# Patient Record
Sex: Male | Born: 1945 | Race: White | Hispanic: No | Smoking: Former smoker
Health system: Southern US, Community
[De-identification: ages and names within clinical notes are randomized; demographics above are authoritative.]

## PROBLEM LIST (undated history)

## (undated) ENCOUNTER — Emergency Department (HOSPITAL_COMMUNITY): Admission: EM | Disposition: A | Discharge status: Home / Self Care | Payer: Medicare Other

## (undated) DIAGNOSIS — E119 Type 2 diabetes mellitus without complications: Secondary | ICD-10-CM

## (undated) DIAGNOSIS — E785 Hyperlipidemia, unspecified: Secondary | ICD-10-CM

## (undated) DIAGNOSIS — R55 Syncope and collapse: Secondary | ICD-10-CM

## (undated) DIAGNOSIS — K219 Gastro-esophageal reflux disease without esophagitis: Secondary | ICD-10-CM

## (undated) DIAGNOSIS — J189 Pneumonia, unspecified organism: Secondary | ICD-10-CM

## (undated) DIAGNOSIS — I495 Sick sinus syndrome: Secondary | ICD-10-CM

## (undated) DIAGNOSIS — M1711 Unilateral primary osteoarthritis, right knee: Secondary | ICD-10-CM

## (undated) DIAGNOSIS — K08109 Complete loss of teeth, unspecified cause, unspecified class: Secondary | ICD-10-CM

## (undated) DIAGNOSIS — Z972 Presence of dental prosthetic device (complete) (partial): Secondary | ICD-10-CM

## (undated) DIAGNOSIS — Z950 Presence of cardiac pacemaker: Secondary | ICD-10-CM

## (undated) DIAGNOSIS — Z8719 Personal history of other diseases of the digestive system: Secondary | ICD-10-CM

## (undated) DIAGNOSIS — I6529 Occlusion and stenosis of unspecified carotid artery: Secondary | ICD-10-CM

## (undated) DIAGNOSIS — I480 Paroxysmal atrial fibrillation: Secondary | ICD-10-CM

## (undated) DIAGNOSIS — Z412 Encounter for routine and ritual male circumcision: Secondary | ICD-10-CM

## (undated) DIAGNOSIS — K625 Hemorrhage of anus and rectum: Secondary | ICD-10-CM

## (undated) DIAGNOSIS — I4510 Unspecified right bundle-branch block: Secondary | ICD-10-CM

## (undated) DIAGNOSIS — I10 Essential (primary) hypertension: Secondary | ICD-10-CM

## (undated) DIAGNOSIS — M1990 Unspecified osteoarthritis, unspecified site: Secondary | ICD-10-CM

## (undated) DIAGNOSIS — I509 Heart failure, unspecified: Secondary | ICD-10-CM

## (undated) DIAGNOSIS — G4733 Obstructive sleep apnea (adult) (pediatric): Secondary | ICD-10-CM

## (undated) DIAGNOSIS — E039 Hypothyroidism, unspecified: Secondary | ICD-10-CM

## (undated) DIAGNOSIS — J449 Chronic obstructive pulmonary disease, unspecified: Secondary | ICD-10-CM

## (undated) DIAGNOSIS — I359 Nonrheumatic aortic valve disorder, unspecified: Secondary | ICD-10-CM

## (undated) DIAGNOSIS — I079 Rheumatic tricuspid valve disease, unspecified: Secondary | ICD-10-CM

## (undated) DIAGNOSIS — C801 Malignant (primary) neoplasm, unspecified: Secondary | ICD-10-CM

## (undated) HISTORY — PX: OTHER SURGICAL HISTORY: SHX169

## (undated) HISTORY — PX: HAND TENDON SURGERY: SHX663

## (undated) HISTORY — PX: CARDIAC CATHETERIZATION: SHX172

## (undated) HISTORY — DX: Sick sinus syndrome: I49.5

## (undated) HISTORY — DX: Encounter for routine and ritual male circumcision: Z41.2

## (undated) HISTORY — DX: Hyperlipidemia, unspecified: E78.5

## (undated) HISTORY — DX: Unspecified right bundle-branch block: I45.10

## (undated) HISTORY — DX: Obstructive sleep apnea (adult) (pediatric): G47.33

## (undated) HISTORY — DX: Nonrheumatic aortic valve disorder, unspecified: I35.9

## (undated) HISTORY — PX: TONSILLECTOMY: SUR1361

## (undated) HISTORY — DX: Hemorrhage of anus and rectum: K62.5

## (undated) HISTORY — PX: CIRCUMCISION: SUR203

## (undated) HISTORY — DX: Occlusion and stenosis of unspecified carotid artery: I65.29

## (undated) HISTORY — DX: Rheumatic tricuspid valve disease, unspecified: I07.9

## (undated) HISTORY — DX: Syncope and collapse: R55

## (undated) HISTORY — PX: EYE SURGERY: SHX253

## (undated) HISTORY — PX: A-V CARDIAC PACEMAKER INSERTION: SHX562

---

## 1992-12-29 HISTORY — PX: CHOLECYSTECTOMY: SHX55

## 1992-12-29 HISTORY — PX: CARPAL TUNNEL RELEASE: SHX101

## 1999-12-30 HISTORY — PX: OTHER SURGICAL HISTORY: SHX169

## 2001-12-29 HISTORY — PX: HEMORROIDECTOMY: SUR656

## 2003-12-30 HISTORY — PX: OTHER SURGICAL HISTORY: SHX169

## 2006-12-01 HISTORY — PX: GALLBLADDER SURGERY: SHX652

## 2008-09-28 HISTORY — PX: CARDIAC ELECTROPHYSIOLOGY STUDY AND ABLATION: SHX1294

## 2010-12-29 HISTORY — PX: EYE SURGERY: SHX253

## 2011-01-24 ENCOUNTER — Ambulatory Visit
Admission: RE | Admit: 2011-01-24 | Discharge: 2011-01-24 | Payer: Self-pay | Source: Home / Self Care | Attending: Internal Medicine | Admitting: Internal Medicine

## 2011-01-24 ENCOUNTER — Encounter: Payer: Self-pay | Admitting: Internal Medicine

## 2011-01-24 DIAGNOSIS — Z950 Presence of cardiac pacemaker: Secondary | ICD-10-CM | POA: Insufficient documentation

## 2011-01-24 DIAGNOSIS — I493 Ventricular premature depolarization: Secondary | ICD-10-CM | POA: Insufficient documentation

## 2011-01-24 DIAGNOSIS — I10 Essential (primary) hypertension: Secondary | ICD-10-CM | POA: Insufficient documentation

## 2011-01-30 NOTE — Letter (Signed)
Summary: records from Dr Ouida Sills  records from Dr Ouida Sills   Imported By: Faythe Ghee 01/24/2011 16:10:23  _____________________________________________________________________  External Attachment:    Type:   Image     Comment:   External Document

## 2011-01-30 NOTE — Assessment & Plan Note (Signed)
Summary: **PT NEW TO ARE[place] FROM [place] / NEEDS PACER CHECKED/TG   Visit Type:  Initial Consult Primary Provider:  Osborne Casco   History of Present Illness: Mr. Gunby is referred today by Dr. Ouida Sills for ongoing followup of symptomatic tachybrady, s/p PPM, RVOT PVC's s/p ablation and HTN. He also has dyslipidemia.  The patient also has copd. He has moved from [place] to [place]. He has done quite well.  Current Medications (verified): 1)  Norvasc 10 Mg Tabs (Amlodipine Besylate) .... Take 1 Tab Daily 2)  Synthroid 175 Mcg Tabs (Levothyroxine Sodium) .... Take 1 Tab Daily 3)  Metoprolol Tartrate 100 Mg Tabs (Metoprolol Tartrate) .... Take 1 Tab Two Times A Day 4)  Vytorin 10-80 Mg Tabs (Ezetimibe-Simvastatin) .... Take 1 Tab Daily 5)  Metformin Hcl 500 Mg Tabs (Metformin Hcl) .... Take2 Tab Two Times A Day 6)  Contour Test Strips 7)  Advair Diskus 250-50 Mcg/dose Aepb (Fluticasone-Salmeterol) .... Use Daily 8)  Demadex 20 Mg Tabs (Torsemide) .... Take 1 Tab Daily 9)  Starlix 120 Mg Tabs (Nateglinide) .... Take 1 Tab Two Times A Day 10)  Aspir-Low 81 Mg Tbec (Aspirin) .... Take 1 Tab Daily 11)  Daily Multi  Tabs (Multiple Vitamins-Minerals) .... Take 1 Tab Daily 12)  Aleve 220 Mg Tabs (Naproxen Sodium) .... Take  2 Am 2 Pm 13)  Tylenol 325 Mg Tabs (Acetaminophen) .... Take As Needed  Allergies (verified): 1)  ! Iodine 2)  ! * Bananas 3)  ! 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    Vital Signs:  Patient profile:   65 year old male Height:      71 inches Weight:      247 pounds BMI:     34.57 Pulse rate:   60 / minute BP sitting:   176 / 82  (left arm)  Vitals Entered By: Dreama Saa, CNA (January 24, 2011 8:17 AM)  Physical Exam  General:  Well developed, well nourished, in no acute distress.  HEENT: normal Neck: supple. No JVD. Carotids 2+ bilaterally no bruits Cor: RRR no rubs, gallops or murmur Lungs: CTA. Well healed PPM incision. Ab: soft, nontender. nondistended. No HSM. Good bowel sounds Ext: warm. no cyanosis, clubbing or edema Neuro: alert and oriented. Grossly nonfocal. affect pleasant    PPM Specifications Following MD:  Lewayne Bunting, MD     PPM Vendor:  [employer]     PPM Model Number:  [id number]     PPM Serial Number:  [id number] PPM DOI:  11/03/2004     PPM Implanting MD:  NOT IMPLANTED BY [place]  Lead 1    Location: [facility]     DOI: 11/03/2004     Model #: [id number]     Serial #: [id number]     Status: active Lead 2  Location: [facility]     DOI: 11/03/2004     Model #: [id number]     Serial #: [id number]     Status: active  Magnet Response Rate:  BOL 98.6 ERI 86.3  Indications:  Sick sinus syndrome   PPM Follow Up Remote Check?  No Battery Voltage:  2.74 V     Battery Est. Longevity:  1.25 years     Pacer Dependent:  No       PPM Device Measurements Atrium  Amplitude: 1.3 mV, Impedance: 364 ohms, Threshold: 0.75 V at 0.5 msec Right Ventricle  Amplitude: 4.8 mV, Impedance: 398 ohms, Threshold: 0.625 V at 0.5 msec  Episodes MS Episodes:  0     Percent Mode Switch:  0     Coumadin:  No Atrial Pacing:  84%      Ventricular Pacing:  30%  Parameters Mode:  DDDR     Lower Rate Limit:  60     Upper Rate Limit:  110 Paced AV Delay:  275     Sensed AV Delay:  250 Rate Response Parameters:  Auto -0.5 Next Cardiology Appt Due:  06/29/2011 Tech Comments:  Ventricular autocapture and rate response reprogrammed today.  We will set up TTM's with [employer].  ROV 6 months RDS clinic. Altha Harm, LPN  January 24, 2011 8:43 AM  MD Comments:  Agree with above.  Impression & Recommendations:  Problem # 1:  CARDIAC PACEMAKER IN SITU (ICD-V45.01) HIs current device is working normally. Will recheck in several months.  Problem # 2:  ESSENTIAL HYPERTENSION, BENIGN (ICD-401.1) His pressure is elevated. He has not yet taken his morning meds. I have asked him to maintain a low sodium diet. His updated medication list for this problem includes:    Norvasc 10 Mg Tabs (Amlodipine besylate) .Marland Kitchen... Take 1 tab daily    Metoprolol Tartrate 100 Mg Tabs (Metoprolol tartrate) .Marland Kitchen... Take 1 tab two times a day    Demadex 20 Mg Tabs (Torsemide) .Marland Kitchen... Take 1 tab daily    Aspir-low 81 Mg Tbec (Aspirin) .Marland Kitchen... Take 1 tab daily  Problem # 3:  PREMATURE VENTRICULAR CONTRACTIONS (ICD-427.69) He is s/p ablation. Interogation of his PPM demonstrated less than one percent pvc's. Will follow. His updated medication list for this problem includes:    Norvasc 10 Mg Tabs (Amlodipine besylate) .Marland Kitchen... Take 1 tab daily    Metoprolol Tartrate 100 Mg Tabs (Metoprolol tartrate) .Marland Kitchen... Take 1 tab two times a day    Aspir-low 81 Mg Tbec (Aspirin) .Marland Kitchen... Take 1 tab daily  Patient Instructions: 1)  Your physician recommends that you schedule a follow-up appointment in: 6 months 2)  Your physician recommends that you continue on your current medications as directed. Please refer to the Current Medication list given to you today.

## 2011-04-16 ENCOUNTER — Ambulatory Visit
Admission: RE | Admit: 2011-04-16 | Discharge: 2011-04-16 | Disposition: A | Discharge status: Home / Self Care | Payer: Medicare Other | Source: Ambulatory Visit | Attending: Orthopedic Surgery | Admitting: Orthopedic Surgery

## 2011-04-16 ENCOUNTER — Other Ambulatory Visit: Payer: Self-pay | Admitting: Orthopedic Surgery

## 2011-04-16 ENCOUNTER — Encounter (HOSPITAL_BASED_OUTPATIENT_CLINIC_OR_DEPARTMENT_OTHER)
Admission: RE | Admit: 2011-04-16 | Discharge: 2011-04-16 | Disposition: A | Discharge status: Home / Self Care | Payer: Medicare Other | Source: Ambulatory Visit | Attending: Orthopedic Surgery | Admitting: Orthopedic Surgery

## 2011-04-16 DIAGNOSIS — Z01811 Encounter for preprocedural respiratory examination: Secondary | ICD-10-CM

## 2011-04-16 LAB — BASIC METABOLIC PANEL
CO2: 30 mEq/L (ref 19–32)
Calcium: 9.2 mg/dL (ref 8.4–10.5)
Creatinine, Ser: 1.12 mg/dL (ref 0.4–1.5)
Glucose, Bld: 150 mg/dL — ABNORMAL HIGH (ref 70–99)

## 2011-04-21 ENCOUNTER — Ambulatory Visit (HOSPITAL_BASED_OUTPATIENT_CLINIC_OR_DEPARTMENT_OTHER)
Admission: RE | Admit: 2011-04-21 | Discharge: 2011-04-21 | Disposition: A | Discharge status: Home / Self Care | Payer: Medicare Other | Source: Ambulatory Visit | Attending: Orthopedic Surgery | Admitting: Orthopedic Surgery

## 2011-04-21 DIAGNOSIS — J45909 Unspecified asthma, uncomplicated: Secondary | ICD-10-CM | POA: Insufficient documentation

## 2011-04-21 DIAGNOSIS — Z01812 Encounter for preprocedural laboratory examination: Secondary | ICD-10-CM | POA: Insufficient documentation

## 2011-04-21 DIAGNOSIS — E119 Type 2 diabetes mellitus without complications: Secondary | ICD-10-CM | POA: Insufficient documentation

## 2011-04-21 DIAGNOSIS — M23329 Other meniscus derangements, posterior horn of medial meniscus, unspecified knee: Secondary | ICD-10-CM | POA: Insufficient documentation

## 2011-04-21 DIAGNOSIS — M659 Unspecified synovitis and tenosynovitis, unspecified site: Secondary | ICD-10-CM | POA: Insufficient documentation

## 2011-04-21 DIAGNOSIS — M23359 Other meniscus derangements, posterior horn of lateral meniscus, unspecified knee: Secondary | ICD-10-CM | POA: Insufficient documentation

## 2011-04-21 DIAGNOSIS — M2240 Chondromalacia patellae, unspecified knee: Secondary | ICD-10-CM | POA: Insufficient documentation

## 2011-04-21 DIAGNOSIS — I4891 Unspecified atrial fibrillation: Secondary | ICD-10-CM | POA: Insufficient documentation

## 2011-04-21 DIAGNOSIS — I10 Essential (primary) hypertension: Secondary | ICD-10-CM | POA: Insufficient documentation

## 2011-04-29 ENCOUNTER — Other Ambulatory Visit (HOSPITAL_COMMUNITY): Payer: Self-pay | Admitting: Internal Medicine

## 2011-04-29 DIAGNOSIS — R0989 Other specified symptoms and signs involving the circulatory and respiratory systems: Secondary | ICD-10-CM

## 2011-04-30 ENCOUNTER — Telehealth: Payer: Self-pay | Admitting: Internal Medicine

## 2011-04-30 NOTE — Telephone Encounter (Signed)
Pt was told at last visit with Ladona Ridgel that someone would be setting him up to do phone checks. Pt calling to see what happened with that.

## 2011-04-30 NOTE — Telephone Encounter (Signed)
Patient enrolled with [employer] for TTM's to be done quarterly.  Spoke with wife and asked her to contact us if he does not get a schedule of dates within the next 2 weeks.

## 2011-05-01 ENCOUNTER — Ambulatory Visit: Payer: Medicare Other | Admitting: Adult Health

## 2011-05-05 ENCOUNTER — Ambulatory Visit (HOSPITAL_COMMUNITY)
Admission: RE | Admit: 2011-05-05 | Discharge: 2011-05-05 | Disposition: A | Discharge status: Home / Self Care | Payer: Medicare Other | Source: Ambulatory Visit | Attending: Internal Medicine | Admitting: Internal Medicine

## 2011-05-05 DIAGNOSIS — R0989 Other specified symptoms and signs involving the circulatory and respiratory systems: Secondary | ICD-10-CM | POA: Insufficient documentation

## 2011-05-20 NOTE — Op Note (Signed)
NAME:  [name], Clarence Dawson NO.:  [id number]  MEDICAL RECORD NO.:  [id number]           PATIENT TYPE:  LOCATION:                                 FACILITY:  PHYSICIAN:  Elana Alm. Thurston Hole, M.D.      DATE OF BIRTH:  DATE OF PROCEDURE:  04/21/2011 DATE OF DISCHARGE:                              OPERATIVE REPORT   PREOPERATIVE DIAGNOSIS:  Left knee medial and lateral meniscal tears with chondromalacia and synovitis.  POSTOPERATIVE DIAGNOSIS:  Left knee medial and lateral meniscal tears with chondromalacia and synovitis.  PROCEDURES: 1. Left knee examination under anesthesia followed by arthroscopic     partial medial and lateral meniscectomies. 2. Left knee chondroplasty with partial synovectomy.  SURGEON:  Elana Alm. Thurston Hole, MD  ASSISTANT:  None.  ANESTHESIA:  General.  OPERATIVE TIME:  30 minutes.  COMPLICATIONS:  None.  INDICATIONS FOR PROCEDURE:  [name] is a 64 year old gentleman who has had significant left knee pain with a twisting injury to his left knee that occurred when he got out of his truck in January 2012.  Since that time, he has had significant pain with exam and x-rays consistent with medial meniscus tear.  He has failed conservative care and is now to undergo arthroscopy.  DESCRIPTION:  [name] was brought to the operating room on April 21, 2011, after knee block was placed in the holding room by Anesthesia.  He was placed on the operating table in supine position.  He received Ancef 1 g IV preoperatively for prophylaxis.  His left knee was examined under anesthesia.  He had full range of motion of knee with stable ligamentous exam with normal patellar tracking.  Left leg was prepped using sterile DuraPrep and draped using sterile technique.  Originally, through an anterolateral portal, the arthroscope with a pump attached was placed into an anteromedial portal and arthroscopic probe was placed.  On initial inspection of medial  compartment, he had a distinct 50-60% grade 3 chondral lesion on the medial femoral condyle which was debrided down to a very thin base and a stable rim.  Medial tibial plateau showed grade 1 and 2 changes.  He had a complex tear of the posterior medial horn of the medial meniscus of which 50% was resected back to a stable rim.  Anterior horn was intact.  Intercondylar notch was inspected. Anteroposterior cruciate ligaments were normal.  Lateral compartment was inspected.  He had grade 1 and 2 chondromalacia, lateral meniscus tear, posterior and lateral horn of which 25% was resected back to a stable rim.  Patellofemoral joint showed grade 1 and 2 chondromalacia.  The patella tracked normally.  Moderate synovitis and medial and lateral gutters were debrided, otherwise this was free of pathology.  After this was done, it was felt that all pathology had been satisfactorily addressed.  The instruments were removed.  Portals were closed with 3-0 nylon suture.  Sterile dressings were applied and the patient awakened and taken to the recovery room in stable condition.  FOLLOWUP CARE:  Mr. Zeiner will be followed as an outpatient on Norco for pain.  He will be  seen back in the office in a week for sutures out and followup.     Dareld Mcauliffe A. Thurston Hole, M.D.     RAW/MEDQ  D:  04/21/2011  T:  04/22/2011  Job:  [id number]  Electronically Signed by Salvatore Marvel M.D. on 05/20/2011 05:19:30 PM

## 2011-06-04 ENCOUNTER — Encounter: Payer: Self-pay | Admitting: Internal Medicine

## 2011-06-04 DIAGNOSIS — I495 Sick sinus syndrome: Secondary | ICD-10-CM

## 2011-07-10 ENCOUNTER — Encounter: Payer: Self-pay | Admitting: Internal Medicine

## 2011-07-17 ENCOUNTER — Encounter: Payer: Self-pay | Admitting: Internal Medicine

## 2011-07-17 ENCOUNTER — Ambulatory Visit (INDEPENDENT_AMBULATORY_CARE_PROVIDER_SITE_OTHER): Payer: Medicare Other | Admitting: Internal Medicine

## 2011-07-17 DIAGNOSIS — I498 Other specified cardiac arrhythmias: Secondary | ICD-10-CM

## 2011-07-17 DIAGNOSIS — Z950 Presence of cardiac pacemaker: Secondary | ICD-10-CM

## 2011-07-17 DIAGNOSIS — I4949 Other premature depolarization: Secondary | ICD-10-CM

## 2011-07-17 DIAGNOSIS — I10 Essential (primary) hypertension: Secondary | ICD-10-CM

## 2011-07-17 DIAGNOSIS — I493 Ventricular premature depolarization: Secondary | ICD-10-CM

## 2011-07-17 LAB — PACEMAKER DEVICE OBSERVATION
AL AMPLITUDE: 1.4 mv
ATRIAL PACING PM: 83
BAMS-0001: 180 {beats}/min
BATTERY VOLTAGE: 2.75 V
RV LEAD AMPLITUDE: 6.5 mv
VENTRICULAR PACING PM: 1

## 2011-07-17 NOTE — Progress Notes (Signed)
HPI [name] returns today for followup. He is a very pleasant 65 year old man from [place] who has a history of symptomatic bradycardia and is status post permanent pacemaker insertion. He has a history of symptomatic PVCs and underwent catheter ablation several years ago. He continues to do well. He denies chest pain, shortness of breath, or peripheral edema. He denies syncope. Allergies  Allergen Reactions  . Iodine      Current Outpatient Prescriptions  Medication Sig Dispense Refill  . acetaminophen (TYLENOL) 325 MG tablet Take 650 mg by mouth every 6 (six) hours as needed.        Marland Kitchen amLODipine (NORVASC) 10 MG tablet Take 10 mg by mouth daily.        Marland Kitchen aspirin 81 MG tablet Take 81 mg by mouth daily.        Marland Kitchen ezetimibe-simvastatin (VYTORIN) 10-80 MG per tablet Take 1 tablet by mouth at bedtime.        . Fluticasone-Salmeterol (ADVAIR DISKUS) 250-50 MCG/DOSE AEPB Inhale 1 puff into the lungs every 12 (twelve) hours.        Marland Kitchen glucose blood test strip 1 each by Other route as needed. Use as instructed       . levothyroxine (SYNTHROID, LEVOTHROID) 175 MCG tablet Take 175 mcg by mouth daily.        . metFORMIN (GLUCOPHAGE) 500 MG tablet Take 1,000 mg by mouth 2 (two) times daily with a meal.        . metoprolol (LOPRESSOR) 100 MG tablet Take 100 mg by mouth 2 (two) times daily.        . Multiple Vitamin (MULTIVITAMIN) tablet Take 1 tablet by mouth daily.        . naproxen sodium (ANAPROX) 220 MG tablet Take 2 tablets in the morning and 2 tablets in the evening       . nateglinide (STARLIX) 120 MG tablet Take 120 mg by mouth 2 (two) times daily.        . ramipril (ALTACE) 2.5 MG capsule Take 2.5 mg by mouth daily.       Marland Kitchen torsemide (DEMADEX) 20 MG tablet Take 20 mg by mouth daily.           Past Medical History  Diagnosis Date  . Hypertension   . Allergic rhinitis   . Atrial fibrillation   . Syncope   . Sinoatrial node dysfunction   . Hyperthyroidism   . 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  Hyperlipidemia   . RBBB (right bundle branch block)     Coronary Artherosclerosis  . Tricuspid valve disorder   . OSA (obstructive sleep apnea)   . Aortic valve disorder   . Carotid artery stenosis   . Diabetes mellitus   . Hemorrhage of rectum     ROS:   All systems reviewed and negative except as noted in the HPI.   Past Surgical History  Procedure Date  . Gallbladder surgery 12/01/2006  . Effusion of joint 01/20/2007    Left Elbow  . Rotator cuff surgery 2001    Right shoulder  . Carpal tunnel release 1994    right wrist  . Arthropathy 2005    Rebuilding of left thumb and joint   . Hemorroidectomy 2003  . Circumcision   . A-v cardiac pacemaker insertion     Sick sinus syndrome DDR pacer  . Cardiac electrophysiology study and ablation 09/2008    for pvcs, Dr. Vesta Mixer     Family History  Problem Relation Age  of Onset  . Other Father 76    Sudden Cardiac death  . Pancreatic cancer Mother      History   Social History  . Marital Status: Married    Spouse Name: N/A    Number of Children: N/A  . Years of Education: N/A   Occupational History  . Not on file.   Social History Main Topics  . Smoking status: Former Smoker -- 1.0 packs/day for 25 years    Quit date: 12/30/1983  . Smokeless tobacco: Not on file  . Alcohol Use: Yes     Social  . Drug Use: No  . Sexually Active: Not on file   Other Topics Concern  . Not on file   Social History Narrative   Regular exercise: No     BP 172/81  Pulse 59  Ht 5\' 11"  (1.803 m)  Wt 241 lb (109.317 kg)  BMI 33.61 kg/m2  Physical Exam:  Well appearing NAD HEENT: Unremarkable Neck:  No JVD, no thyromegally Lymphatics:  No adenopathy Back:  No CVA tenderness Lungs:  Clear. Well-healed pacemaker incision. HEART:  Regular rate rhythm, no murmurs, no rubs, no clicks Abd:  soft, positive bowel sounds, no organomegally, no rebound, no guarding Ext:  2 plus pulses, no edema, no cyanosis, no clubbing Skin:   No rashes no nodules Neuro:  CN II through XII intact, motor grossly intact  DEVICE  Normal device function.  See PaceArt for details.   Assess/Plan:

## 2011-07-17 NOTE — Assessment & Plan Note (Signed)
His device is working normally. He is 1-2 years away from generator replacement

## 2011-07-17 NOTE — Assessment & Plan Note (Signed)
His blood pressure was elevated today but he notes that he did not take all of his morning medications. He does check his blood pressure on a regular basis and states it has been well controlled.

## 2011-07-17 NOTE — Assessment & Plan Note (Signed)
His symptoms appear to be well controlled. Will undergo a period of watchful waiting

## 2011-07-17 NOTE — Patient Instructions (Signed)
Your physician recommends that you schedule a follow-up appointment in:1 year Dr. Ladona Ridgel

## 2011-09-04 ENCOUNTER — Encounter: Payer: Self-pay | Admitting: Internal Medicine

## 2011-09-04 DIAGNOSIS — I495 Sick sinus syndrome: Secondary | ICD-10-CM

## 2011-12-04 ENCOUNTER — Encounter: Payer: Self-pay | Admitting: Internal Medicine

## 2011-12-04 DIAGNOSIS — I495 Sick sinus syndrome: Secondary | ICD-10-CM

## 2012-01-21 DIAGNOSIS — Z9889 Other specified postprocedural states: Secondary | ICD-10-CM | POA: Diagnosis not present

## 2012-01-21 DIAGNOSIS — Z947 Corneal transplant status: Secondary | ICD-10-CM | POA: Diagnosis not present

## 2012-03-08 DIAGNOSIS — E039 Hypothyroidism, unspecified: Secondary | ICD-10-CM | POA: Diagnosis not present

## 2012-03-08 DIAGNOSIS — E785 Hyperlipidemia, unspecified: Secondary | ICD-10-CM | POA: Diagnosis not present

## 2012-03-08 DIAGNOSIS — I10 Essential (primary) hypertension: Secondary | ICD-10-CM | POA: Diagnosis not present

## 2012-03-08 DIAGNOSIS — E119 Type 2 diabetes mellitus without complications: Secondary | ICD-10-CM | POA: Diagnosis not present

## 2012-03-10 DIAGNOSIS — Z9889 Other specified postprocedural states: Secondary | ICD-10-CM | POA: Diagnosis not present

## 2012-03-10 DIAGNOSIS — H17819 Minor opacity of cornea, unspecified eye: Secondary | ICD-10-CM | POA: Diagnosis not present

## 2012-03-10 DIAGNOSIS — Z947 Corneal transplant status: Secondary | ICD-10-CM | POA: Diagnosis not present

## 2012-03-11 ENCOUNTER — Encounter: Payer: Self-pay | Admitting: Internal Medicine

## 2012-03-11 DIAGNOSIS — I495 Sick sinus syndrome: Secondary | ICD-10-CM | POA: Diagnosis not present

## 2012-03-16 DIAGNOSIS — I10 Essential (primary) hypertension: Secondary | ICD-10-CM | POA: Diagnosis not present

## 2012-03-16 DIAGNOSIS — E119 Type 2 diabetes mellitus without complications: Secondary | ICD-10-CM | POA: Diagnosis not present

## 2012-03-16 DIAGNOSIS — R21 Rash and other nonspecific skin eruption: Secondary | ICD-10-CM | POA: Diagnosis not present

## 2012-03-16 DIAGNOSIS — E785 Hyperlipidemia, unspecified: Secondary | ICD-10-CM | POA: Diagnosis not present

## 2012-03-22 DIAGNOSIS — G5600 Carpal tunnel syndrome, unspecified upper limb: Secondary | ICD-10-CM | POA: Diagnosis not present

## 2012-03-22 DIAGNOSIS — M19049 Primary osteoarthritis, unspecified hand: Secondary | ICD-10-CM | POA: Diagnosis not present

## 2012-04-05 DIAGNOSIS — G5600 Carpal tunnel syndrome, unspecified upper limb: Secondary | ICD-10-CM | POA: Diagnosis not present

## 2012-04-12 DIAGNOSIS — M1710 Unilateral primary osteoarthritis, unspecified knee: Secondary | ICD-10-CM | POA: Diagnosis not present

## 2012-04-12 DIAGNOSIS — M25569 Pain in unspecified knee: Secondary | ICD-10-CM | POA: Diagnosis not present

## 2012-04-20 DIAGNOSIS — M1710 Unilateral primary osteoarthritis, unspecified knee: Secondary | ICD-10-CM | POA: Diagnosis not present

## 2012-04-26 DIAGNOSIS — G5600 Carpal tunnel syndrome, unspecified upper limb: Secondary | ICD-10-CM | POA: Diagnosis not present

## 2012-04-27 DIAGNOSIS — M1710 Unilateral primary osteoarthritis, unspecified knee: Secondary | ICD-10-CM | POA: Diagnosis not present

## 2012-04-28 ENCOUNTER — Other Ambulatory Visit: Payer: Self-pay | Admitting: Orthopedic Surgery

## 2012-04-30 ENCOUNTER — Encounter (HOSPITAL_BASED_OUTPATIENT_CLINIC_OR_DEPARTMENT_OTHER): Payer: Self-pay | Admitting: *Deleted

## 2012-04-30 NOTE — Progress Notes (Addendum)
Bring all medications. Bring CPAP machine- Pt has not used in 2-3 months. Requested Sleep Study from Dr. Ilean Skill office- pt had done in [place] and place is no longer in business, but chart from [place] at Dr Alonza Smoker. Faxed implanted cardiac device  Sheet to [place]  [facility] for Dr. Ladona Ridgel to fill out. Coming in Monday for BMET and EKG.

## 2012-04-30 NOTE — Progress Notes (Signed)
Dr. Alonza Smoker office called back could not find Sleep Study from [place] in pt's records.

## 2012-05-03 NOTE — Progress Notes (Signed)
Pt coming 30 mins early day of surgery for labs- Bmet and EKG- could not find EKG done at Dr. Lubertha Basque office.

## 2012-05-04 ENCOUNTER — Ambulatory Visit (HOSPITAL_BASED_OUTPATIENT_CLINIC_OR_DEPARTMENT_OTHER)
Admission: RE | Admit: 2012-05-04 | Discharge: 2012-05-04 | Disposition: A | Discharge status: Home / Self Care | Payer: Medicare Other | Source: Ambulatory Visit | Attending: Orthopedic Surgery | Admitting: Orthopedic Surgery

## 2012-05-04 ENCOUNTER — Encounter (HOSPITAL_BASED_OUTPATIENT_CLINIC_OR_DEPARTMENT_OTHER): Payer: Self-pay | Admitting: Anesthesiology

## 2012-05-04 ENCOUNTER — Other Ambulatory Visit: Payer: Self-pay

## 2012-05-04 ENCOUNTER — Ambulatory Visit (HOSPITAL_BASED_OUTPATIENT_CLINIC_OR_DEPARTMENT_OTHER): Payer: Medicare Other | Admitting: Anesthesiology

## 2012-05-04 ENCOUNTER — Encounter (HOSPITAL_BASED_OUTPATIENT_CLINIC_OR_DEPARTMENT_OTHER)
Admission: RE | Disposition: A | Discharge status: Home / Self Care | Payer: Self-pay | Source: Ambulatory Visit | Attending: Orthopedic Surgery

## 2012-05-04 ENCOUNTER — Encounter (HOSPITAL_BASED_OUTPATIENT_CLINIC_OR_DEPARTMENT_OTHER): Payer: Self-pay | Admitting: Orthopedic Surgery

## 2012-05-04 DIAGNOSIS — G5600 Carpal tunnel syndrome, unspecified upper limb: Secondary | ICD-10-CM | POA: Insufficient documentation

## 2012-05-04 DIAGNOSIS — G4733 Obstructive sleep apnea (adult) (pediatric): Secondary | ICD-10-CM | POA: Insufficient documentation

## 2012-05-04 DIAGNOSIS — E119 Type 2 diabetes mellitus without complications: Secondary | ICD-10-CM | POA: Diagnosis not present

## 2012-05-04 DIAGNOSIS — I10 Essential (primary) hypertension: Secondary | ICD-10-CM | POA: Diagnosis not present

## 2012-05-04 DIAGNOSIS — K219 Gastro-esophageal reflux disease without esophagitis: Secondary | ICD-10-CM | POA: Insufficient documentation

## 2012-05-04 HISTORY — DX: Presence of cardiac pacemaker: Z95.0

## 2012-05-04 HISTORY — PX: CARPAL TUNNEL RELEASE: SHX101

## 2012-05-04 LAB — POCT I-STAT, CHEM 8
Calcium, Ion: 1.08 mmol/L — ABNORMAL LOW (ref 1.12–1.32)
Chloride: 108 mEq/L (ref 96–112)
Glucose, Bld: 140 mg/dL — ABNORMAL HIGH (ref 70–99)
HCT: 48 % (ref 39.0–52.0)
Hemoglobin: 16.3 g/dL (ref 13.0–17.0)
Potassium: 3.8 mEq/L (ref 3.5–5.1)

## 2012-05-04 LAB — GLUCOSE, CAPILLARY: Glucose-Capillary: 137 mg/dL — ABNORMAL HIGH (ref 70–99)

## 2012-05-04 SURGERY — CARPAL TUNNEL RELEASE
Anesthesia: Monitor Anesthesia Care | Site: Hand | Laterality: Left | Wound class: Clean

## 2012-05-04 MED ORDER — CEFAZOLIN SODIUM 1-5 GM-% IV SOLN
INTRAVENOUS | Status: DC | PRN
  Administered 2012-05-04: 2 g via INTRAVENOUS

## 2012-05-04 MED ORDER — MIDAZOLAM HCL 5 MG/5ML IJ SOLN
INTRAMUSCULAR | Status: DC | PRN
  Administered 2012-05-04: 2 mg via INTRAVENOUS

## 2012-05-04 MED ORDER — BUPIVACAINE HCL (PF) 0.25 % IJ SOLN
INTRAMUSCULAR | Status: DC | PRN
  Administered 2012-05-04: 6 mL

## 2012-05-04 MED ORDER — FENTANYL CITRATE 0.05 MG/ML IJ SOLN
INTRAMUSCULAR | Status: DC | PRN
  Administered 2012-05-04: 100 ug via INTRAVENOUS

## 2012-05-04 MED ORDER — CHLORHEXIDINE GLUCONATE 4 % EX LIQD: 60.0000 mL | Freq: Once | CUTANEOUS | Status: DC

## 2012-05-04 MED ORDER — FENTANYL CITRATE 0.05 MG/ML IJ SOLN: 25.0000 ug | INTRAMUSCULAR | Status: DC | PRN

## 2012-05-04 MED ORDER — LIDOCAINE HCL (CARDIAC) 20 MG/ML IV SOLN
INTRAVENOUS | Status: DC | PRN
  Administered 2012-05-04: 30 mg via INTRAVENOUS

## 2012-05-04 MED ORDER — LACTATED RINGERS IV SOLN
INTRAVENOUS | Status: DC
  Administered 2012-05-04: 08:00:00 via INTRAVENOUS

## 2012-05-04 MED ORDER — ONDANSETRON HCL 4 MG/2ML IJ SOLN
INTRAMUSCULAR | Status: DC | PRN
  Administered 2012-05-04: 4 mg via INTRAVENOUS

## 2012-05-04 MED ORDER — HYDROCODONE-ACETAMINOPHEN 5-500 MG PO TABS: 1.0000 | ORAL_TABLET | ORAL | Status: AC | PRN

## 2012-05-04 MED ORDER — PROPOFOL 10 MG/ML IV EMUL
INTRAVENOUS | Status: DC | PRN
  Administered 2012-05-04: 100 ug/kg/min via INTRAVENOUS

## 2012-05-04 SURGICAL SUPPLY — 36 items
BANDAGE GAUZE ELAST BULKY 4 IN (GAUZE/BANDAGES/DRESSINGS) ×2 IMPLANT
BLADE SURG 15 STRL LF DISP TIS (BLADE) ×1 IMPLANT
BLADE SURG 15 STRL SS (BLADE) ×1
BNDG COHESIVE 3X5 TAN STRL LF (GAUZE/BANDAGES/DRESSINGS) ×2 IMPLANT
BNDG ESMARK 4X9 LF (GAUZE/BANDAGES/DRESSINGS) IMPLANT
CHLORAPREP W/TINT 26ML (MISCELLANEOUS) ×2 IMPLANT
CLOTH BEACON ORANGE TIMEOUT ST (SAFETY) ×2 IMPLANT
CORDS BIPOLAR (ELECTRODE) ×2 IMPLANT
COVER MAYO STAND STRL (DRAPES) ×2 IMPLANT
COVER TABLE BACK 60X90 (DRAPES) ×2 IMPLANT
CUFF TOURNIQUET SINGLE 18IN (TOURNIQUET CUFF) ×2 IMPLANT
DRAPE EXTREMITY T 121X128X90 (DRAPE) ×2 IMPLANT
DRAPE SURG 17X23 STRL (DRAPES) ×2 IMPLANT
DRSG KUZMA FLUFF (GAUZE/BANDAGES/DRESSINGS) ×2 IMPLANT
GAUZE XEROFORM 1X8 LF (GAUZE/BANDAGES/DRESSINGS) ×2 IMPLANT
GLOVE BIO SURGEON STRL SZ 6.5 (GLOVE) ×2 IMPLANT
GLOVE BIO SURGEON STRL SZ7 (GLOVE) ×2 IMPLANT
GLOVE BIO SURGEON STRL SZ7.5 (GLOVE) ×2 IMPLANT
GLOVE SURG ORTHO 8.0 STRL STRW (GLOVE) ×2 IMPLANT
GOWN BRE IMP PREV XXLGXLNG (GOWN DISPOSABLE) ×2 IMPLANT
GOWN PREVENTION PLUS XLARGE (GOWN DISPOSABLE) ×2 IMPLANT
NEEDLE 27GAX1X1/2 (NEEDLE) ×2 IMPLANT
NS IRRIG 1000ML POUR BTL (IV SOLUTION) ×2 IMPLANT
PACK BASIN DAY SURGERY FS (CUSTOM PROCEDURE TRAY) ×2 IMPLANT
PAD CAST 3X4 CTTN HI CHSV (CAST SUPPLIES) ×1 IMPLANT
PADDING CAST ABS 4INX4YD NS (CAST SUPPLIES) ×1
PADDING CAST ABS COTTON 4X4 ST (CAST SUPPLIES) ×1 IMPLANT
PADDING CAST COTTON 3X4 STRL (CAST SUPPLIES) ×1
SPONGE GAUZE 4X4 12PLY (GAUZE/BANDAGES/DRESSINGS) ×2 IMPLANT
STOCKINETTE 4X48 STRL (DRAPES) ×2 IMPLANT
SUT VICRYL 4-0 PS2 18IN ABS (SUTURE) IMPLANT
SUT VICRYL RAPIDE 4/0 PS 2 (SUTURE) ×2 IMPLANT
SYR BULB 3OZ (MISCELLANEOUS) ×2 IMPLANT
SYR CONTROL 10ML LL (SYRINGE) ×2 IMPLANT
TOWEL OR 17X24 6PK STRL BLUE (TOWEL DISPOSABLE) ×2 IMPLANT
UNDERPAD 30X30 INCONTINENT (UNDERPADS AND DIAPERS) ×2 IMPLANT

## 2012-05-04 NOTE — Brief Op Note (Signed)
05/04/2012  10:02 AM  PATIENT:  Clarence Dawson  66 y.o. male  PRE-OPERATIVE DIAGNOSIS:  left carpal tunnel syndrome  POST-OPERATIVE DIAGNOSIS:  left carpal tunnel syndrome  PROCEDURE:  Procedure(s) (LRB): CARPAL TUNNEL RELEASE (Left)  SURGEON:  Surgeon(s) and Role:    * Nicki Reaper, MD - Primary    * Tami Ribas, MD - Assisting  PHYSICIAN ASSISTANT:   ASSISTANTS: Karlyn Agee MD   ANESTHESIA:   local and regional  EBL:  Total I/O In: 700 [I.V.:700] Out: -   BLOOD ADMINISTERED:none  DRAINS: none   LOCAL MEDICATIONS USED:  MARCAINE     SPECIMEN:  No Specimen  DISPOSITION OF SPECIMEN:  N/A  COUNTS:  YES  TOURNIQUET:   Total Tourniquet Time Documented: Forearm (laterality) - 20 minutes  DICTATION: .Other Dictation: Dictation Number [id number]  PLAN OF CARE: Discharge to home after PACU  PATIENT DISPOSITION:  PACU - hemodynamically stable.

## 2012-05-04 NOTE — Progress Notes (Signed)
Pt states he has shellfish allergy.

## 2012-05-04 NOTE — Anesthesia Postprocedure Evaluation (Signed)
  Anesthesia Post-op Note  Patient: Clarence Dawson  Procedure(s) Performed: Procedure(s) (LRB): CARPAL TUNNEL RELEASE (Left)  Patient Location: PACU  Anesthesia Type: Bier block  Level of Consciousness: awake, alert  and oriented  Airway and Oxygen Therapy: Patient Spontanous Breathing  Post-op Pain: none  Post-op Assessment: Post-op Vital signs reviewed, Patient's Cardiovascular Status Stable, Respiratory Function Stable, Patent Airway, No signs of Nausea or vomiting and Pain level controlled  Post-op Vital Signs: Reviewed and stable  Complications: No apparent anesthesia complications

## 2012-05-04 NOTE — Anesthesia Preprocedure Evaluation (Signed)
Anesthesia Evaluation  Patient identified by MRN, date of birth, ID band Patient awake    Reviewed: Allergy & Precautions, H&P , NPO status , Patient's Chart, lab work & pertinent test results, reviewed documented beta blocker date and time   History of Anesthesia Complications Negative for: history of anesthetic complications  Airway Mallampati: I TM Distance: >3 FB Neck ROM: Full    Dental  (+) Edentulous Upper and Edentulous Lower   Pulmonary asthma (well controlled) , sleep apnea (does not use his CPAP) ,  breath sounds clear to auscultation  Pulmonary exam normal       Cardiovascular hypertension, + dysrhythmias (S/P ablation for PVCs ) + pacemaker (for tachy-brady) Rhythm:Regular Rate:Normal  Cath '05: minimal non-obstructive ASCADz, EF 60% with normal LVF   Neuro/Psych negative neurological ROS     GI/Hepatic Neg liver ROS, GERD-  Medicated and Controlled,  Endo/Other  Diabetes mellitus- (glu 140), Well Controlled, Type 2, Oral Hypoglycemic AgentsMorbid obesity  Renal/GU negative Renal ROS     Musculoskeletal   Abdominal (+) + obese,   Peds  Hematology   Anesthesia Other Findings   Reproductive/Obstetrics                           Anesthesia Physical Anesthesia Plan  ASA: III  Anesthesia Plan: MAC and Bier Block   Post-op Pain Management:    Induction:   Airway Management Planned: Simple Face Mask  Additional Equipment:   Intra-op Plan:   Post-operative Plan:   Informed Consent: I have reviewed the patients History and Physical, chart, labs and discussed the procedure including the risks, benefits and alternatives for the proposed anesthesia with the patient or authorized representative who has indicated his/her understanding and acceptance.     Plan Discussed with: CRNA and Surgeon  Anesthesia Plan Comments: (Plan routine monitors, IV Regional )        Anesthesia  Quick Evaluation

## 2012-05-04 NOTE — Anesthesia Procedure Notes (Signed)
Procedure Name: MAC Date/Time: 05/04/2012 9:40 AM Performed by: Caren Macadam Pre-anesthesia Checklist: Patient identified, Emergency Drugs available, Suction available and Patient being monitored Patient Re-evaluated:Patient Re-evaluated prior to inductionOxygen Delivery Method: Simple face mask

## 2012-05-04 NOTE — Discharge Instructions (Signed)
[facility] Instructions Hand Surgery  Wound Care: Keep your hand elevated above the level of your heart.  Do not allow it to dangle  by your side.  Keep the dressing dry and do not remove it unless your doctor advises you to do so.  He will usually change it at the time of your post-op visit.  Moving your fingers is advised to stimulate circulation but will depend on the site of your surgery.  If you have a splint applied, your doctor will advise you regarding movement.  Activity: Do not drive or operate machinery today.  Rest today and then you may return to your normal activity and work as indicated by your physician.  Diet:  Drink liquids today or eat a light diet.  You may resume a regular diet tomorrow.    General expectations: Pain for two to three days. Fingers may become slightly swollen.  Call your doctor if any of the following occur: Severe pain not relieved by pain medication. Elevated temperature. Dressing soaked with blood. Inability to move fingers. White or bluish color to fingers.   Call your surgeon if you experience:   1.  Fever over 101.0. 2.  Inability to urinate. 3.  Nausea and/or vomiting. 4.  Extreme swelling or bruising at the surgical site. 5.  Continued bleeding from the incision. 6.  Increased pain, redness or drainage from the incision. 7.  Problems related to your pain medication.   Regional Anesthesia Blocks  1. Numbness or the inability to move the "blocked" extremity may last from 3-48 hours after placement. The length of time depends on the medication injected and your individual response to the medication. If the numbness is not going away after 48 hours, call your surgeon.  2. The extremity that is blocked will need to be protected until the numbness is gone and the  Strength has returned. Because you cannot feel it, you will need to take extra care to avoid injury. Because it may be weak, you may have difficulty moving it or using it. You  may not know what position it is in without looking at it while the block is in effect.  3. For blocks in the legs and feet, returning to weight bearing and walking needs to be done carefully. You will need to wait until the numbness is entirely gone and the strength has returned. You should be able to move your leg and foot normally before you try and bear weight or walk. You will need someone to be with you when you first try to ensure you do not fall and possibly risk injury.  4. Bruising and tenderness at the needle site are common side effects and will resolve in a few days.  5. Persistent numbness or new problems with movement should be communicated to the surgeon or the [facility] [phone] [facility] [phone]).    Post Anesthesia Home Care Instructions  Activity: Get plenty of rest for the remainder of the day. A responsible adult should stay with you for 24 hours following the procedure.  For the next 24 hours, DO NOT: -Drive a car -Advertising copywriter -Drink alcoholic beverages -Take any medication unless instructed by your physician -Make any legal decisions or sign important papers.  Meals: Start with liquid foods such as gelatin or soup. Progress to regular foods as tolerated. Avoid greasy, spicy, heavy foods. If nausea and/or vomiting occur, drink only clear liquids until the nausea and/or vomiting subsides. Call your  physician if vomiting continues.  Special Instructions/Symptoms: Your throat may feel dry or sore from the anesthesia or the breathing tube placed in your throat during surgery. If this causes discomfort, gargle with warm salt water. The discomfort should disappear within 24 hours.

## 2012-05-04 NOTE — H&P (Signed)
Clarence Dawson is a 66 year old right hand dominant male referred by Dr. Carylon Perches for a consultation. He is complaining of bilateral hand pain going to the middle fingers. He had a carpal tunnel release done on his right side 20 years ago. He is complaining primarily of his left side with difficulty doing buttons or picking up small objects and holding on to them. He states shaking his wrist will frequently help. His carpal tunnel release was done by Dr. Ortencia Kick in [place]. He also had a suspensionplasty done on his left thumb. He has had several trigger fingers done but doesn't remember which ones. He has no history of injury to his neck but has a stiff neck. He has a history of diabetes, thyroid problems, arthritis and gout. He complains of an intermittent sharp aching type pain to his fingers. he states this is getting worse. Activity makes it worse along with cold. Heat has helped. He has been taking Meloxicam and Voltaren with some relief.  He has had his nerve conductions done by Dr. Johna Roles revealing carpal tunnel syndrome bilaterally with motor delay of 5.3 on the left and 5.1 on the right.  Sensory delay of 2.7 on the left and 2.6 on the right. He shows no atrophy to the intrinsics at the present time of his thumbs or fingers.  His range of motion is not quite full secondary to his arthritis.  We have discussed with him the possibility of injection to the carpal canal on his left side.    Past Medical History: He is allergic to Voltaren and iodine. He is on the following medications: Aspirin 81 mg, Levothyroxine, Nateglinide, Torsemide, Metformin, Metoprolol, Advair Diskus, Amlodipine, multivitamins, Vytorin, Losartan, Meloxicam, Clonidine, Ketoconazole and Clobetasol Propionate cream. He has had the following surgeries: Lens and cornea transplants, left knee torn meniscus, redo cornea transplant, Map and Zap heart to stop PVC's, cataract removal, pacemaker, thumb joint rebuilt, cholecystectomy,  carpal tunnel release, rotator cuff repair, trigger fingers, and LASIK eye surgery.    Family Medical History: Positive for diabetes, heart disease, high BP and arthritis.  Social History: He does not smoke. He drinks socially. He is married and retired.   Review of Systems: Positive for glasses, contacts, high BP, asthma, depression, sleep disorder, otherwise negative for 14 points. Clarence Dawson is an 66 y.o. male.   Chief Complaint: cts lt HPI: see above  Past Medical History  Diagnosis Date  . Hypertension   . Allergic rhinitis   . Atrial fibrillation   . Syncope   . Sinoatrial node dysfunction   . Hyperthyroidism   . 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 Hyperlipidemia   . RBBB (right bundle branch block)     Coronary Artherosclerosis  . Tricuspid valve disorder   . Aortic valve disorder   . Carotid artery stenosis   . Diabetes mellitus   . Hemorrhage of rectum   . Pacemaker     Oct 2005 in [place].  . Asthma     since childhood- seasonal allergies induced  . OSA (obstructive sleep apnea)     done in [place] 2005- place no longer in business  . Arthritis     ostoearthritis    Past Surgical History  Procedure Date  . Gallbladder surgery 12/01/2006  . Effusion of joint 01/20/2007    Left Elbow  . Rotator cuff surgery 2001    Right shoulder  . Carpal tunnel release 1994    right wrist  . Arthropathy 2005    Rebuilding  of left thumb and joint   . Hemorroidectomy 2003  . Circumcision   . A-v cardiac pacemaker insertion     Sick sinus syndrome DDR pacer  . Cardiac electrophysiology study and ablation 09/2008    for pvcs, Dr. Vesta Mixer  . Eye surgery     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  . Left knee arthroscopy     [facility]    Family History  Problem Relation Age of Onset  . Other Father 25    Sudden Cardiac death  . Pancreatic cancer Mother    Social History:  reports that he quit smoking about 28 years ago. He does not have any smokeless  tobacco history on file. He reports that he drinks about .6 ounces of alcohol per week. He reports that he does not use illicit drugs.  Allergies:  Allergies  Allergen Reactions  . Food Other (See Comments)    Tree nuts- breathing difficulty, seafood- breathing difficulty  . Iodine   . Voltaren (Diclofenac Sodium) Other (See Comments)    Feels like things are crawling on him    No prescriptions prior to admission    No results found for this or any previous visit (from the past 48 hour(s)).  No results found.   Pertinent items are noted in HPI.  Height 5\' 10"  (1.778 m), weight 111.131 kg (245 lb).  General appearance: alert, cooperative and appears stated age Head: Normocephalic, without obvious abnormality Neck: no adenopathy Resp: clear to auscultation bilaterally Cardio: regular rate and rhythm, S1, S2 normal, no murmur, click, rub or gallop GI: soft, non-tender; bowel sounds normal; no masses,  no organomegaly Extremities: extremities normal, atraumatic, no cyanosis or edema Pulses: 2+ and symmetric Skin: Skin color, texture, turgor normal. No rashes or lesions Neurologic: Grossly normal Incision/Wound: na  Assessment/Plan The pre, peri and post op course are discussed along with risks and complications. He is aware there is no guarantee with surgery, possibility of infection, recurrence, injury to arteries, nerves and tendons, incomplete relief of symptoms and dystrophy.  Questions were invited and answered in detail. He would like to proceed. He is scheduled for carpal tunnel release left hand as an outpatient.  Clarence Dawson R 05/04/2012, 4:30 AM

## 2012-05-04 NOTE — Op Note (Signed)
Dictated number: [id number]

## 2012-05-04 NOTE — Transfer of Care (Signed)
Immediate Anesthesia Transfer of Care Note  Patient: Clarence Dawson  Procedure(s) Performed: Procedure(s) (LRB): CARPAL TUNNEL RELEASE (Left)  Patient Location: PACU  Anesthesia Type: MAC and Bier block  Level of Consciousness: awake and alert   Airway & Oxygen Therapy: Patient Spontanous Breathing and Patient connected to face mask oxygen  Post-op Assessment: Report given to PACU RN and Post -op Vital signs reviewed and stable  Post vital signs: Reviewed and stable  Complications: No apparent anesthesia complications

## 2012-05-04 NOTE — Op Note (Signed)
NAME:  ORA, MCNATT NO.:  [id number]  MEDICAL RECORD NO.:  [id number]  LOCATION:                                 FACILITY:  PHYSICIAN:  Cindee Salt, M.D.            DATE OF BIRTH:  DATE OF PROCEDURE:  05/04/2012 DATE OF DISCHARGE:                              OPERATIVE REPORT   PREOPERATIVE DIAGNOSIS:  Carpal tunnel syndrome, left hand.  POSTOPERATIVE DIAGNOSIS:  Carpal tunnel syndrome, left hand.  OPERATION:  Decompression of left median nerve.  SURGEON:  Cindee Salt, M.D.  ASSISTANT:  Betha Loa, MD  ANESTHESIA:  Forearm-based IV regional with local infiltration.  ANESTHESIOLOGIST:  Dr. Jean Rosenthal.  HISTORY:  The patient is a 66 year old male with a history of carpal tunnel syndrome, EMG nerve conductions positive, not responsive to conservative treatment.  He has elected to undergo surgical decompression.  Pre, peri, and postoperative course had been discussed along with risks and complications.  He is aware that there is no guarantee with the surgery, possibility of infection, recurrence, injury to arteries, nerves, tendons, incomplete relief of symptoms, dystrophy. Preoperative area, the patient is seen, the extremity marked by both the patient and surgeon.  Antibiotic given.  PROCEDURE:  The patient was brought to the operating room where a forearm-based IV regional anesthetic was carried out without difficulty. He was prepped using ChloraPrep, supine position, left arm free.  A 3 minutes dry time was allowed.  Time-out taken, confirming the patient, procedure.  Longitudinal incision was made in the skin, carried down through subcutaneous tissue.  This was in the central aspect of the palm, bleeders electrocauterized with bipolar.  The palmar fascia split, superficial palmar arch identified.  The flexor tendon to the ring little finger identified to the ulnar side of the median nerve.  The carpal retinaculum was incised with sharp dissection.   Right angle and Sewall retractor placed between skin and forearm fascia.  The fascia released for approximately a cm and half proximal to the wrist crease under direct vision.  Canal was explored.  Area compression to the nerve was apparent with the nerve hyperemia hourglass deformity.  No further lesions were identified.  The wound was irrigated.  Skin closed with interrupted 4-0 Vicryl Rapide sutures.  Local infiltration with 0.25% Marcaine without epinephrine was given, 6 mL was used.  Sterile compressive dressing was applied with the fingers free.  On deflation of the tourniquet, all fingers immediately pinked.  He was taken to the recovery room for observation in satisfactory condition.  He will be discharged home to return in 1 week.          ______________________________ Cindee Salt, M.D.     GK/MEDQ  D:  05/04/2012  T:  05/04/2012  Job:  [id number]

## 2012-05-05 ENCOUNTER — Encounter (HOSPITAL_BASED_OUTPATIENT_CLINIC_OR_DEPARTMENT_OTHER): Payer: Self-pay | Admitting: Orthopedic Surgery

## 2012-05-12 DIAGNOSIS — Z09 Encounter for follow-up examination after completed treatment for conditions other than malignant neoplasm: Secondary | ICD-10-CM | POA: Diagnosis not present

## 2012-05-12 DIAGNOSIS — Z9889 Other specified postprocedural states: Secondary | ICD-10-CM | POA: Diagnosis not present

## 2012-05-12 DIAGNOSIS — Z9849 Cataract extraction status, unspecified eye: Secondary | ICD-10-CM | POA: Diagnosis not present

## 2012-05-12 DIAGNOSIS — Z48298 Encounter for aftercare following other organ transplant: Secondary | ICD-10-CM | POA: Diagnosis not present

## 2012-05-12 DIAGNOSIS — Z961 Presence of intraocular lens: Secondary | ICD-10-CM | POA: Diagnosis not present

## 2012-05-12 DIAGNOSIS — Z947 Corneal transplant status: Secondary | ICD-10-CM | POA: Diagnosis not present

## 2012-06-10 DIAGNOSIS — I495 Sick sinus syndrome: Secondary | ICD-10-CM | POA: Diagnosis not present

## 2012-07-05 ENCOUNTER — Ambulatory Visit (INDEPENDENT_AMBULATORY_CARE_PROVIDER_SITE_OTHER): Payer: Medicare Other | Admitting: Internal Medicine

## 2012-07-05 ENCOUNTER — Encounter: Payer: Self-pay | Admitting: Internal Medicine

## 2012-07-05 VITALS — BP 130/70 | HR 64 | Ht 70.0 in | Wt 254.8 lb

## 2012-07-05 DIAGNOSIS — Z950 Presence of cardiac pacemaker: Secondary | ICD-10-CM | POA: Diagnosis not present

## 2012-07-05 DIAGNOSIS — I10 Essential (primary) hypertension: Secondary | ICD-10-CM

## 2012-07-05 DIAGNOSIS — I495 Sick sinus syndrome: Secondary | ICD-10-CM | POA: Diagnosis not present

## 2012-07-05 LAB — PACEMAKER DEVICE OBSERVATION
AL THRESHOLD: 0.75 V
BAMS-0001: 180 {beats}/min
BAMS-0003: 60 {beats}/min
DEVICE MODEL PM: 1387418
RV LEAD AMPLITUDE: 8 mv
RV LEAD THRESHOLD: 1 V
VENTRICULAR PACING PM: 1

## 2012-07-05 NOTE — Progress Notes (Signed)
HPI Clarence Dawson returns today for followup. He is a pleasant 66 yo man with a h/o symptomatic bradycardia, HTN, dyslipidemia and is s/p PPM. In the interim he has been stable. He has had problems with peripheral edema and take an occaisional PRN diuretic. He admits to dietary indiscretion. Allergies  Allergen Reactions  . Food Other (See Comments)    Tree nuts- breathing difficulty, seafood- breathing difficulty  . Iodine   . Voltaren (Diclofenac Sodium) Other (See Comments)    Feels like things are crawling on him     Current Outpatient Prescriptions  Medication Sig Dispense Refill  . acetaminophen (TYLENOL) 325 MG tablet Take 650 mg by mouth every 6 (six) hours as needed.        Marland Kitchen amLODipine (NORVASC) 10 MG tablet Take 10 mg by mouth daily.        Marland Kitchen aspirin 81 MG tablet Take 81 mg by mouth daily.        . cloNIDine (CATAPRES) 0.1 MG tablet Take 0.1 mg by mouth 2 (two) times daily.      Marland Kitchen ezetimibe-simvastatin (VYTORIN) 10-20 MG per tablet Take 1 tablet by mouth at bedtime.      . Fluticasone-Salmeterol (ADVAIR DISKUS) 250-50 MCG/DOSE AEPB Inhale 1 puff into the lungs every 12 (twelve) hours.        Marland Kitchen glucose blood test strip 1 each by Other route as needed. Use as instructed       . levothyroxine (SYNTHROID, LEVOTHROID) 175 MCG tablet Take 175 mcg by mouth daily.        Marland Kitchen losartan-hydrochlorothiazide (HYZAAR) 100-25 MG per tablet Take 1 tablet by mouth daily.      . meloxicam (MOBIC) 15 MG tablet Take 15 mg by mouth daily.      . metFORMIN (GLUCOPHAGE) 500 MG tablet Take 1,000 mg by mouth 2 (two) times daily with a meal.       . metoprolol (LOPRESSOR) 100 MG tablet Take 100 mg by mouth 2 (two) times daily.       . Multiple Vitamin (MULTIVITAMIN) tablet Take 1 tablet by mouth daily.        . nateglinide (STARLIX) 120 MG tablet Take 120 mg by mouth 2 (two) times daily.          Past Medical History  Diagnosis Date  . Hypertension   . Allergic rhinitis   . Atrial fibrillation   .  Syncope   . Sinoatrial node dysfunction   . Hyperthyroidism   . 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 Hyperlipidemia   . RBBB (right bundle branch block)     Coronary Artherosclerosis  . Tricuspid valve disorder   . Aortic valve disorder   . Carotid artery stenosis   . Diabetes mellitus   . Hemorrhage of rectum   . Pacemaker     Oct 2005 in [place].  . Asthma     since childhood- seasonal allergies induced  . OSA (obstructive sleep apnea)     done in [place] 2005- place no longer in business  . Arthritis     ostoearthritis  . Sinoatrial node dysfunction     ROS:   All systems reviewed and negative except as noted in the HPI.   Past Surgical History  Procedure Date  . Gallbladder surgery 12/01/2006  . Effusion of joint 01/20/2007    Left Elbow  . Rotator cuff surgery 2001    Right shoulder  . Carpal tunnel release 1994    right wrist  . Arthropathy  2005    Rebuilding of left thumb and joint   . Hemorroidectomy 2003  . Circumcision   . A-v cardiac pacemaker insertion     Sick sinus syndrome DDR pacer  . Cardiac electrophysiology study and ablation 09/2008    for pvcs, Dr. Vesta Mixer  . Eye surgery     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  . Left knee arthroscopy     April 21 2011- [facility] center  . Carpal tunnel release 05/04/2012    Procedure: CARPAL TUNNEL RELEASE;  Surgeon: Nicki Reaper, MD;  Location: [facility];  Service: Orthopedics;  Laterality: Left;     Family History  Problem Relation Age of Onset  . Other Father 12    Sudden Cardiac death  . Pancreatic cancer Mother      History   Social History  . Marital Status: Married    Spouse Name: N/A    Number of Children: N/A  . Years of Education: N/A   Occupational History  . Not on file.   Social History Main Topics  . Smoking status: Former Smoker -- 1.0 packs/day for 25 years    Quit date: 12/30/1983  . Smokeless tobacco: Not on file  . Alcohol Use: 0.6 oz/week    1 Cans of beer per  week     1 can of beer or glasse of wine daily  . Drug Use: No  . Sexually Active: Not on file   Other Topics Concern  . Not on file   Social History Narrative   Regular exercise: No     BP 130/70  Pulse 64  Ht 5\' 10"  (1.778 m)  Wt 254 lb 12.8 oz (115.577 kg)  BMI 36.56 kg/m2  Physical Exam:  Well appearing middle aged man, NAD HEENT: Unremarkable Neck:  No JVD, no thyromegally Lungs:  Clear with no wheezes. HEART:  Regular rate rhythm, no murmurs, no rubs, no clicks Abd:  soft, positive bowel sounds, no organomegally, no rebound, no guarding Ext:  2 plus pulses, no edema, no cyanosis, no clubbing Skin:  No rashes no nodules Neuro:  CN II through XII intact, motor grossly intact  DEVICE  Normal device function.  See PaceArt for details.   Assess/Plan:

## 2012-07-05 NOTE — Assessment & Plan Note (Signed)
He is doing well. He is approaching ERI. Will recheck in several months.

## 2012-07-05 NOTE — Patient Instructions (Addendum)
Your physician recommends that you continue on your current medications as directed. Please refer to the Current Medication list given to you today.  Your physician wants you to follow-up in: 1 year with Dr. Taylor.  You will receive a reminder letter in the mail two months in advance. If you don't receive a letter, please call our office to schedule the follow-up appointment.  

## 2012-07-05 NOTE — Assessment & Plan Note (Signed)
His blood pressure has been fairly well controlled. I have encouraged him to maintain a low sodium diet and take his meds as prescribed.

## 2012-07-07 ENCOUNTER — Encounter: Payer: Self-pay | Admitting: Internal Medicine

## 2012-07-12 DIAGNOSIS — Z79899 Other long term (current) drug therapy: Secondary | ICD-10-CM | POA: Diagnosis not present

## 2012-07-15 DIAGNOSIS — I495 Sick sinus syndrome: Secondary | ICD-10-CM | POA: Diagnosis not present

## 2012-07-20 DIAGNOSIS — I10 Essential (primary) hypertension: Secondary | ICD-10-CM | POA: Diagnosis not present

## 2012-07-20 DIAGNOSIS — E119 Type 2 diabetes mellitus without complications: Secondary | ICD-10-CM | POA: Diagnosis not present

## 2012-07-21 DIAGNOSIS — E119 Type 2 diabetes mellitus without complications: Secondary | ICD-10-CM | POA: Diagnosis not present

## 2012-08-12 DIAGNOSIS — I495 Sick sinus syndrome: Secondary | ICD-10-CM | POA: Diagnosis not present

## 2012-08-13 DIAGNOSIS — M722 Plantar fascial fibromatosis: Secondary | ICD-10-CM | POA: Diagnosis not present

## 2012-09-16 DIAGNOSIS — I495 Sick sinus syndrome: Secondary | ICD-10-CM | POA: Diagnosis not present

## 2012-09-30 DIAGNOSIS — H43819 Vitreous degeneration, unspecified eye: Secondary | ICD-10-CM | POA: Diagnosis not present

## 2012-10-14 DIAGNOSIS — I495 Sick sinus syndrome: Secondary | ICD-10-CM

## 2012-10-21 DIAGNOSIS — M1710 Unilateral primary osteoarthritis, unspecified knee: Secondary | ICD-10-CM | POA: Diagnosis not present

## 2012-11-01 DIAGNOSIS — Z947 Corneal transplant status: Secondary | ICD-10-CM | POA: Diagnosis not present

## 2012-11-01 DIAGNOSIS — H43819 Vitreous degeneration, unspecified eye: Secondary | ICD-10-CM | POA: Diagnosis not present

## 2012-11-09 ENCOUNTER — Encounter (HOSPITAL_COMMUNITY): Payer: Self-pay | Admitting: Pharmacy Technician

## 2012-11-10 ENCOUNTER — Other Ambulatory Visit: Payer: Self-pay | Admitting: Physician Assistant

## 2012-11-10 ENCOUNTER — Encounter: Payer: Self-pay | Admitting: Physician Assistant

## 2012-11-10 DIAGNOSIS — I359 Nonrheumatic aortic valve disorder, unspecified: Secondary | ICD-10-CM | POA: Insufficient documentation

## 2012-11-10 DIAGNOSIS — G4733 Obstructive sleep apnea (adult) (pediatric): Secondary | ICD-10-CM | POA: Insufficient documentation

## 2012-11-10 DIAGNOSIS — M1990 Unspecified osteoarthritis, unspecified site: Secondary | ICD-10-CM | POA: Insufficient documentation

## 2012-11-10 DIAGNOSIS — E0590 Thyrotoxicosis, unspecified without thyrotoxic crisis or storm: Secondary | ICD-10-CM | POA: Insufficient documentation

## 2012-11-10 DIAGNOSIS — M25569 Pain in unspecified knee: Secondary | ICD-10-CM | POA: Diagnosis not present

## 2012-11-10 DIAGNOSIS — I10 Essential (primary) hypertension: Secondary | ICD-10-CM

## 2012-11-10 DIAGNOSIS — M1710 Unilateral primary osteoarthritis, unspecified knee: Secondary | ICD-10-CM | POA: Diagnosis not present

## 2012-11-10 DIAGNOSIS — I079 Rheumatic tricuspid valve disease, unspecified: Secondary | ICD-10-CM | POA: Insufficient documentation

## 2012-11-10 DIAGNOSIS — I4819 Other persistent atrial fibrillation: Secondary | ICD-10-CM | POA: Insufficient documentation

## 2012-11-10 DIAGNOSIS — E119 Type 2 diabetes mellitus without complications: Secondary | ICD-10-CM | POA: Insufficient documentation

## 2012-11-10 DIAGNOSIS — I4891 Unspecified atrial fibrillation: Secondary | ICD-10-CM

## 2012-11-10 DIAGNOSIS — E782 Mixed hyperlipidemia: Secondary | ICD-10-CM | POA: Insufficient documentation

## 2012-11-10 DIAGNOSIS — R55 Syncope and collapse: Secondary | ICD-10-CM

## 2012-11-10 DIAGNOSIS — I4510 Unspecified right bundle-branch block: Secondary | ICD-10-CM

## 2012-11-10 DIAGNOSIS — Z23 Encounter for immunization: Secondary | ICD-10-CM | POA: Diagnosis not present

## 2012-11-10 DIAGNOSIS — Z950 Presence of cardiac pacemaker: Secondary | ICD-10-CM | POA: Insufficient documentation

## 2012-11-10 DIAGNOSIS — I6529 Occlusion and stenosis of unspecified carotid artery: Secondary | ICD-10-CM | POA: Insufficient documentation

## 2012-11-10 DIAGNOSIS — E785 Hyperlipidemia, unspecified: Secondary | ICD-10-CM

## 2012-11-10 NOTE — H&P (Signed)
Clarence Dawson is an 66 y.o. male.   Chief Complaint: left knee pain HPI: Clarence Dawson is a 66 year old seen for follow-up from Clarence Dawson significant persistent left knee pain. We performed a left knee arthroscopy on 04/21/11 and Clarence Dawson was found to have significant medial compartment degenerative changes and meniscal tearing. No degenerative changes laterally or in the patellofemoral joint. Clarence Dawson is having significant persistent pain in the knee. We saw Clarence Dawson on 04/27/12 and Clarence Dawson had a course of Supartz injections without relief. Clarence Dawson has significant medial pain with weightbearing and activity relieved by rest. The excruciating pain is getting progressively worse, limiting activities of daily living, poses a significant fall risk, and has failed multiple conservative treatments including intraarticular cortisone injections, intraarticular Supartz, bracing, medication and home physical therapy.  Past Medical History  Diagnosis Date  . Hypertension   . Allergic rhinitis   . Atrial fibrillation   . Syncope   . Sinoatrial node dysfunction   . Hyperthyroidism   . 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 Hyperlipidemia   . RBBB (right bundle branch block)     Coronary Artherosclerosis  . Tricuspid valve disorder   . Aortic valve disorder   . Carotid artery stenosis   . Diabetes   . Hemorrhage of rectum   . Pacemaker     Oct 2005 in [place].  . Asthma     since childhood- seasonal allergies induced  . OSA (obstructive sleep apnea)     done in [place] 2005- place no longer in business  . Arthritis     ostoearthritis  . Sinoatrial node dysfunction     Past Surgical History  Procedure Date  . Gallbladder surgery 12/01/2006  . Effusion of joint 01/20/2007    Left Elbow  . Rotator cuff surgery 2001    Right shoulder  . Carpal tunnel release 1994    right wrist  . Arthropathy 2005    Rebuilding of left thumb and joint   . Hemorroidectomy 2003  . Circumcision   . A-v cardiac pacemaker insertion     Sick sinus syndrome DDR pacer  .  Cardiac electrophysiology study and ablation 09/2008    for pvcs, Dr. Dandamudi  . 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 Left knee arthroscopy     April 21 2011- [facility] center  . Carpal tunnel release 05/04/2012    Procedure: CARPAL TUNNEL RELEASE;  Surgeon: Gary R Kuzma, MD;  Location: [facility];  Service: Orthopedics;  Laterality: Left;    Family History  Problem Relation Age of Onset  . Other Father 60    Sudden Cardiac death  . Pancreatic cancer Mother    Social History:  reports that Clarence Dawson quit smoking about 28 years ago. Clarence Dawson does not have any smokeless tobacco history on file. Clarence Dawson reports that Clarence Dawson drinks about .6 ounces of alcohol per week. Clarence Dawson reports that Clarence Dawson does not use illicit drugs.  Allergies:  Allergies  Allergen Reactions  . Food Anaphylaxis and Shortness Of Breath    Tree nuts- breathing difficulty, seafood- breathing difficulty  . Iodine Other (See Comments)    Reaction unknown  . Voltaren (Diclofenac Sodium) Other (See Comments)    Feels like things are crawling on Clarence Dawson   Current Outpatient Prescriptions on File Prior to Visit  Medication Sig Dispense Refill  . acetaminophen (TYLENOL) 325 MG tablet Take 650 mg by mouth every 6 (six) hours as needed. For pain/fever      .   amLODipine (NORVASC) 10 MG tablet Take 10 mg by mouth daily.        . aspirin 81 MG tablet Take 81 mg by mouth daily.        . cloNIDine (CATAPRES) 0.1 MG tablet Take 0.1 mg by mouth 2 (two) times daily.      . ezetimibe-simvastatin (VYTORIN) 10-20 MG per tablet Take 1 tablet by mouth at bedtime.      . Fluticasone-Salmeterol (ADVAIR DISKUS) 250-50 MCG/DOSE AEPB Inhale 1 puff into the lungs every 12 (twelve) hours.        . levothyroxine (SYNTHROID, LEVOTHROID) 175 MCG tablet Take 175 mcg by mouth daily.        . losartan-hydrochlorothiazide (HYZAAR) 100-25 MG per tablet Take 1 tablet by mouth daily.      . meloxicam (MOBIC) 15 MG tablet Take 15 mg by mouth  daily.      . metoprolol (LOPRESSOR) 100 MG tablet Take 100 mg by mouth 2 (two) times daily.       . Multiple Vitamin (MULTIVITAMIN) tablet Take 1 tablet by mouth daily.        . nateglinide (STARLIX) 120 MG tablet Take 120 mg by mouth 2 (two) times daily.       . metFORMIN (GLUCOPHAGE) 500 MG tablet Take 1,000 mg by mouth 2 (two) times daily with a meal.         (Not in a hospital admission)  No results found for this or any previous visit (from the past 48 hour(s)). No results found.  Review of Systems  Constitutional: Negative.   HENT: Negative.   Eyes: Negative.   Respiratory: Negative.   Cardiovascular: Negative.   Gastrointestinal: Negative.   Genitourinary: Negative.   Musculoskeletal:       Left pain  Skin: Negative.   Neurological: Negative.   Endo/Heme/Allergies: Negative.   Psychiatric/Behavioral: Negative.     Blood pressure 178/94, pulse 61, temperature 98.4 F (36.9 C), height 5' 10" (1.778 m), weight 112.038 kg (247 lb), SpO2 92.00%. Physical Exam  Constitutional: Clarence Dawson is oriented to person, place, and time. Clarence Dawson appears well-developed and well-nourished.  HENT:  Head: Normocephalic and atraumatic.  Eyes: Conjunctivae normal and EOM are normal. Pupils are equal, round, and reactive to light.  Neck: Neck supple.  Cardiovascular: Normal rate, regular rhythm and intact distal pulses.   Murmur heard. Respiratory: Effort normal and breath sounds normal.  GI: Bowel sounds are normal.  Genitourinary:       Not pertinent to current symptomatology therefore not examined.  Musculoskeletal:       Examination of Clarence Dawson left knee reveals pain medially, 1+ crepitation, 1+ synovitis range of motion 0-125 degrees knee is stable with normal patella tracking. Vascular exam: pulses 2+ and symmetric.  Neurological: Clarence Dawson is alert and oriented to person, place, and time.  Skin: Skin is warm and dry.  Psychiatric: Clarence Dawson has a normal mood and affect. Clarence Dawson behavior is normal. Judgment and  thought content normal.     Assessment Patient Active Problem List  Diagnosis  . ESSENTIAL HYPERTENSION, BENIGN  . PREMATURE VENTRICULAR CONTRACTIONS  . CARDIAC PACEMAKER IN SITU  . PVC's (premature ventricular contractions)  . Sinoatrial node dysfunction  . Hypertension  . Atrial fibrillation  . Syncope  . Hyperthyroidism  . Hyperlipidemia  . RBBB (right bundle branch block)  . Tricuspid valve disorder  . Aortic valve disorder  . Carotid artery stenosis  . Diabetes  . Pacemaker  . OSA (obstructive sleep apnea)  .   Arthritis    Plan I talk to Clarence Dawson and Clarence Dawson about this in detail. I do think Clarence Dawson is a candidate for a unicompartmental arthroplasty. Discussed risks benefits and possible complications of the surgery in detail and Clarence Dawson understands this completely. We told them if during surgery Clarence Dawson has significant degenerative changes in the rest of the knee we will convert to a total knee replacement and they understand and agree. Clarence Dawson'll has been cleared preoperatively by Dr. Greg Taylor and Dr. Roy Fagan.   Oza Oberle J 11/10/2012, 3:36 PM    

## 2012-11-15 ENCOUNTER — Encounter (HOSPITAL_COMMUNITY): Payer: Self-pay

## 2012-11-15 ENCOUNTER — Encounter (HOSPITAL_COMMUNITY)
Admission: RE | Admit: 2012-11-15 | Discharge: 2012-11-15 | Disposition: A | Discharge status: Home / Self Care | Payer: Medicare Other | Source: Ambulatory Visit | Attending: Orthopedic Surgery | Admitting: Orthopedic Surgery

## 2012-11-15 ENCOUNTER — Encounter (HOSPITAL_COMMUNITY)
Admission: RE | Admit: 2012-11-15 | Discharge: 2012-11-15 | Disposition: A | Discharge status: Home / Self Care | Payer: Medicare Other | Source: Ambulatory Visit | Attending: Physician Assistant | Admitting: Physician Assistant

## 2012-11-15 DIAGNOSIS — I10 Essential (primary) hypertension: Secondary | ICD-10-CM | POA: Diagnosis not present

## 2012-11-15 HISTORY — DX: Hypothyroidism, unspecified: E03.9

## 2012-11-15 HISTORY — DX: Gastro-esophageal reflux disease without esophagitis: K21.9

## 2012-11-15 HISTORY — DX: Personal history of other diseases of the digestive system: Z87.19

## 2012-11-15 LAB — CBC WITH DIFFERENTIAL/PLATELET
Basophils Relative: 1 % (ref 0–1)
Eosinophils Absolute: 0.2 10*3/uL (ref 0.0–0.7)
Lymphs Abs: 1.4 10*3/uL (ref 0.7–4.0)
MCH: 31.5 pg (ref 26.0–34.0)
Neutrophils Relative %: 61 % (ref 43–77)
Platelets: 151 10*3/uL (ref 150–400)
RBC: 4.99 MIL/uL (ref 4.22–5.81)

## 2012-11-15 LAB — COMPREHENSIVE METABOLIC PANEL
ALT: 25 U/L (ref 0–53)
Albumin: 3.6 g/dL (ref 3.5–5.2)
Alkaline Phosphatase: 53 U/L (ref 39–117)
Glucose, Bld: 163 mg/dL — ABNORMAL HIGH (ref 70–99)
Potassium: 4.6 mEq/L (ref 3.5–5.1)
Sodium: 142 mEq/L (ref 135–145)
Total Protein: 6.9 g/dL (ref 6.0–8.3)

## 2012-11-15 LAB — URINALYSIS, ROUTINE W REFLEX MICROSCOPIC
Protein, ur: 100 mg/dL — AB
Urobilinogen, UA: 1 mg/dL (ref 0.0–1.0)

## 2012-11-15 LAB — URINE MICROSCOPIC-ADD ON

## 2012-11-15 LAB — APTT: aPTT: 28 seconds (ref 24–37)

## 2012-11-15 LAB — TYPE AND SCREEN: Antibody Screen: NEGATIVE

## 2012-11-15 LAB — ABO/RH: ABO/RH(D): A POS

## 2012-11-15 NOTE — Pre-Procedure Instructions (Signed)
20 Clarence Dawson  11/15/2012   Your procedure is scheduled on:  MONDAY, NOVEMBER 25TH.  Report to [facility] at 5:30AM.  Call this number if you have problems the morning of surgery: [phone]   Remember: Nothing to eat or drink after Midnight.     Take these medicines the morning of surgery with A SIP OF WATER: Amlodipine (Norvasc), Levothyroxine (Synthyroid), Metoprolol (Lopressor).  Stop taking Meloxicam (MObic).  Do not take any Aspirin or Aspirin Products, BC or Goody  Powders, NSAIDs (Ibuprofen, Naproxen) or herbal medications.   Do not wear jewelry, make-up or nail polish.  Do not wear lotions, powders, or perfumes. You may wear deodorant.  Do not shave 48 hours prior to surgery. Men may shave face and neck.  Do not bring valuables to the hospital.  Contacts, dentures or bridgework may not be worn into surgery.  Leave suitcase in the car. After surgery it may be brought to your room.  For patients admitted to the hospital, checkout time is 11:00 AM the day of discharge.   Patients discharged the day of surgery will not be allowed to drive home.  Name and phone number of your driver: NA      Special Instructions: Shower using CHG 2 nights before surgery and the night before surgery.  If you shower the day of surgery use CHG.  Use special wash - you have one bottle of CHG for all showers.  You should use approximately 1/3 of the bottle for each shower.   Please read over the following fact sheets that you were given: Pain Booklet, Coughing and Deep Breathing, Blood Transfusion Information and Surgical Site Infection Prevention

## 2012-11-16 LAB — URINE CULTURE: Culture: NO GROWTH

## 2012-11-17 DIAGNOSIS — Z947 Corneal transplant status: Secondary | ICD-10-CM | POA: Diagnosis not present

## 2012-11-17 DIAGNOSIS — Z961 Presence of intraocular lens: Secondary | ICD-10-CM | POA: Diagnosis not present

## 2012-11-17 DIAGNOSIS — H43819 Vitreous degeneration, unspecified eye: Secondary | ICD-10-CM | POA: Diagnosis not present

## 2012-11-17 DIAGNOSIS — Z9889 Other specified postprocedural states: Secondary | ICD-10-CM | POA: Diagnosis not present

## 2012-11-18 DIAGNOSIS — I495 Sick sinus syndrome: Secondary | ICD-10-CM | POA: Diagnosis not present

## 2012-11-18 NOTE — Progress Notes (Signed)
PPM RX ON CHART .Marland KitchenMarland Kitchen  CALLED DR [facility] OFFICE  [phone].Marland KitchenTWO OFFICE PERSONNEL LOOKED THRU ALL MATERIAL RECEIVED FROM PT'S FORMER HOME ([facility]. )  NO SLEEP STUDY FOUND.

## 2012-11-21 MED ORDER — DEXTROSE 5 % IV SOLN
3.0000 g | INTRAVENOUS | Status: AC
  Administered 2012-11-22: 3 g via INTRAVENOUS
  Filled 2012-11-21: qty 3000

## 2012-11-22 ENCOUNTER — Ambulatory Visit (HOSPITAL_COMMUNITY): Payer: Medicare Other | Admitting: Anesthesiology

## 2012-11-22 ENCOUNTER — Encounter (HOSPITAL_COMMUNITY)
Admission: RE | Disposition: A | Discharge status: Home Health | Payer: Self-pay | Source: Ambulatory Visit | Attending: Orthopedic Surgery

## 2012-11-22 ENCOUNTER — Encounter (HOSPITAL_COMMUNITY): Payer: Self-pay | Admitting: Anesthesiology

## 2012-11-22 ENCOUNTER — Encounter (HOSPITAL_COMMUNITY): Payer: Self-pay | Admitting: *Deleted

## 2012-11-22 ENCOUNTER — Inpatient Hospital Stay (HOSPITAL_COMMUNITY)
Admission: RE | Admit: 2012-11-22 | Discharge: 2012-11-23 | DRG: 470 | Disposition: A | Discharge status: Home Health | Payer: Medicare Other | Source: Ambulatory Visit | Attending: Orthopedic Surgery | Admitting: Orthopedic Surgery

## 2012-11-22 DIAGNOSIS — Z7982 Long term (current) use of aspirin: Secondary | ICD-10-CM | POA: Diagnosis not present

## 2012-11-22 DIAGNOSIS — I493 Ventricular premature depolarization: Secondary | ICD-10-CM | POA: Diagnosis present

## 2012-11-22 DIAGNOSIS — M1990 Unspecified osteoarthritis, unspecified site: Secondary | ICD-10-CM | POA: Diagnosis present

## 2012-11-22 DIAGNOSIS — I4510 Unspecified right bundle-branch block: Secondary | ICD-10-CM | POA: Diagnosis present

## 2012-11-22 DIAGNOSIS — M1712 Unilateral primary osteoarthritis, left knee: Secondary | ICD-10-CM | POA: Diagnosis present

## 2012-11-22 DIAGNOSIS — E119 Type 2 diabetes mellitus without complications: Secondary | ICD-10-CM | POA: Diagnosis not present

## 2012-11-22 DIAGNOSIS — M1710 Unilateral primary osteoarthritis, unspecified knee: Secondary | ICD-10-CM | POA: Diagnosis not present

## 2012-11-22 DIAGNOSIS — E0590 Thyrotoxicosis, unspecified without thyrotoxic crisis or storm: Secondary | ICD-10-CM | POA: Diagnosis present

## 2012-11-22 DIAGNOSIS — G4733 Obstructive sleep apnea (adult) (pediatric): Secondary | ICD-10-CM | POA: Diagnosis not present

## 2012-11-22 DIAGNOSIS — Z950 Presence of cardiac pacemaker: Secondary | ICD-10-CM | POA: Diagnosis present

## 2012-11-22 DIAGNOSIS — I495 Sick sinus syndrome: Secondary | ICD-10-CM | POA: Diagnosis present

## 2012-11-22 DIAGNOSIS — Z87891 Personal history of nicotine dependence: Secondary | ICD-10-CM | POA: Diagnosis not present

## 2012-11-22 DIAGNOSIS — IMO0002 Reserved for concepts with insufficient information to code with codable children: Secondary | ICD-10-CM | POA: Diagnosis not present

## 2012-11-22 DIAGNOSIS — E782 Mixed hyperlipidemia: Secondary | ICD-10-CM | POA: Diagnosis present

## 2012-11-22 DIAGNOSIS — E785 Hyperlipidemia, unspecified: Secondary | ICD-10-CM | POA: Diagnosis present

## 2012-11-22 DIAGNOSIS — G8918 Other acute postprocedural pain: Secondary | ICD-10-CM | POA: Diagnosis not present

## 2012-11-22 DIAGNOSIS — Z79899 Other long term (current) drug therapy: Secondary | ICD-10-CM

## 2012-11-22 DIAGNOSIS — I4891 Unspecified atrial fibrillation: Secondary | ICD-10-CM | POA: Diagnosis not present

## 2012-11-22 DIAGNOSIS — I359 Nonrheumatic aortic valve disorder, unspecified: Secondary | ICD-10-CM | POA: Diagnosis present

## 2012-11-22 DIAGNOSIS — I10 Essential (primary) hypertension: Secondary | ICD-10-CM | POA: Diagnosis present

## 2012-11-22 DIAGNOSIS — I079 Rheumatic tricuspid valve disease, unspecified: Secondary | ICD-10-CM | POA: Diagnosis present

## 2012-11-22 HISTORY — PX: PARTIAL KNEE ARTHROPLASTY: SHX2174

## 2012-11-22 LAB — GLUCOSE, CAPILLARY
Glucose-Capillary: 176 mg/dL — ABNORMAL HIGH (ref 70–99)
Glucose-Capillary: 212 mg/dL — ABNORMAL HIGH (ref 70–99)

## 2012-11-22 SURGERY — ARTHROPLASTY, KNEE, UNICOMPARTMENTAL
Anesthesia: General | Site: Knee | Laterality: Left | Wound class: Clean

## 2012-11-22 MED ORDER — ONDANSETRON HCL 4 MG/2ML IJ SOLN
INTRAMUSCULAR | Status: DC | PRN
  Administered 2012-11-22: 4 mg via INTRAVENOUS

## 2012-11-22 MED ORDER — LEVOTHYROXINE SODIUM 175 MCG PO TABS
175.0000 ug | ORAL_TABLET | Freq: Every day | ORAL | Status: DC
  Administered 2012-11-23: 175 ug via ORAL
  Filled 2012-11-22 (×2): qty 1

## 2012-11-22 MED ORDER — CHLORHEXIDINE GLUCONATE 4 % EX LIQD: 60.0000 mL | Freq: Once | CUTANEOUS | Status: DC

## 2012-11-22 MED ORDER — ONDANSETRON HCL 4 MG PO TABS: 4.0000 mg | ORAL_TABLET | Freq: Four times a day (QID) | ORAL | Status: DC | PRN

## 2012-11-22 MED ORDER — NEOSTIGMINE METHYLSULFATE 1 MG/ML IJ SOLN
INTRAMUSCULAR | Status: DC | PRN
  Administered 2012-11-22: 3 mg via INTRAVENOUS

## 2012-11-22 MED ORDER — AMLODIPINE BESYLATE 10 MG PO TABS
10.0000 mg | ORAL_TABLET | Freq: Every day | ORAL | Status: DC
  Administered 2012-11-23: 10 mg via ORAL
  Filled 2012-11-22: qty 1

## 2012-11-22 MED ORDER — BISACODYL 5 MG PO TBEC
10.0000 mg | DELAYED_RELEASE_TABLET | Freq: Every day | ORAL | Status: DC
  Administered 2012-11-22: 10 mg via ORAL
  Filled 2012-11-22: qty 2

## 2012-11-22 MED ORDER — PROPOFOL 10 MG/ML IV BOLUS
INTRAVENOUS | Status: DC | PRN
  Administered 2012-11-22: 200 mg via INTRAVENOUS

## 2012-11-22 MED ORDER — MIDAZOLAM HCL 5 MG/5ML IJ SOLN
INTRAMUSCULAR | Status: DC | PRN
  Administered 2012-11-22: 0.5 mg via INTRAVENOUS

## 2012-11-22 MED ORDER — CELECOXIB 200 MG PO CAPS
200.0000 mg | ORAL_CAPSULE | Freq: Two times a day (BID) | ORAL | Status: DC
  Administered 2012-11-22 – 2012-11-23 (×2): 200 mg via ORAL
  Filled 2012-11-22 (×3): qty 1

## 2012-11-22 MED ORDER — MOMETASONE FURO-FORMOTEROL FUM 100-5 MCG/ACT IN AERO
2.0000 | INHALATION_SPRAY | Freq: Two times a day (BID) | RESPIRATORY_TRACT | Status: DC
  Administered 2012-11-22 – 2012-11-23 (×2): 2 via RESPIRATORY_TRACT
  Filled 2012-11-22: qty 8.8

## 2012-11-22 MED ORDER — LACTATED RINGERS IV SOLN
INTRAVENOUS | Status: DC | PRN
  Administered 2012-11-22 (×2): via INTRAVENOUS

## 2012-11-22 MED ORDER — ROCURONIUM BROMIDE 100 MG/10ML IV SOLN
INTRAVENOUS | Status: DC | PRN
  Administered 2012-11-22: 5 mg via INTRAVENOUS
  Administered 2012-11-22: 40 mg via INTRAVENOUS
  Administered 2012-11-22: 10 mg via INTRAVENOUS

## 2012-11-22 MED ORDER — DOCUSATE SODIUM 100 MG PO CAPS
100.0000 mg | ORAL_CAPSULE | Freq: Two times a day (BID) | ORAL | Status: DC
  Administered 2012-11-22 – 2012-11-23 (×3): 100 mg via ORAL
  Filled 2012-11-22 (×3): qty 1

## 2012-11-22 MED ORDER — GLYCOPYRROLATE 0.2 MG/ML IJ SOLN
INTRAMUSCULAR | Status: DC | PRN
  Administered 2012-11-22: 0.4 mg via INTRAVENOUS

## 2012-11-22 MED ORDER — CEFUROXIME SODIUM 1.5 G IJ SOLR
INTRAMUSCULAR | Status: DC | PRN
  Administered 2012-11-22: 1.5 g

## 2012-11-22 MED ORDER — MENTHOL 3 MG MT LOZG: 1.0000 | LOZENGE | OROMUCOSAL | Status: DC | PRN

## 2012-11-22 MED ORDER — HYDROCHLOROTHIAZIDE 25 MG PO TABS
25.0000 mg | ORAL_TABLET | Freq: Every day | ORAL | Status: DC
  Administered 2012-11-23: 25 mg via ORAL
  Filled 2012-11-22 (×2): qty 1

## 2012-11-22 MED ORDER — METOCLOPRAMIDE HCL 5 MG/ML IJ SOLN: 5.0000 mg | Freq: Three times a day (TID) | INTRAMUSCULAR | Status: DC | PRN

## 2012-11-22 MED ORDER — LIDOCAINE HCL (CARDIAC) 20 MG/ML IV SOLN
INTRAVENOUS | Status: DC | PRN
  Administered 2012-11-22: 70 mg via INTRAVENOUS

## 2012-11-22 MED ORDER — ARTIFICIAL TEARS OP OINT
TOPICAL_OINTMENT | OPHTHALMIC | Status: DC | PRN
  Administered 2012-11-22: 1 via OPHTHALMIC

## 2012-11-22 MED ORDER — FAMOTIDINE 20 MG PO TABS
20.0000 mg | ORAL_TABLET | Freq: Two times a day (BID) | ORAL | Status: DC
  Administered 2012-11-22 – 2012-11-23 (×2): 20 mg via ORAL
  Filled 2012-11-22 (×3): qty 1

## 2012-11-22 MED ORDER — METOPROLOL TARTRATE 100 MG PO TABS
100.0000 mg | ORAL_TABLET | Freq: Two times a day (BID) | ORAL | Status: DC
  Administered 2012-11-22 – 2012-11-23 (×2): 100 mg via ORAL
  Filled 2012-11-22 (×3): qty 1

## 2012-11-22 MED ORDER — PHENYLEPHRINE HCL 10 MG/ML IJ SOLN
INTRAMUSCULAR | Status: DC | PRN
  Administered 2012-11-22: 40 ug via INTRAVENOUS

## 2012-11-22 MED ORDER — ONE-DAILY MULTI VITAMINS PO TABS: 1.0000 | ORAL_TABLET | Freq: Every day | ORAL | Status: DC

## 2012-11-22 MED ORDER — OXYCODONE HCL 5 MG PO TABS
5.0000 mg | ORAL_TABLET | ORAL | Status: DC | PRN
  Administered 2012-11-22 – 2012-11-23 (×3): 10 mg via ORAL
  Filled 2012-11-22 (×3): qty 2

## 2012-11-22 MED ORDER — ONDANSETRON HCL 4 MG/2ML IJ SOLN: 4.0000 mg | Freq: Four times a day (QID) | INTRAMUSCULAR | Status: DC | PRN

## 2012-11-22 MED ORDER — FENTANYL CITRATE 0.05 MG/ML IJ SOLN
INTRAMUSCULAR | Status: DC | PRN
  Administered 2012-11-22: 25 ug via INTRAVENOUS
  Administered 2012-11-22: 50 ug via INTRAVENOUS
  Administered 2012-11-22: 25 ug via INTRAVENOUS
  Administered 2012-11-22 (×3): 50 ug via INTRAVENOUS

## 2012-11-22 MED ORDER — CEFAZOLIN SODIUM-DEXTROSE 2-3 GM-% IV SOLR
2.0000 g | Freq: Four times a day (QID) | INTRAVENOUS | Status: AC
  Administered 2012-11-22 (×2): 2 g via INTRAVENOUS
  Filled 2012-11-22 (×2): qty 50

## 2012-11-22 MED ORDER — ENOXAPARIN SODIUM 30 MG/0.3ML ~~LOC~~ SOLN
30.0000 mg | Freq: Two times a day (BID) | SUBCUTANEOUS | Status: DC
Start: 2012-11-23 — End: 2012-11-23
  Administered 2012-11-23: 30 mg via SUBCUTANEOUS
  Filled 2012-11-22 (×3): qty 0.3

## 2012-11-22 MED ORDER — ALUM & MAG HYDROXIDE-SIMETH 200-200-20 MG/5ML PO SUSP: 30.0000 mL | ORAL | Status: DC | PRN

## 2012-11-22 MED ORDER — DIPHENHYDRAMINE HCL 12.5 MG/5ML PO ELIX: 12.5000 mg | ORAL_SOLUTION | ORAL | Status: DC | PRN

## 2012-11-22 MED ORDER — METOCLOPRAMIDE HCL 10 MG PO TABS: 5.0000 mg | ORAL_TABLET | Freq: Three times a day (TID) | ORAL | Status: DC | PRN

## 2012-11-22 MED ORDER — ONDANSETRON HCL 4 MG/2ML IJ SOLN: 4.0000 mg | Freq: Once | INTRAMUSCULAR | Status: DC | PRN

## 2012-11-22 MED ORDER — CEFUROXIME SODIUM 1.5 G IJ SOLR
INTRAMUSCULAR | Status: AC
  Filled 2012-11-22: qty 1.5

## 2012-11-22 MED ORDER — DEXAMETHASONE 6 MG PO TABS
10.0000 mg | ORAL_TABLET | Freq: Every day | ORAL | Status: DC
  Administered 2012-11-22 – 2012-11-23 (×2): 10 mg via ORAL
  Filled 2012-11-22 (×2): qty 1

## 2012-11-22 MED ORDER — CLONIDINE HCL 0.1 MG PO TABS
0.1000 mg | ORAL_TABLET | Freq: Two times a day (BID) | ORAL | Status: DC
  Administered 2012-11-22 – 2012-11-23 (×2): 0.1 mg via ORAL
  Filled 2012-11-22 (×4): qty 1

## 2012-11-22 MED ORDER — ACETAMINOPHEN 650 MG RE SUPP: 650.0000 mg | Freq: Four times a day (QID) | RECTAL | Status: DC | PRN

## 2012-11-22 MED ORDER — POTASSIUM CHLORIDE IN NACL 20-0.9 MEQ/L-% IV SOLN
INTRAVENOUS | Status: DC
  Administered 2012-11-22: 100 mL/h via INTRAVENOUS
  Administered 2012-11-22: 13:00:00 via INTRAVENOUS
  Filled 2012-11-22 (×4): qty 1000

## 2012-11-22 MED ORDER — LOSARTAN POTASSIUM-HCTZ 100-25 MG PO TABS: 1.0000 | ORAL_TABLET | Freq: Every day | ORAL | Status: DC

## 2012-11-22 MED ORDER — BUPIVACAINE-EPINEPHRINE PF 0.25-1:200000 % IJ SOLN
INTRAMUSCULAR | Status: DC | PRN
  Administered 2012-11-22: 30 mL

## 2012-11-22 MED ORDER — DEXAMETHASONE SODIUM PHOSPHATE 10 MG/ML IJ SOLN
INTRAMUSCULAR | Status: DC | PRN
  Administered 2012-11-22: 10 mg via INTRAVENOUS

## 2012-11-22 MED ORDER — ACETAMINOPHEN 325 MG PO TABS: 650.0000 mg | ORAL_TABLET | Freq: Four times a day (QID) | ORAL | Status: DC | PRN

## 2012-11-22 MED ORDER — LACTATED RINGERS IV SOLN: INTRAVENOUS | Status: DC

## 2012-11-22 MED ORDER — HYDROMORPHONE HCL PF 1 MG/ML IJ SOLN
0.2500 mg | INTRAMUSCULAR | Status: DC | PRN
  Administered 2012-11-22 (×4): 0.5 mg via INTRAVENOUS

## 2012-11-22 MED ORDER — EZETIMIBE-SIMVASTATIN 10-20 MG PO TABS
1.0000 | ORAL_TABLET | Freq: Every day | ORAL | Status: DC
  Administered 2012-11-22: 1 via ORAL
  Filled 2012-11-22 (×2): qty 1

## 2012-11-22 MED ORDER — DEXTROSE 5 % IV SOLN
INTRAVENOUS | Status: DC | PRN
  Administered 2012-11-22: 07:00:00 via INTRAVENOUS

## 2012-11-22 MED ORDER — BUPIVACAINE-EPINEPHRINE PF 0.5-1:200000 % IJ SOLN
INTRAMUSCULAR | Status: DC | PRN
  Administered 2012-11-22: 25 mL

## 2012-11-22 MED ORDER — INSULIN ASPART 100 UNIT/ML ~~LOC~~ SOLN
0.0000 [IU] | Freq: Every day | SUBCUTANEOUS | Status: DC
  Administered 2012-11-22: 5 [IU] via SUBCUTANEOUS

## 2012-11-22 MED ORDER — LOSARTAN POTASSIUM 50 MG PO TABS
100.0000 mg | ORAL_TABLET | Freq: Every day | ORAL | Status: DC
  Administered 2012-11-23: 100 mg via ORAL
  Filled 2012-11-22 (×2): qty 2

## 2012-11-22 MED ORDER — DEXAMETHASONE SODIUM PHOSPHATE 10 MG/ML IJ SOLN
10.0000 mg | Freq: Every day | INTRAMUSCULAR | Status: DC
  Filled 2012-11-22 (×2): qty 1

## 2012-11-22 MED ORDER — ACETAMINOPHEN 10 MG/ML IV SOLN
1000.0000 mg | Freq: Four times a day (QID) | INTRAVENOUS | Status: AC
  Administered 2012-11-22 – 2012-11-23 (×4): 1000 mg via INTRAVENOUS
  Filled 2012-11-22 (×4): qty 100

## 2012-11-22 MED ORDER — 0.9 % SODIUM CHLORIDE (POUR BTL) OPTIME
TOPICAL | Status: DC | PRN
  Administered 2012-11-22: 1000 mL

## 2012-11-22 MED ORDER — INSULIN ASPART 100 UNIT/ML ~~LOC~~ SOLN
0.0000 [IU] | Freq: Three times a day (TID) | SUBCUTANEOUS | Status: DC
  Administered 2012-11-22 – 2012-11-23 (×3): 3 [IU] via SUBCUTANEOUS

## 2012-11-22 MED ORDER — PHENOL 1.4 % MT LIQD: 1.0000 | OROMUCOSAL | Status: DC | PRN

## 2012-11-22 MED ORDER — BUPIVACAINE-EPINEPHRINE PF 0.25-1:200000 % IJ SOLN
INTRAMUSCULAR | Status: AC
  Filled 2012-11-22: qty 30

## 2012-11-22 MED ORDER — HYDROMORPHONE HCL PF 1 MG/ML IJ SOLN: 0.5000 mg | INTRAMUSCULAR | Status: DC | PRN

## 2012-11-22 MED ORDER — ADULT MULTIVITAMIN W/MINERALS CH
1.0000 | ORAL_TABLET | Freq: Every day | ORAL | Status: DC
  Administered 2012-11-22 – 2012-11-23 (×2): 1 via ORAL
  Filled 2012-11-22 (×2): qty 1

## 2012-11-22 MED ORDER — HYDROMORPHONE HCL PF 1 MG/ML IJ SOLN
INTRAMUSCULAR | Status: AC
  Filled 2012-11-22: qty 2

## 2012-11-22 SURGICAL SUPPLY — 70 items
AUTOTRANSFUSION W/QD PVC DRAIN (AUTOTRANSFUSION) IMPLANT
BANDAGE ELASTIC 4 VELCRO ST LF (GAUZE/BANDAGES/DRESSINGS) ×2 IMPLANT
BANDAGE ELASTIC 6 VELCRO ST LF (GAUZE/BANDAGES/DRESSINGS) ×2 IMPLANT
BANDAGE ESMARK 6X9 LF (GAUZE/BANDAGES/DRESSINGS) ×1 IMPLANT
BNDG ELASTIC 6X10 VLCR STRL LF (GAUZE/BANDAGES/DRESSINGS) ×2 IMPLANT
BNDG ESMARK 6X9 LF (GAUZE/BANDAGES/DRESSINGS) ×2
BOWL SMART MIX CTS (DISPOSABLE) ×2 IMPLANT
CEMENT HV SMART SET (Cement) ×4 IMPLANT
CLOTH BEACON ORANGE TIMEOUT ST (SAFETY) ×2 IMPLANT
COVER BACK TABLE 24X17X13 BIG (DRAPES) IMPLANT
COVER SURGICAL LIGHT HANDLE (MISCELLANEOUS) ×2 IMPLANT
CUFF TOURNIQUET SINGLE 34IN LL (TOURNIQUET CUFF) ×2 IMPLANT
CUFF TOURNIQUET SINGLE 44IN (TOURNIQUET CUFF) IMPLANT
DRAPE EXTREMITY T 121X128X90 (DRAPE) ×2 IMPLANT
DRAPE INCISE IOBAN 66X45 STRL (DRAPES) IMPLANT
DRAPE OEC MINIVIEW 54X84 (DRAPES) ×2 IMPLANT
DRAPE PROXIMA HALF (DRAPES) ×2 IMPLANT
DRAPE U-SHAPE 47X51 STRL (DRAPES) ×2 IMPLANT
DRSG ADAPTIC 3X8 NADH LF (GAUZE/BANDAGES/DRESSINGS) ×2 IMPLANT
DRSG PAD ABDOMINAL 8X10 ST (GAUZE/BANDAGES/DRESSINGS) ×4 IMPLANT
DURAPREP 26ML APPLICATOR (WOUND CARE) ×2 IMPLANT
ELECT CAUTERY BLADE 6.4 (BLADE) ×2 IMPLANT
ELECT REM PT RETURN 9FT ADLT (ELECTROSURGICAL) ×2
ELECTRODE REM PT RTRN 9FT ADLT (ELECTROSURGICAL) ×1 IMPLANT
EVACUATOR 1/8 PVC DRAIN (DRAIN) ×2 IMPLANT
FACESHIELD LNG OPTICON STERILE (SAFETY) ×2 IMPLANT
GLOVE BIO SURGEON STRL SZ7 (GLOVE) ×2 IMPLANT
GLOVE BIOGEL PI IND STRL 6.5 (GLOVE) ×1 IMPLANT
GLOVE BIOGEL PI IND STRL 7.0 (GLOVE) ×1 IMPLANT
GLOVE BIOGEL PI IND STRL 7.5 (GLOVE) ×1 IMPLANT
GLOVE BIOGEL PI IND STRL 8 (GLOVE) ×1 IMPLANT
GLOVE BIOGEL PI INDICATOR 6.5 (GLOVE) ×1
GLOVE BIOGEL PI INDICATOR 7.0 (GLOVE) ×1
GLOVE BIOGEL PI INDICATOR 7.5 (GLOVE) ×1
GLOVE BIOGEL PI INDICATOR 8 (GLOVE) ×1
GLOVE ORTHO TXT STRL SZ7.5 (GLOVE) ×2 IMPLANT
GLOVE SS BIOGEL STRL SZ 7.5 (GLOVE) ×1 IMPLANT
GLOVE SUPERSENSE BIOGEL SZ 7.5 (GLOVE) ×1
GLOVE SURG SS PI 6.5 STRL IVOR (GLOVE) ×2 IMPLANT
GOWN PREVENTION PLUS XLARGE (GOWN DISPOSABLE) ×4 IMPLANT
GOWN STRL NON-REIN LRG LVL3 (GOWN DISPOSABLE) IMPLANT
GOWN STRL REIN XL XLG (GOWN DISPOSABLE) ×4 IMPLANT
HANDPIECE INTERPULSE COAX TIP (DISPOSABLE) ×1
HOOD PEEL AWAY FACE SHEILD DIS (HOOD) ×6 IMPLANT
IMMOBILIZER KNEE 22 UNIV (SOFTGOODS) ×2 IMPLANT
KIT BASIN OR (CUSTOM PROCEDURE TRAY) ×2 IMPLANT
KIT ROOM TURNOVER OR (KITS) ×2 IMPLANT
KIT SAW BLADE (KITS) ×2 IMPLANT
MANIFOLD NEPTUNE II (INSTRUMENTS) ×2 IMPLANT
NS IRRIG 1000ML POUR BTL (IV SOLUTION) ×2 IMPLANT
PACK TOTAL JOINT (CUSTOM PROCEDURE TRAY) ×2 IMPLANT
PAD ARMBOARD 7.5X6 YLW CONV (MISCELLANEOUS) ×4 IMPLANT
PAD CAST 4YDX4 CTTN HI CHSV (CAST SUPPLIES) ×1 IMPLANT
PADDING CAST COTTON 4X4 STRL (CAST SUPPLIES) ×1
PADDING CAST COTTON 6X4 STRL (CAST SUPPLIES) ×2 IMPLANT
RUBBERBAND STERILE (MISCELLANEOUS) ×2 IMPLANT
SET HNDPC FAN SPRY TIP SCT (DISPOSABLE) ×1 IMPLANT
SPONGE GAUZE 4X4 12PLY (GAUZE/BANDAGES/DRESSINGS) ×2 IMPLANT
STRIP CLOSURE SKIN 1/2X4 (GAUZE/BANDAGES/DRESSINGS) ×2 IMPLANT
SUCTION FRAZIER TIP 10 FR DISP (SUCTIONS) ×2 IMPLANT
SUT ETHIBOND NAB CT1 #1 30IN (SUTURE) ×4 IMPLANT
SUT MNCRL AB 3-0 PS2 18 (SUTURE) ×2 IMPLANT
SUT VIC AB 0 CT1 27 (SUTURE) ×1
SUT VIC AB 0 CT1 27XBRD ANBCTR (SUTURE) ×1 IMPLANT
SUT VIC AB 2-0 CT1 27 (SUTURE) ×1
SUT VIC AB 2-0 CT1 TAPERPNT 27 (SUTURE) ×1 IMPLANT
TOWEL OR 17X24 6PK STRL BLUE (TOWEL DISPOSABLE) ×2 IMPLANT
TOWEL OR 17X26 10 PK STRL BLUE (TOWEL DISPOSABLE) ×2 IMPLANT
TRAY FOLEY CATH 14FR (SET/KITS/TRAYS/PACK) ×2 IMPLANT
WATER STERILE IRR 1000ML POUR (IV SOLUTION) ×4 IMPLANT

## 2012-11-22 NOTE — Progress Notes (Signed)
Physical Therapy Evaluation Patient Details Name: Clarence Dawson MRN: [id number] DOB: 1946-07-06 Today's Date: 11/22/2012 Time: 7829-5621 PT Time Calculation (min): 31 min  PT Assessment / Plan / Recommendation Clinical Impression  Pt is 66 yo male s/p left TKA who mobilized remarkably well post-op day 0, ambulated 20' within room with KI. Initiated exercises and positioning education.  Recommend HHPT at d/c. Pt already has equipment. PT will follow acutely for strengthening and ROM of the left knee as well as progression of mobility.    PT Assessment  Patient needs continued PT services    Follow Up Recommendations  Home health PT;Supervision for mobility/OOB    Does the patient have the potential to tolerate intense rehabilitation      Barriers to Discharge None      Equipment Recommendations  None recommended by PT    Recommendations for Other Services     Frequency 7X/week    Precautions / Restrictions Precautions Precautions: Knee Precaution Comments: explained to pt no pillow under knee and locked out knees of bed Required Braces or Orthoses: Knee Immobilizer - Left Knee Immobilizer - Left: Discontinue post op day 2 Restrictions Weight Bearing Restrictions: Yes LLE Weight Bearing: Weight bearing as tolerated   Pertinent Vitals/Pain 4/10 left knee pain, VSS      Mobility  Bed Mobility Bed Mobility: Rolling Left;Left Sidelying to Sit;Sitting - Scoot to Edge of Bed Rolling Left: 5: Supervision;With rail Left Sidelying to Sit: 5: Supervision;HOB flat;With rails Sitting - Scoot to Edge of Bed: 5: Supervision Details for Bed Mobility Assistance: vc's for rolling first, for breathing throughou mvmt Transfers Transfers: Sit to Stand;Stand to Sit Sit to Stand: 4: Min assist;From bed;With upper extremity assist Stand to Sit: 4: Min assist;To chair/3-in-1;With upper extremity assist Details for Transfer Assistance: vc's for hand  placement Ambulation/Gait Ambulation/Gait Assistance: 4: Min assist Ambulation Distance (Feet): 20 Feet Assistive device: Rolling walker Ambulation/Gait Assistance Details: vc's for sequencing and reviewed WBAT status Gait Pattern: Step-to pattern Gait velocity: decreased Stairs: No Wheelchair Mobility Wheelchair Mobility: No    Shoulder Instructions     Exercises Total Joint Exercises Ankle Circles/Pumps: AROM;Both;20 reps;Seated Quad Sets: AROM;Both;10 reps;Seated   PT Diagnosis: Abnormality of gait;Acute pain  PT Problem List: Decreased strength;Decreased range of motion;Decreased mobility;Decreased knowledge of use of DME;Decreased knowledge of precautions;Pain PT Treatment Interventions: DME instruction;Gait training;Stair training;Functional mobility training;Therapeutic activities;Therapeutic exercise;Patient/family education   PT Goals Acute Rehab PT Goals PT Goal Formulation: With patient Time For Goal Achievement: 11/29/12 Potential to Achieve Goals: Good Pt will go Supine/Side to Sit: with modified independence PT Goal: Supine/Side to Sit - Progress: Goal set today Pt will go Sit to Supine/Side: with modified independence PT Goal: Sit to Supine/Side - Progress: Goal set today Pt will go Sit to Stand: with modified independence PT Goal: Sit to Stand - Progress: Goal set today Pt will go Stand to Sit: with modified independence PT Goal: Stand to Sit - Progress: Goal set today Pt will Ambulate: >150 feet;with modified independence;with rolling walker PT Goal: Ambulate - Progress: Goal set today Pt will Go Up / Down Stairs: 1-2 stairs;with supervision;with rolling walker PT Goal: Up/Down Stairs - Progress: Goal set today Pt will Perform Home Exercise Program: with supervision, verbal cues required/provided PT Goal: Perform Home Exercise Program - Progress: Goal set today  Visit Information  Last PT Received On: 11/22/12 Assistance Needed: +1    Subjective Data   Subjective: I've been through all of this with my wife for 2 surgeries  Patient Stated Goal: return home   Prior Functioning  Home Living Lives With: Spouse Available Help at Discharge: Family;Available 24 hours/day Type of Home: House Home Access: Stairs to enter [employer] of Steps: 1 Entrance Stairs-Rails: None Home Layout: One level Home Adaptive Equipment: Walker - rolling;Shower chair with back;Bedside commode/3-in-1 Additional Comments: pt reports CPM has already been delivered to his house Prior Function Level of Independence: Independent Able to Take Stairs?: Yes Driving: Yes Vocation: Retired Comments: pt has 5 horses Communication Communication: No difficulties    Cognition  Overall Cognitive Status: Appears within functional limits for tasks assessed/performed Arousal/Alertness: Awake/alert Orientation Level: Oriented X4 / Intact Behavior During Session: WFL for tasks performed    Extremity/Trunk Assessment Right Upper Extremity Assessment RUE ROM/Strength/Tone: [facility] for tasks assessed Left Upper Extremity Assessment LUE ROM/Strength/Tone: WFL for tasks assessed Right Lower Extremity Assessment RLE ROM/Strength/Tone: Within functional levels RLE Sensation: WFL - Light Touch RLE Coordination: WFL - gross motor Left Lower Extremity Assessment LLE ROM/Strength/Tone: Deficits LLE ROM/Strength/Tone Deficits: hip flex 2/5, knee ext 1/5, knee flex 2/5 LLE Sensation: WFL - Light Touch LLE Coordination: WFL - gross motor Trunk Assessment Trunk Assessment: Normal   Balance Balance Balance Assessed: No  End of Session PT - End of Session Equipment Utilized During Treatment: Gait belt;Left knee immobilizer Activity Tolerance: Patient tolerated treatment well Patient left: in chair;with call bell/phone within reach Nurse Communication: Mobility status CPM Left Knee CPM Left Knee: Off  GP   Lyanne Co, PT  Acute Rehab Services   [phone]   Lyanne Co 11/22/2012, 4:06 PM

## 2012-11-22 NOTE — Progress Notes (Signed)
UR COMPLETED  

## 2012-11-22 NOTE — Anesthesia Postprocedure Evaluation (Signed)
  Anesthesia Post-op Note  Patient: Clarence Dawson  Procedure(s) Performed: Procedure(s) (LRB) with comments: UNICOMPARTMENTAL KNEE (Left) - left unicompartmental knee arthroplasty  Patient Location: PACU  Anesthesia Type:GA combined with regional for post-op pain  Level of Consciousness: awake, alert , oriented and patient cooperative  Airway and Oxygen Therapy: Patient Spontanous Breathing  Post-op Pain: mild  Post-op Assessment: Post-op Vital signs reviewed, Patient's Cardiovascular Status Stable, Respiratory Function Stable, Patent Airway, No signs of Nausea or vomiting and Pain level controlled  Post-op Vital Signs: stable  Complications: No apparent anesthesia complications

## 2012-11-22 NOTE — Brief Op Note (Signed)
11/22/2012  9:09 AM  PATIENT:  Clarence Dawson  66 y.o. male  PRE-OPERATIVE DIAGNOSIS:  DJD LEFT KNEE  POST-OPERATIVE DIAGNOSIS:  DJD LEFT KNEE  PROCEDURE:  Procedure(s) (LRB) with comments: UNICOMPARTMENTAL KNEE (Left) - left unicompartmental knee arthroplasty  SURGEON:  Surgeon(s) and Role:    * Nilda Simmer, MD - Primary    * Eulas Post, MD - Assisting  PHYSICIAN ASSISTANT: Julien Girt PA-C  ASSISTANTS: Teryl Lucy MD,  Kirstin Shepperson PA_C   ANESTHESIA:   general  EBL:  Total I/O In: 1050 [I.V.:1050] Out: 575 [Urine:550; Blood:25]  BLOOD ADMINISTERED:none  DRAINS: (1) Hemovact drain(s) in the left knee with  Suction Clamped   LOCAL MEDICATIONS USED:  NONE  SPECIMEN:  No Specimen  DISPOSITION OF SPECIMEN:  N/A  COUNTS:  YES  TOURNIQUET:  * Missing tourniquet times found for documented tourniquets in log:  69629 *  DICTATION: .Note written in EPIC  PLAN OF CARE: Admit to inpatient   PATIENT DISPOSITION:  PACU - hemodynamically stable.   Delay start of Pharmacological VTE agent (>24hrs) due to surgical blood loss or risk of bleeding: no

## 2012-11-22 NOTE — Interval H&P Note (Signed)
History and Physical Interval Note:  11/22/2012 7:05 AM  Clarence Dawson  has presented today for surgery, with the diagnosis of DJD LEFT KNEE  The various methods of treatment have been discussed with the patient and family. After consideration of risks, benefits and other options for treatment, the patient has consented to  Procedure(s) (LRB) with comments: UNICOMPARTMENTAL KNEE (Left) as a surgical intervention .  The patient's history has been reviewed, patient examined, no change in status, stable for surgery.  I have reviewed the patient's chart and labs.  Questions were answered to the patient's satisfaction.     Salvatore Marvel A

## 2012-11-22 NOTE — Transfer of Care (Signed)
Immediate Anesthesia Transfer of Care Note  Patient: Clarence Dawson  Procedure(s) Performed: Procedure(s) (LRB) with comments: UNICOMPARTMENTAL KNEE (Left) - left unicompartmental knee arthroplasty  Patient Location: PACU  Anesthesia Type:GA combined with regional for post-op pain  Level of Consciousness: awake, alert  and oriented  Airway & Oxygen Therapy: Patient Spontanous Breathing and Patient connected to nasal cannula oxygen  Post-op Assessment: Report given to PACU RN, Post -op Vital signs reviewed and stable and Patient moving all extremities  Post vital signs: Reviewed and stable  Complications: No apparent anesthesia complications

## 2012-11-22 NOTE — Anesthesia Preprocedure Evaluation (Addendum)
Anesthesia Evaluation  Patient identified by MRN, date of birth, ID band Patient awake    Reviewed: Allergy & Precautions, H&P , NPO status , Patient's Chart, lab work & pertinent test results, reviewed documented beta blocker date and time   Airway       Dental   Pulmonary asthma , sleep apnea and Continuous Positive Airway Pressure Ventilation , former smoker,          Cardiovascular hypertension, Pt. on medications and Pt. on home beta blockers + dysrhythmias + pacemaker     Neuro/Psych negative neurological ROS  negative psych ROS   GI/Hepatic Neg liver ROS, hiatal hernia, GERD-  Medicated and Controlled,  Endo/Other  diabetes, Well Controlled, Type 2, Oral Hypoglycemic AgentsHypothyroidism   Renal/GU negative Renal ROS     Musculoskeletal  (+) Arthritis -, Osteoarthritis,    Abdominal   Peds  Hematology negative hematology ROS (+)   Anesthesia Other Findings   Reproductive/Obstetrics                          Anesthesia Physical Anesthesia Plan  ASA: III  Anesthesia Plan: General   Post-op Pain Management: MAC Combined w/ Regional for Post-op pain   Induction: Intravenous  Airway Management Planned: Oral ETT  Additional Equipment:   Intra-op Plan:   Post-operative Plan: Extubation in OR  Informed Consent: I have reviewed the patients History and Physical, chart, labs and discussed the procedure including the risks, benefits and alternatives for the proposed anesthesia with the patient or authorized representative who has indicated his/her understanding and acceptance.   Dental advisory given  Plan Discussed with: Anesthesiologist and Surgeon  Anesthesia Plan Comments:         Anesthesia Quick Evaluation

## 2012-11-22 NOTE — Preoperative (Signed)
Beta Blockers   Reason not to administer Beta Blockers:Not Applicable 

## 2012-11-22 NOTE — H&P (View-Only) (Signed)
Clarence Dawson is an 66 y.o. male.   Chief Complaint: left knee pain HPI: Clarence Dawson is a 67 year old seen for follow-up from his significant persistent left knee pain. We performed a left knee arthroscopy on 04/21/11 and he was found to have significant medial compartment degenerative changes and meniscal tearing. No degenerative changes laterally or in the patellofemoral joint. He is having significant persistent pain in the knee. We saw him on 04/27/12 and he had a course of Supartz injections without relief. He has significant medial pain with weightbearing and activity relieved by rest. The excruciating pain is getting progressively worse, limiting activities of daily living, poses a significant fall risk, and has failed multiple conservative treatments including intraarticular cortisone injections, intraarticular Supartz, bracing, medication and home physical therapy.  Past Medical History  Diagnosis Date  . Hypertension   . Allergic rhinitis   . Atrial fibrillation   . Syncope   . Sinoatrial node dysfunction   . Hyperthyroidism   . 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 Hyperlipidemia   . RBBB (right bundle branch block)     Coronary Artherosclerosis  . Tricuspid valve disorder   . Aortic valve disorder   . Carotid artery stenosis   . Diabetes   . Hemorrhage of rectum   . Pacemaker     Oct 2005 in [place].  . Asthma     since childhood- seasonal allergies induced  . OSA (obstructive sleep apnea)     done in [place] 2005- place no longer in business  . Arthritis     ostoearthritis  . Sinoatrial node dysfunction     Past Surgical History  Procedure Date  . Gallbladder surgery 12/01/2006  . Effusion of joint 01/20/2007    Left Elbow  . Rotator cuff surgery 2001    Right shoulder  . Carpal tunnel release 1994    right wrist  . Arthropathy 2005    Rebuilding of left thumb and joint   . Hemorroidectomy 2003  . Circumcision   . A-v cardiac pacemaker insertion     Sick sinus syndrome DDR pacer  .  Cardiac electrophysiology study and ablation 09/2008    for pvcs, Dr. Vesta Mixer  . Eye surgery     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-[facility]  . Left knee arthroscopy     [facility]  . Carpal tunnel release 05/04/2012    Procedure: CARPAL TUNNEL RELEASE;  Surgeon: Nicki Reaper, MD;  Location: [facility];  Service: Orthopedics;  Laterality: Left;    Family History  Problem Relation Age of Onset  . Other Father 17    Sudden Cardiac death  . Pancreatic cancer Mother    Social History:  reports that he quit smoking about 28 years ago. He does not have any smokeless tobacco history on file. He reports that he drinks about .6 ounces of alcohol per week. He reports that he does not use illicit drugs.  Allergies:  Allergies  Allergen Reactions  . Food Anaphylaxis and Shortness Of Breath    Tree nuts- breathing difficulty, seafood- breathing difficulty  . Iodine Other (See Comments)    Reaction unknown  . Voltaren (Diclofenac Sodium) Other (See Comments)    Feels like things are crawling on him   Current Outpatient Prescriptions on File Prior to Visit  Medication Sig Dispense Refill  . acetaminophen (TYLENOL) 325 MG tablet Take 650 mg by mouth every 6 (six) hours as needed. For pain/fever      .  amLODipine (NORVASC) 10 MG tablet Take 10 mg by mouth daily.        Marland Kitchen aspirin 81 MG tablet Take 81 mg by mouth daily.        . cloNIDine (CATAPRES) 0.1 MG tablet Take 0.1 mg by mouth 2 (two) times daily.      Marland Kitchen ezetimibe-simvastatin (VYTORIN) 10-20 MG per tablet Take 1 tablet by mouth at bedtime.      . Fluticasone-Salmeterol (ADVAIR DISKUS) 250-50 MCG/DOSE AEPB Inhale 1 puff into the lungs every 12 (twelve) hours.        Marland Kitchen levothyroxine (SYNTHROID, LEVOTHROID) 175 MCG tablet Take 175 mcg by mouth daily.        Marland Kitchen losartan-hydrochlorothiazide (HYZAAR) 100-25 MG per tablet Take 1 tablet by mouth daily.      . meloxicam (MOBIC) 15 MG tablet Take 15 mg by mouth  daily.      . metoprolol (LOPRESSOR) 100 MG tablet Take 100 mg by mouth 2 (two) times daily.       . Multiple Vitamin (MULTIVITAMIN) tablet Take 1 tablet by mouth daily.        . nateglinide (STARLIX) 120 MG tablet Take 120 mg by mouth 2 (two) times daily.       . metFORMIN (GLUCOPHAGE) 500 MG tablet Take 1,000 mg by mouth 2 (two) times daily with a meal.         (Not in a hospital admission)  No results found for this or any previous visit (from the past 48 hour(s)). No results found.  Review of Systems  Constitutional: Negative.   HENT: Negative.   Eyes: Negative.   Respiratory: Negative.   Cardiovascular: Negative.   Gastrointestinal: Negative.   Genitourinary: Negative.   Musculoskeletal:       Left pain  Skin: Negative.   Neurological: Negative.   Endo/Heme/Allergies: Negative.   Psychiatric/Behavioral: Negative.     Blood pressure 178/94, pulse 61, temperature 98.4 F (36.9 C), height 5\' 10"  (1.778 m), weight 112.038 kg (247 lb), SpO2 92.00%. Physical Exam  Constitutional: He is oriented to person, place, and time. He appears well-developed and well-nourished.  HENT:  Head: Normocephalic and atraumatic.  Eyes: Conjunctivae normal and EOM are normal. Pupils are equal, round, and reactive to light.  Neck: Neck supple.  Cardiovascular: Normal rate, regular rhythm and intact distal pulses.   Murmur heard. Respiratory: Effort normal and breath sounds normal.  GI: Bowel sounds are normal.  Genitourinary:       Not pertinent to current symptomatology therefore not examined.  Musculoskeletal:       Examination of his left knee reveals pain medially, 1+ crepitation, 1+ synovitis range of motion 0-125 degrees knee is stable with normal patella tracking. Vascular exam: pulses 2+ and symmetric.  Neurological: He is alert and oriented to person, place, and time.  Skin: Skin is warm and dry.  Psychiatric: He has a normal mood and affect. His behavior is normal. Judgment and  thought content normal.     Assessment Patient Active Problem List  Diagnosis  . ESSENTIAL HYPERTENSION, BENIGN  . PREMATURE VENTRICULAR CONTRACTIONS  . CARDIAC PACEMAKER IN SITU  . PVC's (premature ventricular contractions)  . Sinoatrial node dysfunction  . Hypertension  . Atrial fibrillation  . Syncope  . Hyperthyroidism  . Hyperlipidemia  . RBBB (right bundle branch block)  . Tricuspid valve disorder  . Aortic valve disorder  . Carotid artery stenosis  . Diabetes  . Pacemaker  . OSA (obstructive sleep apnea)  .  Arthritis    Plan I talk to him and his wife about this in detail. I do think he is a candidate for a unicompartmental arthroplasty. Discussed risks benefits and possible complications of the surgery in detail and he understands this completely. We told them if during surgery he has significant degenerative changes in the rest of the knee we will convert to a total knee replacement and they understand and agree. He'll has been cleared preoperatively by Dr. Sharrell Ku and Dr. Carylon Perches.   Merranda Bolls J 11/10/2012, 3:36 PM

## 2012-11-22 NOTE — Progress Notes (Signed)
Orthopedic Tech Progress Note Patient Details:  Clarence Dawson 11-Jun-1946 [id number]  CPM Left Knee CPM Left Knee: On Left Knee Flexion (Degrees): 60  Left Knee Extension (Degrees): 0  Additional Comments: trapeze bar   Cammer, Mickie Bail 11/22/2012, 11:17 AM

## 2012-11-22 NOTE — Anesthesia Procedure Notes (Addendum)
Anesthesia Regional Block:  Femoral nerve block  Pre-Anesthetic Checklist: ,, timeout performed, Correct Patient, Correct Site, Correct Laterality, Correct Procedure, Correct Position, site marked, Risks and benefits discussed,  Surgical consent,  Pre-op evaluation,  At surgeon's request and post-op pain management  Laterality: Left  Prep: Maximum Sterile Barrier Precautions used, chloraprep and alcohol swabs       Needles:  Injection technique: Single-shot  Needle Type: Stimulator Needle - 80        Needle insertion depth: 7 cm   Additional Needles:  Procedures: nerve stimulator Femoral nerve block  Nerve Stimulator or Paresthesia:  Response: 0.5 mA, 0.1 ms, 7 cm  Additional Responses:   Narrative:  Start time: 11/22/2012 7:05 AM End time: 11/22/2012 7:14 AM Injection made incrementally with aspirations every 5 mL.  Performed by: Personally  Anesthesiologist: Maren Beach MD  Additional Notes: Pt accepts procedure and risks. 25cc 0.5% Marcaine w/ epi w/o difficulty or discomfort. Pt tolerated well. Feliberto Gottron MD.   Procedure Name: Intubation Date/Time: 11/22/2012 7:29 AM Performed by: Windle Guard Pre-anesthesia Checklist: Emergency Drugs available, Patient identified, Timeout performed, Suction available and Patient being monitored Patient Re-evaluated:Patient Re-evaluated prior to inductionOxygen Delivery Method: Circle system utilized Preoxygenation: Pre-oxygenation with 100% oxygen Intubation Type: IV induction Ventilation: Mask ventilation without difficulty Laryngoscope Size: Mac and 4 Grade View: Grade II Tube type: Oral Tube size: 8.5 mm Number of attempts: 1 Airway Equipment and Method: Stylet and LTA kit utilized Placement Confirmation: ETT inserted through vocal cords under direct vision,  breath sounds checked- equal and bilateral and positive ETCO2 Secured at: 24 cm Tube secured with: Tape Dental Injury: Teeth and Oropharynx as per  pre-operative assessment

## 2012-11-23 LAB — CBC
HCT: 42.4 % (ref 39.0–52.0)
MCH: 30.2 pg (ref 26.0–34.0)
MCV: 89.5 fL (ref 78.0–100.0)
Platelets: 159 10*3/uL (ref 150–400)
RBC: 4.74 MIL/uL (ref 4.22–5.81)
WBC: 9.5 10*3/uL (ref 4.0–10.5)

## 2012-11-23 LAB — BASIC METABOLIC PANEL
BUN: 15 mg/dL (ref 6–23)
CO2: 26 mEq/L (ref 19–32)
Calcium: 8.7 mg/dL (ref 8.4–10.5)
Chloride: 102 mEq/L (ref 96–112)
Creatinine, Ser: 0.87 mg/dL (ref 0.50–1.35)
Glucose, Bld: 210 mg/dL — ABNORMAL HIGH (ref 70–99)

## 2012-11-23 MED ORDER — OXYCODONE HCL 5 MG PO TABS: ORAL_TABLET | ORAL | Status: DC

## 2012-11-23 MED ORDER — CELECOXIB 200 MG PO CAPS: 200.0000 mg | ORAL_CAPSULE | Freq: Every day | ORAL | Status: DC

## 2012-11-23 MED ORDER — DSS 100 MG PO CAPS: ORAL_CAPSULE | ORAL | Status: DC

## 2012-11-23 MED ORDER — BISACODYL 5 MG PO TBEC: DELAYED_RELEASE_TABLET | ORAL | Status: DC

## 2012-11-23 MED ORDER — ENOXAPARIN SODIUM 30 MG/0.3ML ~~LOC~~ SOLN: 30.0000 mg | Freq: Two times a day (BID) | SUBCUTANEOUS | Status: DC

## 2012-11-23 NOTE — Progress Notes (Signed)
Pt instructed on giving lovenox inj verbally repeated back instructions that were given.

## 2012-11-23 NOTE — Progress Notes (Signed)
Physical Therapy Treatment Patient Details Name: Clarence Dawson MRN: [id number] DOB: [birth year] Today's Date: 11/23/2012 Time: 4098-1191 PT Time Calculation (min): 40 min  PT Assessment / Plan / Recommendation Comments on Treatment Session  Pt admitted s/p left TKA and continues to progress with therapy. Pt able to increase ambulation distance/independence as well as perform stair negotiation this am. Pt ready for safe d/c home once medically cleared by MD.    Follow Up Recommendations  Home health PT;Supervision for mobility/OOB     Does the patient have the potential to tolerate intense rehabilitation     Barriers to Discharge        Equipment Recommendations  None recommended by PT    Recommendations for Other Services    Frequency 7X/week   Plan Discharge plan remains appropriate;Frequency remains appropriate    Precautions / Restrictions Precautions Precautions: Knee Precaution Booklet Issued: No Required Braces or Orthoses: Knee Immobilizer - Left Knee Immobilizer - Left: Discontinue post op day 2 Restrictions Weight Bearing Restrictions: Yes LLE Weight Bearing: Weight bearing as tolerated   Pertinent Vitals/Pain None    Mobility  Bed Mobility Bed Mobility: Supine to Sit Rolling Left: 5: Supervision;With rail Details for Bed Mobility Assistance: Verbal cues for safe sequence. Transfers Transfers: Sit to Stand;Stand to Sit (2 trials.) Sit to Stand: 5: Supervision;With upper extremity assist;From bed;From chair/3-in-1 Stand to Sit: 5: Supervision;With upper extremity assist;To chair/3-in-1 Details for Transfer Assistance: Verbal cues for safest hand/left LE placement. Ambulation/Gait Ambulation/Gait Assistance: 4: Min guard Ambulation Distance (Feet): 600 Feet Assistive device: Rolling walker Ambulation/Gait Assistance Details: Guarding for balance with cues to progress from step-to to step-through sequence with initial contact on left heel with heel  strike. Gait Pattern: Step-to pattern;Step-through pattern;Decreased step length - left;Decreased stance time - left;Trunk flexed Stairs: Yes Stairs Assistance: 4: Min assist Stairs Assistance Details (indicate cue type and reason): Assist to steady RW with cues for "up with good, down with bad." Stair Management Technique: Step to pattern;Backwards;With walker Number of Stairs: 2  Wheelchair Mobility Wheelchair Mobility: No    Exercises Total Joint Exercises Ankle Circles/Pumps: AROM;Left;10 reps;Supine Quad Sets: AROM;Left;10 reps;Supine Heel Slides: AAROM;Left;10 reps;Supine Hip ABduction/ADduction: AROM;Left;10 reps;Supine Straight Leg Raises: AROM;Left;10 reps;Supine Goniometric ROM: AA/ROM left knee 0-90 degrees.   PT Diagnosis:    PT Problem List:   PT Treatment Interventions:     PT Goals Acute Rehab PT Goals PT Goal Formulation: With patient Time For Goal Achievement: 11/29/12 Potential to Achieve Goals: Good PT Goal: Supine/Side to Sit - Progress: Progressing toward goal PT Goal: Sit to Stand - Progress: Progressing toward goal PT Goal: Stand to Sit - Progress: Progressing toward goal PT Goal: Ambulate - Progress: Progressing toward goal PT Goal: Up/Down Stairs - Progress: Progressing toward goal PT Goal: Perform Home Exercise Program - Progress: Progressing toward goal  Visit Information  Last PT Received On: 11/23/12 Assistance Needed: +1    Subjective Data  Subjective: "I am feeling great. Just a bit stiff." Patient Stated Goal: return home   Cognition  Overall Cognitive Status: Appears within functional limits for tasks assessed/performed Arousal/Alertness: Awake/alert Orientation Level: Oriented X4 / Intact Behavior During Session: [facility] for tasks performed    Balance  Balance Balance Assessed: No  End of Session PT - End of Session Equipment Utilized During Treatment: Gait belt;Left knee immobilizer Activity Tolerance: Patient tolerated treatment  well Patient left: in chair;with call bell/phone within reach Nurse Communication: Mobility status   GP     Cephus Shelling  11/23/2012, 9:07 AM  11/23/2012 Cephus Shelling, PT, DPT [phone]

## 2012-11-23 NOTE — Discharge Summary (Signed)
Patient ID: Clarence Dawson MRN: [id number] DOB/AGE: 05/18/1946 66 y.o.  Admit date: 11/22/2012 Discharge date: 11/23/2012  Admission Diagnoses:  Principal Problem:  *Left knee DJD Active Problems:  Essential hypertension, benign  PREMATURE VENTRICULAR CONTRACTIONS  Cardiac pacemaker in situ  Sinoatrial node dysfunction  Hyperthyroidism  Hyperlipidemia  Tricuspid valve disorder  Aortic valve disorder  Diabetes  OSA (obstructive sleep apnea)  Arthritis   Discharge Diagnoses:  Same  Past Medical History  Diagnosis Date  . Hypertension   . Allergic rhinitis   . Atrial fibrillation   . Syncope   . Sinoatrial node dysfunction   . Hyperthyroidism   . 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 Hyperlipidemia   . RBBB (right bundle branch block)     Coronary Artherosclerosis  . Tricuspid valve disorder   . Aortic valve disorder   . Carotid artery stenosis   . Diabetes   . Hemorrhage of rectum   . Pacemaker     Oct 2005 in [place].  . Asthma     since childhood- seasonal allergies induced  . OSA (obstructive sleep apnea)     done in [place] 2005- place no longer in business  . Arthritis     ostoearthritis  . Sinoatrial node dysfunction   . Hypothyroidism   . GERD (gastroesophageal reflux disease)   . H/O hiatal hernia     Surgeries: Procedure(s): UNICOMPARTMENTAL KNEE on 11/22/2012   Consultants:    Discharged Condition: Improved  Hospital Course: Clarence Dawson is an 66 y.o. male who was admitted 11/22/2012 for operative treatment ofLeft knee DJD. Patient has severe unremitting pain that affects sleep, daily activities, and work/hobbies. After pre-op clearance the patient was taken to the operating room on 11/22/2012 and underwent  Procedure(s): UNICOMPARTMENTAL KNEE.    Patient was given perioperative antibiotics: Anti-infectives     Start     Dose/Rate Route Frequency Ordered Stop   11/22/12 1330   ceFAZolin (ANCEF) IVPB 2 g/50 mL premix        2 g 100 mL/hr over 30 Minutes  Intravenous Every 6 hours 11/22/12 1133 11/22/12 2223   11/22/12 0835   cefUROXime (ZINACEF) injection  Status:  Discontinued          As needed 11/22/12 0835 11/22/12 0929   11/21/12 1338   ceFAZolin (ANCEF) 3 g in dextrose 5 % 50 mL IVPB        3 g 160 mL/hr over 30 Minutes Intravenous 60 min pre-op 11/21/12 1338 11/22/12 0715           Patient was given sequential compression devices, early ambulation, and chemoprophylaxis to prevent DVT.  Patient benefited maximally from hospital stay and there were no complications.    Recent vital signs: Patient Vitals for the past 24 hrs:  BP Temp Temp src Pulse Resp SpO2  11/23/12 0400 - - - - 18  99 %  11/23/12 0306 158/62 mmHg 98.1 F (36.7 C) Oral 71  18  99 %  11/23/12 0000 - - - - 18  99 %  11/22/12 2327 145/76 mmHg 97 F (36.1 C) - 65  18  99 %  11/22/12 2137 - - - - - 98 %  11/22/12 2000 - - - - 18  98 %  11/22/12 1400 141/70 mmHg 97.8 F (36.6 C) - 60  18  93 %  11/22/12 1115 137/69 mmHg - - 55  16  95 %  11/22/12 1100 131/57 mmHg - - 60  12  99 %  11/22/12 1045 142/65 mmHg - - 60  7  92 %  11/22/12 1030 141/58 mmHg - - 60  13  100 %  11/22/12 1015 129/82 mmHg - - 60  9  99 %  11/22/12 1000 137/64 mmHg - - 60  15  100 %  11/22/12 0945 131/63 mmHg - - 60  10  95 %  11/22/12 0941 - 97.8 F (36.6 C) - 60  26  95 %     Recent laboratory studies:  [facility] 11/23/12 0613  WBC 9.5  HGB 14.3  HCT 42.4  PLT 159  NA 138  K 4.3  CL 102  CO2 26  BUN 15  CREATININE 0.87  GLUCOSE 210*  INR --  CALCIUM 8.7     Discharge Medications:     Medication List     As of 11/23/2012  8:53 AM    STOP taking these medications         aspirin 81 MG tablet      meloxicam 15 MG tablet   Commonly known as: MOBIC      TAKE these medications         acetaminophen 325 MG tablet   Commonly known as: TYLENOL   Take 650 mg by mouth every 6 (six) hours as needed. For pain/fever      ADVAIR DISKUS 250-50 MCG/DOSE Aepb   Generic  drug: Fluticasone-Salmeterol   Inhale 1 puff into the lungs every 12 (twelve) hours.      amLODipine 10 MG tablet   Commonly known as: NORVASC   Take 10 mg by mouth daily.      bisacodyl 5 MG EC tablet   Commonly known as: DULCOLAX   Take 2 tablets with dinner until bowel movement.  LAXITIVE      celecoxib 200 MG capsule   Commonly known as: CELEBREX   Take 1 capsule (200 mg total) by mouth daily.      cloNIDine 0.1 MG tablet   Commonly known as: CATAPRES   Take 0.1 mg by mouth 2 (two) times daily.      DSS 100 MG Caps   1 tablet 2 times a day while on narcotics.  STOOL SOFTENER      enoxaparin 30 MG/0.3ML injection   Commonly known as: LOVENOX   Inject 0.3 mLs (30 mg total) into the skin every 12 (twelve) hours.      ezetimibe-simvastatin 10-20 MG per tablet   Commonly known as: VYTORIN   Take 1 tablet by mouth at bedtime.      levothyroxine 175 MCG tablet   Commonly known as: SYNTHROID, LEVOTHROID   Take 175 mcg by mouth daily.      losartan-hydrochlorothiazide 100-25 MG per tablet   Commonly known as: HYZAAR   Take 1 tablet by mouth daily.      metFORMIN 500 MG tablet   Commonly known as: GLUCOPHAGE   Take 1,000 mg by mouth 2 (two) times daily with a meal.      metoprolol 100 MG tablet   Commonly known as: LOPRESSOR   Take 100 mg by mouth 2 (two) times daily.      multivitamin tablet   Take 1 tablet by mouth daily.      nateglinide 120 MG tablet   Commonly known as: STARLIX   Take 120 mg by mouth 2 (two) times daily.      oxyCODONE 5 MG immediate release tablet   Commonly known as: Oxy IR/ROXICODONE   1-2  tablets every 4-6 hrs as needed for pain      ranitidine 75 MG tablet   Commonly known as: ZANTAC   Take 75 mg by mouth 2 (two) times daily.        Diagnostic Studies: Dg Chest 2 View  11/15/2012  *RADIOLOGY REPORT*  Clinical Data: 66 year old male hypertension.  Diabetes. Preoperative study for left knee surgery.  CHEST - 2 VIEW  Comparison:  04/16/2011.  Findings: Stable left chest dual lead cardiac pacemaker.  Stable lung volumes.  Stable cardiac size and mediastinal contours.  No pneumothorax or pulmonary edema.  No pleural effusion or acute pulmonary opacity.  Lung volumes are stable but at the upper limits of normal or hyperinflated. Stable visualized osseous structures.  IMPRESSION: No acute cardiopulmonary abnormality.   Original Report Authenticated By: Erskine Speed, M.D.     Disposition: 01-Home or Self Care      Discharge Orders    Future Orders Please Complete By Expires   Diet - low sodium heart healthy      Call MD / Call 911      Comments:   If you experience chest pain or shortness of breath, CALL 911 and be transported to the hospital emergency room.  If you develope a fever above 101 F, pus (white drainage) or increased drainage or redness at the wound, or calf pain, call your surgeon's office.   Constipation Prevention      Comments:   Drink plenty of fluids.  Prune juice may be helpful.  You may use a stool softener, such as Colace (over the counter) 100 mg twice a day.  Use MiraLax (over the counter) for constipation as needed.   Increase activity slowly as tolerated      Discharge instructions      Comments:   Partial Knee Replacement Care After Refer to this sheet in the next few weeks. These discharge instructions provide you with general information on caring for yourself after you leave the hospital. Your caregiver may also give you specific instructions. Your treatment has been planned according to the most current medical practices available, but unavoidable complications sometimes occur. If you have any problems or questions after discharge, please call your caregiver. Regaining a near full range of motion of your knee within the first 3 to 6 weeks after surgery is critical. HOME CARE INSTRUCTIONS  You may resume a normal diet and activities as directed.  Perform exercises as directed.  Place yellow  foam block, yellow side up under heel at all times except when in CPM or when walking.  DO NOT modify, tear, cut, or change in any way. You will receive physical therapy daily  Take showers instead of baths until informed otherwise.  Change bandages (dressings)daily Do not take over-the-counter or prescription medicines for pain, discomfort, or fever. Eat a well-balanced diet.  Avoid lifting or driving until you are instructed otherwise.  Make an appointment to see your caregiver for stitches (suture) or staple removal as directed.  If you have been sent home with a continuous passive motion machine (CPM machine), 0-90 degrees 6 hrs a day   2 hrs a shift SEEK MEDICAL CARE IF: You have swelling of your calf or leg.  You develop shortness of breath or chest pain.  You have redness, swelling, or increasing pain in the wound.  There is pus or any unusual drainage coming from the surgical site.  You notice a bad smell coming from the surgical site or dressing.  The surgical site breaks open after sutures or staples have been removed.  There is persistent bleeding from the suture or staple line.  You are getting worse or are not improving.  You have any other questions or concerns.  SEEK IMMEDIATE MEDICAL CARE IF:  You have a fever.  You develop a rash.  You have difficulty breathing.  You develop any reaction or side effects to medicines given.  Your knee motion is decreasing rather than improving.  MAKE SURE YOU:  Understand these instructions.  Will watch your condition.  Will get help right away if you are not doing well or get worse.   CPM      Comments:   Continuous passive motion machine (CPM):      Use the CPM from 0 to 90 for 6 hours per day.       You may break it up into 2 or 3 sessions per day.      Use CPM for 2 weeks or until you are told to stop.   TED hose      Comments:   Use stockings (TED hose) for 2 weeks on both leg(s).  You may remove them at night for sleeping.     Change dressing      Comments:   Change the dressing daily with sterile 4 x 4 inch gauze dressing and apply TED hose.  You may clean the incision with alcohol prior to redressing.   Do not put a pillow under the knee. Place it under the heel.      Comments:   Place yellow foam block, yellow side up under heel at all times except when in CPM or when walking.  DO NOT modify, tear, cut, or change in any way the yellow foam block.      Follow-up Information    Follow up with Nilda Simmer, MD. On 11/29/2012. (appt time 3:30)    Contact information:   [address] [place] [address] [phone]           Signed: Pascal Lux 11/23/2012, 8:53 AM

## 2012-11-23 NOTE — Progress Notes (Signed)
PT Treatment Note:   11/23/12 1034  PT Visit Information  Last PT Received On 11/23/12  Assistance Needed +1  PT Time Calculation  PT Start Time 0955  PT Stop Time 1010  PT Time Calculation (min) 15 min  Subjective Data  Subjective "I think I can do this again."  Patient Stated Goal return home  Precautions  Precautions Knee  Precaution Booklet Issued No  Required Braces or Orthoses Knee Immobilizer - Left  Knee Immobilizer - Left Discontinue post op day 2  Restrictions  Weight Bearing Restrictions Yes  LLE Weight Bearing WBAT  Cognition  Overall Cognitive Status Appears within functional limits for tasks assessed/performed  Arousal/Alertness Awake/alert  Orientation Level Oriented X4 / Intact  Behavior During Session [facility] for tasks performed  Bed Mobility  Bed Mobility Sit to Supine  Sit to Supine 5: Supervision  Details for Bed Mobility Assistance Verbal cues for safe sequence. Pt able to move left LE onto bed without assist.  Transfers  Transfers Sit to Stand;Stand to Sit  Sit to Stand 6: Modified independent (Device/Increase time)  Stand to Sit 6: Modified independent (Device/Increase time)  Ambulation/Gait  Ambulation/Gait Assistance 4: Min guard  Ambulation Distance (Feet) 250 Feet  Assistive device Rolling walker  Ambulation/Gait Assistance Details Guarding for balance due to ambulating without KI per MD allowance this trial. Pt without any buckling of left noted. Cues for continued step-through sequence and tall posture.  Gait Pattern Step-through pattern;Decreased step length - left;Decreased stance time - left;Trunk flexed  Stairs No  Wheelchair Mobility  Wheelchair Mobility No  Balance  Balance Assessed No  PT - End of Session  Equipment Utilized During Treatment Gait belt  Activity Tolerance Patient tolerated treatment well  Patient left in bed;in CPM;with call bell/phone within reach (In CPM 0-70 degrees.)  Nurse Communication Mobility status  PT -  Assessment/Plan  Comments on Treatment Session Pt admitted s/p left TKA and able to progress with ambulation without KI this session. Pt motivated and ready for safe d/c home once medically cleared by MD.  PT Plan Discharge plan remains appropriate;Frequency remains appropriate  PT Frequency 7X/week  Follow Up Recommendations Home health PT;Supervision for mobility/OOB  Equipment Recommended None recommended by PT  Acute Rehab PT Goals  PT Goal Formulation With patient  Time For Goal Achievement 11/29/12  Potential to Achieve Goals Good  PT Goal: Sit to Supine/Side - Progress Progressing toward goal  PT Goal: Sit to Stand - Progress Met  PT Goal: Stand to Sit - Progress Met  PT Goal: Ambulate - Progress Progressing toward goal  PT General Charges  $$ ACUTE PT VISIT 1 Procedure  PT Treatments  $Gait Training 8-22 mins    11/23/2012 Cephus Shelling, PT, DPT [phone]

## 2012-11-23 NOTE — Evaluation (Signed)
Occupational Therapy Evaluation Patient Details Name: Clarence Dawson MRN: [id number] DOB: 07-21-1946 Today's Date: 11/23/2012 Time: 0943-1000 (additional 10 minutes in tub transfer) OT Time Calculation (min): 17 min  OT Assessment / Plan / Recommendation Clinical Impression  66 yo male s/p Lt TKA that is adequate level for d/c home and no skilled OT required. OT to sign off    OT Assessment  Patient does not need any further OT services    Follow Up Recommendations  No OT follow up    Barriers to Discharge      Equipment Recommendations  None recommended by OT    Recommendations for Other Services    Frequency       Precautions / Restrictions Precautions Precautions: Knee Precaution Booklet Issued: No Required Braces or Orthoses: Knee Immobilizer - Left Knee Immobilizer - Left: Discontinue post op day 2 Restrictions Weight Bearing Restrictions: Yes LLE Weight Bearing: Weight bearing as tolerated   Pertinent Vitals/Pain Soreness behind the knee cap minimal    ADL  Eating/Feeding: Independent Where Assessed - Eating/Feeding: Chair Lower Body Dressing: Modified independent Where Assessed - Lower Body Dressing: Unsupported sitting (don boxers over [employer] don) Toilet Transfer: Supervision/safety Statistician Method: Sit to Barista: Regular height toilet Toileting - Architect and Hygiene: Supervision/safety Where Assessed - Engineer, mining and Hygiene: Sit to stand from 3-in-1 or toilet Tub/Shower Transfer: Supervision/safety Tub/Shower Transfer Method: Science writer: Counsellor Used: Gait belt;Rolling walker;Knee Immobilizer Transfers/Ambulation Related to ADLs: pt ambulating supervision level with KI to restroom ADL Comments: pt is at adequate level for d/c planning    OT Diagnosis:    OT Problem List:   OT Treatment Interventions:     OT Goals    Visit Information  Last OT Received On: 11/23/12 Assistance Needed: +1    Subjective Data  Subjective: "my wife has had all her surgeries and this time she gets to help me"- pt plans to have wife (A) at d/c with [facility] Patient Stated Goal: to go home today   Prior Functioning     Home Living Lives With: Spouse Available Help at Discharge: Family;Available 24 hours/day Type of Home: House Home Access: Stairs to enter [employer] of Steps: 1 Entrance Stairs-Rails: None Home Layout: One level Home Adaptive Equipment: Walker - rolling;Shower chair with back;Bedside commode/3-in-1 Additional Comments: pt reports CPM has already been delivered to his house Prior Function Level of Independence: Independent Able to Take Stairs?: Yes Driving: Yes Vocation: Retired Comments: pt has 5 horses Communication Communication: No difficulties Dominant Hand: Right         Vision/Perception     Cognition  Overall Cognitive Status: Appears within functional limits for tasks assessed/performed Arousal/Alertness: Awake/alert Orientation Level: Oriented X4 / Intact Behavior During Session: [facility] for tasks performed    Extremity/Trunk Assessment Right Upper Extremity Assessment RUE ROM/Strength/Tone: [facility] for tasks assessed Left Upper Extremity Assessment LUE ROM/Strength/Tone: [facility] for tasks assessed     Mobility Bed Mobility Bed Mobility: Not assessed (PT to assist s/p trail without KI) Rolling Left: 5: Supervision;With rail Details for Bed Mobility Assistance: Verbal cues for safe sequence. Transfers Sit to Stand: 5: Supervision Stand to Sit: 5: Supervision Details for Transfer Assistance: wfl     Shoulder Instructions     Exercise Total Joint Exercises Ankle Circles/Pumps: AROM;Left;10 reps;Supine Quad Sets: AROM;Left;10 reps;Supine Heel Slides: AAROM;Left;10 reps;Supine Hip ABduction/ADduction: AROM;Left;10 reps;Supine Straight Leg Raises: AROM;Left;10 reps;Supine Goniometric ROM:  AA/ROM left knee 0-90 degrees.  Balance Balance Balance Assessed: No   End of Session OT - End of Session Activity Tolerance: Patient tolerated treatment well Patient left:  (with PT) Nurse Communication: Mobility status  GO     [name] [facility] 11/23/2012, 10:03 AM Pager: [phone]

## 2012-11-23 NOTE — Progress Notes (Signed)
CARE MANAGEMENT NOTE 11/23/2012  Patient:  Clarence Dawson, Clarence Dawson   Account Number:  [id number]  Date Initiated:  11/23/2012  Documentation initiated by:  Vance Peper  Subjective/Objective Assessment:   66 yr old male s/p left total unicompartmental arthroplasty.     Action/Plan:   CM spoke to patient and wife concerning home health needs and DME needs. Choice offered. Patient states he has rolling walkerx3, 3in1,tub bench. CPM to be delivered.   Anticipated DC Date:  11/23/2012   Anticipated DC Plan:  HOME W HOME HEALTH SERVICES      DC Planning Services  CM consult      [facility] Choice  HOME HEALTH   Choice offered to / List presented to:  C-1 Patient        HH arranged  HH-2 PT      [facility] agency  [facility]   Status of service:  Completed, signed off Medicare Important Message given?   (If response is "NO", the following Medicare IM given date fields will be blank) Date Medicare IM given:   Date Additional Medicare IM given:    Discharge Disposition:  HOME W HOME HEALTH SERVICES  Per UR Regulation:    If discussed at Long Length of Stay Meetings, dates discussed:    Comments:

## 2012-11-24 DIAGNOSIS — I10 Essential (primary) hypertension: Secondary | ICD-10-CM | POA: Diagnosis not present

## 2012-11-24 DIAGNOSIS — E119 Type 2 diabetes mellitus without complications: Secondary | ICD-10-CM | POA: Diagnosis not present

## 2012-11-24 DIAGNOSIS — M159 Polyosteoarthritis, unspecified: Secondary | ICD-10-CM | POA: Diagnosis not present

## 2012-11-24 DIAGNOSIS — R262 Difficulty in walking, not elsewhere classified: Secondary | ICD-10-CM | POA: Diagnosis not present

## 2012-11-24 DIAGNOSIS — Z471 Aftercare following joint replacement surgery: Secondary | ICD-10-CM | POA: Diagnosis not present

## 2012-11-24 DIAGNOSIS — IMO0001 Reserved for inherently not codable concepts without codable children: Secondary | ICD-10-CM | POA: Diagnosis not present

## 2012-11-24 LAB — GLUCOSE, CAPILLARY: Glucose-Capillary: 171 mg/dL — ABNORMAL HIGH (ref 70–99)

## 2012-11-24 NOTE — Op Note (Signed)
NAMEAMARA, Clarence Dawson NO.:  [id number]  MEDICAL RECORD NO.:  [id number]  LOCATION:  [id number]                        FACILITY:  [facility]  PHYSICIAN:  Elana Alm. Thurston Hole, M.D. 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  PROCEDURES: 1. Left knee unicompartmental arthroplasty using Oxford system with     size medium femur with size D tibia with #3 polyethylene insert. 2. Zinacef impregnated cement.  SURGEON:  Elana Alm. Thurston Hole, M.D.  ASSISTANT:  Eulas Post, MD and Julien Girt, PA-C  ANESTHESIA:  General.  OPERATIVE TIME:  1 hour and 15 minutes.  COMPLICATIONS:  None.  DESCRIPTION OF PROCEDURE:  Mr. Pensinger was brought to the operating room on November 22, 2012, after a femoral nerve block was placed in the holding room by Anesthesia.  He was placed on the operative table in supine position.  He received Ancef 2 g IV preoperatively for prophylaxis.  After being placed under general anesthesia, he had a Foley catheter placed under sterile conditions.  His left knee was examined.  He had range of motion from -5 to 125 degrees with moderate varus deformity, knee stable, ligamentous exam with normal patellar tracking.  Left leg was prepped using sterile ChloraPrep and draped using sterile technique.  Time-out procedure was called and the correct left knee identified.  Left leg was exsanguinated and the thigh tourniquet elevated to 365 mm.  Initially, through a 5-6 cm longitudinal incision based over the anteromedial aspect of the patella and patellar tendon, initial exposure was made.  The underlying subcutaneous tissues were incised along with skin incision.  A median arthrotomy was performed  revealing an excessive amount of normal-appearing joint fluid. The articular surfaces were inspected.  He had grade 4 changes medially. He had grade 2 and mild grade 3 changes in the patellofemoral joint, and no significant articular cartilage damage laterally.  The ACL and PCL were intact.  The medial meniscus was resected and then a number medium sizing spoon was placed into the medial compartment, which gave excellent fit and this was used as a jig to resect 7 mm off the proximal tibia based on the depth of the medial femoral condyle articular cartilage.  Both vertical cut and then horizontal cuts were made in standard fashion resecting the proximal medial tibial fragment.  It was sized and found to be a size D for the appropriate size.  After this was done, then the drill holes were placed in the medial femoral condyle using the intramedullary guide and the femoral guide in the central portion of the femur.  After this was done, then the posterior femoral cut was made using the femoral cutting guide.  At this point, then the 0 spigot was placed in the femoral hole and the milling was carried out to the 0 size.  At this point with this the D tibial trial in place and the medium femoral trial in place, the knee was tested for  sizing and balancing.  A #3 size was placed in 90 degrees of flexion, which gave excellent position and full extension.  There was only a 1 mm spacer that could be placed in 20 degrees of flexion.  Thus, a #2 spigot was placed back in the distal femur and this milling was carried out.  After this was done, then again the tibial trial was again placed and the femoral trial, and then with the #3 tibial spacer in place in 90 degrees of flexion and then again in 20 degrees of flexion, this gave excellent balancing and excellent stability without overtightening.  After this was done, then the impingement guide was placed on the distal femur and these cuts were made.  At  this point, the tibial keel was prepared using the tibial guide and the toothbrush saw.  After this was done, then with the all the trial components in place and the #3 tibial spacer, the knee was brought through a full range of motion and found to be stable with excellent correction of his varus deformity without overtightening, but excellent stability and tracking.  At this point, the trial components were removed, the knee was jet lavage, irrigated with 3 liters of saline and then the proximal tibial size D component was placed with cement backing with excellent fit and hammered into position with excess cement being removed from around the edges.  The medium femoral component with cement backing was hammered into position also with an excellent fit, with excess cement being removed from around the edges, and then the #3 tibial spacer was placed.  Knee brought through a full range of motion, found to be stable with excellent correction of his flexion and varus deformities and normal tracking of the polyethylene spacer.  At this point, it was felt that all the components were of excellent size, fit, and stability.  After further irrigation was carried out and the cement hardened, then the arthrotomy was closed with running #2 Ethibond suture over medium Hemovac drains.  Subcutaneous tissues were closed with 0 and 2-0 Vicryl, subcuticular layer was closed with 4-0 Monocryl.  Sterile dressings were applied and a long-leg splint.  Tourniquet was released. The patient awakened, extubated, and taken to the recovery room in stable condition.  Needle and sponge counts were correct x2 at the end of the case.     Kenyah Luba A. Thurston Hole, M.D.     RAW/MEDQ  D:  11/23/2012  T:  11/24/2012  Job:  [id number]

## 2012-11-25 DIAGNOSIS — IMO0001 Reserved for inherently not codable concepts without codable children: Secondary | ICD-10-CM | POA: Diagnosis not present

## 2012-11-25 DIAGNOSIS — R262 Difficulty in walking, not elsewhere classified: Secondary | ICD-10-CM | POA: Diagnosis not present

## 2012-11-25 DIAGNOSIS — Z471 Aftercare following joint replacement surgery: Secondary | ICD-10-CM | POA: Diagnosis not present

## 2012-11-25 DIAGNOSIS — I10 Essential (primary) hypertension: Secondary | ICD-10-CM | POA: Diagnosis not present

## 2012-11-25 DIAGNOSIS — E119 Type 2 diabetes mellitus without complications: Secondary | ICD-10-CM | POA: Diagnosis not present

## 2012-11-25 DIAGNOSIS — M159 Polyosteoarthritis, unspecified: Secondary | ICD-10-CM | POA: Diagnosis not present

## 2012-11-29 DIAGNOSIS — E119 Type 2 diabetes mellitus without complications: Secondary | ICD-10-CM | POA: Diagnosis not present

## 2012-11-29 DIAGNOSIS — Z96659 Presence of unspecified artificial knee joint: Secondary | ICD-10-CM | POA: Diagnosis not present

## 2012-11-29 DIAGNOSIS — Z471 Aftercare following joint replacement surgery: Secondary | ICD-10-CM | POA: Diagnosis not present

## 2012-12-02 ENCOUNTER — Ambulatory Visit (HOSPITAL_COMMUNITY)
Admission: RE | Admit: 2012-12-02 | Discharge: 2012-12-02 | Disposition: A | Discharge status: Home / Self Care | Payer: Medicare Other | Source: Ambulatory Visit | Attending: Orthopedic Surgery | Admitting: Orthopedic Surgery

## 2012-12-02 DIAGNOSIS — E119 Type 2 diabetes mellitus without complications: Secondary | ICD-10-CM | POA: Diagnosis not present

## 2012-12-02 DIAGNOSIS — IMO0001 Reserved for inherently not codable concepts without codable children: Secondary | ICD-10-CM | POA: Insufficient documentation

## 2012-12-02 DIAGNOSIS — M25569 Pain in unspecified knee: Secondary | ICD-10-CM | POA: Diagnosis not present

## 2012-12-02 DIAGNOSIS — I10 Essential (primary) hypertension: Secondary | ICD-10-CM | POA: Diagnosis not present

## 2012-12-02 DIAGNOSIS — M6281 Muscle weakness (generalized): Secondary | ICD-10-CM | POA: Diagnosis not present

## 2012-12-02 NOTE — Progress Notes (Signed)
Physical Therapy Evaluation  Patient Details  Name: Clarence Dawson MRN: [id number] Date of Birth: June 26, 1946  Today's Date: 12/02/2012 Time: 1020-1108 PT Time Calculation (min): 48 min Charges: 1 eval, 8' TE Visit#: 1  of 8   Re-eval: 01/01/13 Assessment Diagnosis: R unicompartmental replacement Surgical Date: 11/22/12 Next MD Visit: Dr. Thurston Hole - 12/07/12  Authorization: MEDICARE  Authorization Time Period:    Authorization Visit#: 1  of 8    Past Medical History:  Past Medical History  Diagnosis Date  . Hypertension   . Allergic rhinitis   . Atrial fibrillation   . Syncope   . Sinoatrial node dysfunction   . Hyperthyroidism   . 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 Hyperlipidemia   . RBBB (right bundle branch block)     Coronary Artherosclerosis  . Tricuspid valve disorder   . Aortic valve disorder   . Carotid artery stenosis   . Diabetes   . Hemorrhage of rectum   . Pacemaker     Oct 2005 in [place].  . Asthma     since childhood- seasonal allergies induced  . OSA (obstructive sleep apnea)     done in [place] 2005- place no longer in business  . Arthritis     ostoearthritis  . Sinoatrial node dysfunction   . Hypothyroidism   . GERD (gastroesophageal reflux disease)   . H/O hiatal hernia    Past Surgical History:  Past Surgical History  Procedure Date  . Gallbladder surgery 12/01/2006  . Effusion of joint 01/20/2007    Left Elbow- pt denies  . Rotator cuff surgery 2001    Right shoulder  . Carpal tunnel release 1994    right wrist  . Arthropathy 2005    Rebuilding of left thumb and joint   . Hemorroidectomy 2003  . Circumcision   . A-v cardiac pacemaker insertion     Sick sinus syndrome DDR pacer  . Cardiac electrophysiology study and ablation 09/2008    for pvcs, Dr. Vesta Mixer  . Left knee arthroscopy     [facility]  . Carpal tunnel release 05/04/2012    Procedure: CARPAL TUNNEL RELEASE;  Surgeon: Nicki Reaper, MD;  Location: [facility];  Service: Orthopedics;  Laterality: Left;  . Cardiac catheterization   . Eye surgery     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-[facility]  . Eye surgery 2012    Left eye Corneal transplant- partial- Cataract  . Tonsillectomy   . Cholecystectomy 1994  . Partial knee arthroplasty 11/22/2012    Procedure: UNICOMPARTMENTAL KNEE;  Surgeon: Nilda Simmer, MD;  Location: [facility] OR;  Service: Orthopedics;  Laterality: Left;  left unicompartmental knee arthroplasty    Subjective Symptoms/Limitations Pertinent History: Pt is referred to PT for partial knee replacement on 11/22/12 secondary to knee OA.  He had conservative treatment with a knee scope and injections but did not solve the pain.  Today he comes in ambulating independently.  his c/co is stiffness in the am, currently taking a 10 day antibiotic for infection to knee. Patient Stated Goals: I want to be able to lift stuff around my house again.   Prior Functioning  Home Living Lives With: Spouse Type of Home: House Home Access: Stairs to enter Entrance Stairs-Number of Steps: 1 Home Layout: Two level Alternate Level Stairs-Number of Steps: 13  Cognition/Observation Observation/Other Assessments Observations: errythema to incision sight  Sensation/Coordination/Flexibility/Functional Tests Functional Tests Functional Tests: 5 STS: 11 sec  Assessment LLE AROM (degrees) Left Knee  Extension: 0  Left Knee Flexion: 116  LLE Strength Left Hip Flexion: 5/5 Left Hip Extension: 4/5 Left Hip ABduction: 3+/5 Left Knee Flexion: 5/5 Left Knee Extension: 5/5 Palpation Palpation: L knee: mild pain and tenderness with joint effusion  Mobility/Balance  Ambulation/Gait Ambulation/Gait Assistance: 7: Independent Gait Pattern: Decreased stance time - left;Lateral hip instability Static Standing Balance Single Leg Stance - Right Leg: 5  Single Leg Stance - Left Leg: 5  Tandem Stance - Right Leg: 5  Tandem Stance - Left Leg: 5  Rhomberg  - Eyes Opened: 10  Rhomberg - Eyes Closed: 10    Exercise/Treatments Supine Short Arc Quad Sets: 10 reps (10 sec holds) Straight Leg Raises: 20 reps Sidelying Hip ABduction: Left;10 reps Prone  Hamstring Curl: 10 reps Hip Extension: 10 reps    Physical Therapy Assessment and Plan PT Assessment and Plan Clinical Impression Statement: Pt is a 66 year old male referred to PT s/p left partial knee replacement.   Pt will benefit from skilled therapeutic intervention in order to improve on the following deficits: Decreased strength;Abnormal gait;Decreased activity tolerance Rehab Potential: Good PT Frequency: Min 2X/week PT Duration: 4 weeks PT Treatment/Interventions: Gait training;Stair training;Functional mobility training;Therapeutic activities;Therapeutic exercise;Balance training;Neuromuscular re-education;Patient/family education;Manual techniques;Modalities PT Plan: Follow High Intensity Protocol. TM for activity tolerance, squats, heel and toe raises, knee flexion, stair training, flexibility (HS stretch, hip flexor stretch, gastroc/solues stretch). Progess to forward lunges, SL squat, vector stance, balance on foam, cybex.     Goals Home Exercise Program Pt will Perform Home Exercise Program: Independently PT Goal: Perform Home Exercise Program - Progress: Goal set today PT Short Term Goals Time to Complete Short Term Goals: 2 weeks PT Short Term Goal 1: Pt will improve LE strength by 1 muscle grade.  PT Short Term Goal 2: Pt will improve static stance x30 sec on solid surface. PT Short Term Goal 3: Pt will ambulate with improved gait mechanics.  PT Long Term Goals Time to Complete Long Term Goals: 4 weeks PT Long Term Goal 1: Pt will improve her LEFS to 57/80 for improved QOL. 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  Long Term Goal 3: Pt will improve  LE strength and perform 5 half kneel to stands independently.   Problem List Patient Active Problem List  Diagnosis  . Essential hypertension, benign  . PREMATURE VENTRICULAR CONTRACTIONS  . Cardiac pacemaker in situ  . PVC's (premature ventricular contractions)  . Sinoatrial node dysfunction  . Hypertension  . Atrial fibrillation  . Syncope  . Hyperthyroidism  . Hyperlipidemia  . RBBB (right bundle branch block)  . Tricuspid valve disorder  . Aortic valve disorder  . Carotid artery stenosis  . Diabetes  . Pacemaker  . OSA (obstructive sleep apnea)  . Arthritis  . Left knee DJD    PT Plan of Care PT Home Exercise Plan: see scanned report Consulted and Agree with Plan of Care: Patient  GP Functional Assessment Tool Used: LEFS Functional Limitation: Mobility: Walking and moving around Mobility: Walking and Moving Around Current Status ([id number]): At least 20 percent but less than 40 percent impaired, limited or restricted Mobility: Walking and Moving Around Goal Status [phone]): At least 1 percent but less than 20 percent impaired, limited or restricted  Valerio Pinard, PT 12/02/2012, 1:40 PM  Physician Documentation Your signature is required to indicate approval of  the treatment plan as stated above.  Please sign and either send electronically or make a copy of this report for your files and return this physician signed original.   Please mark one 1.__approve of plan  2. ___approve of plan with the following conditions.   ______________________________                                                          _____________________ Physician Signature                                                                                                             Date

## 2012-12-03 ENCOUNTER — Encounter: Payer: Self-pay | Admitting: *Deleted

## 2012-12-06 DIAGNOSIS — E119 Type 2 diabetes mellitus without complications: Secondary | ICD-10-CM | POA: Diagnosis not present

## 2012-12-06 DIAGNOSIS — I10 Essential (primary) hypertension: Secondary | ICD-10-CM | POA: Diagnosis not present

## 2012-12-07 ENCOUNTER — Ambulatory Visit (HOSPITAL_COMMUNITY)
Admission: RE | Admit: 2012-12-07 | Discharge: 2012-12-07 | Disposition: A | Discharge status: Home / Self Care | Payer: Medicare Other | Source: Ambulatory Visit | Attending: Orthopedic Surgery | Admitting: Orthopedic Surgery

## 2012-12-07 DIAGNOSIS — M6281 Muscle weakness (generalized): Secondary | ICD-10-CM | POA: Diagnosis not present

## 2012-12-07 DIAGNOSIS — I10 Essential (primary) hypertension: Secondary | ICD-10-CM | POA: Diagnosis not present

## 2012-12-07 DIAGNOSIS — M25569 Pain in unspecified knee: Secondary | ICD-10-CM | POA: Diagnosis not present

## 2012-12-07 DIAGNOSIS — E119 Type 2 diabetes mellitus without complications: Secondary | ICD-10-CM | POA: Diagnosis not present

## 2012-12-07 DIAGNOSIS — IMO0001 Reserved for inherently not codable concepts without codable children: Secondary | ICD-10-CM | POA: Diagnosis not present

## 2012-12-07 NOTE — Progress Notes (Signed)
Physical Therapy Treatment Patient Details  Name: Myrick Mcnairy MRN: [id number] Date of Birth: 1946-01-27  Today's Date: 12/07/2012 Time: 7829-5621 PT Time Calculation (min): 43 min Visit#: 2  of 8   Re-eval: 01/01/13 Charges:  therex 40' Authorization: MEDICARE  Authorization Visit#: 2  of 8    Subjective: Symptoms/Limitations Symptoms: Pt. states he went to Dr. Thurston Hole this morning and is a little sore/ 5/10 pain today after pain meds. Pain Assessment Currently in Pain?: Yes Pain Score:   5 Pain Location: Knee Pain Orientation: Left   Exercise/Treatments Stretches Active Hamstring Stretch: 3 reps;30 seconds Gastroc Stretch: 3 reps;30 seconds;Limitations Gastroc Stretch Limitations: slant board Aerobic Stationary Bike: 6' @ 4.0 seat 11 Standing Heel Raises: 15 reps;Limitations Heel Raises Limitations: toeraises 15 reps Knee Flexion: 15 reps Lateral Step Up: 15 reps;Step Height: 4";Hand Hold: 1;Left Forward Step Up: 15 reps;Step Height: 4";Left;Hand Hold: 1 Step Down: 15 reps;Step Height: 4";Left;Hand Hold: 1 Functional Squat: 15 reps Rocker Board: 2 minutes Supine Short Arc Quad Sets: 15 reps;Limitations Short Arc Quad Sets Limitations: 10" holds Straight Leg Raises: 20 reps Sidelying Hip ABduction: 15 reps Prone  Hamstring Curl: 15 reps Hip Extension: 15 reps    Physical Therapy Assessment and Plan PT Assessment and Plan Clinical Impression Statement: Added bike today instead of TM due to increase in pain.  Added new activities per PT POC without difficulty or c/o pain.  Pt. requires multimodal cues with therex to perform slower/controlled. PT Plan: TM for activity tolerance.  Progess to forward lunges, SL squat, vector stance, balance on foam, cybex.      Problem List Patient Active Problem List  Diagnosis  . Essential hypertension, benign  . PREMATURE VENTRICULAR CONTRACTIONS  . PPM-St.Jude  . PVC's (premature ventricular contractions)  . Sinoatrial  node dysfunction  . Hypertension  . Atrial fibrillation  . Syncope  . Hyperthyroidism  . Hyperlipidemia  . RBBB (right bundle branch block)  . Tricuspid valve disorder  . Aortic valve disorder  . Carotid artery stenosis  . Diabetes  . Pacemaker  . OSA (obstructive sleep apnea)  . Arthritis  . Left knee DJD    PT - End of Session Activity Tolerance: Patient tolerated treatment well General Behavior During Session: [facility] for tasks performed Cognition: [facility] for tasks performed PT Plan of Care PT Home Exercise Plan: see scanned report Consulted and Agree with Plan of Care: Patient   Lurena Nida, PTA/CLT 12/07/2012, 5:44 PM

## 2012-12-08 ENCOUNTER — Ambulatory Visit (INDEPENDENT_AMBULATORY_CARE_PROVIDER_SITE_OTHER): Payer: Medicare Other | Admitting: *Deleted

## 2012-12-08 ENCOUNTER — Encounter: Payer: Self-pay | Admitting: Internal Medicine

## 2012-12-08 DIAGNOSIS — I4891 Unspecified atrial fibrillation: Secondary | ICD-10-CM | POA: Diagnosis not present

## 2012-12-08 LAB — PACEMAKER DEVICE OBSERVATION: BAMS-0003: 60 {beats}/min

## 2012-12-08 NOTE — Progress Notes (Signed)
PPM check 

## 2012-12-08 NOTE — Patient Instructions (Addendum)
Return office visit 01/10/13 !@ 11:15am with Dr. Ladona Ridgel in [place].

## 2012-12-09 ENCOUNTER — Ambulatory Visit (HOSPITAL_COMMUNITY)
Admission: RE | Admit: 2012-12-09 | Discharge: 2012-12-09 | Disposition: A | Discharge status: Home / Self Care | Payer: Medicare Other | Source: Ambulatory Visit | Attending: Orthopedic Surgery | Admitting: Orthopedic Surgery

## 2012-12-09 DIAGNOSIS — IMO0001 Reserved for inherently not codable concepts without codable children: Secondary | ICD-10-CM | POA: Diagnosis not present

## 2012-12-09 DIAGNOSIS — M6281 Muscle weakness (generalized): Secondary | ICD-10-CM | POA: Diagnosis not present

## 2012-12-09 DIAGNOSIS — I10 Essential (primary) hypertension: Secondary | ICD-10-CM | POA: Diagnosis not present

## 2012-12-09 DIAGNOSIS — E119 Type 2 diabetes mellitus without complications: Secondary | ICD-10-CM | POA: Diagnosis not present

## 2012-12-09 DIAGNOSIS — M25569 Pain in unspecified knee: Secondary | ICD-10-CM | POA: Diagnosis not present

## 2012-12-09 NOTE — Progress Notes (Signed)
Physical Therapy Treatment Patient Details  Name: Yosiel Thieme MRN: [id number] Date of Birth: [birth year]  Today's Date: 12/09/2012 Time: 1019-1103 PT Time Calculation (min): 44 min 42' therex  Visit#: 3  of 8   Re-eval: 01/01/13    Authorization:    Authorization Time Period:    Authorization Visit#:   of     Subjective: Symptoms/Limitations Symptoms: No c/o of pain this morning only feeling of stiffness/tightness at end of his days. Pt reports he saw Dr. Wyline Mood this morning and everything was great. Patient stated he was attempting the walk to his mailbox today.Marland KitchenMarland Kitchen3/10 each way. This is one of his personal goals. 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 Bike: 6' @ 4.0 seat 11 Machines for Strengthening   Plyometrics   Standing Heel Raises: 15 reps;Limitations Heel Raises Limitations: toeraises 15 reps Knee Flexion: 15 reps Forward Lunges: 10 reps Lateral Step Up: 15 reps;Step Height: 4";Hand Hold: 1;Left Forward Step Up: 10 reps;Step Height: 8";Both Step Down: 15 reps Functional Squat: 15 reps Rocker Board: 2 minutes SLS: R for 8" out of 5 Other Standing Knee Exercises:  R toe touch tandem stance x2 30" Seated   Supine Short Arc Quad Sets: 15 reps;Limitations Short Arc Quad Sets Limitations: 3# Straight Leg Raises: 20 reps Sidelying Hip ABduction: 20 reps Hip ADduction: 20 reps Prone  Hip Extension: 20 reps      Physical Therapy Assessment and Plan PT Assessment and Plan Clinical Impression Statement: Patient performed all exercises with little corrections needed for technique; Added fwd lunges, SLS as well as R SLS and increased some reps. Continue with PT POC progressing to SL squats, vector stance, balance on foam and cybex    Goals    Problem List Patient Active Problem List  Diagnosis  .  Essential hypertension, benign  . PREMATURE VENTRICULAR CONTRACTIONS  . PPM-St.Jude  . PVC's (premature ventricular contractions)  . Sinoatrial node dysfunction  . Hypertension  . Atrial fibrillation  . Syncope  . Hyperthyroidism  . Hyperlipidemia  . RBBB (right bundle branch block)  . Tricuspid valve disorder  . Aortic valve disorder  . Carotid artery stenosis  . Diabetes  . Pacemaker  . OSA (obstructive sleep apnea)  . Arthritis  . Left knee DJD    PT - End of Session Equipment Utilized During Treatment: Gait belt Activity Tolerance: Patient tolerated treatment well General Behavior During Session: [facility] for tasks performed Cognition: [facility] for tasks performed PT Plan of Care PT Home Exercise Plan: PT POC  GP    Bethanny Toelle ATKINSO 12/09/2012, 11:15 AM

## 2012-12-14 ENCOUNTER — Ambulatory Visit (HOSPITAL_COMMUNITY)
Admission: RE | Admit: 2012-12-14 | Discharge: 2012-12-14 | Disposition: A | Discharge status: Home / Self Care | Payer: Medicare Other | Source: Ambulatory Visit | Attending: Orthopedic Surgery | Admitting: Orthopedic Surgery

## 2012-12-14 DIAGNOSIS — M6281 Muscle weakness (generalized): Secondary | ICD-10-CM | POA: Diagnosis not present

## 2012-12-14 DIAGNOSIS — E119 Type 2 diabetes mellitus without complications: Secondary | ICD-10-CM | POA: Diagnosis not present

## 2012-12-14 DIAGNOSIS — I10 Essential (primary) hypertension: Secondary | ICD-10-CM | POA: Diagnosis not present

## 2012-12-14 DIAGNOSIS — IMO0001 Reserved for inherently not codable concepts without codable children: Secondary | ICD-10-CM | POA: Diagnosis not present

## 2012-12-14 DIAGNOSIS — M25569 Pain in unspecified knee: Secondary | ICD-10-CM | POA: Diagnosis not present

## 2012-12-14 NOTE — Progress Notes (Signed)
Physical Therapy Treatment Patient Details  Name: Aaiden Depoy MRN: [id number] Date of Birth: 1946-06-16  Today's Date: 12/14/2012 Time: 1015-1057 PT Time Calculation (min): 42 min  Visit#: 4  of 8   Re-eval: 01/01/13  Charge: NMR 15', therex 27'  Authorization: Medicare  Authorization Time Period:    Authorization Visit#: 3  of 8    Subjective: Symptoms/Limitations Symptoms: Pt stated he is pain free this morning but does c/o stiffness/tightness morning time.   Pain Assessment Currently in Pain?: No/denies  Objective:   Exercise/Treatments Aerobic Stationary Bike: 6' @ 4.0 seat 11 Machines for Strengthening Cybex Knee Extension: 3 PL 15x BLE; L LE only 12x Cybex Knee Flexion: 4.5 L LE only 15x Standing Heel Raises: Limitations Heel Raises Limitations: heel/toe walking 2RT Forward Lunges: Both;10 reps Lateral Step Up: Left;15 reps;Hand Hold: 1;Step Height: 8" Forward Step Up: Left;15 reps;Hand Hold: 0;Step Height: 8" Step Down: Left;10 reps;Hand Hold: 1;Step Height: 6" Functional Squat: 15 reps Rocker Board: 2 minutes;Limitations Rocker Board Limitations: R/L SLS: L 7", R 9" max of 5 SLS with Vectors: 3x 5" Other Standing Knee Exercises: tandem stance 2x 30"; tandem gait 1 RT    Physical Therapy Assessment and Plan PT Assessment and Plan Clinical Impression Statement: Progressed therex for overall LE strengthening and to improve balance.  Pt required cueing for posture and min assistance with LOB episodes with balance activities.  VC for spatial awareness improved balance.  Added vector stance to improve hip stability and began cybex machines for quad and hamstring strengthening. PT Plan: Next session begin TM for activity tolerance and to improve gait mechanics.  Progress to SL squat and balance activities on foam.    Goals    Problem List Patient Active Problem List  Diagnosis  . Essential hypertension, benign  . PREMATURE VENTRICULAR CONTRACTIONS  .  PPM-St.Jude  . PVC's (premature ventricular contractions)  . Sinoatrial node dysfunction  . Hypertension  . Atrial fibrillation  . Syncope  . Hyperthyroidism  . Hyperlipidemia  . RBBB (right bundle branch block)  . Tricuspid valve disorder  . Aortic valve disorder  . Carotid artery stenosis  . Diabetes  . Pacemaker  . OSA (obstructive sleep apnea)  . Arthritis  . Left knee DJD    PT - End of Session Activity Tolerance: Patient tolerated treatment well General Behavior During Session: [facility] for tasks performed Cognition: [facility] for tasks performed  GP    Juel Burrow 12/14/2012, 12:21 PM

## 2012-12-16 ENCOUNTER — Ambulatory Visit (HOSPITAL_COMMUNITY)
Admission: RE | Admit: 2012-12-16 | Discharge: 2012-12-16 | Disposition: A | Discharge status: Home / Self Care | Payer: Medicare Other | Source: Ambulatory Visit | Attending: Orthopedic Surgery | Admitting: Orthopedic Surgery

## 2012-12-16 DIAGNOSIS — IMO0001 Reserved for inherently not codable concepts without codable children: Secondary | ICD-10-CM | POA: Diagnosis not present

## 2012-12-16 DIAGNOSIS — E119 Type 2 diabetes mellitus without complications: Secondary | ICD-10-CM | POA: Diagnosis not present

## 2012-12-16 DIAGNOSIS — M6281 Muscle weakness (generalized): Secondary | ICD-10-CM | POA: Diagnosis not present

## 2012-12-16 DIAGNOSIS — M25569 Pain in unspecified knee: Secondary | ICD-10-CM | POA: Diagnosis not present

## 2012-12-16 DIAGNOSIS — I10 Essential (primary) hypertension: Secondary | ICD-10-CM | POA: Diagnosis not present

## 2012-12-16 NOTE — Progress Notes (Signed)
Physical Therapy Treatment Patient Details  Name: Clarence Dawson MRN: [id number] Date of Birth: December 11, 1946  Today's Date: 12/16/2012 Time: 6295-2841 PT Time Calculation (min): 55 min  Visit#: 5  of 8   Re-eval: 01/01/13 Assessment Diagnosis: R unicompartmental replacement Surgical Date: 11/22/12 Next MD Visit: Dr. Thurston Hole - 01/04/2013 Charge: NMR 23', therex 32'  Authorization: Medicare  Authorization Time Period:    Authorization Visit#: 5  of 8    Subjective: Symptoms/Limitations Symptoms: Knee is feeling fine, pt did c/o tight calf mm and difficulty getting comfortable in bed at night. Pain Assessment Currently in Pain?: No/denies (stiff calf)  Objective:  Exercise/Treatments Stretches Gastroc Stretch: 30 seconds;Limitations;1 rep Gastroc Stretch Limitations: slant board Aerobic Stationary Bike: 8' @ 4.5 seat 11 Tread Mill: 8' @ 2.0-->2.6  Machines for Strengthening Cybex Knee Extension: 3 PL 15x BLE; L LE only 12x Cybex Knee Flexion: 4.5 L LE only 15x Standing Heel Raises: Limitations Heel Raises Limitations: heel/toe walking 2RT Lateral Step Up: Left;15 reps;Hand Hold: 1;Step Height: 8" Forward Step Up: Left;15 reps;Hand Hold: 0;Step Height: 8" Step Down: Left;10 reps;Hand Hold: 1;Step Height: 6" Functional Squat: 15 reps Rocker Board: 2 minutes;Limitations Rocker Board Limitations: R/L no HHA SLS: R10", L 9" max of 3 SLS with Vectors: 5x 5" Other Standing Knee Exercises: tandem gait 2 RT     Physical Therapy Assessment and Plan PT Assessment and Plan Clinical Impression Statement: Progressed tandem gait to dynamic surface with min assistance for LOB episodes and vc-ing for spatial awareness.  Began gait training on TM to improve gait mechanics and activity tolerance, pt able to demonstrate appropriate gait mechanics wtih min cueing for posture.  Continued with therex for LE functional strengthening exercises, no c/o increased pain but noted visible quad  fatigue following stair training and cybex machines. PT Plan: Continue with current POC,  Progress to SL squat and continue balance training on foam.  Progress to elliptical when ready for activity tolerance and increased cardio demand.    Goals    Problem List Patient Active Problem List  Diagnosis  . Essential hypertension, benign  . PREMATURE VENTRICULAR CONTRACTIONS  . PPM-St.Jude  . PVC's (premature ventricular contractions)  . Sinoatrial node dysfunction  . Hypertension  . Atrial fibrillation  . Syncope  . Hyperthyroidism  . Hyperlipidemia  . RBBB (right bundle branch block)  . Tricuspid valve disorder  . Aortic valve disorder  . Carotid artery stenosis  . Diabetes  . Pacemaker  . OSA (obstructive sleep apnea)  . Arthritis  . Left knee DJD    PT - End of Session Equipment Utilized During Treatment: Gait belt Activity Tolerance: Patient tolerated treatment well General Behavior During Session: [facility] for tasks performed Cognition: [facility] for tasks performed  GP    Juel Burrow 12/16/2012, 11:56 AM

## 2012-12-20 ENCOUNTER — Telehealth: Payer: Self-pay

## 2012-12-20 ENCOUNTER — Other Ambulatory Visit: Payer: Self-pay

## 2012-12-20 DIAGNOSIS — Z139 Encounter for screening, unspecified: Secondary | ICD-10-CM

## 2012-12-20 NOTE — Telephone Encounter (Signed)
Agree 

## 2012-12-20 NOTE — Telephone Encounter (Signed)
Called and spoke to pt's wife. She said for [place] to call Dr. Thurston Hole. So I called and spoke to Rutledge who took a messsage for Dr. Thurston Hole. She said she will call back when he responds. They will close early tomorrow and be back in office on 12/23/2012.

## 2012-12-20 NOTE — Telephone Encounter (Signed)
Called and informed pt. He will call after 02/22/2013. ( I also have him on a recall).

## 2012-12-20 NOTE — Telephone Encounter (Signed)
Dr Sherene Sires office called to cancel TCS because patient has had a knee replacement and the doctor there would like for the patient to wait to [facility] procedure until after February 22, 2013

## 2012-12-20 NOTE — Telephone Encounter (Signed)
Gastroenterology Pre-Procedure Form   Pt said he had colonoscopy in 2002 in [place] Partial left knee replacement 11/22/2012   Request Date: 12/20/2012   Requesting physician: Dr. Ouida Sills  PATIENT INFORMATION:  Clarence Dawson is a 66 y.o., male (DOB=05-29-1946).  PROCEDURE: Procedure(s) requested: colonoscopy Procedure Reason: screening for colon cancer  PATIENT REVIEW QUESTIONS: The patient reports the following:   1. Diabetes Melitis: yes  2. Joint replacements in the past 12 months: yes  ( partial left knee replacement 11/22/2012 ) 3. Major health problems in the past 3 months: no 4. Has an artificial valve or MVP:no 5. Has been advised in past to take antibiotics in advance of a procedure like teeth cleaning: no}    MEDICATIONS & ALLERGIES:    Patient reports the following regarding taking any blood thinners:   Plavix? no Aspirin?yes  Coumadin?  no  Patient confirms/reports the following medications:  Current Outpatient Prescriptions  Medication Sig Dispense Refill  . acetaminophen (TYLENOL) 500 MG tablet Take 500 mg by mouth every 6 (six) hours as needed. Pt said he takes 2 500 mg tablets  Every 6-8 hours ( averages 1 gm  Tid) and was told he could do so      . amLODipine (NORVASC) 10 MG tablet Take 10 mg by mouth daily.        Marland Kitchen aspirin 81 MG tablet Take 81 mg by mouth daily.      . cloNIDine (CATAPRES) 0.1 MG tablet Take 0.1 mg by mouth 2 (two) times daily.      Marland Kitchen ezetimibe-simvastatin (VYTORIN) 10-20 MG per tablet Take 1 tablet by mouth at bedtime.      . Fluticasone-Salmeterol (ADVAIR DISKUS) 250-50 MCG/DOSE AEPB Inhale 1 puff into the lungs every 12 (twelve) hours.        Marland Kitchen levothyroxine (SYNTHROID, LEVOTHROID) 175 MCG tablet Take 175 mcg by mouth daily.        Marland Kitchen losartan-hydrochlorothiazide (HYZAAR) 100-25 MG per tablet Take 1 tablet by mouth daily.      . meloxicam (MOBIC) 15 MG tablet Take 15 mg by mouth daily.      . metFORMIN (GLUCOPHAGE) 500 MG tablet  Take 1,000 mg by mouth 2 (two) times daily with a meal.       . metoprolol (LOPRESSOR) 100 MG tablet Take 100 mg by mouth 2 (two) times daily.       . Multiple Vitamin (MULTIVITAMIN) tablet Take 1 tablet by mouth daily.        . nateglinide (STARLIX) 120 MG tablet Take 120 mg by mouth 2 (two) times daily. Pt takes one tablet daily before lunch      . bisacodyl (DULCOLAX) 5 MG EC tablet Take 2 tablets with dinner until bowel movement.  LAXITIVE  30 tablet  0  . celecoxib (CELEBREX) 200 MG capsule Take 1 capsule (200 mg total) by mouth daily.  30 capsule  0  . docusate sodium 100 MG CAPS 1 tablet 2 times a day while on narcotics.  STOOL SOFTENER  60 capsule  0  . HYDROcodone-acetaminophen (NORCO/VICODIN) 5-325 MG per tablet Take 1 tablet by mouth every 6 (six) hours as needed.      Marland Kitchen oxyCODONE (OXY IR/ROXICODONE) 5 MG immediate release tablet 1-2 tablets every 4-6 hrs as needed for pain  100 tablet  0  . ranitidine (ZANTAC) 75 MG tablet Take 75 mg by mouth 2 (two) times daily.        Patient confirms/reports the following allergies:  Allergies  Allergen Reactions  . Food Anaphylaxis and Shortness Of Breath    Tree nuts- breathing difficulty, seafood- breathing difficulty  . Iodine Other (See Comments)    Reaction unknown  . Voltaren (Diclofenac Sodium) Other (See Comments)    Feels like things are crawling on him    Patient is appropriate to schedule for requested procedure(s): yes  AUTHORIZATION INFORMATION Primary Insurance:   ID #:   Group #:  Pre-Cert / Auth required:  Pre-Cert / Auth #:   Secondary Insurance: ID #:  Group #:  Pre-Cert / Auth required:  Pre-Cert / Auth #:   No orders of the defined types were placed in this encounter.    SCHEDULE INFORMATION: Procedure has been scheduled as follows:  Date: 01/12/2013     Time: 8:30 AM  Location: [facility] Short Stay  This Gastroenterology Pre-Precedure Form is being routed to the following provider(s) for review:  R. Roetta Sessions, MD

## 2012-12-20 NOTE — Telephone Encounter (Signed)
Clarence Dawson is aware of cancellation.

## 2012-12-20 NOTE — Telephone Encounter (Signed)
Are pain meds new since knee surgery? If not, pt will need OV 1st. Thanks

## 2012-12-20 NOTE — Telephone Encounter (Signed)
Dr. Sherene Sires phone number is [phone].

## 2012-12-20 NOTE — Telephone Encounter (Signed)
Per Clarence Dawson, pt not taking PRN narcotics. Please have pt get go ahead for screening colonoscopy from ortho. Thanks

## 2012-12-21 ENCOUNTER — Ambulatory Visit (HOSPITAL_COMMUNITY): Payer: Medicare Other | Admitting: Physical Therapy

## 2012-12-23 ENCOUNTER — Ambulatory Visit (HOSPITAL_COMMUNITY)
Admission: RE | Admit: 2012-12-23 | Discharge: 2012-12-23 | Disposition: A | Discharge status: Home / Self Care | Payer: Medicare Other | Source: Ambulatory Visit | Attending: Orthopedic Surgery | Admitting: Orthopedic Surgery

## 2012-12-23 NOTE — Progress Notes (Signed)
Physical Therapy Treatment Patient Details  Name: Clarence Dawson MRN: [id number] Date of Birth: 28-Apr-1946  Today's Date: 12/23/2012 Time: 1022-1105 PT Time Calculation (min): 43 min Visit#: 6  of 8   Re-eval: 01/01/13 Charges:  therex 32', gait 8' Authorization: Medicare  Authorization Visit#: 6  of 8    Subjective: Symptoms/Limitations Symptoms: Pt. states he is doing well; balance continues to be his biggest issue.  No pain today.   Exercise/Treatments Stretches Gastroc Stretch: 3 reps;30 seconds Gastroc Stretch Limitations: slant board Aerobic Stationary Bike: 8' @ 4.5-6.0 seat 11 Tread Mill: 8' @ 2.0-->2.6  Machines for Strengthening Cybex Knee Extension: 3 PL L LE only 2sets 15 reps Cybex Knee Flexion: 4.5 L LE only 2 sets 15x Standing Heel Raises: Limitations Heel Raises Limitations: heel/toe walking 2RT Lateral Step Up: Left;15 reps;Hand Hold: 1;Step Height: 8" Forward Step Up: Left;15 reps;Hand Hold: 0;Step Height: 8" Step Down: Left;10 reps;Hand Hold: 1;Step Height: 6" Lunge Walking - Round Trips: 1 RT SLS with Vectors: 5x 5" Rebounder: 2 Other Standing Knee Exercises: tandem gait 2 RT Other Standing Knee Exercises: balance beam 2RT fwd and side stepping     Physical Therapy Assessment and Plan PT Assessment and Plan Clinical Impression Statement: Added balance beam tandem and side stepping and progressive lunges to challenge balance and stability.  Lunges most difficult for pt. to complete.  Balance continues to be most challenging for pt. as states no difficulty with all other tasks.  Improving gait quality noted. PT Plan: Continue with current POC,  Progress to SL squat and continue balance training on foam.  Progress to elliptical  and stairwell negotiation.     Problem List Patient Active Problem List  Diagnosis  . Essential hypertension, benign  . PREMATURE VENTRICULAR CONTRACTIONS  . PPM-St.Jude  . PVC's (premature ventricular contractions)  .  Sinoatrial node dysfunction  . Hypertension  . Atrial fibrillation  . Syncope  . Hyperthyroidism  . Hyperlipidemia  . RBBB (right bundle branch block)  . Tricuspid valve disorder  . Aortic valve disorder  . Carotid artery stenosis  . Diabetes  . Pacemaker  . OSA (obstructive sleep apnea)  . Arthritis  . Left knee DJD    PT - End of Session Equipment Utilized During Treatment: Gait belt Activity Tolerance: Patient tolerated treatment well General Behavior During Session: [facility] for tasks performed Cognition: [facility] for tasks performed   Lurena Nida, PTA/CLT 12/23/2012, 12:12 PM

## 2012-12-28 ENCOUNTER — Ambulatory Visit (HOSPITAL_COMMUNITY): Payer: Medicare Other

## 2012-12-30 ENCOUNTER — Ambulatory Visit (HOSPITAL_COMMUNITY): Payer: Medicare Other | Admitting: Physical Therapy

## 2013-01-10 ENCOUNTER — Encounter: Payer: Self-pay | Admitting: Internal Medicine

## 2013-01-10 ENCOUNTER — Encounter (HOSPITAL_COMMUNITY): Payer: Self-pay | Admitting: Respiratory Therapy

## 2013-01-10 ENCOUNTER — Encounter: Payer: Medicare Other | Admitting: Internal Medicine

## 2013-01-10 ENCOUNTER — Ambulatory Visit (INDEPENDENT_AMBULATORY_CARE_PROVIDER_SITE_OTHER): Payer: Medicare Other | Admitting: Internal Medicine

## 2013-01-10 ENCOUNTER — Encounter: Payer: Self-pay | Admitting: *Deleted

## 2013-01-10 VITALS — BP 102/58 | HR 58 | Ht 70.0 in | Wt 249.1 lb

## 2013-01-10 DIAGNOSIS — I4891 Unspecified atrial fibrillation: Secondary | ICD-10-CM

## 2013-01-10 DIAGNOSIS — I495 Sick sinus syndrome: Secondary | ICD-10-CM

## 2013-01-10 DIAGNOSIS — Z01812 Encounter for preprocedural laboratory examination: Secondary | ICD-10-CM

## 2013-01-10 DIAGNOSIS — I10 Essential (primary) hypertension: Secondary | ICD-10-CM

## 2013-01-10 DIAGNOSIS — Z950 Presence of cardiac pacemaker: Secondary | ICD-10-CM

## 2013-01-10 DIAGNOSIS — Z7901 Long term (current) use of anticoagulants: Secondary | ICD-10-CM

## 2013-01-10 LAB — CBC
MCH: 30.8 pg (ref 26.0–34.0)
MCV: 89.9 fL (ref 78.0–100.0)
Platelets: 187 10*3/uL (ref 150–400)
RDW: 13.7 % (ref 11.5–15.5)
WBC: 5.8 10*3/uL (ref 4.0–10.5)

## 2013-01-10 LAB — BASIC METABOLIC PANEL
Calcium: 10.2 mg/dL (ref 8.4–10.5)
Creat: 1.13 mg/dL (ref 0.50–1.35)

## 2013-01-10 LAB — PACEMAKER DEVICE OBSERVATION
AL THRESHOLD: 0.75 V
BAMS-0003: 60 {beats}/min
BATTERY VOLTAGE: 2.62 V
RV LEAD THRESHOLD: 1 V

## 2013-01-10 LAB — APTT: aPTT: 28 seconds (ref 24–37)

## 2013-01-10 NOTE — Assessment & Plan Note (Signed)
With his history of diabetes and hypertension, the patient will need to be reconsidered for anticoagulation therapy. We'll consider initiating this after his pacemaker has been placed.

## 2013-01-10 NOTE — Progress Notes (Signed)
HPI Clarence Dawson returns today for followup. He is a very pleasant middle-age man with symptomatic bradycardia, hypertension, status post permanent pacemaker insertion, and diabetes. In the interim he has done well. He underwent knee replacement surgery without difficulty. He has reached elective replacement on his pacemaker. His blood pressure is very well controlled, almost too low. Allergies  Allergen Reactions  . Food Anaphylaxis and Shortness Of Breath    Tree nuts- breathing difficulty, seafood- breathing difficulty  . Iodine Other (See Comments)    Reaction unknown  . Voltaren (Diclofenac Sodium) Other (See Comments)    Feels like things are crawling on him     Current Outpatient Prescriptions  Medication Sig Dispense Refill  . acetaminophen (TYLENOL) 500 MG tablet Take 500 mg by mouth every 6 (six) hours as needed. Pt said he takes 2 500 mg tablets  Every 6-8 hours ( averages 1 gm  Tid) and was told he could do so      . amLODipine (NORVASC) 10 MG tablet Take 10 mg by mouth daily.        Marland Kitchen aspirin 81 MG tablet Take 81 mg by mouth daily.      . bisacodyl (DULCOLAX) 5 MG EC tablet Take 2 tablets with dinner until bowel movement.  LAXITIVE  30 tablet  0  . cloNIDine (CATAPRES) 0.1 MG tablet Take 0.1 mg by mouth 2 (two) times daily.      Marland Kitchen ezetimibe-simvastatin (VYTORIN) 10-20 MG per tablet Take 1 tablet by mouth at bedtime.      . Fluticasone-Salmeterol (ADVAIR DISKUS) 250-50 MCG/DOSE AEPB Inhale 1 puff into the lungs every 12 (twelve) hours.        Marland Kitchen levothyroxine (SYNTHROID, LEVOTHROID) 175 MCG tablet Take 175 mcg by mouth daily.        Marland Kitchen losartan-hydrochlorothiazide (HYZAAR) 100-25 MG per tablet Take 1 tablet by mouth daily.      . meloxicam (MOBIC) 15 MG tablet Take 15 mg by mouth daily.      . metFORMIN (GLUCOPHAGE) 500 MG tablet Take 1,000 mg by mouth 2 (two) times daily with a meal.       . metoprolol (LOPRESSOR) 100 MG tablet Take 100 mg by mouth 2 (two) times daily.       .  Multiple Vitamin (MULTIVITAMIN) tablet Take 1 tablet by mouth daily.        . nateglinide (STARLIX) 120 MG tablet Take 120 mg by mouth 2 (two) times daily. Pt takes one tablet daily before lunch         Past Medical History  Diagnosis Date  . Hypertension   . Allergic rhinitis   . Atrial fibrillation   . Syncope   . Sinoatrial node dysfunction   . Hyperthyroidism   . 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 Hyperlipidemia   . RBBB (right bundle branch block)     Coronary Artherosclerosis  . Tricuspid valve disorder   . Aortic valve disorder   . Carotid artery stenosis   . Diabetes   . Hemorrhage of rectum   . Pacemaker     Oct 2005 in [place].  . Asthma     since childhood- seasonal allergies induced  . OSA (obstructive sleep apnea)     done in [place] 2005- place no longer in business  . Arthritis     ostoearthritis  . Sinoatrial node dysfunction   . Hypothyroidism   . GERD (gastroesophageal reflux disease)   . H/O hiatal hernia     ROS:  All systems reviewed and negative except as noted in the HPI.   Past Surgical History  Procedure Date  . Gallbladder surgery 12/01/2006  . Effusion of joint 01/20/2007    Left Elbow- pt denies  . Rotator cuff surgery 2001    Right shoulder  . Carpal tunnel release 1994    right wrist  . Arthropathy 2005    Rebuilding of left thumb and joint   . Hemorroidectomy 2003  . Circumcision   . A-v cardiac pacemaker insertion     Sick sinus syndrome DDR pacer  . Cardiac electrophysiology study and ablation 09/2008    for pvcs, Dr. Vesta Mixer  . Left knee arthroscopy     [facility]  . Carpal tunnel release 05/04/2012    Procedure: CARPAL TUNNEL RELEASE;  Surgeon: Nicki Reaper, MD;  Location: [facility];  Service: Orthopedics;  Laterality: Left;  . Cardiac catheterization   . Eye surgery     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-[facility]  . Eye surgery 2012    Left eye Corneal transplant- partial- Cataract  . Tonsillectomy    . Cholecystectomy 1994  . Partial knee arthroplasty 11/22/2012    Procedure: UNICOMPARTMENTAL KNEE;  Surgeon: Nilda Simmer, MD;  Location: [facility] OR;  Service: Orthopedics;  Laterality: Left;  left unicompartmental knee arthroplasty     Family History  Problem Relation Age of Onset  . Other Father 5    Sudden Cardiac death  . Pancreatic cancer Mother      History   Social History  . Marital Status: Married    Spouse Name: N/A    Number of Children: N/A  . Years of Education: N/A   Occupational History  . Not on file.   Social History Main Topics  . Smoking status: Former Smoker -- 1.0 packs/day for 25 years    Quit date: 02/12/1984  . Smokeless tobacco: Not on file  . Alcohol Use: 0.6 oz/week    1 Cans of beer per week     Comment: 1 can of beer or glasse of wine daily  . Drug Use: No  . Sexually Active: Not on file   Other Topics Concern  . Not on file   Social History Narrative   Regular exercise: No     BP 102/58  Pulse 58  Ht 5\' 10"  (1.778 m)  Wt 249 lb 1.4 oz (112.986 kg)  BMI 35.74 kg/m2  SpO2 95%  Physical Exam:  Well appearing middle-aged man, NAD HEENT: Unremarkable Neck:  No JVD, no thyromegally Lungs:  Clear with no wheezes, rales, or rhonchi. HEART:  Regular rate rhythm, no murmurs, no rubs, no clicks Abd:  soft, positive bowel sounds, no organomegally, no rebound, no guarding Ext:  2 plus pulses, no edema, no cyanosis, no clubbing Skin:  No rashes no nodules Neuro:  CN II through XII intact, motor grossly intact  DEVICE  Normal device function.  See PaceArt for details. Device at elective replacement  Assess/Plan:

## 2013-01-10 NOTE — Assessment & Plan Note (Signed)
His blood pressure is actually 2 well controlled. I've asked him to reduce his dose of clonidine to 0.1 mg tablet daily. He will continue his other blood pressure medications so we would consider reducing them as well as time goes on.

## 2013-01-10 NOTE — Assessment & Plan Note (Signed)
His pacemaker has reached elective replacement. We will schedule pacemaker generator change in the next several days.

## 2013-01-11 DIAGNOSIS — I10 Essential (primary) hypertension: Secondary | ICD-10-CM | POA: Diagnosis not present

## 2013-01-11 DIAGNOSIS — Z45018 Encounter for adjustment and management of other part of cardiac pacemaker: Secondary | ICD-10-CM | POA: Diagnosis not present

## 2013-01-11 DIAGNOSIS — I498 Other specified cardiac arrhythmias: Secondary | ICD-10-CM | POA: Diagnosis not present

## 2013-01-11 DIAGNOSIS — K219 Gastro-esophageal reflux disease without esophagitis: Secondary | ICD-10-CM | POA: Diagnosis not present

## 2013-01-11 DIAGNOSIS — E119 Type 2 diabetes mellitus without complications: Secondary | ICD-10-CM | POA: Diagnosis not present

## 2013-01-11 DIAGNOSIS — G4733 Obstructive sleep apnea (adult) (pediatric): Secondary | ICD-10-CM | POA: Diagnosis not present

## 2013-01-11 DIAGNOSIS — Z79899 Other long term (current) drug therapy: Secondary | ICD-10-CM | POA: Diagnosis not present

## 2013-01-11 DIAGNOSIS — Z7902 Long term (current) use of antithrombotics/antiplatelets: Secondary | ICD-10-CM | POA: Diagnosis not present

## 2013-01-11 MED ORDER — CEFAZOLIN SODIUM-DEXTROSE 2-3 GM-% IV SOLR
2.0000 g | INTRAVENOUS | Status: DC
  Filled 2013-01-11: qty 50

## 2013-01-11 MED ORDER — SODIUM CHLORIDE 0.9 % IR SOLN
80.0000 mg | Status: DC
  Filled 2013-01-11: qty 2

## 2013-01-12 ENCOUNTER — Ambulatory Visit (HOSPITAL_COMMUNITY): Admission: RE | Admit: 2013-01-12 | Payer: Medicare Other | Source: Ambulatory Visit | Admitting: Internal Medicine

## 2013-01-12 ENCOUNTER — Encounter (HOSPITAL_COMMUNITY)
Admission: RE | Disposition: A | Discharge status: Home / Self Care | Payer: Self-pay | Source: Ambulatory Visit | Attending: Internal Medicine

## 2013-01-12 ENCOUNTER — Ambulatory Visit (HOSPITAL_COMMUNITY)
Admission: RE | Admit: 2013-01-12 | Discharge: 2013-01-12 | Disposition: A | Discharge status: Home / Self Care | Payer: Medicare Other | Source: Ambulatory Visit | Attending: Internal Medicine | Admitting: Internal Medicine

## 2013-01-12 ENCOUNTER — Encounter (HOSPITAL_COMMUNITY): Admission: RE | Payer: Self-pay | Source: Ambulatory Visit

## 2013-01-12 DIAGNOSIS — G4733 Obstructive sleep apnea (adult) (pediatric): Secondary | ICD-10-CM | POA: Insufficient documentation

## 2013-01-12 DIAGNOSIS — Z45018 Encounter for adjustment and management of other part of cardiac pacemaker: Secondary | ICD-10-CM | POA: Insufficient documentation

## 2013-01-12 DIAGNOSIS — E119 Type 2 diabetes mellitus without complications: Secondary | ICD-10-CM | POA: Insufficient documentation

## 2013-01-12 DIAGNOSIS — Z7902 Long term (current) use of antithrombotics/antiplatelets: Secondary | ICD-10-CM | POA: Insufficient documentation

## 2013-01-12 DIAGNOSIS — I498 Other specified cardiac arrhythmias: Secondary | ICD-10-CM | POA: Diagnosis not present

## 2013-01-12 DIAGNOSIS — K219 Gastro-esophageal reflux disease without esophagitis: Secondary | ICD-10-CM | POA: Insufficient documentation

## 2013-01-12 DIAGNOSIS — Z79899 Other long term (current) drug therapy: Secondary | ICD-10-CM | POA: Insufficient documentation

## 2013-01-12 DIAGNOSIS — I10 Essential (primary) hypertension: Secondary | ICD-10-CM | POA: Insufficient documentation

## 2013-01-12 HISTORY — PX: PERMANENT PACEMAKER GENERATOR CHANGE: SHX6022

## 2013-01-12 LAB — GLUCOSE, CAPILLARY: Glucose-Capillary: 147 mg/dL — ABNORMAL HIGH (ref 70–99)

## 2013-01-12 SURGERY — COLONOSCOPY
Anesthesia: Moderate Sedation

## 2013-01-12 SURGERY — PERMANENT PACEMAKER GENERATOR CHANGE
Anesthesia: LOCAL

## 2013-01-12 MED ORDER — FENTANYL CITRATE 0.05 MG/ML IJ SOLN
INTRAMUSCULAR | Status: AC
  Filled 2013-01-12: qty 2

## 2013-01-12 MED ORDER — MUPIROCIN 2 % EX OINT
TOPICAL_OINTMENT | CUTANEOUS | Status: AC
  Administered 2013-01-12: 1 via NASAL
  Filled 2013-01-12: qty 22

## 2013-01-12 MED ORDER — CHLORHEXIDINE GLUCONATE 4 % EX LIQD
60.0000 mL | Freq: Once | CUTANEOUS | Status: DC
  Filled 2013-01-12: qty 60

## 2013-01-12 MED ORDER — SODIUM CHLORIDE 0.45 % IV SOLN
INTRAVENOUS | Status: DC
  Administered 2013-01-12: 09:00:00 via INTRAVENOUS

## 2013-01-12 MED ORDER — LIDOCAINE HCL (PF) 1 % IJ SOLN
INTRAMUSCULAR | Status: AC
  Filled 2013-01-12: qty 60

## 2013-01-12 MED ORDER — MUPIROCIN 2 % EX OINT
TOPICAL_OINTMENT | Freq: Two times a day (BID) | CUTANEOUS | Status: DC
  Filled 2013-01-12: qty 22

## 2013-01-12 MED ORDER — MIDAZOLAM HCL 2 MG/2ML IJ SOLN
INTRAMUSCULAR | Status: AC
  Filled 2013-01-12: qty 2

## 2013-01-12 MED ORDER — CEFAZOLIN SODIUM-DEXTROSE 2-3 GM-% IV SOLR
INTRAVENOUS | Status: AC
  Filled 2013-01-12: qty 50

## 2013-01-12 NOTE — H&P (View-Only) (Signed)
HPI [name] returns today for followup. He is a very pleasant middle-age man with symptomatic bradycardia, hypertension, status post permanent pacemaker insertion, and diabetes. In the interim he has done well. He underwent knee replacement surgery without difficulty. He has reached elective replacement on his pacemaker. His blood pressure is very well controlled, almost too low. Allergies  Allergen Reactions  . Food Anaphylaxis and Shortness Of Breath    Tree nuts- breathing difficulty, seafood- breathing difficulty  . Iodine Other (See Comments)    Reaction unknown  . Voltaren (Diclofenac Sodium) Other (See Comments)    Feels like things are crawling on him     Current Outpatient Prescriptions  Medication Sig Dispense Refill  . acetaminophen (TYLENOL) 500 MG tablet Take 500 mg by mouth every 6 (six) hours as needed. Pt said he takes 2 500 mg tablets  Every 6-8 hours ( averages 1 gm  Tid) and was told he could do so      . amLODipine (NORVASC) 10 MG tablet Take 10 mg by mouth daily.        . aspirin 81 MG tablet Take 81 mg by mouth daily.      . bisacodyl (DULCOLAX) 5 MG EC tablet Take 2 tablets with dinner until bowel movement.  LAXITIVE  30 tablet  0  . cloNIDine (CATAPRES) 0.1 MG tablet Take 0.1 mg by mouth 2 (two) times daily.      . ezetimibe-simvastatin (VYTORIN) 10-20 MG per tablet Take 1 tablet by mouth at bedtime.      . Fluticasone-Salmeterol (ADVAIR DISKUS) 250-50 MCG/DOSE AEPB Inhale 1 puff into the lungs every 12 (twelve) hours.        . levothyroxine (SYNTHROID, LEVOTHROID) 175 MCG tablet Take 175 mcg by mouth daily.        . losartan-hydrochlorothiazide (HYZAAR) 100-25 MG per tablet Take 1 tablet by mouth daily.      . meloxicam (MOBIC) 15 MG tablet Take 15 mg by mouth daily.      . metFORMIN (GLUCOPHAGE) 500 MG tablet Take 1,000 mg by mouth 2 (two) times daily with a meal.       . metoprolol (LOPRESSOR) 100 MG tablet Take 100 mg by mouth 2 (two) times daily.       .  Multiple Vitamin (MULTIVITAMIN) tablet Take 1 tablet by mouth daily.        . nateglinide (STARLIX) 120 MG tablet Take 120 mg by mouth 2 (two) times daily. Pt takes one tablet daily before lunch         Past Medical History  Diagnosis Date  . Hypertension   . Allergic rhinitis   . Atrial fibrillation   . Syncope   . Sinoatrial node dysfunction   . Hyperthyroidism   . 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 Hyperlipidemia   . RBBB (right bundle branch block)     Coronary Artherosclerosis  . Tricuspid valve disorder   . Aortic valve disorder   . Carotid artery stenosis   . Diabetes   . Hemorrhage of rectum   . Pacemaker     Oct 2005 in [place].  . Asthma     since childhood- seasonal allergies induced  . OSA (obstructive sleep apnea)     done in [place] 2005- place no longer in business  . Arthritis     ostoearthritis  . Sinoatrial node dysfunction   . Hypothyroidism   . GERD (gastroesophageal reflux disease)   . H/O hiatal hernia     ROS:     All systems reviewed and negative except as noted in the HPI.   Past Surgical History  Procedure Date  . Gallbladder surgery 12/01/2006  . Effusion of joint 01/20/2007    Left Elbow- pt denies  . Rotator cuff surgery 2001    Right shoulder  . Carpal tunnel release 1994    right wrist  . Arthropathy 2005    Rebuilding of left thumb and joint   . Hemorroidectomy 2003  . Circumcision   . A-v cardiac pacemaker insertion     Sick sinus syndrome DDR pacer  . Cardiac electrophysiology study and ablation 09/2008    for pvcs, Dr. Dandamudi  . Left knee arthroscopy     [facility]- [facility]  . Carpal tunnel release 05/04/2012    Procedure: CARPAL TUNNEL RELEASE;  Surgeon: Gary R Kuzma, MD;  Location: [facility];  Service: Orthopedics;  Laterality: Left;  . Cardiac catheterization   . 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 Eye surgery 2012    Left eye Corneal transplant- partial- Cataract  . Tonsillectomy    . Cholecystectomy 1994  . Partial knee arthroplasty 11/22/2012    Procedure: UNICOMPARTMENTAL KNEE;  Surgeon: Robert A Wainer, MD;  Location: [facility] OR;  Service: Orthopedics;  Laterality: Left;  left unicompartmental knee arthroplasty     Family History  Problem Relation Age of Onset  . Other Father 60    Sudden Cardiac death  . Pancreatic cancer Mother      History   Social History  . Marital Status: Married    Spouse Name: N/A    Number of Children: N/A  . Years of Education: N/A   Occupational History  . Not on file.   Social History Main Topics  . Smoking status: Former Smoker -- 1.0 packs/day for 25 years    Quit date: 02/12/1984  . Smokeless tobacco: Not on file  . Alcohol Use: 0.6 oz/week    1 Cans of beer per week     Comment: 1 can of beer or glasse of wine daily  . Drug Use: No  . Sexually Active: Not on file   Other Topics Concern  . Not on file   Social History Narrative   Regular exercise: No     BP 102/58  Pulse 58  Ht 5' 10" (1.778 m)  Wt 249 lb 1.4 oz (112.986 kg)  BMI 35.74 kg/m2  SpO2 95%  Physical Exam:  Well appearing middle-aged man, NAD HEENT: Unremarkable Neck:  No JVD, no thyromegally Lungs:  Clear with no wheezes, rales, or rhonchi. HEART:  Regular rate rhythm, no murmurs, no rubs, no clicks Abd:  soft, positive bowel sounds, no organomegally, no rebound, no guarding Ext:  2 plus pulses, no edema, no cyanosis, no clubbing Skin:  No rashes no nodules Neuro:  CN II through XII intact, motor grossly intact  DEVICE  Normal device function.  See PaceArt for details. Device at elective replacement  Assess/Plan:  

## 2013-01-12 NOTE — Op Note (Signed)
DDD PPM generator removal, insertio of a new device with PPM pocket revision without immediate complication. D# [id number]

## 2013-01-12 NOTE — Op Note (Signed)
NAMERAMAL, ECKHARDT NO.:  [id number]  MEDICAL RECORD NO.:  [id number]  LOCATION:  [facility]                         FACILITY:  [facility]  PHYSICIAN:  Doylene Canning. Ladona Ridgel, MD    DATE OF BIRTH:  [birth year]  DATE OF PROCEDURE:  01/12/2013 DATE OF DISCHARGE:  01/12/2013                              OPERATIVE REPORT   PROCEDURE PERFORMED:  Removal of previously implanted dual-chamber pacemaker, which had reached elective replacement and insertion of a new dual-chamber pacemaker with pacemaker pocket revision.  INTRODUCTION:  The patient is a very pleasant 67 year old male with a history of symptomatic bradycardia status post pacemaker insertion.  He had reached elective replacement on his current device.  His leads were working satisfactorily.  He is now referred for removal of his old device and insertion of a new one.  PROCEDURE:  After informed consent was obtained, the patient was taken to the diagnostic EP lab in a fasting state.  After usual preparation and draping, intravenous fentanyl and midazolam was given for sedation. Lidocaine 30 mL was infiltrated into the left infraclavicular region over the old pacemaker insertion site.  A 5-cm incision was carried out over this region.  Electrocautery was utilized to dissect down to the pacemaker pocket and with gentle traction, the generator was removed without difficulty.  The leads were evaluated.  They were found to be working satisfactorily.  The new Engineering geologist DR RF dual-chamber pacemaker was connected to the old atrial and ventricular leads and placed back in the subcutaneous pocket.  The pocket was irrigated with antibiotic irrigation and the incision was closed with 2-0 and 3-0 Vicryl.  The thresholds were a volt at 0.4 milliseconds or less.  The P- waves were 3. The R-waves were 5.5.  The impedance 340 in the atrium and 580 in the ventricle.  With these satisfactory parameters, the pocket was again irrigated  and the incision closed with 2-0 and 3-0 Vicryl. Benzoin and Steri-Strips were painted on skin.  A pressure dressing applied and the patient was returned to his room in satisfactory condition.  COMPLICATIONS:  There were no immediate procedure complications.  RESULTS:  Demonstrate successful implantation of a new St. Jude dual- chamber pacemaker with removal of the old device with pacemaker pocket revision without immediate procedure complication.     Doylene Canning. Ladona Ridgel, MD     GWT/MEDQ  D:  01/12/2013  T:  01/12/2013  Job:  [id number]

## 2013-01-12 NOTE — Interval H&P Note (Signed)
History and Physical Interval Note:  01/12/2013 9:07 AM  Clarence Dawson  has presented today for surgery, with the diagnosis of a  The various methods of treatment have been discussed with the patient and family. After consideration of risks, benefits and other options for treatment, the patient has consented to  Procedure(s) (LRB) with comments: PERMANENT PACEMAKER GENERATOR CHANGE (N/A) as a surgical intervention .  The patient's history has been reviewed, patient examined, no change in status, stable for surgery.  I have reviewed the patient's chart and labs.  Questions were answered to the patient's satisfaction.     Leonia Reeves.D.

## 2013-01-19 ENCOUNTER — Ambulatory Visit: Payer: Medicare Other

## 2013-01-19 ENCOUNTER — Ambulatory Visit (INDEPENDENT_AMBULATORY_CARE_PROVIDER_SITE_OTHER): Payer: Medicare Other | Admitting: *Deleted

## 2013-01-19 DIAGNOSIS — I495 Sick sinus syndrome: Secondary | ICD-10-CM

## 2013-01-19 DIAGNOSIS — I4891 Unspecified atrial fibrillation: Secondary | ICD-10-CM

## 2013-01-19 LAB — PACEMAKER DEVICE OBSERVATION
AL AMPLITUDE: 2.3 mv
AL IMPEDENCE PM: 362.5 Ohm
BATTERY VOLTAGE: 3.0381 V
RV LEAD IMPEDENCE PM: 637.5 Ohm
VENTRICULAR PACING PM: 1

## 2013-01-19 NOTE — Progress Notes (Signed)
Wound check-PPM 

## 2013-02-03 ENCOUNTER — Encounter: Payer: Self-pay | Admitting: Internal Medicine

## 2013-02-21 ENCOUNTER — Other Ambulatory Visit: Payer: Self-pay

## 2013-02-21 ENCOUNTER — Telehealth: Payer: Self-pay

## 2013-02-21 DIAGNOSIS — Z1211 Encounter for screening for malignant neoplasm of colon: Secondary | ICD-10-CM

## 2013-02-21 MED ORDER — PEG-KCL-NACL-NASULF-NA ASC-C 100 G PO SOLR: 1.0000 | ORAL | Status: DC

## 2013-02-21 NOTE — Telephone Encounter (Signed)
Rx sent to [facility]. Instructions mailed to pt.

## 2013-02-21 NOTE — Telephone Encounter (Signed)
OK to proceed with colonoscopy Per ASGE guidelines, no need for antibiotics. Take half of your diabetes medications (GLUCOPHAGE & STARLIX)  the day prior to the procedure Early morning appointment-hold diabetes medications day of procedure Bring all your medications and/or any insulin to the hospital the day of the procedure. Follow blood sugars, call us or your PCP if any problems.  Doris, be sure pt has pacemaker only & not defibrillator. Thanks

## 2013-02-21 NOTE — Telephone Encounter (Signed)
Clarence Dawson, I called the patient and confirmed he only has PACEMAKER. Does NOT have a defibrillator.

## 2013-02-21 NOTE — Telephone Encounter (Signed)
noted 

## 2013-02-21 NOTE — Telephone Encounter (Signed)
Gastroenterology Pre-Procedure Form   Pt had knee replacement in Nov, 2013.  Dr. Thurston Hole had asked him to wait til after 02/22/2013 to schedule colonoscopy  Pt had pace maker replaced 01/12/2013 due to worn battery    Request Date: 02/21/2013    Requesting Physician: Dr. Ouida Sills     PATIENT INFORMATION:  Clarence Dawson is a 67 y.o., male (DOB=11-26-1946).  PROCEDURE: Procedure(s) requested: colonoscopy Procedure Reason: screening for colon cancer  PATIENT REVIEW QUESTIONS: The patient reports the following:   1. Diabetes Melitis: Yes 2. Joint replacements in the past 12 months: Partial left knee replacement in 10/2013 3. Major health problems in the past 3 months: no 4. Has an artificial valve or MVP:no 5. Has been advised in past to take antibiotics in advance of a procedure like teeth cleaning: no}    MEDICATIONS & ALLERGIES:    Patient reports the following regarding taking any blood thinners:   Plavix? no Aspirin? Yes Coumadin?  no  Patient confirms/reports the following medications:  Current Outpatient Prescriptions  Medication Sig Dispense Refill  . acetaminophen (TYLENOL) 500 MG tablet Take 500 mg by mouth every 6 (six) hours as needed. Pt said he takes 2 500 mg tablets  Every 6-8 hours ( averages 1 gm  Tid) and was told he could do so      . amLODipine (NORVASC) 10 MG tablet Take 10 mg by mouth daily.        Marland Kitchen aspirin 81 MG tablet Take 81 mg by mouth daily.      . calcium carbonate (TUMS - DOSED IN MG ELEMENTAL CALCIUM) 500 MG chewable tablet Chew 1 tablet by mouth daily. As needed only      . cloNIDine (CATAPRES) 0.1 MG tablet Take 0.1 mg by mouth 2 (two) times daily.      Marland Kitchen ezetimibe-simvastatin (VYTORIN) 10-20 MG per tablet Take 1 tablet by mouth at bedtime.      . Fluticasone-Salmeterol (ADVAIR DISKUS) 250-50 MCG/DOSE AEPB Inhale 1 puff into the lungs every 12 (twelve) hours.        Marland Kitchen levothyroxine (SYNTHROID, LEVOTHROID) 175 MCG tablet Take 175 mcg by mouth daily.         Marland Kitchen losartan-hydrochlorothiazide (HYZAAR) 100-25 MG per tablet Take 1 tablet by mouth daily.      . meloxicam (MOBIC) 15 MG tablet Take 15 mg by mouth daily.      . metFORMIN (GLUCOPHAGE) 500 MG tablet Take 1,000 mg by mouth 2 (two) times daily with a meal.       . metoprolol (LOPRESSOR) 100 MG tablet Take 100 mg by mouth 2 (two) times daily.       . Multiple Vitamin (MULTIVITAMIN) tablet Take 1 tablet by mouth daily.        . nateglinide (STARLIX) 120 MG tablet Take 120 mg by mouth 2 (two) times daily. Pt takes one tablet daily before lunch      . bisacodyl (DULCOLAX) 5 MG EC tablet Take 2 tablets with dinner until bowel movement.  LAXITIVE  30 tablet  0   No current facility-administered medications for this visit.    Patient confirms/reports the following allergies:  Allergies  Allergen Reactions  . Food Anaphylaxis and Shortness Of Breath    Tree nuts- breathing difficulty, seafood- breathing difficulty  . Iodine Other (See Comments)    Reaction unknown  . Voltaren (Diclofenac Sodium) Other (See Comments)    Feels like things are crawling on him    Patient is appropriate to  schedule for requested procedure(s): yes  AUTHORIZATION INFORMATION Primary Insurance:   ID #:  Group #:  Pre-Cert / Auth required: Pre-Cert / Auth #:   Secondary Insurance:   ID #:   Group #:  Pre-Cert / Auth required: Pre-Cert / Auth #:   No orders of the defined types were placed in this encounter.    SCHEDULE INFORMATION: Procedure has been scheduled as follows:  Date: 03/14/2013    Time: 8:15 AM  Location: [facility] Short Stay  This Gastroenterology Pre-Precedure Form is being routed to the following provider(s) for review: R. Roetta Sessions, MD

## 2013-02-22 ENCOUNTER — Telehealth: Payer: Self-pay

## 2013-02-22 NOTE — Telephone Encounter (Signed)
I called [employer] at [phone] and spoke to Gume A , who said no PA is required for a screening colonoscopy. Reference # [id number].

## 2013-02-25 ENCOUNTER — Encounter (HOSPITAL_COMMUNITY): Payer: Self-pay | Admitting: Pharmacy Technician

## 2013-03-14 ENCOUNTER — Encounter (HOSPITAL_COMMUNITY)
Admission: RE | Disposition: A | Discharge status: Home / Self Care | Payer: Self-pay | Source: Ambulatory Visit | Attending: Internal Medicine

## 2013-03-14 ENCOUNTER — Ambulatory Visit (HOSPITAL_COMMUNITY)
Admission: RE | Admit: 2013-03-14 | Discharge: 2013-03-14 | Disposition: A | Discharge status: Home / Self Care | Payer: Medicare Other | Source: Ambulatory Visit | Attending: Internal Medicine | Admitting: Internal Medicine

## 2013-03-14 ENCOUNTER — Encounter (HOSPITAL_COMMUNITY): Payer: Self-pay | Admitting: *Deleted

## 2013-03-14 DIAGNOSIS — E119 Type 2 diabetes mellitus without complications: Secondary | ICD-10-CM | POA: Diagnosis not present

## 2013-03-14 DIAGNOSIS — K5730 Diverticulosis of large intestine without perforation or abscess without bleeding: Secondary | ICD-10-CM | POA: Insufficient documentation

## 2013-03-14 DIAGNOSIS — I10 Essential (primary) hypertension: Secondary | ICD-10-CM | POA: Insufficient documentation

## 2013-03-14 DIAGNOSIS — Z1211 Encounter for screening for malignant neoplasm of colon: Secondary | ICD-10-CM | POA: Diagnosis not present

## 2013-03-14 HISTORY — PX: COLONOSCOPY: SHX5424

## 2013-03-14 SURGERY — COLONOSCOPY
Anesthesia: Moderate Sedation

## 2013-03-14 MED ORDER — MEPERIDINE HCL 100 MG/ML IJ SOLN
INTRAMUSCULAR | Status: AC
  Filled 2013-03-14: qty 2

## 2013-03-14 MED ORDER — STERILE WATER FOR IRRIGATION IR SOLN
Status: DC | PRN
  Administered 2013-03-14: 09:00:00

## 2013-03-14 MED ORDER — MIDAZOLAM HCL 5 MG/5ML IJ SOLN
INTRAMUSCULAR | Status: AC
  Filled 2013-03-14: qty 10

## 2013-03-14 MED ORDER — ONDANSETRON HCL 4 MG/2ML IJ SOLN
INTRAMUSCULAR | Status: DC | PRN
  Administered 2013-03-14: 4 mg via INTRAVENOUS

## 2013-03-14 MED ORDER — MIDAZOLAM HCL 5 MG/5ML IJ SOLN
INTRAMUSCULAR | Status: DC | PRN
  Administered 2013-03-14 (×2): 2 mg via INTRAVENOUS
  Administered 2013-03-14: 1 mg via INTRAVENOUS

## 2013-03-14 MED ORDER — ONDANSETRON HCL 4 MG/2ML IJ SOLN
INTRAMUSCULAR | Status: AC
  Filled 2013-03-14: qty 2

## 2013-03-14 MED ORDER — MEPERIDINE HCL 100 MG/ML IJ SOLN
INTRAMUSCULAR | Status: DC | PRN
  Administered 2013-03-14: 25 mg via INTRAVENOUS
  Administered 2013-03-14: 50 mg via INTRAVENOUS
  Administered 2013-03-14: 25 mg via INTRAVENOUS

## 2013-03-14 MED ORDER — SODIUM CHLORIDE 0.45 % IV SOLN: INTRAVENOUS | Status: DC

## 2013-03-14 MED ORDER — SODIUM CHLORIDE 0.9 % IV SOLN
INTRAVENOUS | Status: DC
  Administered 2013-03-14: 08:00:00 via INTRAVENOUS

## 2013-03-14 NOTE — H&P (Signed)
Primary Care Physician:  Carylon Perches, MD Primary Gastroenterologist:  Dr. Jena Gauss  Pre-Procedure History & Physical: HPI:  Clarence Dawson is a 67 y.o. male is here for a screening colonoscopy. Average risk examination (mother with colonic polyps but at an advanced age in her 44s).  No bowel symptoms currently. Last colonoscopy 11 years ago for hematochezia (bleeding hemorrhoids found-[place] ).  Past Medical History  Diagnosis Date  . Hypertension   . Allergic rhinitis   . Atrial fibrillation   . Syncope   . Sinoatrial node dysfunction   . 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 Hyperlipidemia   . RBBB (right bundle branch block)     Coronary Artherosclerosis  . Tricuspid valve disorder   . Aortic valve disorder   . Carotid artery stenosis   . Diabetes   . Hemorrhage of rectum   . Pacemaker     Oct 2005 in [place].  . Asthma     since childhood- seasonal allergies induced  . OSA (obstructive sleep apnea)     done in [place] 2005- place no longer in business  . Arthritis     ostoearthritis  . Sinoatrial node dysfunction   . Hypothyroidism   . GERD (gastroesophageal reflux disease)   . H/O hiatal hernia     Past Surgical History  Procedure Laterality Date  . Gallbladder surgery  12/01/2006  . Rotator cuff surgery  2001    Right shoulder  . Carpal tunnel release  1994    right wrist  . Arthropathy  2005    Rebuilding of left thumb and joint   . Hemorroidectomy  2003  . Circumcision    . A-v cardiac pacemaker insertion      Sick sinus syndrome DDR pacer  . Cardiac electrophysiology study and ablation  09/2008    for pvcs, Dr. Vesta Mixer  . Left knee arthroscopy      [facility]- [facility]  . Carpal tunnel release  05/04/2012    Procedure: CARPAL TUNNEL RELEASE;  Surgeon: Nicki Reaper, MD;  Location: [facility];  Service: Orthopedics;  Laterality: Left;  . Cardiac catheterization    . Eye surgery      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  . Eye surgery   2012    Left eye Corneal transplant- partial- Cataract  . Tonsillectomy    . Cholecystectomy  1994  . Partial knee arthroplasty  11/22/2012    Procedure: UNICOMPARTMENTAL KNEE;  Surgeon: Nilda Simmer, MD;  Location: [facility] OR;  Service: Orthopedics;  Laterality: Left;  left unicompartmental knee arthroplasty  . Hand tendon surgery Left late 1990's    thumb    Prior to Admission medications   Medication Sig Start Date End Date Taking? Authorizing Provider  acetaminophen (TYLENOL) 500 MG tablet Take 500 mg by mouth every 6 (six) hours as needed. Pt said he takes 2 500 mg tablets  Every 6-8 hours ( averages 1 gm  Tid) and was told he could do so   Yes Historical Provider, MD  amLODipine (NORVASC) 10 MG tablet Take 10 mg by mouth daily.     Yes Historical Provider, MD  aspirin 81 MG tablet Take 81 mg by mouth daily.   Yes Historical Provider, MD  cloNIDine (CATAPRES) 0.1 MG tablet Take 0.1 mg by mouth 2 (two) times daily.   Yes Historical Provider, MD  ezetimibe-simvastatin (VYTORIN) 10-20 MG per tablet Take 1 tablet by mouth at bedtime.   Yes Historical Provider, MD  Fluticasone-Salmeterol (ADVAIR  DISKUS) 250-50 MCG/DOSE AEPB Inhale 1 puff into the lungs every 12 (twelve) hours.     Yes Historical Provider, MD  levothyroxine (SYNTHROID, LEVOTHROID) 175 MCG tablet Take 175 mcg by mouth daily.     Yes Historical Provider, MD  losartan-hydrochlorothiazide (HYZAAR) 100-25 MG per tablet Take 1 tablet by mouth daily.   Yes Historical Provider, MD  meloxicam (MOBIC) 15 MG tablet Take 15 mg by mouth daily.   Yes Historical Provider, MD  metFORMIN (GLUCOPHAGE) 500 MG tablet Take 1,000 mg by mouth 2 (two) times daily with a meal.    Yes Historical Provider, MD  metoprolol (LOPRESSOR) 100 MG tablet Take 100 mg by mouth 2 (two) times daily.    Yes Historical Provider, MD  Multiple Vitamin (MULTIVITAMIN) tablet Take 1 tablet by mouth daily.     Yes Historical Provider, MD  peg 3350 powder (MOVIPREP) 100 G  SOLR Take 1 kit (100 g total) by mouth as directed. 02/21/13  Yes Corbin Ade, MD  bisacodyl (DULCOLAX) 5 MG EC tablet Take 5 mg by mouth daily as needed for constipation. 11/23/12   Kirstin J Shepperson, PA-C  calcium carbonate (TUMS - DOSED IN MG ELEMENTAL CALCIUM) 500 MG chewable tablet Chew 1 tablet by mouth daily as needed for heartburn. As needed only    Historical Provider, MD  nateglinide (STARLIX) 120 MG tablet Take 120 mg by mouth 2 (two) times daily. Pt takes one tablet daily before lunch    Historical Provider, MD    Allergies as of 02/21/2013 - Review Complete 02/21/2013  Allergen Reaction Noted  . Food Anaphylaxis and Shortness Of Breath 04/30/2012  . Iodine Other (See Comments) 01/22/2011  . Voltaren (diclofenac sodium) Other (See Comments) 04/30/2012    Family History  Problem Relation Age of Onset  . Other Father 87    Sudden Cardiac death  . Pancreatic cancer Mother   . Colon cancer Mother   . Colon polyps Neg Hx   . Colon cancer Maternal Aunt     History   Social History  . Marital Status: Married    Spouse Name: N/A    Number of Children: N/A  . Years of Education: N/A   Occupational History  . Not on file.   Social History Main Topics  . Smoking status: Former Smoker -- 1.00 packs/day for 25 years    Quit date: 02/12/1984  . Smokeless tobacco: Not on file  . Alcohol Use: 1.2 oz/week    1 Cans of beer, 1 Glasses of wine per week     Comment: 1 can of beer or glasse of wine daily  . Drug Use: No  . Sexually Active: Not on file   Other Topics Concern  . Not on file   Social History Narrative   Regular exercise: No    Review of Systems: See HPI, otherwise negative ROS  Physical Exam: BP 176/78  Temp(Src) 97.6 F (36.4 C) (Oral)  Resp 16  Ht 5\' 11"  (1.803 m)  Wt 240 lb (108.863 kg)  BMI 33.49 kg/m2  SpO2 94% General:   Alert,  Well-developed, well-nourished, pleasant and cooperative in NAD Head:  Normocephalic and atraumatic. Eyes:   Sclera clear, no icterus.   Conjunctiva pink. Ears:  Normal auditory acuity. Nose:  No deformity, discharge,  or lesions. Mouth:  No deformity or lesions, dentition normal. Neck:  Supple; no masses or thyromegaly. Lungs:  Clear throughout to auscultation.   No wheezes, crackles, or rhonchi. No acute distress. Dawson:  Regular rate and rhythm; no murmurs, clicks, rubs,  or gallops. Abdomen:  Nondistended. Positive bowel sounds soft and nontender without appreciable mass organomegaly Msk:  Symmetrical without gross deformities. Normal posture. Pulses:  Normal pulses noted. Extremities:  Without clubbing or edema. Neurologic:  Alert and  oriented x4;  grossly normal neurologically. Skin:  Intact without significant lesions or rashes. Cervical Nodes:  No significant cervical adenopathy. Psych:  Alert and cooperative. Normal mood and affect.  Impression/Plan: GERON MULFORD is now here to undergo a screening colonoscopy.  Average risk screening examination  Risks, benefits, limitations, imponderables and alternatives regarding colonoscopy have been reviewed with the patient. Questions have been answered. All parties agreeable.

## 2013-03-14 NOTE — Op Note (Signed)
[facility] [address] [place], [address]   COLONOSCOPY PROCEDURE REPORT  PATIENT: Clarence Dawson, Clarence Dawson  MR#:         [id number] BIRTHDATE: 1946-02-27 , 67  yrs. old GENDER: Male ENDOSCOPIST: R.  Roetta Sessions, MD FACP FACG REFERRED BY:  Carylon Perches, M.D. PROCEDURE DATE:  03/14/2013 PROCEDURE:     Screening  ileocolonoscopy INDICATIONS: Average risk screening examination  INFORMED CONSENT:  The risks, benefits, alternatives and imponderables including but not limited to bleeding, perforation as well as the possibility of a missed lesion have been reviewed.  The potential for biopsy, lesion removal, etc. have also been discussed.  Questions have been answered.  All parties agreeable. Please see the history and physical in the medical record for more information.  MEDICATIONS: Versed 5 mg IV and Demerol 100 mg IV in divided doses. Zofran 4 mg  DESCRIPTION OF PROCEDURE:  After a digital rectal exam was performed, the EC-3890LI ([id number])  colonoscope was advanced from the anus through the rectum and colon to the area of the cecum, ileocecal valve and appendiceal orifice.  The cecum was deeply intubated.  These structures were well-seen and photographed for the record.  From the level of the cecum and ileocecal valve, the scope was slowly and cautiously withdrawn.  The mucosal surfaces were carefully surveyed utilizing scope tip deflection to facilitate fold flattening as needed.  The scope was pulled down into the rectum where a thorough examination including retroflexion was performed.    FINDINGS:  Adequate preparation. Normal rectum. Few scattered left-sided diverticula; the remainder of the colonic mucosa appeared normal. The distal 10 cm of terminal ileual mucosa also appeared normal.  THERAPEUTIC / DIAGNOSTIC MANEUVERS PERFORMED:  None  COMPLICATIONS: None  CECAL WITHDRAWAL TIME:  9 minutes  IMPRESSION:  Colonic diverticulosis  RECOMMENDATIONS:  Repeat colonoscopy for screening purposes in 10 years   _______________________________ eSigned:  R. Roetta Sessions, MD FACP [facility] 03/14/2013 9:23 AM   CC:    PATIENT NAME:  Clarence Dawson, Clarence Dawson MR#: [id number]

## 2013-03-16 ENCOUNTER — Encounter (HOSPITAL_COMMUNITY): Payer: Self-pay | Admitting: Internal Medicine

## 2013-03-29 DIAGNOSIS — Z471 Aftercare following joint replacement surgery: Secondary | ICD-10-CM | POA: Diagnosis not present

## 2013-03-29 DIAGNOSIS — Z96659 Presence of unspecified artificial knee joint: Secondary | ICD-10-CM | POA: Diagnosis not present

## 2013-04-05 DIAGNOSIS — E785 Hyperlipidemia, unspecified: Secondary | ICD-10-CM | POA: Diagnosis not present

## 2013-04-05 DIAGNOSIS — Z79899 Other long term (current) drug therapy: Secondary | ICD-10-CM | POA: Diagnosis not present

## 2013-04-05 DIAGNOSIS — I10 Essential (primary) hypertension: Secondary | ICD-10-CM | POA: Diagnosis not present

## 2013-04-05 DIAGNOSIS — E119 Type 2 diabetes mellitus without complications: Secondary | ICD-10-CM | POA: Diagnosis not present

## 2013-04-12 DIAGNOSIS — E039 Hypothyroidism, unspecified: Secondary | ICD-10-CM | POA: Diagnosis not present

## 2013-04-12 DIAGNOSIS — I10 Essential (primary) hypertension: Secondary | ICD-10-CM | POA: Diagnosis not present

## 2013-04-12 DIAGNOSIS — E119 Type 2 diabetes mellitus without complications: Secondary | ICD-10-CM | POA: Diagnosis not present

## 2013-04-25 ENCOUNTER — Ambulatory Visit (INDEPENDENT_AMBULATORY_CARE_PROVIDER_SITE_OTHER): Payer: Medicare Other | Admitting: Internal Medicine

## 2013-04-25 ENCOUNTER — Encounter: Payer: Self-pay | Admitting: Internal Medicine

## 2013-04-25 VITALS — BP 138/73 | HR 60 | Wt 253.0 lb

## 2013-04-25 DIAGNOSIS — I10 Essential (primary) hypertension: Secondary | ICD-10-CM

## 2013-04-25 DIAGNOSIS — Z950 Presence of cardiac pacemaker: Secondary | ICD-10-CM | POA: Diagnosis not present

## 2013-04-25 DIAGNOSIS — I495 Sick sinus syndrome: Secondary | ICD-10-CM

## 2013-04-25 LAB — PACEMAKER DEVICE OBSERVATION
AL AMPLITUDE: 2.8 mv
AL IMPEDENCE PM: 362.5 Ohm
AL THRESHOLD: 0.75 V
BAMS-0001: 170 {beats}/min
BATTERY VOLTAGE: 3.023 V
RV LEAD AMPLITUDE: 6.1 mv

## 2013-04-25 NOTE — Progress Notes (Signed)
HPI [name] returns today for followup. He is a pleasant 67 yo man with a h/o symptomatic bradycardia due to sinus node dysfunction, s/p PPM. He underwent generator change 3 months ago. He has done well in the interim. No chest pain or sob. No syncope. He admits to sodium indiscretion. He has had mild peripheral edema.  Allergies  Allergen Reactions  . Food Anaphylaxis and Shortness Of Breath    Tree nuts- breathing difficulty, seafood- breathing difficulty  . Iodine Itching and Other (See Comments)    Reaction unknown  . Voltaren (Diclofenac Sodium) Other (See Comments)    Feels like things are crawling on him     Current Outpatient Prescriptions  Medication Sig Dispense Refill  . acetaminophen (TYLENOL) 500 MG tablet Take 500 mg by mouth every 6 (six) hours as needed. Pt said he takes 2 500 mg tablets  Every 6-8 hours ( averages 1 gm  Tid) and was told he could do so      . amLODipine (NORVASC) 10 MG tablet Take 10 mg by mouth daily.        Marland Kitchen aspirin 81 MG tablet Take 81 mg by mouth daily.      Marland Kitchen atorvastatin (LIPITOR) 40 MG tablet Take 40 mg by mouth daily.      . calcium carbonate (TUMS - DOSED IN MG ELEMENTAL CALCIUM) 500 MG chewable tablet Chew 1 tablet by mouth daily as needed for heartburn. As needed only      . cloNIDine (CATAPRES) 0.1 MG tablet Take 0.1 mg by mouth 2 (two) times daily.      . Fluticasone-Salmeterol (ADVAIR DISKUS) 250-50 MCG/DOSE AEPB Inhale 1 puff into the lungs every 12 (twelve) hours.        Marland Kitchen levothyroxine (SYNTHROID, LEVOTHROID) 175 MCG tablet Take 175 mcg by mouth daily.        Marland Kitchen losartan-hydrochlorothiazide (HYZAAR) 100-25 MG per tablet Take 1 tablet by mouth daily.      . meloxicam (MOBIC) 15 MG tablet Take 15 mg by mouth daily.      . metFORMIN (GLUCOPHAGE) 500 MG tablet Take 1,000 mg by mouth 2 (two) times daily with a meal.       . metoprolol (LOPRESSOR) 100 MG tablet Take 100 mg by mouth 2 (two) times daily.       . Multiple Vitamin (MULTIVITAMIN)  tablet Take 1 tablet by mouth daily.        . nateglinide (STARLIX) 120 MG tablet Pt takes one tablet daily before lunch       No current facility-administered medications for this visit.     Past Medical History  Diagnosis Date  . Hypertension   . Allergic rhinitis   . Atrial fibrillation   . Syncope   . Sinoatrial node dysfunction   . 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 Hyperlipidemia   . RBBB (right bundle branch block)     Coronary Artherosclerosis  . Tricuspid valve disorder   . Aortic valve disorder   . Carotid artery stenosis   . Diabetes   . Hemorrhage of rectum   . Pacemaker     Oct 2005 in [place].  . Asthma     since childhood- seasonal allergies induced  . OSA (obstructive sleep apnea)     done in [place] 2005- place no longer in business  . Arthritis     ostoearthritis  . Sinoatrial node dysfunction   . Hypothyroidism   . GERD (gastroesophageal reflux disease)   . H/O hiatal  hernia     ROS:   All systems reviewed and negative except as noted in the HPI.   Past Surgical History  Procedure Laterality Date  . Gallbladder surgery  12/01/2006  . Rotator cuff surgery  2001    Right shoulder  . Carpal tunnel release  1994    right wrist  . Arthropathy  2005    Rebuilding of left thumb and joint   . Hemorroidectomy  2003  . Circumcision    . A-v cardiac pacemaker insertion      Sick sinus syndrome DDR pacer  . Cardiac electrophysiology study and ablation  09/2008    for pvcs, Dr. Vesta Mixer  . Left knee arthroscopy      [facility] 21 2011- [facility]  . Carpal tunnel release  05/04/2012    Procedure: CARPAL TUNNEL RELEASE;  Surgeon: Nicki Reaper, MD;  Location:  [facility];  Service: Orthopedics;  Laterality: Left;  . Cardiac catheterization    . Eye surgery      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  . Eye surgery  2012    Left eye Corneal transplant- partial- Cataract  . Tonsillectomy    . Cholecystectomy  1994  . Partial knee arthroplasty   11/22/2012    Procedure: UNICOMPARTMENTAL KNEE;  Surgeon: Nilda Simmer, MD;  Location: [facility] OR;  Service: Orthopedics;  Laterality: Left;  left unicompartmental knee arthroplasty  . Hand tendon surgery Left late 1990's    thumb  . Colonoscopy N/A 03/14/2013    Procedure: COLONOSCOPY;  Surgeon: Corbin Ade, MD;  [facility];  Service: Endoscopy;  Laterality: N/A;  8:15 AM     Family History  Problem Relation Age of Onset  . Other Father 16    Sudden Cardiac death  . Pancreatic cancer Mother   . Colon cancer Mother   . Colon polyps Neg Hx   . Colon cancer Maternal Aunt      History   Social History  . Marital Status: Married    Spouse Name: N/A    Number of Children: N/A  . Years of Education: N/A   Occupational History  . Not on file.   Social History Main Topics  . Smoking status: Former Smoker -- 1.00 packs/day for 25 years    Quit date: 02/12/1984  . Smokeless tobacco: Not on file  . Alcohol Use: 1.2 oz/week    1 Cans of beer, 1 Glasses of wine per week     Comment: 1 can of beer or glasse of wine daily  . Drug Use: No  . Sexually Active: Not on file   Other Topics Concern  . Not on file   Social History Narrative   Regular exercise: No     BP 138/73  Pulse 60  Wt 253 lb (114.76 kg)  BMI 35.3 kg/m2  Physical Exam:  Well appearing 67 yo man, NAD HEENT: Unremarkable Neck:  7 cm JVD, no thyromegally Lungs:  Clear with no wheezes, well healed PPM incision. HEART:  Regular rate rhythm, no murmurs, no rubs, no clicks Abd:  soft, positive bowel sounds, no organomegally, no rebound, no guarding Ext:  2 plus pulses, no edema, no cyanosis, no clubbing Skin:  No rashes no nodules Neuro:  CN II through XII intact, motor grossly intact  DEVICE  Normal device function.  See PaceArt for details.   Assess/Plan:

## 2013-04-25 NOTE — Assessment & Plan Note (Signed)
His [employer] DDD PPM is working normally. Will recheck in several months. 

## 2013-04-25 NOTE — Patient Instructions (Addendum)
Your physician wants you to follow-up in: January 2015 with Dr Ladona Ridgel.  You will receive a reminder letter in the mail two months in advance. If you don't receive a letter, please call our office to schedule the follow-up appointment.  Your physician recommends that you continue on your current medications as directed. Please refer to the Current Medication list given to you today.

## 2013-04-25 NOTE — Assessment & Plan Note (Signed)
His blood pressure is fairly well controlled. I have asked him to stop eating too much sodium.

## 2013-04-28 DIAGNOSIS — D235 Other benign neoplasm of skin of trunk: Secondary | ICD-10-CM | POA: Diagnosis not present

## 2013-04-28 DIAGNOSIS — C44611 Basal cell carcinoma of skin of unspecified upper limb, including shoulder: Secondary | ICD-10-CM | POA: Diagnosis not present

## 2013-04-28 DIAGNOSIS — L570 Actinic keratosis: Secondary | ICD-10-CM | POA: Diagnosis not present

## 2013-05-14 ENCOUNTER — Encounter (HOSPITAL_COMMUNITY): Payer: Self-pay

## 2013-05-14 DIAGNOSIS — Z8709 Personal history of other diseases of the respiratory system: Secondary | ICD-10-CM | POA: Diagnosis not present

## 2013-05-14 DIAGNOSIS — Z7982 Long term (current) use of aspirin: Secondary | ICD-10-CM | POA: Insufficient documentation

## 2013-05-14 DIAGNOSIS — E119 Type 2 diabetes mellitus without complications: Secondary | ICD-10-CM | POA: Insufficient documentation

## 2013-05-14 DIAGNOSIS — I10 Essential (primary) hypertension: Secondary | ICD-10-CM | POA: Diagnosis not present

## 2013-05-14 DIAGNOSIS — J189 Pneumonia, unspecified organism: Secondary | ICD-10-CM | POA: Diagnosis not present

## 2013-05-14 DIAGNOSIS — J159 Unspecified bacterial pneumonia: Secondary | ICD-10-CM | POA: Insufficient documentation

## 2013-05-14 DIAGNOSIS — IMO0002 Reserved for concepts with insufficient information to code with codable children: Secondary | ICD-10-CM | POA: Diagnosis not present

## 2013-05-14 DIAGNOSIS — Z8719 Personal history of other diseases of the digestive system: Secondary | ICD-10-CM | POA: Diagnosis not present

## 2013-05-14 DIAGNOSIS — J45909 Unspecified asthma, uncomplicated: Secondary | ICD-10-CM | POA: Insufficient documentation

## 2013-05-14 DIAGNOSIS — E039 Hypothyroidism, unspecified: Secondary | ICD-10-CM | POA: Insufficient documentation

## 2013-05-14 DIAGNOSIS — K219 Gastro-esophageal reflux disease without esophagitis: Secondary | ICD-10-CM | POA: Diagnosis not present

## 2013-05-14 DIAGNOSIS — Z950 Presence of cardiac pacemaker: Secondary | ICD-10-CM | POA: Insufficient documentation

## 2013-05-14 DIAGNOSIS — Z888 Allergy status to other drugs, medicaments and biological substances status: Secondary | ICD-10-CM | POA: Insufficient documentation

## 2013-05-14 DIAGNOSIS — Z79899 Other long term (current) drug therapy: Secondary | ICD-10-CM | POA: Insufficient documentation

## 2013-05-14 DIAGNOSIS — Z87891 Personal history of nicotine dependence: Secondary | ICD-10-CM | POA: Diagnosis not present

## 2013-05-14 DIAGNOSIS — Z8679 Personal history of other diseases of the circulatory system: Secondary | ICD-10-CM | POA: Insufficient documentation

## 2013-05-14 DIAGNOSIS — IMO0001 Reserved for inherently not codable concepts without codable children: Secondary | ICD-10-CM | POA: Insufficient documentation

## 2013-05-14 DIAGNOSIS — R61 Generalized hyperhidrosis: Secondary | ICD-10-CM | POA: Diagnosis not present

## 2013-05-14 DIAGNOSIS — R509 Fever, unspecified: Secondary | ICD-10-CM | POA: Diagnosis not present

## 2013-05-14 DIAGNOSIS — G4733 Obstructive sleep apnea (adult) (pediatric): Secondary | ICD-10-CM | POA: Insufficient documentation

## 2013-05-14 NOTE — ED Notes (Signed)
For the past 2 days I have been running a fever, can't stop shaking, was feeling nauseated and then I vomited on the way into the ER per pt.

## 2013-05-15 ENCOUNTER — Emergency Department (HOSPITAL_COMMUNITY)
Admission: EM | Admit: 2013-05-15 | Discharge: 2013-05-15 | Disposition: A | Discharge status: Home / Self Care | Payer: Medicare Other | Attending: Emergency Medicine | Admitting: Emergency Medicine

## 2013-05-15 ENCOUNTER — Emergency Department (HOSPITAL_COMMUNITY): Payer: Medicare Other

## 2013-05-15 DIAGNOSIS — J189 Pneumonia, unspecified organism: Secondary | ICD-10-CM

## 2013-05-15 DIAGNOSIS — R509 Fever, unspecified: Secondary | ICD-10-CM | POA: Diagnosis not present

## 2013-05-15 DIAGNOSIS — R61 Generalized hyperhidrosis: Secondary | ICD-10-CM | POA: Diagnosis not present

## 2013-05-15 LAB — CBC WITH DIFFERENTIAL/PLATELET
Basophils Relative: 0 % (ref 0–1)
Eosinophils Absolute: 0 10*3/uL (ref 0.0–0.7)
HCT: 40.9 % (ref 39.0–52.0)
Hemoglobin: 14.2 g/dL (ref 13.0–17.0)
MCH: 30.2 pg (ref 26.0–34.0)
MCHC: 34.7 g/dL (ref 30.0–36.0)
Monocytes Absolute: 1.1 10*3/uL — ABNORMAL HIGH (ref 0.1–1.0)
Monocytes Relative: 9 % (ref 3–12)

## 2013-05-15 LAB — BASIC METABOLIC PANEL
BUN: 18 mg/dL (ref 6–23)
Calcium: 10.8 mg/dL — ABNORMAL HIGH (ref 8.4–10.5)
Creatinine, Ser: 1.17 mg/dL (ref 0.50–1.35)
GFR calc Af Amer: 73 mL/min — ABNORMAL LOW (ref >90)
GFR calc non Af Amer: 63 mL/min — ABNORMAL LOW (ref >90)

## 2013-05-15 MED ORDER — ONDANSETRON HCL 4 MG/2ML IJ SOLN
4.0000 mg | Freq: Once | INTRAMUSCULAR | Status: AC
  Administered 2013-05-15: 4 mg via INTRAVENOUS
  Filled 2013-05-15: qty 2

## 2013-05-15 MED ORDER — LEVOFLOXACIN 500 MG PO TABS: 500.0000 mg | ORAL_TABLET | Freq: Every day | ORAL | Status: DC

## 2013-05-15 MED ORDER — KETOROLAC TROMETHAMINE 30 MG/ML IJ SOLN
30.0000 mg | Freq: Once | INTRAMUSCULAR | Status: AC
  Administered 2013-05-15: 30 mg via INTRAVENOUS
  Filled 2013-05-15: qty 1

## 2013-05-15 MED ORDER — LEVOFLOXACIN 500 MG PO TABS
500.0000 mg | ORAL_TABLET | Freq: Once | ORAL | Status: AC
  Administered 2013-05-15: 500 mg via ORAL
  Filled 2013-05-15: qty 1

## 2013-05-15 MED ORDER — HYDROMORPHONE HCL PF 1 MG/ML IJ SOLN
1.0000 mg | Freq: Once | INTRAMUSCULAR | Status: AC
  Administered 2013-05-15: 1 mg via INTRAVENOUS
  Filled 2013-05-15: qty 1

## 2013-05-15 NOTE — ED Provider Notes (Signed)
History     CSN: [id number]  Arrival date & time 05/14/13  2247   First MD Initiated Contact with Patient 05/15/13 0019      Chief Complaint  Patient presents with  . Emesis  . Fever    (Consider location/radiation/quality/duration/timing/severity/associated sxs/prior treatment) HPI HPI Comments: Clarence Dawson is a 67 y.o. male who presents to the Emergency Department complaining of fever and body aches for 2 days. Fever to 104 at home. Body aches and pains. Has taken tylenol without  relief. Denies shortness of breath, chest pain, cough.   PCP Dr.Fagan Past Medical History  Diagnosis Date  . Hypertension   . Allergic rhinitis   . Atrial fibrillation   . Syncope   . Sinoatrial node dysfunction   . 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 Hyperlipidemia   . RBBB (right bundle branch block)     Coronary Artherosclerosis  . Tricuspid valve disorder   . Aortic valve disorder   . Carotid artery stenosis   . Diabetes   . Hemorrhage of rectum   . Pacemaker     Oct 2005 in [place].  . Asthma     since childhood- seasonal allergies induced  . OSA (obstructive sleep apnea)     done in [place] 2005- place no longer in business  . Arthritis     ostoearthritis  . Sinoatrial node dysfunction   . Hypothyroidism   . GERD (gastroesophageal reflux disease)   . H/O hiatal hernia     Past Surgical History  Procedure Laterality Date  . Gallbladder surgery  12/01/2006  . Rotator cuff surgery  2001    Right shoulder  . Carpal tunnel release  1994    right wrist  . Arthropathy  2005    Rebuilding of left thumb and joint   . Hemorroidectomy  2003  . Circumcision    . A-v cardiac pacemaker insertion      Sick sinus syndrome DDR pacer  . Cardiac electrophysiology study and ablation  09/2008    for pvcs, Dr. Vesta Mixer  . Left knee arthroscopy      [facility]  . Carpal tunnel release  05/04/2012    Procedure: CARPAL TUNNEL RELEASE;  Surgeon: Nicki Reaper, MD;  Location: [facility];  Service: Orthopedics;  Laterality: Left;  . Cardiac catheterization    . 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 Eye surgery  2012    Left eye Corneal transplant- partial- Cataract  . Tonsillectomy    . Cholecystectomy  1994  . Partial knee arthroplasty  11/22/2012    Procedure: UNICOMPARTMENTAL KNEE;  Surgeon: Nilda Simmer, MD;  Location: [facility] OR;  Service: Orthopedics;  Laterality: Left;  left unicompartmental knee arthroplasty  . Hand tendon surgery Left late 1990's    thumb  . Colonoscopy N/A 03/14/2013    Procedure: COLONOSCOPY;  Surgeon: Corbin Ade, MD;  [facility];  Service: Endoscopy;  Laterality: N/A;  8:15 AM    Family History  Problem Relation Age of Onset  . Other Father 66    Sudden Cardiac death  . Pancreatic cancer Mother   . Colon cancer Mother   . Colon polyps Neg Hx   . Colon cancer Maternal Aunt     History  Substance Use Topics  . Smoking status: Former Smoker -- 1.00 packs/day for 25 years    Quit date: 02/12/1984  . Smokeless tobacco: Not on  file  . Alcohol Use: 1.2 oz/week    1 Cans of beer, 1 Glasses of wine per week     Comment: 1 can of beer or glasse of wine daily      Review of Systems  Constitutional: Positive for fever.       10 Systems reviewed and are negative for acute change except as noted in the HPI.  HENT: Negative for congestion.   Eyes: Negative for discharge and redness.  Respiratory: Negative for cough and shortness of breath.   Cardiovascular: Negative for chest pain.  Gastrointestinal: Negative for vomiting and abdominal pain.  Musculoskeletal: Positive for myalgias. Negative for back pain.  Skin: Negative for rash.  Neurological: Negative for syncope, numbness and headaches.  Psychiatric/Behavioral:       No behavior change.    Allergies  Food; Iodine; and Voltaren  Home Medications   Current Outpatient Rx  Name  Route  Sig  Dispense  Refill  .  acetaminophen (TYLENOL) 500 MG tablet   Oral   Take 500 mg by mouth every 6 (six) hours as needed. Pt said he takes 2 500 mg tablets  Every 6-8 hours ( averages 1 gm  Tid) and was told he could do so         . amLODipine (NORVASC) 10 MG tablet   Oral   Take 10 mg by mouth daily.           Marland Kitchen aspirin 81 MG tablet   Oral   Take 81 mg by mouth daily.         Marland Kitchen atorvastatin (LIPITOR) 40 MG tablet   Oral   Take 40 mg by mouth daily.         . calcium carbonate (TUMS - DOSED IN MG ELEMENTAL CALCIUM) 500 MG chewable tablet   Oral   Chew 1 tablet by mouth daily as needed for heartburn. As needed only         . cloNIDine (CATAPRES) 0.1 MG tablet   Oral   Take 0.1 mg by mouth 2 (two) times daily.         . Fluticasone-Salmeterol (ADVAIR DISKUS) 250-50 MCG/DOSE AEPB   Inhalation   Inhale 1 puff into the lungs every 12 (twelve) hours.           Marland Kitchen levothyroxine (SYNTHROID, LEVOTHROID) 175 MCG tablet   Oral   Take 175 mcg by mouth daily.           Marland Kitchen losartan-hydrochlorothiazide (HYZAAR) 100-25 MG per tablet   Oral   Take 1 tablet by mouth daily.         . meloxicam (MOBIC) 15 MG tablet   Oral   Take 15 mg by mouth daily.         . metFORMIN (GLUCOPHAGE) 500 MG tablet   Oral   Take 1,000 mg by mouth 2 (two) times daily with a meal.          . metoprolol (LOPRESSOR) 100 MG tablet   Oral   Take 100 mg by mouth 2 (two) times daily.          . Multiple Vitamin (MULTIVITAMIN) tablet   Oral   Take 1 tablet by mouth daily.           . nateglinide (STARLIX) 120 MG tablet      Pt takes one tablet daily before lunch           BP 160/80  Pulse 90  Temp(Src) 101.3  F (38.5 C) (Oral)  Resp 16  SpO2 90%  Physical Exam  Nursing note and vitals reviewed. Constitutional: He appears well-developed and well-nourished.  Awake, alert, nontoxic appearance.  HENT:  Head: Normocephalic and atraumatic.  Eyes: EOM are normal. Pupils are equal, round, and  reactive to light.  Neck: Normal range of motion. Neck supple.  Cardiovascular: Normal rate and intact distal pulses.   Pulmonary/Chest: Effort normal and breath sounds normal. He exhibits no tenderness.  Abdominal: Soft. Bowel sounds are normal. There is no tenderness. There is no rebound.  Musculoskeletal: He exhibits no tenderness.  Baseline ROM, no obvious new focal weakness.  Neurological:  Mental status and motor strength appears baseline for patient and situation.  Skin: No rash noted.  Psychiatric: He has a normal mood and affect.    ED Course  Procedures (including critical care time) Results for orders placed during the hospital encounter of 05/15/13  CBC WITH DIFFERENTIAL      Result Value Range   WBC 12.6 (*) 4.0 - 10.5 K/uL   RBC 4.70  4.22 - 5.81 MIL/uL   Hemoglobin 14.2  13.0 - 17.0 g/dL   HCT 29.5  62.1 - 30.8 %   MCV 87.0  78.0 - 100.0 fL   MCH 30.2  26.0 - 34.0 pg   MCHC 34.7  30.0 - 36.0 g/dL   RDW 65.7  84.6 - 96.2 %   Platelets 156  150 - 400 K/uL   Neutrophils Relative % 83 (*) 43 - 77 %   Neutro Abs 10.4 (*) 1.7 - 7.7 K/uL   Lymphocytes Relative 8 (*) 12 - 46 %   Lymphs Abs 1.0  0.7 - 4.0 K/uL   Monocytes Relative 9  3 - 12 %   Monocytes Absolute 1.1 (*) 0.1 - 1.0 K/uL   Eosinophils Relative 0  0 - 5 %   Eosinophils Absolute 0.0  0.0 - 0.7 K/uL   Basophils Relative 0  0 - 1 %   Basophils Absolute 0.0  0.0 - 0.1 K/uL  BASIC METABOLIC PANEL      Result Value Range   Sodium 132 (*) 135 - 145 mEq/L   Potassium 3.7  3.5 - 5.1 mEq/L   Chloride 93 (*) 96 - 112 mEq/L   CO2 26  19 - 32 mEq/L   Glucose, Bld 188 (*) 70 - 99 mg/dL   BUN 18  6 - 23 mg/dL   Creatinine, Ser 9.52  0.50 - 1.35 mg/dL   Calcium 84.1 (*) 8.4 - 10.5 mg/dL   GFR calc non Af Amer 63 (*) >90 mL/min   GFR calc Af Amer 73 (*) >90 mL/min    Dg Chest Portable 1 View  05/15/2013   *RADIOLOGY REPORT*  Clinical Data: Fever.  Emesis.  Diaphoresis.  Headache.  PORTABLE CHEST - 1 VIEW   Comparison: 11/15/2012  Findings: Dual lead pacer remains in place.  Abnormal airspace opacity in the left upper lobe is observed.  Mild chronic interstitial accentuation.  Prominent main pulmonary artery.  Thoracic spondylosis noted.  IMPRESSION:  1.  Airspace opacity, left upper lobe, suspicious for pneumonia. 2.  Prominent main pulmonary artery, query pulmonary arterial hypertension.   Original Report Authenticated By: Gaylyn Rong, M.D.   Medications  levofloxacin (LEVAQUIN) tablet 500 mg (not administered)  HYDROmorphone (DILAUDID) injection 1 mg (1 mg Intravenous Given 05/15/13 0049)  ondansetron (ZOFRAN) injection 4 mg (4 mg Intravenous Given 05/15/13 0050)  ketorolac (TORADOL) 30 MG/ML injection 30  mg (30 mg Intravenous Given 05/15/13 0050)       MDM  Patient with fever and myalgias for two days. Chest xray with a pneumonia in the LUL. Given levaquin. Reviewed results with patient and his wife. Pt stable in ED with no significant deterioration in condition.The patient appears reasonably screened and/or stabilized for discharge and I doubt any other medical condition or other [facility] requiring further screening, evaluation, or treatment in the ED at this time prior to discharge.  MDM Reviewed: nursing note and vitals Interpretation: x-ray and labs           [facility]. Colon Branch, MD 05/15/13 [phone]

## 2013-05-20 DIAGNOSIS — Z961 Presence of intraocular lens: Secondary | ICD-10-CM | POA: Diagnosis not present

## 2013-05-20 DIAGNOSIS — H17819 Minor opacity of cornea, unspecified eye: Secondary | ICD-10-CM | POA: Diagnosis not present

## 2013-05-20 DIAGNOSIS — Z9889 Other specified postprocedural states: Secondary | ICD-10-CM | POA: Diagnosis not present

## 2013-05-20 DIAGNOSIS — Z48810 Encounter for surgical aftercare following surgery on the sense organs: Secondary | ICD-10-CM | POA: Diagnosis not present

## 2013-05-20 DIAGNOSIS — Z947 Corneal transplant status: Secondary | ICD-10-CM | POA: Diagnosis not present

## 2013-05-27 DIAGNOSIS — Z23 Encounter for immunization: Secondary | ICD-10-CM | POA: Diagnosis not present

## 2013-05-27 DIAGNOSIS — J189 Pneumonia, unspecified organism: Secondary | ICD-10-CM | POA: Diagnosis not present

## 2013-06-27 ENCOUNTER — Ambulatory Visit (HOSPITAL_COMMUNITY)
Admission: RE | Admit: 2013-06-27 | Discharge: 2013-06-27 | Disposition: A | Discharge status: Home / Self Care | Payer: Medicare Other | Source: Ambulatory Visit | Attending: Internal Medicine | Admitting: Internal Medicine

## 2013-06-27 ENCOUNTER — Other Ambulatory Visit (HOSPITAL_COMMUNITY): Payer: Self-pay | Admitting: Internal Medicine

## 2013-06-27 DIAGNOSIS — J189 Pneumonia, unspecified organism: Secondary | ICD-10-CM

## 2013-06-27 DIAGNOSIS — E119 Type 2 diabetes mellitus without complications: Secondary | ICD-10-CM | POA: Diagnosis not present

## 2013-06-27 DIAGNOSIS — Z09 Encounter for follow-up examination after completed treatment for conditions other than malignant neoplasm: Secondary | ICD-10-CM | POA: Insufficient documentation

## 2013-07-12 ENCOUNTER — Other Ambulatory Visit (HOSPITAL_COMMUNITY): Payer: Self-pay | Admitting: Internal Medicine

## 2013-07-12 ENCOUNTER — Ambulatory Visit (HOSPITAL_COMMUNITY)
Admission: RE | Admit: 2013-07-12 | Discharge: 2013-07-12 | Disposition: A | Discharge status: Home / Self Care | Payer: Medicare Other | Source: Ambulatory Visit | Attending: Internal Medicine | Admitting: Internal Medicine

## 2013-07-12 DIAGNOSIS — R05 Cough: Secondary | ICD-10-CM | POA: Insufficient documentation

## 2013-07-12 DIAGNOSIS — J189 Pneumonia, unspecified organism: Secondary | ICD-10-CM | POA: Diagnosis not present

## 2013-07-12 DIAGNOSIS — J121 Respiratory syncytial virus pneumonia: Secondary | ICD-10-CM | POA: Diagnosis not present

## 2013-07-12 DIAGNOSIS — R059 Cough, unspecified: Secondary | ICD-10-CM | POA: Diagnosis not present

## 2013-07-12 DIAGNOSIS — R509 Fever, unspecified: Secondary | ICD-10-CM | POA: Diagnosis not present

## 2013-08-12 DIAGNOSIS — Z79899 Other long term (current) drug therapy: Secondary | ICD-10-CM | POA: Diagnosis not present

## 2013-08-12 DIAGNOSIS — E119 Type 2 diabetes mellitus without complications: Secondary | ICD-10-CM | POA: Diagnosis not present

## 2013-08-12 DIAGNOSIS — E039 Hypothyroidism, unspecified: Secondary | ICD-10-CM | POA: Diagnosis not present

## 2013-08-18 DIAGNOSIS — E1129 Type 2 diabetes mellitus with other diabetic kidney complication: Secondary | ICD-10-CM | POA: Diagnosis not present

## 2013-08-18 DIAGNOSIS — I10 Essential (primary) hypertension: Secondary | ICD-10-CM | POA: Diagnosis not present

## 2013-09-19 DIAGNOSIS — M79609 Pain in unspecified limb: Secondary | ICD-10-CM | POA: Diagnosis not present

## 2013-09-19 DIAGNOSIS — M159 Polyosteoarthritis, unspecified: Secondary | ICD-10-CM | POA: Diagnosis not present

## 2013-09-19 DIAGNOSIS — M19049 Primary osteoarthritis, unspecified hand: Secondary | ICD-10-CM | POA: Diagnosis not present

## 2013-09-30 DIAGNOSIS — M1990 Unspecified osteoarthritis, unspecified site: Secondary | ICD-10-CM | POA: Diagnosis not present

## 2013-09-30 DIAGNOSIS — Z23 Encounter for immunization: Secondary | ICD-10-CM | POA: Diagnosis not present

## 2013-10-04 DIAGNOSIS — M25569 Pain in unspecified knee: Secondary | ICD-10-CM | POA: Diagnosis not present

## 2013-11-01 DIAGNOSIS — M25569 Pain in unspecified knee: Secondary | ICD-10-CM | POA: Diagnosis not present

## 2013-11-03 DIAGNOSIS — L570 Actinic keratosis: Secondary | ICD-10-CM | POA: Diagnosis not present

## 2013-11-03 DIAGNOSIS — Z85828 Personal history of other malignant neoplasm of skin: Secondary | ICD-10-CM | POA: Diagnosis not present

## 2013-11-03 DIAGNOSIS — D235 Other benign neoplasm of skin of trunk: Secondary | ICD-10-CM | POA: Diagnosis not present

## 2013-11-21 DIAGNOSIS — Z947 Corneal transplant status: Secondary | ICD-10-CM | POA: Diagnosis not present

## 2013-11-21 DIAGNOSIS — Z9849 Cataract extraction status, unspecified eye: Secondary | ICD-10-CM | POA: Diagnosis not present

## 2013-11-21 DIAGNOSIS — Z9889 Other specified postprocedural states: Secondary | ICD-10-CM | POA: Diagnosis not present

## 2013-11-21 DIAGNOSIS — Z961 Presence of intraocular lens: Secondary | ICD-10-CM | POA: Diagnosis not present

## 2013-11-21 DIAGNOSIS — Z48298 Encounter for aftercare following other organ transplant: Secondary | ICD-10-CM | POA: Diagnosis not present

## 2013-11-28 DIAGNOSIS — J019 Acute sinusitis, unspecified: Secondary | ICD-10-CM | POA: Diagnosis not present

## 2013-12-26 DIAGNOSIS — E119 Type 2 diabetes mellitus without complications: Secondary | ICD-10-CM | POA: Diagnosis not present

## 2014-01-05 DIAGNOSIS — E1129 Type 2 diabetes mellitus with other diabetic kidney complication: Secondary | ICD-10-CM | POA: Diagnosis not present

## 2014-01-05 DIAGNOSIS — I10 Essential (primary) hypertension: Secondary | ICD-10-CM | POA: Diagnosis not present

## 2014-01-23 ENCOUNTER — Encounter: Payer: Medicare Other | Admitting: Internal Medicine

## 2014-01-27 ENCOUNTER — Encounter: Payer: Self-pay | Admitting: Internal Medicine

## 2014-01-27 ENCOUNTER — Ambulatory Visit (INDEPENDENT_AMBULATORY_CARE_PROVIDER_SITE_OTHER): Payer: Medicare Other | Admitting: Internal Medicine

## 2014-01-27 ENCOUNTER — Telehealth: Payer: Self-pay | Admitting: *Deleted

## 2014-01-27 VITALS — BP 166/54 | HR 78 | Ht 71.0 in | Wt 249.8 lb

## 2014-01-27 DIAGNOSIS — Z950 Presence of cardiac pacemaker: Secondary | ICD-10-CM

## 2014-01-27 DIAGNOSIS — I495 Sick sinus syndrome: Secondary | ICD-10-CM

## 2014-01-27 DIAGNOSIS — I4891 Unspecified atrial fibrillation: Secondary | ICD-10-CM | POA: Diagnosis not present

## 2014-01-27 LAB — MDC_IDC_ENUM_SESS_TYPE_INCLINIC
Battery Remaining Longevity: 111.6 mo
Brady Statistic RA Percent Paced: 80 %
Brady Statistic RV Percent Paced: 1.1 %
Date Time Interrogation Session: 20150130090746
Lead Channel Impedance Value: 350 Ohm
Lead Channel Pacing Threshold Amplitude: 0.75 V
Lead Channel Pacing Threshold Pulse Width: 0.4 ms
Lead Channel Sensing Intrinsic Amplitude: 2.7 mV
Lead Channel Setting Pacing Amplitude: 1.125
MDC IDC MSMT BATTERY VOLTAGE: 3.01 V
MDC IDC MSMT LEADCHNL RA PACING THRESHOLD AMPLITUDE: 0.75 V
MDC IDC MSMT LEADCHNL RA PACING THRESHOLD PULSEWIDTH: 0.4 ms
MDC IDC MSMT LEADCHNL RV IMPEDANCE VALUE: 625 Ohm
MDC IDC MSMT LEADCHNL RV PACING THRESHOLD AMPLITUDE: 0.875 V
MDC IDC MSMT LEADCHNL RV PACING THRESHOLD PULSEWIDTH: 0.4 ms
MDC IDC MSMT LEADCHNL RV SENSING INTR AMPL: 7.6 mV
MDC IDC PG SERIAL: 7439597
MDC IDC SET LEADCHNL RA PACING AMPLITUDE: 2 V
MDC IDC SET LEADCHNL RV PACING PULSEWIDTH: 0.4 ms
MDC IDC SET LEADCHNL RV SENSING SENSITIVITY: 2 mV

## 2014-01-27 NOTE — Patient Instructions (Signed)
Your physician recommends that you schedule a follow-up appointment in: 1 year with Dr Knox Saliva will receive a reminder letter two months in advance reminding you to call and schedule your appointment. If you don't receive this letter, please contact our office.  Remote monitoring is used to monitor your Pacemaker or ICD from home. This monitoring reduces the number of office visits required to check your device to one time per year. It allows [place] to keep an eye on the functioning of your device to ensure it is working properly. You are scheduled for a device check from home on 05/03/2014. You may send your transmission at any time that day. If you have a wireless device, the transmission will be sent automatically. After your physician reviews your transmission, you will receive a postcard with your next transmission date.

## 2014-01-27 NOTE — Telephone Encounter (Signed)
Called pt's insurance. Awaiting the approval, it will take 48-72 hours. Once approved will send medication to pharmacy for Xarelto 20 mg daily with a 90 day supple.

## 2014-01-27 NOTE — Assessment & Plan Note (Signed)
His [employer] Jude dual-chamber pacemaker is working normally. We'll plan to recheck in several months.

## 2014-01-27 NOTE — Telephone Encounter (Signed)
Pt called and stated he wanted to try Xarelto. This nurse is trying to get a prior Auth. For pt to get 90 day supply.

## 2014-01-27 NOTE — Progress Notes (Signed)
HPI Clarence Dawson returns today for followup. He is a pleasant 68 yo man with a h/o symptomatic bradycardia due to sinus node dysfunction, s/p PPM. He underwent generator change 12 months ago. He has done well in the interim. No chest pain or sob. No syncope. He admits to sodium indiscretion. He has had mild peripheral edema. He does not experience palpitations. He has been quite busy caring for his wife who has undergone shoulder surgery twice. Allergies  Allergen Reactions  . Food Anaphylaxis and Shortness Of Breath    Tree nuts- breathing difficulty, seafood- breathing difficulty  . Iodine Itching and Other (See Comments)    Reaction unknown  . Voltaren [Diclofenac Sodium] Other (See Comments)    Feels like things are crawling on him     Current Outpatient Prescriptions  Medication Sig Dispense Refill  . acetaminophen (TYLENOL) 500 MG tablet Take 500 mg by mouth every 6 (six) hours as needed. Pt said he takes 2 500 mg tablets  Every 6-8 hours ( averages 1 gm  Tid) and was told he could do so      . amLODipine (NORVASC) 10 MG tablet Take 10 mg by mouth daily.        Marland Kitchen aspirin 81 MG tablet Take 81 mg by mouth daily.      Marland Kitchen atorvastatin (LIPITOR) 40 MG tablet Take 40 mg by mouth daily.      . calcium carbonate (TUMS - DOSED IN MG ELEMENTAL CALCIUM) 500 MG chewable tablet Chew 1 tablet by mouth daily as needed for heartburn. As needed only      . cloNIDine (CATAPRES) 0.1 MG tablet Take 0.1 mg by mouth 2 (two) times daily.      . Fluticasone-Salmeterol (ADVAIR DISKUS) 250-50 MCG/DOSE AEPB Inhale 1 puff into the lungs every 12 (twelve) hours.        Marland Kitchen levothyroxine (SYNTHROID, LEVOTHROID) 175 MCG tablet Take 175 mcg by mouth daily.        Marland Kitchen losartan-hydrochlorothiazide (HYZAAR) 100-25 MG per tablet Take 1 tablet by mouth daily.      . meloxicam (MOBIC) 15 MG tablet Take 15 mg by mouth daily.      . metFORMIN (GLUCOPHAGE) 500 MG tablet Take 500 mg by mouth 2 (two) times daily with a meal.       .  metoprolol (LOPRESSOR) 100 MG tablet Take 100 mg by mouth 2 (two) times daily.       . Multiple Vitamin (MULTIVITAMIN) tablet Take 1 tablet by mouth daily.        . nateglinide (STARLIX) 120 MG tablet Pt takes one tablet daily before lunch       No current facility-administered medications for this visit.     Past Medical History  Diagnosis Date  . Hypertension   . Allergic rhinitis   . Atrial fibrillation   . Syncope   . Sinoatrial node dysfunction   . 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 Hyperlipidemia   . RBBB (right bundle branch block)     Coronary Artherosclerosis  . Tricuspid valve disorder   . Aortic valve disorder   . Carotid artery stenosis   . Diabetes   . Hemorrhage of rectum   . Pacemaker     Oct 2005 in [place].  . Asthma     since childhood- seasonal allergies induced  . OSA (obstructive sleep apnea)     done in [place] 2005- place no longer in business  . Arthritis     ostoearthritis  .  Sinoatrial node dysfunction   . Hypothyroidism   . GERD (gastroesophageal reflux disease)   . H/O hiatal hernia     ROS:   All systems reviewed and negative except as noted in the HPI.   Past Surgical History  Procedure Laterality Date  . Gallbladder surgery  12/01/2006  . Rotator cuff surgery  2001    Right shoulder  . Carpal tunnel release  1994    right wrist  . Arthropathy  2005    Rebuilding of left thumb and joint   . Hemorroidectomy  2003  . Circumcision    . A-v cardiac pacemaker insertion      Sick sinus syndrome DDR pacer  . Cardiac electrophysiology study and ablation  09/2008    for pvcs, Dr. Loralie Champagne  . Left knee arthroscopy      April 21 2011- [facility]  . Carpal tunnel release  05/04/2012    Procedure: CARPAL TUNNEL RELEASE;  Surgeon: Wynonia Sours, MD;  Location: [place];  Service: Orthopedics;  Laterality: Left;  . Cardiac catheterization    . 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 Tonsillectomy    . Cholecystectomy  1994  . Partial knee arthroplasty  11/22/2012    Procedure: UNICOMPARTMENTAL KNEE;  Surgeon: Lorn Junes, MD;  Location: [place];  Service: Orthopedics;  Laterality: Left;  left unicompartmental knee arthroplasty  . Hand tendon surgery Left late 1990's    thumb  . Colonoscopy N/A 03/14/2013    Procedure: COLONOSCOPY;  Surgeon: Daneil Dolin, MD;  [facility];  Service: Endoscopy;  Laterality: N/A;  8:15 AM     Family History  Problem Relation Age of Onset  . Other Father 48    Sudden Cardiac death  . Pancreatic cancer Mother   . Colon cancer Mother   . Colon polyps Neg Hx   . Colon cancer Maternal Aunt      History   Social History  . Marital Status: Married    Spouse Name: N/A    Number of Children: N/A  . Years of Education: N/A   Occupational History  . Not on file.   Social History Main Topics  . Smoking status: Former Smoker -- 1.00 packs/day for 25 years    Quit date: 02/12/1984  . Smokeless tobacco: Not on file  . Alcohol Use: 1.2 oz/week    1 Cans of beer, 1 Glasses of wine per week     Comment: 1 can of beer or glasse of wine daily  . Drug Use: No  . Sexual Activity: Not on file   Other Topics Concern  . Not on file   Social History Narrative   Regular exercise: No     BP 166/54  Pulse 78  Ht 5\' 11"  (1.803 m)  Wt 249 lb 12.8 oz (113.309 kg)  BMI 34.86 kg/m2  Physical Exam:  Well appearing 68 yo man, NAD HEENT: Unremarkable Neck:  7 cm JVD, no thyromegally Lungs:  Clear with no wheezes, well healed PPM incision. HEART:  Regular rate rhythm, no murmurs, no rubs, no clicks Abd:  soft, positive bowel sounds, no organomegally, no rebound, no guarding Ext:  2 plus pulses, no edema, no cyanosis, no clubbing Skin:  No rashes no nodules Neuro:  CN II through XII intact, motor grossly intact  DEVICE  Normal  device function.  See PaceArt for details.    Assess/Plan:

## 2014-01-27 NOTE — Patient Instructions (Addendum)
Your physician recommends that you schedule a follow-up appointment in: 1 year with Dr Knox Saliva will receive a reminder letter two months in advance reminding you to call and schedule your appointment. If you don't receive this letter, please contact our office.  Remote monitoring is used to monitor your Pacemaker or ICD from home. This monitoring reduces the number of office visits required to check your device to one time per year. It allows [place] to keep an eye on the functioning of your device to ensure it is working properly. You are scheduled for a device check from home on 05/03/2014. You may send your transmission at any time that day. If you have a wireless device, the transmission will be sent automatically. After your physician reviews your transmission, you will receive a postcard with your next transmission date.  Check with pharmacy on which medications the pharmacy will cover: Pradaxa 150 mg Twice a day Xarelto 20 mg Daily Eliquis 5 mg Twice a day

## 2014-01-27 NOTE — Assessment & Plan Note (Signed)
The patient continues to have atrial fibrillation and his frequency has increased. His longest episode was approximately 10 hours. I've discussed the treatment options with the patient in detail. The risk and benefit of initiation of anticoagulation has been outlined. His CHADSVASC score is 3. The discussion has been as to which medication would be most appropriate. He does not with to start Coumadin. I've given him the names of all 3 of our newer novel agents. Any of these 3 would be reasonable. The patient would like to check with his prescription plan coverage to find out which medication would be least expensive. He states that he will call us and let us know which of the 3 medications he would like to start on and we will call them in. I'll plan to see the patient back in approximately one year for followup. He is instructed to let us know if there is any issue with initiation of systemic anticoagulation.

## 2014-01-30 ENCOUNTER — Telehealth: Payer: Self-pay

## 2014-01-30 NOTE — Telephone Encounter (Signed)
Received fax from [facility] denial of coverage for Xarelto 20 mg.  Document in refill bin for nurse review.

## 2014-01-31 NOTE — Telephone Encounter (Signed)
I called [employer] again at [phone] to speak with the"Coverage determination team" and Spoke with "Judeth Porch" who then transferred me to a voice mail.I again left a message that we are asking for an expedited appeal to get this patient Clarence Dawson.The VM states that a return phone call should come within 24 hrs.As I will not be in office tomorrow 02/01/14,I asked that [facility] or Maudie Mercury be notified

## 2014-01-31 NOTE — Telephone Encounter (Signed)
I attempted to settle this issue yesterday,was transferred to 5 different  people, including customer service(Latrice),pre-auth rep,pharmacy (April) and dept that handle denials/appeals.Since this medication was already denied by his insurance,pt has to file appeal. I attempted to do this yesterday at 530pm and got voicemail. It said if this needed to be an urgent expedited issue for patient health to leave message and we would hear back in 72 hrs.    I will attempt again today

## 2014-01-31 NOTE — Telephone Encounter (Signed)
I left message on patients VM that I have samples of Xarelto for him at front desk

## 2014-02-01 MED ORDER — RIVAROXABAN 20 MG PO TABS: 20.0000 mg | ORAL_TABLET | Freq: Every day | ORAL | Status: DC

## 2014-02-01 NOTE — Addendum Note (Signed)
Addended by: Truett Mainland on: 02/01/2014 05:41 PM   Modules accepted: Orders

## 2014-02-01 NOTE — Telephone Encounter (Signed)
This nurse called [employer] at [phone] and states that it is paying through and is approved. Will send to pharmacy

## 2014-02-02 NOTE — Telephone Encounter (Signed)
Per [employer],pt is covered for next year with xarelto

## 2014-03-23 ENCOUNTER — Telehealth: Payer: Self-pay | Admitting: Internal Medicine

## 2014-03-23 DIAGNOSIS — M25569 Pain in unspecified knee: Secondary | ICD-10-CM | POA: Diagnosis not present

## 2014-03-23 DIAGNOSIS — M25469 Effusion, unspecified knee: Secondary | ICD-10-CM | POA: Diagnosis not present

## 2014-03-23 DIAGNOSIS — S72413A Displaced unspecified condyle fracture of lower end of unspecified femur, initial encounter for closed fracture: Secondary | ICD-10-CM | POA: Diagnosis not present

## 2014-03-23 NOTE — Telephone Encounter (Signed)
Please call patient regarding him being started on Xarelto and having to go to doctor and have blood drawn off his knee / tgs

## 2014-03-24 NOTE — Telephone Encounter (Signed)
Patient had 80 ml bloody fluid tapped from left knee by Dr.Wainer.He was told he had small bone chip fx,placed in knee immobilizer.Was to told to tell you this because he is on Xarelto

## 2014-03-28 NOTE — Telephone Encounter (Signed)
Xarelto can be held for a week. The tap was likely traumatic. I have not seen Xarelto cause a hemarthrosis. If no additional knee procedures are planned, xarelto can be restarted a week after the tap.

## 2014-03-29 NOTE — Telephone Encounter (Signed)
Spoke with Pt, he already had the procedure done. Dr Noemi Chapel told pt that it was ok to stay on Xarelto.

## 2014-03-30 DIAGNOSIS — S83419A Sprain of medial collateral ligament of unspecified knee, initial encounter: Secondary | ICD-10-CM | POA: Diagnosis not present

## 2014-04-10 DIAGNOSIS — E785 Hyperlipidemia, unspecified: Secondary | ICD-10-CM | POA: Diagnosis not present

## 2014-04-10 DIAGNOSIS — I10 Essential (primary) hypertension: Secondary | ICD-10-CM | POA: Diagnosis not present

## 2014-04-10 DIAGNOSIS — Z79899 Other long term (current) drug therapy: Secondary | ICD-10-CM | POA: Diagnosis not present

## 2014-04-10 DIAGNOSIS — J121 Respiratory syncytial virus pneumonia: Secondary | ICD-10-CM | POA: Diagnosis not present

## 2014-04-10 DIAGNOSIS — E039 Hypothyroidism, unspecified: Secondary | ICD-10-CM | POA: Diagnosis not present

## 2014-04-10 DIAGNOSIS — E119 Type 2 diabetes mellitus without complications: Secondary | ICD-10-CM | POA: Diagnosis not present

## 2014-04-13 DIAGNOSIS — M25569 Pain in unspecified knee: Secondary | ICD-10-CM | POA: Diagnosis not present

## 2014-04-17 DIAGNOSIS — I10 Essential (primary) hypertension: Secondary | ICD-10-CM | POA: Diagnosis not present

## 2014-04-17 DIAGNOSIS — E785 Hyperlipidemia, unspecified: Secondary | ICD-10-CM | POA: Diagnosis not present

## 2014-04-17 DIAGNOSIS — E1129 Type 2 diabetes mellitus with other diabetic kidney complication: Secondary | ICD-10-CM | POA: Diagnosis not present

## 2014-04-27 DIAGNOSIS — M25569 Pain in unspecified knee: Secondary | ICD-10-CM | POA: Diagnosis not present

## 2014-05-03 ENCOUNTER — Encounter: Payer: Medicare Other | Admitting: *Deleted

## 2014-05-04 DIAGNOSIS — M19049 Primary osteoarthritis, unspecified hand: Secondary | ICD-10-CM | POA: Diagnosis not present

## 2014-05-04 DIAGNOSIS — L570 Actinic keratosis: Secondary | ICD-10-CM | POA: Diagnosis not present

## 2014-05-04 DIAGNOSIS — D235 Other benign neoplasm of skin of trunk: Secondary | ICD-10-CM | POA: Diagnosis not present

## 2014-05-04 DIAGNOSIS — C44519 Basal cell carcinoma of skin of other part of trunk: Secondary | ICD-10-CM | POA: Diagnosis not present

## 2014-05-04 DIAGNOSIS — C44621 Squamous cell carcinoma of skin of unspecified upper limb, including shoulder: Secondary | ICD-10-CM | POA: Diagnosis not present

## 2014-05-12 ENCOUNTER — Encounter: Payer: Self-pay | Admitting: Cardiology

## 2014-05-18 DIAGNOSIS — M25569 Pain in unspecified knee: Secondary | ICD-10-CM | POA: Diagnosis not present

## 2014-06-01 DIAGNOSIS — G5600 Carpal tunnel syndrome, unspecified upper limb: Secondary | ICD-10-CM | POA: Diagnosis not present

## 2014-06-01 DIAGNOSIS — M19049 Primary osteoarthritis, unspecified hand: Secondary | ICD-10-CM | POA: Diagnosis not present

## 2014-06-08 DIAGNOSIS — Z85828 Personal history of other malignant neoplasm of skin: Secondary | ICD-10-CM | POA: Diagnosis not present

## 2014-07-31 ENCOUNTER — Other Ambulatory Visit: Payer: Self-pay | Admitting: Internal Medicine

## 2014-08-01 DIAGNOSIS — E119 Type 2 diabetes mellitus without complications: Secondary | ICD-10-CM | POA: Diagnosis not present

## 2014-08-01 DIAGNOSIS — E785 Hyperlipidemia, unspecified: Secondary | ICD-10-CM | POA: Diagnosis not present

## 2014-08-10 DIAGNOSIS — I10 Essential (primary) hypertension: Secondary | ICD-10-CM | POA: Diagnosis not present

## 2014-08-10 DIAGNOSIS — E1129 Type 2 diabetes mellitus with other diabetic kidney complication: Secondary | ICD-10-CM | POA: Diagnosis not present

## 2014-08-10 DIAGNOSIS — E785 Hyperlipidemia, unspecified: Secondary | ICD-10-CM | POA: Diagnosis not present

## 2014-09-20 DIAGNOSIS — M19049 Primary osteoarthritis, unspecified hand: Secondary | ICD-10-CM | POA: Diagnosis not present

## 2014-10-10 ENCOUNTER — Encounter: Payer: Self-pay | Admitting: Cardiology

## 2014-10-16 ENCOUNTER — Telehealth: Payer: Self-pay | Admitting: *Deleted

## 2014-10-16 DIAGNOSIS — Z23 Encounter for immunization: Secondary | ICD-10-CM | POA: Diagnosis not present

## 2014-10-16 NOTE — Telephone Encounter (Signed)
Pt wanted clarification about recent letter received. Pt states he was told by someone in our clinic in the past who stated we have been receiving his remotes.  I did not see a phone record of any kind since last visit on 01/27/14. There are also no records of ever receiving a remote; no connection also never confirmed on [employer]. I entered in serial number for Merlin. I gave instructions to send a manual transmission but his unit would not detect his device. Gave pt tech svcs number to further trouble shoot why patient icon gives error notification.

## 2014-10-20 DIAGNOSIS — S46011A Strain of muscle(s) and tendon(s) of the rotator cuff of right shoulder, initial encounter: Secondary | ICD-10-CM | POA: Diagnosis not present

## 2014-10-20 DIAGNOSIS — M25511 Pain in right shoulder: Secondary | ICD-10-CM | POA: Diagnosis not present

## 2014-10-26 ENCOUNTER — Ambulatory Visit (INDEPENDENT_AMBULATORY_CARE_PROVIDER_SITE_OTHER): Payer: Medicare Other | Admitting: *Deleted

## 2014-10-26 DIAGNOSIS — I495 Sick sinus syndrome: Secondary | ICD-10-CM | POA: Diagnosis not present

## 2014-10-26 LAB — MDC_IDC_ENUM_SESS_TYPE_REMOTE
Implantable Pulse Generator Model: 2210
Lead Channel Sensing Intrinsic Amplitude: 10.8 mV
Lead Channel Sensing Intrinsic Amplitude: 3.1 mV
Lead Channel Setting Pacing Amplitude: 2 V
Lead Channel Setting Pacing Pulse Width: 0.4 ms
MDC IDC MSMT LEADCHNL RV PACING THRESHOLD AMPLITUDE: 0.625 V
MDC IDC MSMT LEADCHNL RV PACING THRESHOLD PULSEWIDTH: 0.4 ms
MDC IDC PG SERIAL: 7439597
MDC IDC SET LEADCHNL RV PACING AMPLITUDE: 1.125
MDC IDC SET LEADCHNL RV SENSING SENSITIVITY: 2 mV

## 2014-10-30 NOTE — Progress Notes (Signed)
Remote pacemaker transmission.   

## 2014-11-01 ENCOUNTER — Telehealth: Payer: Self-pay | Admitting: Internal Medicine

## 2014-11-01 DIAGNOSIS — M1811 Unilateral primary osteoarthritis of first carpometacarpal joint, right hand: Secondary | ICD-10-CM | POA: Diagnosis not present

## 2014-11-01 NOTE — Telephone Encounter (Signed)
New message     Having hand surgery on 11-16-14 by Dr Fredna Dow.  Should pt stop xeralto prior to surgery?

## 2014-11-01 NOTE — Telephone Encounter (Signed)
Pt was informed dr/nurse are out of office but will hear back with advice, pt was agreeable to plan.

## 2014-11-02 ENCOUNTER — Other Ambulatory Visit: Payer: Self-pay | Admitting: Orthopedic Surgery

## 2014-11-02 NOTE — Telephone Encounter (Signed)
Spoke with patients wife and let her know that the surgeon's office usually calls for request to stop.  The recommendation is usually to stop 24 hours prior and resume the night after the procedure.  Let her know we would wait to obtain the request from the surgeons office

## 2014-11-13 ENCOUNTER — Encounter (HOSPITAL_BASED_OUTPATIENT_CLINIC_OR_DEPARTMENT_OTHER): Payer: Self-pay | Admitting: *Deleted

## 2014-11-13 NOTE — Progress Notes (Signed)
Pt active-has pacemaker-will need istat Has a cpap-wife states he does not use all the time-he will need to bring it and use post op

## 2014-11-15 ENCOUNTER — Encounter: Payer: Self-pay | Admitting: Cardiology

## 2014-11-15 NOTE — Progress Notes (Signed)
Reviewed with dr crews=ok for surgery

## 2014-11-16 ENCOUNTER — Encounter (HOSPITAL_BASED_OUTPATIENT_CLINIC_OR_DEPARTMENT_OTHER): Payer: Self-pay | Admitting: *Deleted

## 2014-11-16 ENCOUNTER — Ambulatory Visit (HOSPITAL_BASED_OUTPATIENT_CLINIC_OR_DEPARTMENT_OTHER)
Admission: RE | Admit: 2014-11-16 | Discharge: 2014-11-16 | Disposition: A | Discharge status: Home / Self Care | Payer: Medicare Other | Source: Ambulatory Visit | Attending: Orthopedic Surgery | Admitting: Orthopedic Surgery

## 2014-11-16 ENCOUNTER — Ambulatory Visit (HOSPITAL_BASED_OUTPATIENT_CLINIC_OR_DEPARTMENT_OTHER): Payer: Medicare Other | Admitting: Anesthesiology

## 2014-11-16 ENCOUNTER — Encounter (HOSPITAL_BASED_OUTPATIENT_CLINIC_OR_DEPARTMENT_OTHER)
Admission: RE | Disposition: A | Discharge status: Home / Self Care | Payer: Self-pay | Source: Ambulatory Visit | Attending: Orthopedic Surgery

## 2014-11-16 DIAGNOSIS — J45909 Unspecified asthma, uncomplicated: Secondary | ICD-10-CM | POA: Insufficient documentation

## 2014-11-16 DIAGNOSIS — E039 Hypothyroidism, unspecified: Secondary | ICD-10-CM | POA: Insufficient documentation

## 2014-11-16 DIAGNOSIS — Z7982 Long term (current) use of aspirin: Secondary | ICD-10-CM | POA: Insufficient documentation

## 2014-11-16 DIAGNOSIS — K219 Gastro-esophageal reflux disease without esophagitis: Secondary | ICD-10-CM | POA: Diagnosis not present

## 2014-11-16 DIAGNOSIS — Z87891 Personal history of nicotine dependence: Secondary | ICD-10-CM | POA: Diagnosis not present

## 2014-11-16 DIAGNOSIS — E785 Hyperlipidemia, unspecified: Secondary | ICD-10-CM | POA: Insufficient documentation

## 2014-11-16 DIAGNOSIS — Z91018 Allergy to other foods: Secondary | ICD-10-CM | POA: Insufficient documentation

## 2014-11-16 DIAGNOSIS — I4891 Unspecified atrial fibrillation: Secondary | ICD-10-CM | POA: Diagnosis not present

## 2014-11-16 DIAGNOSIS — Z950 Presence of cardiac pacemaker: Secondary | ICD-10-CM | POA: Insufficient documentation

## 2014-11-16 DIAGNOSIS — Z883 Allergy status to other anti-infective agents status: Secondary | ICD-10-CM | POA: Insufficient documentation

## 2014-11-16 DIAGNOSIS — G4733 Obstructive sleep apnea (adult) (pediatric): Secondary | ICD-10-CM | POA: Diagnosis not present

## 2014-11-16 DIAGNOSIS — Z91013 Allergy to seafood: Secondary | ICD-10-CM | POA: Diagnosis not present

## 2014-11-16 DIAGNOSIS — M189 Osteoarthritis of first carpometacarpal joint, unspecified: Secondary | ICD-10-CM | POA: Diagnosis not present

## 2014-11-16 DIAGNOSIS — Z79899 Other long term (current) drug therapy: Secondary | ICD-10-CM | POA: Diagnosis not present

## 2014-11-16 DIAGNOSIS — M79641 Pain in right hand: Secondary | ICD-10-CM | POA: Diagnosis not present

## 2014-11-16 DIAGNOSIS — Z7901 Long term (current) use of anticoagulants: Secondary | ICD-10-CM | POA: Insufficient documentation

## 2014-11-16 DIAGNOSIS — M19049 Primary osteoarthritis, unspecified hand: Secondary | ICD-10-CM | POA: Diagnosis not present

## 2014-11-16 DIAGNOSIS — I10 Essential (primary) hypertension: Secondary | ICD-10-CM | POA: Diagnosis not present

## 2014-11-16 DIAGNOSIS — I6529 Occlusion and stenosis of unspecified carotid artery: Secondary | ICD-10-CM | POA: Insufficient documentation

## 2014-11-16 DIAGNOSIS — E119 Type 2 diabetes mellitus without complications: Secondary | ICD-10-CM | POA: Insufficient documentation

## 2014-11-16 DIAGNOSIS — G8918 Other acute postprocedural pain: Secondary | ICD-10-CM | POA: Diagnosis not present

## 2014-11-16 DIAGNOSIS — Z888 Allergy status to other drugs, medicaments and biological substances status: Secondary | ICD-10-CM | POA: Insufficient documentation

## 2014-11-16 DIAGNOSIS — M1811 Unilateral primary osteoarthritis of first carpometacarpal joint, right hand: Secondary | ICD-10-CM | POA: Diagnosis not present

## 2014-11-16 HISTORY — DX: Complete loss of teeth, unspecified cause, unspecified class: K08.109

## 2014-11-16 HISTORY — DX: Complete loss of teeth, unspecified cause, unspecified class: Z97.2

## 2014-11-16 HISTORY — PX: CARPOMETACARPEL SUSPENSION PLASTY: SHX5005

## 2014-11-16 LAB — POCT I-STAT, CHEM 8
BUN: 22 mg/dL (ref 6–23)
CHLORIDE: 105 meq/L (ref 96–112)
Calcium, Ion: 1.23 mmol/L (ref 1.13–1.30)
Creatinine, Ser: 0.9 mg/dL (ref 0.50–1.35)
GLUCOSE: 142 mg/dL — AB (ref 70–99)
HCT: 52 % (ref 39.0–52.0)
Hemoglobin: 17.7 g/dL — ABNORMAL HIGH (ref 13.0–17.0)
Potassium: 4.4 mEq/L (ref 3.7–5.3)
SODIUM: 143 meq/L (ref 137–147)
TCO2: 25 mmol/L (ref 0–100)

## 2014-11-16 LAB — GLUCOSE, CAPILLARY: GLUCOSE-CAPILLARY: 135 mg/dL — AB (ref 70–99)

## 2014-11-16 SURGERY — CARPOMETACARPEL (CMC) SUSPENSION PLASTY
Anesthesia: Choice | Site: Hand | Laterality: Right

## 2014-11-16 MED ORDER — CEFAZOLIN SODIUM-DEXTROSE 2-3 GM-% IV SOLR: 2.0000 g | INTRAVENOUS | Status: DC

## 2014-11-16 MED ORDER — HYDROMORPHONE HCL 1 MG/ML IJ SOLN: 0.2500 mg | INTRAMUSCULAR | Status: DC | PRN

## 2014-11-16 MED ORDER — CHLORHEXIDINE GLUCONATE 4 % EX LIQD: 60.0000 mL | Freq: Once | CUTANEOUS | Status: DC

## 2014-11-16 MED ORDER — MIDAZOLAM HCL 2 MG/2ML IJ SOLN: 1.0000 mg | INTRAMUSCULAR | Status: DC | PRN

## 2014-11-16 MED ORDER — OXYCODONE HCL 5 MG/5ML PO SOLN: 5.0000 mg | Freq: Once | ORAL | Status: DC | PRN

## 2014-11-16 MED ORDER — CEFAZOLIN SODIUM-DEXTROSE 2-3 GM-% IV SOLR
INTRAVENOUS | Status: AC
  Filled 2014-11-16: qty 50

## 2014-11-16 MED ORDER — FENTANYL CITRATE 0.05 MG/ML IJ SOLN
INTRAMUSCULAR | Status: AC
  Filled 2014-11-16: qty 6

## 2014-11-16 MED ORDER — MIDAZOLAM HCL 2 MG/2ML IJ SOLN
INTRAMUSCULAR | Status: AC
  Filled 2014-11-16: qty 2

## 2014-11-16 MED ORDER — LIDOCAINE HCL (CARDIAC) 20 MG/ML IV SOLN
INTRAVENOUS | Status: DC | PRN
  Administered 2014-11-16: 30 mg via INTRAVENOUS

## 2014-11-16 MED ORDER — PROPOFOL 10 MG/ML IV BOLUS
INTRAVENOUS | Status: DC | PRN
  Administered 2014-11-16: 150 mg via INTRAVENOUS

## 2014-11-16 MED ORDER — ONDANSETRON HCL 4 MG/2ML IJ SOLN: 4.0000 mg | Freq: Once | INTRAMUSCULAR | Status: DC | PRN

## 2014-11-16 MED ORDER — CEFAZOLIN SODIUM-DEXTROSE 2-3 GM-% IV SOLR
2.0000 g | INTRAVENOUS | Status: AC
  Administered 2014-11-16: 2 g via INTRAVENOUS

## 2014-11-16 MED ORDER — FENTANYL CITRATE 0.05 MG/ML IJ SOLN
INTRAMUSCULAR | Status: AC
  Filled 2014-11-16: qty 2

## 2014-11-16 MED ORDER — LACTATED RINGERS IV SOLN
INTRAVENOUS | Status: DC
Start: 2014-11-16 — End: 2014-11-16
  Administered 2014-11-16: 11:00:00 via INTRAVENOUS

## 2014-11-16 MED ORDER — OXYCODONE HCL 5 MG PO TABS: 5.0000 mg | ORAL_TABLET | Freq: Once | ORAL | Status: DC | PRN

## 2014-11-16 MED ORDER — ONDANSETRON HCL 4 MG/2ML IJ SOLN
INTRAMUSCULAR | Status: DC | PRN
  Administered 2014-11-16: 4 mg via INTRAVENOUS

## 2014-11-16 MED ORDER — 0.9 % SODIUM CHLORIDE (POUR BTL) OPTIME
TOPICAL | Status: DC | PRN
  Administered 2014-11-16: 200 mL

## 2014-11-16 MED ORDER — HYDROCODONE-ACETAMINOPHEN 7.5-325 MG/15ML PO SOLN: 15.0000 mL | Freq: Four times a day (QID) | ORAL | Status: DC | PRN

## 2014-11-16 MED ORDER — OXYCODONE-ACETAMINOPHEN 10-325 MG PO TABS: 1.0000 | ORAL_TABLET | ORAL | Status: DC | PRN

## 2014-11-16 MED ORDER — HYDROCODONE-ACETAMINOPHEN 10-325 MG PO TABS: 1.0000 | ORAL_TABLET | Freq: Four times a day (QID) | ORAL | Status: DC | PRN

## 2014-11-16 MED ORDER — DEXAMETHASONE SODIUM PHOSPHATE 10 MG/ML IJ SOLN
INTRAMUSCULAR | Status: DC | PRN
Start: 2014-11-16 — End: 2014-11-16
  Administered 2014-11-16: 4 mg via INTRAVENOUS

## 2014-11-16 MED ORDER — FENTANYL CITRATE 0.05 MG/ML IJ SOLN: 50.0000 ug | INTRAMUSCULAR | Status: DC | PRN

## 2014-11-16 SURGICAL SUPPLY — 64 items
BLADE ARTHRO LOK 4 BEAVER (BLADE) IMPLANT
BLADE ARTHRO LOK 4MM BEAVER (BLADE)
BLADE MINI RND TIP GREEN BEAV (BLADE) ×3 IMPLANT
BLADE SURG 15 STRL LF DISP TIS (BLADE) ×1 IMPLANT
BLADE SURG 15 STRL SS (BLADE) ×2
BNDG COHESIVE 3X5 TAN STRL LF (GAUZE/BANDAGES/DRESSINGS) ×3 IMPLANT
BNDG ESMARK 4X9 LF (GAUZE/BANDAGES/DRESSINGS) ×3 IMPLANT
BNDG GAUZE ELAST 4 BULKY (GAUZE/BANDAGES/DRESSINGS) ×3 IMPLANT
BUR EGG 3PK/BX (BURR) IMPLANT
CHLORAPREP W/TINT 26ML (MISCELLANEOUS) ×3 IMPLANT
CLOSURE WOUND 1/4X4 (GAUZE/BANDAGES/DRESSINGS)
CORDS BIPOLAR (ELECTRODE) ×3 IMPLANT
COVER BACK TABLE 60X90IN (DRAPES) ×3 IMPLANT
COVER MAYO STAND STRL (DRAPES) ×3 IMPLANT
CUFF TOURNIQUET SINGLE 18IN (TOURNIQUET CUFF) ×3 IMPLANT
DECANTER SPIKE VIAL GLASS SM (MISCELLANEOUS) IMPLANT
DRAPE EXTREMITY T 121X128X90 (DRAPE) ×3 IMPLANT
DRAPE OEC MINIVIEW 54X84 (DRAPES) ×3 IMPLANT
DRAPE SURG 17X23 STRL (DRAPES) ×3 IMPLANT
GAUZE SPONGE 4X4 12PLY STRL (GAUZE/BANDAGES/DRESSINGS) ×3 IMPLANT
GAUZE SPONGE 4X4 16PLY XRAY LF (GAUZE/BANDAGES/DRESSINGS) IMPLANT
GAUZE XEROFORM 1X8 LF (GAUZE/BANDAGES/DRESSINGS) ×3 IMPLANT
GLOVE BIOGEL PI IND STRL 7.0 (GLOVE) ×1 IMPLANT
GLOVE BIOGEL PI IND STRL 8.5 (GLOVE) ×2 IMPLANT
GLOVE BIOGEL PI INDICATOR 7.0 (GLOVE) ×2
GLOVE BIOGEL PI INDICATOR 8.5 (GLOVE) ×4
GLOVE ECLIPSE 6.5 STRL STRAW (GLOVE) ×3 IMPLANT
GLOVE SURG ORTHO 8.0 STRL STRW (GLOVE) ×6 IMPLANT
GOWN STRL REUS W/ TWL LRG LVL3 (GOWN DISPOSABLE) ×2 IMPLANT
GOWN STRL REUS W/TWL LRG LVL3 (GOWN DISPOSABLE) ×4
GOWN STRL REUS W/TWL XL LVL3 (GOWN DISPOSABLE) ×3 IMPLANT
NEEDLE 27GAX1X1/2 (NEEDLE) IMPLANT
NS IRRIG 1000ML POUR BTL (IV SOLUTION) ×3 IMPLANT
PACK BASIN DAY SURGERY FS (CUSTOM PROCEDURE TRAY) ×3 IMPLANT
PAD CAST 3X4 CTTN HI CHSV (CAST SUPPLIES) ×1 IMPLANT
PADDING CAST ABS 3INX4YD NS (CAST SUPPLIES) ×2
PADDING CAST ABS 4INX4YD NS (CAST SUPPLIES)
PADDING CAST ABS COTTON 3X4 (CAST SUPPLIES) ×1 IMPLANT
PADDING CAST ABS COTTON 4X4 ST (CAST SUPPLIES) IMPLANT
PADDING CAST COTTON 3X4 STRL (CAST SUPPLIES) ×2
RUBBERBAND STERILE (MISCELLANEOUS) IMPLANT
SLEEVE SCD COMPRESS KNEE MED (MISCELLANEOUS) ×3 IMPLANT
SPLINT PLASTER CAST XFAST 3X15 (CAST SUPPLIES) ×20 IMPLANT
SPLINT PLASTER XTRA FASTSET 3X (CAST SUPPLIES) ×40
STOCKINETTE 4X48 STRL (DRAPES) ×3 IMPLANT
STRIP CLOSURE SKIN 1/4X4 (GAUZE/BANDAGES/DRESSINGS) IMPLANT
SUT ETHIBOND 2 OS 4 DA (SUTURE) IMPLANT
SUT ETHIBOND 3-0 V-5 (SUTURE) IMPLANT
SUT FIBERWIRE 2-0 18 17.9 3/8 (SUTURE)
SUT FIBERWIRE 4-0 18 DIAM BLUE (SUTURE) ×3
SUT MERSILENE 4 0 P 3 (SUTURE) IMPLANT
SUT STEEL 3 0 (SUTURE) IMPLANT
SUT STEEL 4 0 (SUTURE) ×3 IMPLANT
SUT VIC AB 4-0 P-3 18XBRD (SUTURE) ×1 IMPLANT
SUT VIC AB 4-0 P2 18 (SUTURE) IMPLANT
SUT VIC AB 4-0 P3 18 (SUTURE) ×2
SUT VICRYL RAPID 5 0 P 3 (SUTURE) IMPLANT
SUT VICRYL RAPIDE 4/0 PS 2 (SUTURE) ×6 IMPLANT
SUTURE FIBERWR 2-0 18 17.9 3/8 (SUTURE) IMPLANT
SUTURE FIBERWR 4-0 18 DIA BLUE (SUTURE) ×1 IMPLANT
SYR BULB 3OZ (MISCELLANEOUS) ×3 IMPLANT
SYR CONTROL 10ML LL (SYRINGE) IMPLANT
TOWEL OR 17X24 6PK STRL BLUE (TOWEL DISPOSABLE) ×6 IMPLANT
UNDERPAD 30X30 INCONTINENT (UNDERPADS AND DIAPERS) ×3 IMPLANT

## 2014-11-16 NOTE — Op Note (Signed)
Dictation Number [id number]

## 2014-11-16 NOTE — Anesthesia Procedure Notes (Addendum)
Procedure Name: LMA Insertion Date/Time: 11/16/2014 1:27 PM Performed by: BLOCKER, TIMOTHY Pre-anesthesia Checklist: Patient identified, Emergency Drugs available, Suction available and Patient being monitored Patient Re-evaluated:Patient Re-evaluated prior to inductionOxygen Delivery Method: Circle System Utilized Preoxygenation: Pre-oxygenation with 100% oxygen Intubation Type: IV induction Ventilation: Mask ventilation without difficulty LMA: LMA inserted LMA Size: 5.0 Number of attempts: 1 Airway Equipment and Method: bite block Placement Confirmation: positive ETCO2 Tube secured with: Tape Dental Injury: Teeth and Oropharynx as per pre-operative assessment    Anesthesia Regional Block:  Supraclavicular block  Pre-Anesthetic Checklist: ,, timeout performed, Correct Patient, Correct Site, Correct Laterality, Correct Procedure, Correct Position, site marked, Risks and benefits discussed,  Surgical consent,  Pre-op evaluation,  At surgeon's request and post-op pain management  Laterality: Right  Prep: chloraprep       Needles:   Needle Type: Echogenic Stimulator Needle     Needle Length: 9cm 9 cm Needle Gauge: 22 and 22 G    Additional Needles:  Procedures: ultrasound guided (picture in chart) Supraclavicular block Narrative:  Start time: 11/16/2014 12:20 PM End time: 11/16/2014 12:25 PM Injection made incrementally with aspirations every 5 mL.  Performed by: Personally   Additional Notes: 30 cc 0.55 Mepivacaine injected easily

## 2014-11-16 NOTE — Anesthesia Postprocedure Evaluation (Signed)
  Anesthesia Post-op Note  Patient: Clarence Dawson  Procedure(s) Performed: Procedure(s): SUSPENSIONPLASTY RIGHT THUMB TENDON TRANSFER ABDUCTOR POLLICUS LONGUS EXCISION TRAPEZIUM (Right)  Patient Location: PACU  Anesthesia Type:General and GA combined with regional for post-op pain  Level of Consciousness: awake, alert  and oriented  Airway and Oxygen Therapy: Patient Spontanous Breathing  Post-op Pain: none  Post-op Assessment: Post-op Vital signs reviewed, Patient's Cardiovascular Status Stable, Respiratory Function Stable, Patent Airway, No signs of Nausea or vomiting and Pain level controlled  Post-op Vital Signs: stable  Last Vitals:  Filed Vitals:   11/16/14 1545  BP: 154/56  Pulse: 60  Temp: 36.6 C  Resp: 16    Complications: No apparent anesthesia complications

## 2014-11-16 NOTE — H&P (Signed)
Clarence Dawson is 68 years-old, he has been treated for carpal tunnel in the past.    He has had [facility] arthritis on his right side and continues to complain of pain.  This had been injected on two occasions with temporary relief.  He has had a LRTI done on his opposite side 12 years ago in [place]. He is desirous of proceeding to have surgical correction of the [facility] joint of his right thumb performed.  He complains of a constant, moderate to severe, stabbing, aching, burning type pain.  He has been wearing splints, taking nonsteroidal anti-inflammatories.   ALLERGIES:    Voltaren.  MEDICATIONS:   Aspirin, levothyroxine, nateglinide, metformin, metoprolol, Advair, amlodipine, multivitamins, losartan, meloxicam, clonidine, acetaminophen, Xarelto, simvastatin and Invokana.  SURGICAL HISTORY:  Bilateral carpal tunnel release, both eye corneal transplants, cholecystectomy, rotator cuff repair, left knee surgery (partial replacement), LRTI on his left.  FAMILY MEDICAL HISTORY:  Positive for diabetes, heart disease, high blood pressure and arthritis.  SOCIAL HISTORY:  He does not smoke, drinks socially, married and retired.    REVIEW OF SYSTEMS:     Positive for ringing in his ears, high blood pressure, asthma, sleep disorder, otherwise negative 14 points.   Clarence Dawson is an 68 y.o. male.   Chief Complaint: [place] arthritis right thumb HPI: see above  Past Medical History  Diagnosis Date  . Hypertension   . Allergic rhinitis   . Atrial fibrillation   . Syncope   . Sinoatrial node dysfunction   . 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 Hyperlipidemia   . RBBB (right bundle branch block)     Coronary Artherosclerosis  . Tricuspid valve disorder   . Aortic valve disorder   . Carotid artery stenosis   . Diabetes   . Hemorrhage of rectum   . Pacemaker     Oct 2005 in [place].  . Asthma     since childhood- seasonal allergies induced  . Arthritis     ostoearthritis  . Sinoatrial node dysfunction   .  Hypothyroidism   . GERD (gastroesophageal reflux disease)   . H/O hiatal hernia   . OSA (obstructive sleep apnea)     done in [place] 2005- place no longer in business-does not use  . Full dentures     Past Surgical History  Procedure Laterality Date  . Gallbladder surgery  12/01/2006  . Rotator cuff surgery  2001    Right shoulder  . Carpal tunnel release  1994    right wrist  . Arthropathy  2005    Rebuilding of left thumb and joint   . Hemorroidectomy  2003  . Circumcision    . A-v cardiac pacemaker insertion      Sick sinus syndrome DDR pacer  . Cardiac electrophysiology study and ablation  09/2008    for pvcs, Dr. Loralie Champagne  . Left knee arthroscopy      [facility]  . Carpal tunnel release  05/04/2012    Procedure: CARPAL TUNNEL RELEASE;  Surgeon: Wynonia Sours, MD;  Location: [facility];  Service: Orthopedics;  Laterality: Left;  . Cardiac catheterization    . Eye surgery      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-[facility] surgery  2012    Left eye Corneal transplant- partial- Cataract  . Tonsillectomy    . Cholecystectomy  1994  . Partial knee arthroplasty  11/22/2012    Procedure: UNICOMPARTMENTAL KNEE;  Surgeon: Lorn Junes, MD;  Location:  [facility];  Service: Orthopedics;  Laterality: Left;  left unicompartmental knee arthroplasty  . Hand tendon surgery Left late 1990's    thumb  . Colonoscopy N/A 03/14/2013    Procedure: COLONOSCOPY;  Surgeon: Daneil Dolin, MD;  [facility];  Service: Endoscopy;  Laterality: N/A;  8:15 AM    Family History  Problem Relation Age of Onset  . Other Father 25    Sudden Cardiac death  . Pancreatic cancer Mother   . Colon cancer Mother   . Colon polyps Neg Hx   . Colon cancer Maternal Aunt    Social History:  reports that he quit smoking about 30 years ago. He does not have any smokeless tobacco history on file. He reports that he drinks about 1.2 oz of alcohol per week. He reports  that he does not use illicit drugs.  Allergies:  Allergies  Allergen Reactions  . Food Anaphylaxis and Shortness Of Breath    Tree nuts- breathing difficulty, seafood- breathing difficulty  . Iodine Itching and Other (See Comments)    Reaction unknown  . Shellfish Allergy   . Voltaren [Diclofenac Sodium] Other (See Comments)    Feels like things are crawling on him    Medications Prior to Admission  Medication Sig Dispense Refill  . acetaminophen (TYLENOL) 500 MG tablet Take 500 mg by mouth every 6 (six) hours as needed. Pt said he takes 2 500 mg tablets  Every 6-8 hours ( averages 1 gm  Tid) and was told he could do so    . amLODipine (NORVASC) 10 MG tablet Take 10 mg by mouth daily.      Marland Kitchen aspirin 81 MG tablet Take 81 mg by mouth daily.    Marland Kitchen atorvastatin (LIPITOR) 40 MG tablet Take 40 mg by mouth daily.    . cloNIDine (CATAPRES) 0.1 MG tablet Take 0.1 mg by mouth 2 (two) times daily.    . Fluticasone-Salmeterol (ADVAIR DISKUS) 250-50 MCG/DOSE AEPB Inhale 1 puff into the lungs every 12 (twelve) hours.      Marland Kitchen levothyroxine (SYNTHROID, LEVOTHROID) 175 MCG tablet Take 175 mcg by mouth daily.      Marland Kitchen losartan-hydrochlorothiazide (HYZAAR) 100-25 MG per tablet Take 1 tablet by mouth daily.    . meloxicam (MOBIC) 15 MG tablet Take 15 mg by mouth daily.    . metFORMIN (GLUCOPHAGE) 500 MG tablet Take 500 mg by mouth 2 (two) times daily with a meal.     . metoprolol (LOPRESSOR) 100 MG tablet Take 100 mg by mouth 2 (two) times daily.     . Multiple Vitamin (MULTIVITAMIN) tablet Take 1 tablet by mouth daily.      . nateglinide (STARLIX) 120 MG tablet Pt takes one tablet daily before lunch    . ranitidine (ZANTAC) 150 MG tablet Take 150 mg by mouth 2 (two) times daily.    Alveda Reasons 20 MG TABS tablet TAKE 1 TABLET BY MOUTH (20MG  TOTAL) DAILY WITH SUPPER 90 tablet 3    Results for orders placed or performed during the hospital encounter of 11/16/14 (from the past 48 hour(s))  I-STAT, chem 8      Status: Abnormal   Collection Time: 11/16/14 11:23 AM  Result Value Ref Range   Sodium 143 137 - 147 mEq/L   Potassium 4.4 3.7 - 5.3 mEq/L   Chloride 105 96 - 112 mEq/L   BUN 22 6 - 23 mg/dL   Creatinine, Ser 0.90 0.50 - 1.35 mg/dL   Glucose,  Bld 142 (H) 70 - 99 mg/dL   Calcium, Ion 1.23 1.13 - 1.30 mmol/L   TCO2 25 0 - 100 mmol/L   Hemoglobin 17.7 (H) 13.0 - 17.0 g/dL   HCT 52.0 39.0 - 52.0 %    No results found.   Pertinent items are noted in HPI.  Blood pressure 155/80, pulse 61, temperature 98.5 F (36.9 C), temperature source Oral, resp. rate 18, height 5\' 11"  (1.803 m), weight 110.394 kg (243 lb 6 oz), SpO2 98 %.  General appearance: alert, cooperative and appears stated age Head: Normocephalic, without obvious abnormality Neck: no JVD Resp: clear to auscultation bilaterally Cardio: regular rate and rhythm, S1, S2 normal, no murmur, click, rub or gallop GI: soft, non-tender; bowel sounds normal; no masses,  no organomegaly Extremities: pain right thumb Pulses: 2+ and symmetric Skin: Skin color, texture, turgor normal. No rashes or lesions Neurologic: Grossly normal Incision/Wound: na  Assessment/Plan  PLAN:   He would like to proceed to have this surgically intervened.    He is aware there is no guarantee with the surgery, possibility of infection, recurrence, injury to arteries, nerves, tendons, incomplete relief of symptoms and dystrophy.  He is scheduled for suspensionplasty right thumb with excision trapezium and transfer APL.      Dametria Tuzzolino R 11/16/2014, 12:01 PM

## 2014-11-16 NOTE — Transfer of Care (Signed)
Immediate Anesthesia Transfer of Care Note  Patient: Clarence Dawson  Procedure(s) Performed: Procedure(s): SUSPENSIONPLASTY RIGHT THUMB TENDON TRANSFER ABDUCTOR POLLICUS LONGUS EXCISION TRAPEZIUM (Right)  Patient Location: PACU  Anesthesia Type:GA combined with regional for post-op pain  Level of Consciousness: awake, sedated and patient cooperative  Airway & Oxygen Therapy: Patient Spontanous Breathing and Patient connected to face mask oxygen  Post-op Assessment: Report given to PACU RN and Post -op Vital signs reviewed and stable  Post vital signs: Reviewed and stable  Complications: No apparent anesthesia complications

## 2014-11-16 NOTE — Discharge Instructions (Addendum)
[facility] Instructions Hand Surgery  Wound Care: Keep your hand elevated above the level of your heart.  Do not allow it to dangle by your side.  Keep the dressing dry and do not remove it unless your doctor advises you to do so.  He will usually change it at the time of your post-op visit.  Moving your fingers is advised to stimulate circulation but will depend on the site of your surgery.  If you have a splint applied, your doctor will advise you regarding movement.  Activity: Do not drive or operate machinery today.  Rest today and then you may return to your normal activity and work as indicated by your physician.  Diet:  Drink liquids today or eat a light diet.  You may resume a regular diet tomorrow.    General expectations: Pain for two to three days. Fingers may become slightly swollen.  Call your doctor if any of the following occur: Severe pain not relieved by pain medication. Elevated temperature. Dressing soaked with blood. Inability to move fingers. White or bluish color to fingers.    Post Anesthesia Home Care Instructions  Activity: Get plenty of rest for the remainder of the day. A responsible adult should stay with you for 24 hours following the procedure.  For the next 24 hours, DO NOT: -Drive a car -Paediatric nurse -Drink alcoholic beverages -Take any medication unless instructed by your physician -Make any legal decisions or sign important papers.  Meals: Start with liquid foods such as gelatin or soup. Progress to regular foods as tolerated. Avoid greasy, spicy, heavy foods. If nausea and/or vomiting occur, drink only clear liquids until the nausea and/or vomiting subsides. Call your physician if vomiting continues.  Special Instructions/Symptoms: Your throat may feel dry or sore from the anesthesia or the breathing tube placed in your throat during surgery. If this causes discomfort, gargle with warm salt water. The discomfort should disappear within  24 hours.   Regional Anesthesia Blocks  1. Numbness or the inability to move the "blocked" extremity may last from 3-48 hours after placement. The length of time depends on the medication injected and your individual response to the medication. If the numbness is not going away after 48 hours, call your surgeon.  2. The extremity that is blocked will need to be protected until the numbness is gone and the  Strength has returned. Because you cannot feel it, you will need to take extra care to avoid injury. Because it may be weak, you may have difficulty moving it or using it. You may not know what position it is in without looking at it while the block is in effect.  3. For blocks in the legs and feet, returning to weight bearing and walking needs to be done carefully. You will need to wait until the numbness is entirely gone and the strength has returned. You should be able to move your leg and foot normally before you try and bear weight or walk. You will need someone to be with you when you first try to ensure you do not fall and possibly risk injury.  4. Bruising and tenderness at the needle site are common side effects and will resolve in a few days.  5. Persistent numbness or new problems with movement should be communicated to the surgeon or the [facility] [phone] [place] [phone]).

## 2014-11-16 NOTE — Anesthesia Preprocedure Evaluation (Signed)
Anesthesia Evaluation  Patient identified by MRN, date of birth, ID band Patient awake    Reviewed: Allergy & Precautions, H&P , NPO status , Patient's Chart, lab work & pertinent test results  Airway Mallampati: II  TM Distance: >3 FB Neck ROM: Full    Dental  (+) Teeth Intact, Dental Advisory Given   Pulmonary former smoker,  breath sounds clear to auscultation        Cardiovascular hypertension, Rhythm:Irregular Rate:Normal     Neuro/Psych    GI/Hepatic   Endo/Other  diabetes  Renal/GU      Musculoskeletal   Abdominal (+) + obese,   Peds  Hematology   Anesthesia Other Findings   Reproductive/Obstetrics                             Anesthesia Physical Anesthesia Plan  ASA: III  Anesthesia Plan: General   Post-op Pain Management:    Induction: Intravenous  Airway Management Planned: LMA  Additional Equipment:   Intra-op Plan:   Post-operative Plan:   Informed Consent: I have reviewed the patients History and Physical, chart, labs and discussed the procedure including the risks, benefits and alternatives for the proposed anesthesia with the patient or authorized representative who has indicated his/her understanding and acceptance.   Dental advisory given  Plan Discussed with: CRNA and Anesthesiologist  Anesthesia Plan Comments:         Anesthesia Quick Evaluation

## 2014-11-16 NOTE — Brief Op Note (Signed)
11/16/2014  2:51 PM  PATIENT:  [name]  68 y.o. 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  PROCEDURE:  Procedure(s): SUSPENSIONPLASTY RIGHT THUMB TENDON TRANSFER ABDUCTOR POLLICUS LONGUS EXCISION TRAPEZIUM (Right)  SURGEON:  Surgeon(s) and Role:    * Daryll Brod, MD - Primary  PHYSICIAN ASSISTANT:   ASSISTANTS: R Dasnoit, PAC   ANESTHESIA:   regional and IV sedation  EBL:  Total I/O In: 650 [I.V.:650] Out: -   BLOOD ADMINISTERED:none  DRAINS: none   LOCAL MEDICATIONS USED:  NONE  SPECIMEN:  No Specimen  DISPOSITION OF SPECIMEN:  N/A  COUNTS:  YES  TOURNIQUET:   Total Tourniquet Time Documented: Upper Arm (Right) - 65 minutes Total: Upper Arm (Right) - 65 minutes   DICTATION: .Other Dictation: Dictation Number [id number]  PLAN OF CARE: Discharge to home after PACU  PATIENT DISPOSITION:  PACU - hemodynamically stable.

## 2014-11-16 NOTE — Op Note (Signed)
NAME:  Clarence Dawson, Clarence Dawson NO.:  [id number]  MEDICAL RECORD NO.:  [id number]  LOCATION:                                 FACILITY:  PHYSICIAN:  Daryll Brod, M.D.            DATE OF BIRTH:  DATE OF PROCEDURE:  11/16/2014 DATE OF DISCHARGE:                              OPERATIVE REPORT   PREOPERATIVE DIAGNOSIS:  Carpometacarpal arthritis, right thumb.  POSTOPERATIVE DIAGNOSIS:  Carpometacarpal arthritis, right thumb.  OPERATION:  Suspension plasty with excision of trapezium, abductor pollicis longus tendon transfer, right wrist.  SURGEON:  Daryll Brod, MD  ASSISTANT:  Marily Lente Dasnoit, PA-C  ANESTHESIA:  Supraclavicular block general.  ANESTHESIOLOGIST:  Glynda Jaeger, MD  HISTORY:  The patient is a 68 year old male with a history of pain at the basilar area of his right thumb. This has not responded to conservative treatment, and he has elected to undergo surgical correction of this.  Pre, peri and postoperative course have been discussed along with risks and complications.  He is aware that there is no guarantee with the surgery, possibility of infection, recurrence of injury to arteries, nerves, tendons, incomplete relief of symptoms, and dystrophy.  In the preoperative area, the pressure was seen.  The extremity marked by both patient and surgeon.  Antibiotic given.  PROCEDURE IN DETAIL:  The patient was brought to the operating room, where a supraclavicular block was carried out in the preoperative area under the direction of Dr. Linna Caprice, general anesthetic in the operating room.  He was prepped using ChloraPrep, supine position, right arm free. A 3-minute dry time was allowed.  Time-out taken, confirming the patient and procedure.  The limb was exsanguinated with an Esmarch bandage. Tourniquet placed high and the arm was inflated to 250 mmHg.  A curvilinear incision was made over the base of the thumb, right hand, carried along the abductor pollicis  longus tendon, carried down through subcutaneous tissue.  Bleeders were electrocauterized with bipolar. Radial sensory nerve identified and protected.  The radial artery identified during dissection deep between the abductor pollicis longus and extensor pollicis brevis.  The capsule was opened distally identifying the [facility] joint carried proximally identifying the STT joint. With blunt and sharp dissection, the trapezium was isolated subperiosteally.  This was removed in pieces.  There was significant arthritic changes present on the carpometacarpal joint and on the STT joint.  The trapezium was excised.  The most dorsal slip of the abductor pollicis longus was then selected.  This was partially harvested through 2 separate incisions proximally, transversely at the musculotendinous junction.  A Carroll tendon retriever was then passed through the first dorsal compartment.  A 30-gauge monofilament wire was then passed around the portion of the abductor pollicis longus that was going to be used as a graft.  This was then brought proximally cutting the abductor pollicis longus tendon.  This was then transected at the musculotendinous junction.  Drill holes were placed in the base of the first metacarpal from a dorsal to palmar direction for placement of the graft up through the base of the index metacarpal.  A drill hole was placed through the  index metacarpal from volar to dorsal direction.  An incision made on the dorsal aspect of the hand as it exited.  The tendon was then passed through each one of the drill holes in a distal to proximal direction through the base of the first metacarpal and from a volar to dorsal direction through the second metacarpal.  The wounds for the harvesting of the graft were then closed with interrupted 4-0 Vicryl Rapide sutures after irrigation.  A hemostat was then used to create a tunnel along the base of the index metacarpal up proximal to the extensor carpi  radialis longus.  The tendon graft was then brought around the dorsal aspect of the base of the index metacarpal back into the defect of the trapezium. This was then brought around the abductor pollicis longus as it entered into the second metacarpal where it was sutured to itself.  The remainder of the tendon was then brought to the remainder of the abductor pollicis longus insertion and sutured.  This was done with figure-of-eight 4-0 FiberWire sutures.  This firmly fixed the abductor pollicis longus at the base of the thumb metacarpal.  There was no proximal translation of the thumb on the index towards the distal pole of the scaphoid.  The wound was copiously irrigated with saline.  The capsule closed with interrupted 4-0 Vicryl sutures, subcutaneous tissue with interrupted 4-0 Vicryl, and the skin with interrupted 4-0 Vicryl Rapide sutures.  A sterile compressive dressing, dorsal palmar thumb spica splint then applied.  On deflation of the tourniquet, all fingers immediately pinked.  He was taken to the recovery room for observation in satisfactory condition.  He will be discharged home to return to the [facility] in 1 week on Percocet.          ______________________________ Daryll Brod, M.D.     GK/MEDQ  D:  11/16/2014  T:  11/16/2014  Job:  [id number]

## 2014-11-16 NOTE — Progress Notes (Signed)
Assisted Dr. Linna Caprice with right, ultrasound guided, interscalene  block. Side rails up, monitors on throughout procedure. See vital signs in flow sheet. Tolerated Procedure well.

## 2014-11-17 ENCOUNTER — Encounter (HOSPITAL_BASED_OUTPATIENT_CLINIC_OR_DEPARTMENT_OTHER): Payer: Self-pay | Admitting: Orthopedic Surgery

## 2014-11-22 DIAGNOSIS — M1811 Unilateral primary osteoarthritis of first carpometacarpal joint, right hand: Secondary | ICD-10-CM | POA: Diagnosis not present

## 2014-11-27 DIAGNOSIS — Z947 Corneal transplant status: Secondary | ICD-10-CM | POA: Diagnosis not present

## 2014-11-27 DIAGNOSIS — H43812 Vitreous degeneration, left eye: Secondary | ICD-10-CM | POA: Diagnosis not present

## 2014-11-27 DIAGNOSIS — Z9889 Other specified postprocedural states: Secondary | ICD-10-CM | POA: Diagnosis not present

## 2014-11-27 DIAGNOSIS — Z961 Presence of intraocular lens: Secondary | ICD-10-CM | POA: Diagnosis not present

## 2014-11-29 ENCOUNTER — Encounter: Payer: Self-pay | Admitting: Internal Medicine

## 2014-11-29 DIAGNOSIS — M1811 Unilateral primary osteoarthritis of first carpometacarpal joint, right hand: Secondary | ICD-10-CM | POA: Diagnosis not present

## 2014-12-04 DIAGNOSIS — E119 Type 2 diabetes mellitus without complications: Secondary | ICD-10-CM | POA: Diagnosis not present

## 2014-12-04 DIAGNOSIS — Z125 Encounter for screening for malignant neoplasm of prostate: Secondary | ICD-10-CM | POA: Diagnosis not present

## 2014-12-07 ENCOUNTER — Encounter (HOSPITAL_COMMUNITY): Payer: Self-pay | Admitting: Internal Medicine

## 2014-12-07 DIAGNOSIS — L219 Seborrheic dermatitis, unspecified: Secondary | ICD-10-CM | POA: Diagnosis not present

## 2014-12-07 DIAGNOSIS — L570 Actinic keratosis: Secondary | ICD-10-CM | POA: Diagnosis not present

## 2014-12-07 DIAGNOSIS — Z08 Encounter for follow-up examination after completed treatment for malignant neoplasm: Secondary | ICD-10-CM | POA: Diagnosis not present

## 2014-12-07 DIAGNOSIS — Z85828 Personal history of other malignant neoplasm of skin: Secondary | ICD-10-CM | POA: Diagnosis not present

## 2014-12-07 DIAGNOSIS — D225 Melanocytic nevi of trunk: Secondary | ICD-10-CM | POA: Diagnosis not present

## 2014-12-07 DIAGNOSIS — X32XXXD Exposure to sunlight, subsequent encounter: Secondary | ICD-10-CM | POA: Diagnosis not present

## 2014-12-11 DIAGNOSIS — E1129 Type 2 diabetes mellitus with other diabetic kidney complication: Secondary | ICD-10-CM | POA: Diagnosis not present

## 2014-12-11 DIAGNOSIS — I4891 Unspecified atrial fibrillation: Secondary | ICD-10-CM | POA: Diagnosis not present

## 2014-12-11 DIAGNOSIS — I10 Essential (primary) hypertension: Secondary | ICD-10-CM | POA: Diagnosis not present

## 2014-12-25 DIAGNOSIS — M722 Plantar fascial fibromatosis: Secondary | ICD-10-CM | POA: Diagnosis not present

## 2014-12-25 DIAGNOSIS — M2141 Flat foot [pes planus] (acquired), right foot: Secondary | ICD-10-CM | POA: Diagnosis not present

## 2014-12-25 DIAGNOSIS — E1165 Type 2 diabetes mellitus with hyperglycemia: Secondary | ICD-10-CM | POA: Diagnosis not present

## 2014-12-25 DIAGNOSIS — B351 Tinea unguium: Secondary | ICD-10-CM | POA: Diagnosis not present

## 2014-12-25 DIAGNOSIS — M79671 Pain in right foot: Secondary | ICD-10-CM | POA: Diagnosis not present

## 2015-01-18 DIAGNOSIS — J209 Acute bronchitis, unspecified: Secondary | ICD-10-CM | POA: Diagnosis not present

## 2015-01-22 DIAGNOSIS — M79671 Pain in right foot: Secondary | ICD-10-CM | POA: Diagnosis not present

## 2015-01-22 DIAGNOSIS — E1165 Type 2 diabetes mellitus with hyperglycemia: Secondary | ICD-10-CM | POA: Diagnosis not present

## 2015-01-22 DIAGNOSIS — M722 Plantar fascial fibromatosis: Secondary | ICD-10-CM | POA: Diagnosis not present

## 2015-01-22 DIAGNOSIS — M2141 Flat foot [pes planus] (acquired), right foot: Secondary | ICD-10-CM | POA: Diagnosis not present

## 2015-01-23 DIAGNOSIS — M6281 Muscle weakness (generalized): Secondary | ICD-10-CM | POA: Diagnosis not present

## 2015-01-23 DIAGNOSIS — M1811 Unilateral primary osteoarthritis of first carpometacarpal joint, right hand: Secondary | ICD-10-CM | POA: Diagnosis not present

## 2015-01-23 DIAGNOSIS — M79641 Pain in right hand: Secondary | ICD-10-CM | POA: Diagnosis not present

## 2015-02-16 ENCOUNTER — Encounter: Payer: Self-pay | Admitting: Internal Medicine

## 2015-02-16 ENCOUNTER — Ambulatory Visit (INDEPENDENT_AMBULATORY_CARE_PROVIDER_SITE_OTHER): Payer: Medicare Other | Admitting: Internal Medicine

## 2015-02-16 VITALS — BP 122/76 | HR 83 | Ht 70.0 in | Wt 240.0 lb

## 2015-02-16 DIAGNOSIS — M1990 Unspecified osteoarthritis, unspecified site: Secondary | ICD-10-CM | POA: Diagnosis not present

## 2015-02-16 DIAGNOSIS — I495 Sick sinus syndrome: Secondary | ICD-10-CM

## 2015-02-16 DIAGNOSIS — I10 Essential (primary) hypertension: Secondary | ICD-10-CM

## 2015-02-16 DIAGNOSIS — I493 Ventricular premature depolarization: Secondary | ICD-10-CM

## 2015-02-16 DIAGNOSIS — Z950 Presence of cardiac pacemaker: Secondary | ICD-10-CM | POA: Diagnosis not present

## 2015-02-16 DIAGNOSIS — I480 Paroxysmal atrial fibrillation: Secondary | ICD-10-CM | POA: Diagnosis not present

## 2015-02-16 LAB — MDC_IDC_ENUM_SESS_TYPE_INCLINIC
Battery Remaining Longevity: 120 mo
Brady Statistic RV Percent Paced: 2.7 %
Implantable Pulse Generator Model: 2210
Implantable Pulse Generator Serial Number: 7439597
Lead Channel Impedance Value: 387.5 Ohm
Lead Channel Impedance Value: 650 Ohm
Lead Channel Pacing Threshold Amplitude: 0.75 V
Lead Channel Pacing Threshold Amplitude: 0.875 V
Lead Channel Pacing Threshold Pulse Width: 0.4 ms
Lead Channel Pacing Threshold Pulse Width: 0.4 ms
Lead Channel Pacing Threshold Pulse Width: 0.4 ms
Lead Channel Sensing Intrinsic Amplitude: 12 mV
Lead Channel Sensing Intrinsic Amplitude: 2.7 mV
Lead Channel Setting Pacing Amplitude: 2 V
Lead Channel Setting Pacing Pulse Width: 0.4 ms
Lead Channel Setting Sensing Sensitivity: 2 mV
MDC IDC MSMT BATTERY VOLTAGE: 2.99 V
MDC IDC MSMT LEADCHNL RA PACING THRESHOLD AMPLITUDE: 0.75 V
MDC IDC SESS DTM: 20160219095122
MDC IDC SET LEADCHNL RV PACING AMPLITUDE: 1.125
MDC IDC STAT BRADY RA PERCENT PACED: 83 %

## 2015-02-16 LAB — PACEMAKER DEVICE OBSERVATION

## 2015-02-16 MED ORDER — RIVAROXABAN 20 MG PO TABS: ORAL_TABLET | ORAL | Status: DC

## 2015-02-16 NOTE — Patient Instructions (Addendum)
Your physician wants you to follow-up in: 1 year with Dr. Lovena Le. You will receive a reminder letter in the mail two months in advance. If you don't receive a letter, please call our office to schedule the follow-up appointment.  Remote monitoring is used to monitor your Pacemaker of ICD from home. This monitoring reduces the number of office visits required to check your device to one time per year. It allows [place] to keep an eye on the functioning of your device to ensure it is working properly. You are scheduled for a device check from home on 05/21/15. You may send your transmission at any time that day. If you have a wireless device, the transmission will be sent automatically. After your physician reviews your transmission, you will receive a postcard with your next transmission date.  Stop Aspirin  Thank you for choosing [facility]!

## 2015-02-16 NOTE — Assessment & Plan Note (Signed)
St[employer] Jude DDD PM is working normally. Will recheck in several months.

## 2015-02-16 NOTE — Assessment & Plan Note (Signed)
His blood pressure is well controlled today. No change in meds.

## 2015-02-16 NOTE — Progress Notes (Signed)
HPI Mr. Bunte returns today for followup. He is a pleasant 69 yo man with a h/o symptomatic bradycardia due to sinus node dysfunction, s/p PPM. He underwent generator change over 2 years ago. He has done well in the interim. No chest pain or sob. No syncope. He admits to sodium indiscretion. He does not experience palpitations. He has undergone hand surgery and drainage of his left knee. He appears to have bled into it. He has questions about his anti-coagulation. Also about arthritis meds. Allergies  Allergen Reactions  . Food Anaphylaxis and Shortness Of Breath    Tree nuts- breathing difficulty, seafood- breathing difficulty  . Iodine Itching and Other (See Comments)    Reaction unknown  . Shellfish Allergy   . Voltaren [Diclofenac Sodium] Other (See Comments)    Feels like things are crawling on him     Current Outpatient Prescriptions  Medication Sig Dispense Refill  . acetaminophen (TYLENOL) 500 MG tablet Take 500 mg by mouth every 6 (six) hours as needed. Pt said he takes 2 500 mg tablets  Every 6-8 hours ( averages 1 gm  Tid) and was told he could do so    . amLODipine (NORVASC) 10 MG tablet Take 10 mg by mouth daily.      Marland Kitchen aspirin 81 MG tablet Take 81 mg by mouth daily.    . cloNIDine (CATAPRES) 0.1 MG tablet Take 0.1 mg by mouth 2 (two) times daily.    . Fluticasone-Salmeterol (ADVAIR DISKUS) 250-50 MCG/DOSE AEPB Inhale 1 puff into the lungs every 12 (twelve) hours.      Marland Kitchen levothyroxine (SYNTHROID, LEVOTHROID) 175 MCG tablet Take 175 mcg by mouth daily.      Marland Kitchen losartan-hydrochlorothiazide (HYZAAR) 100-25 MG per tablet Take 1 tablet by mouth daily.    . metFORMIN (GLUCOPHAGE) 500 MG tablet Take 500 mg by mouth 2 (two) times daily with a meal.     . metoprolol (LOPRESSOR) 100 MG tablet Take 100 mg by mouth 2 (two) times daily.     . Multiple Vitamin (MULTIVITAMIN) tablet Take 1 tablet by mouth daily.      . nateglinide (STARLIX) 120 MG tablet Pt takes one tablet daily before lunch     . ranitidine (ZANTAC) 150 MG tablet Take 150 mg by mouth 2 (two) times daily.    . simvastatin (ZOCOR) 20 MG tablet Take 20 mg by mouth at bedtime.    Alveda Reasons 20 MG TABS tablet TAKE 1 TABLET BY MOUTH (20MG  TOTAL) DAILY WITH SUPPER 90 tablet 3   No current facility-administered medications for this visit.     Past Medical History  Diagnosis Date  . Hypertension   . Allergic rhinitis   . Atrial fibrillation   . Syncope   . Sinoatrial node dysfunction   . 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 Hyperlipidemia   . RBBB (right bundle branch block)     Coronary Artherosclerosis  . Tricuspid valve disorder   . Aortic valve disorder   . Carotid artery stenosis   . Diabetes   . Hemorrhage of rectum   . Pacemaker     Oct 2005 in [place].  . Asthma     since childhood- seasonal allergies induced  . Arthritis     ostoearthritis  . Sinoatrial node dysfunction   . Hypothyroidism   . GERD (gastroesophageal reflux disease)   . H/O hiatal hernia   . OSA (obstructive sleep apnea)     done in [place] 2005- place no longer in business-does  not use  . Full dentures     ROS:   All systems reviewed and negative except as noted in the HPI.   Past Surgical History  Procedure Laterality Date  . Gallbladder surgery  12/01/2006  . Rotator cuff surgery  2001    Right shoulder  . Carpal tunnel release  1994    right wrist  . Arthropathy  2005    Rebuilding of left thumb and joint   . Hemorroidectomy  2003  . Circumcision    . A-v cardiac pacemaker insertion      Sick sinus syndrome DDR pacer  . Cardiac electrophysiology study and ablation  09/2008    for pvcs, Dr. Loralie Champagne  . Left knee arthroscopy      [facility]  . Carpal tunnel release  05/04/2012    Procedure: CARPAL TUNNEL RELEASE;  Surgeon: Wynonia Sours, MD;  Location: [facility];  Service: Orthopedics;  Laterality: Left;  . Cardiac catheterization    . 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 Tonsillectomy    . Cholecystectomy  1994  . Partial knee arthroplasty  11/22/2012    Procedure: UNICOMPARTMENTAL KNEE;  Surgeon: Lorn Junes, MD;  Location: [facility];  Service: Orthopedics;  Laterality: Left;  left unicompartmental knee arthroplasty  . Hand tendon surgery Left late 1990's    thumb  . Colonoscopy N/A 03/14/2013    Procedure: COLONOSCOPY;  Surgeon: Daneil Dolin, MD;  [facility];  Service: Endoscopy;  Laterality: N/A;  8:15 AM  . Carpometacarpel suspension plasty Right 11/16/2014    Procedure: SUSPENSIONPLASTY RIGHT THUMB TENDON TRANSFER ABDUCTOR POLLICUS LONGUS EXCISION TRAPEZIUM;  Surgeon: Daryll Brod, MD;  Location: [facility];  Service: Orthopedics;  Laterality: Right;  . Permanent pacemaker generator change N/A 01/12/2013    Procedure: PERMANENT PACEMAKER GENERATOR CHANGE;  Surgeon: Evans Lance, MD;  Location: [facility] C[facility];  Service: Cardiovascular;  Laterality: N/A;     Family History  Problem Relation Age of Onset  . Other Father 53    Sudden Cardiac death  . Pancreatic cancer Mother   . Colon cancer Mother   . Colon polyps Neg Hx   . Colon cancer Maternal Aunt      History   Social History  . Marital Status: Married    Spouse Name: N/A  . Number of Children: N/A  . Years of Education: N/A   Occupational History  . Not on file.   Social History Main Topics  . Smoking status: Former Smoker -- 1.00 packs/day for 25 years    Types: Cigarettes    Start date: 02/20/1958    Quit date: 02/12/1984  . Smokeless tobacco: Never Used  . Alcohol Use: 1.2 oz/week    1 Glasses of wine, 1 Cans of beer per week     Comment: 1 can of beer or glasse of wine daily  . Drug Use: No  . Sexual Activity: Not on file   Other Topics Concern  . Not on file   Social History Narrative   Regular exercise: No     BP 122/76 mmHg  Pulse 83  Ht 5\' 10"  (1.778  m)  Wt 240 lb (108.863 kg)  BMI 34.44 kg/m2  SpO2 94%  Physical Exam:  Well appearing 68 yo man, NAD HEENT: Unremarkable Neck:  7 cm JVD, no thyromegally Lungs:  Clear with no wheezes, well healed PPM incision. HEART:  Regular rate rhythm, no murmurs, no rubs, no clicks Abd:  soft, positive bowel sounds, no organomegally, no rebound, no guarding Ext:  2 plus pulses, no edema, no cyanosis, no clubbing Skin:  No rashes no nodules Neuro:  CN II through XII intact, motor grossly intact  DEVICE  Normal device function.  See PaceArt for details.   Assess/Plan:

## 2015-02-16 NOTE — Assessment & Plan Note (Signed)
This is his biggest problem. He had been on meloxicam and this was stopped after he was placed on Xarelto. He states that his quality of life is poor off of Meloxicam. He is taking 3 grams of tylenol daily. He was started on low dose meloxicam and he want to know if this is ok. I am not sure. I have stopped his low dose ASA as he does not have any documented CAD and never had an MI or stent. I will defer continued Meloxicam to his primary MD.

## 2015-02-16 NOTE — Assessment & Plan Note (Signed)
He is in atrial fib approx. 5% of the time. No change in heart meds or Xarelto.

## 2015-02-19 ENCOUNTER — Encounter: Payer: Self-pay | Admitting: Internal Medicine

## 2015-03-15 DIAGNOSIS — M1000 Idiopathic gout, unspecified site: Secondary | ICD-10-CM | POA: Diagnosis not present

## 2015-03-15 DIAGNOSIS — M1812 Unilateral primary osteoarthritis of first carpometacarpal joint, left hand: Secondary | ICD-10-CM | POA: Diagnosis not present

## 2015-03-15 DIAGNOSIS — M65242 Calcific tendinitis, left hand: Secondary | ICD-10-CM | POA: Diagnosis not present

## 2015-03-16 DIAGNOSIS — M1812 Unilateral primary osteoarthritis of first carpometacarpal joint, left hand: Secondary | ICD-10-CM | POA: Diagnosis not present

## 2015-03-16 DIAGNOSIS — M65242 Calcific tendinitis, left hand: Secondary | ICD-10-CM | POA: Diagnosis not present

## 2015-03-19 DIAGNOSIS — M2141 Flat foot [pes planus] (acquired), right foot: Secondary | ICD-10-CM | POA: Diagnosis not present

## 2015-03-19 DIAGNOSIS — B351 Tinea unguium: Secondary | ICD-10-CM | POA: Diagnosis not present

## 2015-03-19 DIAGNOSIS — E1165 Type 2 diabetes mellitus with hyperglycemia: Secondary | ICD-10-CM | POA: Diagnosis not present

## 2015-03-21 DIAGNOSIS — M65242 Calcific tendinitis, left hand: Secondary | ICD-10-CM | POA: Diagnosis not present

## 2015-03-21 DIAGNOSIS — M1812 Unilateral primary osteoarthritis of first carpometacarpal joint, left hand: Secondary | ICD-10-CM | POA: Diagnosis not present

## 2015-04-10 DIAGNOSIS — I10 Essential (primary) hypertension: Secondary | ICD-10-CM | POA: Diagnosis not present

## 2015-04-10 DIAGNOSIS — E119 Type 2 diabetes mellitus without complications: Secondary | ICD-10-CM | POA: Diagnosis not present

## 2015-04-10 DIAGNOSIS — E785 Hyperlipidemia, unspecified: Secondary | ICD-10-CM | POA: Diagnosis not present

## 2015-04-16 DIAGNOSIS — M1812 Unilateral primary osteoarthritis of first carpometacarpal joint, left hand: Secondary | ICD-10-CM | POA: Diagnosis not present

## 2015-04-19 DIAGNOSIS — E1129 Type 2 diabetes mellitus with other diabetic kidney complication: Secondary | ICD-10-CM | POA: Diagnosis not present

## 2015-04-19 DIAGNOSIS — J019 Acute sinusitis, unspecified: Secondary | ICD-10-CM | POA: Diagnosis not present

## 2015-05-11 DIAGNOSIS — R197 Diarrhea, unspecified: Secondary | ICD-10-CM | POA: Diagnosis not present

## 2015-05-11 DIAGNOSIS — R05 Cough: Secondary | ICD-10-CM | POA: Diagnosis not present

## 2015-05-21 ENCOUNTER — Ambulatory Visit (INDEPENDENT_AMBULATORY_CARE_PROVIDER_SITE_OTHER): Payer: Medicare Other | Admitting: *Deleted

## 2015-05-21 ENCOUNTER — Telehealth: Payer: Self-pay | Admitting: Cardiology

## 2015-05-21 DIAGNOSIS — I495 Sick sinus syndrome: Secondary | ICD-10-CM | POA: Diagnosis not present

## 2015-05-21 LAB — CUP PACEART REMOTE DEVICE CHECK
Brady Statistic RA Percent Paced: 90 %
Brady Statistic RV Percent Paced: 2 %
Lead Channel Pacing Threshold Amplitude: 0.75 V
Lead Channel Pacing Threshold Pulse Width: 0.4 ms
Lead Channel Sensing Intrinsic Amplitude: 3.5 mV
Lead Channel Sensing Intrinsic Amplitude: 9.3 mV
Lead Channel Setting Pacing Amplitude: 2 V
MDC IDC MSMT LEADCHNL RA IMPEDANCE VALUE: 390 Ohm
MDC IDC MSMT LEADCHNL RV IMPEDANCE VALUE: 650 Ohm
MDC IDC SESS DTM: 20160523172056
MDC IDC SET LEADCHNL RV PACING AMPLITUDE: 1.125
MDC IDC SET LEADCHNL RV PACING PULSEWIDTH: 0.4 ms
MDC IDC SET LEADCHNL RV SENSING SENSITIVITY: 2 mV
Pulse Gen Model: 2210
Pulse Gen Serial Number: 7439597

## 2015-05-21 NOTE — Telephone Encounter (Signed)
Spoke with pt and reminded pt of remote transmission that is due today. Pt verbalized understanding.   

## 2015-05-21 NOTE — Progress Notes (Signed)
Remote pacemaker transmission.   

## 2015-05-30 ENCOUNTER — Encounter: Payer: Self-pay | Admitting: Cardiology

## 2015-06-07 ENCOUNTER — Encounter: Payer: Self-pay | Admitting: Internal Medicine

## 2015-06-07 DIAGNOSIS — L570 Actinic keratosis: Secondary | ICD-10-CM | POA: Diagnosis not present

## 2015-06-07 DIAGNOSIS — L219 Seborrheic dermatitis, unspecified: Secondary | ICD-10-CM | POA: Diagnosis not present

## 2015-06-07 DIAGNOSIS — D225 Melanocytic nevi of trunk: Secondary | ICD-10-CM | POA: Diagnosis not present

## 2015-06-07 DIAGNOSIS — X32XXXD Exposure to sunlight, subsequent encounter: Secondary | ICD-10-CM | POA: Diagnosis not present

## 2015-08-16 DIAGNOSIS — Z79899 Other long term (current) drug therapy: Secondary | ICD-10-CM | POA: Diagnosis not present

## 2015-08-16 DIAGNOSIS — E119 Type 2 diabetes mellitus without complications: Secondary | ICD-10-CM | POA: Diagnosis not present

## 2015-08-16 DIAGNOSIS — E039 Hypothyroidism, unspecified: Secondary | ICD-10-CM | POA: Diagnosis not present

## 2015-08-20 ENCOUNTER — Ambulatory Visit (INDEPENDENT_AMBULATORY_CARE_PROVIDER_SITE_OTHER): Payer: Medicare Other | Admitting: *Deleted

## 2015-08-20 DIAGNOSIS — I495 Sick sinus syndrome: Secondary | ICD-10-CM | POA: Diagnosis not present

## 2015-08-20 NOTE — Progress Notes (Signed)
Remote pacemaker transmission.   

## 2015-08-23 DIAGNOSIS — E039 Hypothyroidism, unspecified: Secondary | ICD-10-CM | POA: Diagnosis not present

## 2015-08-23 DIAGNOSIS — I10 Essential (primary) hypertension: Secondary | ICD-10-CM | POA: Diagnosis not present

## 2015-08-23 DIAGNOSIS — E1129 Type 2 diabetes mellitus with other diabetic kidney complication: Secondary | ICD-10-CM | POA: Diagnosis not present

## 2015-08-23 DIAGNOSIS — Z23 Encounter for immunization: Secondary | ICD-10-CM | POA: Diagnosis not present

## 2015-08-23 DIAGNOSIS — Z6833 Body mass index (BMI) 33.0-33.9, adult: Secondary | ICD-10-CM | POA: Diagnosis not present

## 2015-08-24 LAB — CUP PACEART REMOTE DEVICE CHECK
Battery Voltage: 2.99 V
Brady Statistic AP VP Percent: 2 %
Brady Statistic AP VS Percent: 90 %
Brady Statistic AS VP Percent: 1 %
Brady Statistic AS VS Percent: 7.1 %
Lead Channel Impedance Value: 660 Ohm
Lead Channel Pacing Threshold Amplitude: 0.75 V
Lead Channel Pacing Threshold Amplitude: 0.75 V
Lead Channel Pacing Threshold Pulse Width: 0.4 ms
Lead Channel Sensing Intrinsic Amplitude: 9.7 mV
Lead Channel Setting Pacing Amplitude: 1 V
Lead Channel Setting Pacing Amplitude: 2 V
Lead Channel Setting Pacing Pulse Width: 0.4 ms
Lead Channel Setting Sensing Sensitivity: 2 mV
MDC IDC MSMT BATTERY REMAINING LONGEVITY: 127 mo
MDC IDC MSMT BATTERY REMAINING PERCENTAGE: 95.5 %
MDC IDC MSMT LEADCHNL RA IMPEDANCE VALUE: 390 Ohm
MDC IDC MSMT LEADCHNL RA PACING THRESHOLD PULSEWIDTH: 0.4 ms
MDC IDC MSMT LEADCHNL RA SENSING INTR AMPL: 3.9 mV
MDC IDC SESS DTM: 20160822063733
MDC IDC STAT BRADY RA PERCENT PACED: 88 %
MDC IDC STAT BRADY RV PERCENT PACED: 2.6 %
Pulse Gen Model: 2210
Pulse Gen Serial Number: 7439597

## 2015-08-30 ENCOUNTER — Encounter: Payer: Self-pay | Admitting: Cardiology

## 2015-09-13 ENCOUNTER — Encounter: Payer: Self-pay | Admitting: Internal Medicine

## 2015-11-05 ENCOUNTER — Other Ambulatory Visit: Payer: Self-pay | Admitting: Internal Medicine

## 2015-11-27 ENCOUNTER — Ambulatory Visit (INDEPENDENT_AMBULATORY_CARE_PROVIDER_SITE_OTHER): Payer: Medicare Other | Admitting: *Deleted

## 2015-11-27 DIAGNOSIS — I495 Sick sinus syndrome: Secondary | ICD-10-CM

## 2015-11-28 NOTE — Progress Notes (Signed)
Remote pacemaker transmission.   

## 2015-12-03 DIAGNOSIS — Z888 Allergy status to other drugs, medicaments and biological substances status: Secondary | ICD-10-CM | POA: Diagnosis not present

## 2015-12-03 DIAGNOSIS — I10 Essential (primary) hypertension: Secondary | ICD-10-CM | POA: Diagnosis not present

## 2015-12-03 DIAGNOSIS — Z947 Corneal transplant status: Secondary | ICD-10-CM | POA: Diagnosis not present

## 2015-12-03 DIAGNOSIS — Z961 Presence of intraocular lens: Secondary | ICD-10-CM | POA: Diagnosis not present

## 2015-12-03 DIAGNOSIS — Z79899 Other long term (current) drug therapy: Secondary | ICD-10-CM | POA: Diagnosis not present

## 2015-12-03 DIAGNOSIS — H5231 Anisometropia: Secondary | ICD-10-CM | POA: Diagnosis not present

## 2015-12-03 DIAGNOSIS — H43812 Vitreous degeneration, left eye: Secondary | ICD-10-CM | POA: Diagnosis not present

## 2015-12-03 DIAGNOSIS — G4733 Obstructive sleep apnea (adult) (pediatric): Secondary | ICD-10-CM | POA: Diagnosis not present

## 2015-12-03 DIAGNOSIS — Z87891 Personal history of nicotine dependence: Secondary | ICD-10-CM | POA: Diagnosis not present

## 2015-12-03 DIAGNOSIS — Z9849 Cataract extraction status, unspecified eye: Secondary | ICD-10-CM | POA: Diagnosis not present

## 2015-12-03 DIAGNOSIS — Z7901 Long term (current) use of anticoagulants: Secondary | ICD-10-CM | POA: Diagnosis not present

## 2015-12-03 DIAGNOSIS — Z9842 Cataract extraction status, left eye: Secondary | ICD-10-CM | POA: Diagnosis not present

## 2015-12-03 DIAGNOSIS — E119 Type 2 diabetes mellitus without complications: Secondary | ICD-10-CM | POA: Diagnosis not present

## 2015-12-03 DIAGNOSIS — Z9841 Cataract extraction status, right eye: Secondary | ICD-10-CM | POA: Diagnosis not present

## 2015-12-03 DIAGNOSIS — E785 Hyperlipidemia, unspecified: Secondary | ICD-10-CM | POA: Diagnosis not present

## 2015-12-03 DIAGNOSIS — H17813 Minor opacity of cornea, bilateral: Secondary | ICD-10-CM | POA: Diagnosis not present

## 2015-12-03 DIAGNOSIS — Z9889 Other specified postprocedural states: Secondary | ICD-10-CM | POA: Diagnosis not present

## 2015-12-03 DIAGNOSIS — Z7982 Long term (current) use of aspirin: Secondary | ICD-10-CM | POA: Diagnosis not present

## 2015-12-03 DIAGNOSIS — Z950 Presence of cardiac pacemaker: Secondary | ICD-10-CM | POA: Diagnosis not present

## 2015-12-03 DIAGNOSIS — Z7984 Long term (current) use of oral hypoglycemic drugs: Secondary | ICD-10-CM | POA: Diagnosis not present

## 2015-12-06 DIAGNOSIS — E039 Hypothyroidism, unspecified: Secondary | ICD-10-CM | POA: Diagnosis not present

## 2015-12-06 DIAGNOSIS — E119 Type 2 diabetes mellitus without complications: Secondary | ICD-10-CM | POA: Diagnosis not present

## 2015-12-06 DIAGNOSIS — Z79899 Other long term (current) drug therapy: Secondary | ICD-10-CM | POA: Diagnosis not present

## 2015-12-13 ENCOUNTER — Ambulatory Visit (HOSPITAL_COMMUNITY)
Admission: RE | Admit: 2015-12-13 | Discharge: 2015-12-13 | Disposition: A | Discharge status: Home / Self Care | Payer: Medicare Other | Source: Ambulatory Visit | Attending: Internal Medicine | Admitting: Internal Medicine

## 2015-12-13 ENCOUNTER — Other Ambulatory Visit (HOSPITAL_COMMUNITY): Payer: Self-pay | Admitting: Internal Medicine

## 2015-12-13 DIAGNOSIS — L570 Actinic keratosis: Secondary | ICD-10-CM | POA: Diagnosis not present

## 2015-12-13 DIAGNOSIS — Z08 Encounter for follow-up examination after completed treatment for malignant neoplasm: Secondary | ICD-10-CM | POA: Diagnosis not present

## 2015-12-13 DIAGNOSIS — R05 Cough: Secondary | ICD-10-CM

## 2015-12-13 DIAGNOSIS — E039 Hypothyroidism, unspecified: Secondary | ICD-10-CM | POA: Diagnosis not present

## 2015-12-13 DIAGNOSIS — M25551 Pain in right hip: Secondary | ICD-10-CM

## 2015-12-13 DIAGNOSIS — M5136 Other intervertebral disc degeneration, lumbar region: Secondary | ICD-10-CM | POA: Insufficient documentation

## 2015-12-13 DIAGNOSIS — R059 Cough, unspecified: Secondary | ICD-10-CM

## 2015-12-13 DIAGNOSIS — X32XXXD Exposure to sunlight, subsequent encounter: Secondary | ICD-10-CM | POA: Diagnosis not present

## 2015-12-13 DIAGNOSIS — D225 Melanocytic nevi of trunk: Secondary | ICD-10-CM | POA: Diagnosis not present

## 2015-12-13 DIAGNOSIS — Z85828 Personal history of other malignant neoplasm of skin: Secondary | ICD-10-CM | POA: Diagnosis not present

## 2015-12-13 DIAGNOSIS — J9811 Atelectasis: Secondary | ICD-10-CM | POA: Diagnosis not present

## 2015-12-13 DIAGNOSIS — M545 Low back pain: Secondary | ICD-10-CM | POA: Diagnosis not present

## 2015-12-13 DIAGNOSIS — Z6834 Body mass index (BMI) 34.0-34.9, adult: Secondary | ICD-10-CM | POA: Diagnosis not present

## 2015-12-13 DIAGNOSIS — Z23 Encounter for immunization: Secondary | ICD-10-CM | POA: Diagnosis not present

## 2015-12-13 DIAGNOSIS — E1129 Type 2 diabetes mellitus with other diabetic kidney complication: Secondary | ICD-10-CM | POA: Diagnosis not present

## 2016-01-10 DIAGNOSIS — M47817 Spondylosis without myelopathy or radiculopathy, lumbosacral region: Secondary | ICD-10-CM | POA: Diagnosis not present

## 2016-01-10 DIAGNOSIS — M546 Pain in thoracic spine: Secondary | ICD-10-CM | POA: Diagnosis not present

## 2016-01-15 ENCOUNTER — Institutional Professional Consult (permissible substitution): Payer: Medicare Other | Admitting: Internal Medicine

## 2016-01-24 ENCOUNTER — Ambulatory Visit (INDEPENDENT_AMBULATORY_CARE_PROVIDER_SITE_OTHER): Payer: Medicare Other | Admitting: Internal Medicine

## 2016-01-24 ENCOUNTER — Encounter: Payer: Self-pay | Admitting: Internal Medicine

## 2016-01-24 VITALS — BP 142/72 | HR 68 | Ht 70.0 in | Wt 237.0 lb

## 2016-01-24 DIAGNOSIS — R058 Other specified cough: Secondary | ICD-10-CM

## 2016-01-24 DIAGNOSIS — R05 Cough: Secondary | ICD-10-CM | POA: Insufficient documentation

## 2016-01-24 DIAGNOSIS — I10 Essential (primary) hypertension: Secondary | ICD-10-CM | POA: Diagnosis not present

## 2016-01-24 MED ORDER — NEBIVOLOL HCL 10 MG PO TABS: ORAL_TABLET | ORAL | Status: DC

## 2016-01-24 MED ORDER — PANTOPRAZOLE SODIUM 40 MG PO TBEC: 40.0000 mg | DELAYED_RELEASE_TABLET | Freq: Every day | ORAL | Status: DC

## 2016-01-24 MED ORDER — FAMOTIDINE 20 MG PO TABS: ORAL_TABLET | ORAL | Status: DC

## 2016-01-24 MED ORDER — PREDNISONE 10 MG PO TABS: ORAL_TABLET | ORAL | Status: DC

## 2016-01-24 MED ORDER — NEBIVOLOL HCL 10 MG PO TABS: 10.0000 mg | ORAL_TABLET | Freq: Every day | ORAL | Status: DC

## 2016-01-24 NOTE — Assessment & Plan Note (Signed)
Changed to bystolic 05/03/3975 due to ? Cough variant asthma  Strongly prefer in this setting: Bystolic, the most beta -1  selective Beta blocker available in sample form, with bisoprolol the most selective generic choice  on the market.   Try samples of bystolic 10 mg bid in place of lopressor

## 2016-01-24 NOTE — Assessment & Plan Note (Addendum)
The most common causes of chronic cough in immunocompetent adults include the following: upper airway cough syndrome (UACS), previously referred to as postnasal drip syndrome (PNDS), which is caused by variety of rhinosinus conditions; (2) asthma; (3) GERD; (4) chronic bronchitis from cigarette smoking or other inhaled environmental irritants; (5) nonasthmatic eosinophilic bronchitis; and (6) bronchiectasis.   These conditions, singly or in combination, have accounted for up to 94% of the causes of chronic cough in prospective studies.   Other conditions have constituted no >6% of the causes in prospective studies These have included bronchogenic carcinoma, chronic interstitial pneumonia, sarcoidosis, left ventricular failure, ACEI-induced cough, and aspiration from a condition associated with pharyngeal dysfunction.    Chronic cough is often simultaneously caused by more than one condition. A single cause has been found from 38 to 82% of the time, multiple causes from 18 to 62%. Multiply caused cough has been the result of three diseases up to 42% of the time.       Based on hx and exam, favor:    Cough variant asthma or more likely   Upper airway cough syndrome, so named because it's frequently impossible to sort out how much is  CR/sinusitis with freq throat clearing (which can be related to primary GERD)   vs  causing  secondary (" extra esophageal")  GERD from wide swings in gastric pressure that occur with throat clearing, often  promoting self use of mint and menthol lozenges that reduce the lower esophageal sphincter tone and exacerbate the problem further in a cyclical fashion.   These are the same pts (now being labeled as having "irritable larynx syndrome" by some cough centers) who not infrequently have a history of having failed to tolerate ace inhibitors,  dry powder inhalers like Advair or biphosphonates or report having atypical reflux symptoms that don't respond to standard doses of PPI  , and are easily confused as having aecopd or asthma flares by even experienced allergists/ pulmonologists.   The first step is to maximize gerd rx and eliminate advair then regroup in 2 weeks - also change lopressor to bystolic in case element of cough variant asthma here  I had an extended discussion with the patient reviewing all relevant studies completed to date and  lasting 35/60 min initial eval.  1) Explained: The standardized cough guidelines published in Chest by Lissa Morales in 2006 are still the best available and consist of a multiple step process (up to 12!) , not a single office visit,  and are intended  to address this problem logically,  with an alogrithm dependent on response to empiric treatment at  each progressive step  to determine a specific diagnosis with  minimal addtional testing needed. Therefore if adherence is an issue or can't be accurately verified,  it's very unlikely the standard evaluation and treatment will be successful here.    Furthermore, response to therapy (other than acute cough suppression, which should only be used short term with avoidance of narcotic containing cough syrups if possible), can be a gradual process for which the patient may perceive immediate benefit.  Unlike going to an eye doctor where the best perscription is almost always the first one and is immediately effective, this is almost never the case in the management of chronic cough syndromes. Therefore the patient needs to commit up front to consistently adhere to recommendations  for up to 6 weeks of therapy directed at the likely underlying problem(s) before the response can be reasonably evaluated.  2) Each maintenance medication was reviewed in detail including most importantly the difference between maintenance and prns and under what circumstances the prns are to be triggered using an action plan format that is not reflected in the computer generated alphabetically organized AVS.     Please see instructions for details which were reviewed in writing and the patient given a copy highlighting the part that I personally wrote and discussed at today's ov.   See instructions for specific recommendations which were reviewed directly with the patient who was given a copy with highlighter outlining the key components.

## 2016-01-24 NOTE — Patient Instructions (Signed)
Stop lopressor and replace with bystolic 10 mg twice daily   Stop advair > Prednisone 10 mg take  4 each am x 2 days,   2 each am x 2 days,  1 each am x 2 days and stop   Pantoprazole (protonix) 40 mg   Take  30-60 min before first meal of the day and Pepcid (famotidine)  20 mg one @  bedtime until return to office - this is the best way to tell whether stomach acid is contributing to your problem.    GERD (REFLUX)  is an extremely common cause of respiratory symptoms just like yours , many times with no obvious heartburn at all.    It can be treated with medication, but also with lifestyle changes including elevation of the head of your bed (ideally with 6 inch  bed blocks),  Smoking cessation, avoidance of late meals, excessive alcohol, and avoid fatty foods, chocolate, peppermint, colas, red wine, and acidic juices such as orange juice.  NO MINT OR MENTHOL PRODUCTS SO NO COUGH DROPS  USE SUGARLESS CANDY INSTEAD (Jolley ranchers or Stover's or Life Savers) or even ice chips will also do - the key is to swallow to prevent all throat clearing. NO OIL BASED VITAMINS - use powdered substitutes.    Please schedule a follow up office visit in 2 weeks, sooner if needed

## 2016-01-24 NOTE — Progress Notes (Signed)
Subjective:    Patient ID: Clarence Dawson, male    DOB: 04/12/1946,     MRN: [id number]  HPI  54 yowm quit smoking 1985 onset of breathing problems mid 2000s rx advair and helped a lot until around spring 2016 with cough varaiably productive not responding to abx x 2 / antiviral and referred to pulmonary clinic 01/24/2016 by Dr Willey Blade for refractory cough.   01/24/2016 1st [facility] office visit/ Chandrika Sandles  maint rx = advair 250 Chief Complaint  Patient presents with  . PULMONARY CONSULT    Referred by Dr. [name]. Pt c/o cough that is sometimes productive with white-brown thick mucus and some wheeze. Pt denies SOB/CP/tightness. Pt does have history of asthma since childhood. Pt is not on maintenance inhalers.   cough is worse before supper until 10 pm goes to sleep and does fine and then recurs p stirs each am   Not limited by breathing from desired activities    No obvious other patterns in day to day or daytime variabilty or assoc  cp or chest tightness, subjective wheeze overt sinus or hb symptoms. No unusual exp hx or h/o childhood pna/ asthma or knowledge of premature birth.  Sleeping ok without nocturnal  or early am exacerbation  of respiratory  c/o's or need for noct saba. Also denies any obvious fluctuation of symptoms with weather or environmental changes or other aggravating or alleviating factors except as outlined above   Current Medications, Allergies, Complete Past Medical History, Past Surgical History, Family History, and Social History were reviewed in [employer] record.               Review of Systems  Constitutional: Negative.  Negative for fever and unexpected weight change.  HENT: Negative.  Negative for congestion, dental problem, ear pain, nosebleeds, postnasal drip, rhinorrhea, sinus pressure, sneezing, sore throat and trouble swallowing.   Eyes: Positive for redness and itching.  Respiratory: Positive for cough and wheezing.  Negative for chest tightness and shortness of breath.   Cardiovascular: Positive for palpitations and leg swelling.  Gastrointestinal: Negative.  Negative for nausea and vomiting.  Endocrine: Negative.   Genitourinary: Negative.  Negative for dysuria.  Musculoskeletal: Positive for joint swelling and arthralgias.  Skin: Negative.  Negative for rash.  Allergic/Immunologic: Positive for environmental allergies and food allergies.  Neurological: Negative.  Negative for headaches.  Hematological: Bruises/bleeds easily.  Psychiatric/Behavioral: Negative.  Negative for dysphoric mood. The patient is not nervous/anxious.        Objective:   Physical Exam  Obese hoarse amb wm nad  Wt Readings from Last 3 Encounters:  01/24/16 237 lb (107.502 kg)  02/16/15 240 lb (108.863 kg)  11/16/14 243 lb 6 oz (110.394 kg)    Vital signs reviewed   HEENT: nl dentition, turbinates, and oropharynx. Nl external ear canals without cough reflex   NECK :  without JVD/Nodes/TM/ nl carotid upstrokes bilaterally   LUNGS: no acc muscle use,  Nl contour chest with minimal insp / exp rhonchi largely upper airway and no cough on insp or exp    CV:  RRR  no s3 or murmur or increase in P2, no edema   ABD:  soft and nontender with nl inspiratory excursion in the supine position. No bruits or organomegaly, bowel sounds nl  MS:  Nl gait/ ext warm without deformities, calf tenderness, cyanosis or clubbing No obvious joint restrictions   SKIN: warm and dry without lesions    NEURO:  alert, approp, nl sensorium with  no motor deficits      I personally reviewed images and agree with radiology impression as follows:  CXR: 12/13/15  Bibasilar atelectasis. My review:  No sign segmental atx        Assessment & Plan:

## 2016-02-12 ENCOUNTER — Encounter: Payer: Self-pay | Admitting: Internal Medicine

## 2016-02-12 ENCOUNTER — Ambulatory Visit (INDEPENDENT_AMBULATORY_CARE_PROVIDER_SITE_OTHER): Payer: Medicare Other | Admitting: Internal Medicine

## 2016-02-12 VITALS — BP 140/82 | HR 72 | Ht 69.0 in | Wt 241.0 lb

## 2016-02-12 DIAGNOSIS — J45991 Cough variant asthma: Secondary | ICD-10-CM | POA: Diagnosis not present

## 2016-02-12 DIAGNOSIS — R05 Cough: Secondary | ICD-10-CM

## 2016-02-12 DIAGNOSIS — J4550 Severe persistent asthma, uncomplicated: Secondary | ICD-10-CM | POA: Insufficient documentation

## 2016-02-12 DIAGNOSIS — I10 Essential (primary) hypertension: Secondary | ICD-10-CM

## 2016-02-12 DIAGNOSIS — R058 Other specified cough: Secondary | ICD-10-CM

## 2016-02-12 MED ORDER — BUDESONIDE-FORMOTEROL FUMARATE 160-4.5 MCG/ACT IN AERO: INHALATION_SPRAY | RESPIRATORY_TRACT | Status: DC

## 2016-02-12 NOTE — Progress Notes (Signed)
Subjective:    Patient ID: Clarence Dawson, male    DOB: March 09, 1946,     MRN: [id number]    Brief patient profile:  48 yowm quit smoking 1985 onset of breathing problems mid 2000s rx advair and helped a lot until around spring 2016 with cough varaiably productive not responding to abx x 2 / antiviral and referred to pulmonary clinic 01/24/2016 by Dr Willey Blade for refractory cough.   Brief patient profile:  01/24/2016 1st [facility] office visit/ Clarence Dawson  maint rx = advair 250 Chief Complaint  Patient presents with  . PULMONARY CONSULT    Referred by Dr. [name]. Pt c/o cough that is sometimes productive with white-brown thick mucus and some wheeze. Pt denies SOB/CP/tightness. Pt does have history of asthma since childhood. Pt is not on maintenance inhalers.   cough is worse before supper until 10 pm goes to sleep and does fine and then recurs p stirs each am  Not limited by breathing from desired activities   rec Stop lopressor and replace with bystolic 10 mg twice daily  Stop advair > Prednisone 10 mg take  4 each am x 2 days,   2 each am x 2 days,  1 each am x 2 days and stop  Pantoprazole (protonix) 40 mg   Take  30-60 min before first meal of the day and Pepcid (famotidine)  20 mg one @  bedtime until return to office - this is the best way to tell whether stomach acid is contributing to your problem.   GERD diet   02/12/2016  f/u ov/Clarence Dawson re:  Cough variant asthma vs uacs  Chief Complaint  Patient presents with  . Cough    Cough is improved since last OV.    able to work out fine s advair    No obvious day to day or daytime variability or assoc excess/ purulent sputum or mucus plugs   or cp or chest tightness, subjective wheeze or overt sinus or hb symptoms. No unusual exp hx or h/o childhood pna/ asthma or knowledge of premature birth.  Sleeping ok without nocturnal  or early am exacerbation  of respiratory  c/o's or need for noct saba. Also denies any obvious fluctuation of  symptoms with weather or environmental changes or other aggravating or alleviating factors except as outlined above   Current Medications, Allergies, Complete Past Medical History, Past Surgical History, Family History, and Social History were reviewed in [employer] record.  ROS  The following are not active complaints unless bolded sore throat, dysphagia, dental problems, itching, sneezing,  nasal congestion or excess/ purulent secretions, ear ache,   fever, chills, sweats, unintended wt loss, classically pleuritic or exertional cp, hemoptysis,  orthopnea pnd or leg swelling, presyncope, palpitations, abdominal pain, anorexia, nausea, vomiting, diarrhea  or change in bowel or bladder habits, change in stools or urine, dysuria,hematuria,  rash, arthralgias, visual complaints, headache, numbness, weakness or ataxia or problems with walking or coordination,  change in mood/affect or memory.                             Objective:   Physical Exam  Obese   amb wm nad/ less hoarse   02/12/2016        241     01/24/16 237 lb (107.502 kg)  02/16/15 240 lb (108.863 kg)  11/16/14 243 lb 6 oz (110.394 kg)    Vital signs reviewed  HEENT: nl dentition, turbinates, and oropharynx. Nl external ear canals without cough reflex   NECK :  without JVD/Nodes/TM/ nl carotid upstrokes bilaterally   LUNGS: no acc muscle use,  Nl contour chest with  End exp wheeze/ cough    CV:  RRR  no s3 or murmur or increase in P2, no edema   ABD:  soft and nontender with nl inspiratory excursion in the supine position. No bruits or organomegaly, bowel sounds nl  MS:  Nl gait/ ext warm without deformities, calf tenderness, cyanosis or clubbing No obvious joint restrictions   SKIN: warm and dry without lesions    NEURO:  alert, approp, nl sensorium with  no motor deficits      I personally reviewed images and agree with radiology impression as follows:  CXR: 12/13/15    Bibasilar atelectasis. My review:  No sign segmental atx        Assessment & Plan:

## 2016-02-12 NOTE — Patient Instructions (Addendum)
Start symbicort 160 Take 2 puffs first thing in am and then another 2 puffs about 12 hours later.   Work on inhaler technique:  relax and gently blow all the way out then take a nice smooth deep breath back in, triggering the inhaler at same time you start breathing in.  Hold for up to 5 seconds if you can. Blow out thru nose. Rinse and gargle with water when done  [facility] to change back to lopressor   Ok to change back to the ranitidine when you finish up the pantoprazole   Please schedule a follow up office visit in 4 weeks, sooner if needed

## 2016-02-13 ENCOUNTER — Encounter: Payer: Self-pay | Admitting: Internal Medicine

## 2016-02-13 NOTE — Assessment & Plan Note (Signed)
Changed to bystolic 01/03/2693 due to ? Cough variant asthma> not really clear better 02/12/2016 so rec resume the lopressor at previous dose

## 2016-02-13 NOTE — Assessment & Plan Note (Signed)
Now the patient is off of Advair and on more selective beta blockers it is clear that there is still an asthmatic component present based on his exam with an expiratory wheeze/cough apparent.  The best choice here would be Symbicort 80/160 and a dose of 2 puffs every 12 hours. The 160 would be better for the asthma but might and exacerbate the upper airway cough component- we have samples of the 160 today  So I  asked him to try these but if he notices worse coughing will need to reduce step down to the 80 dose..  - The proper method of use, as well as anticipated side effects, of a metered-dose inhaler are discussed and demonstrated to the patient. Improved effectiveness after extensive coaching during this visit to a level of approximately 75 % from a baseline of 50 %   I had an extended discussion with the patient reviewing all relevant studies completed to date and  lasting 15 to 20 minutes of a 25 minute visit    Each maintenance medication was reviewed in detail including most importantly the difference between maintenance and prns and under what circumstances the prns are to be triggered using an action plan format that is not reflected in the computer generated alphabetically organized AVS.    Please see instructions for details which were reviewed in writing and the patient given a copy highlighting the part that I personally wrote and discussed at today's ov.

## 2016-02-13 NOTE — Assessment & Plan Note (Addendum)
Try off advair 01/24/2016 and on max gerd rx > cough better, wheeze worse 02/12/2016 > restart ics = symbicort 160 2bid and ok to go back to less aggressive gerd rx unless cough flares   Explained the natural history of uri and why it's necessary in patients at risk to treat GERD aggressively - at least  short term -   to reduce risk of evolving cyclical cough initially  triggered by epithelial injury and a heightened sensitivty to the effects of any upper airway irritants,  most importantly acid - related - then perpetuated by epithelial injury related to the cough itself as the upper airway collapses on itself.  That is, the more sensitive the epithelium becomes once it is damaged by the virus, the more the ensuing irritability> the more the cough, the more the secondary reflux (especially in those prone to reflux) the more the irritation of the sensitive mucosa and so on in a  Classic cyclical pattern.

## 2016-03-07 ENCOUNTER — Encounter: Payer: Self-pay | Admitting: Internal Medicine

## 2016-03-07 ENCOUNTER — Ambulatory Visit (INDEPENDENT_AMBULATORY_CARE_PROVIDER_SITE_OTHER): Payer: Medicare Other | Admitting: Internal Medicine

## 2016-03-07 VITALS — BP 112/58 | HR 92 | Ht 69.0 in | Wt 240.0 lb

## 2016-03-07 DIAGNOSIS — I480 Paroxysmal atrial fibrillation: Secondary | ICD-10-CM | POA: Diagnosis not present

## 2016-03-07 NOTE — Patient Instructions (Addendum)
Your physician wants you to follow-up in: 1 Year with Dr. Lovena Le. You will receive a reminder letter in the mail two months in advance. If you don't receive a letter, please call our office to schedule the follow-up appointment.  Remote monitoring is used to monitor your Pacemaker of ICD from home. This monitoring reduces the number of office visits required to check your device to one time per year. It allows [place] to keep an eye on the functioning of your device to ensure it is working properly. You are scheduled for a device check from home on 06/09/16. You may send your transmission at any time that day. If you have a wireless device, the transmission will be sent automatically. After your physician reviews your transmission, you will receive a postcard with your next transmission date.  Your physician recommends that you continue on your current medications as directed. Please refer to the Current Medication list given to you today.  If you need a refill on your cardiac medications before your next appointment, please call your pharmacy.  Thank you for choosing [facility]!

## 2016-03-07 NOTE — Progress Notes (Signed)
HPI Clarence Dawson returns today for followup. He is a pleasant 70 yo man with a h/o symptomatic bradycardia due to sinus node dysfunction, s/p PPM. He underwent generator change over 3 years ago. No chest pain or sob. No syncope. He admits to sodium indiscretion. He does not experience palpitations. He has lost his wife in the last year after she died suddenly of a massive PE. Allergies  Allergen Reactions  . Food Anaphylaxis and Shortness Of Breath    Tree nuts- breathing difficulty, seafood- breathing difficulty  . Iodine Itching and Other (See Comments)    Reaction unknown  . Shellfish Allergy   . Voltaren [Diclofenac Sodium] Other (See Comments)    Feels like things are crawling on him     Current Outpatient Prescriptions  Medication Sig Dispense Refill  . acetaminophen (TYLENOL) 500 MG tablet Take 500 mg by mouth every 6 (six) hours as needed. Pt said he takes 2 500 mg tablets  Every 6-8 hours ( averages 1 gm  Tid) and was told he could do so    . amLODipine (NORVASC) 10 MG tablet Take 10 mg by mouth daily.      . budesonide-formoterol (SYMBICORT) 160-4.5 MCG/ACT inhaler Take 2 puffs first thing in am and then another 2 puffs about 12 hours later.    . canagliflozin (INVOKANA) 300 MG TABS tablet Take 300 mg by mouth daily before breakfast.    . cloNIDine (CATAPRES) 0.1 MG tablet Take 0.1 mg by mouth 2 (two) times daily.    . famotidine (PEPCID) 20 MG tablet One at bedtime    . levothyroxine (SYNTHROID, LEVOTHROID) 175 MCG tablet Take 175 mcg by mouth daily.      Marland Kitchen losartan-hydrochlorothiazide (HYZAAR) 100-25 MG per tablet Take 1 tablet by mouth daily.    . metFORMIN (GLUCOPHAGE) 500 MG tablet Take 500 mg by mouth daily.     . Multiple Vitamin (MULTIVITAMIN) tablet Take 1 tablet by mouth daily.      . nateglinide (STARLIX) 120 MG tablet Pt takes one tablet daily before lunch    . nebivolol (BYSTOLIC) 10 MG tablet One twice daily    . pantoprazole (PROTONIX) 40 MG tablet Take 1 tablet (40  mg total) by mouth daily. Take 30-60 min before first meal of the day 30 tablet 2  . ranitidine (ZANTAC) 150 MG tablet Take 150 mg by mouth 2 (two) times daily.    . simvastatin (ZOCOR) 20 MG tablet Take 20 mg by mouth at bedtime.    Alveda Reasons 20 MG TABS tablet TAKE 1 TABLET BY MOUTH DAILY WITH SUPPER 90 tablet 1   No current facility-administered medications for this visit.     Past Medical History  Diagnosis Date  . Hypertension   . Allergic rhinitis   . Atrial fibrillation ([place])   . Syncope   . Sinoatrial node dysfunction ([facility])   . 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 Hyperlipidemia   . RBBB (right bundle branch block)     Coronary Artherosclerosis  . Tricuspid valve disorder   . Aortic valve disorder   . Carotid artery stenosis   . Diabetes ([place])   . Hemorrhage of rectum   . Pacemaker     Oct 2005 in [place].  . Asthma     since childhood- seasonal allergies induced  . Arthritis     ostoearthritis  . Sinoatrial node dysfunction ([facility])   . Hypothyroidism   . GERD (gastroesophageal reflux disease)   . H/O hiatal hernia   .  OSA (obstructive sleep apnea)     done in [place] 2005- place no longer in business-does not use  . Full dentures     ROS:   All systems reviewed and negative except as noted in the HPI.   Past Surgical History  Procedure Laterality Date  . Gallbladder surgery  12/01/2006  . Rotator cuff surgery  2001    Right shoulder  . Carpal tunnel release  1994    right wrist  . Arthropathy  2005    Rebuilding of left thumb and joint   . Hemorroidectomy  2003  . Circumcision    . A-v cardiac pacemaker insertion      Sick sinus syndrome DDR pacer  . Cardiac electrophysiology study and ablation  09/2008    for pvcs, Dr. Loralie Champagne  . Left knee arthroscopy      [facility]  . Carpal tunnel release  05/04/2012    Procedure: CARPAL TUNNEL RELEASE;  Surgeon: Wynonia Sours, MD;  Location: [facility];  Service: Orthopedics;  Laterality: Left;   . Cardiac catheterization    . 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 Tonsillectomy    . Cholecystectomy  1994  . Partial knee arthroplasty  11/22/2012    Procedure: UNICOMPARTMENTAL KNEE;  Surgeon: Lorn Junes, MD;  Location: [facility];  Service: Orthopedics;  Laterality: Left;  left unicompartmental knee arthroplasty  . Hand tendon surgery Left late 1990's    thumb  . Colonoscopy N/A 03/14/2013    Procedure: COLONOSCOPY;  Surgeon: Daneil Dolin, MD;  [facility];  Service: Endoscopy;  Laterality: N/A;  8:15 AM  . Carpometacarpel suspension plasty Right 11/16/2014    Procedure: SUSPENSIONPLASTY RIGHT THUMB TENDON TRANSFER ABDUCTOR POLLICUS LONGUS EXCISION TRAPEZIUM;  Surgeon: Daryll Brod, MD;  Location: [facility];  Service: Orthopedics;  Laterality: Right;  . Permanent pacemaker generator change N/A 01/12/2013    Procedure: PERMANENT PACEMAKER GENERATOR CHANGE;  Surgeon: Evans Lance, MD;  Location: [facility] C[facility];  Service: Cardiovascular;  Laterality: N/A;     Family History  Problem Relation Age of Onset  . Other Father 37    Sudden Cardiac death  . Pancreatic cancer Mother   . Colon cancer Mother   . Colon polyps Neg Hx   . Colon cancer Maternal Aunt      Social History   Social History  . Marital Status: Married    Spouse Name: N/A  . Number of Children: N/A  . Years of Education: N/A   Occupational History  . Not on file.   Social History Main Topics  . Smoking status: Former Smoker -- 1.00 packs/day for 25 years    Types: Cigarettes    Start date: 02/20/1958    Quit date: 02/12/1984  . Smokeless tobacco: Never Used  . Alcohol Use: 1.2 oz/week    1 Glasses of wine, 1 Cans of beer per week     Comment: 1 can of beer or glasse of wine daily  . Drug Use: No  . Sexual Activity: Not on file   Other Topics Concern  . Not on file   Social  History Narrative   Regular exercise: No     BP 112/58 mmHg  Pulse 92  Ht '5\' 9"'$  (1.753 m)  Wt 240 lb (108.863 kg)  BMI 35.43  kg/m2  SpO2 95%  Physical Exam:  Well appearing 70 yo man, NAD HEENT: Unremarkable Neck:  7 cm JVD, no thyromegally Lungs:  Clear with no wheezes, well healed PPM incision. HEART:  Regular rate rhythm, no murmurs, no rubs, no clicks Abd:  soft, positive bowel sounds, no organomegally, no rebound, no guarding Ext:  2 plus pulses, no edema, no cyanosis, no clubbing Skin:  No rashes no nodules Neuro:  CN II through XII intact, motor grossly intact  DEVICE  Normal device function.  See PaceArt for details.   Assess/Plan: 1. PAF - he is maintaining NSR. No change in meds. 2. HTN - he will continue his current medical regimen. His pressures have been good. 3. Sinus node dysfunction - he is s/p PPM and is asymptomatic.   Mikle Bosworth.D.

## 2016-03-11 ENCOUNTER — Encounter: Payer: Self-pay | Admitting: Internal Medicine

## 2016-03-11 ENCOUNTER — Other Ambulatory Visit (INDEPENDENT_AMBULATORY_CARE_PROVIDER_SITE_OTHER): Payer: Medicare Other

## 2016-03-11 ENCOUNTER — Ambulatory Visit (INDEPENDENT_AMBULATORY_CARE_PROVIDER_SITE_OTHER): Payer: Medicare Other | Admitting: Internal Medicine

## 2016-03-11 VITALS — BP 112/64 | HR 70 | Ht 69.0 in | Wt 243.0 lb

## 2016-03-11 DIAGNOSIS — R05 Cough: Secondary | ICD-10-CM

## 2016-03-11 DIAGNOSIS — J45991 Cough variant asthma: Secondary | ICD-10-CM

## 2016-03-11 DIAGNOSIS — R058 Other specified cough: Secondary | ICD-10-CM

## 2016-03-11 LAB — CBC WITH DIFFERENTIAL/PLATELET
BASOS PCT: 0.4 % (ref 0.0–3.0)
Basophils Absolute: 0 10*3/uL (ref 0.0–0.1)
EOS ABS: 0.2 10*3/uL (ref 0.0–0.7)
EOS PCT: 3.7 % (ref 0.0–5.0)
HEMATOCRIT: 49.7 % (ref 39.0–52.0)
Hemoglobin: 16.7 g/dL (ref 13.0–17.0)
Lymphocytes Relative: 23.7 % (ref 12.0–46.0)
Lymphs Abs: 1.3 10*3/uL (ref 0.7–4.0)
MCHC: 33.5 g/dL (ref 30.0–36.0)
MCV: 88.7 fl (ref 78.0–100.0)
MONO ABS: 0.6 10*3/uL (ref 0.1–1.0)
MONOS PCT: 10.9 % (ref 3.0–12.0)
NEUTROS ABS: 3.5 10*3/uL (ref 1.4–7.7)
Neutrophils Relative %: 61.3 % (ref 43.0–77.0)
PLATELETS: 195 10*3/uL (ref 150.0–400.0)
RBC: 5.6 Mil/uL (ref 4.22–5.81)
RDW: 13.5 % (ref 11.5–15.5)
WBC: 5.7 10*3/uL (ref 4.0–10.5)

## 2016-03-11 MED ORDER — AMOXICILLIN-POT CLAVULANATE 875-125 MG PO TABS: 1.0000 | ORAL_TABLET | Freq: Two times a day (BID) | ORAL | Status: DC

## 2016-03-11 MED ORDER — BUDESONIDE-FORMOTEROL FUMARATE 80-4.5 MCG/ACT IN AERO: INHALATION_SPRAY | RESPIRATORY_TRACT | Status: DC

## 2016-03-11 NOTE — Patient Instructions (Signed)
Plan A = Automatic = Symbicort 80 Take 2 puffs first thing in am and then another 2 puffs about 12 hours later.   Work on inhaler technique:  relax and gently blow all the way out then take a nice smooth deep breath back in, triggering the inhaler at same time you start breathing in.  Hold for up to 5 seconds if you can. Blow out thru nose. Rinse and gargle with water when done  Plan B = Backup Only use your albuterol (proventil) as a rescue medication to be used if you can't catch your breath by resting or doing a relaxed purse lip breathing pattern.  - The less you use it, the better it will work when you need it. - Ok to use up to 2 puffs  every 4 hours if you must but call for appointment if use goes up over your usual need - Don't leave home without it !!  (think of it like the spare tire for your car)   Please see patient coordinator before you leave today  to schedule sinus CT   Augmentin 875 mg take one pill twice daily  X 10 days - take at breakfast and supper with large glass of water.  It would help reduce the usual side effects (diarrhea and yeast infections) if you ate cultured yogurt at lunch.   Please remember to go to the lab  department downstairs for your tests - we will call you with the results when they are available.  Please schedule a follow up visit in 3 months but call sooner if needed

## 2016-03-11 NOTE — Progress Notes (Signed)
Subjective:    Patient ID: Clarence Dawson, male    DOB: 05-20-1946,     MRN: [id number]    Brief patient profile:  11 yowm quit smoking 1985 onset of breathing problems mid 2000s rx advair and helped a lot until around spring 2016 with cough varaiably productive not responding to abx x 2 / antiviral and referred to pulmonary clinic 01/24/2016 by Dr Willey Blade for refractory cough.   Brief patient profile:  01/24/2016 1st [facility] office visit/ Clarence Dawson  maint rx = advair 250 Chief Complaint  Patient presents with  . PULMONARY CONSULT    Referred by Dr. [name]. Pt c/o cough that is sometimes productive with white-brown thick mucus and some wheeze. Pt denies SOB/CP/tightness. Pt does have history of asthma since childhood. Pt is not on maintenance inhalers.   cough is worse before supper until 10 pm goes to sleep and does fine and then recurs p stirs each am  Not limited by breathing from desired activities   rec Stop lopressor and replace with bystolic 10 mg twice daily  Stop advair > Prednisone 10 mg take  4 each am x 2 days,   2 each am x 2 days,  1 each am x 2 days and stop  Pantoprazole (protonix) 40 mg   Take  30-60 min before first meal of the day and Pepcid (famotidine)  20 mg one @  bedtime until return to office - this is the best way to tell whether stomach acid is contributing to your problem.   GERD diet   02/12/2016  f/u ov/Clarence Dawson re:  Cough variant asthma vs uacs  Chief Complaint  Patient presents with  . Cough    Cough is improved since last OV.   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  Work on inhaler technique:   Ok to change back to [employer] to change back to the ranitidine when you finish up the pantoprazole     03/11/2016  f/u ov/Clarence Dawson re:  Chronic asthma/  symbicort 160 2bid   Chief Complaint  Patient presents with  . Follow-up    Cough had resolved until a  few days ago. He has minimal cough with green sputum.    cough worse in am/ ? Caught cold but notes top dentures don't fit whenever "has a cold" and now green mucus each am  Thinks symbicort much better than asthma     No obvious day to day or daytime variability or assoc sob/ need for saba    or cp or chest tightness, subjective wheeze or overt   hb symptoms. No unusual exp hx or h/o childhood pna/ asthma or knowledge of premature birth.  Sleeping ok without nocturnal  or early am exacerbation  of respiratory  c/o's or need for noct saba. Also denies any obvious fluctuation of symptoms with weather or environmental changes or other aggravating or alleviating factors except as outlined above   Current Medications, Allergies, Complete Past Medical History, Past Surgical History, Family History, and Social History were reviewed in [employer] record.  ROS  The following are not active complaints unless bolded sore throat, dysphagia, dental problems, itching, sneezing,  nasal congestion or excess/ purulent secretions, ear ache,   fever, chills, sweats, unintended wt loss, classically pleuritic or exertional cp, hemoptysis,  orthopnea pnd or leg swelling,  presyncope, palpitations, abdominal pain, anorexia, nausea, vomiting, diarrhea  or change in bowel or bladder habits, change in stools or urine, dysuria,hematuria,  rash, arthralgias, visual complaints, headache, numbness, weakness or ataxia or problems with walking or coordination,  change in mood/affect or memory.                Objective:   Physical Exam  Obese   amb wm nad min hoarse   02/12/2016        241 > 03/11/2016 243     01/24/16 237 lb (107.502 kg)  02/16/15 240 lb (108.863 kg)  11/16/14 243 lb 6 oz (110.394 kg)    Vital signs reviewed   HEENT: nl dentition, turbinates, and oropharynx. Nl external ear canals without cough reflex   NECK :  without JVD/Nodes/TM/ nl carotid upstrokes  bilaterally   LUNGS: no acc muscle use,  Nl contour chest with  NO longer End exp wheeze/ cough    CV:  RRR  no s3 or murmur or increase in P2, no edema   ABD:  soft and nontender with nl inspiratory excursion in the supine position. No bruits or organomegaly, bowel sounds nl  MS:  Nl gait/ ext warm without deformities, calf tenderness, cyanosis or clubbing No obvious joint restrictions   SKIN: warm and dry without lesions    NEURO:  alert, approp, nl sensorium with  no motor deficits         Labs ordered 03/11/2016 allergy profile         Assessment & Plan:

## 2016-03-12 ENCOUNTER — Encounter: Payer: Self-pay | Admitting: Internal Medicine

## 2016-03-12 LAB — RESPIRATORY ALLERGY PROFILE REGION II ~~LOC~~
ASPERGILLUS FUMIGATUS M3: 1.62 kU/L — AB
Allergen, D pternoyssinus,d7: 1.9 kU/L — ABNORMAL HIGH
Allergen, Mouse Urine Protein, e78: <0.1 kU/L
Allergen, Mulberry, t76: <0.1 kU/L
Allergen, Oak,t7: <0.1 kU/L
Alternaria Alternata: 0.61 kU/L — ABNORMAL HIGH
Bermuda Grass: <0.1 kU/L
Box Elder IgE: <0.1 kU/L
Cat Dander: <0.1 kU/L
Cladosporium Herbarum: <0.1 kU/L
Cockroach: 0.35 kU/L — ABNORMAL HIGH
D. farinae: 1.37 kU/L — ABNORMAL HIGH
Dog Dander: <0.1 kU/L
Elm IgE: <0.1 kU/L
IGE (IMMUNOGLOBULIN E), SERUM: 70 kU/L (ref <115)
Johnson Grass: <0.1 kU/L
Pecan/Hickory Tree IgE: <0.1 kU/L
Rough Pigweed  IgE: <0.1 kU/L
Sheep Sorrel IgE: <0.1 kU/L
Timothy Grass: <0.1 kU/L

## 2016-03-12 NOTE — Assessment & Plan Note (Signed)
02/12/2016    try symbicort 160 2bid instead of advair  - 03/11/2016  extensive coaching HFA effectiveness =    75% try symbicort 80 2bid   All goals of chronic asthma control met including optimal function and elimination of symptoms with minimal need for rescue therapy so step down to lower stength symbicort indicated  Contingencies discussed in full including contacting this office immediately if not controlling the symptoms using the rule of two's.

## 2016-03-12 NOTE — Assessment & Plan Note (Addendum)
Try off advair 01/24/2016 and on max gerd rx > cough better, wheeze worse 02/12/2016 > restart ics = symbicort 160 2bid  - Allergy profile 03/11/2016 >  Eos 0. /  IgE   - Please see patient coordinator before you leave today  to schedule sinus ct - 03/11/2016  extensive coaching HFA effectiveness =    75% > try decrease symbicort 80   It's not really clear even in retrospect how much actual asthma he has been should be able to tolerate reducing the Symbicort down to the 80 strength and continuing 2 puffs every 12 hours for now (this should be adequate to cover cough variant asthma and at the same time not irritate the upper airway, which is still probably the source of many of his concerns.)   In the meantime we need to work him up for occult sinus disease based on his morning symptoms of green sputum with a CT scan now and extend Augmentin to 20 days from the [place]. I recommended today if he has evidence of sinusitis.  I had an extended discussion with the patient reviewing all relevant studies completed to date and  lasting 15 to 20 minutes of a 25 minute visit    Each maintenance medication was reviewed in detail including most importantly the difference between maintenance and prns and under what circumstances the prns are to be triggered using an action plan format that is not reflected in the computer generated alphabetically organized AVS.    Please see instructions for details which were reviewed in writing and the patient given a copy highlighting the part that I personally wrote and discussed at today's ov.

## 2016-03-13 ENCOUNTER — Telehealth: Payer: Self-pay | Admitting: Internal Medicine

## 2016-03-13 DIAGNOSIS — Z79899 Other long term (current) drug therapy: Secondary | ICD-10-CM | POA: Diagnosis not present

## 2016-03-13 DIAGNOSIS — E039 Hypothyroidism, unspecified: Secondary | ICD-10-CM | POA: Diagnosis not present

## 2016-03-13 DIAGNOSIS — E119 Type 2 diabetes mellitus without complications: Secondary | ICD-10-CM | POA: Diagnosis not present

## 2016-03-13 NOTE — Telephone Encounter (Signed)
Spoke with the pt  He c/o severe diarrhea since starting augmentin He takes med with meal and large glass of water as directed  Rather than eating yogurt, he takes a probiotic daily  He would like to know if there is something else we can call in  He states he is not going to take augmentin anymore  Below are last AVS instructions:  Plan A = Automatic = Symbicort 80 Take 2 puffs first thing in am and then another 2 puffs about 12 hours later.   Work on inhaler technique: relax and gently blow all the way out then take a nice smooth deep breath back in, triggering the inhaler at same time you start breathing in. Hold for up to 5 seconds if you can. Blow out thru nose. Rinse and gargle with water when done  Plan B = Backup Only use your albuterol (proventil) as a rescue medication to be used if you can't catch your breath by resting or doing a relaxed purse lip breathing pattern.  - The less you use it, the better it will work when you need it. - Ok to use up to 2 puffs every 4 hours if you must but call for appointment if use goes up over your usual need - Don't leave home without it !! (think of it like the spare tire for your car)   Please see patient coordinator before you leave today to schedule sinus CT   Augmentin 875 mg take one pill twice daily X 10 days - take at breakfast and supper with large glass of water. It would help reduce the usual side effects (diarrhea and yeast infections) if you ate cultured yogurt at lunch.   Please remember to go to the lab department downstairs for your tests - we will call you with the results when they are available.  Please schedule a follow up visit in 3 months but call sooner if needed

## 2016-03-13 NOTE — Progress Notes (Signed)
Quick Note:  Spoke with pt and notified of results per Dr. Wert. Pt verbalized understanding and denied any questions.  ______ 

## 2016-03-13 NOTE — Telephone Encounter (Signed)
Patient notified of Dr. Gustavus Bryant recommendations. Added Clavulinic acid to patient's allergy list. Nothing further needed.

## 2016-03-13 NOTE — Telephone Encounter (Signed)
Sorry to hear that, let's add clavulinic acid to his list of allergies and hold off further abx until his sinus CT scheduled for 03/14/16

## 2016-03-14 ENCOUNTER — Ambulatory Visit (HOSPITAL_COMMUNITY)
Admission: RE | Admit: 2016-03-14 | Discharge: 2016-03-14 | Disposition: A | Discharge status: Home / Self Care | Payer: Medicare Other | Source: Ambulatory Visit | Attending: Internal Medicine | Admitting: Internal Medicine

## 2016-03-14 DIAGNOSIS — R05 Cough: Secondary | ICD-10-CM | POA: Diagnosis not present

## 2016-03-14 DIAGNOSIS — R058 Other specified cough: Secondary | ICD-10-CM

## 2016-03-20 DIAGNOSIS — E119 Type 2 diabetes mellitus without complications: Secondary | ICD-10-CM | POA: Diagnosis not present

## 2016-03-20 DIAGNOSIS — E039 Hypothyroidism, unspecified: Secondary | ICD-10-CM | POA: Diagnosis not present

## 2016-04-17 DIAGNOSIS — S83282A Other tear of lateral meniscus, current injury, left knee, initial encounter: Secondary | ICD-10-CM | POA: Diagnosis not present

## 2016-04-17 DIAGNOSIS — M545 Low back pain: Secondary | ICD-10-CM | POA: Diagnosis not present

## 2016-04-17 DIAGNOSIS — S46011D Strain of muscle(s) and tendon(s) of the rotator cuff of right shoulder, subsequent encounter: Secondary | ICD-10-CM | POA: Diagnosis not present

## 2016-04-22 ENCOUNTER — Other Ambulatory Visit (HOSPITAL_COMMUNITY): Payer: Self-pay | Admitting: Physical Medicine and Rehabilitation

## 2016-04-22 DIAGNOSIS — M545 Low back pain: Secondary | ICD-10-CM

## 2016-04-22 DIAGNOSIS — M47817 Spondylosis without myelopathy or radiculopathy, lumbosacral region: Secondary | ICD-10-CM | POA: Diagnosis not present

## 2016-04-22 DIAGNOSIS — M47814 Spondylosis without myelopathy or radiculopathy, thoracic region: Secondary | ICD-10-CM | POA: Diagnosis not present

## 2016-04-22 DIAGNOSIS — M546 Pain in thoracic spine: Secondary | ICD-10-CM

## 2016-04-25 ENCOUNTER — Ambulatory Visit (HOSPITAL_COMMUNITY)
Admission: RE | Admit: 2016-04-25 | Discharge: 2016-04-25 | Disposition: A | Discharge status: Home / Self Care | Payer: Medicare Other | Source: Ambulatory Visit | Attending: Physical Medicine and Rehabilitation | Admitting: Physical Medicine and Rehabilitation

## 2016-04-25 DIAGNOSIS — M546 Pain in thoracic spine: Secondary | ICD-10-CM | POA: Insufficient documentation

## 2016-04-25 DIAGNOSIS — M545 Low back pain: Secondary | ICD-10-CM | POA: Insufficient documentation

## 2016-04-25 DIAGNOSIS — M47816 Spondylosis without myelopathy or radiculopathy, lumbar region: Secondary | ICD-10-CM | POA: Insufficient documentation

## 2016-04-25 DIAGNOSIS — M4806 Spinal stenosis, lumbar region: Secondary | ICD-10-CM | POA: Diagnosis not present

## 2016-04-28 DIAGNOSIS — S83241A Other tear of medial meniscus, current injury, right knee, initial encounter: Secondary | ICD-10-CM | POA: Diagnosis not present

## 2016-05-05 ENCOUNTER — Other Ambulatory Visit: Payer: Self-pay | Admitting: Orthopedic Surgery

## 2016-05-05 DIAGNOSIS — M25561 Pain in right knee: Secondary | ICD-10-CM

## 2016-05-05 DIAGNOSIS — S83241D Other tear of medial meniscus, current injury, right knee, subsequent encounter: Secondary | ICD-10-CM | POA: Diagnosis not present

## 2016-05-07 ENCOUNTER — Ambulatory Visit
Admission: RE | Admit: 2016-05-07 | Discharge: 2016-05-07 | Disposition: A | Discharge status: Home / Self Care | Payer: Medicare Other | Source: Ambulatory Visit | Attending: Orthopedic Surgery | Admitting: Orthopedic Surgery

## 2016-05-07 ENCOUNTER — Other Ambulatory Visit: Payer: Medicare Other

## 2016-05-07 DIAGNOSIS — M25561 Pain in right knee: Secondary | ICD-10-CM

## 2016-05-07 DIAGNOSIS — M7989 Other specified soft tissue disorders: Secondary | ICD-10-CM | POA: Diagnosis not present

## 2016-05-12 DIAGNOSIS — M47817 Spondylosis without myelopathy or radiculopathy, lumbosacral region: Secondary | ICD-10-CM | POA: Diagnosis not present

## 2016-05-12 DIAGNOSIS — M545 Low back pain: Secondary | ICD-10-CM | POA: Diagnosis not present

## 2016-05-12 DIAGNOSIS — M4806 Spinal stenosis, lumbar region: Secondary | ICD-10-CM | POA: Diagnosis not present

## 2016-05-20 DIAGNOSIS — M47817 Spondylosis without myelopathy or radiculopathy, lumbosacral region: Secondary | ICD-10-CM | POA: Diagnosis not present

## 2016-05-21 DIAGNOSIS — Y939 Activity, unspecified: Secondary | ICD-10-CM | POA: Diagnosis not present

## 2016-05-21 DIAGNOSIS — S83241A Other tear of medial meniscus, current injury, right knee, initial encounter: Secondary | ICD-10-CM | POA: Diagnosis not present

## 2016-05-21 DIAGNOSIS — Y999 Unspecified external cause status: Secondary | ICD-10-CM | POA: Diagnosis not present

## 2016-05-21 DIAGNOSIS — Y929 Unspecified place or not applicable: Secondary | ICD-10-CM | POA: Diagnosis not present

## 2016-05-21 DIAGNOSIS — G8918 Other acute postprocedural pain: Secondary | ICD-10-CM | POA: Diagnosis not present

## 2016-05-21 DIAGNOSIS — M94261 Chondromalacia, right knee: Secondary | ICD-10-CM | POA: Diagnosis not present

## 2016-05-21 DIAGNOSIS — S83281A Other tear of lateral meniscus, current injury, right knee, initial encounter: Secondary | ICD-10-CM | POA: Diagnosis not present

## 2016-05-21 DIAGNOSIS — M659 Synovitis and tenosynovitis, unspecified: Secondary | ICD-10-CM | POA: Diagnosis not present

## 2016-05-23 DIAGNOSIS — M47817 Spondylosis without myelopathy or radiculopathy, lumbosacral region: Secondary | ICD-10-CM | POA: Diagnosis not present

## 2016-05-23 DIAGNOSIS — M4806 Spinal stenosis, lumbar region: Secondary | ICD-10-CM | POA: Diagnosis not present

## 2016-05-29 DIAGNOSIS — S83241D Other tear of medial meniscus, current injury, right knee, subsequent encounter: Secondary | ICD-10-CM | POA: Diagnosis not present

## 2016-05-29 DIAGNOSIS — S83281D Other tear of lateral meniscus, current injury, right knee, subsequent encounter: Secondary | ICD-10-CM | POA: Diagnosis not present

## 2016-06-02 ENCOUNTER — Ambulatory Visit (HOSPITAL_COMMUNITY): Payer: Medicare Other | Attending: Orthopedic Surgery | Admitting: Physical Therapy

## 2016-06-02 DIAGNOSIS — M6281 Muscle weakness (generalized): Secondary | ICD-10-CM | POA: Diagnosis not present

## 2016-06-02 DIAGNOSIS — M25561 Pain in right knee: Secondary | ICD-10-CM | POA: Diagnosis not present

## 2016-06-02 DIAGNOSIS — M545 Low back pain: Secondary | ICD-10-CM | POA: Diagnosis not present

## 2016-06-02 DIAGNOSIS — M47817 Spondylosis without myelopathy or radiculopathy, lumbosacral region: Secondary | ICD-10-CM | POA: Diagnosis not present

## 2016-06-02 DIAGNOSIS — M4806 Spinal stenosis, lumbar region: Secondary | ICD-10-CM | POA: Diagnosis not present

## 2016-06-02 DIAGNOSIS — R293 Abnormal posture: Secondary | ICD-10-CM | POA: Insufficient documentation

## 2016-06-02 NOTE — Patient Instructions (Signed)
   Seated SL hamstring stretch  Seated on a flat surface keep one leg straight. For a greater stretch pull your toes toward your face.  Hold for 30 seconds and repeat twice each leg, twice a day.    PIRIFORMIS AND HIP STRETCH - SEATED  While sitting in a chair, cross your affected leg on top of the other as shown.   Next, gently lean forward until a stretch is felt along the crossed leg.  Hold for 30 seconds and repeat twice each side, twice a day.     SCAPULAR RETRACTIONS  Draw your shoulder blades back and down. It should feel like you are squeezing your shoulder blades together.  Repeat 15 times, twice a day.    Shoulder Rolls  Sit with arm resting on a table (or bed). Start with arm circles, focusing on moving the shoulder in an up-back-down motion while keeping your arms in your lap.  Repeat 15 times, twice a day.

## 2016-06-02 NOTE — Therapy (Signed)
[facility] [address] [place], [place], [address] Phone: [phone]   Fax:  [phone]  Physical Therapy Evaluation  Patient Details  Name: Clarence Dawson MRN: [id number] Date of Birth: 06-10-1946 Referring Provider: Elsie Saas   Encounter Date: 06/02/2016      PT End of Session - 06/02/16 1748    Visit Number 1   Number of Visits 12   Date for PT Re-Evaluation 06/23/16   Authorization Type Medicare/Cign[employer]    Authorization Time Period 06/02/16 to 07/14/16   Authorization - Visit Number 1   Authorization - Number of Visits 10   PT Start Time 1093   PT Stop Time 1427   PT Time Calculation (min) 46 min   Activity Tolerance Patient tolerated treatment well   Behavior During Therapy [facility] for tasks assessed/performed      Past Medical History  Diagnosis Date  . Hypertension   . Allergic rhinitis   . Atrial fibrillation ([place])   . Syncope   . Sinoatrial node dysfunction (HCC)   . 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 Hyperlipidemia   . RBBB (right bundle branch block)     Coronary Artherosclerosis  . Tricuspid valve disorder   . Aortic valve disorder   . Carotid artery stenosis   . Diabetes ([place])   . Hemorrhage of rectum   . Pacemaker     Oct 2005 in [place].  . Asthma     since childhood- seasonal allergies induced  . Arthritis     ostoearthritis  . Sinoatrial node dysfunction (HCC)   . Hypothyroidism   . GERD (gastroesophageal reflux disease)   . H/O hiatal hernia   . OSA (obstructive sleep apnea)     done in [place] 2005- place no longer in business-does not use  . Full dentures     Past Surgical History  Procedure Laterality Date  . Gallbladder surgery  12/01/2006  . Rotator cuff surgery  2001    Right shoulder  . Carpal tunnel release  1994    right wrist  . Arthropathy  2005    Rebuilding of left thumb and joint   . Hemorroidectomy  2003  . Circumcision    . A-v cardiac pacemaker insertion      Sick sinus syndrome DDR  pacer  . Cardiac electrophysiology study and ablation  09/2008    for pvcs, Dr. Loralie Champagne  . Left knee arthroscopy      [facility]- [facility]  . Carpal tunnel release  05/04/2012    Procedure: CARPAL TUNNEL RELEASE;  Surgeon: Wynonia Sours, MD;  Location: [facility];  Service: Orthopedics;  Laterality: Left;  . Cardiac catheterization    . 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 Tonsillectomy    . Cholecystectomy  1994  . Partial knee arthroplasty  11/22/2012    Procedure: UNICOMPARTMENTAL KNEE;  Surgeon: Lorn Junes, MD;  Location: [facility];  Service: Orthopedics;  Laterality: Left;  left unicompartmental knee arthroplasty  . Hand tendon surgery Left late 1990's    thumb  . Colonoscopy N/A 03/14/2013    Procedure: COLONOSCOPY;  Surgeon: Daneil Dolin, MD;  [facility];  Service: Endoscopy;  Laterality: N/A;  8:15 AM  . Carpometacarpel suspension plasty Right 11/16/2014    Procedure: SUSPENSIONPLASTY RIGHT THUMB TENDON TRANSFER ABDUCTOR POLLICUS LONGUS EXCISION  TRAPEZIUM;  Surgeon: Daryll Brod, MD;  Location: [facility];  Service: Orthopedics;  Laterality: Right;  . Permanent pacemaker generator change N/A 01/12/2013    Procedure: PERMANENT PACEMAKER GENERATOR CHANGE;  Surgeon: Evans Lance, MD;  Location: [facility] CATH LAB;  Service: Cardiovascular;  Laterality: N/A;    There were no vitals filed for this visit.       Subjective Assessment - 06/02/16 1346    Subjective Patient reports that he had his R knee scope done ont he 24th, and on the 26th he reports that he had a series of injections in his back; he just saw MD this morning who thought patient was doing very well. In terms of his knee, it is going well; at the end of the day it will swell but ice is able to resolve this and he is not really having any major problems. No issues with his  back after he received the series of injections, but reports that his was fairly immobile regarding his back before the shots. He does have some pain starting just above his lateral knee that comes up his leg. He fell on February 14th, off of his tractor, and cracked 4 ribs; no falls otherwise.    Pertinent History beta blockers, Xarelto, large cardiac history with pacemaker installed, diabetes, L parital knee replacement    How long can you sit comfortably? sitting in a chair is not a problem; if  he is sitting in a car it is uncomfortable    How long can you stand comfortably? no  limits but would have to shift around    How long can you walk comfortably? has not really tried walking long distance yet but can walk around a store    Patient Stated Goals get back to PLOF   Currently in Pain? No/denies            [facility] PT Assessment - 06/02/16 0001    Assessment   Medical Diagnosis R knee scope, DDD with R radiculopathy    Referring Provider Elsie Saas    Onset Date/Surgical Date 05/21/16   Next MD Visit Noemi Chapel on June 22nd    Precautions   Precaution Comments can't pick up heavy items right now   Balance Screen   Has the patient fallen in the past 6 months Yes   How many times? 1  fell off tractor, broke 4 ribs    Has the patient had a decrease in activity level because of a fear of falling?  No  but has been more cautious    Is the patient reluctant to leave their home because of a fear of falling?  No   Prior Function   Level of Independence Independent;Independent with basic ADLs;Independent with gait;Independent with transfers   Vocation Retired   Leisure taking care of land and horses, being outdoors    Observation/Other Assessments   Observations bilateral hip scour and FABER tests negative; no tenderness to palpation R distal ITB attachment    Focus on Therapeutic Outcomes (FOTO)  55% limited (knee)   AROM   Overall AROM Comments bilateral hip ROM appears moderately  limited  by soft tissues and posture    Right Knee Extension 2   Right Knee Flexion 125   Lumbar Flexion approx 2-3 inches above toes    Lumbar Extension WFL    Lumbar - Right Side Bend fingertips just past midline of knee joint    Lumbar - Left Side Bend fingertips just  past midline of knee joint    Strength   Right Hip Flexion 5/5   Right Hip Extension 4/5   Right Hip ABduction 4/5   Left Hip Flexion 5/5   Left Hip Extension 4/5   Left Hip ABduction 4/5   Right Knee Flexion 4+/5   Right Knee Extension 4+/5   Left Knee Flexion 5/5   Left Knee Extension 5/5   Right Ankle Dorsiflexion 5/5   Left Ankle Dorsiflexion 5/5   Flexibility   Hamstrings moderate tightness B    ITB moderate tightness R    Piriformis mild tightness B    Ambulation/Gait   Gait Comments proximal muscle weakness    6 minute walk test results    Aerobic Endurance Distance Walked 791   Endurance additional comments 3WMT    Timed Up and Go Test   Normal TUG (seconds) 7.37                           PT Education - 06/02/16 1748    Education provided Yes   Education Details prognosis, POC, HEP    Person(s) Educated Patient   Methods Explanation;Demonstration;Handout   Comprehension Verbalized understanding;Returned demonstration;Need further instruction          PT Short Term Goals - 06/02/16 1754    PT SHORT TERM GOAL #1   Title Patient to report LBP no more than 2/10 with functional task performance in order to demonstrate improving general function and mobility    Time 3   Period Weeks   Status New   PT SHORT TERM GOAL #2   Title Patient to report R knee pain no more than 1/10 with all functional tasks in order to demonstrate general improvement and task performance    Time 3   Period Weeks   Status New   PT SHORT TERM GOAL #3   Title Patient to demonstrate at least a 50% reduction in muscle flexibiltiy in order to reduce pain, improve overall posture, and improve functional  performance    Time 3   Period Weeks   Status New   PT SHORT TERM GOAL #4   Title Patient to correctly and consistently perform appropriate HEP, to be updated PRN    Time 3   Period Weeks   Status New           PT Long Term Goals - 06/02/16 1756    PT LONG TERM GOAL #1   Title Patient to demonstrate strength 5/5 in all tested muscle groups in order to assist in maintaining correct functional posture and reducing pain    Time 6   Period Weeks   Status New   PT LONG TERM GOAL #2   Title Patient to maintain correct functional posture at least 80% of the time during functional task performance in order to reduce pain and improve mechanics    Time 6   Period Weeks   Status New   PT LONG TERM GOAL #3   Title Patient to demonstrate correct functional lifting mechanics as well as correct log rolling technique with bed mobility  in order to maintain integrity of back and reduce chances of re-injury/pain exacerbation    Time 6   Period Weeks   Status New   PT LONG TERM GOAL #4   Title Patient to be participatory in regular exercise program, at least 3 days per week at least 20 minutes in duration, in order to maintain  functional gains and assist in maintaining improved general health status    Time 6   Period Weeks   Status New               Plan - 06/02/16 1749    Clinical Impression Statement Patient arrives today s/p R knee scope and LBP with R sided radiculopathy, for which patient reports he recently received injections that have reduced majority of LBP, per MD order; at this time patient states that his knee is more problematic than his back. Upon examination patient reveals impaired posture, muscle weakness, impaired muscle flexibility, impaired gait, and reduced ability to return to activities of PLOF at this time. Possible radiculopathy mentioned in MD order does not present with classic radicular presentation, as it radiates up from lateral knee, however does not  present as classic ITB syndrome either- will continue to monitor as patient progesses with skilled PT services. Patient does appear very motivated to participate in skilled PT services, and was educated that PT will work on both his knees and his back per MD order, as while injection may have temporarily reduce pain it is improtant to train correct posture and lifting mechancis in order to prevent exacerbatin of pain or further injurty to low back structures. At this point recommend skilled PT services to address functional limitations and assist patient in reaching optimal level of function.    Rehab Potential Good   PT Frequency 2x / week   PT Duration 6 weeks   PT Treatment/Interventions ADLs/Self Care Home Management;Cryotherapy;Moist Heat;Gait training;Stair training;Functional mobility training;Therapeutic activities;Therapeutic exercise;Balance training;Neuromuscular re-education;Patient/family education;Manual techniques;Scar mobilization;Passive range of motion;Energy conservation;Taping   PT Next Visit Plan review HEP and goals; functional stretching for low back and R knee, functional strengthening and postural training    PT Home Exercise Plan given    Consulted and Agree with Plan of Care Patient      Patient will benefit from skilled therapeutic intervention in order to improve the following deficits and impairments:  Abnormal gait, Decreased skin integrity, Decreased scar mobility, Increased edema, Decreased activity tolerance, Decreased strength, Pain, Improper body mechanics, Decreased coordination, Impaired flexibility, Postural dysfunction  Visit Diagnosis: Right low back pain, with sciatica presence unspecified - Plan: PT plan of care cert/re-cert  Pain in right knee - Plan: PT plan of care cert/re-cert  Muscle weakness (generalized) - Plan: PT plan of care cert/re-cert  Abnormal posture - Plan: PT plan of care cert/re-cert      G-Codes - 30/07/62 1801    Functional  Assessment Tool Used FOTO for knee 55% limited however overall estimate patient to be approximately 45% limited based on posture, strength, mobility, ROM, gait    Functional Limitation Mobility: Walking and moving around   Mobility: Walking and Moving Around Current Status [phone]) At least 40 percent but less than 60 percent impaired, limited or restricted   Mobility: Walking and Moving Around Goal Status [phone]) At least 20 percent but less than 40 percent impaired, limited or restricted       Problem List Patient Active Problem List   Diagnosis Date Noted  . Cough variant asthma 02/12/2016  . Upper airway cough syndrome 01/24/2016  . 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 Hypertension   . Atrial fibrillation ([place])   . Syncope   . Hyperthyroidism   . Hyperlipidemia   . RBBB (right bundle branch block)   . Tricuspid valve disorder   . Aortic valve disorder   . Carotid artery stenosis   .  Diabetes ([place])   . Pacemaker   . OSA (obstructive sleep apnea)   . Arthritis   . Sinoatrial node dysfunction (HCC)   . PVC's (premature ventricular contractions) 07/17/2011  . Essential hypertension, benign 01/24/2011  . PREMATURE VENTRICULAR CONTRACTIONS 01/24/2011  . PPM-St.Jude 01/24/2011    Deniece Ree PT, DPT [phone]  [facility] [address] [place], [place], [address] Phone: [phone]   Fax:  [phone]  Name: Clarence Dawson MRN: [id number] Date of Birth: 12-12-1946

## 2016-06-04 ENCOUNTER — Ambulatory Visit (HOSPITAL_COMMUNITY): Payer: Medicare Other

## 2016-06-04 DIAGNOSIS — R293 Abnormal posture: Secondary | ICD-10-CM | POA: Diagnosis not present

## 2016-06-04 DIAGNOSIS — M6281 Muscle weakness (generalized): Secondary | ICD-10-CM | POA: Diagnosis not present

## 2016-06-04 DIAGNOSIS — M25561 Pain in right knee: Secondary | ICD-10-CM

## 2016-06-04 DIAGNOSIS — M545 Low back pain: Secondary | ICD-10-CM | POA: Diagnosis not present

## 2016-06-04 LAB — CUP PACEART INCLINIC DEVICE CHECK
Implantable Lead Implant Date: 20051106
Implantable Lead Location: 753860
MDC IDC LEAD IMPLANT DT: 20051106
MDC IDC LEAD LOCATION: 753859
MDC IDC SESS DTM: 20170607155146
Pulse Gen Serial Number: 7439597

## 2016-06-04 LAB — CUP PACEART REMOTE DEVICE CHECK
Battery Remaining Percentage: 95.5 %
Battery Voltage: 2.99 V
Brady Statistic AP VP Percent: 1.9 %
Brady Statistic AS VP Percent: 1 %
Brady Statistic AS VS Percent: 7.4 %
Brady Statistic RA Percent Paced: 87 %
Implantable Lead Implant Date: 20051106
Implantable Lead Location: 753859
Lead Channel Impedance Value: 610 Ohm
Lead Channel Pacing Threshold Amplitude: 0.75 V
Lead Channel Pacing Threshold Pulse Width: 0.4 ms
Lead Channel Pacing Threshold Pulse Width: 0.4 ms
Lead Channel Sensing Intrinsic Amplitude: 2.8 mV
Lead Channel Sensing Intrinsic Amplitude: 7.4 mV
Lead Channel Setting Pacing Amplitude: 2 V
MDC IDC LEAD IMPLANT DT: 20051106
MDC IDC LEAD LOCATION: 753860
MDC IDC MSMT BATTERY REMAINING LONGEVITY: 124 mo
MDC IDC MSMT LEADCHNL RA IMPEDANCE VALUE: 360 Ohm
MDC IDC MSMT LEADCHNL RV PACING THRESHOLD AMPLITUDE: 0.875 V
MDC IDC PG SERIAL: 7439597
MDC IDC SESS DTM: 20161129144822
MDC IDC SET LEADCHNL RV PACING AMPLITUDE: 1.125
MDC IDC SET LEADCHNL RV PACING PULSEWIDTH: 0.4 ms
MDC IDC SET LEADCHNL RV SENSING SENSITIVITY: 2 mV
MDC IDC STAT BRADY AP VS PERCENT: 90 %
MDC IDC STAT BRADY RV PERCENT PACED: 2.6 %

## 2016-06-04 NOTE — Therapy (Signed)
[facility] [address] [place], [place], [address] Phone: [phone]   Fax:  [phone]  Physical Therapy Treatment  Patient Details  Name: Clarence Dawson MRN: [id number] Date of Birth: [birth year] Referring Provider: Elsie Saas   Encounter Date: 06/04/2016      PT End of Session - 06/04/16 1126    Visit Number 2   Number of Visits 12   Date for PT Re-Evaluation 06/23/16   Authorization Type Medicare/Cigna    Authorization Time Period 06/02/16 to 07/14/16   Authorization - Visit Number 2   Authorization - Number of Visits 10   PT Start Time 1117   PT Stop Time 1210   PT Time Calculation (min) 53 min   Activity Tolerance Patient tolerated treatment well   Behavior During Therapy [facility] for tasks assessed/performed      Past Medical History  Diagnosis Date  . Hypertension   . Allergic rhinitis   . Atrial fibrillation ([place])   . Syncope   . Sinoatrial node dysfunction (HCC)   . 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 Hyperlipidemia   . RBBB (right bundle branch block)     Coronary Artherosclerosis  . Tricuspid valve disorder   . Aortic valve disorder   . Carotid artery stenosis   . Diabetes ([place])   . Hemorrhage of rectum   . Pacemaker     Oct 2005 in [place].  . Asthma     since childhood- seasonal allergies induced  . Arthritis     ostoearthritis  . Sinoatrial node dysfunction (HCC)   . Hypothyroidism   . GERD (gastroesophageal reflux disease)   . H/O hiatal hernia   . OSA (obstructive sleep apnea)     done in [place] 2005- place no longer in business-does not use  . Full dentures     Past Surgical History  Procedure Laterality Date  . Gallbladder surgery  12/01/2006  . Rotator cuff surgery  2001    Right shoulder  . Carpal tunnel release  1994    right wrist  . Arthropathy  2005    Rebuilding of left thumb and joint   . Hemorroidectomy  2003  . Circumcision    . A-v cardiac pacemaker insertion      Sick sinus syndrome DDR pacer   . Cardiac electrophysiology study and ablation  09/2008    for pvcs, Dr. Loralie Champagne  . Left knee arthroscopy      April 21 2011- [facility] center  . Carpal tunnel release  05/04/2012    Procedure: CARPAL TUNNEL RELEASE;  Surgeon: Wynonia Sours, MD;  Location: [facility];  Service: Orthopedics;  Laterality: Left;  . Cardiac catheterization    . 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 Tonsillectomy    . Cholecystectomy  1994  . Partial knee arthroplasty  11/22/2012    Procedure: UNICOMPARTMENTAL KNEE;  Surgeon: Lorn Junes, MD;  Location: [facility];  Service: Orthopedics;  Laterality: Left;  left unicompartmental knee arthroplasty  . Hand tendon surgery Left late 1990's    thumb  . Colonoscopy N/A 03/14/2013    Procedure: COLONOSCOPY;  Surgeon: Daneil Dolin, MD;  [facility];  Service: Endoscopy;  Laterality: N/A;  8:15 AM  . Carpometacarpel suspension plasty Right 11/16/2014    Procedure: SUSPENSIONPLASTY RIGHT THUMB TENDON TRANSFER ABDUCTOR POLLICUS LONGUS EXCISION  TRAPEZIUM;  Surgeon: Daryll Brod, MD;  Location: [facility];  Service: Orthopedics;  Laterality: Right;  . Permanent pacemaker generator change N/A 01/12/2013    Procedure: PERMANENT PACEMAKER GENERATOR CHANGE;  Surgeon: Evans Lance, MD;  [facility] CATH LAB;  Service: Cardiovascular;  Laterality: N/A;    There were no vitals filed for this visit.      Subjective Assessment - 06/04/16 1120    Subjective Pt reports complaince with HEP with no questions.  Current pain scale 1/10 Rt knee stiffness and LBP Rt side 3/10 stabbing when supine.   Pertinent History beta blockers, Xarelto, large cardiac history with pacemaker installed, diabetes, L parital knee replacement    Patient Stated Goals get back to PLOF   Currently in Pain? Yes   Pain Score 1    Pain Location Knee   Pain Orientation  Right   Pain Descriptors / Indicators Tightness  Stiffness   Pain Type Surgical pain   Pain Onset 1 to 4 weeks ago   Pain Frequency Intermittent   Aggravating Factors  riding in car   Pain Relieving Factors walking and standing   Effect of Pain on Daily Activities pain   Multiple Pain Sites Yes   Pain Score 3   Pain Location Back   Pain Orientation Lower;Right   Pain Descriptors / Indicators Stabbing   Pain Type Chronic pain   Pain Onset More than a month ago   Pain Frequency Intermittent   Aggravating Factors  laying down, sleeping   Pain Relieving Factors raising up when laying down   Effect of Pain on Daily Activities minimal effects of ADLs              [facility] Adult PT Treatment/Exercise - 06/04/16 0001    Bed Mobility   Bed Mobility --  Reviewed log rolling technique   Exercises   Exercises Lumbar   Lumbar Exercises: Stretches   Active Hamstring Stretch 3 reps;30 seconds   Active Hamstring Stretch Limitations long sit stretch with rope   Lower Trunk Rotation 5 reps;10 seconds   Piriformis Stretch 2 reps;30 seconds   Piriformis Stretch Limitations seated   Lumbar Exercises: Seated   Other Seated Lumbar Exercises cervical retraction with cueing for form (hand under chin with chin tucks)   Other Seated Lumbar Exercises scapular retractjon   Lumbar Exercises: Supine   Ab Set 10 reps;5 seconds   AB Set Limitations with Biofeedback   Heel Slides 15 reps   Heel Slides Limitations Biofeedback to activate core    Bridge 15 reps   Other Supine Lumbar Exercises SAQ 15x with   Manual Therapy   Manual Therapy Other (comment)   Manual therapy comments Manual complete separate rest of apt   Other Manual Therapy Checked SI alignment, no MET required                   PT Short Term Goals - 06/02/16 1754    PT SHORT TERM GOAL #1   Title Patient to report LBP no more than 2/10 with functional task performance in order to demonstrate improving general function and  mobility    Time 3   Period Weeks   Status New   PT SHORT TERM GOAL #2   Title Patient to report R knee pain no more than 1/10 with all functional tasks in order to demonstrate general improvement and task performance    Time 3   Period Weeks   Status New  PT SHORT TERM GOAL #3   Title Patient to demonstrate at least a 50% reduction in muscle flexibiltiy in order to reduce pain, improve overall posture, and improve functional performance    Time 3   Period Weeks   Status New   PT SHORT TERM GOAL #4   Title Patient to correctly and consistently perform appropriate HEP, to be updated PRN    Time 3   Period Weeks   Status New           PT Long Term Goals - 06/02/16 1756    PT LONG TERM GOAL #1   Title Patient to demonstrate strength 5/5 in all tested muscle groups in order to assist in maintaining correct functional posture and reducing pain    Time 6   Period Weeks   Status New   PT LONG TERM GOAL #2   Title Patient to maintain correct functional posture at least 80% of the time during functional task performance in order to reduce pain and improve mechanics    Time 6   Period Weeks   Status New   PT LONG TERM GOAL #3   Title Patient to demonstrate correct functional lifting mechanics as well as correct log rolling technique with bed mobility  in order to maintain integrity of back and reduce chances of re-injury/pain exacerbation    Time 6   Period Weeks   Status New   PT LONG TERM GOAL #4   Title Patient to be participatory in regular exercise program, at least 3 days per week at least 20 minutes in duration, in order to maintain functional gains and assist in maintaining improved general health status    Time 6   Period Weeks   Status New               Plan - 06/04/16 1139    Clinical Impression Statement Reviewed goals, compliance and assured correct form with HEP and copy of eval given to pt.  Session focus on improving knee and lower back mobility as well  as core strengthening and education on importance of posture to reduce stress on spinal musculature.  Therapist faciilitation required for proper TrA activation, utilized verbal and tactile cueing as well as biofeedback.  Pt reports increased LB pain in supine position, checked SI alignment for possilble LLD, no MET required.  Reviewed mechanics for bed mobility, pt able to demosntrate approriate form with log rolling.  Pt reports increased LBP at end of session with laying supine position.  No reports of pain in knee and stated increased mobility.  Did c/o LE cramping through session.     Rehab Potential Good   PT Frequency 2x / week   PT Duration 6 weeks   PT Treatment/Interventions ADLs/Self Care Home Management;Cryotherapy;Moist Heat;Gait training;Stair training;Functional mobility training;Therapeutic activities;Therapeutic exercise;Balance training;Neuromuscular re-education;Patient/family education;Manual techniques;Scar mobilization;Passive range of motion;Energy conservation;Taping   PT Next Visit Plan Continue with functional stretching for low back and R knee, functional strengthening and postural training       Patient will benefit from skilled therapeutic intervention in order to improve the following deficits and impairments:  Abnormal gait, Decreased skin integrity, Decreased scar mobility, Increased edema, Decreased activity tolerance, Decreased strength, Pain, Improper body mechanics, Decreased coordination, Impaired flexibility, Postural dysfunction  Visit Diagnosis: Right low back pain, with sciatica presence unspecified  Pain in right knee  Muscle weakness (generalized)  Abnormal posture     Problem List Patient Active Problem List   Diagnosis Date Noted  .  Cough variant asthma 02/12/2016  . Upper airway cough syndrome 01/24/2016  . 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 Hypertension   . Atrial fibrillation ([place])   . Syncope   . Hyperthyroidism   . Hyperlipidemia   . RBBB  (right bundle branch block)   . Tricuspid valve disorder   . Aortic valve disorder   . Carotid artery stenosis   . Diabetes ([place])   . Pacemaker   . OSA (obstructive sleep apnea)   . Arthritis   . Sinoatrial node dysfunction (HCC)   . PVC's (premature ventricular contractions) 07/17/2011  . Essential hypertension, benign 01/24/2011  . PREMATURE VENTRICULAR CONTRACTIONS 01/24/2011  . 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

## 2016-06-09 ENCOUNTER — Ambulatory Visit (HOSPITAL_COMMUNITY): Payer: Medicare Other | Admitting: Physical Therapy

## 2016-06-09 ENCOUNTER — Ambulatory Visit (INDEPENDENT_AMBULATORY_CARE_PROVIDER_SITE_OTHER): Payer: Medicare Other | Admitting: *Deleted

## 2016-06-09 DIAGNOSIS — M545 Low back pain: Secondary | ICD-10-CM

## 2016-06-09 DIAGNOSIS — M25561 Pain in right knee: Secondary | ICD-10-CM

## 2016-06-09 DIAGNOSIS — I495 Sick sinus syndrome: Secondary | ICD-10-CM

## 2016-06-09 DIAGNOSIS — R293 Abnormal posture: Secondary | ICD-10-CM | POA: Diagnosis not present

## 2016-06-09 DIAGNOSIS — M6281 Muscle weakness (generalized): Secondary | ICD-10-CM | POA: Diagnosis not present

## 2016-06-09 NOTE — Therapy (Signed)
[place], [place], [address] Phone: [phone]   Fax:  [phone]  Physical Therapy Treatment  Patient Details  Name: Clarence Dawson MRN: [id number] Date of Birth: December 30, 1945 Referring Provider: Elsie Saas   Encounter Date: 06/09/2016      PT End of Session - 06/09/16 1819    Visit Number 3   Number of Visits 12   Date for PT Re-Evaluation 06/23/16   Authorization Type Medicare/[employer]    Authorization Time Period 06/02/16 to 07/14/16   Authorization - Visit Number 3   Authorization - Number of Visits 10   PT Start Time 9323   PT Stop Time 1513   PT Time Calculation (min) 41 min   Activity Tolerance Patient tolerated treatment well   Behavior During Therapy [facility] for tasks assessed/performed      Past Medical History  Diagnosis Date  . Hypertension   . Allergic rhinitis   . Atrial fibrillation ([place])   . Syncope   . Sinoatrial node dysfunction (HCC)   . 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 Hyperlipidemia   . RBBB (right bundle branch block)     Coronary Artherosclerosis  . Tricuspid valve disorder   . Aortic valve disorder   . Carotid artery stenosis   . Diabetes ([place])   . Hemorrhage of rectum   . Pacemaker     Oct 2005 in [place].  . Asthma     since childhood- seasonal allergies induced  . Arthritis     ostoearthritis  . Sinoatrial node dysfunction (HCC)   . Hypothyroidism   . GERD (gastroesophageal reflux disease)   . H/O hiatal hernia   . OSA (obstructive sleep apnea)     done in [place] 2005- place no longer in business-does not use  . Full dentures     Past Surgical History  Procedure Laterality Date  . Gallbladder surgery  12/01/2006  . Rotator cuff surgery  2001    Right shoulder  . Carpal tunnel release  1994    right wrist  . Arthropathy  2005    Rebuilding of left thumb and joint   . Hemorroidectomy  2003  . Circumcision    . A-v cardiac pacemaker insertion      Sick sinus syndrome DDR  pacer  . Cardiac electrophysiology study and ablation  09/2008    for pvcs, Dr. Loralie Champagne  . Left knee arthroscopy      [facility]  . Carpal tunnel release  05/04/2012    Procedure: CARPAL TUNNEL RELEASE;  Surgeon: Wynonia Sours, MD;  Location: [place];  Service: Orthopedics;  Laterality: Left;  . Cardiac catheterization    . 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 Tonsillectomy    . Cholecystectomy  1994  . Partial knee arthroplasty  11/22/2012    Procedure: UNICOMPARTMENTAL KNEE;  Surgeon: Lorn Junes, MD;  Location: [facility];  Service: Orthopedics;  Laterality: Left;  left unicompartmental knee arthroplasty  . Hand tendon surgery Left late 1990's    thumb  . Colonoscopy N/A 03/14/2013    Procedure: COLONOSCOPY;  Surgeon: Daneil Dolin, MD;  [facility];  Service: Endoscopy;  Laterality: N/A;  8:15 AM  . Carpometacarpel suspension plasty Right 11/16/2014    Procedure: SUSPENSIONPLASTY RIGHT THUMB TENDON TRANSFER ABDUCTOR POLLICUS LONGUS EXCISION  TRAPEZIUM;  Surgeon: Daryll Brod, MD;  Location: [facility];  Service: Orthopedics;  Laterality: Right;  . Permanent pacemaker generator change N/A 01/12/2013    Procedure: PERMANENT PACEMAKER GENERATOR CHANGE;  Surgeon: Evans Lance, MD;  Location: [facility] CATH LAB;  Service: Cardiovascular;  Laterality: N/A;    There were no vitals filed for this visit.      Subjective Assessment - 06/09/16 1434    Subjective Pt states he has been doing good. Notes his Rt knee was swollen and a little painful after his last session when it popped during a stretch. States it is getting better though.    Pertinent History beta blockers, Xarelto, large cardiac history with pacemaker installed, diabetes, L parital knee replacement    Patient Stated Goals get back to PLOF   Currently in Pain? No/denies    Pain Onset --   Pain Onset More than a month ago                         [facility] Adult PT Treatment/Exercise - 06/09/16 0001    Lumbar Exercises: Stretches   Sports administrator 2 reps;20 seconds  RLE    Sports administrator Limitations thomas position   Lumbar Exercises: Seated   Other Seated Lumbar Exercises UE punches with red TB resisting rotation x20 each   Other Seated Lumbar Exercises UE elevation with green TB while resisting rotation x20 each side   Lumbar Exercises: Supine   Bent Knee Raise 15 reps  1 set each, with cuff biofeedback   Other Supine Lumbar Exercises lower trunk rotation x20 reps   Other Supine Lumbar Exercises Log roll to each side x4 trials                PT Education - 06/09/16 1450    Education provided Yes   Education Details reviewed HEP, discussed importance of core stability to decrease back pain; log roll technique   Person(s) Educated Patient   Methods Explanation;Demonstration   Comprehension Verbalized understanding;Returned demonstration          PT Short Term Goals - 06/02/16 1754    PT SHORT TERM GOAL #1   Title Patient to report LBP no more than 2/10 with functional task performance in order to demonstrate improving general function and mobility    Time 3   Period Weeks   Status New   PT SHORT TERM GOAL #2   Title Patient to report R knee pain no more than 1/10 with all functional tasks in order to demonstrate general improvement and task performance    Time 3   Period Weeks   Status New   PT SHORT TERM GOAL #3   Title Patient to demonstrate at least a 50% reduction in muscle flexibiltiy in order to reduce pain, improve overall posture, and improve functional performance    Time 3   Period Weeks   Status New   PT SHORT TERM GOAL #4   Title Patient to correctly and consistently perform appropriate HEP, to be updated PRN    Time 3   Period Weeks   Status New           PT Long Term Goals - 06/02/16 1756    PT LONG  TERM GOAL #1   Title Patient to demonstrate strength 5/5 in all tested muscle groups in order to assist in maintaining correct functional posture and reducing pain    Time 6   Period Weeks  Status New   PT LONG TERM GOAL #2   Title Patient to maintain correct functional posture at least 80% of the time during functional task performance in order to reduce pain and improve mechanics    Time 6   Period Weeks   Status New   PT LONG TERM GOAL #3   Title Patient to demonstrate correct functional lifting mechanics as well as correct log rolling technique with bed mobility  in order to maintain integrity of back and reduce chances of re-injury/pain exacerbation    Time 6   Period Weeks   Status New   PT LONG TERM GOAL #4   Title Patient to be participatory in regular exercise program, at least 3 days per week at least 20 minutes in duration, in order to maintain functional gains and assist in maintaining improved general health status    Time 6   Period Weeks   Status New               Plan - 06/09/16 1820    Clinical Impression Statement Today's session focused on therex to address core stability and endurance. Pt with good technique during basic abdominal bracing therex, however he had increased difficulty when dynamic LE movements were added, requiring increased verbal cues to maintain proper technique. PT also noticing increased pain/difficulty with transitions sit to/from supine and pt was educated on proper log roll technique to decreased strain to his spine. Pt able to correctly perform by the end of today's session. Will continue with current POC.   Rehab Potential Good   PT Frequency 2x / week   PT Duration 6 weeks   PT Treatment/Interventions ADLs/Self Care Home Management;Cryotherapy;Moist Heat;Gait training;Stair training;Functional mobility training;Therapeutic activities;Therapeutic exercise;Balance training;Neuromuscular re-education;Patient/family education;Manual  techniques;Scar mobilization;Passive range of motion;Energy conservation;Taping   PT Next Visit Plan trunk stabilization: UE/LE movements with ab set; Continue with functional stretching for low back and R knee, functional strengthening and postural training    PT Home Exercise Plan updated with ab set with alt heel taps, sit to stand, quad/hip flexor stretch and hamstring stretch   Consulted and Agree with Plan of Care Patient      Patient will benefit from skilled therapeutic intervention in order to improve the following deficits and impairments:  Abnormal gait, Decreased skin integrity, Decreased scar mobility, Increased edema, Decreased activity tolerance, Decreased strength, Pain, Improper body mechanics, Decreased coordination, Impaired flexibility, Postural dysfunction  Visit Diagnosis: Right low back pain, with sciatica presence unspecified  Pain in right knee  Muscle weakness (generalized)  Abnormal posture     Problem List Patient Active Problem List   Diagnosis Date Noted  . Cough variant asthma 02/12/2016  . Upper airway cough syndrome 01/24/2016  . 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 Hypertension   . Atrial fibrillation ([place])   . Syncope   . Hyperthyroidism   . Hyperlipidemia   . RBBB (right bundle branch block)   . Tricuspid valve disorder   . Aortic valve disorder   . Carotid artery stenosis   . Diabetes ([place])   . Pacemaker   . OSA (obstructive sleep apnea)   . Arthritis   . Sinoatrial node dysfunction (HCC)   . PVC's (premature ventricular contractions) 07/17/2011  . Essential hypertension, benign 01/24/2011  . PREMATURE VENTRICULAR CONTRACTIONS 01/24/2011  . PPM-St.Jude 01/24/2011    Elly Modena 06/09/2016, 6:26 PM  [place] [address] [place], [place], [address] Phone: [phone]   Fax:  [phone]  Name: Clarence Dawson MRN: [id number] Date of Birth: Jan 20, 1946

## 2016-06-09 NOTE — Progress Notes (Signed)
Remote pacemaker transmission.   

## 2016-06-11 ENCOUNTER — Ambulatory Visit: Payer: Medicare Other | Admitting: Internal Medicine

## 2016-06-11 ENCOUNTER — Encounter (HOSPITAL_COMMUNITY): Payer: Medicare Other

## 2016-06-12 ENCOUNTER — Ambulatory Visit (HOSPITAL_COMMUNITY): Payer: Medicare Other | Admitting: Physical Therapy

## 2016-06-12 DIAGNOSIS — M6281 Muscle weakness (generalized): Secondary | ICD-10-CM | POA: Diagnosis not present

## 2016-06-12 DIAGNOSIS — M545 Low back pain: Secondary | ICD-10-CM

## 2016-06-12 DIAGNOSIS — M25561 Pain in right knee: Secondary | ICD-10-CM

## 2016-06-12 DIAGNOSIS — L570 Actinic keratosis: Secondary | ICD-10-CM | POA: Diagnosis not present

## 2016-06-12 DIAGNOSIS — C4441 Basal cell carcinoma of skin of scalp and neck: Secondary | ICD-10-CM | POA: Diagnosis not present

## 2016-06-12 DIAGNOSIS — D225 Melanocytic nevi of trunk: Secondary | ICD-10-CM | POA: Diagnosis not present

## 2016-06-12 DIAGNOSIS — R293 Abnormal posture: Secondary | ICD-10-CM | POA: Diagnosis not present

## 2016-06-12 DIAGNOSIS — L821 Other seborrheic keratosis: Secondary | ICD-10-CM | POA: Diagnosis not present

## 2016-06-12 DIAGNOSIS — Z08 Encounter for follow-up examination after completed treatment for malignant neoplasm: Secondary | ICD-10-CM | POA: Diagnosis not present

## 2016-06-12 DIAGNOSIS — X32XXXD Exposure to sunlight, subsequent encounter: Secondary | ICD-10-CM | POA: Diagnosis not present

## 2016-06-12 DIAGNOSIS — Z85828 Personal history of other malignant neoplasm of skin: Secondary | ICD-10-CM | POA: Diagnosis not present

## 2016-06-12 NOTE — Therapy (Signed)
[place], [place], [address] Phone: [phone]   Fax:  [phone]  Physical Therapy Treatment  Patient Details  Name: Clarence Dawson MRN: [id number] Date of Birth: August 18, 1946 Referring Provider: Elsie Saas   Encounter Date: 06/12/2016      PT End of Session - 06/12/16 1737    Visit Number 4   Number of Visits 12   Date for PT Re-Evaluation 06/23/16   Authorization Type Medicare/Cigna    Authorization Time Period 06/02/16 to 07/14/16   Authorization - Visit Number 4   Authorization - Number of Visits 10   PT Start Time 1884   PT Stop Time 1728   PT Time Calculation (min) 39 min   Activity Tolerance Patient tolerated treatment well   Behavior During Therapy [facility] for tasks assessed/performed      Past Medical History  Diagnosis Date  . Hypertension   . Allergic rhinitis   . Atrial fibrillation ([place])   . Syncope   . Sinoatrial node dysfunction (HCC)   . 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 Hyperlipidemia   . RBBB (right bundle branch block)     Coronary Artherosclerosis  . Tricuspid valve disorder   . Aortic valve disorder   . Carotid artery stenosis   . Diabetes ([place])   . Hemorrhage of rectum   . Pacemaker     Oct 2005 in [place].  . Asthma     since childhood- seasonal allergies induced  . Arthritis     ostoearthritis  . Sinoatrial node dysfunction (HCC)   . Hypothyroidism   . GERD (gastroesophageal reflux disease)   . H/O hiatal hernia   . OSA (obstructive sleep apnea)     done in [place] 2005- place no longer in business-does not use  . Full dentures     Past Surgical History  Procedure Laterality Date  . Gallbladder surgery  12/01/2006  . Rotator cuff surgery  2001    Right shoulder  . Carpal tunnel release  1994    right wrist  . Arthropathy  2005    Rebuilding of left thumb and joint   . Hemorroidectomy  2003  . Circumcision    . A-v cardiac pacemaker insertion      Sick sinus syndrome DDR  pacer  . Cardiac electrophysiology study and ablation  09/2008    for pvcs, Dr. Loralie Champagne  . Left knee arthroscopy      [facility] 21 [facility]- [facility]  . Carpal tunnel release  05/04/2012    Procedure: CARPAL TUNNEL RELEASE;  Surgeon: Wynonia Sours, MD;  Location: [facility];  Service: Orthopedics;  Laterality: Left;  . Cardiac catheterization    . 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 Tonsillectomy    . Cholecystectomy  1994  . Partial knee arthroplasty  11/22/2012    Procedure: UNICOMPARTMENTAL KNEE;  Surgeon: Lorn Junes, MD;  Location: [facility];  Service: Orthopedics;  Laterality: Left;  left unicompartmental knee arthroplasty  . Hand tendon surgery Left late 1990's    thumb  . Colonoscopy N/A 03/14/2013    Procedure: COLONOSCOPY;  Surgeon: Daneil Dolin, MD;  [facility];  Service: Endoscopy;  Laterality: N/A;  8:15 AM  . Carpometacarpel suspension plasty Right 11/16/2014    Procedure: SUSPENSIONPLASTY RIGHT THUMB TENDON TRANSFER ABDUCTOR POLLICUS LONGUS EXCISION  TRAPEZIUM;  Surgeon: Daryll Brod, MD;  Location: [facility];  Service: Orthopedics;  Laterality: Right;  . Permanent pacemaker generator change N/A 01/12/2013    Procedure: PERMANENT PACEMAKER GENERATOR CHANGE;  Surgeon: Evans Lance, MD;  Location: [facility] CATH LAB;  Service: Cardiovascular;  Laterality: N/A;    There were no vitals filed for this visit.      Subjective Assessment - 06/12/16 1658    Subjective Patient arrives reporting that he is really not having a good day; had some work done at dermatologist and his back is really bothering him, it feels like like it was before he had the injections.    Pertinent History beta blockers, Xarelto, large cardiac history with pacemaker installed, diabetes, L parital knee replacement    Patient Stated Goals get back to PLOF   Currently in  Pain? Yes   Pain Score 4    Pain Location Back   Pain Orientation Right   Pain Descriptors / Indicators Sharp;Stabbing   Pain Type Chronic pain;Acute pain   Pain Radiating Towards none    Pain Onset More than a month ago   Pain Frequency Constant   Aggravating Factors  bent posture    Pain Relieving Factors extended postures   Effect of Pain on Daily Activities pain                          [facility] Adult PT Treatment/Exercise - 06/12/16 0001    Lumbar Exercises: Stretches   Active Hamstring Stretch 3 reps;30 seconds   Active Hamstring Stretch Limitations 12 inch box    Lower Trunk Rotation 5 reps;10 seconds   Standing Extension Other (comment)   Standing Extension Limitations --  attempted, painful    Press Ups Other (comment)   Press Ups Limitations x15 to elbows  attempted full UE extension, pain    Piriformis Stretch 30 seconds;3 reps   Piriformis Stretch Limitations seated    Lumbar Exercises: Standing   Other Standing Lumbar Exercises 3D hip excurions 1x15; seated LE flexion on swiss ball    Other Standing Lumbar Exercises standing hip/knee flexion with core set;, limited flexion ROM   Lumbar Exercises: Supine   Bridge 10 reps                PT Education - 06/12/16 1737    Education provided Yes   Education Details importance of posture and core activation in reducing back pain    Person(s) Educated Patient   Methods Explanation   Comprehension Verbalized understanding          PT Short Term Goals - 06/02/16 1754    PT SHORT TERM GOAL #1   Title Patient to report LBP no more than 2/10 with functional task performance in order to demonstrate improving general function and mobility    Time 3   Period Weeks   Status New   PT SHORT TERM GOAL #2   Title Patient to report R knee pain no more than 1/10 with all functional tasks in order to demonstrate general improvement and task performance    Time 3   Period Weeks   Status New   PT SHORT  TERM GOAL #3   Title Patient to demonstrate at least a 50% reduction in muscle flexibiltiy in order to reduce pain, improve overall posture, and improve functional performance    Time 3   Period Weeks   Status New   PT SHORT TERM GOAL #  4   Title Patient to correctly and consistently perform appropriate HEP, to be updated PRN    Time 3   Period Weeks   Status New           PT Long Term Goals - 06/02/16 1756    PT LONG TERM GOAL #1   Title Patient to demonstrate strength 5/5 in all tested muscle groups in order to assist in maintaining correct functional posture and reducing pain    Time 6   Period Weeks   Status New   PT LONG TERM GOAL #2   Title Patient to maintain correct functional posture at least 80% of the time during functional task performance in order to reduce pain and improve mechanics    Time 6   Period Weeks   Status New   PT LONG TERM GOAL #3   Title Patient to demonstrate correct functional lifting mechanics as well as correct log rolling technique with bed mobility  in order to maintain integrity of back and reduce chances of re-injury/pain exacerbation    Time 6   Period Weeks   Status New   PT LONG TERM GOAL #4   Title Patient to be participatory in regular exercise program, at least 3 days per week at least 20 minutes in duration, in order to maintain functional gains and assist in maintaining improved general health status    Time 6   Period Weeks   Status New               Plan - 06/12/16 1743    Clinical Impression Statement Patient arrives today stating that his back has been bothering him more than usual, it feels like his injections have worn off, and he is requesting that PT focus on his back today. Focused on functional stretching and lumbar mobility today; did correct form for exercises for some of which patient demonstrated form which put excess torsion on knee or low back. Performed functional mobility and stabilization work in all  positions today, with trial of prone to elbows which patient reported assisted pain however did experience pain with prone to UEs/standing extensions. Patient demonstrates poor core activation and poor awareness of core at this time, with difficulty in standing and seated core work today. Patient did however report improvement of overall condition at end of session.    Rehab Potential Good   PT Frequency 2x / week   PT Duration 6 weeks   PT Treatment/Interventions ADLs/Self Care Home Management;Cryotherapy;Moist Heat;Gait training;Stair training;Functional mobility training;Therapeutic activities;Therapeutic exercise;Balance training;Neuromuscular re-education;Patient/family education;Manual techniques;Scar mobilization;Passive range of motion;Energy conservation;Taping   PT Next Visit Plan trunk stabilization: UE/LE movements with ab set; Continue with functional stretching for low back and R knee, functional strengthening and postural training. AVOID flexed positions as much as possible due to retrolisthesis.    Consulted and Agree with Plan of Care Patient      Patient will benefit from skilled therapeutic intervention in order to improve the following deficits and impairments:  Abnormal gait, Decreased skin integrity, Decreased scar mobility, Increased edema, Decreased activity tolerance, Decreased strength, Pain, Improper body mechanics, Decreased coordination, Impaired flexibility, Postural dysfunction  Visit Diagnosis: Right low back pain, with sciatica presence unspecified  Pain in right knee  Muscle weakness (generalized)  Abnormal posture     Problem List Patient Active Problem List   Diagnosis Date Noted  . Cough variant asthma 02/12/2016  . Upper airway cough syndrome 01/24/2016  . 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 Hypertension   .  Atrial fibrillation ([place])   . Syncope   . Hyperthyroidism   . Hyperlipidemia   . RBBB (right bundle branch block)   . Tricuspid valve disorder    . Aortic valve disorder   . Carotid artery stenosis   . Diabetes ([place])   . Pacemaker   . OSA (obstructive sleep apnea)   . Arthritis   . Sinoatrial node dysfunction (HCC)   . PVC's (premature ventricular contractions) 07/17/2011  . Essential hypertension, benign 01/24/2011  . PREMATURE VENTRICULAR CONTRACTIONS 01/24/2011  . 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 [place], [place], [address] Phone: [phone]   Fax:  [phone]  Name: GEORGI NAVARRETE MRN: [id number] Date of Birth: 1946/09/16

## 2016-06-16 ENCOUNTER — Ambulatory Visit (HOSPITAL_COMMUNITY): Payer: Medicare Other

## 2016-06-16 DIAGNOSIS — R293 Abnormal posture: Secondary | ICD-10-CM

## 2016-06-16 DIAGNOSIS — M25561 Pain in right knee: Secondary | ICD-10-CM

## 2016-06-16 DIAGNOSIS — M6281 Muscle weakness (generalized): Secondary | ICD-10-CM

## 2016-06-16 DIAGNOSIS — M545 Low back pain: Secondary | ICD-10-CM | POA: Diagnosis not present

## 2016-06-16 LAB — CUP PACEART REMOTE DEVICE CHECK
Brady Statistic AP VP Percent: 1 %
Brady Statistic AP VS Percent: 87 %
Brady Statistic AS VS Percent: 12 %
Brady Statistic RV Percent Paced: 4.6 %
Date Time Interrogation Session: 20170612101948
Implantable Lead Location: 753859
Lead Channel Impedance Value: 380 Ohm
Lead Channel Pacing Threshold Amplitude: 0.75 V
Lead Channel Pacing Threshold Pulse Width: 0.4 ms
Lead Channel Setting Pacing Amplitude: 2 V
Lead Channel Setting Sensing Sensitivity: 2 mV
MDC IDC LEAD IMPLANT DT: 20051106
MDC IDC LEAD IMPLANT DT: 20051106
MDC IDC LEAD LOCATION: 753860
MDC IDC MSMT BATTERY REMAINING LONGEVITY: 122 mo
MDC IDC MSMT BATTERY REMAINING PERCENTAGE: 95.5 %
MDC IDC MSMT BATTERY VOLTAGE: 2.98 V
MDC IDC MSMT LEADCHNL RV IMPEDANCE VALUE: 640 Ohm
MDC IDC PG SERIAL: 7439597
MDC IDC SET LEADCHNL RV PACING AMPLITUDE: 1 V
MDC IDC SET LEADCHNL RV PACING PULSEWIDTH: 0.4 ms
MDC IDC STAT BRADY AS VP PERCENT: 1 %
MDC IDC STAT BRADY RA PERCENT PACED: 67 %
Pulse Gen Model: 2210

## 2016-06-16 NOTE — Therapy (Signed)
[facility] [address] [place], [place], [address] Phone: [phone]   Fax:  [phone]  Physical Therapy Treatment  Patient Details  Name: Clarence Dawson MRN: [id number] Date of Birth: 1946-04-23 Referring Provider: Elsie Saas  Encounter Date: 06/16/2016      PT End of Session - 06/16/16 0956    Visit Number 5   Number of Visits 12   Date for PT Re-Evaluation 06/23/16   Authorization Type Medicare/[employer]    Authorization Time Period 06/02/16 to 07/14/16   Authorization - Visit Number 5   Authorization - Number of Visits 10   PT Start Time 0945   PT Stop Time 1030   PT Time Calculation (min) 45 min   Activity Tolerance Patient tolerated treatment well   Behavior During Therapy [facility] for tasks assessed/performed      Past Medical History  Diagnosis Date  . Hypertension   . Allergic rhinitis   . Atrial fibrillation ([place])   . Syncope   . Sinoatrial node dysfunction (HCC)   . 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 Hyperlipidemia   . RBBB (right bundle branch block)     Coronary Artherosclerosis  . Tricuspid valve disorder   . Aortic valve disorder   . Carotid artery stenosis   . Diabetes ([place])   . Hemorrhage of rectum   . Pacemaker     Oct 2005 in [place].  . Asthma     since childhood- seasonal allergies induced  . Arthritis     ostoearthritis  . Sinoatrial node dysfunction (HCC)   . Hypothyroidism   . GERD (gastroesophageal reflux disease)   . H/O hiatal hernia   . OSA (obstructive sleep apnea)     done in [place] 2005- place no longer in business-does not use  . Full dentures     Past Surgical History  Procedure Laterality Date  . Gallbladder surgery  12/01/2006  . Rotator cuff surgery  2001    Right shoulder  . Carpal tunnel release  1994    right wrist  . Arthropathy  2005    Rebuilding of left thumb and joint   . Hemorroidectomy  2003  . Circumcision    . A-v cardiac pacemaker insertion      Sick sinus syndrome DDR pacer   . Cardiac electrophysiology study and ablation  09/2008    for pvcs, Dr. Loralie Champagne  . Left knee arthroscopy      [facility]  . Carpal tunnel release  05/04/2012    Procedure: CARPAL TUNNEL RELEASE;  Surgeon: Wynonia Sours, MD;  Location: [place];  Service: Orthopedics;  Laterality: Left;  . Cardiac catheterization    . 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 Tonsillectomy    . Cholecystectomy  1994  . Partial knee arthroplasty  11/22/2012    Procedure: UNICOMPARTMENTAL KNEE;  Surgeon: Lorn Junes, MD;  Location: [place];  Service: Orthopedics;  Laterality: Left;  left unicompartmental knee arthroplasty  . Hand tendon surgery Left late 1990's    thumb  . Colonoscopy N/A 03/14/2013    Procedure: COLONOSCOPY;  Surgeon: Daneil Dolin, MD;  [facility];  Service: Endoscopy;  Laterality: N/A;  8:15 AM  . Carpometacarpel suspension plasty Right 11/16/2014    Procedure: SUSPENSIONPLASTY RIGHT THUMB TENDON TRANSFER ABDUCTOR POLLICUS LONGUS EXCISION TRAPEZIUM;  Surgeon: Daryll Brod, MD;  Location: [place];  Service: Orthopedics;  Laterality: Right;  . Permanent pacemaker generator change N/A 01/12/2013    Procedure: PERMANENT PACEMAKER GENERATOR CHANGE;  Surgeon: Evans Lance, MD;  Location: [facility] CATH LAB;  Service: Cardiovascular;  Laterality: N/A;    There were no vitals filed for this visit.      Subjective Assessment - 06/16/16 0944    Subjective Pt. stated he was tired this morning, had family company over weekend.  Reports main discomfort with back pain in supine position.  Current pain scale 1/10 for Rt knee and 2/10 acheyness LBP.     Pertinent History beta blockers, Xarelto, large cardiac history with pacemaker installed, diabetes, L parital knee replacement    Patient Stated Goals get back to PLOF   Currently in Pain? Yes    Pain Score 2    Pain Location Back   Pain Orientation Mid;Right   Pain Descriptors / Indicators Sharp;Aching  Grabbing   Pain Type Chronic pain   Pain Radiating Towards none   Pain Onset More than a month ago   Pain Frequency Constant   Aggravating Factors  bent posture, laying flat on back   Pain Relieving Factors extended postures   Effect of Pain on Daily Activities Rt LE tired following walking for long periods of time, increased LBP supine position   Pain Score 1   Pain Location Knee   Pain Orientation Right   Pain Descriptors / Indicators Sore   Pain Onset More than a month ago   Pain Frequency Intermittent   Aggravating Factors  twisted   Pain Relieving Factors ice, elevation   Effect of Pain on Daily Activities minimal effects of ADLs            [facility] PT Assessment - 06/16/16 0001    Assessment   Medical Diagnosis R knee scope, DDD with R radiculopathy    Referring Provider Elsie Saas   Onset Date/Surgical Date 05/21/16   Next MD Visit Noemi Chapel on June 22nd    Strength   Right Hip Flexion 5/5  was 5/5   Right Hip Extension 4/5  was 4/5   Right Hip ABduction 4+/5  was 4/5   Left Hip Flexion 5/5   Left Hip Extension --  was 4/5   Left Hip ABduction 4+/5  was 4/5   Right Knee Flexion 4+/5   Left Knee Flexion 5/5   Left Knee Extension 5/5   Right Ankle Dorsiflexion 5/5   Left Ankle Dorsiflexion 5/5   Flexibility   Hamstrings moderate tightness R, mild tightness Lt   ITB moderate tightness R    Piriformis mild tightness B              [facility] Adult PT Treatment/Exercise - 06/16/16 0001    Lumbar Exercises: Stretches   Active Hamstring Stretch 3 reps;30 seconds   Active Hamstring Stretch Limitations 12 inch box    Piriformis Stretch 30 seconds;3 reps   Piriformis Stretch Limitations seated    Lumbar Exercises: Standing   Scapular Retraction 10 reps;Theraband   Theraband Level (Scapular Retraction) Level 3 (Green)   Row 15 reps;Theraband    Theraband Level (Row) Level 3 (Green)   Shoulder Extension 15 reps;Theraband   Theraband Level (Shoulder Extension) Level 3 (Green)   Other Standing Lumbar Exercises 3D hip excurions 1x15; seated LE flexion on swiss ball    Other Standing Lumbar Exercises standing infront of door with posture strengthening, tactile cueing  for ab sets to reduce increased lordosis   Lumbar Exercises: Seated   Other Seated Lumbar Exercises UE punches with red TB resisting rotation x20 each   Other Seated Lumbar Exercises UE elevation with green TB while resisting rotation x20 each side            PT Short Term Goals - 06/16/16 0956    PT SHORT TERM GOAL #1   Title Patient to report LBP no more than 2/10 with functional task performance in order to demonstrate improving general function and mobility    Baseline 06/16/2016:  Average LBP scale 1-2/10, increased pain with laying flat on back   Status On-going   PT SHORT TERM GOAL #2   Title Patient to report R knee pain no more than 1/10 with all functional tasks in order to demonstrate general improvement and task performance    Baseline 06/16/2016: Rt knee average pain scale 1/10, as long as doesn't twist knee   Status On-going   PT SHORT TERM GOAL #3   Title Patient to demonstrate at least a 50% reduction in muscle flexibiltiy in order to reduce pain, improve overall posture, and improve functional performance    Status On-going   PT SHORT TERM GOAL #4   Title Patient to correctly and consistently perform appropriate HEP, to be updated PRN    Baseline 06/16/2016:  Reports compliance twice daily   Status Achieved           PT Long Term Goals - 06/16/16 0959    PT LONG TERM GOAL #1   Title Patient to demonstrate strength 5/5 in all tested muscle groups in order to assist in maintaining correct functional posture and reducing pain    Baseline 06/16/2106: improving, continues to demonstrate weak core, improving LE strengthening   Status On-going    PT LONG TERM GOAL #2   Title Patient to maintain correct functional posture at least 80% of the time during functional task performance in order to reduce pain and improve mechanics    Baseline 06/16/2106:  Continues to require cueing to improve posture   Status On-going   PT LONG TERM GOAL #3   Title Patient to demonstrate correct functional lifting mechanics as well as correct log rolling technique with bed mobility  in order to maintain integrity of back and reduce chances of re-injury/pain exacerbation    Status On-going   PT LONG TERM GOAL #4   Title Patient to be participatory in regular exercise program, at least 3 days per week at least 20 minutes in duration, in order to maintain functional gains and assist in maintaining improved general health status    Status On-going               Plan - 06/16/16 1258    Clinical Impression Statement Session focus on improving trunk stabilization, began posture strengthening and LE mobility.  Reviewed goals prior MD apt this Thursday with the following findings:  Pt reports compliancei with HEP and able to demonstrate/verbalize appropraite technqiue with exercises.  Pt does continue to require min verbal cueing and demonstration with appropriate technqiue with log rolling.  Pt reports pain scale low wtih average 1/10 Rt knee and 1-2/10 for Rt sided LBP.  Pt reports increased Rt sided LBP pain with supine position.  MMT complete with improvements all LE musculture, pt does continue to demonstrate weak core musculature and requires cueing for posture to reudce forward head and rollled shoulders.  Added theraband postural strengthening this session  with min cueing for form and technique.  Will continue per PT POC to address goals unmet.     Rehab Potential Good   PT Frequency 2x / week   PT Duration 6 weeks   PT Treatment/Interventions ADLs/Self Care Home Management;Cryotherapy;Moist Heat;Gait training;Stair training;Functional mobility  training;Therapeutic activities;Therapeutic exercise;Balance training;Neuromuscular re-education;Patient/family education;Manual techniques;Scar mobilization;Passive range of motion;Energy conservation;Taping   PT Next Visit Plan trunk stabilization: UE/LE movements with ab set; Continue with functional stretching for low back and R knee, functional strengthening and postural training. AVOID flexed positions as much as possible due to retrolisthesis.       Patient will benefit from skilled therapeutic intervention in order to improve the following deficits and impairments:  Abnormal gait, Decreased skin integrity, Decreased scar mobility, Increased edema, Decreased activity tolerance, Decreased strength, Pain, Improper body mechanics, Decreased coordination, Impaired flexibility, Postural dysfunction  Visit Diagnosis: Right low back pain, with sciatica presence unspecified  Pain in right knee  Muscle weakness (generalized)  Abnormal posture     Problem List Patient Active Problem List   Diagnosis Date Noted  . Cough variant asthma 02/12/2016  . Upper airway cough syndrome 01/24/2016  . 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 Hypertension   . Atrial fibrillation ([place])   . Syncope   . Hyperthyroidism   . Hyperlipidemia   . RBBB (right bundle branch block)   . Tricuspid valve disorder   . Aortic valve disorder   . Carotid artery stenosis   . Diabetes ([place])   . Pacemaker   . OSA (obstructive sleep apnea)   . Arthritis   . Sinoatrial node dysfunction (HCC)   . PVC's (premature ventricular contractions) 07/17/2011  . Essential hypertension, benign 01/24/2011  . PREMATURE VENTRICULAR CONTRACTIONS 01/24/2011  . PPM-St.Jude 01/24/2011   Ihor Austin, [place]; [place]  Aldona Lento 06/16/2016, 2:24 PM  [facility] [address] [place], [place], [address] Phone: [phone]   Fax:  [phone]  Name: Clarence Dawson MRN:  [id number] Date of Birth: 1946-10-30

## 2016-06-18 ENCOUNTER — Encounter (HOSPITAL_COMMUNITY): Payer: Medicare Other

## 2016-06-19 ENCOUNTER — Encounter: Payer: Self-pay | Admitting: Cardiology

## 2016-06-19 ENCOUNTER — Ambulatory Visit (HOSPITAL_COMMUNITY): Payer: Medicare Other

## 2016-06-19 DIAGNOSIS — M545 Low back pain: Secondary | ICD-10-CM

## 2016-06-19 DIAGNOSIS — S83241D Other tear of medial meniscus, current injury, right knee, subsequent encounter: Secondary | ICD-10-CM | POA: Diagnosis not present

## 2016-06-19 DIAGNOSIS — M25561 Pain in right knee: Secondary | ICD-10-CM | POA: Diagnosis not present

## 2016-06-19 DIAGNOSIS — S83281D Other tear of lateral meniscus, current injury, right knee, subsequent encounter: Secondary | ICD-10-CM | POA: Diagnosis not present

## 2016-06-19 DIAGNOSIS — M6281 Muscle weakness (generalized): Secondary | ICD-10-CM | POA: Diagnosis not present

## 2016-06-19 DIAGNOSIS — R293 Abnormal posture: Secondary | ICD-10-CM | POA: Diagnosis not present

## 2016-06-19 NOTE — Therapy (Signed)
[facility] [place], [place], [address] Phone: [phone]   Fax:  [phone]  Physical Therapy Treatment  Patient Details  Name: SUFIAN RAVI MRN: [id number] Date of Birth: Jan 09, 1946 Referring Provider: Elsie Saas  Encounter Date: 06/19/2016      PT End of Session - 06/19/16 1740    Visit Number 6   Number of Visits 12   Date for PT Re-Evaluation 06/23/16   Authorization Type Medicare/Cign[employer]    Authorization Time Period 06/02/16 to 07/14/16   Authorization - Visit Number 6   Authorization - Number of Visits 10   PT Start Time 7510   PT Stop Time 1812   PT Time Calculation (min) 38 min   Activity Tolerance Patient tolerated treatment well   Behavior During Therapy [facility] for tasks assessed/performed      Past Medical History  Diagnosis Date  . Hypertension   . Allergic rhinitis   . Atrial fibrillation ([place])   . Syncope   . Sinoatrial node dysfunction (HCC)   . 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 Hyperlipidemia   . RBBB (right bundle branch block)     Coronary Artherosclerosis  . Tricuspid valve disorder   . Aortic valve disorder   . Carotid artery stenosis   . Diabetes ([place])   . Hemorrhage of rectum   . Pacemaker     Oct 2005 in [place].  . Asthma     since childhood- seasonal allergies induced  . Arthritis     ostoearthritis  . Sinoatrial node dysfunction (HCC)   . Hypothyroidism   . GERD (gastroesophageal reflux disease)   . H/O hiatal hernia   . OSA (obstructive sleep apnea)     done in [place] 2005- place no longer in business-does not use  . Full dentures     Past Surgical History  Procedure Laterality Date  . Gallbladder surgery  12/01/2006  . Rotator cuff surgery  2001    Right shoulder  . Carpal tunnel release  1994    right wrist  . Arthropathy  2005    Rebuilding of left thumb and joint   . Hemorroidectomy  2003  . Circumcision    . A-v cardiac pacemaker insertion      Sick sinus syndrome DDR pacer   . Cardiac electrophysiology study and ablation  09/2008    for pvcs, Dr. Loralie Champagne  . Left knee arthroscopy      April 21 2011- [facility] center  . Carpal tunnel release  05/04/2012    Procedure: CARPAL TUNNEL RELEASE;  Surgeon: Wynonia Sours, MD;  Location: [facility];  Service: Orthopedics;  Laterality: Left;  . Cardiac catheterization    . 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 Tonsillectomy    . Cholecystectomy  1994  . Partial knee arthroplasty  11/22/2012    Procedure: UNICOMPARTMENTAL KNEE;  Surgeon: Lorn Junes, MD;  Location: [place];  Service: Orthopedics;  Laterality: Left;  left unicompartmental knee arthroplasty  . Hand tendon surgery Left late 1990's    thumb  . Colonoscopy N/A 03/14/2013    Procedure: COLONOSCOPY;  Surgeon: Daneil Dolin, MD;  [facility];  Service: Endoscopy;  Laterality: N/A;  8:15 AM  . Carpometacarpel suspension plasty Right 11/16/2014    Procedure: SUSPENSIONPLASTY RIGHT THUMB TENDON TRANSFER ABDUCTOR POLLICUS LONGUS EXCISION TRAPEZIUM;  Surgeon: Daryll Brod, MD;  Location: [place];  Service: Orthopedics;  Laterality: Right;  . Permanent pacemaker generator change N/A 01/12/2013    Procedure: PERMANENT PACEMAKER GENERATOR CHANGE;  Surgeon: Evans Lance, MD;  Location: [facility] CATH LAB;  Service: Cardiovascular;  Laterality: N/A;    There were no vitals filed for this visit.      Subjective Assessment - 06/19/16 1737    Subjective Pt stated he went to MD today, MD happy with progress.  No reports of pain today.   Pertinent History beta blockers, Xarelto, large cardiac history with pacemaker installed, diabetes, L parital knee replacement    Patient Stated Goals get back to PLOF   Currently in Pain? No/denies             [facility] Adult PT Treatment/Exercise - 06/19/16 0001    Lumbar Exercises: Standing    Scapular Retraction 15 reps   Theraband Level (Scapular Retraction) Level 3 (Green)   Row 15 reps;Theraband   Theraband Level (Row) Level 3 (Green)   Shoulder Extension 15 reps;Theraband   Theraband Level (Shoulder Extension) Level 3 (Green)   Other Standing Lumbar Exercises 3D hip excurions 1x15; seated LE flexion on swiss ball    Other Standing Lumbar Exercises standing infront of door with posture strengthening, tactile cueing for ab sets to reduce increased lordosis   Lumbar Exercises: Seated   Other Seated Lumbar Exercises UE punches with red TB resisting rotation x20 each standing   Other Seated Lumbar Exercises UE elevation with green TB while resisting rotation x20 each side standing   Lumbar Exercises: Supine   Bent Knee Raise 15 reps   Bent Knee Raise Limitations with UE press down            PT Short Term Goals - 06/16/16 0956    PT SHORT TERM GOAL #1   Title Patient to report LBP no more than 2/10 with functional task performance in order to demonstrate improving general function and mobility    Baseline 06/16/2016:  Average LBP scale 1-2/10, increased pain with laying flat on back   Status On-going   PT SHORT TERM GOAL #2   Title Patient to report R knee pain no more than 1/10 with all functional tasks in order to demonstrate general improvement and task performance    Baseline 06/16/2016: Rt knee average pain scale 1/10, as long as doesn't twist knee   Status On-going   PT SHORT TERM GOAL #3   Title Patient to demonstrate at least a 50% reduction in muscle flexibiltiy in order to reduce pain, improve overall posture, and improve functional performance    Status On-going   PT SHORT TERM GOAL #4   Title Patient to correctly and consistently perform appropriate HEP, to be updated PRN    Baseline 06/16/2016:  Reports compliance twice daily   Status Achieved           PT Long Term Goals - 06/16/16 0959    PT LONG TERM GOAL #1   Title Patient to demonstrate  strength 5/5 in all tested muscle groups in order to assist in maintaining correct functional posture and reducing pain    Baseline 06/16/2106: improving, continues to demonstrate weak core, improving LE strengthening   Status On-going   PT LONG TERM GOAL #2   Title Patient to maintain correct functional posture at least 80% of the time during functional task performance in order to reduce pain and improve mechanics    Baseline  06/16/2106:  Continues to require cueing to improve posture   Status On-going   PT LONG TERM GOAL #3   Title Patient to demonstrate correct functional lifting mechanics as well as correct log rolling technique with bed mobility  in order to maintain integrity of back and reduce chances of re-injury/pain exacerbation    Status On-going   PT LONG TERM GOAL #4   Title Patient to be participatory in regular exercise program, at least 3 days per week at least 20 minutes in duration, in order to maintain functional gains and assist in maintaining improved general health status    Status On-going               Plan - 06/19/16 2146    Clinical Impression Statement Session focus on improving trunk stabilization and posture strengthening.  Therapist facilitation to improve stabiltiy with majority of exercises.  Pt stated increased discomfort in supine position with increased LBP at 1/10 at EOS.     Rehab Potential Good   PT Frequency 2x / week   PT Duration 6 weeks   PT Treatment/Interventions ADLs/Self Care Home Management;Cryotherapy;Moist Heat;Gait training;Stair training;Functional mobility training;Therapeutic activities;Therapeutic exercise;Balance training;Neuromuscular re-education;Patient/family education;Manual techniques;Scar mobilization;Passive range of motion;Energy conservation;Taping   PT Next Visit Plan trunk stabilization: UE/LE movements with ab set; Continue with functional stretching for low back and R knee, functional strengthening and postural  training. AVOID flexed positions as much as possible due to retrolisthesis.       Patient will benefit from skilled therapeutic intervention in order to improve the following deficits and impairments:  Abnormal gait, Decreased skin integrity, Decreased scar mobility, Increased edema, Decreased activity tolerance, Decreased strength, Pain, Improper body mechanics, Decreased coordination, Impaired flexibility, Postural dysfunction  Visit Diagnosis: Right low back pain, with sciatica presence unspecified  Pain in right knee  Muscle weakness (generalized)  Abnormal posture     Problem List Patient Active Problem List   Diagnosis Date Noted  . Cough variant asthma 02/12/2016  . Upper airway cough syndrome 01/24/2016  . 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 Hypertension   . Atrial fibrillation ([place])   . Syncope   . Hyperthyroidism   . Hyperlipidemia   . RBBB (right bundle branch block)   . Tricuspid valve disorder   . Aortic valve disorder   . Carotid artery stenosis   . Diabetes ([place])   . Pacemaker   . OSA (obstructive sleep apnea)   . Arthritis   . Sinoatrial node dysfunction (HCC)   . PVC's (premature ventricular contractions) 07/17/2011  . Essential hypertension, benign 01/24/2011  . PREMATURE VENTRICULAR CONTRACTIONS 01/24/2011  . PPM-[facility]ude 01/24/2011   Ihor Austin, [place]; [place]   Aldona Lento 06/19/2016, 9:53 PM  [facility] [address] [place], [place], [address] Phone: [phone]   Fax:  [phone]  Name: NASIM HABEEB MRN: [id number] Date of Birth: 07/24/1946

## 2016-06-23 ENCOUNTER — Telehealth (HOSPITAL_COMMUNITY): Payer: Self-pay | Admitting: Physical Therapy

## 2016-06-23 ENCOUNTER — Ambulatory Visit (HOSPITAL_COMMUNITY): Payer: Medicare Other | Admitting: Physical Therapy

## 2016-06-23 ENCOUNTER — Encounter: Payer: Self-pay | Admitting: Internal Medicine

## 2016-06-23 ENCOUNTER — Ambulatory Visit (INDEPENDENT_AMBULATORY_CARE_PROVIDER_SITE_OTHER): Payer: Medicare Other | Admitting: Internal Medicine

## 2016-06-23 ENCOUNTER — Ambulatory Visit (INDEPENDENT_AMBULATORY_CARE_PROVIDER_SITE_OTHER)
Admission: RE | Admit: 2016-06-23 | Discharge: 2016-06-23 | Disposition: A | Discharge status: Home / Self Care | Payer: Medicare Other | Source: Ambulatory Visit | Attending: Internal Medicine | Admitting: Internal Medicine

## 2016-06-23 VITALS — BP 110/68 | HR 60 | Ht 69.0 in | Wt 241.0 lb

## 2016-06-23 DIAGNOSIS — J45991 Cough variant asthma: Secondary | ICD-10-CM

## 2016-06-23 DIAGNOSIS — I10 Essential (primary) hypertension: Secondary | ICD-10-CM

## 2016-06-23 DIAGNOSIS — R058 Other specified cough: Secondary | ICD-10-CM

## 2016-06-23 DIAGNOSIS — R05 Cough: Secondary | ICD-10-CM

## 2016-06-23 LAB — NITRIC OXIDE: Nitric Oxide: 75

## 2016-06-23 MED ORDER — BUDESONIDE-FORMOTEROL FUMARATE 160-4.5 MCG/ACT IN AERO: INHALATION_SPRAY | RESPIRATORY_TRACT | Status: DC

## 2016-06-23 NOTE — Progress Notes (Signed)
Quick Note:  Spoke with pt and notified of results per Dr. Wert. Pt verbalized understanding and denied any questions.  ______ 

## 2016-06-23 NOTE — Telephone Encounter (Signed)
He is not feeling well and will not be here today

## 2016-06-23 NOTE — Progress Notes (Signed)
Subjective:    Patient ID: Clarence Dawson, male    DOB: 04-20-1946,     MRN: [id number]    Brief patient profile:  19 yowm quit smoking 1985 onset of breathing problems mid 2000s rx advair and helped a lot until around spring 2016 with cough varaiably productive not responding to abx x 2 / antiviral and referred to pulmonary clinic 01/24/2016 by Dr Willey Blade for refractory cough.   Brief patient profile:  01/24/2016 1st [facility] office visit/ Nyla Creason  maint rx = advair 250 Chief Complaint  Patient presents with  . PULMONARY CONSULT    Referred by Dr. [name]. Pt c/o cough that is sometimes productive with white-brown thick mucus and some wheeze. Pt denies SOB/CP/tightness. Pt does have history of asthma since childhood. Pt is not on maintenance inhalers.   cough is worse before supper until 10 pm goes to sleep and does fine and then recurs p stirs each am  Not limited by breathing from desired activities   rec Stop lopressor and replace with bystolic 10 mg twice daily  Stop advair > Prednisone 10 mg take  4 each am x 2 days,   2 each am x 2 days,  1 each am x 2 days and stop  Pantoprazole (protonix) 40 mg   Take  30-60 min before first meal of the day and Pepcid (famotidine)  20 mg one @  bedtime until return to office - this is the best way to tell whether stomach acid is contributing to your problem.   GERD diet   02/12/2016  f/u ov/Clarence Dawson re:  Cough variant asthma vs uacs  Chief Complaint  Patient presents with  . Cough    Cough is improved since last OV.   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  Work on inhaler technique:   Ok to change back to [employer] to change back to the ranitidine when you finish up the pantoprazole     03/11/2016  f/u ov/Clarence Dawson re:  Chronic asthma/  symbicort 160 2bid   Chief Complaint  Patient presents with  . Follow-up    Cough had resolved until a  few days ago. He has minimal cough with green sputum.    cough worse in am/ ? Caught cold but notes top dentures don't fit whenever "has a cold" and now green mucus each am  Thinks symbicort much better than advair  rec Plan A = Automatic = Symbicort 80 Take 2 puffs first thing in am and then another 2 puffs about 12 hours later.  Plan B = Backup Only use your albuterol (proventil) as a rescue medication Please see patient coordinator before you leave today  to schedule sinus CT > neg Augmentin 875 mg take one pill twice daily  X 10 days> only took a few then stopped due to diarrhea  Cruise May 19 2016 > cough persisted  Knee surgery May 24/2017      06/23/2016  f/u ov/Clarence Dawson re:  Recurrent cough x one year Chief Complaint  Patient presents with  . Follow-up    Pt states that his cough is sometimes better and sometimes worse. Cough is prod with thick, light brown sputum.   cough is highly variable symbiocrt 80 2 every 12 hours / prn proventil  Tends to be worse p stirring in am for first hours  and one hour before hs  Always better when has been rx'd with prednisone / ? Better on 160 vs 80    No obvious day to day or daytime variability or assoc sob/ need for saba    or cp or chest tightness, subjective wheeze or overt   hb symptoms. No unusual exp hx or h/o childhood pna/ asthma or knowledge of premature birth.  Sleeping ok without nocturnal  or early am exacerbation  of respiratory  c/o's or need for noct saba. Also denies any obvious fluctuation of symptoms with weather or environmental changes or other aggravating or alleviating factors except as outlined above   Current Medications, Allergies, Complete Past Medical History, Past Surgical History, Family History, and Social History were reviewed in [employer] record.  ROS  The following are not active complaints unless bolded sore throat, dysphagia, dental problems, itching, sneezing,  nasal congestion or  excess/ purulent secretions, ear ache,   fever, chills, sweats, unintended wt loss, classically pleuritic or exertional cp, hemoptysis,  orthopnea pnd or leg swelling, presyncope, palpitations, abdominal pain, anorexia, nausea, vomiting, diarrhea  or change in bowel or bladder habits, change in stools or urine, dysuria,hematuria,  rash, arthralgias, visual complaints, headache, numbness, weakness or ataxia or problems with walking or coordination,  change in mood/affect or memory.                Objective:   Physical Exam  Obese   amb wm nad min hoarse   02/12/2016        241 > 03/11/2016 243 > 06/23/2016  241     01/24/16 237 lb (107.502 kg)  02/16/15 240 lb (108.863 kg)  11/16/14 243 lb 6 oz (110.394 kg)    Vital signs reviewed     HEENT: nl dentition, turbinates, and oropharynx. Nl external ear canals without cough reflex   NECK :  without JVD/Nodes/TM/ nl carotid upstrokes bilaterally   LUNGS: no acc muscle use,  Nl contour chest with  Min bilateral End exp wheeze/ cough    CV:  RRR  no s3 or murmur or increase in P2, no edema   ABD:  soft and nontender with nl inspiratory excursion in the supine position. No bruits or organomegaly, bowel sounds nl  MS:  Nl gait/ ext warm without deformities, calf tenderness, cyanosis or clubbing No obvious joint restrictions   SKIN: warm and dry without lesions    NEURO:  alert, approp, nl sensorium with  no motor deficits       CXR PA and Lateral:   06/23/2016 :    I personally reviewed images and agree with radiology impression as follows:    No acute cardiopulmonary disease. Cardiac pacer stable position. Heart size is stable.            Assessment & Plan:

## 2016-06-23 NOTE — Patient Instructions (Addendum)
Change symbicort to 160 Take 2 puffs first thing in am and then another 2 puffs about 12 hours later.   If not better Prednisone 10 mg take  4 each am x 2 days,   2 each am x 2 days,  1 each am x 2 days and stop   Please remember to go to the  x-ray department downstairs for your tests - we will call you with the results when they are available.     Please schedule a follow up visit in 3 months but call sooner if needed

## 2016-06-24 NOTE — Assessment & Plan Note (Signed)
Try off advair 01/24/2016 and on max gerd rx > cough better, wheeze worse 02/12/2016 > restart ics = symbicort 160 2bid  - Allergy profile 03/11/2016 >  Eos 0.2 /  IgE  70 Pos RAST Mold > dust > cockroach  - Please see patient coordinator before you leave today  to schedule sinus ct - 03/11/2016  extensive coaching HFA effectiveness =    75% > try decrease symbicort 80  - Sinus CT 03/14/16 > Mild inflammatory changes s acute change     We do risk worse cough with higher doses of ics but ok to challenge with the higher dose of symbicort and continue to rx gerd with ppi/ diet

## 2016-06-24 NOTE — Assessment & Plan Note (Addendum)
Body mass index is 35.57   No results found for: TSH   Contributing to gerd tendency/ doe/reviewed the need and the process to achieve and maintain neg calorie balance > defer f/u primary care including intermittently monitoring thyroid status

## 2016-06-24 NOTE — Assessment & Plan Note (Signed)
02/12/2016    try symbicort 160 2bid instead of advair  - 03/11/2016    try symbicort 80 2bid  - 06/23/2016 flared on symbicort 80 with NO = 75 rec 160 2bid  Clearly better on the 160 than the 80 with NO >50 ppb indicating active ? Allergic asthmatic airways inflammation > ok to change to the 160 and also rx with 6 days of pred if not improving back to baseline  - The proper method of use, as well as anticipated side effects, of a metered-dose inhaler are discussed and demonstrated to the patient. Improved effectiveness after extensive coaching during this visit to a level of approximately 75 % from a baseline of 50 %    I had an extended discussion with the patient reviewing all relevant studies completed to date and  lasting 15 to 20 minutes of a 25 minute visit    Each maintenance medication was reviewed in detail including most importantly the difference between maintenance and prns and under what circumstances the prns are to be triggered using an action plan format that is not reflected in the computer generated alphabetically organized AVS.    Please see instructions for details which were reviewed in writing and the patient given a copy highlighting the part that I personally wrote and discussed at today's ov.

## 2016-06-24 NOTE — Assessment & Plan Note (Signed)
Changed to bystolic 4/82/5003 due to ? Cough variant asthma> not really clear better 02/12/2016 so rec resume the lopressor at previous dose  Concerned with higher doses of lopressor in that they may lead to spillover B2 effects so if the higher doses of symbicort don't eliminate his symptoms Strongly prefer in this setting: Bystolic, the most beta -1  selective Beta blocker available in sample form, with bisoprolol the most selective generic choice  on the market.

## 2016-06-25 ENCOUNTER — Encounter (HOSPITAL_COMMUNITY): Payer: Medicare Other | Admitting: Physical Therapy

## 2016-06-27 ENCOUNTER — Encounter (HOSPITAL_COMMUNITY): Payer: Medicare Other

## 2016-06-30 ENCOUNTER — Encounter (HOSPITAL_COMMUNITY): Payer: Self-pay | Admitting: Physical Therapy

## 2016-07-02 DIAGNOSIS — Z79899 Other long term (current) drug therapy: Secondary | ICD-10-CM | POA: Diagnosis not present

## 2016-07-02 DIAGNOSIS — E119 Type 2 diabetes mellitus without complications: Secondary | ICD-10-CM | POA: Diagnosis not present

## 2016-07-02 DIAGNOSIS — I10 Essential (primary) hypertension: Secondary | ICD-10-CM | POA: Diagnosis not present

## 2016-07-02 DIAGNOSIS — E785 Hyperlipidemia, unspecified: Secondary | ICD-10-CM | POA: Diagnosis not present

## 2016-07-03 ENCOUNTER — Encounter (HOSPITAL_COMMUNITY): Payer: Self-pay

## 2016-07-08 DIAGNOSIS — N528 Other male erectile dysfunction: Secondary | ICD-10-CM | POA: Diagnosis not present

## 2016-07-08 DIAGNOSIS — E1129 Type 2 diabetes mellitus with other diabetic kidney complication: Secondary | ICD-10-CM | POA: Diagnosis not present

## 2016-07-08 DIAGNOSIS — E785 Hyperlipidemia, unspecified: Secondary | ICD-10-CM | POA: Diagnosis not present

## 2016-07-08 DIAGNOSIS — J45998 Other asthma: Secondary | ICD-10-CM | POA: Diagnosis not present

## 2016-07-10 DIAGNOSIS — Z08 Encounter for follow-up examination after completed treatment for malignant neoplasm: Secondary | ICD-10-CM | POA: Diagnosis not present

## 2016-07-10 DIAGNOSIS — Z85828 Personal history of other malignant neoplasm of skin: Secondary | ICD-10-CM | POA: Diagnosis not present

## 2016-07-15 DIAGNOSIS — S83281D Other tear of lateral meniscus, current injury, right knee, subsequent encounter: Secondary | ICD-10-CM | POA: Diagnosis not present

## 2016-07-15 DIAGNOSIS — S83241D Other tear of medial meniscus, current injury, right knee, subsequent encounter: Secondary | ICD-10-CM | POA: Diagnosis not present

## 2016-07-30 DIAGNOSIS — M47817 Spondylosis without myelopathy or radiculopathy, lumbosacral region: Secondary | ICD-10-CM | POA: Diagnosis not present

## 2016-07-30 DIAGNOSIS — M545 Low back pain: Secondary | ICD-10-CM | POA: Diagnosis not present

## 2016-08-15 ENCOUNTER — Encounter: Payer: Self-pay | Admitting: Internal Medicine

## 2016-08-15 ENCOUNTER — Encounter: Payer: Self-pay | Admitting: *Deleted

## 2016-08-15 ENCOUNTER — Ambulatory Visit (INDEPENDENT_AMBULATORY_CARE_PROVIDER_SITE_OTHER): Payer: Medicare Other | Admitting: Internal Medicine

## 2016-08-15 ENCOUNTER — Encounter (INDEPENDENT_AMBULATORY_CARE_PROVIDER_SITE_OTHER): Payer: Self-pay

## 2016-08-15 VITALS — BP 114/72 | HR 82 | Ht 69.0 in | Wt 243.0 lb

## 2016-08-15 DIAGNOSIS — Z950 Presence of cardiac pacemaker: Secondary | ICD-10-CM

## 2016-08-15 DIAGNOSIS — I4891 Unspecified atrial fibrillation: Secondary | ICD-10-CM | POA: Diagnosis not present

## 2016-08-15 LAB — CUP PACEART INCLINIC DEVICE CHECK
Brady Statistic RV Percent Paced: 4.6 %
Implantable Lead Implant Date: 20051106
Implantable Lead Location: 753859
Lead Channel Impedance Value: 625 Ohm
Lead Channel Pacing Threshold Amplitude: 0.75 V
Lead Channel Pacing Threshold Pulse Width: 0.4 ms
Lead Channel Sensing Intrinsic Amplitude: 1.2 mV
Lead Channel Sensing Intrinsic Amplitude: 12 mV
MDC IDC LEAD IMPLANT DT: 20051106
MDC IDC LEAD LOCATION: 753860
MDC IDC MSMT BATTERY REMAINING LONGEVITY: 112.8
MDC IDC MSMT BATTERY VOLTAGE: 2.98 V
MDC IDC MSMT LEADCHNL RA IMPEDANCE VALUE: 362.5 Ohm
MDC IDC SESS DTM: 20170818125626
MDC IDC SET LEADCHNL RA PACING AMPLITUDE: 2 V
MDC IDC SET LEADCHNL RV PACING AMPLITUDE: 1 V
MDC IDC SET LEADCHNL RV PACING PULSEWIDTH: 0.4 ms
MDC IDC SET LEADCHNL RV SENSING SENSITIVITY: 2 mV
MDC IDC STAT BRADY RA PERCENT PACED: 69 %
Pulse Gen Serial Number: 7439597

## 2016-08-15 LAB — CBC
HCT: 48.8 % (ref 38.5–50.0)
Hemoglobin: 16.8 g/dL (ref 13.2–17.1)
MCH: 31.3 pg (ref 27.0–33.0)
MCHC: 34.4 g/dL (ref 32.0–36.0)
MCV: 91 fL (ref 80.0–100.0)
MPV: 9.2 fL (ref 7.5–12.5)
Platelets: 170 K/uL (ref 140–400)
RBC: 5.36 MIL/uL (ref 4.20–5.80)
RDW: 13.7 % (ref 11.0–15.0)
WBC: 6.1 K/uL (ref 3.8–10.8)

## 2016-08-15 LAB — BASIC METABOLIC PANEL WITH GFR
BUN: 18 mg/dL (ref 7–25)
CO2: 26 mmol/L (ref 20–31)
Calcium: 9.3 mg/dL (ref 8.6–10.3)
Chloride: 103 mmol/L (ref 98–110)
Creat: 1.27 mg/dL — ABNORMAL HIGH (ref 0.70–1.18)
Glucose, Bld: 171 mg/dL — ABNORMAL HIGH (ref 65–99)
Potassium: 4.3 mmol/L (ref 3.5–5.3)
Sodium: 140 mmol/L (ref 135–146)

## 2016-08-15 NOTE — Patient Instructions (Signed)
Medication Instructions:   NO CHANGE  Labwork:  Your physician recommends that you HAVE LAB WORK TODAY  Testing/Procedures:  Your physician has recommended that you have a Cardioversion (DCCV). Electrical Cardioversion uses a jolt of electricity to your heart either through paddles or wired patches attached to your chest. This is a controlled, usually prescheduled, procedure. Defibrillation is done under light anesthesia in the hospital, and you usually go home the day of the procedure. This is done to get your heart back into a normal rhythm. You are not awake for the procedure. Please see the instruction sheet given to you today.    Follow-Up:  Your physician recommends that you schedule a follow-up appointment in: [place]

## 2016-08-15 NOTE — Progress Notes (Signed)
HPI [name] returns today for followup. He is a pleasant 70 yo man with a h/o symptomatic bradycardia due to sinus node dysfunction, s/p PPM. He underwent generator change over 4 years ago. No chest pain or sob. No syncope. He admits to sodium indiscretion. He does not experience palpitations. He has experienced increasing fatigue and weakness over the past 2 weeks, especially with exertion.  Allergies  Allergen Reactions  . Food Anaphylaxis and Shortness Of Breath    Tree nuts- breathing difficulty, seafood- breathing difficulty  . Iodine Itching and Other (See Comments)    Reaction unknown  . Clavulanic Acid Diarrhea  . Shellfish Allergy     unknown  . Voltaren [Diclofenac Sodium] Other (See Comments)    Feels like things are crawling on him     Current Outpatient Prescriptions  Medication Sig Dispense Refill  . acetaminophen (TYLENOL) 500 MG tablet Take 500 mg by mouth every 6 (six) hours as needed. Pt said he takes 2 500 mg tablets  Every 6-8 hours ( averages 1 gm  Tid) and was told he could do so    . amLODipine (NORVASC) 10 MG tablet Take 10 mg by mouth daily.      . budesonide-formoterol (SYMBICORT) 160-4.5 MCG/ACT inhaler Take 2 puffs first thing in am and then another 2 puffs about 12 hours later. 1 Inhaler 11  . canagliflozin (INVOKANA) 300 MG TABS tablet Take 300 mg by mouth daily before breakfast.    . cloNIDine (CATAPRES) 0.1 MG tablet Take 0.1 mg by mouth 2 (two) times daily.    Marland Kitchen glipiZIDE (GLUCOTROL XL) 2.5 MG 24 hr tablet Take 2.5 mg by mouth daily with breakfast.    . levothyroxine (SYNTHROID, LEVOTHROID) 150 MCG tablet Take 1 tablet by mouth daily.    Marland Kitchen losartan-hydrochlorothiazide (HYZAAR) 100-25 MG per tablet Take 1 tablet by mouth daily.    . metoprolol (LOPRESSOR) 100 MG tablet Take 100 mg by mouth 2 (two) times daily.    . Multiple Vitamin (MULTIVITAMIN) tablet Take 1 tablet by mouth daily.      . pantoprazole (PROTONIX) 40 MG tablet Take 1 tablet (40 mg total)  by mouth daily. Take 30-60 min before first meal of the day 30 tablet 2  . simvastatin (ZOCOR) 20 MG tablet Take 20 mg by mouth at bedtime.    Alveda Reasons 20 MG TABS tablet TAKE 1 TABLET BY MOUTH DAILY WITH SUPPER 90 tablet 1   No current facility-administered medications for this visit.      Past Medical History:  Diagnosis Date  . Allergic rhinitis   . Aortic valve disorder   . Arthritis    ostoearthritis  . Asthma    since childhood- seasonal allergies induced  . Atrial fibrillation ([place])   . Carotid artery stenosis   . Diabetes ([place])   . Full dentures   . GERD (gastroesophageal reflux disease)   . H/O hiatal hernia   . Hemorrhage of rectum   . Hyperlipidemia   . Hypertension   . Hypothyroidism   . 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 OSA (obstructive sleep apnea)    done in [place] 2005- place no longer in business-does not use  . Pacemaker    Oct 2005 in [place].  Marland Kitchen RBBB (right bundle branch block)    Coronary Artherosclerosis  . Sinoatrial node dysfunction ([facility])   . Sinoatrial node dysfunction ([facility])   . Syncope   . Tricuspid valve disorder     ROS:   All systems  reviewed and negative except as noted in the HPI.   Past Surgical History:  Procedure Laterality Date  . A-V CARDIAC PACEMAKER INSERTION     Sick sinus syndrome DDR pacer  . Arthropathy  2005   Rebuilding of left thumb and joint   . CARDIAC CATHETERIZATION    . CARDIAC ELECTROPHYSIOLOGY STUDY AND ABLATION  09/2008   for pvcs, Dr. Loralie Champagne  . CARPAL TUNNEL RELEASE  1994   right wrist  . CARPAL TUNNEL RELEASE  05/04/2012   Procedure: CARPAL TUNNEL RELEASE;  Surgeon: Wynonia Sours, MD;  Location: [facility];  Service: Orthopedics;  Laterality: Left;  . CARPOMETACARPEL SUSPENSION PLASTY Right 11/16/2014   Procedure: SUSPENSIONPLASTY RIGHT THUMB TENDON TRANSFER ABDUCTOR POLLICUS LONGUS EXCISION TRAPEZIUM;  Surgeon: Daryll Brod, MD;  Location: [facility];  Service: Orthopedics;  Laterality: Right;   . CHOLECYSTECTOMY  1994  . CIRCUMCISION    . COLONOSCOPY N/A 03/14/2013   Procedure: COLONOSCOPY;  Surgeon: Daneil Dolin, MD;  [facility];  Service: Endoscopy;  Laterality: N/A;  8:15 AM  . 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 EYE SURGERY  2012   Left eye Corneal transplant- partial- Cataract  . GALLBLADDER SURGERY  12/01/2006  . HAND TENDON SURGERY Left late 1990's   thumb  . HEMORROIDECTOMY  2003  . left Knee Arthroscopy     [facility] 2011- [facility]  . PARTIAL KNEE ARTHROPLASTY  11/22/2012   Procedure: UNICOMPARTMENTAL KNEE;  Surgeon: Lorn Junes, MD;  Location: [facility];  Service: Orthopedics;  Laterality: Left;  left unicompartmental knee arthroplasty  . PERMANENT PACEMAKER GENERATOR CHANGE N/A 01/12/2013   Procedure: PERMANENT PACEMAKER GENERATOR CHANGE;  Surgeon: Evans Lance, MD;  Location[facility];  Service: Cardiovascular;  Laterality: N/A;  . Rotator cuff Surgery  2001   Right shoulder  . TONSILLECTOMY       Family History  Problem Relation Age of Onset  . Other Father 58    Sudden Cardiac death  . Pancreatic cancer Mother   . Colon cancer Mother   . Colon polyps Neg Hx   . Colon cancer Maternal Aunt      Social History   Social History  . Marital status: Married    Spouse name: N/A  . Number of children: N/A  . Years of education: N/A   Occupational History  . Not on file.   Social History Main Topics  . Smoking status: Former Smoker    Packs/day: 1.00    Years: 25.00    Types: Cigarettes    Start date: 02/20/1958    Quit date: 02/12/1984  . Smokeless tobacco: Never Used  . Alcohol use 1.2 oz/week    1 Glasses of wine, 1 Cans of beer per week     Comment: 1 can of beer or glasse of wine daily  . Drug use: No  . Sexual activity: Not on file   Other Topics Concern  . Not on file   Social History Narrative   Regular exercise: No     BP 114/72   Pulse 82   Ht '5\' 9"'$  (1.753 m)   Wt 243 lb  (110.2 kg)   BMI 35.88 kg/m   Physical Exam:  Well appearing 70 yo man, NAD HEENT: Unremarkable Neck:  7 cm JVD, no thyromegally Lungs:  Clear with no wheezes, well healed PPM incision. HEART:  Regular rate rhythm, no murmurs, no  rubs, no clicks Abd:  soft, positive bowel sounds, no organomegally, no rebound, no guarding Ext:  2 plus pulses, no edema, no cyanosis, no clubbing Skin:  No rashes no nodules Neuro:  CN II through XII intact, motor grossly intact  DEVICE  Normal device function.  See PaceArt for details.   Assess/Plan: 1. PAF - he has reverted to atrial fib. Will scheduel DCCV after he has been therapeutically anti-coagulated for 3 weeks. 2. HTN - he will continue his current medical regimen. His pressures have been good. 3. Sinus node dysfunction - he is s/p PPM and is asymptomatic.  4. HTN - his blood pressure is well controlled. Continue current meds.  Mikle Bosworth.D.

## 2016-08-22 ENCOUNTER — Telehealth: Payer: Self-pay | Admitting: Internal Medicine

## 2016-08-22 NOTE — Telephone Encounter (Signed)
Follow up     Patient calling back to speak with nurse or triage regarding his previous message.

## 2016-08-22 NOTE — Telephone Encounter (Signed)
[name] was calling stating he thought he was back in rhythm and wanted to know if could cancel app for Monday for Cardioversion. He states he feels much better.  No SOB.  HR is regular.  Raquel Sarna had him send transmission which should NSR.  Spoke w/Dr. Lovena Le who advised to cancel Cardioversion.  Called hospital and spoke w/Kend[place] who cancelled procedure. Advised pt to keep his appointment in November with Dr.Taylor. He verbalized understanding.

## 2016-08-22 NOTE — Telephone Encounter (Signed)
Called patient to request that he send a manual transmission from his home monitor for review to see if he has converted to NSR.  Patient is agreeable and verbalizes understanding of transmission steps.  He plans to send transmission when he returns home in 20-25 min.  He denies additional questions or concerns at this time.

## 2016-08-22 NOTE — Telephone Encounter (Signed)
New Message:   Please call,pt thinks he is back in rhythm,probably needs to come in today to see if he is. He says if he is,he will not need the: procedure on Monday.

## 2016-08-25 ENCOUNTER — Ambulatory Visit (HOSPITAL_COMMUNITY): Admission: RE | Admit: 2016-08-25 | Payer: Medicare Other | Source: Ambulatory Visit | Admitting: Internal Medicine

## 2016-08-25 ENCOUNTER — Encounter (HOSPITAL_COMMUNITY): Admission: RE | Payer: Self-pay | Source: Ambulatory Visit

## 2016-08-25 SURGERY — CARDIOVERSION
Anesthesia: Monitor Anesthesia Care

## 2016-08-26 DIAGNOSIS — M47817 Spondylosis without myelopathy or radiculopathy, lumbosacral region: Secondary | ICD-10-CM | POA: Diagnosis not present

## 2016-08-28 ENCOUNTER — Encounter (HOSPITAL_COMMUNITY): Payer: Self-pay | Admitting: Physical Therapy

## 2016-08-28 NOTE — Therapy (Signed)
[facility] [place], [place], [address] Phone: [phone]   Fax:  [phone]  Patient Details  Name: Clarence Dawson MRN: [id number] Date of Birth: January 06, 1946 Referring Provider:  No ref. provider found  Encounter Date: 08/28/2016   PHYSICAL THERAPY DISCHARGE SUMMARY  Visits from Start of Care: 6  Current functional level related to goals / functional outcomes: Patient has not returned since last skilled session    Remaining deficits: Unable to assess    Education / Equipment: N/A  Plan: Patient agrees to discharge.  Patient goals were not met. Patient is being discharged due to not returning since the last visit.  ?????     Deniece Ree PT, DPT [facility] [address] [place], [place], [address] Phone: [phone]   Fax:  [phone]

## 2016-09-08 ENCOUNTER — Ambulatory Visit (HOSPITAL_COMMUNITY)
Admission: RE | Admit: 2016-09-08 | Discharge: 2016-09-08 | Disposition: A | Discharge status: Home / Self Care | Payer: Medicare Other | Source: Ambulatory Visit | Attending: Internal Medicine | Admitting: Internal Medicine

## 2016-09-08 ENCOUNTER — Other Ambulatory Visit (HOSPITAL_COMMUNITY): Payer: Self-pay | Admitting: Internal Medicine

## 2016-09-08 DIAGNOSIS — M25531 Pain in right wrist: Secondary | ICD-10-CM | POA: Insufficient documentation

## 2016-09-08 DIAGNOSIS — R52 Pain, unspecified: Secondary | ICD-10-CM

## 2016-09-08 DIAGNOSIS — M7989 Other specified soft tissue disorders: Secondary | ICD-10-CM | POA: Diagnosis not present

## 2016-09-08 DIAGNOSIS — R937 Abnormal findings on diagnostic imaging of other parts of musculoskeletal system: Secondary | ICD-10-CM | POA: Diagnosis not present

## 2016-09-09 DIAGNOSIS — S63501A Unspecified sprain of right wrist, initial encounter: Secondary | ICD-10-CM | POA: Diagnosis not present

## 2016-09-23 ENCOUNTER — Ambulatory Visit (INDEPENDENT_AMBULATORY_CARE_PROVIDER_SITE_OTHER): Payer: Medicare Other | Admitting: Internal Medicine

## 2016-09-23 ENCOUNTER — Encounter: Payer: Self-pay | Admitting: Internal Medicine

## 2016-09-23 VITALS — BP 132/80 | HR 66 | Ht 69.5 in | Wt 241.4 lb

## 2016-09-23 DIAGNOSIS — J45991 Cough variant asthma: Secondary | ICD-10-CM

## 2016-09-23 DIAGNOSIS — R05 Cough: Secondary | ICD-10-CM | POA: Diagnosis not present

## 2016-09-23 DIAGNOSIS — R058 Other specified cough: Secondary | ICD-10-CM

## 2016-09-23 LAB — NITRIC OXIDE: Nitric Oxide: 32

## 2016-09-23 MED ORDER — AZITHROMYCIN 250 MG PO TABS: ORAL_TABLET | ORAL | 0 refills | Status: DC

## 2016-09-23 NOTE — Assessment & Plan Note (Signed)
02/12/2016    try symbicort 160 2bid instead of advair  - 03/11/2016   try symbicort 80 2bid  - 06/23/2016 flared on symbicort 80 with FENO  = 75 rec 160 2bid - 09/23/2016  After extensive coaching HFA effectiveness =    90%  - FENO 09/23/2016  =  32   On symbicort 160 2bid > no change rx    Mild flare with prob uri, not unaddressed eos inflammation so rec zpak only  And f/u if not back to 100%   I had an extended discussion with the patient reviewing all relevant studies completed to date and  lasting 15 to 20 minutes of a 25 minute visit    Each maintenance medication was reviewed in detail including most importantly the difference between maintenance and prns and under what circumstances the prns are to be triggered using an action plan format that is not reflected in the computer generated alphabetically organized AVS.    Please see instructions for details which were reviewed in writing and the patient given a copy highlighting the part that I personally wrote and discussed at today's ov.

## 2016-09-23 NOTE — Progress Notes (Signed)
Subjective:    Patient ID: Clarence Dawson, male    DOB: 09-04-1946,     MRN: [id number]    Brief patient profile:  49 yowm quit smoking 1985 onset of breathing problems mid 2000s rx advair and helped a lot until around spring 2016 with cough varaiably productive not responding to abx x 2 / antiviral and referred to pulmonary clinic 01/24/2016 by Dr Willey Blade for refractory cough.   Brief patient profile:  01/24/2016 1st [facility] office visit/ Ruthanna Macchia  maint rx = advair 250 Chief Complaint  Patient presents with  . PULMONARY CONSULT    Referred by Dr. [name]. Pt c/o cough that is sometimes productive with white-brown thick mucus and some wheeze. Pt denies SOB/CP/tightness. Pt does have history of asthma since childhood. Pt is not on maintenance inhalers.   cough is worse before supper until 10 pm goes to sleep and does fine and then recurs p stirs each am  Not limited by breathing from desired activities   rec Stop lopressor and replace with bystolic 10 mg twice daily  Stop advair > Prednisone 10 mg take  4 each am x 2 days,   2 each am x 2 days,  1 each am x 2 days and stop  Pantoprazole (protonix) 40 mg   Take  30-60 min before first meal of the day and Pepcid (famotidine)  20 mg one @  bedtime until return to office - this is the best way to tell whether stomach acid is contributing to your problem.   GERD diet   02/12/2016  f/u ov/Dontavis Tschantz re:  Cough variant asthma vs uacs  Chief Complaint  Patient presents with  . Cough    Cough is improved since last OV.   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  Work on inhaler technique:   Ok to change back to [employer] to change back to the ranitidine when you finish up the pantoprazole     03/11/2016  f/u ov/Arnice Vanepps re:  Chronic asthma/  symbicort 160 2bid   Chief Complaint  Patient presents with  . Follow-up    Cough had resolved until a  few days ago. He has minimal cough with green sputum.    cough worse in am/ ? Caught cold but notes top dentures don't fit whenever "has a cold" and now green mucus each am  Thinks symbicort much better than advair  rec Plan A = Automatic = Symbicort 80 Take 2 puffs first thing in am and then another 2 puffs about 12 hours later.  Plan B = Backup Only use your albuterol (proventil) as a rescue medication Please see patient coordinator before you leave today  to schedule sinus CT > neg Augmentin 875 mg take one pill twice daily  X 10 days> only took a few then stopped due to diarrhea  Cruise May 19 2016 > cough persisted  Knee surgery May 24/2017      06/23/2016  f/u ov/Chere Babson re:  Recurrent cough x one year Chief Complaint  Patient presents with  . Follow-up    Pt states that his cough is sometimes better and sometimes worse. Cough is prod with thick, light brown sputum.   cough is highly variable symbiocrt 80 2 every 12 hours / prn proventil  Tends to be worse p stirring in am for first hours  and one hour before hs  Always better when has been rx'd with prednisone / ? Better on 160 vs 80  rec Change symbicort to 160 Take 2 puffs first thing in am and then another 2 puffs about 12 hours later.  If not better Prednisone 10 mg take  4 each am x 2 days,   2 each am x 2 days,  1 each am x 2 days and stop  Please remember to go to the  x-ray department downstairs for your tests - we will call you with the results when they are available.    09/23/2016  f/u ov/Jayvian Escoe re: cough variant asthma on symb 160 2bid / no need for saba  Chief Complaint  Patient presents with  . Follow-up    Pt c/o increased cough for the past wk, started while [place] was [place] the beach. He is coughing up brown sputum.         No obvious day to day or daytime variability or assoc sob   or cp or chest tightness, subjective wheeze or overt   hb symptoms. No unusual exp hx or h/o childhood pna/ asthma or knowledge of premature  birth.  Sleeping ok without nocturnal  or early am exacerbation  of respiratory  c/o's or need for noct saba. Also denies any obvious fluctuation of symptoms with weather or environmental changes or other aggravating or alleviating factors except as outlined above   Current Medications, Allergies, Complete Past Medical History, Past Surgical History, Family History, and Social History were reviewed in [employer] record.  ROS  The following are not active complaints unless bolded sore throat, dysphagia, dental problems, itching, sneezing,  nasal congestion or excess/ purulent secretions, ear ache,   fever, chills, sweats, unintended wt loss, classically pleuritic or exertional cp, hemoptysis,  orthopnea pnd or leg swelling, presyncope, palpitations, abdominal pain, anorexia, nausea, vomiting, diarrhea  or change in bowel or bladder habits, change in stools or urine, dysuria,hematuria,  rash, arthralgias, visual complaints, headache, numbness, weakness or ataxia or problems with walking or coordination,  change in mood/affect or memory.                Objective:   Physical Exam  Obese   amb wm nad min hoarse   02/12/2016        241 > 03/11/2016 243 > 06/23/2016  241 > 09/23/2016  241     01/24/16 237 lb (107.502 kg)  02/16/15 240 lb (108.863 kg)  11/16/14 243 lb 6 oz (110.394 kg)    Vital signs reviewed     HEENT: nl dentition, turbinates, and oropharynx. Nl external ear canals without cough reflex   NECK :  without JVD/Nodes/TM/ nl carotid upstrokes bilaterally   LUNGS: no acc muscle use,  Nl contour chest with  Min cough on FVC/ lungs clear bilaterally    CV:  RRR  no s3 or murmur or increase in P2, no edema   ABD:  soft and nontender with nl inspiratory excursion in the supine position. No bruits or organomegaly, bowel sounds nl  MS:  Nl gait/ ext warm without deformities, calf tenderness, cyanosis or clubbing No obvious joint restrictions   SKIN:  warm and dry without lesions    NEURO:  alert, approp, nl sensorium with  no motor deficits       CXR PA and Lateral:   06/23/2016 :    I personally reviewed images and agree with radiology impression as follows:  No acute cardiopulmonary disease. Cardiac pacer stable position. Heart size is stable.            Assessment & Plan:

## 2016-09-23 NOTE — Patient Instructions (Addendum)
zpak should turn the mucus back to white - if not call me   No change on the symbicort dose = Take 2 puffs first thing in am and then another 2 puffs about 12 hours later.    For cough > mucinex dm up to 1200 mg every 12 hours  Please schedule a follow up visit in 3 months but call sooner if needed

## 2016-09-23 NOTE — Assessment & Plan Note (Signed)
-   Allergy profile 03/11/2016 >  Eos 0.2 /  IgE  70 Pos RAST Mold > dust > cockroach   - Sinus CT 03/14/16 > Mild inflammatory changes s acute change  Hard to be sure re flare as to whether may have component of uacs flare but for now no change in maint rx which includes ppi qam/ reviewed

## 2016-10-20 ENCOUNTER — Emergency Department (HOSPITAL_COMMUNITY): Payer: Medicare Other

## 2016-10-20 ENCOUNTER — Observation Stay (HOSPITAL_COMMUNITY)
Admission: EM | Admit: 2016-10-20 | Discharge: 2016-10-21 | Disposition: A | Discharge status: Home / Self Care | Payer: Medicare Other | Attending: Internal Medicine | Admitting: Internal Medicine

## 2016-10-20 ENCOUNTER — Encounter (HOSPITAL_COMMUNITY): Payer: Self-pay | Admitting: Emergency Medicine

## 2016-10-20 DIAGNOSIS — E039 Hypothyroidism, unspecified: Secondary | ICD-10-CM | POA: Diagnosis not present

## 2016-10-20 DIAGNOSIS — Z79899 Other long term (current) drug therapy: Secondary | ICD-10-CM | POA: Insufficient documentation

## 2016-10-20 DIAGNOSIS — E119 Type 2 diabetes mellitus without complications: Secondary | ICD-10-CM

## 2016-10-20 DIAGNOSIS — J45909 Unspecified asthma, uncomplicated: Secondary | ICD-10-CM | POA: Diagnosis not present

## 2016-10-20 DIAGNOSIS — Z7984 Long term (current) use of oral hypoglycemic drugs: Secondary | ICD-10-CM | POA: Diagnosis not present

## 2016-10-20 DIAGNOSIS — Z950 Presence of cardiac pacemaker: Secondary | ICD-10-CM | POA: Diagnosis present

## 2016-10-20 DIAGNOSIS — I482 Chronic atrial fibrillation: Secondary | ICD-10-CM | POA: Diagnosis not present

## 2016-10-20 DIAGNOSIS — R0789 Other chest pain: Secondary | ICD-10-CM | POA: Diagnosis not present

## 2016-10-20 DIAGNOSIS — R079 Chest pain, unspecified: Secondary | ICD-10-CM | POA: Diagnosis not present

## 2016-10-20 DIAGNOSIS — Z87891 Personal history of nicotine dependence: Secondary | ICD-10-CM | POA: Insufficient documentation

## 2016-10-20 DIAGNOSIS — I495 Sick sinus syndrome: Secondary | ICD-10-CM | POA: Diagnosis present

## 2016-10-20 DIAGNOSIS — E782 Mixed hyperlipidemia: Secondary | ICD-10-CM | POA: Diagnosis present

## 2016-10-20 DIAGNOSIS — I10 Essential (primary) hypertension: Secondary | ICD-10-CM | POA: Diagnosis not present

## 2016-10-20 DIAGNOSIS — E785 Hyperlipidemia, unspecified: Secondary | ICD-10-CM | POA: Diagnosis present

## 2016-10-20 DIAGNOSIS — I4819 Other persistent atrial fibrillation: Secondary | ICD-10-CM | POA: Diagnosis present

## 2016-10-20 DIAGNOSIS — J4550 Severe persistent asthma, uncomplicated: Secondary | ICD-10-CM | POA: Diagnosis present

## 2016-10-20 HISTORY — DX: Unspecified osteoarthritis, unspecified site: M19.90

## 2016-10-20 HISTORY — DX: Type 2 diabetes mellitus without complications: E11.9

## 2016-10-20 HISTORY — DX: Essential (primary) hypertension: I10

## 2016-10-20 HISTORY — DX: Paroxysmal atrial fibrillation: I48.0

## 2016-10-20 LAB — LIPASE, BLOOD: LIPASE: 26 U/L (ref 11–51)

## 2016-10-20 LAB — COMPREHENSIVE METABOLIC PANEL
ALBUMIN: 4.1 g/dL (ref 3.5–5.0)
ALT: 21 U/L (ref 17–63)
AST: 18 U/L (ref 15–41)
Alkaline Phosphatase: 60 U/L (ref 38–126)
Anion gap: 7 (ref 5–15)
BUN: 20 mg/dL (ref 6–20)
CHLORIDE: 103 mmol/L (ref 101–111)
CO2: 30 mmol/L (ref 22–32)
Calcium: 9.7 mg/dL (ref 8.9–10.3)
Creatinine, Ser: 1 mg/dL (ref 0.61–1.24)
GFR calc Af Amer: >60 mL/min (ref >60)
GFR calc non Af Amer: >60 mL/min (ref >60)
GLUCOSE: 144 mg/dL — AB (ref 65–99)
POTASSIUM: 3.9 mmol/L (ref 3.5–5.1)
SODIUM: 140 mmol/L (ref 135–145)
Total Bilirubin: 1 mg/dL (ref 0.3–1.2)
Total Protein: 7.5 g/dL (ref 6.5–8.1)

## 2016-10-20 LAB — CBC WITH DIFFERENTIAL/PLATELET
BASOS ABS: 0 10*3/uL (ref 0.0–0.1)
Basophils Relative: 0 %
EOS ABS: 0.1 10*3/uL (ref 0.0–0.7)
EOS PCT: 3 %
HCT: 53 % — ABNORMAL HIGH (ref 39.0–52.0)
Hemoglobin: 18.6 g/dL — ABNORMAL HIGH (ref 13.0–17.0)
Lymphocytes Relative: 32 %
Lymphs Abs: 1.5 10*3/uL (ref 0.7–4.0)
MCH: 31.6 pg (ref 26.0–34.0)
MCHC: 35.1 g/dL (ref 30.0–36.0)
MCV: 90 fL (ref 78.0–100.0)
Monocytes Absolute: 0.5 10*3/uL (ref 0.1–1.0)
Monocytes Relative: 10 %
NEUTROS PCT: 55 %
Neutro Abs: 2.7 10*3/uL (ref 1.7–7.7)
PLATELETS: 141 10*3/uL — AB (ref 150–400)
RBC: 5.89 MIL/uL — AB (ref 4.22–5.81)
RDW: 12.7 % (ref 11.5–15.5)
WBC: 4.8 10*3/uL (ref 4.0–10.5)

## 2016-10-20 LAB — GLUCOSE, CAPILLARY: GLUCOSE-CAPILLARY: 154 mg/dL — AB (ref 65–99)

## 2016-10-20 LAB — TROPONIN I: Troponin I: <0.03 ng/mL (ref <0.03)

## 2016-10-20 MED ORDER — GI COCKTAIL ~~LOC~~: 30.0000 mL | Freq: Four times a day (QID) | ORAL | Status: DC | PRN

## 2016-10-20 MED ORDER — INSULIN ASPART 100 UNIT/ML ~~LOC~~ SOLN
0.0000 [IU] | Freq: Three times a day (TID) | SUBCUTANEOUS | Status: DC
  Administered 2016-10-21: 2 [IU] via SUBCUTANEOUS
  Administered 2016-10-21: 3 [IU] via SUBCUTANEOUS

## 2016-10-20 MED ORDER — HYDROCHLOROTHIAZIDE 25 MG PO TABS
25.0000 mg | ORAL_TABLET | Freq: Every day | ORAL | Status: DC
  Administered 2016-10-20 – 2016-10-21 (×2): 25 mg via ORAL
  Filled 2016-10-20 (×3): qty 1

## 2016-10-20 MED ORDER — ONDANSETRON HCL 4 MG/2ML IJ SOLN: 4.0000 mg | Freq: Four times a day (QID) | INTRAMUSCULAR | Status: DC | PRN

## 2016-10-20 MED ORDER — ACETAMINOPHEN 325 MG PO TABS
650.0000 mg | ORAL_TABLET | ORAL | Status: DC | PRN
  Administered 2016-10-20: 650 mg via ORAL
  Filled 2016-10-20: qty 2

## 2016-10-20 MED ORDER — INSULIN ASPART 100 UNIT/ML ~~LOC~~ SOLN: 0.0000 [IU] | Freq: Every day | SUBCUTANEOUS | Status: DC

## 2016-10-20 MED ORDER — LOSARTAN POTASSIUM-HCTZ 100-25 MG PO TABS: 1.0000 | ORAL_TABLET | Freq: Every day | ORAL | Status: DC

## 2016-10-20 MED ORDER — ADULT MULTIVITAMIN W/MINERALS CH
1.0000 | ORAL_TABLET | Freq: Every day | ORAL | Status: DC
  Administered 2016-10-20 – 2016-10-21 (×2): 1 via ORAL
  Filled 2016-10-20 (×2): qty 1

## 2016-10-20 MED ORDER — GLIPIZIDE ER 2.5 MG PO TB24
2.5000 mg | ORAL_TABLET | Freq: Every day | ORAL | Status: DC
  Administered 2016-10-21: 2.5 mg via ORAL
  Filled 2016-10-20 (×2): qty 1

## 2016-10-20 MED ORDER — GI COCKTAIL ~~LOC~~
30.0000 mL | Freq: Once | ORAL | Status: AC
  Administered 2016-10-20: 30 mL via ORAL
  Filled 2016-10-20: qty 30

## 2016-10-20 MED ORDER — GI COCKTAIL ~~LOC~~: 30.0000 mL | Freq: Once | ORAL | Status: DC

## 2016-10-20 MED ORDER — MOMETASONE FURO-FORMOTEROL FUM 200-5 MCG/ACT IN AERO
2.0000 | INHALATION_SPRAY | Freq: Two times a day (BID) | RESPIRATORY_TRACT | Status: DC
  Administered 2016-10-20 – 2016-10-21 (×2): 2 via RESPIRATORY_TRACT
  Filled 2016-10-20: qty 8.8

## 2016-10-20 MED ORDER — METOPROLOL TARTRATE 50 MG PO TABS
100.0000 mg | ORAL_TABLET | Freq: Two times a day (BID) | ORAL | Status: DC
  Administered 2016-10-20 – 2016-10-21 (×2): 100 mg via ORAL
  Filled 2016-10-20 (×2): qty 2

## 2016-10-20 MED ORDER — LOSARTAN POTASSIUM 50 MG PO TABS
100.0000 mg | ORAL_TABLET | Freq: Every day | ORAL | Status: DC
  Administered 2016-10-20 – 2016-10-21 (×2): 100 mg via ORAL
  Filled 2016-10-20 (×2): qty 2

## 2016-10-20 MED ORDER — AMLODIPINE BESYLATE 5 MG PO TABS
10.0000 mg | ORAL_TABLET | Freq: Every day | ORAL | Status: DC
  Administered 2016-10-20 – 2016-10-21 (×2): 10 mg via ORAL
  Filled 2016-10-20 (×2): qty 2

## 2016-10-20 MED ORDER — RIVAROXABAN 20 MG PO TABS
20.0000 mg | ORAL_TABLET | Freq: Every day | ORAL | Status: DC
  Administered 2016-10-20: 20 mg via ORAL
  Filled 2016-10-20: qty 1

## 2016-10-20 MED ORDER — CANAGLIFLOZIN 100 MG PO TABS
300.0000 mg | ORAL_TABLET | Freq: Every day | ORAL | Status: DC
  Administered 2016-10-21: 300 mg via ORAL
  Filled 2016-10-20 (×2): qty 3

## 2016-10-20 MED ORDER — CLONIDINE HCL 0.1 MG PO TABS
0.1000 mg | ORAL_TABLET | Freq: Two times a day (BID) | ORAL | Status: DC
  Administered 2016-10-20 – 2016-10-21 (×2): 0.1 mg via ORAL
  Filled 2016-10-20 (×2): qty 1

## 2016-10-20 MED ORDER — LEVOTHYROXINE SODIUM 75 MCG PO TABS
150.0000 ug | ORAL_TABLET | Freq: Every day | ORAL | Status: DC
  Administered 2016-10-21: 150 ug via ORAL
  Filled 2016-10-20: qty 2

## 2016-10-20 MED ORDER — SIMVASTATIN 20 MG PO TABS
20.0000 mg | ORAL_TABLET | Freq: Every day | ORAL | Status: DC
  Administered 2016-10-20: 20 mg via ORAL
  Filled 2016-10-20: qty 1

## 2016-10-20 MED ORDER — INSULIN ASPART 100 UNIT/ML ~~LOC~~ SOLN
4.0000 [IU] | Freq: Three times a day (TID) | SUBCUTANEOUS | Status: DC
  Administered 2016-10-21 (×2): 4 [IU] via SUBCUTANEOUS

## 2016-10-20 NOTE — ED Notes (Signed)
Patient walked to the bathroom with minimal assistance.  

## 2016-10-20 NOTE — ED Triage Notes (Signed)
PT c/o central chest pain radiating to his back that started this am around 0430.

## 2016-10-20 NOTE — ED Provider Notes (Signed)
[facility] DEPT Provider Note   CSN: [id number] Arrival date & time: 10/20/16  0754     History   Chief Complaint Chief Complaint  Patient presents with  . Chest Pain    HPI Clarence Dawson is a 70 y.o. male.  HPI PT c/o central chest pain radiating to his back that started this am around 0430.  Past Medical History:  Diagnosis Date  . Allergic rhinitis   . Aortic valve disorder   . Asthma    since childhood- seasonal allergies induced  . Carotid artery stenosis   . Essential hypertension   . Full dentures   . GERD (gastroesophageal reflux disease)   . H/O hiatal hernia   . Hemorrhage of rectum   . Hyperlipidemia   . Hypothyroidism   . 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 OSA (obstructive sleep apnea)   . Osteoarthritis   . Pacemaker    Oct 2005 in [place].  Marland Kitchen PAF (paroxysmal atrial fibrillation) ([place])   . RBBB (right bundle branch block)   . Sinoatrial node dysfunction (HCC)   . Syncope   . Tricuspid valve disorder   . Type 2 diabetes mellitus [facility])     Patient Active Problem List   Diagnosis Date Noted  . Chest pain 10/20/2016  . HTN (hypertension) 10/20/2016  . Morbid obesity due to excess calories ([place]) 06/24/2016  . Cough variant asthma 02/12/2016  . Upper airway cough syndrome 01/24/2016  . 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 Essential hypertension   . Atrial fibrillation ([place])   . Syncope   . Hyperthyroidism   . Hyperlipidemia   . RBBB (right bundle branch block)   . Tricuspid valve disorder   . Aortic valve disorder   . Carotid artery stenosis   . Diabetes ([place])   . Pacemaker   . OSA (obstructive sleep apnea)   . Arthritis   . Sinoatrial node dysfunction (HCC)   . PVC's (premature ventricular contractions) 07/17/2011  . Essential hypertension, benign 01/24/2011  . PREMATURE VENTRICULAR CONTRACTIONS 01/24/2011  . PPM-[place] 01/24/2011    Past Surgical History:  Procedure Laterality Date  . A-V CARDIAC PACEMAKER INSERTION     Sick sinus syndrome DDR  pacer  . Arthropathy  2005   Rebuilding of left thumb and joint   . CARDIAC CATHETERIZATION    . CARDIAC ELECTROPHYSIOLOGY STUDY AND ABLATION  09/2008   for pvcs, Dr. Loralie Champagne  . CARPAL TUNNEL RELEASE  1994   right wrist  . CARPAL TUNNEL RELEASE  05/04/2012   Procedure: CARPAL TUNNEL RELEASE;  Surgeon: Wynonia Sours, MD;  Location: [facility];  Service: Orthopedics;  Laterality: Left;  . CARPOMETACARPEL SUSPENSION PLASTY Right 11/16/2014   Procedure: SUSPENSIONPLASTY RIGHT THUMB TENDON TRANSFER ABDUCTOR POLLICUS LONGUS EXCISION TRAPEZIUM;  Surgeon: Daryll Brod, MD;  Location: [facility];  Service: Orthopedics;  Laterality: Right;  . CHOLECYSTECTOMY  1994  . CIRCUMCISION    . COLONOSCOPY N/A 03/14/2013   Procedure: COLONOSCOPY;  Surgeon: Daneil Dolin, MD;  [facility];  Service: Endoscopy;  Laterality: N/A;  8:15 AM  . EYE SURGERY     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-[facility]  . EYE SURGERY  2012   Left eye Corneal transplant- partial- Cataract  . GALLBLADDER SURGERY  12/01/2006  . HAND TENDON SURGERY Left late 1990's   thumb  . HEMORROIDECTOMY  2003  . left Knee Arthroscopy     [facility]- [facility]  . PARTIAL KNEE ARTHROPLASTY  11/22/2012   Procedure: UNICOMPARTMENTAL KNEE;  Surgeon: Lorn Junes, MD;  Location: [facility];  Service: Orthopedics;  Laterality: Left;  left unicompartmental knee arthroplasty  . PERMANENT PACEMAKER GENERATOR CHANGE N/A 01/12/2013   Procedure: PERMANENT PACEMAKER GENERATOR CHANGE;  Surgeon: Evans Lance, MD;  Location[facility] C[facility] LAB;  Service: Cardiovascular;  Laterality: N/A;  . Rotator cuff Surgery  2001   Right shoulder  . TONSILLECTOMY         Home Medications    Prior to Admission medications   Medication Sig Start Date End Date Taking? Authorizing Provider  acetaminophen (TYLENOL) 500 MG tablet Take 500 mg by mouth every 6 (six) hours as needed. Pt said he takes 2 500 mg tablets  Every 6-8  hours ( averages 1 gm  Tid) and was told he could do so   Yes Historical Provider, MD  amLODipine (NORVASC) 10 MG tablet Take 10 mg by mouth daily.     Yes Historical Provider, MD  budesonide-formoterol (SYMBICORT) 160-4.5 MCG/ACT inhaler Take 2 puffs first thing in am and then another 2 puffs about 12 hours later. 06/23/16  Yes Tanda Rockers, MD  canagliflozin (INVOKANA) 300 MG TABS tablet Take 300 mg by mouth daily before breakfast.   Yes Historical Provider, MD  cloNIDine (CATAPRES) 0.1 MG tablet Take 0.1 mg by mouth 2 (two) times daily.   Yes Historical Provider, MD  glipiZIDE (GLUCOTROL XL) 2.5 MG 24 hr tablet Take 2.5 mg by mouth daily with breakfast.   Yes Historical Provider, MD  levothyroxine (SYNTHROID, LEVOTHROID) 150 MCG tablet Take 1 tablet by mouth daily. 06/04/16  Yes Historical Provider, MD  losartan-hydrochlorothiazide (HYZAAR) 100-25 MG per tablet Take 1 tablet by mouth daily.   Yes Historical Provider, MD  metoprolol (LOPRESSOR) 100 MG tablet Take 100 mg by mouth 2 (two) times daily.   Yes Historical Provider, MD  Multiple Vitamin (MULTIVITAMIN) tablet Take 1 tablet by mouth daily.     Yes Historical Provider, MD  simvastatin (ZOCOR) 20 MG tablet Take 20 mg by mouth at bedtime.   Yes Historical Provider, MD  XARELTO 20 MG TABS tablet TAKE 1 TABLET BY MOUTH DAILY WITH SUPPER 11/06/15  Yes Evans Lance, MD    Family History Family History  Problem Relation Age of Onset  . Other Father 34    Sudden Cardiac death  . Pancreatic cancer Mother   . Colon cancer Mother   . Colon cancer Maternal Aunt   . Colon polyps Neg Hx     Social History Social History  Substance Use Topics  . Smoking status: Former Smoker    Packs/day: 1.00    Years: 25.00    Types: Cigarettes    Start date: 02/20/1958    Quit date: 02/12/1984  . Smokeless tobacco: Never Used  . Alcohol use 1.2 oz/week    1 Glasses of wine, 1 Cans of beer per week     Comment: 1 can of beer or glasse of wine daily      Allergies   Food; Iodinated diagnostic agents; Iodine; Clavulanic acid; Shellfish allergy; and Voltaren [diclofenac sodium]   Review of Systems Review of Systems  Cardiovascular: Positive for chest pain.  All other systems reviewed and are negative.    Physical Exam Updated Vital Signs BP (!) 122/58 (BP Location: Left Arm)   Pulse 61   Temp 98.4 F (36.9 C) (Oral)   Resp 16   Ht '5\' 9"'$  (1.753 m)   Wt 232  lb (105.2 kg)   SpO2 94%   BMI 34.26 kg/m   Physical Exam  Constitutional: He is oriented to person, place, and time. He appears well-developed and well-nourished. No distress.  HENT:  Head: Normocephalic and atraumatic.  Eyes: Pupils are equal, round, and reactive to light.  Neck: Normal range of motion.  Cardiovascular: Normal rate and intact distal pulses.   No murmur heard. Pulmonary/Chest: No respiratory distress.  Abdominal: Soft. Normal appearance and bowel sounds are normal. There is no tenderness. There is no guarding.  Musculoskeletal: Normal range of motion.  Neurological: He is alert and oriented to person, place, and time. No cranial nerve deficit.  Skin: Skin is warm and dry. No rash noted.  Psychiatric: He has a normal mood and affect. His behavior is normal.  Nursing note and vitals reviewed.    ED Treatments / Results  Labs (all labs ordered are listed, but only abnormal results are displayed) Labs Reviewed  CBC WITH DIFFERENTIAL/PLATELET - Abnormal; Notable for the following:       Result Value   RBC 5.89 (*)    Hemoglobin 18.6 (*)    HCT 53.0 (*)    Platelets 141 (*)    All other components within normal limits  COMPREHENSIVE METABOLIC PANEL - Abnormal; Notable for the following:    Glucose, Bld 144 (*)    All other components within normal limits  HEMOGLOBIN A1C - Abnormal; Notable for the following:    Hgb A1c MFr Bld 8.1 (*)    All other components within normal limits  GLUCOSE, CAPILLARY - Abnormal; Notable for the following:     Glucose-Capillary 154 (*)    All other components within normal limits  GLUCOSE, CAPILLARY - Abnormal; Notable for the following:    Glucose-Capillary 143 (*)    All other components within normal limits  GLUCOSE, CAPILLARY - Abnormal; Notable for the following:    Glucose-Capillary 158 (*)    All other components within normal limits  TROPONIN I  LIPASE, BLOOD  TROPONIN I  TROPONIN I  TROPONIN I    EKG  EKG Interpretation  Date/Time:  Monday October 20 2016 08:02:05 EDT Ventricular Rate:  60 PR Interval:    QRS Duration: 159 QT Interval:  453 QTC Calculation: 453 R Axis:   101 Text Interpretation:  Sinus rhythm Right bundle branch block No significant change since last tracing Confirmed by Nasia Cannan  MD, Trestin Vences ([id number]) on 10/20/2016 8:14:55 AM       Radiology No results found.  Procedures Procedures (including critical care time)  Medications Ordered in ED Medications  gi cocktail (Maalox,Lidocaine,Donnatal) (30 mLs Oral Given 10/20/16 0933)     Initial Impression / Assessment and Plan / ED Course  I have reviewed the triage vital signs and the nursing notes.  Pertinent labs & imaging results that were available during my care of the patient were reviewed by me and considered in my medical decision making (see chart for details).  Clinical Course   Patient was admitted with diagnosis of chest pain.    Final Clinical Impressions(s) / ED Diagnoses   Final diagnoses:  Chest pain, unspecified type    New Prescriptions Discharge Medication List as of 10/21/2016  2:21 PM       Leonard Schwartz, MD 11/09/16 1043

## 2016-10-20 NOTE — H&P (Signed)
History and Physical    Clarence Dawson [id number] DOB: 12-19-1946 DOA: 10/20/2016  PCP: [name], MD   Patient coming from: Home  Chief Complaint: Chest pain  HPI: Clarence Dawson is a 70 y.o. male with medical history significant of HTN, HLD, atrial-fibrillation, hyperthyroidism, DM, ulcer, and a hiatal hernia who complains of central chest pain that began around 4:30AM this morning. Patient states that the pain radiates to his right shoulder blade. Patient states that the pain was identical to when he had a previous gallstone. However, patient has had a cholecystectomy. Patient states that he felt as if he needed to burp, which could have relieved the pain. Patient states that his symptoms have resolved since arrival, prior to receiving the GI cocktail. Patient denies weakness, melena, hematochezia, abdominal pain, and SOB. Patient has a pacemaker. PSHx of heart catheterizations. Cardiologist is Dr. Lovena Le. Patient also reports allergic reaction to contrast, which consists of itching, swelling, hot flashes, and chills. Patient states that he is able to have contrast as long as he takes benadryl before and after. Patient takes omeprazole. Patient was admitted for further evaluation of chest pain.  ED Course: While in the ED, patient was given a GI cocktail. Patient's lab results showed elevated glucose at 144, elevated Hgb at 18.6, and a platelet count of 141. EKG showed sinus rhythm and RBBB. CXR showed no acute cardiopulmonary disease.   Review of Systems: As per HPI otherwise 10 point review of systems negative.   Past Medical History:  Diagnosis Date  . Allergic rhinitis   . Aortic valve disorder   . Arthritis    ostoearthritis  . Asthma    since childhood- seasonal allergies induced  . Atrial fibrillation ([place])   . Carotid artery stenosis   . Diabetes ([place])   . Full dentures   . GERD (gastroesophageal reflux disease)   . H/O hiatal hernia   . Hemorrhage of rectum   .  Hyperlipidemia   . Hypertension   . Hypothyroidism   . 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 OSA (obstructive sleep apnea)    done in [place] 2005- place no longer in business-does not use  . Pacemaker    Oct 2005 in [place].  Marland Kitchen RBBB (right bundle branch block)    Coronary Artherosclerosis  . Sinoatrial node dysfunction (HCC)   . Sinoatrial node dysfunction (HCC)   . Syncope   . Tricuspid valve disorder     Past Surgical History:  Procedure Laterality Date  . A-V CARDIAC PACEMAKER INSERTION     Sick sinus syndrome DDR pacer  . Arthropathy  2005   Rebuilding of left thumb and joint   . CARDIAC CATHETERIZATION    . CARDIAC ELECTROPHYSIOLOGY STUDY AND ABLATION  09/2008   for pvcs, Dr. Loralie Champagne  . CARPAL TUNNEL RELEASE  1994   right wrist  . CARPAL TUNNEL RELEASE  05/04/2012   Procedure: CARPAL TUNNEL RELEASE;  Surgeon: Wynonia Sours, MD;  Location: [place];  Service: Orthopedics;  Laterality: Left;  . CARPOMETACARPEL SUSPENSION PLASTY Right 11/16/2014   Procedure: SUSPENSIONPLASTY RIGHT THUMB TENDON TRANSFER ABDUCTOR POLLICUS LONGUS EXCISION TRAPEZIUM;  Surgeon: Daryll Brod, MD;  Location: [place];  Service: Orthopedics;  Laterality: Right;  . CHOLECYSTECTOMY  1994  . CIRCUMCISION    . COLONOSCOPY N/A 03/14/2013   Procedure: COLONOSCOPY;  Surgeon: Daneil Dolin, MD;  [facility];  Service: Endoscopy;  Laterality: N/A;  8:15 AM  . 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 EYE SURGERY  2012   Left eye Corneal transplant- partial- Cataract  . GALLBLADDER SURGERY  12/01/2006  . HAND TENDON SURGERY Left late 1990's   thumb  . HEMORROIDECTOMY  2003  . left Knee Arthroscopy     [facility]  . PARTIAL KNEE ARTHROPLASTY  11/22/2012   Procedure: UNICOMPARTMENTAL KNEE;  Surgeon: Lorn Junes, MD;  Location: [place];  Service: Orthopedics;  Laterality: Left;  left unicompartmental knee arthroplasty  . PERMANENT PACEMAKER  GENERATOR CHANGE N/A 01/12/2013   Procedure: PERMANENT PACEMAKER GENERATOR CHANGE;  Surgeon: Evans Lance, MD;  Location: [facility] C[facility];  Service: Cardiovascular;  Laterality: N/A;  . Rotator cuff Surgery  2001   Right shoulder  . TONSILLECTOMY     Social History:  reports that he quit smoking about 32 years ago. His smoking use included Cigarettes. He started smoking about 58 years ago. He has a 25.00 pack-year smoking history. He has never used smokeless tobacco. He reports that he drinks about 1.2 oz of alcohol per week . He reports that he does not use drugs.  Allergies  Allergen Reactions  . Food Anaphylaxis and Shortness Of Breath    Tree nuts- breathing difficulty, seafood- breathing difficulty  . Iodinated Diagnostic Agents Hives and Shortness Of Breath    Patient states hives to throat closing.   . Iodine Itching and Other (See Comments)    Reaction unknown  . Clavulanic Acid Diarrhea  . Shellfish Allergy     unknown  . Voltaren [Diclofenac Sodium] Other (See Comments)    Feels like things are crawling on him    Family History  Problem Relation Age of Onset  . Other Father 50    Sudden Cardiac death  . Pancreatic cancer Mother   . Colon cancer Mother   . Colon cancer Maternal Aunt   . Colon polyps Neg Hx    Prior to Admission medications   Medication Sig Start Date End Date Taking? Authorizing Provider  acetaminophen (TYLENOL) 500 MG tablet Take 500 mg by mouth every 6 (six) hours as needed. Pt said he takes 2 500 mg tablets  Every 6-8 hours ( averages 1 gm  Tid) and was told he could do so   Yes Historical Provider, MD  amLODipine (NORVASC) 10 MG tablet Take 10 mg by mouth daily.     Yes Historical Provider, MD  budesonide-formoterol (SYMBICORT) 160-4.5 MCG/ACT inhaler Take 2 puffs first thing in am and then another 2 puffs about 12 hours later. 06/23/16  Yes Tanda Rockers, MD  canagliflozin (INVOKANA) 300 MG TABS tablet Take 300 mg by mouth daily before breakfast.    Yes Historical Provider, MD  cloNIDine (CATAPRES) 0.1 MG tablet Take 0.1 mg by mouth 2 (two) times daily.   Yes Historical Provider, MD  glipiZIDE (GLUCOTROL XL) 2.5 MG 24 hr tablet Take 2.5 mg by mouth daily with breakfast.   Yes Historical Provider, MD  levothyroxine (SYNTHROID, LEVOTHROID) 150 MCG tablet Take 1 tablet by mouth daily. 06/04/16  Yes Historical Provider, MD  losartan-hydrochlorothiazide (HYZAAR) 100-25 MG per tablet Take 1 tablet by mouth daily.   Yes Historical Provider, MD  metoprolol (LOPRESSOR) 100 MG tablet Take 100 mg by mouth 2 (two) times daily.   Yes Historical Provider, MD  Multiple Vitamin (MULTIVITAMIN) tablet Take 1 tablet by mouth daily.     Yes Historical Provider, MD  simvastatin (ZOCOR) 20 MG tablet Take 20 mg  by mouth at bedtime.   Yes Historical Provider, MD  XARELTO 20 MG TABS tablet TAKE 1 TABLET BY MOUTH DAILY WITH SUPPER 11/06/15  Yes Evans Lance, MD  azithromycin [facility]) 250 MG tablet Take 2 on day one then 1 daily x 4 days Patient not taking: Reported on 10/20/2016 09/23/16   Tanda Rockers, MD    Physical Exam: Vitals:   10/20/16 1145 10/20/16 1200 10/20/16 1215 10/20/16 1230  BP:  121/63  133/55  Pulse: 60 60 60 62  Resp: '17 16 12 15  '$ Temp:      TempSrc:      SpO2: (!) 89% 90% (!) 86% (!) 86%  Weight:      Height:          Constitutional: NAD, calm, comfortable Vitals:   10/20/16 1145 10/20/16 1200 10/20/16 1215 10/20/16 1230  BP:  121/63  133/55  Pulse: 60 60 60 62  Resp: '17 16 12 15  '$ Temp:      TempSrc:      SpO2: (!) 89% 90% (!) 86% (!) 86%  Weight:      Height:       Eyes: PERRL, lids and conjunctivae normal ENMT: Mucous membranes are moist. Posterior pharynx clear of any exudate or lesions. Normal dentition. Neck: normal, supple, no masses, no thyromegaly. No bruits or goiter. Respiratory: clear to auscultation bilaterally, no wheezing, no crackles. Normal respiratory effort. No accessory muscle use.  Cardiovascular:  Regular rate and rhythm, no murmurs / rubs / gallops. Mild lower extremity edema. 2+ pedal pulses. No carotid bruits.  Abdomen: no tenderness, no masses palpated. No hepatosplenomegaly. Bowel sounds positive. Not distended. Musculoskeletal: no clubbing / cyanosis. No joint deformity upper and lower extremities. Good ROM, no contractures. Normal muscle tone.  Skin: no rashes, lesions, ulcers. No induration Neurologic: CN 2-12 grossly intact. Sensation intact, DTR normal. Strength 5/5 in all 4.  Psychiatric: Normal judgment and insight. Alert and oriented x 3. Normal mood.    Labs on Admission: I have personally reviewed following labs and imaging studies  CBC:  Recent Labs Lab 10/20/16 0810  WBC 4.8  NEUTROABS 2.7  HGB 18.6*  HCT 53.0*  MCV 90.0  PLT 697*   Basic Metabolic Panel:  Recent Labs Lab 10/20/16 0810  NA 140  K 3.9  CL 103  CO2 30  GLUCOSE 144*  BUN 20  CREATININE 1.00  CALCIUM 9.7   GFR: Estimated Creatinine Clearance: 82.2 mL/min (by C-G formula based on SCr of 1 mg/dL). Liver Function Tests:  Recent Labs Lab 10/20/16 0810  AST 18  ALT 21  ALKPHOS 60  BILITOT 1.0  PROT 7.5  ALBUMIN 4.1    Recent Labs Lab 10/20/16 0810  LIPASE 26   No results for input(s): AMMONIA in the last 168 hours. Coagulation Profile: No results for input(s): INR, PROTIME in the last 168 hours. Cardiac Enzymes:  Recent Labs Lab 10/20/16 0810  TROPONINI <0.03   BNP (last 3 results) No results for input(s): PROBNP in the last 8760 hours. HbA1C: No results for input(s): HGBA1C in the last 72 hours. CBG: No results for input(s): GLUCAP in the last 168 hours. Lipid Profile: No results for input(s): CHOL, HDL, LDLCALC, TRIG, CHOLHDL, LDLDIRECT in the last 72 hours. Thyroid Function Tests: No results for input(s): TSH, T4TOTAL, FREET4, T3FREE, THYROIDAB in the last 72 hours. Anemia Panel: No results for input(s): VITAMINB12, FOLATE, FERRITIN, TIBC, IRON, RETICCTPCT  in the last 72 hours. Urine analysis:  Component Value Date/Time   COLORURINE AMBER (A) 11/15/2012 1051   APPEARANCEUR CLEAR 11/15/2012 1051   LABSPEC 1.029 11/15/2012 1051   PHURINE 6.0 11/15/2012 1051   GLUCOSEU NEGATIVE 11/15/2012 1051   HGBUR NEGATIVE 11/15/2012 1051   BILIRUBINUR SMALL (A) 11/15/2012 1051   KETONESUR 15 (A) 11/15/2012 1051   PROTEINUR 100 (A) 11/15/2012 1051   UROBILINOGEN 1.0 11/15/2012 1051   NITRITE NEGATIVE 11/15/2012 1051   LEUKOCYTESUR SMALL (A) 11/15/2012 1051   Sepsis Labs: !!!!!!!!!!!!!!!!!!!!!!!!!!!!!!!!!!!!!!!!!!!! '@LABRCNTIP'$ (procalcitonin:4,lacticidven:4) )No results found for this or any previous visit (from the past 240 hour(s)).   Radiological Exams on Admission: Dg Chest 2 View  Result Date: 10/20/2016 CLINICAL DATA:  Chest pain EXAM: CHEST  2 VIEW COMPARISON:  06/23/2016 FINDINGS: Duo lead pacemaker unchanged. Heart size within normal limits. Negative for heart failure. Lungs remain clear without infiltrate or effusion. Mild hyperinflation of the lungs. Thoracic degenerative change in spurring. Degenerative change in the shoulder bilaterally. IMPRESSION: No active cardiopulmonary disease. Electronically Signed   By: Franchot Gallo M.D.   On: 10/20/2016 08:35    EKG: Independently reviewed. Paced, unable to interpret.  Assessment/Plan Principal Problem:   Chest pain Active Problems:   PPM-St[place]ude   Sinoatrial node dysfunction (HCC)   Atrial fibrillation (HCC)   Hyperlipidemia   Diabetes (HCC)   Cough variant asthma   HTN (hypertension)   Chest pain. - No history of CAD, although he does have a complicated history of tachy-brady syndrome with a pacemaker. - Will have pacer interrogated. - Admit to telemetry. Cycle troponins. Recheck EKG in the am. - By description, sounds more GI in origin. Will increase omeprazole from '20mg'$  to '40mg'$  a day. - Unable to perform CT chest with contrast to rule out PE or aortic dissection due to severe  contrast dye allergy. If we still have concerns about this after workup, will consider CT with premedication.  HTN.  - Continue Norvasc and clonidine, hyzaar, and lopressor.  HLD.  - Continue statin.  DM. - Check A1C. Continue glipizide. Place on SSI.   Hyperthyroidism. - Check TSH and continue synthroid.   A-fib.  - Rate controlled at present. Continue anticoagulation with Xarelto. Will have pacer interrogated.   DVT prophylaxis: Xarelto Code Status: Full Family Communication: Wife at bedside Disposition Plan: Discharge home once improved Consults called: None Admission status: Observation   Domingo Mend, MD [facility]  If 7PM-7AM, please contact night-coverage [email or web address] Password [facility]  10/20/2016, 2:03 PM   Dr. Willey Blade to assume care in am.  By signing my name below, I, Clerance Lav, attest that this documentation has been prepared under the direction and in the presence of Domingo Mend MD. Electronically signed: Clerance Lav, Scribe. 10/20/16 1:12 PM   I have reviewed the above documentation for accuracy and completeness, and I agree with the above.  Domingo Mend, MD [facility] Pager: [phone]

## 2016-10-21 ENCOUNTER — Encounter (HOSPITAL_COMMUNITY): Payer: Self-pay | Admitting: Cardiology

## 2016-10-21 ENCOUNTER — Observation Stay (HOSPITAL_BASED_OUTPATIENT_CLINIC_OR_DEPARTMENT_OTHER): Payer: Medicare Other

## 2016-10-21 ENCOUNTER — Other Ambulatory Visit: Payer: Self-pay | Admitting: Adult Health

## 2016-10-21 DIAGNOSIS — R079 Chest pain, unspecified: Secondary | ICD-10-CM | POA: Diagnosis not present

## 2016-10-21 DIAGNOSIS — R072 Precordial pain: Secondary | ICD-10-CM

## 2016-10-21 DIAGNOSIS — Z950 Presence of cardiac pacemaker: Secondary | ICD-10-CM

## 2016-10-21 DIAGNOSIS — I480 Paroxysmal atrial fibrillation: Secondary | ICD-10-CM | POA: Diagnosis not present

## 2016-10-21 DIAGNOSIS — I495 Sick sinus syndrome: Secondary | ICD-10-CM

## 2016-10-21 LAB — GLUCOSE, CAPILLARY
Glucose-Capillary: 143 mg/dL — ABNORMAL HIGH (ref 65–99)
Glucose-Capillary: 158 mg/dL — ABNORMAL HIGH (ref 65–99)

## 2016-10-21 LAB — ECHOCARDIOGRAM COMPLETE
Height: 69 in
Weight: 3712 oz

## 2016-10-21 LAB — HEMOGLOBIN A1C
HEMOGLOBIN A1C: 8.1 % — AB (ref 4.8–5.6)
Mean Plasma Glucose: 186 mg/dL

## 2016-10-21 NOTE — Consult Note (Signed)
Requesting provider: Sinda Du, MD Primary cardiologist: Clarence Peru, MD Consulting cardiologist: Clarence Sark, MD  Reason for consultation: Chest pain  Clinical Summary Clarence Dawson is a 70 y.o.male with past medical history outlined below, admitted to the hospital for observation with recent chest discomfort. He has no known history of obstructive CAD or myocardial infarction. He follows with Clarence Dawson with a history of sinus node dysfunction and paroxysmal atrial fibrillation with pacemaker in place. He states that he had returned from a trip to [place] with his wife, was feeling normally and went to bed per usual, awoke around 4 AM with a sharp epigastric to lower sternal discomfort that reminded him of prior pain he had with gallstones. He has not experienced this since having cholecystectomy about 8 years ago. He reports that the symptoms lasted for about 5 hours and spontaneously resolved. It was somewhat better when he stood up and walked around. He has had no recurrence under observation.  ECG shows dual chamber pacing, telemetry does show some episodes of SVT as well as PVCs. He has had no distinct sense of palpitations with his recent chest discomfort. Troponin I levels are normal. Chest x-ray shows no acute findings. He has not undergone any recent ischemic evaluation.   Allergies  Allergen Reactions  . Food Anaphylaxis and Shortness Of Breath    Tree nuts- breathing difficulty, seafood- breathing difficulty  . Iodinated Diagnostic Agents Hives and Shortness Of Breath    Patient states hives to throat closing.   . Iodine Itching and Other (See Comments)    Reaction unknown  . Clavulanic Acid Diarrhea  . Shellfish Allergy     unknown  . Voltaren [Diclofenac Sodium] Other (See Comments)    Feels like things are crawling on him    Medications Scheduled Medications: . amLODipine  10 mg Oral Daily  . canagliflozin  300 mg Oral QAC breakfast  . cloNIDine  0.1  mg Oral BID  . glipiZIDE  2.5 mg Oral Q breakfast  . losartan  100 mg Oral Daily   And  . hydrochlorothiazide  25 mg Oral Daily  . insulin aspart  0-15 Units Subcutaneous TID WC  . insulin aspart  0-5 Units Subcutaneous QHS  . insulin aspart  4 Units Subcutaneous TID WC  . levothyroxine  150 mcg Oral QAC lunch  . metoprolol  100 mg Oral BID  . mometasone-formoterol  2 puff Inhalation BID  . multivitamin with minerals  1 tablet Oral Daily  . rivaroxaban  20 mg Oral Q supper  . simvastatin  20 mg Oral QHS     PRN Medications:  acetaminophen, gi cocktail, ondansetron (ZOFRAN) IV   Past Medical History:  Diagnosis Date  . Allergic rhinitis   . Aortic valve disorder   . Asthma    since childhood- seasonal allergies induced  . Carotid artery stenosis   . Essential hypertension   . Full dentures   . GERD (gastroesophageal reflux disease)   . H/O hiatal hernia   . Hemorrhage of rectum   . Hyperlipidemia   . Hypothyroidism   . 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 OSA (obstructive sleep apnea)   . Osteoarthritis   . Pacemaker    Oct 2005 in [place].  Marland Kitchen PAF (paroxysmal atrial fibrillation) ([place])   . RBBB (right bundle branch block)   . Sinoatrial node dysfunction (HCC)   . Syncope   . Tricuspid valve disorder   . Type 2 diabetes mellitus ([place])  Past Surgical History:  Procedure Laterality Date  . A-V CARDIAC PACEMAKER INSERTION     Sick sinus syndrome DDR pacer  . Arthropathy  2005   Rebuilding of left thumb and joint   . CARDIAC CATHETERIZATION    . CARDIAC ELECTROPHYSIOLOGY STUDY AND ABLATION  09/2008   for pvcs, Dr. Loralie Champagne  . CARPAL TUNNEL RELEASE  1994   right wrist  . CARPAL TUNNEL RELEASE  05/04/2012   Procedure: CARPAL TUNNEL RELEASE;  Surgeon: Wynonia Sours, MD;  Location: [place];  Service: Orthopedics;  Laterality: Left;  . CARPOMETACARPEL SUSPENSION PLASTY Right 11/16/2014   Procedure: SUSPENSIONPLASTY RIGHT THUMB TENDON TRANSFER ABDUCTOR POLLICUS  LONGUS EXCISION TRAPEZIUM;  Surgeon: Daryll Brod, MD;  Location: [place];  Service: Orthopedics;  Laterality: Right;  . CHOLECYSTECTOMY  1994  . CIRCUMCISION    . COLONOSCOPY N/A 03/14/2013   Procedure: COLONOSCOPY;  Surgeon: Daneil Dolin, MD;  [facility];  Service: Endoscopy;  Laterality: N/A;  8:15 AM  . EYE SURGERY     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  . EYE SURGERY  2012   Left eye Corneal transplant- partial- Cataract  . GALLBLADDER SURGERY  12/01/2006  . HAND TENDON SURGERY Left late 1990's   thumb  . HEMORROIDECTOMY  2003  . left Knee Arthroscopy     [facility] 2011- [facility]  . PARTIAL KNEE ARTHROPLASTY  11/22/2012   Procedure: UNICOMPARTMENTAL KNEE;  Surgeon: Lorn Junes, MD;  Location: [place];  Service: Orthopedics;  Laterality: Left;  left unicompartmental knee arthroplasty  . PERMANENT PACEMAKER GENERATOR CHANGE N/A 01/12/2013   Procedure: PERMANENT PACEMAKER GENERATOR CHANGE;  Surgeon: Evans Lance, MD;  Location: [facility] CATH LAB;  Service: Cardiovascular;  Laterality: N/A;  . Rotator cuff Surgery  2001   Right shoulder  . TONSILLECTOMY      Family History  Problem Relation Age of Onset  . Other Father 39    Sudden Cardiac death  . Pancreatic cancer Mother   . Colon cancer Mother   . Colon cancer Maternal Aunt   . Colon polyps Neg Hx     Social History Clarence Dawson reports that he quit smoking about 32 years ago. His smoking use included Cigarettes. He started smoking about 58 years ago. He has a 25.00 pack-year smoking history. He has never used smokeless tobacco. Clarence Dawson reports that he drinks about 1.2 oz of alcohol per week .  Review of Systems Complete review of systems negative except as otherwise outlined in the clinical summary and also the following. Occasional palpitations.  Physical Examination Blood pressure (!) 122/58, pulse 61, temperature 98.4 F (36.9 C), temperature source Oral, resp. rate 16,  height '5\' 9"'$  (1.753 m), weight 232 lb (105.2 kg), SpO2 94 %.  Intake/Output Summary (Last 24 hours) at 10/21/16 0907 Last data filed at 10/20/16 1733  Gross per 24 hour  Intake              120 ml  Output                0 ml  Net              120 ml   Telemetry: Dual-chamber pacing.  Gen.: Overweight male in no distress. HEENT: Conjunctiva and lids normal, oropharynx clear. Neck: Supple, no elevated JVP or carotid bruits, no thyromegaly. Lungs: Clear to auscultation, nonlabored breathing at rest. Cardiac: Regular rate and rhythm, no S3, 2/6 systolic murmur,  no pericardial rub. Abdomen: Soft, nontender, bowel sounds present, no guarding or rebound. Extremities: No pitting edema, distal pulses 2+. Skin: Warm and dry. Musculoskeletal: No kyphosis. Neuropsychiatric: Alert and oriented x3, affect grossly appropriate.  Lab Results  Basic Metabolic Panel:  Recent Labs Lab 10/20/16 0810  NA 140  K 3.9  CL 103  CO2 30  GLUCOSE 144*  BUN 20  CREATININE 1.00  CALCIUM 9.7    Liver Function Tests:  Recent Labs Lab 10/20/16 0810  AST 18  ALT 21  ALKPHOS 60  BILITOT 1.0  PROT 7.5  ALBUMIN 4.1    CBC:  Recent Labs Lab 10/20/16 0810  WBC 4.8  NEUTROABS 2.7  HGB 18.6*  HCT 53.0*  MCV 90.0  PLT 141*    Cardiac Enzymes:  Recent Labs Lab 10/20/16 0810 10/20/16 1701 10/20/16 1947 10/20/16 2242  TROPONINI <0.03 <0.03 <0.03 <0.03   Imaging Chest x-ray 10/20/2016: FINDINGS: Duo lead pacemaker unchanged. Heart size within normal limits. Negative for heart failure. Lungs remain clear without infiltrate or effusion. Mild hyperinflation of the lungs. Thoracic degenerative change in spurring. Degenerative change in the shoulder bilaterally.  IMPRESSION: No active cardiopulmonary disease.  Impression  1. Chest pain syndrome with typical and atypical features, presently resolved under observation. Troponin I levels are normal. ECG shows dual chamber paced  rhythm. Cardiac risk factors include age and gender, hyperlipidemia, hypertension, anti-2 diabetes mellitus. He also has known atherosclerosis in the form of carotid artery disease. He has not undergone any recent ischemic testing.  2. Sinus node dysfunction with history of paroxysmal atrial fibrillation and pacemaker placement, follows with Clarence Dawson. He is maintaining sinus rhythm now and is on Xarelto for stroke prophylaxis. CHADSVASC score is 4.  3. Essential hypertension, currently blood pressure control is reasonable. He is on Norvasc, Lopressor, Hyzaar and clonidine.  4. Hyperlipidemia, on Zocor.  Recommendations  Discussed with patient and wife. He is to undergo echocardiogram today to assess cardiac structure and function. Unless significant abnormalities are uncovered that would require further inpatient evaluation, would anticipate discharge home later today for a follow-up outpatient Lexiscan Myoview and then subsequent office visit to review.  Clarence Dawson, M.D., F.A.C.C.

## 2016-10-21 NOTE — Care Management Obs Status (Signed)
[facility] NOTIFICATION   Patient Details  Name: Clarence Dawson MRN: [id number] Date of Birth: July 20, 1946   Medicare Observation Status Notification Given:  Yes    Sherald Barge, RN 10/21/2016, 2:53 PM

## 2016-10-21 NOTE — Progress Notes (Signed)
Patient with orders to be discharged home.  Patient alert and oriented, accompanied by spouse upon leaving.  Discharge instructions discussed, and questions answered.

## 2016-10-21 NOTE — Progress Notes (Signed)
*  PRELIMINARY RESULTS* Echocardiogram 2D Echocardiogram has been performed.  Clarence Dawson 10/21/2016, 10:39 AM

## 2016-10-21 NOTE — Progress Notes (Signed)
Subjective: He came in with chest discomfort. He is in observation status. He has multiple medical problems and multiple cardiac risk factors but is not known to have any coronary artery disease. He says the pain felt like it did when he passed a gallstone in the past but he has had a cholecystectomy now. He felt like he needed to have  A burp. He's not had any vomiting. No nausea. He is pain-free. He is not having any shortness of breath.  Objective: Vital signs in last 24 hours: Temp:  [98 F (36.7 C)-98.4 F (36.9 C)] 98.4 F (36.9 C) (10/24 0500) Pulse Rate:  [59-65] 61 (10/24 0500) Resp:  [8-19] 16 (10/24 0500) BP: (110-168)/(55-123) 122/58 (10/24 0500) SpO2:  [86 %-96 %] 94 % (10/24 0500) Weight change:  Last BM Date: 10/20/16  Intake/Output from previous day: 10/23 0701 - 10/24 0700 In: 120 [P.O.:120] Out: -   PHYSICAL EXAM General appearance: alert, cooperative and no distress Resp: clear to auscultation bilaterally Cardio: His heart is regular now. He does have a pacemaker. GI: soft, non-tender; bowel sounds normal; no masses,  no organomegaly Extremities: varicose veins noted and No edema  His skin is warm and dry and mucous membranes are moist    Lab Results:  Results for orders placed or performed during the hospital encounter of 10/20/16 (from the past 48 hour(s))  CBC with Differential     Status: Abnormal   Collection Time: 10/20/16  8:10 AM  Result Value Ref Range   WBC 4.8 4.0 - 10.5 K/uL   RBC 5.89 (H) 4.22 - 5.81 MIL/uL   Hemoglobin 18.6 (H) 13.0 - 17.0 g/dL   HCT 53.0 (H) 39.0 - 52.0 %   MCV 90.0 78.0 - 100.0 fL   MCH 31.6 26.0 - 34.0 pg   MCHC 35.1 30.0 - 36.0 g/dL   RDW 12.7 11.5 - 15.5 %   Platelets 141 (L) 150 - 400 K/uL   Neutrophils Relative % 55 %   Neutro Abs 2.7 1.7 - 7.7 K/uL   Lymphocytes Relative 32 %   Lymphs Abs 1.5 0.7 - 4.0 K/uL   Monocytes Relative 10 %   Monocytes Absolute 0.5 0.1 - 1.0 K/uL   Eosinophils Relative 3 %   Eosinophils Absolute 0.1 0.0 - 0.7 K/uL   Basophils Relative 0 %   Basophils Absolute 0.0 0.0 - 0.1 K/uL  Troponin I     Status: None   Collection Time: 10/20/16  8:10 AM  Result Value Ref Range   Troponin I <0.03 <0.03 ng/mL  Comprehensive metabolic panel     Status: Abnormal   Collection Time: 10/20/16  8:10 AM  Result Value Ref Range   Sodium 140 135 - 145 mmol/L   Potassium 3.9 3.5 - 5.1 mmol/L   Chloride 103 101 - 111 mmol/L   CO2 30 22 - 32 mmol/L   Glucose, Bld 144 (H) 65 - 99 mg/dL   BUN 20 6 - 20 mg/dL   Creatinine, Ser 1.00 0.61 - 1.24 mg/dL   Calcium 9.7 8.9 - 10.3 mg/dL   Total Protein 7.5 6.5 - 8.1 g/dL   Albumin 4.1 3.5 - 5.0 g/dL   AST 18 15 - 41 U/L   ALT 21 17 - 63 U/L   Alkaline Phosphatase 60 38 - 126 U/L   Total Bilirubin 1.0 0.3 - 1.2 mg/dL   GFR calc non Af Amer >60 >60 mL/min   GFR calc Af Amer >60 >60 mL/min  Comment: (NOTE) The eGFR has been calculated using the CKD EPI equation. This calculation has not been validated in all clinical situations. eGFR's persistently <60 mL/min signify possible Chronic Kidney Disease.    Anion gap 7 5 - 15  Lipase, blood     Status: None   Collection Time: 10/20/16  8:10 AM  Result Value Ref Range   Lipase 26 11 - 51 U/L  Hemoglobin A1c     Status: Abnormal   Collection Time: 10/20/16  8:10 AM  Result Value Ref Range   Hgb A1c MFr Bld 8.1 (H) 4.8 - 5.6 %    Comment: (NOTE)         Pre-diabetes: 5.7 - 6.4         Diabetes: >6.4         Glycemic control for adults with diabetes: <7.0    Mean Plasma Glucose 186 mg/dL    Comment: (NOTE) Performed At: [facility] [address] [place], [place] [id number] Lindon Romp MD RA:[id number]   Troponin I-serum (0, 3, 6 hours)     Status: None   Collection Time: 10/20/16  5:01 PM  Result Value Ref Range   Troponin I <0.03 <0.03 ng/mL  Troponin I-serum (0, 3, 6 hours)     Status: None   Collection Time: 10/20/16  7:47 PM  Result Value Ref Range    Troponin I <0.03 <0.03 ng/mL  Glucose, capillary     Status: Abnormal   Collection Time: 10/20/16  8:31 PM  Result Value Ref Range   Glucose-Capillary 154 (H) 65 - 99 mg/dL   Comment 1 Notify RN    Comment 2 Document in Chart   Troponin I-serum (0, 3, 6 hours)     Status: None   Collection Time: 10/20/16 10:42 PM  Result Value Ref Range   Troponin I <0.03 <0.03 ng/mL    ABGS No results for input(s): PHART, PO2ART, TCO2, HCO3 in the last 72 hours.  Invalid input(s): PCO2 CULTURES No results found for this or any previous visit (from the past 240 hour(s)). Studies/Results: Dg Chest 2 View  Result Date: 10/20/2016 CLINICAL DATA:  Chest pain EXAM: CHEST  2 VIEW COMPARISON:  06/23/2016 FINDINGS: Duo lead pacemaker unchanged. Heart size within normal limits. Negative for heart failure. Lungs remain clear without infiltrate or effusion. Mild hyperinflation of the lungs. Thoracic degenerative change in spurring. Degenerative change in the shoulder bilaterally. IMPRESSION: No active cardiopulmonary disease. Electronically Signed   By: Franchot Gallo M.D.   On: 10/20/2016 08:35    Medications:  Prior to Admission:  Prescriptions Prior to Admission  Medication Sig Dispense Refill Last Dose  . acetaminophen (TYLENOL) 500 MG tablet Take 500 mg by mouth every 6 (six) hours as needed. Pt said he takes 2 500 mg tablets  Every 6-8 hours ( averages 1 gm  Tid) and was told he could do so   10/19/2016 at Unknown time  . amLODipine (NORVASC) 10 MG tablet Take 10 mg by mouth daily.     10/19/2016 at Unknown time  . budesonide-formoterol (SYMBICORT) 160-4.5 MCG/ACT inhaler Take 2 puffs first thing in am and then another 2 puffs about 12 hours later. 1 Inhaler 11 10/20/2016 at Unknown time  . canagliflozin (INVOKANA) 300 MG TABS tablet Take 300 mg by mouth daily before breakfast.   10/20/2016 at Unknown time  . cloNIDine (CATAPRES) 0.1 MG tablet Take 0.1 mg by mouth 2 (two) times daily.   10/19/2016 at  Unknown time  .  glipiZIDE (GLUCOTROL XL) 2.5 MG 24 hr tablet Take 2.5 mg by mouth daily with breakfast.   10/20/2016 at Unknown time  . levothyroxine (SYNTHROID, LEVOTHROID) 150 MCG tablet Take 1 tablet by mouth daily.   10/20/2016 at Unknown time  . losartan-hydrochlorothiazide (HYZAAR) 100-25 MG per tablet Take 1 tablet by mouth daily.   10/19/2016 at Unknown time  . metoprolol (LOPRESSOR) 100 MG tablet Take 100 mg by mouth 2 (two) times daily.   10/19/2016 at Unknown time  . Multiple Vitamin (MULTIVITAMIN) tablet Take 1 tablet by mouth daily.     10/19/2016 at Unknown time  . simvastatin (ZOCOR) 20 MG tablet Take 20 mg by mouth at bedtime.   10/19/2016 at Unknown time  . XARELTO 20 MG TABS tablet TAKE 1 TABLET BY MOUTH DAILY WITH SUPPER 90 tablet 1 10/19/2016 at 1800  . azithromycin (ZITHROMAX) 250 MG tablet Take 2 on day one then 1 daily x 4 days (Patient not taking: Reported on 10/20/2016) 6 tablet 0 Not Taking at Unknown time   Scheduled: . amLODipine  10 mg Oral Daily  . canagliflozin  300 mg Oral QAC breakfast  . cloNIDine  0.1 mg Oral BID  . glipiZIDE  2.5 mg Oral Q breakfast  . losartan  100 mg Oral Daily   And  . hydrochlorothiazide  25 mg Oral Daily  . insulin aspart  0-15 Units Subcutaneous TID WC  . insulin aspart  0-5 Units Subcutaneous QHS  . insulin aspart  4 Units Subcutaneous TID WC  . levothyroxine  150 mcg Oral QAC lunch  . metoprolol  100 mg Oral BID  . mometasone-formoterol  2 puff Inhalation BID  . multivitamin with minerals  1 tablet Oral Daily  . rivaroxaban  20 mg Oral Q supper  . simvastatin  20 mg Oral QHS   Continuous:  YEM:[id number], gi cocktail, ondansetron (ZOFRAN) IV  Assesment: He was admitted with chest pain which may be GI in origin. However he does have multiple cardiac risk factors including hyperlipidemia hypertension diabetes. He has ruled out for MI. Principal Problem:   Chest pain Active Problems:   PPM-St.Jude   Sinoatrial node  dysfunction (HCC)   Atrial fibrillation (HCC)   Hyperlipidemia   Diabetes (HCC)   Cough variant asthma   HTN (hypertension)    Plan: Echocardiogram. Cardiology consultation. Potential discharge later today depending on cardiology recommendations    LOS: 0 days   Willis Kuipers L 10/21/2016, 8:22 AM

## 2016-10-22 NOTE — Discharge Summary (Signed)
Physician Discharge Summary  Patient ID: Clarence Dawson MRN: [id number] DOB/AGE: 70-Oct-1947 70 y.o. Primary Care Physician:FAGAN,ROY, MD Admit date: 10/20/2016 Discharge date: 10/22/2016    Discharge Diagnoses:   Principal Problem:   Chest pain Active Problems:   PPM-St.Jude   Sinoatrial node dysfunction (HCC)   Atrial fibrillation (HCC)   Hyperlipidemia   Diabetes (HCC)   Cough variant asthma   HTN (hypertension)     Medication List    TAKE these medications   acetaminophen 500 MG tablet Commonly known as:  TYLENOL Take 500 mg by mouth every 6 (six) hours as needed. Pt said he takes 2 500 mg tablets  Every 6-8 hours ( averages 1 gm  Tid) and was told he could do so   amLODipine 10 MG tablet Commonly known as:  NORVASC Take 10 mg by mouth daily.   azithromycin 250 MG tablet Commonly known as:  ZITHROMAX Take 2 on day one then 1 daily x 4 days   budesonide-formoterol 160-4.5 MCG/ACT inhaler Commonly known as:  SYMBICORT Take 2 puffs first thing in am and then another 2 puffs about 12 hours later.   cloNIDine 0.1 MG tablet Commonly known as:  CATAPRES Take 0.1 mg by mouth 2 (two) times daily.   glipiZIDE 2.5 MG 24 hr tablet Commonly known as:  GLUCOTROL XL Take 2.5 mg by mouth daily with breakfast.   INVOKANA 300 MG Tabs tablet Generic drug:  canagliflozin Take 300 mg by mouth daily before breakfast.   levothyroxine 150 MCG tablet Commonly known as:  SYNTHROID, LEVOTHROID Take 1 tablet by mouth daily.   losartan-hydrochlorothiazide 100-25 MG tablet Commonly known as:  HYZAAR Take 1 tablet by mouth daily.   metoprolol 100 MG tablet Commonly known as:  LOPRESSOR Take 100 mg by mouth 2 (two) times daily.   multivitamin tablet Take 1 tablet by mouth daily.   simvastatin 20 MG tablet Commonly known as:  ZOCOR Take 20 mg by mouth at bedtime.   XARELTO 20 MG Tabs tablet Generic drug:  rivaroxaban TAKE 1 TABLET BY MOUTH DAILY WITH SUPPER        Discharged Condition:Improved    Consults: Cardiology  Significant Diagnostic Studies: Dg Chest 2 View  Result Date: 10/20/2016 CLINICAL DATA:  Chest pain EXAM: CHEST  2 VIEW COMPARISON:  06/23/2016 FINDINGS: Duo lead pacemaker unchanged. Heart size within normal limits. Negative for heart failure. Lungs remain clear without infiltrate or effusion. Mild hyperinflation of the lungs. Thoracic degenerative change in spurring. Degenerative change in the shoulder bilaterally. IMPRESSION: No active cardiopulmonary disease. Electronically Signed   By: Franchot Gallo M.D.   On: 10/20/2016 08:35    Lab Results: Basic Metabolic Panel:  Recent Labs  10/20/16 0810  NA 140  K 3.9  CL 103  CO2 30  GLUCOSE 144*  BUN 20  CREATININE 1.00  CALCIUM 9.7   Liver Function Tests:  Recent Labs  10/20/16 0810  AST 18  ALT 21  ALKPHOS 60  BILITOT 1.0  PROT 7.5  ALBUMIN 4.1     CBC:  Recent Labs  10/20/16 0810  WBC 4.8  NEUTROABS 2.7  HGB 18.6*  HCT 53.0*  MCV 90.0  PLT 141*    No results found for this or any previous visit (from the past 240 hour(s)).   Hospital Course: This is a 70 year old who was in his usual state of fair health at home when he developed chest discomfort. He said this felt like it did when he had  passed gallstones in the past but he does not have a gallbladder, after cholecystectomy. He had pain in his substernal area that radiated to some extent to both shoulder blades. Although he is not known to have coronary artery occlusive disease he does have multiple cardiac risk factors. He has a permanent pacemaker. He ruled out for MI. Cardiology consultation was obtained. Echocardiogram did not show wall motion abnormality so it was felt that he could be discharged home in improved condition for follow-up outpatient cardiac ischemic testing.  Discharge Exam: Blood pressure (!) 122/58, pulse 61, temperature 98.4 F (36.9 C), temperature source Oral, resp. rate  16, height '5\' 9"'$  (1.753 m), weight 105.2 kg (232 lb), SpO2 94 %. He's awake and alert and in no acute distress. Chest is clear. His heart is regular.  Disposition: Home follow up with cardiology and with Dr. Willey Blade    Follow-up Information    Jory Sims, NP Follow up on 10/30/2016.   Specialties:  Nurse Practitioner, Radiology, Cardiology Why:  2:50 pm Contact information: [place] [phone]           Signed: Breylan Lefevers L   10/22/2016, 7:44 AM

## 2016-10-29 ENCOUNTER — Encounter (HOSPITAL_COMMUNITY): Payer: Self-pay

## 2016-10-29 ENCOUNTER — Inpatient Hospital Stay (HOSPITAL_COMMUNITY): Admit: 2016-10-29 | Payer: Medicare Other

## 2016-10-29 ENCOUNTER — Encounter (HOSPITAL_COMMUNITY)
Admission: RE | Admit: 2016-10-29 | Discharge: 2016-10-29 | Disposition: A | Discharge status: Home / Self Care | Payer: Medicare Other | Source: Ambulatory Visit | Attending: Adult Health | Admitting: Adult Health

## 2016-10-29 DIAGNOSIS — R079 Chest pain, unspecified: Secondary | ICD-10-CM | POA: Insufficient documentation

## 2016-10-29 LAB — NM MYOCAR MULTI W/SPECT W/WALL MOTION / EF
CHL CUP NUCLEAR SDS: 0
CHL CUP NUCLEAR SRS: 0
CHL CUP RESTING HR STRESS: 60 {beats}/min
LHR: 0.3
LV dias vol: 82 mL (ref 62–150)
LV sys vol: 24 mL
NUC STRESS TID: 1.06
Peak HR: 80 {beats}/min
SSS: 0

## 2016-10-29 MED ORDER — TECHNETIUM TC 99M TETROFOSMIN IV KIT
30.0000 | PACK | Freq: Once | INTRAVENOUS | Status: AC | PRN
  Administered 2016-10-29: 33 via INTRAVENOUS

## 2016-10-29 MED ORDER — REGADENOSON 0.4 MG/5ML IV SOLN
INTRAVENOUS | Status: AC
  Administered 2016-10-29: 0.4 mg via INTRAVENOUS
  Filled 2016-10-29: qty 5

## 2016-10-29 MED ORDER — TECHNETIUM TC 99M TETROFOSMIN IV KIT
10.0000 | PACK | Freq: Once | INTRAVENOUS | Status: AC | PRN
  Administered 2016-10-29: 11 via INTRAVENOUS

## 2016-10-29 MED ORDER — SODIUM CHLORIDE 0.9% FLUSH
INTRAVENOUS | Status: AC
  Administered 2016-10-29: 10 mL via INTRAVENOUS
  Filled 2016-10-29: qty 10

## 2016-10-30 ENCOUNTER — Encounter: Payer: Medicare Other | Admitting: Adult Health

## 2016-10-31 ENCOUNTER — Encounter: Payer: Self-pay | Admitting: Internal Medicine

## 2016-10-31 ENCOUNTER — Ambulatory Visit (INDEPENDENT_AMBULATORY_CARE_PROVIDER_SITE_OTHER): Payer: Medicare Other | Admitting: Internal Medicine

## 2016-10-31 VITALS — BP 122/66 | HR 78 | Ht 69.0 in | Wt 244.4 lb

## 2016-10-31 DIAGNOSIS — Z950 Presence of cardiac pacemaker: Secondary | ICD-10-CM

## 2016-10-31 DIAGNOSIS — I4891 Unspecified atrial fibrillation: Secondary | ICD-10-CM

## 2016-10-31 NOTE — Patient Instructions (Addendum)
Medication Instructions:  Your physician recommends that you continue on your current medications as directed. Please refer to the Current Medication list given to you today.   Labwork: None Ordered   Testing/Procedures: None Ordered   Follow-Up: Your physician wants you to follow-up in: 1 year with Dr. Knox Saliva will receive a reminder letter in the mail two months in advance. If you don't receive a letter, please call our office to schedule the follow-up appointment.  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 This monitoring reduces the number of office visits required to check your device to one time per year. It allows [place] to keep an eye on the functioning of your device to ensure it is working properly. You are scheduled for a device check from home on 02/02/17. You may send your transmission at any time that day. If you have a wireless device, the transmission will be sent automatically. After your physician reviews your transmission, you will receive a postcard with your next transmission date.    Any Other Special Instructions Will Be Listed Below (If Applicable).     If you need a refill on your cardiac medications before your next appointment, please call your pharmacy.

## 2016-10-31 NOTE — Progress Notes (Signed)
HPI [name] returns today for followup. He is a pleasant 70 yo man with a h/o symptomatic bradycardia due to sinus node dysfunction, s/p PPM. He underwent generator change over 5 years ago. No chest pain or sob. No syncope. He admits to sodium indiscretion. He does not experience palpitations.  Allergies  Allergen Reactions  . Food Anaphylaxis and Shortness Of Breath    Tree nuts- breathing difficulty, seafood- breathing difficulty  . Iodinated Diagnostic Agents Hives and Shortness Of Breath    Patient states hives to throat closing.   . Iodine Itching and Other (See Comments)    Reaction unknown  . Clavulanic Acid Diarrhea  . Shellfish Allergy     unknown  . Voltaren [Diclofenac Sodium] Other (See Comments)    Feels like things are crawling on him     Current Outpatient Prescriptions  Medication Sig Dispense Refill  . acetaminophen (TYLENOL) 500 MG tablet Take 500 mg by mouth every 6 (six) hours as needed. Pt said he takes 2 500 mg tablets  Every 6-8 hours ( averages 1 gm  Tid) and was told he could do so    . amLODipine (NORVASC) 10 MG tablet Take 10 mg by mouth daily.      . budesonide-formoterol (SYMBICORT) 160-4.5 MCG/ACT inhaler Take 2 puffs first thing in am and then another 2 puffs about 12 hours later. 1 Inhaler 11  . canagliflozin (INVOKANA) 300 MG TABS tablet Take 300 mg by mouth daily before breakfast.    . cloNIDine (CATAPRES) 0.1 MG tablet Take 0.1 mg by mouth 2 (two) times daily.    Marland Kitchen glipiZIDE (GLUCOTROL XL) 2.5 MG 24 hr tablet Take 2.5 mg by mouth daily with breakfast.    . levothyroxine (SYNTHROID, LEVOTHROID) 150 MCG tablet Take 1 tablet by mouth daily.    Marland Kitchen losartan-hydrochlorothiazide (HYZAAR) 100-25 MG per tablet Take 1 tablet by mouth daily.    . metoprolol (LOPRESSOR) 100 MG tablet Take 100 mg by mouth 2 (two) times daily.    . Multiple Vitamin (MULTIVITAMIN) tablet Take 1 tablet by mouth daily.      . simvastatin (ZOCOR) 20 MG tablet Take 20 mg by mouth at  bedtime.    Alveda Reasons 20 MG TABS tablet TAKE 1 TABLET BY MOUTH DAILY WITH SUPPER 90 tablet 1   No current facility-administered medications for this visit.      Past Medical History:  Diagnosis Date  . Allergic rhinitis   . Aortic valve disorder   . Asthma    since childhood- seasonal allergies induced  . Carotid artery stenosis   . Essential hypertension   . Full dentures   . GERD (gastroesophageal reflux disease)   . H/O hiatal hernia   . Hemorrhage of rectum   . Hyperlipidemia   . Hypothyroidism   . 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 OSA (obstructive sleep apnea)   . Osteoarthritis   . Pacemaker    Oct 2005 in [place].  Marland Kitchen PAF (paroxysmal atrial fibrillation) ([place])   . RBBB (right bundle branch block)   . Sinoatrial node dysfunction (HCC)   . Syncope   . Tricuspid valve disorder   . Type 2 diabetes mellitus (HCC)     ROS:   All systems reviewed and negative except as noted in the HPI.   Past Surgical History:  Procedure Laterality Date  . A-V CARDIAC PACEMAKER INSERTION     Sick sinus syndrome DDR pacer  . Arthropathy  2005   Rebuilding of left thumb  and joint   . CARDIAC CATHETERIZATION    . CARDIAC ELECTROPHYSIOLOGY STUDY AND ABLATION  09/2008   for pvcs, Dr. Loralie Champagne  . CARPAL TUNNEL RELEASE  1994   right wrist  . CARPAL TUNNEL RELEASE  05/04/2012   Procedure: CARPAL TUNNEL RELEASE;  Surgeon: Wynonia Sours, MD;  Location: [facility];  Service: Orthopedics;  Laterality: Left;  . CARPOMETACARPEL SUSPENSION PLASTY Right 11/16/2014   Procedure: SUSPENSIONPLASTY RIGHT THUMB TENDON TRANSFER ABDUCTOR POLLICUS LONGUS EXCISION TRAPEZIUM;  Surgeon: Daryll Brod, MD;  Location: [facility];  Service: Orthopedics;  Laterality: Right;  . CHOLECYSTECTOMY  1994  . CIRCUMCISION    . COLONOSCOPY N/A 03/14/2013   Procedure: COLONOSCOPY;  Surgeon: Daneil Dolin, MD;  [facility];  Service: Endoscopy;  Laterality: N/A;  8:15 AM  . 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 EYE SURGERY  2012   Left eye Corneal transplant- partial- Cataract  . GALLBLADDER SURGERY  12/01/2006  . HAND TENDON SURGERY Left late 1990's   thumb  . HEMORROIDECTOMY  2003  . left Knee Arthroscopy     [facility]  . PARTIAL KNEE ARTHROPLASTY  11/22/2012   Procedure: UNICOMPARTMENTAL KNEE;  Surgeon: Lorn Junes, MD;  Location: [facility];  Service: Orthopedics;  Laterality: Left;  left unicompartmental knee arthroplasty  . PERMANENT PACEMAKER GENERATOR CHANGE N/A 01/12/2013   Procedure: PERMANENT PACEMAKER GENERATOR CHANGE;  Surgeon: Evans Lance, MD;  Location: [facility] C[facility];  Service: Cardiovascular;  Laterality: N/A;  . Rotator cuff Surgery  2001   Right shoulder  . TONSILLECTOMY       Family History  Problem Relation Age of Onset  . Other Father 57    Sudden Cardiac death  . Pancreatic cancer Mother   . Colon cancer Mother   . Colon cancer Maternal Aunt   . Colon polyps Neg Hx      Social History   Social History  . Marital status: Widowed    Spouse name: N/A  . Number of children: N/A  . Years of education: N/A   Occupational History  . Not on file.   Social History Main Topics  . Smoking status: Former Smoker    Packs/day: 1.00    Years: 25.00    Types: Cigarettes    Start date: 02/20/1958    Quit date: 02/12/1984  . Smokeless tobacco: Never Used  . Alcohol use 1.2 oz/week    1 Glasses of wine, 1 Cans of beer per week     Comment: 1 can of beer or glasse of wine daily  . Drug use: No  . Sexual activity: Not on file   Other Topics Concern  . Not on file   Social History Narrative   Regular exercise: No     BP 122/66   Pulse 78   Ht '5\' 9"'$  (1.753 m)   Wt 244 lb 6.4 oz (110.9 kg)   SpO2 95%   BMI 36.09 kg/m   Physical Exam:  Well appearing 70 yo man, NAD HEENT: Unremarkable Neck:  7 cm JVD, no thyromegally Lungs:  Clear with no wheezes, well healed PPM incision. HEART:   Regular rate rhythm, no murmurs, no rubs, no clicks Abd:  soft, positive bowel sounds, no organomegally, no rebound, no guarding Ext:  2 plus pulses, no edema, no cyanosis, no clubbing Skin:  No rashes no nodules Neuro:  CN II through XII  intact, motor grossly intact  DEVICE  Normal device function.  See PaceArt for details.   Assess/Plan: 1. PAF - he has maintained NSR.  2. HTN - he will continue his current medical regimen. His pressures have been good. 3. Sinus node dysfunction - he is s/p PPM and is asymptomatic.  4. HTN - his blood pressure is well controlled. Continue current meds.  Mikle Bosworth.D.

## 2016-11-02 NOTE — Progress Notes (Signed)
Normal stress test. No changes in regimen

## 2016-11-04 LAB — CUP PACEART INCLINIC DEVICE CHECK
Brady Statistic RV Percent Paced: 3.5 %
Date Time Interrogation Session: 20171103142738
Implantable Lead Implant Date: 20051106
Implantable Lead Location: 753859
Lead Channel Pacing Threshold Amplitude: 0.5 V
Lead Channel Pacing Threshold Amplitude: 0.75 V
Lead Channel Pacing Threshold Pulse Width: 0.4 ms
Lead Channel Sensing Intrinsic Amplitude: 11.3 mV
Lead Channel Setting Pacing Pulse Width: 0.4 ms
Lead Channel Setting Sensing Sensitivity: 2 mV
MDC IDC LEAD IMPLANT DT: 20051106
MDC IDC LEAD LOCATION: 753860
MDC IDC MSMT BATTERY REMAINING LONGEVITY: 116 mo
MDC IDC MSMT BATTERY VOLTAGE: 2.98 V
MDC IDC MSMT LEADCHNL RA IMPEDANCE VALUE: 387.5 Ohm
MDC IDC MSMT LEADCHNL RA SENSING INTR AMPL: 4.1 mV
MDC IDC MSMT LEADCHNL RV IMPEDANCE VALUE: 650 Ohm
MDC IDC MSMT LEADCHNL RV PACING THRESHOLD PULSEWIDTH: 0.4 ms
MDC IDC PG IMPLANT DT: 20140115
MDC IDC PG SERIAL: 7439597
MDC IDC SET LEADCHNL RA PACING AMPLITUDE: 2 V
MDC IDC SET LEADCHNL RV PACING AMPLITUDE: 2.5 V
MDC IDC STAT BRADY RA PERCENT PACED: 86 %

## 2016-11-07 DIAGNOSIS — E119 Type 2 diabetes mellitus without complications: Secondary | ICD-10-CM | POA: Diagnosis not present

## 2016-11-11 DIAGNOSIS — S46012A Strain of muscle(s) and tendon(s) of the rotator cuff of left shoulder, initial encounter: Secondary | ICD-10-CM | POA: Diagnosis not present

## 2016-11-11 DIAGNOSIS — Z96652 Presence of left artificial knee joint: Secondary | ICD-10-CM | POA: Diagnosis not present

## 2016-11-14 DIAGNOSIS — I10 Essential (primary) hypertension: Secondary | ICD-10-CM | POA: Diagnosis not present

## 2016-11-14 DIAGNOSIS — R05 Cough: Secondary | ICD-10-CM | POA: Diagnosis not present

## 2016-11-14 DIAGNOSIS — Z23 Encounter for immunization: Secondary | ICD-10-CM | POA: Diagnosis not present

## 2016-11-14 DIAGNOSIS — E1129 Type 2 diabetes mellitus with other diabetic kidney complication: Secondary | ICD-10-CM | POA: Diagnosis not present

## 2016-12-02 ENCOUNTER — Encounter: Payer: Self-pay | Admitting: Adult Health

## 2016-12-02 ENCOUNTER — Ambulatory Visit (INDEPENDENT_AMBULATORY_CARE_PROVIDER_SITE_OTHER): Payer: Medicare Other | Admitting: Adult Health

## 2016-12-02 DIAGNOSIS — J45991 Cough variant asthma: Secondary | ICD-10-CM | POA: Diagnosis not present

## 2016-12-02 MED ORDER — LEVALBUTEROL HCL 0.63 MG/3ML IN NEBU
0.6300 mg | INHALATION_SOLUTION | Freq: Once | RESPIRATORY_TRACT | Status: AC
  Administered 2016-12-02: 0.63 mg via RESPIRATORY_TRACT

## 2016-12-02 MED ORDER — HYDROCODONE-HOMATROPINE 5-1.5 MG/5ML PO SYRP: 5.0000 mL | ORAL_SOLUTION | Freq: Four times a day (QID) | ORAL | 0 refills | Status: DC | PRN

## 2016-12-02 MED ORDER — PREDNISONE 10 MG PO TABS: ORAL_TABLET | ORAL | 0 refills | Status: DC

## 2016-12-02 NOTE — Assessment & Plan Note (Signed)
Flare with bronchitis   Plan  Patient Instructions  Prednisone taper over next week.  Zpack take as directed,- use the one you have at home.  Mucinex Twice daily  As needed  As needed  Cough/congestion  Begin Delsym 2 tsp Twice daily  For cough.  Add Zyrtec '10mg'$  At bedtime  As needed  Drainage .  Hydromet 1 tsp every 6hr as needed for cough , may make you sleepy  Please contact office for sooner follow up if symptoms do not improve or worsen or seek emergency care  follow up Dr. Melvyn Novas  As planned and As needed

## 2016-12-02 NOTE — Progress Notes (Signed)
Subjective:    Patient ID: Clarence Dawson, male    DOB: [birth year],     MRN: [id number]    Brief patient profile:  82 yowm quit smoking 1985 onset of breathing problems mid 2000s rx advair and helped a lot until around spring 2016 with cough varaiably productive not responding to abx x 2 / antiviral and referred to pulmonary clinic 01/24/2016 by Dr Willey Blade for refractory cough.   Brief patient profile:  01/24/2016 1st [facility] office visit/ Wert  maint rx = advair 250 Chief Complaint  Patient presents with  . PULMONARY CONSULT    Referred by Dr. [name]. Pt c/o cough that is sometimes productive with white-brown thick mucus and some wheeze. Pt denies SOB/CP/tightness. Pt does have history of asthma since childhood. Pt is not on maintenance inhalers.   cough is worse before supper until 10 pm goes to sleep and does fine and then recurs p stirs each am  Not limited by breathing from desired activities   rec Stop lopressor and replace with bystolic 10 mg twice daily  Stop advair > Prednisone 10 mg take  4 each am x 2 days,   2 each am x 2 days,  1 each am x 2 days and stop  Pantoprazole (protonix) 40 mg   Take  30-60 min before first meal of the day and Pepcid (famotidine)  20 mg one @  bedtime until return to office - this is the best way to tell whether stomach acid is contributing to your problem.   GERD diet   02/12/2016  f/u ov/Wert re:  Cough variant asthma vs uacs  Chief Complaint  Patient presents with  . Cough    Cough is improved since last OV.   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  Work on inhaler technique:   Ok to change back to [employer] to change back to the ranitidine when you finish up the pantoprazole     03/11/2016  f/u ov/Wert re:  Chronic asthma/  symbicort 160 2bid   Chief Complaint  Patient presents with  . Follow-up    Cough had resolved until a  few days ago. He has minimal cough with green sputum.    cough worse in am/ ? Caught cold but notes top dentures don't fit whenever "has a cold" and now green mucus each am  Thinks symbicort much better than advair  rec Plan A = Automatic = Symbicort 80 Take 2 puffs first thing in am and then another 2 puffs about 12 hours later.  Plan B = Backup Only use your albuterol (proventil) as a rescue medication Please see patient coordinator before you leave today  to schedule sinus CT > neg Augmentin 875 mg take one pill twice daily  X 10 days> only took a few then stopped due to diarrhea  Cruise May 19 2016 > cough persisted  Knee surgery May 24/2017      06/23/2016  f/u ov/Wert re:  Recurrent cough x one year Chief Complaint  Patient presents with  . Follow-up    Pt states that his cough is sometimes better and sometimes worse. Cough is prod with thick, light brown sputum.   cough is highly variable symbiocrt 80 2 every 12 hours / prn proventil  Tends to be worse p stirring in am for first hours  and one hour before hs  Always better when has been rx'd with prednisone / ? Better on 160 vs 80  rec Change symbicort to 160 Take 2 puffs first thing in am and then another 2 puffs about 12 hours later.  If not better Prednisone 10 mg take  4 each am x 2 days,   2 each am x 2 days,  1 each am x 2 days and stop  Please remember to go to the  x-ray department downstairs for your tests - we will call you with the results when they are available.    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 Follow-up    Pt c/o increased cough for the past wk, started while he was at the beach. He is coughing up brown sputum.     >>zpack to have on hold   12/02/2016 Acute OV :  Pt presents for an acute office visit. Complains of 5 days of cough , congestion, drainage and wheezing . Coughing but hard to get mucus up . Not using otc  meds.  Denies chest pain, orthopnea, edema or fever.  Appetite is good w/ no nv;d.  Remains on Symbicort .      Current Medications, Allergies, Complete Past Medical History, Past Surgical History, Family History, and Social History were reviewed in [employer] record.  ROS  The following are not active complaints unless bolded sore throat, dysphagia, dental problems, itching, sneezing,  nasal congestion or excess/ purulent secretions, ear ache,   fever, chills, sweats, unintended wt loss, classically pleuritic or exertional cp, hemoptysis,  orthopnea pnd or leg swelling, presyncope, palpitations, abdominal pain, anorexia, nausea, vomiting, diarrhea  or change in bowel or bladder habits, change in stools or urine, dysuria,hematuria,  rash, arthralgias, visual complaints, headache, numbness, weakness or ataxia or problems with walking or coordination,  change in mood/affect or memory.                Objective:   Physical Exam  Obese   amb wm nad  02/12/2016        241 > 03/11/2016 243 > 06/23/2016  241 > 09/23/2016  241  Vitals:   12/02/16 1048  BP: 140/70  Pulse: 64  Temp: 98.1 F (36.7 C)  TempSrc: Oral  SpO2: 93%  Weight: 242 lb 3.2 oz (109.9 kg)  Height: '5\' 9"'$  (1.753 m)      Vital signs reviewed     HEENT: nl dentition, turbinates, and oropharynx. Nl external ear canals without cough reflex   NECK :  without JVD/Nodes/TM/ nl carotid upstrokes bilaterally   LUNGS: no acc muscle use,  Nl contour chest with  Exp wheezing    CV:  RRR  no s3 or murmur or increase in P2, no edema   ABD:  soft and nontender with nl inspiratory excursion in the supine position. No bruits or organomegaly, bowel sounds nl  MS:  Nl gait/ ext warm without deformities, calf tenderness, cyanosis or clubbing No obvious joint restrictions   SKIN: warm and dry without lesions    NEURO:  alert, approp, nl sensorium with  no motor deficits       CXR PA and Lateral:   10/20/16   No acute cardiopulmonary disease. Cardiac pacer stable position. Heart size is stable.            Assessment & Plan:

## 2016-12-02 NOTE — Addendum Note (Signed)
Addended by: Doroteo Glassman D on: 12/02/2016 12:01 PM   Modules accepted: Orders

## 2016-12-02 NOTE — Patient Instructions (Addendum)
Prednisone taper over next week.  Zpack take as directed,- use the one you have at home.  Mucinex Twice daily  As needed  As needed  Cough/congestion  Begin Delsym 2 tsp Twice daily  For cough.  Add Zyrtec '10mg'$  At bedtime  As needed  Drainage .  Hydromet 1 tsp every 6hr as needed for cough , may make you sleepy  Please contact office for sooner follow up if symptoms do not improve or worsen or seek emergency care  follow up Dr. Melvyn Novas  As planned and As needed

## 2016-12-02 NOTE — Progress Notes (Signed)
Chart and office note reviewed in detail  > agree with a/p as outlined    

## 2016-12-03 NOTE — Progress Notes (Signed)
Chart and office note reviewed in detail  > agree with a/p as outlined    

## 2016-12-04 ENCOUNTER — Telehealth: Payer: Self-pay | Admitting: Adult Health

## 2016-12-04 NOTE — Telephone Encounter (Signed)
Gave him max meds 2 days ago , not sure why he is not improving  Will need to be seen if worse  See if Dr. Melvyn Novas  Can see him tomorrow.  Please contact office for sooner follow up if symptoms do not improve or worsen or seek emergency care

## 2016-12-04 NOTE — Telephone Encounter (Signed)
LMTCB for pt 

## 2016-12-04 NOTE — Telephone Encounter (Signed)
Spoke with pt. He states that his night time coughing has returned and is worse. Pt had an appointment with TP on 12/02/16, was given a breathing treatment and felt much better. He is requesting that we order a new nebulizer machine and prescribe albuterol nebulizer medication.  TP - please advise. Thanks.

## 2016-12-05 ENCOUNTER — Encounter: Payer: Self-pay | Admitting: Internal Medicine

## 2016-12-05 ENCOUNTER — Ambulatory Visit (INDEPENDENT_AMBULATORY_CARE_PROVIDER_SITE_OTHER): Payer: Medicare Other | Admitting: Internal Medicine

## 2016-12-05 ENCOUNTER — Ambulatory Visit (INDEPENDENT_AMBULATORY_CARE_PROVIDER_SITE_OTHER)
Admission: RE | Admit: 2016-12-05 | Discharge: 2016-12-05 | Disposition: A | Discharge status: Home / Self Care | Payer: Medicare Other | Source: Ambulatory Visit | Attending: Internal Medicine | Admitting: Internal Medicine

## 2016-12-05 VITALS — BP 112/68 | HR 60 | Temp 98.5°F | Ht 69.0 in | Wt 247.0 lb

## 2016-12-05 DIAGNOSIS — J4531 Mild persistent asthma with (acute) exacerbation: Secondary | ICD-10-CM

## 2016-12-05 DIAGNOSIS — R0602 Shortness of breath: Secondary | ICD-10-CM | POA: Diagnosis not present

## 2016-12-05 DIAGNOSIS — I10 Essential (primary) hypertension: Secondary | ICD-10-CM | POA: Diagnosis not present

## 2016-12-05 DIAGNOSIS — R05 Cough: Secondary | ICD-10-CM | POA: Diagnosis not present

## 2016-12-05 DIAGNOSIS — J45991 Cough variant asthma: Secondary | ICD-10-CM

## 2016-12-05 MED ORDER — AZITHROMYCIN 250 MG PO TABS: ORAL_TABLET | ORAL | 0 refills | Status: DC

## 2016-12-05 MED ORDER — BISOPROLOL FUMARATE 5 MG PO TABS: ORAL_TABLET | ORAL | 11 refills | Status: DC

## 2016-12-05 MED ORDER — ALBUTEROL SULFATE 0.63 MG/3ML IN NEBU: 1.0000 | INHALATION_SOLUTION | RESPIRATORY_TRACT | 1 refills | Status: DC | PRN

## 2016-12-05 NOTE — Telephone Encounter (Signed)
Pt scheduled for acute visit with MW on 12/05/16 @ 10:30am Pt voiced understanding and had no further questions. Nothing further needed.

## 2016-12-05 NOTE — Progress Notes (Signed)
Subjective:    Patient ID: Clarence Dawson, male    DOB: June 17, 1946,     MRN: [id number]    Brief patient profile:  43 yowm quit smoking 1985 onset of breathing problems mid 2000s rx advair and helped a lot until around spring 2016 with cough varaiably productive not responding to abx x 2 / antiviral and referred to pulmonary clinic 01/24/2016 by Clarence Dawson for refractory cough.   Brief patient profile:  01/24/2016 1st [facility] office visit/ Clarence Dawson  maint rx = advair 250 Chief Complaint  Patient presents with  . PULMONARY CONSULT    Referred by Clarence Dawson. Pt c/o cough that is sometimes productive with white-brown thick mucus and some wheeze. Pt denies SOB/CP/tightness. Pt does have history of asthma since childhood. Pt is not on maintenance inhalers.   cough is worse before supper until 10 pm goes to sleep and does fine and then recurs p stirs each am  Not limited by breathing from desired activities   rec Stop lopressor and replace with bystolic 10 mg twice daily  Stop advair > Prednisone 10 mg take  4 each am x 2 days,   2 each am x 2 days,  1 each am x 2 days and stop  Pantoprazole (protonix) 40 mg   Take  30-60 min before first meal of the day and Pepcid (famotidine)  20 mg one @  bedtime until return to office - this is the best way to tell whether stomach acid is contributing to your problem.   GERD diet   02/12/2016  f/u ov/Clarence Dawson re:  Cough variant asthma vs uacs  Chief Complaint  Patient presents with  . Cough    Cough is improved since last OV.   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  Work on inhaler technique:   Ok to change back to [employer] to change back to the ranitidine when you finish up the pantoprazole     03/11/2016  f/u ov/Clarence Dawson re:  Chronic asthma/  symbicort 160 2bid   Chief Complaint  Patient presents with  . Follow-up    Cough had resolved until a  few days ago. He has minimal cough with green sputum.    cough worse in am/ ? Caught cold but notes top dentures don't fit whenever "has a cold" and now green mucus each am  Thinks symbicort much better than advair  rec Plan A = Automatic = Symbicort 80 Take 2 puffs first thing in am and then another 2 puffs about 12 hours later.  Plan B = Backup Only use your albuterol (proventil) as a rescue medication Please see patient coordinator before you leave today  to schedule sinus CT > neg Augmentin 875 mg take one pill twice daily  X 10 days> only took a few then stopped due to diarrhea  Cruise May 19 2016 > cough persisted  Knee surgery May 24/2017      06/23/2016  f/u ov/Clarence Dawson re:  Recurrent cough x one year Chief Complaint  Patient presents with  . Follow-up    Pt states that his cough is sometimes better and sometimes worse. Cough is prod with thick, light brown sputum.   cough is highly variable symbiocrt 80 2 every 12 hours / prn proventil  Tends to be worse p stirring in am for first hours  and one hour before hs  Always better when has been rx'd with prednisone / ? Better on 160 vs 80  rec Change symbicort to 160 Take 2 puffs first thing in am and then another 2 puffs about 12 hours later.  If not better Prednisone 10 mg take  4 each am x 2 days,   2 each am x 2 days,  1 each am x 2 days and stop  Please remember to go to the  x-ray department downstairs for your tests - we will call you with the results when they are available.    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 Follow-up    Pt c/o increased cough for the past wk, started while he was [place] the beach. He is coughing up brown sputum.    zpak should turn the mucus back to white - if not call me  No change on the symbicort dose = Take 2 puffs first thing in am and then another 2 puffs about 12 hours later.  For cough > mucinex dm up to 1200  mg every 12 hours  Finished pred end of Nov 2017 finished    12/02/2016 NP Acute OV  Pt presents for an acute office visit. Complains of 5 days of cough , congestion, drainage and wheezing . Coughing but hard to get mucus up . Not using otc meds.    Remains on Symbicort 160  2 bid Prednisone taper over next week.  Zpack take as directed,- use the one you have at home.  Mucinex Twice daily  As needed  As needed  Cough/congestion  Begin Delsym 2 tsp Twice daily  For cough.  Add Zyrtec '10mg'$  At bedtime  As needed  Drainage .  Hydromet 1 tsp every 6hr as needed for cough , may make you sleepy    12/05/2016 acute extended ov/Clarence Dawson re: asthma flare on symb 160 but poor hfa  Chief Complaint  Patient presents with  . Acute Visit    Pt states having increased SOB for the past 2 days. His chest feels tight and he coughs when he lies down. Cough is non prod.     Onset was acute and pattern is progressively worse despite using saba up to every 4 hours   hfa ( out of neb) with  assoc chest tightness/ hacking cough and wheeze.    No obvious day to day or daytime variability or assoc excess/ purulent sputum or mucus plugs or hemoptysis or cp   overt sinus or hb symptoms. No unusual exp hx or h/o childhood pna/ asthma or knowledge of premature birth.  Sleeping ok without nocturnal  or early am exacerbation  of respiratory  c/o's or need for noct saba. Also denies any obvious fluctuation of symptoms with weather or environmental changes or other aggravating or alleviating factors except as outlined above   Current Medications, Allergies, Complete Past Medical History, Past Surgical History, Family History, and Social History were reviewed in [employer] record.  ROS  The following are not active complaints unless bolded sore throat, dysphagia, dental problems, itching, sneezing,  nasal congestion or excess/ purulent secretions, ear ache,   fever, chills, sweats, unintended wt loss,  classically pleuritic or exertional cp,  orthopnea pnd or leg swelling, presyncope, palpitations, abdominal pain, anorexia, nausea, vomiting, diarrhea  or change in bowel or bladder habits, change in stools  or urine, dysuria,hematuria,  rash, arthralgias, visual complaints, headache, numbness, weakness or ataxia or problems with walking or coordination,  change in mood/affect or memory.                        Objective:   Physical Exam  Obese   amb wm nad p saba w/in 1 h of ov    02/12/2016        241 > 03/11/2016 243 > 06/23/2016  241 > 09/23/2016  241 > 12/05/2016  247      Vital signs reviewed - Note on arrival 02 sats  96% on RA       HEENT: nl dentition, turbinates, and oropharynx. Nl external ear canals without cough reflex   NECK :  without JVD/Nodes/TM/ nl carotid upstrokes bilaterally   LUNGS: no acc muscle use,  Nl contour chest with early bilateral Exp wheezing    CV:  RRR  no s3 or murmur or increase in P2, no edema   ABD:  soft and nontender with nl inspiratory excursion in the supine position. No bruits or organomegaly, bowel sounds nl  MS:  Nl gait/ ext warm without deformities, calf tenderness, cyanosis or clubbing No obvious joint restrictions   SKIN: warm and dry without lesions    NEURO:  alert, approp, nl sensorium with  no motor deficits      CXR PA and Lateral:   12/05/2016 :    I personally reviewed images and agree with radiology impression as follows:    Lingular/left basilar scarring.  No active disease.          Assessment & Plan:

## 2016-12-05 NOTE — Patient Instructions (Addendum)
zpak called in  Stop lopressor and start bisoprolol 5 mg twice daily in its place Stop delsym and for cough take mucinex dm up to 1200 mg every 12 hours   Prednisone 20 mg daily until better then 10 mg daily x 3 days and 5 mg x 3 days and stop    Plan A = Automatic = Symbicort 160 Take 2 puffs first thing in am and then another 2 puffs about 12 hours later.   Plan B = Backup Only use your albuterol as a rescue medication to be used if you can't catch your breath by resting or doing a relaxed purse lip breathing pattern.  - The less you use it, the better it will work when you need it. - Ok to use the inhaler up to 2 puffs  every 4 hours if you must but call for appointment if use goes up over your usual need - Don't leave home without it !!  (think of it like the spare tire for your car)   Plan C = Crisis - only use your albuterol nebulizer if you first try Plan B and it fails to help > ok to use the nebulizer up to every 4 hours but if start needing it regularly call for immediate appointment  Work on inhaler technique:  relax and gently blow all the way out then take a nice smooth deep breath back in, triggering the inhaler at same time you start breathing in.  Hold for up to 5 seconds if you can. Blow out thru nose. Rinse and gargle with water when done      Please remember to go to the  x-ray department downstairs for your tests - we will call you with the results when they are available.  Keep previous appointment

## 2016-12-05 NOTE — Telephone Encounter (Signed)
Pt returning call.Clarence Dawson ° °

## 2016-12-05 NOTE — Progress Notes (Signed)
Spoke with pt and notified of results per Dr. Wert. Pt verbalized understanding and denied any questions. 

## 2016-12-08 ENCOUNTER — Encounter: Payer: Self-pay | Admitting: Internal Medicine

## 2016-12-08 DIAGNOSIS — J45901 Unspecified asthma with (acute) exacerbation: Secondary | ICD-10-CM | POA: Insufficient documentation

## 2016-12-08 DIAGNOSIS — J441 Chronic obstructive pulmonary disease with (acute) exacerbation: Secondary | ICD-10-CM | POA: Insufficient documentation

## 2016-12-08 NOTE — Assessment & Plan Note (Signed)
Strongly prefer in this setting: Bystolic, the most beta -1  selective Beta blocker available in sample form, with bisoprolol the most selective generic choice  on the market.   Try bisoprolol 5 mg bid

## 2016-12-08 NOTE — Assessment & Plan Note (Signed)
Body mass index is 36.48 trending up  No results found for: TSH   Contributing to gerd tendency/ doe/reviewed the need and the process to achieve and maintain neg calorie balance > defer f/u primary care including intermittently monitoring thyroid status

## 2016-12-08 NOTE — Assessment & Plan Note (Signed)
02/12/2016    try symbicort 160 2bid instead of advair  - 03/11/2016   try symbicort 80 2bid  - 06/23/2016 flared on symbicort 80 with FENO  = 75 rec 160 2bid - 09/23/2016  After extensive coaching HFA effectiveness =    90%  - FENO 09/23/2016  =  32   On symbicort 160 2bid > no change rx    - The proper method of use, as well as anticipated side effects, of a metered-dose inhaler are discussed and demonstrated to the patient. Improved effectiveness after extensive coaching during this visit to a level of approximately 75 % from a baseline of 50 % > continue hfa for now/ supplement with neb   DDX of  difficult airways management almost all start with A and  include Adherence, Ace Inhibitors, Acid Reflux, Active Sinus Disease, Alpha 1 Antitripsin deficiency, Anxiety masquerading as Airways dz,  ABPA,  Allergy(esp in young), Aspiration (esp in elderly), Adverse effects of meds,  Active smokers, A bunch of PE's (a small clot burden can't cause this syndrome unless there is already severe underlying pulm or vascular dz with poor reserve) plus two Bs  = Bronchiectasis and Beta blocker use..and one C= CHF  Adherence is always the initial "prime suspect" and is a multilayered concern that requires a "trust but verify" approach in every patient - starting with knowing how to use medications, especially inhalers, correctly, keeping up with refills and understanding the fundamental difference between maintenance and prns vs those medications only taken for a very short course and then stopped and not refilled.   ? Allergy > . Prednisone 10 mg take  4 each am x 2 days,   2 each am x 2 days,  1 each am x 2 days and stop   ? BB > change BB to bisoprolol (see separate a/p)    I had an extended discussion with the patient reviewing all relevant studies completed to date and  lasting 25 minutes of a 40  minute acute office visit  re  non-specific but potentially very serious pulmonary symptoms of unknown  etiology.  Each maintenance medication was reviewed in detail including most importantly the difference between maintenance and prns and under what circumstances the prns are to be triggered using an action plan format that is not reflected in the computer generated alphabetically organized AVS.    Please see AVS for unique instructions that I personally wrote and verbalized to the the pt in detail and then reviewed with pt  by my nurse highlighting any  changes in therapy recommended at today's visit to their plan of care.

## 2016-12-08 NOTE — Assessment & Plan Note (Signed)
See acute asthma flare - rx cough with mucine dm

## 2016-12-11 ENCOUNTER — Telehealth: Payer: Self-pay | Admitting: Cardiology

## 2016-12-11 NOTE — Telephone Encounter (Signed)
Pt called and stated that he thinks he is out of rhythm. Instructed pt to send a remote transmission and a device tech will review and call him back. Pt verbalized understanding.

## 2016-12-11 NOTE — Telephone Encounter (Signed)
Called pt back and let him know that we did receive his transmission and he is in a-fib. Pt stated that he felt like he had no energy and this was very unlike himself. Informed pt that this would be reviewed with Dr. Rayann Heman and would call him back with recommendations. Pt voiced understanding.

## 2016-12-11 NOTE — Telephone Encounter (Signed)
Called pt back and let him know that Dr. Rayann Heman recommends he see Roderic Palau in the [facility] tomorrow at 9:30am. Pt agreeable to this apt.  Pt given instruction to clinic and parking code.

## 2016-12-12 ENCOUNTER — Encounter (HOSPITAL_COMMUNITY): Payer: Self-pay | Admitting: Nurse Practitioner

## 2016-12-12 ENCOUNTER — Ambulatory Visit (HOSPITAL_COMMUNITY)
Admission: RE | Admit: 2016-12-12 | Discharge: 2016-12-12 | Disposition: A | Discharge status: Home / Self Care | Payer: Medicare Other | Source: Ambulatory Visit | Attending: Nurse Practitioner | Admitting: Nurse Practitioner

## 2016-12-12 VITALS — BP 126/78 | HR 95 | Wt 243.6 lb

## 2016-12-12 DIAGNOSIS — G4733 Obstructive sleep apnea (adult) (pediatric): Secondary | ICD-10-CM | POA: Insufficient documentation

## 2016-12-12 DIAGNOSIS — Z8249 Family history of ischemic heart disease and other diseases of the circulatory system: Secondary | ICD-10-CM | POA: Insufficient documentation

## 2016-12-12 DIAGNOSIS — E039 Hypothyroidism, unspecified: Secondary | ICD-10-CM | POA: Diagnosis not present

## 2016-12-12 DIAGNOSIS — I4510 Unspecified right bundle-branch block: Secondary | ICD-10-CM | POA: Diagnosis not present

## 2016-12-12 DIAGNOSIS — Z950 Presence of cardiac pacemaker: Secondary | ICD-10-CM | POA: Diagnosis not present

## 2016-12-12 DIAGNOSIS — I480 Paroxysmal atrial fibrillation: Secondary | ICD-10-CM | POA: Diagnosis not present

## 2016-12-12 DIAGNOSIS — Z800 Family history of malignant neoplasm of digestive organs: Secondary | ICD-10-CM | POA: Diagnosis not present

## 2016-12-12 DIAGNOSIS — J45909 Unspecified asthma, uncomplicated: Secondary | ICD-10-CM | POA: Insufficient documentation

## 2016-12-12 DIAGNOSIS — K219 Gastro-esophageal reflux disease without esophagitis: Secondary | ICD-10-CM | POA: Diagnosis not present

## 2016-12-12 DIAGNOSIS — Z9049 Acquired absence of other specified parts of digestive tract: Secondary | ICD-10-CM | POA: Insufficient documentation

## 2016-12-12 DIAGNOSIS — Z91018 Allergy to other foods: Secondary | ICD-10-CM | POA: Diagnosis not present

## 2016-12-12 DIAGNOSIS — E785 Hyperlipidemia, unspecified: Secondary | ICD-10-CM | POA: Insufficient documentation

## 2016-12-12 DIAGNOSIS — M1990 Unspecified osteoarthritis, unspecified site: Secondary | ICD-10-CM | POA: Insufficient documentation

## 2016-12-12 DIAGNOSIS — I10 Essential (primary) hypertension: Secondary | ICD-10-CM | POA: Insufficient documentation

## 2016-12-12 DIAGNOSIS — Z888 Allergy status to other drugs, medicaments and biological substances status: Secondary | ICD-10-CM | POA: Insufficient documentation

## 2016-12-12 DIAGNOSIS — Z7901 Long term (current) use of anticoagulants: Secondary | ICD-10-CM | POA: Diagnosis not present

## 2016-12-12 DIAGNOSIS — Z91041 Radiographic dye allergy status: Secondary | ICD-10-CM | POA: Diagnosis not present

## 2016-12-12 DIAGNOSIS — Z91013 Allergy to seafood: Secondary | ICD-10-CM | POA: Diagnosis not present

## 2016-12-12 DIAGNOSIS — Z9889 Other specified postprocedural states: Secondary | ICD-10-CM | POA: Diagnosis not present

## 2016-12-12 DIAGNOSIS — E119 Type 2 diabetes mellitus without complications: Secondary | ICD-10-CM | POA: Insufficient documentation

## 2016-12-12 DIAGNOSIS — I4891 Unspecified atrial fibrillation: Secondary | ICD-10-CM

## 2016-12-12 DIAGNOSIS — Z87891 Personal history of nicotine dependence: Secondary | ICD-10-CM | POA: Insufficient documentation

## 2016-12-12 MED ORDER — BISOPROLOL FUMARATE 5 MG PO TABS: 7.5000 mg | ORAL_TABLET | Freq: Two times a day (BID) | ORAL | 3 refills | Status: DC

## 2016-12-12 NOTE — Progress Notes (Signed)
Primary Care Physician: [name], MD Referring Physician: Dr. 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 male with a h/o PAF, symptomatic brady 2/2 sinus node dysfunction , s/p PPM. He is being seen today in the atrial fibrillation clinic for evaluation. He statred feeling poorly early this week with exertional dyspnea and fatigue. He thought he may be in afib and wanted to be checked. Ekg confirme afib. He went tiont afib in August and was scheduled for cardioversion but converted to SR on his own. He reports that he has had two rounds of prednisone which he still currently taking and metoprolol recently stopped and bisoprolol was started for his breathing issues.   Today, he denies symptoms of palpitations, chest pain, shortness of breath, orthopnea, PND, lower extremity edema, dizziness, presyncope, syncope, or neurologic sequela. The patient is tolerating medications without difficulties and is otherwise without complaint today.   Past Medical History:  Diagnosis Date  . Allergic rhinitis   . Aortic valve disorder   . Asthma    since childhood- seasonal allergies induced  . Carotid artery stenosis   . Essential hypertension   . Full dentures   . GERD (gastroesophageal reflux disease)   . H/O hiatal hernia   . Hemorrhage of rectum   . Hyperlipidemia   . Hypothyroidism   . 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 OSA (obstructive sleep apnea)   . Osteoarthritis   . Pacemaker    Oct 2005 in [place].  Marland Kitchen PAF (paroxysmal atrial fibrillation) ([place])   . RBBB (right bundle branch block)   . Sinoatrial node dysfunction (HCC)   . Syncope   . Tricuspid valve disorder   . Type 2 diabetes mellitus ([place])    Past Surgical History:  Procedure Laterality Date  . A-V CARDIAC PACEMAKER INSERTION     Sick sinus syndrome DDR pacer  . Arthropathy  2005   Rebuilding of left thumb and joint   . CARDIAC CATHETERIZATION    . CARDIAC ELECTROPHYSIOLOGY STUDY AND ABLATION  09/2008   for pvcs, Dr.  Loralie Champagne  . CARPAL TUNNEL RELEASE  1994   right wrist  . CARPAL TUNNEL RELEASE  05/04/2012   Procedure: CARPAL TUNNEL RELEASE;  Surgeon: Wynonia Sours, MD;  Location: [place];  Service: Orthopedics;  Laterality: Left;  . CARPOMETACARPEL SUSPENSION PLASTY Right 11/16/2014   Procedure: SUSPENSIONPLASTY RIGHT THUMB TENDON TRANSFER ABDUCTOR POLLICUS LONGUS EXCISION TRAPEZIUM;  Surgeon: Daryll Brod, MD;  Location: [place];  Service: Orthopedics;  Laterality: Right;  . CHOLECYSTECTOMY  1994  . CIRCUMCISION    . COLONOSCOPY N/A 03/14/2013   Procedure: COLONOSCOPY;  Surgeon: Daneil Dolin, MD;  [facility];  Service: Endoscopy;  Laterality: N/A;  8:15 AM  . EYE SURGERY     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  . EYE SURGERY  2012   Left eye Corneal transplant- partial- Cataract  . GALLBLADDER SURGERY  12/01/2006  . HAND TENDON SURGERY Left late 1990's   thumb  . HEMORROIDECTOMY  2003  . left Knee Arthroscopy     [facility]  . PARTIAL KNEE ARTHROPLASTY  11/22/2012   Procedure: UNICOMPARTMENTAL KNEE;  Surgeon: Lorn Junes, MD;  Location: [place];  Service: Orthopedics;  Laterality: Left;  left unicompartmental knee arthroplasty  . PERMANENT PACEMAKER GENERATOR CHANGE N/A 01/12/2013   Procedure: PERMANENT PACEMAKER GENERATOR CHANGE;  Surgeon: Evans Lance, MD;  Location: [facility] CATH LAB;  Service: Cardiovascular;  Laterality: N/A;  . Rotator cuff Surgery  2001   Right shoulder  . TONSILLECTOMY      Current Outpatient Prescriptions  Medication Sig Dispense Refill  . acetaminophen (TYLENOL) 500 MG tablet Take 500 mg by mouth every 6 (six) hours as needed. Pt said he takes 2 500 mg tablets  Every 6-8 hours ( averages 1 gm  Tid) and was told he could do so    . albuterol (ACCUNEB) 0.63 MG/3ML nebulizer solution Take 3 mLs (0.63 mg total) by nebulization every 4 (four) hours as needed for wheezing. 75 mL 1  . amLODipine  (NORVASC) 10 MG tablet Take 10 mg by mouth daily.      . bisoprolol (ZEBETA) 5 MG tablet Take 1.5 tablets (7.5 mg total) by mouth 2 (two) times daily. One twice daily 90 tablet 3  . budesonide-formoterol (SYMBICORT) 160-4.5 MCG/ACT inhaler Take 2 puffs first thing in am and then another 2 puffs about 12 hours later. 1 Inhaler 11  . canagliflozin (INVOKANA) 300 MG TABS tablet Take 300 mg by mouth daily before breakfast.    . cloNIDine (CATAPRES) 0.1 MG tablet Take 0.1 mg by mouth 2 (two) times daily.    Marland Kitchen glipiZIDE (GLUCOTROL XL) 2.5 MG 24 hr tablet Take 5 mg by mouth daily with breakfast.     . HYDROcodone-homatropine (HYDROMET) 5-1.5 MG/5ML syrup Take 5 mLs by mouth every 6 (six) hours as needed. 240 mL 0  . levothyroxine (SYNTHROID, LEVOTHROID) 150 MCG tablet Take 1 tablet by mouth daily.    Marland Kitchen losartan-hydrochlorothiazide (HYZAAR) 100-25 MG per tablet Take 1 tablet by mouth daily.    . Multiple Vitamin (MULTIVITAMIN) tablet Take 1 tablet by mouth daily.      . predniSONE (DELTASONE) 10 MG tablet 4 tabs for 2 days, then 3 tabs for 2 days, 2 tabs for 2 days, then 1 tab for 2 days, then stop 20 tablet 0  . simvastatin (ZOCOR) 20 MG tablet Take 20 mg by mouth at bedtime.    Alveda Reasons 20 MG TABS tablet TAKE 1 TABLET BY MOUTH DAILY WITH SUPPER 90 tablet 1   No current facility-administered medications for this encounter.     Allergies  Allergen Reactions  . Food Anaphylaxis and Shortness Of Breath    Tree nuts- breathing difficulty, seafood- breathing difficulty  . Iodinated Diagnostic Agents Hives and Shortness Of Breath    Patient states hives to throat closing.   . Iodine Itching and Other (See Comments)    Reaction unknown  . Clavulanic Acid Diarrhea  . Shellfish Allergy     unknown  . Voltaren [Diclofenac Sodium] Other (See Comments)    Feels like things are crawling on him    Social History   Social History  . Marital status: Widowed    Spouse name: N/A  . Number of children:  N/A  . Years of education: N/A   Occupational History  . Not on file.   Social History Main Topics  . Smoking status: Former Smoker    Packs/day: 1.00    Years: 25.00    Types: Cigarettes    Start date: 02/20/1958    Quit date: 02/12/1984  . Smokeless tobacco: Never Used  . Alcohol use 1.2 oz/week    1 Glasses of wine, 1 Cans of beer per week     Comment: 1 can of beer or glasse of wine daily  . Drug use: No  . Sexual activity: Not on file   Other Topics  Concern  . Not on file   Social History Narrative   Regular exercise: No    Family History  Problem Relation Age of Onset  . Other Father 22    Sudden Cardiac death  . Pancreatic cancer Mother   . Colon cancer Mother   . Colon cancer Maternal Aunt   . Colon polyps Neg Hx     ROS- All systems are reviewed and negative except as per the HPI above  Physical Exam: Vitals:   12/12/16 0939  BP: 126/78  Pulse: 95  Weight: 243 lb 9.6 oz (110.5 kg)   Wt Readings from Last 3 Encounters:  12/12/16 243 lb 9.6 oz (110.5 kg)  12/05/16 247 lb (112 kg)  12/02/16 242 lb 3.2 oz (109.9 kg)    Labs: Lab Results  Component Value Date   NA 140 10/20/2016   K 3.9 10/20/2016   CL 103 10/20/2016   CO2 30 10/20/2016   GLUCOSE 144 (H) 10/20/2016   BUN 20 10/20/2016   CREATININE 1.00 10/20/2016   CALCIUM 9.7 10/20/2016   Lab Results  Component Value Date   INR 0.91 01/10/2013   No results found for: CHOL, HDL, LDLCALC, TRIG   GEN- The patient is well appearing, alert and oriented x 3 today.   Head- normocephalic, atraumatic Eyes-  Sclera clear, conjunctiva pink Ears- hearing intact Oropharynx- clear Neck- supple, no JVP Lymph- no cervical lymphadenopathy Lungs- Clear to ausculation bilaterally, normal work of breathing Heart- irregular rate and rhythm, no murmurs, rubs or gallops, PMI not laterally displaced GI- soft, NT, ND, + BS Extremities- no clubbing, cyanosis, or edema MS- no significant deformity or  atrophy Skin- no rash or lesion Psych- euthymic mood, full affect Neuro- strength and sensation are intact  EKG- Afib at 95 bpm RBBB Epic records reviewed    Assessment and Plan: 1. Paroxysmal afib Probably precipitated by decrease of BB and steroid use Increase bisoprolol to 7.5 mg bid Pt will be finishing steroid taper over the next few days  Will see back next week and if still in afib will set up for cardioversion NO missed doses of xarelto x 3 weeks  F/u next Wednesday, sooner if needed   Butch Penny C. Britian Jentz, [facility] [address] [place], [place] [address] [phone]

## 2016-12-12 NOTE — Patient Instructions (Signed)
Your physician has recommended you make the following change in your medication:  1)Increase bystolic to 7.'5mg'$  twice a day  Decrease caffeine intake

## 2016-12-17 ENCOUNTER — Encounter (HOSPITAL_COMMUNITY): Payer: Self-pay | Admitting: Nurse Practitioner

## 2016-12-17 ENCOUNTER — Ambulatory Visit (HOSPITAL_COMMUNITY)
Admission: RE | Admit: 2016-12-17 | Discharge: 2016-12-17 | Disposition: A | Discharge status: Home / Self Care | Payer: Medicare Other | Source: Ambulatory Visit | Attending: Nurse Practitioner | Admitting: Nurse Practitioner

## 2016-12-17 ENCOUNTER — Other Ambulatory Visit (HOSPITAL_COMMUNITY): Payer: Self-pay | Admitting: *Deleted

## 2016-12-17 VITALS — BP 104/68 | HR 84 | Ht 69.0 in | Wt 243.6 lb

## 2016-12-17 DIAGNOSIS — Z888 Allergy status to other drugs, medicaments and biological substances status: Secondary | ICD-10-CM | POA: Diagnosis not present

## 2016-12-17 DIAGNOSIS — I480 Paroxysmal atrial fibrillation: Secondary | ICD-10-CM | POA: Insufficient documentation

## 2016-12-17 DIAGNOSIS — I10 Essential (primary) hypertension: Secondary | ICD-10-CM | POA: Diagnosis not present

## 2016-12-17 DIAGNOSIS — M1990 Unspecified osteoarthritis, unspecified site: Secondary | ICD-10-CM | POA: Diagnosis not present

## 2016-12-17 DIAGNOSIS — E785 Hyperlipidemia, unspecified: Secondary | ICD-10-CM | POA: Diagnosis not present

## 2016-12-17 DIAGNOSIS — G4733 Obstructive sleep apnea (adult) (pediatric): Secondary | ICD-10-CM | POA: Insufficient documentation

## 2016-12-17 DIAGNOSIS — I495 Sick sinus syndrome: Secondary | ICD-10-CM | POA: Insufficient documentation

## 2016-12-17 DIAGNOSIS — Z91013 Allergy to seafood: Secondary | ICD-10-CM | POA: Diagnosis not present

## 2016-12-17 DIAGNOSIS — Z7951 Long term (current) use of inhaled steroids: Secondary | ICD-10-CM | POA: Diagnosis not present

## 2016-12-17 DIAGNOSIS — I481 Persistent atrial fibrillation: Secondary | ICD-10-CM

## 2016-12-17 DIAGNOSIS — K219 Gastro-esophageal reflux disease without esophagitis: Secondary | ICD-10-CM | POA: Insufficient documentation

## 2016-12-17 DIAGNOSIS — Z7984 Long term (current) use of oral hypoglycemic drugs: Secondary | ICD-10-CM | POA: Diagnosis not present

## 2016-12-17 DIAGNOSIS — Z950 Presence of cardiac pacemaker: Secondary | ICD-10-CM | POA: Diagnosis not present

## 2016-12-17 DIAGNOSIS — Z79899 Other long term (current) drug therapy: Secondary | ICD-10-CM | POA: Insufficient documentation

## 2016-12-17 DIAGNOSIS — Z87891 Personal history of nicotine dependence: Secondary | ICD-10-CM | POA: Diagnosis not present

## 2016-12-17 DIAGNOSIS — Z7901 Long term (current) use of anticoagulants: Secondary | ICD-10-CM | POA: Insufficient documentation

## 2016-12-17 DIAGNOSIS — I4819 Other persistent atrial fibrillation: Secondary | ICD-10-CM

## 2016-12-17 DIAGNOSIS — Z91041 Radiographic dye allergy status: Secondary | ICD-10-CM | POA: Insufficient documentation

## 2016-12-17 DIAGNOSIS — E119 Type 2 diabetes mellitus without complications: Secondary | ICD-10-CM | POA: Insufficient documentation

## 2016-12-17 DIAGNOSIS — J45909 Unspecified asthma, uncomplicated: Secondary | ICD-10-CM | POA: Insufficient documentation

## 2016-12-17 DIAGNOSIS — E039 Hypothyroidism, unspecified: Secondary | ICD-10-CM | POA: Diagnosis not present

## 2016-12-17 DIAGNOSIS — Z800 Family history of malignant neoplasm of digestive organs: Secondary | ICD-10-CM | POA: Insufficient documentation

## 2016-12-17 LAB — CBC
HCT: 49.6 % (ref 39.0–52.0)
Hemoglobin: 16.9 g/dL (ref 13.0–17.0)
MCH: 31.6 pg (ref 26.0–34.0)
MCHC: 34.1 g/dL (ref 30.0–36.0)
MCV: 92.7 fL (ref 78.0–100.0)
PLATELETS: 169 10*3/uL (ref 150–400)
RBC: 5.35 MIL/uL (ref 4.22–5.81)
RDW: 13.1 % (ref 11.5–15.5)
WBC: 6.7 10*3/uL (ref 4.0–10.5)

## 2016-12-17 LAB — BASIC METABOLIC PANEL
Anion gap: 6 (ref 5–15)
BUN: 21 mg/dL — AB (ref 6–20)
CO2: 31 mmol/L (ref 22–32)
CREATININE: 1.3 mg/dL — AB (ref 0.61–1.24)
Calcium: 9.4 mg/dL (ref 8.9–10.3)
Chloride: 102 mmol/L (ref 101–111)
GFR calc Af Amer: >60 mL/min (ref >60)
GFR, EST NON AFRICAN AMERICAN: 54 mL/min — AB (ref >60)
GLUCOSE: 289 mg/dL — AB (ref 65–99)
POTASSIUM: 3.9 mmol/L (ref 3.5–5.1)
SODIUM: 139 mmol/L (ref 135–145)

## 2016-12-17 NOTE — Progress Notes (Signed)
Primary Care Physician: [name], MD Referring Physician: Dr. 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 male with a h/o PAF, symptomatic brady 2/2 sinus node dysfunction , s/p PPM. He is being seen today in the atrial fibrillation clinic for evaluation. He statred feeling poorly early this week with exertional dyspnea and fatigue. He thought he may be in afib and wanted to be checked. Ekg confirme afib. He went into afib in August and was scheduled for cardioversion but converted to SR on his own. He reports that he has had two rounds of prednisone which he still currently taking and metoprolol recently stopped and bisoprolol was started for his breathing issues.   He returns today after tapering off prednisone and increasing bisoprolol and continues in afib with cvr.. He feels very fatigued and short of breath with activity. He is wishing to go ahead with cardioversion. He has not missed any doses of xarelto.  Today, he denies symptoms of palpitations, chest pain, shortness of breath, orthopnea, PND, lower extremity edema, dizziness, presyncope, syncope, or neurologic sequela. The patient is tolerating medications without difficulties and is otherwise without complaint today.   Past Medical History:  Diagnosis Date  . Allergic rhinitis   . Aortic valve disorder   . Asthma    since childhood- seasonal allergies induced  . Carotid artery stenosis   . Essential hypertension   . Full dentures   . GERD (gastroesophageal reflux disease)   . H/O hiatal hernia   . Hemorrhage of rectum   . Hyperlipidemia   . Hypothyroidism   . 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 OSA (obstructive sleep apnea)   . Osteoarthritis   . Pacemaker    Oct 2005 in [place].  Marland Kitchen PAF (paroxysmal atrial fibrillation) ([place])   . RBBB (right bundle branch block)   . Sinoatrial node dysfunction (HCC)   . Syncope   . Tricuspid valve disorder   . Type 2 diabetes mellitus ([place])    Past Surgical History:  Procedure  Laterality Date  . A-V CARDIAC PACEMAKER INSERTION     Sick sinus syndrome DDR pacer  . Arthropathy  2005   Rebuilding of left thumb and joint   . CARDIAC CATHETERIZATION    . CARDIAC ELECTROPHYSIOLOGY STUDY AND ABLATION  09/2008   for pvcs, Dr. Loralie Champagne  . CARPAL TUNNEL RELEASE  1994   right wrist  . CARPAL TUNNEL RELEASE  05/04/2012   Procedure: CARPAL TUNNEL RELEASE;  Surgeon: Wynonia Sours, MD;  Location: [place];  Service: Orthopedics;  Laterality: Left;  . CARPOMETACARPEL SUSPENSION PLASTY Right 11/16/2014   Procedure: SUSPENSIONPLASTY RIGHT THUMB TENDON TRANSFER ABDUCTOR POLLICUS LONGUS EXCISION TRAPEZIUM;  Surgeon: Daryll Brod, MD;  Location: [place];  Service: Orthopedics;  Laterality: Right;  . CHOLECYSTECTOMY  1994  . CIRCUMCISION    . COLONOSCOPY N/A 03/14/2013   Procedure: COLONOSCOPY;  Surgeon: Daneil Dolin, MD;  [facility];  Service: Endoscopy;  Laterality: N/A;  8:15 AM  . EYE SURGERY     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-[facility]  . EYE SURGERY  2012   Left eye Corneal transplant- partial- Cataract  . GALLBLADDER SURGERY  12/01/2006  . HAND TENDON SURGERY Left late 1990's   thumb  . HEMORROIDECTOMY  2003  . left Knee Arthroscopy     [facility]- [facility]  . PARTIAL KNEE ARTHROPLASTY  11/22/2012   Procedure: UNICOMPARTMENTAL KNEE;  Surgeon: Lorn Junes, MD;  Location: [place];  Service: Orthopedics;  Laterality: Left;  left unicompartmental knee arthroplasty  . PERMANENT PACEMAKER GENERATOR CHANGE N/A 01/12/2013   Procedure: PERMANENT PACEMAKER GENERATOR CHANGE;  Surgeon: Evans Lance, MD;  Location: [facility] CATH LAB;  Service: Cardiovascular;  Laterality: N/A;  . Rotator cuff Surgery  2001   Right shoulder  . TONSILLECTOMY      Current Outpatient Prescriptions  Medication Sig Dispense Refill  . acetaminophen (TYLENOL) 500 MG tablet Take 500 mg by mouth every 6 (six) hours as needed. Pt said he takes 2 500  mg tablets  Every 6-8 hours ( averages 1 gm  Tid) and was told he could do so    . albuterol (ACCUNEB) 0.63 MG/3ML nebulizer solution Take 3 mLs (0.63 mg total) by nebulization every 4 (four) hours as needed for wheezing. 75 mL 1  . amLODipine (NORVASC) 10 MG tablet Take 10 mg by mouth daily.      . bisoprolol (ZEBETA) 5 MG tablet Take 1.5 tablets (7.5 mg total) by mouth 2 (two) times daily. One twice daily 90 tablet 3  . budesonide-formoterol (SYMBICORT) 160-4.5 MCG/ACT inhaler Take 2 puffs first thing in am and then another 2 puffs about 12 hours later. 1 Inhaler 11  . canagliflozin (INVOKANA) 300 MG TABS tablet Take 300 mg by mouth daily before breakfast.    . cloNIDine (CATAPRES) 0.1 MG tablet Take 0.1 mg by mouth 2 (two) times daily.    Marland Kitchen glipiZIDE (GLUCOTROL XL) 2.5 MG 24 hr tablet Take 5 mg by mouth daily with breakfast.     . HYDROcodone-homatropine (HYDROMET) 5-1.5 MG/5ML syrup Take 5 mLs by mouth every 6 (six) hours as needed. 240 mL 0  . levothyroxine (SYNTHROID, LEVOTHROID) 150 MCG tablet Take 1 tablet by mouth daily.    Marland Kitchen losartan-hydrochlorothiazide (HYZAAR) 100-25 MG per tablet Take 1 tablet by mouth daily.    . Multiple Vitamin (MULTIVITAMIN) tablet Take 1 tablet by mouth daily.      . simvastatin (ZOCOR) 20 MG tablet Take 20 mg by mouth at bedtime.    Alveda Reasons 20 MG TABS tablet TAKE 1 TABLET BY MOUTH DAILY WITH SUPPER 90 tablet 1   No current facility-administered medications for this encounter.     Allergies  Allergen Reactions  . Food Anaphylaxis and Shortness Of Breath    Tree nuts- breathing difficulty, seafood- breathing difficulty  . Iodinated Diagnostic Agents Hives and Shortness Of Breath    Patient states hives to throat closing.   . Iodine Itching and Other (See Comments)    Reaction unknown  . Clavulanic Acid Diarrhea  . Shellfish Allergy     unknown  . Voltaren [Diclofenac Sodium] Other (See Comments)    Feels like things are crawling on him    Social  History   Social History  . Marital status: Widowed    Spouse name: N/A  . Number of children: N/A  . Years of education: N/A   Occupational History  . Not on file.   Social History Main Topics  . Smoking status: Former Smoker    Packs/day: 1.00    Years: 25.00    Types: Cigarettes    Start date: 02/20/1958    Quit date: 02/12/1984  . Smokeless tobacco: Never Used  . Alcohol use 1.2 oz/week    1 Glasses of wine, 1 Cans of beer per week     Comment: 1 can of beer or glasse of wine daily  . Drug use: No  .  Sexual activity: Not on file   Other Topics Concern  . Not on file   Social History Narrative   Regular exercise: No    Family History  Problem Relation Age of Onset  . Other Father 65    Sudden Cardiac death  . Pancreatic cancer Mother   . Colon cancer Mother   . Colon cancer Maternal Aunt   . Colon polyps Neg Hx     ROS- All systems are reviewed and negative except as per the HPI above  Physical Exam: Vitals:   12/17/16 0936  BP: 104/68  Pulse: 84  Weight: 243 lb 9.6 oz (110.5 kg)  Height: '5\' 9"'$  (1.753 m)   Wt Readings from Last 3 Encounters:  12/17/16 243 lb 9.6 oz (110.5 kg)  12/12/16 243 lb 9.6 oz (110.5 kg)  12/05/16 247 lb (112 kg)    Labs: Lab Results  Component Value Date   NA 139 12/17/2016   K 3.9 12/17/2016   CL 102 12/17/2016   CO2 31 12/17/2016   GLUCOSE 289 (H) 12/17/2016   BUN 21 (H) 12/17/2016   CREATININE 1.30 (H) 12/17/2016   CALCIUM 9.4 12/17/2016   Lab Results  Component Value Date   INR 0.91 01/10/2013   No results found for: CHOL, HDL, LDLCALC, TRIG   GEN- The patient is well appearing, alert and oriented x 3 today.   Head- normocephalic, atraumatic Eyes-  Sclera clear, conjunctiva pink Ears- hearing intact Oropharynx- clear Neck- supple, no JVP Lymph- no cervical lymphadenopathy Lungs- Clear to ausculation bilaterally, normal work of breathing Heart- irregular rate and rhythm, no murmurs, rubs or gallops, PMI  not laterally displaced GI- soft, NT, ND, + BS Extremities- no clubbing, cyanosis, or edema MS- no significant deformity or atrophy Skin- no rash or lesion Psych- euthymic mood, full affect Neuro- strength and sensation are intact  EKG- Afib at 84 bpm RBBB with frequent paced ventricular complexes. Epic records reviewed    Assessment and Plan: 1. Paroxysmal afib Probably precipitated by decrease of BB and steroid use Increase bisoprolol to 7.5 mg bid Pt has finished steroid taper as of yesterday Will  set up for cardioversion first available States NO missed doses of xarelto   F/u in afib clinic one week after cardioversion  Butch Penny C. Schawn Byas, [facility] [address] [place], [place] [address] [phone]

## 2016-12-17 NOTE — Patient Instructions (Signed)
Cardioversion scheduled for Friday, December 22nd  - Arrive at the [facility] and go to admitting at 9:30AM  -Do not eat or drink anything after midnight the night prior to your procedure.  - Take all your medication with a sip of water prior to arrival.  - You will not be able to drive home after your procedure.

## 2016-12-18 ENCOUNTER — Encounter (HOSPITAL_COMMUNITY): Payer: Self-pay

## 2016-12-18 ENCOUNTER — Encounter (HOSPITAL_COMMUNITY)
Admission: RE | Disposition: A | Discharge status: Home / Self Care | Payer: Self-pay | Source: Ambulatory Visit | Attending: Cardiology

## 2016-12-18 ENCOUNTER — Other Ambulatory Visit (HOSPITAL_COMMUNITY): Payer: Self-pay | Admitting: *Deleted

## 2016-12-18 ENCOUNTER — Ambulatory Visit (HOSPITAL_COMMUNITY): Payer: Medicare Other | Admitting: Certified Registered Nurse Anesthetist

## 2016-12-18 ENCOUNTER — Ambulatory Visit (HOSPITAL_COMMUNITY)
Admission: RE | Admit: 2016-12-18 | Discharge: 2016-12-18 | Disposition: A | Discharge status: Home / Self Care | Payer: Medicare Other | Source: Ambulatory Visit | Attending: Cardiology | Admitting: Cardiology

## 2016-12-18 DIAGNOSIS — Z7984 Long term (current) use of oral hypoglycemic drugs: Secondary | ICD-10-CM | POA: Diagnosis not present

## 2016-12-18 DIAGNOSIS — G4733 Obstructive sleep apnea (adult) (pediatric): Secondary | ICD-10-CM | POA: Insufficient documentation

## 2016-12-18 DIAGNOSIS — Z950 Presence of cardiac pacemaker: Secondary | ICD-10-CM | POA: Diagnosis not present

## 2016-12-18 DIAGNOSIS — I481 Persistent atrial fibrillation: Secondary | ICD-10-CM | POA: Diagnosis not present

## 2016-12-18 DIAGNOSIS — Z87891 Personal history of nicotine dependence: Secondary | ICD-10-CM | POA: Insufficient documentation

## 2016-12-18 DIAGNOSIS — I10 Essential (primary) hypertension: Secondary | ICD-10-CM | POA: Diagnosis not present

## 2016-12-18 DIAGNOSIS — E119 Type 2 diabetes mellitus without complications: Secondary | ICD-10-CM | POA: Diagnosis not present

## 2016-12-18 DIAGNOSIS — I739 Peripheral vascular disease, unspecified: Secondary | ICD-10-CM | POA: Diagnosis not present

## 2016-12-18 DIAGNOSIS — Z79899 Other long term (current) drug therapy: Secondary | ICD-10-CM | POA: Diagnosis not present

## 2016-12-18 DIAGNOSIS — E039 Hypothyroidism, unspecified: Secondary | ICD-10-CM | POA: Diagnosis not present

## 2016-12-18 DIAGNOSIS — K219 Gastro-esophageal reflux disease without esophagitis: Secondary | ICD-10-CM | POA: Diagnosis not present

## 2016-12-18 DIAGNOSIS — Z96652 Presence of left artificial knee joint: Secondary | ICD-10-CM | POA: Insufficient documentation

## 2016-12-18 DIAGNOSIS — Z7901 Long term (current) use of anticoagulants: Secondary | ICD-10-CM | POA: Insufficient documentation

## 2016-12-18 DIAGNOSIS — I4891 Unspecified atrial fibrillation: Secondary | ICD-10-CM | POA: Diagnosis not present

## 2016-12-18 DIAGNOSIS — J45909 Unspecified asthma, uncomplicated: Secondary | ICD-10-CM | POA: Diagnosis not present

## 2016-12-18 DIAGNOSIS — I480 Paroxysmal atrial fibrillation: Secondary | ICD-10-CM | POA: Diagnosis not present

## 2016-12-18 DIAGNOSIS — E785 Hyperlipidemia, unspecified: Secondary | ICD-10-CM | POA: Diagnosis not present

## 2016-12-18 HISTORY — PX: CARDIOVERSION: SHX1299

## 2016-12-18 LAB — GLUCOSE, CAPILLARY: Glucose-Capillary: 186 mg/dL — ABNORMAL HIGH (ref 65–99)

## 2016-12-18 SURGERY — CARDIOVERSION
Anesthesia: General

## 2016-12-18 MED ORDER — SODIUM CHLORIDE 0.9 % IV SOLN
INTRAVENOUS | Status: DC | PRN
  Administered 2016-12-18: 11:00:00 via INTRAVENOUS

## 2016-12-18 MED ORDER — PROPOFOL 10 MG/ML IV BOLUS
INTRAVENOUS | Status: DC | PRN
  Administered 2016-12-18: 20 mg via INTRAVENOUS
  Administered 2016-12-18: 40 mg via INTRAVENOUS

## 2016-12-18 NOTE — Anesthesia Postprocedure Evaluation (Signed)
Anesthesia Post Note  Patient: Clarence Dawson  Procedure(s) Performed: Procedure(s) (LRB): CARDIOVERSION (N/A)  Patient location during evaluation: Endoscopy Anesthesia Type: General Level of consciousness: awake and alert Pain management: pain level controlled Vital Signs Assessment: post-procedure vital signs reviewed and stable Respiratory status: spontaneous breathing, nonlabored ventilation and respiratory function stable Cardiovascular status: blood pressure returned to baseline and stable Postop Assessment: no signs of nausea or vomiting Anesthetic complications: no       Last Vitals:  Vitals:   12/18/16 1140 12/18/16 1145  BP: (!) 113/59 106/71  Pulse: 63 60  Resp: 17 19  Temp:      Last Pain:  Vitals:   12/18/16 1018  TempSrc: Oral                 Braniyah Besse

## 2016-12-18 NOTE — Discharge Instructions (Signed)
Electrical Cardioversion, Care After °This sheet gives you information about how to care for yourself after your procedure. Your health care provider may also give you more specific instructions. If you have problems or questions, contact your health care provider. °What can I expect after the procedure? °After the procedure, it is common to have: °· Some redness on the skin where the shocks were given. °Follow these instructions at home: °· Do not drive for 24 hours if you were given a medicine to help you relax (sedative). °· Take over-the-counter and prescription medicines only as told by your health care provider. °· Ask your health care provider how to check your pulse. Check it often. °· Rest for 48 hours after the procedure or as told by your health care provider. °· Avoid or limit your caffeine use as told by your health care provider. °Contact a health care provider if: °· You feel like your heart is beating too quickly or your pulse is not regular. °· You have a serious muscle cramp that does not go away. °Get help right away if: °· You have discomfort in your chest. °· You are dizzy or you feel faint. °· You have trouble breathing or you are short of breath. °· Your speech is slurred. °· You have trouble moving an arm or leg on one side of your body. °· Your fingers or toes turn cold or blue. °This information is not intended to replace advice given to you by your health care provider. Make sure you discuss any questions you have with your health care provider. °Document Released: 10/05/2013 Document Revised: 07/18/2016 Document Reviewed: 06/20/2016 °Elsevier Interactive Patient Education © 2017 [employer] Inc[employer] ° °

## 2016-12-18 NOTE — Anesthesia Preprocedure Evaluation (Signed)
Anesthesia Evaluation   Patient awake    Reviewed: Allergy & Precautions, NPO status , Patient's Chart, lab work & pertinent test results  Airway Mallampati: II  TM Distance: >3 FB Neck ROM: Full    Dental  (+) Upper Dentures, Lower Dentures   Pulmonary asthma , sleep apnea , former smoker,    breath sounds clear to auscultation       Cardiovascular hypertension, Pt. on medications + Peripheral Vascular Disease  + dysrhythmias Atrial Fibrillation + pacemaker  Rhythm:Irregular     Neuro/Psych negative neurological ROS  negative psych ROS   GI/Hepatic Neg liver ROS, hiatal hernia, GERD  ,  Endo/Other  diabetes, Type 2Hypothyroidism   Renal/GU negative Renal ROS     Musculoskeletal  (+) Arthritis ,   Abdominal   Peds  Hematology negative hematology ROS (+)   Anesthesia Other Findings   Reproductive/Obstetrics                             Anesthesia Physical Anesthesia Plan  ASA: III  Anesthesia Plan: General   Post-op Pain Management:    Induction: Intravenous  Airway Management Planned: Mask  Additional Equipment: None  Intra-op Plan:   Post-operative Plan:   Informed Consent: I have reviewed the patients History and Physical, chart, labs and discussed the procedure including the risks, benefits and alternatives for the proposed anesthesia with the patient or authorized representative who has indicated his/her understanding and acceptance.   Dental advisory given  Plan Discussed with: CRNA and Surgeon  Anesthesia Plan Comments:         Anesthesia Quick Evaluation

## 2016-12-18 NOTE — CV Procedure (Signed)
    CARDIOVERSION NOTE  Procedure: Electrical Cardioversion Indications:  Atrial Fibrillation  Procedure Details:  Consent: Risks of procedure as well as the alternatives and risks of each were explained to the (patient/caregiver).  Consent for procedure obtained.  Time Out: Verified patient identification, verified procedure, site/side was marked, verified correct patient position, special equipment/implants available, medications/allergies/relevent history reviewed, required imaging and test results available.  Performed  Patient placed on cardiac monitor, pulse oximetry, supplemental oxygen as necessary.  Sedation given: Propofol per anesthesia Pacer pads placed anterior and posterior chest.  Cardioverted 1 time(s).  Cardioverted at 150J biphasic.  Impression: Findings: Post procedure EKG shows: NSR Complications: None Patient did tolerate procedure well.  Plan: 1. Successful DCCV to NSR with a single 150J biphasic shock. 2. [employer]. Jude pacemaker interrogation to follow.  Time Spent Directly with the Patient:  30 minutes   Pixie Casino, MD, [facility] Attending Cardiologist [place] 12/18/2016, 11:39 AM

## 2016-12-18 NOTE — Transfer of Care (Signed)
Immediate Anesthesia Transfer of Care Note  Patient: Clarence Dawson  Procedure(s) Performed: Procedure(s): CARDIOVERSION (N/A)  Patient Location: Endoscopy Unit  Anesthesia Type:General  Level of Consciousness: awake, alert  and oriented  Airway & Oxygen Therapy: Patient Spontanous Breathing and Patient connected to face mask oxygen  Post-op Assessment: Report given to RN and Post -op Vital signs reviewed and stable  Post vital signs: Reviewed and stable  Last Vitals:  Vitals:   12/18/16 1018  BP: (!) 97/46  Pulse: 90  Resp: (!) 92  Temp: 36.6 C    Last Pain:  Vitals:   12/18/16 1018  TempSrc: Oral         Complications: No apparent anesthesia complications

## 2016-12-18 NOTE — H&P (Signed)
    INTERVAL PROCEDURE H&P  History and Physical Interval Note:  12/18/2016 9:58 AM  Clarence Dawson has presented today for their planned procedure. The various methods of treatment have been discussed with the patient and family. After consideration of risks, benefits and other options for treatment, the patient has consented to the procedure.  The patients' outpatient history has been reviewed, patient examined, and no change in status from most recent office note within the past 30 days. I have reviewed the patients' chart and labs and will proceed as planned. Questions were answered to the patient's satisfaction.   Pixie Casino, MD, [facility] Attending Cardiologist [place] 12/18/2016, 9:58 AM

## 2016-12-19 ENCOUNTER — Encounter (HOSPITAL_COMMUNITY): Payer: Self-pay | Admitting: Internal Medicine

## 2016-12-25 ENCOUNTER — Encounter: Payer: Self-pay | Admitting: Internal Medicine

## 2016-12-25 ENCOUNTER — Ambulatory Visit (INDEPENDENT_AMBULATORY_CARE_PROVIDER_SITE_OTHER): Payer: Medicare Other | Admitting: Internal Medicine

## 2016-12-25 VITALS — BP 106/60 | HR 63 | Ht 69.0 in | Wt 243.0 lb

## 2016-12-25 DIAGNOSIS — L570 Actinic keratosis: Secondary | ICD-10-CM | POA: Diagnosis not present

## 2016-12-25 DIAGNOSIS — R05 Cough: Secondary | ICD-10-CM

## 2016-12-25 DIAGNOSIS — J45991 Cough variant asthma: Secondary | ICD-10-CM | POA: Diagnosis not present

## 2016-12-25 DIAGNOSIS — L820 Inflamed seborrheic keratosis: Secondary | ICD-10-CM | POA: Diagnosis not present

## 2016-12-25 DIAGNOSIS — R058 Other specified cough: Secondary | ICD-10-CM

## 2016-12-25 DIAGNOSIS — D225 Melanocytic nevi of trunk: Secondary | ICD-10-CM | POA: Diagnosis not present

## 2016-12-25 DIAGNOSIS — X32XXXD Exposure to sunlight, subsequent encounter: Secondary | ICD-10-CM | POA: Diagnosis not present

## 2016-12-25 MED ORDER — ALBUTEROL SULFATE HFA 108 (90 BASE) MCG/ACT IN AERS: INHALATION_SPRAY | RESPIRATORY_TRACT | 1 refills | Status: DC

## 2016-12-25 NOTE — Patient Instructions (Signed)
Plan A = Automatic = symbicort 160 Take 2 puffs first thing in am and then another 2 puffs about 12 hours later.   Work on inhaler technique:  relax and gently blow all the way out then take a nice smooth deep breath back in, triggering the inhaler at same time you start breathing in.  Hold for up to 5 seconds if you can. Blow out thru nose. Rinse and gargle with water when done   Plan B = Backup Only use your albuterol (proair) as a rescue medication to be used if you can't catch your breath by resting or doing a relaxed purse lip breathing pattern.  - The less you use it, the better it will work when you need it. - Ok to use the inhaler up to 2 puffs  every 4 hours if you must but call for appointment if use goes up over your usual need - Don't leave home without it !!  (think of it like the spare tire for your car)   Plan C = Crisis - only use your albuterol nebulizer if you first try Plan B and it fails to help > ok to use the nebulizer up to every 4 hours but if start needing it regularly call for immediate appointment    If you are satisfied with your treatment plan,  let your doctor know and he/she can either refill your medications or you can return here when your prescription runs out.     If in any way you are not 100% satisfied,  please tell us.  If 100% better, tell your friends!  Pulmonary follow up is as needed

## 2016-12-25 NOTE — Progress Notes (Signed)
Subjective:    Patient ID: Clarence Dawson, male    DOB: [birth year],     MRN: [id number]    Brief patient profile:  63 yowm quit smoking 1985 onset of breathing problems mid 2000s rx advair and helped a lot until around spring 2016 with cough varaiably productive not responding to abx x 2 / antiviral and referred to pulmonary clinic 01/24/2016 by Dr Willey Blade for refractory cough.   Brief patient profile:  01/24/2016 1st [facility] office visit/ Clarence Dawson  maint rx = advair 250 Chief Complaint  Patient presents with  . PULMONARY CONSULT    Referred by Dr. [name]. Pt c/o cough that is sometimes productive with white-brown thick mucus and some wheeze. Pt denies SOB/CP/tightness. Pt does have history of asthma since childhood. Pt is not on maintenance inhalers.   cough is worse before supper until 10 pm goes to sleep and does fine and then recurs p stirs each am  Not limited by breathing from desired activities   rec Stop lopressor and replace with bystolic 10 mg twice daily  Stop advair > Prednisone 10 mg take  4 each am x 2 days,   2 each am x 2 days,  1 each am x 2 days and stop  Pantoprazole (protonix) 40 mg   Take  30-60 min before first meal of the day and Pepcid (famotidine)  20 mg one @  bedtime until return to office - this is the best way to tell whether stomach acid is contributing to your problem.   GERD diet   02/12/2016  f/u ov/Clarence Dawson re:  Cough variant asthma vs uacs  Chief Complaint  Patient presents with  . Cough    Cough is improved since last OV.   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  Work on inhaler technique:   Ok to change back to [employer] to change back to the ranitidine when you finish up the pantoprazole     03/11/2016  f/u ov/Clarence Dawson re:  Chronic asthma/  symbicort 160 2bid   Chief Complaint  Patient presents with  . Follow-up    Cough had resolved until a  few days ago. He has minimal cough with green sputum.    cough worse in am/ ? Caught cold but notes top dentures don't fit whenever "has a cold" and now green mucus each am  Thinks symbicort much better than advair  rec Plan A = Automatic = Symbicort 80 Take 2 puffs first thing in am and then another 2 puffs about 12 hours later.  Plan B = Backup Only use your albuterol (proventil) as a rescue medication Please see patient coordinator before you leave today  to schedule sinus CT > neg Augmentin 875 mg take one pill twice daily  X 10 days> only took a few then stopped due to diarrhea  Cruise May 19 2016 > cough persisted  Knee surgery May 24/2017      06/23/2016  f/u ov/Clarence Dawson re:  Recurrent cough x one year Chief Complaint  Patient presents with  . Follow-up    Pt states that his cough is sometimes better and sometimes worse. Cough is prod with thick, light brown sputum.   cough is highly variable symbiocrt 80 2 every 12 hours / prn proventil  Tends to be worse p stirring in am for first hours  and one hour before hs  Always better when has been rx'd with prednisone / ? Better on 160 vs 80  rec Change symbicort to 160 Take 2 puffs first thing in am and then another 2 puffs about 12 hours later.  If not better Prednisone 10 mg take  4 each am x 2 days,   2 each am x 2 days,  1 each am x 2 days and stop  Please remember to go to the  x-ray department downstairs for your tests - we will call you with the results when they are available.    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 Follow-up    Pt c/o increased cough for the past wk, started while [place] was [place] the beach. He is coughing up brown sputum.    zpak should turn the mucus back to white - if not call me  No change on the symbicort dose = Take 2 puffs first thing in am and then another 2 puffs about 12 hours later.  For cough > mucinex dm up to 1200  mg every 12 hours  Finished pred end of Nov 2017 finished    12/02/2016 NP Acute OV  Pt presents for an acute office visit. Complains of 5 days of cough , congestion, drainage and wheezing . Coughing but hard to get mucus up . Not using otc meds.    Remains on Symbicort 160  2 bid Prednisone taper over next week.  Zpack take as directed,- use the one you have at home.  Mucinex Twice daily  As needed  As needed  Cough/congestion  Begin Delsym 2 tsp Twice daily  For cough.  Add Zyrtec '10mg'$  At bedtime  As needed  Drainage .  Hydromet 1 tsp every 6hr as needed for cough , may make you sleepy    12/05/2016 acute extended ov/Clarence Dawson re: asthma flare on symb 160 but poor hfa  Chief Complaint  Patient presents with  . Acute Visit    Pt states having increased SOB for the past 2 days. His chest feels tight and he coughs when he lies down. Cough is non prod.    Onset was acute and pattern is progressively worse despite using saba up to every 4 hours   hfa ( out of neb) with  assoc chest tightness/ hacking cough and wheeze.  rec zpak called in  Stop lopressor and start bisoprolol 5 mg twice daily in its place Stop delsym and for cough take mucinex dm up to 1200 mg every 12 hours  Prednisone 20 mg daily until better then 10 mg daily x 3 days and 5 mg x 3 days and stop   Plan A = Automatic = Symbicort 160 Take 2 puffs first thing in am and then another 2 puffs about 12 hours later.  Plan B = Backup Only use your albuterol as a rescue medication  Plan C = Crisis - only use your albuterol nebulizer if you first try Plan B      12/25/2016  f/u ov/Clarence Dawson re: asthma/ doing great on symbicort 160 2bid  Chief Complaint  Patient presents with  . Follow-up    Breathing is overall doing well.  He has not needed albuterol.      Not limited by breathing from desired activities    No obvious day to day or daytime variability or assoc excess/ purulent  sputum or mucus plugs or hemoptysis or cp or chest  tightness, subjective wheeze or overt sinus or hb symptoms. No unusual exp hx or h/o childhood pna/ asthma or knowledge of premature birth.  Sleeping ok without nocturnal  or early am exacerbation  of respiratory  c/o's or need for noct saba. Also denies any obvious fluctuation of symptoms with weather or environmental changes or other aggravating or alleviating factors except as outlined above   Current Medications, Allergies, Complete Past Medical History, Past Surgical History, Family History, and Social History were reviewed in [employer] record.  ROS  The following are not active complaints unless bolded sore throat, dysphagia, dental problems, itching, sneezing,  nasal congestion or excess/ purulent secretions, ear ache,   fever, chills, sweats, unintended wt loss, classically pleuritic or exertional cp,  orthopnea pnd or leg swelling, presyncope, palpitations, abdominal pain, anorexia, nausea, vomiting, diarrhea  or change in bowel or bladder habits, change in stools or urine, dysuria,hematuria,  rash, arthralgias, visual complaints, headache, numbness, weakness or ataxia or problems with walking or coordination,  change in mood/affect or memory.                   Objective:   Physical Exam  Obese   amb wm nad    02/12/2016 241 > 03/11/2016 243 > 06/23/2016  241 > 09/23/2016  241 > 12/05/2016  247 > 12/25/2016  246      Vital signs reviewed - Note on arrival 02 sats  97% on RA     HEENT: nl dentition, turbinates, and oropharynx. Nl external ear canals without cough reflex   NECK :  without JVD/Nodes/TM/ nl carotid upstrokes bilaterally   LUNGS: no acc muscle use,  Nl contour chest with  Clear BS bilaterally s wheeze    CV:  RRR  no s3 or murmur or increase in P2, no edema   ABD:  soft and nontender with nl inspiratory excursion in the supine position. No bruits or organomegaly, bowel sounds nl  MS:  Nl gait/ ext warm without deformities, calf  tenderness, cyanosis or clubbing No obvious joint restrictions   SKIN: warm and dry without lesions    NEURO:  alert, approp, nl sensorium with  no motor deficits       I personally reviewed images and agree with radiology impression as follows:  CXR:   12/05/16 Lingular/left basilar scarring.  No active disease.       Assessment & Plan:

## 2016-12-26 ENCOUNTER — Encounter (HOSPITAL_COMMUNITY): Payer: Self-pay | Admitting: Nurse Practitioner

## 2016-12-26 ENCOUNTER — Ambulatory Visit (HOSPITAL_COMMUNITY)
Admission: RE | Admit: 2016-12-26 | Discharge: 2016-12-26 | Disposition: A | Discharge status: Home / Self Care | Payer: Medicare Other | Source: Ambulatory Visit | Attending: Nurse Practitioner | Admitting: Nurse Practitioner

## 2016-12-26 VITALS — BP 104/62 | Ht 69.0 in | Wt 244.0 lb

## 2016-12-26 DIAGNOSIS — Z87891 Personal history of nicotine dependence: Secondary | ICD-10-CM | POA: Diagnosis not present

## 2016-12-26 DIAGNOSIS — M1990 Unspecified osteoarthritis, unspecified site: Secondary | ICD-10-CM | POA: Diagnosis not present

## 2016-12-26 DIAGNOSIS — Z888 Allergy status to other drugs, medicaments and biological substances status: Secondary | ICD-10-CM | POA: Diagnosis not present

## 2016-12-26 DIAGNOSIS — Z9049 Acquired absence of other specified parts of digestive tract: Secondary | ICD-10-CM | POA: Diagnosis not present

## 2016-12-26 DIAGNOSIS — I480 Paroxysmal atrial fibrillation: Secondary | ICD-10-CM | POA: Diagnosis not present

## 2016-12-26 DIAGNOSIS — Z91018 Allergy to other foods: Secondary | ICD-10-CM | POA: Insufficient documentation

## 2016-12-26 DIAGNOSIS — E119 Type 2 diabetes mellitus without complications: Secondary | ICD-10-CM | POA: Diagnosis not present

## 2016-12-26 DIAGNOSIS — E039 Hypothyroidism, unspecified: Secondary | ICD-10-CM | POA: Diagnosis not present

## 2016-12-26 DIAGNOSIS — Z91013 Allergy to seafood: Secondary | ICD-10-CM | POA: Diagnosis not present

## 2016-12-26 DIAGNOSIS — E785 Hyperlipidemia, unspecified: Secondary | ICD-10-CM | POA: Insufficient documentation

## 2016-12-26 DIAGNOSIS — K449 Diaphragmatic hernia without obstruction or gangrene: Secondary | ICD-10-CM | POA: Diagnosis not present

## 2016-12-26 DIAGNOSIS — K219 Gastro-esophageal reflux disease without esophagitis: Secondary | ICD-10-CM | POA: Insufficient documentation

## 2016-12-26 DIAGNOSIS — I4510 Unspecified right bundle-branch block: Secondary | ICD-10-CM | POA: Diagnosis not present

## 2016-12-26 DIAGNOSIS — I481 Persistent atrial fibrillation: Secondary | ICD-10-CM

## 2016-12-26 DIAGNOSIS — J45909 Unspecified asthma, uncomplicated: Secondary | ICD-10-CM | POA: Diagnosis not present

## 2016-12-26 DIAGNOSIS — Z8371 Family history of colonic polyps: Secondary | ICD-10-CM | POA: Insufficient documentation

## 2016-12-26 DIAGNOSIS — Z950 Presence of cardiac pacemaker: Secondary | ICD-10-CM | POA: Insufficient documentation

## 2016-12-26 DIAGNOSIS — Z7901 Long term (current) use of anticoagulants: Secondary | ICD-10-CM | POA: Diagnosis not present

## 2016-12-26 DIAGNOSIS — Z800 Family history of malignant neoplasm of digestive organs: Secondary | ICD-10-CM | POA: Insufficient documentation

## 2016-12-26 DIAGNOSIS — I10 Essential (primary) hypertension: Secondary | ICD-10-CM | POA: Diagnosis not present

## 2016-12-26 DIAGNOSIS — Z8249 Family history of ischemic heart disease and other diseases of the circulatory system: Secondary | ICD-10-CM | POA: Diagnosis not present

## 2016-12-26 DIAGNOSIS — Z7984 Long term (current) use of oral hypoglycemic drugs: Secondary | ICD-10-CM | POA: Insufficient documentation

## 2016-12-26 DIAGNOSIS — I4819 Other persistent atrial fibrillation: Secondary | ICD-10-CM

## 2016-12-26 DIAGNOSIS — G4733 Obstructive sleep apnea (adult) (pediatric): Secondary | ICD-10-CM | POA: Insufficient documentation

## 2016-12-26 NOTE — Progress Notes (Signed)
Primary Care Physician: [name], MD Referring Physician: Dr. 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 male with a h/o PAF, symptomatic brady 2/2 sinus node dysfunction , s/p PPM. He is being seen today in the atrial fibrillation clinic for evaluation. He statred feeling poorly early this week with exertional dyspnea and fatigue. He thought he may be in afib and wanted to be checked. Ekg confirme afib. He went into afib in August and was scheduled for cardioversion but converted to SR on his own. He reports that he has had two rounds of prednisone which he still currently taking and metoprolol recently stopped and bisoprolol was started for his breathing issues.   He returns today after tapering off prednisone and increasing bisoprolol and continues in afib with cvr.. He feels very fatigued and short of breath with activity. He is wishing to go ahead with cardioversion. He has not missed any doses of xarelto.  He returns after successful cardioversion 12/29 and feels much improved. He has cut back on alcohol and caffeine. He is reminded not to miss any doses of DOAC.   Today, he denies symptoms of palpitations, chest pain, shortness of breath, orthopnea, PND, lower extremity edema, dizziness, presyncope, syncope, or neurologic sequela. The patient is tolerating medications without difficulties and is otherwise without complaint today.   Past Medical History:  Diagnosis Date  . Allergic rhinitis   . Aortic valve disorder   . Asthma    since childhood- seasonal allergies induced  . Carotid artery stenosis   . Essential hypertension   . Full dentures   . GERD (gastroesophageal reflux disease)   . H/O hiatal hernia   . Hemorrhage of rectum   . Hyperlipidemia   . Hypothyroidism   . 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 OSA (obstructive sleep apnea)   . Osteoarthritis   . Pacemaker    Oct 2005 in [place].  Marland Kitchen PAF (paroxysmal atrial fibrillation) ([place])   . RBBB (right bundle branch block)     . Sinoatrial node dysfunction (HCC)   . Syncope   . Tricuspid valve disorder   . Type 2 diabetes mellitus ([place])    Past Surgical History:  Procedure Laterality Date  . A-V CARDIAC PACEMAKER INSERTION     Sick sinus syndrome DDR pacer  . Arthropathy  2005   Rebuilding of left thumb and joint   . CARDIAC CATHETERIZATION    . CARDIAC ELECTROPHYSIOLOGY STUDY AND ABLATION  09/2008   for pvcs, Dr. Loralie Champagne  . CARDIOVERSION N/A 12/18/2016   Procedure: CARDIOVERSION;  Surgeon: Pixie Casino, MD;  Location[facility]COPY;  Service: Cardiovascular;  Laterality: N/A;  . [place]   right wrist  . CARPAL TUNNEL RELEASE  05/04/2012   Procedure: CARPAL TUNNEL RELEASE;  Surgeon: Wynonia Sours, MD;  Location: [place];  Service: Orthopedics;  Laterality: Left;  . CARPOMETACARPEL SUSPENSION PLASTY Right 11/16/2014   Procedure: SUSPENSIONPLASTY RIGHT THUMB TENDON TRANSFER ABDUCTOR POLLICUS LONGUS EXCISION TRAPEZIUM;  Surgeon: Daryll Brod, MD;  Location: [facility];  Service: Orthopedics;  Laterality: Right;  . CHOLECYSTECTOMY  1994  . CIRCUMCISION    . COLONOSCOPY N/A 03/14/2013   Procedure: COLONOSCOPY;  Surgeon: Daneil Dolin, MD;  [facility];  Service: Endoscopy;  Laterality: N/A;  8:15 AM  . 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 EYE SURGERY  2012   Left eye Corneal transplant- partial- Cataract  .  GALLBLADDER SURGERY  12/01/2006  . HAND TENDON SURGERY Left late 1990's   thumb  . HEMORROIDECTOMY  2003  . left Knee Arthroscopy     [facility]  . PARTIAL KNEE ARTHROPLASTY  11/22/2012   Procedure: UNICOMPARTMENTAL KNEE;  Surgeon: Lorn Junes, MD;  Location: [place];  Service: Orthopedics;  Laterality: Left;  left unicompartmental knee arthroplasty  . PERMANENT PACEMAKER GENERATOR CHANGE N/A 01/12/2013   Procedure: PERMANENT PACEMAKER GENERATOR CHANGE;  Surgeon: Evans Lance, MD;   Location[facility] C[facility] LAB;  Service: Cardiovascular;  Laterality: N/A;  . Rotator cuff Surgery  2001   Right shoulder  . TONSILLECTOMY      Current Outpatient Prescriptions  Medication Sig Dispense Refill  . acetaminophen (TYLENOL) 500 MG tablet Take 500 mg by mouth every 6 (six) hours as needed. Pt said he takes 2 500 mg tablets  Every 6-8 hours ( averages 1 gm  Tid) and was told he could do so    . albuterol (ACCUNEB) 0.63 MG/3ML nebulizer solution Take 3 mLs (0.63 mg total) by nebulization every 4 (four) hours as needed for wheezing. 75 mL 1  . albuterol (PROAIR HFA) 108 (90 Base) MCG/ACT inhaler 2 puffs every 4 hours as needed only  if your can't catch your breath 1 Inhaler 1  . amLODipine (NORVASC) 10 MG tablet Take 10 mg by mouth daily.      . bisoprolol (ZEBETA) 5 MG tablet Take 7.5 mg by mouth 2 (two) times daily.    . budesonide-formoterol (SYMBICORT) 160-4.5 MCG/ACT inhaler Take 2 puffs first thing in am and then another 2 puffs about 12 hours later. 1 Inhaler 11  . canagliflozin (INVOKANA) 300 MG TABS tablet Take 300 mg by mouth daily before breakfast.    . cloNIDine (CATAPRES) 0.1 MG tablet Take 0.1 mg by mouth 2 (two) times daily.    Marland Kitchen glipiZIDE (GLUCOTROL XL) 2.5 MG 24 hr tablet Take 5 mg by mouth daily with breakfast.     . levothyroxine (SYNTHROID, LEVOTHROID) 150 MCG tablet Take 1 tablet by mouth daily.    Marland Kitchen losartan-hydrochlorothiazide (HYZAAR) 100-25 MG per tablet Take 1 tablet by mouth daily.    . Multiple Vitamin (MULTIVITAMIN) tablet Take 1 tablet by mouth daily.      . simvastatin (ZOCOR) 20 MG tablet Take 20 mg by mouth at bedtime.    Alveda Reasons 20 MG TABS tablet TAKE 1 TABLET BY MOUTH DAILY WITH SUPPER 90 tablet 1   No current facility-administered medications for this encounter.     Allergies  Allergen Reactions  . Food Anaphylaxis and Shortness Of Breath    Tree nuts- breathing difficulty, seafood- breathing difficulty  . Iodinated Diagnostic Agents Hives and  Shortness Of Breath    Patient states hives to throat closing.   . Iodine Itching and Other (See Comments)    Reaction unknown  . Clavulanic Acid Diarrhea  . Shellfish Allergy     unknown  . Voltaren [Diclofenac Sodium] Other (See Comments)    Feels like things are crawling on him    Social History   Social History  . Marital status: Widowed    Spouse name: N/A  . Number of children: N/A  . Years of education: N/A   Occupational History  . Not on file.   Social History Main Topics  . Smoking status: Former Smoker    Packs/day: 1.00    Years: 25.00    Types: Cigarettes  Start date: 02/20/1958    Quit date: 02/12/1984  . Smokeless tobacco: Never Used  . Alcohol use 1.2 oz/week    1 Glasses of wine, 1 Cans of beer per week     Comment: 1 can of beer or glasse of wine daily  . Drug use: No  . Sexual activity: Not on file   Other Topics Concern  . Not on file   Social History Narrative   Regular exercise: No    Family History  Problem Relation Age of Onset  . Other Father 55    Sudden Cardiac death  . Pancreatic cancer Mother   . Colon cancer Mother   . Colon cancer Maternal Aunt   . Colon polyps Neg Hx     ROS- All systems are reviewed and negative except as per the HPI above  Physical Exam: Vitals:   12/26/16 0935  BP: 104/62  Weight: 244 lb (110.7 kg)  Height: '5\' 9"'$  (1.753 m)   Wt Readings from Last 3 Encounters:  12/26/16 244 lb (110.7 kg)  12/25/16 243 lb (110.2 kg)  12/18/16 243 lb (110.2 kg)    Labs: Lab Results  Component Value Date   NA 139 12/17/2016   K 3.9 12/17/2016   CL 102 12/17/2016   CO2 31 12/17/2016   GLUCOSE 289 (H) 12/17/2016   BUN 21 (H) 12/17/2016   CREATININE 1.30 (H) 12/17/2016   CALCIUM 9.4 12/17/2016   Lab Results  Component Value Date   INR 0.91 01/10/2013   No results found for: CHOL, HDL, LDLCALC, TRIG   GEN- The patient is well appearing, alert and oriented x 3 today.   Head- normocephalic,  atraumatic Eyes-  Sclera clear, conjunctiva pink Ears- hearing intact Oropharynx- clear Neck- supple, no JVP Lymph- no cervical lymphadenopathy Lungs- Clear to ausculation bilaterally, normal work of breathing Heart-  regular rate and rhythm, no murmurs, rubs or gallops, PMI not laterally displaced GI- soft, NT, ND, + BS Extremities- no clubbing, cyanosis, or edema MS- no significant deformity or atrophy Skin- no rash or lesion Psych- euthymic mood, full affect Neuro- strength and sensation are intact  EKG- atrial paced with PAC's, RBBB Epic records reviewed    Assessment and Plan: 1. Paroxysmal afib Back in SR after successful cardioversion 12/21 Probably precipitated by decrease of BB and steroid use, now off Continue bisoprolol  7.5 mg bid Continue xarelto  F/u with Dr. Lovena Le as scheduled F/u in afib clinic as needed  [place]. Jaeline Whobrey, [facility] [address] [place], [place] [address] [phone]

## 2016-12-28 ENCOUNTER — Encounter: Payer: Self-pay | Admitting: Internal Medicine

## 2016-12-28 NOTE — Assessment & Plan Note (Addendum)
02/12/2016    try symbicort 160 2bid instead of advair  - 03/11/2016   try symbicort 80 2bid  - 06/23/2016 flared on symbicort 80 with FENO  = 75 rec 160 2bid  - FENO 09/23/2016  =  32   On symbicort 160 2bid > no change rx   All goals of chronic asthma control met including optimal function and elimination of symptoms with minimal need for rescue therapy.  Contingencies discussed in full including contacting this office immediately if not controlling the symptoms using the rule of two's and presented in a Plan ABC format- see avs     - 12/25/2016  After extensive coaching HFA effectiveness =    90% > pulmonary f/u prn   Each maintenance medication was reviewed in detail including most importantly the difference between maintenance and as needed and under what circumstances the prns are to be used.  Please see AVS for  customized  Instructions which were reviewed in detail in writing with the patient and a copy provided.

## 2016-12-28 NOTE — Assessment & Plan Note (Signed)
-   Allergy profile 03/11/2016 >  Eos 0.2 /  IgE  70 Pos RAST Mold > dust > cockroach   - Sinus CT 03/14/16 > Mild inflammatory changes s acute change  Cough has remained controlled s ppi/h2 but rec he take them otc for any cough flare following the principle that frequently cough for any reason can cause secondary GERD.

## 2017-01-12 ENCOUNTER — Other Ambulatory Visit: Payer: Self-pay | Admitting: Orthopedic Surgery

## 2017-01-12 DIAGNOSIS — M25562 Pain in left knee: Principal | ICD-10-CM

## 2017-01-12 DIAGNOSIS — G8929 Other chronic pain: Secondary | ICD-10-CM

## 2017-01-12 DIAGNOSIS — S83242A Other tear of medial meniscus, current injury, left knee, initial encounter: Secondary | ICD-10-CM | POA: Diagnosis not present

## 2017-01-13 ENCOUNTER — Telehealth: Payer: Self-pay | Admitting: Internal Medicine

## 2017-01-13 NOTE — Telephone Encounter (Signed)
Ok to hold Xarelto for procedure. GT

## 2017-01-13 NOTE — Telephone Encounter (Signed)
[facility] calling to request pt be off his Xarelto for one day prior to CT Authogram ordered by [facility] cannot schedule test until we ok stopping blood thinner. Fax ok to [phone] if not ok pls call [phone].

## 2017-01-13 NOTE — Telephone Encounter (Signed)
Will route to Dr. Lovena Le for review and advisement,.

## 2017-01-13 NOTE — Telephone Encounter (Signed)
Faxed to [facility]. 

## 2017-01-15 ENCOUNTER — Telehealth: Payer: Self-pay

## 2017-01-15 NOTE — Telephone Encounter (Signed)
I spoke with patient about his 13-hour prep. He states Prednisone throws him into AFib, and he was just cardioverted a few weeks ago back into SR.  He states the bad reaction he had was about 20 years ago with what sounds like an IVP. Patient states he's had a previous CT arthrogram recently and did "just fine" with Benadryl '50mg'$  PO an hour before.  After speaking with our medical director (Dr. Logan Bores), I called patient back and told him we would cancel the 13-hour prep for this CT arthrogram and have him take the Benadryl '50mg'$  PO at 1345 on Monday, January 26, 2017.  He also knows to hold his Xarelto Sunday evening (01/25/17) and to resume it Monday evening after the arthrogram. Brita Romp, RN

## 2017-01-15 NOTE — Telephone Encounter (Signed)
I left a message for patient that I've called in his 13-hour prep to [facility], along with the dosing instructions (Prednisone '50mg'$  PO 01/26/17 @ 0145, '@0745'$  and @ 1345 and to take Benadryl '50mg'$  PO with 1345 dose).  I left him my number in case he had any questions about the prescription.  Also, I reminded him not to take his Xarelto on 1/28 or 1/29//18 until after his procedure.  jkl

## 2017-01-26 ENCOUNTER — Ambulatory Visit
Admission: RE | Admit: 2017-01-26 | Discharge: 2017-01-26 | Disposition: A | Discharge status: Home / Self Care | Payer: Medicare Other | Source: Ambulatory Visit | Attending: Orthopedic Surgery | Admitting: Orthopedic Surgery

## 2017-01-26 DIAGNOSIS — M25562 Pain in left knee: Principal | ICD-10-CM

## 2017-01-26 DIAGNOSIS — G8929 Other chronic pain: Secondary | ICD-10-CM

## 2017-01-26 MED ORDER — IOPAMIDOL (ISOVUE-M 200) INJECTION 41%
31.0000 mL | Freq: Once | INTRAMUSCULAR | Status: AC
  Administered 2017-01-26: 31 mL via INTRA_ARTICULAR

## 2017-02-02 ENCOUNTER — Ambulatory Visit (INDEPENDENT_AMBULATORY_CARE_PROVIDER_SITE_OTHER): Payer: Medicare Other | Admitting: *Deleted

## 2017-02-02 DIAGNOSIS — M170 Bilateral primary osteoarthritis of knee: Secondary | ICD-10-CM | POA: Diagnosis not present

## 2017-02-02 DIAGNOSIS — I495 Sick sinus syndrome: Secondary | ICD-10-CM | POA: Diagnosis not present

## 2017-02-02 NOTE — Progress Notes (Signed)
Remote pacemaker transmission.   

## 2017-02-03 LAB — CUP PACEART REMOTE DEVICE CHECK
Battery Remaining Percentage: 95.5 %
Battery Voltage: 2.98 V
Brady Statistic AP VP Percent: 1.6 %
Brady Statistic RA Percent Paced: 97 %
Date Time Interrogation Session: 20180205074802
Implantable Lead Location: 753859
Implantable Pulse Generator Implant Date: 20140115
Lead Channel Impedance Value: 660 Ohm
Lead Channel Pacing Threshold Amplitude: 0.75 V
Lead Channel Pacing Threshold Amplitude: 0.75 V
Lead Channel Pacing Threshold Pulse Width: 0.4 ms
Lead Channel Sensing Intrinsic Amplitude: 2.9 mV
MDC IDC LEAD IMPLANT DT: 20051106
MDC IDC LEAD IMPLANT DT: 20051106
MDC IDC LEAD LOCATION: 753860
MDC IDC MSMT BATTERY REMAINING LONGEVITY: 122 mo
MDC IDC MSMT LEADCHNL RA IMPEDANCE VALUE: 380 Ohm
MDC IDC MSMT LEADCHNL RV PACING THRESHOLD PULSEWIDTH: 0.4 ms
MDC IDC MSMT LEADCHNL RV SENSING INTR AMPL: 11.2 mV
MDC IDC SET LEADCHNL RA PACING AMPLITUDE: 2 V
MDC IDC SET LEADCHNL RV PACING AMPLITUDE: 2.5 V
MDC IDC SET LEADCHNL RV PACING PULSEWIDTH: 0.4 ms
MDC IDC SET LEADCHNL RV SENSING SENSITIVITY: 2 mV
MDC IDC STAT BRADY AP VS PERCENT: 96 %
MDC IDC STAT BRADY AS VP PERCENT: 1 %
MDC IDC STAT BRADY AS VS PERCENT: 1.9 %
MDC IDC STAT BRADY RV PERCENT PACED: 1.7 %
Pulse Gen Model: 2210
Pulse Gen Serial Number: 7439597

## 2017-02-04 ENCOUNTER — Encounter: Payer: Self-pay | Admitting: Cardiology

## 2017-02-17 DIAGNOSIS — S83241A Other tear of medial meniscus, current injury, right knee, initial encounter: Secondary | ICD-10-CM | POA: Diagnosis not present

## 2017-02-19 DIAGNOSIS — I8311 Varicose veins of right lower extremity with inflammation: Secondary | ICD-10-CM | POA: Diagnosis not present

## 2017-02-19 DIAGNOSIS — I83811 Varicose veins of right lower extremities with pain: Secondary | ICD-10-CM | POA: Diagnosis not present

## 2017-02-19 DIAGNOSIS — I83893 Varicose veins of bilateral lower extremities with other complications: Secondary | ICD-10-CM | POA: Diagnosis not present

## 2017-02-25 ENCOUNTER — Telehealth: Payer: Self-pay | Admitting: Internal Medicine

## 2017-02-25 ENCOUNTER — Other Ambulatory Visit: Payer: Self-pay | Admitting: Internal Medicine

## 2017-02-25 ENCOUNTER — Other Ambulatory Visit: Payer: Self-pay | Admitting: Orthopedic Surgery

## 2017-02-25 DIAGNOSIS — M25561 Pain in right knee: Secondary | ICD-10-CM

## 2017-02-25 NOTE — Telephone Encounter (Signed)
New Message    Request for surgical clearance:  1. What type of surgery is being performed? Arthogram  2. When is this surgery scheduled? Has not been scheduled  3. Are there any medications that need to be held prior to surgery and how long? Want to know if he can stop blood thinner  4. Name of physician performing surgery? One of the radiologist, unsure at this time who  5. What is your office phone and fax number? [phone] Fax: [phone]

## 2017-02-26 NOTE — Telephone Encounter (Signed)
Follow UP     Anda Kraft from [facility] is calling to have the clearance fax over to [place] at [phone] .  Wants to know if patient can come off of the Xarelto for one day .

## 2017-02-26 NOTE — Telephone Encounter (Signed)
Called, spoke with Anda Kraft at [facility]. Need clearance to hold Xarelto. Pt needs CT/Arthogram of left knee. Informed will forward to Dr. Lovena Le to advise.

## 2017-02-26 NOTE — Telephone Encounter (Signed)
Follow up     Clarence Dawson needs a call asap regarding taking this pt of Xarelto for 1 day

## 2017-02-26 NOTE — Telephone Encounter (Signed)
Sent clearance documents to medical records to be faxed to [phone]. Dr. Lovena Le stated "Hold Xarelto 2 days before surgery. Low risk".

## 2017-02-27 ENCOUNTER — Telehealth: Payer: Self-pay

## 2017-02-27 NOTE — Telephone Encounter (Signed)
Sent clearance documents to medical records to be faxed to [facility] at [phone]. Dr. Lovena Le stated "Ok to hold Xarelto for 1 day".

## 2017-03-04 DIAGNOSIS — I83811 Varicose veins of right lower extremities with pain: Secondary | ICD-10-CM | POA: Diagnosis not present

## 2017-03-04 DIAGNOSIS — Z79899 Other long term (current) drug therapy: Secondary | ICD-10-CM | POA: Diagnosis not present

## 2017-03-04 DIAGNOSIS — E119 Type 2 diabetes mellitus without complications: Secondary | ICD-10-CM | POA: Diagnosis not present

## 2017-03-06 ENCOUNTER — Ambulatory Visit
Admission: RE | Admit: 2017-03-06 | Discharge: 2017-03-06 | Disposition: A | Discharge status: Home / Self Care | Payer: Medicare Other | Source: Ambulatory Visit | Attending: Orthopedic Surgery | Admitting: Orthopedic Surgery

## 2017-03-06 DIAGNOSIS — M25561 Pain in right knee: Secondary | ICD-10-CM

## 2017-03-06 MED ORDER — IOPAMIDOL (ISOVUE-M 200) INJECTION 41%
35.0000 mL | Freq: Once | INTRAMUSCULAR | Status: AC
  Administered 2017-03-06: 35 mL via INTRA_ARTICULAR

## 2017-03-10 DIAGNOSIS — D179 Benign lipomatous neoplasm, unspecified: Secondary | ICD-10-CM | POA: Diagnosis not present

## 2017-03-10 DIAGNOSIS — Z6834 Body mass index (BMI) 34.0-34.9, adult: Secondary | ICD-10-CM | POA: Diagnosis not present

## 2017-03-10 DIAGNOSIS — I480 Paroxysmal atrial fibrillation: Secondary | ICD-10-CM | POA: Diagnosis not present

## 2017-03-10 DIAGNOSIS — E1129 Type 2 diabetes mellitus with other diabetic kidney complication: Secondary | ICD-10-CM | POA: Diagnosis not present

## 2017-03-11 ENCOUNTER — Telehealth: Payer: Self-pay | Admitting: Internal Medicine

## 2017-03-11 NOTE — Telephone Encounter (Signed)
Follow Up:   Would you also send surgical clearance to Dr Elsie Saas office,his surgeon.Please fax to [phone] [email or web address]

## 2017-03-12 DIAGNOSIS — I83891 Varicose veins of right lower extremities with other complications: Secondary | ICD-10-CM | POA: Diagnosis not present

## 2017-03-12 DIAGNOSIS — I8311 Varicose veins of right lower extremity with inflammation: Secondary | ICD-10-CM | POA: Diagnosis not present

## 2017-03-16 DIAGNOSIS — M1711 Unilateral primary osteoarthritis, right knee: Secondary | ICD-10-CM | POA: Diagnosis not present

## 2017-03-16 NOTE — Telephone Encounter (Signed)
Clarence Dawson confirmed she is requesting surgical clearance form dated 02/17/17  for right knee scope by Dr Noemi Chapel, surgery date:pending. This form had been completed by Dr Lovena Le, scanned in to Epic and had been faxed 03/02/17. Clarence Dawson states Dr Noemi Chapel did not receive fax, I will place in medical records fax basket to be re faxed to Dr Noemi Chapel.

## 2017-03-16 NOTE — Telephone Encounter (Signed)
Follow Up:   The reason I did not get the surgical clearance information was,because the nurse said she already faxed it several times. I was just trying to get what she had already faxed sent back to her.

## 2017-03-16 NOTE — Telephone Encounter (Signed)
Sylvia--please get more information. We need to know type of surgery, any special requests for holding medications, etc. There is a smart phrase for surgical clearance.  Thanks.

## 2017-03-18 ENCOUNTER — Other Ambulatory Visit: Payer: Medicare Other

## 2017-03-23 DIAGNOSIS — M1711 Unilateral primary osteoarthritis, right knee: Secondary | ICD-10-CM | POA: Diagnosis not present

## 2017-03-30 DIAGNOSIS — Z87891 Personal history of nicotine dependence: Secondary | ICD-10-CM | POA: Diagnosis not present

## 2017-03-30 DIAGNOSIS — E039 Hypothyroidism, unspecified: Secondary | ICD-10-CM | POA: Diagnosis not present

## 2017-03-30 DIAGNOSIS — H17813 Minor opacity of cornea, bilateral: Secondary | ICD-10-CM | POA: Diagnosis not present

## 2017-03-30 DIAGNOSIS — Z9841 Cataract extraction status, right eye: Secondary | ICD-10-CM | POA: Diagnosis not present

## 2017-03-30 DIAGNOSIS — Z79899 Other long term (current) drug therapy: Secondary | ICD-10-CM | POA: Diagnosis not present

## 2017-03-30 DIAGNOSIS — Z950 Presence of cardiac pacemaker: Secondary | ICD-10-CM | POA: Diagnosis not present

## 2017-03-30 DIAGNOSIS — Z7984 Long term (current) use of oral hypoglycemic drugs: Secondary | ICD-10-CM | POA: Diagnosis not present

## 2017-03-30 DIAGNOSIS — Z888 Allergy status to other drugs, medicaments and biological substances status: Secondary | ICD-10-CM | POA: Diagnosis not present

## 2017-03-30 DIAGNOSIS — I10 Essential (primary) hypertension: Secondary | ICD-10-CM | POA: Diagnosis not present

## 2017-03-30 DIAGNOSIS — E785 Hyperlipidemia, unspecified: Secondary | ICD-10-CM | POA: Diagnosis not present

## 2017-03-30 DIAGNOSIS — Z9849 Cataract extraction status, unspecified eye: Secondary | ICD-10-CM | POA: Diagnosis not present

## 2017-03-30 DIAGNOSIS — Z947 Corneal transplant status: Secondary | ICD-10-CM | POA: Diagnosis not present

## 2017-03-30 DIAGNOSIS — E119 Type 2 diabetes mellitus without complications: Secondary | ICD-10-CM | POA: Diagnosis not present

## 2017-03-30 DIAGNOSIS — J45909 Unspecified asthma, uncomplicated: Secondary | ICD-10-CM | POA: Diagnosis not present

## 2017-03-30 DIAGNOSIS — Z9889 Other specified postprocedural states: Secondary | ICD-10-CM | POA: Diagnosis not present

## 2017-03-30 DIAGNOSIS — M1711 Unilateral primary osteoarthritis, right knee: Secondary | ICD-10-CM | POA: Diagnosis not present

## 2017-03-30 DIAGNOSIS — H5231 Anisometropia: Secondary | ICD-10-CM | POA: Diagnosis not present

## 2017-03-30 DIAGNOSIS — H43812 Vitreous degeneration, left eye: Secondary | ICD-10-CM | POA: Diagnosis not present

## 2017-03-30 DIAGNOSIS — Z961 Presence of intraocular lens: Secondary | ICD-10-CM | POA: Diagnosis not present

## 2017-03-30 DIAGNOSIS — Z9842 Cataract extraction status, left eye: Secondary | ICD-10-CM | POA: Diagnosis not present

## 2017-04-02 DIAGNOSIS — I8311 Varicose veins of right lower extremity with inflammation: Secondary | ICD-10-CM | POA: Diagnosis not present

## 2017-04-02 DIAGNOSIS — I83811 Varicose veins of right lower extremities with pain: Secondary | ICD-10-CM | POA: Diagnosis not present

## 2017-04-07 DIAGNOSIS — I8311 Varicose veins of right lower extremity with inflammation: Secondary | ICD-10-CM | POA: Diagnosis not present

## 2017-04-07 DIAGNOSIS — I83891 Varicose veins of right lower extremities with other complications: Secondary | ICD-10-CM | POA: Diagnosis not present

## 2017-04-09 DIAGNOSIS — I8311 Varicose veins of right lower extremity with inflammation: Secondary | ICD-10-CM | POA: Diagnosis not present

## 2017-04-14 DIAGNOSIS — I83891 Varicose veins of right lower extremities with other complications: Secondary | ICD-10-CM | POA: Diagnosis not present

## 2017-04-14 DIAGNOSIS — I8311 Varicose veins of right lower extremity with inflammation: Secondary | ICD-10-CM | POA: Diagnosis not present

## 2017-04-16 DIAGNOSIS — I8311 Varicose veins of right lower extremity with inflammation: Secondary | ICD-10-CM | POA: Diagnosis not present

## 2017-04-17 ENCOUNTER — Other Ambulatory Visit (HOSPITAL_COMMUNITY): Payer: Self-pay | Admitting: Nurse Practitioner

## 2017-04-28 DIAGNOSIS — I83891 Varicose veins of right lower extremities with other complications: Secondary | ICD-10-CM | POA: Diagnosis not present

## 2017-04-28 DIAGNOSIS — I8311 Varicose veins of right lower extremity with inflammation: Secondary | ICD-10-CM | POA: Diagnosis not present

## 2017-05-04 ENCOUNTER — Ambulatory Visit (INDEPENDENT_AMBULATORY_CARE_PROVIDER_SITE_OTHER): Payer: Medicare Other | Admitting: *Deleted

## 2017-05-04 DIAGNOSIS — I495 Sick sinus syndrome: Secondary | ICD-10-CM

## 2017-05-04 NOTE — Progress Notes (Signed)
Remote pacemaker transmission.   

## 2017-05-05 LAB — CUP PACEART REMOTE DEVICE CHECK
Battery Remaining Longevity: 117 mo
Battery Voltage: 2.98 V
Brady Statistic AS VP Percent: 1 %
Brady Statistic AS VS Percent: 2.9 %
Brady Statistic RA Percent Paced: 86 %
Implantable Lead Implant Date: 20051106
Implantable Lead Location: 753859
Implantable Pulse Generator Implant Date: 20140115
Lead Channel Impedance Value: 380 Ohm
Lead Channel Impedance Value: 640 Ohm
Lead Channel Pacing Threshold Amplitude: 0.75 V
Lead Channel Pacing Threshold Pulse Width: 0.4 ms
Lead Channel Sensing Intrinsic Amplitude: 10.1 mV
Lead Channel Setting Pacing Amplitude: 2 V
Lead Channel Setting Pacing Amplitude: 2.5 V
MDC IDC LEAD IMPLANT DT: 20051106
MDC IDC LEAD LOCATION: 753860
MDC IDC MSMT BATTERY REMAINING PERCENTAGE: 95.5 %
MDC IDC MSMT LEADCHNL RA PACING THRESHOLD AMPLITUDE: 0.75 V
MDC IDC MSMT LEADCHNL RA PACING THRESHOLD PULSEWIDTH: 0.4 ms
MDC IDC MSMT LEADCHNL RA SENSING INTR AMPL: 3.2 mV
MDC IDC PG SERIAL: 7439597
MDC IDC SESS DTM: 20180507122312
MDC IDC SET LEADCHNL RV PACING PULSEWIDTH: 0.4 ms
MDC IDC SET LEADCHNL RV SENSING SENSITIVITY: 2 mV
MDC IDC STAT BRADY AP VP PERCENT: 2 %
MDC IDC STAT BRADY AP VS PERCENT: 95 %
MDC IDC STAT BRADY RV PERCENT PACED: 3.6 %

## 2017-05-08 ENCOUNTER — Encounter: Payer: Self-pay | Admitting: Cardiology

## 2017-05-12 DIAGNOSIS — I8311 Varicose veins of right lower extremity with inflammation: Secondary | ICD-10-CM | POA: Diagnosis not present

## 2017-05-12 DIAGNOSIS — I83891 Varicose veins of right lower extremities with other complications: Secondary | ICD-10-CM | POA: Diagnosis not present

## 2017-05-26 DIAGNOSIS — I8311 Varicose veins of right lower extremity with inflammation: Secondary | ICD-10-CM | POA: Diagnosis not present

## 2017-05-26 DIAGNOSIS — I83811 Varicose veins of right lower extremities with pain: Secondary | ICD-10-CM | POA: Diagnosis not present

## 2017-06-05 ENCOUNTER — Inpatient Hospital Stay (HOSPITAL_COMMUNITY): Admission: RE | Admit: 2017-06-05 | Payer: Medicare Other | Source: Ambulatory Visit

## 2017-06-08 DIAGNOSIS — I8311 Varicose veins of right lower extremity with inflammation: Secondary | ICD-10-CM | POA: Diagnosis not present

## 2017-06-08 DIAGNOSIS — I83891 Varicose veins of right lower extremities with other complications: Secondary | ICD-10-CM | POA: Diagnosis not present

## 2017-06-15 DIAGNOSIS — R21 Rash and other nonspecific skin eruption: Secondary | ICD-10-CM | POA: Diagnosis not present

## 2017-06-18 DIAGNOSIS — D225 Melanocytic nevi of trunk: Secondary | ICD-10-CM | POA: Diagnosis not present

## 2017-06-18 DIAGNOSIS — L570 Actinic keratosis: Secondary | ICD-10-CM | POA: Diagnosis not present

## 2017-06-18 DIAGNOSIS — L255 Unspecified contact dermatitis due to plants, except food: Secondary | ICD-10-CM | POA: Diagnosis not present

## 2017-06-18 DIAGNOSIS — X32XXXD Exposure to sunlight, subsequent encounter: Secondary | ICD-10-CM | POA: Diagnosis not present

## 2017-06-24 DIAGNOSIS — I83891 Varicose veins of right lower extremities with other complications: Secondary | ICD-10-CM | POA: Diagnosis not present

## 2017-06-24 DIAGNOSIS — I8311 Varicose veins of right lower extremity with inflammation: Secondary | ICD-10-CM | POA: Diagnosis not present

## 2017-07-07 DIAGNOSIS — I10 Essential (primary) hypertension: Secondary | ICD-10-CM | POA: Diagnosis not present

## 2017-07-07 DIAGNOSIS — I480 Paroxysmal atrial fibrillation: Secondary | ICD-10-CM | POA: Diagnosis not present

## 2017-07-07 DIAGNOSIS — E119 Type 2 diabetes mellitus without complications: Secondary | ICD-10-CM | POA: Diagnosis not present

## 2017-07-07 DIAGNOSIS — Z79899 Other long term (current) drug therapy: Secondary | ICD-10-CM | POA: Diagnosis not present

## 2017-07-09 DIAGNOSIS — I83891 Varicose veins of right lower extremities with other complications: Secondary | ICD-10-CM | POA: Diagnosis not present

## 2017-07-09 DIAGNOSIS — I8311 Varicose veins of right lower extremity with inflammation: Secondary | ICD-10-CM | POA: Diagnosis not present

## 2017-07-13 DIAGNOSIS — I872 Venous insufficiency (chronic) (peripheral): Secondary | ICD-10-CM | POA: Diagnosis not present

## 2017-07-13 DIAGNOSIS — I83891 Varicose veins of right lower extremities with other complications: Secondary | ICD-10-CM | POA: Diagnosis not present

## 2017-07-14 DIAGNOSIS — E119 Type 2 diabetes mellitus without complications: Secondary | ICD-10-CM | POA: Diagnosis not present

## 2017-07-14 DIAGNOSIS — I10 Essential (primary) hypertension: Secondary | ICD-10-CM | POA: Diagnosis not present

## 2017-07-14 DIAGNOSIS — I480 Paroxysmal atrial fibrillation: Secondary | ICD-10-CM | POA: Diagnosis not present

## 2017-07-27 DIAGNOSIS — M1711 Unilateral primary osteoarthritis, right knee: Secondary | ICD-10-CM | POA: Diagnosis not present

## 2017-08-03 ENCOUNTER — Ambulatory Visit (INDEPENDENT_AMBULATORY_CARE_PROVIDER_SITE_OTHER): Payer: Medicare Other | Admitting: *Deleted

## 2017-08-03 DIAGNOSIS — I495 Sick sinus syndrome: Secondary | ICD-10-CM | POA: Diagnosis not present

## 2017-08-03 LAB — CUP PACEART REMOTE DEVICE CHECK
Battery Remaining Longevity: 119 mo
Battery Remaining Percentage: 95.5 %
Battery Voltage: 2.98 V
Brady Statistic AP VS Percent: 95 %
Brady Statistic RA Percent Paced: 85 %
Brady Statistic RV Percent Paced: 4.4 %
Date Time Interrogation Session: 20180806065426
Implantable Lead Implant Date: 20051106
Implantable Lead Implant Date: 20051106
Implantable Lead Location: 753859
Lead Channel Impedance Value: 410 Ohm
Lead Channel Pacing Threshold Pulse Width: 0.4 ms
Lead Channel Sensing Intrinsic Amplitude: 3.2 mV
Lead Channel Setting Sensing Sensitivity: 2 mV
MDC IDC LEAD LOCATION: 753860
MDC IDC MSMT LEADCHNL RA PACING THRESHOLD AMPLITUDE: 0.75 V
MDC IDC MSMT LEADCHNL RV IMPEDANCE VALUE: 690 Ohm
MDC IDC MSMT LEADCHNL RV PACING THRESHOLD AMPLITUDE: 0.75 V
MDC IDC MSMT LEADCHNL RV PACING THRESHOLD PULSEWIDTH: 0.4 ms
MDC IDC MSMT LEADCHNL RV SENSING INTR AMPL: 12 mV
MDC IDC PG IMPLANT DT: 20140115
MDC IDC PG SERIAL: 7439597
MDC IDC SET LEADCHNL RA PACING AMPLITUDE: 2 V
MDC IDC SET LEADCHNL RV PACING AMPLITUDE: 2.5 V
MDC IDC SET LEADCHNL RV PACING PULSEWIDTH: 0.4 ms
MDC IDC STAT BRADY AP VP PERCENT: 2.4 %
MDC IDC STAT BRADY AS VP PERCENT: 1 %
MDC IDC STAT BRADY AS VS PERCENT: 2.7 %

## 2017-08-04 NOTE — Progress Notes (Signed)
Remote pacemaker transmission.   

## 2017-08-05 ENCOUNTER — Encounter: Payer: Self-pay | Admitting: Cardiology

## 2017-08-11 ENCOUNTER — Encounter (HOSPITAL_COMMUNITY): Payer: Self-pay | Admitting: Physician Assistant

## 2017-08-11 ENCOUNTER — Telehealth: Payer: Self-pay | Admitting: Internal Medicine

## 2017-08-11 DIAGNOSIS — M1711 Unilateral primary osteoarthritis, right knee: Secondary | ICD-10-CM | POA: Diagnosis not present

## 2017-08-11 HISTORY — DX: Unilateral primary osteoarthritis, right knee: M17.11

## 2017-08-11 NOTE — Telephone Encounter (Signed)
Pt called to request information on how long he should hold Xarelto prior to knee surgery.  Per review of surgical clearance completed by Dr. Pedro Earls for 2 days prior to procedure.  Pt low risk.  Notified Pt of Dr. Tanna Furry note.  Pt indicates understanding.  No further needs.

## 2017-08-11 NOTE — H&P (Signed)
UNI VS TOTAL KNEE ADMISSION H&P  Patient is being admitted for right uni vs total knee arthroplasty.  Left knee cortisone injection and manipulation under anesthesia  Subjective:  Chief Complaint:right knee pain. Left knee stiffness  HPI: Clarence Dawson, 71 y.o. male, has a history of pain and functional disability in the right knee due to arthritis and has failed non-surgical conservative treatments for greater than 12 weeks to includeNSAID's and/or analgesics, corticosteriod injections, viscosupplementation injections, flexibility and strengthening excercises, supervised PT with diminished ADL's post treatment, use of assistive devices, weight reduction as appropriate and activity modification.  Onset of symptoms was gradual, starting 10 years ago with gradually worsening course since that time. The patient noted prior procedures on the knee to include  arthroscopy and menisectomy on the right knee(s).  Patient currently rates pain in the right knee(s) at 9 out of 10 with activity. Patient has night pain, worsening of pain with activity and weight bearing, pain that interferes with activities of daily living, crepitus and joint swelling.  Patient has evidence of subchondral sclerosis, periarticular osteophytes and joint space narrowing by imaging studies.There is no active infection.  Patient Active Problem List   Diagnosis Date Noted  . Primary localized osteoarthritis of right knee 08/11/2017  . Asthma with acute exacerbation 12/08/2016  . Chest pain 10/20/2016  . HTN (hypertension) 10/20/2016  . Morbid obesity due to excess calories ([place]) 06/24/2016  . Cough variant asthma 02/12/2016  . Upper airway cough syndrome 01/24/2016  . 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 Essential hypertension   . Persistent atrial fibrillation ([place])   . Syncope   . Hyperthyroidism   . Hyperlipidemia   . RBBB (right bundle branch block)   . Tricuspid valve disorder   . Aortic valve disorder   . Carotid artery  stenosis   . Diabetes ([place])   . Pacemaker   . OSA (obstructive sleep apnea)   . Arthritis   . Sinoatrial node dysfunction (HCC)   . PVC's (premature ventricular contractions) 07/17/2011  . Essential hypertension, benign 01/24/2011  . Premature ventricular contractions 01/24/2011  . PPM-St.Jude 01/24/2011   Past Medical History:  Diagnosis Date  . Allergic rhinitis   . Aortic valve disorder   . Asthma    since childhood- seasonal allergies induced  . Carotid artery stenosis   . Essential hypertension   . Full dentures   . GERD (gastroesophageal reflux disease)   . H/O hiatal hernia   . Hemorrhage of rectum   . Hyperlipidemia   . Hypothyroidism   . 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 OSA (obstructive sleep apnea)   . Osteoarthritis   . Pacemaker    Oct 2005 in [place].  Marland Kitchen PAF (paroxysmal atrial fibrillation) ([place])   . Primary localized osteoarthritis of right knee 08/11/2017  . RBBB (right bundle branch block)   . Sinoatrial node dysfunction (HCC)   . Syncope   . Tricuspid valve disorder   . Type 2 diabetes mellitus ([place])     Past Surgical History:  Procedure Laterality Date  . A-V CARDIAC PACEMAKER INSERTION     Sick sinus syndrome DDR pacer  . Arthropathy  2005   Rebuilding of left thumb and joint   . CARDIAC CATHETERIZATION    . CARDIAC ELECTROPHYSIOLOGY STUDY AND ABLATION  09/2008   for pvcs, Dr. Loralie Champagne  . CARDIOVERSION N/A 12/18/2016   Procedure: CARDIOVERSION;  Surgeon: Pixie Casino, MD;  Location: [facility];  Service: Cardiovascular;  Laterality: N/A;  . CARPAL TUNNEL  RELEASE  1994   right wrist  . CARPAL TUNNEL RELEASE  05/04/2012   Procedure: CARPAL TUNNEL RELEASE;  Surgeon: Wynonia Sours, MD;  Location: [facility];  Service: Orthopedics;  Laterality: Left;  . CARPOMETACARPEL SUSPENSION PLASTY Right 11/16/2014   Procedure: SUSPENSIONPLASTY RIGHT THUMB TENDON TRANSFER ABDUCTOR POLLICUS LONGUS EXCISION TRAPEZIUM;  Surgeon: Daryll Brod, MD;  Location:  [facility];  Service: Orthopedics;  Laterality: Right;  . CHOLECYSTECTOMY  1994  . CIRCUMCISION    . COLONOSCOPY N/A 03/14/2013   Procedure: COLONOSCOPY;  Surgeon: Daneil Dolin, MD;  [facility];  Service: Endoscopy;  Laterality: N/A;  8:15 AM  . EYE SURGERY     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-[facility]  . EYE SURGERY  2012   Left eye Corneal transplant- partial- Cataract  . GALLBLADDER SURGERY  12/01/2006  . HAND TENDON SURGERY Left late 1990's   thumb  . HEMORROIDECTOMY  2003  . left Knee Arthroscopy     [facility]  . PARTIAL KNEE ARTHROPLASTY  11/22/2012   Procedure: UNICOMPARTMENTAL KNEE;  Surgeon: Lorn Junes, MD;  Location: [facility];  Service: Orthopedics;  Laterality: Left;  left unicompartmental knee arthroplasty  . PERMANENT PACEMAKER GENERATOR CHANGE N/A 01/12/2013   Procedure: PERMANENT PACEMAKER GENERATOR CHANGE;  Surgeon: Evans Lance, MD;  Location: [facility] CATH LAB;  Service: Cardiovascular;  Laterality: N/A;  . Rotator cuff Surgery  2001   Right shoulder  . TONSILLECTOMY      No current facility-administered medications for this encounter.   Current Outpatient Prescriptions:  .  acetaminophen (TYLENOL) 500 MG tablet, Take 500 mg by mouth every 6 (six) hours as needed. Pt said he takes 2 500 mg tablets  Every 6-8 hours ( averages 1 gm  Tid) and was told he could do so, Disp: , Rfl:  .  albuterol (PROAIR HFA) 108 (90 Base) MCG/ACT inhaler, 2 puffs every 4 hours as needed only  if your can't catch your breath, Disp: 1 Inhaler, Rfl: 1 .  amLODipine (NORVASC) 10 MG tablet, Take 10 mg by mouth daily.  , Disp: , Rfl:  .  bisoprolol (ZEBETA) 5 MG tablet, TAKE 1 AND 1/2 TABLET BY MOUTH TWICE DAILY., Disp: 90 tablet, Rfl: 3 .  budesonide-formoterol (SYMBICORT) 160-4.5 MCG/ACT inhaler, Take 2 puffs first thing in am and then another 2 puffs about 12 hours later., Disp: 1 Inhaler, Rfl: 11 .  canagliflozin (INVOKANA) 300 MG TABS tablet,  Take 300 mg by mouth daily before breakfast., Disp: , Rfl:  .  cloNIDine (CATAPRES) 0.1 MG tablet, Take 0.1 mg by mouth 2 (two) times daily., Disp: , Rfl:  .  glipiZIDE (GLUCOTROL XL) 2.5 MG 24 hr tablet, Take 5 mg by mouth daily with breakfast. , Disp: , Rfl:  .  levothyroxine (SYNTHROID, LEVOTHROID) 150 MCG tablet, Take 1 tablet by mouth daily., Disp: , Rfl:  .  losartan-hydrochlorothiazide (HYZAAR) 100-25 MG per tablet, Take 1 tablet by mouth daily., Disp: , Rfl:  .  Multiple Vitamin (MULTIVITAMIN) tablet, Take 1 tablet by mouth daily.  , Disp: , Rfl:  .  simvastatin (ZOCOR) 20 MG tablet, Take 20 mg by mouth at bedtime., Disp: , Rfl:  .  XARELTO 20 MG TABS tablet, TAKE 1 TABLET BY MOUTH DAILY WITH SUPPER, Disp: 90 tablet, Rfl: 1 .  albuterol (ACCUNEB) 0.63 MG/3ML nebulizer solution, Take 3 mLs (0.63 mg total) by nebulization every 4 (four) hours as needed for wheezing.,  Disp: 75 mL, Rfl: 1   Allergies  Allergen Reactions  . Food Anaphylaxis and Shortness Of Breath    Tree nuts- breathing difficulty, seafood- breathing difficulty  . Iodinated Diagnostic Agents Hives and Shortness Of Breath    Patient states hives to throat closing. (01/15/17: patient states this reaction was "about 20 years ago" with possibly an IVP.  He now says high doses of prednisone "throw me into AFib."  He has tolerated CT arthrograms with Benadrly 50mg  PO one hour before injection.  Brita Romp, RN)  . Prednisone Other (See Comments)    "throws me into AFib"  . Clavulanic Acid Diarrhea  . Shellfish Allergy Other (See Comments)    To shellfish, crabs.  Makes him feel like "things are crawling all over" me.  Denies airway issues with these foods.  Brita Romp, RN 01/15/17)  . Voltaren [Diclofenac Sodium] Other (See Comments)    Feels like things are crawling on him    Social History  Substance Use Topics  . Smoking status: Former Smoker    Packs/day: 1.00    Years: 25.00    Types: Cigarettes    Start date:  02/20/1958    Quit date: 02/12/1984  . Smokeless tobacco: Never Used  . Alcohol use 1.2 oz/week    1 Glasses of wine, 1 Cans of beer per week     Comment: 1 can of beer or glasse of wine daily    Family History  Problem Relation Age of Onset  . Other Father 80       Sudden Cardiac death  . Pancreatic cancer Mother   . Colon cancer Mother   . Colon cancer Maternal Aunt   . Colon polyps Neg Hx      Review of Systems  Constitutional: Negative.   HENT: Negative.   Eyes: Negative.   Respiratory: Negative.   Cardiovascular: Negative.   Gastrointestinal: Negative.   Genitourinary: Negative.   Musculoskeletal: Positive for back pain and joint pain.  Skin: Negative.   Neurological: Negative.   Endo/Heme/Allergies: Negative.   Psychiatric/Behavioral: Negative.     Objective:  Physical Exam  Constitutional: He is oriented to person, place, and time. He appears well-developed and well-nourished.  HENT:  Head: Normocephalic and atraumatic.  Mouth/Throat: Oropharynx is clear and moist.  Eyes: Pupils are equal, round, and reactive to light. Conjunctivae are normal.  Neck: Neck supple.  Cardiovascular: Normal rate.   Murmur heard. Respiratory: Effort normal.  GI: Soft.  Genitourinary:  Genitourinary Comments: Not pertinent to current symptomatology therefore not examined.  Musculoskeletal:  Examination of his right knee reveals pain medially.  1+ synovitis.  1+ crepitation.  Range of motion 0-120 degrees.  Knee is stable with normal patella tracking.  Mild varus deformity.  Examination of his left knee reveals previous well healed unicompartmental incision with minimal swelling.  Mild pain.  Range of motion is from 0-90 degrees.  Knee is stable with normal patella tracking.  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  He does have some chronic venous disease, but no signs of any active infection.  Pulses at anterior  ankle are easily hear on doppler but not palpable.  Neurological: He is alert and oriented to person, place, and time.  Skin: Skin is warm and dry.  Psychiatric: He has a normal mood and affect. His behavior is normal.    Vital signs in last 24 hours:    Labs:  Estimated body mass index is 36.03 kg/m as calculated from the following:   Height as of 12/26/16: 5\' 9"  (1.753 m).   Weight as of 12/26/16: 110.7 kg (244 lb).   Imaging Review Plain radiographs demonstrate severe degenerative joint disease of the right knee(s). The overall alignment ismild varus. The bone quality appears to be good for age and reported activity level.  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 Principal Problem:   Primary localized osteoarthritis of right knee Active Problems:   Essential hypertension, benign   Premature ventricular contractions   PPM-St.Jude   Persistent atrial fibrillation (HCC)   Syncope   Hyperthyroidism   Hyperlipidemia   RBBB (right bundle branch block)   Tricuspid valve disorder   Aortic valve disorder   Carotid artery stenosis   Diabetes (HCC)   OSA (obstructive sleep apnea)   Morbid obesity due to excess calories ([place])   Asthma with acute exacerbation   The patient history, physical examination, clinical judgment of the provider and imaging studies are consistent with end stage degenerative joint disease of the right knee(s) and total knee arthroplasty is deemed medically necessary. The treatment options including medical management, injection therapy arthroscopy and arthroplasty were discussed at length. The risks and benefits of total knee arthroplasty were presented and reviewed. The risks due to aseptic loosening, infection, stiffness, patella tracking problems, thromboembolic complications and other imponderables were discussed. The patient acknowledged the explanation, agreed to  proceed with the plan and consent was signed. Patient is being admitted for inpatient treatment for surgery, pain control, PT, OT, prophylactic antibiotics, VTE prophylaxis, progressive ambulation and ADL's and discharge planning. The patient is planning to be discharged home with home health services

## 2017-08-11 NOTE — Telephone Encounter (Signed)
New message  Pt states [facility] called him and told him to ask how long he should hold Xarelto prior to his knee replacement surgery.      [facility] Pre-operative Risk Assessment    Request for surgical clearance:  1. What type of surgery is being performed? Knee replacement at [facility]  2. When is this surgery scheduled? 08/24/17  3. Are there any medications that need to be held prior to surgery and how long? Xarelto  4. Name of physician performing surgery? Dr. Para March  5. What is your office phone and fax number? [place] 08/11/2017, 1:22 PM  _________________________________________________________________   (provider comments below)

## 2017-08-13 ENCOUNTER — Encounter (HOSPITAL_COMMUNITY): Payer: Self-pay

## 2017-08-13 ENCOUNTER — Encounter (HOSPITAL_COMMUNITY)
Admission: RE | Admit: 2017-08-13 | Discharge: 2017-08-13 | Disposition: A | Discharge status: Home / Self Care | Payer: Medicare Other | Source: Ambulatory Visit | Attending: Orthopedic Surgery | Admitting: Orthopedic Surgery

## 2017-08-13 DIAGNOSIS — Z01818 Encounter for other preprocedural examination: Secondary | ICD-10-CM | POA: Diagnosis not present

## 2017-08-13 DIAGNOSIS — K219 Gastro-esophageal reflux disease without esophagitis: Secondary | ICD-10-CM | POA: Diagnosis not present

## 2017-08-13 DIAGNOSIS — E785 Hyperlipidemia, unspecified: Secondary | ICD-10-CM | POA: Insufficient documentation

## 2017-08-13 DIAGNOSIS — J45909 Unspecified asthma, uncomplicated: Secondary | ICD-10-CM | POA: Diagnosis not present

## 2017-08-13 DIAGNOSIS — I4510 Unspecified right bundle-branch block: Secondary | ICD-10-CM | POA: Diagnosis not present

## 2017-08-13 DIAGNOSIS — I6529 Occlusion and stenosis of unspecified carotid artery: Secondary | ICD-10-CM | POA: Diagnosis not present

## 2017-08-13 DIAGNOSIS — Z7902 Long term (current) use of antithrombotics/antiplatelets: Secondary | ICD-10-CM | POA: Insufficient documentation

## 2017-08-13 DIAGNOSIS — I495 Sick sinus syndrome: Secondary | ICD-10-CM | POA: Insufficient documentation

## 2017-08-13 DIAGNOSIS — I4891 Unspecified atrial fibrillation: Secondary | ICD-10-CM | POA: Diagnosis not present

## 2017-08-13 DIAGNOSIS — E039 Hypothyroidism, unspecified: Secondary | ICD-10-CM | POA: Diagnosis not present

## 2017-08-13 DIAGNOSIS — Z7984 Long term (current) use of oral hypoglycemic drugs: Secondary | ICD-10-CM | POA: Insufficient documentation

## 2017-08-13 DIAGNOSIS — Z950 Presence of cardiac pacemaker: Secondary | ICD-10-CM | POA: Diagnosis not present

## 2017-08-13 DIAGNOSIS — I10 Essential (primary) hypertension: Secondary | ICD-10-CM | POA: Insufficient documentation

## 2017-08-13 DIAGNOSIS — Z87891 Personal history of nicotine dependence: Secondary | ICD-10-CM | POA: Diagnosis not present

## 2017-08-13 DIAGNOSIS — E119 Type 2 diabetes mellitus without complications: Secondary | ICD-10-CM | POA: Insufficient documentation

## 2017-08-13 HISTORY — DX: Pneumonia, unspecified organism: J18.9

## 2017-08-13 HISTORY — DX: Malignant (primary) neoplasm, unspecified: C80.1

## 2017-08-13 LAB — BASIC METABOLIC PANEL
Anion gap: 9 (ref 5–15)
BUN: 23 mg/dL — AB (ref 6–20)
CHLORIDE: 104 mmol/L (ref 101–111)
CO2: 28 mmol/L (ref 22–32)
CREATININE: 1.21 mg/dL (ref 0.61–1.24)
Calcium: 9.5 mg/dL (ref 8.9–10.3)
GFR calc Af Amer: >60 mL/min (ref >60)
GFR calc non Af Amer: 58 mL/min — ABNORMAL LOW (ref >60)
Glucose, Bld: 132 mg/dL — ABNORMAL HIGH (ref 65–99)
Potassium: 4.9 mmol/L (ref 3.5–5.1)
SODIUM: 141 mmol/L (ref 135–145)

## 2017-08-13 LAB — CBC
HCT: 53.3 % — ABNORMAL HIGH (ref 39.0–52.0)
Hemoglobin: 18.4 g/dL — ABNORMAL HIGH (ref 13.0–17.0)
MCH: 31.1 pg (ref 26.0–34.0)
MCHC: 34.5 g/dL (ref 30.0–36.0)
MCV: 90.2 fL (ref 78.0–100.0)
PLATELETS: 154 10*3/uL (ref 150–400)
RBC: 5.91 MIL/uL — ABNORMAL HIGH (ref 4.22–5.81)
RDW: 12.8 % (ref 11.5–15.5)
WBC: 7 10*3/uL (ref 4.0–10.5)

## 2017-08-13 LAB — TYPE AND SCREEN
ABO/RH(D): A POS
ANTIBODY SCREEN: NEGATIVE

## 2017-08-13 LAB — HEMOGLOBIN A1C
Hgb A1c MFr Bld: 7.2 % — ABNORMAL HIGH (ref 4.8–5.6)
Mean Plasma Glucose: 159.94 mg/dL

## 2017-08-13 LAB — SURGICAL PCR SCREEN
MRSA, PCR: NEGATIVE
Staphylococcus aureus: NEGATIVE

## 2017-08-13 NOTE — Pre-Procedure Instructions (Signed)
Clarence Dawson  08/13/2017   Your procedure is scheduled on Monday, August 27.  Report to [facility] Admitting at 8:45 AM                For any other questions, please call [phone], Monday - Friday 8 AM - 4 PM.   Call this number if you have problems the morning of surgery: [phone]- pre- op desk                For any other questions, please call [phone], Monday - Friday 8 AM - 4 PM.   Remember:  Do not eat food or drink liquids after midnight Sunday, August 26.                                   Take these medicines the morning of surgery with A SIP OF WATER:  amLODipine (NORVASC),   bisoprolol (ZEBETA),   cloNIDine (CATAPRES),  levothyroxine (SYNTHROID, LEVOTHROID).   May use Albuterol Nebulizer or inhaler if needed, bring inhaler to the hospital with you.                  DO NOT take   glipiZIDE (GLUCOTROL XL) or canagliflozin (INVOKANA) the morning of surgery.                May take Tylenol if needed.                    1 Week prior to surgery STOP taking Aspirin, Aspirin Products (Goody Powder, Excedrin Migraine), Ibuprofen (Advil), Naproxen (Aleve), Viaamins and Herbal Products (ie Fish Oil).                  Stop Xarelto 2 days prior to surgery as approved by Dr Lovena Le   How to Manage Your Diabetes Before and After Surgery  Why is it important to control my blood sugar before and after surgery? . Improving blood sugar levels before and after surgery helps healing and can limit problems. . A way of improving blood sugar control is eating a healthy diet by: o  Eating less sugar and carbohydrates o  Increasing activity/exercise o  Talking with your doctor about reaching your blood sugar goals . High blood sugars (greater than 180 mg/dL) can raise your risk of infections and slow your recovery, so you will need to focus on controlling your diabetes during the weeks before surgery. . Make sure that the doctor who takes care of your diabetes knows  about your planned surgery including the date and location.  How do I manage my blood sugar before surgery? . Check your blood sugar at least 4 times a day, starting 2 days before surgery, to make sure that the level is not too high or low. o Check your blood sugar the morning of your surgery when you wake up and every 2 hours until you get to the Short Stay unit. . If your blood sugar is less than 70 mg/dL, you will need to treat for low blood sugar: o Do not take insulin. o Treat a low blood sugar (less than 70 mg/dL) with  cup of clear juice (cranberry or apple), 4 glucose tablets, OR glucose gel. o Recheck blood sugar in 15 minutes after treatment (to make sure it is greater than 70 mg/dL). If your blood sugar is not greater than 70 mg/dL on recheck,  call [phone] for further instructions. . Report your blood sugar to the short stay nurse when you get to Short Stay.  . If you are admitted to the hospital after surgery: o Your blood sugar will be checked by the staff and you will probably be given insulin after surgery (instead of oral diabetes medicines) to make sure you have good blood sugar levels. o The goal for blood sugar control after surgery is 80-180 mg/dL.     WHAT DO I DO ABOUT MY DIABETES MEDICATION?   Marland Kitchen Do not take oral diabetes medicines (pills) the morning of surgery.  Special Instrustions:  [facility]- Preparing For Surgery  Before surgery, you can play an important role. Because skin is not sterile, your skin needs to be as free of germs as possible. You can reduce the number of germs on your skin by washing with CHG (chlorahexidine gluconate) Soap before surgery.  CHG is an antiseptic cleaner which kills germs and bonds with the skin to continue killing germs even after washing.  Please do not use if you have an allergy to CHG or antibacterial soaps. If your skin becomes reddened/irritated stop using the CHG.  Do not shave (including legs and underarms) for at  least 48 hours prior to first CHG shower. It is OK to shave your face.  Please follow these instructions carefully.   1. Shower the NIGHT BEFORE SURGERY and the MORNING OF SURGERY with CHG.   2. If you chose to wash your hair, wash your hair first as usual with your normal shampoo.  3. After you shampoo, rinse your hair and body thoroughly to remove the shampoo. Wash your face and private area with the soap you use at home, then rinse.  4. Use CHG as you would any other liquid soap. You can apply CHG directly to the skin and wash gently with a scrungie or a clean washcloth.   5. Apply the CHG Soap to your body ONLY FROM THE NECK DOWN.  Do not use on open wounds or open sores. Avoid contact with your eyes, ears, mouth and genitals (private parts). Wash genitals (private parts) with your normal soap.  6. Wash thoroughly, paying special attention to the area where your surgery will be performed.  7. Thoroughly rinse your body with warm water from the neck down.  8. DO NOT shower/wash with your normal soap after using and rinsing off the CHG Soap.  9. Pat yourself dry with a CLEAN TOWEL.   10. Wear CLEAN PAJAMAS   11. Place CLEAN SHEETS on your bed the night of your first shower and DO NOT SLEEP WITH PETS.  Day of Surgery: Shower as above Do not apply any deodorants/lotions, powder or cologne. Please wear clean clothes to the hospital/surgery center.    Do not wear jewelry, make-up or nail polish.  Do not shave 48 hours prior to surgery.  Men may shave face and neck.  Do not bring valuables to the hospital.  [facility] is not responsible for any belongings or valuables.  Contacts, dentures or bridgework may not be worn into surgery.  Leave your suitcase in the car.  After surgery it may be brought to your room.  For patients admitted to the hospital, discharge time will be determined by your treatment team.  Patients discharged the day of surgery will not be allowed to drive home.    Please read over the following fact sheets that you were given: Pain Booklet, Patient Instructions for Mupirocin  Application, Incentive Spirometry, Surgical Site Infections.

## 2017-08-14 LAB — URINE CULTURE: Culture: NO GROWTH

## 2017-08-14 NOTE — Progress Notes (Addendum)
Anesthesia Chart Review:  Pt is a 71 year old male scheduled for R unicompartmental knee, R knee arthroscopy with medial meniscectomy and 08/24/2017 with Elsie Saas, M.D.  - PCP is [name], MD - Cardiologist is Cristopher Peru, MD  PMH includes: PAF (s/p cardioversion 12/18/16), SA node dysfunction, pacemaker (St. Jude),  Aortic stenosis (mild-moderate by 10/21/16 echo ), RBBB, syncope, HTN, DM, hyperlipidemia, carotid artery stenosis, asthma, OSA, hypothyroidism, GERD. Former smoker. BMI 34.  Medications include: Albuterol, amlodipine, bisoprolol, Symbicort, canagliflozin, clonidine, glipizide, levothyroxine, losartan-HCTZ, metformin, Prilosec, simvastatin, xarelto.  - I reached out to Dr. Lovena Le about holding xarelto prior to surgery, and received ok from Eino Farber, PharmD to hold xarelto 3 days before surgery. I notified pt by telephone last dose xarelto would be 08/20/17  Preoperative labs reviewed. HbA1c 7.2, glucose 132.  CXR 12/05/16: Lingular/left basilar scarring. No active disease.  EKG 12/26/16: Sinus rhyhm with atrial paced beats present. PACs. RBBB  Echo 10/21/16:  - Left ventricle: The cavity size was normal. Wall thickness was increased in a pattern of mild LVH. Systolic function was normal. The estimated ejection fraction was in the range of 60% to 65%. Wall motion was normal; there were no regional wall motion abnormalities. Features are consistent with a pseudonormal left ventricular filling pattern, with concomitant abnormal relaxation and increased filling pressure (grade 2 diastolic dysfunction). - Aortic valve: Mildly to moderately calcified annulus. Moderately calcified leaflets. There was mild to moderate stenosis. Valve area (VTI): 1.3 cm^2. - Mitral valve: Calcified annulus. There was trivial regurgitation. - Left atrium: The atrium was mildly to moderately dilated. - Right ventricle: Pacer wire or catheter noted in right ventricle. - Right atrium: Central venous  pressure (est): 3 mm Hg. - Tricuspid valve: There was trivial regurgitation. - Pulmonary arteries: PA peak pressure: 48 mm Hg (S). - Pericardium, extracardiac: A prominent pericardial fat pad was present. - Impressions:  Mild LVH with LVEF 60-65%. Grade 2 diastolic dysfunction. Mild to moderate left atrial enlargement. Mildly calcified mitral annulus with trivial mitral regurgitation. Moderately calcified aortic valve, leaflet structure difficult to identify. There is evidence of mild to moderate aortic stenosis. Device wire present in the right heart. Trivial tricuspid regurgitation with PASP 48 mmHg.  Carotid duplex 05/05/11:  1.  No evidence of hemodynamically significant stenosis involvin either the right or left carotid circulation in the neck by Dopple criteria. 2.  Moderate mixed calcified and noncalcified plaque involving the carotid circulations bilaterally in the neck. 3.  Antegrade flow in both vertebral arteries.  Perioperative prescription form for pacemaker documents patient is intermittently atrial dependent on pacemaker. Procedure should not interfere with device function, no device reprogramming magnet placement needed.  If no changes, I anticipate pt can proceed with surgery as scheduled.    Willeen Cass, FNP-BC [facility] Short Stay Surgical Center/Anesthesiology Phone: [phone] 08/14/2017 3:45 PM

## 2017-08-17 ENCOUNTER — Telehealth: Payer: Self-pay | Admitting: Internal Medicine

## 2017-08-17 NOTE — Telephone Encounter (Signed)
Spoke with pt, requested pt send in a manual transmission. Pt stated he would.

## 2017-08-17 NOTE — Telephone Encounter (Signed)
Transmission received, pt c/o of HRs in 120's pt stated that he would like to be seen prior to knee surgery next week, pt agreeable to appointment with Roderic Palau 08/18/17 at 1:30pm

## 2017-08-17 NOTE — Telephone Encounter (Signed)
NEw Message  Pt call requesting to speak with the device clinic. Pt states he checked his pulse and felt it beating irregular. Pt would like his reading for last night to be reviewed. Pt would like to speak with Rn to discuss. Please call back to discuss

## 2017-08-18 ENCOUNTER — Ambulatory Visit (HOSPITAL_COMMUNITY)
Admission: RE | Admit: 2017-08-18 | Discharge: 2017-08-18 | Disposition: A | Discharge status: Home / Self Care | Payer: Medicare Other | Source: Ambulatory Visit | Attending: Nurse Practitioner | Admitting: Nurse Practitioner

## 2017-08-18 ENCOUNTER — Other Ambulatory Visit: Payer: Self-pay

## 2017-08-18 ENCOUNTER — Encounter (HOSPITAL_COMMUNITY): Payer: Self-pay | Admitting: Nurse Practitioner

## 2017-08-18 VITALS — BP 112/64 | Ht 70.0 in | Wt 239.0 lb

## 2017-08-18 DIAGNOSIS — Z8249 Family history of ischemic heart disease and other diseases of the circulatory system: Secondary | ICD-10-CM | POA: Insufficient documentation

## 2017-08-18 DIAGNOSIS — Z7902 Long term (current) use of antithrombotics/antiplatelets: Secondary | ICD-10-CM | POA: Diagnosis not present

## 2017-08-18 DIAGNOSIS — Z85828 Personal history of other malignant neoplasm of skin: Secondary | ICD-10-CM | POA: Insufficient documentation

## 2017-08-18 DIAGNOSIS — M1990 Unspecified osteoarthritis, unspecified site: Secondary | ICD-10-CM | POA: Diagnosis not present

## 2017-08-18 DIAGNOSIS — Z800 Family history of malignant neoplasm of digestive organs: Secondary | ICD-10-CM | POA: Diagnosis not present

## 2017-08-18 DIAGNOSIS — J45909 Unspecified asthma, uncomplicated: Secondary | ICD-10-CM | POA: Insufficient documentation

## 2017-08-18 DIAGNOSIS — K219 Gastro-esophageal reflux disease without esophagitis: Secondary | ICD-10-CM | POA: Insufficient documentation

## 2017-08-18 DIAGNOSIS — I6529 Occlusion and stenosis of unspecified carotid artery: Secondary | ICD-10-CM | POA: Insufficient documentation

## 2017-08-18 DIAGNOSIS — Z91018 Allergy to other foods: Secondary | ICD-10-CM | POA: Diagnosis not present

## 2017-08-18 DIAGNOSIS — Z87891 Personal history of nicotine dependence: Secondary | ICD-10-CM | POA: Insufficient documentation

## 2017-08-18 DIAGNOSIS — G4733 Obstructive sleep apnea (adult) (pediatric): Secondary | ICD-10-CM | POA: Insufficient documentation

## 2017-08-18 DIAGNOSIS — Z7984 Long term (current) use of oral hypoglycemic drugs: Secondary | ICD-10-CM | POA: Diagnosis not present

## 2017-08-18 DIAGNOSIS — E039 Hypothyroidism, unspecified: Secondary | ICD-10-CM | POA: Insufficient documentation

## 2017-08-18 DIAGNOSIS — I480 Paroxysmal atrial fibrillation: Secondary | ICD-10-CM | POA: Insufficient documentation

## 2017-08-18 DIAGNOSIS — Z950 Presence of cardiac pacemaker: Secondary | ICD-10-CM | POA: Diagnosis not present

## 2017-08-18 DIAGNOSIS — I4510 Unspecified right bundle-branch block: Secondary | ICD-10-CM | POA: Insufficient documentation

## 2017-08-18 DIAGNOSIS — Z9049 Acquired absence of other specified parts of digestive tract: Secondary | ICD-10-CM | POA: Diagnosis not present

## 2017-08-18 DIAGNOSIS — Z9889 Other specified postprocedural states: Secondary | ICD-10-CM | POA: Insufficient documentation

## 2017-08-18 DIAGNOSIS — Z888 Allergy status to other drugs, medicaments and biological substances status: Secondary | ICD-10-CM | POA: Insufficient documentation

## 2017-08-18 DIAGNOSIS — E785 Hyperlipidemia, unspecified: Secondary | ICD-10-CM | POA: Diagnosis not present

## 2017-08-18 DIAGNOSIS — I10 Essential (primary) hypertension: Secondary | ICD-10-CM | POA: Insufficient documentation

## 2017-08-18 DIAGNOSIS — E119 Type 2 diabetes mellitus without complications: Secondary | ICD-10-CM | POA: Insufficient documentation

## 2017-08-18 DIAGNOSIS — I471 Supraventricular tachycardia: Secondary | ICD-10-CM

## 2017-08-18 DIAGNOSIS — Z947 Corneal transplant status: Secondary | ICD-10-CM | POA: Diagnosis not present

## 2017-08-18 DIAGNOSIS — Z91013 Allergy to seafood: Secondary | ICD-10-CM | POA: Diagnosis not present

## 2017-08-18 NOTE — Progress Notes (Signed)
Primary Care Physician: [name], MD Referring Physician: [facility]   Clarence Dawson is a 71 y.o. male with a h/o PPM that noted a rapid pulse yesterday. He sent in a strip that showed a wide tachycardia and was referred here. He is pending knee surgery Monday 8/27, and has to stop his anticoagulation this Friday. He knows of no trigger.  Today, he denies symptoms of palpitations, chest pain, shortness of breath, orthopnea, PND, lower extremity edema, dizziness, presyncope, syncope, or neurologic sequela. The patient is tolerating medications without difficulties and is otherwise without complaint today.   Past Medical History:  Diagnosis Date  . Allergic rhinitis   . Aortic valve disorder   . Asthma    since childhood- seasonal allergies induced  . Cancer ([place])    Skin cancer- squamous, basal  . Carotid artery stenosis   . Essential hypertension   . Full dentures   . GERD (gastroesophageal reflux disease)   . H/O hiatal hernia   . Hemorrhage of rectum   . Hyperlipidemia   . Hypothyroidism   . 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 OSA (obstructive sleep apnea)   . Osteoarthritis   . Pacemaker    Oct 2005 in [place].  Marland Kitchen PAF (paroxysmal atrial fibrillation) ([place])   . Pneumonia    "several Times" 2015 last time  . Primary localized osteoarthritis of right knee 08/11/2017  . RBBB (right bundle branch block)   . Sinoatrial node dysfunction (H[facility])   . Syncope   . Tricuspid valve disorder   . Type 2 diabetes mellitus ([place])    Type II   Past Surgical History:  Procedure Laterality Date  . A-V CARDIAC PACEMAKER INSERTION     Sick sinus syndrome DDR pacer  . Arthropathy Right 2005   Rebuilding of left thumb and joint   . CARDIAC CATHETERIZATION    . CARDIAC ELECTROPHYSIOLOGY STUDY AND ABLATION  09/2008   for pvcs, Dr. Loralie Champagne  . CARDIOVERSION N/A 12/18/2016   Procedure: CARDIOVERSION;  Surgeon: Pixie Casino, MD;  Location: [facility] ENDOSCOPY;  Service: Cardiovascular;   Laterality: N/A;  . [place]   right wrist  . CARPAL TUNNEL RELEASE  05/04/2012   Procedure: CARPAL TUNNEL RELEASE;  Surgeon: Wynonia Sours, MD;  Location: [place];  Service: Orthopedics;  Laterality: Left;  . CARPOMETACARPEL SUSPENSION PLASTY Right 11/16/2014   Procedure: SUSPENSIONPLASTY RIGHT THUMB TENDON TRANSFER ABDUCTOR POLLICUS LONGUS EXCISION TRAPEZIUM;  Surgeon: Daryll Brod, MD;  Location: [place];  Service: Orthopedics;  Laterality: Right;  . CHOLECYSTECTOMY  1994  . CIRCUMCISION    . COLONOSCOPY N/A 03/14/2013   Procedure: COLONOSCOPY;  Surgeon: Daneil Dolin, MD;  [facility];  Service: Endoscopy;  Laterality: N/A;  8:15 AM  . EYE SURGERY     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-[facility]  . EYE SURGERY  2012   Left eye Corneal transplant- partial- Cataract  . GALLBLADDER SURGERY  12/01/2006  . HAND TENDON SURGERY Left late 1990's   thumb  . HEMORROIDECTOMY  2003  . left Knee Arthroscopy     [facility]  . PARTIAL KNEE ARTHROPLASTY  11/22/2012   Procedure: UNICOMPARTMENTAL KNEE;  Surgeon: Lorn Junes, MD;  Location: [place];  Service: Orthopedics;  Laterality: Left;  left unicompartmental knee arthroplasty  . PERMANENT PACEMAKER GENERATOR CHANGE N/A 01/12/2013   Procedure: PERMANENT PACEMAKER GENERATOR CHANGE;  Surgeon: Evans Lance, MD;  Location:  [place] CATH LAB;  Service: Cardiovascular;  Laterality: N/A;  . Rotator cuff Surgery  2001   Right shoulder  . TONSILLECTOMY      Current Outpatient Prescriptions  Medication Sig Dispense Refill  . acetaminophen (TYLENOL) 500 MG tablet Take 1,000 mg by mouth 3 (three) times daily.     Marland Kitchen albuterol (PROAIR HFA) 108 (90 Base) MCG/ACT inhaler 2 puffs every 4 hours as needed only  if your can't catch your breath (Patient taking differently: Inhale 2 puffs into the lungs every 4 (four) hours as needed for wheezing or shortness of breath. ) 1 Inhaler 1  .  amLODipine (NORVASC) 10 MG tablet Take 10 mg by mouth daily.      . bisoprolol (ZEBETA) 5 MG tablet TAKE 1 AND 1/2 TABLET BY MOUTH TWICE DAILY. (Patient taking differently: TAKE 1 AND 1/2 (7.5 MG)  TABLET BY MOUTH TWICE DAILY.) 90 tablet 3  . budesonide-formoterol (SYMBICORT) 160-4.5 MCG/ACT inhaler Take 2 puffs first thing in am and then another 2 puffs about 12 hours later. (Patient taking differently: Inhale 2 puffs into the lungs 2 (two) times daily. ) 1 Inhaler 11  . canagliflozin (INVOKANA) 300 MG TABS tablet Take 300 mg by mouth daily before breakfast.    . cloNIDine (CATAPRES) 0.1 MG tablet Take 0.1 mg by mouth 2 (two) times daily.    Marland Kitchen glipiZIDE (GLUCOTROL XL) 5 MG 24 hr tablet Take 5 mg by mouth daily before breakfast.    . levothyroxine (SYNTHROID, LEVOTHROID) 150 MCG tablet Take 150 mcg by mouth daily before breakfast.     . losartan-hydrochlorothiazide (HYZAAR) 100-25 MG per tablet Take 1 tablet by mouth daily.    . metFORMIN (GLUCOPHAGE-XR) 500 MG 24 hr tablet Take 500 mg by mouth at bedtime.    Marland Kitchen omeprazole (PRILOSEC OTC) 20 MG tablet Take 20 mg by mouth daily as needed.    . simvastatin (ZOCOR) 20 MG tablet Take 20 mg by mouth at bedtime.    Alveda Reasons 20 MG TABS tablet TAKE 1 TABLET BY MOUTH DAILY WITH SUPPER 90 tablet 1  . albuterol (ACCUNEB) 0.63 MG/3ML nebulizer solution Take 3 mLs (0.63 mg total) by nebulization every 4 (four) hours as needed for wheezing. (Patient not taking: Reported on 08/18/2017) 75 mL 1  . Multiple Vitamin (MULTIVITAMIN) tablet Take 1 tablet by mouth daily.       No current facility-administered medications for this encounter.     Allergies  Allergen Reactions  . Food Anaphylaxis and Shortness Of Breath    Tree nuts- breathing difficulty, seafood- breathing difficulty  . Iodinated Diagnostic Agents Hives and Shortness Of Breath    Patient states hives to throat closing. (01/15/17: patient states this reaction was "about 20 years ago" with possibly an  IVP.  He now says high doses of prednisone "throw me into AFib."  He has tolerated CT arthrograms with Benadrly 50mg  PO one hour before injection.  Brita Romp, RN)  . Other Hives    Goats, Goat Cheese  . Prednisone Other (See Comments)    "throws me into AFib"  . Clavulanic Acid Diarrhea  . Shellfish Allergy Other (See Comments)    To shellfish, crabs.  Makes him feel like "things are crawling all over" me.  Denies airway issues with these foods.  Brita Romp, RN 01/15/17)  . Voltaren [Diclofenac Sodium] Other (See Comments)    Feels like things are crawling on him    Social History   Social History  . Marital  status: Married    Spouse name: N/A  . Number of children: N/A  . Years of education: N/A   Occupational History  . Not on file.   Social History Main Topics  . Smoking status: Former Smoker    Packs/day: 1.00    Years: 25.00    Types: Cigarettes    Start date: 02/20/1958    Quit date: 02/12/1984  . Smokeless tobacco: Never Used  . Alcohol use 2.4 oz/week    4 Glasses of wine per week  . Drug use: No  . Sexual activity: Not on file   Other Topics Concern  . Not on file   Social History Narrative   Regular exercise: No    Family History  Problem Relation Age of Onset  . Other Father 34       Sudden Cardiac death  . Pancreatic cancer Mother   . Colon cancer Mother   . Colon cancer Maternal Aunt   . Colon polyps Neg Hx     ROS- All systems are reviewed and negative except as per the HPI above  Physical Exam: Vitals:   08/18/17 1327  BP: 112/64  Weight: 239 lb (108.4 kg)  Height: 5\' 10"  (1.778 m)   Wt Readings from Last 3 Encounters:  08/18/17 239 lb (108.4 kg)  08/13/17 238 lb (108 kg)  12/26/16 244 lb (110.7 kg)    Labs: Lab Results  Component Value Date   NA 141 08/13/2017   K 4.9 08/13/2017   CL 104 08/13/2017   CO2 28 08/13/2017   GLUCOSE 132 (H) 08/13/2017   BUN 23 (H) 08/13/2017   CREATININE 1.21 08/13/2017   CALCIUM 9.5 08/13/2017    Lab Results  Component Value Date   INR 0.91 01/10/2013   No results found for: CHOL, HDL, LDLCALC, TRIG   GEN- The patient is well appearing, alert and oriented x 3 today.   Head- normocephalic, atraumatic Eyes-  Sclera clear, conjunctiva pink Ears- hearing intact Oropharynx- clear Neck- supple, no JVP Lymph- no cervical lymphadenopathy Lungs- Clear to ausculation bilaterally, normal work of breathing Heart- Rapid regular rate and rhythm, no murmurs, rubs or gallops, PMI not laterally displaced GI- soft, NT, ND, + BS Extremities- no clubbing, cyanosis, or edema MS- no significant deformity or atrophy Skin- no rash or lesion Psych- euthymic mood, full affect Neuro- strength and sensation are intact  EKG- v paced rhythm at 125 bpm. Epic records reviewed    Assessment and Plan: 1. Wide complex tachycardia, probable atrial flutter I spoke to Dr. Lovena Le as to guidance with rapid rhythm and pending knee surgery and he believes his device can be reprogrammed and this will take care of the rapid rate He asked me to send pt to the device clinic tomorrow for this to take place He will continue xarelto and stop as directed for knee surgery on Friday as previously directed  Butch Penny C. Teona Vargus, [facility] [address] [place], [place] [address] [phone]

## 2017-08-19 ENCOUNTER — Ambulatory Visit (INDEPENDENT_AMBULATORY_CARE_PROVIDER_SITE_OTHER): Payer: Medicare Other | Admitting: *Deleted

## 2017-08-19 DIAGNOSIS — I495 Sick sinus syndrome: Secondary | ICD-10-CM | POA: Diagnosis not present

## 2017-08-19 DIAGNOSIS — Z950 Presence of cardiac pacemaker: Secondary | ICD-10-CM

## 2017-08-19 NOTE — Progress Notes (Signed)
Pt seen d/t tracking of AFlutter. Per Dr. Lovena Le recommendations programmed DDIR at 75bpm. Pt walked around the office, pt had no complaints.

## 2017-08-21 MED ORDER — DEXTROSE 5 % IV SOLN
3.0000 g | INTRAVENOUS | Status: AC
  Filled 2017-08-21: qty 3000

## 2017-08-24 ENCOUNTER — Telehealth (HOSPITAL_COMMUNITY): Payer: Self-pay | Admitting: Physical Therapy

## 2017-08-24 DIAGNOSIS — M1711 Unilateral primary osteoarthritis, right knee: Secondary | ICD-10-CM | POA: Diagnosis not present

## 2017-08-24 DIAGNOSIS — M1712 Unilateral primary osteoarthritis, left knee: Secondary | ICD-10-CM | POA: Diagnosis not present

## 2017-08-24 NOTE — Telephone Encounter (Signed)
Renee with Dr. Noemi Chapel cx this apptment surgery was postpone.

## 2017-08-25 MED ORDER — DEXTROSE 5 % IV SOLN
3.0000 g | INTRAVENOUS | Status: AC
  Administered 2017-08-26: 3 g via INTRAVENOUS
  Filled 2017-08-25: qty 3000

## 2017-08-26 ENCOUNTER — Inpatient Hospital Stay (HOSPITAL_COMMUNITY): Payer: Medicare Other | Admitting: Certified Registered"

## 2017-08-26 ENCOUNTER — Ambulatory Visit (HOSPITAL_COMMUNITY): Payer: Medicare Other | Admitting: Physical Therapy

## 2017-08-26 ENCOUNTER — Telehealth (HOSPITAL_COMMUNITY): Payer: Self-pay | Admitting: Physical Therapy

## 2017-08-26 ENCOUNTER — Inpatient Hospital Stay (HOSPITAL_COMMUNITY)
Admission: RE | Admit: 2017-08-26 | Discharge: 2017-08-28 | DRG: 470 | Disposition: A | Discharge status: Home Health | Payer: Medicare Other | Source: Ambulatory Visit | Attending: Orthopedic Surgery | Admitting: Orthopedic Surgery

## 2017-08-26 ENCOUNTER — Inpatient Hospital Stay (HOSPITAL_COMMUNITY): Payer: Medicare Other | Admitting: Emergency Medicine

## 2017-08-26 ENCOUNTER — Encounter (HOSPITAL_COMMUNITY)
Admission: RE | Disposition: A | Discharge status: Home Health | Payer: Self-pay | Source: Ambulatory Visit | Attending: Orthopedic Surgery

## 2017-08-26 ENCOUNTER — Encounter (HOSPITAL_COMMUNITY): Payer: Self-pay | Admitting: *Deleted

## 2017-08-26 DIAGNOSIS — G4733 Obstructive sleep apnea (adult) (pediatric): Secondary | ICD-10-CM | POA: Diagnosis present

## 2017-08-26 DIAGNOSIS — M25561 Pain in right knee: Secondary | ICD-10-CM | POA: Diagnosis not present

## 2017-08-26 DIAGNOSIS — E1151 Type 2 diabetes mellitus with diabetic peripheral angiopathy without gangrene: Secondary | ICD-10-CM | POA: Diagnosis present

## 2017-08-26 DIAGNOSIS — J45991 Cough variant asthma: Secondary | ICD-10-CM | POA: Diagnosis present

## 2017-08-26 DIAGNOSIS — I4510 Unspecified right bundle-branch block: Secondary | ICD-10-CM | POA: Diagnosis present

## 2017-08-26 DIAGNOSIS — E119 Type 2 diabetes mellitus without complications: Secondary | ICD-10-CM

## 2017-08-26 DIAGNOSIS — M1711 Unilateral primary osteoarthritis, right knee: Secondary | ICD-10-CM | POA: Diagnosis present

## 2017-08-26 DIAGNOSIS — I10 Essential (primary) hypertension: Secondary | ICD-10-CM | POA: Diagnosis not present

## 2017-08-26 DIAGNOSIS — Z7984 Long term (current) use of oral hypoglycemic drugs: Secondary | ICD-10-CM | POA: Diagnosis not present

## 2017-08-26 DIAGNOSIS — Z96652 Presence of left artificial knee joint: Secondary | ICD-10-CM | POA: Diagnosis present

## 2017-08-26 DIAGNOSIS — I6529 Occlusion and stenosis of unspecified carotid artery: Secondary | ICD-10-CM | POA: Diagnosis present

## 2017-08-26 DIAGNOSIS — Z6836 Body mass index (BMI) 36.0-36.9, adult: Secondary | ICD-10-CM | POA: Diagnosis not present

## 2017-08-26 DIAGNOSIS — E782 Mixed hyperlipidemia: Secondary | ICD-10-CM | POA: Diagnosis present

## 2017-08-26 DIAGNOSIS — I493 Ventricular premature depolarization: Secondary | ICD-10-CM | POA: Diagnosis present

## 2017-08-26 DIAGNOSIS — M6588 Other synovitis and tenosynovitis, other site: Secondary | ICD-10-CM | POA: Diagnosis not present

## 2017-08-26 DIAGNOSIS — J441 Chronic obstructive pulmonary disease with (acute) exacerbation: Secondary | ICD-10-CM | POA: Diagnosis present

## 2017-08-26 DIAGNOSIS — I480 Paroxysmal atrial fibrillation: Secondary | ICD-10-CM | POA: Diagnosis present

## 2017-08-26 DIAGNOSIS — Z950 Presence of cardiac pacemaker: Secondary | ICD-10-CM | POA: Diagnosis not present

## 2017-08-26 DIAGNOSIS — M24662 Ankylosis, left knee: Secondary | ICD-10-CM | POA: Diagnosis not present

## 2017-08-26 DIAGNOSIS — K219 Gastro-esophageal reflux disease without esophagitis: Secondary | ICD-10-CM | POA: Diagnosis not present

## 2017-08-26 DIAGNOSIS — Z7951 Long term (current) use of inhaled steroids: Secondary | ICD-10-CM | POA: Diagnosis not present

## 2017-08-26 DIAGNOSIS — I481 Persistent atrial fibrillation: Secondary | ICD-10-CM | POA: Diagnosis present

## 2017-08-26 DIAGNOSIS — E039 Hypothyroidism, unspecified: Secondary | ICD-10-CM | POA: Diagnosis present

## 2017-08-26 DIAGNOSIS — Z87891 Personal history of nicotine dependence: Secondary | ICD-10-CM | POA: Diagnosis not present

## 2017-08-26 DIAGNOSIS — E0590 Thyrotoxicosis, unspecified without thyrotoxic crisis or storm: Secondary | ICD-10-CM | POA: Diagnosis present

## 2017-08-26 DIAGNOSIS — E785 Hyperlipidemia, unspecified: Secondary | ICD-10-CM | POA: Diagnosis not present

## 2017-08-26 DIAGNOSIS — Z7901 Long term (current) use of anticoagulants: Secondary | ICD-10-CM

## 2017-08-26 DIAGNOSIS — J45901 Unspecified asthma with (acute) exacerbation: Secondary | ICD-10-CM | POA: Diagnosis present

## 2017-08-26 DIAGNOSIS — G8918 Other acute postprocedural pain: Secondary | ICD-10-CM | POA: Diagnosis not present

## 2017-08-26 DIAGNOSIS — R55 Syncope and collapse: Secondary | ICD-10-CM | POA: Diagnosis present

## 2017-08-26 DIAGNOSIS — I079 Rheumatic tricuspid valve disease, unspecified: Secondary | ICD-10-CM | POA: Diagnosis present

## 2017-08-26 DIAGNOSIS — I359 Nonrheumatic aortic valve disorder, unspecified: Secondary | ICD-10-CM | POA: Diagnosis present

## 2017-08-26 DIAGNOSIS — I4819 Other persistent atrial fibrillation: Secondary | ICD-10-CM | POA: Diagnosis present

## 2017-08-26 HISTORY — PX: TOTAL KNEE ARTHROPLASTY: SHX125

## 2017-08-26 HISTORY — DX: Unilateral primary osteoarthritis, right knee: M17.11

## 2017-08-26 HISTORY — PX: INJECTION KNEE: SHX2446

## 2017-08-26 LAB — GLUCOSE, CAPILLARY
Glucose-Capillary: 137 mg/dL — ABNORMAL HIGH (ref 65–99)
Glucose-Capillary: 148 mg/dL — ABNORMAL HIGH (ref 65–99)
Glucose-Capillary: 136 mg/dL — ABNORMAL HIGH (ref 65–99)
Glucose-Capillary: 214 mg/dL — ABNORMAL HIGH (ref 65–99)

## 2017-08-26 SURGERY — ARTHROPLASTY, KNEE, TOTAL
Anesthesia: Monitor Anesthesia Care | Site: Knee | Laterality: Right

## 2017-08-26 MED ORDER — PHENOL 1.4 % MT LIQD: 1.0000 | OROMUCOSAL | Status: DC | PRN

## 2017-08-26 MED ORDER — ALBUTEROL SULFATE 0.63 MG/3ML IN NEBU: 1.0000 | INHALATION_SOLUTION | RESPIRATORY_TRACT | Status: DC | PRN

## 2017-08-26 MED ORDER — METOCLOPRAMIDE HCL 5 MG/ML IJ SOLN
5.0000 mg | Freq: Three times a day (TID) | INTRAMUSCULAR | Status: DC | PRN
  Administered 2017-08-27: 10 mg via INTRAVENOUS
  Filled 2017-08-26: qty 2

## 2017-08-26 MED ORDER — SODIUM CHLORIDE 0.9 % IR SOLN
Status: DC | PRN
  Administered 2017-08-26: 3000 mL

## 2017-08-26 MED ORDER — BUPIVACAINE-EPINEPHRINE (PF) 0.25% -1:200000 IJ SOLN
INTRAMUSCULAR | Status: AC
  Filled 2017-08-26: qty 30

## 2017-08-26 MED ORDER — METOCLOPRAMIDE HCL 5 MG PO TABS: 5.0000 mg | ORAL_TABLET | Freq: Three times a day (TID) | ORAL | Status: DC | PRN

## 2017-08-26 MED ORDER — MIDAZOLAM HCL 2 MG/2ML IJ SOLN
2.0000 mg | Freq: Once | INTRAMUSCULAR | Status: AC
  Administered 2017-08-26: 2 mg via INTRAVENOUS

## 2017-08-26 MED ORDER — DEXAMETHASONE SODIUM PHOSPHATE 10 MG/ML IJ SOLN
10.0000 mg | Freq: Three times a day (TID) | INTRAMUSCULAR | Status: DC
  Administered 2017-08-27: 10 mg via INTRAVENOUS
  Filled 2017-08-26 (×2): qty 1

## 2017-08-26 MED ORDER — INSULIN ASPART 100 UNIT/ML ~~LOC~~ SOLN
0.0000 [IU] | Freq: Three times a day (TID) | SUBCUTANEOUS | Status: DC
  Administered 2017-08-27 – 2017-08-28 (×4): 3 [IU] via SUBCUTANEOUS

## 2017-08-26 MED ORDER — INSULIN ASPART 100 UNIT/ML ~~LOC~~ SOLN: 0.0000 [IU] | Freq: Every day | SUBCUTANEOUS | Status: DC

## 2017-08-26 MED ORDER — ONDANSETRON HCL 4 MG PO TABS
4.0000 mg | ORAL_TABLET | Freq: Four times a day (QID) | ORAL | Status: DC | PRN
  Administered 2017-08-27 (×2): 4 mg via ORAL
  Filled 2017-08-26 (×2): qty 1

## 2017-08-26 MED ORDER — FENTANYL CITRATE (PF) 100 MCG/2ML IJ SOLN
INTRAMUSCULAR | Status: AC
  Filled 2017-08-26: qty 2

## 2017-08-26 MED ORDER — LACTATED RINGERS IV SOLN
INTRAVENOUS | Status: DC
  Administered 2017-08-26: 11:00:00 via INTRAVENOUS

## 2017-08-26 MED ORDER — 0.9 % SODIUM CHLORIDE (POUR BTL) OPTIME
TOPICAL | Status: DC | PRN
Start: 2017-08-26 — End: 2017-08-26
  Administered 2017-08-26: 1000 mL

## 2017-08-26 MED ORDER — BUPIVACAINE-EPINEPHRINE (PF) 0.5% -1:200000 IJ SOLN
INTRAMUSCULAR | Status: DC | PRN
  Administered 2017-08-26 (×2): 5 mL via PERINEURAL
  Administered 2017-08-26 (×2): 10 mL via PERINEURAL

## 2017-08-26 MED ORDER — DIPHENHYDRAMINE HCL 12.5 MG/5ML PO ELIX: 12.5000 mg | ORAL_SOLUTION | ORAL | Status: DC | PRN

## 2017-08-26 MED ORDER — ONDANSETRON HCL 4 MG/2ML IJ SOLN
4.0000 mg | Freq: Four times a day (QID) | INTRAMUSCULAR | Status: DC | PRN
  Administered 2017-08-27: 4 mg via INTRAVENOUS
  Filled 2017-08-26 (×2): qty 2

## 2017-08-26 MED ORDER — DOCUSATE SODIUM 100 MG PO CAPS
100.0000 mg | ORAL_CAPSULE | Freq: Two times a day (BID) | ORAL | Status: DC
  Administered 2017-08-26 – 2017-08-28 (×4): 100 mg via ORAL
  Filled 2017-08-26 (×4): qty 1

## 2017-08-26 MED ORDER — MIDAZOLAM HCL 2 MG/2ML IJ SOLN
INTRAMUSCULAR | Status: AC
  Administered 2017-08-26: 2 mg via INTRAVENOUS
  Filled 2017-08-26: qty 2

## 2017-08-26 MED ORDER — PROPOFOL 10 MG/ML IV BOLUS
INTRAVENOUS | Status: DC | PRN
  Administered 2017-08-26 (×2): 20 mg via INTRAVENOUS

## 2017-08-26 MED ORDER — ALUM & MAG HYDROXIDE-SIMETH 200-200-20 MG/5ML PO SUSP
30.0000 mL | ORAL | Status: DC | PRN
  Administered 2017-08-27: 30 mL via ORAL
  Filled 2017-08-26: qty 30

## 2017-08-26 MED ORDER — ACETAMINOPHEN 500 MG PO TABS
1000.0000 mg | ORAL_TABLET | Freq: Four times a day (QID) | ORAL | Status: AC
  Administered 2017-08-26 – 2017-08-27 (×3): 1000 mg via ORAL
  Filled 2017-08-26 (×4): qty 2

## 2017-08-26 MED ORDER — CLONIDINE HCL 0.1 MG PO TABS
0.1000 mg | ORAL_TABLET | Freq: Two times a day (BID) | ORAL | Status: DC
  Administered 2017-08-26 – 2017-08-27 (×3): 0.1 mg via ORAL
  Filled 2017-08-26 (×4): qty 1

## 2017-08-26 MED ORDER — OMEPRAZOLE MAGNESIUM 20 MG PO TBEC: 20.0000 mg | DELAYED_RELEASE_TABLET | Freq: Every day | ORAL | Status: DC

## 2017-08-26 MED ORDER — PROMETHAZINE HCL 25 MG/ML IJ SOLN: 6.2500 mg | INTRAMUSCULAR | Status: DC | PRN

## 2017-08-26 MED ORDER — RIVAROXABAN 10 MG PO TABS
10.0000 mg | ORAL_TABLET | Freq: Every day | ORAL | Status: DC
  Administered 2017-08-27 – 2017-08-28 (×2): 10 mg via ORAL
  Filled 2017-08-26 (×2): qty 1

## 2017-08-26 MED ORDER — BUPIVACAINE HCL (PF) 0.5 % IJ SOLN
INTRAMUSCULAR | Status: AC
  Filled 2017-08-26: qty 10

## 2017-08-26 MED ORDER — INSULIN GLARGINE 100 UNIT/ML ~~LOC~~ SOLN
30.0000 [IU] | Freq: Every day | SUBCUTANEOUS | Status: DC
  Administered 2017-08-26 – 2017-08-27 (×2): 30 [IU] via SUBCUTANEOUS
  Filled 2017-08-26 (×2): qty 0.3

## 2017-08-26 MED ORDER — BUPIVACAINE-EPINEPHRINE 0.25% -1:200000 IJ SOLN
INTRAMUSCULAR | Status: DC | PRN
  Administered 2017-08-26: 27 mL
  Administered 2017-08-26: 3 mL

## 2017-08-26 MED ORDER — ALBUTEROL SULFATE (2.5 MG/3ML) 0.083% IN NEBU: 2.5000 mg | INHALATION_SOLUTION | RESPIRATORY_TRACT | Status: DC | PRN

## 2017-08-26 MED ORDER — MOMETASONE FURO-FORMOTEROL FUM 200-5 MCG/ACT IN AERO
2.0000 | INHALATION_SPRAY | Freq: Two times a day (BID) | RESPIRATORY_TRACT | Status: DC
  Administered 2017-08-26 – 2017-08-28 (×4): 2 via RESPIRATORY_TRACT
  Filled 2017-08-26: qty 8.8

## 2017-08-26 MED ORDER — BUPIVACAINE IN DEXTROSE 0.75-8.25 % IT SOLN
INTRATHECAL | Status: DC | PRN
  Administered 2017-08-26: 2 mL via INTRATHECAL

## 2017-08-26 MED ORDER — METHYLPREDNISOLONE ACETATE 80 MG/ML IJ SUSP
INTRAMUSCULAR | Status: DC | PRN
  Administered 2017-08-26: 80 mg

## 2017-08-26 MED ORDER — POVIDONE-IODINE 7.5 % EX SOLN: Freq: Once | CUTANEOUS | Status: DC

## 2017-08-26 MED ORDER — AMLODIPINE BESYLATE 10 MG PO TABS
10.0000 mg | ORAL_TABLET | Freq: Every day | ORAL | Status: DC
  Filled 2017-08-26: qty 1

## 2017-08-26 MED ORDER — POTASSIUM CHLORIDE IN NACL 20-0.9 MEQ/L-% IV SOLN: INTRAVENOUS | Status: DC

## 2017-08-26 MED ORDER — OXYCODONE HCL 5 MG PO TABS
5.0000 mg | ORAL_TABLET | ORAL | Status: DC | PRN
  Administered 2017-08-26 – 2017-08-28 (×12): 10 mg via ORAL
  Filled 2017-08-26 (×13): qty 2

## 2017-08-26 MED ORDER — PHENYLEPHRINE HCL 10 MG/ML IJ SOLN
INTRAVENOUS | Status: DC | PRN
  Administered 2017-08-26: 20 ug/min via INTRAVENOUS

## 2017-08-26 MED ORDER — MIDAZOLAM HCL 5 MG/5ML IJ SOLN
INTRAMUSCULAR | Status: DC | PRN
  Administered 2017-08-26: 2 mg via INTRAVENOUS

## 2017-08-26 MED ORDER — FENTANYL CITRATE (PF) 100 MCG/2ML IJ SOLN
INTRAMUSCULAR | Status: AC
  Administered 2017-08-26: 100 ug via INTRAVENOUS
  Filled 2017-08-26: qty 2

## 2017-08-26 MED ORDER — BISOPROLOL FUMARATE 5 MG PO TABS
7.5000 mg | ORAL_TABLET | Freq: Two times a day (BID) | ORAL | Status: DC
  Administered 2017-08-26 – 2017-08-27 (×3): 7.5 mg via ORAL
  Filled 2017-08-26 (×5): qty 1.5

## 2017-08-26 MED ORDER — LACTATED RINGERS IV SOLN
INTRAVENOUS | Status: DC | PRN
  Administered 2017-08-26 (×2): via INTRAVENOUS

## 2017-08-26 MED ORDER — DEXAMETHASONE SODIUM PHOSPHATE 10 MG/ML IJ SOLN
INTRAMUSCULAR | Status: DC | PRN
  Administered 2017-08-26: 10 mg via INTRAVENOUS

## 2017-08-26 MED ORDER — PROPOFOL 500 MG/50ML IV EMUL
INTRAVENOUS | Status: DC | PRN
  Administered 2017-08-26: 75 ug/kg/min via INTRAVENOUS

## 2017-08-26 MED ORDER — FENTANYL CITRATE (PF) 100 MCG/2ML IJ SOLN
INTRAMUSCULAR | Status: DC | PRN
  Administered 2017-08-26: 25 ug via INTRAVENOUS

## 2017-08-26 MED ORDER — MENTHOL 3 MG MT LOZG
1.0000 | LOZENGE | OROMUCOSAL | Status: DC | PRN
Start: 2017-08-26 — End: 2017-08-28

## 2017-08-26 MED ORDER — HYDROMORPHONE HCL 1 MG/ML IJ SOLN
INTRAMUSCULAR | Status: AC
  Administered 2017-08-26: 0.5 mg via INTRAVENOUS
  Filled 2017-08-26: qty 1

## 2017-08-26 MED ORDER — FENTANYL CITRATE (PF) 100 MCG/2ML IJ SOLN
100.0000 ug | Freq: Once | INTRAMUSCULAR | Status: AC
  Administered 2017-08-26: 100 ug via INTRAVENOUS

## 2017-08-26 MED ORDER — CHLORHEXIDINE GLUCONATE 4 % EX LIQD: 60.0000 mL | Freq: Once | CUTANEOUS | Status: DC

## 2017-08-26 MED ORDER — ACETAMINOPHEN 325 MG PO TABS
650.0000 mg | ORAL_TABLET | Freq: Four times a day (QID) | ORAL | Status: DC | PRN
  Administered 2017-08-27 – 2017-08-28 (×2): 650 mg via ORAL
  Filled 2017-08-26 (×2): qty 2

## 2017-08-26 MED ORDER — SIMVASTATIN 20 MG PO TABS
20.0000 mg | ORAL_TABLET | Freq: Every day | ORAL | Status: DC
  Administered 2017-08-27: 20 mg via ORAL
  Filled 2017-08-26 (×3): qty 1

## 2017-08-26 MED ORDER — LEVOTHYROXINE SODIUM 75 MCG PO TABS
150.0000 ug | ORAL_TABLET | Freq: Every day | ORAL | Status: DC
  Administered 2017-08-27 – 2017-08-28 (×2): 150 ug via ORAL
  Filled 2017-08-26 (×2): qty 2

## 2017-08-26 MED ORDER — ALBUTEROL SULFATE HFA 108 (90 BASE) MCG/ACT IN AERS: 2.0000 | INHALATION_SPRAY | RESPIRATORY_TRACT | Status: DC | PRN

## 2017-08-26 MED ORDER — POLYETHYLENE GLYCOL 3350 17 G PO PACK
17.0000 g | PACK | Freq: Two times a day (BID) | ORAL | Status: DC
  Administered 2017-08-27 – 2017-08-28 (×2): 17 g via ORAL
  Filled 2017-08-26 (×4): qty 1

## 2017-08-26 MED ORDER — INSULIN ASPART 100 UNIT/ML ~~LOC~~ SOLN
4.0000 [IU] | Freq: Three times a day (TID) | SUBCUTANEOUS | Status: DC
  Administered 2017-08-27 – 2017-08-28 (×5): 4 [IU] via SUBCUTANEOUS

## 2017-08-26 MED ORDER — PANTOPRAZOLE SODIUM 40 MG PO TBEC
40.0000 mg | DELAYED_RELEASE_TABLET | Freq: Every day | ORAL | Status: DC
  Administered 2017-08-27 – 2017-08-28 (×2): 40 mg via ORAL
  Filled 2017-08-26 (×2): qty 1

## 2017-08-26 MED ORDER — METHYLPREDNISOLONE ACETATE 80 MG/ML IJ SUSP
INTRAMUSCULAR | Status: AC
  Filled 2017-08-26: qty 1

## 2017-08-26 MED ORDER — LACTATED RINGERS IV SOLN: INTRAVENOUS | Status: DC

## 2017-08-26 MED ORDER — MIDAZOLAM HCL 2 MG/2ML IJ SOLN
INTRAMUSCULAR | Status: AC
  Filled 2017-08-26: qty 2

## 2017-08-26 MED ORDER — HYDROMORPHONE HCL 1 MG/ML IJ SOLN
0.2500 mg | INTRAMUSCULAR | Status: DC | PRN
  Administered 2017-08-26 (×2): 0.5 mg via INTRAVENOUS

## 2017-08-26 MED ORDER — CEFAZOLIN SODIUM-DEXTROSE 2-4 GM/100ML-% IV SOLN
2.0000 g | Freq: Four times a day (QID) | INTRAVENOUS | Status: AC
  Administered 2017-08-26: 2 g via INTRAVENOUS
  Filled 2017-08-26 (×2): qty 100

## 2017-08-26 MED ORDER — HYDROMORPHONE HCL 1 MG/ML IJ SOLN
1.0000 mg | INTRAMUSCULAR | Status: DC | PRN
  Administered 2017-08-26 – 2017-08-27 (×2): 1 mg via INTRAVENOUS
  Filled 2017-08-26 (×4): qty 1

## 2017-08-26 MED ORDER — ACETAMINOPHEN 650 MG RE SUPP: 650.0000 mg | Freq: Four times a day (QID) | RECTAL | Status: DC | PRN

## 2017-08-26 SURGICAL SUPPLY — 92 items
BANDAGE ACE 6X5 VEL STRL LF (GAUZE/BANDAGES/DRESSINGS) IMPLANT
BANDAGE ESMARK 6X9 LF (GAUZE/BANDAGES/DRESSINGS) ×3 IMPLANT
BENZOIN TINCTURE PRP APPL 2/3 (GAUZE/BANDAGES/DRESSINGS) ×5 IMPLANT
BLADE CUDA 5.5 (BLADE) IMPLANT
BLADE CUTTER GATOR 3.5 (BLADE) IMPLANT
BLADE GREAT WHITE 4.2 (BLADE) IMPLANT
BLADE GREAT WHITE 4.2MM (BLADE)
BLADE SAGITTAL 25.0X1.19X90 (BLADE) ×4 IMPLANT
BLADE SAGITTAL 25.0X1.19X90MM (BLADE) ×1
BLADE SAW SGTL 13X75X1.27 (BLADE) ×5 IMPLANT
BLADE SURG 10 STRL SS (BLADE) ×10 IMPLANT
BLADE SURG 11 STRL SS (BLADE) IMPLANT
BNDG ELASTIC 6X15 VLCR STRL LF (GAUZE/BANDAGES/DRESSINGS) ×5 IMPLANT
BNDG ESMARK 6X9 LF (GAUZE/BANDAGES/DRESSINGS) ×5
BOWL SMART MIX CTS (DISPOSABLE) ×5 IMPLANT
BUR OVAL 6.0 (BURR) IMPLANT
CAPT KNEE TOTAL 3 ATTUNE ×5 IMPLANT
CEMENT HV SMART SET (Cement) ×10 IMPLANT
CHLORAPREP W/TINT 26ML (MISCELLANEOUS) ×10 IMPLANT
CLOSURE WOUND 1/2 X4 (GAUZE/BANDAGES/DRESSINGS) ×1
COVER SURGICAL LIGHT HANDLE (MISCELLANEOUS) ×5 IMPLANT
CUFF TOURNIQUET SINGLE 34IN LL (TOURNIQUET CUFF) ×5 IMPLANT
CUFF TOURNIQUET SINGLE 44IN (TOURNIQUET CUFF) IMPLANT
DRAPE ARTHROSCOPY W/POUCH 114 (DRAPES) IMPLANT
DRAPE EXTREMITY T 121X128X90 (DRAPE) ×5 IMPLANT
DRAPE HALF SHEET 40X57 (DRAPES) ×10 IMPLANT
DRAPE U-SHAPE 47X51 STRL (DRAPES) ×5 IMPLANT
DRSG AQUACEL AG ADV 3.5X10 (GAUZE/BANDAGES/DRESSINGS) IMPLANT
DRSG AQUACEL AG ADV 3.5X14 (GAUZE/BANDAGES/DRESSINGS) ×5 IMPLANT
DURAPREP 26ML APPLICATOR (WOUND CARE) IMPLANT
ELECT CAUTERY BLADE 6.4 (BLADE) ×5 IMPLANT
ELECT MENISCUS 165MM 90D (ELECTRODE) IMPLANT
ELECT REM PT RETURN 9FT ADLT (ELECTROSURGICAL) ×5
ELECTRODE REM PT RTRN 9FT ADLT (ELECTROSURGICAL) ×3 IMPLANT
FACESHIELD WRAPAROUND (MASK) ×10 IMPLANT
FILTER STRAW FLUID ASPIR (MISCELLANEOUS) IMPLANT
GAUZE SPONGE 4X4 12PLY STRL (GAUZE/BANDAGES/DRESSINGS) ×5 IMPLANT
GAUZE XEROFORM 1X8 LF (GAUZE/BANDAGES/DRESSINGS) IMPLANT
GLOVE BIO SURGEON STRL SZ7 (GLOVE) ×5 IMPLANT
GLOVE BIOGEL PI IND STRL 7.0 (GLOVE) ×3 IMPLANT
GLOVE BIOGEL PI IND STRL 7.5 (GLOVE) ×3 IMPLANT
GLOVE BIOGEL PI INDICATOR 7.0 (GLOVE) ×2
GLOVE BIOGEL PI INDICATOR 7.5 (GLOVE) ×2
GLOVE SS BIOGEL STRL SZ 7.5 (GLOVE) ×3 IMPLANT
GLOVE SUPERSENSE BIOGEL SZ 7.5 (GLOVE) ×2
GOWN STRL REUS W/ TWL LRG LVL3 (GOWN DISPOSABLE) ×9 IMPLANT
GOWN STRL REUS W/ TWL XL LVL3 (GOWN DISPOSABLE) ×6 IMPLANT
GOWN STRL REUS W/TWL LRG LVL3 (GOWN DISPOSABLE) ×6
GOWN STRL REUS W/TWL XL LVL3 (GOWN DISPOSABLE) ×4
HANDPIECE INTERPULSE COAX TIP (DISPOSABLE) ×2
HOOD PEEL AWAY FACE SHEILD DIS (HOOD) ×10 IMPLANT
IMMOBILIZER KNEE 22 UNIV (SOFTGOODS) ×5 IMPLANT
KIT BASIN OR (CUSTOM PROCEDURE TRAY) ×5 IMPLANT
KIT ROOM TURNOVER OR (KITS) ×5 IMPLANT
MANIFOLD NEPTUNE II (INSTRUMENTS) ×5 IMPLANT
MARKER SKIN DUAL TIP RULER LAB (MISCELLANEOUS) ×5 IMPLANT
NEEDLE 18GX1X1/2 (RX/OR ONLY) (NEEDLE) ×10 IMPLANT
NEEDLE SPNL 18GX3.5 QUINCKE PK (NEEDLE) IMPLANT
NS IRRIG 1000ML POUR BTL (IV SOLUTION) ×5 IMPLANT
PACK ARTHROSCOPY DSU (CUSTOM PROCEDURE TRAY) IMPLANT
PACK TOTAL JOINT (CUSTOM PROCEDURE TRAY) ×5 IMPLANT
PAD ARMBOARD 7.5X6 YLW CONV (MISCELLANEOUS) ×10 IMPLANT
PENCIL BUTTON HOLSTER BLD 10FT (ELECTRODE) IMPLANT
SET ARTHROSCOPY TUBING (MISCELLANEOUS)
SET ARTHROSCOPY TUBING LN (MISCELLANEOUS) IMPLANT
SET HNDPC FAN SPRY TIP SCT (DISPOSABLE) ×3 IMPLANT
SPONGE LAP 4X18 X RAY DECT (DISPOSABLE) IMPLANT
STRIP CLOSURE SKIN 1/2X4 (GAUZE/BANDAGES/DRESSINGS) ×4 IMPLANT
SUCTION FRAZIER HANDLE 10FR (MISCELLANEOUS) ×2
SUCTION TUBE FRAZIER 10FR DISP (MISCELLANEOUS) ×3 IMPLANT
SUT ETHIBOND NAB CT1 #1 30IN (SUTURE) IMPLANT
SUT ETHILON 4 0 PS 2 18 (SUTURE) IMPLANT
SUT MNCRL AB 3-0 PS2 18 (SUTURE) ×5 IMPLANT
SUT VIC AB 0 CT1 27 (SUTURE) ×4
SUT VIC AB 0 CT1 27XBRD ANBCTR (SUTURE) ×6 IMPLANT
SUT VIC AB 1 CT1 27 (SUTURE) ×2
SUT VIC AB 1 CT1 27XBRD ANBCTR (SUTURE) ×3 IMPLANT
SUT VIC AB 2-0 CT1 27 (SUTURE) ×4
SUT VIC AB 2-0 CT1 TAPERPNT 27 (SUTURE) ×6 IMPLANT
SYR 30ML LL (SYRINGE) ×5 IMPLANT
SYR 30ML SLIP (SYRINGE) ×5 IMPLANT
SYR 5ML LL (SYRINGE) ×5 IMPLANT
TOWEL OR 17X24 6PK STRL BLUE (TOWEL DISPOSABLE) IMPLANT
TOWEL OR 17X26 10 PK STRL BLUE (TOWEL DISPOSABLE) IMPLANT
TRAY FOLEY CATH 14FRSI W/METER (CATHETERS) IMPLANT
TRAY FOLEY CATH SILVER 14FR (SET/KITS/TRAYS/PACK) ×5 IMPLANT
TUBE CONNECTING 12'X1/4 (SUCTIONS) ×1
TUBE CONNECTING 12X1/4 (SUCTIONS) ×4 IMPLANT
WAND HAND CNTRL MULTIVAC 90 (MISCELLANEOUS) IMPLANT
WATER STERILE IRR 1000ML POUR (IV SOLUTION) IMPLANT
WRAP KNEE MAXI GEL POST OP (GAUZE/BANDAGES/DRESSINGS) IMPLANT
YANKAUER SUCT BULB TIP NO VENT (SUCTIONS) ×5 IMPLANT

## 2017-08-26 NOTE — Anesthesia Postprocedure Evaluation (Signed)
Anesthesia Post Note  Patient: Clarence Dawson  Procedure(s) Performed: Procedure(s) (LRB): TOTAL KNEE ARTHROPLASTY (Right) LEFT KNEE INJECTION (Left)     Patient location during evaluation: PACU Anesthesia Type: MAC Level of consciousness: awake and alert Pain management: pain level controlled Vital Signs Assessment: post-procedure vital signs reviewed and stable Respiratory status: spontaneous breathing, nonlabored ventilation, respiratory function stable and patient connected to nasal cannula oxygen Cardiovascular status: stable and blood pressure returned to baseline Anesthetic complications: no    Last Vitals:  Vitals:   08/26/17 1200 08/26/17 1640  BP: 117/60 125/75  Pulse: 75   Resp: 15   Temp:  36.5 C  SpO2: 95%     Last Pain:  Vitals:   08/26/17 1037  TempSrc: Oral                 Loc Feinstein

## 2017-08-26 NOTE — Anesthesia Procedure Notes (Signed)
Date/Time: 08/26/2017 2:20 PM Performed by: Elige Ko F Ventilation: Nasal airway inserted- appropriate to patient size

## 2017-08-26 NOTE — Transfer of Care (Signed)
Immediate Anesthesia Transfer of Care Note  Patient: Clarence Dawson  Procedure(s) Performed: Procedure(s): TOTAL KNEE ARTHROPLASTY (Right) LEFT KNEE INJECTION (Left)  Patient Location: PACU  Anesthesia Type:Spinal  Level of Consciousness: awake, alert  and oriented  Airway & Oxygen Therapy: Patient Spontanous Breathing  Post-op Assessment: Report given to RN and Post -op Vital signs reviewed and stable  Post vital signs: Reviewed and stable  Last Vitals:  Vitals:   08/26/17 1155 08/26/17 1200  BP: 108/76 117/60  Pulse: 76 75  Resp: 18 15  Temp:    SpO2: 94% 95%    Last Pain:  Vitals:   08/26/17 1037  TempSrc: Oral      Patients Stated Pain Goal: 2 (76/72/09 4709)  Complications: No apparent anesthesia complications

## 2017-08-26 NOTE — Anesthesia Procedure Notes (Addendum)
Anesthesia Regional Block: Adductor canal block   Pre-Anesthetic Checklist: ,, timeout performed, Correct Patient, Correct Site, Correct Laterality, Correct Procedure, Correct Position, site marked, Risks and benefits discussed,  Surgical consent,  Pre-op evaluation,  At surgeon's request and post-op pain management  Laterality: Right and Upper  Prep: chloraprep       Needles:  Injection technique: Single-shot  Needle Type: Echogenic Stimulator Needle     Needle Length: 9cm  Needle Gauge: 21   Needle insertion depth: 4 cm   Additional Needles:   Procedures: ultrasound guided,,,,,,,,  Narrative:  Start time: 08/26/2017 11:41 AM End time: 08/26/2017 11:51 AM Injection made incrementally with aspirations every 5 mL.  Performed by: Personally  Anesthesiologist: Lyn Hollingshead

## 2017-08-26 NOTE — Interval H&P Note (Signed)
History and Physical Interval Note:  08/26/2017 11:25 AM  Clarence Dawson  has presented today for surgery, with the diagnosis of DJD RIGHT KNEE  The various methods of treatment have been discussed with the patient and family. After consideration of risks, benefits and other options for treatment, the patient has consented to  Procedure(s): UNICOMPARTMENTAL KNEE (Right) ARTHROSCOPY KNEE (Right) as a surgical intervention .  The patient's history has been reviewed, patient examined, no change in status, stable for surgery.  I have reviewed the patient's chart and labs.  Questions were answered to the patient's satisfaction.     [name]

## 2017-08-26 NOTE — Anesthesia Preprocedure Evaluation (Addendum)
Anesthesia Evaluation  Patient identified by MRN, date of birth, ID band Patient awake    Reviewed: Allergy & Precautions, NPO status , Patient's Chart, lab work & pertinent test results  Airway Mallampati: II  TM Distance: >3 FB Neck ROM: Full    Dental  (+) Upper Dentures, Lower Dentures, Dental Advidsory Given   Pulmonary asthma , sleep apnea , former smoker,    breath sounds clear to auscultation       Cardiovascular hypertension, Pt. on medications + Peripheral Vascular Disease  Normal cardiovascular exam+ dysrhythmias Atrial Fibrillation + pacemaker   Left ventricle: The cavity size was normal. Wall thickness was   increased in a pattern of mild LVH. Systolic function was normal.   The estimated ejection fraction was in the range of 60% to 65%.   Wall motion was normal; there were no regional wall motion   abnormalities. Features are consistent with a pseudonormal left   ventricular filling pattern, with concomitant abnormal relaxation   and increased filling pressure (grade 2 diastolic dysfunction). - Aortic valve: Mildly to moderately calcified annulus. Moderately   calcified leaflets. There was mild to moderate stenosis. Valve   area (VTI): 1.3 cm^2.   Neuro/Psych negative neurological ROS  negative psych ROS   GI/Hepatic Neg liver ROS, hiatal hernia, GERD  ,  Endo/Other  diabetes, Type 2Hypothyroidism   Renal/GU negative Renal ROS     Musculoskeletal  (+) Arthritis ,   Abdominal   Peds  Hematology negative hematology ROS (+)   Anesthesia Other Findings   Reproductive/Obstetrics                            Anesthesia Physical  Anesthesia Plan  ASA: III  Anesthesia Plan: MAC and Spinal   Post-op Pain Management:  Regional for Post-op pain   Induction: Intravenous  PONV Risk Score and Plan: 2 and Ondansetron, Dexamethasone and Propofol infusion  Airway Management Planned:  Natural Airway and Simple Face Mask  Additional Equipment: None  Intra-op Plan:   Post-operative Plan:   Informed Consent: I have reviewed the patients History and Physical, chart, labs and discussed the procedure including the risks, benefits and alternatives for the proposed anesthesia with the patient or authorized representative who has indicated his/her understanding and acceptance.   Dental advisory given and Dental Advisory Given  Plan Discussed with: CRNA, Surgeon and Anesthesiologist  Anesthesia Plan Comments:       Anesthesia Quick Evaluation

## 2017-08-26 NOTE — Op Note (Signed)
MRN:     [id number] DOB/AGE:    [birth year] / 71 y.o.       OPERATIVE REPORT    DATE OF PROCEDURE:  08/26/2017       PREOPERATIVE DIAGNOSIS:   PRIMARY LOCALIZED OA RIGHT KNEE,  LEFT KNEE CHRONIC NONTRAUMATIC SYNOVITIS      Estimated body mass index is 34.29 kg/m as calculated from the following:   Height as of 08/18/17: 5\' 10"  (1.778 m).   Weight as of this encounter: 108.4 kg (239 lb).                                                        POSTOPERATIVE DIAGNOSIS:   SAME                                                                PROCEDURE:  Procedure(s): TOTAL KNEE ARTHROPLASTY RIGHT KNEE Using Depuy Attune RP implants #5 Femur, #6Tibia, 59mm  RP bearing, 35 Patella LEFT KNEE INJECTION     SURGEON: Dereck Agerton A    ASSISTANT:  Kirstin Shepperson PA-C   (Present and scrubbed throughout the case, critical for assistance with exposure, retraction, instrumentation, and closure.)         ANESTHESIA: Spinal with Adductor Nerve Block     TOURNIQUET TIME: 02OVZ   COMPLICATIONS:  None     SPECIMENS: None   INDICATIONS FOR PROCEDURE: The patient has  DJD RIGHT KNEE, varus deformities, XR shows bone on bone arthritis. Patient has failed all conservative measures including anti-inflammatory medicines, narcotics, attempts at  exercise and weight loss, cortisone injections and viscosupplementation.  Risks and benefits of surgery have been discussed, questions answered.   DESCRIPTION OF PROCEDURE: The patient identified by armband, received  right femoral nerve block and IV antibiotics, in the holding area at [facility]. Patient taken to the operating room, appropriate anesthetic  monitors were attached Spinal anesthesia induced with  the patient in supine position, Foley catheter was inserted. The left knee was examined and had 0-130 ROM and was injected under sterile conditions with 80mg  depomedrol and marcaine.  Tourniquet  applied high to the operative thigh. Lateral post and  foot positioner  applied to the table, the lower extremity was then prepped and draped  in usual sterile fashion from the ankle to the tourniquet. Time-out procedure was performed. The limb was wrapped with an Esmarch bandage and the tourniquet inflated to 365 mmHg. We began the operation by making the anterior midline incision starting at handbreadth above the patella going over the patella 1 cm medial to and  4 cm distal to the tibial tubercle. Small bleeders in the skin and the  subcutaneous tissue identified and cauterized. Transverse retinaculum was incised and reflected medially and a medial parapatellar arthrotomy was accomplished. the patella was everted and theprepatellar fat pad resected. The superficial medial collateral  ligament was then elevated from anterior to posterior along the proximal  flare of the tibia and anterior half of the menisci resected. The knee was hyperflexed exposing bone on bone arthritis. Peripheral and notch osteophytes as well as the cruciate ligaments  were then resected. We continued to  work our way around posteriorly along the proximal tibia, and externally  rotated the tibia subluxing it out from underneath the femur. A McHale  retractor was placed through the notch and a lateral Hohmann retractor  placed, and we then drilled through the proximal tibia in line with the  axis of the tibia followed by an intramedullary guide rod and 2-degree  posterior slope cutting guide. The tibial cutting guide was pinned into place  allowing resection of 4 mm of bone medially and about 6 mm of bone  laterally because of her varus deformity. Satisfied with the tibial resection, we then  entered the distal femur 2 mm anterior to the PCL origin with the  intramedullary guide rod and applied the distal femoral cutting guide  set at 53mm, with 5 degrees of valgus. This was pinned along the  epicondylar axis. At this point, the distal femoral cut was accomplished without  difficulty. We then sized for a #5 femoral component and pinned the guide in 3 degrees of external rotation.The chamfer cutting guide was pinned into place. The anterior, posterior, and chamfer cuts were accomplished without difficulty followed by  the  RP box cutting guide and the box cut. We also removed posterior osteophytes from the posterior femoral condyles. At this  time, the knee was brought into full extension. We checked our  extension and flexion gaps and found them symmetric at 41mm.  The patella thickness measured at 25 mm. We set the cutting guide at 15 and removed the posterior 9.5-10 mm  of the patella sized for 35 button and drilled the lollipop. The knee  was then once again hyperflexed exposing the proximal tibia. We sized for a #6 tibial base plate, applied the smokestack and the conical reamer followed by the the Delta fin keel punch. We then hammered into place the  RP trial femoral component, inserted a 1 trial bearing, trial patellar button, and took the knee through range of motion from 0-130 degrees. No thumb pressure was required for patellar  tracking. At this point, all trial components were removed, a double batch of DePuy HV cement  was mixed and applied to all bony metallic mating surfaces except for the posterior condyles of the femur itself. In order, we  hammered into place the tibial tray and removed excess cement, the femoral component and removed excess cement, a 35mm  RP bearing  was inserted, and the knee brought to full extension with compression.  The patellar button was clamped into place, and excess cement  removed. While the cement cured the wound was irrigated out with normal saline solution pulse lavage.. Ligament stability and patellar tracking were checked and found to be excellent.. The parapatellar arthrotomy was closed with  #1 Vicryl suture. The subcutaneous tissue with 0 and 2-0 undyed  Vicryl suture, and 4-0 Monocryl.. A dressing of Aquaseal,  4 x 4,  dressing sponges, Webril, and Ace wrap applied. Needle and sponge count were correct times 2.The patient awakened, extubated, and taken to recovery room without difficulty. Vascular status was normal, pulses 2+ and symmetric.   Clarence Dawson A 08/26/2017, 3:48 PM

## 2017-08-26 NOTE — Progress Notes (Signed)
Orthopedic Tech Progress Note Patient Details:  Clarence Dawson June 30, 1946 [id number]  Ortho Devices Ortho Device/Splint Location: footsie roll Ortho Device/Splint Interventions: Ordered, Application, Adjustment   Braulio Bosch 08/26/2017, 6:28 PM

## 2017-08-26 NOTE — Anesthesia Procedure Notes (Signed)
Spinal  Patient location during procedure: OR Start time: 08/26/2017 1:55 PM End time: 08/26/2017 1:59 PM Staffing Anesthesiologist: Lyn Hollingshead Performed: anesthesiologist  Spinal Block Patient position: sitting Prep: site prepped and draped and DuraPrep Patient monitoring: heart rate, cardiac monitor, continuous pulse ox and blood pressure Approach: midline Location: L3-4 Injection technique: single-shot Needle Needle type: Pencan  Needle gauge: 24 G Needle length: 9 cm Needle insertion depth: 7 cm Assessment Sensory level: T8

## 2017-08-26 NOTE — Telephone Encounter (Signed)
Dr. Archie Endo nurse called stating that the patient had a TKR and will need to be seen on 09/07/17. Pt is already schedule for this date. NF

## 2017-08-26 NOTE — Anesthesia Procedure Notes (Signed)
Procedure Name: MAC Date/Time: 08/26/2017 1:52 PM Performed by: Neldon Newport Pre-anesthesia Checklist: Timeout performed, Patient being monitored, Suction available, Emergency Drugs available and Patient identified Oxygen Delivery Method: Simple face mask Placement Confirmation: positive ETCO2 Dental Injury: Teeth and Oropharynx as per pre-operative assessment

## 2017-08-27 ENCOUNTER — Encounter (HOSPITAL_COMMUNITY): Payer: Self-pay | Admitting: Orthopedic Surgery

## 2017-08-27 LAB — BASIC METABOLIC PANEL
ANION GAP: 8 (ref 5–15)
BUN: 17 mg/dL (ref 6–20)
CHLORIDE: 103 mmol/L (ref 101–111)
CO2: 28 mmol/L (ref 22–32)
Calcium: 8.8 mg/dL — ABNORMAL LOW (ref 8.9–10.3)
Creatinine, Ser: 0.99 mg/dL (ref 0.61–1.24)
GFR calc Af Amer: >60 mL/min (ref >60)
GLUCOSE: 185 mg/dL — AB (ref 65–99)
POTASSIUM: 4.6 mmol/L (ref 3.5–5.1)
Sodium: 139 mmol/L (ref 135–145)

## 2017-08-27 LAB — GLUCOSE, CAPILLARY
GLUCOSE-CAPILLARY: 177 mg/dL — AB (ref 65–99)
GLUCOSE-CAPILLARY: 178 mg/dL — AB (ref 65–99)
Glucose-Capillary: 156 mg/dL — ABNORMAL HIGH (ref 65–99)
Glucose-Capillary: 187 mg/dL — ABNORMAL HIGH (ref 65–99)

## 2017-08-27 LAB — CBC
HEMATOCRIT: 47.9 % (ref 39.0–52.0)
HEMOGLOBIN: 16.3 g/dL (ref 13.0–17.0)
MCH: 30.8 pg (ref 26.0–34.0)
MCHC: 34 g/dL (ref 30.0–36.0)
MCV: 90.4 fL (ref 78.0–100.0)
PLATELETS: 131 10*3/uL — AB (ref 150–400)
RBC: 5.3 MIL/uL (ref 4.22–5.81)
RDW: 13.2 % (ref 11.5–15.5)
WBC: 8.9 10*3/uL (ref 4.0–10.5)

## 2017-08-27 MED ORDER — METOCLOPRAMIDE HCL 5 MG/ML IJ SOLN
5.0000 mg | Freq: Three times a day (TID) | INTRAMUSCULAR | Status: DC
  Administered 2017-08-27 (×2): 10 mg via INTRAVENOUS
  Filled 2017-08-27 (×2): qty 2

## 2017-08-27 MED ORDER — METOCLOPRAMIDE HCL 10 MG PO TABS
10.0000 mg | ORAL_TABLET | Freq: Three times a day (TID) | ORAL | Status: DC
  Administered 2017-08-27 – 2017-08-28 (×3): 10 mg via ORAL
  Filled 2017-08-27 (×4): qty 1

## 2017-08-27 NOTE — Discharge Instructions (Addendum)
Total Knee Replacement, Care After Refer to this sheet in the next few weeks. These instructions provide you with information about caring for yourself after your procedure. Your health care provider may also give you more specific instructions. Your treatment has been planned according to current medical practices, but problems sometimes occur. Call your health care provider if you have any problems or questions after your procedure. What can I expect after the procedure? After the procedure, it is common to have:  Pain and swelling.  A small amount of blood or clear fluid coming from your incision.  Limited range of motion.  Follow these instructions at home: Medicines  Take over-the-counter and prescription medicines only as told by your health care provider.  If you were prescribed an antibiotic medicine, take it as told by your health care provider. Do not stop taking the antibiotic even if you start to feel better.  If you were prescribed a blood thinner (anticoagulant), take it as told by your health care provider. If you have a splint or brace:  Wear the immobilizer as told by your health care provider. Remove it only as told by your health care provider.  Loosen the immobilizer if your toes tingle, become numb, or turn cold and blue.  Do not let your immobilizer get wet if it is not waterproof.  Keep the immobilizer clean. Bathing   Do not take baths, swim, or use a hot tub until your health care provider approves. Ask your health care provider if you can take showers. You may only be allowed to take sponge baths for bathing.  If you have an immobilizer that is not waterproof, cover it with a watertight covering when you take a bath or shower.  Keep your bandage (dressing) dry until your health care provider says it can be removed. Incision care and drain care  Check your incision area and drain site every day for signs of infection. Check for: ? More redness,  swelling, or pain. ? More fluid or blood. ? Warmth. ? Pus or a bad smell.  Follow instructions from your health care provider about how to take care of your incision. Make sure you: ? Wash your hands with soap and water before you change your dressing. If soap and water are not available, use hand sanitizer. ? Change your dressing as told by your health care provider. ? Leave stitches (sutures), skin glue, or adhesive strips in place. These skin closures may need to stay in place for 2 weeks or longer. If adhesive strip edges start to loosen and curl up, you may trim the loose edges. Do not remove adhesive strips completely unless your health care provider tells you to do that.  If you have a drain, follow instructions from your health care provider about caring for it. Do not remove the drain tube or any dressings around the tube opening unless your health care provider approves. Managing pain, stiffness, and swelling  If directed, put ice on your knee. ? Put ice in a plastic bag. ? Place a towel between your skin and the bag. ? Leave the ice on for 20 minutes, 2-3 times per day.  If directed, apply heat to the affected area as often as told by your health care provider. Use the heat source that your health care provider recommends, such as a moist heat pack or a heating pad. ? Place a towel between your skin and the heat source. ? Leave the heat on for 20-30 minutes. ?  Remove the heat if your skin turns bright red. This is especially important if you are unable to feel pain, heat, or cold. You may have a greater risk of getting burned.  Move your toes often to avoid stiffness and to lessen swelling.  Raise (elevate) your knee above the level of your heart while you are sitting or lying down.  Wear elastic knee support for as long as told by your health care provider. Driving   Do not drive until your health care provider approves. Ask your health care provider when it is safe to  drive if you have an immobilizer on your knee.  Do not drive or operate heavy machinery while taking prescription pain medicine.  Do not drive for 24 hours if you received a sedative. Activity  Do not lift anything that is heavier than 10 lb (4.5 kg) until your health care provider approves.  Do not play contact sports until your health care provider approves.  Avoid high-impact activities, including running, jumping rope, and jumping jacks.  Avoid sitting for a long time without moving. Get up and move around at least every few hours.  If physical therapy was prescribed, do exercises as told by your health care provider.  Return to your normal activities as told by your health care provider. Ask your health care provider what activities are safe for you. Safety  Do not use your leg to support your body weight until your health care provider approves. Use crutches or a walker as told by your health care provider. General instructions   Do not have any dental work done for at least 3 months after your surgery. When you do have dental work done, tell your dentist about your joint replacement.  Do not use any tobacco products, such as cigarettes, chewing tobacco, or e-cigarettes. If you need help quitting, ask your health care provider.  Wear compression stockings as told by your health care provider. These stockings help to prevent blood clots and reduce swelling in your legs.  If you have been sent home with a continuous passive motion machine, use it as told by your health care provider.  Drink enough fluid to keep your urine clear or pale yellow.  If you have been instructed to lose weight, follow instructions from your health care provider about how to do this safely.  Keep all follow-up visits as told by your health care provider. This is important. Contact a health care provider if:  You have more redness, swelling, or pain around your incision or drain.  You have more  fluid or blood coming from your incision or drain.  Your incision or drain site feels warm to the touch.  You have pus or a bad smell coming from your incision or drain.  You have a fever.  Your incision breaks open after your health care provider removes your sutures, skin glue, or adhesive tape.  Your prosthesis feels loose.  You have knee pain that does not go away. Get help right away if:  You have a rash.  You have pain or swelling in your calf or thigh.  You have shortness of breath or difficulty breathing.  You have chest pain.  Your range of motion in your knee is getting worse. This information is not intended to replace advice given to you by your health care provider. Make sure you discuss any questions you have with your health care provider. Document Released: 07/04/2005 Document Revised: 08/18/2016 Document Reviewed: 11/21/2015 Elsevier Interactive Patient  Education  2017 [place] on my medicine - XARELTO (Rivaroxaban)  This medication education was reviewed with me or my healthcare representative as part of my discharge preparation.  The pharmacist that spoke with me during my hospital stay was:  Jaquita Folds, [facility]  Why was Xarelto prescribed for you? Xarelto was prescribed for you to reduce the risk of a blood clot forming that can cause a stroke if you have a medical condition called atrial fibrillation (a type of irregular heartbeat).  What do you need to know about xarelto ? Take your Xarelto ONCE DAILY at the same time every day with your evening meal. If you have difficulty swallowing the tablet whole, you may crush it and mix in applesauce just prior to taking your dose.  Take Xarelto exactly as prescribed by your doctor and DO NOT stop taking Xarelto without talking to the doctor who prescribed the medication.  Stopping without other stroke prevention medication to take the place of Xarelto may increase your risk of developing a  clot that causes a stroke.  Refill your prescription before you run out.  After discharge, you should have regular check-up appointments with your healthcare provider that is prescribing your Xarelto.  In the future your dose may need to be changed if your kidney function or weight changes by a significant amount.  What do you do if you miss a dose? If you are taking Xarelto ONCE DAILY and you miss a dose, take it as soon as you remember on the same day then continue your regularly scheduled once daily regimen the next day. Do not take two doses of Xarelto at the same time or on the same day.   Important Safety Information A possible side effect of Xarelto is bleeding. You should call your healthcare provider right away if you experience any of the following: ? Bleeding from an injury or your nose that does not stop. ? Unusual colored urine (red or dark brown) or unusual colored stools (red or black). ? Unusual bruising for unknown reasons. ? A serious fall or if you hit your head (even if there is no bleeding).  Some medicines may interact with Xarelto and might increase your risk of bleeding while on Xarelto. To help avoid this, consult your healthcare provider or pharmacist prior to using any new prescription or non-prescription medications, including herbals, vitamins, non-steroidal anti-inflammatory drugs (NSAIDs) and supplements.  This website has more information on Xarelto: [email or web address]

## 2017-08-27 NOTE — Progress Notes (Signed)
Transitions of Care - Pain Medication Review for Discharge to Home  Patient Clarence Dawson is a 71 YO M hospitalized on 08/26/17, s/p R TKA.   Assessment/Recommendation:  Opioid naive patient with moderate pain who is experiencing excessive n/v. Has received 83 mg of oral morphine equivalents since 1840 last evening. Upon interview, patient was alert and oriented and stated pain was mild at a 3/10.   Patient states that he would prefer hydrocodone-acetaminophen based on previous experience and he would like an opioid that is less potent than oxycodone.   At discharge, could consider:   1. Hydrocodone-acetaminophen 5-325, 1-2 tabs po q4h PRN for mild-moderate pain #84  2. As he tapers down on hydrocodone-acetaminophen, can start taking home dose of acetaminophen. Counseled patient on max daily dose of APAP.   ____________________________________________________________ Prior to admission  PTA, patient does not report taking any opioid pain medications, confirmed by  [facility] check 08/27/17.   Pertinent co-morbid conditions include atrial fibrillation, syncope, OSA, asthma.   CrCl=84.4 ml/min (using Cockcroft-Gault equation).   Other pertinent PTA medications include Xarelto 20 mg daily, acetaminophen 1000 mg po TID, clonidine 0.1 mg po BID.    ------------------------------------------------------------------------------ During Admission  Today, patient reports 3/10 for surgical pain, reporting he received APAP and oxycodone approx 20 min ago. States that n/v was occurring after dexamethasone administration.   Scheduled Opioids:  -none  PRN Opioids:  -oxycodone IR 5-10 mg PO q3h PRN breakthrough pain -hydromorphone 1 mg IV q2h PRN severe pain, unresolved breakthrough pain  Mg of Morphine Equivalents (past 24 hours): 83 mg of oral morphine since on 5N   Non-opioid analgesics:  -acetaminophen 1000 mg po q6h x 24 hours -acetaminophen 650 mg po q6h PRN mild pain -dexamethasone 10 mg  IV q8h x 4 doses   ____________________________________________________________ Assessment of Pain and Goals  Patient expects to need a less potent opioid than oxycodone at discharge and states that he has had other orthopedic surgeries in the past, so is aware of pain expectations through recovery.  ____________________________________________________________  Counseling provided:  -APAP max dose -Pain expectations and goals -Answered question regarding Xarelto dose decrease and if new Rx will be provided. Advised patient to avoid splitting tablets.  -Opioid disposal. Pt and wife already aware of med drop box [place] police station [place].

## 2017-08-27 NOTE — Progress Notes (Signed)
PT Cancellation Note  Patient Details Name: Clarence Dawson MRN: [id number] DOB: 01/21/1946   Cancelled Treatment:    Reason Eval/Treat Not Completed: Other (comment); patient reports just back from walking entire unit with ortho PA this morning about 20 minutes ago.  Will see later today.   Reginia Naas 08/27/2017, 10:53 AM Magda Kiel, [place] 08/27/2017

## 2017-08-27 NOTE — Evaluation (Signed)
Physical Therapy Evaluation Patient Details Name: Clarence Dawson MRN: [id number] DOB: 1946-09-08 Today's Date: 08/27/2017   History of Present Illness  Patient is a 71 y/o male admitted for R TKA.  PMH positive for HTN, a-fib, asthma, DM, pacemaker, GERD, and hiatal hernia.  Clinical Impression  Patient presents with decreased independence with mobility due to weakness and decreased ROM and pain R LE.  Feel he will benefit from skilled PT in the acute setting to allow return home with family support.    Follow Up Recommendations DC plan and follow up therapy as arranged by surgeon    Equipment Recommendations  None recommended by PT    Recommendations for Other Services       Precautions / Restrictions Precautions Precautions: Knee Restrictions RLE Weight Bearing: Weight bearing as tolerated      Mobility  Bed Mobility               General bed mobility comments: up in chair  Transfers Overall transfer level: Needs assistance Equipment used: Rolling walker (2 wheeled) Transfers: Sit to/from Stand Sit to Stand: Supervision;Min guard         General transfer comment: increased time; little light headed initially standing  Ambulation/Gait Ambulation/Gait assistance: Min guard;Supervision Ambulation Distance (Feet): 140 Feet Assistive device: Rolling walker (2 wheeled) Gait Pattern/deviations: Step-through pattern;Decreased stride length     General Gait Details: very mild antalgia with ambulation  Stairs            Wheelchair Mobility    Modified Rankin (Stroke Patients Only)       Balance Overall balance assessment: Needs assistance   Sitting balance-Leahy Scale: Good       Standing balance-Leahy Scale: Fair                               Pertinent Vitals/Pain Pain Assessment: 0-10 Pain Score: 6  Pain Location: R knee with ambulation Pain Descriptors / Indicators: Sore Pain Intervention(s): Monitored during  session;Repositioned;Ice applied    Home Living Family/patient expects to be discharged to:: Private residence Living Arrangements: Spouse/significant other Available Help at Discharge: Family Type of Home: House Home Access: Stairs to enter Entrance Stairs-Rails: None Entrance Stairs-Number of Steps: 1 Home Layout: Two level;Able to live on main level with bedroom/bathroom Home Equipment: Gilford Rile - 2 wheels;Tub bench      Prior Function Level of Independence: Independent               Hand Dominance        Extremity/Trunk Assessment   Upper Extremity Assessment Upper Extremity Assessment: Overall WFL for tasks assessed    Lower Extremity Assessment Lower Extremity Assessment: RLE deficits/detail RLE Deficits / Details: AROM grossly 5-80, strength able to lift unaided for knee extension, some quad lag with SLR       Communication   Communication: No difficulties  Cognition Arousal/Alertness: Awake/alert Behavior During Therapy: WFL for tasks assessed/performed Overall Cognitive Status: Within Functional Limits for tasks assessed                                        General Comments General comments (skin integrity, edema, etc.): wife in room; reports PA told her to walk pt once about every hour or so; noted pt with unsteadiness and lightheaded initially standing; discussed having staff present when pt getting up  for safety.    Exercises     Assessment/Plan    PT Assessment Patient needs continued PT services  PT Problem List Decreased strength;Decreased activity tolerance;Decreased balance;Decreased range of motion;Decreased mobility;Decreased knowledge of use of DME;Pain;Decreased knowledge of precautions       PT Treatment Interventions DME instruction;Therapeutic activities;Patient/family education;Therapeutic exercise;Gait training;Stair training;Balance training;Functional mobility training    PT Goals (Current goals can be found in  the Care Plan section)  Acute Rehab PT Goals Patient Stated Goal: To return to independent PT Goal Formulation: With patient Time For Goal Achievement: 08/29/17 Potential to Achieve Goals: Good    Frequency 7X/week   Barriers to discharge        Co-evaluation               AM-PAC PT "6 Clicks" Daily Activity  Outcome Measure Difficulty turning over in bed (including adjusting bedclothes, sheets and blankets)?: None Difficulty moving from lying on back to sitting on the side of the bed? : A Little Difficulty sitting down on and standing up from a chair with arms (e.g., wheelchair, bedside commode, etc,.)?: Unable Help needed moving to and from a bed to chair (including a wheelchair)?: A Little Help needed walking in hospital room?: A Little Help needed climbing 3-5 steps with a railing? : A Little 6 Click Score: 17    End of Session Equipment Utilized During Treatment: Gait belt Activity Tolerance: Patient tolerated treatment well Patient left: in chair;with call bell/phone within reach;with family/visitor present   PT Visit Diagnosis: Difficulty in walking, not elsewhere classified (R26.2);Pain Pain - Right/Left: Right Pain - part of body: Knee    Time: 2536-6440 PT Time Calculation (min) (ACUTE ONLY): 18 min   Charges:   PT Evaluation $PT Eval Moderate Complexity: 1 Mod     PT G CodesMagda Kiel, [place] [phone] 08/27/2017   Reginia Naas 08/27/2017, 1:23 PM

## 2017-08-27 NOTE — Progress Notes (Signed)
Physical Therapy Treatment Patient Details Name: Clarence Dawson MRN: [id number] DOB: May 02, 1946 Today's Date: 08/27/2017    History of Present Illness Patient is a 71 y/o male admitted for R TKA.  PMH positive for HTN, a-fib, asthma, DM, pacemaker, GERD, and hiatal hernia.    PT Comments    Patient progressing with ambulation distance and safety, though still needing cues for safety.  Able to negotiate stairs.  Will see in am prior to d/c for finalized HEP instruction.   Follow Up Recommendations  DC plan and follow up therapy as arranged by surgeon     Equipment Recommendations  None recommended by PT    Recommendations for Other Services       Precautions / Restrictions Precautions Precautions: Knee Restrictions Weight Bearing Restrictions: Yes RLE Weight Bearing: Weight bearing as tolerated    Mobility  Bed Mobility               General bed mobility comments: Pt OOB in chair upon arrival.  Transfers Overall transfer level: Needs assistance Equipment used: Rolling walker (2 wheeled) Transfers: Sit to/from Stand Sit to Stand: Supervision         General transfer comment: for safety. good hand placement and technique  Ambulation/Gait Ambulation/Gait assistance: Supervision Ambulation Distance (Feet): 400 Feet Assistive device: Rolling walker (2 wheeled) Gait Pattern/deviations: Step-through pattern     General Gait Details: adjusted walker height with improved upright posture, needed cues for proximity to walker around corners and to slow down   Stairs Stairs: Yes   Stair Management: Step to pattern;Backwards;With walker Number of Stairs: 2 General stair comments: cues for sequence and technique  Wheelchair Mobility    Modified Rankin (Stroke Patients Only)       Balance Overall balance assessment: Needs assistance Sitting-balance support: Feet supported;No upper extremity supported Sitting balance-Leahy Scale: Good     Standing  balance support: No upper extremity supported;During functional activity Standing balance-Leahy Scale: Fair                              Cognition Arousal/Alertness: Awake/alert Behavior During Therapy: WFL for tasks assessed/performed Overall Cognitive Status: Within Functional Limits for tasks assessed                                        Exercises Total Joint Exercises Heel Slides: AROM;Right;Seated;10 reps Long Arc Quad: AROM;Right;Seated;10 reps    General Comments General comments (skin integrity, edema, etc.): practiced/simulated car transfers      Pertinent Vitals/Pain Pain Assessment: Faces Pain Score: 4  Faces Pain Scale: Hurts little more Pain Location: R knee Pain Descriptors / Indicators: Sore Pain Intervention(s): Monitored during session;Repositioned;Ice applied    Home Living Family/patient expects to be discharged to:: Private residence Living Arrangements: Spouse/significant other Available Help at Discharge: Family;Available 24 hours/day Type of Home: House Home Access: Stairs to enter Entrance Stairs-Rails: None Home Layout: Two level;Able to live on main level with bedroom/bathroom Home Equipment: Gilford Rile - 2 wheels;Tub bench;Cane - single point      Prior Function Level of Independence: Independent          PT Goals (current goals can now be found in the care plan section) Acute Rehab PT Goals Patient Stated Goal: To return to independent PT Goal Formulation: With patient Time For Goal Achievement: 08/29/17 Potential to Achieve Goals: Good  Progress towards PT goals: Progressing toward goals    Frequency    7X/week      PT Plan      Co-evaluation              AM-PAC PT "6 Clicks" Daily Activity  Outcome Measure  Difficulty turning over in bed (including adjusting bedclothes, sheets and blankets)?: None Difficulty moving from lying on back to sitting on the side of the bed? : A Little Difficulty  sitting down on and standing up from a chair with arms (e.g., wheelchair, bedside commode, etc,.)?: A Little Help needed moving to and from a bed to chair (including a wheelchair)?: A Little Help needed walking in hospital room?: A Little Help needed climbing 3-5 steps with a railing? : A Little 6 Click Score: 19    End of Session Equipment Utilized During Treatment: Gait belt Activity Tolerance: Patient tolerated treatment well Patient left: in chair;with call bell/phone within reach   PT Visit Diagnosis: Difficulty in walking, not elsewhere classified (R26.2);Pain Pain - Right/Left: Right Pain - part of body: Knee     Time: 1510-1530 PT Time Calculation (min) (ACUTE ONLY): 20 min  Charges:  $Gait Training: 8-22 mins                    G CodesMagda Kiel, [place] [phone] 08/27/2017    Reginia Naas 08/27/2017, 4:22 PM

## 2017-08-27 NOTE — Evaluation (Addendum)
Occupational Therapy Evaluation and Discharge Patient Details Name: Clarence Dawson MRN: [id number] DOB: June 04, 1946 Today's Date: 08/27/2017    History of Present Illness Patient is a 71 y/o male admitted for R TKA.  PMH positive for HTN, a-fib, asthma, DM, pacemaker, GERD, and hiatal hernia.   Clinical Impression   Pt reports he was independent with ADL PTA. Currently pt requires supervision for ADL and functional mobility with the exception of min assist for LB dressing. All safety and ADL education completed with pt and wife. Pt planning to d/c home with 24/7 supervision from family. No further acute OT needs identified; signing off at this time. Please re-consult if needs change. Thank you for this referral.    Follow Up Recommendations  No OT follow up;Supervision - Intermittent    Equipment Recommendations  None recommended by OT    Recommendations for Other Services       Precautions / Restrictions Precautions Precautions: Knee Restrictions Weight Bearing Restrictions: Yes RLE Weight Bearing: Weight bearing as tolerated      Mobility Bed Mobility               General bed mobility comments: Pt OOB in chair upon arrival.  Transfers Overall transfer level: Needs assistance Equipment used: Rolling walker (2 wheeled) Transfers: Sit to/from Stand Sit to Stand: Supervision         General transfer comment: for safety. good hand placement and technique    Balance Overall balance assessment: Needs assistance Sitting-balance support: Feet supported;No upper extremity supported Sitting balance-Leahy Scale: Good     Standing balance support: No upper extremity supported;During functional activity Standing balance-Leahy Scale: Fair                             ADL either performed or assessed with clinical judgement   ADL Overall ADL's : Needs assistance/impaired Eating/Feeding: Set up;Sitting   Grooming: Supervision/safety;Standing   Upper  Body Bathing: Set up;Sitting   Lower Body Bathing: Supervison/ safety;Sit to/from stand   Upper Body Dressing : Set up;Sitting   Lower Body Dressing: Minimal assistance;Sit to/from stand Lower Body Dressing Details (indicate cue type and reason): wife assisted with donning underwear. Educated on compensatory strategies-R leg into clothing first and attempting functional independence as able Toilet Transfer: Supervision/safety;Ambulation;RW Toilet Transfer Details (indicate cue type and reason): simulated by sit to stand from chair with functional mobility in room       Tub/Shower Transfer Details (indicate cue type and reason): Pt with tub bench at home; is familiar with technique and does not want to practice Functional mobility during ADLs: Supervision/safety;Rolling walker       Vision         Perception     Praxis      Pertinent Vitals/Pain Pain Assessment: Faces Pain Score: 6  Faces Pain Scale: Hurts little more Pain Location: R knee Pain Descriptors / Indicators: Sore Pain Intervention(s): Monitored during session     Hand Dominance     Extremity/Trunk Assessment Upper Extremity Assessment Upper Extremity Assessment: Overall WFL for tasks assessed   Lower Extremity Assessment Lower Extremity Assessment: Defer to PT evaluation    Cervical / Trunk Assessment Cervical / Trunk Assessment: Normal   Communication Communication Communication: No difficulties   Cognition Arousal/Alertness: Awake/alert Behavior During Therapy: WFL for tasks assessed/performed Overall Cognitive Status: Within Functional Limits for tasks assessed  General Comments  wife in room; reports PA told her to walk pt once about every hour or so; noted pt with unsteadiness and lightheaded initially standing; discussed having staff present when pt getting up for safety.    Exercises     Shoulder Instructions      Home Living  Family/patient expects to be discharged to:: Private residence Living Arrangements: Spouse/significant other Available Help at Discharge: Family;Available 24 hours/day Type of Home: House Home Access: Stairs to enter [employer] of Steps: 1 Entrance Stairs-Rails: None Home Layout: Two level;Able to live on main level with bedroom/bathroom     Bathroom Shower/Tub: Tub/shower unit;Curtain   Bathroom Toilet: Handicapped height     Home Equipment: Environmental consultant - 2 wheels;Tub bench;Cane - single point          Prior Functioning/Environment Level of Independence: Independent                 OT Problem List:        OT Treatment/Interventions:      OT Goals(Current goals can be found in the care plan section) Acute Rehab OT Goals Patient Stated Goal: To return to independent OT Goal Formulation: All assessment and education complete, DC therapy  OT Frequency:     Barriers to D/C:            Co-evaluation              AM-PAC PT "6 Clicks" Daily Activity     Outcome Measure Help from another person eating meals?: None Help from another person taking care of personal grooming?: A Little Help from another person toileting, which includes using toliet, bedpan, or urinal?: A Little Help from another person bathing (including washing, rinsing, drying)?: A Little Help from another person to put on and taking off regular upper body clothing?: None Help from another person to put on and taking off regular lower body clothing?: A Little 6 Click Score: 20   End of Session Equipment Utilized During Treatment: Rolling walker CPM Right Knee CPM Right Knee: Off  Activity Tolerance: Patient tolerated treatment well Patient left: in chair;with call bell/phone within reach;with family/visitor present  OT Visit Diagnosis: Other abnormalities of gait and mobility (R26.89)                Time: 1447-1500 OT Time Calculation (min): 13 min Charges:  OT General  Charges $OT Visit: 1 Visit OT Evaluation $OT Eval Moderate Complexity: 1 Mod G-Codes:     Ceaira Ernster A. Ulice Brilliant, M.S., OTR/L Pager: [place] 08/27/2017, 3:08 PM

## 2017-08-27 NOTE — Plan of Care (Signed)
Problem: Pain Managment: Goal: General experience of comfort will improve Outcome: Progressing Patient voices understanding of pain scale and calls for pain medication when needed

## 2017-08-27 NOTE — Progress Notes (Signed)
Subjective: 1 Day Post-Op Procedure(s) (LRB): TOTAL KNEE ARTHROPLASTY (Right) LEFT KNEE INJECTION (Left) Patient reports pain as 4 on 0-10 scale.    Objective: Vital signs in last 24 hours: Temp:  [97.6 F (36.4 C)-99.2 F (37.3 C)] 98.6 F (37 C) (08/30 1000) Pulse Rate:  [73-86] 75 (08/30 1000) Resp:  [6-20] 16 (08/30 1000) BP: (98-157)/(60-105) 100/61 (08/30 1000) SpO2:  [90 %-100 %] 91 % (08/30 1000) Weight:  [108.4 kg (239 lb)] 108.4 kg (239 lb) (08/29 1037)  Intake/Output from previous day: 08/29 0701 - 08/30 0700 In: 1514.3 [P.O.:220; I.V.:1244.3; IV Piggyback:50] Out: 2050 [QPYPP:5093; Blood:100] Intake/Output this shift: No intake/output data recorded.   Recent Labs  08/27/17 0300  HGB 16.3    Recent Labs  08/27/17 0300  WBC 8.9  RBC 5.30  HCT 47.9  PLT 131*    Recent Labs  08/27/17 0300  NA 139  K 4.6  CL 103  CO2 28  BUN 17  CREATININE 0.99  GLUCOSE 185*  CALCIUM 8.8*   No results for input(s): LABPT, INR in the last 72 hours.  Neurologically intact ABD soft Sensation intact distally Intact pulses distally Dorsiflexion/Plantar flexion intact Incision: dressing C/D/I  Assessment/Plan: 1 Day Post-Op Procedure(s) (LRB): TOTAL KNEE ARTHROPLASTY (Right) LEFT KNEE INJECTION (Left)  Principal Problem:   Primary localized osteoarthritis of right knee Active Problems:   Essential hypertension, benign   Premature ventricular contractions   PPM-St.Jude   Persistent atrial fibrillation (HCC)   Syncope   Hyperthyroidism   Hyperlipidemia   RBBB (right bundle branch block)   Tricuspid valve disorder   Aortic valve disorder   Carotid artery stenosis   Diabetes (HCC)   OSA (obstructive sleep apnea)   Morbid obesity due to excess calories (HCC)   Asthma with acute exacerbation  Advance diet Up with therapy D/C IV fluids Plan for discharge tomorrow Discharge home with home health  Patient having difficulty with projectile vomiting so I  have changed reglan to q 6 hrs scheduled until D/C  Oz Gammel J 08/27/2017, 10:29 AM

## 2017-08-28 ENCOUNTER — Encounter (HOSPITAL_COMMUNITY): Payer: Self-pay

## 2017-08-28 ENCOUNTER — Ambulatory Visit (HOSPITAL_COMMUNITY): Payer: Medicare Other | Admitting: Physical Therapy

## 2017-08-28 LAB — BASIC METABOLIC PANEL
Anion gap: 6 (ref 5–15)
BUN: 29 mg/dL — ABNORMAL HIGH (ref 6–20)
CALCIUM: 8.7 mg/dL — AB (ref 8.9–10.3)
CO2: 31 mmol/L (ref 22–32)
CREATININE: 1.09 mg/dL (ref 0.61–1.24)
Chloride: 99 mmol/L — ABNORMAL LOW (ref 101–111)
GFR calc non Af Amer: >60 mL/min (ref >60)
Glucose, Bld: 200 mg/dL — ABNORMAL HIGH (ref 65–99)
Potassium: 5.3 mmol/L — ABNORMAL HIGH (ref 3.5–5.1)
SODIUM: 136 mmol/L (ref 135–145)

## 2017-08-28 LAB — GLUCOSE, CAPILLARY
GLUCOSE-CAPILLARY: 168 mg/dL — AB (ref 65–99)
Glucose-Capillary: 115 mg/dL — ABNORMAL HIGH (ref 65–99)

## 2017-08-28 LAB — CBC
HCT: 43.8 % (ref 39.0–52.0)
Hemoglobin: 14.3 g/dL (ref 13.0–17.0)
MCH: 29.6 pg (ref 26.0–34.0)
MCHC: 32.6 g/dL (ref 30.0–36.0)
MCV: 90.7 fL (ref 78.0–100.0)
Platelets: 141 10*3/uL — ABNORMAL LOW (ref 150–400)
RBC: 4.83 MIL/uL (ref 4.22–5.81)
RDW: 13.2 % (ref 11.5–15.5)
WBC: 10.2 10*3/uL (ref 4.0–10.5)

## 2017-08-28 MED ORDER — RIVAROXABAN 10 MG PO TABS: 10.0000 mg | ORAL_TABLET | Freq: Every day | ORAL | 0 refills | Status: DC

## 2017-08-28 MED ORDER — DOCUSATE SODIUM 100 MG PO CAPS: ORAL_CAPSULE | ORAL | 0 refills | Status: DC

## 2017-08-28 MED ORDER — OXYCODONE HCL 5 MG PO TABS: ORAL_TABLET | ORAL | 0 refills | Status: DC

## 2017-08-28 NOTE — Progress Notes (Signed)
Physical Therapy Treatment Patient Details Name: Clarence Dawson MRN: [id number] DOB: 09-28-1946 Today's Date: 08/28/2017    History of Present Illness Patient is a 71 y/o male admitted for R TKA.  PMH positive for HTN, a-fib, asthma, DM, pacemaker, GERD, and hiatal hernia.    PT Comments    Pt performed gait, reviewed stair negotiation and reviewed Supine HEP.  HEP issued and pt educated on frequency.  Pt to d/c home today.  Will f/u this pm if patient remains hospitalized.     Follow Up Recommendations  DC plan and follow up therapy as arranged by surgeon     Equipment Recommendations  None recommended by PT    Recommendations for Other Services       Precautions / Restrictions Precautions Precautions: Knee Restrictions Weight Bearing Restrictions: Yes RLE Weight Bearing: Weight bearing as tolerated    Mobility  Bed Mobility               General bed mobility comments: Pt OOB in chair upon arrival.  Transfers Overall transfer level: Needs assistance Equipment used: Rolling walker (2 wheeled) Transfers: Sit to/from Stand Sit to Stand: Supervision         General transfer comment: for safety. good hand placement and technique, poor eccentric loading when returning to chair.    Ambulation/Gait Ambulation/Gait assistance: Supervision Ambulation Distance (Feet): 200 Feet Assistive device: Rolling walker (2 wheeled) Gait Pattern/deviations: Step-through pattern;Antalgic   Gait velocity interpretation: Below normal speed for age/gender General Gait Details: Pt required cues for upper trunk control and continuation of step through pattern.  Pt presents with antalgic gait pattern.     Stairs Stairs: Yes   Stair Management: Step to pattern;Backwards;With walker Number of Stairs: 2 General stair comments: cues for sequence and technique.  MIn assist to maintain RW stability.    Wheelchair Mobility    Modified Rankin (Stroke Patients Only)        Balance Overall balance assessment: Needs assistance Sitting-balance support: Feet supported;No upper extremity supported Sitting balance-Leahy Scale: Good       Standing balance-Leahy Scale: Fair                              Cognition Arousal/Alertness: Awake/alert Behavior During Therapy: WFL for tasks assessed/performed Overall Cognitive Status: Within Functional Limits for tasks assessed                                        Exercises Total Joint Exercises Ankle Circles/Pumps: AROM;Both;10 reps;Supine Quad Sets: AROM;Right;10 reps;Supine Short Arc Quad: AROM;Right;10 reps;Supine Heel Slides: AROM;Right;10 reps;Supine Hip ABduction/ADduction: AROM;Right;10 reps;Supine Straight Leg Raises: AROM;Right;10 reps;Supine Goniometric ROM: 6-78 degrees flexion in R knee.      General Comments        Pertinent Vitals/Pain Pain Assessment: 0-10 Pain Score: 8  Pain Location: R knee Pain Descriptors / Indicators: Sore Pain Intervention(s): Monitored during session;Repositioned;Ice applied;Patient requesting pain meds-RN notified    Home Living                      Prior Function            PT Goals (current goals can now be found in the care plan section) Acute Rehab PT Goals Patient Stated Goal: To return to independent Potential to Achieve Goals: Good Progress towards PT goals: Progressing  toward goals    Frequency    7X/week      PT Plan Current plan remains appropriate    Co-evaluation              AM-PAC PT "6 Clicks" Daily Activity  Outcome Measure  Difficulty turning over in bed (including adjusting bedclothes, sheets and blankets)?: None Difficulty moving from lying on back to sitting on the side of the bed? : A Little Difficulty sitting down on and standing up from a chair with arms (e.g., wheelchair, bedside commode, etc,.)?: A Little Help needed moving to and from a bed to chair (including a  wheelchair)?: A Little Help needed walking in hospital room?: A Little Help needed climbing 3-5 steps with a railing? : A Little 6 Click Score: 19    End of Session Equipment Utilized During Treatment: Gait belt Activity Tolerance: Patient tolerated treatment well Patient left: in chair;with call bell/phone within reach   PT Visit Diagnosis: Difficulty in walking, not elsewhere classified (R26.2);Pain Pain - Right/Left: Right Pain - part of body: Knee     Time: 3212-2482 PT Time Calculation (min) (ACUTE ONLY): 23 min  Charges:  $Gait Training: 8-22 mins $Therapeutic Exercise: 8-22 mins                    G Codes:       Governor Rooks, PTA pager [phone]    Cristela Blue 08/28/2017, 10:56 AM

## 2017-08-28 NOTE — Discharge Summary (Signed)
Patient ID: Clarence Dawson MRN: [id number] DOB/AGE: March 04, 1946 71 y.o.  Admit date: 08/26/2017 Discharge date: 08/28/2017  Admission Diagnoses:  Principal Problem:   Primary localized osteoarthritis of right knee Active Problems:   Essential hypertension, benign   Premature ventricular contractions   PPM-[place]   Persistent atrial fibrillation (HCC)   Syncope   Hyperthyroidism   Hyperlipidemia   RBBB (right bundle branch block)   Tricuspid valve disorder   Aortic valve disorder   Carotid artery stenosis   Diabetes (HCC)   OSA (obstructive sleep apnea)   Morbid obesity due to excess calories ([place])   Asthma with acute exacerbation   Discharge Diagnoses:  Same  Past Medical History:  Diagnosis Date  . Allergic rhinitis   . Aortic valve disorder   . Asthma    since childhood- seasonal allergies induced  . Cancer ([place])    Skin cancer- squamous, basal  . Carotid artery stenosis   . Essential hypertension   . Full dentures   . GERD (gastroesophageal reflux disease)   . H/O hiatal hernia   . Hemorrhage of rectum   . Hyperlipidemia   . Hypothyroidism   . 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 OSA (obstructive sleep apnea)   . Osteoarthritis   . Pacemaker    Oct 2005 in [place].  Marland Kitchen PAF (paroxysmal atrial fibrillation) ([place])   . Pneumonia    "several Times" 2015 last time  . Primary localized osteoarthritis of right knee 08/11/2017  . RBBB (right bundle branch block)   . Sinoatrial node dysfunction (HCC)   . Syncope   . Tricuspid valve disorder   . Type 2 diabetes mellitus (HCC)    Type II    Surgeries: Procedure(s): TOTAL KNEE ARTHROPLASTY LEFT KNEE INJECTION on 08/26/2017   Consultants:   Discharged Condition: Improved  Hospital Course: Clarence Dawson is an 71 y.o. male who was admitted 08/26/2017 for operative treatment ofPrimary localized osteoarthritis of right knee. Patient has severe unremitting pain that affects sleep, daily activities, and work/hobbies. After pre-op  clearance the patient was taken to the operating room on 08/26/2017 and underwent  Procedure(s): TOTAL KNEE ARTHROPLASTY LEFT KNEE INJECTION.    Patient was given perioperative antibiotics: Anti-infectives    Start     Dose/Rate Route Frequency Ordered Stop   08/26/17 2000  ceFAZolin (ANCEF) IVPB 2g/100 mL premix     2 g 200 mL/hr over 30 Minutes Intravenous Every 6 hours 08/26/17 1846 08/27/17 0759   08/26/17 1200  ceFAZolin (ANCEF) 3 g in dextrose 5 % 50 mL IVPB     3 g 130 mL/hr over 30 Minutes Intravenous To ShortStay Surgical 08/25/17 1309 08/26/17 1346   08/24/17 1030  ceFAZolin (ANCEF) 3 g in dextrose 5 % 50 mL IVPB     3 g 130 mL/hr over 30 Minutes Intravenous To ShortStay Surgical 08/21/17 1230 08/25/17 1030       Patient was given sequential compression devices, early ambulation, and chemoprophylaxis to prevent DVT.  Patient benefited maximally from hospital stay and there were no complications.    Recent vital signs: Patient Vitals for the past 24 hrs:  BP Temp Temp src Pulse Resp SpO2  08/28/17 1000 (!) 99/52 98.2 F (36.8 C) - 72 16 95 %  08/28/17 0859 - - - - - 96 %  08/27/17 2018 114/68 98.5 F (36.9 C) Oral 88 18 96 %  08/27/17 2007 - - - - - 94 %  08/27/17 1420 116/64 98.8 F (37.1 C) Oral  76 18 91 %     Recent laboratory studies:  Recent Labs  08/27/17 0300 08/28/17 0425  WBC 8.9 10.2  HGB 16.3 14.3  HCT 47.9 43.8  PLT 131* 141*  NA 139 136  K 4.6 5.3*  CL 103 99*  CO2 28 31  BUN 17 29*  CREATININE 0.99 1.09  GLUCOSE 185* 200*  CALCIUM 8.8* 8.7*     Discharge Medications:   Allergies as of 08/28/2017      Reactions   Fish Allergy Anaphylaxis, Shortness Of Breath   SEAFOOD   Food Anaphylaxis, Shortness Of Breath   TREE NUTS   Iodinated Diagnostic Agents Hives, Shortness Of Breath   Patient states hives to throat closing. (01/15/17: patient states this reaction was "about 20 years ago" with possibly an IVP.  He now says high doses of  prednisone "throw me into AFib."  He has tolerated CT arthrograms with Benadrly 50mg  PO one hour before injection.  Brita Romp, RN)   Goat-derived Products Hives   GOAT CHEESE GOATS   Prednisone Palpitations   PRECIPITATES A-FIB   Clavulanic Acid Diarrhea   Shellfish Allergy Other (See Comments)   To shellfish, crabs.  Makes him feel like "things are crawling all over" me.  Denies airway issues with these foods.  Brita Romp, RN 01/15/17)   Voltaren [diclofenac Sodium] Other (See Comments)   Feels like things are crawling on him      Medication List    TAKE these medications   acetaminophen 500 MG tablet Commonly known as:  TYLENOL Take 1,000 mg by mouth 3 (three) times daily.   albuterol 0.63 MG/3ML nebulizer solution Commonly known as:  ACCUNEB Take 3 mLs (0.63 mg total) by nebulization every 4 (four) hours as needed for wheezing. What changed:  Another medication with the same name was changed. Make sure you understand how and when to take each.   albuterol 108 (90 Base) MCG/ACT inhaler Commonly known as:  PROAIR HFA 2 puffs every 4 hours as needed only  if your can't catch your breath What changed:  how much to take  how to take this  when to take this  reasons to take this  additional instructions   amLODipine 10 MG tablet Commonly known as:  NORVASC Take 10 mg by mouth daily.   bisoprolol 5 MG tablet Commonly known as:  ZEBETA TAKE 1 AND 1/2 TABLET BY MOUTH TWICE DAILY. What changed:  See the new instructions.   budesonide-formoterol 160-4.5 MCG/ACT inhaler Commonly known as:  SYMBICORT Take 2 puffs first thing in am and then another 2 puffs about 12 hours later. What changed:  how much to take  how to take this  when to take this  additional instructions   cloNIDine 0.1 MG tablet Commonly known as:  CATAPRES Take 0.1 mg by mouth 2 (two) times daily.   docusate sodium 100 MG capsule Commonly known as:  COLACE 1 tab 2 times a day while on  narcotics.  STOOL SOFTENER   glipiZIDE 5 MG 24 hr tablet Commonly known as:  GLUCOTROL XL Take 5 mg by mouth daily before breakfast.   INVOKANA 300 MG Tabs tablet Generic drug:  canagliflozin Take 300 mg by mouth daily before breakfast.   levothyroxine 150 MCG tablet Commonly known as:  SYNTHROID, LEVOTHROID Take 150 mcg by mouth daily before breakfast.   losartan-hydrochlorothiazide 100-25 MG tablet Commonly known as:  HYZAAR Take 1 tablet by mouth daily.   metFORMIN 500 MG 24  hr tablet Commonly known as:  GLUCOPHAGE-XR Take 500 mg by mouth at bedtime.   multivitamin tablet Take 1 tablet by mouth daily.   omeprazole 20 MG tablet Commonly known as:  PRILOSEC OTC Take 20 mg by mouth daily as needed.   oxyCODONE 5 MG immediate release tablet Commonly known as:  Oxy IR/ROXICODONE 1-2 tablets every 4-6 hrs as needed for pain   rivaroxaban 10 MG Tabs tablet Commonly known as:  XARELTO Take 1 tablet (10 mg total) by mouth daily with breakfast. What changed:  medication strength  See the new instructions.   simvastatin 20 MG tablet Commonly known as:  ZOCOR Take 20 mg by mouth at bedtime.            Discharge Care Instructions        Start     Ordered   08/29/17 0000  rivaroxaban (XARELTO) 10 MG TABS tablet  Daily with breakfast    Question:  Supervising Provider  Answer:  Elsie Saas   08/28/17 1308   08/28/17 0000  docusate sodium (COLACE) 100 MG capsule    Question:  Supervising Provider  Answer:  Elsie Saas   08/28/17 1308   08/28/17 0000  oxyCODONE (OXY IR/ROXICODONE) 5 MG immediate release tablet    Question:  Supervising Provider  Answer:  Elsie Saas   08/28/17 1308   08/28/17 0000  Call MD / Call 911    Comments:  If you experience chest pain or shortness of breath, CALL 911 and be transported to the hospital emergency room.  If you develope a fever above 101 F, pus (white drainage) or increased drainage or redness at the wound, or calf  pain, call your surgeon's office.   08/28/17 1308   08/28/17 0000  Discharge instructions    Comments:  INSTRUCTIONS AFTER JOINT REPLACEMENT   Remove items at home which could result in a fall. This includes throw rugs or furniture in walking pathways ICE to the affected joint every three hours while awake for 30 minutes at a time, for at least the first 3-5 days, and then as needed for pain and swelling.  Continue to use ice for pain and swelling. You may notice swelling that will progress down to the foot and ankle.  This is normal after surgery.  Elevate your leg when you are not up walking on it.   Continue to use the breathing machine you got in the hospital (incentive spirometer) which will help keep your temperature down.  It is common for your temperature to cycle up and down following surgery, especially at night when you are not up moving around and exerting yourself.  The breathing machine keeps your lungs expanded and your temperature down.   DIET:  As you were doing prior to hospitalization, we recommend a well-balanced diet.  DRESSING / WOUND CARE / SHOWERING  Keep the surgical dressing until follow up.  The dressing is water proof, so you can shower without any extra covering.  IF THE DRESSING FALLS OFF or the wound gets wet inside, change the dressing with sterile gauze.  Please use good hand washing techniques before changing the dressing.  Do not use any lotions or creams on the incision until instructed by your surgeon.    ACTIVITY  Increase activity slowly as tolerated, but follow the weight bearing instructions below.   No driving for 6 weeks or until further direction given by your physician.  You cannot drive while taking narcotics.  No lifting or  carrying greater than 10 lbs. until further directed by your surgeon. Avoid periods of inactivity such as sitting longer than an hour when not asleep. This helps prevent blood clots.  You may return to work once you are  authorized by your doctor.     WEIGHT BEARING   Weight bearing as tolerated with assist device (walker, cane, etc) as directed, use it as long as suggested by your surgeon or therapist, typically at least 2-3 weeks.   EXERCISES  Results after joint replacement surgery are often greatly improved when you follow the exercise, range of motion and muscle strengthening exercises prescribed by your doctor. Safety measures are also important to protect the joint from further injury. Any time any of these exercises cause you to have increased pain or swelling, decrease what you are doing until you are comfortable again and then slowly increase them. If you have problems or questions, call your caregiver or physical therapist for advice.   Rehabilitation is important following a joint replacement. After just a few days of immobilization, the muscles of the leg can become weakened and shrink (atrophy).  These exercises are designed to build up the tone and strength of the thigh and leg muscles and to improve motion. Often times heat used for twenty to thirty minutes before working out will loosen up your tissues and help with improving the range of motion but do not use heat for the first two weeks following surgery (sometimes heat can increase post-operative swelling).   These exercises can be done on a training (exercise) mat, on the floor, on a table or on a bed. Use whatever works the best and is most comfortable for you.    Use music or television while you are exercising so that the exercises are a pleasant break in your day. This will make your life better with the exercises acting as a break in your routine that you can look forward to.   Perform all exercises about fifteen times, three times per day or as directed.  You should exercise both the operative leg and the other leg as well.   Exercises include:  Quad Sets - Tighten up the muscle on the front of the thigh (Quad) and hold for 5-10 seconds.    Straight Leg Raises - With your knee straight (if you were given a brace, keep it on), lift the leg to 60 degrees, hold for 3 seconds, and slowly lower the leg.  Perform this exercise against resistance later as your leg gets stronger.  Leg Slides: Lying on your back, slowly slide your foot toward your buttocks, bending your knee up off the floor (only go as far as is comfortable). Then slowly slide your foot back down until your leg is flat on the floor again.  Angel Wings: Lying on your back spread your legs to the side as far apart as you can without causing discomfort.  Hamstring Strength:  Lying on your back, push your heel against the floor with your leg straight by tightening up the muscles of your buttocks.  Repeat, but this time bend your knee to a comfortable angle, and push your heel against the floor.  You may put a pillow under the heel to make it more comfortable if necessary.   A rehabilitation program following joint replacement surgery can speed recovery and prevent re-injury in the future due to weakened muscles. Contact your doctor or a physical therapist for more information on knee rehabilitation.    CONSTIPATION  Constipation is defined medically as fewer than three stools per week and severe constipation as less than one stool per week.  Even if you have a regular bowel pattern at home, your normal regimen is likely to be disrupted due to multiple reasons following surgery.  Combination of anesthesia, postoperative narcotics, change in appetite and fluid intake all can affect your bowels.   YOU MUST use at least one of the following options; they are listed in order of increasing strength to get the job done.  They are all available over the counter, and you may need to use some, POSSIBLY even all of these options:    Drink plenty of fluids (prune juice may be helpful) and high fiber foods Colace 100 mg by mouth twice a day  Senokot for constipation as directed and as needed  Dulcolax (bisacodyl), take with full glass of water  Miralax (polyethylene glycol) once or twice a day as needed.  If you have tried all these things and are unable to have a bowel movement in the first 3-4 days after surgery call either your surgeon or your primary doctor.    If you experience loose stools or diarrhea, hold the medications until you stool forms back up.  If your symptoms do not get better within 1 week or if they get worse, check with your doctor.  If you experience "the worst abdominal pain ever" or develop nausea or vomiting, please contact the office immediately for further recommendations for treatment.   ITCHING:  If you experience itching with your medications, try taking only a single pain pill, or even half a pain pill at a time.  You can also use Benadryl over the counter for itching or also to help with sleep.   TED HOSE STOCKINGS:  Use stockings on both legs until for at least 2 weeks or as directed by physician office. They may be removed at night for sleeping.  MEDICATIONS:  See your medication summary on the "After Visit Summary" that nursing will review with you.  You may have some home medications which will be placed on hold until you complete the course of blood thinner medication.  It is important for you to complete the blood thinner medication as prescribed.  PRECAUTIONS:  If you experience chest pain or shortness of breath - call 911 immediately for transfer to the hospital emergency department.   If you develop a fever greater that 101 F, purulent drainage from wound, increased redness or drainage from wound, foul odor from the wound/dressing, or calf pain - CONTACT YOUR SURGEON.                                                   FOLLOW-UP APPOINTMENTS:  If you do not already have a post-op appointment, please call the office for an appointment to be seen by your surgeon.  Guidelines for how soon to be seen are listed in your "After Visit Summary", but are  typically between 1-4 weeks after surgery.  OTHER INSTRUCTIONS:   Knee Replacement:  Do not place pillow under knee, focus on keeping the knee straight while resting. CPM instructions: 0-90 degrees, 2 hours in the morning, 2 hours in the afternoon, and 2 hours in the evening. Place foam block, curve side up under heel at all times except when in CPM or when walking.  DO NOT modify, tear, cut, or change the foam block in any way.  MAKE SURE YOU:  Understand these instructions.  Get help right away if you are not doing well or get worse.    Thank you for letting us be a part of your medical care team.  It is a privilege we respect greatly.  We hope these instructions will help you stay on track for a fast and full recovery!   08/28/17 1308   08/28/17 0000  Diet - low sodium heart healthy     08/28/17 1308   08/28/17 0000  Constipation Prevention    Comments:  Drink plenty of fluids.  Prune juice may be helpful.  You may use a stool softener, such as Colace (over the counter) 100 mg twice a day.  Use MiraLax (over the counter) for constipation as needed.   08/28/17 1308   08/28/17 0000  Increase activity slowly as tolerated     08/28/17 1308   08/28/17 0000  Patient may shower    Comments:  Aquacel dressing is water proof    Wash over it and the whole leg with soap and water at the end of your shower   08/28/17 1308   08/28/17 0000  CPM    Comments:  Continuous passive motion machine (CPM):      Use the CPM from 0 to 90 for 6 hours per day.       You may break it up into 2 or 3 sessions per day.      Use CPM for 2 weeks or until you are told to stop.   08/28/17 1308   08/28/17 0000  TED hose    Comments:  Use stockings (TED hose) for 2 weeks on both leg(s).  You may remove them at night for sleeping.   08/28/17 1308   08/28/17 0000  Do not put a pillow under the knee. Place it under the heel.    Comments:  Place gray foam block, curve side up under heel at all times except when in CPM  or when walking.  DO NOT modify, tear, cut, or change in any way the gray foam block.   08/28/17 1308   08/28/17 0000  Change dressing    Comments:  DO NOT REMOVE BANDAGE OVER SURGICAL INCISION.  [place] WHOLE LEG INCLUDING OVER THE WATERPROOF BANDAGE WITH SOAP AND WATER EVERY DAY.   08/28/17 1308      Diagnostic Studies: No results found.  Disposition: 01-Home or Self Care  Discharge Instructions    CPM    Complete by:  As directed    Continuous passive motion machine (CPM):      Use the CPM from 0 to 90 for 6 hours per day.       You may break it up into 2 or 3 sessions per day.      Use CPM for 2 weeks or until you are told to stop.   Call MD / Call 911    Complete by:  As directed    If you experience chest pain or shortness of breath, CALL 911 and be transported to the hospital emergency room.  If you develope a fever above 101 F, pus (white drainage) or increased drainage or redness at the wound, or calf pain, call your surgeon's office.   Change dressing    Complete by:  As directed    DO NOT REMOVE BANDAGE OVER SURGICAL INCISION.  [place] WHOLE LEG INCLUDING OVER THE  WATERPROOF BANDAGE WITH SOAP AND WATER EVERY DAY.   Constipation Prevention    Complete by:  As directed    Drink plenty of fluids.  Prune juice may be helpful.  You may use a stool softener, such as Colace (over the counter) 100 mg twice a day.  Use MiraLax (over the counter) for constipation as needed.   Diet - low sodium heart healthy    Complete by:  As directed    Discharge instructions    Complete by:  As directed    INSTRUCTIONS AFTER JOINT REPLACEMENT   Remove items at home which could result in a fall. This includes throw rugs or furniture in walking pathways ICE to the affected joint every three hours while awake for 30 minutes at a time, for at least the first 3-5 days, and then as needed for pain and swelling.  Continue to use ice for pain and swelling. You may notice swelling that will progress down to  the foot and ankle.  This is normal after surgery.  Elevate your leg when you are not up walking on it.   Continue to use the breathing machine you got in the hospital (incentive spirometer) which will help keep your temperature down.  It is common for your temperature to cycle up and down following surgery, especially at night when you are not up moving around and exerting yourself.  The breathing machine keeps your lungs expanded and your temperature down.   DIET:  As you were doing prior to hospitalization, we recommend a well-balanced diet.  DRESSING / WOUND CARE / SHOWERING  Keep the surgical dressing until follow up.  The dressing is water proof, so you can shower without any extra covering.  IF THE DRESSING FALLS OFF or the wound gets wet inside, change the dressing with sterile gauze.  Please use good hand washing techniques before changing the dressing.  Do not use any lotions or creams on the incision until instructed by your surgeon.    ACTIVITY  Increase activity slowly as tolerated, but follow the weight bearing instructions below.   No driving for 6 weeks or until further direction given by your physician.  You cannot drive while taking narcotics.  No lifting or carrying greater than 10 lbs. until further directed by your surgeon. Avoid periods of inactivity such as sitting longer than an hour when not asleep. This helps prevent blood clots.  You may return to work once you are authorized by your doctor.     WEIGHT BEARING   Weight bearing as tolerated with assist device (walker, cane, etc) as directed, use it as long as suggested by your surgeon or therapist, typically at least 2-3 weeks.   EXERCISES  Results after joint replacement surgery are often greatly improved when you follow the exercise, range of motion and muscle strengthening exercises prescribed by your doctor. Safety measures are also important to protect the joint from further injury. Any time any of these  exercises cause you to have increased pain or swelling, decrease what you are doing until you are comfortable again and then slowly increase them. If you have problems or questions, call your caregiver or physical therapist for advice.   Rehabilitation is important following a joint replacement. After just a few days of immobilization, the muscles of the leg can become weakened and shrink (atrophy).  These exercises are designed to build up the tone and strength of the thigh and leg muscles and to improve motion. Often times heat  used for twenty to thirty minutes before working out will loosen up your tissues and help with improving the range of motion but do not use heat for the first two weeks following surgery (sometimes heat can increase post-operative swelling).   These exercises can be done on a training (exercise) mat, on the floor, on a table or on a bed. Use whatever works the best and is most comfortable for you.    Use music or television while you are exercising so that the exercises are a pleasant break in your day. This will make your life better with the exercises acting as a break in your routine that you can look forward to.   Perform all exercises about fifteen times, three times per day or as directed.  You should exercise both the operative leg and the other leg as well.   Exercises include:  Quad Sets - Tighten up the muscle on the front of the thigh (Quad) and hold for 5-10 seconds.   Straight Leg Raises - With your knee straight (if you were given a brace, keep it on), lift the leg to 60 degrees, hold for 3 seconds, and slowly lower the leg.  Perform this exercise against resistance later as your leg gets stronger.  Leg Slides: Lying on your back, slowly slide your foot toward your buttocks, bending your knee up off the floor (only go as far as is comfortable). Then slowly slide your foot back down until your leg is flat on the floor again.  Angel Wings: Lying on your back spread  your legs to the side as far apart as you can without causing discomfort.  Hamstring Strength:  Lying on your back, push your heel against the floor with your leg straight by tightening up the muscles of your buttocks.  Repeat, but this time bend your knee to a comfortable angle, and push your heel against the floor.  You may put a pillow under the heel to make it more comfortable if necessary.   A rehabilitation program following joint replacement surgery can speed recovery and prevent re-injury in the future due to weakened muscles. Contact your doctor or a physical therapist for more information on knee rehabilitation.    CONSTIPATION  Constipation is defined medically as fewer than three stools per week and severe constipation as less than one stool per week.  Even if you have a regular bowel pattern at home, your normal regimen is likely to be disrupted due to multiple reasons following surgery.  Combination of anesthesia, postoperative narcotics, change in appetite and fluid intake all can affect your bowels.   YOU MUST use at least one of the following options; they are listed in order of increasing strength to get the job done.  They are all available over the counter, and you may need to use some, POSSIBLY even all of these options:    Drink plenty of fluids (prune juice may be helpful) and high fiber foods Colace 100 mg by mouth twice a day  Senokot for constipation as directed and as needed Dulcolax (bisacodyl), take with full glass of water  Miralax (polyethylene glycol) once or twice a day as needed.  If you have tried all these things and are unable to have a bowel movement in the first 3-4 days after surgery call either your surgeon or your primary doctor.    If you experience loose stools or diarrhea, hold the medications until you stool forms back up.  If your symptoms do  not get better within 1 week or if they get worse, check with your doctor.  If you experience "the worst  abdominal pain ever" or develop nausea or vomiting, please contact the office immediately for further recommendations for treatment.   ITCHING:  If you experience itching with your medications, try taking only a single pain pill, or even half a pain pill at a time.  You can also use Benadryl over the counter for itching or also to help with sleep.   TED HOSE STOCKINGS:  Use stockings on both legs until for at least 2 weeks or as directed by physician office. They may be removed at night for sleeping.  MEDICATIONS:  See your medication summary on the "After Visit Summary" that nursing will review with you.  You may have some home medications which will be placed on hold until you complete the course of blood thinner medication.  It is important for you to complete the blood thinner medication as prescribed.  PRECAUTIONS:  If you experience chest pain or shortness of breath - call 911 immediately for transfer to the hospital emergency department.   If you develop a fever greater that 101 F, purulent drainage from wound, increased redness or drainage from wound, foul odor from the wound/dressing, or calf pain - CONTACT YOUR SURGEON.                                                   FOLLOW-UP APPOINTMENTS:  If you do not already have a post-op appointment, please call the office for an appointment to be seen by your surgeon.  Guidelines for how soon to be seen are listed in your "After Visit Summary", but are typically between 1-4 weeks after surgery.  OTHER INSTRUCTIONS:   Knee Replacement:  Do not place pillow under knee, focus on keeping the knee straight while resting. CPM instructions: 0-90 degrees, 2 hours in the morning, 2 hours in the afternoon, and 2 hours in the evening. Place foam block, curve side up under heel at all times except when in CPM or when walking.  DO NOT modify, tear, cut, or change the foam block in any way.  MAKE SURE YOU:  Understand these instructions.  Get help right  away if you are not doing well or get worse.    Thank you for letting us be a part of your medical care team.  It is a privilege we respect greatly.  We hope these instructions will help you stay on track for a fast and full recovery!   Do not put a pillow under the knee. Place it under the heel.    Complete by:  As directed    Place gray foam block, curve side up under heel at all times except when in CPM or when walking.  DO NOT modify, tear, cut, or change in any way the gray foam block.   Increase activity slowly as tolerated    Complete by:  As directed    Patient may shower    Complete by:  As directed    Aquacel dressing is water proof    Wash over it and the whole leg with soap and water at the end of your shower   TED hose    Complete by:  As directed    Use stockings (TED hose) for 2 weeks  on both leg(s).  You may remove them at night for sleeping.      Follow-up Information    [facility] Follow up.   Specialty:  [facility] Contact information: [place] [address] [phone]        Elsie Saas, MD Follow up on 09/07/2017.   Specialty:  Orthopedic Surgery Why:  as scheduled Contact information: [place] [address] [phone]            Signed: Linda Hedges 08/28/2017, 1:08 PM

## 2017-08-28 NOTE — Care Management Note (Addendum)
Cla[facility] Management Note  Patient Details  Name: Clarence Dawson MRN: [id number] Date of Birth: March 25, 1946  Subjective/Objective:                 Patient to DC to home today. [place] set up [facility] pta, has DME, CPM to be delivered to his home today or in AM. No other CM needs.    Action/Plan:  MD paged to enter F2F.   Expected Discharge Date:                  Expected Discharge Plan:  [place]  In-House Referral:     Discharge planning Services  CM Consult  Post Acute Care Choice:  Durable Medical Equipment, Home Health Choice offered to:  Patient  DME Arranged:    DME Agency:     HH Arranged:  PT [place]:  [facility] home [facility]  Status of Service:  Completed, signed off  If discussed at [place] of Stay Meetings, dates discussed:    Additional Comments:  Carles Collet, RN 08/28/2017, 10:49 AM

## 2017-08-29 DIAGNOSIS — Z471 Aftercare following joint replacement surgery: Secondary | ICD-10-CM | POA: Diagnosis not present

## 2017-08-29 DIAGNOSIS — Z96652 Presence of left artificial knee joint: Secondary | ICD-10-CM | POA: Diagnosis not present

## 2017-08-29 DIAGNOSIS — J45909 Unspecified asthma, uncomplicated: Secondary | ICD-10-CM | POA: Diagnosis not present

## 2017-08-29 DIAGNOSIS — Z79891 Long term (current) use of opiate analgesic: Secondary | ICD-10-CM | POA: Diagnosis not present

## 2017-08-29 DIAGNOSIS — I481 Persistent atrial fibrillation: Secondary | ICD-10-CM | POA: Diagnosis not present

## 2017-08-29 DIAGNOSIS — G4733 Obstructive sleep apnea (adult) (pediatric): Secondary | ICD-10-CM | POA: Diagnosis not present

## 2017-08-29 DIAGNOSIS — E119 Type 2 diabetes mellitus without complications: Secondary | ICD-10-CM | POA: Diagnosis not present

## 2017-08-29 DIAGNOSIS — I10 Essential (primary) hypertension: Secondary | ICD-10-CM | POA: Diagnosis not present

## 2017-08-29 DIAGNOSIS — M659 Synovitis and tenosynovitis, unspecified: Secondary | ICD-10-CM | POA: Diagnosis not present

## 2017-08-29 DIAGNOSIS — Z7951 Long term (current) use of inhaled steroids: Secondary | ICD-10-CM | POA: Diagnosis not present

## 2017-08-29 DIAGNOSIS — Z950 Presence of cardiac pacemaker: Secondary | ICD-10-CM | POA: Diagnosis not present

## 2017-08-29 DIAGNOSIS — Z7984 Long term (current) use of oral hypoglycemic drugs: Secondary | ICD-10-CM | POA: Diagnosis not present

## 2017-08-29 DIAGNOSIS — Z96651 Presence of right artificial knee joint: Secondary | ICD-10-CM | POA: Diagnosis not present

## 2017-08-29 DIAGNOSIS — Z87891 Personal history of nicotine dependence: Secondary | ICD-10-CM | POA: Diagnosis not present

## 2017-08-31 DIAGNOSIS — Z471 Aftercare following joint replacement surgery: Secondary | ICD-10-CM | POA: Diagnosis not present

## 2017-08-31 DIAGNOSIS — Z96651 Presence of right artificial knee joint: Secondary | ICD-10-CM | POA: Diagnosis not present

## 2017-08-31 DIAGNOSIS — M659 Synovitis and tenosynovitis, unspecified: Secondary | ICD-10-CM | POA: Diagnosis not present

## 2017-08-31 DIAGNOSIS — E119 Type 2 diabetes mellitus without complications: Secondary | ICD-10-CM | POA: Diagnosis not present

## 2017-08-31 DIAGNOSIS — I10 Essential (primary) hypertension: Secondary | ICD-10-CM | POA: Diagnosis not present

## 2017-08-31 DIAGNOSIS — J45909 Unspecified asthma, uncomplicated: Secondary | ICD-10-CM | POA: Diagnosis not present

## 2017-09-01 DIAGNOSIS — Z471 Aftercare following joint replacement surgery: Secondary | ICD-10-CM | POA: Diagnosis not present

## 2017-09-01 DIAGNOSIS — I10 Essential (primary) hypertension: Secondary | ICD-10-CM | POA: Diagnosis not present

## 2017-09-01 DIAGNOSIS — Z96651 Presence of right artificial knee joint: Secondary | ICD-10-CM | POA: Diagnosis not present

## 2017-09-01 DIAGNOSIS — E119 Type 2 diabetes mellitus without complications: Secondary | ICD-10-CM | POA: Diagnosis not present

## 2017-09-01 DIAGNOSIS — J45909 Unspecified asthma, uncomplicated: Secondary | ICD-10-CM | POA: Diagnosis not present

## 2017-09-01 DIAGNOSIS — M659 Synovitis and tenosynovitis, unspecified: Secondary | ICD-10-CM | POA: Diagnosis not present

## 2017-09-03 DIAGNOSIS — Z96651 Presence of right artificial knee joint: Secondary | ICD-10-CM | POA: Diagnosis not present

## 2017-09-03 DIAGNOSIS — M659 Synovitis and tenosynovitis, unspecified: Secondary | ICD-10-CM | POA: Diagnosis not present

## 2017-09-03 DIAGNOSIS — J45909 Unspecified asthma, uncomplicated: Secondary | ICD-10-CM | POA: Diagnosis not present

## 2017-09-03 DIAGNOSIS — I10 Essential (primary) hypertension: Secondary | ICD-10-CM | POA: Diagnosis not present

## 2017-09-03 DIAGNOSIS — E119 Type 2 diabetes mellitus without complications: Secondary | ICD-10-CM | POA: Diagnosis not present

## 2017-09-03 DIAGNOSIS — Z471 Aftercare following joint replacement surgery: Secondary | ICD-10-CM | POA: Diagnosis not present

## 2017-09-04 DIAGNOSIS — I10 Essential (primary) hypertension: Secondary | ICD-10-CM | POA: Diagnosis not present

## 2017-09-04 DIAGNOSIS — Z471 Aftercare following joint replacement surgery: Secondary | ICD-10-CM | POA: Diagnosis not present

## 2017-09-04 DIAGNOSIS — E119 Type 2 diabetes mellitus without complications: Secondary | ICD-10-CM | POA: Diagnosis not present

## 2017-09-04 DIAGNOSIS — J45909 Unspecified asthma, uncomplicated: Secondary | ICD-10-CM | POA: Diagnosis not present

## 2017-09-04 DIAGNOSIS — M659 Synovitis and tenosynovitis, unspecified: Secondary | ICD-10-CM | POA: Diagnosis not present

## 2017-09-04 DIAGNOSIS — Z96651 Presence of right artificial knee joint: Secondary | ICD-10-CM | POA: Diagnosis not present

## 2017-09-07 ENCOUNTER — Ambulatory Visit (HOSPITAL_COMMUNITY): Payer: Medicare Other | Attending: Orthopedic Surgery | Admitting: Physical Therapy

## 2017-09-07 ENCOUNTER — Encounter (HOSPITAL_COMMUNITY): Payer: Self-pay | Admitting: Physical Therapy

## 2017-09-07 DIAGNOSIS — R262 Difficulty in walking, not elsewhere classified: Secondary | ICD-10-CM | POA: Diagnosis not present

## 2017-09-07 DIAGNOSIS — R600 Localized edema: Secondary | ICD-10-CM | POA: Diagnosis not present

## 2017-09-07 DIAGNOSIS — R2681 Unsteadiness on feet: Secondary | ICD-10-CM

## 2017-09-07 DIAGNOSIS — M1711 Unilateral primary osteoarthritis, right knee: Secondary | ICD-10-CM | POA: Diagnosis not present

## 2017-09-07 DIAGNOSIS — M25661 Stiffness of right knee, not elsewhere classified: Secondary | ICD-10-CM | POA: Diagnosis not present

## 2017-09-07 NOTE — Therapy (Signed)
[facility] [address] [place], [place], [address] Phone: [phone]   Fax:  [phone]  Physical Therapy Evaluation  Patient Details  Name: Clarence Dawson MRN: [id number] Date of Birth: 1946-06-03 Referring Provider: Elsie Saas   Encounter Date: 09/07/2017      PT End of Session - 09/07/17 1206    Visit Number 1   Number of Visits 19   Date for PT Re-Evaluation 09/28/17   Authorization Type Medicare/[employer] Indemnity    Authorization Time Period 09/07/17 to 10/19/17   Authorization - Visit Number 1   Authorization - Number of Visits 10   PT Start Time 5885   PT Stop Time 1110   PT Time Calculation (min) 38 min   Activity Tolerance Patient tolerated treatment well   Behavior During Therapy [facility] for tasks assessed/performed      Past Medical History:  Diagnosis Date  . Allergic rhinitis   . Aortic valve disorder   . Asthma    since childhood- seasonal allergies induced  . Cancer ([place])    Skin cancer- squamous, basal  . Carotid artery stenosis   . Essential hypertension   . Full dentures   . GERD (gastroesophageal reflux disease)   . H/O hiatal hernia   . Hemorrhage of rectum   . Hyperlipidemia   . Hypothyroidism   . 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 OSA (obstructive sleep apnea)   . Osteoarthritis   . Pacemaker    Oct 2005 in [place].  Marland Kitchen PAF (paroxysmal atrial fibrillation) ([place])   . Pneumonia    "several Times" 2015 last time  . Primary localized osteoarthritis of right knee 08/11/2017  . RBBB (right bundle branch block)   . Sinoatrial node dysfunction (H[facility])   . Syncope   . Tricuspid valve disorder   . Type 2 diabetes mellitus ([place])    Type II    Past Surgical History:  Procedure Laterality Date  . A-V CARDIAC PACEMAKER INSERTION     Sick sinus syndrome DDR pacer  . Arthropathy Right 2005   Rebuilding of left thumb and joint   . CARDIAC CATHETERIZATION    . CARDIAC ELECTROPHYSIOLOGY STUDY AND ABLATION  09/2008    for pvcs, Dr. Loralie Champagne  . CARDIOVERSION N/A 12/18/2016   Procedure: CARDIOVERSION;  Surgeon: Pixie Casino, MD;  Location: [facility] ENDOSCOPY;  Service: Cardiovascular;  Laterality: N/A;  . [place]   right wrist  . CARPAL TUNNEL RELEASE  05/04/2012   Procedure: CARPAL TUNNEL RELEASE;  Surgeon: Wynonia Sours, MD;  Location: [facility];  Service: Orthopedics;  Laterality: Left;  . CARPOMETACARPEL SUSPENSION PLASTY Right 11/16/2014   Procedure: SUSPENSIONPLASTY RIGHT THUMB TENDON TRANSFER ABDUCTOR POLLICUS LONGUS EXCISION TRAPEZIUM;  Surgeon: Daryll Brod, MD;  Location: [facility];  Service: Orthopedics;  Laterality: Right;  . CHOLECYSTECTOMY  1994  . CIRCUMCISION    . COLONOSCOPY N/A 03/14/2013   Procedure: COLONOSCOPY;  Surgeon: Daneil Dolin, MD;  [facility];  Service: Endoscopy;  Laterality: N/A;  8:15 AM  . 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 EYE SURGERY  2012   Left eye Corneal transplant- partial- Cataract  . GALLBLADDER SURGERY  12/01/2006  . HAND TENDON SURGERY Left late 1990's   thumb  . HEMORROIDECTOMY  2003  . INJECTION KNEE Left 08/26/2017   Procedure: LEFT KNEE INJECTION;  Surgeon: Elsie Saas, MD;  Location: [facility];  Service: Orthopedics;  Laterality: Left;  . left Knee Arthroscopy     [facility]  . PARTIAL KNEE ARTHROPLASTY  11/22/2012   Procedure: UNICOMPARTMENTAL KNEE;  Surgeon: Lorn Junes, MD;  Location: [facility];  Service: Orthopedics;  Laterality: Left;  left unicompartmental knee arthroplasty  . PERMANENT PACEMAKER GENERATOR CHANGE N/A 01/12/2013   Procedure: PERMANENT PACEMAKER GENERATOR CHANGE;  Surgeon: Evans Lance, MD;  [facility] CATH LAB;  Service: Cardiovascular;  Laterality: N/A;  . Rotator cuff Surgery  2001   Right shoulder  . TONSILLECTOMY    . TOTAL KNEE ARTHROPLASTY Right 08/26/2017   Procedure: TOTAL KNEE ARTHROPLASTY;  Surgeon: Elsie Saas,  MD;  Location: [facility];  Service: Orthopedics;  Laterality: Right;    There were no vitals filed for this visit.       Subjective Assessment - 09/07/17 1034    Subjective Patient arrives after having a R TKR done on August 29th with Dr. Noemi Chapel; he had HHPT and has been discharged. He states he is "a klutz" and drops his walker multiple times. Right now working on knee extension is difficult, as is bending his knee. His balance has been alright, he has not noticed any close calls since surgery. [place] has to walk outside quite a bit [place]    Pertinent History HTN, hx PVCs, R BBB, heart valve disorders, OSA, DM, Pacemaker (recent disfunction but MD has repaired and is monitoring), L partial knee, R knee replacement 08/26/17)   How long can you sit comfortably? 30 minutes    How long can you stand comfortably? 10-15 minutes at best    How long can you walk comfortably? 5-10 minutes with walker or cane    Patient Stated Goals be able to go on vacation and walk the beach    Currently in Pain? Yes  at worst 8/10   Pain Score 2    Pain Location Knee   Pain Orientation Right   Pain Descriptors / Indicators Aching;Throbbing   Pain Type Surgical pain   Pain Radiating Towards none    Pain Onset 1 to 4 weeks ago   Pain Frequency Constant   Aggravating Factors  doing too much    Pain Relieving Factors medicines, ice, rest    Effect of Pain on Daily Activities moderate-severe             [facility] PT Assessment - 09/07/17 0001      Assessment   Medical Diagnosis R TKR    Referring Provider Elsie Saas    Onset Date/Surgical Date 08/26/17   Next MD Visit Dr. Noemi Chapel today    Prior Therapy HHPT but has been discharged      Precautions   Precautions None     Restrictions   Weight Bearing Restrictions No  WBAT      Balance Screen   Has the patient fallen in the past 6 months No   Has the patient had a decrease in activity level because of a fear of falling?  No   Is the  patient reluctant to leave their home because of a fear of falling?  No     Prior Function   Level of Independence Independent;Independent with basic ADLs;Independent with gait;Independent with transfers   Vocation Retired     ROM / Strength   AROM / PROM / Strength AROM     AROM   Right Knee Extension 4   Right Knee Flexion 80  Strength   Right Hip Flexion 5/5   Right Hip ABduction 4+/5   Left Hip Flexion 5/5   Left Hip ABduction 4/5   Right Knee Flexion 3-/5  ROM limited    Right Knee Extension 5/5  ROM limited    Left Knee Flexion 5/5   Left Knee Extension 5/5   Right Ankle Dorsiflexion 5/5   Left Ankle Dorsiflexion 5/5     Ambulation/Gait   Gait Comments reduced ROM R knee, flexed at hips      6 minute walk test results    Aerobic Endurance Distance Walked 621   Endurance additional comments 3MWT  walker      High Level Balance   High Level Balance Comments TUG 12.85 with walker             Objective measurements completed on examination: See above findings.                  PT Education - 09/07/17 1206    Education provided Yes   Education Details prognosis, exam findings, POC   Person(s) Educated Patient   Methods Explanation   Comprehension Verbalized understanding          PT Short Term Goals - 09/07/17 1212      PT SHORT TERM GOAL #1   Title Patient to demonstrate R knee ROM as being 0-115 in order to improve gait mechanics and overall mobility    Time 3   Period Weeks   Status New   Target Date 09/28/17     PT SHORT TERM GOAL #2   Title Patient to be consistently ambulating with SPC, consistent heel-toe mechanics and minimal unsteadiness, in order to show improved balance and general mobility    Time 3   Period Weeks   Status New     PT SHORT TERM GOAL #3   Title Patient to be able to complete sit to stand with no UEs and no weight shift to L LE in order to show improved proprioception and strength in surgical limb     Time 3   Period Weeks   Status New     PT SHORT TERM GOAL #4   Title Patient to correctly and consistently perform appropriate HEP, to be updated PRN    Time 1   Period Weeks   Status New   Target Date 09/14/17           PT Long Term Goals - 09/07/17 1214      PT LONG TERM GOAL #1   Title Patient to demonstrate strength 5/5 in all tested muscle groups in order to assist in maintaining correct functional posture and reducing pain    Time 6   Period Weeks   Status New   Target Date 10/19/17     PT LONG TERM GOAL #2   Title Patient to be able to ambulate over level surfaces with no device and unsteady/uneven surfaces with cane, correct heel-toe pattern and minimal unsteadiness, in order to improve mobiilty and improved home/community access    Time 6   Period Weeks   Status New     PT LONG TERM GOAL #3   Title Patient to be able to complete TUG in 12 seconds or less with no device and maintain SLS for at least 15 seconds on level surface in order to demonstrate improved functional balance    Time 6   Period Weeks   Status New     PT LONG  TERM GOAL #4   Title Patient to reciprocally ascend/descend stairs with U railing, minimal unsteadiness, good eccentric control in order to improve home/community access    Time 6   Period Weeks   Status New                Plan - 09/07/17 1207    Clinical Impression Statement Patient arrives after having R TKR surgery done on 08/26/17 by Dr. Noemi Chapel; he states that everything is going well and his main concerns right now are his ROM as well as being able to walk on the beach for vacation eventually. Patient arrives with walker, which appears too short for him; attempted to raise walker but unable due to limitations of device (no further slots available). Patient very pleasant but talkative during evaluation, limiting ability to assign HEP. Examination reveals typical presentation following TKR surgery including limited ROM,  localized edema, gait deviation, impaired functional balance, and very mild strength limitations. Recommend skilled PT services in order to address functional deficits and assist in return to optimal level of function.    History and Personal Factors relevant to plan of care: post-op state    Clinical Presentation Stable   Clinical Presentation due to: post-surgical state    Clinical Decision Making Low   Rehab Potential Excellent   Clinical Impairments Affecting Rehab Potential (+) motivated to participate, success with PT in the past   PT Frequency 3x / week   PT Duration 6 weeks   PT Treatment/Interventions ADLs/Self Care Home Management;Cryotherapy;Moist Heat;DME Instruction;Gait training;Stair training;Functional mobility training;Therapeutic activities;Therapeutic exercise;Balance training;Neuromuscular re-education;Patient/family education;Manual techniques;Scar mobilization;Passive range of motion;Dry needling;Taping   PT Next Visit Plan review initial eval/goals; assign ROM based HEP (unable at eval due to time limitations); focus on ROM especially flexion. Gait training with cane over unsteady surfaces so we can get awy from low walker.    PT Home Exercise Plan assign 2nd session    Consulted and Agree with Plan of Care Patient      Patient will benefit from skilled therapeutic intervention in order to improve the following deficits and impairments:  Abnormal gait, Decreased skin integrity, Pain, Improper body mechanics, Decreased coordination, Decreased mobility, Decreased scar mobility, Increased muscle spasms, Postural dysfunction, Decreased activity tolerance, Decreased range of motion, Decreased strength, Hypomobility, Decreased balance, Decreased safety awareness, Difficulty walking, Impaired flexibility, Increased edema  Visit Diagnosis: Stiffness of right knee, not elsewhere classified - Plan: PT plan of care cert/re-cert  Localized edema - Plan: PT plan of care  cert/re-cert  Difficulty in walking, not elsewhere classified - Plan: PT plan of care cert/re-cert  Unsteadiness on feet - Plan: PT plan of care cert/re-cert      G-Codes - 15/17/61 1216    Functional Assessment Tool Used (Outpatient Only) Based on skilled clinical assessment of ROM, strength, gait, balance   Functional Limitation Mobility: Walking and moving around   Mobility: Walking and Moving Around Current Status (Y0737) At least 40 percent but less than 60 percent impaired, limited or restricted   Mobility: Walking and Moving Around Goal Status [phone]) At least 20 percent but less than 40 percent impaired, limited or restricted       Problem List Patient Active Problem List   Diagnosis Date Noted  . Primary localized osteoarthritis of right knee 08/11/2017  . Asthma with acute exacerbation 12/08/2016  . Chest pain 10/20/2016  . HTN (hypertension) 10/20/2016  . Morbid obesity due to excess calories ([place]) 06/24/2016  . Cough variant asthma 02/12/2016  .  Upper airway cough syndrome 01/24/2016  . 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 Essential hypertension   . Persistent atrial fibrillation ([place])   . Syncope   . Hyperthyroidism   . Hyperlipidemia   . RBBB (right bundle branch block)   . Tricuspid valve disorder   . Aortic valve disorder   . Carotid artery stenosis   . Diabetes ([place])   . Pacemaker   . OSA (obstructive sleep apnea)   . Arthritis   . Sinoatrial node dysfunction (H[facility])   . PVC's (premature ventricular contractions) 07/17/2011  . Essential hypertension, benign 01/24/2011  . Premature ventricular contractions 01/24/2011  . PPM-[place] 01/24/2011    Deniece Ree PT, DPT [phone]  [facility] [address] [place], [place], [address] Phone: [phone]   Fax:  [phone]  Name: Clarence Dawson MRN: [id number] Date of Birth: Mar 09, 1946

## 2017-09-09 ENCOUNTER — Encounter (HOSPITAL_COMMUNITY): Payer: Self-pay | Admitting: Physical Therapy

## 2017-09-09 ENCOUNTER — Ambulatory Visit (HOSPITAL_COMMUNITY): Payer: Medicare Other | Admitting: Physical Therapy

## 2017-09-09 DIAGNOSIS — M25661 Stiffness of right knee, not elsewhere classified: Secondary | ICD-10-CM

## 2017-09-09 DIAGNOSIS — R2681 Unsteadiness on feet: Secondary | ICD-10-CM

## 2017-09-09 DIAGNOSIS — R262 Difficulty in walking, not elsewhere classified: Secondary | ICD-10-CM

## 2017-09-09 DIAGNOSIS — R600 Localized edema: Secondary | ICD-10-CM

## 2017-09-09 NOTE — Therapy (Signed)
[place], [place], [address] Phone: [phone]   Fax:  [phone]  Physical Therapy Treatment  Patient Details  Name: TINY RIETZ MRN: [id number] Date of Birth: 01/20/1946 Referring Provider: Elsie Saas   Encounter Date: 09/09/2017      PT End of Session - 09/09/17 1527    Visit Number 2   Number of Visits 19   Date for PT Re-Evaluation 09/28/17   Authorization Type Medicare/[employer] Indemnity    Authorization Time Period 09/07/17 to 10/19/17   Authorization - Visit Number 2   Authorization - Number of Visits 10   PT Start Time 1433   PT Stop Time 1519   PT Time Calculation (min) 46 min   Activity Tolerance Patient tolerated treatment well   Behavior During Therapy [facility] for tasks assessed/performed      Past Medical History:  Diagnosis Date  . Allergic rhinitis   . Aortic valve disorder   . Asthma    since childhood- seasonal allergies induced  . Cancer ([place])    Skin cancer- squamous, basal  . Carotid artery stenosis   . Essential hypertension   . Full dentures   . GERD (gastroesophageal reflux disease)   . H/O hiatal hernia   . Hemorrhage of rectum   . Hyperlipidemia   . Hypothyroidism   . 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 OSA (obstructive sleep apnea)   . Osteoarthritis   . Pacemaker    Oct 2005 in [place].  Marland Kitchen PAF (paroxysmal atrial fibrillation) ([place])   . Pneumonia    "several Times" 2015 last time  . Primary localized osteoarthritis of right knee 08/11/2017  . RBBB (right bundle branch block)   . Sinoatrial node dysfunction (HCC)   . Syncope   . Tricuspid valve disorder   . Type 2 diabetes mellitus ([place])    Type II    Past Surgical History:  Procedure Laterality Date  . A-V CARDIAC PACEMAKER INSERTION     Sick sinus syndrome DDR pacer  . Arthropathy Right 2005   Rebuilding of left thumb and joint   . CARDIAC CATHETERIZATION    . CARDIAC ELECTROPHYSIOLOGY STUDY AND ABLATION  09/2008   for pvcs, Dr. Loralie Champagne  . CARDIOVERSION N/A 12/18/2016   Procedure: CARDIOVERSION;  Surgeon: Pixie Casino, MD;  Location: [facility] ENDOSCOPY;  Service: Cardiovascular;  Laterality: N/A;  . [place]   right wrist  . CARPAL TUNNEL RELEASE  05/04/2012   Procedure: CARPAL TUNNEL RELEASE;  Surgeon: Wynonia Sours, MD;  Location: [place];  Service: Orthopedics;  Laterality: Left;  . CARPOMETACARPEL SUSPENSION PLASTY Right 11/16/2014   Procedure: SUSPENSIONPLASTY RIGHT THUMB TENDON TRANSFER ABDUCTOR POLLICUS LONGUS EXCISION TRAPEZIUM;  Surgeon: Daryll Brod, MD;  Location: [place];  Service: Orthopedics;  Laterality: Right;  . CHOLECYSTECTOMY  1994  . CIRCUMCISION    . COLONOSCOPY N/A 03/14/2013   Procedure: COLONOSCOPY;  Surgeon: Daneil Dolin, MD;  [facility];  Service: Endoscopy;  Laterality: N/A;  8:15 AM  . EYE SURGERY     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-[facility]  . EYE SURGERY  2012   Left eye Corneal transplant- partial- Cataract  . GALLBLADDER SURGERY  12/01/2006  . HAND TENDON SURGERY Left late 1990's   thumb  . HEMORROIDECTOMY  2003  . INJECTION KNEE Left 08/26/2017   Procedure: LEFT KNEE INJECTION;  Surgeon: Elsie Saas, MD;  Location: [place];  Service: Orthopedics;  Laterality: Left;  . left Knee Arthroscopy     [facility]  . PARTIAL KNEE ARTHROPLASTY  11/22/2012   Procedure: UNICOMPARTMENTAL KNEE;  Surgeon: Lorn Junes, MD;  Location: [place];  Service: Orthopedics;  Laterality: Left;  left unicompartmental knee arthroplasty  . PERMANENT PACEMAKER GENERATOR CHANGE N/A 01/12/2013   Procedure: PERMANENT PACEMAKER GENERATOR CHANGE;  Surgeon: Evans Lance, MD;  Location: [facility] CATH LAB;  Service: Cardiovascular;  Laterality: N/A;  . Rotator cuff Surgery  2001   Right shoulder  . TONSILLECTOMY    . TOTAL KNEE ARTHROPLASTY Right 08/26/2017   Procedure: TOTAL KNEE ARTHROPLASTY;  Surgeon: Elsie Saas,  MD;  Location: [place];  Service: Orthopedics;  Laterality: Right;    There were no vitals filed for this visit.      Subjective Assessment - 09/09/17 1436    Subjective patient arrives stating his MD told him to ditch the walker, he is now using his cane primarily. He has done a lot of walking recently.    Pertinent History HTN, hx PVCs, R BBB, heart valve disorders, OSA, DM, Pacemaker (recent disfunction but MD has repaired and is monitoring), L partial knee, R knee replacement 08/26/17)   Patient Stated Goals be able to go on vacation and walk the beach    Currently in Pain? Yes   Pain Score 4    Pain Location Knee   Pain Orientation Right            [facility] PT Assessment - 09/09/17 0001      AROM   Right Knee Flexion 89                     [facility] Adult PT Treatment/Exercise - 09/09/17 0001      Ambulation/Gait   Gait Comments reduced ROM R knee, flexed at hips; cues for heel-toe pattern      Exercises   Exercises Knee/Hip;Ankle     Knee/Hip Exercises: Stretches   Passive Hamstring Stretch 30 seconds;3 reps   Gastroc Stretch 30 seconds;3 reps   Other Knee/Hip Stretches Active knee flexion on 12 in. step 10s hold x 10 reps (89 deg last rep)     Manual Therapy   Manual Therapy Edema management;Joint mobilization   Manual therapy comments performed separate from all other servcies    Edema Management LE elevated, retrograde massage    Joint Mobilization tib on femur grade III; patella mobility grade III all directions      Bicycle seat 19 5-7 minutes for ROM, cues to reduce hip compensations            PT Education - 09/09/17 1527    Education provided Yes   Education Details initial eval/goals, HEP    Person(s) Educated Patient   Methods Explanation;Handout   Comprehension Verbalized understanding;Need further instruction          PT Short Term Goals - 09/07/17 1212      PT SHORT TERM GOAL #1   Title Patient to demonstrate R knee ROM as  being 0-115 in order to improve gait mechanics and overall mobility    Time 3   Period Weeks   Status New   Target Date 09/28/17     PT SHORT TERM GOAL #2   Title Patient to be consistently ambulating with SPC, consistent heel-toe mechanics and minimal unsteadiness, in order to show improved balance and general mobility    Time 3   Period Weeks  Status New     PT SHORT TERM GOAL #3   Title Patient to be able to complete sit to stand with no UEs and no weight shift to L LE in order to show improved proprioception and strength in surgical limb    Time 3   Period Weeks   Status New     PT SHORT TERM GOAL #4   Title Patient to correctly and consistently perform appropriate HEP, to be updated PRN    Time 1   Period Weeks   Status New   Target Date 09/14/17           PT Long Term Goals - 09/07/17 1214      PT LONG TERM GOAL #1   Title Patient to demonstrate strength 5/5 in all tested muscle groups in order to assist in maintaining correct functional posture and reducing pain    Time 6   Period Weeks   Status New   Target Date 10/19/17     PT LONG TERM GOAL #2   Title Patient to be able to ambulate over level surfaces with no device and unsteady/uneven surfaces with cane, correct heel-toe pattern and minimal unsteadiness, in order to improve mobiilty and improved home/community access    Time 6   Period Weeks   Status New     PT LONG TERM GOAL #3   Title Patient to be able to complete TUG in 12 seconds or less with no device and maintain SLS for at least 15 seconds on level surface in order to demonstrate improved functional balance    Time 6   Period Weeks   Status New     PT LONG TERM GOAL #4   Title Patient to reciprocally ascend/descend stairs with U railing, minimal unsteadiness, good eccentric control in order to improve home/community access    Time 6   Period Weeks   Status New               Plan - 09/09/17 1528    Clinical Impression Statement  Patient arrives in good spirits and reporting that he is feeling well but he did a lot of walking the past couple of days. Initiated manual including joint mobilization and edema control, also knee ROM activities and gait training. Able to reach 89 degrees flexion today however did notice some minor bleeding at distal portion of incision following manual which quickly resolved. Patient does complain of R hip pain on anterior and posterior-lateral areas of R hip which will require further investigation next session.    Rehab Potential Excellent   Clinical Impairments Affecting Rehab Potential (+) motivated to participate, success with PT in the past   PT Frequency 3x / week   PT Duration 6 weeks   PT Treatment/Interventions ADLs/Self Care Home Management;Cryotherapy;Moist Heat;DME Instruction;Gait training;Stair training;Functional mobility training;Therapeutic activities;Therapeutic exercise;Balance training;Neuromuscular re-education;Patient/family education;Manual techniques;Scar mobilization;Passive range of motion;Dry needling;Taping   PT Next Visit Plan assess R anterior/lateral/posterior hip for soft tissue limitations/muscle spasms contributing to pain; continue focus on ROM especially flexion. Gait and balance training.    PT Home Exercise Plan Eval: continuew with HHPT HEP, increase focus on quad sets, heel slides, knee flexion and extension stretches    Consulted and Agree with Plan of Care Patient      Patient will benefit from skilled therapeutic intervention in order to improve the following deficits and impairments:  Abnormal gait, Decreased skin integrity, Pain, Improper body mechanics, Decreased coordination, Decreased mobility, Decreased scar mobility, Increased  muscle spasms, Postural dysfunction, Decreased activity tolerance, Decreased range of motion, Decreased strength, Hypomobility, Decreased balance, Decreased safety awareness, Difficulty walking, Impaired flexibility, Increased  edema  Visit Diagnosis: Stiffness of right knee, not elsewhere classified  Localized edema  Difficulty in walking, not elsewhere classified  Unsteadiness on feet     Problem List Patient Active Problem List   Diagnosis Date Noted  . Primary localized osteoarthritis of right knee 08/11/2017  . Asthma with acute exacerbation 12/08/2016  . Chest pain 10/20/2016  . HTN (hypertension) 10/20/2016  . Morbid obesity due to excess calories ([place]) 06/24/2016  . Cough variant asthma 02/12/2016  . Upper airway cough syndrome 01/24/2016  . 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 Essential hypertension   . Persistent atrial fibrillation ([place])   . Syncope   . Hyperthyroidism   . Hyperlipidemia   . RBBB (right bundle branch block)   . Tricuspid valve disorder   . Aortic valve disorder   . Carotid artery stenosis   . Diabetes ([place])   . Pacemaker   . OSA (obstructive sleep apnea)   . Arthritis   . Sinoatrial node dysfunction (HCC)   . PVC's (premature ventricular contractions) 07/17/2011  . Essential hypertension, benign 01/24/2011  . Premature ventricular contractions 01/24/2011  . PPM-St.Jude 01/24/2011    Deniece Ree PT, DPT [phone]  [place] [address] [place], [place], [address] Phone: [phone]   Fax:  [phone]  Name: ZEDEKIAH HINDERMAN MRN: [id number] Date of Birth: May 20, 1946

## 2017-09-09 NOTE — Patient Instructions (Signed)
   QUAD SET  Tighten your top thigh muscle as you attempt to press the back of your knee downward towards the table.  Hold 3-5 seconds; repeat at least 20 times, at least 3 times a day.    HEEL SLIDES - SUPINE  Lying on your back with knees straight, slide the affected heel towards your buttock as you bend your knee.   Hold a gentle stretch in this position for 3-5 seconds and then return to original position.  Repeat 15-20 times, at least 3 times per day.    HEEL SLIDES - SELF ASSISTED  While seated, slide your heel towards your buttock with the assist of the unaffected leg. (your left leg should be helping to push your right leg back)  Hold for 10 seconds.  Repeat 10 times, 3 times per day.    KNEE EXTENSION STRETCH - PROPPED AND WEIGHTED  While seated, prop your foot up on another chair and allow gravity to stretch your knee towards a more straightened position. Place a weight such as a back pack, ankle weight or other item on the knee for an increased stretch.   Hold as long as you can tolerate; progress as you are able.

## 2017-09-11 ENCOUNTER — Telehealth (HOSPITAL_COMMUNITY): Payer: Self-pay

## 2017-09-11 ENCOUNTER — Ambulatory Visit (HOSPITAL_COMMUNITY): Payer: Medicare Other

## 2017-09-11 NOTE — Telephone Encounter (Signed)
Cx due to [facility]

## 2017-09-14 ENCOUNTER — Encounter (HOSPITAL_COMMUNITY): Payer: Self-pay | Admitting: Physical Therapy

## 2017-09-14 ENCOUNTER — Ambulatory Visit (HOSPITAL_COMMUNITY): Payer: Medicare Other | Admitting: Physical Therapy

## 2017-09-14 DIAGNOSIS — R262 Difficulty in walking, not elsewhere classified: Secondary | ICD-10-CM | POA: Diagnosis not present

## 2017-09-14 DIAGNOSIS — R600 Localized edema: Secondary | ICD-10-CM

## 2017-09-14 DIAGNOSIS — R2681 Unsteadiness on feet: Secondary | ICD-10-CM | POA: Diagnosis not present

## 2017-09-14 DIAGNOSIS — M25661 Stiffness of right knee, not elsewhere classified: Secondary | ICD-10-CM

## 2017-09-14 NOTE — Therapy (Signed)
[place], [place], [address] Phone: [phone]   Fax:  [phone]  Physical Therapy Treatment  Patient Details  Name: MARQUIES WANAT MRN: [id number] Date of Birth: 1946/04/17 Referring Provider: Elsie Saas   Encounter Date: 09/14/2017      PT End of Session - 09/14/17 1729    Visit Number 3   Number of Visits 19   Date for PT Re-Evaluation 09/28/17   Authorization Type Medicare/[employer] Indemnity    Authorization Time Period 09/07/17 to 10/19/17   Authorization - Visit Number 3   Authorization - Number of Visits 10   PT Start Time 7062   PT Stop Time 1726   PT Time Calculation (min) 40 min   Activity Tolerance Patient tolerated treatment well   Behavior During Therapy [facility] for tasks assessed/performed      Past Medical History:  Diagnosis Date  . Allergic rhinitis   . Aortic valve disorder   . Asthma    since childhood- seasonal allergies induced  . Cancer ([place])    Skin cancer- squamous, basal  . Carotid artery stenosis   . Essential hypertension   . Full dentures   . GERD (gastroesophageal reflux disease)   . H/O hiatal hernia   . Hemorrhage of rectum   . Hyperlipidemia   . Hypothyroidism   . 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 OSA (obstructive sleep apnea)   . Osteoarthritis   . Pacemaker    Oct 2005 in [place].  Marland Kitchen PAF (paroxysmal atrial fibrillation) ([place])   . Pneumonia    "several Times" 2015 last time  . Primary localized osteoarthritis of right knee 08/11/2017  . RBBB (right bundle branch block)   . Sinoatrial node dysfunction (HCC)   . Syncope   . Tricuspid valve disorder   . Type 2 diabetes mellitus ([place])    Type II    Past Surgical History:  Procedure Laterality Date  . A-V CARDIAC PACEMAKER INSERTION     Sick sinus syndrome DDR pacer  . Arthropathy Right 2005   Rebuilding of left thumb and joint   . CARDIAC CATHETERIZATION    . CARDIAC ELECTROPHYSIOLOGY STUDY AND ABLATION  09/2008   for pvcs, Dr. Loralie Champagne  . CARDIOVERSION N/A 12/18/2016   Procedure: CARDIOVERSION;  Surgeon: Pixie Casino, MD;  Location: [facility] ENDOSCOPY;  Service: Cardiovascular;  Laterality: N/A;  . [place]   right wrist  . CARPAL TUNNEL RELEASE  05/04/2012   Procedure: CARPAL TUNNEL RELEASE;  Surgeon: Wynonia Sours, MD;  Location: [place];  Service: Orthopedics;  Laterality: Left;  . CARPOMETACARPEL SUSPENSION PLASTY Right 11/16/2014   Procedure: SUSPENSIONPLASTY RIGHT THUMB TENDON TRANSFER ABDUCTOR POLLICUS LONGUS EXCISION TRAPEZIUM;  Surgeon: Daryll Brod, MD;  Location: [place];  Service: Orthopedics;  Laterality: Right;  . CHOLECYSTECTOMY  1994  . CIRCUMCISION    . COLONOSCOPY N/A 03/14/2013   Procedure: COLONOSCOPY;  Surgeon: Daneil Dolin, MD;  [facility];  Service: Endoscopy;  Laterality: N/A;  8:15 AM  . EYE SURGERY     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-[facility]  . EYE SURGERY  2012   Left eye Corneal transplant- partial- Cataract  . GALLBLADDER SURGERY  12/01/2006  . HAND TENDON SURGERY Left late 1990's   thumb  . HEMORROIDECTOMY  2003  . INJECTION KNEE Left 08/26/2017   Procedure: LEFT KNEE INJECTION;  Surgeon: Elsie Saas, MD;  Location: [place];  Service: Orthopedics;  Laterality: Left;  . left Knee Arthroscopy     [facility] 21 2011- [facility]  . PARTIAL KNEE ARTHROPLASTY  11/22/2012   Procedure: UNICOMPARTMENTAL KNEE;  Surgeon: Lorn Junes, MD;  Location: [place];  Service: Orthopedics;  Laterality: Left;  left unicompartmental knee arthroplasty  . PERMANENT PACEMAKER GENERATOR CHANGE N/A 01/12/2013   Procedure: PERMANENT PACEMAKER GENERATOR CHANGE;  Surgeon: Evans Lance, MD;  Location: [facility] CATH LAB;  Service: Cardiovascular;  Laterality: N/A;  . Rotator cuff Surgery  2001   Right shoulder  . TONSILLECTOMY    . TOTAL KNEE ARTHROPLASTY Right 08/26/2017   Procedure: TOTAL KNEE ARTHROPLASTY;  Surgeon: Elsie Saas,  MD;  Location: [place];  Service: Orthopedics;  Laterality: Right;    There were no vitals filed for this visit.      Subjective Assessment - 09/14/17 1648    Subjective Paitent arrives stating that he feels he might have overdone it with exercises this weekend, he couldn't sleep very well last night due to pain   Pertinent History HTN, hx PVCs, R BBB, heart valve disorders, OSA, DM, Pacemaker (recent disfunction but MD has repaired and is monitoring), L partial knee, R knee replacement 08/26/17)   Patient Stated Goals be able to go on vacation and walk the beach    Currently in Pain? Yes   Pain Score 4    Pain Location Knee   Pain Orientation Right   Pain Descriptors / Indicators Aching;Throbbing   Pain Type Surgical pain   Pain Radiating Towards none    Pain Onset 1 to 4 weeks ago   Pain Frequency Constant   Aggravating Factors  donig too much    Pain Relieving Factors medicines, ice, rest    Effect of Pain on Daily Activities moderate-severe             [facility] PT Assessment - 09/14/17 0001      AROM   Right Knee Flexion 95  during standing knee flexion stretch                     [facility] Adult PT Treatment/Exercise - 09/14/17 0001      Knee/Hip Exercises: Stretches   Active Hamstring Stretch Right;3 reps;30 seconds   Active Hamstring Stretch Limitations 12 inch box    Knee: Self-Stretch to increase Flexion Right;10 seconds   Knee: Self-Stretch Limitations x15 reps    Gastroc Stretch Both;3 reps;30 seconds   Gastroc Stretch Limitations slantboard      Knee/Hip Exercises: Standing   Heel Raises Both;1 set;15 reps   Heel Raises Limitations heel and toe, intermittent HHA    Other Standing Knee Exercises hip hikes 1x10 B      Manual Therapy   Manual Therapy Edema management;Joint mobilization   Manual therapy comments performed separate from all other servcies    Edema Management LE elevated, retrograde massage    Joint Mobilization tib on femur grade III;  patella mobility grade III all directions                 PT Education - 09/14/17 1729    Education provided Yes   Education Details HEP update    Person(s) Educated Patient   Methods Explanation;Handout   Comprehension Verbalized understanding;Returned demonstration;Need further instruction          PT Short Term Goals - 09/07/17 1212      PT SHORT TERM GOAL #1   Title Patient to demonstrate R knee ROM as  being 0-115 in order to improve gait mechanics and overall mobility    Time 3   Period Weeks   Status New   Target Date 09/28/17     PT SHORT TERM GOAL #2   Title Patient to be consistently ambulating with SPC, consistent heel-toe mechanics and minimal unsteadiness, in order to show improved balance and general mobility    Time 3   Period Weeks   Status New     PT SHORT TERM GOAL #3   Title Patient to be able to complete sit to stand with no UEs and no weight shift to L LE in order to show improved proprioception and strength in surgical limb    Time 3   Period Weeks   Status New     PT SHORT TERM GOAL #4   Title Patient to correctly and consistently perform appropriate HEP, to be updated PRN    Time 1   Period Weeks   Status New   Target Date 09/14/17           PT Long Term Goals - 09/07/17 1214      PT LONG TERM GOAL #1   Title Patient to demonstrate strength 5/5 in all tested muscle groups in order to assist in maintaining correct functional posture and reducing pain    Time 6   Period Weeks   Status New   Target Date 10/19/17     PT LONG TERM GOAL #2   Title Patient to be able to ambulate over level surfaces with no device and unsteady/uneven surfaces with cane, correct heel-toe pattern and minimal unsteadiness, in order to improve mobiilty and improved home/community access    Time 6   Period Weeks   Status New     PT LONG TERM GOAL #3   Title Patient to be able to complete TUG in 12 seconds or less with no device and maintain SLS for at  least 15 seconds on level surface in order to demonstrate improved functional balance    Time 6   Period Weeks   Status New     PT LONG TERM GOAL #4   Title Patient to reciprocally ascend/descend stairs with U railing, minimal unsteadiness, good eccentric control in order to improve home/community access    Time 6   Period Weeks   Status New               Plan - 09/14/17 1729    Clinical Impression Statement Continued with manual techniques today with focus on ROM; patient appears to tolerate these better today however does continue to be limited by joint and edema limitations today. Continued with functional stretches as well as  strength and balance training this session. Patient doing well overall however was encouraged to ice more regularly to assist in edema management. Incision appears well healing with no signs of infection/inflammation, but does demonstrate some ongoing scabbing and is not quite healed at this point.    Rehab Potential Excellent   Clinical Impairments Affecting Rehab Potential (+) motivated to participate, success with PT in the past   PT Frequency 3x / week   PT Duration 6 weeks   PT Treatment/Interventions ADLs/Self Care Home Management;Cryotherapy;Moist Heat;DME Instruction;Gait training;Stair training;Functional mobility training;Therapeutic activities;Therapeutic exercise;Balance training;Neuromuscular re-education;Patient/family education;Manual techniques;Scar mobilization;Passive range of motion;Dry needling;Taping   PT Next Visit Plan assess R anterior/lateral/posterior hip for soft tissue limitations/muscle spasms contributing to pain; continue focus on ROM especially flexion. Gait and balance training.  PT Home Exercise Plan Eval: continuew with HHPT HEP, increase focus on quad sets, heel slides, knee flexion and extension stretches    Consulted and Agree with Plan of Care Patient      Patient will benefit from skilled therapeutic intervention in  order to improve the following deficits and impairments:  Abnormal gait, Decreased skin integrity, Pain, Improper body mechanics, Decreased coordination, Decreased mobility, Decreased scar mobility, Increased muscle spasms, Postural dysfunction, Decreased activity tolerance, Decreased range of motion, Decreased strength, Hypomobility, Decreased balance, Decreased safety awareness, Difficulty walking, Impaired flexibility, Increased edema  Visit Diagnosis: Stiffness of right knee, not elsewhere classified  Localized edema  Difficulty in walking, not elsewhere classified  Unsteadiness on feet     Problem List Patient Active Problem List   Diagnosis Date Noted  . Primary localized osteoarthritis of right knee 08/11/2017  . Asthma with acute exacerbation 12/08/2016  . Chest pain 10/20/2016  . HTN (hypertension) 10/20/2016  . Morbid obesity due to excess calories ([place]) 06/24/2016  . Cough variant asthma 02/12/2016  . Upper airway cough syndrome 01/24/2016  . 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 Essential hypertension   . Persistent atrial fibrillation ([place])   . Syncope   . Hyperthyroidism   . Hyperlipidemia   . RBBB (right bundle branch block)   . Tricuspid valve disorder   . Aortic valve disorder   . Carotid artery stenosis   . Diabetes ([place])   . Pacemaker   . OSA (obstructive sleep apnea)   . Arthritis   . Sinoatrial node dysfunction (HCC)   . PVC's (premature ventricular contractions) 07/17/2011  . Essential hypertension, benign 01/24/2011  . Premature ventricular contractions 01/24/2011  . PPM-St.[place]ude 01/24/2011    Deniece Ree PT, DPT [id number]  [place] [address] [place], [place], [address] Phone: [phone]   Fax:  [phone]  Name: Clarence Dawson MRN: [id number] Date of Birth: 03/22/1946

## 2017-09-14 NOTE — Patient Instructions (Signed)
  HIP HIKES  While standing up on a step, lower one leg downward towards the floor by tilting your pelvis to the side.   Then return the pelvis/leg back to a leveled position.  You may hold on to help with your balance.  Repeat 10-15 times each side, twice a day.

## 2017-09-16 ENCOUNTER — Ambulatory Visit (HOSPITAL_COMMUNITY): Payer: Medicare Other

## 2017-09-16 ENCOUNTER — Encounter (HOSPITAL_COMMUNITY): Payer: Self-pay

## 2017-09-16 DIAGNOSIS — R2681 Unsteadiness on feet: Secondary | ICD-10-CM | POA: Diagnosis not present

## 2017-09-16 DIAGNOSIS — R262 Difficulty in walking, not elsewhere classified: Secondary | ICD-10-CM

## 2017-09-16 DIAGNOSIS — M25661 Stiffness of right knee, not elsewhere classified: Secondary | ICD-10-CM

## 2017-09-16 DIAGNOSIS — R600 Localized edema: Secondary | ICD-10-CM

## 2017-09-16 NOTE — Therapy (Signed)
[place], [place], [address] Phone: [phone]   Fax:  [phone]  Physical Therapy Treatment  Patient Details  Name: Clarence Dawson MRN: [id number] Date of Birth: December 09, 1946 Referring Provider: Elsie Saas  Encounter Date: 09/16/2017      PT End of Session - 09/16/17 1322    Visit Number 4   Number of Visits 19   Date for PT Re-Evaluation 09/28/17   Authorization Type Medicare/[employer] Indemnity    Authorization Time Period 09/07/17 to 10/19/17   Authorization - Visit Number 4   Authorization - Number of Visits 10   PT Start Time 1302   PT Stop Time 1346   PT Time Calculation (min) 44 min   Activity Tolerance Patient tolerated treatment well   Behavior During Therapy [facility] for tasks assessed/performed      Past Medical History:  Diagnosis Date  . Allergic rhinitis   . Aortic valve disorder   . Asthma    since childhood- seasonal allergies induced  . Cancer ([place])    Skin cancer- squamous, basal  . Carotid artery stenosis   . Essential hypertension   . Full dentures   . GERD (gastroesophageal reflux disease)   . H/O hiatal hernia   . Hemorrhage of rectum   . Hyperlipidemia   . Hypothyroidism   . 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 OSA (obstructive sleep apnea)   . Osteoarthritis   . Pacemaker    Oct 2005 in [place].  Marland Kitchen PAF (paroxysmal atrial fibrillation) ([place])   . Pneumonia    "several Times" 2015 last time  . Primary localized osteoarthritis of right knee 08/11/2017  . RBBB (right bundle branch block)   . Sinoatrial node dysfunction (HCC)   . Syncope   . Tricuspid valve disorder   . Type 2 diabetes mellitus ([place])    Type II    Past Surgical History:  Procedure Laterality Date  . A-V CARDIAC PACEMAKER INSERTION     Sick sinus syndrome DDR pacer  . Arthropathy Right 2005   Rebuilding of left thumb and joint   . CARDIAC CATHETERIZATION    . CARDIAC ELECTROPHYSIOLOGY STUDY AND ABLATION  09/2008   for pvcs, Dr. Loralie Champagne  . CARDIOVERSION N/A 12/18/2016   Procedure: CARDIOVERSION;  Surgeon: Pixie Casino, MD;  Location: [facility] ENDOSCOPY;  Service: Cardiovascular;  Laterality: N/A;  . [place]   right wrist  . CARPAL TUNNEL RELEASE  05/04/2012   Procedure: CARPAL TUNNEL RELEASE;  Surgeon: Wynonia Sours, MD;  Location: [place];  Service: Orthopedics;  Laterality: Left;  . CARPOMETACARPEL SUSPENSION PLASTY Right 11/16/2014   Procedure: SUSPENSIONPLASTY RIGHT THUMB TENDON TRANSFER ABDUCTOR POLLICUS LONGUS EXCISION TRAPEZIUM;  Surgeon: Daryll Brod, MD;  Location: [place];  Service: Orthopedics;  Laterality: Right;  . CHOLECYSTECTOMY  1994  . CIRCUMCISION    . COLONOSCOPY N/A 03/14/2013   Procedure: COLONOSCOPY;  Surgeon: Daneil Dolin, MD;  [facility];  Service: Endoscopy;  Laterality: N/A;  8:15 AM  . 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 EYE SURGERY  2012   Left eye Corneal transplant- partial- Cataract  . GALLBLADDER SURGERY  12/01/2006  . HAND TENDON SURGERY Left late 1990's   thumb  . HEMORROIDECTOMY  2003  . INJECTION KNEE Left 08/26/2017   Procedure: LEFT KNEE INJECTION;  Surgeon: Elsie Saas, MD;  Location: [place];  Service: Orthopedics;  Laterality:  Left;  . left Knee Arthroscopy     [facility]  . PARTIAL KNEE ARTHROPLASTY  11/22/2012   Procedure: UNICOMPARTMENTAL KNEE;  Surgeon: Lorn Junes, MD;  Location: [place];  Service: Orthopedics;  Laterality: Left;  left unicompartmental knee arthroplasty  . PERMANENT PACEMAKER GENERATOR CHANGE N/A 01/12/2013   Procedure: PERMANENT PACEMAKER GENERATOR CHANGE;  Surgeon: Evans Lance, MD;  Location: [facility] CATH LAB;  Service: Cardiovascular;  Laterality: N/A;  . Rotator cuff Surgery  2001   Right shoulder  . TONSILLECTOMY    . TOTAL KNEE ARTHROPLASTY Right 08/26/2017   Procedure: TOTAL KNEE ARTHROPLASTY;  Surgeon: Elsie Saas,  MD;  Location: [place];  Service: Orthopedics;  Laterality: Right;    There were no vitals filed for this visit.      Subjective Assessment - 09/16/17 1303    Subjective Pt stated knee pain scale 4/10, mainly stiffness, tightness on front of knee.   Pertinent History HTN, hx PVCs, R BBB, heart valve disorders, OSA, DM, Pacemaker (recent disfunction but MD has repaired and is monitoring), L partial knee, R knee replacement 08/26/17)   Patient Stated Goals be able to go on vacation and walk the beach    Currently in Pain? Yes   Pain Score 4    Pain Location Knee   Pain Orientation Right   Pain Descriptors / Indicators Aching;Throbbing;Tightness   Pain Type Surgical pain   Pain Onset 1 to 4 weeks ago   Pain Frequency Constant   Aggravating Factors  doing too much   Pain Relieving Factors medication, ice, rest   Effect of Pain on Daily Activities moderate- severe            [facility] PT Assessment - 09/16/17 0001      Assessment   Medical Diagnosis R TKR    Referring Provider Elsie Saas   Onset Date/Surgical Date 08/26/17   Next MD Visit Dr. Noemi Chapel 09/22/17   Prior Therapy HHPT but has been discharged      Precautions   Precautions None     AROM   Right Knee Extension 4   Right Knee Flexion 105  was 95 degrees                      [facility] Adult PT Treatment/Exercise - 09/16/17 0001      Knee/Hip Exercises: Stretches   Active Hamstring Stretch 3 reps;30 seconds   Active Hamstring Stretch Limitations supine with towel   Knee: Self-Stretch to increase Flexion Right;10 seconds   Knee: Self-Stretch Limitations x15 reps    Gastroc Stretch Both;3 reps;30 seconds   Gastroc Stretch Limitations slantboard      Knee/Hip Exercises: Seated   Sit to Sand 10 reps;without UE support  eccentric control and equal weight bearing     Knee/Hip Exercises: Supine   Quad Sets 10 reps   Quad Sets Limitations 3" holds   Short Arc Quad Sets 15 reps   Heel Slides 10 reps   Heel  Slides Limitations 5" holds    Knee Extension Limitations 4 degrees extension   Knee Flexion Limitations 105 degrees AROM flexion     Manual Therapy   Manual Therapy Soft tissue mobilization;Edema management;Joint mobilization   Manual therapy comments performed separate from all other servcies    Edema Management LE elevated, retrograde massage    Joint Mobilization tib on femur grade III; patella mobility grade III all directions    Soft tissue mobilization  STM to quad and medial hamstrings                  PT Short Term Goals - 09/07/17 1212      PT SHORT TERM GOAL #1   Title Patient to demonstrate R knee ROM as being 0-115 in order to improve gait mechanics and overall mobility    Time 3   Period Weeks   Status New   Target Date 09/28/17     PT SHORT TERM GOAL #2   Title Patient to be consistently ambulating with SPC, consistent heel-toe mechanics and minimal unsteadiness, in order to show improved balance and general mobility    Time 3   Period Weeks   Status New     PT SHORT TERM GOAL #3   Title Patient to be able to complete sit to stand with no UEs and no weight shift to L LE in order to show improved proprioception and strength in surgical limb    Time 3   Period Weeks   Status New     PT SHORT TERM GOAL #4   Title Patient to correctly and consistently perform appropriate HEP, to be updated PRN    Time 1   Period Weeks   Status New   Target Date 09/14/17           PT Long Term Goals - 09/07/17 1214      PT LONG TERM GOAL #1   Title Patient to demonstrate strength 5/5 in all tested muscle groups in order to assist in maintaining correct functional posture and reducing pain    Time 6   Period Weeks   Status New   Target Date 10/19/17     PT LONG TERM GOAL #2   Title Patient to be able to ambulate over level surfaces with no device and unsteady/uneven surfaces with cane, correct heel-toe pattern and minimal unsteadiness, in order to improve  mobiilty and improved home/community access    Time 6   Period Weeks   Status New     PT LONG TERM GOAL #3   Title Patient to be able to complete TUG in 12 seconds or less with no device and maintain SLS for at least 15 seconds on level surface in order to demonstrate improved functional balance    Time 6   Period Weeks   Status New     PT LONG TERM GOAL #4   Title Patient to reciprocally ascend/descend stairs with U railing, minimal unsteadiness, good eccentric control in order to improve home/community access    Time 6   Period Weeks   Status New               Plan - 09/16/17 1329    Clinical Impression Statement Session focus addressing knee mobility.  Began session with manual retro massage for edema control, joint mobs for mobility and soft tissue mobilizaiton to address tightness and knots noted medial hamstrings.  Overall pt making great progress with improved AROM 4-105 degrees.  Noted pinkness on sides of incision, no heat or drainage noted thoguh does have some ongoing scabbing not quite healed at this point.  Encouraged pt to keep eye on incision and call MD if any changed noted   Rehab Potential Excellent   Clinical Impairments Affecting Rehab Potential (+) motivated to participate, success with PT in the past   PT Frequency 3x / week   PT Duration 6 weeks   PT Treatment/Interventions ADLs/Self Care Home  Management;Cryotherapy;Moist Heat;DME Instruction;Gait training;Stair training;Functional mobility training;Therapeutic activities;Therapeutic exercise;Balance training;Neuromuscular re-education;Patient/family education;Manual techniques;Scar mobilization;Passive range of motion;Dry needling;Taping   PT Next Visit Plan Continue manual technqiues to address soft tissue restricitons/muscle spasms contributing to pain and ROM.  Therex focus on ROM flexion>extension.  Progress gait, balance and functional strengthening once ROM goals met.     PT Home Exercise Plan Eval:  continuew with HHPT HEP, increase focus on quad sets, heel slides, knee flexion and extension stretches       Patient will benefit from skilled therapeutic intervention in order to improve the following deficits and impairments:  Abnormal gait, Decreased skin integrity, Pain, Improper body mechanics, Decreased coordination, Decreased mobility, Decreased scar mobility, Increased muscle spasms, Postural dysfunction, Decreased activity tolerance, Decreased range of motion, Decreased strength, Hypomobility, Decreased balance, Decreased safety awareness, Difficulty walking, Impaired flexibility, Increased edema  Visit Diagnosis: Stiffness of right knee, not elsewhere classified  Localized edema  Difficulty in walking, not elsewhere classified  Unsteadiness on feet     Problem List Patient Active Problem List   Diagnosis Date Noted  . Primary localized osteoarthritis of right knee 08/11/2017  . Asthma with acute exacerbation 12/08/2016  . Chest pain 10/20/2016  . HTN (hypertension) 10/20/2016  . Morbid obesity due to excess calories ([place]) 06/24/2016  . Cough variant asthma 02/12/2016  . Upper airway cough syndrome 01/24/2016  . 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 Essential hypertension   . Persistent atrial fibrillation ([place])   . Syncope   . Hyperthyroidism   . Hyperlipidemia   . RBBB (right bundle branch block)   . Tricuspid valve disorder   . Aortic valve disorder   . Carotid artery stenosis   . Diabetes ([place])   . Pacemaker   . OSA (obstructive sleep apnea)   . Arthritis   . Sinoatrial node dysfunction (HCC)   . PVC's (premature ventricular contractions) 07/17/2011  . Essential hypertension, benign 01/24/2011  . Premature ventricular contractions 01/24/2011  . PPM-[place] 01/24/2011   Ihor Austin, [place]; [place]  Aldona Lento 09/16/2017, 2:35 PM  [place] [address] [place], [place], [address] Phone:  [phone]   Fax:  [phone]  Name: Clarence Dawson MRN: [id number] Date of Birth: [birth year]

## 2017-09-18 ENCOUNTER — Ambulatory Visit (HOSPITAL_COMMUNITY): Payer: Medicare Other | Admitting: Physical Therapy

## 2017-09-18 ENCOUNTER — Telehealth (HOSPITAL_COMMUNITY): Payer: Self-pay | Admitting: Physical Therapy

## 2017-09-18 DIAGNOSIS — R600 Localized edema: Secondary | ICD-10-CM | POA: Diagnosis not present

## 2017-09-18 DIAGNOSIS — R262 Difficulty in walking, not elsewhere classified: Secondary | ICD-10-CM

## 2017-09-18 DIAGNOSIS — R2681 Unsteadiness on feet: Secondary | ICD-10-CM | POA: Diagnosis not present

## 2017-09-18 DIAGNOSIS — M25661 Stiffness of right knee, not elsewhere classified: Secondary | ICD-10-CM | POA: Diagnosis not present

## 2017-09-18 NOTE — Telephone Encounter (Signed)
called to see if pt could come in early

## 2017-09-18 NOTE — Therapy (Signed)
[place], [place], [address] Phone: [phone]   Fax:  [phone]  Physical Therapy Treatment  Patient Details  Name: Clarence Dawson MRN: [id number] Date of Birth: 06/26/1946 Referring Provider: Elsie Saas  Encounter Date: 09/18/2017      PT End of Session - 09/18/17 1550    Visit Number 5   Number of Visits 19   Date for PT Re-Evaluation 09/28/17   Authorization Type Medicare/[employer] Indemnity    Authorization Time Period 09/07/17 to 10/19/17   Authorization - Visit Number 5   Authorization - Number of Visits 10   PT Start Time 6503   PT Stop Time 1600   PT Time Calculation (min) 45 min   Activity Tolerance Patient tolerated treatment well   Behavior During Therapy [facility] for tasks assessed/performed      Past Medical History:  Diagnosis Date  . Allergic rhinitis   . Aortic valve disorder   . Asthma    since childhood- seasonal allergies induced  . Cancer ([place])    Skin cancer- squamous, basal  . Carotid artery stenosis   . Essential hypertension   . Full dentures   . GERD (gastroesophageal reflux disease)   . H/O hiatal hernia   . Hemorrhage of rectum   . Hyperlipidemia   . Hypothyroidism   . 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 OSA (obstructive sleep apnea)   . Osteoarthritis   . Pacemaker    Oct 2005 in [place].  Marland Kitchen PAF (paroxysmal atrial fibrillation) ([place])   . Pneumonia    "several Times" 2015 last time  . Primary localized osteoarthritis of right knee 08/11/2017  . RBBB (right bundle branch block)   . Sinoatrial node dysfunction (HCC)   . Syncope   . Tricuspid valve disorder   . Type 2 diabetes mellitus ([place])    Type II    Past Surgical History:  Procedure Laterality Date  . A-V CARDIAC PACEMAKER INSERTION     Sick sinus syndrome DDR pacer  . Arthropathy Right 2005   Rebuilding of left thumb and joint   . CARDIAC CATHETERIZATION    . CARDIAC ELECTROPHYSIOLOGY STUDY AND ABLATION  09/2008   for pvcs, Dr. Loralie Champagne  . CARDIOVERSION N/A 12/18/2016   Procedure: CARDIOVERSION;  Surgeon: Pixie Casino, MD;  Location: [facility] ENDOSCOPY;  Service: Cardiovascular;  Laterality: N/A;  . [place]   right wrist  . CARPAL TUNNEL RELEASE  05/04/2012   Procedure: CARPAL TUNNEL RELEASE;  Surgeon: Wynonia Sours, MD;  Location: [place];  Service: Orthopedics;  Laterality: Left;  . CARPOMETACARPEL SUSPENSION PLASTY Right 11/16/2014   Procedure: SUSPENSIONPLASTY RIGHT THUMB TENDON TRANSFER ABDUCTOR POLLICUS LONGUS EXCISION TRAPEZIUM;  Surgeon: Daryll Brod, MD;  Location: [place];  Service: Orthopedics;  Laterality: Right;  . CHOLECYSTECTOMY  1994  . CIRCUMCISION    . COLONOSCOPY N/A 03/14/2013   Procedure: COLONOSCOPY;  Surgeon: Daneil Dolin, MD;  [facility];  Service: Endoscopy;  Laterality: N/A;  8:15 AM  . EYE SURGERY     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  . EYE SURGERY  2012   Left eye Corneal transplant- partial- Cataract  . GALLBLADDER SURGERY  12/01/2006  . HAND TENDON SURGERY Left late 1990's   thumb  . HEMORROIDECTOMY  2003  . INJECTION KNEE Left 08/26/2017   Procedure: LEFT KNEE INJECTION;  Surgeon: Elsie Saas, MD;  Location: [place];  Service: Orthopedics;  Laterality:  Left;  . left Knee Arthroscopy     April 21 2011- [facility] center  . PARTIAL KNEE ARTHROPLASTY  11/22/2012   Procedure: UNICOMPARTMENTAL KNEE;  Surgeon: Lorn Junes, MD;  Location: [place];  Service: Orthopedics;  Laterality: Left;  left unicompartmental knee arthroplasty  . PERMANENT PACEMAKER GENERATOR CHANGE N/A 01/12/2013   Procedure: PERMANENT PACEMAKER GENERATOR CHANGE;  Surgeon: Evans Lance, MD;  [facility] CATH LAB;  Service: Cardiovascular;  Laterality: N/A;  . Rotator cuff Surgery  2001   Right shoulder  . TONSILLECTOMY    . TOTAL KNEE ARTHROPLASTY Right 08/26/2017   Procedure: TOTAL KNEE ARTHROPLASTY;  Surgeon: Elsie Saas,  MD;  Location: [place];  Service: Orthopedics;  Laterality: Right;    There were no vitals filed for this visit.      Subjective Assessment - 09/18/17 1516    Subjective Pt states he has been looking for shower doors. He has been up on his feet more than usual    Pertinent History HTN, hx PVCs, R BBB, heart valve disorders, OSA, DM, Pacemaker (recent disfunction but MD has repaired and is monitoring), L partial knee, R knee replacement 08/26/17)   How long can you walk comfortably? 30 minutes    Patient Stated Goals be able to go on vacation and walk the beach    Currently in Pain? Yes   Pain Score 4    Pain Location Knee   Pain Orientation Right   Pain Descriptors / Indicators Aching   Pain Type Acute pain   Pain Onset 1 to 4 weeks ago   Pain Frequency Constant  varies in intensity    Aggravating Factors  activity    Pain Relieving Factors ice                          [facility] Adult PT Treatment/Exercise - 09/18/17 0001      Knee/Hip Exercises: Stretches   Active Hamstring Stretch 3 reps;30 seconds   Active Hamstring Stretch Limitations supine with towel   Quad Stretch Right;3 reps;30 seconds   Knee: Self-Stretch to increase Flexion Right;20 seconds   Knee: Self-Stretch Limitations 5   Gastroc Stretch Both;3 reps;30 seconds   Gastroc Stretch Limitations slantboard      Knee/Hip Exercises: Standing   Heel Raises Both;15 reps   Knee Flexion Right;10 reps   Lateral Step Up Right;10 reps   Stairs 3 RT reciprocal    Rocker Board 1 minute   SLS 5x max 6"RT; 5" LT      Knee/Hip Exercises: Seated   Sit to Sand 10 reps;without UE support  eccentric control and equal weight bearing     Knee/Hip Exercises: Supine   Quad Sets 10 reps   Heel Slides 10 reps   Knee Extension Limitations 4 degrees extension   Knee Flexion Limitations 107 degrees AROM flexion     Knee/Hip Exercises: Prone   Hip Extension Both;10 reps   Contract/Relax to Increase Flexion 5      Manual Therapy   Manual Therapy Soft tissue mobilization;Edema management;Joint mobilization   Manual therapy comments performed separate from all other servcies    Edema Management LE elevated, retrograde massage    Joint Mobilization tib on femur grade III; patella mobility grade III all directions    Soft tissue mobilization STM to quad and medial hamstrings                  PT Short Term  Goals - 09/18/17 1609      PT SHORT TERM GOAL #1   Title Patient to demonstrate R knee ROM as being 0-115 in order to improve gait mechanics and overall mobility    Time 3   Period Weeks   Status Partially Met     PT SHORT TERM GOAL #2   Title Patient to be consistently ambulating with SPC, consistent heel-toe mechanics and minimal unsteadiness, in order to show improved balance and general mobility    Time 3   Period Weeks   Status Achieved     PT SHORT TERM GOAL #3   Title Patient to be able to complete sit to stand with no UEs and no weight shift to L LE in order to show improved proprioception and strength in surgical limb    Time 3   Period Weeks   Status Achieved     PT SHORT TERM GOAL #4   Title Patient to correctly and consistently perform appropriate HEP, to be updated PRN    Time 1   Period Weeks   Status Achieved           PT Long Term Goals - 09/18/17 1610      PT LONG TERM GOAL #1   Title Patient to demonstrate strength 5/5 in all tested muscle groups in order to assist in maintaining correct functional posture and reducing pain    Time 6   Period Weeks   Status On-going     PT LONG TERM GOAL #2   Title Patient to be able to ambulate over level surfaces with no device and unsteady/uneven surfaces with cane, correct heel-toe pattern and minimal unsteadiness, in order to improve mobiilty and improved home/community access    Time 6   Period Weeks   Status On-going     PT LONG TERM GOAL #3   Title Patient to be able to complete TUG in 12 seconds or less  with no device and maintain SLS for at least 15 seconds on level surface in order to demonstrate improved functional balance    Time 6   Period Weeks   Status On-going     PT LONG TERM GOAL #4   Title Patient to reciprocally ascend/descend stairs with U railing, minimal unsteadiness, good eccentric control in order to improve home/community access    Time 6   Period Weeks   Status Partially Met               Plan - 09/18/17 1607    Clinical Impression Statement Pt continues to improve in his functional ability.  Questioned whether he could complete steps therefore added steps into program with minimal to moderate deviation noted at this time.  Swelling is slowly decreasing allowing ROM to increase.    Rehab Potential Excellent   Clinical Impairments Affecting Rehab Potential (+) motivated to participate, success with PT in the past   PT Frequency 3x / week   PT Duration 6 weeks   PT Treatment/Interventions ADLs/Self Care Home Management;Cryotherapy;Moist Heat;DME Instruction;Gait training;Stair training;Functional mobility training;Therapeutic activities;Therapeutic exercise;Balance training;Neuromuscular re-education;Patient/family education;Manual techniques;Scar mobilization;Passive range of motion;Dry needling;Taping   PT Next Visit Plan Continue to focus on balance and ROM    PT Home Exercise Plan Eval: continuew with HHPT HEP, increase focus on quad sets, heel slides, knee flexion and extension stretches       Patient will benefit from skilled therapeutic intervention in order to improve the following deficits and impairments:  Abnormal gait, Decreased  skin integrity, Pain, Improper body mechanics, Decreased coordination, Decreased mobility, Decreased scar mobility, Increased muscle spasms, Postural dysfunction, Decreased activity tolerance, Decreased range of motion, Decreased strength, Hypomobility, Decreased balance, Decreased safety awareness, Difficulty walking, Impaired  flexibility, Increased edema  Visit Diagnosis: Stiffness of right knee, not elsewhere classified  Localized edema  Difficulty in walking, not elsewhere classified  Unsteadiness on feet     Problem List Patient Active Problem List   Diagnosis Date Noted  . Primary localized osteoarthritis of right knee 08/11/2017  . Asthma with acute exacerbation 12/08/2016  . Chest pain 10/20/2016  . HTN (hypertension) 10/20/2016  . Morbid obesity due to excess calories ([place]) 06/24/2016  . Cough variant asthma 02/12/2016  . Upper airway cough syndrome 01/24/2016  . 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 Essential hypertension   . Persistent atrial fibrillation ([place])   . Syncope   . Hyperthyroidism   . Hyperlipidemia   . RBBB (right bundle branch block)   . Tricuspid valve disorder   . Aortic valve disorder   . Carotid artery stenosis   . Diabetes ([place])   . Pacemaker   . OSA (obstructive sleep apnea)   . Arthritis   . Sinoatrial node dysfunction (HCC)   . PVC's (premature ventricular contractions) 07/17/2011  . Essential hypertension, benign 01/24/2011  . Premature ventricular contractions 01/24/2011  . PPM-[place] 01/24/2011    Rayetta Humphrey, PT CLT [phone] 09/18/2017, 4:12 PM  [place] [address] [place], [place], [address] Phone: [phone]   Fax:  [phone]  Name: Clarence Dawson MRN: [id number] Date of Birth: 1946-01-26

## 2017-09-21 ENCOUNTER — Ambulatory Visit (HOSPITAL_COMMUNITY): Payer: Medicare Other | Admitting: Physical Therapy

## 2017-09-21 ENCOUNTER — Encounter (HOSPITAL_COMMUNITY): Payer: Self-pay | Admitting: Physical Therapy

## 2017-09-21 DIAGNOSIS — R2681 Unsteadiness on feet: Secondary | ICD-10-CM | POA: Diagnosis not present

## 2017-09-21 DIAGNOSIS — R600 Localized edema: Secondary | ICD-10-CM

## 2017-09-21 DIAGNOSIS — R262 Difficulty in walking, not elsewhere classified: Secondary | ICD-10-CM

## 2017-09-21 DIAGNOSIS — M25661 Stiffness of right knee, not elsewhere classified: Secondary | ICD-10-CM | POA: Diagnosis not present

## 2017-09-21 NOTE — Therapy (Signed)
[place], [place], [address] Phone: [phone]   Fax:  [phone]  Physical Therapy Treatment  Patient Details  Name: Clarence Dawson MRN: [id number] Date of Birth: [birth year] Referring Provider: Elsie Saas  Encounter Date: 09/21/2017      PT End of Session - 09/21/17 0857    Visit Number 6   Number of Visits 19   Date for PT Re-Evaluation 09/28/17   Authorization Type Medicare/[employer] Indemnity    Authorization Time Period 09/07/17 to 10/19/17   Authorization - Visit Number 6   Authorization - Number of Visits 10   PT Start Time 0816   PT Stop Time 0856   PT Time Calculation (min) 40 min   Activity Tolerance Patient tolerated treatment well   Behavior During Therapy [facility] for tasks assessed/performed      Past Medical History:  Diagnosis Date  . Allergic rhinitis   . Aortic valve disorder   . Asthma    since childhood- seasonal allergies induced  . Cancer ([place])    Skin cancer- squamous, basal  . Carotid artery stenosis   . Essential hypertension   . Full dentures   . GERD (gastroesophageal reflux disease)   . H/O hiatal hernia   . Hemorrhage of rectum   . Hyperlipidemia   . Hypothyroidism   . 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 OSA (obstructive sleep apnea)   . Osteoarthritis   . Pacemaker    Oct 2005 in [place].  Marland Kitchen PAF (paroxysmal atrial fibrillation) ([place])   . Pneumonia    "several Times" 2015 last time  . Primary localized osteoarthritis of right knee 08/11/2017  . RBBB (right bundle branch block)   . Sinoatrial node dysfunction (HCC)   . Syncope   . Tricuspid valve disorder   . Type 2 diabetes mellitus ([place])    Type II    Past Surgical History:  Procedure Laterality Date  . A-V CARDIAC PACEMAKER INSERTION     Sick sinus syndrome DDR pacer  . Arthropathy Right 2005   Rebuilding of left thumb and joint   . CARDIAC CATHETERIZATION    . CARDIAC ELECTROPHYSIOLOGY STUDY AND ABLATION  09/2008    for pvcs, Dr. Loralie Champagne  . CARDIOVERSION N/A 12/18/2016   Procedure: CARDIOVERSION;  Surgeon: Pixie Casino, MD;  Location: [facility] END[facility]COPY;  Service: Cardiovascular;  Laterality: N/A;  . [place]   right wrist  . CARPAL TUNNEL RELEASE  05/04/2012   Procedure: CARPAL TUNNEL RELEASE;  Surgeon: Wynonia Sours, MD;  Location: [place];  Service: Orthopedics;  Laterality: Left;  . CARPOMETACARPEL SUSPENSION PLASTY Right 11/16/2014   Procedure: SUSPENSIONPLASTY RIGHT THUMB TENDON TRANSFER ABDUCTOR POLLICUS LONGUS EXCISION TRAPEZIUM;  Surgeon: Daryll Brod, MD;  Location: [place];  Service: Orthopedics;  Laterality: Right;  . CHOLECYSTECTOMY  1994  . CIRCUMCISION    . COLONOSCOPY N/A 03/14/2013   Procedure: COLONOSCOPY;  Surgeon: Daneil Dolin, MD;  [facility];  Service: Endoscopy;  Laterality: N/A;  8:15 AM  . 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 EYE SURGERY  2012   Left eye Corneal transplant- partial- Cataract  . GALLBLADDER SURGERY  12/01/2006  . HAND TENDON SURGERY Left late 1990's   thumb  . HEMORROIDECTOMY  2003  . INJECTION KNEE Left 08/26/2017   Procedure: LEFT KNEE INJECTION;  Surgeon: Elsie Saas, MD;  Location: [place];  Service: Orthopedics;  Laterality: Left;  . left Knee Arthroscopy     [facility]  . PARTIAL KNEE ARTHROPLASTY  11/22/2012   Procedure: UNICOMPARTMENTAL KNEE;  Surgeon: Lorn Junes, MD;  Location: [place];  Service: Orthopedics;  Laterality: Left;  left unicompartmental knee arthroplasty  . PERMANENT PACEMAKER GENERATOR CHANGE N/A 01/12/2013   Procedure: PERMANENT PACEMAKER GENERATOR CHANGE;  Surgeon: Evans Lance, MD;  Location: [facility] CATH LAB;  Service: Cardiovascular;  Laterality: N/A;  . Rotator cuff Surgery  2001   Right shoulder  . TONSILLECTOMY    . TOTAL KNEE ARTHROPLASTY Right 08/26/2017   Procedure: TOTAL KNEE ARTHROPLASTY;  Surgeon: Elsie Saas,  MD;  Location: [place];  Service: Orthopedics;  Laterality: Right;    There were no vitals filed for this visit.      Subjective Assessment - 09/21/17 0818    Subjective Patient arrives today stating he is doing well, he was busy this weekend and is seeing his MD on Monday   Pertinent History HTN, hx PVCs, R BBB, heart valve disorders, OSA, DM, Pacemaker (recent disfunction but MD has repaired and is monitoring), L partial knee, R knee replacement 08/26/17)   Patient Stated Goals be able to go on vacation and walk the beach    Currently in Pain? Yes   Pain Score 2    Pain Location Knee   Pain Orientation Right            [facility] PT Assessment - 09/21/17 0001      AROM   Right Knee Extension 2   Right Knee Flexion 107  towel assist supine                      [facility] Adult PT Treatment/Exercise - 09/21/17 0001      Knee/Hip Exercises: Stretches   Active Hamstring Stretch 3 reps;30 seconds   Active Hamstring Stretch Limitations 12 inch box    Knee: Self-Stretch to increase Flexion Right;10 seconds   Knee: Self-Stretch Limitations x10    Gastroc Stretch Both;3 reps;30 seconds   Gastroc Stretch Limitations slantboard      Manual Therapy   Manual Therapy Joint mobilization;Edema management   Manual therapy comments performed separate from all other servcies    Edema Management LE elevated, retrograde massage    Joint Mobilization tib on femur grade III; patella mobility grade III all directions              Balance Exercises - 09/21/17 0851      Balance Exercises: Standing   Tandem Stance Eyes open;Foam/compliant surface;3 reps;15 secs   SLS Eyes open;Solid surface;3 reps;10 secs           PT Education - 09/21/17 0857    Education provided Yes   Education Details results of general goal review    Person(s) Educated Patient   Methods Explanation   Comprehension Verbalized understanding          PT Short Term Goals - 09/21/17 0832      PT  SHORT TERM GOAL #1   Title Patient to demonstrate R knee ROM as being 0-115 in order to improve gait mechanics and overall mobility    Baseline 9/24- 2-107   Time 3   Period Weeks   Status On-going     PT SHORT TERM GOAL #2   Title Patient to be consistently ambulating with SPC, consistent heel-toe mechanics and minimal unsteadiness, in order to show improved balance and general  mobility    Baseline 9/24- SPC consistently    Time 3   Period Weeks   Status Achieved     PT SHORT TERM GOAL #3   Title Patient to be able to complete sit to stand with no UEs and no weight shift to L LE in order to show improved proprioception and strength in surgical limb    Baseline 9/24- continues to shift off of knee, goal ongoing    Time 3   Period Weeks   Status On-going     PT SHORT TERM GOAL #4   Title Patient to correctly and consistently perform appropriate HEP, to be updated PRN    Time 1   Period Weeks   Status Achieved           PT Long Term Goals - 09/21/17 9798      PT LONG TERM GOAL #1   Title Patient to demonstrate strength 5/5 in all tested muscle groups in order to assist in maintaining correct functional posture and reducing pain    Time 6   Period Weeks   Status On-going     PT LONG TERM GOAL #2   Title Patient to be able to ambulate over level surfaces with no device and unsteady/uneven surfaces with cane, correct heel-toe pattern and minimal unsteadiness, in order to improve mobiilty and improved home/community access    Time 6   Period Weeks   Status On-going     PT LONG TERM GOAL #3   Title Patient to be able to complete TUG in 12 seconds or less with no device and maintain SLS for at least 15 seconds on level surface in order to demonstrate improved functional balance    Time 6   Period Weeks   Status On-going     PT LONG TERM GOAL #4   Title Patient to reciprocally ascend/descend stairs with U railing, minimal unsteadiness, good eccentric control in order to  improve home/community access    Time 6   Period Weeks   Status Partially Met               Plan - 09/21/17 0858    Clinical Impression Statement Patient arrives today stating he is doing well, no major changes and he is seeing MD tomorrow; performed quick goal review today and routed note to MD prior to appointment. Otherwise continued focus on ROM and balance/pre-gait activities today, with patient continuing to show good performance and motivation overall. ROM is now approximately 2-107 degrees of motion, recommend continuing to attain at least 115 degrees total ROM on consistent basis moving forward.    Rehab Potential Excellent   Clinical Impairments Affecting Rehab Potential (+) motivated to participate, success with PT in the past   PT Frequency 3x / week   PT Duration 6 weeks   PT Treatment/Interventions ADLs/Self Care Home Management;Cryotherapy;Moist Heat;DME Instruction;Gait training;Stair training;Functional mobility training;Therapeutic activities;Therapeutic exercise;Balance training;Neuromuscular re-education;Patient/family education;Manual techniques;Scar mobilization;Passive range of motion;Dry needling;Taping   PT Next Visit Plan Continue to focus on balance and ROM    PT Home Exercise Plan Eval: continuew with HHPT HEP, increase focus on quad sets, heel slides, knee flexion and extension stretches    Consulted and Agree with Plan of Care Patient      Patient will benefit from skilled therapeutic intervention in order to improve the following deficits and impairments:  Abnormal gait, Decreased skin integrity, Pain, Improper body mechanics, Decreased coordination, Decreased mobility, Decreased scar mobility, Increased muscle spasms, Postural dysfunction,  Decreased activity tolerance, Decreased range of motion, Decreased strength, Hypomobility, Decreased balance, Decreased safety awareness, Difficulty walking, Impaired flexibility, Increased edema  Visit  Diagnosis: Stiffness of right knee, not elsewhere classified  Localized edema  Difficulty in walking, not elsewhere classified  Unsteadiness on feet     Problem List Patient Active Problem List   Diagnosis Date Noted  . Primary localized osteoarthritis of right knee 08/11/2017  . Asthma with acute exacerbation 12/08/2016  . Chest pain 10/20/2016  . HTN (hypertension) 10/20/2016  . Morbid obesity due to excess calories ([place]) 06/24/2016  . Cough variant asthma 02/12/2016  . Upper airway cough syndrome 01/24/2016  . 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 Essential hypertension   . Persistent atrial fibrillation ([place])   . Syncope   . Hyperthyroidism   . Hyperlipidemia   . RBBB (right bundle branch block)   . Tricuspid valve disorder   . Aortic valve disorder   . Carotid artery stenosis   . Diabetes ([place])   . Pacemaker   . OSA (obstructive sleep apnea)   . Arthritis   . Sinoatrial node dysfunction (HCC)   . PVC's (premature ventricular contractions) 07/17/2011  . Essential hypertension, benign 01/24/2011  . Premature ventricular contractions 01/24/2011  . PPM-St.Jude 01/24/2011    Deniece Ree PT, DPT [phone]  [facility] [address] [place], [place], [address] Phone: [phone]   Fax:  [phone]  Name: Clarence Dawson MRN: [id number] Date of Birth: 1946-02-25

## 2017-09-22 DIAGNOSIS — M1711 Unilateral primary osteoarthritis, right knee: Secondary | ICD-10-CM | POA: Diagnosis not present

## 2017-09-23 ENCOUNTER — Encounter (HOSPITAL_COMMUNITY): Payer: Self-pay

## 2017-09-23 ENCOUNTER — Other Ambulatory Visit: Payer: Self-pay | Admitting: Internal Medicine

## 2017-09-23 ENCOUNTER — Ambulatory Visit (HOSPITAL_COMMUNITY): Payer: Medicare Other

## 2017-09-23 DIAGNOSIS — R262 Difficulty in walking, not elsewhere classified: Secondary | ICD-10-CM

## 2017-09-23 DIAGNOSIS — M25661 Stiffness of right knee, not elsewhere classified: Secondary | ICD-10-CM

## 2017-09-23 DIAGNOSIS — R600 Localized edema: Secondary | ICD-10-CM

## 2017-09-23 DIAGNOSIS — R2681 Unsteadiness on feet: Secondary | ICD-10-CM

## 2017-09-23 NOTE — Therapy (Signed)
[place], [place], [address] Phone: [phone]   Fax:  [phone]  Physical Therapy Treatment  Patient Details  Name: Clarence Dawson MRN: [id number] Date of Birth: April 23, 1946 Referring Provider: Elsie Saas  Encounter Date: 09/23/2017      PT End of Session - 09/23/17 1128    Visit Number 7   Number of Visits 19   Date for PT Re-Evaluation 09/28/17   Authorization Type Medicare/[employer] Indemnity    Authorization Time Period 09/07/17 to 10/19/17   Authorization - Visit Number 7   Authorization - Number of Visits 10   PT Start Time 3748   PT Stop Time 1207   PT Time Calculation (min) 42 min   Activity Tolerance Patient tolerated treatment well;No increased pain   Behavior During Therapy [facility] for tasks assessed/performed      Past Medical History:  Diagnosis Date  . Allergic rhinitis   . Aortic valve disorder   . Asthma    since childhood- seasonal allergies induced  . Cancer ([place])    Skin cancer- squamous, basal  . Carotid artery stenosis   . Essential hypertension   . Full dentures   . GERD (gastroesophageal reflux disease)   . H/O hiatal hernia   . Hemorrhage of rectum   . Hyperlipidemia   . Hypothyroidism   . 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 OSA (obstructive sleep apnea)   . Osteoarthritis   . Pacemaker    Oct 2005 in [place].  Marland Kitchen PAF (paroxysmal atrial fibrillation) ([place])   . Pneumonia    "several Times" 2015 last time  . Primary localized osteoarthritis of right knee 08/11/2017  . RBBB (right bundle branch block)   . Sinoatrial node dysfunction (HCC)   . Syncope   . Tricuspid valve disorder   . Type 2 diabetes mellitus ([place])    Type II    Past Surgical History:  Procedure Laterality Date  . A-V CARDIAC PACEMAKER INSERTION     Sick sinus syndrome DDR pacer  . Arthropathy Right 2005   Rebuilding of left thumb and joint   . CARDIAC CATHETERIZATION    . CARDIAC ELECTROPHYSIOLOGY STUDY AND  ABLATION  09/2008   for pvcs, Dr. Loralie Champagne  . CARDIOVERSION N/A 12/18/2016   Procedure: CARDIOVERSION;  Surgeon: Pixie Casino, MD;  Location: [facility] ENDOSCOPY;  Service: Cardiovascular;  Laterality: N/A;  . [place]   right wrist  . CARPAL TUNNEL RELEASE  05/04/2012   Procedure: CARPAL TUNNEL RELEASE;  Surgeon: Wynonia Sours, MD;  Location: [place];  Service: Orthopedics;  Laterality: Left;  . CARPOMETACARPEL SUSPENSION PLASTY Right 11/16/2014   Procedure: SUSPENSIONPLASTY RIGHT THUMB TENDON TRANSFER ABDUCTOR POLLICUS LONGUS EXCISION TRAPEZIUM;  Surgeon: Daryll Brod, MD;  Location: [place];  Service: Orthopedics;  Laterality: Right;  . CHOLECYSTECTOMY  1994  . CIRCUMCISION    . COLONOSCOPY N/A 03/14/2013   Procedure: COLONOSCOPY;  Surgeon: Daneil Dolin, MD;  [facility];  Service: Endoscopy;  Laterality: N/A;  8:15 AM  . EYE SURGERY     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  . EYE SURGERY  2012   Left eye Corneal transplant- partial- Cataract  . GALLBLADDER SURGERY  12/01/2006  . HAND TENDON SURGERY Left late 1990's   thumb  . HEMORROIDECTOMY  2003  . INJECTION KNEE Left 08/26/2017   Procedure: LEFT KNEE INJECTION;  Surgeon: Elsie Saas, MD;  Location: [place];  Service:  Orthopedics;  Laterality: Left;  . left Knee Arthroscopy     April 21 2011- [facility] center  . PARTIAL KNEE ARTHROPLASTY  11/22/2012   Procedure: UNICOMPARTMENTAL KNEE;  Surgeon: Lorn Junes, MD;  Location: [place];  Service: Orthopedics;  Laterality: Left;  left unicompartmental knee arthroplasty  . PERMANENT PACEMAKER GENERATOR CHANGE N/A 01/12/2013   Procedure: PERMANENT PACEMAKER GENERATOR CHANGE;  Surgeon: Evans Lance, MD;  Location: [facility] CATH LAB;  Service: Cardiovascular;  Laterality: N/A;  . Rotator cuff Surgery  2001   Right shoulder  . TONSILLECTOMY    . TOTAL KNEE ARTHROPLASTY Right 08/26/2017   Procedure: TOTAL KNEE ARTHROPLASTY;   Surgeon: Elsie Saas, MD;  Location: [place];  Service: Orthopedics;  Laterality: Right;    There were no vitals filed for this visit.      Subjective Assessment - 09/23/17 1126    Subjective Pt stated knee is sore today, pain scale 3/10.  Reports he has increased his stretch routine more.  Reports difficulty sleeping due to Rt LE pain last night   Pertinent History HTN, hx PVCs, R BBB, heart valve disorders, OSA, DM, Pacemaker (recent disfunction but MD has repaired and is monitoring), L partial knee, R knee replacement 08/26/17)   Patient Stated Goals be able to go on vacation and walk the beach    Currently in Pain? Yes   Pain Score 3    Pain Location Knee   Pain Orientation Right   Pain Descriptors / Indicators Sore   Pain Type Acute pain   Pain Onset 1 to 4 weeks ago   Pain Frequency Constant   Aggravating Factors  activity   Pain Relieving Factors ice   Effect of Pain on Daily Activities moderate-severe                         [facility] Adult PT Treatment/Exercise - 09/23/17 0001      Knee/Hip Exercises: Stretches   Sports administrator Right;3 reps;30 seconds   Quad Stretch Limitations prone with sheet   Knee: Self-Stretch to increase Flexion Right;10 seconds   Knee: Self-Stretch Limitations x10      Knee/Hip Exercises: Standing   Stairs 5 RT reciprocal 1 HR   SLS 5x max Rt 7", Lt 6"   SLS with Vectors 3x 5" holds Rt SLS     Knee/Hip Exercises: Seated   Sit to Sand 10 reps;without UE support     Knee/Hip Exercises: Supine   Heel Slides 10 reps   Knee Extension Limitations 2   Knee Flexion Limitations 112 degrees     Knee/Hip Exercises: Prone   Contract/Relax to Increase Flexion 5     Manual Therapy   Manual Therapy Edema management;Passive ROM   Manual therapy comments performed separate from all other servcies    Edema Management LE elevated, retrograde massage    Soft tissue mobilization STM to quad and medial hamstrings   Passive ROM contract/relax  for flexion in prone 5 reps             Balance Exercises - 09/23/17 1202      Balance Exercises: Standing   Tandem Stance Eyes open;Foam/compliant surface;3 reps;30 secs   SLS Eyes open;Solid surface;5 reps  Rt 7", Lt 6"   SLS with Vectors 3 reps;Intermittent upper extremity assist  Rt LE SLS 3x 5" holds   Tandem Gait 1 rep   Sidestepping 1 rep;Theraband  RTB  PT Short Term Goals - 09/21/17 0832      PT SHORT TERM GOAL #1   Title Patient to demonstrate R knee ROM as being 0-115 in order to improve gait mechanics and overall mobility    Baseline 9/24- 2-107   Time 3   Period Weeks   Status On-going     PT SHORT TERM GOAL #2   Title Patient to be consistently ambulating with SPC, consistent heel-toe mechanics and minimal unsteadiness, in order to show improved balance and general mobility    Baseline 9/24- SPC consistently    Time 3   Period Weeks   Status Achieved     PT SHORT TERM GOAL #3   Title Patient to be able to complete sit to stand with no UEs and no weight shift to L LE in order to show improved proprioception and strength in surgical limb    Baseline 9/24- continues to shift off of knee, goal ongoing    Time 3   Period Weeks   Status On-going     PT SHORT TERM GOAL #4   Title Patient to correctly and consistently perform appropriate HEP, to be updated PRN    Time 1   Period Weeks   Status Achieved           PT Long Term Goals - 09/21/17 4944      PT LONG TERM GOAL #1   Title Patient to demonstrate strength 5/5 in all tested muscle groups in order to assist in maintaining correct functional posture and reducing pain    Time 6   Period Weeks   Status On-going     PT LONG TERM GOAL #2   Title Patient to be able to ambulate over level surfaces with no device and unsteady/uneven surfaces with cane, correct heel-toe pattern and minimal unsteadiness, in order to improve mobiilty and improved home/community access    Time 6    Period Weeks   Status On-going     PT LONG TERM GOAL #3   Title Patient to be able to complete TUG in 12 seconds or less with no device and maintain SLS for at least 15 seconds on level surface in order to demonstrate improved functional balance    Time 6   Period Weeks   Status On-going     PT LONG TERM GOAL #4   Title Patient to reciprocally ascend/descend stairs with U railing, minimal unsteadiness, good eccentric control in order to improve home/community access    Time 6   Period Weeks   Status Partially Met               Plan - 09/23/17 1210    Clinical Impression Statement Session focus with knee mobility and balance training.  Began session wiht manual retro massage for edema control prior ROM based therex.  Noted improved AROM 2-112 following manual contract/relax technqiues and edema control.  Pt reports increased ease this session wiht reciprocal stair training with improved mobility.  Balance training wiht min guard/A required with SLS and NBOS activities.  No reports of pain through session.     Rehab Potential Excellent   Clinical Impairments Affecting Rehab Potential (+) motivated to participate, success with PT in the past   PT Frequency 3x / week   PT Duration 6 weeks   PT Treatment/Interventions ADLs/Self Care Home Management;Cryotherapy;Moist Heat;DME Instruction;Gait training;Stair training;Functional mobility training;Therapeutic activities;Therapeutic exercise;Balance training;Neuromuscular re-education;Patient/family education;Manual techniques;Scar mobilization;Passive range of motion;Dry needling;Taping   PT Next Visit Plan  Continue to focus on balance and ROM    PT Home Exercise Plan Eval: continuew with HHPT HEP, increase focus on quad sets, heel slides, knee flexion and extension stretches       Patient will benefit from skilled therapeutic intervention in order to improve the following deficits and impairments:  Abnormal gait, Decreased skin integrity,  Pain, Improper body mechanics, Decreased coordination, Decreased mobility, Decreased scar mobility, Increased muscle spasms, Postural dysfunction, Decreased activity tolerance, Decreased range of motion, Decreased strength, Hypomobility, Decreased balance, Decreased safety awareness, Difficulty walking, Impaired flexibility, Increased edema  Visit Diagnosis: Stiffness of right knee, not elsewhere classified  Localized edema  Difficulty in walking, not elsewhere classified  Unsteadiness on feet     Problem List Patient Active Problem List   Diagnosis Date Noted  . Primary localized osteoarthritis of right knee 08/11/2017  . Asthma with acute exacerbation 12/08/2016  . Chest pain 10/20/2016  . HTN (hypertension) 10/20/2016  . Morbid obesity due to excess calories ([place]) 06/24/2016  . Cough variant asthma 02/12/2016  . Upper airway cough syndrome 01/24/2016  . 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 Essential hypertension   . Persistent atrial fibrillation ([place])   . Syncope   . Hyperthyroidism   . Hyperlipidemia   . RBBB (right bundle branch block)   . Tricuspid valve disorder   . Aortic valve disorder   . Carotid artery stenosis   . Diabetes ([place])   . Pacemaker   . OSA (obstructive sleep apnea)   . Arthritis   . Sinoatrial node dysfunction (HCC)   . PVC's (premature ventricular contractions) 07/17/2011  . Essential hypertension, benign 01/24/2011  . Premature ventricular contractions 01/24/2011  . PPM-St.Jude 01/24/2011   Ihor Austin, [place]; [place]  Aldona Lento 09/23/2017, 12:40 PM  [place], [place], [address] Phone: [phone]   Fax:  [phone]  Name: Clarence Dawson MRN: [id number] Date of Birth: [birth year]

## 2017-09-25 ENCOUNTER — Ambulatory Visit (HOSPITAL_COMMUNITY): Payer: Medicare Other

## 2017-09-25 ENCOUNTER — Encounter (HOSPITAL_COMMUNITY): Payer: Self-pay

## 2017-09-25 DIAGNOSIS — R262 Difficulty in walking, not elsewhere classified: Secondary | ICD-10-CM

## 2017-09-25 DIAGNOSIS — R2681 Unsteadiness on feet: Secondary | ICD-10-CM | POA: Diagnosis not present

## 2017-09-25 DIAGNOSIS — R600 Localized edema: Secondary | ICD-10-CM

## 2017-09-25 DIAGNOSIS — M25661 Stiffness of right knee, not elsewhere classified: Secondary | ICD-10-CM

## 2017-09-25 NOTE — Therapy (Signed)
[facility] [place], [place], [address] Phone: [phone]   Fax:  [phone]  Physical Therapy Treatment  Patient Details  Name: Clarence Dawson MRN: [id number] Date of Birth: 04/15/1946 Referring Provider: Elsie Saas  Encounter Date: 09/25/2017      PT End of Session - 09/25/17 1439    Visit Number 8   Number of Visits 19   Date for PT Re-Evaluation 09/28/17   Authorization Type Medicare/[employer] Indemnity    Authorization Time Period 09/07/17 to 10/19/17   Authorization - Visit Number 8   Authorization - Number of Visits 10   PT Start Time 1103   PT Stop Time 1520   PT Time Calculation (min) 45 min   Activity Tolerance Patient tolerated treatment well;No increased pain   Behavior During Therapy WFL for tasks assessed/performed      Past Medical History:  Diagnosis Date  . Allergic rhinitis   . Aortic valve disorder   . Asthma    since childhood- seasonal allergies induced  . Cancer ([place])    Skin cancer- squamous, basal  . Carotid artery stenosis   . Essential hypertension   . Full dentures   . GERD (gastroesophageal reflux disease)   . H/O hiatal hernia   . Hemorrhage of rectum   . Hyperlipidemia   . Hypothyroidism   . 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 OSA (obstructive sleep apnea)   . Osteoarthritis   . Pacemaker    Oct 2005 in [place].  Marland Kitchen PAF (paroxysmal atrial fibrillation) ([place])   . Pneumonia    "several Times" 2015 last time  . Primary localized osteoarthritis of right knee 08/11/2017  . RBBB (right bundle branch block)   . Sinoatrial node dysfunction (HCC)   . Syncope   . Tricuspid valve disorder   . Type 2 diabetes mellitus ([place])    Type II    Past Surgical History:  Procedure Laterality Date  . A-V CARDIAC PACEMAKER INSERTION     Sick sinus syndrome DDR pacer  . Arthropathy Right 2005   Rebuilding of left thumb and joint   . CARDIAC CATHETERIZATION    . CARDIAC ELECTROPHYSIOLOGY STUDY AND  ABLATION  09/2008   for pvcs, Dr. Loralie Champagne  . CARDIOVERSION N/A 12/18/2016   Procedure: CARDIOVERSION;  Surgeon: Pixie Casino, MD;  Location: [facility] ENDOSCOPY;  Service: Cardiovascular;  Laterality: N/A;  . Normal   right wrist  . CARPAL TUNNEL RELEASE  05/04/2012   Procedure: CARPAL TUNNEL RELEASE;  Surgeon: Wynonia Sours, MD;  Location: [facility];  Service: Orthopedics;  Laterality: Left;  . CARPOMETACARPEL SUSPENSION PLASTY Right 11/16/2014   Procedure: SUSPENSIONPLASTY RIGHT THUMB TENDON TRANSFER ABDUCTOR POLLICUS LONGUS EXCISION TRAPEZIUM;  Surgeon: Daryll Brod, MD;  Location: [facility];  Service: Orthopedics;  Laterality: Right;  . CHOLECYSTECTOMY  1994  . CIRCUMCISION    . COLONOSCOPY N/A 03/14/2013   Procedure: COLONOSCOPY;  Surgeon: Daneil Dolin, MD;  [facility];  Service: Endoscopy;  Laterality: N/A;  8:15 AM  . EYE SURGERY     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  . EYE SURGERY  2012   Left eye Corneal transplant- partial- Cataract  . GALLBLADDER SURGERY  12/01/2006  . HAND TENDON SURGERY Left late 1990's   thumb  . HEMORROIDECTOMY  2003  . INJECTION KNEE Left 08/26/2017   Procedure: LEFT KNEE INJECTION;  Surgeon: Elsie Saas, MD;  Location: [facility];  Service:  Orthopedics;  Laterality: Left;  . left Knee Arthroscopy     [facility]  . PARTIAL KNEE ARTHROPLASTY  11/22/2012   Procedure: UNICOMPARTMENTAL KNEE;  Surgeon: Lorn Junes, MD;  Location: [facility];  Service: Orthopedics;  Laterality: Left;  left unicompartmental knee arthroplasty  . PERMANENT PACEMAKER GENERATOR CHANGE N/A 01/12/2013   Procedure: PERMANENT PACEMAKER GENERATOR CHANGE;  Surgeon: Evans Lance, MD;  Location: [facility] CATH LAB;  Service: Cardiovascular;  Laterality: N/A;  . Rotator cuff Surgery  2001   Right shoulder  . TONSILLECTOMY    . TOTAL KNEE ARTHROPLASTY Right 08/26/2017   Procedure: TOTAL KNEE ARTHROPLASTY;   Surgeon: Elsie Saas, MD;  Location: [facility];  Service: Orthopedics;  Laterality: Right;    There were no vitals filed for this visit.      Subjective Assessment - 09/25/17 1437    Subjective Pt reports knee is feeling good today, was completeing stretches at home and feels a strain near groin especially with sit to stands.   Pertinent History HTN, hx PVCs, R BBB, heart valve disorders, OSA, DM, Pacemaker (recent disfunction but MD has repaired and is monitoring), L partial knee, R knee replacement 08/26/17)   Patient Stated Goals be able to go on vacation and walk the beach    Currently in Pain? Yes   Pain Score 2    Pain Location Knee   Pain Orientation Left   Pain Descriptors / Indicators Tightness;Sore   Pain Type Acute pain   Pain Onset 1 to 4 weeks ago   Pain Frequency Constant   Aggravating Factors  activity   Pain Relieving Factors ice   Effect of Pain on Daily Activities moderate-severe                         [facility] Adult PT Treatment/Exercise - 09/25/17 0001      Knee/Hip Exercises: Stretches   Active Hamstring Stretch 3 reps;30 seconds   Active Hamstring Stretch Limitations 12 inch box    Quad Stretch Right;3 reps;30 seconds   Quad Stretch Limitations prone with sheet   Knee: Self-Stretch to increase Flexion Right;10 seconds   Knee: Self-Stretch Limitations x10    Gastroc Stretch Both;3 reps;30 seconds   Gastroc Stretch Limitations slantboard      Knee/Hip Exercises: Standing   Stairs 5 RT reciprocal 1 HR     Knee/Hip Exercises: Supine   Knee Extension Limitations 3   Knee Flexion Limitations 115 degrees     Knee/Hip Exercises: Prone   Contract/Relax to Increase Flexion 5     Manual Therapy   Manual Therapy Edema management;Passive ROM   Manual therapy comments performed separate from all other servcies    Edema Management LE elevated, retrograde massage    Soft tissue mobilization STM to quad and medial hamstrings   Passive ROM  contract/relax for flexion in prone 5 reps             Balance Exercises - 09/25/17 1507      Balance Exercises: Standing   Tandem Stance Eyes open;Foam/compliant surface;3 reps;30 secs   SLS Eyes open;Solid surface;5 reps  Rt 8" Lt 11"   SLS with Vectors 3 reps;Intermittent upper extremity assist   Tandem Gait 1 rep             PT Short Term Goals - 09/21/17 2637      PT SHORT TERM GOAL #1   Title Patient to demonstrate R  knee ROM as being 0-115 in order to improve gait mechanics and overall mobility    Baseline 9/24- 2-107   Time 3   Period Weeks   Status On-going     PT SHORT TERM GOAL #2   Title Patient to be consistently ambulating with SPC, consistent heel-toe mechanics and minimal unsteadiness, in order to show improved balance and general mobility    Baseline 9/24- SPC consistently    Time 3   Period Weeks   Status Achieved     PT SHORT TERM GOAL #3   Title Patient to be able to complete sit to stand with no UEs and no weight shift to L LE in order to show improved proprioception and strength in surgical limb    Baseline 9/24- continues to shift off of knee, goal ongoing    Time 3   Period Weeks   Status On-going     PT SHORT TERM GOAL #4   Title Patient to correctly and consistently perform appropriate HEP, to be updated PRN    Time 1   Period Weeks   Status Achieved           PT Long Term Goals - 09/21/17 2248      PT LONG TERM GOAL #1   Title Patient to demonstrate strength 5/5 in all tested muscle groups in order to assist in maintaining correct functional posture and reducing pain    Time 6   Period Weeks   Status On-going     PT LONG TERM GOAL #2   Title Patient to be able to ambulate over level surfaces with no device and unsteady/uneven surfaces with cane, correct heel-toe pattern and minimal unsteadiness, in order to improve mobiilty and improved home/community access    Time 6   Period Weeks   Status On-going     PT LONG TERM  GOAL #3   Title Patient to be able to complete TUG in 12 seconds or less with no device and maintain SLS for at least 15 seconds on level surface in order to demonstrate improved functional balance    Time 6   Period Weeks   Status On-going     PT LONG TERM GOAL #4   Title Patient to reciprocally ascend/descend stairs with U railing, minimal unsteadiness, good eccentric control in order to improve home/community access    Time 6   Period Weeks   Status Partially Met               Plan - 09/25/17 1538    Clinical Impression Statement Pt progressing well with knee mobility and function.  Reports increased confidence and performance with stairs at home and begun to [place].  continued session focus with knee mobility initiail session then progressed to functional strengthening and balance training.  AROM at 3-115 degrees at EOS this session following stretches and manual technqiues (contract/relax with great results and edema control).  Pt continues to demonstrate weakness and difficulty with balance activities this session with min A for safety on SLS and dynamic surfaces.  No reports of pain in knee or groin through session.     Rehab Potential Excellent   Clinical Impairments Affecting Rehab Potential (+) motivated to participate, success with PT in the past   PT Frequency 3x / week   PT Duration 6 weeks   PT Treatment/Interventions ADLs/Self Care Home Management;Cryotherapy;Moist Heat;DME Instruction;Gait training;Stair training;Functional mobility training;Therapeutic activities;Therapeutic exercise;Balance training;Neuromuscular re-education;Patient/family education;Manual techniques;Scar mobilization;Passive range of motion;Dry needling;Taping  PT Next Visit Plan Continue to focus on balance and ROM    PT Home Exercise Plan Eval: continuew with HHPT HEP, increase focus on quad sets, heel slides, knee flexion and extension stretches       Patient will benefit from skilled  therapeutic intervention in order to improve the following deficits and impairments:  Abnormal gait, Decreased skin integrity, Pain, Improper body mechanics, Decreased coordination, Decreased mobility, Decreased scar mobility, Increased muscle spasms, Postural dysfunction, Decreased activity tolerance, Decreased range of motion, Decreased strength, Hypomobility, Decreased balance, Decreased safety awareness, Difficulty walking, Impaired flexibility, Increased edema  Visit Diagnosis: Stiffness of right knee, not elsewhere classified  Localized edema  Difficulty in walking, not elsewhere classified  Unsteadiness on feet     Problem List Patient Active Problem List   Diagnosis Date Noted  . Primary localized osteoarthritis of right knee 08/11/2017  . Asthma with acute exacerbation 12/08/2016  . Chest pain 10/20/2016  . HTN (hypertension) 10/20/2016  . Morbid obesity due to excess calories ([place]) 06/24/2016  . Cough variant asthma 02/12/2016  . Upper airway cough syndrome 01/24/2016  . 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 Essential hypertension   . Persistent atrial fibrillation ([place])   . Syncope   . Hyperthyroidism   . Hyperlipidemia   . RBBB (right bundle branch block)   . Tricuspid valve disorder   . Aortic valve disorder   . Carotid artery stenosis   . Diabetes ([place])   . Pacemaker   . OSA (obstructive sleep apnea)   . Arthritis   . Sinoatrial node dysfunction (HCC)   . PVC's (premature ventricular contractions) 07/17/2011  . Essential hypertension, benign 01/24/2011  . Premature ventricular contractions 01/24/2011  . PPM-St.Jude 01/24/2011   Ihor Austin, LPTA; [place]  Aldona Lento 09/25/2017, 4:15 PM  [facility] [address] [place], [place], [address] Phone: [phone]   Fax:  [phone]  Name: Clarence Dawson MRN: [id number] Date of Birth: 26-Dec-1946

## 2017-09-28 ENCOUNTER — Ambulatory Visit (HOSPITAL_COMMUNITY): Payer: Medicare Other | Attending: Orthopedic Surgery | Admitting: Physical Therapy

## 2017-09-28 DIAGNOSIS — R2681 Unsteadiness on feet: Secondary | ICD-10-CM | POA: Diagnosis not present

## 2017-09-28 DIAGNOSIS — R600 Localized edema: Secondary | ICD-10-CM | POA: Diagnosis not present

## 2017-09-28 DIAGNOSIS — M25661 Stiffness of right knee, not elsewhere classified: Secondary | ICD-10-CM | POA: Diagnosis not present

## 2017-09-28 DIAGNOSIS — R262 Difficulty in walking, not elsewhere classified: Secondary | ICD-10-CM | POA: Diagnosis not present

## 2017-09-28 DIAGNOSIS — M6281 Muscle weakness (generalized): Secondary | ICD-10-CM | POA: Diagnosis not present

## 2017-09-28 NOTE — Therapy (Signed)
[facility] [address] [place], [place], [address] Phone: [phone]   Fax:  [phone]  Physical Therapy Treatment  Patient Details  Name: Clarence Dawson MRN: [id number] Date of Birth: 09/17/1946 Referring Provider: Salvatore Marvel  Encounter Date: 09/28/2017      PT End of Session - 09/28/17 1146    Visit Number 9   Number of Visits 19   Date for PT Re-Evaluation 09/28/17   Authorization Type Medicare/[employer] Indemnity    Authorization Time Period 09/07/17 to 10/19/17   Authorization - Visit Number 9   Authorization - Number of Visits 10   PT Start Time 1118   PT Stop Time 1158   PT Time Calculation (min) 40 min   Activity Tolerance Patient tolerated treatment well;No increased pain   Behavior During Therapy WFL for tasks assessed/performed      Past Medical History:  Diagnosis Date  . Allergic rhinitis   . Aortic valve disorder   . Asthma    since childhood- seasonal allergies induced  . Cancer (H[facility])    Skin cancer- squamous, basal  . Carotid artery stenosis   . Essential hypertension   . Full dentures   . GERD (gastroesophageal reflux disease)   . H/O hiatal hernia   . Hemorrhage of rectum   . Hyperlipidemia   . Hypothyroidism   . 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 OSA (obstructive sleep apnea)   . Osteoarthritis   . Pacemaker    Oct 2005 in [place].  Marland Kitchen PAF (paroxysmal atrial fibrillation) (H[facility])   . Pneumonia    "several Times" 2015 last time  . Primary localized osteoarthritis of right knee 08/11/2017  . RBBB (right bundle branch block)   . Sinoatrial node dysfunction (HCC)   . Syncope   . Tricuspid valve disorder   . Type 2 diabetes mellitus (H[facility])    Type II    Past Surgical History:  Procedure Laterality Date  . A-V CARDIAC PACEMAKER INSERTION     Sick sinus syndrome DDR pacer  . Arthropathy Right 2005   Rebuilding of left thumb and joint   . CARDIAC CATHETERIZATION    . CARDIAC ELECTROPHYSIOLOGY STUDY AND  ABLATION  09/2008   for pvcs, Dr. Vesta Mixer  . CARDIOVERSION N/A 12/18/2016   Procedure: CARDIOVERSION;  Surgeon: Chrystie Nose, MD;  Location: [facility] ENDOSCOPY;  Service: Cardiovascular;  Laterality: N/A;  . CARPAL TUNNEL RELEASE  1994   right wrist  . CARPAL TUNNEL RELEASE  05/04/2012   Procedure: CARPAL TUNNEL RELEASE;  Surgeon: Nicki Reaper, MD;  Location: [facility];  Service: Orthopedics;  Laterality: Left;  . CARPOMETACARPEL SUSPENSION PLASTY Right 11/16/2014   Procedure: SUSPENSIONPLASTY RIGHT THUMB TENDON TRANSFER ABDUCTOR POLLICUS LONGUS EXCISION TRAPEZIUM;  Surgeon: Cindee Salt, MD;  Location: [facility];  Service: Orthopedics;  Laterality: Right;  . CHOLECYSTECTOMY  1994  . CIRCUMCISION    . COLONOSCOPY N/A 03/14/2013   Procedure: COLONOSCOPY;  Surgeon: Corbin Ade, MD;  [facility];  Service: Endoscopy;  Laterality: N/A;  8:15 AM  . EYE SURGERY     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  . EYE SURGERY  2012   Left eye Corneal transplant- partial- Cataract  . GALLBLADDER SURGERY  12/01/2006  . HAND TENDON SURGERY Left late 1990's   thumb  . HEMORROIDECTOMY  2003  . INJECTION KNEE Left 08/26/2017   Procedure: LEFT KNEE INJECTION;  Surgeon: Salvatore Marvel, MD;  Location: [facility] OR;  Service:  Orthopedics;  Laterality: Left;  . left Knee Arthroscopy     April 21 2011- [facility] center  . PARTIAL KNEE ARTHROPLASTY  11/22/2012   Procedure: UNICOMPARTMENTAL KNEE;  Surgeon: Lorn Junes, MD;  Location: [facility];  Service: Orthopedics;  Laterality: Left;  left unicompartmental knee arthroplasty  . PERMANENT PACEMAKER GENERATOR CHANGE N/A 01/12/2013   Procedure: PERMANENT PACEMAKER GENERATOR CHANGE;  Surgeon: Evans Lance, MD;  Location: [facility] CATH LAB;  Service: Cardiovascular;  Laterality: N/A;  . Rotator cuff Surgery  2001   Right shoulder  . TONSILLECTOMY    . TOTAL KNEE ARTHROPLASTY Right 08/26/2017   Procedure: TOTAL KNEE ARTHROPLASTY;   Surgeon: Elsie Saas, MD;  Location: [facility];  Service: Orthopedics;  Laterality: Right;    There were no vitals filed for this visit.                       [facility] Adult PT Treatment/Exercise - 09/28/17 0001      Knee/Hip Exercises: Standing   Other Standing Knee Exercises tandem standing wiht B UE flexion using 1" bar 10 reps each   Other Standing Knee Exercises baalnce beam tandem and sidestepping 2RT each     Knee/Hip Exercises: Supine   Knee Extension Limitations 3   Knee Flexion Limitations 115 degrees     Manual Therapy   Manual Therapy Edema management;Passive ROM   Manual therapy comments performed separate from all other servcies    Edema Management LE elevated, retrograde massage    Soft tissue mobilization STM to quad and medial hamstrings   Passive ROM contract/relax for flexion in prone 5 reps             Balance Exercises - 09/28/17 1141      Balance Exercises: Standing   Tandem Stance Eyes open;Foam/compliant surface;3 reps;30 secs   SLS Eyes open;Solid surface;5 reps  max 6" bilaterally without UE assist   SLS with Vectors 5 reps;Intermittent upper extremity assist  3" holds           PT Education - 09/28/17 1656    Education provided Yes   Education Details explained difference between TEDS and compression garments.  Pt with 20-30 thigh highs and suggested these would work better to help contain swelling.    Person(s) Educated Patient   Methods Explanation   Comprehension Verbalized understanding          PT Short Term Goals - 09/28/17 1352      PT SHORT TERM GOAL #1   Title Patient to demonstrate R knee ROM as being 0-115 in order to improve gait mechanics and overall mobility    Baseline 9/24- 2-107   Time 3   Period Weeks   Status On-going     PT SHORT TERM GOAL #2   Title Patient to be consistently ambulating with SPC, consistent heel-toe mechanics and minimal unsteadiness, in order to show improved balance and  general mobility    Baseline 9/24- SPC consistently    Time 3   Period Weeks   Status Achieved     PT SHORT TERM GOAL #3   Title Patient to be able to complete sit to stand with no UEs and no weight shift to L LE in order to show improved proprioception and strength in surgical limb    Baseline 9/24- continues to shift off of knee, goal ongoing    Time 3   Period Weeks   Status On-going  PT SHORT TERM GOAL #4   Title Patient to correctly and consistently perform appropriate HEP, to be updated PRN    Time 1   Period Weeks   Status Achieved           PT Long Term Goals - 09/21/17 7517      PT LONG TERM GOAL #1   Title Patient to demonstrate strength 5/5 in all tested muscle groups in order to assist in maintaining correct functional posture and reducing pain    Time 6   Period Weeks   Status On-going     PT LONG TERM GOAL #2   Title Patient to be able to ambulate over level surfaces with no device and unsteady/uneven surfaces with cane, correct heel-toe pattern and minimal unsteadiness, in order to improve mobiilty and improved home/community access    Time 6   Period Weeks   Status On-going     PT LONG TERM GOAL #3   Title Patient to be able to complete TUG in 12 seconds or less with no device and maintain SLS for at least 15 seconds on level surface in order to demonstrate improved functional balance    Time 6   Period Weeks   Status On-going     PT LONG TERM GOAL #4   Title Patient to reciprocally ascend/descend stairs with U railing, minimal unsteadiness, good eccentric control in order to improve home/community access    Time 6   Period Weeks   Status Partially Met               Plan - 09/28/17 1347    Clinical Impression Statement pt is doing well overall with ROM at 3-115 and minimal functional impairments.  Completed manual first this session prior to therex to improve mobility. continued focus on baalnce actvities with addition of tandem and  sidestepping on airex balance beam.  Pt with more difficulty maintaining anterior/posterior stability with sidestepping than lateral stability. Pt has progressed well towards goals and feels he may be able to be discharged.   Pt to consider any other functional impairements he may have prior to next session.    Rehab Potential Excellent   Clinical Impairments Affecting Rehab Potential (+) motivated to participate, success with PT in the past   PT Frequency 3x / week   PT Duration 6 weeks   PT Treatment/Interventions ADLs/Self Care Home Management;Cryotherapy;Moist Heat;DME Instruction;Gait training;Stair training;Functional mobility training;Therapeutic activities;Therapeutic exercise;Balance training;Neuromuscular re-education;Patient/family education;Manual techniques;Scar mobilization;Passive range of motion;Dry needling;Taping   PT Next Visit Plan Continue to focus on balance and ROM. Possible discharge    PT Home Exercise Plan Eval: continuew with HHPT HEP, increase focus on quad sets, heel slides, knee flexion and extension stretches       Patient will benefit from skilled therapeutic intervention in order to improve the following deficits and impairments:  Abnormal gait, Decreased skin integrity, Pain, Improper body mechanics, Decreased coordination, Decreased mobility, Decreased scar mobility, Increased muscle spasms, Postural dysfunction, Decreased activity tolerance, Decreased range of motion, Decreased strength, Hypomobility, Decreased balance, Decreased safety awareness, Difficulty walking, Impaired flexibility, Increased edema  Visit Diagnosis: Stiffness of right knee, not elsewhere classified  Localized edema  Difficulty in walking, not elsewhere classified  Unsteadiness on feet     Problem List Patient Active Problem List   Diagnosis Date Noted  . Primary localized osteoarthritis of right knee 08/11/2017  . Asthma with acute exacerbation 12/08/2016  . Chest pain  10/20/2016  . HTN (hypertension) 10/20/2016  .  Morbid obesity due to excess calories ([place]) 06/24/2016  . Cough variant asthma 02/12/2016  . Upper airway cough syndrome 01/24/2016  . 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 Essential hypertension   . Persistent atrial fibrillation ([place])   . Syncope   . Hyperthyroidism   . Hyperlipidemia   . RBBB (right bundle branch block)   . Tricuspid valve disorder   . Aortic valve disorder   . Carotid artery stenosis   . Diabetes ([place])   . Pacemaker   . OSA (obstructive sleep apnea)   . Arthritis   . Sinoatrial node dysfunction (H[facility])   . PVC's (premature ventricular contractions) 07/17/2011  . Essential hypertension, benign 01/24/2011  . Premature ventricular contractions 01/24/2011  . PPM-St.Jude 01/24/2011    Roseanne Reno B 09/28/2017, 4:57 PM  [facility] [place], [place], [address] Phone: [phone]   Fax:  [phone]  Name: Clarence Dawson MRN: [id number] Date of Birth: 1946-01-01

## 2017-09-30 ENCOUNTER — Ambulatory Visit (HOSPITAL_COMMUNITY): Payer: Medicare Other | Admitting: Physical Therapy

## 2017-09-30 ENCOUNTER — Encounter (HOSPITAL_COMMUNITY): Payer: Self-pay | Admitting: Physical Therapy

## 2017-09-30 DIAGNOSIS — R600 Localized edema: Secondary | ICD-10-CM | POA: Diagnosis not present

## 2017-09-30 DIAGNOSIS — M6281 Muscle weakness (generalized): Secondary | ICD-10-CM | POA: Diagnosis not present

## 2017-09-30 DIAGNOSIS — R262 Difficulty in walking, not elsewhere classified: Secondary | ICD-10-CM

## 2017-09-30 DIAGNOSIS — R2681 Unsteadiness on feet: Secondary | ICD-10-CM | POA: Diagnosis not present

## 2017-09-30 DIAGNOSIS — M25661 Stiffness of right knee, not elsewhere classified: Secondary | ICD-10-CM

## 2017-09-30 NOTE — Therapy (Signed)
[facility] [address] [place], [place], [address] Phone: [phone]   Fax:  [phone]  Physical Therapy Treatment (Re-Assessment)  Patient Details  Name: Clarence Dawson MRN: [id number] Date of Birth: 09-Jun-1946 Referring Provider: Elsie Saas  Encounter Date: 09/30/2017      PT End of Session - 09/30/17 1206    Visit Number 10   Number of Visits 19   Date for PT Re-Evaluation 10/16/17   Authorization Type Medicare/[employer] Indemnity (G-codes done 10th session)   Authorization Time Period 09/07/17 to 10/19/17   Authorization - Visit Number 10   Authorization - Number of Visits 20   PT Start Time 1117   PT Stop Time 1157   PT Time Calculation (min) 40 min   Activity Tolerance Patient tolerated treatment well   Behavior During Therapy [facility] for tasks assessed/performed      Past Medical History:  Diagnosis Date  . Allergic rhinitis   . Aortic valve disorder   . Asthma    since childhood- seasonal allergies induced  . Cancer ([place])    Skin cancer- squamous, basal  . Carotid artery stenosis   . Essential hypertension   . Full dentures   . GERD (gastroesophageal reflux disease)   . H/O hiatal hernia   . Hemorrhage of rectum   . Hyperlipidemia   . Hypothyroidism   . 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 OSA (obstructive sleep apnea)   . Osteoarthritis   . Pacemaker    Oct 2005 in [place].  Marland Kitchen PAF (paroxysmal atrial fibrillation) ([place])   . Pneumonia    "several Times" 2015 last time  . Primary localized osteoarthritis of right knee 08/11/2017  . RBBB (right bundle branch block)   . Sinoatrial node dysfunction (HCC)   . Syncope   . Tricuspid valve disorder   . Type 2 diabetes mellitus ([place])    Type II    Past Surgical History:  Procedure Laterality Date  . A-V CARDIAC PACEMAKER INSERTION     Sick sinus syndrome DDR pacer  . Arthropathy Right 2005   Rebuilding of left thumb and joint   . CARDIAC CATHETERIZATION    . CARDIAC  ELECTROPHYSIOLOGY STUDY AND ABLATION  09/2008   for pvcs, Dr. Loralie Champagne  . CARDIOVERSION N/A 12/18/2016   Procedure: CARDIOVERSION;  Surgeon: Pixie Casino, MD;  Location: [facility]COPY;  Service: Cardiovascular;  Laterality: N/A;  . [place]   right wrist  . CARPAL TUNNEL RELEASE  05/04/2012   Procedure: CARPAL TUNNEL RELEASE;  Surgeon: Wynonia Sours, MD;  Location: [place];  Service: Orthopedics;  Laterality: Left;  . CARPOMETACARPEL SUSPENSION PLASTY Right 11/16/2014   Procedure: SUSPENSIONPLASTY RIGHT THUMB TENDON TRANSFER ABDUCTOR POLLICUS LONGUS EXCISION TRAPEZIUM;  Surgeon: Daryll Brod, MD;  Location: [place];  Service: Orthopedics;  Laterality: Right;  . CHOLECYSTECTOMY  1994  . CIRCUMCISION    . COLONOSCOPY N/A 03/14/2013   Procedure: COLONOSCOPY;  Surgeon: Daneil Dolin, MD;  [facility];  Service: Endoscopy;  Laterality: N/A;  8:15 AM  . EYE SURGERY     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-[facility]  . EYE SURGERY  2012   Left eye Corneal transplant- partial- Cataract  . GALLBLADDER SURGERY  12/01/2006  . HAND TENDON SURGERY Left late 1990's   thumb  . HEMORROIDECTOMY  2003  . INJECTION KNEE Left 08/26/2017   Procedure: LEFT KNEE INJECTION;  Surgeon: Elsie Saas, MD;  Location: [place];  Service: Orthopedics;  Laterality: Left;  . left Knee Arthroscopy     [facility]- [facility]  . PARTIAL KNEE ARTHROPLASTY  11/22/2012   Procedure: UNICOMPARTMENTAL KNEE;  Surgeon: Lorn Junes, MD;  Location: [place];  Service: Orthopedics;  Laterality: Left;  left unicompartmental knee arthroplasty  . PERMANENT PACEMAKER GENERATOR CHANGE N/A 01/12/2013   Procedure: PERMANENT PACEMAKER GENERATOR CHANGE;  Surgeon: Evans Lance, MD;  [facility] C[facility] LAB;  Service: Cardiovascular;  Laterality: N/A;  . Rotator cuff Surgery  2001   Right shoulder  . TONSILLECTOMY    . TOTAL KNEE ARTHROPLASTY Right 08/26/2017   Procedure:  TOTAL KNEE ARTHROPLASTY;  Surgeon: Elsie Saas, MD;  Location: [place];  Service: Orthopedics;  Laterality: Right;    There were no vitals filed for this visit.      Subjective Assessment - 09/30/17 1119    Subjective Patient states that he really feels like he is getting better, he feels like hei s making progress. His knee did ache quite a bit yesterday though, especially when he was trying to sleep last night. He cannot think of anything that he has trouble doing functionally.    Pertinent History HTN, hx PVCs, R BBB, heart valve disorders, OSA, DM, Pacemaker (recent disfunction but MD has repaired and is monitoring), L partial knee, R knee replacement 08/26/17)   How long can you sit comfortably? 10/3- 30 minutes    How long can you stand comfortably? 10/3- 20-30 minutes    How long can you walk comfortably? 10/3- at his own pace, around 30-45 minutes    Patient Stated Goals be able to go on vacation and walk the beach    Currently in Pain? Yes   Pain Score 2    Pain Location Knee   Pain Orientation Left            [facility] PT Assessment - 09/30/17 0001      AROM   Right Knee Extension 5   Right Knee Flexion 107     Strength   Right Hip Flexion 5/5   Right Hip ABduction 4+/5   Left Hip Flexion 5/5   Left Hip ABduction 5/5   Right Knee Flexion 4+/5   Right Knee Extension 5/5   Left Knee Flexion 4+/5   Left Knee Extension 5/5   Right Ankle Dorsiflexion 5/5   Left Ankle Dorsiflexion 5/5     Ambulation/Gait   Gait Comments mild difficulty with stairs, U railing      6 minute walk test results    Aerobic Endurance Distance Walked 728   Endurance additional comments 3MWT no device      High Level Balance   High Level Balance Comments TUG 8.75                          Balance Exercises - 09/30/17 1151      Balance Exercises: Standing   Tandem Stance Eyes open;Foam/compliant surface;3 reps;20 secs   Rockerboard  Anterior/posterior;Lateral;Intermittent UE support  1 min each            PT Education - 09/30/17 1205    Education provided Yes   Education Details findings of re-assessment, POC moving forward, possible benefits of continuing with PT vs risk of discharging at this point   Person(s) Educated Patient   Methods Explanation   Comprehension Verbalized understanding          PT Short Term Goals -  09/30/17 1139      PT SHORT TERM GOAL #1   Title Patient to demonstrate R knee ROM as being 0-115 in order to improve gait mechanics and overall mobility    Baseline 10/3- 5-107   Time 3   Period Weeks   Status On-going     PT SHORT TERM GOAL #2   Title Patient to be consistently ambulating with SPC, consistent heel-toe mechanics and minimal unsteadiness, in order to show improved balance and general mobility    Baseline 10/3- looking great    Time 3   Period Weeks   Status Achieved     PT SHORT TERM GOAL #3   Title Patient to be able to complete sit to stand with no UEs and no weight shift to L LE in order to show improved proprioception and strength in surgical limb    Baseline 10/3-  continues to have offshift    Time 3   Period Weeks   Status On-going     PT SHORT TERM GOAL #4   Title Patient to correctly and consistently perform appropriate HEP, to be updated PRN    Baseline 10/3- compliant    Time 1   Period Weeks   Status Achieved           PT Long Term Goals - 09/30/17 1142      PT LONG TERM GOAL #1   Title Patient to demonstrate strength 5/5 in all tested muscle groups in order to assist in maintaining correct functional posture and reducing pain    Baseline 10/3- virtually met    Time 6   Period Weeks   Status Achieved     PT LONG TERM GOAL #2   Title Patient to be able to ambulate over level surfaces with no device and unsteady/uneven surfaces with cane, correct heel-toe pattern and minimal unsteadiness, in order to improve mobiilty and improved  home/community access    Baseline 10/3- continues to use AD outside    Time 6   Period Weeks   Status On-going     PT LONG TERM GOAL #3   Title Patient to be able to complete TUG in 12 seconds or less with no device and maintain SLS for at least 15 seconds on level surface in order to demonstrate improved functional balance    Baseline 10/3- 8.75    Time 6   Period Weeks   Status Achieved     PT LONG TERM GOAL #4   Title Patient to reciprocally ascend/descend stairs with U railing, minimal unsteadiness, good eccentric control in order to improve home/community access    Baseline 10/3- functional performance, mild difficulty    Time 6   Period Weeks   Status Partially Met               Plan - 09/30/17 1207    Clinical Impression Statement Re-assessment performed today. Patient does show objective improvements however also continues to demonstrate significant functional impairments including surgical knee stiffness, impaired balance especially on unstable surfaces, and mild difficulty with stairs; he also continues to have difficulty with edema surgical LE. Patient is improving but will benefit from continuation of skilled PT services to address remaining functional deficits moving forward.    Rehab Potential Excellent   Clinical Impairments Affecting Rehab Potential (+) motivated to participate, success with PT in the past   PT Frequency 3x / week   PT Duration 3 weeks   PT Treatment/Interventions ADLs/Self Care Home Management;Cryotherapy;Moist Heat;DME  Instruction;Gait training;Stair training;Functional mobility training;Therapeutic activities;Therapeutic exercise;Balance training;Neuromuscular re-education;Patient/family education;Manual techniques;Scar mobilization;Passive range of motion;Dry needling;Taping   PT Next Visit Plan continue ROM work, Hotel manager functional strengthening and neuro-re ed, continue balance. Patient not ready for discharge    PT Home Exercise Plan  Eval: continuew with HHPT HEP, increase focus on quad sets, heel slides, knee flexion and extension stretches    Consulted and Agree with Plan of Care Patient      Patient will benefit from skilled therapeutic intervention in order to improve the following deficits and impairments:  Abnormal gait, Decreased skin integrity, Pain, Improper body mechanics, Decreased coordination, Decreased mobility, Decreased scar mobility, Increased muscle spasms, Postural dysfunction, Decreased activity tolerance, Decreased range of motion, Decreased strength, Hypomobility, Decreased balance, Decreased safety awareness, Difficulty walking, Impaired flexibility, Increased edema  Visit Diagnosis: Stiffness of right knee, not elsewhere classified  Localized edema  Difficulty in walking, not elsewhere classified  Unsteadiness on feet       G-Codes - October 23, 2017 02/20/07    Functional Assessment Tool Used (Outpatient Only) Based on skilled clinical assessment of ROM, strength, gait, balance   Functional Limitation Mobility: Walking and moving around   Mobility: Walking and Moving Around Current Status (I0165) At least 20 percent but less than 40 percent impaired, limited or restricted   Mobility: Walking and Moving Around Goal Status [phone]) At least 1 percent but less than 20 percent impaired, limited or restricted      Problem List Patient Active Problem List   Diagnosis Date Noted  . Primary localized osteoarthritis of right knee 08/11/2017  . Asthma with acute exacerbation 12/08/2016  . Chest pain 10/20/2016  . HTN (hypertension) 10/20/2016  . Morbid obesity due to excess calories ([place]) 06/24/2016  . Cough variant asthma 02/12/2016  . Upper airway cough syndrome 01/24/2016  . 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 Essential hypertension   . Persistent atrial fibrillation ([place])   . Syncope   . Hyperthyroidism   . Hyperlipidemia   . RBBB (right bundle branch block)   . Tricuspid valve disorder   . Aortic valve  disorder   . Carotid artery stenosis   . Diabetes ([place])   . Pacemaker   . OSA (obstructive sleep apnea)   . Arthritis   . Sinoatrial node dysfunction (HCC)   . PVC's (premature ventricular contractions) 07/17/2011  . Essential hypertension, benign 01/24/2011  . Premature ventricular contractions 01/24/2011  . PPM-St.Jude 01/24/2011    Deniece Ree PT, DPT [phone]  [facility] [address] [place], [place], [address] Phone: [phone]   Fax:  [phone]  Name: Clarence Dawson MRN: [id number] Date of Birth: [birth year]

## 2017-10-02 ENCOUNTER — Ambulatory Visit (HOSPITAL_COMMUNITY): Payer: Medicare Other

## 2017-10-02 ENCOUNTER — Encounter (HOSPITAL_COMMUNITY): Payer: Self-pay

## 2017-10-02 DIAGNOSIS — R262 Difficulty in walking, not elsewhere classified: Secondary | ICD-10-CM

## 2017-10-02 DIAGNOSIS — M6281 Muscle weakness (generalized): Secondary | ICD-10-CM | POA: Diagnosis not present

## 2017-10-02 DIAGNOSIS — R2681 Unsteadiness on feet: Secondary | ICD-10-CM | POA: Diagnosis not present

## 2017-10-02 DIAGNOSIS — R600 Localized edema: Secondary | ICD-10-CM | POA: Diagnosis not present

## 2017-10-02 DIAGNOSIS — M25661 Stiffness of right knee, not elsewhere classified: Secondary | ICD-10-CM

## 2017-10-02 DIAGNOSIS — Z23 Encounter for immunization: Secondary | ICD-10-CM | POA: Diagnosis not present

## 2017-10-02 NOTE — Therapy (Signed)
[place], [place], [address] Phone: [phone]   Fax:  [phone]  Physical Therapy Treatment  Patient Details  Name: Clarence Dawson MRN: [id number] Date of Birth: 01-30-1946 Referring Provider: Elsie Saas  Encounter Date: 10/02/2017      PT End of Session - 10/02/17 1129    Visit Number 11   Number of Visits 19   Date for PT Re-Evaluation 10/16/17   Authorization Type Medicare/[employer] Indemnity (G-codes done 10th session)   Authorization Time Period 09/07/17 to 10/19/17   Authorization - Visit Number 11   Authorization - Number of Visits 20   PT Start Time 6283   PT Stop Time 1205   PT Time Calculation (min) 42 min   Activity Tolerance Patient tolerated treatment well   Behavior During Therapy [facility] for tasks assessed/performed      Past Medical History:  Diagnosis Date  . Allergic rhinitis   . Aortic valve disorder   . Asthma    since childhood- seasonal allergies induced  . Cancer ([place])    Skin cancer- squamous, basal  . Carotid artery stenosis   . Essential hypertension   . Full dentures   . GERD (gastroesophageal reflux disease)   . H/O hiatal hernia   . Hemorrhage of rectum   . Hyperlipidemia   . Hypothyroidism   . 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 OSA (obstructive sleep apnea)   . Osteoarthritis   . Pacemaker    Oct 2005 in [place].  Marland Kitchen PAF (paroxysmal atrial fibrillation) ([place])   . Pneumonia    "several Times" 2015 last time  . Primary localized osteoarthritis of right knee 08/11/2017  . RBBB (right bundle branch block)   . Sinoatrial node dysfunction (HCC)   . Syncope   . Tricuspid valve disorder   . Type 2 diabetes mellitus ([place])    Type II    Past Surgical History:  Procedure Laterality Date  . A-V CARDIAC PACEMAKER INSERTION     Sick sinus syndrome DDR pacer  . Arthropathy Right 2005   Rebuilding of left thumb and joint   . CARDIAC CATHETERIZATION    . CARDIAC ELECTROPHYSIOLOGY  STUDY AND ABLATION  09/2008   for pvcs, Dr. Loralie Champagne  . CARDIOVERSION N/A 12/18/2016   Procedure: CARDIOVERSION;  Surgeon: Pixie Casino, MD;  Location: [facility] ENDOSCOPY;  Service: Cardiovascular;  Laterality: N/A;  . [place]   right wrist  . CARPAL TUNNEL RELEASE  05/04/2012   Procedure: CARPAL TUNNEL RELEASE;  Surgeon: Wynonia Sours, MD;  Location: [place];  Service: Orthopedics;  Laterality: Left;  . CARPOMETACARPEL SUSPENSION PLASTY Right 11/16/2014   Procedure: SUSPENSIONPLASTY RIGHT THUMB TENDON TRANSFER ABDUCTOR POLLICUS LONGUS EXCISION TRAPEZIUM;  Surgeon: Daryll Brod, MD;  Location: [place];  Service: Orthopedics;  Laterality: Right;  . CHOLECYSTECTOMY  1994  . CIRCUMCISION    . COLONOSCOPY N/A 03/14/2013   Procedure: COLONOSCOPY;  Surgeon: Daneil Dolin, MD;  [facility];  Service: Endoscopy;  Laterality: N/A;  8:15 AM  . EYE SURGERY     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  . EYE SURGERY  2012   Left eye Corneal transplant- partial- Cataract  . GALLBLADDER SURGERY  12/01/2006  . HAND TENDON SURGERY Left late 1990's   thumb  . HEMORROIDECTOMY  2003  . INJECTION KNEE Left 08/26/2017   Procedure: LEFT KNEE INJECTION;  Surgeon: Elsie Saas, MD;  Location: [place];  Service: Orthopedics;  Laterality: Left;  . left Knee Arthroscopy     April 21 2011- Day Surgery center  . PARTIAL KNEE ARTHROPLASTY  11/22/2012   Procedure: UNICOMPARTMENTAL KNEE;  Surgeon: Lorn Junes, MD;  Location: [place];  Service: Orthopedics;  Laterality: Left;  left unicompartmental knee arthroplasty  . PERMANENT PACEMAKER GENERATOR CHANGE N/A 01/12/2013   Procedure: PERMANENT PACEMAKER GENERATOR CHANGE;  Surgeon: Evans Lance, MD;  Location: [facility] CATH LAB;  Service: Cardiovascular;  Laterality: N/A;  . Rotator cuff Surgery  2001   Right shoulder  . TONSILLECTOMY    . TOTAL KNEE ARTHROPLASTY Right 08/26/2017   Procedure: TOTAL KNEE  ARTHROPLASTY;  Surgeon: Elsie Saas, MD;  Location: [place];  Service: Orthopedics;  Laterality: Right;    There were no vitals filed for this visit.      Subjective Assessment - 10/02/17 1126    Subjective Pt stated he feels like he is making good progress, reports he has reduced pain medication and improved sleep last night.  Arrived ambulating with no AD.   Pertinent History HTN, hx PVCs, R BBB, heart valve disorders, OSA, DM, Pacemaker (recent disfunction but MD has repaired and is monitoring), L partial knee, R knee replacement 08/26/17)   Patient Stated Goals be able to go on vacation and walk the beach    Currently in Pain? No/denies  pain medication prior session                         [place] Adult PT Treatment/Exercise - 10/02/17 0001      Knee/Hip Exercises: Stretches   Active Hamstring Stretch 3 reps;30 seconds   Active Hamstring Stretch Limitations 12 inch box    Knee: Self-Stretch to increase Flexion Right;10 seconds   Knee: Self-Stretch Limitations x10      Knee/Hip Exercises: Standing   Stairs 5 RT reciprocal 1 HR   SLS 5x max Rt 7", LT 12"   SLS with Vectors 3x 5" holds Rt SLS   Other Standing Knee Exercises tandem stance 3x 30"   Other Standing Knee Exercises baalnce beam tandem and sidestepping 2RT each     Knee/Hip Exercises: Supine   Heel Slides 10 reps   Heel Slides Limitations 5" holds    Knee Extension Limitations 3   Knee Flexion Limitations 116     Knee/Hip Exercises: Prone   Hamstring Curl 15 reps   Contract/Relax to Increase Flexion 5     Manual Therapy   Manual Therapy Edema management;Passive ROM   Manual therapy comments performed separate from all other servcies    Edema Management LE elevated, retrograde massage    Soft tissue mobilization STM to quad and medial hamstrings   Passive ROM contract/relax for flexion in prone 5 reps                  PT Short Term Goals - 09/30/17 1139      PT SHORT TERM GOAL #1    Title Patient to demonstrate R knee ROM as being 0-115 in order to improve gait mechanics and overall mobility    Baseline 10/3- 5-107   Time 3   Period Weeks   Status On-going     PT SHORT TERM GOAL #2   Title Patient to be consistently ambulating with SPC, consistent heel-toe mechanics and minimal unsteadiness, in order to show improved balance and general mobility    Baseline 10/3- looking great    Time 3  Period Weeks   Status Achieved     PT SHORT TERM GOAL #3   Title Patient to be able to complete sit to stand with no UEs and no weight shift to L LE in order to show improved proprioception and strength in surgical limb    Baseline 10/3-  continues to have offshift    Time 3   Period Weeks   Status On-going     PT SHORT TERM GOAL #4   Title Patient to correctly and consistently perform appropriate HEP, to be updated PRN    Baseline 10/3- compliant    Time 1   Period Weeks   Status Achieved           PT Long Term Goals - 09/30/17 1142      PT LONG TERM GOAL #1   Title Patient to demonstrate strength 5/5 in all tested muscle groups in order to assist in maintaining correct functional posture and reducing pain    Baseline 10/3- virtually met    Time 6   Period Weeks   Status Achieved     PT LONG TERM GOAL #2   Title Patient to be able to ambulate over level surfaces with no device and unsteady/uneven surfaces with cane, correct heel-toe pattern and minimal unsteadiness, in order to improve mobiilty and improved home/community access    Baseline 10/3- continues to use AD outside    Time 6   Period Weeks   Status On-going     PT LONG TERM GOAL #3   Title Patient to be able to complete TUG in 12 seconds or less with no device and maintain SLS for at least 15 seconds on level surface in order to demonstrate improved functional balance    Baseline 10/3- 8.75    Time 6   Period Weeks   Status Achieved     PT LONG TERM GOAL #4   Title Patient to reciprocally  ascend/descend stairs with U railing, minimal unsteadiness, good eccentric control in order to improve home/community access    Baseline 10/3- functional performance, mild difficulty    Time 6   Period Weeks   Status Partially Met               Plan - 10/02/17 1212    Clinical Impression Statement Continued session focus with knee mobility then progressed to functional strengthening and balance training.  Pt contniues to have some edema present proximal knee, decreased overall compared to even last week.  Manual retro massage and soft tissue mobilization complete for edema control and to reduce overall tightness.  Pt ROM progressing well with improved 3-116 degrees following manual.  Pt presents with increased comfort ascending steps, noted weakness with eccentric control though did improve with cueing for control.  Pt does continue to present with decreased abilities wiht SLS though reports compliance with balance HEP daily.  No reports of increased pain throgh session.     Rehab Potential Excellent   Clinical Impairments Affecting Rehab Potential (+) motivated to participate, success with PT in the past   PT Frequency 3x / week   PT Duration 3 weeks   PT Treatment/Interventions ADLs/Self Care Home Management;Cryotherapy;Moist Heat;DME Instruction;Gait training;Stair training;Functional mobility training;Therapeutic activities;Therapeutic exercise;Balance training;Neuromuscular re-education;Patient/family education;Manual techniques;Scar mobilization;Passive range of motion;Dry needling;Taping   PT Next Visit Plan continue ROM work, Hotel manager functional strengthening and neuro-re ed, continue balance.    PT Home Exercise Plan Eval: continuew with HHPT HEP, increase focus on quad sets, heel slides, knee  flexion and extension stretches       Patient will benefit from skilled therapeutic intervention in order to improve the following deficits and impairments:  Abnormal gait, Decreased skin  integrity, Pain, Improper body mechanics, Decreased coordination, Decreased mobility, Decreased scar mobility, Increased muscle spasms, Postural dysfunction, Decreased activity tolerance, Decreased range of motion, Decreased strength, Hypomobility, Decreased balance, Decreased safety awareness, Difficulty walking, Impaired flexibility, Increased edema  Visit Diagnosis: Stiffness of right knee, not elsewhere classified  Localized edema  Difficulty in walking, not elsewhere classified  Unsteadiness on feet     Problem List Patient Active Problem List   Diagnosis Date Noted  . Primary localized osteoarthritis of right knee 08/11/2017  . Asthma with acute exacerbation 12/08/2016  . Chest pain 10/20/2016  . HTN (hypertension) 10/20/2016  . Morbid obesity due to excess calories ([place]) 06/24/2016  . Cough variant asthma 02/12/2016  . Upper airway cough syndrome 01/24/2016  . 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 Essential hypertension   . Persistent atrial fibrillation ([place])   . Syncope   . Hyperthyroidism   . Hyperlipidemia   . RBBB (right bundle branch block)   . Tricuspid valve disorder   . Aortic valve disorder   . Carotid artery stenosis   . Diabetes ([place])   . Pacemaker   . OSA (obstructive sleep apnea)   . Arthritis   . Sinoatrial node dysfunction (HCC)   . PVC's (premature ventricular contractions) 07/17/2011  . Essential hypertension, benign 01/24/2011  . Premature ventricular contractions 01/24/2011  . PPM-St.Jude 01/24/2011   Ihor Austin, [place]; [place]  Aldona Lento 10/02/2017, 12:33 PM  [place] [address] [place], [place], [address] Phone: [phone]   Fax:  [phone]  Name: Clarence Dawson MRN: [id number] Date of Birth: 1946/11/30

## 2017-10-05 ENCOUNTER — Encounter (HOSPITAL_COMMUNITY): Payer: Self-pay | Admitting: Physical Therapy

## 2017-10-05 ENCOUNTER — Ambulatory Visit (HOSPITAL_COMMUNITY): Payer: Medicare Other | Admitting: Physical Therapy

## 2017-10-05 DIAGNOSIS — R262 Difficulty in walking, not elsewhere classified: Secondary | ICD-10-CM | POA: Diagnosis not present

## 2017-10-05 DIAGNOSIS — M6281 Muscle weakness (generalized): Secondary | ICD-10-CM | POA: Diagnosis not present

## 2017-10-05 DIAGNOSIS — M25661 Stiffness of right knee, not elsewhere classified: Secondary | ICD-10-CM

## 2017-10-05 DIAGNOSIS — R2681 Unsteadiness on feet: Secondary | ICD-10-CM | POA: Diagnosis not present

## 2017-10-05 DIAGNOSIS — R600 Localized edema: Secondary | ICD-10-CM | POA: Diagnosis not present

## 2017-10-05 NOTE — Therapy (Signed)
[place] [place], [place], [address] Phone: [phone]   Fax:  [phone]  Physical Therapy Treatment  Patient Details  Name: Clarence Dawson MRN: [id number] Date of Birth: June 01, 1946 Referring Provider: Elsie Saas  Encounter Date: 10/05/2017      PT End of Session - 10/05/17 1157    Visit Number 12   Number of Visits 19   Date for PT Re-Evaluation 10/16/17   Authorization Type Medicare/[employer] Indemnity (G-codes done 10th session)   Authorization Time Period 09/07/17 to 10/19/17   Authorization - Visit Number 12   Authorization - Number of Visits 20   PT Start Time 1115   PT Stop Time 1156   PT Time Calculation (min) 41 min   Activity Tolerance Patient tolerated treatment well   Behavior During Therapy [facility] for tasks assessed/performed      Past Medical History:  Diagnosis Date  . Allergic rhinitis   . Aortic valve disorder   . Asthma    since childhood- seasonal allergies induced  . Cancer ([place])    Skin cancer- squamous, basal  . Carotid artery stenosis   . Essential hypertension   . Full dentures   . GERD (gastroesophageal reflux disease)   . H/O hiatal hernia   . Hemorrhage of rectum   . Hyperlipidemia   . Hypothyroidism   . 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 OSA (obstructive sleep apnea)   . Osteoarthritis   . Pacemaker    Oct 2005 in [place].  Marland Kitchen PAF (paroxysmal atrial fibrillation) ([place])   . Pneumonia    "several Times" 2015 last time  . Primary localized osteoarthritis of right knee 08/11/2017  . RBBB (right bundle branch block)   . Sinoatrial node dysfunction (HCC)   . Syncope   . Tricuspid valve disorder   . Type 2 diabetes mellitus ([place])    Type II    Past Surgical History:  Procedure Laterality Date  . A-V CARDIAC PACEMAKER INSERTION     Sick sinus syndrome DDR pacer  . Arthropathy Right 2005   Rebuilding of left thumb and joint   . CARDIAC CATHETERIZATION    . CARDIAC ELECTROPHYSIOLOGY  STUDY AND ABLATION  09/2008   for pvcs, Dr. Loralie Champagne  . CARDIOVERSION N/A 12/18/2016   Procedure: CARDIOVERSION;  Surgeon: Pixie Casino, MD;  Location: [facility] ENDOSCOPY;  Service: Cardiovascular;  Laterality: N/A;  . [place]   right wrist  . CARPAL TUNNEL RELEASE  05/04/2012   Procedure: CARPAL TUNNEL RELEASE;  Surgeon: Wynonia Sours, MD;  Location: [place];  Service: Orthopedics;  Laterality: Left;  . CARPOMETACARPEL SUSPENSION PLASTY Right 11/16/2014   Procedure: SUSPENSIONPLASTY RIGHT THUMB TENDON TRANSFER ABDUCTOR POLLICUS LONGUS EXCISION TRAPEZIUM;  Surgeon: Daryll Brod, MD;  Location: [place];  Service: Orthopedics;  Laterality: Right;  . CHOLECYSTECTOMY  1994  . CIRCUMCISION    . COLONOSCOPY N/A 03/14/2013   Procedure: COLONOSCOPY;  Surgeon: Daneil Dolin, MD;  [facility];  Service: Endoscopy;  Laterality: N/A;  8:15 AM  . EYE SURGERY     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  . EYE SURGERY  2012   Left eye Corneal transplant- partial- Cataract  . GALLBLADDER SURGERY  12/01/2006  . HAND TENDON SURGERY Left late 1990's   thumb  . HEMORROIDECTOMY  2003  . INJECTION KNEE Left 08/26/2017   Procedure: LEFT KNEE INJECTION;  Surgeon: Elsie Saas, MD;  Location: [place];  Service: Orthopedics;  Laterality: Left;  . left Knee Arthroscopy     [facility]  . PARTIAL KNEE ARTHROPLASTY  11/22/2012   Procedure: UNICOMPARTMENTAL KNEE;  Surgeon: Lorn Junes, MD;  Location: [place];  Service: Orthopedics;  Laterality: Left;  left unicompartmental knee arthroplasty  . PERMANENT PACEMAKER GENERATOR CHANGE N/A 01/12/2013   Procedure: PERMANENT PACEMAKER GENERATOR CHANGE;  Surgeon: Evans Lance, MD;  Location[facility] CATH LAB;  Service: Cardiovascular;  Laterality: N/A;  . Rotator cuff Surgery  2001   Right shoulder  . TONSILLECTOMY    . TOTAL KNEE ARTHROPLASTY Right 08/26/2017   Procedure: TOTAL KNEE  ARTHROPLASTY;  Surgeon: Elsie Saas, MD;  Location: [place];  Service: Orthopedics;  Laterality: Right;    There were no vitals filed for this visit.      Subjective Assessment - 10/05/17 1117    Subjective Patient arrvies stating he is wondering if he is having more trouble sleeping on nights before PT, he is giong to document this. He continues to do well overall.    Pertinent History HTN, hx PVCs, R BBB, heart valve disorders, OSA, DM, Pacemaker (recent disfunction but MD has repaired and is monitoring), L partial knee, R knee replacement 08/26/17)   Patient Stated Goals be able to go on vacation and walk the beach    Currently in Pain? Yes   Pain Score 3    Pain Location Knee   Pain Orientation Right   Pain Descriptors / Indicators Tightness;Sore;Aching;Throbbing   Pain Type Chronic pain   Pain Radiating Towards none    Pain Onset More than a month ago   Pain Frequency Constant   Aggravating Factors  activity    Pain Relieving Factors ice    Effect of Pain on Daily Activities moderate             [facility] PT Assessment - 10/05/17 0001      AROM   Right Knee Extension 1   Right Knee Flexion 118                     [facility] Adult PT Treatment/Exercise - 10/05/17 0001      Knee/Hip Exercises: Stretches   Active Hamstring Stretch 3 reps;30 seconds   Active Hamstring Stretch Limitations 12 inch box    Quad Stretch Right;3 reps;30 seconds   Quad Stretch Limitations prone    Knee: Self-Stretch to increase Flexion Right;10 seconds   Knee: Self-Stretch Limitations x10      Knee/Hip Exercises: Standing   Heel Raises Both;15 reps   Heel Raises Limitations heel and toe, intermittent HHA    Forward Lunges 1 set;10 reps;Right   Forward Lunges Limitations 4 inch box    Lateral Step Up Right;10 reps   Lateral Step Up Limitations 8 inch box    Forward Step Up Right;1 set;10 reps;Step Height: 4";2 sets  8 inch box 2nd set    Forward Step Up Limitations 4 inch box then  8 inch box    Step Down Right;1 set;15 reps   Step Down Limitations 4 inch box    Functional Squat 15 reps   Functional Squat Limitations cues for form      Manual Therapy   Manual Therapy Edema management;Soft tissue mobilization;Passive ROM   Manual therapy comments performed separate from all other servcies    Edema Management LE elevated, retrograde massage    Soft tissue mobilization STM to quad and medial hamstrings  PT Education - 10/05/17 1157    Education provided No          PT Short Term Goals - 09/30/17 1139      PT SHORT TERM GOAL #1   Title Patient to demonstrate R knee ROM as being 0-115 in order to improve gait mechanics and overall mobility    Baseline 10/3- 5-107   Time 3   Period Weeks   Status On-going     PT SHORT TERM GOAL #2   Title Patient to be consistently ambulating with SPC, consistent heel-toe mechanics and minimal unsteadiness, in order to show improved balance and general mobility    Baseline 10/3- looking great    Time 3   Period Weeks   Status Achieved     PT SHORT TERM GOAL #3   Title Patient to be able to complete sit to stand with no UEs and no weight shift to L LE in order to show improved proprioception and strength in surgical limb    Baseline 10/3-  continues to have offshift    Time 3   Period Weeks   Status On-going     PT SHORT TERM GOAL #4   Title Patient to correctly and consistently perform appropriate HEP, to be updated PRN    Baseline 10/3- compliant    Time 1   Period Weeks   Status Achieved           PT Long Term Goals - 09/30/17 1142      PT LONG TERM GOAL #1   Title Patient to demonstrate strength 5/5 in all tested muscle groups in order to assist in maintaining correct functional posture and reducing pain    Baseline 10/3- virtually met    Time 6   Period Weeks   Status Achieved     PT LONG TERM GOAL #2   Title Patient to be able to ambulate over level surfaces with no device  and unsteady/uneven surfaces with cane, correct heel-toe pattern and minimal unsteadiness, in order to improve mobiilty and improved home/community access    Baseline 10/3- continues to use AD outside    Time 6   Period Weeks   Status On-going     PT LONG TERM GOAL #3   Title Patient to be able to complete TUG in 12 seconds or less with no device and maintain SLS for at least 15 seconds on level surface in order to demonstrate improved functional balance    Baseline 10/3- 8.75    Time 6   Period Weeks   Status Achieved     PT LONG TERM GOAL #4   Title Patient to reciprocally ascend/descend stairs with U railing, minimal unsteadiness, good eccentric control in order to improve home/community access    Baseline 10/3- functional performance, mild difficulty    Time 6   Period Weeks   Status Partially Met               Plan - 10/05/17 1157    Clinical Impression Statement Patient arrives today reporting he is doing well but continues to have issues sleeping at night due to knee discomfort but no other issues overall. Continued working on ROM based activities, noting ROM of 1-119 today and therefore progressing to more functional strengthening based tasks today. Patient continues to do well overall moving forward; plan to drop to 2x/week if this level of ROM is maintained next session.    Rehab Potential Excellent   Clinical Impairments Affecting Rehab  Potential (+) motivated to participate, success with PT in the past   PT Frequency 3x / week   PT Duration 3 weeks   PT Treatment/Interventions ADLs/Self Care Home Management;Cryotherapy;Moist Heat;DME Instruction;Gait training;Stair training;Functional mobility training;Therapeutic activities;Therapeutic exercise;Balance training;Neuromuscular re-education;Patient/family education;Manual techniques;Scar mobilization;Passive range of motion;Dry needling;Taping   PT Next Visit Plan monitor ROM; if it remains at this level, drop to  2x/week. Fucntional strength and balance. Stairs.    PT Home Exercise Plan Eval: continuew with HHPT HEP, increase focus on quad sets, heel slides, knee flexion and extension stretches    Consulted and Agree with Plan of Care Patient      Patient will benefit from skilled therapeutic intervention in order to improve the following deficits and impairments:  Abnormal gait, Decreased skin integrity, Pain, Improper body mechanics, Decreased coordination, Decreased mobility, Decreased scar mobility, Increased muscle spasms, Postural dysfunction, Decreased activity tolerance, Decreased range of motion, Decreased strength, Hypomobility, Decreased balance, Decreased safety awareness, Difficulty walking, Impaired flexibility, Increased edema  Visit Diagnosis: Stiffness of right knee, not elsewhere classified  Localized edema  Difficulty in walking, not elsewhere classified  Unsteadiness on feet     Problem List Patient Active Problem List   Diagnosis Date Noted  . Primary localized osteoarthritis of right knee 08/11/2017  . Asthma with acute exacerbation 12/08/2016  . Chest pain 10/20/2016  . HTN (hypertension) 10/20/2016  . Morbid obesity due to excess calories ([place]) 06/24/2016  . Cough variant asthma 02/12/2016  . Upper airway cough syndrome 01/24/2016  . 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 Essential hypertension   . Persistent atrial fibrillation ([place])   . Syncope   . Hyperthyroidism   . Hyperlipidemia   . RBBB (right bundle branch block)   . Tricuspid valve disorder   . Aortic valve disorder   . Carotid artery stenosis   . Diabetes ([place])   . Pacemaker   . OSA (obstructive sleep apnea)   . Arthritis   . Sinoatrial node dysfunction (HCC)   . PVC's (premature ventricular contractions) 07/17/2011  . Essential hypertension, benign 01/24/2011  . Premature ventricular contractions 01/24/2011  . PPM-St.Jude 01/24/2011    Deniece Ree PT, DPT [phone]  [facility] [address] [place], [place], [address] Phone: [phone]   Fax:  [phone]  Name: Clarence Dawson MRN: [id number] Date of Birth: 1946/07/26

## 2017-10-07 ENCOUNTER — Encounter (HOSPITAL_COMMUNITY): Payer: Self-pay | Admitting: Physical Therapy

## 2017-10-07 ENCOUNTER — Ambulatory Visit (HOSPITAL_COMMUNITY): Payer: Medicare Other | Admitting: Physical Therapy

## 2017-10-07 DIAGNOSIS — M25661 Stiffness of right knee, not elsewhere classified: Secondary | ICD-10-CM

## 2017-10-07 DIAGNOSIS — R600 Localized edema: Secondary | ICD-10-CM

## 2017-10-07 DIAGNOSIS — R262 Difficulty in walking, not elsewhere classified: Secondary | ICD-10-CM

## 2017-10-07 DIAGNOSIS — M6281 Muscle weakness (generalized): Secondary | ICD-10-CM | POA: Diagnosis not present

## 2017-10-07 DIAGNOSIS — R2681 Unsteadiness on feet: Secondary | ICD-10-CM

## 2017-10-07 NOTE — Therapy (Signed)
[place], [place], [address] Phone: [phone]   Fax:  [phone]  Physical Therapy Treatment  Patient Details  Name: Clarence Dawson MRN: [id number] Date of Birth: 1946-07-22 Referring Provider: Elsie Saas  Encounter Date: 10/07/2017      PT End of Session - 10/07/17 1204    Visit Number 13   Number of Visits 19   Date for PT Re-Evaluation 10/16/17   Authorization Type Medicare/[employer] Indemnity (G-codes done 10th session)   Authorization Time Period 09/07/17 to 10/19/17   Authorization - Visit Number 13   Authorization - Number of Visits 20   PT Start Time 1120   PT Stop Time 1158   PT Time Calculation (min) 38 min   Activity Tolerance Patient tolerated treatment well   Behavior During Therapy [facility] for tasks assessed/performed      Past Medical History:  Diagnosis Date  . Allergic rhinitis   . Aortic valve disorder   . Asthma    since childhood- seasonal allergies induced  . Cancer ([place])    Skin cancer- squamous, basal  . Carotid artery stenosis   . Essential hypertension   . Full dentures   . GERD (gastroesophageal reflux disease)   . H/O hiatal hernia   . Hemorrhage of rectum   . Hyperlipidemia   . Hypothyroidism   . 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 OSA (obstructive sleep apnea)   . Osteoarthritis   . Pacemaker    Oct 2005 in [place].  Marland Kitchen PAF (paroxysmal atrial fibrillation) ([place])   . Pneumonia    "several Times" 2015 last time  . Primary localized osteoarthritis of right knee 08/11/2017  . RBBB (right bundle branch block)   . Sinoatrial node dysfunction (HCC)   . Syncope   . Tricuspid valve disorder   . Type 2 diabetes mellitus ([place])    Type II    Past Surgical History:  Procedure Laterality Date  . A-V CARDIAC PACEMAKER INSERTION     Sick sinus syndrome DDR pacer  . Arthropathy Right 2005   Rebuilding of left thumb and joint   . CARDIAC CATHETERIZATION    . CARDIAC ELECTROPHYSIOLOGY  STUDY AND ABLATION  09/2008   for pvcs, Dr. Loralie Champagne  . CARDIOVERSION N/A 12/18/2016   Procedure: CARDIOVERSION;  Surgeon: Pixie Casino, MD;  Location: [facility] ENDOSCOPY;  Service: Cardiovascular;  Laterality: N/A;  . [place]   right wrist  . CARPAL TUNNEL RELEASE  05/04/2012   Procedure: CARPAL TUNNEL RELEASE;  Surgeon: Wynonia Sours, MD;  Location: [place];  Service: Orthopedics;  Laterality: Left;  . CARPOMETACARPEL SUSPENSION PLASTY Right 11/16/2014   Procedure: SUSPENSIONPLASTY RIGHT THUMB TENDON TRANSFER ABDUCTOR POLLICUS LONGUS EXCISION TRAPEZIUM;  Surgeon: Daryll Brod, MD;  Location: [place];  Service: Orthopedics;  Laterality: Right;  . CHOLECYSTECTOMY  1994  . CIRCUMCISION    . COLONOSCOPY N/A 03/14/2013   Procedure: COLONOSCOPY;  Surgeon: Daneil Dolin, MD;  [facility];  Service: Endoscopy;  Laterality: N/A;  8:15 AM  . EYE SURGERY     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  . EYE SURGERY  2012   Left eye Corneal transplant- partial- Cataract  . GALLBLADDER SURGERY  12/01/2006  . HAND TENDON SURGERY Left late 1990's   thumb  . HEMORROIDECTOMY  2003  . INJECTION KNEE Left 08/26/2017   Procedure: LEFT KNEE INJECTION;  Surgeon: Elsie Saas, MD;  Location: [place];  Service: Orthopedics;  Laterality: Left;  . left Knee Arthroscopy     [facility] 21 2011- [facility]  . PARTIAL KNEE ARTHROPLASTY  11/22/2012   Procedure: UNICOMPARTMENTAL KNEE;  Surgeon: Lorn Junes, MD;  Location: [place];  Service: Orthopedics;  Laterality: Left;  left unicompartmental knee arthroplasty  . PERMANENT PACEMAKER GENERATOR CHANGE N/A 01/12/2013   Procedure: PERMANENT PACEMAKER GENERATOR CHANGE;  Surgeon: Evans Lance, MD;  Location: [facility] CATH LAB;  Service: Cardiovascular;  Laterality: N/A;  . Rotator cuff Surgery  2001   Right shoulder  . TONSILLECTOMY    . TOTAL KNEE ARTHROPLASTY Right 08/26/2017   Procedure: TOTAL KNEE  ARTHROPLASTY;  Surgeon: Elsie Saas, MD;  Location: [place];  Service: Orthopedics;  Laterality: Right;    There were no vitals filed for this visit.      Subjective Assessment - 10/07/17 1120    Subjective Patient arrives statign he was very sore after his workout on Monday, it is better today though. Nothing else going on.    Pertinent History HTN, hx PVCs, R BBB, heart valve disorders, OSA, DM, Pacemaker (recent disfunction but MD has repaired and is monitoring), L partial knee, R knee replacement 08/26/17)   Patient Stated Goals be able to go on vacation and walk the beach    Currently in Pain? Yes   Pain Score 2    Pain Location Knee   Pain Orientation Right;Left   Pain Descriptors / Indicators Sore   Pain Type Acute pain                         [facility] Adult PT Treatment/Exercise - 10/07/17 0001      Knee/Hip Exercises: Stretches   Active Hamstring Stretch 3 reps;30 seconds;Both   Active Hamstring Stretch Limitations 12 inch box    Quad Stretch Both;3 reps;30 seconds   Quad Stretch Limitations prone    Knee: Self-Stretch to increase Flexion Right;10 seconds   Knee: Self-Stretch Limitations x10    Gastroc Stretch Both;3 reps;30 seconds   Gastroc Stretch Limitations slantboard      Knee/Hip Exercises: Standing   Heel Raises Both;15 reps   Heel Raises Limitations heel and toe, intermittent HHA    Forward Lunges Both;1 set;10 reps   Forward Lunges Limitations 4 inch box    Lateral Step Up Both;1 set;10 reps   Lateral Step Up Limitations 8 inch box    Forward Step Up 1 set;10 reps;Both   Forward Step Up Limitations 8 inch box    Step Down Both;1 set;15 reps   Step Down Limitations 4 inch box    Functional Squat 15 reps   Functional Squat Limitations cues for form              Balance Exercises - 10/07/17 1152      Balance Exercises: Standing   Tandem Stance Eyes open;Foam/compliant surface;3 reps;20 secs   Tandem Gait Forward;3 reps;Other  (comment)  in // bars            PT Education - 10/07/17 1203    Education provided No          PT Short Term Goals - 09/30/17 1139      PT SHORT TERM GOAL #1   Title Patient to demonstrate R knee ROM as being 0-115 in order to improve gait mechanics and overall mobility    Baseline 10/3- 5-107   Time 3   Period Weeks   Status On-going  PT SHORT TERM GOAL #2   Title Patient to be consistently ambulating with SPC, consistent heel-toe mechanics and minimal unsteadiness, in order to show improved balance and general mobility    Baseline 10/3- looking great    Time 3   Period Weeks   Status Achieved     PT SHORT TERM GOAL #3   Title Patient to be able to complete sit to stand with no UEs and no weight shift to L LE in order to show improved proprioception and strength in surgical limb    Baseline 10/3-  continues to have offshift    Time 3   Period Weeks   Status On-going     PT SHORT TERM GOAL #4   Title Patient to correctly and consistently perform appropriate HEP, to be updated PRN    Baseline 10/3- compliant    Time 1   Period Weeks   Status Achieved           PT Long Term Goals - 09/30/17 1142      PT LONG TERM GOAL #1   Title Patient to demonstrate strength 5/5 in all tested muscle groups in order to assist in maintaining correct functional posture and reducing pain    Baseline 10/3- virtually met    Time 6   Period Weeks   Status Achieved     PT LONG TERM GOAL #2   Title Patient to be able to ambulate over level surfaces with no device and unsteady/uneven surfaces with cane, correct heel-toe pattern and minimal unsteadiness, in order to improve mobiilty and improved home/community access    Baseline 10/3- continues to use AD outside    Time 6   Period Weeks   Status On-going     PT LONG TERM GOAL #3   Title Patient to be able to complete TUG in 12 seconds or less with no device and maintain SLS for at least 15 seconds on level surface in order  to demonstrate improved functional balance    Baseline 10/3- 8.75    Time 6   Period Weeks   Status Achieved     PT LONG TERM GOAL #4   Title Patient to reciprocally ascend/descend stairs with U railing, minimal unsteadiness, good eccentric control in order to improve home/community access    Baseline 10/3- functional performance, mild difficulty    Time 6   Period Weeks   Status Partially Met               Plan - 10/07/17 1204    Clinical Impression Statement Patient arrives today reporting he was very sore after last session, "it feels like I got a good workout"; continued with functional strengthening and some ROM based exercises as well, also progressing balance exercises. Patient doing well overall but continues to demonstrate functional weakness and unsteadiness moving forward.    Rehab Potential Excellent   Clinical Impairments Affecting Rehab Potential (+) motivated to participate, success with PT in the past   PT Frequency 3x / week   PT Duration 3 weeks   PT Treatment/Interventions ADLs/Self Care Home Management;Cryotherapy;Moist Heat;DME Instruction;Gait training;Stair training;Functional mobility training;Therapeutic activities;Therapeutic exercise;Balance training;Neuromuscular re-education;Patient/family education;Manual techniques;Scar mobilization;Passive range of motion;Dry needling;Taping   PT Next Visit Plan monitor ROM but may focus on functional strength and balance. Continue with appts as scheduled. Stairs.    PT Home Exercise Plan Eval: continuew with HHPT HEP, increase focus on quad sets, heel slides, knee flexion and extension stretches    Consulted and  Agree with Plan of Care Patient      Patient will benefit from skilled therapeutic intervention in order to improve the following deficits and impairments:  Abnormal gait, Decreased skin integrity, Pain, Improper body mechanics, Decreased coordination, Decreased mobility, Decreased scar mobility, Increased  muscle spasms, Postural dysfunction, Decreased activity tolerance, Decreased range of motion, Decreased strength, Hypomobility, Decreased balance, Decreased safety awareness, Difficulty walking, Impaired flexibility, Increased edema  Visit Diagnosis: Stiffness of right knee, not elsewhere classified  Localized edema  Difficulty in walking, not elsewhere classified  Unsteadiness on feet     Problem List Patient Active Problem List   Diagnosis Date Noted  . Primary localized osteoarthritis of right knee 08/11/2017  . Asthma with acute exacerbation 12/08/2016  . Chest pain 10/20/2016  . HTN (hypertension) 10/20/2016  . Morbid obesity due to excess calories ([place]) 06/24/2016  . Cough variant asthma 02/12/2016  . Upper airway cough syndrome 01/24/2016  . 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 Essential hypertension   . Persistent atrial fibrillation ([place])   . Syncope   . Hyperthyroidism   . Hyperlipidemia   . RBBB (right bundle branch block)   . Tricuspid valve disorder   . Aortic valve disorder   . Carotid artery stenosis   . Diabetes ([place])   . Pacemaker   . OSA (obstructive sleep apnea)   . Arthritis   . Sinoatrial node dysfunction (HCC)   . PVC's (premature ventricular contractions) 07/17/2011  . Essential hypertension, benign 01/24/2011  . Premature ventricular contractions 01/24/2011  . PPM-St.Jude 01/24/2011    Deniece Ree PT, DPT [phone]  [place] [facility] [address] [place], [place], [address] Phone: [phone]   Fax:  [phone]  Name: Clarence Dawson MRN: [id number] Date of Birth: [birth year]

## 2017-10-09 ENCOUNTER — Telehealth (HOSPITAL_COMMUNITY): Payer: Self-pay

## 2017-10-09 ENCOUNTER — Ambulatory Visit (HOSPITAL_COMMUNITY): Payer: Medicare Other

## 2017-10-09 DIAGNOSIS — R2681 Unsteadiness on feet: Secondary | ICD-10-CM | POA: Diagnosis not present

## 2017-10-09 DIAGNOSIS — M6281 Muscle weakness (generalized): Secondary | ICD-10-CM

## 2017-10-09 DIAGNOSIS — R262 Difficulty in walking, not elsewhere classified: Secondary | ICD-10-CM | POA: Diagnosis not present

## 2017-10-09 DIAGNOSIS — M25661 Stiffness of right knee, not elsewhere classified: Secondary | ICD-10-CM | POA: Diagnosis not present

## 2017-10-09 DIAGNOSIS — R600 Localized edema: Secondary | ICD-10-CM

## 2017-10-09 NOTE — Telephone Encounter (Signed)
Called to inform patient that clinic is open today and to confirm visit. Patient unable to come earlier, but will make scheduled appointment.   [place] PT, DPT 9:08 AM, 10/09/17 [phone]

## 2017-10-09 NOTE — Therapy (Signed)
[place], [place], [address] Phone: [phone]   Fax:  [phone]  Physical Therapy Treatment  Patient Details  Name: Clarence Dawson MRN: [id number] Date of Birth: [birth year] Referring Provider: Elsie Saas  Encounter Date: 10/09/2017      PT End of Session - 10/09/17 1205    Visit Number 14   Number of Visits 19   Date for PT Re-Evaluation 10/16/17   Authorization Type Medicare/[employer] Indemnity (G-codes done 10th session)   Authorization Time Period 09/07/17 to 10/19/17   Authorization - Visit Number 14   Authorization - Number of Visits 20   PT Start Time 3734   PT Stop Time 1202   PT Time Calculation (min) 44 min   Activity Tolerance Patient tolerated treatment well   Behavior During Therapy [facility] for tasks assessed/performed      Past Medical History:  Diagnosis Date  . Allergic rhinitis   . Aortic valve disorder   . Asthma    since childhood- seasonal allergies induced  . Cancer ([place])    Skin cancer- squamous, basal  . Carotid artery stenosis   . Essential hypertension   . Full dentures   . GERD (gastroesophageal reflux disease)   . H/O hiatal hernia   . Hemorrhage of rectum   . Hyperlipidemia   . Hypothyroidism   . 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 OSA (obstructive sleep apnea)   . Osteoarthritis   . Pacemaker    Oct 2005 in [place].  Marland Kitchen PAF (paroxysmal atrial fibrillation) ([place])   . Pneumonia    "several Times" 2015 last time  . Primary localized osteoarthritis of right knee 08/11/2017  . RBBB (right bundle branch block)   . Sinoatrial node dysfunction (HCC)   . Syncope   . Tricuspid valve disorder   . Type 2 diabetes mellitus ([place])    Type II    Past Surgical History:  Procedure Laterality Date  . A-V CARDIAC PACEMAKER INSERTION     Sick sinus syndrome DDR pacer  . Arthropathy Right 2005   Rebuilding of left thumb and joint   . CARDIAC CATHETERIZATION    . CARDIAC ELECTROPHYSIOLOGY  STUDY AND ABLATION  09/2008   for pvcs, Dr. Loralie Champagne  . CARDIOVERSION N/A 12/18/2016   Procedure: CARDIOVERSION;  Surgeon: Pixie Casino, MD;  Location: [facility] ENDOSCOPY;  Service: Cardiovascular;  Laterality: N/A;  . [place]   right wrist  . CARPAL TUNNEL RELEASE  05/04/2012   Procedure: CARPAL TUNNEL RELEASE;  Surgeon: Wynonia Sours, MD;  Location: [place];  Service: Orthopedics;  Laterality: Left;  . CARPOMETACARPEL SUSPENSION PLASTY Right 11/16/2014   Procedure: SUSPENSIONPLASTY RIGHT THUMB TENDON TRANSFER ABDUCTOR POLLICUS LONGUS EXCISION TRAPEZIUM;  Surgeon: Daryll Brod, MD;  Location: [place];  Service: Orthopedics;  Laterality: Right;  . CHOLECYSTECTOMY  1994  . CIRCUMCISION    . COLONOSCOPY N/A 03/14/2013   Procedure: COLONOSCOPY;  Surgeon: Daneil Dolin, MD;  [facility];  Service: Endoscopy;  Laterality: N/A;  8:15 AM  . EYE SURGERY     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  . EYE SURGERY  2012   Left eye Corneal transplant- partial- Cataract  . GALLBLADDER SURGERY  12/01/2006  . HAND TENDON SURGERY Left late 1990's   thumb  . HEMORROIDECTOMY  2003  . INJECTION KNEE Left 08/26/2017   Procedure: LEFT KNEE INJECTION;  Surgeon: Elsie Saas, MD;  Location: [place];  Service: Orthopedics;  Laterality: Left;  . left Knee Arthroscopy     [facility]  . PARTIAL KNEE ARTHROPLASTY  11/22/2012   Procedure: UNICOMPARTMENTAL KNEE;  Surgeon: Lorn Junes, MD;  Location: [place];  Service: Orthopedics;  Laterality: Left;  left unicompartmental knee arthroplasty  . PERMANENT PACEMAKER GENERATOR CHANGE N/A 01/12/2013   Procedure: PERMANENT PACEMAKER GENERATOR CHANGE;  Surgeon: Evans Lance, MD;  Location: [facility] CATH LAB;  Service: Cardiovascular;  Laterality: N/A;  . Rotator cuff Surgery  2001   Right shoulder  . TONSILLECTOMY    . TOTAL KNEE ARTHROPLASTY Right 08/26/2017   Procedure: TOTAL KNEE  ARTHROPLASTY;  Surgeon: Elsie Saas, MD;  Location: [place];  Service: Orthopedics;  Laterality: Right;    There were no vitals filed for this visit.      Subjective Assessment - 10/09/17 1120    Subjective Patient reports that his knee is feeling pretty good today, muscle soreness has improved. Nothing else knew to report. Patient notes that he ate later than usual and was light headed here and there but felt fine now.    Currently in Pain? No/denies                         [facility] Adult PT Treatment/Exercise - 10/09/17 0001      Knee/Hip Exercises: Stretches   Active Hamstring Stretch 3 reps;30 seconds;Both   Active Hamstring Stretch Limitations 12 inch box    Knee: Self-Stretch to increase Flexion Right;10 seconds   Knee: Self-Stretch Limitations x10    Gastroc Stretch Both;3 reps;30 seconds   Gastroc Stretch Limitations slantboard      Knee/Hip Exercises: Standing   Heel Raises 20 reps   Heel Raises Limitations Heel and toe separate no HHA   Forward Lunges Both;1 set;10 reps   Forward Lunges Limitations 6 inch box    Lateral Step Up Right;10 reps;2 sets   Lateral Step Up Limitations 8 inch box    Forward Step Up Both;10 reps;2 sets   Forward Step Up Limitations 8 inch box    Step Down Both;10 reps;2 sets   Step Down Limitations 6 inch box   Functional Squat 15 reps   Functional Squat Limitations cues for form    Rocker Board 2 minutes   Rocker Board Limitations side/side; front/back   Other Standing Knee Exercises tandem on 1/2 foam rolle both sides 2 sets x 2 reps each LE   Other Standing Knee Exercises baalnce beam tandem and sidestepping 2RT each     Knee/Hip Exercises: Seated   Sit to Sand 10 reps;without UE support;Other (comment)  2, 4# weight on gait belt                  PT Short Term Goals - 09/30/17 1139      PT SHORT TERM GOAL #1   Title Patient to demonstrate R knee ROM as being 0-115 in order to improve gait mechanics and  overall mobility    Baseline 10/3- 5-107   Time 3   Period Weeks   Status On-going     PT SHORT TERM GOAL #2   Title Patient to be consistently ambulating with SPC, consistent heel-toe mechanics and minimal unsteadiness, in order to show improved balance and general mobility    Baseline 10/3- looking great    Time 3   Period Weeks   Status Achieved     PT SHORT TERM GOAL #3  Title Patient to be able to complete sit to stand with no UEs and no weight shift to L LE in order to show improved proprioception and strength in surgical limb    Baseline 10/3-  continues to have offshift    Time 3   Period Weeks   Status On-going     PT SHORT TERM GOAL #4   Title Patient to correctly and consistently perform appropriate HEP, to be updated PRN    Baseline 10/3- compliant    Time 1   Period Weeks   Status Achieved           PT Long Term Goals - 09/30/17 1142      PT LONG TERM GOAL #1   Title Patient to demonstrate strength 5/5 in all tested muscle groups in order to assist in maintaining correct functional posture and reducing pain    Baseline 10/3- virtually met    Time 6   Period Weeks   Status Achieved     PT LONG TERM GOAL #2   Title Patient to be able to ambulate over level surfaces with no device and unsteady/uneven surfaces with cane, correct heel-toe pattern and minimal unsteadiness, in order to improve mobiilty and improved home/community access    Baseline 10/3- continues to use AD outside    Time 6   Period Weeks   Status On-going     PT LONG TERM GOAL #3   Title Patient to be able to complete TUG in 12 seconds or less with no device and maintain SLS for at least 15 seconds on level surface in order to demonstrate improved functional balance    Baseline 10/3- 8.75    Time 6   Period Weeks   Status Achieved     PT LONG TERM GOAL #4   Title Patient to reciprocally ascend/descend stairs with U railing, minimal unsteadiness, good eccentric control in order to  improve home/community access    Baseline 10/3- functional performance, mild difficulty    Time 6   Period Weeks   Status Partially Met               Plan - 10/09/17 1206    Clinical Impression Statement Todays session focused on combination of strength and balance. Patient tolerated session well, with fatigue he noted slight light-headed feeling that went away within 2-3 seconds of resting during standing. Patient offered water and to rest in a chair but denied with symptoms resolving quickly. Tolerated addition of step height well today and presents with mild to moderate postual compensation with dynamic balance activities.    Rehab Potential Excellent   Clinical Impairments Affecting Rehab Potential (+) motivated to participate, success with PT in the past   PT Frequency 3x / week   PT Duration 3 weeks   PT Treatment/Interventions ADLs/Self Care Home Management;Cryotherapy;Moist Heat;DME Instruction;Gait training;Stair training;Functional mobility training;Therapeutic activities;Therapeutic exercise;Balance training;Neuromuscular re-education;Patient/family education;Manual techniques;Scar mobilization;Passive range of motion;Dry needling;Taping   PT Next Visit Plan monitor ROM but may focus on functional strength and balance. Continue with appts as scheduled. Stairs.    PT Home Exercise Plan Eval: continuew with HHPT HEP, increase focus on quad sets, heel slides, knee flexion and extension stretches    Consulted and Agree with Plan of Care Patient      Patient will benefit from skilled therapeutic intervention in order to improve the following deficits and impairments:  Abnormal gait, Decreased skin integrity, Pain, Improper body mechanics, Decreased coordination, Decreased mobility, Decreased scar mobility, Increased  muscle spasms, Postural dysfunction, Decreased activity tolerance, Decreased range of motion, Decreased strength, Hypomobility, Decreased balance, Decreased safety  awareness, Difficulty walking, Impaired flexibility, Increased edema  Visit Diagnosis: Stiffness of right knee, not elsewhere classified  Localized edema  Difficulty in walking, not elsewhere classified  Unsteadiness on feet  Muscle weakness (generalized)     Problem List Patient Active Problem List   Diagnosis Date Noted  . Primary localized osteoarthritis of right knee 08/11/2017  . Asthma with acute exacerbation 12/08/2016  . Chest pain 10/20/2016  . HTN (hypertension) 10/20/2016  . Morbid obesity due to excess calories ([place]) 06/24/2016  . Cough variant asthma 02/12/2016  . Upper airway cough syndrome 01/24/2016  . 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 Essential hypertension   . Persistent atrial fibrillation ([place])   . Syncope   . Hyperthyroidism   . Hyperlipidemia   . RBBB (right bundle branch block)   . Tricuspid valve disorder   . Aortic valve disorder   . Carotid artery stenosis   . Diabetes ([place])   . Pacemaker   . OSA (obstructive sleep apnea)   . Arthritis   . Sinoatrial node dysfunction (HCC)   . PVC's (premature ventricular contractions) 07/17/2011  . Essential hypertension, benign 01/24/2011  . Premature ventricular contractions 01/24/2011  . PPM-[place] 01/24/2011   Clarence Dawson PT, DPT 12:11 PM, 10/09/17 [place], [place], [address] Phone: [phone]   Fax:  [phone]  Name: Clarence Dawson MRN: [id number] Date of Birth: August 03, 1946

## 2017-10-12 ENCOUNTER — Ambulatory Visit (HOSPITAL_COMMUNITY): Payer: Medicare Other | Admitting: Physical Therapy

## 2017-10-12 ENCOUNTER — Encounter (HOSPITAL_COMMUNITY): Payer: Self-pay | Admitting: Physical Therapy

## 2017-10-12 DIAGNOSIS — M25661 Stiffness of right knee, not elsewhere classified: Secondary | ICD-10-CM | POA: Diagnosis not present

## 2017-10-12 DIAGNOSIS — R600 Localized edema: Secondary | ICD-10-CM | POA: Diagnosis not present

## 2017-10-12 DIAGNOSIS — R2681 Unsteadiness on feet: Secondary | ICD-10-CM

## 2017-10-12 DIAGNOSIS — M6281 Muscle weakness (generalized): Secondary | ICD-10-CM | POA: Diagnosis not present

## 2017-10-12 DIAGNOSIS — R262 Difficulty in walking, not elsewhere classified: Secondary | ICD-10-CM | POA: Diagnosis not present

## 2017-10-12 NOTE — Therapy (Signed)
[place], [place], [address] Phone: [phone]   Fax:  [phone]  Physical Therapy Treatment  Patient Details  Name: Clarence Dawson MRN: [id number] Date of Birth: [birth year] Referring Provider: Elsie Saas  Encounter Date: 10/12/2017      PT End of Session - 10/12/17 1202    Visit Number 15   Number of Visits 19   Date for PT Re-Evaluation 10/16/17   Authorization Type Medicare/[employer] Indemnity (G-codes done 10th session)   Authorization Time Period 09/07/17 to 10/19/17   Authorization - Visit Number 15   Authorization - Number of Visits 20   PT Start Time 1115   PT Stop Time 1156   PT Time Calculation (min) 41 min   Activity Tolerance Patient tolerated treatment well   Behavior During Therapy [facility] for tasks assessed/performed      Past Medical History:  Diagnosis Date  . Allergic rhinitis   . Aortic valve disorder   . Asthma    since childhood- seasonal allergies induced  . Cancer ([place])    Skin cancer- squamous, basal  . Carotid artery stenosis   . Essential hypertension   . Full dentures   . GERD (gastroesophageal reflux disease)   . H/O hiatal hernia   . Hemorrhage of rectum   . Hyperlipidemia   . Hypothyroidism   . 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 OSA (obstructive sleep apnea)   . Osteoarthritis   . Pacemaker    Oct 2005 in [place].  Marland Kitchen PAF (paroxysmal atrial fibrillation) ([place])   . Pneumonia    "several Times" 2015 last time  . Primary localized osteoarthritis of right knee 08/11/2017  . RBBB (right bundle branch block)   . Sinoatrial node dysfunction (HCC)   . Syncope   . Tricuspid valve disorder   . Type 2 diabetes mellitus ([place])    Type II    Past Surgical History:  Procedure Laterality Date  . A-V CARDIAC PACEMAKER INSERTION     Sick sinus syndrome DDR pacer  . Arthropathy Right 2005   Rebuilding of left thumb and joint   . CARDIAC CATHETERIZATION    . CARDIAC ELECTROPHYSIOLOGY  STUDY AND ABLATION  09/2008   for pvcs, Dr. Loralie Champagne  . CARDIOVERSION N/A 12/18/2016   Procedure: CARDIOVERSION;  Surgeon: Pixie Casino, MD;  Location: [facility] ENDOSCOPY;  Service: Cardiovascular;  Laterality: N/A;  . [place]   right wrist  . CARPAL TUNNEL RELEASE  05/04/2012   Procedure: CARPAL TUNNEL RELEASE;  Surgeon: Wynonia Sours, MD;  Location: [place];  Service: Orthopedics;  Laterality: Left;  . CARPOMETACARPEL SUSPENSION PLASTY Right 11/16/2014   Procedure: SUSPENSIONPLASTY RIGHT THUMB TENDON TRANSFER ABDUCTOR POLLICUS LONGUS EXCISION TRAPEZIUM;  Surgeon: Daryll Brod, MD;  Location: [place];  Service: Orthopedics;  Laterality: Right;  . CHOLECYSTECTOMY  1994  . CIRCUMCISION    . COLONOSCOPY N/A 03/14/2013   Procedure: COLONOSCOPY;  Surgeon: Daneil Dolin, MD;  [facility];  Service: Endoscopy;  Laterality: N/A;  8:15 AM  . EYE SURGERY     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  . EYE SURGERY  2012   Left eye Corneal transplant- partial- Cataract  . GALLBLADDER SURGERY  12/01/2006  . HAND TENDON SURGERY Left late 1990's   thumb  . HEMORROIDECTOMY  2003  . INJECTION KNEE Left 08/26/2017   Procedure: LEFT KNEE INJECTION;  Surgeon: Elsie Saas, MD;  Location: [place];  Service: Orthopedics;  Laterality: Left;  . left Knee Arthroscopy     April 21 2011- [facility] center  . PARTIAL KNEE ARTHROPLASTY  11/22/2012   Procedure: UNICOMPARTMENTAL KNEE;  Surgeon: Lorn Junes, MD;  Location: [place];  Service: Orthopedics;  Laterality: Left;  left unicompartmental knee arthroplasty  . PERMANENT PACEMAKER GENERATOR CHANGE N/A 01/12/2013   Procedure: PERMANENT PACEMAKER GENERATOR CHANGE;  Surgeon: Evans Lance, MD;  Location: [facility] CATH LAB;  Service: Cardiovascular;  Laterality: N/A;  . Rotator cuff Surgery  2001   Right shoulder  . TONSILLECTOMY    . TOTAL KNEE ARTHROPLASTY Right 08/26/2017   Procedure: TOTAL KNEE  ARTHROPLASTY;  Surgeon: Elsie Saas, MD;  Location: [place];  Service: Orthopedics;  Laterality: Right;    There were no vitals filed for this visit.      Subjective Assessment - 10/12/17 1117    Subjective Patient arrives stating he is doing well, they just have had a lot of people over recently due to power outages. He felt good after last session.    Pertinent History HTN, hx PVCs, R BBB, heart valve disorders, OSA, DM, Pacemaker (recent disfunction but MD has repaired and is monitoring), L partial knee, R knee replacement 08/26/17)   Patient Stated Goals be able to go on vacation and walk the beach    Currently in Pain? Yes   Pain Score 2    Pain Location Knee   Pain Orientation Right   Pain Descriptors / Indicators Sore   Pain Type Chronic pain   Pain Radiating Towards none    Pain Onset More than a month ago   Pain Frequency Constant   Aggravating Factors  activity    Pain Relieving Factors ice    Effect of Pain on Daily Activities min-moderate                         [facility] Adult PT Treatment/Exercise - 10/12/17 0001      Knee/Hip Exercises: Stretches   Active Hamstring Stretch 3 reps;30 seconds;Both   Active Hamstring Stretch Limitations 12 inch box    Quad Stretch 3 reps;30 seconds;Right   Quad Stretch Limitations prone    Knee: Self-Stretch to increase Flexion Right;10 seconds   Knee: Self-Stretch Limitations x10    Gastroc Stretch Both;3 reps;30 seconds   Gastroc Stretch Limitations slantboard      Knee/Hip Exercises: Standing   Heel Raises Both;1 set;15 reps   Heel Raises Limitations U heel raises, toe raises on incline    Forward Lunges Both;1 set;15 reps   Forward Lunges Limitations 4 inch box    Lateral Step Up Right;Both;1 set;15 reps   Lateral Step Up Limitations 8 inch box    Forward Step Up Both;1 set;15 reps   Forward Step Up Limitations 8 inch box    Step Down Both;1 set;15 reps   Step Down Limitations 6 inch box   6 inch box R, 4  inch box L due to pain L knee    Functional Squat --   Functional Squat Limitations --     Knee/Hip Exercises: Seated   Sit to Sand 15 reps;without UE support;1 set  eccentric lower              Balance Exercises - 10/12/17 1146      Balance Exercises: Standing   Tandem Stance Eyes open;Foam/compliant surface;3 reps;20 secs   Tandem Gait Forward;Retro;4 reps  in // bars  Retro Gait 4 reps;Other (comment)  // bars    Other Standing Exercises cone taps 4 cones/combo of 2: solid surface            PT Education - 10/12/17 1201    Education provided Yes   Education Details quad stretch HEP    Person(s) Educated Patient   Methods Explanation;Handout   Comprehension Verbalized understanding;Need further instruction          PT Short Term Goals - 09/30/17 1139      PT SHORT TERM GOAL #1   Title Patient to demonstrate R knee ROM as being 0-115 in order to improve gait mechanics and overall mobility    Baseline 10/3- 5-107   Time 3   Period Weeks   Status On-going     PT SHORT TERM GOAL #2   Title Patient to be consistently ambulating with SPC, consistent heel-toe mechanics and minimal unsteadiness, in order to show improved balance and general mobility    Baseline 10/3- looking great    Time 3   Period Weeks   Status Achieved     PT SHORT TERM GOAL #3   Title Patient to be able to complete sit to stand with no UEs and no weight shift to L LE in order to show improved proprioception and strength in surgical limb    Baseline 10/3-  continues to have offshift    Time 3   Period Weeks   Status On-going     PT SHORT TERM GOAL #4   Title Patient to correctly and consistently perform appropriate HEP, to be updated PRN    Baseline 10/3- compliant    Time 1   Period Weeks   Status Achieved           PT Long Term Goals - 09/30/17 1142      PT LONG TERM GOAL #1   Title Patient to demonstrate strength 5/5 in all tested muscle groups in order to assist in  maintaining correct functional posture and reducing pain    Baseline 10/3- virtually met    Time 6   Period Weeks   Status Achieved     PT LONG TERM GOAL #2   Title Patient to be able to ambulate over level surfaces with no device and unsteady/uneven surfaces with cane, correct heel-toe pattern and minimal unsteadiness, in order to improve mobiilty and improved home/community access    Baseline 10/3- continues to use AD outside    Time 6   Period Weeks   Status On-going     PT LONG TERM GOAL #3   Title Patient to be able to complete TUG in 12 seconds or less with no device and maintain SLS for at least 15 seconds on level surface in order to demonstrate improved functional balance    Baseline 10/3- 8.75    Time 6   Period Weeks   Status Achieved     PT LONG TERM GOAL #4   Title Patient to reciprocally ascend/descend stairs with U railing, minimal unsteadiness, good eccentric control in order to improve home/community access    Baseline 10/3- functional performance, mild difficulty    Time 6   Period Weeks   Status Partially Met               Plan - 10/12/17 1202    Clinical Impression Statement Patient arrives stating he is doing well, he has no functional problems besides just some soreness in his knee at this point that  he has noticed. Continued with functional strengthening and balance training today with patient doing very well this session. Recommend completing remaining sessions, followed by discharge at the end of the week due to high functional status/satisfaction with current level of function.    Rehab Potential Excellent   Clinical Impairments Affecting Rehab Potential (+) motivated to participate, success with PT in the past   PT Frequency 3x / week   PT Duration 3 weeks   PT Treatment/Interventions ADLs/Self Care Home Management;Cryotherapy;Moist Heat;DME Instruction;Gait training;Stair training;Functional mobility training;Therapeutic activities;Therapeutic  exercise;Balance training;Neuromuscular re-education;Patient/family education;Manual techniques;Scar mobilization;Passive range of motion;Dry needling;Taping   PT Next Visit Plan monitor ROM but may focus on functional strength and balance. Continue with appts as scheduled. Stairs. DC at end of week (10/16/17). HEP updates    PT Home Exercise Plan Eval: continuew with HHPT HEP, increase focus on quad sets, heel slides, knee flexion and extension stretches; quad stretch    Consulted and Agree with Plan of Care Patient      Patient will benefit from skilled therapeutic intervention in order to improve the following deficits and impairments:  Abnormal gait, Decreased skin integrity, Pain, Improper body mechanics, Decreased coordination, Decreased mobility, Decreased scar mobility, Increased muscle spasms, Postural dysfunction, Decreased activity tolerance, Decreased range of motion, Decreased strength, Hypomobility, Decreased balance, Decreased safety awareness, Difficulty walking, Impaired flexibility, Increased edema  Visit Diagnosis: Stiffness of right knee, not elsewhere classified  Localized edema  Difficulty in walking, not elsewhere classified  Unsteadiness on feet     Problem List Patient Active Problem List   Diagnosis Date Noted  . Primary localized osteoarthritis of right knee 08/11/2017  . Asthma with acute exacerbation 12/08/2016  . Chest pain 10/20/2016  . HTN (hypertension) 10/20/2016  . Morbid obesity due to excess calories ([place]) 06/24/2016  . Cough variant asthma 02/12/2016  . Upper airway cough syndrome 01/24/2016  . 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 Essential hypertension   . Persistent atrial fibrillation ([place])   . Syncope   . Hyperthyroidism   . Hyperlipidemia   . RBBB (right bundle branch block)   . Tricuspid valve disorder   . Aortic valve disorder   . Carotid artery stenosis   . Diabetes ([place])   . Pacemaker   . OSA (obstructive sleep apnea)   . Arthritis    . Sinoatrial node dysfunction (HCC)   . PVC's (premature ventricular contractions) 07/17/2011  . Essential hypertension, benign 01/24/2011  . Premature ventricular contractions 01/24/2011  . PPM-[place] 01/24/2011    Deniece Ree PT, DPT [phone]  [facility] [address] [place], [place], [address] Phone: [phone]   Fax:  [phone]  Name: Clarence [place] MRN: [id number] Date of Birth: 1946-03-14

## 2017-10-12 NOTE — Patient Instructions (Signed)
   Prone Quad Stretch  Lie down flat on your stomach. Wrap a strap (belt, towel, dog leash) around the top of one of your feet and pull the strap across your opposite shoulder so that your knee starts to curl up to your body. Pull until a stretch is felt across the front of your thigh.   Hold for 30 seconds, repeat 3 times, 2-3 times per day.

## 2017-10-13 DIAGNOSIS — M170 Bilateral primary osteoarthritis of knee: Secondary | ICD-10-CM | POA: Diagnosis not present

## 2017-10-14 ENCOUNTER — Encounter (HOSPITAL_COMMUNITY): Payer: Self-pay | Admitting: Physical Therapy

## 2017-10-14 ENCOUNTER — Ambulatory Visit (HOSPITAL_COMMUNITY): Payer: Medicare Other | Admitting: Physical Therapy

## 2017-10-14 DIAGNOSIS — M25661 Stiffness of right knee, not elsewhere classified: Secondary | ICD-10-CM | POA: Diagnosis not present

## 2017-10-14 DIAGNOSIS — M6281 Muscle weakness (generalized): Secondary | ICD-10-CM

## 2017-10-14 DIAGNOSIS — R2681 Unsteadiness on feet: Secondary | ICD-10-CM

## 2017-10-14 DIAGNOSIS — R262 Difficulty in walking, not elsewhere classified: Secondary | ICD-10-CM

## 2017-10-14 DIAGNOSIS — R600 Localized edema: Secondary | ICD-10-CM

## 2017-10-14 NOTE — Therapy (Signed)
[place], [place], [address] Phone: [phone]   Fax:  [phone]  Physical Therapy Treatment (Re-Assessment)  Patient Details  Name: Clarence Dawson MRN: [id number] Date of Birth: [birth year] Referring Provider: Elsie Saas   Encounter Date: 10/14/2017      PT End of Session - 10/14/17 1203    Visit Number 16   Number of Visits 17   Date for PT Re-Evaluation 10/28/17   Authorization Type Medicare/[employer] Indemnity (G-codes done 10th session)   Authorization Time Period 09/07/17 to 10/19/17   Authorization - Visit Number 16   Authorization - Number of Visits 20   PT Start Time 1115   PT Stop Time 1155   PT Time Calculation (min) 40 min   Activity Tolerance Patient tolerated treatment well   Behavior During Therapy [facility] for tasks assessed/performed      Past Medical History:  Diagnosis Date  . Allergic rhinitis   . Aortic valve disorder   . Asthma    since childhood- seasonal allergies induced  . Cancer ([place])    Skin cancer- squamous, basal  . Carotid artery stenosis   . Essential hypertension   . Full dentures   . GERD (gastroesophageal reflux disease)   . H/O hiatal hernia   . Hemorrhage of rectum   . Hyperlipidemia   . Hypothyroidism   . 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 OSA (obstructive sleep apnea)   . Osteoarthritis   . Pacemaker    Oct 2005 in [place].  Marland Kitchen PAF (paroxysmal atrial fibrillation) ([place])   . Pneumonia    "several Times" 2015 last time  . Primary localized osteoarthritis of right knee 08/11/2017  . RBBB (right bundle branch block)   . Sinoatrial node dysfunction (HCC)   . Syncope   . Tricuspid valve disorder   . Type 2 diabetes mellitus ([place])    Type II    Past Surgical History:  Procedure Laterality Date  . A-V CARDIAC PACEMAKER INSERTION     Sick sinus syndrome DDR pacer  . Arthropathy Right 2005   Rebuilding of left thumb and joint   . CARDIAC CATHETERIZATION    . CARDIAC  ELECTROPHYSIOLOGY STUDY AND ABLATION  09/2008   for pvcs, Dr. Loralie Champagne  . CARDIOVERSION N/A 12/18/2016   Procedure: CARDIOVERSION;  Surgeon: Pixie Casino, MD;  Location: [facility] ENDOSCOPY;  Service: Cardiovascular;  Laterality: N/A;  . [place]   right wrist  . CARPAL TUNNEL RELEASE  05/04/2012   Procedure: CARPAL TUNNEL RELEASE;  Surgeon: Wynonia Sours, MD;  Location: [place];  Service: Orthopedics;  Laterality: Left;  . CARPOMETACARPEL SUSPENSION PLASTY Right 11/16/2014   Procedure: SUSPENSIONPLASTY RIGHT THUMB TENDON TRANSFER ABDUCTOR POLLICUS LONGUS EXCISION TRAPEZIUM;  Surgeon: Daryll Brod, MD;  Location: [place];  Service: Orthopedics;  Laterality: Right;  . CHOLECYSTECTOMY  1994  . CIRCUMCISION    . COLONOSCOPY N/A 03/14/2013   Procedure: COLONOSCOPY;  Surgeon: Daneil Dolin, MD;  [facility];  Service: Endoscopy;  Laterality: N/A;  8:15 AM  . 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  . EYE SURGERY  2012   Left eye Corneal transplant- partial- Cataract  . GALLBLADDER SURGERY  12/01/2006  . HAND TENDON SURGERY Left late 1990's   thumb  . HEMORROIDECTOMY  2003  . INJECTION KNEE Left 08/26/2017   Procedure: LEFT KNEE INJECTION;  Surgeon: Elsie Saas, MD;  Location: [facility]  OR;  Service: Orthopedics;  Laterality: Left;  . left Knee Arthroscopy     [facility]- [facility]  . PARTIAL KNEE ARTHROPLASTY  11/22/2012   Procedure: UNICOMPARTMENTAL KNEE;  Surgeon: Lorn Junes, MD;  Location: [place];  Service: Orthopedics;  Laterality: Left;  left unicompartmental knee arthroplasty  . PERMANENT PACEMAKER GENERATOR CHANGE N/A 01/12/2013   Procedure: PERMANENT PACEMAKER GENERATOR CHANGE;  Surgeon: Evans Lance, MD;  Location: [facility] CATH LAB;  Service: Cardiovascular;  Laterality: N/A;  . Rotator cuff Surgery  2001   Right shoulder  . TONSILLECTOMY    . TOTAL KNEE ARTHROPLASTY Right 08/26/2017   Procedure:  TOTAL KNEE ARTHROPLASTY;  Surgeon: Elsie Saas, MD;  Location: [place];  Service: Orthopedics;  Laterality: Right;    There were no vitals filed for this visit.      Subjective Assessment - 10/14/17 1120    Subjective patient arrives stating his MD has requested and written an order for 1x/week for 4 more weeks for continued focus on strength and balance; he feels very good otheriwse, he rates himself as being 75% right now. he feels very good about his knee in general.    Pertinent History HTN, hx PVCs, R BBB, heart valve disorders, OSA, DM, Pacemaker (recent disfunction but MD has repaired and is monitoring), L partial knee, R knee replacement 08/26/17)   How long can you sit comfortably? 10/17- 2 hours    How long can you stand comfortably? 10/17- 30-45 minutes    How long can you walk comfortably? 10/17- 5 hours    Patient Stated Goals be able to go on vacation and walk the beach    Currently in Pain? No/denies            [facility] PT Assessment - 10/14/17 0001      Assessment   Medical Diagnosis R TKR    Referring Provider Elsie Saas    Onset Date/Surgical Date 08/26/17   Next MD Visit Dr. Noemi Chapel 11/10/17   Prior Therapy HHPT but has been discharged      Precautions   Precautions None     Restrictions   Weight Bearing Restrictions No     Balance Screen   Has the patient fallen in the past 6 months No   Has the patient had a decrease in activity level because of a fear of falling?  No   Is the patient reluctant to leave their home because of a fear of falling?  No     Prior Function   Level of Independence Independent;Independent with basic ADLs;Independent with gait;Independent with transfers   Vocation Retired     AROM   Right Knee Extension 3   Right Knee Flexion 110     Strength   Right Hip Flexion 5/5   Right Hip Extension 4+/5   Right Hip ABduction 5/5   Left Hip Flexion 4+/5   Left Hip Extension 5/5   Left Hip ABduction 5/5   Right Knee Flexion 5/5    Right Knee Extension 5/5   Left Knee Flexion 5/5   Left Knee Extension 5/5   Right Ankle Dorsiflexion 5/5   Left Ankle Dorsiflexion 5/5     Ambulation/Gait   Gait Comments WFL      6 minute walk test results    Aerobic Endurance Distance Walked 748   Endurance additional comments 3MWT no device      High Level Balance   High Level Balance Comments TUG 8.2  [place] Adult PT Treatment/Exercise - 10/14/17 0001      Knee/Hip Exercises: Stretches   Knee: Self-Stretch to increase Flexion Right;10 seconds   Knee: Self-Stretch Limitations x10    Gastroc Stretch 4 reps;30 seconds   Gastroc Stretch Limitations slantboard      Knee/Hip Exercises: Standing   Forward Step Up Both;1 set;20 reps   Forward Step Up Limitations 8 inch box     Lateral step ups 8 inch box 1x20 B             PT Education - 10/14/17 1202    Education provided Yes   Education Details exam findings today, POC moving forward, progress towards goals, bicycle at gym    Person(s) Educated Patient   Methods Explanation   Comprehension Verbalized understanding          PT Short Term Goals - 10/14/17 1137      PT SHORT TERM GOAL #1   Title Patient to demonstrate R knee ROM as being 0-115 in order to improve gait mechanics and overall mobility    Baseline 10/17- 1-118 at bset; depends on warm up but ROM is available    Time 3   Period Weeks   Status Partially Met     PT SHORT TERM GOAL #2   Title Patient to be consistently ambulating with SPC, consistent heel-toe mechanics and minimal unsteadiness, in order to show improved balance and general mobility    Baseline 10/3- looking great    Time 3   Period Weeks   Status Achieved     PT SHORT TERM GOAL #3   Title Patient to be able to complete sit to stand with no UEs and no weight shift to L LE in order to show improved proprioception and strength in surgical limb    Baseline 10/17- mild offshift but very much improved     Time 3   Period Weeks   Status Partially Met     PT SHORT TERM GOAL #4   Title Patient to correctly and consistently perform appropriate HEP, to be updated PRN    Baseline 10/17- "going"    Time 1   Period Weeks   Status Achieved           PT Long Term Goals - 10/14/17 1139      PT LONG TERM GOAL #1   Title Patient to demonstrate strength 5/5 in all tested muscle groups in order to assist in maintaining correct functional posture and reducing pain    Time 6   Period Weeks   Status Achieved     PT LONG TERM GOAL #2   Title Patient to be able to ambulate over level surfaces with no device and unsteady/uneven surfaces with cane, correct heel-toe pattern and minimal unsteadiness, in order to improve mobiilty and improved home/community access    Time 6   Period Weeks   Status Achieved     PT LONG TERM GOAL #3   Title Patient to be able to complete TUG in 12 seconds or less with no device and maintain SLS for at least 15 seconds on level surface in order to demonstrate improved functional balance    Baseline 10/17 -8.2   Time 6   Period Weeks   Status Achieved     PT LONG TERM GOAL #4   Title Patient to reciprocally ascend/descend stairs with U railing, minimal unsteadiness, good eccentric control in order to improve home/community access    Baseline 10/17- [facility]  Time 6   Period Weeks   Status Achieved               Plan - 10/14/17 1203    Clinical Impression Statement Re-assessment performed today. Patient has made excellent progress with skilled PT services and has met or partially met all goals at this point and reports he is able to do everything he needs and wants at this time. Note that MD has suggested an additional 4 visits at 1x/week, however patient is at a very high functional level at this time, and declines additional sessions as suggested in MD order. Recommend one additional session for prescription of final HEP before DC.     Rehab Potential  Excellent   Clinical Impairments Affecting Rehab Potential (+) motivated to participate, success with PT in the past   PT Frequency Other (comment)  1 more session    PT Duration Other (comment)  1 more session    PT Treatment/Interventions ADLs/Self Care Home Management;Cryotherapy;Moist Heat;DME Instruction;Gait training;Stair training;Functional mobility training;Therapeutic activities;Therapeutic exercise;Balance training;Neuromuscular re-education;Patient/family education;Manual techniques;Scar mobilization;Passive range of motion;Dry needling;Taping   PT Next Visit Plan HEP updates for functional strength and calf stretch; DC 10/16/17   PT Home Exercise Plan Eval: continuew with HHPT HEP, increase focus on quad sets, heel slides, knee flexion and extension stretches; quad stretch    Consulted and Agree with Plan of Care Patient      Patient will benefit from skilled therapeutic intervention in order to improve the following deficits and impairments:  Abnormal gait, Decreased skin integrity, Pain, Improper body mechanics, Decreased coordination, Decreased mobility, Decreased scar mobility, Increased muscle spasms, Postural dysfunction, Decreased activity tolerance, Decreased range of motion, Decreased strength, Hypomobility, Decreased balance, Decreased safety awareness, Difficulty walking, Impaired flexibility, Increased edema  Visit Diagnosis: Stiffness of right knee, not elsewhere classified  Localized edema  Difficulty in walking, not elsewhere classified  Unsteadiness on feet  Muscle weakness (generalized)     Problem List Patient Active Problem List   Diagnosis Date Noted  . Primary localized osteoarthritis of right knee 08/11/2017  . Asthma with acute exacerbation 12/08/2016  . Chest pain 10/20/2016  . HTN (hypertension) 10/20/2016  . Morbid obesity due to excess calories ([place]) 06/24/2016  . Cough variant asthma 02/12/2016  . Upper airway cough syndrome 01/24/2016   . 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 Essential hypertension   . Persistent atrial fibrillation ([place])   . Syncope   . Hyperthyroidism   . Hyperlipidemia   . RBBB (right bundle branch block)   . Tricuspid valve disorder   . Aortic valve disorder   . Carotid artery stenosis   . Diabetes ([place])   . Pacemaker   . OSA (obstructive sleep apnea)   . Arthritis   . Sinoatrial node dysfunction (HCC)   . PVC's (premature ventricular contractions) 07/17/2011  . Essential hypertension, benign 01/24/2011  . Premature ventricular contractions 01/24/2011  . PPM-[place] 01/24/2011    Deniece Ree PT, DPT [phone]  [place] [address] [place], [place], [address] Phone: [phone]   Fax:  [phone]  Name: Clarence Dawson MRN: [id number] Date of Birth: 1946-02-04

## 2017-10-16 ENCOUNTER — Ambulatory Visit (HOSPITAL_COMMUNITY): Payer: Medicare Other

## 2017-10-16 DIAGNOSIS — R262 Difficulty in walking, not elsewhere classified: Secondary | ICD-10-CM | POA: Diagnosis not present

## 2017-10-16 DIAGNOSIS — M25661 Stiffness of right knee, not elsewhere classified: Secondary | ICD-10-CM | POA: Diagnosis not present

## 2017-10-16 DIAGNOSIS — R600 Localized edema: Secondary | ICD-10-CM

## 2017-10-16 DIAGNOSIS — M6281 Muscle weakness (generalized): Secondary | ICD-10-CM

## 2017-10-16 DIAGNOSIS — R2681 Unsteadiness on feet: Secondary | ICD-10-CM | POA: Diagnosis not present

## 2017-10-16 NOTE — Therapy (Signed)
[place], [place], [address] Phone: [phone]   Fax:  [phone]  Physical Therapy Treatment/Discharge Summary  Patient Details  Name: Clarence Dawson MRN: [id number] Date of Birth: 07-13-1946 Referring Provider: Elsie Saas   Encounter Date: 10/16/2017      PT End of Session - 10/16/17 1123    Visit Number 17   Number of Visits 17   Date for PT Re-Evaluation 10/28/17   Authorization Type Medicare/[employer] Indemnity (G-codes done 10th session)   Authorization Time Period 09/07/17 to 10/19/17   Authorization - Visit Number 18   Authorization - Number of Visits 20   PT Start Time 1120   PT Stop Time 1148   PT Time Calculation (min) 28 min   Activity Tolerance Patient tolerated treatment well   Behavior During Therapy [facility] for tasks assessed/performed      Past Medical History:  Diagnosis Date  . Allergic rhinitis   . Aortic valve disorder   . Asthma    since childhood- seasonal allergies induced  . Cancer ([place])    Skin cancer- squamous, basal  . Carotid artery stenosis   . Essential hypertension   . Full dentures   . GERD (gastroesophageal reflux disease)   . H/O hiatal hernia   . Hemorrhage of rectum   . Hyperlipidemia   . Hypothyroidism   . 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 OSA (obstructive sleep apnea)   . Osteoarthritis   . Pacemaker    Oct 2005 in [place].  Marland Kitchen PAF (paroxysmal atrial fibrillation) ([place])   . Pneumonia    "several Times" 2015 last time  . Primary localized osteoarthritis of right knee 08/11/2017  . RBBB (right bundle branch block)   . Sinoatrial node dysfunction (HCC)   . Syncope   . Tricuspid valve disorder   . Type 2 diabetes mellitus ([place])    Type II    Past Surgical History:  Procedure Laterality Date  . A-V CARDIAC PACEMAKER INSERTION     Sick sinus syndrome DDR pacer  . Arthropathy Right 2005   Rebuilding of left thumb and joint   . CARDIAC CATHETERIZATION    . CARDIAC  ELECTROPHYSIOLOGY STUDY AND ABLATION  09/2008   for pvcs, Dr. Loralie Champagne  . CARDIOVERSION N/A 12/18/2016   Procedure: CARDIOVERSION;  Surgeon: Pixie Casino, MD;  Location: [facility] ENDOSCOPY;  Service: Cardiovascular;  Laterality: N/A;  . [place]   right wrist  . CARPAL TUNNEL RELEASE  05/04/2012   Procedure: CARPAL TUNNEL RELEASE;  Surgeon: Wynonia Sours, MD;  Location: [place];  Service: Orthopedics;  Laterality: Left;  . CARPOMETACARPEL SUSPENSION PLASTY Right 11/16/2014   Procedure: SUSPENSIONPLASTY RIGHT THUMB TENDON TRANSFER ABDUCTOR POLLICUS LONGUS EXCISION TRAPEZIUM;  Surgeon: Daryll Brod, MD;  Location: [place];  Service: Orthopedics;  Laterality: Right;  . CHOLECYSTECTOMY  1994  . CIRCUMCISION    . COLONOSCOPY N/A 03/14/2013   Procedure: COLONOSCOPY;  Surgeon: Daneil Dolin, MD;  [facility];  Service: Endoscopy;  Laterality: N/A;  8:15 AM  . EYE SURGERY     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-[facility]  . EYE SURGERY  2012   Left eye Corneal transplant- partial- Cataract  . GALLBLADDER SURGERY  12/01/2006  . HAND TENDON SURGERY Left late 1990's   thumb  . HEMORROIDECTOMY  2003  . INJECTION KNEE Left 08/26/2017   Procedure: LEFT KNEE INJECTION;  Surgeon: Elsie Saas, MD;  Location: [facility]  OR;  Service: Orthopedics;  Laterality: Left;  . left Knee Arthroscopy     April 21 2011- Day Surgery center  . PARTIAL KNEE ARTHROPLASTY  11/22/2012   Procedure: UNICOMPARTMENTAL KNEE;  Surgeon: Lorn Junes, MD;  Location: [place];  Service: Orthopedics;  Laterality: Left;  left unicompartmental knee arthroplasty  . PERMANENT PACEMAKER GENERATOR CHANGE N/A 01/12/2013   Procedure: PERMANENT PACEMAKER GENERATOR CHANGE;  Surgeon: Evans Lance, MD;  Location: [facility] CATH LAB;  Service: Cardiovascular;  Laterality: N/A;  . Rotator cuff Surgery  2001   Right shoulder  . TONSILLECTOMY    . TOTAL KNEE ARTHROPLASTY Right 08/26/2017   Procedure:  TOTAL KNEE ARTHROPLASTY;  Surgeon: Elsie Saas, MD;  Location: [place];  Service: Orthopedics;  Laterality: Right;    There were no vitals filed for this visit.      Subjective Assessment - 10/16/17 1121    Subjective Pt states that he is stiff this morning.    Pertinent History HTN, hx PVCs, R BBB, heart valve disorders, OSA, DM, Pacemaker (recent disfunction but MD has repaired and is monitoring), L partial knee, R knee replacement 08/26/17)   How long can you sit comfortably? 10/17- 2 hours    How long can you stand comfortably? 10/17- 30-45 minutes    How long can you walk comfortably? 10/17- 5 hours    Patient Stated Goals be able to go on vacation and walk the beach    Currently in Pain? No/denies           [facility] Adult PT Treatment/Exercise - 10/16/17 0001      Knee/Hip Exercises: Aerobic   Stationary Bike x5 min w/u at beginning of session (unbilled)     Knee/Hip Exercises: Supine   Knee Extension Limitations 0   Knee Flexion Limitations 117           PT Education - 10/16/17 1124    Education provided Yes   Education Details discharge plans and strengthening HEP   Person(s) Educated Patient   Methods Explanation;Demonstration;Handout   Comprehension Verbalized understanding;Returned demonstration          PT Short Term Goals - 10/16/17 1152      PT SHORT TERM GOAL #1   Title Patient to demonstrate R knee ROM as being 0-115 in order to improve gait mechanics and overall mobility    Baseline 10/19: 0-117   Time 3   Period Weeks   Status Achieved     PT SHORT TERM GOAL #2   Title Patient to be consistently ambulating with SPC, consistent heel-toe mechanics and minimal unsteadiness, in order to show improved balance and general mobility    Baseline 10/3- looking great    Time 3   Period Weeks   Status Achieved     PT SHORT TERM GOAL #3   Title Patient to be able to complete sit to stand with no UEs and no weight shift to L LE in order to show improved  proprioception and strength in surgical limb    Baseline 10/17- mild offshift but very much improved    Time 3   Period Weeks   Status Partially Met     PT SHORT TERM GOAL #4   Title Patient to correctly and consistently perform appropriate HEP, to be updated PRN    Baseline 10/17- "going"    Time 1   Period Weeks   Status Achieved           PT Long Term Goals -  October 25, 2017 1152      PT LONG TERM GOAL #1   Title Patient to demonstrate strength 5/5 in all tested muscle groups in order to assist in maintaining correct functional posture and reducing pain    Time 6   Period Weeks   Status Achieved     PT LONG TERM GOAL #2   Title Patient to be able to ambulate over level surfaces with no device and unsteady/uneven surfaces with cane, correct heel-toe pattern and minimal unsteadiness, in order to improve mobiilty and improved home/community access    Time 6   Period Weeks   Status Achieved     PT LONG TERM GOAL #3   Title Patient to be able to complete TUG in 12 seconds or less with no device and maintain SLS for at least 15 seconds on level surface in order to demonstrate improved functional balance    Baseline 10/17 -8.2   Time 6   Period Weeks   Status Achieved     PT LONG TERM GOAL #4   Title Patient to reciprocally ascend/descend stairs with U railing, minimal unsteadiness, good eccentric control in order to improve home/community access    Baseline 10/17- WFL    Time 6   Period Weeks   Status Achieved               Plan - October 25, 2017 1149    Clinical Impression Statement Today was pt's last scheduled appointment and 17/17th visit. Formal reassessment performed last session so did not check pt's goals this date. Per POC, provided pt with extensive strengthening exercises and went through each in detail with demonstration of understanding. Pt is ready for discharge at this time.   Rehab Potential Excellent   Clinical Impairments Affecting Rehab Potential (+)  motivated to participate, success with PT in the past   PT Frequency Other (comment)  1 more session    PT Duration Other (comment)  1 more session    PT Treatment/Interventions ADLs/Self Care Home Management;Cryotherapy;Moist Heat;DME Instruction;Gait training;Stair training;Functional mobility training;Therapeutic activities;Therapeutic exercise;Balance training;Neuromuscular re-education;Patient/family education;Manual techniques;Scar mobilization;Passive range of motion;Dry needling;Taping   PT Next Visit Plan discharge   PT Home Exercise Plan Eval: continuew with HHPT HEP, increase focus on quad sets, heel slides, knee flexion and extension stretches; quad stretch; 2023/10/26: see extensive list below   Consulted and Agree with Plan of Care Patient      Patient will benefit from skilled therapeutic intervention in order to improve the following deficits and impairments:  Abnormal gait, Decreased skin integrity, Pain, Improper body mechanics, Decreased coordination, Decreased mobility, Decreased scar mobility, Increased muscle spasms, Postural dysfunction, Decreased activity tolerance, Decreased range of motion, Decreased strength, Hypomobility, Decreased balance, Decreased safety awareness, Difficulty walking, Impaired flexibility, Increased edema  Visit Diagnosis: Stiffness of right knee, not elsewhere classified  Localized edema  Difficulty in walking, not elsewhere classified  Unsteadiness on feet  Muscle weakness (generalized)       G-Codes - 10/25/17 1152    Functional Assessment Tool Used (Outpatient Only) Based on skilled clinical assessment of ROM, strength, gait, balance   Functional Limitation Mobility: Walking and moving around   Mobility: Walking and Moving Around Goal Status [phone]) At least 1 percent but less than 20 percent impaired, limited or restricted   Mobility: Walking and Moving Around Discharge Status [phone]) At least 1 percent but less than 20 percent impaired,  limited or restricted      Problem List Patient Active Problem List   Diagnosis  Date Noted  . Primary localized osteoarthritis of right knee 08/11/2017  . Asthma with acute exacerbation 12/08/2016  . Chest pain 10/20/2016  . HTN (hypertension) 10/20/2016  . Morbid obesity due to excess calories ([place]) 06/24/2016  . Cough variant asthma 02/12/2016  . Upper airway cough syndrome 01/24/2016  . 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 Essential hypertension   . Persistent atrial fibrillation ([place])   . Syncope   . Hyperthyroidism   . Hyperlipidemia   . RBBB (right bundle branch block)   . Tricuspid valve disorder   . Aortic valve disorder   . Carotid artery stenosis   . Diabetes ([place])   . Pacemaker   . OSA (obstructive sleep apnea)   . Arthritis   . Sinoatrial node dysfunction (HCC)   . PVC's (premature ventricular contractions) 07/17/2011  . Essential hypertension, benign 01/24/2011  . Premature ventricular contractions 01/24/2011  . PPM-[place] 01/24/2011        Geraldine Solar PT, DPT  [place] [address] [place], [place], [address] Phone: [phone]   Fax:  [phone]  Name: Clarence Dawson MRN: [id number] Date of Birth: February 28, 1946

## 2017-10-16 NOTE — Patient Instructions (Signed)
  Step Ups  Using the bottom step of your stairs, step up with your affected leg followed by your unaffected leg. Step back with your affected leg followed by your unaffected leg.   Side Step Ups  Place affected extremity onto step. Step up and touch step with other foot.   Lunges  Take large step forward with one foot and bend at both knees as if driving back knee down toward the floor. Be aware not to let front knee go past toes. Return and repeat with other leg.   SIT TO STAND - NO SUPPORT  Start by scooting close to the front of the chair.  Next, lean forward at your trunk and reach forward with your arms and rise to standing without using your hands to push off from the chair or other object.   Use your arms as a counter-balance by reaching forward when in sitting and lower them as you approach standing.    SQUAT - CHAIR AS GUIDE  While standing with feet shoulder width apart and in front of a chair that is facing you, bend your knees and lower your body towards the floor. The chair seat is a guide so that your knees do not pass over your toes.   Your body weight should mostly be directed through the heels of your feet. Return to a standing position.   Knees should bend in line with the 2nd toe and not pass beyond the toes.    lateral walk at pole or counter   Standing at a support surface (role or counter top) use hands to assist with balance. Perform sideways walking down and back until you feel that you need to take break.   It may be help to place a chair close to allow for safe rest breaks.    LOOPED ELASTIC BAND HIP ABDUCTION  While standing with an elastic band looped around your ankles, move the target leg out to the side as shown.    SINGLE LEG STANCE - SLS  Stand on one leg and maintain your balance. Kick your leg forwards, sideways, and backwards holding for 3-5 seconds at each   Pick 3-4 exercises to do one day, and then the remaining exercises the  next. Stretch every day.  Perform 2-3 sets of 10-15 reps of each strengthening exercises.

## 2017-11-02 ENCOUNTER — Ambulatory Visit (INDEPENDENT_AMBULATORY_CARE_PROVIDER_SITE_OTHER): Payer: Medicare Other | Admitting: *Deleted

## 2017-11-02 DIAGNOSIS — I495 Sick sinus syndrome: Secondary | ICD-10-CM

## 2017-11-02 NOTE — Progress Notes (Signed)
Remote pacemaker transmission.   

## 2017-11-04 ENCOUNTER — Encounter: Payer: Self-pay | Admitting: Cardiology

## 2017-11-10 ENCOUNTER — Encounter: Payer: Self-pay | Admitting: Internal Medicine

## 2017-11-10 ENCOUNTER — Ambulatory Visit (INDEPENDENT_AMBULATORY_CARE_PROVIDER_SITE_OTHER): Payer: Medicare Other | Admitting: Internal Medicine

## 2017-11-10 VITALS — BP 122/78 | HR 73 | Ht 70.0 in | Wt 229.0 lb

## 2017-11-10 DIAGNOSIS — Z950 Presence of cardiac pacemaker: Secondary | ICD-10-CM | POA: Diagnosis not present

## 2017-11-10 DIAGNOSIS — I495 Sick sinus syndrome: Secondary | ICD-10-CM

## 2017-11-10 DIAGNOSIS — I10 Essential (primary) hypertension: Secondary | ICD-10-CM | POA: Diagnosis not present

## 2017-11-10 DIAGNOSIS — I481 Persistent atrial fibrillation: Secondary | ICD-10-CM | POA: Diagnosis not present

## 2017-11-10 DIAGNOSIS — M170 Bilateral primary osteoarthritis of knee: Secondary | ICD-10-CM | POA: Diagnosis not present

## 2017-11-10 DIAGNOSIS — I4819 Other persistent atrial fibrillation: Secondary | ICD-10-CM

## 2017-11-10 LAB — CUP PACEART INCLINIC DEVICE CHECK
Battery Voltage: 2.96 V
Brady Statistic RA Percent Paced: 98 %
Brady Statistic RV Percent Paced: 98 %
Date Time Interrogation Session: 20181113165214
Implantable Lead Implant Date: 20051106
Implantable Lead Location: 753859
Lead Channel Impedance Value: 675 Ohm
Lead Channel Pacing Threshold Amplitude: 0.75 V
Lead Channel Pacing Threshold Amplitude: 1 V
Lead Channel Pacing Threshold Pulse Width: 0.4 ms
Lead Channel Pacing Threshold Pulse Width: 0.4 ms
Lead Channel Sensing Intrinsic Amplitude: 3.5 mV
Lead Channel Sensing Intrinsic Amplitude: 9.7 mV
Lead Channel Setting Pacing Amplitude: 2 V
Lead Channel Setting Pacing Pulse Width: 0.4 ms
MDC IDC LEAD IMPLANT DT: 20051106
MDC IDC LEAD LOCATION: 753860
MDC IDC MSMT BATTERY REMAINING LONGEVITY: 93 mo
MDC IDC MSMT LEADCHNL RA IMPEDANCE VALUE: 400 Ohm
MDC IDC MSMT LEADCHNL RA PACING THRESHOLD AMPLITUDE: 0.75 V
MDC IDC MSMT LEADCHNL RA PACING THRESHOLD PULSEWIDTH: 0.4 ms
MDC IDC MSMT LEADCHNL RV PACING THRESHOLD AMPLITUDE: 1 V
MDC IDC MSMT LEADCHNL RV PACING THRESHOLD PULSEWIDTH: 0.4 ms
MDC IDC PG IMPLANT DT: 20140115
MDC IDC SET LEADCHNL RV PACING AMPLITUDE: 2.5 V
MDC IDC SET LEADCHNL RV SENSING SENSITIVITY: 2 mV
Pulse Gen Model: 2210
Pulse Gen Serial Number: 7439597

## 2017-11-10 NOTE — Progress Notes (Signed)
HPI Clarence Dawson returns today for ongoing evaluation and management of sinus node dysfunction, PAF, s/p PPM insertion. Since his last visit a year ago, he has had knee surgery. He got into poison ivy and took topical steroids. He has had minimal symptomatic atrial fib. Allergies  Allergen Reactions  . Fish Allergy Anaphylaxis and Shortness Of Breath    SEAFOOD  . Food Anaphylaxis and Shortness Of Breath    TREE NUTS  . Iodinated Diagnostic Agents Hives and Shortness Of Breath    Patient states hives to throat closing. (01/15/17: patient states this reaction was "about 20 years ago" with possibly an IVP.  He now says high doses of prednisone "throw me into AFib."  He has tolerated CT arthrograms with Benadrly 50mg  PO one hour before injection.  Brita Romp, RN)  . Shellfish Allergy Anaphylaxis    To shellfish, crabs.  Makes him feel like "things are crawling all over" me.  Denies airway issues with these foods.  Brita Romp, RN 01/15/17)  . Goat-Derived Products Hives    GOAT CHEESE GOATS  . Prednisone Palpitations    PRECIPITATES A-FIB  . Clavulanic Acid Diarrhea  . Voltaren [Diclofenac Sodium] Other (See Comments)    Feels like things are crawling on him     Current Outpatient Medications  Medication Sig Dispense Refill  . acetaminophen (TYLENOL) 500 MG tablet Take 1,000 mg by mouth 3 (three) times daily.     Marland Kitchen albuterol (ACCUNEB) 0.63 MG/3ML nebulizer solution Take 3 mLs (0.63 mg total) by nebulization every 4 (four) hours as needed for wheezing. 75 mL 1  . albuterol (PROVENTIL HFA;VENTOLIN HFA) 108 (90 Base) MCG/ACT inhaler Inhale 2 puffs every 4 (four) hours as needed into the lungs for shortness of breath (only if you can't catch your breath).    Marland Kitchen amLODipine (NORVASC) 10 MG tablet Take 10 mg by mouth daily.      . bisoprolol (ZEBETA) 5 MG tablet TAKE 1 AND 1/2 TABLET BY MOUTH TWICE DAILY. 90 tablet 3  . budesonide-formoterol (SYMBICORT) 160-4.5 MCG/ACT inhaler Take 2 puffs  first thing in am and then another 2 puffs about 12 hours later. 1 Inhaler 11  . canagliflozin (INVOKANA) 300 MG TABS tablet Take 300 mg by mouth daily before breakfast.    . cloNIDine (CATAPRES) 0.1 MG tablet Take 0.1 mg by mouth 2 (two) times daily.    Marland Kitchen glipiZIDE (GLUCOTROL XL) 5 MG 24 hr tablet Take 5 mg by mouth daily before breakfast.    . levothyroxine (SYNTHROID, LEVOTHROID) 150 MCG tablet Take 150 mcg by mouth daily before breakfast.     . losartan-hydrochlorothiazide (HYZAAR) 100-25 MG per tablet Take 1 tablet by mouth daily.    . metFORMIN (GLUCOPHAGE-XR) 500 MG 24 hr tablet Take 500 mg by mouth at bedtime.    . Multiple Vitamin (MULTIVITAMIN) tablet Take 1 tablet by mouth daily.      Marland Kitchen omeprazole (PRILOSEC OTC) 20 MG tablet Take 20 mg by mouth daily as needed.    . rivaroxaban (XARELTO) 20 MG TABS tablet Take 20 mg daily with supper by mouth.    . simvastatin (ZOCOR) 20 MG tablet Take 20 mg by mouth at bedtime.     No current facility-administered medications for this visit.      Past Medical History:  Diagnosis Date  . Allergic rhinitis   . Aortic valve disorder   . Asthma    since childhood- seasonal allergies induced  . Cancer ([place])  Skin cancer- squamous, basal  . Carotid artery stenosis   . Essential hypertension   . Full dentures   . GERD (gastroesophageal reflux disease)   . H/O hiatal hernia   . Hemorrhage of rectum   . Hyperlipidemia   . Hypothyroidism   . 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 OSA (obstructive sleep apnea)   . Osteoarthritis   . Pacemaker    Oct 2005 in [place].  Marland Kitchen PAF (paroxysmal atrial fibrillation) ([place])   . Pneumonia    "several Times" 2015 last time  . Primary localized osteoarthritis of right knee 08/11/2017  . RBBB (right bundle branch block)   . Sinoatrial node dysfunction (HCC)   . Syncope   . Tricuspid valve disorder   . Type 2 diabetes mellitus (HCC)    Type II    ROS:   All systems reviewed and negative except as noted in the  HPI.   Past Surgical History:  Procedure Laterality Date  . A-V CARDIAC PACEMAKER INSERTION     Sick sinus syndrome DDR pacer  . Arthropathy Right 2005   Rebuilding of left thumb and joint   . CARDIAC CATHETERIZATION    . CARDIAC ELECTROPHYSIOLOGY STUDY AND ABLATION  09/2008   for pvcs, Dr. Loralie Champagne  . CARPAL TUNNEL RELEASE  1994   right wrist  . CHOLECYSTECTOMY  1994  . CIRCUMCISION    . 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 EYE SURGERY  2012   Left eye Corneal transplant- partial- Cataract  . GALLBLADDER SURGERY  12/01/2006  . HAND TENDON SURGERY Left late 1990's   thumb  . HEMORROIDECTOMY  2003  . left Knee Arthroscopy     [facility]- [facility]  . Rotator cuff Surgery  2001   Right shoulder  . TONSILLECTOMY       Family History  Problem Relation Age of Onset  . Other Father 46       Sudden Cardiac death  . Pancreatic cancer Mother   . Colon cancer Mother   . Colon cancer Maternal Aunt   . Colon polyps Neg Hx      Social History   Socioeconomic History  . Marital status: Married    Spouse name: Not on file  . Number of children: Not on file  . Years of education: Not on file  . Highest education level: Not on file  Social Needs  . Financial resource strain: Not on file  . Food insecurity - worry: Not on file  . Food insecurity - inability: Not on file  . Transportation needs - medical: Not on file  . Transportation needs - non-medical: Not on file  Occupational History  . Not on file  Tobacco Use  . Smoking status: Former Smoker    Packs/day: 1.00    Years: 25.00    Pack years: 25.00    Types: Cigarettes    Start date: 02/20/1958    Last attempt to quit: 02/12/1984    Years since quitting: 33.7  . Smokeless tobacco: Never Used  Substance and Sexual Activity  . Alcohol use: Yes    Alcohol/week: 2.4 oz    Types: 4 Glasses of wine per week  . Drug use: No  . Sexual activity: Not on file  Other Topics Concern   . Not on file  Social History Narrative   Regular exercise: No     BP 122/78   Pulse 73   Ht 5\' 10"  (1.778 m)  Wt 229 lb (103.9 kg)   SpO2 93%   BMI 32.86 kg/m   Physical Exam:  Well appearing NAD HEENT: Unremarkable Neck:  No JVD, no thyromegally Lymphatics:  No adenopathy Back:  No CVA tenderness Lungs:  Clear HEART:  Regular rate rhythm, no murmurs, no rubs, no clicks Abd:  soft, positive bowel sounds, no organomegally, no rebound, no guarding Ext:  2 plus pulses, no edema, no cyanosis, no clubbing Skin:  No rashes no nodules Neuro:  CN II through XII intact, motor grossly intact  DEVICE  Normal device function.  See PaceArt for details.   Assess/Plan: 1. PAF - he is maintaining NSR 95% of the time. He will continue his current meds. 2. Sinus node dysfunction - he is asymptomatic s/p PPM insertion and is pacing 98% of the time. 3. PPM - his [employer] DDD PM is working normally. Will recheck in several months.  Clarence Dawson.D.

## 2017-11-10 NOTE — Patient Instructions (Signed)
Medication Instructions:  Your physician recommends that you continue on your current medications as directed. Please refer to the Current Medication list given to you today.  Labwork: None ordered.  Testing/Procedures: None ordered.  Follow-Up: Your physician wants you to follow-up in: one year with Dr. Lovena Le.   You will receive a reminder letter in the mail two months in advance. If you don't receive a letter, please call our office to schedule the follow-up appointment.  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 This monitoring reduces the number of office visits required to check your device to one time per year. It allows [place] to keep an eye on the functioning of your device to ensure it is working properly. You are scheduled for a device check from home on 02/01/2017. You may send your transmission at any time that day. If you have a wireless device, the transmission will be sent automatically. After your physician reviews your transmission, you will receive a postcard with your next transmission date.    Any Other Special Instructions Will Be Listed Below (If Applicable).     If you need a refill on your cardiac medications before your next appointment, please call your pharmacy.

## 2017-11-17 DIAGNOSIS — E119 Type 2 diabetes mellitus without complications: Secondary | ICD-10-CM | POA: Diagnosis not present

## 2017-11-17 DIAGNOSIS — E039 Hypothyroidism, unspecified: Secondary | ICD-10-CM | POA: Diagnosis not present

## 2017-11-17 DIAGNOSIS — Z79899 Other long term (current) drug therapy: Secondary | ICD-10-CM | POA: Diagnosis not present

## 2017-11-17 LAB — CUP PACEART REMOTE DEVICE CHECK
Battery Remaining Longevity: 97 mo
Brady Statistic AP VP Percent: 98 %
Brady Statistic AP VS Percent: 1 %
Brady Statistic AS VP Percent: 1 %
Brady Statistic RA Percent Paced: 98 %
Brady Statistic RV Percent Paced: 98 %
Date Time Interrogation Session: 20181105191518
Implantable Lead Implant Date: 20051106
Implantable Lead Location: 753859
Lead Channel Impedance Value: 680 Ohm
Lead Channel Pacing Threshold Amplitude: 0.75 V
Lead Channel Pacing Threshold Amplitude: 0.75 V
Lead Channel Sensing Intrinsic Amplitude: 11.5 mV
Lead Channel Sensing Intrinsic Amplitude: 2 mV
Lead Channel Setting Pacing Amplitude: 2.5 V
Lead Channel Setting Sensing Sensitivity: 2 mV
MDC IDC LEAD IMPLANT DT: 20051106
MDC IDC LEAD LOCATION: 753860
MDC IDC MSMT BATTERY REMAINING PERCENTAGE: 95.5 %
MDC IDC MSMT BATTERY VOLTAGE: 2.96 V
MDC IDC MSMT LEADCHNL RA IMPEDANCE VALUE: 390 Ohm
MDC IDC MSMT LEADCHNL RA PACING THRESHOLD PULSEWIDTH: 0.4 ms
MDC IDC MSMT LEADCHNL RV PACING THRESHOLD PULSEWIDTH: 0.4 ms
MDC IDC PG IMPLANT DT: 20140115
MDC IDC PG SERIAL: 7439597
MDC IDC SET LEADCHNL RA PACING AMPLITUDE: 2 V
MDC IDC SET LEADCHNL RV PACING PULSEWIDTH: 0.4 ms
MDC IDC STAT BRADY AS VS PERCENT: 1.3 %

## 2017-11-24 DIAGNOSIS — J019 Acute sinusitis, unspecified: Secondary | ICD-10-CM | POA: Diagnosis not present

## 2017-11-24 DIAGNOSIS — E039 Hypothyroidism, unspecified: Secondary | ICD-10-CM | POA: Diagnosis not present

## 2017-11-24 DIAGNOSIS — I480 Paroxysmal atrial fibrillation: Secondary | ICD-10-CM | POA: Diagnosis not present

## 2017-11-24 DIAGNOSIS — E119 Type 2 diabetes mellitus without complications: Secondary | ICD-10-CM | POA: Diagnosis not present

## 2017-12-17 DIAGNOSIS — D225 Melanocytic nevi of trunk: Secondary | ICD-10-CM | POA: Diagnosis not present

## 2017-12-17 DIAGNOSIS — L570 Actinic keratosis: Secondary | ICD-10-CM | POA: Diagnosis not present

## 2017-12-17 DIAGNOSIS — L820 Inflamed seborrheic keratosis: Secondary | ICD-10-CM | POA: Diagnosis not present

## 2017-12-17 DIAGNOSIS — X32XXXD Exposure to sunlight, subsequent encounter: Secondary | ICD-10-CM | POA: Diagnosis not present

## 2017-12-17 DIAGNOSIS — L821 Other seborrheic keratosis: Secondary | ICD-10-CM | POA: Diagnosis not present

## 2018-01-19 ENCOUNTER — Ambulatory Visit: Payer: Medicare Other | Admitting: Adult Health

## 2018-01-19 ENCOUNTER — Ambulatory Visit (INDEPENDENT_AMBULATORY_CARE_PROVIDER_SITE_OTHER)
Admission: RE | Admit: 2018-01-19 | Discharge: 2018-01-19 | Disposition: A | Discharge status: Home / Self Care | Payer: Medicare Other | Source: Ambulatory Visit | Attending: Adult Health | Admitting: Adult Health

## 2018-01-19 ENCOUNTER — Other Ambulatory Visit (INDEPENDENT_AMBULATORY_CARE_PROVIDER_SITE_OTHER): Payer: Medicare Other

## 2018-01-19 ENCOUNTER — Encounter: Payer: Self-pay | Admitting: Adult Health

## 2018-01-19 VITALS — BP 128/70 | HR 103 | Ht 70.0 in | Wt 226.0 lb

## 2018-01-19 DIAGNOSIS — J45991 Cough variant asthma: Secondary | ICD-10-CM

## 2018-01-19 DIAGNOSIS — R058 Other specified cough: Secondary | ICD-10-CM

## 2018-01-19 DIAGNOSIS — R05 Cough: Secondary | ICD-10-CM | POA: Diagnosis not present

## 2018-01-19 DIAGNOSIS — J4531 Mild persistent asthma with (acute) exacerbation: Secondary | ICD-10-CM

## 2018-01-19 LAB — CBC WITH DIFFERENTIAL/PLATELET
BASOS ABS: 0 10*3/uL (ref 0.0–0.1)
BASOS PCT: 0.6 % (ref 0.0–3.0)
EOS ABS: 0.2 10*3/uL (ref 0.0–0.7)
Eosinophils Relative: 2.9 % (ref 0.0–5.0)
HEMATOCRIT: 51.2 % (ref 39.0–52.0)
HEMOGLOBIN: 17.4 g/dL — AB (ref 13.0–17.0)
LYMPHS PCT: 25.5 % (ref 12.0–46.0)
Lymphs Abs: 1.7 10*3/uL (ref 0.7–4.0)
MCHC: 34.1 g/dL (ref 30.0–36.0)
MCV: 90.7 fl (ref 78.0–100.0)
MONO ABS: 0.8 10*3/uL (ref 0.1–1.0)
Monocytes Relative: 11.7 % (ref 3.0–12.0)
Neutro Abs: 4 10*3/uL (ref 1.4–7.7)
Neutrophils Relative %: 59.3 % (ref 43.0–77.0)
Platelets: 182 10*3/uL (ref 150.0–400.0)
RBC: 5.65 Mil/uL (ref 4.22–5.81)
RDW: 13.6 % (ref 11.5–15.5)
WBC: 6.8 10*3/uL (ref 4.0–10.5)

## 2018-01-19 LAB — BASIC METABOLIC PANEL
BUN: 17 mg/dL (ref 6–23)
CHLORIDE: 101 meq/L (ref 96–112)
CO2: 31 mEq/L (ref 19–32)
CREATININE: 1.03 mg/dL (ref 0.40–1.50)
Calcium: 9.7 mg/dL (ref 8.4–10.5)
GFR: 75.47 mL/min (ref >60.00)
Glucose, Bld: 112 mg/dL — ABNORMAL HIGH (ref 70–99)
POTASSIUM: 4.1 meq/L (ref 3.5–5.1)
Sodium: 139 mEq/L (ref 135–145)

## 2018-01-19 LAB — BRAIN NATRIURETIC PEPTIDE: PRO B NATRI PEPTIDE: 95 pg/mL (ref 0.0–100.0)

## 2018-01-19 LAB — NITRIC OXIDE: Nitric Oxide: 28

## 2018-01-19 MED ORDER — BENZONATATE 200 MG PO CAPS: 200.0000 mg | ORAL_CAPSULE | Freq: Three times a day (TID) | ORAL | 3 refills | Status: DC | PRN

## 2018-01-19 NOTE — Patient Instructions (Addendum)
Begin Zyrtec 10 mg bedtime .  Mucinex twice Twice daily  As needed  Cough/congestion  Begin Delsym Twice a day as needed for cough Begin Pepcid 20 mg at At bedtime .  Chest x-ray and labs today .  Sips of water to soothe throat and avoid coughing . NO MINTS .  Tessalon Three times a day  As needed  Cough.  follow up Dr. Melvyn Novas  In 4 weeks and As needed   Please contact office for sooner follow up if symptoms do not improve or worsen or seek emergency care

## 2018-01-19 NOTE — Assessment & Plan Note (Signed)
Flare  Control for triggers  Check CXR /labs w/ bnp   Plan  Patient Instructions  Begin Zyrtec 10 mg bedtime .  Mucinex twice Twice daily  As needed  Cough/congestion  Begin Delsym Twice a day as needed for cough Begin Pepcid 20 mg at At bedtime .  Chest x-ray and labs today .  Sips of water to soothe throat and avoid coughing . NO MINTS .  Tessalon Three times a day  As needed  Cough.  follow up Dr. Melvyn Novas  In 4 weeks and As needed   Please contact office for sooner follow up if symptoms do not improve or worsen or seek emergency care

## 2018-01-19 NOTE — Assessment & Plan Note (Signed)
Flare +.- UACS  Check CXR and labs  Control for triggers .   Plan  Patient Instructions  Begin Zyrtec 10 mg bedtime .  Mucinex twice Twice daily  As needed  Cough/congestion  Begin Delsym Twice a day as needed for cough Begin Pepcid 20 mg at At bedtime .  Chest x-ray and labs today .  Sips of water to soothe throat and avoid coughing . NO MINTS .  Tessalon Three times a day  As needed  Cough.  follow up Dr. Melvyn Novas  In 4 weeks and As needed   Please contact office for sooner follow up if symptoms do not improve or worsen or seek emergency care

## 2018-01-19 NOTE — Progress Notes (Signed)
Chart and office note reviewed in detail  > agree with a/p as outlined    

## 2018-01-19 NOTE — Progress Notes (Signed)
@Patient  ID: Clarence Dawson, male    DOB: 08-03-1946, 72 y.o.   MRN: [id number]  Chief Complaint  Patient presents with  . Acute Visit    Cough     Referring provider: [name], MD  HPI: 72 year old male former smoker, quit 1985, followed for cough variant asthma    01/19/2018 Acute OV : Cough  Patient presents for an acute office visit.  Patient was last seen in the office in 2017 for a chronic cough.  He was treated for cough variant asthma.  And medications were changed from Advair to Symbicort and Lopressor to bisoprolol.  Patient says he did get better up until about 3-4 months ago.  His cough returned and is been intermittently congested with initial blood-tinged mucus and now clear mucus.  He has sinus drainage and postnasal drip throat clearing.  No overt reflux.  Is on Prilosec daily.  Patient was treated initially with 2 courses of antibiotics.  But says he had minimal improvement in symptoms.  Spirometry shows mild airflow obstruction with an FEV1 at 78%, ratio 61, FVC 94%..Exhaled nitric oxide testing was minimally elevated at 28.  Patient is on Symbicort twice daily.  He denies any missed doses.  No albuterol use.  He denies any significant wheezing or shortness of breath no orthopnea or edema. Patient does have A. fib and diastolic dysfunction.  He is on Xarelto.  Allergies  Allergen Reactions  . Fish Allergy Anaphylaxis and Shortness Of Breath    SEAFOOD  . Food Anaphylaxis and Shortness Of Breath    TREE NUTS  . Iodinated Diagnostic Agents Hives and Shortness Of Breath    Patient states hives to throat closing. (01/15/17: patient states this reaction was "about 20 years ago" with possibly an IVP.  He now says high doses of prednisone "throw me into AFib."  He has tolerated CT arthrograms with Benadrly 50mg  PO one hour before injection.  Brita Romp, RN)  . Shellfish Allergy Anaphylaxis    To shellfish, crabs.  Makes him feel like "things are crawling all over" me.   Denies airway issues with these foods.  Brita Romp, RN 01/15/17)  . Goat-Derived Products Hives    GOAT CHEESE GOATS  . Prednisone Palpitations    PRECIPITATES A-FIB  . Clavulanic Acid Diarrhea  . Voltaren [Diclofenac Sodium] Other (See Comments)    Feels like things are crawling on him    Immunization History  Administered Date(s) Administered  . Influenza Split 11/14/2015  . Influenza, High Dose Seasonal PF 09/28/2017  . Influenza-Unspecified 11/02/2016    Past Medical History:  Diagnosis Date  . Allergic rhinitis   . Aortic valve disorder   . Asthma    since childhood- seasonal allergies induced  . Cancer ([place])    Skin cancer- squamous, basal  . Carotid artery stenosis   . Essential hypertension   . Full dentures   . GERD (gastroesophageal reflux disease)   . H/O hiatal hernia   . Hemorrhage of rectum   . Hyperlipidemia   . Hypothyroidism   . 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 OSA (obstructive sleep apnea)   . Osteoarthritis   . Pacemaker    Oct 2005 in [place].  Marland Kitchen PAF (paroxysmal atrial fibrillation) ([place])   . Pneumonia    "several Times" 2015 last time  . Primary localized osteoarthritis of right knee 08/11/2017  . RBBB (right bundle branch block)   . Sinoatrial node dysfunction (HCC)   . Syncope   . Tricuspid  valve disorder   . Type 2 diabetes mellitus (HCC)    Type II    Tobacco History: Social History   Tobacco Use  Smoking Status Former Smoker  . Packs/day: 1.00  . Years: 25.00  . Pack years: 25.00  . Types: Cigarettes  . Start date: 02/20/1958  . Last attempt to quit: 02/12/1984  . Years since quitting: 33.9  Smokeless Tobacco Never Used   Counseling given: Not Answered   Outpatient Encounter Medications as of 01/19/2018  Medication Sig  . acetaminophen (TYLENOL) 500 MG tablet Take 1,000 mg by mouth 3 (three) times daily.   Marland Kitchen albuterol (ACCUNEB) 0.63 MG/3ML nebulizer solution Take 3 mLs (0.63 mg total) by nebulization every 4 (four) hours as needed for  wheezing.  Marland Kitchen albuterol (PROVENTIL HFA;VENTOLIN HFA) 108 (90 Base) MCG/ACT inhaler Inhale 2 puffs every 4 (four) hours as needed into the lungs for shortness of breath (only if you can't catch your breath).  Marland Kitchen amLODipine (NORVASC) 10 MG tablet Take 10 mg by mouth daily.    . bisoprolol (ZEBETA) 5 MG tablet TAKE 1 AND 1/2 TABLET BY MOUTH TWICE DAILY.  . budesonide-formoterol (SYMBICORT) 160-4.5 MCG/ACT inhaler Take 2 puffs first thing in am and then another 2 puffs about 12 hours later.  . canagliflozin (INVOKANA) 300 MG TABS tablet Take 300 mg by mouth daily before breakfast.  . cloNIDine (CATAPRES) 0.1 MG tablet Take 0.1 mg by mouth 2 (two) times daily.  Marland Kitchen glipiZIDE (GLUCOTROL XL) 5 MG 24 hr tablet Take 5 mg by mouth daily before breakfast.  . levothyroxine (SYNTHROID, LEVOTHROID) 150 MCG tablet Take 150 mcg by mouth daily before breakfast.   . losartan-hydrochlorothiazide (HYZAAR) 100-25 MG per tablet Take 1 tablet by mouth daily.  . metFORMIN (GLUCOPHAGE-XR) 500 MG 24 hr tablet Take 500 mg by mouth at bedtime.  . Multiple Vitamin (MULTIVITAMIN) tablet Take 1 tablet by mouth daily.    Marland Kitchen omeprazole (PRILOSEC OTC) 20 MG tablet Take 20 mg by mouth daily as needed.  . rivaroxaban (XARELTO) 20 MG TABS tablet Take 20 mg daily with supper by mouth.  . simvastatin (ZOCOR) 20 MG tablet Take 20 mg by mouth at bedtime.  . benzonatate (TESSALON) 200 MG capsule Take 1 capsule (200 mg total) by mouth 3 (three) times daily as needed for cough.   No facility-administered encounter medications on file as of 01/19/2018.      Review of Systems  Constitutional:   No  weight loss, night sweats,  Fevers, chills, fatigue, or  lassitude.  HEENT:   No headaches,  Difficulty swallowing,  Tooth/dental problems, or  Sore throat,                No sneezing, itching, ear ache,  +nasal congestion, post nasal drip,   CV:  No chest pain,  Orthopnea, PND, swelling in lower extremities, anasarca, dizziness, palpitations,  syncope.   GI  No heartburn, indigestion, abdominal pain, nausea, vomiting, diarrhea, change in bowel habits, loss of appetite, bloody stools.   Resp:   No chest wall deformity  Skin: no rash or lesions.  GU: no dysuria, change in color of urine, no urgency or frequency.  No flank pain, no hematuria   MS:  No joint pain or swelling.  No decreased range of motion.  No back pain.    Physical Exam  BP 128/70   Pulse (!) 103   Ht 5\' 10"  (1.778 m)   Wt 226 lb (102.5 kg)   SpO2 93%  BMI 32.43 kg/m   GEN: A/Ox3; pleasant , NAD    HEENT:  [place]/AT,  EACs-clear, TMs-wnl, NOSE-clear drainage , THROAT-clear, no lesions, no postnasal drip or exudate noted.   NECK:  Supple w/ fair ROM; no JVD; normal carotid impulses w/o bruits; no thyromegaly or nodules palpated; no lymphadenopathy.    RESP  Clear  P & A; w/o, wheezes/ rales/ or rhonchi. no accessory muscle use, no dullness to percussion  CARD:  RRR, no m/r/g, no peripheral edema, pulses intact, no cyanosis or clubbing.  GI:   Soft & nt; nml bowel sounds; no organomegaly or masses detected.   Musco: Warm bil, no deformities or joint swelling noted.   Neuro: alert, no focal deficits noted.    Skin: Warm, no lesions or rashes    Lab Results:  CBC  BMET  BNP No results found for: BNP  ProBNP No results found for: PROBNP  Imaging: Dg Chest 2 View  Result Date: 01/19/2018 CLINICAL DATA:  Cough for 4 months, history of asthma EXAM: CHEST  2 VIEW COMPARISON:  None. FINDINGS: LEFT-sided pacemaker overlies normal cardiac silhouette. No effusion, infiltrate pneumothorax. Lungs are hyperinflated. Degenerative osteophytosis of the spine. IMPRESSION: No acute findings.  Hyperinflated lungs. Electronically Signed   By: Suzy Bouchard M.D.   On: 01/19/2018 10:25     Assessment & Plan:   Asthma with acute exacerbation Flare +.- UACS  Check CXR and labs  Control for triggers .   Plan  Patient Instructions  Begin Zyrtec 10  mg bedtime .  Mucinex twice Twice daily  As needed  Cough/congestion  Begin Delsym Twice a day as needed for cough Begin Pepcid 20 mg at At bedtime .  Chest x-ray and labs today .  Sips of water to soothe throat and avoid coughing . NO MINTS .  Tessalon Three times a day  As needed  Cough.  follow up Dr. Melvyn Novas  In 4 weeks and As needed   Please contact office for sooner follow up if symptoms do not improve or worsen or seek emergency care       Upper airway cough syndrome Flare  Control for triggers  Check CXR /labs w/ bnp   Plan  Patient Instructions  Begin Zyrtec 10 mg bedtime .  Mucinex twice Twice daily  As needed  Cough/congestion  Begin Delsym Twice a day as needed for cough Begin Pepcid 20 mg at At bedtime .  Chest x-ray and labs today .  Sips of water to soothe throat and avoid coughing . NO MINTS .  Tessalon Three times a day  As needed  Cough.  follow up Dr. Melvyn Novas  In 4 weeks and As needed   Please contact office for sooner follow up if symptoms do not improve or worsen or seek emergency care          Rexene Edison, NP 01/19/2018

## 2018-01-19 NOTE — Progress Notes (Signed)
72 y/o M with PMH: OSA, Asthma, PAF s/p PPM, HTN. Presents today with complaints of ongoing cough for the past 3-4 months.  In the beginning he was coughing up some bood tinged sputum, now the sputum is more clear yellow and at times coffee colored.  Per patient the cough is annoying and he is coughing all day and at times during the night. Does not associate the cough with any other symptoms.  In the beginning he took OTC cough suppressant (Nyquil) with no relief.  He does take benadryl at night to help with sleep.  He saw Dr. [name] few months back for the cough, and he took 2 rounds of antibiotics. Endorses taking his Symbicort as scheduled and has had no need to take his PRN albuterol. Denies fever, SOB, chest pain or heat burn.  He is a former smoker that quit in 1985.  He has lost 20lbs over the past two years.  States he has been heating healthy and trying to loose weight with the help of his wife.

## 2018-01-20 ENCOUNTER — Telehealth: Payer: Self-pay | Admitting: Adult Health

## 2018-01-20 NOTE — Telephone Encounter (Signed)
Blood work is okay, nothing to explain ongoing sx  CHF marker is ok.  No anemia  Cont with ov recs Please contact office for sooner follow up if symptoms do not improve or worsen or seek emergency care   CXR is ok Cont w/ ov recs Please contact office for sooner follow up if symptoms do not improve or worsen or seek emergency care   Spoke with patient about his blood work and CXR results. He verbalized understanding. Nothing else needed at time of call.

## 2018-02-01 ENCOUNTER — Ambulatory Visit (INDEPENDENT_AMBULATORY_CARE_PROVIDER_SITE_OTHER): Payer: Medicare Other | Admitting: *Deleted

## 2018-02-01 DIAGNOSIS — I495 Sick sinus syndrome: Secondary | ICD-10-CM

## 2018-02-01 NOTE — Progress Notes (Signed)
Remote pacemaker transmission.   

## 2018-02-02 ENCOUNTER — Encounter: Payer: Self-pay | Admitting: Cardiology

## 2018-02-16 ENCOUNTER — Ambulatory Visit: Payer: Medicare Other | Admitting: Internal Medicine

## 2018-02-16 ENCOUNTER — Encounter: Payer: Self-pay | Admitting: Internal Medicine

## 2018-02-16 VITALS — BP 116/72 | HR 115 | Ht 70.0 in | Wt 229.4 lb

## 2018-02-16 DIAGNOSIS — J45991 Cough variant asthma: Secondary | ICD-10-CM

## 2018-02-16 DIAGNOSIS — R05 Cough: Secondary | ICD-10-CM | POA: Diagnosis not present

## 2018-02-16 DIAGNOSIS — R058 Other specified cough: Secondary | ICD-10-CM

## 2018-02-16 LAB — CUP PACEART REMOTE DEVICE CHECK
Battery Remaining Longevity: 89 mo
Battery Voltage: 2.95 V
Brady Statistic AP VP Percent: 96 %
Brady Statistic AS VP Percent: 1.7 %
Brady Statistic AS VS Percent: 1.9 %
Brady Statistic RA Percent Paced: 96 %
Brady Statistic RV Percent Paced: 97 %
Date Time Interrogation Session: 20190204080042
Implantable Lead Implant Date: 20051106
Implantable Lead Location: 753859
Implantable Pulse Generator Implant Date: 20140115
Lead Channel Impedance Value: 660 Ohm
Lead Channel Pacing Threshold Amplitude: 0.75 V
Lead Channel Pacing Threshold Amplitude: 1 V
Lead Channel Pacing Threshold Pulse Width: 0.4 ms
Lead Channel Pacing Threshold Pulse Width: 0.4 ms
Lead Channel Sensing Intrinsic Amplitude: 12 mV
Lead Channel Setting Pacing Amplitude: 2 V
Lead Channel Setting Pacing Pulse Width: 0.4 ms
Lead Channel Setting Sensing Sensitivity: 2 mV
MDC IDC LEAD IMPLANT DT: 20051106
MDC IDC LEAD LOCATION: 753860
MDC IDC MSMT BATTERY REMAINING PERCENTAGE: 91 %
MDC IDC MSMT LEADCHNL RA IMPEDANCE VALUE: 360 Ohm
MDC IDC MSMT LEADCHNL RA SENSING INTR AMPL: 2 mV
MDC IDC PG SERIAL: 7439597
MDC IDC SET LEADCHNL RV PACING AMPLITUDE: 2.5 V
MDC IDC STAT BRADY AP VS PERCENT: 1 %

## 2018-02-16 LAB — NITRIC OXIDE: Nitric Oxide: 57

## 2018-02-16 MED ORDER — TRAMADOL HCL 50 MG PO TABS: ORAL_TABLET | ORAL | 0 refills | Status: DC

## 2018-02-16 MED ORDER — BUDESONIDE-FORMOTEROL FUMARATE 160-4.5 MCG/ACT IN AERO: INHALATION_SPRAY | RESPIRATORY_TRACT | 11 refills | Status: DC

## 2018-02-16 MED ORDER — MONTELUKAST SODIUM 10 MG PO TABS
ORAL_TABLET | ORAL | 2 refills | Status: DC
Start: 2018-02-16 — End: 2018-05-04

## 2018-02-16 NOTE — Patient Instructions (Addendum)
Change prilosec to 40 mg Take 30- 60 min before your first and last meals of the day and continue pepcid at bedtime  GERD (REFLUX)  is an extremely common cause of respiratory symptoms just like yours , many times with no obvious heartburn at all.    It can be treated with medication, but also with lifestyle changes including elevation of the head of your bed (ideally with 6 inch  bed blocks),  Smoking cessation, avoidance of late meals, excessive alcohol, and avoid fatty foods, chocolate, peppermint, colas, red wine, and acidic juices such as orange juice.  NO MINT OR MENTHOL PRODUCTS SO NO COUGH DROPS  USE SUGARLESS CANDY INSTEAD (Jolley ranchers or Stover's or Life Savers) or even ice chips will also do - the key is to swallow to prevent all throat clearing. NO OIL BASED VITAMINS - use powdered substitutes.    singulair 10 mg at bedtime   For drainage / throat tickle try take CHLORPHENIRAMINE  4 mg - take one every 4 hours as needed - available over the counter- may cause drowsiness so start with just a bedtime dose or two and see how you tolerate it before trying in daytime      Take delsym two tsp every 12 hours and supplement if needed with  tramadol 50 mg up to 2 every 4 hours to suppress the urge to cough. Swallowing water or using ice chips/non mint and menthol containing candies (such as lifesavers or sugarless jolly ranchers) are also effective.  You should rest your voice and avoid activities that you know make you cough.  Once you have eliminated the cough for 3 straight days try reducing the tramadol first,  then the delsym as tolerated.   Please schedule a follow up office visit in 2  weeks, sooner if needed  with all medications /inhalers/ solutions in hand so we can verify exactly what you are taking. This includes all medications from all doctors and over the counters

## 2018-02-16 NOTE — Progress Notes (Signed)
Subjective:    Patient ID: Clarence Dawson, male    DOB: 07-Oct-1946,     MRN: [id number]    Brief patient profile:  35 yowm quit smoking 1985 onset of breathing problems mid 2000s rx advair and helped a lot until around spring 2016 with cough varaiably productive not responding to abx x 2 / antiviral rx and referred to pulmonary clinic 01/24/2016 by Clarence Dawson for refractory cough.   Brief patient profile:  01/24/2016 1st [facility] office visit/ Clarence Dawson  maint rx = advair 250 Chief Complaint  Patient presents with  . PULMONARY CONSULT    Referred by Clarence Dawson. Pt c/o cough that is sometimes productive with white-brown thick mucus and some wheeze. Pt denies SOB/CP/tightness. Pt does have history of asthma since childhood. Pt is not on maintenance inhalers.   cough is worse before supper until 10 pm goes to sleep and does fine and then recurs p stirs each am  Not limited by breathing from desired activities   rec Stop lopressor and replace with bystolic 10 mg twice daily  Stop advair > Prednisone 10 mg take  4 each am x 2 days,   2 each am x 2 days,  1 each am x 2 days and stop  Pantoprazole (protonix) 40 mg   Take  30-60 min before first meal of the day and Pepcid (famotidine)  20 mg one @  bedtime until return to office - this is the best way to tell whether stomach acid is contributing to your problem.   GERD diet   06/23/2016  f/u ov/Clarence Dawson re:  Recurrent cough x one year Chief Complaint  Patient presents with  . Follow-up    Pt states that his cough is sometimes better and sometimes worse. Cough is prod with thick, light brown sputum.   cough is highly variable symbiocrt 80 2 every 12 hours / prn proventil  Tends to be worse p stirring in am for first hours and one hour before hs  Always better when has been rx'd with prednisone / ? Better on 160 vs 80  rec Change symbicort to 160 Take 2 puffs first thing in am and then another 2 puffs about 12 hours later.  If not better  Prednisone 10 mg take  4 each am x 2 days,   2 each am x 2 days,  1 each am x 2 days and stop  Please remember to go to the  x-ray department downstairs for your tests - we will call you with the results when they are available.    01/19/18 acute ov for cough / NP  eval -  already tried augmentin x 2 no better  rec Begin Zyrtec 10 mg bedtime .  Mucinex twice Twice daily  As needed  Cough/congestion  Begin Delsym Twice a day as needed for cough Begin Pepcid 20 mg at At bedtime .  Sips of water to soothe throat and avoid coughing . NO MINTS .  Tessalon Three times a day  As needed  Cough.    02/16/2018  Extended f/u ov/Clarence Dawson re:  Re-estatablish re Refractory cough x Nov 2018  Chief Complaint  Patient presents with  . Follow-up    Cough has "probably been worse" since the last visit. Cough is esp worse at night and is waking him up.  He occ produces some white to brown sputum.    onset late November 2018 tickle in throat with minimal mucus variably discolored  worse at hs no better with abx  Severe to point of gagging but no vomiting Dyspnea:  Fine as long as not coughing Sleep: helps to sit up / normally flat at hs but sleeping > 45 degrees now Never got steroids this time "they cause a fib "  Overt hb on otc ppi prn and lots of tums Not using saba at all    No obvious day to day or daytime variability or assoc   mucus plugs or hemoptysis or cp or chest tightness, subjective wheeze,  No unusual exposure hx or h/o childhood pna/ asthma or knowledge of premature birth.    Also denies any obvious fluctuation of symptoms with weather or environmental changes or other aggravating or alleviating factors except as outlined above   Current Allergies, Complete Past Medical History, Past Surgical History, Family History, and Social History were reviewed in [employer] record.  ROS  The following are not active complaints unless bolded Hoarseness, sore throat, dysphagia,  dental problems, itching, sneezing,  nasal congestion or discharge of excess mucus or purulent secretions, ear ache,   fever, chills, sweats, unintended wt loss or wt gain, classically pleuritic or exertional cp,  orthopnea pnd or leg swelling, presyncope, palpitations, abdominal pain, anorexia, nausea, vomiting, diarrhea  or change in bowel habits or change in bladder habits, change in stools or change in urine, dysuria, hematuria,  rash, arthralgias, visual complaints, headache, numbness, weakness or ataxia or problems with walking or coordination,  change in mood/affect or memory.        Current Meds  Medication Sig  . acetaminophen (TYLENOL) 500 MG tablet Take 1,000 mg by mouth 3 (three) times daily.   Marland Kitchen albuterol (ACCUNEB) 0.63 MG/3ML nebulizer solution Take 3 mLs (0.63 mg total) by nebulization every 4 (four) hours as needed for wheezing.  Marland Kitchen albuterol (PROVENTIL HFA;VENTOLIN HFA) 108 (90 Base) MCG/ACT inhaler Inhale 2 puffs every 4 (four) hours as needed into the lungs for shortness of breath (only if you can't catch your breath).  Marland Kitchen amLODipine (NORVASC) 10 MG tablet Take 10 mg by mouth daily.    . benzonatate (TESSALON) 200 MG capsule Take 1 capsule (200 mg total) by mouth 3 (three) times daily as needed for cough.  . bisoprolol (ZEBETA) 5 MG tablet TAKE 1 AND 1/2 TABLET BY MOUTH TWICE DAILY.  . budesonide-formoterol (SYMBICORT) 160-4.5 MCG/ACT inhaler Take 2 puffs first thing in am and then another 2 puffs about 12 hours later.  . canagliflozin (INVOKANA) 300 MG TABS tablet Take 300 mg by mouth daily before breakfast.  . cetirizine (ZYRTEC) 10 MG tablet Take 10 mg by mouth daily.  . cloNIDine (CATAPRES) 0.1 MG tablet Take 0.1 mg by mouth 2 (two) times daily.  Marland Kitchen glipiZIDE (GLUCOTROL XL) 5 MG 24 hr tablet Take 5 mg by mouth daily before breakfast.  . levothyroxine (SYNTHROID, LEVOTHROID) 150 MCG tablet Take 150 mcg by mouth daily before breakfast.   . losartan-hydrochlorothiazide (HYZAAR)  100-25 MG per tablet Take 1 tablet by mouth daily.  . metFORMIN (GLUCOPHAGE-XR) 500 MG 24 hr tablet Take 500 mg by mouth at bedtime.  . Multiple Vitamin (MULTIVITAMIN) tablet Take 1 tablet by mouth daily.    Marland Kitchen omeprazole (PRILOSEC OTC) 20 MG tablet Take 20 mg by mouth daily as needed.  . rivaroxaban (XARELTO) 20 MG TABS tablet Take 20 mg daily with supper by mouth.  . simvastatin (ZOCOR) 20 MG tablet Take 20 mg by mouth at bedtime.  Marland Kitchen  Objective:   Physical Exam  amb wm harsh upper airway pattern cough points to suprasternal notch where feels tickle  Vital signs reviewed - Note on arrival 02 sats  93% on RA  - note pulse 115 on arrival     02/12/2016 241 > 03/11/2016 243 > 06/23/2016  241 > 09/23/2016  241 > 12/05/2016  247 > 12/25/2016  246 > 02/16/2018  229     HEENT: nl dentition,  and oropharynx. Nl external ear canals without cough reflex - moderate bilateral non-specific turbinate edema     NECK :  without JVD/Nodes/TM/ nl carotid upstrokes bilaterally   LUNGS: no acc muscle use,  Nl contour chest  mid exp wheeze/ cough   CV:  RRR  no s3 or murmur or increase in P2, and no edema   ABD:  soft and nontender with nl inspiratory excursion in the supine position. No bruits or organomegaly appreciated, bowel sounds nl  MS:  Nl gait/ ext warm without deformities, calf tenderness, cyanosis or clubbing No obvious joint restrictions   SKIN: warm and dry without lesions    NEURO:  alert, approp, nl sensorium with  no motor or cerebellar deficits apparent.             CXR PA and Lateral:   02/16/2018 :    I personally reviewed images and agree with radiology impression as follows:    No acute findings.  Hyperinflated lungs.      Assessment & Plan:

## 2018-02-17 ENCOUNTER — Encounter: Payer: Self-pay | Admitting: Internal Medicine

## 2018-02-17 NOTE — Assessment & Plan Note (Signed)
-    02/12/2016    try symbicort 160 2bid instead of advair  - Allergy profile 03/11/2016 >  Eos 0.2 /  IgE  70 Pos RAST Mold > dust > cockroach   - Sinus CT 03/14/16 > Mild inflammatory changes s acute change - 03/11/2016   try symbicort 80 2bid  - 06/23/2016 flared on symbicort 80 with FENO  = 75 rec 160 2bid  - FENO 09/23/2016  =  32   On symbicort 160 2bid > no change rx   - FENO 02/16/2018  =   57 Spirometry 02/16/2018  FEV1 2.26 (70%)  Ratio 60   - 02/16/2018  After extensive coaching inhaler device  effectiveness =    90% > continue symb 160/ add singulair  He is clearly having a flare of asthma despite having adequate technique for inhaling prednisone in the form of Symbicort and high doses and cannot tolerate systemic steroids even apparently having atrial fibrillation  after  corticosteroid  joint injection.  There are several options here the first is to add Singulair to his regimen and the second is to add Qvar. The third would be to send him to an allergist to consider injections of Xolair or one of the new IL-5 inhibitors.  I also think he has an upper airway component of the problem discussed separately (see separate a/p)    I had an extended discussion with the patient reviewing all relevant studies completed to date and  lasting 25 minutes of a 40  minute extended office  visit to re-establish with me     re  severe non-specific but potentially very serious refractory respiratory symptoms of uncertain and potentially multiple  etiologies.   Each maintenance medication was reviewed in detail including most importantly the difference between maintenance and prns and under what circumstances the prns are to be triggered using an action plan format that is not reflected in the computer generated alphabetically organized AVS.    Please see AVS for specific instructions unique to this office visit that I personally wrote and verbalized to the the pt in detail and then reviewed with pt  by my  nurse highlighting any changes in therapy/plan of care  recommended at today's visit.

## 2018-02-17 NOTE — Assessment & Plan Note (Addendum)
-   Allergy profile 03/11/2016 >  Eos 0.2 /  IgE  70 Pos RAST Mold > dust > cockroach   - Sinus CT 03/14/16 > Mild inflammatory changes s acute change  - 02/16/2018  Added 1st gen H1 blockers per guidelines  And max rx for gerd  Of the three most common causes of  Sub-acute or recurrent or chronic cough, only one (GERD)  can actually contribute to/ trigger  the other two (asthma and post nasal drip syndrome)  and perpetuate the cylce of cough.  While not intuitively obvious, many patients with chronic low grade reflux do not cough until there is a primary insult that disturbs the protective epithelial barrier and exposes sensitive nerve endings.   This is typically viral as well may have been the trigger here  but can be may also be due to postnasal drip syndrome which may be best treated with first generation antihistamines if he can tolerate them..     The point is that once this occurs, it is difficult to eliminate the cycle  using anything but a maximally effective acid suppression regimen at least in the short run, accompanied by an appropriate diet to address non acid GERD and control / eliminate the cough itself for at least 3 days with tramadol  see avs for instructions unique to this ov

## 2018-03-02 ENCOUNTER — Encounter: Payer: Self-pay | Admitting: Internal Medicine

## 2018-03-02 ENCOUNTER — Ambulatory Visit (INDEPENDENT_AMBULATORY_CARE_PROVIDER_SITE_OTHER)
Admission: RE | Admit: 2018-03-02 | Discharge: 2018-03-02 | Disposition: A | Discharge status: Home / Self Care | Payer: Medicare Other | Source: Ambulatory Visit | Attending: Internal Medicine | Admitting: Internal Medicine

## 2018-03-02 ENCOUNTER — Ambulatory Visit (INDEPENDENT_AMBULATORY_CARE_PROVIDER_SITE_OTHER): Payer: Medicare Other | Admitting: Internal Medicine

## 2018-03-02 VITALS — BP 102/60 | HR 110 | Temp 97.0°F | Ht 70.0 in | Wt 226.6 lb

## 2018-03-02 DIAGNOSIS — I4891 Unspecified atrial fibrillation: Secondary | ICD-10-CM

## 2018-03-02 DIAGNOSIS — I481 Persistent atrial fibrillation: Secondary | ICD-10-CM | POA: Diagnosis not present

## 2018-03-02 DIAGNOSIS — I4819 Other persistent atrial fibrillation: Secondary | ICD-10-CM

## 2018-03-02 DIAGNOSIS — J45991 Cough variant asthma: Secondary | ICD-10-CM

## 2018-03-02 DIAGNOSIS — J189 Pneumonia, unspecified organism: Secondary | ICD-10-CM

## 2018-03-02 DIAGNOSIS — J181 Lobar pneumonia, unspecified organism: Secondary | ICD-10-CM | POA: Diagnosis not present

## 2018-03-02 MED ORDER — FLUTTER DEVI: 0 refills | Status: DC

## 2018-03-02 MED ORDER — AMOXICILLIN-POT CLAVULANATE 875-125 MG PO TABS: 1.0000 | ORAL_TABLET | Freq: Two times a day (BID) | ORAL | 0 refills | Status: AC

## 2018-03-02 NOTE — Patient Instructions (Addendum)
Augmentin 875 mg take one pill twice daily  X 10 days - take at breakfast and supper with large glass of water.  It would help reduce the usual side effects (diarrhea and yeast infections) if you ate cultured yogurt at lunch.    For cough >  mucinex dm 1200 mg every 12 hours and use the flutter valve as much as you can    For drainage / throat tickle try take CHLORPHENIRAMINE  4 mg - take one every 4 hours as needed - available over the counter- may cause drowsiness so start with just a bedtime dose or two and see how you tolerate it before trying in daytime     Please remember to go to the  x-ray department downstairs in the basement  for your tests - we will call you with the results when they are available.      See Tammy NP w/in 2 weeks or first available with all your medications, even over the counter meds, separated in two separate bags, the ones you take no matter what vs the ones you stop once you feel better and take only as needed when you feel you need them.   Tammy  will generate for you a new user friendly medication calendar that will put [place] all on the same page re: your medication use.     Without this process, it simply isn't possible to assure that we are providing  your outpatient care  with  the attention to detail we feel you deserve.   If we cannot assure that you're getting that kind of care,  then we cannot manage your problem effectively from this clinic.  Once you have seen Tammy and we are sure that we're all on the same page with your medication use she will arrange follow up with me.

## 2018-03-02 NOTE — Progress Notes (Signed)
Spoke with pt and notified of results per Dr. Wert. Pt verbalized understanding and denied any questions. 

## 2018-03-02 NOTE — Progress Notes (Signed)
Subjective:   Patient ID: Clarence Dawson, male    DOB: October 06, 1946     MRN: [id number]    Brief patient profile:  72  yowm quit smoking 1985 onset of breathing problems mid 2000s rx advair and helped a lot until around spring 2016 with cough varaiably productive not responding to abx x 2 / antiviral rx and referred to pulmonary clinic 01/24/2016 by Dr Willey Blade for refractory cough.    Brief patient profile:  01/24/2016 1st [facility] office visit/ Twanisha Foulk  maint rx = advair 250 Chief Complaint  Patient presents with  . PULMONARY CONSULT    Referred by Dr. [name]. Pt c/o cough that is sometimes productive with white-brown thick mucus and some wheeze. Pt denies SOB/CP/tightness. Pt does have history of asthma since childhood. Pt is not on maintenance inhalers.   cough is worse before supper until 10 pm goes to sleep and does fine and then recurs p stirs each am  Not limited by breathing from desired activities   rec Stop lopressor and replace with bystolic 10 mg twice daily  Stop advair > Prednisone 10 mg take  4 each am x 2 days,   2 each am x 2 days,  1 each am x 2 days and stop  Pantoprazole (protonix) 40 mg   Take  30-60 min before first meal of the day and Pepcid (famotidine)  20 mg one @  bedtime until return to office - this is the best way to tell whether stomach acid is contributing to your problem.   GERD diet   06/23/2016  f/u ov/Roni Scow re:  Recurrent cough x one year Chief Complaint  Patient presents with  . Follow-up    Pt states that his cough is sometimes better and sometimes worse. Cough is prod with thick, light brown sputum.   cough is highly variable symbiocrt 80 2 every 12 hours / prn proventil  Tends to be worse p stirring in am for first hours and one hour before hs  Always better when has been rx'd with prednisone / ? Better on 160 vs 80  rec Change symbicort to 160 Take 2 puffs first thing in am and then another 2 puffs about 12 hours later.  If not better  Prednisone 10 mg take  4 each am x 2 days,   2 each am x 2 days,  1 each am x 2 days and stop  Please remember to go to the  x-ray department downstairs for your tests - we will call you with the results when they are available.    01/19/18 acute ov for cough / NP  eval -  already tried augmentin x 2 no better  rec Begin Zyrtec 10 mg bedtime .  Mucinex twice Twice daily  As needed  Cough/congestion  Begin Delsym Twice a day as needed for cough Begin Pepcid 20 mg at At bedtime .  Sips of water to soothe throat and avoid coughing . NO MINTS .  Tessalon Three times a day  As needed  Cough.    02/16/2018  Extended f/u ov/Beth Goodlin re:  Re-estatablish re Refractory cough x Nov 2018  Chief Complaint  Patient presents with  . Follow-up    Cough has "probably been worse" since the last visit. Cough is esp worse at night and is waking him up.  He occ produces some white to brown sputum.    onset late November 2018 tickle in throat with minimal mucus variably discolored  worse at hs no better with abx  Severe to point of gagging but no vomiting Dyspnea:  Fine as long as not coughing Sleep: helps to sit up / normally flat at hs but sleeping > 45 degrees now Never got steroids this time "they cause a fib "  Overt hb on otc ppi prn and lots of tums Not using saba at all  rec Change prilosec to 40 mg Take 30- 60 min before your first and last meals of the day and continue pepcid at bedtime GERD singulair 10 mg at bedtime  For drainage / throat tickle try take CHLORPHENIRAMINE  4 mg - take one every 4 hours as needed - available over the counter- may cause drowsiness so start with just a bedtime dose or two and see how you tolerate it before trying in daytime   Take delsym two tsp every 12 hours and supplement if needed with  tramadol 50 mg up to 2 every 4 hours to suppress the urge to cough. Swallowing water or using ice chips/non mint and menthol containing candies (such as lifesavers or sugarless jolly  ranchers) are also effective.  You should rest your voice and avoid activities that you know make you cough. Once you have eliminated the cough for 3 straight days try reducing the tramadol first,  then the delsym as tolerated.  Please schedule a follow up office visit in 2  weeks, sooner if needed  with all medications /inhalers/ solutions in hand so we can verify exactly what you are taking. This includes all medications from all doctors and over the counters    03/02/2018  f/u ov/Darbie Biancardi re:  Asthmatic cough / did not bring all meds as req Chief Complaint  Patient presents with  . Follow-up    Cough has improved some- now producing some brown to red sputum. He has had fever off and on over the past several days. He states he has noticed some DOE recently with minimal exertion.    Dyspnea: was doing  Better until 02/23/18 then much worse 2 d prior to OV  And needing saba again  Cough: now productive brown mucus Sleep: ok p rx  Some pleuritic discomfort Right ant base   No obvious day to day or daytime variability or assoc mucus plugs or hemoptysis or cp or chest tightness, subjective wheeze or overt sinus or hb symptoms. No unusual exposure hx or h/o childhood pna/ asthma or knowledge of premature birth.  Sleeping ok flat without nocturnal  or early am exacerbation  of respiratory  c/o's or need for noct saba. Also denies any obvious fluctuation of symptoms with weather or environmental changes or other aggravating or alleviating factors except as outlined above   Current Allergies, Complete Past Medical History, Past Surgical History, Family History, and Social History were reviewed in [employer] record.  ROS  The following are not active complaints unless bolded Hoarseness, sore throat, dysphagia, dental problems, itching, sneezing,  nasal congestion or discharge of excess mucus or purulent secretions, ear ache,   fever, chills, sweats, unintended wt loss or wt gain,  classically  exertional cp,  orthopnea pnd or leg swelling, presyncope, palpitations, abdominal pain, anorexia, nausea, vomiting, diarrhea  or change in bowel habits or change in bladder habits, change in stools or change in urine, dysuria, hematuria,  rash, arthralgias, visual complaints, headache, numbness, weakness or ataxia or problems with walking or coordination,  change in mood/affect or memory.  Current Meds  Medication Sig  . acetaminophen (TYLENOL) 500 MG tablet Take 1,000 mg by mouth 3 (three) times daily.   Marland Kitchen albuterol (ACCUNEB) 0.63 MG/3ML nebulizer solution Take 3 mLs (0.63 mg total) by nebulization every 4 (four) hours as needed for wheezing.  Marland Kitchen albuterol (PROVENTIL HFA;VENTOLIN HFA) 108 (90 Base) MCG/ACT inhaler Inhale 2 puffs every 4 (four) hours as needed into the lungs for shortness of breath (only if you can't catch your breath).  Marland Kitchen amLODipine (NORVASC) 10 MG tablet Take 10 mg by mouth daily.    . benzonatate (TESSALON) 200 MG capsule Take 1 capsule (200 mg total) by mouth 3 (three) times daily as needed for cough.  . bisoprolol (ZEBETA) 5 MG tablet TAKE 1 AND 1/2 TABLET BY MOUTH TWICE DAILY.  . budesonide-formoterol (SYMBICORT) 160-4.5 MCG/ACT inhaler Take 2 puffs first thing in am and then another 2 puffs about 12 hours later.  . canagliflozin (INVOKANA) 300 MG TABS tablet Take 300 mg by mouth daily before breakfast.  . cetirizine (ZYRTEC) 10 MG tablet Take 10 mg by mouth daily.  . cloNIDine (CATAPRES) 0.1 MG tablet Take 0.1 mg by mouth 2 (two) times daily.  Marland Kitchen dextromethorphan 15 MG/5ML syrup Take 10 mLs by mouth 4 (four) times daily as needed for cough.  . famotidine (PEPCID) 20 MG tablet Take 20 mg by mouth at bedtime.  Marland Kitchen glipiZIDE (GLUCOTROL XL) 5 MG 24 hr tablet Take 5 mg by mouth daily before breakfast.  . levothyroxine (SYNTHROID, LEVOTHROID) 150 MCG tablet Take 150 mcg by mouth daily before breakfast.   . losartan-hydrochlorothiazide (HYZAAR) 100-25 MG per tablet  Take 1 tablet by mouth daily.  . metFORMIN (GLUCOPHAGE-XR) 500 MG 24 hr tablet Take 500 mg by mouth at bedtime.  . montelukast (SINGULAIR) 10 MG tablet One at bedtime every night  . Multiple Vitamin (MULTIVITAMIN) tablet Take 1 tablet by mouth daily.    Marland Kitchen omeprazole (PRILOSEC OTC) 20 MG tablet Take 20 mg by mouth daily as needed.  . rivaroxaban (XARELTO) 20 MG TABS tablet Take 20 mg daily with supper by mouth.  . simvastatin (ZOCOR) 20 MG tablet Take 20 mg by mouth at bedtime.  . traMADol (ULTRAM) 50 MG tablet 1-2 every 4 hours as needed for cough or pain                        Objective:   Physical Exam    amb wm nad  Vital signs reviewed - Note on arrival 02 sats  94% on RA     02/12/2016 241 > 03/11/2016 243 > 06/23/2016  241 > 09/23/2016  241 > 12/05/2016  247 > 12/25/2016  246 > 02/16/2018  229  > 03/02/2018  227        HEENT: nl dentition, turbinates bilaterally, and oropharynx. Nl external ear canals without cough reflex   NECK :  without JVD/Nodes/TM/ nl carotid upstrokes bilaterally   LUNGS: no acc muscle use,  Nl contour chest with bilateral coarse exp rhonchi exp cough   CV:  RRR  no s3 or murmur or increase in P2, and no edema   ABD:  soft and nontender with nl inspiratory excursion in the supine position. No bruits or organomegaly appreciated, bowel sounds nl  MS:  Nl gait/ ext warm without deformities, calf tenderness, cyanosis or clubbing No obvious joint restrictions   SKIN: warm and dry without lesions    NEURO:  alert, approp, nl sensorium with  no  motor or cerebellar deficits apparent.       ekg  03/02/2018  ? Paced/ read as afib on computer but hard to tell what the native rhythm is         Assessment & Plan:

## 2018-03-03 ENCOUNTER — Encounter: Payer: Self-pay | Admitting: Internal Medicine

## 2018-03-03 DIAGNOSIS — J189 Pneumonia, unspecified organism: Secondary | ICD-10-CM | POA: Insufficient documentation

## 2018-03-03 DIAGNOSIS — A419 Sepsis, unspecified organism: Secondary | ICD-10-CM | POA: Insufficient documentation

## 2018-03-03 NOTE — Assessment & Plan Note (Addendum)
-    02/12/2016    try symbicort 160 2bid instead of advair  - Allergy profile 03/11/2016 >  Eos 0.2 /  IgE  70 Pos RAST Mold > dust > cockroach   - Sinus CT 03/14/16 > Mild inflammatory changes s acute change - 03/11/2016   try symbicort 80 2bid  - 06/23/2016 flared on symbicort 80 with FENO  = 75 rec 160 2bid  - FENO 09/23/2016  =  32   On symbicort 160 2bid > no change rx  - FENO 02/16/2018  =   57 Spirometry 02/16/2018  FEV1 2.26 (70%)  Ratio 60   - 02/16/2018  After extensive coaching inhaler device  effectiveness =    90% > continue symb 160/ add singulair  03/02/2018 demonstrated flutter valve    Acute flare in setting of uri/ rml atx ? pna  rec  Add flutter/ mucinex dm / rx augmentinx 10 days

## 2018-03-03 NOTE — Assessment & Plan Note (Signed)
RML 03/02/2018 > rx augmentin x 10 days as this looks more like mucus plugging/ atx on cxr than typical cap

## 2018-03-03 NOTE — Assessment & Plan Note (Signed)
Apparently very sensitive to steroids > will ask Dr Clarence Dawson re options

## 2018-03-16 ENCOUNTER — Ambulatory Visit: Payer: Medicare Other | Admitting: Adult Health

## 2018-03-16 ENCOUNTER — Ambulatory Visit (INDEPENDENT_AMBULATORY_CARE_PROVIDER_SITE_OTHER)
Admission: RE | Admit: 2018-03-16 | Discharge: 2018-03-16 | Disposition: A | Discharge status: Home / Self Care | Payer: Medicare Other | Source: Ambulatory Visit | Attending: Adult Health | Admitting: Adult Health

## 2018-03-16 ENCOUNTER — Encounter: Payer: Self-pay | Admitting: Adult Health

## 2018-03-16 VITALS — BP 106/64 | HR 78 | Ht 70.0 in | Wt 225.8 lb

## 2018-03-16 DIAGNOSIS — J181 Lobar pneumonia, unspecified organism: Secondary | ICD-10-CM

## 2018-03-16 DIAGNOSIS — J4531 Mild persistent asthma with (acute) exacerbation: Secondary | ICD-10-CM

## 2018-03-16 DIAGNOSIS — J189 Pneumonia, unspecified organism: Secondary | ICD-10-CM

## 2018-03-16 NOTE — Progress Notes (Signed)
@Patient  ID: Clarence Dawson, male    DOB: [birth year], 72 y.o.   MRN: [id number]  Chief Complaint  Patient presents with  . Follow-up    Asthma     Referring provider: [name], MD  HPI: 72 year old male former smoker, quit 1985, followed for cough variant asthma PMH A Fib and D CHF on Xarelto   TEST  - Allergy profile 03/11/2016 >  Eos 0.2 /  IgE  70 Pos RAST Mold > dust > cockroach   - Sinus CT 03/14/16 > Mild inflammatory changes s acute change  - FENO 09/23/2016  =  32   On symbicort 160 2bid > no change rx  - FENO 02/16/2018  =   57 12/2017 Spirometry shows mild airflow obstruction with an FEV1 at 78%, ratio 61, FVC 94%.. 12/2017 Exhaled nitric oxide testing was minimally elevated at 28  03/16/2018 Follow up : Cough Variant Asthma  Patient presents for a 2-week follow-up.  Visit patient was having a asthmatic bronchitic exacerbation.Marland KitchenLurena Nida . CXR showed   RML PNA He was treated with a 10-day course of Augmentin.  Patient says he is feeling some better , congestion is improved. Less cough and dyspnea.  Getting ready to go on vacation to [place] .    Patient is brought all of his medications in today for review.  We reviewed all his medications organize them into a medication calendar with patient education.  He appears to be taking his medications correctly.   Allergies  Allergen Reactions  . Fish Allergy Anaphylaxis and Shortness Of Breath    SEAFOOD  . Food Anaphylaxis and Shortness Of Breath    TREE NUTS  . Iodinated Diagnostic Agents Hives and Shortness Of Breath    Patient states hives to throat closing. (01/15/17: patient states this reaction was "about 20 years ago" with possibly an IVP.  He now says high doses of prednisone "throw me into AFib."  He has tolerated CT arthrograms with Benadrly 50mg  PO one hour before injection.  Brita Romp, RN)  . Shellfish Allergy Anaphylaxis    To shellfish, crabs.  Makes him feel like "things are crawling all over" me.  Denies airway  issues with these foods.  Brita Romp, RN 01/15/17)  . Goat-Derived Products Hives    GOAT CHEESE GOATS  . Prednisone Palpitations    PRECIPITATES A-FIB  . Clavulanic Acid Diarrhea  . Voltaren [Diclofenac Sodium] Other (See Comments)    Feels like things are crawling on him    Immunization History  Administered Date(s) Administered  . Influenza Split 11/14/2015  . Influenza, High Dose Seasonal PF 09/28/2017  . Influenza-Unspecified 11/02/2016  . Pneumococcal Conjugate-13 09/28/2015    Past Medical History:  Diagnosis Date  . Allergic rhinitis   . Aortic valve disorder   . Asthma    since childhood- seasonal allergies induced  . Cancer ([place])    Skin cancer- squamous, basal  . Carotid artery stenosis   . Essential hypertension   . Full dentures   . GERD (gastroesophageal reflux disease)   . H/O hiatal hernia   . Hemorrhage of rectum   . Hyperlipidemia   . Hypothyroidism   . 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 OSA (obstructive sleep apnea)   . Osteoarthritis   . Pacemaker    Oct 2005 in [place].  Marland Kitchen PAF (paroxysmal atrial fibrillation) ([place])   . Pneumonia    "several Times" 2015 last time  . Primary localized osteoarthritis of right knee 08/11/2017  .  RBBB (right bundle branch block)   . Sinoatrial node dysfunction ([facility])   . Syncope   . Tricuspid valve disorder   . Type 2 diabetes mellitus ([facility])    Type II    Tobacco History: Social History   Tobacco Use  Smoking Status Former Smoker  . Packs/day: 1.00  . Years: 25.00  . Pack years: 25.00  . Types: Cigarettes  . Start date: 02/20/1958  . Last attempt to quit: 02/12/1984  . Years since quitting: 34.1  Smokeless Tobacco Never Used   Counseling given: Not Answered   Outpatient Encounter Medications as of 03/16/2018  Medication Sig  . acetaminophen (TYLENOL) 500 MG tablet Take 1,000 mg by mouth 3 (three) times daily.   Marland Kitchen albuterol (ACCUNEB) 0.63 MG/3ML nebulizer solution Take 3 mLs (0.63 mg total) by nebulization every 4  (four) hours as needed for wheezing.  Marland Kitchen albuterol (PROVENTIL HFA;VENTOLIN HFA) 108 (90 Base) MCG/ACT inhaler Inhale 2 puffs every 4 (four) hours as needed into the lungs for shortness of breath (only if you can't catch your breath).  Marland Kitchen amLODipine (NORVASC) 10 MG tablet Take 10 mg by mouth daily.    . benzonatate (TESSALON) 200 MG capsule Take 1 capsule (200 mg total) by mouth 3 (three) times daily as needed for cough.  . bisoprolol (ZEBETA) 5 MG tablet TAKE 1 AND 1/2 TABLET BY MOUTH TWICE DAILY.  . budesonide-formoterol (SYMBICORT) 160-4.5 MCG/ACT inhaler Take 2 puffs first thing in am and then another 2 puffs about 12 hours later.  . canagliflozin (INVOKANA) 300 MG TABS tablet Take 300 mg by mouth daily before breakfast.  . cetirizine (ZYRTEC) 10 MG tablet Take 10 mg by mouth daily.  . cloNIDine (CATAPRES) 0.1 MG tablet Take 0.1 mg by mouth 2 (two) times daily.  Marland Kitchen dextromethorphan 15 MG/5ML syrup Take 10 mLs by mouth 4 (four) times daily as needed for cough.  . famotidine (PEPCID) 20 MG tablet Take 20 mg by mouth at bedtime.  Marland Kitchen glipiZIDE (GLUCOTROL XL) 5 MG 24 hr tablet Take 5 mg by mouth daily before breakfast.  . levothyroxine (SYNTHROID, LEVOTHROID) 150 MCG tablet Take 150 mcg by mouth daily before breakfast.   . losartan-hydrochlorothiazide (HYZAAR) 100-25 MG per tablet Take 1 tablet by mouth daily.  . metFORMIN (GLUCOPHAGE-XR) 500 MG 24 hr tablet Take 500 mg by mouth at bedtime.  . montelukast (SINGULAIR) 10 MG tablet One at bedtime every night  . Multiple Vitamin (MULTIVITAMIN) tablet Take 1 tablet by mouth daily.    Marland Kitchen omeprazole (PRILOSEC OTC) 20 MG tablet Take 20 mg by mouth daily as needed.  Marland Kitchen Respiratory Therapy Supplies (FLUTTER) DEVI Use as directed  . rivaroxaban (XARELTO) 20 MG TABS tablet Take 20 mg daily with supper by mouth.  . simvastatin (ZOCOR) 20 MG tablet Take 20 mg by mouth at bedtime.  . traMADol (ULTRAM) 50 MG tablet 1-2 every 4 hours as needed for cough or pain   No  facility-administered encounter medications on file as of 03/16/2018.      Review of Systems  Constitutional:   No  weight loss, night sweats,  Fevers, chills, fatigue, or  lassitude.  HEENT:   No headaches,  Difficulty swallowing,  Tooth/dental problems, or  Sore throat,                No sneezing, itching, ear ache,  +nasal congestion, post nasal drip,   CV:  No chest pain,  Orthopnea, PND, swelling in lower extremities, anasarca, dizziness, palpitations, syncope.  GI  No heartburn, indigestion, abdominal pain, nausea, vomiting, diarrhea, change in bowel habits, loss of appetite, bloody stools.   Resp:  No chest wall deformity  Skin: no rash or lesions.  GU: no dysuria, change in color of urine, no urgency or frequency.  No flank pain, no hematuria   MS:  No joint pain or swelling.  No decreased range of motion.  No back pain.    Physical Exam  BP 106/64 (BP Location: Right Arm, Cuff Size: Normal)   Pulse 78   Ht 5\' 10"  (1.778 m)   Wt 225 lb 12.8 oz (102.4 kg)   SpO2 99%   BMI 32.40 kg/m   GEN: A/Ox3; pleasant , NAD,   HEENT:  [place]/AT,  EACs-clear, TMs-wnl, NOSE-clear drainage , THROAT-clear, no lesions, no postnasal drip or exudate noted.   NECK:  Supple w/ fair ROM; no JVD; normal carotid impulses w/o bruits; no thyromegaly or nodules palpated; no lymphadenopathy.    RESP  Clear  P & A; w/o, wheezes/ rales/ or rhonchi. no accessory muscle use, no dullness to percussion  CARD:  RRR, no m/r/g, no peripheral edema, pulses intact, no cyanosis or clubbing.  GI:   Soft & nt; nml bowel sounds; no organomegaly or masses detected.   Musco: Warm bil, no deformities or joint swelling noted.   Neuro: alert, no focal deficits noted.    Skin: Warm, no lesions or rashes    Lab Results:  CBC    Component Value Date/Time   WBC 6.8 01/19/2018 1016   RBC 5.65 01/19/2018 1016   HGB 17.4 (H) 01/19/2018 1016   HCT 51.2 01/19/2018 1016   PLT 182.0 01/19/2018 1016   MCV  90.7 01/19/2018 1016   MCH 29.6 08/28/2017 0425   MCHC 34.1 01/19/2018 1016   RDW 13.6 01/19/2018 1016   LYMPHSABS 1.7 01/19/2018 1016   MONOABS 0.8 01/19/2018 1016   EOSABS 0.2 01/19/2018 1016   BASOSABS 0.0 01/19/2018 1016    BMET    Component Value Date/Time   NA 139 01/19/2018 1016   K 4.1 01/19/2018 1016   CL 101 01/19/2018 1016   CO2 31 01/19/2018 1016   GLUCOSE 112 (H) 01/19/2018 1016   BUN 17 01/19/2018 1016   CREATININE 1.03 01/19/2018 1016   CREATININE 1.27 (H) 08/15/2016 0941   CALCIUM 9.7 01/19/2018 1016   GFRNONAA >60 08/28/2017 0425   GFRAA >60 08/28/2017 0425    BNP No results found for: BNP  ProBNP    Component Value Date/Time   PROBNP 95.0 01/19/2018 1016    Imaging: Dg Chest 2 View  Result Date: 03/16/2018 CLINICAL DATA:  Cough and congestion. EXAM: CHEST - 2 VIEW COMPARISON:  03/02/2018. FINDINGS: Cardiac pacer with lead tips over the right atrium and right ventricle. Heart size normal. Low lung volumes. Mild basilar atelectasis. Interval near complete clearing of right base infiltrate. No pleural effusion or pneumothorax. Degenerative changes thoracic spine. IMPRESSION: 1. Cardiac pacer with lead tips over the right atrium right ventricle. 2. Low lung volumes with mild basilar atelectasis. Interval clearing scratched it near complete interval clearing of left base infiltrate. Electronically Signed   By: Marcello Moores  Register   On: 03/16/2018 11:48   Dg Chest 2 View  Result Date: 03/02/2018 CLINICAL DATA:  Cough, shortness of breath and low-grade fever for 3 days, diabetes mellitus, hypertension, atrial fibrillation, pacemaker, asthma since childhood EXAM: CHEST - 2 VIEW COMPARISON:  01/19/2017 FINDINGS: LEFT subclavian transvenous pacemaker leads project at RIGHT atrium  and RIGHT ventricle, unchanged. Upper normal heart size. Mediastinal contours and pulmonary vascularity normal. New RIGHT middle lobe infiltrate with associated volume loss. Minimal LEFT basilar  atelectasis. Mild central peribronchial thickening. Upper lungs clear. No pleural effusion or pneumothorax. Scattered endplate spur formation thoracic spine with note of osseous demineralization. IMPRESSION: RIGHT middle lobe pneumonia with associated volume loss. Minimal LEFT basilar atelectasis. Electronically Signed   By: Lavonia Dana M.D.   On: 03/02/2018 15:52     Assessment & Plan:   CAP (community acquired pneumonia) RML PNA clinically improved with abx   Check cxr for clearance   Plan  Patient Instructions  Continue on current regimen .  Follow medication calendar closely and bring to each visit.  Follow up with Dr. Melvyn Novas  In 3 months and As needed   Please contact office for sooner follow up if symptoms do not improve or worsen or seek emergency care       Asthma with acute exacerbation Recent flare with PNA -now improving  Patient's medications were reviewed today and patient education was given. Computerized medication calendar was adjusted/completed   Plan  Patient Instructions  Continue on current regimen .  Follow medication calendar closely and bring to each visit.  Follow up with Dr. Melvyn Novas  In 3 months and As needed   Please contact office for sooner follow up if symptoms do not improve or worsen or seek emergency care          Rexene Edison, NP 03/16/2018

## 2018-03-16 NOTE — Assessment & Plan Note (Signed)
RML PNA clinically improved with abx   Check cxr for clearance   Plan  Patient Instructions  Continue on current regimen .  Follow medication calendar closely and bring to each visit.  Follow up with Dr. Melvyn Novas  In 3 months and As needed   Please contact office for sooner follow up if symptoms do not improve or worsen or seek emergency care

## 2018-03-16 NOTE — Progress Notes (Signed)
Chart and office note reviewed in detail  > agree with a/p as outlined    

## 2018-03-16 NOTE — Patient Instructions (Signed)
Continue on current regimen .  Follow medication calendar closely and bring to each visit.  Follow up with Dr. Melvyn Novas  In 3 months and As needed   Please contact office for sooner follow up if symptoms do not improve or worsen or seek emergency care

## 2018-03-16 NOTE — Assessment & Plan Note (Signed)
Recent flare with PNA -now improving  Patient's medications were reviewed today and patient education was given. Computerized medication calendar was adjusted/completed   Plan  Patient Instructions  Continue on current regimen .  Follow medication calendar closely and bring to each visit.  Follow up with Dr. Melvyn Novas  In 3 months and As needed   Please contact office for sooner follow up if symptoms do not improve or worsen or seek emergency care

## 2018-03-17 NOTE — Addendum Note (Signed)
Addended by: Parke Poisson E on: 03/17/2018 05:21 PM   Modules accepted: Orders

## 2018-03-24 ENCOUNTER — Telehealth: Payer: Self-pay | Admitting: Internal Medicine

## 2018-03-24 NOTE — Telephone Encounter (Signed)
Spoke with pt. States that he is running a fever and coughing up blood. He is currently in [place], [place]. Advised him that we can't treat something of this nature over the phone. He will seek medical care in [place]. Nothing further was needed.

## 2018-03-29 ENCOUNTER — Other Ambulatory Visit (HOSPITAL_COMMUNITY): Payer: Self-pay | Admitting: Internal Medicine

## 2018-03-29 DIAGNOSIS — R059 Cough, unspecified: Secondary | ICD-10-CM

## 2018-03-29 DIAGNOSIS — R05 Cough: Secondary | ICD-10-CM

## 2018-03-30 ENCOUNTER — Ambulatory Visit (HOSPITAL_COMMUNITY)
Admission: RE | Admit: 2018-03-30 | Discharge: 2018-03-30 | Disposition: A | Discharge status: Home / Self Care | Payer: Medicare Other | Source: Ambulatory Visit | Attending: Internal Medicine | Admitting: Internal Medicine

## 2018-03-30 DIAGNOSIS — I700 Atherosclerosis of aorta: Secondary | ICD-10-CM | POA: Diagnosis not present

## 2018-03-30 DIAGNOSIS — I2510 Atherosclerotic heart disease of native coronary artery without angina pectoris: Secondary | ICD-10-CM | POA: Insufficient documentation

## 2018-03-30 DIAGNOSIS — R05 Cough: Secondary | ICD-10-CM | POA: Insufficient documentation

## 2018-03-30 DIAGNOSIS — J439 Emphysema, unspecified: Secondary | ICD-10-CM | POA: Insufficient documentation

## 2018-03-30 DIAGNOSIS — R590 Localized enlarged lymph nodes: Secondary | ICD-10-CM | POA: Diagnosis not present

## 2018-03-30 DIAGNOSIS — R059 Cough, unspecified: Secondary | ICD-10-CM

## 2018-04-14 ENCOUNTER — Other Ambulatory Visit: Payer: Self-pay | Admitting: *Deleted

## 2018-04-14 ENCOUNTER — Institutional Professional Consult (permissible substitution): Payer: Medicare Other | Admitting: Cardiothoracic Surgery

## 2018-04-14 VITALS — BP 116/69 | HR 79 | Resp 20 | Ht 70.0 in | Wt 220.0 lb

## 2018-04-14 DIAGNOSIS — R918 Other nonspecific abnormal finding of lung field: Secondary | ICD-10-CM | POA: Diagnosis not present

## 2018-04-14 DIAGNOSIS — R911 Solitary pulmonary nodule: Secondary | ICD-10-CM

## 2018-04-14 NOTE — Progress Notes (Signed)
[name]       [place] [address]             [phone]                    Clarence Dawson [facility] Medical Record #[id number] Date of Birth: Jun 29, 1946  Referring: [name], MD Primary Care: [name], MD Primary Cardiologist: No primary care provider on file.  Chief Complaint:    Chief Complaint  Patient presents with  . Lung Lesion    Surgical eval, Chest CT 03/30/18, Spirometry 02/16/18  . Hemoptysis    History of Present Illness:    Clarence Dawson 72 y.o. male is seen in the office  today for a chronic cough for 6 months duration associated with episodic hemoptysis.  Chest x-ray suggested right lower lobe pneumonia.  Patient has been treated on 2 separate occasions with p.o. Antibiotics. ,  Without much improvement in his symptoms.  He has had multiple episodes of coughing up blood-tinged sputum.  He is on Xarelto for atrial fib.  While traveling in [place] on vacation he developed episode of fever and chills which spontaneously resolved. Recently a CT scan of the chest was done and the patient was referred to the surgical office.  His past medical history includes episodic atrial fibrillation especially when put on steroids for respiratory difficulty, has a history of chronic hypothyroidism on Synthroid since 1989.  He is a previous smoker but stopped in 1984.  In October 2005 he passed out and fell off a ladder and ultimately a permanent pacemaker was placed while living in [place].  In 2010 while in [place] he had what he describes as "map and zap".   Patient is currently diabetic treated with oral agents including Invocana  Current Activity/ Functional Status:  Patient is independent with mobility/ambulation, transfers, ADL's, IADL's.   Zubrod Score: At the time of surgery this patient's most appropriate activity status/level should be described as: []     0    Normal activity, no symptoms [x]     1    Restricted in physical  strenuous activity but ambulatory, able to do out light work []     2    Ambulatory and capable of self care, unable to do work activities, up and about               >50 % of waking hours                              []     3    Only limited self care, in bed greater than 50% of waking hours []     4    Completely disabled, no self care, confined to bed or chair []     5    Moribund   Past Medical History:  Diagnosis Date  . Allergic rhinitis   . Aortic valve disorder   . Asthma    since childhood- seasonal allergies induced  . Cancer ([facility])    Skin cancer- squamous, basal  . Carotid artery stenosis   . Essential hypertension   . Full dentures   . GERD (gastroesophageal reflux disease)   . H/O hiatal hernia   . Hemorrhage of rectum   . Hyperlipidemia   . Hypothyroidism   . 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 OSA (obstructive sleep apnea)   . Osteoarthritis   . Pacemaker  Oct 2005 in [place].  Marland Kitchen PAF (paroxysmal atrial fibrillation) ([facility])   . Pneumonia    "several Times" 2015 last time  . Primary localized osteoarthritis of right knee 08/11/2017  . RBBB (right bundle branch block)   . Sinoatrial node dysfunction ([facility])   . Syncope   . Tricuspid valve disorder   . Type 2 diabetes mellitus ([facility])    Type II    Past Surgical History:  Procedure Laterality Date  . A-V CARDIAC PACEMAKER INSERTION     Sick sinus syndrome DDR pacer  . Arthropathy Right 2005   Rebuilding of left thumb and joint   . CARDIAC CATHETERIZATION    . CARDIAC ELECTROPHYSIOLOGY STUDY AND ABLATION  09/2008   for pvcs, Dr. Loralie Champagne  . CARDIOVERSION N/A 12/18/2016   Procedure: CARDIOVERSION;  Surgeon: Pixie Casino, MD;  Location: [facility] ENDOSCOPY;  Service: Cardiovascular;  Laterality: N/A;  . [place]   right wrist  . CARPAL TUNNEL RELEASE  05/04/2012   Procedure: CARPAL TUNNEL RELEASE;  Surgeon: Wynonia Sours, MD;  Location: [facility];  Service: Orthopedics;  Laterality: Left;  .  CARPOMETACARPEL SUSPENSION PLASTY Right 11/16/2014   Procedure: SUSPENSIONPLASTY RIGHT THUMB TENDON TRANSFER ABDUCTOR POLLICUS LONGUS EXCISION TRAPEZIUM;  Surgeon: Daryll Brod, MD;  Location: [facility];  Service: Orthopedics;  Laterality: Right;  . CHOLECYSTECTOMY  1994  . CIRCUMCISION    . COLONOSCOPY N/A 03/14/2013   Procedure: COLONOSCOPY;  Surgeon: Daneil Dolin, MD;  [facility];  Service: Endoscopy;  Laterality: N/A;  8:15 AM  . EYE SURGERY     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-[facility]  . EYE SURGERY  2012   Left eye Corneal transplant- partial- Cataract  . GALLBLADDER SURGERY  12/01/2006  . HAND TENDON SURGERY Left late 1990's   thumb  . HEMORROIDECTOMY  2003  . INJECTION KNEE Left 08/26/2017   Procedure: LEFT KNEE INJECTION;  Surgeon: Elsie Saas, MD;  Location: [facility];  Service: Orthopedics;  Laterality: Left;  . left Knee Arthroscopy     [facility]  . PARTIAL KNEE ARTHROPLASTY  11/22/2012   Procedure: UNICOMPARTMENTAL KNEE;  Surgeon: Lorn Junes, MD;  Location: [facility];  Service: Orthopedics;  Laterality: Left;  left unicompartmental knee arthroplasty  . PERMANENT PACEMAKER GENERATOR CHANGE N/A 01/12/2013   Procedure: PERMANENT PACEMAKER GENERATOR CHANGE;  Surgeon: Evans Lance, MD;  Location: [facility] CATH LAB;  Service: Cardiovascular;  Laterality: N/A;  . Rotator cuff Surgery  2001   Right shoulder  . TONSILLECTOMY    . TOTAL KNEE ARTHROPLASTY Right 08/26/2017   Procedure: TOTAL KNEE ARTHROPLASTY;  Surgeon: Elsie Saas, MD;  Location: [facility];  Service: Orthopedics;  Laterality: Right;  . varicose vein reduction      Family History  Problem Relation Age of Onset  . Other Father 22       Sudden Cardiac death  . Pancreatic cancer Mother   . Colon cancer Mother   . Colon cancer Maternal Aunt   . Colon polyps Neg Hx     Social History   Socioeconomic History  . Marital status: Married    Spouse name: Not on file  .  Number of children: Not on file  . Years of education: Not on file  . Highest education level: Not on file  Occupational History  . Not on file  Social Needs  . Financial resource strain: Not on file  . Food insecurity:  Worry: Not on file    Inability: Not on file  . Transportation needs:    Medical: Not on file    Non-medical: Not on file  Tobacco Use  . Smoking status: Former Smoker    Packs/day: 1.00    Years: 25.00    Pack years: 25.00    Types: Cigarettes    Start date: 02/20/1958    Last attempt to quit: 02/12/1984    Years since quitting: 34.1  . Smokeless tobacco: Never Used  Substance and Sexual Activity  . Alcohol use: Yes    Alcohol/week: 2.4 oz    Types: 4 Glasses of wine per week  . Drug use: No  . Sexual activity: Not on file  Lifestyle  . Physical activity:    Days per week: Not on file    Minutes per session: Not on file  . Stress: Not on file  Relationships  . Social connections:    Talks on phone: Not on file    Gets together: Not on file    Attends religious service: Not on file    Active member of club or organization: Not on file    Attends meetings of clubs or organizations: Not on file    Relationship status: Not on file  . Intimate partner violence:    Fear of current or ex partner: Not on file    Emotionally abused: Not on file    Physically abused: Not on file    Forced sexual activity: Not on file  Other Topics Concern  . Not on file  Social History Narrative   Regular exercise: No    Social History   Tobacco Use  Smoking Status Former Smoker  . Packs/day: 1.00  . Years: 25.00  . Pack years: 25.00  . Types: Cigarettes  . Start date: 02/20/1958  . Last attempt to quit: 02/12/1984  . Years since quitting: 34.1  Smokeless Tobacco Never Used    Social History   Substance and Sexual Activity  Alcohol Use Yes  . Alcohol/week: 2.4 oz  . Types: 4 Glasses of wine per week     Allergies  Allergen Reactions  . Fish Allergy  Anaphylaxis and Shortness Of Breath    Only fish with iodine SEAFOOD  . Food Anaphylaxis and Shortness Of Breath    TREE NUTS  . Iodinated Diagnostic Agents Hives and Shortness Of Breath    Patient states hives to throat closing. (01/15/17: patient states this reaction was "about 20 years ago" with possibly an IVP.  He now says high doses of prednisone "throw me into AFib."  He has tolerated CT arthrograms with Benadrly 50mg  PO one hour before injection.  Brita Romp, RN)  . Shellfish Allergy Anaphylaxis    To shellfish, crabs.  Makes him feel like "things are crawling all over" me.  Denies airway issues with these foods.  Brita Romp, RN 01/15/17)  . Goat-Derived Products Hives    GOAT CHEESE GOATS  . Prednisone Palpitations    PRECIPITATES A-FIB  . Clavulanic Acid Diarrhea  . Voltaren [Diclofenac Sodium] Other (See Comments)    Feels like things are crawling on him    Current Outpatient Medications  Medication Sig Dispense Refill  . albuterol (PROVENTIL HFA;VENTOLIN HFA) 108 (90 Base) MCG/ACT inhaler Inhale 2 puffs every 4 (four) hours as needed into the lungs for shortness of breath (only if you can't catch your breath).    Marland Kitchen amLODipine (NORVASC) 10 MG tablet Take 10 mg by mouth daily.      Marland Kitchen  bisoprolol (ZEBETA) 5 MG tablet TAKE 1 AND 1/2 TABLET BY MOUTH TWICE DAILY. 90 tablet 3  . budesonide-formoterol (SYMBICORT) 160-4.5 MCG/ACT inhaler Take 2 puffs first thing in am and then another 2 puffs about 12 hours later. 1 Inhaler 11  . canagliflozin (INVOKANA) 300 MG TABS tablet Take 300 mg by mouth daily before breakfast.    . cloNIDine (CATAPRES) 0.1 MG tablet Take 0.1 mg by mouth 2 (two) times daily.    Marland Kitchen dextromethorphan (DELSYM) 30 MG/5ML liquid 2 tsp every 12 hours as needed if still coughing    . famotidine (PEPCID) 20 MG tablet Take 20 mg by mouth at bedtime.    Marland Kitchen glipiZIDE (GLUCOTROL XL) 5 MG 24 hr tablet Take 5 mg by mouth daily before breakfast.    . levothyroxine (SYNTHROID,  LEVOTHROID) 137 MCG tablet Take 137 mcg by mouth daily before breakfast.     . losartan-hydrochlorothiazide (HYZAAR) 100-25 MG per tablet Take 1 tablet by mouth daily.    . metFORMIN (GLUCOPHAGE-XR) 500 MG 24 hr tablet Take 500 mg by mouth every evening.     . montelukast (SINGULAIR) 10 MG tablet One at bedtime every night 30 tablet 2  . Multiple Vitamin (MULTIVITAMIN) tablet Take 1 tablet by mouth daily.      Marland Kitchen omeprazole (PRILOSEC OTC) 20 MG tablet Take 20 mg by mouth daily.     . rivaroxaban (XARELTO) 20 MG TABS tablet Take 20 mg daily with supper by mouth.    . simvastatin (ZOCOR) 20 MG tablet Take 20 mg by mouth daily.     No current facility-administered medications for this visit.     Pertinent items are noted in HPI.   Review of Systems:     Cardiac Review of Systems: [Y] = yes  or   [  ] = no   Chest Pain [ n   ]  Resting SOB Florencio.Farrier   ] Exertional SOB  [ y ]  Orthopnea [ n ]   Pedal Edema [ y  ]    Palpitations Blue.Reese  ] Syncope  [ years ago ]   Presyncope [n   ]  General Review of Systems: [Y] = yes [  ]=no Constitional: recent weight change [n  ];  Wt loss over the last 3 months [   ] anorexia [  ]; fatigue [  ]; nausea [  ]; night sweats [  ]; fever [  ]; or chills [  ];          Dental: poor dentition[  ]; Last Dentist visit:   Eye : blurred vision [  ]; diplopia [   ]; vision changes [  ];  Amaurosis fugax[  ]; Resp: cough [  ];  wheezing[ y ];  hemoptysis[ y ]; shortness of breath[y  ]; paroxysmal nocturnal dyspnea[n  ]; dyspnea on exertion[ y ]; or orthopnea[  ];  GI:  gallstones[n  ], vomiting[  ];  dysphagia[  ]; melena[  ];  hematochezia [ n ]; heartburn[  ];   Hx of  Colonoscopy[  ]; GU: kidney stones [  ]; hematuria[  ];   dysuria [  ];  nocturia[  ];  history of     obstruction [  ]; urinary frequency [  ]             Skin: rash, swelling[  ];, hair loss[  ];  peripheral edema[  ];  or itching[  ]; Musculosketetal: myalgias[ y ];  joint swelling[ y ];  joint erythema[  ];   joint pain[  ];  back pain[y  ];  Heme/Lymph: bruising[y  ];  bleeding[ y ];  anemia[  ];  Neuro: TIA[n  ];  headaches[ n];  stroke[ n ];  vertigo[ n ];  seizures[ n ];   paresthesias[  ];  difficulty walking[  ];  Psych:depression[n  ]; anxiety[  n];  Endocrine: diabetes[y  ];  thyroid dysfunction[y  ];  Immunizations: Flu up to date [  ]; Pneumococcal up to date [  ];  Other:  Physical Exam: BP 116/69   Pulse 79   Resp 20   Ht 5\' 10"  (1.778 m)   Wt 220 lb (99.8 kg)   SpO2 97% Comment: RA  BMI 31.57 kg/m    General appearance: alert, cooperative and appears stated age Head: Normocephalic, without obvious abnormality, atraumatic Neck: no adenopathy, no carotid bruit, no JVD, supple, symmetrical, trachea midline and thyroid not enlarged, symmetric, no tenderness/mass/nodules Lymph nodes: Cervical, supraclavicular, and axillary nodes normal. Resp: clear to auscultation bilaterally Back: symmetric, no curvature. ROM normal. No CVA tenderness. Cardio: regular rate and rhythm, S1, S2 normal, no murmur, click, rub or gallop GI: soft, non-tender; bowel sounds normal; no masses,  no organomegaly Extremities: extremities normal, atraumatic, no cyanosis , Homans sign is negative, no sign of DVT, mild edema right leg greater than left with chronic venous stasis changes in both lower extremities Neurologic: Grossly normal  Diagnostic Studies & Laboratory data:     Recent Radiology Findings:   Dg Chest 2 View  Result Date: 03/16/2018 CLINICAL DATA:  Cough and congestion. EXAM: CHEST - 2 VIEW COMPARISON:  03/02/2018. FINDINGS: Cardiac pacer with lead tips over the right atrium and right ventricle. Heart size normal. Low lung volumes. Mild basilar atelectasis. Interval near complete clearing of right base infiltrate. No pleural effusion or pneumothorax. Degenerative changes thoracic spine. IMPRESSION: 1. Cardiac pacer with lead tips over the right atrium right ventricle. 2. Low lung volumes with  mild basilar atelectasis. Interval clearing scratched it near complete interval clearing of left base infiltrate. Electronically Signed   By: Marcello Moores  Register   On: 03/16/2018 11:48   Ct Chest Wo Contrast  Result Date: 03/31/2018 CLINICAL DATA:  Hemoptysis for 10 days. Treated for pneumonia 3 weeks ago. Ex-smoker. EXAM: CT CHEST WITHOUT CONTRAST TECHNIQUE: Multidetector CT imaging of the chest was performed following the standard protocol without IV contrast. COMPARISON:  Plain film 03/16/2018.  No prior CT. FINDINGS: Cardiovascular: Advanced aortic and branch vessel atherosclerosis. dual lead pacer. Mild cardiomegaly with lipomatous hypertrophy of the interatrial septum. Mild anterior pericardial fluid or thickening is likely physiologic. Pulmonary artery enlargement, outflow tract 3.7 cm Mediastinum/Nodes: Low left jugular node measures 9 mm on image 7/2. Subcarinal adenopathy at 1.9 cm on image 80/2. Adjacent enlarged node within the azygoesophageal recess measures 1.8 cm on image 93/2. Suspect right hilar infrahilar adenopathy, including on image 101/2. Suboptimally evaluated on this noncontrast exam. Prevascular nodes measure up to 10 mm on image 62/2. Lungs/Pleura: No pleural fluid.  Moderate centrilobular emphysema. Scattered tiny pulmonary nodules, identified on series 4 including at 3 mm on image 60/4. Multifocal right greater than left lower lobe airspace disease, including on 119/4. Clustered small nodules in the left lower lobe, including on image 118/4, likely also infectious. Irregular superior segment right lower lobe 1.5 x 1.6 cm nodular density including on image 73/4. Upper Abdomen: Cholecystectomy. Normal imaged portions of the liver, spleen, stomach, pancreas,  adrenal glands, kidneys. Musculoskeletal: Moderate thoracic spondylosis, without acute focal osseous abnormality. IMPRESSION: 1. Right greater than left base airspace disease, consistent with residual or recurrent infection. 2. Irregular  superior segment right lower lobe density, suspicious for primary bronchogenic carcinoma. 3. Thoracic adenopathy. Concurrent mild low left jugular adenopathy. Although findings could be reactive, metastatic disease is favored. Consider thoracic surgery consultation for eventual PET. The PET may be of highest specificity after antibiotic therapy to allow further regression/resolution of presumed infectious opacities. 4. Pulmonary artery enlargement suggests pulmonary arterial hypertension. 5. Aortic atherosclerosis (ICD10-I70.0), coronary artery atherosclerosis and emphysema (ICD10-J43.9). 6. Nonspecific scattered bilateral pulmonary nodules. These results will be called to the ordering clinician or representative by the Radiologist Assistant, and communication documented in the PACS or zVision Dashboard. Electronically Signed   By: Abigail Miyamoto M.D.   On: 03/31/2018 09:08     I have independently reviewed the above radiologic studies.  Recent Lab Findings: Lab Results  Component Value Date   WBC 6.8 01/19/2018   HGB 17.4 (H) 01/19/2018   HCT 51.2 01/19/2018   PLT 182.0 01/19/2018   GLUCOSE 112 (H) 01/19/2018   ALT 21 10/20/2016   AST 18 10/20/2016   NA 139 01/19/2018   K 4.1 01/19/2018   CL 101 01/19/2018   CREATININE 1.03 01/19/2018   BUN 17 01/19/2018   CO2 31 01/19/2018   INR 0.91 01/10/2013   HGBA1C 7.2 (H) 08/13/2017      Assessment / Plan:   1/Right greater than left base airspace disease-could be infectious but has not responded to 2 courses of p.o. antibiotics  2/  Irregular superior segment right lower lobe density, suspicious for primary bronchogenic carcinoma.  3. Thoracic adenopathy.    Patient with chronic cough episodic hemoptysis on Xarelto with primarily right lower lobe airspace disease and right lower lobe lung density suspicious for malignancy.  To further evaluate the patient's current situation will proceed with a PET scan and use this as the guide of the most  appropriate biopsy technique to obtain a definitive diagnosis.  I discussed and reviewed the x-rays with the patient and his wife.  As soon as the PET scan has been completed we will review and make recommendations to the patient about obtaining a tissue diagnosis.  With 2 previous courses of antibiotics I would not recommend delaying any further or retreatment with p.o. Antibiotics.    Grace Isaac MD      [address] [place] [address] Office [phone]   Beeper [phone]  04/14/2018 3:55 PM

## 2018-04-21 ENCOUNTER — Telehealth: Payer: Self-pay

## 2018-04-21 NOTE — Telephone Encounter (Signed)
Patient could not remember if he was to be NPO before his PET scan tomorrow and what medications he should not take.  I advised him to make sure he has nothing to eat or drink after midnight tonight before his PET scan tomorrow.  I also stated that he should not take any medications for diabetes (Invokana or Glipizide) the morning of the procedure.  He acknowledged receipt.

## 2018-04-22 ENCOUNTER — Ambulatory Visit: Payer: Medicare Other | Admitting: Cardiothoracic Surgery

## 2018-04-22 ENCOUNTER — Ambulatory Visit (HOSPITAL_COMMUNITY)
Admission: RE | Admit: 2018-04-22 | Discharge: 2018-04-22 | Disposition: A | Discharge status: Home / Self Care | Payer: Medicare Other | Source: Ambulatory Visit | Attending: Cardiothoracic Surgery | Admitting: Cardiothoracic Surgery

## 2018-04-22 VITALS — BP 110/68 | HR 96 | Resp 20 | Ht 70.0 in | Wt 223.0 lb

## 2018-04-22 DIAGNOSIS — J439 Emphysema, unspecified: Secondary | ICD-10-CM | POA: Diagnosis not present

## 2018-04-22 DIAGNOSIS — R918 Other nonspecific abnormal finding of lung field: Secondary | ICD-10-CM | POA: Diagnosis present

## 2018-04-22 DIAGNOSIS — Z79899 Other long term (current) drug therapy: Secondary | ICD-10-CM | POA: Diagnosis not present

## 2018-04-22 LAB — GLUCOSE, CAPILLARY: Glucose-Capillary: 138 mg/dL — ABNORMAL HIGH (ref 65–99)

## 2018-04-22 MED ORDER — FLUDEOXYGLUCOSE F - 18 (FDG) INJECTION
10.9200 | Freq: Once | INTRAVENOUS | Status: AC | PRN
  Administered 2018-04-22: 10.92 via INTRAVENOUS

## 2018-04-22 NOTE — Progress Notes (Signed)
[address]       [place] [address]             [phone]                    Drey R [place] Medical Record #[id number] Date of Birth: April 27, 1946  Referring: [name], MD Primary Care: [name], MD Primary Cardiologist: No primary care provider on file.  Chief Complaint:    Chief Complaint  Patient presents with  . Lung Lesion    f/u to review PET Scan 04/22/2018    History of Present Illness:    Clarence Dawson 72 y.o. male is seen in the office  Today after a PET scan was done.  He was being evaluated for chronic cough of 6 months duration associated with episodic hemoptysis.  Chest x-ray suggested right lower lobe pneumonia.  Patient has been treated on 2 separate occasions with p.o. Antibiotics. ,  Without much improvement in his symptoms.  He has had multiple episodes of coughing up blood-tinged sputum.  He is on Xarelto for atrial fib.  While traveling in [place] on vacation he developed episode of fever and chills which spontaneously resolved.  April 4 a CT scan of the chest was done and the patient was referred to the surgical office.  PET scan was done today  His past medical history includes episodic atrial fibrillation especially when put on steroids for respiratory difficulty, has a history of chronic hypothyroidism on Synthroid since 1989.  He is a previous smoker but stopped in 1984.  In October 2005 he passed out and fell off a ladder and ultimately a permanent pacemaker was placed while living in [place].  In 2010 while in [place] he had what he describes as "map and zap".   Patient is currently diabetic treated with oral agents including Invocana  Current Activity/ Functional Status:  Patient is independent with mobility/ambulation, transfers, ADL's, IADL's.   Zubrod Score: At the time of surgery this patient's most appropriate activity status/level should be described as: []     0    Normal activity, no  symptoms [x]     1    Restricted in physical strenuous activity but ambulatory, able to do out light work []     2    Ambulatory and capable of self care, unable to do work activities, up and about               >50 % of waking hours                              []     3    Only limited self care, in bed greater than 50% of waking hours []     4    Completely disabled, no self care, confined to bed or chair []     5    Moribund   Past Medical History:  Diagnosis Date  . Allergic rhinitis   . Aortic valve disorder   . Asthma    since childhood- seasonal allergies induced  . Cancer ([place])    Skin cancer- squamous, basal  . Carotid artery stenosis   . Essential hypertension   . Full dentures   . GERD (gastroesophageal reflux disease)   . H/O hiatal hernia   . Hemorrhage of rectum   . Hyperlipidemia   . Hypothyroidism   . 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  OSA (obstructive sleep apnea)   . Osteoarthritis   . Pacemaker    Oct 2005 in [place].  Marland Kitchen PAF (paroxysmal atrial fibrillation) ([place])   . Pneumonia    "several Times" 2015 last time  . Primary localized osteoarthritis of right knee 08/11/2017  . RBBB (right bundle branch block)   . Sinoatrial node dysfunction ([facility])   . Syncope   . Tricuspid valve disorder   . Type 2 diabetes mellitus ([place])    Type II    Past Surgical History:  Procedure Laterality Date  . A-V CARDIAC PACEMAKER INSERTION     Sick sinus syndrome DDR pacer  . Arthropathy Right 2005   Rebuilding of left thumb and joint   . CARDIAC CATHETERIZATION    . CARDIAC ELECTROPHYSIOLOGY STUDY AND ABLATION  09/2008   for pvcs, Dr. Loralie Champagne  . CARDIOVERSION N/A 12/18/2016   Procedure: CARDIOVERSION;  Surgeon: Pixie Casino, MD;  Location: [facility] ENDOSCOPY;  Service: Cardiovascular;  Laterality: N/A;  . [place]   right wrist  . CARPAL TUNNEL RELEASE  05/04/2012   Procedure: CARPAL TUNNEL RELEASE;  Surgeon: Wynonia Sours, MD;  Location: [place];   Service: Orthopedics;  Laterality: Left;  . CARPOMETACARPEL SUSPENSION PLASTY Right 11/16/2014   Procedure: SUSPENSIONPLASTY RIGHT THUMB TENDON TRANSFER ABDUCTOR POLLICUS LONGUS EXCISION TRAPEZIUM;  Surgeon: Daryll Brod, MD;  Location: [facility];  Service: Orthopedics;  Laterality: Right;  . CHOLECYSTECTOMY  1994  . CIRCUMCISION    . COLONOSCOPY N/A 03/14/2013   Procedure: COLONOSCOPY;  Surgeon: Daneil Dolin, MD;  [facility];  Service: Endoscopy;  Laterality: N/A;  8:15 AM  . 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 EYE SURGERY  2012   Left eye Corneal transplant- partial- Cataract  . GALLBLADDER SURGERY  12/01/2006  . HAND TENDON SURGERY Left late 1990's   thumb  . HEMORROIDECTOMY  2003  . INJECTION KNEE Left 08/26/2017   Procedure: LEFT KNEE INJECTION;  Surgeon: Elsie Saas, MD;  Location: [place];  Service: Orthopedics;  Laterality: Left;  . left Knee Arthroscopy     [facility]  . PARTIAL KNEE ARTHROPLASTY  11/22/2012   Procedure: UNICOMPARTMENTAL KNEE;  Surgeon: Lorn Junes, MD;  Location: [facility];  Service: Orthopedics;  Laterality: Left;  left unicompartmental knee arthroplasty  . PERMANENT PACEMAKER GENERATOR CHANGE N/A 01/12/2013   Procedure: PERMANENT PACEMAKER GENERATOR CHANGE;  Surgeon: Evans Lance, MD;  Location: [facility] CATH LAB;  Service: Cardiovascular;  Laterality: N/A;  . Rotator cuff Surgery  2001   Right shoulder  . TONSILLECTOMY    . TOTAL KNEE ARTHROPLASTY Right 08/26/2017   Procedure: TOTAL KNEE ARTHROPLASTY;  Surgeon: Elsie Saas, MD;  Location: [facility];  Service: Orthopedics;  Laterality: Right;  . varicose vein reduction      Family History  Problem Relation Age of Onset  . Other Father 39       Sudden Cardiac death  . Pancreatic cancer Mother   . Colon cancer Mother   . Colon cancer Maternal Aunt   . Colon polyps Neg Hx     Social History   Socioeconomic History  . Marital  status: Married    Spouse name: Not on file  . Number of children: Not on file  . Years of education: Not on file  . Highest education level: Not on file  Occupational History  . Not on file  Social Needs  . Financial resource strain: Not on file  . Food insecurity:    Worry: Not on file    Inability: Not on file  . Transportation needs:    Medical: Not on file    Non-medical: Not on file  Tobacco Use  . Smoking status: Former Smoker    Packs/day: 1.00    Years: 25.00    Pack years: 25.00    Types: Cigarettes    Start date: 02/20/1958    Last attempt to quit: 02/12/1984    Years since quitting: 34.2  . Smokeless tobacco: Never Used  Substance and Sexual Activity  . Alcohol use: Yes    Alcohol/week: 2.4 oz    Types: 4 Glasses of wine per week  . Drug use: No  . Sexual activity: Not on file  Lifestyle  . Physical activity:    Days per week: Not on file    Minutes per session: Not on file  . Stress: Not on file  Relationships  . Social connections:    Talks on phone: Not on file    Gets together: Not on file    Attends religious service: Not on file    Active member of club or organization: Not on file    Attends meetings of clubs or organizations: Not on file    Relationship status: Not on file  . Intimate partner violence:    Fear of current or ex partner: Not on file    Emotionally abused: Not on file    Physically abused: Not on file    Forced sexual activity: Not on file  Other Topics Concern  . Not on file  Social History Narrative   Regular exercise: No    Social History   Tobacco Use  Smoking Status Former Smoker  . Packs/day: 1.00  . Years: 25.00  . Pack years: 25.00  . Types: Cigarettes  . Start date: 02/20/1958  . Last attempt to quit: 02/12/1984  . Years since quitting: 34.2  Smokeless Tobacco Never Used    Social History   Substance and Sexual Activity  Alcohol Use Yes  . Alcohol/week: 2.4 oz  . Types: 4 Glasses of wine per week      Allergies  Allergen Reactions  . Fish Allergy Anaphylaxis and Shortness Of Breath    Only fish with iodine SEAFOOD  . Food Anaphylaxis and Shortness Of Breath    TREE NUTS  . Iodinated Diagnostic Agents Hives and Shortness Of Breath    Patient states hives to throat closing. (01/15/17: patient states this reaction was "about 20 years ago" with possibly an IVP.  He now says high doses of prednisone "throw me into AFib."  He has tolerated CT arthrograms with Benadrly 50mg  PO one hour before injection.  Brita Romp, RN)  . Shellfish Allergy Anaphylaxis    To shellfish, crabs.  Makes him feel like "things are crawling all over" me.  Denies airway issues with these foods.  Brita Romp, RN 01/15/17)  . Goat-Derived Products Hives    GOAT CHEESE GOATS  . Prednisone Palpitations    PRECIPITATES A-FIB  . Clavulanic Acid Diarrhea  . Voltaren [Diclofenac Sodium] Other (See Comments)    Feels like things are crawling on him    Current Outpatient Medications  Medication Sig Dispense Refill  . albuterol (PROVENTIL HFA;VENTOLIN HFA) 108 (90 Base) MCG/ACT inhaler Inhale 2 puffs every 4 (four) hours as needed into the lungs for shortness of breath (only if you can't catch  your breath).    Marland Kitchen amLODipine (NORVASC) 10 MG tablet Take 10 mg by mouth daily.      . bisoprolol (ZEBETA) 5 MG tablet TAKE 1 AND 1/2 TABLET BY MOUTH TWICE DAILY. 90 tablet 3  . budesonide-formoterol (SYMBICORT) 160-4.5 MCG/ACT inhaler Take 2 puffs first thing in am and then another 2 puffs about 12 hours later. 1 Inhaler 11  . canagliflozin (INVOKANA) 300 MG TABS tablet Take 300 mg by mouth daily before breakfast.    . cloNIDine (CATAPRES) 0.1 MG tablet Take 0.1 mg by mouth 2 (two) times daily.    Marland Kitchen dextromethorphan (DELSYM) 30 MG/5ML liquid 2 tsp every 12 hours as needed if still coughing    . famotidine (PEPCID) 20 MG tablet Take 20 mg by mouth at bedtime.    Marland Kitchen glipiZIDE (GLUCOTROL XL) 5 MG 24 hr tablet Take 5 mg by mouth  daily before breakfast.    . levothyroxine (SYNTHROID, LEVOTHROID) 137 MCG tablet Take 137 mcg by mouth daily before breakfast.     . losartan-hydrochlorothiazide (HYZAAR) 100-25 MG per tablet Take 1 tablet by mouth daily.    . metFORMIN (GLUCOPHAGE-XR) 500 MG 24 hr tablet Take 500 mg by mouth every evening.     . montelukast (SINGULAIR) 10 MG tablet One at bedtime every night 30 tablet 2  . Multiple Vitamin (MULTIVITAMIN) tablet Take 1 tablet by mouth daily.      Marland Kitchen omeprazole (PRILOSEC OTC) 20 MG tablet Take 20 mg by mouth daily.     . rivaroxaban (XARELTO) 20 MG TABS tablet Take 20 mg daily with supper by mouth.    . simvastatin (ZOCOR) 20 MG tablet Take 20 mg by mouth daily.     No current facility-administered medications for this visit.     Pertinent items are noted in HPI.   Review of Systems:  Review of Systems  Constitutional: Positive for malaise/fatigue. Negative for chills, diaphoresis, fever and weight loss.  HENT: Positive for congestion. Negative for sinus pain and sore throat.   Eyes: Negative.   Respiratory: Positive for cough, hemoptysis, sputum production, shortness of breath and wheezing. Negative for stridor.   Cardiovascular: Negative for chest pain, palpitations, orthopnea, claudication, leg swelling and PND.  Gastrointestinal: Negative.   Genitourinary: Negative.   Musculoskeletal: Negative.   Skin: Negative.   Neurological: Negative.   Endo/Heme/Allergies: Negative.   Psychiatric/Behavioral: Negative.        Immunizations: Flu up to date Blue.Reese  ]; Pneumococcal up to date Blue.Reese  ];   Physical Exam: BP 110/68   Pulse 96   Resp 20   Ht 5\' 10"  (1.778 m)   Wt 223 lb (101.2 kg)   BMI 32.00 kg/m    General appearance: alert, cooperative and appears stated age Head: Normocephalic, without obvious abnormality, atraumatic Neck: no adenopathy, no carotid bruit, no JVD, supple, symmetrical, trachea midline and thyroid not enlarged, symmetric, no  tenderness/mass/nodules Lymph nodes: Cervical, supraclavicular, and axillary nodes normal. Resp: clear to auscultation bilaterally Back: symmetric, no curvature. ROM normal. No CVA tenderness. Cardio: regular rate and rhythm, S1, S2 normal, no murmur, click, rub or gallop GI: soft, non-tender; bowel sounds normal; no masses,  no organomegaly Extremities: extremities normal, atraumatic, no cyanosis , Homans sign is negative, no sign of DVT, mild edema right leg greater than left with chronic venous stasis changes in both lower extremities Neurologic: Grossly normal Exam is unchanged from last week  Diagnostic Studies & Laboratory data:     Recent Radiology Findings:  Nm Pet Image Initial (pi) Skull Base To Thigh  Result Date: 04/22/2018 CLINICAL DATA:  Initial treatment strategy for superior segment right lower lobe pulmonary lesion. EXAM: NUCLEAR MEDICINE PET SKULL BASE TO THIGH TECHNIQUE: 10.92 mCi F-18 FDG was injected intravenously. Full-ring PET imaging was performed from the skull base to thigh after the radiotracer. CT data was obtained and used for attenuation correction and anatomic localization. Fasting blood glucose: 138 mg/dl COMPARISON:  Chest CT 03/30/2018 FINDINGS: Mediastinal blood pool activity: SUV max 2.71 NECK: No hypermetabolic lymph nodes in the neck. Incidental CT findings: none CHEST: The right lower lobe pulmonary lesion has near completely resolved on the CT scan. There is some minimal residual vague airspace opacity and significant breathing motion artifact. No hypermetabolism is demonstrated on the PET scan to suggest a neoplastic process. This is likely a resolving infiltrate. I would recommend a follow-up noncontrast chest CT scan in 3 months to reassess. Numerous borderline mediastinal and hilar lymph nodes are again noted. No significant hypermetabolism. The largest node in the subcarinal region on the right side measures 17.5 mm and now measures 16 mm on image number  94. SUV max is 3.16. Incidental CT findings: Stable emphysematous changes and pulmonary scarring. Bibasilar atelectasis. ABDOMEN/PELVIS: No abnormal hypermetabolic activity within the liver, pancreas, adrenal glands, or spleen. No hypermetabolic lymph nodes in the abdomen or pelvis. Incidental CT findings: Advanced atherosclerotic calcifications involving the abdominal aorta and branch vessels. Small operator lymph nodes are noted in the pelvis but no hypermetabolism. SKELETON: No focal hypermetabolic activity to suggest skeletal metastasis. Incidental CT findings: none IMPRESSION: 1. Near complete resolution of the right upper lobe pulmonary lesion. No hypermetabolism to suggest neoplastic process. This is likely a resolving infiltrate. Recommend follow-up noncontrast chest CT in 3 months. 2. Stable borderline mediastinal and hilar lymph nodes likely related to the patient's emphysema. The largest subcarinal node has decreased in size slightly in demonstrates mild hypermetabolism. This is likely inflammatory/hyperplastic. 3. No significant findings in the abdomen or pelvis. Electronically Signed   By: Marijo Sanes M.D.   On: 04/22/2018 13:54    On reviewing the above report impression says upper lobe the body says lower lobe, when reviewing the study the lesion of concern is in the right lower lobe. I have independently reviewed the above radiology studies  and reviewed the findings with the patient.    Dg Chest 2 View  Result Date: 03/16/2018 CLINICAL DATA:  Cough and congestion. EXAM: CHEST - 2 VIEW COMPARISON:  03/02/2018. FINDINGS: Cardiac pacer with lead tips over the right atrium and right ventricle. Heart size normal. Low lung volumes. Mild basilar atelectasis. Interval near complete clearing of right base infiltrate. No pleural effusion or pneumothorax. Degenerative changes thoracic spine. IMPRESSION: 1. Cardiac pacer with lead tips over the right atrium right ventricle. 2. Low lung volumes with  mild basilar atelectasis. Interval clearing scratched it near complete interval clearing of left base infiltrate. Electronically Signed   By: Marcello Moores  Register   On: 03/16/2018 11:48   Ct Chest Wo Contrast  Result Date: 03/31/2018 CLINICAL DATA:  Hemoptysis for 10 days. Treated for pneumonia 3 weeks ago. Ex-smoker. EXAM: CT CHEST WITHOUT CONTRAST TECHNIQUE: Multidetector CT imaging of the chest was performed following the standard protocol without IV contrast. COMPARISON:  Plain film 03/16/2018.  No prior CT. FINDINGS: Cardiovascular: Advanced aortic and branch vessel atherosclerosis. dual lead pacer. Mild cardiomegaly with lipomatous hypertrophy of the interatrial septum. Mild anterior pericardial fluid or thickening is likely physiologic. Pulmonary  artery enlargement, outflow tract 3.7 cm Mediastinum/Nodes: Low left jugular node measures 9 mm on image 7/2. Subcarinal adenopathy at 1.9 cm on image 80/2. Adjacent enlarged node within the azygoesophageal recess measures 1.8 cm on image 93/2. Suspect right hilar infrahilar adenopathy, including on image 101/2. Suboptimally evaluated on this noncontrast exam. Prevascular nodes measure up to 10 mm on image 62/2. Lungs/Pleura: No pleural fluid.  Moderate centrilobular emphysema. Scattered tiny pulmonary nodules, identified on series 4 including at 3 mm on image 60/4. Multifocal right greater than left lower lobe airspace disease, including on 119/4. Clustered small nodules in the left lower lobe, including on image 118/4, likely also infectious. Irregular superior segment right lower lobe 1.5 x 1.6 cm nodular density including on image 73/4. Upper Abdomen: Cholecystectomy. Normal imaged portions of the liver, spleen, stomach, pancreas, adrenal glands, kidneys. Musculoskeletal: Moderate thoracic spondylosis, without acute focal osseous abnormality. IMPRESSION: 1. Right greater than left base airspace disease, consistent with residual or recurrent infection. 2. Irregular  superior segment right lower lobe density, suspicious for primary bronchogenic carcinoma. 3. Thoracic adenopathy. Concurrent mild low left jugular adenopathy. Although findings could be reactive, metastatic disease is favored. Consider thoracic surgery consultation for eventual PET. The PET may be of highest specificity after antibiotic therapy to allow further regression/resolution of presumed infectious opacities. 4. Pulmonary artery enlargement suggests pulmonary arterial hypertension. 5. Aortic atherosclerosis (ICD10-I70.0), coronary artery atherosclerosis and emphysema (ICD10-J43.9). 6. Nonspecific scattered bilateral pulmonary nodules. These results will be called to the ordering clinician or representative by the Radiologist Assistant, and communication documented in the PACS or zVision Dashboard. Electronically Signed   By: Abigail Miyamoto M.D.   On: 03/31/2018 09:08     I have independently reviewed the above radiologic studies.  Recent Lab Findings: Lab Results  Component Value Date   WBC 6.8 01/19/2018   HGB 17.4 (H) 01/19/2018   HCT 51.2 01/19/2018   PLT 182.0 01/19/2018   GLUCOSE 112 (H) 01/19/2018   ALT 21 10/20/2016   AST 18 10/20/2016   NA 139 01/19/2018   K 4.1 01/19/2018   CL 101 01/19/2018   CREATININE 1.03 01/19/2018   BUN 17 01/19/2018   CO2 31 01/19/2018   INR 0.91 01/10/2013   HGBA1C 7.2 (H) 08/13/2017      Assessment / Plan:     1/Right greater than left base airspace disease-could be infectious but has not responded to 2 courses of p.o. Antibiotics-now appears to be continuing to resolve on PET scan/CT scan Will obtain follow-up CT scan in 3 months  I  spent 20 minutes with  the patient face to face and greater then 50% of the time was spent in counseling and coordination of care.    Grace Isaac MD      [address] [place] [address] Office [phone]   Beeper [phone]  04/22/2018 2:19 PM

## 2018-04-28 ENCOUNTER — Encounter: Payer: Self-pay | Admitting: Internal Medicine

## 2018-05-03 ENCOUNTER — Ambulatory Visit (INDEPENDENT_AMBULATORY_CARE_PROVIDER_SITE_OTHER): Payer: Medicare Other | Admitting: *Deleted

## 2018-05-03 DIAGNOSIS — I495 Sick sinus syndrome: Secondary | ICD-10-CM

## 2018-05-03 NOTE — Progress Notes (Signed)
Remote pacemaker transmission.   

## 2018-05-04 ENCOUNTER — Other Ambulatory Visit: Payer: Self-pay | Admitting: Internal Medicine

## 2018-05-04 DIAGNOSIS — J45991 Cough variant asthma: Secondary | ICD-10-CM

## 2018-05-04 MED ORDER — MONTELUKAST SODIUM 10 MG PO TABS: ORAL_TABLET | ORAL | 2 refills | Status: DC

## 2018-05-05 ENCOUNTER — Encounter: Payer: Self-pay | Admitting: Cardiology

## 2018-05-05 NOTE — Progress Notes (Signed)
* [facility]     Assessment and Plan:  Severe persistent asthma with severe chronic, intractable cough. - Patient has been tried on numerous medications over the years with persistent symptoms.  He continues to have excess mucus production, persistent wheezing on exam today, which has persisted for several months.  Patient has persistent nocturnal symptoms nightly.  He is not able to take prednisone.  He remains at high risk of exacerbations and hospitalization. - Discussed trial of Xolair, given mildly elevated IgE in the past, as well as positive Rast testing (including positive allergy to Aspergillus).  Eosinophil count was 200, therefore would also qualify for trial of Fasenra.  He is amenable to trying Xolair, therefore will initiate insurance authorization. - We will also consider allergy referral for allergy shots.  Lung mass. - Patient had a right mid zone lung nodule, as well as a right lower lobe lung mass/nodule infiltrate.  The mid zone nodule appeared to have disappeared, the right lower lobe area of infiltrate appears to have persisted, though PET scan did not appear to light up in that area.  We will repeat CT chest in approximately 3 months time if not resolving further we will plan for potential bronchoscopy/ENB as well as EBUS.  Atrial fibrillation. - Has been flared up in the past due to prednisone. -Patient currently on Xarelto.  Obstructive sleep apnea. - Patient was previously diagnosed with sleep apnea, was intolerant to CPAP, therefore stopped.   Date: 05/06/2018  MRN# [id number] Clarence Dawson 1946-09-10   Clarence Dawson is a 72 y.o. old male seen in follow up for chief complaint of  Chief Complaint  Patient presents with  . 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 Pt had pneumonia and has never recoverd.  . Wheezing    with gurgles/rattles. He doesn't really feel sob.Pt scan on 04/22/18     HPI:    The patient is a 72 year old male, has seen Dr. Leonides Schanz in the past and  pulmonary in [place] for chronic cough.  Initially had seen Dr. Melvyn Novas in January 2017 and on and off since that time with chronic recurrent cough.  He has been tried empirically on PPI, Zyrtec, Mucinex, Symbicort, Pepcid, Delsym, tramadol, chlorpheniramine, Tessalon with variable to minimal relief. He has taken singulair which has not helped. The past 2 times he was tried on prednisone he went into afib (last times was around summer of '18) Delsym and loratadine seem to help a bit.   He notes that he has been coughing more since the end of 2018. He was on advair, changed to symbicort at that time, he feels the cough is worse since then. About 2 months ago [place] was in [place], he had a fever and cough was worse, this was followed by development of blood in sputum which lasted for one week. The blood went away but the cough continues. He saw his PCP, and was sent for a CT chest. Which showed 2 right lung nodules. Subsequent PET scan showed resolution of the right mid zone nodule, persistence of the RLL nodule.   The hemoptysis has resolved, he continues to have severe cough which is making hard for him to sleep.  He takes tylenol PM to help him sleep.  He has 3 horses and 2 barn cats, [place]   He has a history of OSA, but stopped using it because it was noisy  and hated it. He also has trouble tolerating it because of coughing.   - Allergy profile 03/11/2016 >  Eos 0.2 /  IgE  70 Pos RAST Mold > dust > cockroach   - Sinus CT 03/14/16 > Mild inflammatory changes s acute change Spirometry 02/16/2018  FEV1 2.26 (70%)  Ratio 60   - 02/16/2018  After extensive coaching inhaler device  effectiveness =    90% > continue symb 160/ add singulair  Images personally reviewed, CT chest 03/30/2018; as well as PET scan emphysema, worst in the apices, area of masslike infiltrate in the posterior medial basilar segment, there is mild  subcarinal lymphadenopathy.  The right mid zone nodule seen on initial CT chest has resolved, the masslike infiltrate in the right lower lobe persists.  Medication:    Current Outpatient Medications:  .  albuterol (PROVENTIL HFA;VENTOLIN HFA) 108 (90 Base) MCG/ACT inhaler, Inhale 2 puffs every 4 (four) hours as needed into the lungs for shortness of breath (only if you can't catch your breath)., Disp: , Rfl:  .  amLODipine (NORVASC) 10 MG tablet, Take 10 mg by mouth daily.  , Disp: , Rfl:  .  bisoprolol (ZEBETA) 5 MG tablet, TAKE 1 AND 1/2 TABLET BY MOUTH TWICE DAILY., Disp: 90 tablet, Rfl: 3 .  budesonide-formoterol (SYMBICORT) 160-4.5 MCG/ACT inhaler, Take 2 puffs first thing in am and then another 2 puffs about 12 hours later., Disp: 1 Inhaler, Rfl: 11 .  canagliflozin (INVOKANA) 300 MG TABS tablet, Take 300 mg by mouth daily before breakfast., Disp: , Rfl:  .  cloNIDine (CATAPRES) 0.1 MG tablet, Take 0.1 mg by mouth 2 (two) times daily., Disp: , Rfl:  .  dextromethorphan (DELSYM) 30 MG/5ML liquid, 2 tsp every 12 hours as needed if still coughing, Disp: , Rfl:  .  famotidine (PEPCID) 20 MG tablet, Take 20 mg by mouth at bedtime., Disp: , Rfl:  .  glipiZIDE (GLUCOTROL XL) 5 MG 24 hr tablet, Take 5 mg by mouth daily before breakfast., Disp: , Rfl:  .  levothyroxine (SYNTHROID, LEVOTHROID) 137 MCG tablet, Take 137 mcg by mouth daily before breakfast. , Disp: , Rfl:  .  losartan-hydrochlorothiazide (HYZAAR) 100-25 MG per tablet, Take 1 tablet by mouth daily., Disp: , Rfl:  .  metFORMIN (GLUCOPHAGE-XR) 500 MG 24 hr tablet, Take 500 mg by mouth every evening. , Disp: , Rfl:  .  montelukast (SINGULAIR) 10 MG tablet, One at bedtime every night, Disp: 30 tablet, Rfl: 2 .  Multiple Vitamin (MULTIVITAMIN) tablet, Take 1 tablet by mouth daily.  , Disp: , Rfl:  .  omeprazole (PRILOSEC OTC) 20 MG tablet, Take 20 mg by mouth daily. , Disp: , Rfl:  .  rivaroxaban (XARELTO) 20 MG TABS tablet, Take 20 mg daily  with supper by mouth., Disp: , Rfl:  .  simvastatin (ZOCOR) 20 MG tablet, Take 20 mg by mouth daily., Disp: , Rfl:    Allergies:  Fish allergy; Food; Iodinated diagnostic agents; Shellfish allergy; Goat-derived products; Prednisone; Clavulanic acid; and Voltaren [diclofenac sodium]  Review of Systems: Gen:  Denies  fever, sweats. HEENT: Denies blurred vision. Cvc:  No dizziness, chest pain or heaviness Resp:   Denies cough or sputum porduction. Gi: Denies swallowing difficulty, stomach pain. constipation, bowel incontinence Gu:  Denies bladder incontinence, burning urine Ext:   No Joint pain, stiffness. Skin: No skin rash, easy bruising. Endoc:  No polyuria, polydipsia. Psych: No depression, insomnia. Other:  All other systems were reviewed and found  to be negative other than what is mentioned in the HPI.   Physical Examination:   VS: BP 126/76 (BP Location: Left Arm, Cuff Size: Normal)   Pulse 79   Resp 16   Ht 5\' 10"  (1.778 m)   Wt 225 lb (102.1 kg)   SpO2 92%   BMI 32.28 kg/m    General Appearance: No distress  Neuro:without focal findings,  speech normal,  HEENT: PERRLA, EOM intact. Pulmonary: normal breath sounds, No wheezing.   CardiovascularNormal S1,S2.  No m/r/g.   Abdomen: Benign, Soft, non-tender. Renal:  No costovertebral tenderness  GU:  Not performed at this time. Endoc: No evident thyromegaly, no signs of acromegaly. Skin:   warm, no rash. Extremities: normal, no cyanosis, clubbing.   LABORATORY PANEL:   CBC No results for input(s): WBC, HGB, HCT, PLT in the last 168 hours. ------------------------------------------------------------------------------------------------------------------  Chemistries  No results for input(s): NA, K, CL, CO2, GLUCOSE, BUN, CREATININE, CALCIUM, MG, AST, ALT, ALKPHOS, BILITOT in the last 168 hours.  Invalid input(s):  GFRCGP ------------------------------------------------------------------------------------------------------------------  Cardiac Enzymes No results for input(s): TROPONINI in the last 168 hours. ------------------------------------------------------------  RADIOLOGY:   No results found for this or any previous visit. Results for orders placed during the hospital encounter of 03/16/18  DG Chest 2 View   Narrative CLINICAL DATA:  Cough and congestion.  EXAM: CHEST - 2 VIEW  COMPARISON:  03/02/2018.  FINDINGS: Cardiac pacer with lead tips over the right atrium and right ventricle. Heart size normal. Low lung volumes. Mild basilar atelectasis. Interval near complete clearing of right base infiltrate. No pleural effusion or pneumothorax. Degenerative changes thoracic spine.  IMPRESSION: 1. Cardiac pacer with lead tips over the right atrium right ventricle.  2. Low lung volumes with mild basilar atelectasis. Interval clearing scratched it near complete interval clearing of left base infiltrate.   Electronically Signed   By: Marcello Moores  Register   On: 03/16/2018 11:48    ------------------------------------------------------------------------------------------------------------------  Thank  you for allowing [facility] to assist in the care of your patient. Our recommendations are noted above.  Please contact us if we can be of further service.   Marda Stalker, MD.  [facility] Office Number: [phone]  Patricia Pesa, M.D.  Merton Border, M.D  05/06/2018

## 2018-05-06 ENCOUNTER — Encounter: Payer: Self-pay | Admitting: Internal Medicine

## 2018-05-06 ENCOUNTER — Ambulatory Visit: Payer: Medicare Other | Admitting: Internal Medicine

## 2018-05-06 VITALS — BP 126/76 | HR 79 | Resp 16 | Ht 70.0 in | Wt 225.0 lb

## 2018-05-06 DIAGNOSIS — I4819 Other persistent atrial fibrillation: Secondary | ICD-10-CM

## 2018-05-06 DIAGNOSIS — I481 Persistent atrial fibrillation: Secondary | ICD-10-CM

## 2018-05-06 DIAGNOSIS — R918 Other nonspecific abnormal finding of lung field: Secondary | ICD-10-CM

## 2018-05-06 DIAGNOSIS — G4733 Obstructive sleep apnea (adult) (pediatric): Secondary | ICD-10-CM | POA: Diagnosis not present

## 2018-05-06 DIAGNOSIS — C3490 Malignant neoplasm of unspecified part of unspecified bronchus or lung: Secondary | ICD-10-CM

## 2018-05-06 DIAGNOSIS — J4531 Mild persistent asthma with (acute) exacerbation: Secondary | ICD-10-CM

## 2018-05-06 MED ORDER — FLUTICASONE-SALMETEROL 500-50 MCG/DOSE IN AEPB: 1.0000 | INHALATION_SPRAY | Freq: Two times a day (BID) | RESPIRATORY_TRACT | 5 refills | Status: DC

## 2018-05-06 NOTE — Patient Instructions (Addendum)
Will switch symbicort to advair.   Will repeat Ct chest in 2 months.   Will evaluate for injectable medicine for asthma (xolair).

## 2018-05-06 NOTE — Addendum Note (Signed)
Addended by: Stephanie Coup on: 05/06/2018 10:46 AM   Modules accepted: Orders

## 2018-05-17 LAB — CUP PACEART REMOTE DEVICE CHECK
Battery Remaining Longevity: 90 mo
Battery Remaining Percentage: 91 %
Brady Statistic AP VS Percent: 1 %
Brady Statistic AS VP Percent: 5.5 %
Brady Statistic RA Percent Paced: 87 %
Brady Statistic RV Percent Paced: 92 %
Implantable Lead Implant Date: 20051106
Implantable Lead Location: 753859
Implantable Pulse Generator Implant Date: 20140115
Lead Channel Impedance Value: 380 Ohm
Lead Channel Pacing Threshold Pulse Width: 0.4 ms
Lead Channel Sensing Intrinsic Amplitude: 2.7 mV
Lead Channel Setting Pacing Amplitude: 2.5 V
Lead Channel Setting Sensing Sensitivity: 2 mV
MDC IDC LEAD IMPLANT DT: 20051106
MDC IDC LEAD LOCATION: 753860
MDC IDC MSMT BATTERY VOLTAGE: 2.95 V
MDC IDC MSMT LEADCHNL RA PACING THRESHOLD AMPLITUDE: 0.75 V
MDC IDC MSMT LEADCHNL RV IMPEDANCE VALUE: 730 Ohm
MDC IDC MSMT LEADCHNL RV PACING THRESHOLD AMPLITUDE: 1 V
MDC IDC MSMT LEADCHNL RV PACING THRESHOLD PULSEWIDTH: 0.4 ms
MDC IDC MSMT LEADCHNL RV SENSING INTR AMPL: 10.8 mV
MDC IDC PG SERIAL: 7439597
MDC IDC SESS DTM: 20190506063455
MDC IDC SET LEADCHNL RA PACING AMPLITUDE: 2 V
MDC IDC SET LEADCHNL RV PACING PULSEWIDTH: 0.4 ms
MDC IDC STAT BRADY AP VP PERCENT: 86 %
MDC IDC STAT BRADY AS VS PERCENT: 6.7 %

## 2018-05-18 ENCOUNTER — Telehealth: Payer: Self-pay | Admitting: Internal Medicine

## 2018-05-18 NOTE — Telephone Encounter (Signed)
Clarence Dawson with xolair [employer] calling to get an update on PA  xolair   Would like a call back on this

## 2018-05-18 NOTE — Telephone Encounter (Signed)
Called [employer] and made aware that PA is pending. Nothing further needed at this time.

## 2018-05-18 NOTE — Telephone Encounter (Signed)
PA pending Collie Siad Key: [id number] - PA Case ID: [id number]

## 2018-05-20 NOTE — Telephone Encounter (Signed)
Clarence Dawson with xolair [employer] calling stating while patient is pending for PA  Patient qualifies for [employer] for Xolair  Wanted to know if it was okay to send out to patient  Please call back

## 2018-05-20 NOTE — Telephone Encounter (Signed)
Returned call Xolair has been approved. Pt will be withdrawn from free starter program. Nothing further needed.

## 2018-05-20 NOTE — Telephone Encounter (Signed)
Clarence Dawson returning call. Please call.

## 2018-05-20 NOTE — Telephone Encounter (Signed)
Left detailed message giving verbal ok to send patient starter Xolair kit.

## 2018-05-27 ENCOUNTER — Telehealth: Payer: Self-pay | Admitting: Internal Medicine

## 2018-05-27 NOTE — Telephone Encounter (Signed)
Called patient, unable to reach. Left message to give [place] a call back.   Will route this message to [facility] to follow up on.

## 2018-06-07 ENCOUNTER — Telehealth: Payer: Self-pay | Admitting: Internal Medicine

## 2018-06-07 MED ORDER — EPINEPHRINE 0.3 MG/0.3ML IJ SOAJ: 0.3000 mg | Freq: Once | INTRAMUSCULAR | 11 refills | Status: AC

## 2018-06-07 NOTE — Telephone Encounter (Signed)
Patient notified

## 2018-06-07 NOTE — Telephone Encounter (Signed)
Tammy can you please provide an update on this?

## 2018-06-07 NOTE — Telephone Encounter (Signed)
[  06/07/2018 11:21 AM]  Nicki Reaper, Tammy:   He was just approved, Katie or I will call Mr. Vanderloop today.

## 2018-06-07 NOTE — Telephone Encounter (Signed)
Patient calling to check status of injections. Please  Call.

## 2018-06-07 NOTE — Telephone Encounter (Signed)
Prefilled Syringe: #150mg  2  #75mg  0 Ordered Date: 06/07/18 Shipping Date: 06/08/18

## 2018-06-09 NOTE — Telephone Encounter (Signed)
Prefilled Syringes: # 150mg  2  #75mg  0 Arrival Date: 06/09/18 Lot #: 150mg  [id number]      75mg  0  Exp Date: 150mg  12/2018   75mg  0

## 2018-06-10 ENCOUNTER — Telehealth: Payer: Self-pay | Admitting: Internal Medicine

## 2018-06-10 ENCOUNTER — Ambulatory Visit (INDEPENDENT_AMBULATORY_CARE_PROVIDER_SITE_OTHER): Payer: Medicare Other

## 2018-06-10 DIAGNOSIS — J4540 Moderate persistent asthma, uncomplicated: Secondary | ICD-10-CM

## 2018-06-10 DIAGNOSIS — J45991 Cough variant asthma: Secondary | ICD-10-CM

## 2018-06-10 NOTE — Telephone Encounter (Signed)
Monitoring pt. For two hrs. 40 mins in pt had pinhead size reactions at both site. ( RA) 20 mins after that he was itching, but he said it was because he was bored with nothing else to do. When I went to check on him 20 mins later he was itching very little.The last 20 mins. When I asked him about the itching " No more than usual."  The very small local reactions went away. Pt has already made his appt. For next month, I let him go and told him we would see him next month. Nothing further needed.

## 2018-06-11 MED ORDER — OMALIZUMAB 150 MG ~~LOC~~ SOLR: 150.0000 mg | SUBCUTANEOUS | Status: DC

## 2018-06-11 MED ORDER — OMALIZUMAB 150 MG ~~LOC~~ SOLR
300.0000 mg | SUBCUTANEOUS | Status: DC
  Administered 2018-06-11: 300 mg via SUBCUTANEOUS

## 2018-06-11 NOTE — Progress Notes (Signed)
Documentation of medication administration and charges of Xolair have been completed by Zarius Furr, CMA based on the Xolair documentation sheet completed by Tammy Scott.  

## 2018-06-17 ENCOUNTER — Ambulatory Visit: Payer: Medicare Other | Admitting: Internal Medicine

## 2018-06-17 ENCOUNTER — Other Ambulatory Visit: Payer: Self-pay | Admitting: Cardiothoracic Surgery

## 2018-06-21 ENCOUNTER — Other Ambulatory Visit (HOSPITAL_COMMUNITY)
Admission: RE | Admit: 2018-06-21 | Discharge: 2018-06-21 | Disposition: A | Discharge status: Home / Self Care | Payer: Medicare Other | Source: Ambulatory Visit | Attending: Internal Medicine | Admitting: Internal Medicine

## 2018-06-21 DIAGNOSIS — J4531 Mild persistent asthma with (acute) exacerbation: Secondary | ICD-10-CM | POA: Insufficient documentation

## 2018-06-21 LAB — BASIC METABOLIC PANEL
Anion gap: 8 (ref 5–15)
BUN: 25 mg/dL — ABNORMAL HIGH (ref 6–20)
CALCIUM: 9.4 mg/dL (ref 8.9–10.3)
CO2: 29 mmol/L (ref 22–32)
Chloride: 104 mmol/L (ref 101–111)
Creatinine, Ser: 1.1 mg/dL (ref 0.61–1.24)
GFR calc Af Amer: >60 mL/min (ref >60)
GLUCOSE: 130 mg/dL — AB (ref 65–99)
Potassium: 4.1 mmol/L (ref 3.5–5.1)
Sodium: 141 mmol/L (ref 135–145)

## 2018-06-23 ENCOUNTER — Ambulatory Visit (HOSPITAL_COMMUNITY)
Admission: RE | Admit: 2018-06-23 | Discharge: 2018-06-23 | Disposition: A | Discharge status: Home / Self Care | Payer: Medicare Other | Source: Ambulatory Visit | Attending: Internal Medicine | Admitting: Internal Medicine

## 2018-06-23 DIAGNOSIS — C3490 Malignant neoplasm of unspecified part of unspecified bronchus or lung: Secondary | ICD-10-CM | POA: Insufficient documentation

## 2018-06-23 DIAGNOSIS — J439 Emphysema, unspecified: Secondary | ICD-10-CM | POA: Insufficient documentation

## 2018-06-23 DIAGNOSIS — I700 Atherosclerosis of aorta: Secondary | ICD-10-CM | POA: Insufficient documentation

## 2018-06-23 DIAGNOSIS — I2510 Atherosclerotic heart disease of native coronary artery without angina pectoris: Secondary | ICD-10-CM | POA: Insufficient documentation

## 2018-06-30 NOTE — Progress Notes (Signed)
* [facility]     Assessment and Plan:  Severe persistent asthma with severe chronic, intractable cough. -Patient has a history of severe asthma with poor control despite maximal treatment with different medications.  He was started on xolair and notes that his breathing/wheezing is much better, continue once per month.   Chronic bronchitis.  --Excess mucus production with cough.  --Recommend start Delsym  Lung mass. -  06/23/18>> compared with previous on 03/30/2018; the RLL medial infiltrates have improved, nearly resolved. The LLL medial infiltrates are similarly resolving. There is new subsegmental atelectasis in the RML. Emphysema persists.    Atrial fibrillation. - Has been flared up in the past due to prednisone. -Patient currently on Xarelto.  Obstructive sleep apnea. - Patient was previously diagnosed with sleep apnea, was intolerant to CPAP, therefore stopped.   Date: 06/30/2018  MRN# [id number] Clarence Dawson 1946-04-15   Clarence Dawson is a 72 y.o. old male seen in follow up for chief complaint of  Chief Complaint  Patient presents with  . Follow-up    Asthma: Pt had recent CT Chest.  Pt still having cough-unable to bring much up.      HPI:  The patient is a 72 year old male, persistent cough and asthma symptoms, which has not responded to multiple medications including Zyrtec, Mucinex, Symbicort, Pepcid, Delsym, tramadol, chlorpheniramine, Tessalon with variable to minimal relief. Last visit he was started on Xolair  Since his last visit he notes that his breathing is better and he is able to do more and his wheezing is gone. He continues to have mucus in his lungs and has a cough. He is using wixela 500 bid, singulair.   He has a history of OSA, but stopped using it because it was noisy and hated it. He also has trouble tolerating it because of coughing.   ** Allergy profile 03/11/2016 >  Eos 0.2 /  IgE  70 Pos RAST Mold > dust > cockroach   **Sinus CT 03/14/16 > Mild inflammatory changes s acute change **Spirometry 02/16/2018  FEV1 2.26 (70%)  Ratio 60   **CT chest 03/30/2018; as well as PET scan emphysema, worst in the apices, area of masslike infiltrate in the posterior medial basilar segment, there is mild subcarinal lymphadenopathy.  The right mid zone nodule seen on initial CT chest has resolved, the masslike infiltrate in the right lower lobe persists. **CT chest 06/23/18>> images personally reviewed, compared with previous; the RLL medial infiltrates have improved, nearly resolved. The LLL medial infiltrates are similarly resolving. There is new subsegmental atelectasis in the RML. Emphysema persists.   Medication:    Current Outpatient Medications:  .  albuterol (PROVENTIL HFA;VENTOLIN HFA) 108 (90 Base) MCG/ACT inhaler, Inhale 2 puffs every 4 (four) hours as needed into the lungs for shortness of breath (only if you can't catch your breath)., Disp: , Rfl:  .  amLODipine (NORVASC) 10 MG tablet, Take 10 mg by mouth daily.  , Disp: , Rfl:  .  bisoprolol (ZEBETA) 5 MG tablet, TAKE 1 AND 1/2 TABLET BY MOUTH TWICE DAILY., Disp: 90 tablet, Rfl: 3 .  budesonide-formoterol (SYMBICORT) 160-4.5 MCG/ACT inhaler, Take 2 puffs first thing in am and then another 2 puffs about 12 hours later., Disp: 1 Inhaler, Rfl: 11 .  canagliflozin (INVOKANA) 300 MG TABS tablet, Take 300 mg by mouth daily before breakfast., Disp: , Rfl:  .  cloNIDine (CATAPRES) 0.1 MG tablet, Take 0.1 mg by mouth 2 (two) times daily., Disp: ,  Rfl:  .  dextromethorphan (DELSYM) 30 MG/5ML liquid, 2 tsp every 12 hours as needed if still coughing, Disp: , Rfl:  .  Fluticasone-Salmeterol (ADVAIR DISKUS) 500-50 MCG/DOSE AEPB, Inhale 1 puff into the lungs 2 (two) times daily. Rinse mouth after use., Disp: 1 each, Rfl: 5 .  glipiZIDE (GLUCOTROL XL) 5 MG 24 hr tablet, Take 5 mg by mouth daily before breakfast., Disp: , Rfl:  .  levothyroxine (SYNTHROID, LEVOTHROID) 137 MCG tablet, Take  137 mcg by mouth daily before breakfast. , Disp: , Rfl:  .  loratadine (CLARITIN) 10 MG tablet, Take 10 mg by mouth daily., Disp: , Rfl:  .  losartan-hydrochlorothiazide (HYZAAR) 100-25 MG per tablet, Take 1 tablet by mouth daily., Disp: , Rfl:  .  metFORMIN (GLUCOPHAGE-XR) 500 MG 24 hr tablet, Take 500 mg by mouth every evening. , Disp: , Rfl:  .  montelukast (SINGULAIR) 10 MG tablet, One at bedtime every night, Disp: 30 tablet, Rfl: 2 .  Multiple Vitamin (MULTIVITAMIN) tablet, Take 1 tablet by mouth daily.  , Disp: , Rfl:  .  omeprazole (PRILOSEC OTC) 20 MG tablet, Take 20 mg by mouth daily. , Disp: , Rfl:  .  rivaroxaban (XARELTO) 20 MG TABS tablet, Take 20 mg daily with supper by mouth., Disp: , Rfl:  .  simvastatin (ZOCOR) 20 MG tablet, Take 20 mg by mouth daily., Disp: , Rfl:   Current Facility-Administered Medications:  .  omalizumab Arvid Right) injection 300 mg, 300 mg, Subcutaneous, Q14 Days, [name], MD, 300 mg at 06/11/18 [id number]   Allergies:  Fish allergy; Food; Iodinated diagnostic agents; Shellfish allergy; Goat-derived products; Prednisone; Clavulanic acid; and Voltaren [diclofenac sodium]   Review of Systems:  Constitutional: Feels well. Cardiovascular: No chest pain.  Pulmonary: Denies hemoptysis The remainder of systems were reviewed and were found to be negative other than what is documented in the HPI.   Physical Examination:   VS: BP 130/82 (BP Location: Left Arm, Cuff Size: Normal)   Pulse 85   Ht 5\' 10"  (1.778 m)   Wt 224 lb 6.4 oz (101.8 kg)   SpO2 91%   BMI 32.20 kg/m   General Appearance: No distress  Neuro:without focal findings, mental status, speech normal, alert and oriented HEENT: PERRLA, EOM intact Pulmonary: No wheezing, No rales  CardiovascularNormal S1,S2.  No m/r/g.  Abdomen: Benign, Soft, non-tender, No masses Renal:  No costovertebral tenderness  GU:  No performed at this time. Endoc: No evident thyromegaly, no signs of acromegaly  or Cushing features Skin:   warm, no rashes, no ecchymosis  Extremities: normal, no cyanosis, clubbing.      LABORATORY PANEL:   CBC No results for input(s): WBC, HGB, HCT, PLT in the last 168 hours. ------------------------------------------------------------------------------------------------------------------  Chemistries  No results for input(s): NA, K, CL, CO2, GLUCOSE, BUN, CREATININE, CALCIUM, MG, AST, ALT, ALKPHOS, BILITOT in the last 168 hours.  Invalid input(s): GFRCGP ------------------------------------------------------------------------------------------------------------------  Cardiac Enzymes No results for input(s): TROPONINI in the last 168 hours. ------------------------------------------------------------  RADIOLOGY:   No results found for this or any previous visit. Results for orders placed during the hospital encounter of 03/16/18  DG Chest 2 View   Narrative CLINICAL DATA:  Cough and congestion.  EXAM: CHEST - 2 VIEW  COMPARISON:  03/02/2018.  FINDINGS: Cardiac pacer with lead tips over the right atrium and right ventricle. Heart size normal. Low lung volumes. Mild basilar atelectasis. Interval near complete clearing of right base infiltrate. No pleural effusion or pneumothorax. Degenerative changes  thoracic spine.  IMPRESSION: 1. Cardiac pacer with lead tips over the right atrium right ventricle.  2. Low lung volumes with mild basilar atelectasis. Interval clearing scratched it near complete interval clearing of left base infiltrate.   Electronically Signed   By: Marcello Moores  Register   On: 03/16/2018 11:48    ------------------------------------------------------------------------------------------------------------------  Thank  you for allowing [facility] to assist in the care of your patient. Our recommendations are noted above.  Please contact us if we can be of further service.   Marda Stalker,  M.D., F.C.C.P.  Board Certified in Internal Medicine, Pulmonary Medicine, [place], and Sleep Medicine.  [facility] Office Number: [phone]   07/05/2018

## 2018-07-05 ENCOUNTER — Ambulatory Visit: Payer: Medicare Other | Admitting: Internal Medicine

## 2018-07-05 ENCOUNTER — Telehealth: Payer: Self-pay

## 2018-07-05 ENCOUNTER — Encounter: Payer: Self-pay | Admitting: Internal Medicine

## 2018-07-05 VITALS — BP 130/82 | HR 85 | Ht 70.0 in | Wt 224.4 lb

## 2018-07-05 DIAGNOSIS — R918 Other nonspecific abnormal finding of lung field: Secondary | ICD-10-CM

## 2018-07-05 DIAGNOSIS — I481 Persistent atrial fibrillation: Secondary | ICD-10-CM | POA: Diagnosis not present

## 2018-07-05 DIAGNOSIS — G4733 Obstructive sleep apnea (adult) (pediatric): Secondary | ICD-10-CM | POA: Diagnosis not present

## 2018-07-05 DIAGNOSIS — J4540 Moderate persistent asthma, uncomplicated: Secondary | ICD-10-CM

## 2018-07-05 DIAGNOSIS — I4819 Other persistent atrial fibrillation: Secondary | ICD-10-CM

## 2018-07-05 NOTE — Telephone Encounter (Signed)
Mr. Reim called and stated that he has changed Pulmonologists and his new physician stated that "what was there, is no longer there" referring to his lung nodule Dr. Servando Snare was following.  He has an appointment with Dr. Servando Snare 07/22/18 and was wondering if he needed to attend this appointment.  I advised him to keep this appointment and let Dr. Servando Snare review the CT with him as well.  He acknowledged receipt.  He also discussed the need to possibly re-schedule that appointment due to a family issue.  He stated that he would call back IF needing to re-schedule.

## 2018-07-05 NOTE — Patient Instructions (Addendum)
--  Recommend starting mucinex 600 mg tablets twice per day.  --Continue to follow up every 3 months while you are taking xolair.  --Continue Wixela, singulair.

## 2018-07-09 ENCOUNTER — Telehealth: Payer: Self-pay | Admitting: Internal Medicine

## 2018-07-09 NOTE — Telephone Encounter (Signed)
Prefilled Syringe: #150mg  2  #75mg  0 Ordered Date: 07/09/18 Shipping Date: 07/11/18

## 2018-07-12 ENCOUNTER — Ambulatory Visit (INDEPENDENT_AMBULATORY_CARE_PROVIDER_SITE_OTHER): Payer: Medicare Other

## 2018-07-12 ENCOUNTER — Ambulatory Visit: Payer: Medicare Other

## 2018-07-12 DIAGNOSIS — J4540 Moderate persistent asthma, uncomplicated: Secondary | ICD-10-CM | POA: Diagnosis not present

## 2018-07-12 MED ORDER — OMALIZUMAB 150 MG ~~LOC~~ SOLR
300.0000 mg | SUBCUTANEOUS | Status: DC
  Administered 2018-07-12: 300 mg via SUBCUTANEOUS

## 2018-07-12 NOTE — Telephone Encounter (Signed)
Prefilled Syringes: # 150mg  2  #75mg  0 Arrival Date: 07/12/18 Lot #: 150mg  [id number]      Exp Date: 150mg  12/2018

## 2018-07-22 ENCOUNTER — Ambulatory Visit: Payer: Medicare Other | Admitting: Cardiothoracic Surgery

## 2018-08-02 ENCOUNTER — Ambulatory Visit (INDEPENDENT_AMBULATORY_CARE_PROVIDER_SITE_OTHER): Payer: Medicare Other | Admitting: *Deleted

## 2018-08-02 DIAGNOSIS — I495 Sick sinus syndrome: Secondary | ICD-10-CM | POA: Diagnosis not present

## 2018-08-02 DIAGNOSIS — I4891 Unspecified atrial fibrillation: Secondary | ICD-10-CM

## 2018-08-02 NOTE — Progress Notes (Signed)
Remote pacemaker transmission.   

## 2018-08-05 ENCOUNTER — Telehealth: Payer: Self-pay | Admitting: Internal Medicine

## 2018-08-05 NOTE — Telephone Encounter (Signed)
Prefilled Syringe: #150mg  2  #75mg  0 Ordered Date: 08/05/18 Shipping Date: 08/08/18

## 2018-08-06 ENCOUNTER — Other Ambulatory Visit: Payer: Self-pay | Admitting: Internal Medicine

## 2018-08-06 DIAGNOSIS — J45991 Cough variant asthma: Secondary | ICD-10-CM

## 2018-08-09 NOTE — Telephone Encounter (Signed)
Prefilled Syringes: # 150mg  2  #75mg  0 Arrival Date: 08/09/18 Lot #: 150mg  [id number]      75mg  0 Exp Date: 150mg  01/2019   75mg  0

## 2018-08-10 ENCOUNTER — Ambulatory Visit (INDEPENDENT_AMBULATORY_CARE_PROVIDER_SITE_OTHER): Payer: Medicare Other

## 2018-08-10 DIAGNOSIS — J4540 Moderate persistent asthma, uncomplicated: Secondary | ICD-10-CM

## 2018-08-10 MED ORDER — OMALIZUMAB 150 MG ~~LOC~~ SOLR
300.0000 mg | SUBCUTANEOUS | Status: DC
  Administered 2018-08-10: 300 mg via SUBCUTANEOUS

## 2018-08-10 NOTE — Progress Notes (Signed)
Documentation of medication administration and charges of Xolair have been completed by Brayla Pat, CMA based on the Xolair documentation sheet completed by Tammy Scott.  

## 2018-08-26 ENCOUNTER — Encounter: Payer: Self-pay | Admitting: Cardiothoracic Surgery

## 2018-08-26 ENCOUNTER — Ambulatory Visit: Payer: Medicare Other | Admitting: Cardiothoracic Surgery

## 2018-08-26 ENCOUNTER — Other Ambulatory Visit: Payer: Self-pay

## 2018-08-26 VITALS — BP 103/68 | HR 96 | Resp 16 | Ht 70.0 in | Wt 227.0 lb

## 2018-08-26 DIAGNOSIS — R918 Other nonspecific abnormal finding of lung field: Secondary | ICD-10-CM | POA: Diagnosis not present

## 2018-08-26 LAB — CUP PACEART REMOTE DEVICE CHECK
Battery Remaining Longevity: 91 mo
Battery Remaining Percentage: 91 %
Brady Statistic AP VS Percent: 1 %
Brady Statistic AS VP Percent: 4.9 %
Brady Statistic AS VS Percent: 6.2 %
Brady Statistic RV Percent Paced: 93 %
Date Time Interrogation Session: 20190805070848
Implantable Lead Implant Date: 20051106
Implantable Lead Location: 753859
Lead Channel Impedance Value: 710 Ohm
Lead Channel Pacing Threshold Pulse Width: 0.4 ms
Lead Channel Sensing Intrinsic Amplitude: 7.9 mV
Lead Channel Setting Pacing Amplitude: 2 V
Lead Channel Setting Pacing Amplitude: 2.5 V
Lead Channel Setting Sensing Sensitivity: 2 mV
MDC IDC LEAD IMPLANT DT: 20051106
MDC IDC LEAD LOCATION: 753860
MDC IDC MSMT BATTERY VOLTAGE: 2.95 V
MDC IDC MSMT LEADCHNL RA IMPEDANCE VALUE: 400 Ohm
MDC IDC MSMT LEADCHNL RA PACING THRESHOLD AMPLITUDE: 0.75 V
MDC IDC MSMT LEADCHNL RA SENSING INTR AMPL: 1.7 mV
MDC IDC MSMT LEADCHNL RV PACING THRESHOLD AMPLITUDE: 1 V
MDC IDC MSMT LEADCHNL RV PACING THRESHOLD PULSEWIDTH: 0.4 ms
MDC IDC PG IMPLANT DT: 20140115
MDC IDC PG SERIAL: 7439597
MDC IDC SET LEADCHNL RV PACING PULSEWIDTH: 0.4 ms
MDC IDC STAT BRADY AP VP PERCENT: 88 %
MDC IDC STAT BRADY RA PERCENT PACED: 88 %

## 2018-08-26 NOTE — Progress Notes (Signed)
[address]       [place] [address]             [phone]                    Clarence Dawson [facility] Medical Record #[id number] Date of Birth: [birth year]  Referring: [name], MD Primary Care: [name], MD Primary Cardiologist: No primary care provider on file.  Chief Complaint:    Chief Complaint  Patient presents with  . Lung Lesion    4 month f/u with Chest CT 06/23/18 per Dr. Ashby Dawes    History of Present Illness:    Clarence Dawson 72 y.o. male is seen in the office  Today .  He was being evaluated for chronic cough of 8 months duration associated with episodic hemoptysis.  Chest x-ray suggested right lower lobe pneumonia.  Patient has been treated on 2 separate occasions with p.o. Antibiotics. ,  Without much improvement in his symptoms.  He has had multiple episodes of coughing up blood-tinged sputum.  He is on Xarelto for atrial fib.  While traveling in [place] on vacation he developed episode of fever and chills which spontaneously resolved.  April 4 a CT scan of the chest was done and the patient was referred to the surgical office.  PET has been done   His past medical history includes episodic atrial fibrillation especially when put on steroids for respiratory difficulty, has a history of chronic hypothyroidism on Synthroid since 1989.  He is a previous smoker but stopped in 1984.  In October 2005 he passed out and fell off a ladder and ultimately a permanent pacemaker was placed while living in [place].  In 2010 while in [place] he had what he describes as "map and zap".   Patient is currently diabetic treated with oral agents including Invocana  The patient returns to the office today with a follow-up CT scan of the chest.  He notes that overall he has been feeling better than when I last saw him.  Current Activity/ Functional Status:  Patient is independent with mobility/ambulation, transfers, ADL's,  IADL's.   Zubrod Score: At the time of surgery this patient's most appropriate activity status/level should be described as: []     0    Normal activity, no symptoms [x]     1    Restricted in physical strenuous activity but ambulatory, able to do out light work []     2    Ambulatory and capable of self care, unable to do work activities, up and about               >50 % of waking hours                              []     3    Only limited self care, in bed greater than 50% of waking hours []     4    Completely disabled, no self care, confined to bed or chair []     5    Moribund   Past Medical History:  Diagnosis Date  . Allergic rhinitis   . Aortic valve disorder   . Asthma    since childhood- seasonal allergies induced  . Cancer ([facility])    Skin cancer- squamous, basal  . Carotid artery stenosis   . Essential hypertension   . Full dentures   .  GERD (gastroesophageal reflux disease)   . H/O hiatal hernia   . Hemorrhage of rectum   . Hyperlipidemia   . Hypothyroidism   . 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 OSA (obstructive sleep apnea)   . Osteoarthritis   . Pacemaker    Oct 2005 in [place].  Marland Kitchen PAF (paroxysmal atrial fibrillation) ([place])   . Pneumonia    "several Times" 2015 last time  . Primary localized osteoarthritis of right knee 08/11/2017  . RBBB (right bundle branch block)   . Sinoatrial node dysfunction ([facility])   . Syncope   . Tricuspid valve disorder   . Type 2 diabetes mellitus ([facility])    Type II    Past Surgical History:  Procedure Laterality Date  . A-V CARDIAC PACEMAKER INSERTION     Sick sinus syndrome DDR pacer  . Arthropathy Right 2005   Rebuilding of left thumb and joint   . CARDIAC CATHETERIZATION    . CARDIAC ELECTROPHYSIOLOGY STUDY AND ABLATION  09/2008   for pvcs, Dr. Loralie Champagne  . CARDIOVERSION N/A 12/18/2016   Procedure: CARDIOVERSION;  Surgeon: Pixie Casino, MD;  Location[facility] ENDOSCOPY;  Service: Cardiovascular;  Laterality: N/A;  . [place]    right wrist  . CARPAL TUNNEL RELEASE  05/04/2012   Procedure: CARPAL TUNNEL RELEASE;  Surgeon: Wynonia Sours, MD;  Location: [facility];  Service: Orthopedics;  Laterality: Left;  . CARPOMETACARPEL SUSPENSION PLASTY Right 11/16/2014   Procedure: SUSPENSIONPLASTY RIGHT THUMB TENDON TRANSFER ABDUCTOR POLLICUS LONGUS EXCISION TRAPEZIUM;  Surgeon: Daryll Brod, MD;  Location: [facility];  Service: Orthopedics;  Laterality: Right;  . CHOLECYSTECTOMY  1994  . CIRCUMCISION    . COLONOSCOPY N/A 03/14/2013   Procedure: COLONOSCOPY;  Surgeon: Daneil Dolin, MD;  [facility];  Service: Endoscopy;  Laterality: N/A;  8:15 AM  . 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 EYE SURGERY  2012   Left eye Corneal transplant- partial- Cataract  . GALLBLADDER SURGERY  12/01/2006  . HAND TENDON SURGERY Left late 1990's   thumb  . HEMORROIDECTOMY  2003  . INJECTION KNEE Left 08/26/2017   Procedure: LEFT KNEE INJECTION;  Surgeon: Elsie Saas, MD;  Location: [facility];  Service: Orthopedics;  Laterality: Left;  . left Knee Arthroscopy     [facility]  . PARTIAL KNEE ARTHROPLASTY  11/22/2012   Procedure: UNICOMPARTMENTAL KNEE;  Surgeon: Lorn Junes, MD;  Location: [facility];  Service: Orthopedics;  Laterality: Left;  left unicompartmental knee arthroplasty  . PERMANENT PACEMAKER GENERATOR CHANGE N/A 01/12/2013   Procedure: PERMANENT PACEMAKER GENERATOR CHANGE;  Surgeon: Evans Lance, MD;  Location: [facility] CATH LAB;  Service: Cardiovascular;  Laterality: N/A;  . Rotator cuff Surgery  2001   Right shoulder  . TONSILLECTOMY    . TOTAL KNEE ARTHROPLASTY Right 08/26/2017   Procedure: TOTAL KNEE ARTHROPLASTY;  Surgeon: Elsie Saas, MD;  Location: [facility];  Service: Orthopedics;  Laterality: Right;  . varicose vein reduction      Family History  Problem Relation Age of Onset  . Other Father 44       Sudden Cardiac death  . Pancreatic cancer  Mother   . Colon cancer Mother   . Colon cancer Maternal Aunt   . Colon polyps Neg Hx     Social History   Socioeconomic History  . Marital status: Married    Spouse name: Not on file  .  Number of children: Not on file  . Years of education: Not on file  . Highest education level: Not on file  Occupational History  . Not on file  Social Needs  . Financial resource strain: Not on file  . Food insecurity:    Worry: Not on file    Inability: Not on file  . Transportation needs:    Medical: Not on file    Non-medical: Not on file  Tobacco Use  . Smoking status: Former Smoker    Packs/day: 1.00    Years: 25.00    Pack years: 25.00    Types: Cigarettes    Start date: 02/20/1958    Last attempt to quit: 02/12/1984    Years since quitting: 34.5  . Smokeless tobacco: Never Used  Substance and Sexual Activity  . Alcohol use: Yes    Alcohol/week: 4.0 standard drinks    Types: 4 Glasses of wine per week  . Drug use: No  . Sexual activity: Not on file  Lifestyle  . Physical activity:    Days per week: Not on file    Minutes per session: Not on file  . Stress: Not on file  Relationships  . Social connections:    Talks on phone: Not on file    Gets together: Not on file    Attends religious service: Not on file    Active member of club or organization: Not on file    Attends meetings of clubs or organizations: Not on file    Relationship status: Not on file  . Intimate partner violence:    Fear of current or ex partner: Not on file    Emotionally abused: Not on file    Physically abused: Not on file    Forced sexual activity: Not on file  Other Topics Concern  . Not on file  Social History Narrative   Regular exercise: No    Social History   Tobacco Use  Smoking Status Former Smoker  . Packs/day: 1.00  . Years: 25.00  . Pack years: 25.00  . Types: Cigarettes  . Start date: 02/20/1958  . Last attempt to quit: 02/12/1984  . Years since quitting: 34.5  Smokeless  Tobacco Never Used    Social History   Substance and Sexual Activity  Alcohol Use Yes  . Alcohol/week: 4.0 standard drinks  . Types: 4 Glasses of wine per week     Allergies  Allergen Reactions  . Fish Allergy Anaphylaxis and Shortness Of Breath    Only fish with iodine SEAFOOD  . Food Anaphylaxis and Shortness Of Breath    TREE NUTS  . Iodinated Diagnostic Agents Hives and Shortness Of Breath    Patient states hives to throat closing. (01/15/17: patient states this reaction was "about 20 years ago" with possibly an IVP.  He now says high doses of prednisone "throw me into AFib."  He has tolerated CT arthrograms with Benadrly 50mg  PO one hour before injection.  Brita Romp, RN)  . Shellfish Allergy Anaphylaxis    To shellfish, crabs.  Makes him feel like "things are crawling all over" me.  Denies airway issues with these foods.  Brita Romp, RN 01/15/17)  . Goat-Derived Products Hives    GOAT CHEESE GOATS  . Prednisone Palpitations    PRECIPITATES A-FIB  . Clavulanic Acid Diarrhea  . Voltaren [Diclofenac Sodium] Other (See Comments)    Feels like things are crawling on him    Current Outpatient Medications  Medication Sig Dispense  Refill  . albuterol (PROVENTIL HFA;VENTOLIN HFA) 108 (90 Base) MCG/ACT inhaler Inhale 2 puffs every 4 (four) hours as needed into the lungs for shortness of breath (only if you can't catch your breath).    Marland Kitchen amLODipine (NORVASC) 10 MG tablet Take 10 mg by mouth daily.      . bisoprolol (ZEBETA) 5 MG tablet TAKE 1 AND 1/2 TABLET BY MOUTH TWICE DAILY. 90 tablet 3  . canagliflozin (INVOKANA) 300 MG TABS tablet Take 300 mg by mouth daily before breakfast.    . cloNIDine (CATAPRES) 0.1 MG tablet Take 0.1 mg by mouth 2 (two) times daily.    Marland Kitchen dextromethorphan (DELSYM) 30 MG/5ML liquid 2 tsp every 12 hours as needed if still coughing    . EPINEPHrine 0.3 mg/0.3 mL IJ SOAJ injection Take as needed    . Fluticasone-Salmeterol (WIXELA INHUB) 500-50 MCG/DOSE  AEPB Inhale 1 puff into the lungs 2 (two) times daily.    Marland Kitchen glipiZIDE (GLUCOTROL XL) 5 MG 24 hr tablet Take 5 mg by mouth daily before breakfast.    . levothyroxine (SYNTHROID, LEVOTHROID) 137 MCG tablet Take 137 mcg by mouth daily before breakfast.     . loratadine (CLARITIN) 10 MG tablet Take 10 mg by mouth daily.    Marland Kitchen losartan-hydrochlorothiazide (HYZAAR) 100-25 MG per tablet Take 1 tablet by mouth daily.    . metFORMIN (GLUCOPHAGE-XR) 500 MG 24 hr tablet Take 500 mg by mouth every evening.     . montelukast (SINGULAIR) 10 MG tablet TAKE (1) TABLET BY MOUTH AT BEDTIME. 30 tablet 0  . Multiple Vitamin (MULTIVITAMIN) tablet Take 1 tablet by mouth daily.      Marland Kitchen omeprazole (PRILOSEC OTC) 20 MG tablet Take 20 mg by mouth daily.     . rivaroxaban (XARELTO) 20 MG TABS tablet Take 20 mg daily with supper by mouth.    . simvastatin (ZOCOR) 20 MG tablet Take 20 mg by mouth daily.    Arvid Right 150 MG/ML SOSY Inject 150 mg every month     Current Facility-Administered Medications  Medication Dose Route Frequency Provider Last Rate Last Dose  . omalizumab Arvid Right) injection 300 mg  300 mg Subcutaneous Q14 Days [name], MD   300 mg at 06/11/18 0924  . omalizumab Arvid Right) injection 300 mg  300 mg Subcutaneous Q14 Days [name], MD   300 mg at 07/12/18 1519  . omalizumab Arvid Right) injection 300 mg  300 mg Subcutaneous Q14 Days [name], MD   300 mg at 08/10/18 1541    Pertinent items are noted in HPI.   Review of Systems:  ROS Immunizations: Flu up to date Blue.Reese  ]; Pneumococcal up to date Blue.Reese  ];   Physical Exam: Ht 5\' 10"  (1.778 m)   Wt 227 lb (103 kg)   BMI 32.57 kg/m    General appearance: alert, cooperative and no distress Head: Normocephalic, without obvious abnormality, atraumatic Neck: no adenopathy, no carotid bruit, no JVD, supple, symmetrical, trachea midline and thyroid not enlarged, symmetric, no tenderness/mass/nodules Lymph nodes: Cervical,  supraclavicular, and axillary nodes normal. Resp: clear to auscultation bilaterally Back: symmetric, no curvature. ROM normal. No CVA tenderness. Cardio: regular rate and rhythm, S1, S2 normal, no murmur, click, rub or gallop GI: soft, non-tender; bowel sounds normal; no masses,  no organomegaly Extremities: extremities normal, atraumatic, no cyanosis or edema and Homans sign is negative, no sign of DVT Neurologic: Grossly normal   Diagnostic Studies & Laboratory data:     Recent Radiology  Findings:  CLINICAL DATA:  Follow-up right lung opacities.  EXAM: CT CHEST WITHOUT CONTRAST  TECHNIQUE: Multidetector CT imaging of the chest was performed following the standard protocol without IV contrast.  COMPARISON:  03/30/2018 chest CT.  04/22/2018 PET-CT.  FINDINGS: Cardiovascular: Normal heart size. Stable trace pericardial effusion/thickening anteriorly. Left main, left anterior descending and left circumflex coronary atherosclerosis. Stable 2 lead left subclavian pacemaker with lead tips in the right atrium and right ventricular apex. Atherosclerotic nonaneurysmal thoracic aorta. Stable dilated main pulmonary artery (3.6 cm diameter).  Mediastinum/Nodes: No discrete thyroid nodules. Unremarkable esophagus. No axillary adenopathy. Stable mildly enlarged 1.0 cm right lower paratracheal node (series 2/image 64). Enlarged 1.6 cm subcarinal node (series 2/image 80), mildly decreased from 1.9 cm on 03/30/2018. AP window adenopathy up to 1.3 cm (series 2/image 63), not appreciably changed using similar measurement technique. Stable mildly enlarged 1.3 cm right hilar node (series 2/image 80). No discrete left hilar adenopathy on these noncontrast images.  Lungs/Pleura: No pneumothorax. No pleural effusion. Severe centrilobular emphysema with mild diffuse bronchial wall thickening. Previously described irregular patchy consolidation throughout the right lower lobe on the  03/30/2018 chest CT study has largely resolved, with mild residual patchy ground-glass attenuation and reticulation. New 1.3 cm nodular focus of consolidation in the medial basilar left lower lobe (series 4/image 127). Medial right lower lobe subpleural 6 mm pulmonary nodule (series 4/image 93) and medial left upper lobe 7 mm pulmonary nodule (series 4/image 45) are both stable since 03/30/2018. New complete right middle lobe atelectasis. Tiny calcified granulomas throughout both lungs are unchanged.  Upper abdomen: Cholecystectomy.  Musculoskeletal: No aggressive appearing focal osseous lesions. Marked thoracic spondylosis.  IMPRESSION: 1. Near complete resolution of right lower lobe pneumonia. 2. New 1.3 cm nodular focus of consolidation in the medial basilar left lower lobe, probably inflammatory given short interval since 03/30/2018 chest CT. Recommend attention on follow-up chest CT in 3 months in this high risk patient. 3. Two solid pulmonary nodules, largest 7 mm in the left upper lobe, for which 2 month stability has been demonstrated. Non-contrast chest CT at 3-6 months is recommended. If the nodules are stable at time of repeat CT, then future CT at 18-24 months (from today's scan) is considered optional for low-risk patients, but is recommended for high-risk patients. This recommendation follows the consensus statement: Guidelines for Management of Incidental Pulmonary Nodules Detected on CT Images: From the Fleischner Society 2017; Radiology 2017; 284:228-243. 4. Severe emphysema with mild diffuse bronchial wall thickening, suggesting COPD. 5. Left main and two-vessel coronary atherosclerosis. 6. Stable dilated main pulmonary artery, suggesting chronic pulmonary arterial hypertension.  Aortic Atherosclerosis (ICD10-I70.0) and Emphysema (ICD10-J43.9).   Electronically Signed   By: Ilona Sorrel M.D.   On: 06/24/2018 19:55    Nm Pet Image Initial (pi) Skull  Base To Thigh  Result Date: 04/22/2018 CLINICAL DATA:  Initial treatment strategy for superior segment right lower lobe pulmonary lesion. EXAM: NUCLEAR MEDICINE PET SKULL BASE TO THIGH TECHNIQUE: 10.92 mCi F-18 FDG was injected intravenously. Full-ring PET imaging was performed from the skull base to thigh after the radiotracer. CT data was obtained and used for attenuation correction and anatomic localization. Fasting blood glucose: 138 mg/dl COMPARISON:  Chest CT 03/30/2018 FINDINGS: Mediastinal blood pool activity: SUV max 2.71 NECK: No hypermetabolic lymph nodes in the neck. Incidental CT findings: none CHEST: The right lower lobe pulmonary lesion has near completely resolved on the CT scan. There is some minimal residual vague airspace opacity and significant breathing  motion artifact. No hypermetabolism is demonstrated on the PET scan to suggest a neoplastic process. This is likely a resolving infiltrate. I would recommend a follow-up noncontrast chest CT scan in 3 months to reassess. Numerous borderline mediastinal and hilar lymph nodes are again noted. No significant hypermetabolism. The largest node in the subcarinal region on the right side measures 17.5 mm and now measures 16 mm on image number 94. SUV max is 3.16. Incidental CT findings: Stable emphysematous changes and pulmonary scarring. Bibasilar atelectasis. ABDOMEN/PELVIS: No abnormal hypermetabolic activity within the liver, pancreas, adrenal glands, or spleen. No hypermetabolic lymph nodes in the abdomen or pelvis. Incidental CT findings: Advanced atherosclerotic calcifications involving the abdominal aorta and branch vessels. Small operator lymph nodes are noted in the pelvis but no hypermetabolism. SKELETON: No focal hypermetabolic activity to suggest skeletal metastasis. Incidental CT findings: none IMPRESSION: 1. Near complete resolution of the right upper lobe pulmonary lesion. No hypermetabolism to suggest neoplastic process. This is  likely a resolving infiltrate. Recommend follow-up noncontrast chest CT in 3 months. 2. Stable borderline mediastinal and hilar lymph nodes likely related to the patient's emphysema. The largest subcarinal node has decreased in size slightly in demonstrates mild hypermetabolism. This is likely inflammatory/hyperplastic. 3. No significant findings in the abdomen or pelvis. Electronically Signed   By: Marijo Sanes M.D.   On: 04/22/2018 13:54    On reviewing the above report impression says upper lobe the body says lower lobe, when reviewing the study the lesion of concern is in the right lower lobe. I have independently reviewed the above radiology studies  and reviewed the findings with the patient.    Dg Chest 2 View  Result Date: 03/16/2018 CLINICAL DATA:  Cough and congestion. EXAM: CHEST - 2 VIEW COMPARISON:  03/02/2018. FINDINGS: Cardiac pacer with lead tips over the right atrium and right ventricle. Heart size normal. Low lung volumes. Mild basilar atelectasis. Interval near complete clearing of right base infiltrate. No pleural effusion or pneumothorax. Degenerative changes thoracic spine. IMPRESSION: 1. Cardiac pacer with lead tips over the right atrium right ventricle. 2. Low lung volumes with mild basilar atelectasis. Interval clearing scratched it near complete interval clearing of left base infiltrate. Electronically Signed   By: Marcello Moores  Register   On: 03/16/2018 11:48   Ct Chest Wo Contrast  Result Date: 03/31/2018 CLINICAL DATA:  Hemoptysis for 10 days. Treated for pneumonia 3 weeks ago. Ex-smoker. EXAM: CT CHEST WITHOUT CONTRAST TECHNIQUE: Multidetector CT imaging of the chest was performed following the standard protocol without IV contrast. COMPARISON:  Plain film 03/16/2018.  No prior CT. FINDINGS: Cardiovascular: Advanced aortic and branch vessel atherosclerosis. dual lead pacer. Mild cardiomegaly with lipomatous hypertrophy of the interatrial septum. Mild anterior pericardial fluid or  thickening is likely physiologic. Pulmonary artery enlargement, outflow tract 3.7 cm Mediastinum/Nodes: Low left jugular node measures 9 mm on image 7/2. Subcarinal adenopathy at 1.9 cm on image 80/2. Adjacent enlarged node within the azygoesophageal recess measures 1.8 cm on image 93/2. Suspect right hilar infrahilar adenopathy, including on image 101/2. Suboptimally evaluated on this noncontrast exam. Prevascular nodes measure up to 10 mm on image 62/2. Lungs/Pleura: No pleural fluid.  Moderate centrilobular emphysema. Scattered tiny pulmonary nodules, identified on series 4 including at 3 mm on image 60/4. Multifocal right greater than left lower lobe airspace disease, including on 119/4. Clustered small nodules in the left lower lobe, including on image 118/4, likely also infectious. Irregular superior segment right lower lobe 1.5 x 1.6 cm nodular density  including on image 73/4. Upper Abdomen: Cholecystectomy. Normal imaged portions of the liver, spleen, stomach, pancreas, adrenal glands, kidneys. Musculoskeletal: Moderate thoracic spondylosis, without acute focal osseous abnormality. IMPRESSION: 1. Right greater than left base airspace disease, consistent with residual or recurrent infection. 2. Irregular superior segment right lower lobe density, suspicious for primary bronchogenic carcinoma. 3. Thoracic adenopathy. Concurrent mild low left jugular adenopathy. Although findings could be reactive, metastatic disease is favored. Consider thoracic surgery consultation for eventual PET. The PET may be of highest specificity after antibiotic therapy to allow further regression/resolution of presumed infectious opacities. 4. Pulmonary artery enlargement suggests pulmonary arterial hypertension. 5. Aortic atherosclerosis (ICD10-I70.0), coronary artery atherosclerosis and emphysema (ICD10-J43.9). 6. Nonspecific scattered bilateral pulmonary nodules. These results will be called to the ordering clinician or  representative by the Radiologist Assistant, and communication documented in the PACS or zVision Dashboard. Electronically Signed   By: Abigail Miyamoto M.D.   On: 03/31/2018 09:08     I have independently reviewed the above radiologic studies.  Recent Lab Findings: Lab Results  Component Value Date   WBC 6.8 01/19/2018   HGB 17.4 (H) 01/19/2018   HCT 51.2 01/19/2018   PLT 182.0 01/19/2018   GLUCOSE 130 (H) 06/21/2018   ALT 21 10/20/2016   AST 18 10/20/2016   NA 141 06/21/2018   K 4.1 06/21/2018   CL 104 06/21/2018   CREATININE 1.10 06/21/2018   BUN 25 (H) 06/21/2018   CO2 29 06/21/2018   INR 0.91 01/10/2013   HGBA1C 7.2 (H) 08/13/2017      Assessment / Plan:  Patient's had near complete resolution of the right lower lobe pneumonic process for which he was referred to the surgical office, there is a new 1.3 cm nodule focus of consolidation in the left lower lobe probably inflammatory since that has developed in the short interval of time.  To follow-up on these lesions we will repeat a CT scan in 3 months.  This scan was reviewed with the patient    Grace Isaac MD      [address] [place] [address] Office [phone]   [place] [phone]  08/26/2018 4:41 PM

## 2018-08-30 ENCOUNTER — Other Ambulatory Visit: Payer: Self-pay | Admitting: Internal Medicine

## 2018-08-30 DIAGNOSIS — J45991 Cough variant asthma: Secondary | ICD-10-CM

## 2018-09-08 ENCOUNTER — Telehealth: Payer: Self-pay | Admitting: *Deleted

## 2018-09-08 NOTE — Telephone Encounter (Signed)
TS please advise patient is requesting update on injections, thank you.

## 2018-09-09 NOTE — Telephone Encounter (Signed)
Pt called back, I had to return his call (had pts, and I've been ordering meds.) I explained to him why I was calling, pt had not listened to my message. I made him an appt for tomorrow afternoon.( xolair will be here by then.)

## 2018-09-09 NOTE — Telephone Encounter (Signed)
Pt is calling back about his [place] [phone]

## 2018-09-09 NOTE — Telephone Encounter (Signed)
[facility] already had pt. Consent. I was able to set up the order. [name] is coming in tomorrow. I called Clarence Dawson and lmomtcb and sch an appt for tomorrow afternoon. As for me telling him not to give consent,I would take care of it. That was a misunderstanding. I told him once he gave his consent I would take care of it. Will wait to see if pt calls back.

## 2018-09-09 NOTE — Telephone Encounter (Signed)
Prefilled Syringe: #150mg  2  #75mg  0 Ordered Date: 09/09/18 Shipping Date: 09/09/18

## 2018-09-09 NOTE — Telephone Encounter (Signed)
Called and spoke with pt stating he was due for a xolair injection. Pt stated he had received calls from the people saying the xolair was to be shipped had called pt saying they were ready to ship the med.  Pt last had his xolair 8/13.  Spoke with Alroy Bailiff and she asked me if pt had given the pharmacy the okay to ship the med. I stated to her I did not know.  Spoke with pt again and pt had not given the okay as pt stated per Alroy Bailiff, he said she told him that she would take care of calling the pharmacy as she has done so in the past to have the med to be sent to our office.  I stated to pt I would send this encounter to Alroy Bailiff so she can call the pharmacy to have pt's xolair shipped and also to call pt to have his next xolair scheduled.  Routing to [employer].

## 2018-09-09 NOTE — Telephone Encounter (Signed)
Pt is calling back [phone]

## 2018-09-09 NOTE — Telephone Encounter (Signed)
Nothing further needed.  Closing encounter.

## 2018-09-10 ENCOUNTER — Ambulatory Visit (INDEPENDENT_AMBULATORY_CARE_PROVIDER_SITE_OTHER): Payer: Medicare Other

## 2018-09-10 DIAGNOSIS — J4540 Moderate persistent asthma, uncomplicated: Secondary | ICD-10-CM

## 2018-09-10 MED ORDER — OMALIZUMAB 150 MG ~~LOC~~ SOLR
300.0000 mg | Freq: Once | SUBCUTANEOUS | Status: AC
  Administered 2018-09-10: 300 mg via SUBCUTANEOUS

## 2018-09-10 NOTE — Telephone Encounter (Signed)
Prefilled Syringes: # 150mg  2  #75mg  0 Arrival Date: 09/10/18 Lot #: 150mg  [id number]      75mg  0 Exp Date: 150mg  02/2019   75mg  0 I remembered I put xolair order in this encounter today when I went to put the delivery in.

## 2018-09-14 ENCOUNTER — Encounter (HOSPITAL_COMMUNITY): Payer: Self-pay | Admitting: Physical Therapy

## 2018-09-14 NOTE — Therapy (Signed)
[facility] [place], [place], [address] Phone: [phone]   Fax:  [phone]  Patient Details  Name: ANISH VANA MRN: [id number] Date of Birth: November 09, 1946 Referring Provider:  No ref. provider found  Encounter Date: 09/14/2018  PHYSICAL THERAPY DISCHARGE SUMMARY  Visits from Start of Care: 17  Current functional level related to goals / functional outcomes:  All goals met DC    Remaining deficits: Unable to assess    Education / Equipment: None  Plan: Patient agrees to discharge.  Patient goals were met. Patient is being discharged due to meeting the stated rehab goals.  ?????      Deniece Ree PT, DPT, CBIS  Supplemental Physical Therapist [facility]    Pager [phone] Acute Rehab Office [place] [address] [place], [place], [address] Phone: [phone]   Fax:  [phone]

## 2018-09-27 ENCOUNTER — Other Ambulatory Visit: Payer: Self-pay | Admitting: Internal Medicine

## 2018-09-27 DIAGNOSIS — J45991 Cough variant asthma: Secondary | ICD-10-CM

## 2018-09-27 IMAGING — CT NM PET TUM IMG INITIAL (PI) SKULL BASE T - THIGH
1 of 8 series · 1 of 25 positions shown · non-contrast
Comparison: Chest CT 03/30/2018

CLINICAL DATA: Initial treatment strategy for superior segment
right lower lobe pulmonary lesion.

EXAM:
NUCLEAR MEDICINE PET SKULL BASE TO THIGH
TECHNIQUE: 10.92 mCi F-18 FDG was injected intravenously. Full-ring PET imaging
was performed from the skull base to thigh after the radiotracer. CT
data was obtained and used for attenuation correction and anatomic
localization.
Fasting blood glucose: 138 mg/dl

[Series 4: ct sk_thigh 5.0 b31f · axial · 5.0mm · 0.98mm/px · 1 of 243 slices shown]
[im 243/243  brain]
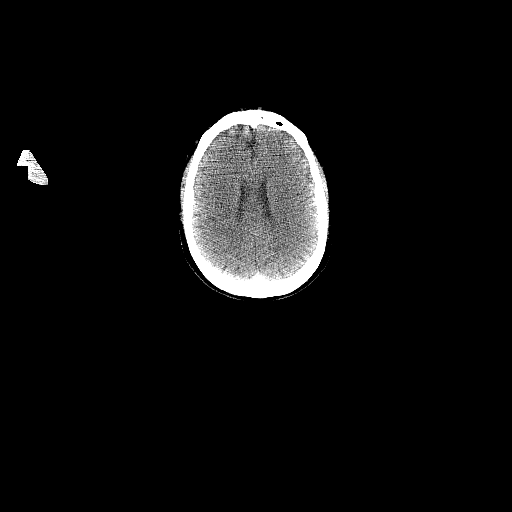

[1 of 25 positions shown; findings below may reference images not displayed]

FINDINGS: Mediastinal blood pool activity: SUV max

NECK: No hypermetabolic lymph nodes in the neck.

Incidental CT findings: none

CHEST: The right lower lobe pulmonary lesion has near completely
resolved on the CT scan. There is some minimal residual vague
airspace opacity and significant breathing motion artifact. No
hypermetabolism is demonstrated on the PET scan to suggest a
neoplastic process. This is likely a resolving infiltrate. I would
recommend a follow-up noncontrast chest CT scan in 3 months to
reassess.

Numerous borderline mediastinal and hilar lymph nodes are again
noted. No significant hypermetabolism. The largest node in the
subcarinal region on the right side measures 17.5 mm and now
measures 16 mm on image number 94. SUV max is 3.16.

Incidental CT findings: Stable emphysematous changes and pulmonary
scarring. Bibasilar atelectasis.

ABDOMEN/PELVIS: No abnormal hypermetabolic activity within the
liver, pancreas, adrenal glands, or spleen. No hypermetabolic lymph
nodes in the abdomen or pelvis.

Incidental CT findings: Advanced atherosclerotic calcifications
involving the abdominal aorta and branch vessels. Small operator
lymph nodes are noted in the pelvis but no hypermetabolism.

SKELETON: No focal hypermetabolic activity to suggest skeletal
metastasis.

Incidental CT findings: none
IMPRESSION: 1. Near complete resolution of the right upper lobe pulmonary
lesion. No hypermetabolism to suggest neoplastic process. This is
likely a resolving infiltrate. Recommend follow-up noncontrast chest
CT in 3 months.
2. Stable borderline mediastinal and hilar lymph nodes likely
related to the patient's emphysema. The largest subcarinal node has
decreased in size slightly in demonstrates mild hypermetabolism.
This is likely inflammatory/hyperplastic.
3. No significant findings in the abdomen or pelvis.

## 2018-09-30 ENCOUNTER — Telehealth: Payer: Self-pay | Admitting: Internal Medicine

## 2018-09-30 NOTE — Telephone Encounter (Signed)
Prefilled Syringe: #150mg  2  #75mg  0 Ordered Date: 09/30/18 Shipping Date: 09/30/18

## 2018-10-01 NOTE — Telephone Encounter (Signed)
Prefilled Syringes: # 150mg   2  #75mg  0 Arrival Date: 10/01/18 Lot #: 150mg  [id number]      75mg  0 Exp Date: 150mg  03/2019   75mg  0

## 2018-10-04 ENCOUNTER — Ambulatory Visit: Payer: Medicare Other

## 2018-10-07 NOTE — Progress Notes (Signed)
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 -Patient has a history of severe asthma with poor control despite maximal treatment with different medications.  He was started on xolair and notes that his breathing/wheezing is much better, continue 150 mg once per month.  -Flu vaccine today. -Pneumococcal 13, 08/2015.  Chronic bronchitis. --Excess mucus production with cough.  --He thinks Mucinex may be making this worse, can trial off of it.  I have also asked him to try changing Claritin to Claritin-D. - If in 3 months he is not feeling better, can consider increasing the dose of Xolair.  Lung mass. -  06/23/18>> compared with previous on 03/30/2018; the RLL medial infiltrates have improved, nearly resolved. The LLL medial infiltrates are similarly resolving. There is new subsegmental atelectasis in the RML. Emphysema persists. - No need for further work-up/surveillance.   Atrial fibrillation. - Has been flared up in the past due to prednisone. -Patient currently on Xarelto.  Obstructive sleep apnea. - Patient was previously diagnosed with sleep apnea, was intolerant to CPAP, therefore stopped.   Date: 10/07/2018  MRN# [id number] Clarence Dawson Jun 10, 1946   Clarence Dawson is a 21 y.o. old male seen in follow up for chief complaint of  Chief Complaint  Patient presents with  . Cough    using mucinex, color ranges from white to yellow to brown  . Wheezing    especially in morning     HPI:  The patient is a 72 year old male, persistent cough and asthma symptoms, which has not responded to multiple medications including Zyrtec, Mucinex, Symbicort, Pepcid, Delsym, tramadol, chlorpheniramine, Tessalon with variable to minimal relief. At last visit he was noted to be doing much better on Xolair.  Today he notes that he had been doing better, he has been working outside a lot though the has been working  outside a lot. This seems to help. He feels that mucinex is making him have more expectoration.  He is taking claritin once at night. He denies sinus drainage, he has reflux controlled by omeprazole.  He is taking wixella twice daily, and rinses mouth out.  He takes singulair daily.  He is taking xolair once per month.    He has a history of OSA, but stopped using it because it was noisy and hated it. He also has trouble tolerating it because of coughing.   ** Allergy profile 03/11/2016 >  Eos 0.2 /  IgE  70 Pos RAST Mold > dust > cockroach   **Sinus CT 03/14/16 > Mild inflammatory changes s acute change **Spirometry 02/16/2018  FEV1 2.26 (70%)  Ratio 60   **CT chest 03/30/2018; as well as PET scan emphysema, worst in the apices, area of masslike infiltrate in the posterior medial basilar segment, there is mild subcarinal lymphadenopathy.  The right mid zone nodule seen on initial CT chest has resolved, the masslike infiltrate in the right lower lobe persists. **CT chest 06/23/18>> images personally reviewed, compared with previous; the RLL medial infiltrates have improved, nearly resolved. The LLL medial infiltrates are similarly resolving. There is new subsegmental atelectasis in the RML. Emphysema persists.   Medication:    Current Outpatient Medications:  .  albuterol (PROVENTIL HFA;VENTOLIN HFA) 108 (90 Base) MCG/ACT inhaler, Inhale 2 puffs every 4 (four) hours as needed into the lungs for shortness of breath (only if you can't catch your breath)., Disp: , Rfl:  .  amLODipine (NORVASC) 10  MG tablet, Take 10 mg by mouth daily.  , Disp: , Rfl:  .  bisoprolol (ZEBETA) 5 MG tablet, TAKE 1 AND 1/2 TABLET BY MOUTH TWICE DAILY., Disp: 90 tablet, Rfl: 3 .  canagliflozin (INVOKANA) 300 MG TABS tablet, Take 300 mg by mouth daily before breakfast., Disp: , Rfl:  .  cloNIDine (CATAPRES) 0.1 MG tablet, Take 0.1 mg by mouth 2 (two) times daily., Disp: , Rfl:  .  EPINEPHrine 0.3 mg/0.3 mL IJ SOAJ injection,  Take as needed, Disp: , Rfl:  .  glipiZIDE (GLUCOTROL XL) 5 MG 24 hr tablet, Take 5 mg by mouth daily before breakfast., Disp: , Rfl:  .  levothyroxine (SYNTHROID, LEVOTHROID) 137 MCG tablet, Take 137 mcg by mouth daily before breakfast. , Disp: , Rfl:  .  losartan-hydrochlorothiazide (HYZAAR) 100-25 MG per tablet, Take 1 tablet by mouth daily., Disp: , Rfl:  .  metFORMIN (GLUCOPHAGE-XR) 500 MG 24 hr tablet, Take 500 mg by mouth every evening. , Disp: , Rfl:  .  montelukast (SINGULAIR) 10 MG tablet, TAKE (1) TABLET BY MOUTH AT BEDTIME., Disp: 30 tablet, Rfl: 0 .  Multiple Vitamin (MULTIVITAMIN) tablet, Take 1 tablet by mouth daily.  , Disp: , Rfl:  .  omeprazole (PRILOSEC OTC) 20 MG tablet, Take 20 mg by mouth daily. , Disp: , Rfl:  .  rivaroxaban (XARELTO) 20 MG TABS tablet, Take 20 mg daily with supper by mouth., Disp: , Rfl:  .  simvastatin (ZOCOR) 20 MG tablet, Take 20 mg by mouth daily., Disp: , Rfl:  .  WIXELA INHUB 500-50 MCG/DOSE AEPB, INHALE 1 PUFF INTO THE LUNGS TWICE DAILY. RINSE MOUTH AFTER USE., Disp: 60 each, Rfl: 0 .  XOLAIR 150 MG/ML SOSY, Inject 150 mg every month, Disp: , Rfl:   Current Facility-Administered Medications:  .  omalizumab (XOLAIR) injection 300 mg, 300 mg, Subcutaneous, Q14 Days, [name], MD, 300 mg at 06/11/18 0924 .  omalizumab Arvid Right) injection 300 mg, 300 mg, Subcutaneous, Q14 Days, [name], MD, 300 mg at 07/12/18 1519 .  omalizumab Arvid Right) injection 300 mg, 300 mg, Subcutaneous, Q14 Days, [name], MD, 300 mg at 08/10/18 1541   Allergies:  Fish allergy; Food; Iodinated diagnostic agents; Shellfish allergy; Goat-derived products; Prednisone; Clavulanic acid; and Voltaren [diclofenac sodium]    Review of Systems:  Constitutional: Feels well. Cardiovascular: Denies chest pain, exertional chest pain.  Pulmonary: Denies hemoptysis, pleuritic chest pain.   The remainder of systems were reviewed and were found to be  negative other than what is documented in the HPI.    Physical Examination:   VS: BP 102/70 (BP Location: Left Arm, Cuff Size: Normal)   Pulse 84   Ht 5\' 10"  (1.778 m)   Wt 232 lb 6.4 oz (105.4 kg)   SpO2 95%   BMI 33.35 kg/m   General Appearance: No distress  Neuro:without focal findings, mental status, speech normal, alert and oriented HEENT: PERRLA, EOM intact Pulmonary: No wheezing, No rales  CardiovascularNormal S1,S2.  No m/r/g.  Abdomen: Benign, Soft, non-tender, No masses Renal:  No costovertebral tenderness  GU:  No performed at this time. Endoc: No evident thyromegaly, no signs of acromegaly or Cushing features Skin:   warm, no rashes, no ecchymosis  Extremities: normal, no cyanosis, clubbing.       LABORATORY PANEL:   CBC No results for input(s): WBC, HGB, HCT, PLT in the last 168 hours. ------------------------------------------------------------------------------------------------------------------  Chemistries  No results for input(s): NA, K, CL, CO2, GLUCOSE, BUN,  CREATININE, CALCIUM, MG, AST, ALT, ALKPHOS, BILITOT in the last 168 hours.  Invalid input(s): GFRCGP ------------------------------------------------------------------------------------------------------------------  Cardiac Enzymes No results for input(s): TROPONINI in the last 168 hours. ------------------------------------------------------------  RADIOLOGY:   No results found for this or any previous visit. Results for orders placed during the hospital encounter of 03/16/18  DG Chest 2 View   Narrative CLINICAL DATA:  Cough and congestion.  EXAM: CHEST - 2 VIEW  COMPARISON:  03/02/2018.  FINDINGS: Cardiac pacer with lead tips over the right atrium and right ventricle. Heart size normal. Low lung volumes. Mild basilar atelectasis. Interval near complete clearing of right base infiltrate. No pleural effusion or pneumothorax. Degenerative changes thoracic  spine.  IMPRESSION: 1. Cardiac pacer with lead tips over the right atrium right ventricle.  2. Low lung volumes with mild basilar atelectasis. Interval clearing scratched it near complete interval clearing of left base infiltrate.   Electronically Signed   By: Marcello Moores  Register   On: 03/16/2018 11:48    ------------------------------------------------------------------------------------------------------------------  Thank  you for allowing [facility] to assist in the care of your patient. Our recommendations are noted above.  Please contact us if we can be of further service.   Marda Stalker, M.D., F.C.C.P.  Board Certified in Internal Medicine, Pulmonary Medicine, [place], and Sleep Medicine.  [facility] Office Number: [phone]   10/07/2018

## 2018-10-08 ENCOUNTER — Encounter: Payer: Self-pay | Admitting: Internal Medicine

## 2018-10-08 ENCOUNTER — Ambulatory Visit (INDEPENDENT_AMBULATORY_CARE_PROVIDER_SITE_OTHER): Payer: Medicare Other | Admitting: Internal Medicine

## 2018-10-08 VITALS — BP 102/70 | HR 84 | Ht 70.0 in | Wt 232.4 lb

## 2018-10-08 DIAGNOSIS — J4540 Moderate persistent asthma, uncomplicated: Secondary | ICD-10-CM

## 2018-10-08 DIAGNOSIS — I4819 Other persistent atrial fibrillation: Secondary | ICD-10-CM

## 2018-10-08 DIAGNOSIS — G4733 Obstructive sleep apnea (adult) (pediatric): Secondary | ICD-10-CM

## 2018-10-08 NOTE — Patient Instructions (Addendum)
Change claritin to Clartin-D.  Can stop mucinex if not helping.

## 2018-10-11 ENCOUNTER — Ambulatory Visit (INDEPENDENT_AMBULATORY_CARE_PROVIDER_SITE_OTHER): Payer: Medicare Other

## 2018-10-11 DIAGNOSIS — J4540 Moderate persistent asthma, uncomplicated: Secondary | ICD-10-CM | POA: Diagnosis not present

## 2018-10-11 MED ORDER — OMALIZUMAB 150 MG ~~LOC~~ SOLR
300.0000 mg | Freq: Once | SUBCUTANEOUS | Status: AC
  Administered 2018-10-11: 300 mg via SUBCUTANEOUS

## 2018-10-13 ENCOUNTER — Other Ambulatory Visit (HOSPITAL_COMMUNITY): Payer: Self-pay | Admitting: Internal Medicine

## 2018-10-13 ENCOUNTER — Ambulatory Visit (HOSPITAL_COMMUNITY)
Admission: RE | Admit: 2018-10-13 | Discharge: 2018-10-13 | Disposition: A | Discharge status: Home / Self Care | Payer: Medicare Other | Source: Ambulatory Visit | Attending: Internal Medicine | Admitting: Internal Medicine

## 2018-10-13 DIAGNOSIS — R102 Pelvic and perineal pain: Secondary | ICD-10-CM | POA: Insufficient documentation

## 2018-10-25 ENCOUNTER — Other Ambulatory Visit: Payer: Self-pay | Admitting: Internal Medicine

## 2018-10-25 DIAGNOSIS — J45991 Cough variant asthma: Secondary | ICD-10-CM

## 2018-10-26 ENCOUNTER — Telehealth: Payer: Self-pay | Admitting: *Deleted

## 2018-10-26 NOTE — Telephone Encounter (Signed)
TS please advise.  

## 2018-10-26 NOTE — Telephone Encounter (Signed)
Prefilled Syringe: #150mg  2  #75mg  0 Ordered Date: 10/26/18 Shipping Date: 10/28/18

## 2018-10-29 NOTE — Telephone Encounter (Signed)
Prefilled Syringes: # 150mg  2  #75mg  0 Arrival Date: 10/29/18 Lot #: 150mg  [id number]      75mg  0 Exp Date: 150mg  03/2019   75mg  0

## 2018-11-01 ENCOUNTER — Ambulatory Visit (INDEPENDENT_AMBULATORY_CARE_PROVIDER_SITE_OTHER): Payer: Medicare Other | Admitting: *Deleted

## 2018-11-01 DIAGNOSIS — I495 Sick sinus syndrome: Secondary | ICD-10-CM

## 2018-11-01 NOTE — Progress Notes (Signed)
Remote pacemaker transmission.   

## 2018-11-04 ENCOUNTER — Encounter: Payer: Self-pay | Admitting: Cardiology

## 2018-11-08 ENCOUNTER — Other Ambulatory Visit: Payer: Self-pay | Admitting: Internal Medicine

## 2018-11-09 ENCOUNTER — Ambulatory Visit: Payer: Medicare Other

## 2018-11-09 ENCOUNTER — Ambulatory Visit (INDEPENDENT_AMBULATORY_CARE_PROVIDER_SITE_OTHER): Payer: Medicare Other

## 2018-11-09 DIAGNOSIS — J4540 Moderate persistent asthma, uncomplicated: Secondary | ICD-10-CM | POA: Diagnosis not present

## 2018-11-09 MED ORDER — OMALIZUMAB 150 MG ~~LOC~~ SOLR
300.0000 mg | Freq: Once | SUBCUTANEOUS | Status: AC
  Administered 2018-11-09: 300 mg via SUBCUTANEOUS

## 2018-11-10 ENCOUNTER — Encounter: Payer: Medicare Other | Admitting: Internal Medicine

## 2018-11-16 ENCOUNTER — Encounter: Payer: Self-pay | Admitting: Internal Medicine

## 2018-11-16 ENCOUNTER — Ambulatory Visit (INDEPENDENT_AMBULATORY_CARE_PROVIDER_SITE_OTHER): Payer: Medicare Other | Admitting: Internal Medicine

## 2018-11-16 VITALS — BP 104/70 | HR 99 | Ht 70.0 in | Wt 230.8 lb

## 2018-11-16 DIAGNOSIS — I495 Sick sinus syndrome: Secondary | ICD-10-CM

## 2018-11-16 DIAGNOSIS — Z950 Presence of cardiac pacemaker: Secondary | ICD-10-CM | POA: Diagnosis not present

## 2018-11-16 DIAGNOSIS — I480 Paroxysmal atrial fibrillation: Secondary | ICD-10-CM | POA: Diagnosis not present

## 2018-11-16 MED ORDER — FLECAINIDE ACETATE 50 MG PO TABS: 75.0000 mg | ORAL_TABLET | Freq: Two times a day (BID) | ORAL | 3 refills | Status: DC

## 2018-11-16 NOTE — Patient Instructions (Addendum)
Medication Instructions:  Your physician has recommended you make the following change in your medication:   1.  Start taking flecainide 50 mg---Take 1.5 tablets (75 mg)  by mouth twice a day.  Labwork: None ordered.  Testing/Procedures:  You will come to [facility] office in 2 weeks for a nurse visit with 12 lead EKG for new start flecainide. Please schedule.  Follow-Up: Your physician wants you to follow-up in: 4 weeks with Dr. Lovena Le.      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 This monitoring reduces the number of office visits required to check your device to one time per year. It allows [place] to keep an eye on the functioning of your device to ensure it is working properly. You are scheduled for a device check from home on 01/31/2019. You may send your transmission at any time that day. If you have a wireless device, the transmission will be sent automatically. After your physician reviews your transmission, you will receive a postcard with your next transmission date.  Any Other Special Instructions Will Be Listed Below (If Applicable).  If you need a refill on your cardiac medications before your next appointment, please call your pharmacy.

## 2018-11-16 NOTE — Progress Notes (Signed)
HPI Clarence Dawson returns today for ongoing evaluation and management of sinus node dysfunction, PAF, s/p PPM insertion. Since his last visit a year ago, he has had more sob and peripheral edema.  He has had minimal symptomatic atrial fib.  Allergies  Allergen Reactions  . Fish Allergy Anaphylaxis and Shortness Of Breath    Only fish with iodine SEAFOOD  . Food Anaphylaxis and Shortness Of Breath    TREE NUTS  . Iodinated Diagnostic Agents Hives and Shortness Of Breath    Patient states hives to throat closing. (01/15/17: patient states this reaction was "about 20 years ago" with possibly an IVP.  He now says high doses of prednisone "throw me into AFib."  He has tolerated CT arthrograms with Benadrly 50mg  PO one hour before injection.  Clarence Romp, RN)  . Shellfish Allergy Anaphylaxis    To shellfish, crabs.  Makes him feel like "things are crawling all over" me.  Denies airway issues with these foods.  Clarence Romp, RN 01/15/17)  . Goat-Derived Products Hives    GOAT CHEESE GOATS  . Prednisone Palpitations    PRECIPITATES A-FIB  . Clavulanic Acid Diarrhea  . Voltaren [Diclofenac Sodium] Other (See Comments)    Feels like things are crawling on him     Current Outpatient Medications  Medication Sig Dispense Refill  . albuterol (PROVENTIL HFA;VENTOLIN HFA) 108 (90 Base) MCG/ACT inhaler Inhale 2 puffs every 4 (four) hours as needed into the lungs for shortness of breath (only if you can't catch your breath).    Clarence Dawson amLODipine (NORVASC) 10 MG tablet Take 10 mg by mouth daily.      . bisoprolol (ZEBETA) 5 MG tablet TAKE 1 AND 1/2 TABLET BY MOUTH TWICE DAILY. 90 tablet 3  . canagliflozin (INVOKANA) 300 MG TABS tablet Take 300 mg by mouth daily before breakfast.    . cloNIDine (CATAPRES) 0.1 MG tablet Take 0.1 mg by mouth 2 (two) times daily.    Clarence Dawson EPINEPHrine 0.3 mg/0.3 mL IJ SOAJ injection Take as needed    . glipiZIDE (GLUCOTROL XL) 5 MG 24 hr tablet Take 5 mg by mouth daily before  breakfast.    . levothyroxine (SYNTHROID, LEVOTHROID) 137 MCG tablet Take 137 mcg by mouth daily before breakfast.     . losartan-hydrochlorothiazide (HYZAAR) 100-25 MG per tablet Take 1 tablet by mouth daily.    . metFORMIN (GLUCOPHAGE-XR) 500 MG 24 hr tablet Take 500 mg by mouth every evening.     . montelukast (SINGULAIR) 10 MG tablet TAKE (1) TABLET BY MOUTH AT BEDTIME. 30 tablet 0  . Multiple Vitamin (MULTIVITAMIN) tablet Take 1 tablet by mouth daily.      Clarence Dawson omeprazole (PRILOSEC OTC) 20 MG tablet Take 20 mg by mouth daily.     . rivaroxaban (XARELTO) 20 MG TABS tablet Take 20 mg daily with supper by mouth.    . simvastatin (ZOCOR) 20 MG tablet Take 20 mg by mouth daily.    Clarence Dawson INHUB 500-50 MCG/DOSE AEPB INHALE 1 PUFF INTO THE LUNGS TWICE DAILY. RINSE MOUTH AFTER USE. 60 each 5  . flecainide (TAMBOCOR) 50 MG tablet Take 1.5 tablets (75 mg total) by mouth 2 (two) times daily. 270 tablet 3   No current facility-administered medications for this visit.      Past Medical History:  Diagnosis Date  . Allergic rhinitis   . Aortic valve disorder   . Asthma    since childhood- seasonal allergies induced  .  Cancer ([place])    Skin cancer- squamous, basal  . Carotid artery stenosis   . Essential hypertension   . Full dentures   . GERD (gastroesophageal reflux disease)   . H/O hiatal hernia   . Hemorrhage of rectum   . Hyperlipidemia   . Hypothyroidism   . 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 OSA (obstructive sleep apnea)   . Osteoarthritis   . Pacemaker    Oct 2005 in [place].  Clarence Dawson PAF (paroxysmal atrial fibrillation) ([place])   . Pneumonia    "several Times" 2015 last time  . Primary localized osteoarthritis of right knee 08/11/2017  . RBBB (right bundle branch block)   . Sinoatrial node dysfunction (HCC)   . Syncope   . Tricuspid valve disorder   . Type 2 diabetes mellitus (HCC)    Type II    ROS:   All systems reviewed and negative except as noted in the HPI.   Past Surgical History:    Procedure Laterality Date  . A-V CARDIAC PACEMAKER INSERTION     Sick sinus syndrome DDR pacer  . Arthropathy Right 2005   Rebuilding of left thumb and joint   . CARDIAC CATHETERIZATION    . CARDIAC ELECTROPHYSIOLOGY STUDY AND ABLATION  09/2008   for pvcs, Dr. Loralie Champagne  . CARDIOVERSION N/A 12/18/2016   Procedure: CARDIOVERSION;  Surgeon: Pixie Casino, MD;  Location: [facility] ENDOSCOPY;  Service: Cardiovascular;  Laterality: N/A;  . [place]   right wrist  . CARPAL TUNNEL RELEASE  05/04/2012   Procedure: CARPAL TUNNEL RELEASE;  Surgeon: Wynonia Sours, MD;  Location: [place];  Service: Orthopedics;  Laterality: Left;  . CARPOMETACARPEL SUSPENSION PLASTY Right 11/16/2014   Procedure: SUSPENSIONPLASTY RIGHT THUMB TENDON TRANSFER ABDUCTOR POLLICUS LONGUS EXCISION TRAPEZIUM;  Surgeon: Daryll Brod, MD;  Location: [place];  Service: Orthopedics;  Laterality: Right;  . CHOLECYSTECTOMY  1994  . CIRCUMCISION    . COLONOSCOPY N/A 03/14/2013   Procedure: COLONOSCOPY;  Surgeon: Daneil Dolin, MD;  [facility];  Service: Endoscopy;  Laterality: N/A;  8:15 AM  . EYE SURGERY     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  . EYE SURGERY  2012   Left eye Corneal transplant- partial- Cataract  . GALLBLADDER SURGERY  12/01/2006  . HAND TENDON SURGERY Left late 1990's   thumb  . HEMORROIDECTOMY  2003  . INJECTION KNEE Left 08/26/2017   Procedure: LEFT KNEE INJECTION;  Surgeon: Elsie Saas, MD;  Location: [place];  Service: Orthopedics;  Laterality: Left;  . left Knee Arthroscopy     [facility]  . PARTIAL KNEE ARTHROPLASTY  11/22/2012   Procedure: UNICOMPARTMENTAL KNEE;  Surgeon: Lorn Junes, MD;  Location: [place];  Service: Orthopedics;  Laterality: Left;  left unicompartmental knee arthroplasty  . PERMANENT PACEMAKER GENERATOR CHANGE N/A 01/12/2013   Procedure: PERMANENT PACEMAKER GENERATOR CHANGE;  Surgeon: Evans Lance, MD;  [facility] CATH LAB;  Service: Cardiovascular;  Laterality: N/A;  . Rotator cuff Surgery  2001   Right shoulder  . TONSILLECTOMY    . TOTAL KNEE ARTHROPLASTY Right 08/26/2017   Procedure: TOTAL KNEE ARTHROPLASTY;  Surgeon: Elsie Saas, MD;  Location: [place];  Service: Orthopedics;  Laterality: Right;  . varicose vein reduction       Family History  Problem Relation Age of Onset  . Other Father 72       Sudden Cardiac death  . Pancreatic  cancer Mother   . Colon cancer Mother   . Colon cancer Maternal Aunt   . Colon polyps Neg Hx      Social History   Socioeconomic History  . Marital status: Married    Spouse name: Not on file  . Number of children: Not on file  . Years of education: Not on file  . Highest education level: Not on file  Occupational History  . Not on file  Social Needs  . Financial resource strain: Not on file  . Food insecurity:    Worry: Not on file    Inability: Not on file  . Transportation needs:    Medical: Not on file    Non-medical: Not on file  Tobacco Use  . Smoking status: Former Smoker    Packs/day: 1.00    Years: 25.00    Pack years: 25.00    Types: Cigarettes    Start date: 02/20/1958    Last attempt to quit: 02/12/1984    Years since quitting: 34.7  . Smokeless tobacco: Never Used  Substance and Sexual Activity  . Alcohol use: Yes    Alcohol/week: 4.0 standard drinks    Types: 4 Glasses of wine per week  . Drug use: No  . Sexual activity: Not on file  Lifestyle  . Physical activity:    Days per week: Not on file    Minutes per session: Not on file  . Stress: Not on file  Relationships  . Social connections:    Talks on phone: Not on file    Gets together: Not on file    Attends religious service: Not on file    Active member of club or organization: Not on file    Attends meetings of clubs or organizations: Not on file    Relationship status: Not on file  . Intimate partner violence:    Fear of current or  ex partner: Not on file    Emotionally abused: Not on file    Physically abused: Not on file    Forced sexual activity: Not on file  Other Topics Concern  . Not on file  Social History Narrative   Regular exercise: No     BP 104/70   Pulse 99   Ht 5\' 10"  (1.778 m)   Wt 230 lb 12.8 oz (104.7 kg)   SpO2 98%   BMI 33.12 kg/m   Physical Exam:  Well appearing NAD HEENT: Unremarkable Neck:  No JVD, no thyromegally Lymphatics:  No adenopathy Back:  No CVA tenderness Lungs:  Clear with no wheezes HEART:  Regular rate rhythm, no murmurs, no rubs, no clicks Abd:  soft, positive bowel sounds, no organomegally, no rebound, no guarding Ext:  2 plus pulses, 2+ edema, no cyanosis, no clubbing Skin:  No rashes no nodules Neuro:  CN II through XII intact, motor grossly intact  EKG - atrial fib with an RVR  DEVICE  Normal device function.  See PaceArt for details.   Assess/Plan: 1. Atrial fib - his VR is increased. He will continue his beta blocker. He will start flecainide. He will return in 2 weeks for an ECG. I will see him in 4 weeks.  2. Chronic diastolic heart failure- He will feel better in NSR. I have started flecainide. 3. Sinus node dysfunction - he is s/p PPM insertion. His St. Jude device is working normally.  Clarence Dawson.D.

## 2018-11-30 ENCOUNTER — Ambulatory Visit (INDEPENDENT_AMBULATORY_CARE_PROVIDER_SITE_OTHER): Payer: Medicare Other

## 2018-11-30 VITALS — BP 120/70 | HR 79 | Ht 70.0 in | Wt 232.8 lb

## 2018-11-30 DIAGNOSIS — I480 Paroxysmal atrial fibrillation: Secondary | ICD-10-CM

## 2018-11-30 MED ORDER — FUROSEMIDE 20 MG PO TABS: 20.0000 mg | ORAL_TABLET | Freq: Every day | ORAL | 0 refills | Status: DC

## 2018-11-30 NOTE — Progress Notes (Signed)
1.) Reason for visit:  EKG for initiation of flecainide 75 mg bid for afib  2.) Name of MD requesting visit: Dr. Lovena Le  3.) H&P: Pt started flecainide 75 mg bid 2 weeks ago for afib.  Pt thinks he has noticed bilateral lower extremity edema since starting medication.  Of note Pt has CDHF.   Otherwise no c/o on new medication  4.) ROS related to problem:  Pt taking correct dose.  C/o lower extremity edema which may be related to [facility].  Per DOD have Pt take furosemide 20 mg one tablet daily to see if any improvement in edema.  5.) Assessment and plan per MD: EKG reviewed by DOD.  No changes made.  Pt has f/u appt with Dr. Lovena Le on December 08, 2018.

## 2018-11-30 NOTE — Patient Instructions (Signed)
Medication Instructions:  Your physician has recommended you make the following change in your medication:   1.  Take furosemide 20 mg-- Take one tablet by mouth daily x 3 days for edema  Labwork: None ordered.  Testing/Procedures: None ordered.  Follow-Up: Your physician wants you to follow-up with Dr. Lovena Le as scheduled.  Any Other Special Instructions Will Be Listed Below (If Applicable).  If you need a refill on your cardiac medications before your next appointment, please call your pharmacy.

## 2018-12-06 ENCOUNTER — Telehealth: Payer: Self-pay | Admitting: Adult Health

## 2018-12-06 NOTE — Telephone Encounter (Signed)
Prefilled Syringe: #150mg  2  #75mg  0 Ordered Date: 12/06/18 Shipping Date: 12/07/18  [facility] Did not send 12/07/18 shipment. Per Joellen Jersey gave pt samples. Pt's Xolair came in today.  Prefilled Syringes: # 150mg  2  #75mg  0 Arrival Date: 12/10/18 Lot #: 150mg  [id number]      75mg  0 Exp Date: 150mg  05/2019   75mg  0

## 2018-12-08 ENCOUNTER — Ambulatory Visit: Payer: Medicare Other | Admitting: Internal Medicine

## 2018-12-08 ENCOUNTER — Encounter: Payer: Self-pay | Admitting: Internal Medicine

## 2018-12-08 VITALS — BP 126/70 | HR 82 | Ht 70.0 in | Wt 238.0 lb

## 2018-12-08 DIAGNOSIS — I480 Paroxysmal atrial fibrillation: Secondary | ICD-10-CM | POA: Diagnosis not present

## 2018-12-08 DIAGNOSIS — I495 Sick sinus syndrome: Secondary | ICD-10-CM | POA: Diagnosis not present

## 2018-12-08 DIAGNOSIS — Z950 Presence of cardiac pacemaker: Secondary | ICD-10-CM | POA: Diagnosis not present

## 2018-12-08 MED ORDER — AMLODIPINE BESYLATE 5 MG PO TABS: 5.0000 mg | ORAL_TABLET | Freq: Every day | ORAL | 3 refills | Status: DC

## 2018-12-08 MED ORDER — FLECAINIDE ACETATE 150 MG PO TABS: 75.0000 mg | ORAL_TABLET | Freq: Two times a day (BID) | ORAL | 3 refills | Status: DC

## 2018-12-08 NOTE — Patient Instructions (Addendum)
Medication Instructions:  Your physician recommends that you continue on your current medications as directed. Please refer to the Current Medication list given to you today.  Labwork: None ordered.  Testing/Procedures: None ordered.  Follow-Up: Your physician wants you to follow-up in: 6 months with Dr. Lovena Le.   You will receive a reminder letter in the mail two months in advance. If you don't receive a letter, please call our office to schedule the follow-up appointment.  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 This monitoring reduces the number of office visits required to check your device to one time per year. It allows [place] to keep an eye on the functioning of your device to ensure it is working properly. You are scheduled for a device check from home on 01/31/2019. You may send your transmission at any time that day. If you have a wireless device, the transmission will be sent automatically. After your physician reviews your transmission, you will receive a postcard with your next transmission date.  Any Other Special Instructions Will Be Listed Below (If Applicable).  If you need a refill on your cardiac medications before your next appointment, please call your pharmacy.

## 2018-12-08 NOTE — Progress Notes (Signed)
HPI Mr. Tippin returns today for ongoing evaluation and management of sinus node dysfunction, PAF, s/p PPM insertion. Since his last visit a year ago, he has had more sob and peripheral edema.  He had worsening symptomatic atrial fib when I saw him last and we started him on flecainide. He has returned to NSR. He is beginning to breath better and is sleeping better at night. He notes palpitations and dyspnea especially when he exerts himself. Allergies  Allergen Reactions  . Fish Allergy Anaphylaxis and Shortness Of Breath    Only fish with iodine SEAFOOD  . Food Anaphylaxis and Shortness Of Breath    TREE NUTS  . Iodinated Diagnostic Agents Hives and Shortness Of Breath    Patient states hives to throat closing. (01/15/17: patient states this reaction was "about 20 years ago" with possibly an IVP.  He now says high doses of prednisone "throw me into AFib."  He has tolerated CT arthrograms with Benadrly 50mg  PO one hour before injection.  Brita Romp, RN)  . Shellfish Allergy Anaphylaxis    To shellfish, crabs.  Makes him feel like "things are crawling all over" me.  Denies airway issues with these foods.  Brita Romp, RN 01/15/17)  . Goat-Derived Products Hives    GOAT CHEESE GOATS  . Prednisone Palpitations    PRECIPITATES A-FIB  . Clavulanic Acid Diarrhea  . Voltaren [Diclofenac Sodium] Other (See Comments)    Feels like things are crawling on him     Current Outpatient Medications  Medication Sig Dispense Refill  . albuterol (PROVENTIL HFA;VENTOLIN HFA) 108 (90 Base) MCG/ACT inhaler Inhale 2 puffs every 4 (four) hours as needed into the lungs for shortness of breath (only if you can't catch your breath).    . bisoprolol (ZEBETA) 5 MG tablet TAKE 1 AND 1/2 TABLET BY MOUTH TWICE DAILY. 90 tablet 3  . canagliflozin (INVOKANA) 300 MG TABS tablet Take 300 mg by mouth daily before breakfast.    . cloNIDine (CATAPRES) 0.1 MG tablet Take 0.1 mg by mouth 2 (two) times daily.    Marland Kitchen  EPINEPHrine 0.3 mg/0.3 mL IJ SOAJ injection Take as needed    . glipiZIDE (GLUCOTROL XL) 5 MG 24 hr tablet Take 5 mg by mouth daily before breakfast.    . levothyroxine (SYNTHROID, LEVOTHROID) 137 MCG tablet Take 137 mcg by mouth daily before breakfast.     . losartan-hydrochlorothiazide (HYZAAR) 100-25 MG per tablet Take 1 tablet by mouth daily.    . metFORMIN (GLUCOPHAGE-XR) 500 MG 24 hr tablet Take 500 mg by mouth every evening.     . montelukast (SINGULAIR) 10 MG tablet TAKE (1) TABLET BY MOUTH AT BEDTIME. 30 tablet 0  . Multiple Vitamin (MULTIVITAMIN) tablet Take 1 tablet by mouth daily.      Marland Kitchen omeprazole (PRILOSEC OTC) 20 MG tablet Take 20 mg by mouth daily.     . rivaroxaban (XARELTO) 20 MG TABS tablet Take 20 mg daily with supper by mouth.    . simvastatin (ZOCOR) 20 MG tablet Take 20 mg by mouth daily.    Grant Ruts INHUB 500-50 MCG/DOSE AEPB INHALE 1 PUFF INTO THE LUNGS TWICE DAILY. RINSE MOUTH AFTER USE. 60 each 5  . amLODipine (NORVASC) 5 MG tablet Take 1 tablet (5 mg total) by mouth daily. 90 tablet 3  . flecainide (TAMBOCOR) 150 MG tablet Take 0.5 tablets (75 mg total) by mouth 2 (two) times daily. 90 tablet 3   No current facility-administered medications  for this visit.      Past Medical History:  Diagnosis Date  . Allergic rhinitis   . Aortic valve disorder   . Asthma    since childhood- seasonal allergies induced  . Cancer ([place])    Skin cancer- squamous, basal  . Carotid artery stenosis   . Essential hypertension   . Full dentures   . GERD (gastroesophageal reflux disease)   . H/O hiatal hernia   . Hemorrhage of rectum   . Hyperlipidemia   . Hypothyroidism   . 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 OSA (obstructive sleep apnea)   . Osteoarthritis   . Pacemaker    Oct 2005 in [place].  Marland Kitchen PAF (paroxysmal atrial fibrillation) ([place])   . Pneumonia    "several Times" 2015 last time  . Primary localized osteoarthritis of right knee 08/11/2017  . RBBB (right bundle branch block)   .  Sinoatrial node dysfunction (HCC)   . Syncope   . Tricuspid valve disorder   . Type 2 diabetes mellitus (HCC)    Type II    ROS:   All systems reviewed and negative except as noted in the HPI.   Past Surgical History:  Procedure Laterality Date  . A-V CARDIAC PACEMAKER INSERTION     Sick sinus syndrome DDR pacer  . Arthropathy Right 2005   Rebuilding of left thumb and joint   . CARDIAC CATHETERIZATION    . CARDIAC ELECTROPHYSIOLOGY STUDY AND ABLATION  09/2008   for pvcs, Dr. Loralie Champagne  . CARDIOVERSION N/A 12/18/2016   Procedure: CARDIOVERSION;  Surgeon: Pixie Casino, MD;  Location: [place] ENDOSCOPY;  Service: Cardiovascular;  Laterality: N/A;  . [place]   right wrist  . CARPAL TUNNEL RELEASE  05/04/2012   Procedure: CARPAL TUNNEL RELEASE;  Surgeon: Wynonia Sours, MD;  Location: [place];  Service: Orthopedics;  Laterality: Left;  . CARPOMETACARPEL SUSPENSION PLASTY Right 11/16/2014   Procedure: SUSPENSIONPLASTY RIGHT THUMB TENDON TRANSFER ABDUCTOR POLLICUS LONGUS EXCISION TRAPEZIUM;  Surgeon: Daryll Brod, MD;  Location: [place];  Service: Orthopedics;  Laterality: Right;  . CHOLECYSTECTOMY  1994  . CIRCUMCISION    . COLONOSCOPY N/A 03/14/2013   Procedure: COLONOSCOPY;  Surgeon: Daneil Dolin, MD;  [facility];  Service: Endoscopy;  Laterality: N/A;  8:15 AM  . EYE SURGERY     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  . EYE SURGERY  2012   Left eye Corneal transplant- partial- Cataract  . GALLBLADDER SURGERY  12/01/2006  . HAND TENDON SURGERY Left late 1990's   thumb  . HEMORROIDECTOMY  2003  . INJECTION KNEE Left 08/26/2017   Procedure: LEFT KNEE INJECTION;  Surgeon: Elsie Saas, MD;  Location: [place];  Service: Orthopedics;  Laterality: Left;  . left Knee Arthroscopy     [facility] 2011- [facility]  . PARTIAL KNEE ARTHROPLASTY  11/22/2012   Procedure: UNICOMPARTMENTAL KNEE;  Surgeon: Lorn Junes,  MD;  Location: [place];  Service: Orthopedics;  Laterality: Left;  left unicompartmental knee arthroplasty  . PERMANENT PACEMAKER GENERATOR CHANGE N/A 01/12/2013   Procedure: PERMANENT PACEMAKER GENERATOR CHANGE;  Surgeon: Evans Lance, MD;  Location: [facility] CATH LAB;  Service: Cardiovascular;  Laterality: N/A;  . Rotator cuff Surgery  2001   Right shoulder  . TONSILLECTOMY    . TOTAL KNEE ARTHROPLASTY Right 08/26/2017   Procedure: TOTAL KNEE ARTHROPLASTY;  Surgeon: Elsie Saas, MD;  Location: [facility];  Service: Orthopedics;  Laterality:  Right;  . varicose vein reduction       Family History  Problem Relation Age of Onset  . Other Father 34       Sudden Cardiac death  . Pancreatic cancer Mother   . Colon cancer Mother   . Colon cancer Maternal Aunt   . Colon polyps Neg Hx      Social History   Socioeconomic History  . Marital status: Married    Spouse name: Not on file  . Number of children: Not on file  . Years of education: Not on file  . Highest education level: Not on file  Occupational History  . Not on file  Social Needs  . Financial resource strain: Not on file  . Food insecurity:    Worry: Not on file    Inability: Not on file  . Transportation needs:    Medical: Not on file    Non-medical: Not on file  Tobacco Use  . Smoking status: Former Smoker    Packs/day: 1.00    Years: 25.00    Pack years: 25.00    Types: Cigarettes    Start date: 02/20/1958    Last attempt to quit: 02/12/1984    Years since quitting: 34.8  . Smokeless tobacco: Never Used  Substance and Sexual Activity  . Alcohol use: Yes    Alcohol/week: 4.0 standard drinks    Types: 4 Glasses of wine per week  . Drug use: No  . Sexual activity: Not on file  Lifestyle  . Physical activity:    Days per week: Not on file    Minutes per session: Not on file  . Stress: Not on file  Relationships  . Social connections:    Talks on phone: Not on file    Gets together: Not on file    Attends  religious service: Not on file    Active member of club or organization: Not on file    Attends meetings of clubs or organizations: Not on file    Relationship status: Not on file  . Intimate partner violence:    Fear of current or ex partner: Not on file    Emotionally abused: Not on file    Physically abused: Not on file    Forced sexual activity: Not on file  Other Topics Concern  . Not on file  Social History Narrative   Regular exercise: No     BP 126/70   Pulse 82   Ht 5\' 10"  (1.778 m)   Wt 238 lb (108 kg)   BMI 34.15 kg/m   Physical Exam:  Well appearing NAD HEENT: Unremarkable Neck:  6 cm JVD, no thyromegally Lymphatics:  No adenopathy Back:  No CVA tenderness Lungs:  Clear with no wheezes HEART:  Regular rate rhythm, no murmurs, no rubs, no clicks Abd:  soft, positive bowel sounds, no organomegally, no rebound, no guarding Ext:  2 plus pulses, no edema, no cyanosis, no clubbing Skin:  No rashes no nodules Neuro:  CN II through XII intact, motor grossly intact  EKG - nsr with atrial pacing and RBBB  DEVICE  Normal device function.  See PaceArt for details.   Assess/Plan: 1. PAF - he has reverted back to NSR on flecainide 75 bid. He will continue this dose. 2. PPM - his St. Jude device is working normally. We have reprogrammed down the aggressive nature of his rate response and reduced his lower rate to 70/min.  3. Diastolic heart failure - he  still admits to sodium indiscretion. I have encouraged him to take his meds and reduce his salt intake. 4. HTN - his blood pressure is improved. No change.   Mikle Bosworth.D.

## 2018-12-09 ENCOUNTER — Ambulatory Visit (INDEPENDENT_AMBULATORY_CARE_PROVIDER_SITE_OTHER): Payer: Medicare Other

## 2018-12-09 DIAGNOSIS — J4540 Moderate persistent asthma, uncomplicated: Secondary | ICD-10-CM | POA: Diagnosis not present

## 2018-12-09 MED ORDER — OMALIZUMAB 150 MG ~~LOC~~ SOLR
300.0000 mg | Freq: Once | SUBCUTANEOUS | Status: AC
  Administered 2018-12-09: 300 mg via SUBCUTANEOUS

## 2018-12-17 LAB — CUP PACEART INCLINIC DEVICE CHECK
Date Time Interrogation Session: 20191220145309
Implantable Lead Implant Date: 20051106
Implantable Lead Implant Date: 20051106
Implantable Lead Location: 753859
Implantable Lead Location: 753860
Implantable Pulse Generator Implant Date: 20140115
Pulse Gen Model: 2210
Pulse Gen Serial Number: 7439597

## 2018-12-29 LAB — CUP PACEART REMOTE DEVICE CHECK
Battery Remaining Percentage: 91 %
Brady Statistic AP VP Percent: 88 %
Brady Statistic AP VS Percent: 1 %
Brady Statistic AS VP Percent: 4.6 %
Brady Statistic AS VS Percent: 5.7 %
Brady Statistic RA Percent Paced: 89 %
Brady Statistic RV Percent Paced: 93 %
Date Time Interrogation Session: 20191104074236
Implantable Lead Implant Date: 20051106
Implantable Lead Implant Date: 20051106
Implantable Lead Location: 753860
Implantable Pulse Generator Implant Date: 20140115
Lead Channel Impedance Value: 680 Ohm
Lead Channel Pacing Threshold Amplitude: 0.75 V
Lead Channel Pacing Threshold Amplitude: 1 V
Lead Channel Pacing Threshold Pulse Width: 0.4 ms
Lead Channel Pacing Threshold Pulse Width: 0.4 ms
Lead Channel Sensing Intrinsic Amplitude: 12 mV
Lead Channel Setting Pacing Amplitude: 2 V
Lead Channel Setting Pacing Amplitude: 2.5 V
Lead Channel Setting Pacing Pulse Width: 0.4 ms
Lead Channel Setting Sensing Sensitivity: 2 mV
MDC IDC LEAD LOCATION: 753859
MDC IDC MSMT BATTERY REMAINING LONGEVITY: 89 mo
MDC IDC MSMT BATTERY VOLTAGE: 2.95 V
MDC IDC MSMT LEADCHNL RA IMPEDANCE VALUE: 380 Ohm
MDC IDC MSMT LEADCHNL RA SENSING INTR AMPL: 3.5 mV
Pulse Gen Model: 2210
Pulse Gen Serial Number: 7439597

## 2019-01-04 ENCOUNTER — Telehealth: Payer: Self-pay | Admitting: *Deleted

## 2019-01-05 NOTE — Telephone Encounter (Signed)
Called pt., I let him know he has a dose on hand. So he doesn't need any med. Right now. He's just tired of "the goofs calling him." (His words)  I told him I'll call [facility] 01/05/2019. Pt understood.

## 2019-01-11 ENCOUNTER — Ambulatory Visit (INDEPENDENT_AMBULATORY_CARE_PROVIDER_SITE_OTHER): Payer: Medicare Other

## 2019-01-11 DIAGNOSIS — J4540 Moderate persistent asthma, uncomplicated: Secondary | ICD-10-CM | POA: Diagnosis not present

## 2019-01-17 MED ORDER — OMALIZUMAB 150 MG ~~LOC~~ SOLR
150.0000 mg | Freq: Once | SUBCUTANEOUS | Status: AC
  Administered 2019-01-17: 150 mg via SUBCUTANEOUS

## 2019-01-19 MED ORDER — OMALIZUMAB 150 MG ~~LOC~~ SOLR
300.0000 mg | Freq: Once | SUBCUTANEOUS | Status: AC
  Administered 2019-01-11: 300 mg via SUBCUTANEOUS

## 2019-01-19 NOTE — Addendum Note (Signed)
Addended by: Newman Nickels on: 01/19/2019 09:37 AM   Modules accepted: Orders

## 2019-01-19 NOTE — Progress Notes (Signed)
Patient answered the following questions per the hand-written documentation on the Xolair Injection sheet:  Have you had a change in your insurance?  no. Have you been in the hospital in the past 10 days?  no. Do you have a fever?  no. Do you have a cough?  Yes, drainage.

## 2019-01-31 ENCOUNTER — Ambulatory Visit (INDEPENDENT_AMBULATORY_CARE_PROVIDER_SITE_OTHER): Payer: Medicare Other

## 2019-01-31 DIAGNOSIS — I495 Sick sinus syndrome: Secondary | ICD-10-CM

## 2019-01-31 DIAGNOSIS — I10 Essential (primary) hypertension: Secondary | ICD-10-CM

## 2019-02-02 LAB — CUP PACEART REMOTE DEVICE CHECK
Battery Remaining Longevity: 101 mo
Battery Remaining Percentage: 91 %
Battery Voltage: 2.95 V
Brady Statistic AP VP Percent: 1.1 %
Brady Statistic AP VS Percent: 98 %
Brady Statistic AS VP Percent: 1 %
Brady Statistic AS VS Percent: 1 %
Brady Statistic RA Percent Paced: 98 %
Date Time Interrogation Session: 20200203081632
Implantable Lead Implant Date: 20051106
Implantable Lead Implant Date: 20051106
Implantable Lead Location: 753859
Implantable Lead Location: 753860
Implantable Pulse Generator Implant Date: 20140115
Lead Channel Impedance Value: 390 Ohm
Lead Channel Impedance Value: 740 Ohm
Lead Channel Pacing Threshold Amplitude: 0.75 V
Lead Channel Pacing Threshold Amplitude: 1 V
Lead Channel Pacing Threshold Pulse Width: 0.4 ms
Lead Channel Pacing Threshold Pulse Width: 0.4 ms
Lead Channel Sensing Intrinsic Amplitude: 12 mV
Lead Channel Sensing Intrinsic Amplitude: 2.5 mV
Lead Channel Setting Pacing Amplitude: 2 V
Lead Channel Setting Pacing Amplitude: 2.5 V
Lead Channel Setting Pacing Pulse Width: 0.4 ms
Lead Channel Setting Sensing Sensitivity: 2 mV
MDC IDC PG SERIAL: 7439597
MDC IDC STAT BRADY RV PERCENT PACED: 1.1 %
Pulse Gen Model: 2210

## 2019-02-03 NOTE — Progress Notes (Signed)
* [name]     Assessment and Plan:  Severe persistent asthma with severe chronic intractable cough. -Patient has a history of severe asthma with poor control despite maximal treatment with different medications.  He was started on xolair and notes that his breathing/wheezing is much better, however he continues to have symptoms. Some of this may be due to COPD/Chronic bronchitis rather than asthma. Will try increasing xolair to 225 mg. Start spiriva.  -Pneumococcal 13, 08/2015.  Chronic bronchitis. --Excess mucus production with cough.    Lung mass. -  06/23/18>> compared with previous on 03/30/2018; the RLL medial infiltrates have improved, nearly resolved. The LLL medial infiltrates are similarly resolving. There is new subsegmental atelectasis in the RML. Emphysema persists. - Pt today complains of hemoptysis, will recheck CT chest.    Atrial fibrillation. - Has been flared up in the past due to prednisone. -Patient currently on Xarelto.  Obstructive sleep apnea. - Patient was previously diagnosed with sleep apnea, was intolerant to CPAP, therefore stopped.   Date: 02/03/2019  MRN# [id number] AVON MERGENTHALER 1945/12/30   Clarence Dawson is a 73 y.o. old male seen in follow up for chief complaint of  Chief Complaint  Patient presents with  . Follow-up    3 month f/u apt   . Asthma    receiving xolair injections has had 6 so far, feels a little easier to breathe  . Cough    2 years duration, yellow/green/coffee, thick mucus   . Wheezing    sometimes      HPI:  The patient is a 73 year old male, persistent cough and asthma symptoms, which has not responded to multiple medications including Zyrtec, Mucinex, Symbicort, Pepcid, Delsym, tramadol, chlorpheniramine, Tessalon with variable to minimal relief. At last visit he was noted to be doing much better on Xolair.  Today he notes that he has been having a lot of mucus production. He is taking mucinex  once or twice per day. He is using wyxella twice per day, singulair, no antihistamine because he had urinary difficulty with it.   He has a history of OSA, but stopped using it because it was noisy and hated it. He also has trouble tolerating it because of coughing. He notes that occasionally he brings up mucus which has a blood stain on it.   ** Allergy profile 03/11/2016 >  Eos 0.2 /  IgE  70 Pos RAST Mold > dust > cockroach   **Sinus CT 03/14/16 > Mild inflammatory changes s acute change **Spirometry 02/16/2018  FEV1 2.26 (70%)  Ratio 60   **CT chest 03/30/2018; as well as PET scan emphysema, worst in the apices, area of masslike infiltrate in the posterior medial basilar segment, there is mild subcarinal lymphadenopathy.  The right mid zone nodule seen on initial CT chest has resolved, the masslike infiltrate in the right lower lobe persists. **CT chest 06/23/18>> images personally reviewed, compared with previous; the RLL medial infiltrates have improved, nearly resolved. The LLL medial infiltrates are similarly resolving. There is new subsegmental atelectasis in the RML. Emphysema persists.   Medication:    Current Outpatient Medications:  .  albuterol (PROVENTIL HFA;VENTOLIN HFA) 108 (90 Base) MCG/ACT inhaler, Inhale 2 puffs every 4 (four) hours as needed into the lungs for shortness of breath (only if you can't catch your breath)., Disp: , Rfl:  .  amLODipine (NORVASC) 5 MG tablet, Take 1 tablet (5 mg total) by mouth daily., Disp: 90 tablet, Rfl: 3 .  bisoprolol (ZEBETA) 5 MG tablet, TAKE 1 AND 1/2 TABLET BY MOUTH TWICE DAILY., Disp: 90 tablet, Rfl: 3 .  canagliflozin (INVOKANA) 300 MG TABS tablet, Take 300 mg by mouth daily before breakfast., Disp: , Rfl:  .  cloNIDine (CATAPRES) 0.1 MG tablet, Take 0.1 mg by mouth 2 (two) times daily., Disp: , Rfl:  .  EPINEPHrine 0.3 mg/0.3 mL IJ SOAJ injection, Take as needed, Disp: , Rfl:  .  flecainide (TAMBOCOR) 150 MG tablet, Take 0.5 tablets (75 mg  total) by mouth 2 (two) times daily., Disp: 90 tablet, Rfl: 3 .  glipiZIDE (GLUCOTROL XL) 5 MG 24 hr tablet, Take 5 mg by mouth daily before breakfast., Disp: , Rfl:  .  levothyroxine (SYNTHROID, LEVOTHROID) 137 MCG tablet, Take 137 mcg by mouth daily before breakfast. , Disp: , Rfl:  .  losartan-hydrochlorothiazide (HYZAAR) 100-25 MG per tablet, Take 1 tablet by mouth daily., Disp: , Rfl:  .  metFORMIN (GLUCOPHAGE-XR) 500 MG 24 hr tablet, Take 500 mg by mouth every evening. , Disp: , Rfl:  .  montelukast (SINGULAIR) 10 MG tablet, TAKE (1) TABLET BY MOUTH AT BEDTIME., Disp: 30 tablet, Rfl: 0 .  Multiple Vitamin (MULTIVITAMIN) tablet, Take 1 tablet by mouth daily.  , Disp: , Rfl:  .  omeprazole (PRILOSEC OTC) 20 MG tablet, Take 20 mg by mouth daily. , Disp: , Rfl:  .  rivaroxaban (XARELTO) 20 MG TABS tablet, Take 20 mg daily with supper by mouth., Disp: , Rfl:  .  simvastatin (ZOCOR) 20 MG tablet, Take 20 mg by mouth daily., Disp: , Rfl:  .  WIXELA INHUB 500-50 MCG/DOSE AEPB, INHALE 1 PUFF INTO THE LUNGS TWICE DAILY. RINSE MOUTH AFTER USE., Disp: 60 each, Rfl: 5   Allergies:  Fish allergy; Food; Iodinated diagnostic agents; Shellfish allergy; Goat-derived products; Prednisone; Clavulanic acid; and Voltaren [diclofenac sodium]   Review of Systems:  Constitutional: Feels well. Cardiovascular: Denies chest pain, exertional chest pain.  Pulmonary: Denies  pleuritic chest pain.   The remainder of systems were reviewed and were found to be negative other than what is documented in the HPI.    Physical Examination:   VS: BP 120/72 (BP Location: Left Arm, Cuff Size: Normal)   Pulse 75   Ht 5\' 10"  (1.778 m)   Wt 234 lb 3.2 oz (106.2 kg)   SpO2 91%   BMI 33.60 kg/m   General Appearance: No distress  Neuro:without focal findings, mental status, speech normal, alert and oriented HEENT: PERRLA, EOM intact Pulmonary: No wheezing, No rales  CardiovascularNormal S1,S2.  No m/r/g.  Abdomen:  Benign, Soft, non-tender, No masses Renal:  No costovertebral tenderness  GU:  No performed at this time. Endoc: No evident thyromegaly, no signs of acromegaly or Cushing features Skin:   warm, no rashes, no ecchymosis  Extremities: normal, no cyanosis, clubbing.       LABORATORY PANEL:   CBC No results for input(s): WBC, HGB, HCT, PLT in the last 168 hours. ------------------------------------------------------------------------------------------------------------------  Chemistries  No results for input(s): NA, K, CL, CO2, GLUCOSE, BUN, CREATININE, CALCIUM, MG, AST, ALT, ALKPHOS, BILITOT in the last 168 hours.  Invalid input(s): GFRCGP ------------------------------------------------------------------------------------------------------------------  Cardiac Enzymes No results for input(s): TROPONINI in the last 168 hours. ------------------------------------------------------------  RADIOLOGY:   No results found for this or any previous visit. Results for orders placed during the hospital encounter of 03/16/18  DG Chest 2 View   Narrative CLINICAL DATA:  Cough and congestion.  EXAM: CHEST - 2  VIEW  COMPARISON:  03/02/2018.  FINDINGS: Cardiac pacer with lead tips over the right atrium and right ventricle. Heart size normal. Low lung volumes. Mild basilar atelectasis. Interval near complete clearing of right base infiltrate. No pleural effusion or pneumothorax. Degenerative changes thoracic spine.  IMPRESSION: 1. Cardiac pacer with lead tips over the right atrium right ventricle.  2. Low lung volumes with mild basilar atelectasis. Interval clearing scratched it near complete interval clearing of left base infiltrate.   Electronically Signed   By: Marcello Moores  Register   On: 03/16/2018 11:48    ------------------------------------------------------------------------------------------------------------------  Thank  you for allowing [facility] to assist in the care of your patient. Our recommendations are noted above.  Please contact us if we can be of further service.   Marda Stalker, M.D., F.C.C.P.  Board Certified in Internal Medicine, Pulmonary Medicine, [place], and Sleep Medicine.  [facility] Office Number: [phone]   02/03/2019

## 2019-02-04 ENCOUNTER — Encounter: Payer: Self-pay | Admitting: Internal Medicine

## 2019-02-04 ENCOUNTER — Ambulatory Visit: Payer: Medicare Other | Admitting: Internal Medicine

## 2019-02-04 VITALS — BP 120/72 | HR 75 | Ht 70.0 in | Wt 234.2 lb

## 2019-02-04 DIAGNOSIS — J4540 Moderate persistent asthma, uncomplicated: Secondary | ICD-10-CM | POA: Diagnosis not present

## 2019-02-04 DIAGNOSIS — I4819 Other persistent atrial fibrillation: Secondary | ICD-10-CM | POA: Diagnosis not present

## 2019-02-04 DIAGNOSIS — J45991 Cough variant asthma: Secondary | ICD-10-CM

## 2019-02-04 DIAGNOSIS — R911 Solitary pulmonary nodule: Secondary | ICD-10-CM

## 2019-02-04 MED ORDER — TIOTROPIUM BROMIDE MONOHYDRATE 18 MCG IN CAPS: 18.0000 ug | ORAL_CAPSULE | Freq: Every day | RESPIRATORY_TRACT | 12 refills | Status: DC

## 2019-02-04 NOTE — Patient Instructions (Addendum)
Will start spiriva once daily. Will check CT chest Lung function test in 3 months.

## 2019-02-04 NOTE — Addendum Note (Signed)
Addended by: Vivia Ewing on: 02/04/2019 10:46 AM   Modules accepted: Orders

## 2019-02-07 ENCOUNTER — Telehealth: Payer: Self-pay | Admitting: Internal Medicine

## 2019-02-08 NOTE — Progress Notes (Signed)
Remote pacemaker transmission.   

## 2019-02-09 ENCOUNTER — Encounter: Payer: Self-pay | Admitting: Primary Care

## 2019-02-09 ENCOUNTER — Ambulatory Visit (INDEPENDENT_AMBULATORY_CARE_PROVIDER_SITE_OTHER): Payer: Medicare Other

## 2019-02-09 ENCOUNTER — Ambulatory Visit: Payer: Medicare Other | Admitting: Primary Care

## 2019-02-09 DIAGNOSIS — J42 Unspecified chronic bronchitis: Secondary | ICD-10-CM | POA: Insufficient documentation

## 2019-02-09 DIAGNOSIS — J410 Simple chronic bronchitis: Secondary | ICD-10-CM | POA: Diagnosis not present

## 2019-02-09 DIAGNOSIS — J4550 Severe persistent asthma, uncomplicated: Secondary | ICD-10-CM | POA: Diagnosis not present

## 2019-02-09 DIAGNOSIS — J4540 Moderate persistent asthma, uncomplicated: Secondary | ICD-10-CM | POA: Diagnosis not present

## 2019-02-09 MED ORDER — FLUTTER DEVI: 0 refills | Status: DC

## 2019-02-09 NOTE — Patient Instructions (Signed)
Everything looked fine today during our visit. No signs of active infection.   Ok to receive Xolair injection today  Recommend using flutter valve 3-4 times a day to assist with congestion  Continue mucinex once daily  Monitor for fever, shortness of breath or worsening symptoms

## 2019-02-09 NOTE — Progress Notes (Signed)
@Patient  ID: Clarence Dawson, male    DOB: [birth year], 74 y.o.   MRN: [id number]  Chief Complaint  Patient presents with  . Acute Visit    dry cough at times prod with white to greenish mucus x2.5y    Referring provider: [name], MD  HPI: 73 year old male, former smoker quit in 1985 (25+ pack year hx). PMH significant for severe asthma and chronic bronchitis. Patient of Dr. Ashby Dawes in [place]. Maintained on Xolair injections and Spiriva.   02/09/2019 Patient presents today for acute visit for cough. He was here for his xolair injection and told staff that he had a productive cough so an appointment was made for him to be seen prior to receiving injection. He feels well, no acute complaints. Cough is productive and chronic. Takes mucinex once daily with improvement in congestion. Reports that he is able to bring up mucus and symptoms improve throughout the day. Denies fever, chills, aches, shortness of breath or wheezing.    Allergies  Allergen Reactions  . Fish Allergy Anaphylaxis and Shortness Of Breath    Only fish with iodine SEAFOOD  . Food Anaphylaxis and Shortness Of Breath    TREE NUTS  . Iodinated Diagnostic Agents Hives and Shortness Of Breath    Patient states hives to throat closing. (01/15/17: patient states this reaction was "about 20 years ago" with possibly an IVP.  He now says high doses of prednisone "throw me into AFib."  He has tolerated CT arthrograms with Benadrly 50mg  PO one hour before injection.  Brita Romp, RN)  . Shellfish Allergy Anaphylaxis    To shellfish, crabs.  Makes him feel like "things are crawling all over" me.  Denies airway issues with these foods.  Brita Romp, RN 01/15/17)  . Goat-Derived Products Hives    GOAT CHEESE GOATS  . Prednisone Palpitations    PRECIPITATES A-FIB  . Clavulanic Acid Diarrhea  . Voltaren [Diclofenac Sodium] Other (See Comments)    Feels like things are crawling on him    Immunization History    Administered Date(s) Administered  . Influenza Split 11/14/2015, 10/11/2018  . Influenza, High Dose Seasonal PF 09/28/2017  . Influenza-Unspecified 11/02/2016  . Pneumococcal Conjugate-13 09/28/2015    Past Medical History:  Diagnosis Date  . Allergic rhinitis   . Aortic valve disorder   . Asthma    since childhood- seasonal allergies induced  . Cancer ([place])    Skin cancer- squamous, basal  . Carotid artery stenosis   . Essential hypertension   . Full dentures   . GERD (gastroesophageal reflux disease)   . H/O hiatal hernia   . Hemorrhage of rectum   . Hyperlipidemia   . Hypothyroidism   . 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 OSA (obstructive sleep apnea)   . Osteoarthritis   . Pacemaker    Oct 2005 in [place].  Marland Kitchen PAF (paroxysmal atrial fibrillation) ([place])   . Pneumonia    "several Times" 2015 last time  . Primary localized osteoarthritis of right knee 08/11/2017  . RBBB (right bundle branch block)   . Sinoatrial node dysfunction ([facility])   . Syncope   . Tricuspid valve disorder   . Type 2 diabetes mellitus ([facility])    Type II    Tobacco History: Social History   Tobacco Use  Smoking Status Former Smoker  . Packs/day: 1.00  . Years: 25.00  . Pack years: 25.00  . Types: Cigarettes  . Start date: 02/20/1958  . Last attempt to  quit: 02/12/1984  . Years since quitting: 35.0  Smokeless Tobacco Never Used   Counseling given: Not Answered   Outpatient Medications Prior to Visit  Medication Sig Dispense Refill  . albuterol (PROVENTIL HFA;VENTOLIN HFA) 108 (90 Base) MCG/ACT inhaler Inhale 2 puffs every 4 (four) hours as needed into the lungs for shortness of breath (only if you can't catch your breath).    Marland Kitchen amLODipine (NORVASC) 5 MG tablet Take 1 tablet (5 mg total) by mouth daily. 90 tablet 3  . bisoprolol (ZEBETA) 5 MG tablet TAKE 1 AND 1/2 TABLET BY MOUTH TWICE DAILY. 90 tablet 3  . canagliflozin (INVOKANA) 300 MG TABS tablet Take 300 mg by mouth daily before breakfast.    .  cloNIDine (CATAPRES) 0.1 MG tablet Take 0.1 mg by mouth 2 (two) times daily.    Marland Kitchen EPINEPHrine 0.3 mg/0.3 mL IJ SOAJ injection Take as needed    . flecainide (TAMBOCOR) 150 MG tablet Take 0.5 tablets (75 mg total) by mouth 2 (two) times daily. 90 tablet 3  . glipiZIDE (GLUCOTROL XL) 5 MG 24 hr tablet Take 5 mg by mouth daily before breakfast.    . guaiFENesin (MUCINEX) 600 MG 12 hr tablet Take by mouth 2 (two) times daily.    Marland Kitchen levothyroxine (SYNTHROID, LEVOTHROID) 137 MCG tablet Take 137 mcg by mouth daily before breakfast.     . losartan-hydrochlorothiazide (HYZAAR) 100-25 MG per tablet Take 1 tablet by mouth daily.    . metFORMIN (GLUCOPHAGE-XR) 500 MG 24 hr tablet Take 500 mg by mouth every evening.     . montelukast (SINGULAIR) 10 MG tablet TAKE (1) TABLET BY MOUTH AT BEDTIME. 30 tablet 0  . Multiple Vitamin (MULTIVITAMIN) tablet Take 1 tablet by mouth daily.      Marland Kitchen omalizumab (XOLAIR) 150 MG injection Inject 150 mg into the skin every 14 (fourteen) days.    Marland Kitchen omeprazole (PRILOSEC OTC) 20 MG tablet Take 20 mg by mouth daily.     . rivaroxaban (XARELTO) 20 MG TABS tablet Take 20 mg daily with supper by mouth.    . simvastatin (ZOCOR) 20 MG tablet Take 20 mg by mouth daily.    Marland Kitchen tiotropium (SPIRIVA) 18 MCG inhalation capsule Place 1 capsule (18 mcg total) into inhaler and inhale daily for 30 days. 30 capsule 12  . WIXELA INHUB 500-50 MCG/DOSE AEPB INHALE 1 PUFF INTO THE LUNGS TWICE DAILY. RINSE MOUTH AFTER USE. 60 each 5   No facility-administered medications prior to visit.     Review of Systems  Review of Systems  Constitutional: Negative.   Respiratory: Positive for cough. Negative for shortness of breath and wheezing.     Physical Exam  BP 120/78 (BP Location: Right Arm, Cuff Size: Normal)   Pulse 77   Temp 98 F (36.7 C) (Oral)   Ht 5\' 10"  (1.778 m)   Wt 234 lb 12.8 oz (106.5 kg)   SpO2 92%   BMI 33.69 kg/m  Physical Exam Constitutional:      Appearance: Normal  appearance. He is not ill-appearing.  HENT:     Right Ear: Tympanic membrane normal.     Left Ear: Tympanic membrane normal.     Mouth/Throat:     Mouth: Mucous membranes are moist.     Pharynx: Oropharynx is clear.  Cardiovascular:     Rate and Rhythm: Normal rate and regular rhythm.  Pulmonary:     Effort: Pulmonary effort is normal.     Breath sounds: No wheezing.  Comments: Left base rhonchi, clear with cough. No wheezing or resp distress.  Musculoskeletal: Normal range of motion.  Skin:    General: Skin is warm and dry.  Neurological:     General: No focal deficit present.     Mental Status: He is alert and oriented to person, place, and time. Mental status is at baseline.  Psychiatric:        Mood and Affect: Mood normal.        Behavior: Behavior normal.        Thought Content: Thought content normal.        Judgment: Judgment normal.      Lab Results:  CBC    Component Value Date/Time   WBC 6.8 01/19/2018 1016   RBC 5.65 01/19/2018 1016   HGB 17.4 (H) 01/19/2018 1016   HCT 51.2 01/19/2018 1016   PLT 182.0 01/19/2018 1016   MCV 90.7 01/19/2018 1016   MCH 29.6 08/28/2017 0425   MCHC 34.1 01/19/2018 1016   RDW 13.6 01/19/2018 1016   LYMPHSABS 1.7 01/19/2018 1016   MONOABS 0.8 01/19/2018 1016   EOSABS 0.2 01/19/2018 1016   BASOSABS 0.0 01/19/2018 1016    BMET    Component Value Date/Time   NA 141 06/21/2018 0939   K 4.1 06/21/2018 0939   CL 104 06/21/2018 0939   CO2 29 06/21/2018 0939   GLUCOSE 130 (H) 06/21/2018 0939   BUN 25 (H) 06/21/2018 0939   CREATININE 1.10 06/21/2018 0939   CREATININE 1.27 (H) 08/15/2016 0941   CALCIUM 9.4 06/21/2018 0939   GFRNONAA >60 06/21/2018 0939   GFRAA >60 06/21/2018 0939    BNP No results found for: BNP  ProBNP    Component Value Date/Time   PROBNP 95.0 01/19/2018 1016    Imaging: No results found.   Assessment & Plan:   Severe persistent asthma - Ok to receive Xolair injection today, no signs of  active infection - Continue Spiriva - FU with Dr. Ashby Dawes as needed   Chronic bronchitis ([place]) - Chronic cough with mucus production - Exam normal, afebrile - Continue mucinex daily  - Add flutter valve    Martyn Ehrich, NP 02/09/2019

## 2019-02-09 NOTE — Assessment & Plan Note (Signed)
-   Chronic cough with mucus production - Exam normal, afebrile - Continue mucinex daily  - Add flutter valve

## 2019-02-09 NOTE — Assessment & Plan Note (Addendum)
-   Ok to receive Xolair injection today, no signs of active infection - Continue Spiriva - FU with Dr. Ashby Dawes as needed

## 2019-02-15 MED ORDER — OMALIZUMAB 150 MG ~~LOC~~ SOLR
300.0000 mg | SUBCUTANEOUS | Status: DC
  Administered 2019-02-09: 300 mg via SUBCUTANEOUS

## 2019-02-15 NOTE — Progress Notes (Signed)
Xolair injection documentation and charges entered by Maury Dus, RMA, based on injection sheet filled out by [facility] during preparation and administration. This documentation process is due to office requirements.

## 2019-02-16 ENCOUNTER — Other Ambulatory Visit (HOSPITAL_COMMUNITY): Payer: Self-pay | Admitting: Internal Medicine

## 2019-02-16 ENCOUNTER — Other Ambulatory Visit: Payer: Self-pay | Admitting: Internal Medicine

## 2019-02-16 DIAGNOSIS — I739 Peripheral vascular disease, unspecified: Secondary | ICD-10-CM

## 2019-02-22 ENCOUNTER — Ambulatory Visit (HOSPITAL_COMMUNITY)
Admission: RE | Admit: 2019-02-22 | Discharge: 2019-02-22 | Disposition: A | Discharge status: Home / Self Care | Payer: Medicare Other | Source: Ambulatory Visit | Attending: Internal Medicine | Admitting: Internal Medicine

## 2019-02-22 DIAGNOSIS — I739 Peripheral vascular disease, unspecified: Secondary | ICD-10-CM | POA: Insufficient documentation

## 2019-02-24 ENCOUNTER — Other Ambulatory Visit: Payer: Self-pay

## 2019-02-28 ENCOUNTER — Ambulatory Visit: Payer: Medicare Other | Admitting: Vascular Surgery

## 2019-02-28 ENCOUNTER — Encounter: Payer: Self-pay | Admitting: Vascular Surgery

## 2019-02-28 VITALS — BP 128/78 | HR 78 | Temp 97.1°F | Resp 18 | Wt 235.6 lb

## 2019-02-28 DIAGNOSIS — I70213 Atherosclerosis of native arteries of extremities with intermittent claudication, bilateral legs: Secondary | ICD-10-CM

## 2019-02-28 NOTE — Progress Notes (Signed)
Vascular and Vein Specialist of [facility] office  Patient name: Clarence Dawson MRN: [id number] DOB: October 07, 1946 Sex: male  REASON FOR CONSULT: Evaluation of bilateral lower extremity calf claudication  HPI: Clarence Dawson is a 73 y.o. male, who is here today for evaluation of bilateral lower extremity calf claudication.  He is here with his wife.  He reports that over the last 6 to 8 months he has had progressively worsening calf claudication bilaterally.  He reports that he noticed this most particularly when walking back uphill from taking his trash to the curb.  He is otherwise active and reports that this is limiting his enjoyment.  He did have total knee replacement with some restrictions on the right and reports that he did been less active following this.  He is trying to walk more and is having a difficult time due to the claudication.  He does have a history of venous hypertension and underwent downs like ablation of his right saphenous vein at [facility] in 2018 prior to his total knee replacement.  No history of lower extremity tissue loss.  He denies any buttock or thigh claudication.  He does have what sounds like neuropathy in his feet.  Reports that it with prolonged standing he has a numb sensation on the feet.  He did quit smoking many years ago.  He reports that recently he has been trying to engage in a walking program with his wife and reports that the symptoms do appear to be improving with this.  Past Medical History:  Diagnosis Date  . Allergic rhinitis   . Aortic valve disorder   . Asthma    since childhood- seasonal allergies induced  . Cancer ([place])    Skin cancer- squamous, basal  . Carotid artery stenosis   . Essential hypertension   . Full dentures   . GERD (gastroesophageal reflux disease)   . H/O hiatal hernia   . Hemorrhage of rectum   . Hyperlipidemia   . Hypothyroidism   . 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 OSA  (obstructive sleep apnea)   . Osteoarthritis   . Pacemaker    Oct 2005 in [place].  Marland Kitchen PAF (paroxysmal atrial fibrillation) ([place])   . Pneumonia    "several Times" 2015 last time  . Primary localized osteoarthritis of right knee 08/11/2017  . RBBB (right bundle branch block)   . Sinoatrial node dysfunction (HCC)   . Syncope   . Tricuspid valve disorder   . Type 2 diabetes mellitus (HCC)    Type II    Family History  Problem Relation Age of Onset  . Other Father 51       Sudden Cardiac death  . Pancreatic cancer Mother   . Colon cancer Mother   . Colon cancer Maternal Aunt   . Colon polyps Neg Hx     SOCIAL HISTORY: Social History   Socioeconomic History  . Marital status: Married    Spouse name: Not on file  . Number of children: Not on file  . Years of education: Not on file  . Highest education level: Not on file  Occupational History  . Not on file  Social Needs  . Financial resource strain: Not on file  . Food insecurity:    Worry: Not on file    Inability: Not on file  . Transportation needs:    Medical: Not on file    Non-medical: Not on file  Tobacco Use  . Smoking  status: Former Smoker    Packs/day: 1.00    Years: 25.00    Pack years: 25.00    Types: Cigarettes    Start date: 02/20/1958    Last attempt to quit: 02/12/1984    Years since quitting: 35.0  . Smokeless tobacco: Never Used  Substance and Sexual Activity  . Alcohol use: Yes    Alcohol/week: 4.0 standard drinks    Types: 4 Glasses of wine per week  . Drug use: No  . Sexual activity: Not on file  Lifestyle  . Physical activity:    Days per week: Not on file    Minutes per session: Not on file  . Stress: Not on file  Relationships  . Social connections:    Talks on phone: Not on file    Gets together: Not on file    Attends religious service: Not on file    Active member of club or organization: Not on file    Attends meetings of clubs or organizations: Not on file    Relationship  status: Not on file  . Intimate partner violence:    Fear of current or ex partner: Not on file    Emotionally abused: Not on file    Physically abused: Not on file    Forced sexual activity: Not on file  Other Topics Concern  . Not on file  Social History Narrative   Regular exercise: No    Allergies  Allergen Reactions  . Fish Allergy Anaphylaxis and Shortness Of Breath    Only fish with iodine SEAFOOD  . Food Anaphylaxis and Shortness Of Breath    TREE NUTS  . Iodinated Diagnostic Agents Hives and Shortness Of Breath    Patient states hives to throat closing. (01/15/17: patient states this reaction was "about 20 years ago" with possibly an IVP.  He now says high doses of prednisone "throw me into AFib."  He has tolerated CT arthrograms with Benadrly 50mg  PO one hour before injection.  Brita Romp, RN)  . Shellfish Allergy Anaphylaxis    To shellfish, crabs.  Makes him feel like "things are crawling all over" me.  Denies airway issues with these foods.  Brita Romp, RN 01/15/17)  . Goat-Derived Products Hives    GOAT CHEESE GOATS  . Prednisone Palpitations    PRECIPITATES A-FIB  . Clavulanic Acid Diarrhea  . Voltaren [Diclofenac Sodium] Other (See Comments)    Feels like things are crawling on him    Current Outpatient Medications  Medication Sig Dispense Refill  . albuterol (PROVENTIL HFA;VENTOLIN HFA) 108 (90 Base) MCG/ACT inhaler Inhale 2 puffs every 4 (four) hours as needed into the lungs for shortness of breath (only if you can't catch your breath).    Marland Kitchen amLODipine (NORVASC) 5 MG tablet Take 1 tablet (5 mg total) by mouth daily. 90 tablet 3  . bisoprolol (ZEBETA) 5 MG tablet TAKE 1 AND 1/2 TABLET BY MOUTH TWICE DAILY. 90 tablet 3  . canagliflozin (INVOKANA) 300 MG TABS tablet Take 300 mg by mouth daily before breakfast.    . cloNIDine (CATAPRES) 0.1 MG tablet Take 0.1 mg by mouth 2 (two) times daily.    Marland Kitchen EPINEPHrine 0.3 mg/0.3 mL IJ SOAJ injection Take as needed    .  flecainide (TAMBOCOR) 150 MG tablet Take 0.5 tablets (75 mg total) by mouth 2 (two) times daily. 90 tablet 3  . glipiZIDE (GLUCOTROL XL) 5 MG 24 hr tablet Take 5 mg by mouth daily before breakfast.    .  guaiFENesin (MUCINEX) 600 MG 12 hr tablet Take by mouth 2 (two) times daily.    Marland Kitchen levothyroxine (SYNTHROID, LEVOTHROID) 137 MCG tablet Take 137 mcg by mouth daily before breakfast.     . losartan-hydrochlorothiazide (HYZAAR) 100-25 MG per tablet Take 1 tablet by mouth daily.    . metFORMIN (GLUCOPHAGE-XR) 500 MG 24 hr tablet Take 500 mg by mouth every evening.     . montelukast (SINGULAIR) 10 MG tablet TAKE (1) TABLET BY MOUTH AT BEDTIME. 30 tablet 0  . Multiple Vitamin (MULTIVITAMIN) tablet Take 1 tablet by mouth daily.      Marland Kitchen omalizumab (XOLAIR) 150 MG injection Inject 150 mg into the skin every 14 (fourteen) days.    Marland Kitchen omeprazole (PRILOSEC OTC) 20 MG tablet Take 20 mg by mouth daily.     Marland Kitchen Respiratory Therapy Supplies (FLUTTER) DEVI USE AS DIRECTED 1 each 0  . rivaroxaban (XARELTO) 20 MG TABS tablet Take 20 mg daily with supper by mouth.    . simvastatin (ZOCOR) 20 MG tablet Take 20 mg by mouth daily.    Marland Kitchen tiotropium (SPIRIVA) 18 MCG inhalation capsule Place 1 capsule (18 mcg total) into inhaler and inhale daily for 30 days. 30 capsule 12  . WIXELA INHUB 500-50 MCG/DOSE AEPB INHALE 1 PUFF INTO THE LUNGS TWICE DAILY. RINSE MOUTH AFTER USE. 60 each 5   Current Facility-Administered Medications  Medication Dose Route Frequency Provider Last Rate Last Dose  . omalizumab Arvid Right) injection 300 mg  300 mg Subcutaneous Q14 Days [name], MD   300 mg at 02/09/19 1519    REVIEW OF SYSTEMS:  [X]  denotes positive finding, [ ]  denotes negative finding Cardiac  Comments:  Chest pain or chest pressure:    Shortness of breath upon exertion:    Short of breath when lying flat:    Irregular heart rhythm: x       Vascular    Pain in calf, thigh, or hip brought on by ambulation: x   Pain  in feet at night that wakes you up from your sleep:     Blood clot in your veins:    Leg swelling:         Pulmonary    Oxygen at home:    Productive cough:     Wheezing:         Neurologic    Sudden weakness in arms or legs:     Sudden numbness in arms or legs:     Sudden onset of difficulty speaking or slurred speech:    Temporary loss of vision in one eye:     Problems with dizziness:         Gastrointestinal    Blood in stool:     Vomited blood:         Genitourinary    Burning when urinating:     Blood in urine:        Psychiatric    Major depression:         Hematologic    Bleeding problems:    Problems with blood clotting too easily:        Skin    Rashes or ulcers:        Constitutional    Fever or chills:      PHYSICAL EXAM: Vitals:   02/28/19 1030  BP: 128/78  Pulse: 78  Resp: 18  Temp: (!) 97.1 F (36.2 C)  TempSrc: Temporal  Weight: 235 lb 9.6 oz (106.9 kg)  GENERAL: The patient is a well-nourished male, in no acute distress. The vital signs are documented above. CARDIOVASCULAR: Carotid arteries are without bruits bilaterally.  2+ radial and 2+ femoral pulses.  I do not palpate popliteal or pedal pulses PULMONARY: There is good air exchange  ABDOMEN: Soft and non-tender  MUSCULOSKELETAL: There are no major deformities or cyanosis. NEUROLOGIC: No focal weakness or paresthesias are detected. SKIN: There are no ulcers or rashes noted. PSYCHIATRIC: The patient has a normal affect.  DATA:  Noninvasive studies lower extremity at [facility] 02/22/1999 were reviewed.  This shows essentially normal ankle arm index at rest bilaterally  MEDICAL ISSUES: Discussion with the patient and his wife.  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  He did undergo a CT scan of his pelvis for evaluation of thickening and pain in his right inguinal region in October 2019.  He had noncontrast  imaging from his aortic bifurcation to his groin.  This shows extensive calcification and most particularly on the right common femoral artery, the calcified plaque is severely limiting flow through this area.  I do feel that his symptoms are related to arterial claudication despite his normal resting evasive study.  He reports that although this is somewhat limited to him he is able to tolerate it currently.  Also he seems to be having improvement with a walking program.  I have reassured him that he is not causing any harm with walking to the point of claudication and have encouraged him to continue a regimented exercise program.  Did explain the next step would be either CT angiogram or formal arteriogram for further evaluation.  He does have a history of allergy to iodinated contrast and therefore would certainly reserve this for limiting claudication symptoms.  He has been successfully pretreated with Benadryl in the past prior to contrast administration.  He was reassured with our discussion and will see [place] again on an as-needed basis.  He will contact us should he develop progressive symptoms   Rosetta Posner, MD [facility] Vascular and Vein Specialists of [facility] Tel [phone] Pager [phone]

## 2019-03-01 ENCOUNTER — Telehealth: Payer: Self-pay | Admitting: Internal Medicine

## 2019-03-01 NOTE — Telephone Encounter (Signed)
I called [facility] And tried to order pt's Xolair. Rep said they need pt's consent. I called the pt, since rep said they haven't been able to get in touch with him. I asked him,"Didn't you give them a blanket consent for the year?" "Yes, I did, last month. Pt is out and can't call. He said he would listen to his messages and write down the # and call them back today. Waiting to hear from Pharm., or pt.Marland Kitchen

## 2019-03-01 NOTE — Telephone Encounter (Signed)
I haven't heard from anyone. The rep I spoke to said, once they get pt's consent they will be able to set up del.. Still waiting.

## 2019-03-02 NOTE — Telephone Encounter (Signed)
Prefilled Syringe: #150mg  2  #75mg  0 Ordered Date: 02/07/2019 Shipping Date: 02/08/2019  Got busy and didn't get a chance to put order in.  Prefilled Syringes: # 150mg  2 #75mg  0 Arrival Date:02/08/2019 Lot #: 150mg  [id number]      75mg  0 Exp Date: 150mg  11/2019   75mg  0

## 2019-03-02 NOTE — Telephone Encounter (Signed)
I didn't get a chance to put order in. It arrived on: Prefilled Syringes: # 150mg  2  #75mg  0 Arrival Date: 01/07/2019 Lot #: 150mg  [id number]      75mg  0 Exp Date: 150mg  05/2019   75mg  0

## 2019-03-02 NOTE — Telephone Encounter (Signed)
Prefilled Syringe: #150mg  2  #75mg  0 Ordered Date: 03/02/2019 Shipping Date: 03/07/2019 Pt called and gave his consent 03/01/2019. [employer] didn't call us to get confirmation to ship. Rep said Xolair had been ordered and it will be there on 03/08/2019. I'll wait for med to come in, to put in smart text.

## 2019-03-08 NOTE — Telephone Encounter (Signed)
Prefilled Syringes: # 150mg  2  #75mg  0 Arrival Date: 03/08/2019 Lot #: 150mg  [id number]      75mg  0 Exp Date: 150mg  01/2020   75mg  0

## 2019-03-10 ENCOUNTER — Other Ambulatory Visit: Payer: Self-pay

## 2019-03-10 ENCOUNTER — Ambulatory Visit (INDEPENDENT_AMBULATORY_CARE_PROVIDER_SITE_OTHER): Payer: Medicare Other

## 2019-03-10 DIAGNOSIS — J4550 Severe persistent asthma, uncomplicated: Secondary | ICD-10-CM | POA: Diagnosis not present

## 2019-03-10 MED ORDER — OMALIZUMAB 150 MG ~~LOC~~ SOLR
300.0000 mg | Freq: Once | SUBCUTANEOUS | Status: AC
  Administered 2019-03-10: 300 mg via SUBCUTANEOUS

## 2019-03-21 ENCOUNTER — Telehealth: Payer: Self-pay | Admitting: Internal Medicine

## 2019-03-24 NOTE — Telephone Encounter (Signed)
Called pt , made appt for 04/07/2019. Nothing further needed.

## 2019-04-01 ENCOUNTER — Telehealth: Payer: Self-pay | Admitting: Internal Medicine

## 2019-04-01 NOTE — Telephone Encounter (Signed)
Prefilled Syringe: #150mg   Order Date: 04/01/2019 Shipping Date: 04/04/2019

## 2019-04-05 NOTE — Telephone Encounter (Signed)
Prefilled Syringes: # 150mg  2 Arrival Date: 04/05/2019 Lot #: 150mg  [id number] Exp Date: 150mg  01/2020  [place] rc'd 04/05/2019

## 2019-04-07 ENCOUNTER — Ambulatory Visit (INDEPENDENT_AMBULATORY_CARE_PROVIDER_SITE_OTHER): Payer: Medicare Other

## 2019-04-07 ENCOUNTER — Other Ambulatory Visit: Payer: Self-pay

## 2019-04-07 DIAGNOSIS — J4550 Severe persistent asthma, uncomplicated: Secondary | ICD-10-CM | POA: Diagnosis not present

## 2019-04-07 MED ORDER — OMALIZUMAB 150 MG ~~LOC~~ SOLR
300.0000 mg | Freq: Once | SUBCUTANEOUS | Status: AC
  Administered 2019-04-07: 300 mg via SUBCUTANEOUS

## 2019-04-07 NOTE — Progress Notes (Signed)
Have you been hospitalized within the last 10 days?  No Do you have a fever?  No Do you have a cough?  No Do you have a headache or sore throat? No  

## 2019-04-26 ENCOUNTER — Telehealth: Payer: Self-pay | Admitting: Internal Medicine

## 2019-04-26 NOTE — Telephone Encounter (Signed)
Xolair Prefilled Syringe Order: 150mg  Prefilled Syringe:  #2 75mg  Prefilled Syringe: N/A Ordered Date: 04/26/2019 Expected shipping date: 04/28/2019 Ordered by: Desmond Dike, [place] Pharmacy: [facility]

## 2019-04-27 ENCOUNTER — Other Ambulatory Visit: Payer: Self-pay | Admitting: Internal Medicine

## 2019-04-28 NOTE — Telephone Encounter (Signed)
Xolair Prefilled Syringe Received:  150mg  Prefilled Syringe >> quantity >>2, lot # [id number], exp date 01/2020 Medication arrival date: 04/28/2019 Received by: TBS

## 2019-05-05 ENCOUNTER — Ambulatory Visit: Payer: Medicare Other

## 2019-05-05 ENCOUNTER — Ambulatory Visit
Admission: RE | Admit: 2019-05-05 | Discharge: 2019-05-05 | Disposition: A | Discharge status: Home / Self Care | Payer: Medicare Other | Source: Ambulatory Visit | Attending: Internal Medicine | Admitting: Internal Medicine

## 2019-05-05 ENCOUNTER — Other Ambulatory Visit: Payer: Self-pay

## 2019-05-05 DIAGNOSIS — R911 Solitary pulmonary nodule: Secondary | ICD-10-CM | POA: Diagnosis not present

## 2019-05-06 ENCOUNTER — Ambulatory Visit (INDEPENDENT_AMBULATORY_CARE_PROVIDER_SITE_OTHER): Payer: Medicare Other

## 2019-05-06 DIAGNOSIS — J4550 Severe persistent asthma, uncomplicated: Secondary | ICD-10-CM | POA: Diagnosis not present

## 2019-05-06 MED ORDER — OMALIZUMAB 150 MG/ML ~~LOC~~ SOSY
300.0000 mg | PREFILLED_SYRINGE | Freq: Once | SUBCUTANEOUS | Status: AC
  Administered 2019-05-06: 11:00:00 300 mg via SUBCUTANEOUS

## 2019-05-06 NOTE — Progress Notes (Signed)
Have you been hospitalized within the last 10 days?  No Do you have a fever?  No Do you have a cough?  No Do you have a headache or sore throat? No  

## 2019-05-09 ENCOUNTER — Ambulatory Visit (INDEPENDENT_AMBULATORY_CARE_PROVIDER_SITE_OTHER): Payer: Medicare Other | Admitting: *Deleted

## 2019-05-09 ENCOUNTER — Other Ambulatory Visit: Payer: Self-pay

## 2019-05-09 DIAGNOSIS — I495 Sick sinus syndrome: Secondary | ICD-10-CM | POA: Diagnosis not present

## 2019-05-09 DIAGNOSIS — I4891 Unspecified atrial fibrillation: Secondary | ICD-10-CM

## 2019-05-10 ENCOUNTER — Other Ambulatory Visit: Payer: Self-pay | Admitting: Internal Medicine

## 2019-05-10 LAB — CUP PACEART REMOTE DEVICE CHECK
Date Time Interrogation Session: 20200512100444
Implantable Lead Implant Date: 20051106
Implantable Lead Implant Date: 20051106
Implantable Lead Location: 753859
Implantable Lead Location: 753860
Implantable Pulse Generator Implant Date: 20140115
Pulse Gen Model: 2210
Pulse Gen Serial Number: 7439597

## 2019-05-16 ENCOUNTER — Other Ambulatory Visit: Payer: Self-pay | Admitting: Internal Medicine

## 2019-05-16 NOTE — Progress Notes (Signed)
Remote pacemaker transmission.   

## 2019-05-24 ENCOUNTER — Telehealth: Payer: Self-pay | Admitting: Internal Medicine

## 2019-05-24 NOTE — Telephone Encounter (Signed)
Xolair Prefilled Syringe Order: 150mg  Prefilled Syringe:  #2 75mg  Prefilled Syringe: N/A Ordered Date: 05/24/2019 Expected date of arrival: 05/27/2019 Ordered by: Desmond Dike, [place] [facility]acy: [facility]

## 2019-05-27 ENCOUNTER — Other Ambulatory Visit: Payer: Self-pay | Admitting: Internal Medicine

## 2019-05-27 DIAGNOSIS — J441 Chronic obstructive pulmonary disease with (acute) exacerbation: Secondary | ICD-10-CM

## 2019-05-27 NOTE — Telephone Encounter (Signed)
Xolair Prefilled Syringe Received:  150mg  Prefilled Syringe >> quantity 2, lot # [id number], exp date 02/2020 Medication arrival date: 05/27/2019 Received by: TBS

## 2019-06-02 ENCOUNTER — Other Ambulatory Visit: Payer: Self-pay

## 2019-06-03 ENCOUNTER — Other Ambulatory Visit
Admission: RE | Admit: 2019-06-03 | Discharge: 2019-06-03 | Disposition: A | Discharge status: Home / Self Care | Payer: Medicare Other | Source: Ambulatory Visit | Attending: Internal Medicine | Admitting: Internal Medicine

## 2019-06-03 ENCOUNTER — Other Ambulatory Visit: Payer: Self-pay

## 2019-06-03 ENCOUNTER — Ambulatory Visit (INDEPENDENT_AMBULATORY_CARE_PROVIDER_SITE_OTHER): Payer: Medicare Other

## 2019-06-03 DIAGNOSIS — J4540 Moderate persistent asthma, uncomplicated: Secondary | ICD-10-CM | POA: Insufficient documentation

## 2019-06-03 DIAGNOSIS — J4550 Severe persistent asthma, uncomplicated: Secondary | ICD-10-CM

## 2019-06-03 DIAGNOSIS — Z1159 Encounter for screening for other viral diseases: Secondary | ICD-10-CM | POA: Insufficient documentation

## 2019-06-03 LAB — BASIC METABOLIC PANEL
Anion gap: 9 (ref 5–15)
BUN: 23 mg/dL (ref 8–23)
CO2: 30 mmol/L (ref 22–32)
Calcium: 9.3 mg/dL (ref 8.9–10.3)
Chloride: 101 mmol/L (ref 98–111)
Creatinine, Ser: 1.05 mg/dL (ref 0.61–1.24)
GFR calc Af Amer: >60 mL/min (ref >60)
GFR calc non Af Amer: >60 mL/min (ref >60)
Glucose, Bld: 120 mg/dL — ABNORMAL HIGH (ref 70–99)
Potassium: 4 mmol/L (ref 3.5–5.1)
Sodium: 140 mmol/L (ref 135–145)

## 2019-06-03 MED ORDER — OMALIZUMAB 150 MG/ML ~~LOC~~ SOSY
300.0000 mg | PREFILLED_SYRINGE | Freq: Once | SUBCUTANEOUS | Status: AC
  Administered 2019-06-03: 300 mg via SUBCUTANEOUS

## 2019-06-03 NOTE — Progress Notes (Signed)
Have you been hospitalized within the last 10 days?  No Do you have a fever?  No Do you have a cough?  No Do you have a headache or sore throat? No  

## 2019-06-04 LAB — NOVEL CORONAVIRUS, NAA (HOSP ORDER, SEND-OUT TO REF LAB; TAT 18-24 HRS): SARS-CoV-2, NAA: NOT DETECTED

## 2019-06-08 ENCOUNTER — Ambulatory Visit: Payer: Medicare Other

## 2019-06-08 ENCOUNTER — Other Ambulatory Visit: Payer: Self-pay

## 2019-06-08 ENCOUNTER — Ambulatory Visit: Payer: Medicare Other | Attending: Internal Medicine

## 2019-06-08 DIAGNOSIS — J4540 Moderate persistent asthma, uncomplicated: Secondary | ICD-10-CM | POA: Diagnosis not present

## 2019-06-08 MED ORDER — ALBUTEROL SULFATE (2.5 MG/3ML) 0.083% IN NEBU
2.5000 mg | INHALATION_SOLUTION | Freq: Once | RESPIRATORY_TRACT | Status: AC
  Administered 2019-06-08: 2.5 mg via RESPIRATORY_TRACT
  Filled 2019-06-08: qty 3

## 2019-06-14 ENCOUNTER — Telehealth: Payer: Self-pay | Admitting: Internal Medicine

## 2019-06-14 NOTE — Telephone Encounter (Signed)
Called patient for COVID-19 pre-screening for in office visit.  Have you recently traveled any where out of the local area in the last 2 weeks? No  Have you been in close contact with a person diagnosed with COVID-19 within the last 2 weeks? No  Do you currently have any of the following symptoms? If so, when did they start? Cough     Diarrhea  Joint Pain Fever      Muscle Pain  Red eyes Shortness of breath   Abdominal pain Vomiting Loss of smell    Rash   Sore Throat Headache    Weakness  Bruising or bleeding   Okay to proceed with visit 06/16/2019

## 2019-06-15 NOTE — Progress Notes (Addendum)
* [facility]     Assessment and Plan:  Severe persistent asthma with severe chronic intractable cough. -Patient has a history of severe asthma with poor control despite maximal treatment with different medications.  He was started on xolair 225 mg once monthly and notes that his breathing/wheezing is much better, however he continues to have symptoms. Some of this may be due to COPD/Chronic bronchitis rather than asthma. Will try increasing xolair to 225 mg.  -We will consider a Xolair holiday to see if there is significant decline in his symptoms when stopping the medication. -Pneumococcal 13, 08/2015.  COPD/chronic bronchitis. --Excess mucus production with cough.  -Is a lot of problems due to excess mucus production, will consider chronic azithromycin if bronchoscopy results are negative.  Lung mass. -  06/23/18>> compared with previous on 03/30/2018; the RLL medial infiltrates have improved, nearly resolved. The LLL medial infiltrates are similarly resolving. There is new subsegmental atelectasis in the RML. Emphysema persists. - We will plan for bronchoscopy to look for evidence of a right middle lobe obstruction or infection. - Regards to the nodule, appears to be low risk at this time, will repeat CT chest in 1 year to complete 2 years of surveillance.   Atrial fibrillation. - Has been flared up in the past due to prednisone. -Patient currently on Xarelto.  Obstructive sleep apnea. - Patient was previously diagnosed with sleep apnea, was intolerant to CPAP, therefore stopped.  Return in about 1 month (around 07/16/2019) for after bronchsocopy followup for results. .   Date: 06/15/2019  MRN# [id number] Clarence Dawson 02/03/1946   Clarence Dawson is a 73 y.o. old male seen in follow up for chief complaint of  Chief Complaint  Patient presents with  . Follow-up    3 month f/u with PFT, CT scan, and bloodwork, coughing up phlem from coffee colored to light  green, a little wheezing     HPI:  Clarence Dawson is a 73 y.o. male with persistent cough and asthma symptoms, which has not responded to multiple medications including Zyrtec, Mucinex, Symbicort, Pepcid, Delsym, tramadol, chlorpheniramine, Tessalon with variable to minimal relief. At last visit he was noted to be doing much better on Xolair.  He was asked to continue Wixella, Singulair.  At last visit he was having some hemoptysis, therefore CT chest was repeated that showed persistent right middle lobe atelectasis, although previous nodules/infiltrates had resolved with the exception of a small 6 mm nodule in the right lower lobe.  PFT was performed which showed  evidence of mild obstructive lung disease, there was reduced expiratory reserve and diffusion capacity likely from atelectasis due to abdominal obesity.   Today he notes that he has continued to have a lot of mucus production. He is taking mucinex once or twice per day. He is using wyxella twice per day, singulair, no antihistamine because he had urinary difficulty with it. He was tried on spiriva but it did not help for the cough or mucus.  His hemoptysis went away since last visit but came back a couple of weeks ago for a few days.  He remains on xolair, and he has not had further exacerbations of his asthma. His breathing feels easier since then.   He has a history of OSA, but stopped using it because it was noisy and hated it. He also has trouble tolerating it because of coughing. He notes that occasionally he brings up mucus which has a blood stain on it.   **  PFT 06/08/2019>> tracings personally reviewed.  FVC is 91% predicted, FEV1 is 72% predicted, ratio is 58%, there is no significant improvement with bronchodilator.  TLC is 100% predicted, RV to TLC ratio is normal.  DLCO is reduced at 49% predicted.  Flow volume loop is unremarkable. overall this test shows mild obstruction consistent with mild COPD.  There is reduced expiratory  reserve and DLCO, which may be secondary to atelectasis and obesity. **CT chest 05/05/2019>> imaging personally reviewed, there is persistent apical emphysema, previously seen left lower lobe 1.3 cm nodule has resolved as well as a 7 mm nodule in the left upper lobe.  Radiology report notes a stable 6 made MAR nodule in the right lower lobe, stable since 03/30/2018. There is persistent atelectasis of the right middle lobe seen on previous CT chest on 06/23/2018, but not seen previously on CT chest from 03/30/2018. ** Allergy profile 03/11/2016 >  Eos 0.2 /  IgE  70 Pos RAST Mold > dust > cockroach   **Sinus CT 03/14/16 > Mild inflammatory changes s acute change **Spirometry 02/16/2018  FEV1 2.26 (70%)  Ratio 60   **CT chest 03/30/2018; as well as PET scan emphysema, worst in the apices, area of masslike infiltrate in the posterior medial basilar segment, there is mild subcarinal lymphadenopathy.  The right mid zone nodule seen on initial CT chest has resolved, the masslike infiltrate in the right lower lobe persists.   **CT chest 06/23/18>> images personally reviewed, compared with previous; the RLL medial infiltrates have improved, nearly resolved. The LLL medial infiltrates are similarly resolving. There is new subsegmental atelectasis in the RML. Emphysema persists.   Medication:    Current Outpatient Medications:  .  albuterol (PROVENTIL HFA;VENTOLIN HFA) 108 (90 Base) MCG/ACT inhaler, Inhale 2 puffs every 4 (four) hours as needed into the lungs for shortness of breath (only if you can't catch your breath)., Disp: , Rfl:  .  amLODipine (NORVASC) 5 MG tablet, Take 1 tablet (5 mg total) by mouth daily., Disp: 90 tablet, Rfl: 3 .  bisoprolol (ZEBETA) 5 MG tablet, TAKE 1 AND 1/2 TABLET BY MOUTH TWICE DAILY., Disp: 90 tablet, Rfl: 3 .  canagliflozin (INVOKANA) 300 MG TABS tablet, Take 300 mg by mouth daily before breakfast., Disp: , Rfl:  .  cloNIDine (CATAPRES) 0.1 MG tablet, Take 0.1 mg by mouth 2 (two) times  daily., Disp: , Rfl:  .  EPINEPHrine 0.3 mg/0.3 mL IJ SOAJ injection, Take as needed, Disp: , Rfl:  .  flecainide (TAMBOCOR) 150 MG tablet, Take 0.5 tablets (75 mg total) by mouth 2 (two) times daily., Disp: 90 tablet, Rfl: 3 .  glipiZIDE (GLUCOTROL XL) 5 MG 24 hr tablet, Take 5 mg by mouth daily before breakfast., Disp: , Rfl:  .  guaiFENesin (MUCINEX) 600 MG 12 hr tablet, Take by mouth 2 (two) times daily., Disp: , Rfl:  .  levothyroxine (SYNTHROID, LEVOTHROID) 137 MCG tablet, Take 137 mcg by mouth daily before breakfast. , Disp: , Rfl:  .  losartan-hydrochlorothiazide (HYZAAR) 100-25 MG per tablet, Take 1 tablet by mouth daily., Disp: , Rfl:  .  metFORMIN (GLUCOPHAGE-XR) 500 MG 24 hr tablet, Take 500 mg by mouth every evening. , Disp: , Rfl:  .  montelukast (SINGULAIR) 10 MG tablet, TAKE (1) TABLET BY MOUTH AT BEDTIME., Disp: 30 tablet, Rfl: 0 .  Multiple Vitamin (MULTIVITAMIN) tablet, Take 1 tablet by mouth daily.  , Disp: , Rfl:  .  omalizumab (XOLAIR) 150 MG injection, Inject 150 mg  into the skin every 14 (fourteen) days., Disp: , Rfl:  .  omeprazole (PRILOSEC OTC) 20 MG tablet, Take 20 mg by mouth daily. , Disp: , Rfl:  .  Respiratory Therapy Supplies (FLUTTER) DEVI, USE AS DIRECTED, Disp: 1 each, Rfl: 0 .  rivaroxaban (XARELTO) 20 MG TABS tablet, Take 20 mg daily with supper by mouth., Disp: , Rfl:  .  simvastatin (ZOCOR) 20 MG tablet, Take 20 mg by mouth daily., Disp: , Rfl:  .  tiotropium (SPIRIVA) 18 MCG inhalation capsule, Place 1 capsule (18 mcg total) into inhaler and inhale daily for 30 days., Disp: 30 capsule, Rfl: 12 .  WIXELA INHUB 500-50 MCG/DOSE AEPB, INHALE 1 PUFF INTO THE LUNGS TWICE DAILY. RINSE MOUTH AFTER USE., Disp: 180 each, Rfl: 0 .  XOLAIR 150 MG/ML prefilled syringe, INJECT 300MG  SUBCUTANEOUSLY EVERY 4 WEEKS (GIVEN AT MD  OFFICE), Disp: 4 mL, Rfl: 5  Current Facility-Administered Medications:  .  omalizumab Arvid Right) injection 300 mg, 300 mg, Subcutaneous, Q14 Days,  Naamah Boggess, MD, 300 mg at 02/09/19 1519   Allergies:  Fish allergy, Food, Iodinated diagnostic agents, Shellfish allergy, Goat-derived products, Prednisone, Clavulanic acid, and Voltaren [diclofenac sodium]    Review of Systems:  Constitutional: Feels well. Cardiovascular: Denies chest pain, exertional chest pain.  Pulmonary: Denies hemoptysis, pleuritic chest pain.   The remainder of systems were reviewed and were found to be negative other than what is documented in the HPI.    Physical Examination:   VS: BP 128/76 (BP Location: Left Arm, Cuff Size: Normal)   Pulse 92   Temp 98.2 F (36.8 C) (Temporal)   Ht 5\' 10"  (1.778 m)   Wt 238 lb 12.8 oz (108.3 kg)   SpO2 93%   BMI 34.26 kg/m   General Appearance: No distress  Neuro:without focal findings, mental status, speech normal, alert and oriented HEENT: PERRLA, EOM intact Pulmonary: No wheezing, No rales  CardiovascularNormal S1,S2.  No m/r/g.  Abdomen: Benign, Soft, non-tender, No masses Renal:  No costovertebral tenderness  GU:  No performed at this time. Endoc: No evident thyromegaly, no signs of acromegaly or Cushing features Skin:   warm, no rashes, no ecchymosis  Extremities: normal, no cyanosis, clubbing.     LABORATORY PANEL:   CBC No results for input(s): WBC, HGB, HCT, PLT in the last 168 hours. ------------------------------------------------------------------------------------------------------------------  Chemistries  No results for input(s): NA, K, CL, CO2, GLUCOSE, BUN, CREATININE, CALCIUM, MG, AST, ALT, ALKPHOS, BILITOT in the last 168 hours.  Invalid input(s): GFRCGP ------------------------------------------------------------------------------------------------------------------  Cardiac Enzymes No results for input(s): TROPONINI in the last 168 hours. ------------------------------------------------------------  RADIOLOGY:   No results found for this or any previous visit.  Results for orders placed during the hospital encounter of 03/16/18  DG Chest 2 View   Narrative CLINICAL DATA:  Cough and congestion.  EXAM: CHEST - 2 VIEW  COMPARISON:  03/02/2018.  FINDINGS: Cardiac pacer with lead tips over the right atrium and right ventricle. Heart size normal. Low lung volumes. Mild basilar atelectasis. Interval near complete clearing of right base infiltrate. No pleural effusion or pneumothorax. Degenerative changes thoracic spine.  IMPRESSION: 1. Cardiac pacer with lead tips over the right atrium right ventricle.  2. Low lung volumes with mild basilar atelectasis. Interval clearing scratched it near complete interval clearing of left base infiltrate.   Electronically Signed   By: Marcello Moores  Register   On: 03/16/2018 11:48    ------------------------------------------------------------------------------------------------------------------  Thank  you for allowing [facility], Critical Care  to assist in the care of your patient. Our recommendations are noted above.  Please contact us if we can be of further service.   Marda Stalker, M.D., F.C.C.P.  Board Certified in Internal Medicine, Pulmonary Medicine, [place], and Sleep Medicine.  [facility] Office Number: [phone]   06/15/2019

## 2019-06-15 NOTE — H&P (View-Only) (Signed)
* [name]     Assessment and Plan:  Severe persistent asthma with severe chronic intractable cough. -Patient has a history of severe asthma with poor control despite maximal treatment with different medications.  He was started on xolair 225 mg once monthly and notes that his breathing/wheezing is much better, however he continues to have symptoms. Some of this may be due to COPD/Chronic bronchitis rather than asthma. Will try increasing xolair to 225 mg.  -We will consider a Xolair holiday to see if there is significant decline in his symptoms when stopping the medication. -Pneumococcal 13, 08/2015.  COPD/chronic bronchitis. --Excess mucus production with cough.  -Is a lot of problems due to excess mucus production, will consider chronic azithromycin if bronchoscopy results are negative.  Lung mass. -  06/23/18>> compared with previous on 03/30/2018; the RLL medial infiltrates have improved, nearly resolved. The LLL medial infiltrates are similarly resolving. There is new subsegmental atelectasis in the RML. Emphysema persists. - We will plan for bronchoscopy to look for evidence of a right middle lobe obstruction or infection. - Regards to the nodule, appears to be low risk at this time, will repeat CT chest in 1 year to complete 2 years of surveillance.   Atrial fibrillation. - Has been flared up in the past due to prednisone. -Patient currently on Xarelto.  Obstructive sleep apnea. - Patient was previously diagnosed with sleep apnea, was intolerant to CPAP, therefore stopped.  Return in about 1 month (around 07/16/2019) for after bronchsocopy followup for results. .   Date: 06/15/2019  MRN# [id number] Clarence Dawson 08-08-1946   Clarence Dawson is a 73 y.o. old male seen in follow up for chief complaint of  Chief Complaint  Patient presents with  . Follow-up    3 month f/u with PFT, CT scan, and bloodwork, coughing up phlem from coffee colored to light  green, a little wheezing     HPI:  Clarence Dawson is a 73 y.o. male with persistent cough and asthma symptoms, which has not responded to multiple medications including Zyrtec, Mucinex, Symbicort, Pepcid, Delsym, tramadol, chlorpheniramine, Tessalon with variable to minimal relief. At last visit he was noted to be doing much better on Xolair.  He was asked to continue Wixella, Singulair.  At last visit he was having some hemoptysis, therefore CT chest was repeated that showed persistent right middle lobe atelectasis, although previous nodules/infiltrates had resolved with the exception of a small 6 mm nodule in the right lower lobe.  PFT was performed which showed  evidence of mild obstructive lung disease, there was reduced expiratory reserve and diffusion capacity likely from atelectasis due to abdominal obesity.   Today he notes that he has continued to have a lot of mucus production. He is taking mucinex once or twice per day. He is using wyxella twice per day, singulair, no antihistamine because he had urinary difficulty with it. He was tried on spiriva but it did not help for the cough or mucus.  His hemoptysis went away since last visit but came back a couple of weeks ago for a few days.  He remains on xolair, and he has not had further exacerbations of his asthma. His breathing feels easier since then.   He has a history of OSA, but stopped using it because it was noisy and hated it. He also has trouble tolerating it because of coughing. He notes that occasionally he brings up mucus which has a blood stain on it.   **  PFT 06/08/2019>> tracings personally reviewed.  FVC is 91% predicted, FEV1 is 72% predicted, ratio is 58%, there is no significant improvement with bronchodilator.  TLC is 100% predicted, RV to TLC ratio is normal.  DLCO is reduced at 49% predicted.  Flow volume loop is unremarkable. overall this test shows mild obstruction consistent with mild COPD.  There is reduced expiratory  reserve and DLCO, which may be secondary to atelectasis and obesity. **CT chest 05/05/2019>> imaging personally reviewed, there is persistent apical emphysema, previously seen left lower lobe 1.3 cm nodule has resolved as well as a 7 mm nodule in the left upper lobe.  Radiology report notes a stable 6 made MAR nodule in the right lower lobe, stable since 03/30/2018. There is persistent atelectasis of the right middle lobe seen on previous CT chest on 06/23/2018, but not seen previously on CT chest from 03/30/2018. ** Allergy profile 03/11/2016 >  Eos 0.2 /  IgE  70 Pos RAST Mold > dust > cockroach   **Sinus CT 03/14/16 > Mild inflammatory changes s acute change **Spirometry 02/16/2018  FEV1 2.26 (70%)  Ratio 60   **CT chest 03/30/2018; as well as PET scan emphysema, worst in the apices, area of masslike infiltrate in the posterior medial basilar segment, there is mild subcarinal lymphadenopathy.  The right mid zone nodule seen on initial CT chest has resolved, the masslike infiltrate in the right lower lobe persists.   **CT chest 06/23/18>> images personally reviewed, compared with previous; the RLL medial infiltrates have improved, nearly resolved. The LLL medial infiltrates are similarly resolving. There is new subsegmental atelectasis in the RML. Emphysema persists.   Medication:    Current Outpatient Medications:  .  albuterol (PROVENTIL HFA;VENTOLIN HFA) 108 (90 Base) MCG/ACT inhaler, Inhale 2 puffs every 4 (four) hours as needed into the lungs for shortness of breath (only if you can't catch your breath)., Disp: , Rfl:  .  amLODipine (NORVASC) 5 MG tablet, Take 1 tablet (5 mg total) by mouth daily., Disp: 90 tablet, Rfl: 3 .  bisoprolol (ZEBETA) 5 MG tablet, TAKE 1 AND 1/2 TABLET BY MOUTH TWICE DAILY., Disp: 90 tablet, Rfl: 3 .  canagliflozin (INVOKANA) 300 MG TABS tablet, Take 300 mg by mouth daily before breakfast., Disp: , Rfl:  .  cloNIDine (CATAPRES) 0.1 MG tablet, Take 0.1 mg by mouth 2 (two) times  daily., Disp: , Rfl:  .  EPINEPHrine 0.3 mg/0.3 mL IJ SOAJ injection, Take as needed, Disp: , Rfl:  .  flecainide (TAMBOCOR) 150 MG tablet, Take 0.5 tablets (75 mg total) by mouth 2 (two) times daily., Disp: 90 tablet, Rfl: 3 .  glipiZIDE (GLUCOTROL XL) 5 MG 24 hr tablet, Take 5 mg by mouth daily before breakfast., Disp: , Rfl:  .  guaiFENesin (MUCINEX) 600 MG 12 hr tablet, Take by mouth 2 (two) times daily., Disp: , Rfl:  .  levothyroxine (SYNTHROID, LEVOTHROID) 137 MCG tablet, Take 137 mcg by mouth daily before breakfast. , Disp: , Rfl:  .  losartan-hydrochlorothiazide (HYZAAR) 100-25 MG per tablet, Take 1 tablet by mouth daily., Disp: , Rfl:  .  metFORMIN (GLUCOPHAGE-XR) 500 MG 24 hr tablet, Take 500 mg by mouth every evening. , Disp: , Rfl:  .  montelukast (SINGULAIR) 10 MG tablet, TAKE (1) TABLET BY MOUTH AT BEDTIME., Disp: 30 tablet, Rfl: 0 .  Multiple Vitamin (MULTIVITAMIN) tablet, Take 1 tablet by mouth daily.  , Disp: , Rfl:  .  omalizumab (XOLAIR) 150 MG injection, Inject 150 mg  into the skin every 14 (fourteen) days., Disp: , Rfl:  .  omeprazole (PRILOSEC OTC) 20 MG tablet, Take 20 mg by mouth daily. , Disp: , Rfl:  .  Respiratory Therapy Supplies (FLUTTER) DEVI, USE AS DIRECTED, Disp: 1 each, Rfl: 0 .  rivaroxaban (XARELTO) 20 MG TABS tablet, Take 20 mg daily with supper by mouth., Disp: , Rfl:  .  simvastatin (ZOCOR) 20 MG tablet, Take 20 mg by mouth daily., Disp: , Rfl:  .  tiotropium (SPIRIVA) 18 MCG inhalation capsule, Place 1 capsule (18 mcg total) into inhaler and inhale daily for 30 days., Disp: 30 capsule, Rfl: 12 .  WIXELA INHUB 500-50 MCG/DOSE AEPB, INHALE 1 PUFF INTO THE LUNGS TWICE DAILY. RINSE MOUTH AFTER USE., Disp: 180 each, Rfl: 0 .  XOLAIR 150 MG/ML prefilled syringe, INJECT 300MG  SUBCUTANEOUSLY EVERY 4 WEEKS (GIVEN AT MD  OFFICE), Disp: 4 mL, Rfl: 5  Current Facility-Administered Medications:  .  omalizumab Arvid Right) injection 300 mg, 300 mg, Subcutaneous, Q14 Days,  Hiedi Touchton, MD, 300 mg at 02/09/19 1519   Allergies:  Fish allergy, Food, Iodinated diagnostic agents, Shellfish allergy, Goat-derived products, Prednisone, Clavulanic acid, and Voltaren [diclofenac sodium]    Review of Systems:  Constitutional: Feels well. Cardiovascular: Denies chest pain, exertional chest pain.  Pulmonary: Denies hemoptysis, pleuritic chest pain.   The remainder of systems were reviewed and were found to be negative other than what is documented in the HPI.    Physical Examination:   VS: BP 128/76 (BP Location: Left Arm, Cuff Size: Normal)   Pulse 92   Temp 98.2 F (36.8 C) (Temporal)   Ht 5\' 10"  (1.778 m)   Wt 238 lb 12.8 oz (108.3 kg)   SpO2 93%   BMI 34.26 kg/m   General Appearance: No distress  Neuro:without focal findings, mental status, speech normal, alert and oriented HEENT: PERRLA, EOM intact Pulmonary: No wheezing, No rales  CardiovascularNormal S1,S2.  No m/r/g.  Abdomen: Benign, Soft, non-tender, No masses Renal:  No costovertebral tenderness  GU:  No performed at this time. Endoc: No evident thyromegaly, no signs of acromegaly or Cushing features Skin:   warm, no rashes, no ecchymosis  Extremities: normal, no cyanosis, clubbing.     LABORATORY PANEL:   CBC No results for input(s): WBC, HGB, HCT, PLT in the last 168 hours. ------------------------------------------------------------------------------------------------------------------  Chemistries  No results for input(s): NA, K, CL, CO2, GLUCOSE, BUN, CREATININE, CALCIUM, MG, AST, ALT, ALKPHOS, BILITOT in the last 168 hours.  Invalid input(s): GFRCGP ------------------------------------------------------------------------------------------------------------------  Cardiac Enzymes No results for input(s): TROPONINI in the last 168 hours. ------------------------------------------------------------  RADIOLOGY:   No results found for this or any previous visit.  Results for orders placed during the hospital encounter of 03/16/18  DG Chest 2 View   Narrative CLINICAL DATA:  Cough and congestion.  EXAM: CHEST - 2 VIEW  COMPARISON:  03/02/2018.  FINDINGS: Cardiac pacer with lead tips over the right atrium and right ventricle. Heart size normal. Low lung volumes. Mild basilar atelectasis. Interval near complete clearing of right base infiltrate. No pleural effusion or pneumothorax. Degenerative changes thoracic spine.  IMPRESSION: 1. Cardiac pacer with lead tips over the right atrium right ventricle.  2. Low lung volumes with mild basilar atelectasis. Interval clearing scratched it near complete interval clearing of left base infiltrate.   Electronically Signed   By: Marcello Moores  Register   On: 03/16/2018 11:48    ------------------------------------------------------------------------------------------------------------------  Thank  you for allowing [facility], Critical Care  to assist in the care of your patient. Our recommendations are noted above.  Please contact us if we can be of further service.   Marda Stalker, M.D., F.C.C.P.  Board Certified in Internal Medicine, Pulmonary Medicine, [place], and Sleep Medicine.   Pulmonary and Critical Care Office Number: [phone]   06/15/2019

## 2019-06-16 ENCOUNTER — Ambulatory Visit: Payer: Medicare Other | Admitting: Internal Medicine

## 2019-06-16 ENCOUNTER — Other Ambulatory Visit: Payer: Self-pay

## 2019-06-16 ENCOUNTER — Telehealth: Payer: Self-pay | Admitting: Internal Medicine

## 2019-06-16 ENCOUNTER — Encounter: Payer: Self-pay | Admitting: Internal Medicine

## 2019-06-16 VITALS — BP 128/76 | HR 92 | Temp 98.2°F | Ht 70.0 in | Wt 238.8 lb

## 2019-06-16 DIAGNOSIS — J9811 Atelectasis: Secondary | ICD-10-CM

## 2019-06-16 NOTE — Patient Instructions (Addendum)
Will plan for bronchoscopy.  Continue your other medications.  After bronchoscopy we may plan to have a xolair holiday.

## 2019-06-16 NOTE — Telephone Encounter (Signed)
Pt will be having bronch on 06/23/2019 at 1:00 preformed by Dr. Ashby Dawes. covid testing will be 06/20/2019. Dx code:J98.11 Pt is aware and voiced his understanding.   Suanne Marker, please see aobe information regarding bronch. Thanks

## 2019-06-16 NOTE — Telephone Encounter (Signed)
06/16/2019 at 3:25 pm EST spoke with Jenny Reichmann at [facility]. Procedure code [phone] is a valid and billable code which does not require PA. Call Ref # [id number]. Rhonda J Cobb

## 2019-06-20 ENCOUNTER — Other Ambulatory Visit
Admission: RE | Admit: 2019-06-20 | Discharge: 2019-06-20 | Disposition: A | Discharge status: Home / Self Care | Payer: Medicare Other | Source: Ambulatory Visit | Attending: Internal Medicine | Admitting: Internal Medicine

## 2019-06-20 ENCOUNTER — Ambulatory Visit: Payer: Medicare Other | Admitting: Internal Medicine

## 2019-06-20 ENCOUNTER — Other Ambulatory Visit: Payer: Self-pay

## 2019-06-20 DIAGNOSIS — Z1159 Encounter for screening for other viral diseases: Secondary | ICD-10-CM | POA: Insufficient documentation

## 2019-06-21 ENCOUNTER — Encounter: Payer: Medicare Other | Admitting: Internal Medicine

## 2019-06-21 LAB — NOVEL CORONAVIRUS, NAA (HOSP ORDER, SEND-OUT TO REF LAB; TAT 18-24 HRS): SARS-CoV-2, NAA: NOT DETECTED

## 2019-06-23 ENCOUNTER — Encounter
Admission: RE | Disposition: A | Discharge status: Home / Self Care | Payer: Self-pay | Source: Home / Self Care | Attending: Internal Medicine

## 2019-06-23 ENCOUNTER — Encounter: Payer: Self-pay | Admitting: Internal Medicine

## 2019-06-23 ENCOUNTER — Ambulatory Visit
Admission: RE | Admit: 2019-06-23 | Discharge: 2019-06-23 | Disposition: A | Discharge status: Home / Self Care | Payer: Medicare Other | Attending: Internal Medicine | Admitting: Internal Medicine

## 2019-06-23 DIAGNOSIS — J449 Chronic obstructive pulmonary disease, unspecified: Secondary | ICD-10-CM | POA: Diagnosis not present

## 2019-06-23 DIAGNOSIS — J9811 Atelectasis: Secondary | ICD-10-CM | POA: Diagnosis not present

## 2019-06-23 DIAGNOSIS — G4733 Obstructive sleep apnea (adult) (pediatric): Secondary | ICD-10-CM | POA: Diagnosis not present

## 2019-06-23 DIAGNOSIS — Z7984 Long term (current) use of oral hypoglycemic drugs: Secondary | ICD-10-CM | POA: Insufficient documentation

## 2019-06-23 DIAGNOSIS — Z79890 Hormone replacement therapy (postmenopausal): Secondary | ICD-10-CM | POA: Insufficient documentation

## 2019-06-23 DIAGNOSIS — J4550 Severe persistent asthma, uncomplicated: Secondary | ICD-10-CM | POA: Diagnosis present

## 2019-06-23 DIAGNOSIS — Z886 Allergy status to analgesic agent status: Secondary | ICD-10-CM | POA: Diagnosis not present

## 2019-06-23 DIAGNOSIS — I4891 Unspecified atrial fibrillation: Secondary | ICD-10-CM | POA: Insufficient documentation

## 2019-06-23 DIAGNOSIS — Z7901 Long term (current) use of anticoagulants: Secondary | ICD-10-CM | POA: Insufficient documentation

## 2019-06-23 DIAGNOSIS — Z888 Allergy status to other drugs, medicaments and biological substances status: Secondary | ICD-10-CM | POA: Insufficient documentation

## 2019-06-23 DIAGNOSIS — Z79899 Other long term (current) drug therapy: Secondary | ICD-10-CM | POA: Insufficient documentation

## 2019-06-23 HISTORY — PX: FLEXIBLE BRONCHOSCOPY: SHX5094

## 2019-06-23 SURGERY — BRONCHOSCOPY, FLEXIBLE
Anesthesia: Moderate Sedation | Laterality: Right

## 2019-06-23 MED ORDER — FENTANYL CITRATE (PF) 100 MCG/2ML IJ SOLN
INTRAMUSCULAR | Status: AC
  Filled 2019-06-23: qty 8

## 2019-06-23 MED ORDER — BUTAMBEN-TETRACAINE-BENZOCAINE 2-2-14 % EX AERO
1.0000 | INHALATION_SPRAY | Freq: Once | CUTANEOUS | Status: DC
  Filled 2019-06-23: qty 20

## 2019-06-23 MED ORDER — FENTANYL CITRATE (PF) 100 MCG/2ML IJ SOLN
INTRAMUSCULAR | Status: DC | PRN
  Administered 2019-06-23 (×3): 25 ug via INTRAVENOUS

## 2019-06-23 MED ORDER — PHENYLEPHRINE HCL 0.25 % NA SOLN
1.0000 | Freq: Four times a day (QID) | NASAL | Status: DC | PRN
  Filled 2019-06-23: qty 15

## 2019-06-23 MED ORDER — MIDAZOLAM HCL 2 MG/2ML IJ SOLN
INTRAMUSCULAR | Status: AC
  Filled 2019-06-23: qty 12

## 2019-06-23 MED ORDER — SODIUM CHLORIDE 0.9 % IV SOLN: INTRAVENOUS | Status: DC

## 2019-06-23 MED ORDER — MIDAZOLAM HCL 2 MG/2ML IJ SOLN
INTRAMUSCULAR | Status: DC | PRN
  Administered 2019-06-23 (×3): 1 mg via INTRAVENOUS
  Administered 2019-06-23: 2 mg via INTRAVENOUS

## 2019-06-23 MED ORDER — LIDOCAINE HCL 2 % EX GEL
1.0000 "application " | Freq: Once | CUTANEOUS | Status: DC
  Filled 2019-06-23: qty 4250

## 2019-06-23 NOTE — Interval H&P Note (Signed)
History and Physical Interval Note:  06/23/2019 12:45 PM  Clarence Dawson  has presented today for surgery, with the diagnosis of BRONCHATELECTASIS.  The various methods of treatment have been discussed with the patient and family. After consideration of risks, benefits and other options for treatment, the patient has consented to  Procedure(s): FLEXIBLE BRONCHOSCOPY RIGHT (Right) as a surgical intervention.  The patient's history has been reviewed, patient examined, no change in status, stable for surgery.  I have reviewed the patient's chart and labs.  Questions were answered to the patient's satisfaction.     [name]

## 2019-06-23 NOTE — Procedures (Signed)
  [facility]            Bronchoscopy Note   FINDINGS/SUMMARY:  - Copious mucosal secretions throughout both airways with significant mucosal edema and erythema consistent with severe chronic bronchitis. - Mucous plugs seen in the right middle lobe which were suctioned and removed. - Transbronchial brushings and bronchoalveolar lavage performed in the right middle lobe.  Brushings also taken in the superior segment of the right lower lobe.  Indication: Right middle lobe atelectasis. The patient (or their representative) was informed of the risks (including but not limited to bleeding, infection, respiratory failure, lung injury, tooth/oral injury) and benefits of the procedure and gave consent, see chart.   Pre-op diagnosis: Severe chronic bronchitis Post-op diagnosis: Same Estimated blood loss: 5 cc  Medications for procedure:  I was present and supervised conscious sedation of this patient with a total time of 25 minutes.  Procedure description: After obtaining informed consent, timeout was called to confirm the patient the procedure.  Her left and right nares were checked for patency, the left nares appeared to be more patent, therefore the left naris was anesthetized with topical lidocaine.  The bronchoscope was then passed via the left naris to the posterior pharynx, normal movements of the vocal cords was visualized, lidocaine was applied to the vocal cords and posterior pharynx.  Bronchoscope was then passed to the trachea, additional lidocaine was applied to the right and left mainstem bronchi.  An anatomical tour was undertaken.  No endobronchial lesions were noted, there were copious mucosal secretions throughout both airways which were suctioned and removed, there was mucosal edema and erythema throughout both airways consistent with chronic generalized bronchitis.  Specifically when looking at the right middle lobe there was mucosal edema which narrowed the  opening of the right middle lobe, there was also mucus impacted within the right middle lobe bronchus which was suctioned and removed.  Subsequently a transbronchial cytology brushing was performed x2 on the right middle lobe followed by bronchoalveolar lavage.  The bronchoscope was then taken to the superior segment of the right lower lobe, and additional brushing was performed here, no significant mucous plug was seen in this area.  As adequate specimens have been obtained, the bronchoscope was then removed and the patient was recovered.    Condition post procedure: Stable   Complications: None noted    Deep Ashby Dawes, M.D., F.C.C.P. Board Certified in Internal Medicine, Pulmonary Medicine, [place], and Sleep Medicine.  [facility] Office Number: [phone]  06/23/2019

## 2019-06-23 NOTE — OR Nursing (Signed)
Bronch end time 1333

## 2019-06-23 NOTE — Discharge Instructions (Signed)
Flexible Bronchoscopy, Care After  This sheet gives you information about how to care for yourself after your test. Your doctor may also give you more specific instructions. If you have problems or questions, contact your doctor.  Follow these instructions at home:  Eating and drinking   Do not eat or drink anything (not even water) for 2 hours after your test, or until your numbing medicine (local anesthetic) wears off.   When your numbness is gone and your cough and gag reflexes have come back, you may:  ? Eat only soft foods.  ? Slowly drink liquids.   The day after the test, go back to your normal diet.  Driving   Do not drive for 24 hours if you were given a medicine to help you relax (sedative).   Do not drive or use heavy machinery while taking prescription pain medicine.  General instructions     Take over-the-counter and prescription medicines only as told by your doctor.   Return to your normal activities as told. Ask what activities are safe for you.   Do not use any products that have nicotine or tobacco in them. This includes cigarettes and e-cigarettes. If you need help quitting, ask your doctor.   Keep all follow-up visits as told by your doctor. This is important. It is very important if you had a tissue sample (biopsy) taken.  Get help right away if:   You have shortness of breath that gets worse.   You get light-headed.   You feel like you are going to pass out (faint).   You have chest pain.   You cough up:  ? More than a little blood.  ? More blood than before.  Summary   Do not eat or drink anything (not even water) for 2 hours after your test, or until your numbing medicine wears off.   Do not use cigarettes. Do not use e-cigarettes.   Get help right away if you have chest pain.  This information is not intended to replace advice given to you by your health care provider. Make sure you discuss any questions you have with your health care provider.  Document Released: 10/12/2009  Document Revised: 01/02/2017 Document Reviewed: 01/02/2017  Elsevier Interactive Patient Education  2019 Elsevier Inc.

## 2019-06-25 LAB — ACID FAST SMEAR (AFB, MYCOBACTERIA): Acid Fast Smear: NEGATIVE

## 2019-06-25 LAB — CYTOLOGY - NON PAP

## 2019-06-27 ENCOUNTER — Telehealth: Payer: Self-pay | Admitting: Internal Medicine

## 2019-06-27 LAB — CULTURE, BAL-QUANTITATIVE W GRAM STAIN: Culture: 30000 — AB

## 2019-06-27 NOTE — Telephone Encounter (Signed)
Xolair Prefilled Syringe Order: 150mg  Prefilled Syringe:  #2 75mg  Prefilled Syringe: N/A Ordered Date: 06/27/2019 Expected date of arrival: 06/29/2019 Ordered by: Desmond Dike, [place] Pharmacy: [facility]

## 2019-06-28 LAB — ASPERGILLUS ANTIGEN, BAL/SERUM: Aspergillus Ag, BAL/Serum: 1.55 Index — ABNORMAL HIGH (ref 0.00–0.49)

## 2019-06-29 ENCOUNTER — Other Ambulatory Visit: Payer: Self-pay | Admitting: Internal Medicine

## 2019-06-29 MED ORDER — LEVOFLOXACIN 500 MG PO TABS: 500.0000 mg | ORAL_TABLET | Freq: Every day | ORAL | 0 refills | Status: AC

## 2019-06-29 NOTE — Telephone Encounter (Signed)
Xolair Prefilled Syringe Received:  150mg  Prefilled Syringe >> quantity 2, lot # [id number], exp date 03/2020 75mg  Prefilled Syringe >> N/A Medication arrival date: 06/29/2019 Received by: Desmond Dike, [place]

## 2019-06-29 NOTE — Telephone Encounter (Signed)
DR please advise. Thanks 

## 2019-07-04 ENCOUNTER — Ambulatory Visit (INDEPENDENT_AMBULATORY_CARE_PROVIDER_SITE_OTHER): Payer: Medicare Other

## 2019-07-04 ENCOUNTER — Other Ambulatory Visit: Payer: Self-pay

## 2019-07-04 DIAGNOSIS — J4550 Severe persistent asthma, uncomplicated: Secondary | ICD-10-CM | POA: Diagnosis not present

## 2019-07-04 MED ORDER — OMALIZUMAB 150 MG/ML ~~LOC~~ SOSY
300.0000 mg | PREFILLED_SYRINGE | Freq: Once | SUBCUTANEOUS | Status: AC
  Administered 2019-07-04: 300 mg via SUBCUTANEOUS

## 2019-07-04 NOTE — Progress Notes (Signed)
Have you been hospitalized within the last 10 days?  No Do you have a fever?  No Do you have a cough?  No Do you have a headache or sore throat? No  

## 2019-07-07 ENCOUNTER — Other Ambulatory Visit: Payer: Self-pay

## 2019-07-07 ENCOUNTER — Ambulatory Visit (HOSPITAL_COMMUNITY)
Admission: RE | Admit: 2019-07-07 | Discharge: 2019-07-07 | Disposition: A | Discharge status: Home / Self Care | Payer: Medicare Other | Source: Ambulatory Visit | Attending: Internal Medicine | Admitting: Internal Medicine

## 2019-07-07 ENCOUNTER — Other Ambulatory Visit (HOSPITAL_COMMUNITY): Payer: Self-pay | Admitting: Internal Medicine

## 2019-07-07 DIAGNOSIS — R0789 Other chest pain: Secondary | ICD-10-CM | POA: Diagnosis not present

## 2019-07-25 ENCOUNTER — Telehealth: Payer: Self-pay | Admitting: Internal Medicine

## 2019-07-25 NOTE — Telephone Encounter (Signed)
Called patient for COVID-19 pre-screening for in office visit. ° °Have you recently traveled any where out of the local area in the last 2 weeks? No ° °Have you been in close contact with a person diagnosed with COVID-19 or someone awaiting results within the last 2 weeks? No ° °Do you currently have any of the following symptoms? If so, when did they start? °Cough     Diarrhea   Joint Pain °Fever      Muscle Pain   Red eyes °Shortness of breath   Abdominal pain  Vomiting °Loss of smell    Rash    Sore Throat °Headache    Weakness   Bruising or bleeding ° ° °Okay to proceed with visit 07/26/2019  ° ° °

## 2019-07-26 ENCOUNTER — Encounter: Payer: Self-pay | Admitting: Internal Medicine

## 2019-07-26 ENCOUNTER — Ambulatory Visit: Payer: Medicare Other | Admitting: Internal Medicine

## 2019-07-26 ENCOUNTER — Telehealth: Payer: Self-pay | Admitting: Internal Medicine

## 2019-07-26 ENCOUNTER — Other Ambulatory Visit
Admission: RE | Admit: 2019-07-26 | Discharge: 2019-07-26 | Disposition: A | Discharge status: Home / Self Care | Payer: Medicare Other | Source: Ambulatory Visit | Attending: Internal Medicine | Admitting: Internal Medicine

## 2019-07-26 ENCOUNTER — Other Ambulatory Visit: Payer: Self-pay

## 2019-07-26 VITALS — BP 124/72 | HR 86 | Temp 98.0°F | Ht 70.0 in | Wt 234.8 lb

## 2019-07-26 DIAGNOSIS — J9811 Atelectasis: Secondary | ICD-10-CM | POA: Diagnosis not present

## 2019-07-26 DIAGNOSIS — J4550 Severe persistent asthma, uncomplicated: Secondary | ICD-10-CM | POA: Diagnosis not present

## 2019-07-26 DIAGNOSIS — J441 Chronic obstructive pulmonary disease with (acute) exacerbation: Secondary | ICD-10-CM | POA: Diagnosis not present

## 2019-07-26 DIAGNOSIS — J4542 Moderate persistent asthma with status asthmaticus: Secondary | ICD-10-CM | POA: Diagnosis not present

## 2019-07-26 LAB — COMPREHENSIVE METABOLIC PANEL
ALT: 23 U/L (ref 0–44)
AST: 22 U/L (ref 15–41)
Albumin: 4.1 g/dL (ref 3.5–5.0)
Alkaline Phosphatase: 71 U/L (ref 38–126)
Anion gap: 9 (ref 5–15)
BUN: 22 mg/dL (ref 8–23)
CO2: 29 mmol/L (ref 22–32)
Calcium: 9.4 mg/dL (ref 8.9–10.3)
Chloride: 101 mmol/L (ref 98–111)
Creatinine, Ser: 0.98 mg/dL (ref 0.61–1.24)
GFR calc Af Amer: >60 mL/min (ref >60)
GFR calc non Af Amer: >60 mL/min (ref >60)
Glucose, Bld: 129 mg/dL — ABNORMAL HIGH (ref 70–99)
Potassium: 4.2 mmol/L (ref 3.5–5.1)
Sodium: 139 mmol/L (ref 135–145)
Total Bilirubin: 0.9 mg/dL (ref 0.3–1.2)
Total Protein: 7.1 g/dL (ref 6.5–8.1)

## 2019-07-26 LAB — CBC WITH DIFFERENTIAL/PLATELET
Abs Immature Granulocytes: 0.02 10*3/uL (ref 0.00–0.07)
Basophils Absolute: 0 10*3/uL (ref 0.0–0.1)
Basophils Relative: 1 %
Eosinophils Absolute: 0.1 10*3/uL (ref 0.0–0.5)
Eosinophils Relative: 2 %
HCT: 53.1 % — ABNORMAL HIGH (ref 39.0–52.0)
Hemoglobin: 18.1 g/dL — ABNORMAL HIGH (ref 13.0–17.0)
Immature Granulocytes: 0 %
Lymphocytes Relative: 28 %
Lymphs Abs: 1.7 10*3/uL (ref 0.7–4.0)
MCH: 31.3 pg (ref 26.0–34.0)
MCHC: 34.1 g/dL (ref 30.0–36.0)
MCV: 91.7 fL (ref 80.0–100.0)
Monocytes Absolute: 0.7 10*3/uL (ref 0.1–1.0)
Monocytes Relative: 11 %
Neutro Abs: 3.6 10*3/uL (ref 1.7–7.7)
Neutrophils Relative %: 58 %
Platelets: 148 10*3/uL — ABNORMAL LOW (ref 150–400)
RBC: 5.79 MIL/uL (ref 4.22–5.81)
RDW: 12.4 % (ref 11.5–15.5)
WBC: 6.1 10*3/uL (ref 4.0–10.5)
nRBC: 0 % (ref 0.0–0.2)

## 2019-07-26 MED ORDER — TUDORZA PRESSAIR 400 MCG/ACT IN AEPB: 1.0000 | INHALATION_SPRAY | Freq: Two times a day (BID) | RESPIRATORY_TRACT | 0 refills | Status: DC

## 2019-07-26 NOTE — Patient Instructions (Signed)
Will start tudorza 1 puff twice daily. Call us back if it helps and we will give you a prescription for something similar.  We will start you on an antifungal medicine after your labs come back. This may require an authorization from your insurance.

## 2019-07-26 NOTE — Progress Notes (Signed)
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 -Patient has a history of severe asthma with poor control despite maximal treatment with different medications.  He was started on xolair 225 mg once monthly and notes that his breathing/wheezing is much better, however he continues to have symptoms. Some of this may be due to COPD/Chronic bronchitis rather than asthma. Will try increasing xolair to 225 mg.  -We will consider a Xolair holiday to see if there is significant decline in his symptoms when stopping the medication. -Pneumococcal 13, 08/2015. --Elevated aspergillus titer and mildly elevated IgE, suggestive of aspergillosis. Will check baseline labs and start on antifungal agent.   COPD/chronic bronchitis. --Excess mucus production with cough.  -Is a lot of problems due to excess mucus production, will consider chronic azithromycin if bronchoscopy results are negative.  Lung mass. -  06/23/18>> compared with previous on 03/30/2018; the RLL medial infiltrates have improved, nearly resolved. The LLL medial infiltrates are similarly resolving. There is new subsegmental atelectasis in the RML. Emphysema persists. - We will plan for bronchoscopy to look for evidence of a right middle lobe obstruction or infection. - Regards to the nodule, appears to be low risk at this time, will repeat CT chest in 1 year to complete 2 years of surveillance.   Atrial fibrillation. - Has been flared up in the past due to prednisone. -Patient currently on Xarelto.  Obstructive sleep apnea. - Patient was previously diagnosed with sleep apnea, was intolerant to CPAP, therefore stopped.  Return in about 3 months (around 10/26/2019).   Date: 07/26/2019  MRN# [id number] Clarence Dawson [birth year]   Clarence Dawson is a 73 y.o. old male seen in follow up for chief complaint of  Chief Complaint  Patient presents with  . Follow-up   last 5 days has been a little bit more difficult breathing, cough and tickle, hasn't slept well the past two nights, non productive cough, rattling with exhale     HPI:  Clarence Dawson is a 73 y.o. male with persistent cough and asthma symptoms, which has not responded to multiple medications including Zyrtec, Mucinex, Symbicort, Pepcid, Delsym, tramadol, chlorpheniramine, Tessalon with variable to minimal relief. At last visit he was noted to be doing much better on Xolair.  He was asked to continue Wixella, Singulair.  Due to some persistent symptoms the Xolair dose was increased.  Since last visit he underwent a bronchoscopy due to persistent left lower lobe atelectasis and infiltrates with a few episodes of hemoptysis.   At last visit he was having some hemoptysis, therefore CT chest was repeated that showed persistent right middle lobe atelectasis, although previous nodules/infiltrates had resolved with the exception of a small 6 mm nodule in the right lower lobe.  PFT was performed which showed  evidence of mild obstructive lung disease, there was reduced expiratory reserve and diffusion capacity likely from atelectasis due to abdominal obesity.  His bronchoscopy BAL showed elevated titers of Aspergillus antigen of 1.55 (normal 0-0.49).  Cultures showed 30 K count of Pseudomonas, therefore treated with an antibiotic.  Fungal cultures and AFB were negative as were AFB and fungal smears.  today he notes that his breathing is feeling worse over the past few weeks with increasing humidity with increased mucus production. He is not sure that that the increased dose of the xolair has helped day to day but he has not had a flare up.  He  is using wixella twice daily, and rinses mouth after use. He uses proventil, rarely.  He has a horse barn with 3 horses, there are a lot of birds in there with droppings. He is in there about 5 minutes per day.   He has a history of OSA, but stopped using it because it  was noisy and hated it. He also has trouble tolerating it because of coughing. He notes that occasionally he brings up mucus which has a blood stain on it.   **PFT 06/08/2019>> tracings personally reviewed.  FVC is 91% predicted, FEV1 is 72% predicted, ratio is 58%, there is no significant improvement with bronchodilator.  TLC is 100% predicted, RV to TLC ratio is normal.  DLCO is reduced at 49% predicted.  Flow volume loop is unremarkable. overall this test shows mild obstruction consistent with mild COPD.  There is reduced expiratory reserve and DLCO, which may be secondary to atelectasis and obesity. **CT chest 05/05/2019>> imaging personally reviewed, there is persistent apical emphysema, previously seen left lower lobe 1.3 cm nodule has resolved as well as a 7 mm nodule in the left upper lobe.  Radiology report notes a stable 6 made MAR nodule in the right lower lobe, stable since 03/30/2018. There is persistent atelectasis of the right middle lobe seen on previous CT chest on 06/23/2018, but not seen previously on CT chest from 03/30/2018. ** Allergy profile 03/11/2016 >  Eos 0.2 /  IgE  70 Pos RAST Mold > dust > cockroach   **Sinus CT 03/14/16 > Mild inflammatory changes s acute change **Spirometry 02/16/2018  FEV1 2.26 (70%)  Ratio 60   **CT chest 03/30/2018; as well as PET scan emphysema, worst in the apices, area of masslike infiltrate in the posterior medial basilar segment, there is mild subcarinal lymphadenopathy.  The right mid zone nodule seen on initial CT chest has resolved, the masslike infiltrate in the right lower lobe persists.   **CT chest 06/23/18>> images personally reviewed, compared with previous; the RLL medial infiltrates have improved, nearly resolved. The LLL medial infiltrates are similarly resolving. There is new subsegmental atelectasis in the RML. Emphysema persists.   Medication:    Current Outpatient Medications:  .  acetaminophen (TYLENOL) 500 MG tablet, Take 1,000 mg by mouth 3  (three) times daily., Disp: , Rfl:  .  albuterol (ACCUNEB) 0.63 MG/3ML nebulizer solution, Take 3 mLs by nebulization every 4 (four) hours as needed for shortness of breath., Disp: , Rfl:  .  albuterol (PROVENTIL HFA;VENTOLIN HFA) 108 (90 Base) MCG/ACT inhaler, Inhale 2 puffs every 4 (four) hours as needed into the lungs for shortness of breath (only if you can't catch your breath)., Disp: , Rfl:  .  amLODipine (NORVASC) 5 MG tablet, Take 1 tablet (5 mg total) by mouth daily., Disp: 90 tablet, Rfl: 3 .  bisoprolol (ZEBETA) 5 MG tablet, TAKE 1 AND 1/2 TABLET BY MOUTH TWICE DAILY. (Patient taking differently: Take 7.5 mg by mouth 2 (two) times a day. ), Disp: 90 tablet, Rfl: 3 .  canagliflozin (INVOKANA) 300 MG TABS tablet, Take 300 mg by mouth daily before breakfast., Disp: , Rfl:  .  Carboxymethylcellul-Glycerin (LUBRICATING EYE DROPS OP), Place 1 drop into the right eye daily as needed (dry eyes)., Disp: , Rfl:  .  cloNIDine (CATAPRES) 0.1 MG tablet, Take 0.1 mg by mouth 2 (two) times daily., Disp: , Rfl:  .  diphenhydramine-acetaminophen (TYLENOL PM) 25-500 MG TABS tablet, Take 1 tablet by mouth at bedtime., Disp: ,  Rfl:  .  EPINEPHrine 0.3 mg/0.3 mL IJ SOAJ injection, Inject 0.3 mg into the muscle as needed for anaphylaxis. , Disp: , Rfl:  .  flecainide (TAMBOCOR) 150 MG tablet, Take 0.5 tablets (75 mg total) by mouth 2 (two) times daily., Disp: 90 tablet, Rfl: 3 .  glipiZIDE (GLUCOTROL XL) 5 MG 24 hr tablet, Take 5 mg by mouth daily before breakfast., Disp: , Rfl:  .  guaiFENesin (MUCINEX) 600 MG 12 hr tablet, Take 600 mg by mouth 2 (two) times daily. , Disp: , Rfl:  .  hydrocortisone cream 1 %, Apply 1 application topically daily as needed for itching., Disp: , Rfl:  .  levothyroxine (SYNTHROID, LEVOTHROID) 137 MCG tablet, Take 137 mcg by mouth daily before breakfast. , Disp: , Rfl:  .  losartan-hydrochlorothiazide (HYZAAR) 100-25 MG per tablet, Take 1 tablet by mouth daily., Disp: , Rfl:  .   metFORMIN (GLUCOPHAGE-XR) 500 MG 24 hr tablet, Take 1,000 mg by mouth at bedtime. , Disp: , Rfl:  .  montelukast (SINGULAIR) 10 MG tablet, TAKE (1) TABLET BY MOUTH AT BEDTIME. (Patient taking differently: Take 10 mg by mouth at bedtime. ), Disp: 30 tablet, Rfl: 0 .  Multiple Vitamin (MULTIVITAMIN) tablet, Take 1 tablet by mouth daily.  , Disp: , Rfl:  .  omeprazole (PRILOSEC OTC) 20 MG tablet, Take 20 mg by mouth daily. , Disp: , Rfl:  .  Respiratory Therapy Supplies (FLUTTER) DEVI, USE AS DIRECTED, Disp: 1 each, Rfl: 0 .  rivaroxaban (XARELTO) 20 MG TABS tablet, Take 20 mg daily with supper by mouth., Disp: , Rfl:  .  simvastatin (ZOCOR) 20 MG tablet, Take 20 mg by mouth at bedtime. , Disp: , Rfl:  .  WIXELA INHUB 500-50 MCG/DOSE AEPB, INHALE 1 PUFF INTO THE LUNGS TWICE DAILY. RINSE MOUTH AFTER USE. (Patient taking differently: Inhale 1 puff into the lungs 2 (two) times a day. ), Disp: 180 each, Rfl: 0 .  XOLAIR 150 MG/ML prefilled syringe, INJECT 300MG SUBCUTANEOUSLY EVERY 4 WEEKS (GIVEN AT MD  OFFICE) (Patient taking differently: Inject 300 mg into the skin every 28 (twenty-eight) days. ), Disp: 4 mL, Rfl: 5  Current Facility-Administered Medications:  .  omalizumab Arvid Right) injection 300 mg, 300 mg, Subcutaneous, Q14 Days, Kaiyah Eber, MD, 300 mg at 02/09/19 1519   Allergies:  Food, Iodinated diagnostic agents, Shellfish allergy, Goat-derived products, Prednisone, Metformin and related, and Voltaren [diclofenac sodium]   Review of Systems:  Constitutional: Feels well. Cardiovascular: Denies chest pain, exertional chest pain.  Pulmonary: Denies hemoptysis, pleuritic chest pain.   The remainder of systems were reviewed and were found to be negative other than what is documented in the HPI.    Physical Examination:   VS: BP 124/72 (BP Location: Left Arm, Cuff Size: Normal)   Pulse 86   Temp 98 F (36.7 C) (Temporal)   Ht _0  (1.778 m)   Wt 234 lb 12.8 oz (106.5 kg)    SpO2 90%   BMI 33.69 kg/m   General Appearance: No distress  Neuro:without focal findings, mental status, speech normal, alert and oriented HEENT: PERRLA, EOM intact Pulmonary: No wheezing, No rales  CardiovascularNormal S1,S2.  No m/r/g.  Abdomen: Benign, Soft, non-tender, No masses Renal:  No costovertebral tenderness  GU:  No performed at this time. Endoc: No evident thyromegaly, no signs of acromegaly or Cushing features Skin:   warm, no rashes, no ecchymosis  Extremities: normal, no cyanosis, clubbing.     LABORATORY PANEL:  CBC No results for input(s): WBC, HGB, HCT, PLT in the last 168 hours. ------------------------------------------------------------------------------------------------------------------  Chemistries  No results for input(s): NA, K, CL, CO2, GLUCOSE, BUN, CREATININE, CALCIUM, MG, AST, ALT, ALKPHOS, BILITOT in the last 168 hours.  Invalid input(s): GFRCGP ------------------------------------------------------------------------------------------------------------------  Cardiac Enzymes No results for input(s): TROPONINI in the last 168 hours. ------------------------------------------------------------  RADIOLOGY:   No results found for this or any previous visit. Results for orders placed during the hospital encounter of 03/16/18  DG Chest 2 View   Narrative CLINICAL DATA:  Cough and congestion.  EXAM: CHEST - 2 VIEW  COMPARISON:  03/02/2018.  FINDINGS: Cardiac pacer with lead tips over the right atrium and right ventricle. Heart size normal. Low lung volumes. Mild basilar atelectasis. Interval near complete clearing of right base infiltrate. No pleural effusion or pneumothorax. Degenerative changes thoracic spine.  IMPRESSION: 1. Cardiac pacer with lead tips over the right atrium right ventricle.  2. Low lung volumes with mild basilar atelectasis. Interval clearing scratched it near complete interval clearing of left base  infiltrate.   Electronically Signed   By: Marcello Moores  Register   On: 03/16/2018 11:48    ------------------------------------------------------------------------------------------------------------------  Thank  you for allowing [facility] to assist in the care of your patient. Our recommendations are noted above.  Please contact us if we can be of further service.   Marda Stalker, M.D., F.C.C.P.  Board Certified in Internal Medicine, Pulmonary Medicine, [place], and Sleep Medicine.  [facility] Office Number: [phone]   07/26/2019

## 2019-07-26 NOTE — Telephone Encounter (Signed)
Xolair Prefilled Syringe Order: 150mg  Prefilled Syringe:  #2 75mg  Prefilled Syringe: N/A Ordered Date: 07/26/2019 Expected date of arrival: 07/28/2019 Ordered by: Desmond Dike, [place]: Claria Dice

## 2019-07-26 NOTE — Addendum Note (Signed)
Addended by: Lebron Conners on: 07/26/2019 01:29 PM   Modules accepted: Orders

## 2019-07-27 LAB — FUNGUS CULTURE WITH STAIN

## 2019-07-27 LAB — FUNGUS CULTURE RESULT

## 2019-07-27 LAB — FUNGAL ORGANISM REFLEX

## 2019-07-28 NOTE — Telephone Encounter (Signed)
Xolair Prefilled Syringe Received:  150mg  Prefilled Syringe >> quantity #2, lot # [id number], exp date 04/28/2020 75mg  Prefilled Syringe >> N/A Medication arrival date: 07/28/19  Received by: Parke Poisson, CMA

## 2019-07-29 ENCOUNTER — Other Ambulatory Visit: Payer: Medicare Other

## 2019-07-29 DIAGNOSIS — J441 Chronic obstructive pulmonary disease with (acute) exacerbation: Secondary | ICD-10-CM

## 2019-07-31 LAB — NOVEL CORONAVIRUS, NAA: SARS-CoV-2, NAA: NOT DETECTED

## 2019-08-01 ENCOUNTER — Ambulatory Visit: Payer: Medicare Other

## 2019-08-01 DIAGNOSIS — B449 Aspergillosis, unspecified: Secondary | ICD-10-CM

## 2019-08-01 NOTE — Telephone Encounter (Signed)
Dr. Alva Garnet please advise, as Dr. Ashby Dawes is on vacation and unavailable. Thanks

## 2019-08-02 ENCOUNTER — Other Ambulatory Visit: Payer: Self-pay

## 2019-08-02 ENCOUNTER — Ambulatory Visit (INDEPENDENT_AMBULATORY_CARE_PROVIDER_SITE_OTHER): Payer: Medicare Other

## 2019-08-02 DIAGNOSIS — J4550 Severe persistent asthma, uncomplicated: Secondary | ICD-10-CM | POA: Diagnosis not present

## 2019-08-02 MED ORDER — OMALIZUMAB 150 MG/ML ~~LOC~~ SOSY
300.0000 mg | PREFILLED_SYRINGE | Freq: Once | SUBCUTANEOUS | Status: AC
  Administered 2019-08-02: 300 mg via SUBCUTANEOUS

## 2019-08-02 NOTE — Progress Notes (Signed)
Have you been hospitalized within the last 10 days?  No Do you have a fever?  No Do you have a cough?  No Do you have a headache or sore throat? No  

## 2019-08-04 NOTE — Telephone Encounter (Addendum)
It does not appear that PA has been started for a medication at this time.  Per Dr. Mathis Fare last office note, pt would be started on an antifungal medicine after his labs came back. I have not been instructed to start a PA as of yet.   Response from pt: My breathing is good. I have had NO fever since Saturday, 07/30/19. It never got over 101 degrees F. The hemoptysis is still on going. The blood work is complete. My cover test came back NEGATIVE. Thank you for your responses. I guess we wait until Dr. Ashby Dawes returns to see where we go next.

## 2019-08-04 NOTE — Telephone Encounter (Signed)
DR please advise regarding medication and labs? Thanks

## 2019-08-04 NOTE — Telephone Encounter (Signed)
There is a drug to drug with simvastatin and xarelto for voriconazole.   DR please advise.

## 2019-08-07 LAB — ACID FAST CULTURE WITH REFLEXED SENSITIVITIES (MYCOBACTERIA): Acid Fast Culture: NEGATIVE

## 2019-08-08 MED ORDER — TUDORZA PRESSAIR 400 MCG/ACT IN AEPB: INHALATION_SPRAY | RESPIRATORY_TRACT | 2 refills | Status: DC

## 2019-08-08 MED ORDER — VORICONAZOLE 200 MG PO TABS: 200.0000 mg | ORAL_TABLET | Freq: Two times a day (BID) | ORAL | 0 refills | Status: DC

## 2019-08-08 NOTE — Addendum Note (Signed)
Addended by: Maryanna Shape A on: 08/08/2019 09:53 AM   Modules accepted: Orders

## 2019-08-08 NOTE — Telephone Encounter (Signed)
Pt is aware that Rx has been sent to preferred pharmacy. Nothing further is needed.

## 2019-08-08 NOTE — Telephone Encounter (Signed)
Pt also requested Rx for Caprice Renshaw, as this medication is working well for him Rx has been sent to preferred pharmacy. Nothing further is needed.

## 2019-08-09 ENCOUNTER — Ambulatory Visit (INDEPENDENT_AMBULATORY_CARE_PROVIDER_SITE_OTHER): Payer: Medicare Other | Admitting: *Deleted

## 2019-08-09 DIAGNOSIS — I495 Sick sinus syndrome: Secondary | ICD-10-CM

## 2019-08-09 DIAGNOSIS — I4510 Unspecified right bundle-branch block: Secondary | ICD-10-CM

## 2019-08-09 LAB — CUP PACEART REMOTE DEVICE CHECK
Battery Remaining Longevity: 90 mo
Battery Remaining Percentage: 81 %
Battery Voltage: 2.93 V
Brady Statistic AP VP Percent: 1.5 %
Brady Statistic AP VS Percent: 98 %
Brady Statistic AS VP Percent: 1 %
Brady Statistic AS VS Percent: 1 %
Brady Statistic RA Percent Paced: 99 %
Brady Statistic RV Percent Paced: 1.5 %
Date Time Interrogation Session: 20200810071809
Implantable Lead Implant Date: 20051106
Implantable Lead Implant Date: 20051106
Implantable Lead Location: 753859
Implantable Lead Location: 753860
Implantable Pulse Generator Implant Date: 20140115
Lead Channel Impedance Value: 390 Ohm
Lead Channel Impedance Value: 700 Ohm
Lead Channel Pacing Threshold Amplitude: 0.75 V
Lead Channel Pacing Threshold Amplitude: 1 V
Lead Channel Pacing Threshold Pulse Width: 0.4 ms
Lead Channel Pacing Threshold Pulse Width: 0.4 ms
Lead Channel Sensing Intrinsic Amplitude: 12 mV
Lead Channel Sensing Intrinsic Amplitude: 2.5 mV
Lead Channel Setting Pacing Amplitude: 2 V
Lead Channel Setting Pacing Amplitude: 2.5 V
Lead Channel Setting Pacing Pulse Width: 0.4 ms
Lead Channel Setting Sensing Sensitivity: 2 mV
Pulse Gen Model: 2210
Pulse Gen Serial Number: 7439597

## 2019-08-18 NOTE — Progress Notes (Signed)
Remote pacemaker transmission.   

## 2019-08-22 ENCOUNTER — Telehealth: Payer: Self-pay | Admitting: Internal Medicine

## 2019-08-22 NOTE — Telephone Encounter (Signed)
Xolair Prefilled Syringe Order: 150mg  Prefilled Syringe:  #2 75mg  Prefilled Syringe: N/A Ordered Date: 08/22/2019 Expected date of arrival: 08/24/2019 Ordered by: Desmond Dike, [place]: [facility]

## 2019-08-24 NOTE — Telephone Encounter (Signed)
Xolair Prefilled Syringe Received:  150mg  Prefilled Syringe >> quantity 2, lot # [id number], exp date 05/29/2020 75mg  Prefilled Syringe >> N/A Medication arrival date: 08/24/19 Received by: Lattie Haw T,LPN

## 2019-08-25 ENCOUNTER — Encounter: Payer: Self-pay | Admitting: Internal Medicine

## 2019-08-25 ENCOUNTER — Other Ambulatory Visit: Payer: Self-pay

## 2019-08-25 ENCOUNTER — Ambulatory Visit (INDEPENDENT_AMBULATORY_CARE_PROVIDER_SITE_OTHER): Payer: Medicare Other | Admitting: Internal Medicine

## 2019-08-25 VITALS — BP 124/70 | HR 76 | Ht 70.0 in | Wt 234.2 lb

## 2019-08-25 DIAGNOSIS — Z950 Presence of cardiac pacemaker: Secondary | ICD-10-CM

## 2019-08-25 DIAGNOSIS — I4819 Other persistent atrial fibrillation: Secondary | ICD-10-CM

## 2019-08-25 DIAGNOSIS — I495 Sick sinus syndrome: Secondary | ICD-10-CM

## 2019-08-25 NOTE — Patient Instructions (Signed)
Medication Instructions:  Your physician recommends that you continue on your current medications as directed. Please refer to the Current Medication list given to you today.  Labwork: None ordered.  Testing/Procedures: None ordered.  Follow-Up: Your physician wants you to follow-up in: one year with Dr. Lovena Le.   You will receive a reminder letter in the mail two months in advance. If you don't receive a letter, please call our office to schedule the follow-up appointment.  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 This monitoring reduces the number of office visits required to check your device to one time per year. It allows [place] to keep an eye on the functioning of your device to ensure it is working properly. You are scheduled for a device check from home on 11/09/2019. You may send your transmission at any time that day. If you have a wireless device, the transmission will be sent automatically. After your physician reviews your transmission, you will receive a postcard with your next transmission date.  Any Other Special Instructions Will Be Listed Below (If Applicable).  If you need a refill on your cardiac medications before your next appointment, please call your pharmacy.

## 2019-08-25 NOTE — Progress Notes (Signed)
HPI Mr. Innocent returns today for ongoing evaluation and management of sinus node dysfunction, PAF, s/p PPM insertion. Since his last visit several months ago,he has had no worsening sob or peripheral edema.He has maintained NSR on flecainide. He is beginning to breath better and is sleeping better at night. He has had no limitations. Allergies  Allergen Reactions  . Food Anaphylaxis and Shortness Of Breath    TREE NUTS  . Iodinated Diagnostic Agents Hives and Shortness Of Breath    Patient states hives to throat closing. (01/15/17: patient states this reaction was "about 20 years ago" with possibly an IVP.  He now says high doses of prednisone "throw me into AFib."  He has tolerated CT arthrograms with Benadrly 74m PO one hour before injection.  JBrita Romp RN)  . Shellfish Allergy Anaphylaxis and Shortness Of Breath    To shellfish, crabs.  Makes him feel like "things are crawling all over" me.  Denies airway issues with these foods.  (Brita Romp RN 01/15/17)  . Goat-Derived PChild psychotherapist  . Prednisone Palpitations    PRECIPITATES A-FIB  . Metformin And Related Diarrhea    High doses at once  . Voltaren [Diclofenac Sodium] Other (See Comments)    Feels like things are crawling on him     Current Outpatient Medications  Medication Sig Dispense Refill  . acetaminophen (TYLENOL) 500 MG tablet Take 1,000 mg by mouth 3 (three) times daily.    . Aclidinium Bromide (TUDORZA PRESSAIR) 400 MCG/ACT AEPB 1 puff BID 1 each 2  . albuterol (ACCUNEB) 0.63 MG/3ML nebulizer solution Take 3 mLs by nebulization every 4 (four) hours as needed for shortness of breath.    .Marland Kitchenalbuterol (PROVENTIL HFA;VENTOLIN HFA) 108 (90 Base) MCG/ACT inhaler Inhale 2 puffs every 4 (four) hours as needed into the lungs for shortness of breath (only if you can't catch your breath).    .Marland KitchenamLODipine (NORVASC) 5 MG tablet Take 1 tablet (5 mg total) by mouth daily. 90 tablet 3  . bisoprolol (ZEBETA)  5 MG tablet TAKE 1 AND 1/2 TABLET BY MOUTH TWICE DAILY. 90 tablet 3  . canagliflozin (INVOKANA) 300 MG TABS tablet Take 300 mg by mouth daily before breakfast.    . Carboxymethylcellul-Glycerin (LUBRICATING EYE DROPS OP) Place 1 drop into the right eye daily as needed (dry eyes).    . cloNIDine (CATAPRES) 0.1 MG tablet Take 0.1 mg by mouth 2 (two) times daily.    . diphenhydramine-acetaminophen (TYLENOL PM) 25-500 MG TABS tablet Take 1 tablet by mouth at bedtime.    .Marland KitchenEPINEPHrine 0.3 mg/0.3 mL IJ SOAJ injection Inject 0.3 mg into the muscle as needed for anaphylaxis.     . flecainide (TAMBOCOR) 150 MG tablet Take 0.5 tablets (75 mg total) by mouth 2 (two) times daily. 90 tablet 3  . glipiZIDE (GLUCOTROL XL) 5 MG 24 hr tablet Take 5 mg by mouth daily before breakfast.    . guaiFENesin (MUCINEX) 600 MG 12 hr tablet Take 600 mg by mouth 2 (two) times daily.     . hydrocortisone cream 1 % Apply 1 application topically daily as needed for itching.    . levothyroxine (SYNTHROID, LEVOTHROID) 137 MCG tablet Take 137 mcg by mouth daily before breakfast.     . losartan-hydrochlorothiazide (HYZAAR) 100-25 MG per tablet Take 1 tablet by mouth daily.    . metFORMIN (GLUCOPHAGE-XR) 500 MG 24 hr tablet Take 1,000 mg by mouth at  bedtime.     . montelukast (SINGULAIR) 10 MG tablet TAKE (1) TABLET BY MOUTH AT BEDTIME. 30 tablet 0  . Multiple Vitamin (MULTIVITAMIN) tablet Take 1 tablet by mouth daily.      Marland Kitchen omeprazole (PRILOSEC OTC) 20 MG tablet Take 20 mg by mouth daily.     Marland Kitchen Respiratory Therapy Supplies (FLUTTER) DEVI USE AS DIRECTED 1 each 0  . rivaroxaban (XARELTO) 20 MG TABS tablet Take 20 mg daily with supper by mouth.    . voriconazole (VFEND) 200 MG tablet Take 1 tablet (200 mg total) by mouth 2 (two) times daily. 180 tablet 0  . WIXELA INHUB 500-50 MCG/DOSE AEPB INHALE 1 PUFF INTO THE LUNGS TWICE DAILY. RINSE MOUTH AFTER USE. 180 each 0  . XOLAIR 150 MG/ML prefilled syringe INJECT 300MG SUBCUTANEOUSLY  EVERY 4 WEEKS (GIVEN AT MD  OFFICE) 4 mL 5   Current Facility-Administered Medications  Medication Dose Route Frequency Provider Last Rate Last Dose  . omalizumab Arvid Right) injection 300 mg  300 mg Subcutaneous Q14 Days [name], MD   300 mg at 02/09/19 1519     Past Medical History:  Diagnosis Date  . Allergic rhinitis   . Aortic valve disorder   . Asthma    since childhood- seasonal allergies induced  . Cancer ([place])    Skin cancer- squamous, basal  . Carotid artery stenosis   . Essential hypertension   . Full dentures   . GERD (gastroesophageal reflux disease)   . H/O hiatal hernia   . Hemorrhage of rectum   . Hyperlipidemia   . Hypothyroidism   . 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 OSA (obstructive sleep apnea)   . Osteoarthritis   . Pacemaker    Oct 2005 in [place].  Marland Kitchen PAF (paroxysmal atrial fibrillation) ([place])   . Pneumonia    "several Times" 2015 last time  . Primary localized osteoarthritis of right knee 08/11/2017  . RBBB (right bundle branch block)   . Sinoatrial node dysfunction (HCC)   . Syncope   . Tricuspid valve disorder   . Type 2 diabetes mellitus (HCC)    Type II    ROS:   All systems reviewed and negative except as noted in the HPI.   Past Surgical History:  Procedure Laterality Date  . A-V CARDIAC PACEMAKER INSERTION     Sick sinus syndrome DDR pacer  . Arthropathy Right 2005   Rebuilding of left thumb and joint   . CARDIAC CATHETERIZATION    . CARDIAC ELECTROPHYSIOLOGY STUDY AND ABLATION  09/2008   for pvcs, Dr. Loralie Champagne  . CARDIOVERSION N/A 12/18/2016   Procedure: CARDIOVERSION;  Surgeon: Pixie Casino, MD;  Location: [facility] ENDOSCOPY;  Service: Cardiovascular;  Laterality: N/A;  . [place]   right wrist  . CARPAL TUNNEL RELEASE  05/04/2012   Procedure: CARPAL TUNNEL RELEASE;  Surgeon: Wynonia Sours, MD;  Location: [place];  Service: Orthopedics;  Laterality: Left;  . CARPOMETACARPEL SUSPENSION PLASTY Right  11/16/2014   Procedure: SUSPENSIONPLASTY RIGHT THUMB TENDON TRANSFER ABDUCTOR POLLICUS LONGUS EXCISION TRAPEZIUM;  Surgeon: Daryll Brod, MD;  Location: [place];  Service: Orthopedics;  Laterality: Right;  . CHOLECYSTECTOMY  1994  . CIRCUMCISION    . COLONOSCOPY N/A 03/14/2013   Procedure: COLONOSCOPY;  Surgeon: Daneil Dolin, MD;  [facility];  Service: Endoscopy;  Laterality: N/A;  8:15 AM  . 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  EYE SURGERY  2012   Left eye Corneal transplant- partial- Cataract  . FLEXIBLE BRONCHOSCOPY Right 06/23/2019   Procedure: FLEXIBLE BRONCHOSCOPY RIGHT;  Surgeon: [name], MD;  Location: [facility] ORS;  Service: Pulmonary;  Laterality: Right;  . GALLBLADDER SURGERY  12/01/2006  . HAND TENDON SURGERY Left late 1990's   thumb  . HEMORROIDECTOMY  2003  . INJECTION KNEE Left 08/26/2017   Procedure: LEFT KNEE INJECTION;  Surgeon: Elsie Saas, MD;  Location: [place];  Service: Orthopedics;  Laterality: Left;  . left Knee Arthroscopy     [facility]- [facility]  . PARTIAL KNEE ARTHROPLASTY  11/22/2012   Procedure: UNICOMPARTMENTAL KNEE;  Surgeon: Lorn Junes, MD;  Location: [place];  Service: Orthopedics;  Laterality: Left;  left unicompartmental knee arthroplasty  . PERMANENT PACEMAKER GENERATOR CHANGE N/A 01/12/2013   Procedure: PERMANENT PACEMAKER GENERATOR CHANGE;  Surgeon: Evans Lance, MD;  [facility] CATH LAB;  Service: Cardiovascular;  Laterality: N/A;  . Rotator cuff Surgery  2001   Right shoulder  . TONSILLECTOMY    . TOTAL KNEE ARTHROPLASTY Right 08/26/2017   Procedure: TOTAL KNEE ARTHROPLASTY;  Surgeon: Elsie Saas, MD;  Location: [place];  Service: Orthopedics;  Laterality: Right;  . varicose vein reduction       Family History  Problem Relation Age of Onset  . Other Father 62       Sudden Cardiac death  . Pancreatic cancer Mother   . Colon cancer Mother   . Colon cancer  Maternal Aunt   . Colon polyps Neg Hx      Social History   Socioeconomic History  . Marital status: Married    Spouse name: Not on file  . Number of children: Not on file  . Years of education: Not on file  . Highest education level: Not on file  Occupational History  . Not on file  Social Needs  . Financial resource strain: Not on file  . Food insecurity    Worry: Not on file    Inability: Not on file  . Transportation needs    Medical: Not on file    Non-medical: Not on file  Tobacco Use  . Smoking status: Former Smoker    Packs/day: 1.00    Years: 25.00    Pack years: 25.00    Types: Cigarettes    Start date: 02/20/1958    Quit date: 02/12/1984    Years since quitting: 35.5  . Smokeless tobacco: Former Systems developer    Types: Chew  Substance and Sexual Activity  . Alcohol use: Yes    Alcohol/week: 4.0 standard drinks    Types: 4 Glasses of wine per week  . Drug use: No  . Sexual activity: Not on file  Lifestyle  . Physical activity    Days per week: Not on file    Minutes per session: Not on file  . Stress: Not on file  Relationships  . Social Herbalist on phone: Not on file    Gets together: Not on file    Attends religious service: Not on file    Active member of club or organization: Not on file    Attends meetings of clubs or organizations: Not on file    Relationship status: Not on file  . Intimate partner violence    Fear of current or ex partner: Not on file    Emotionally abused: Not on file    Physically abused: Not on file  Forced sexual activity: Not on file  Other Topics Concern  . Not on file  Social History Narrative   Regular exercise: No     BP 124/70   Pulse 76   Ht _0  (1.778 m)   Wt 234 lb 3.2 oz (106.2 kg)   SpO2 90%   BMI 33.60 kg/m   Physical Exam:  Well appearing NAD HEENT: Unremarkable Neck:  No JVD, no thyromegally Lymphatics:  No adenopathy Back:  No CVA tenderness Lungs:  Clear with no wheezes HEART:   Regular rate rhythm, no murmurs, no rubs, no clicks Abd:  soft, positive bowel sounds, no organomegally, no rebound, no guarding Ext:  2 plus pulses, no edema, no cyanosis, no clubbing Skin:  No rashes no nodules Neuro:  CN II through XII intact, motor grossly intact  EKG - nsr with atrial pacing  DEVICE  Normal device function.  See PaceArt for details.   Assess/Plan: 1. PAF - he is maintaining NSR very nicely on flecainide. 2. HTN -his blood pressure is well controlled. No change. 3. PPM - his St. Jude DDD PM is working normally. We will recheck in several months. 4. Diastolic heart failure - in NSR, his symptoms are well controlled. He will maintain a low sodium diet. He will continue his current dose of diuretic. He appears to be class 1.   Mikle Bosworth.D.

## 2019-09-02 ENCOUNTER — Ambulatory Visit (INDEPENDENT_AMBULATORY_CARE_PROVIDER_SITE_OTHER): Payer: Medicare Other

## 2019-09-02 ENCOUNTER — Other Ambulatory Visit: Payer: Self-pay

## 2019-09-02 DIAGNOSIS — J4550 Severe persistent asthma, uncomplicated: Secondary | ICD-10-CM | POA: Diagnosis not present

## 2019-09-02 MED ORDER — OMALIZUMAB 150 MG/ML ~~LOC~~ SOSY
300.0000 mg | PREFILLED_SYRINGE | Freq: Once | SUBCUTANEOUS | Status: AC
  Administered 2019-09-02: 09:00:00 300 mg via SUBCUTANEOUS

## 2019-09-02 NOTE — Progress Notes (Signed)
Have you been hospitalized within the last 10 days?  No Do you have a fever?  No Do you have a cough?  No Do you have a headache or sore throat? No Do you have your Epi Pen visible and is it within date?  Yes 

## 2019-09-12 ENCOUNTER — Ambulatory Visit: Payer: Medicare Other | Admitting: Internal Medicine

## 2019-09-12 ENCOUNTER — Encounter: Payer: Self-pay | Admitting: Internal Medicine

## 2019-09-12 ENCOUNTER — Other Ambulatory Visit
Admission: RE | Admit: 2019-09-12 | Discharge: 2019-09-12 | Disposition: A | Discharge status: Home / Self Care | Payer: Medicare Other | Source: Ambulatory Visit | Attending: Internal Medicine | Admitting: Internal Medicine

## 2019-09-12 ENCOUNTER — Other Ambulatory Visit: Payer: Self-pay

## 2019-09-12 VITALS — BP 138/78 | HR 86 | Temp 97.4°F | Ht 70.0 in | Wt 233.4 lb

## 2019-09-12 DIAGNOSIS — B449 Aspergillosis, unspecified: Secondary | ICD-10-CM | POA: Insufficient documentation

## 2019-09-12 DIAGNOSIS — J181 Lobar pneumonia, unspecified organism: Secondary | ICD-10-CM | POA: Insufficient documentation

## 2019-09-12 DIAGNOSIS — Z23 Encounter for immunization: Secondary | ICD-10-CM

## 2019-09-12 LAB — COMPREHENSIVE METABOLIC PANEL
ALT: 39 U/L (ref 0–44)
AST: 35 U/L (ref 15–41)
Albumin: 3.8 g/dL (ref 3.5–5.0)
Alkaline Phosphatase: 78 U/L (ref 38–126)
Anion gap: 10 (ref 5–15)
BUN: 23 mg/dL (ref 8–23)
CO2: 27 mmol/L (ref 22–32)
Calcium: 9 mg/dL (ref 8.9–10.3)
Chloride: 101 mmol/L (ref 98–111)
Creatinine, Ser: 1.07 mg/dL (ref 0.61–1.24)
GFR calc Af Amer: >60 mL/min (ref >60)
GFR calc non Af Amer: >60 mL/min (ref >60)
Glucose, Bld: 198 mg/dL — ABNORMAL HIGH (ref 70–99)
Potassium: 3.9 mmol/L (ref 3.5–5.1)
Sodium: 138 mmol/L (ref 135–145)
Total Bilirubin: 1 mg/dL (ref 0.3–1.2)
Total Protein: 7.1 g/dL (ref 6.5–8.1)

## 2019-09-12 LAB — CBC WITH DIFFERENTIAL/PLATELET
Abs Immature Granulocytes: 0.01 10*3/uL (ref 0.00–0.07)
Basophils Absolute: 0 10*3/uL (ref 0.0–0.1)
Basophils Relative: 1 %
Eosinophils Absolute: 0.2 10*3/uL (ref 0.0–0.5)
Eosinophils Relative: 3 %
HCT: 51.9 % (ref 39.0–52.0)
Hemoglobin: 17.1 g/dL — ABNORMAL HIGH (ref 13.0–17.0)
Immature Granulocytes: 0 %
Lymphocytes Relative: 25 %
Lymphs Abs: 1.4 10*3/uL (ref 0.7–4.0)
MCH: 30.4 pg (ref 26.0–34.0)
MCHC: 32.9 g/dL (ref 30.0–36.0)
MCV: 92.2 fL (ref 80.0–100.0)
Monocytes Absolute: 0.6 10*3/uL (ref 0.1–1.0)
Monocytes Relative: 11 %
Neutro Abs: 3.3 10*3/uL (ref 1.7–7.7)
Neutrophils Relative %: 60 %
Platelets: 192 10*3/uL (ref 150–400)
RBC: 5.63 MIL/uL (ref 4.22–5.81)
RDW: 12.9 % (ref 11.5–15.5)
WBC: 5.4 10*3/uL (ref 4.0–10.5)
nRBC: 0 % (ref 0.0–0.2)

## 2019-09-12 NOTE — Patient Instructions (Signed)
Continue voriconazole, will check blood work.  Continue inhalers.

## 2019-09-12 NOTE — Progress Notes (Signed)
* [facility]     Assessment and Plan:  Severe persistent asthma with severe chronic intractable cough. -Patient has a history of severe asthma with poor control despite maximal treatment with different medications.  He was started on xolair 225 mg once monthly and notes that his breathing/wheezing is  better, however he continues to have symptoms. Some of this may be due to COPD/Chronic bronchitis rather than asthma.  -Pneumococcal 13, 08/2015. Flu 09/12/19. --Elevated aspergillus titer and mildly elevated IgE, suggestive of aspergillosis, continue voriconazole.   Sun exposed rash.  --Likely secondary to voriconazole. Discussed limiting sun exposure, and using an antihistamine in case there is an allergy.   COPD/chronic bronchitis. --Excess mucus production with cough.  -Is a lot of problems due to excess mucus production, will consider chronic azithromycin once the patient is off vori.   Lung mass. -  06/23/18>> compared with previous on 03/30/2018; the RLL medial infiltrates have improved, nearly resolved. The LLL medial infiltrates are similarly resolving. There is new subsegmental atelectasis in the RML. Emphysema persists. - We will plan for bronchoscopy to look for evidence of a right middle lobe obstruction or infection. - Regards to the nodule, appears to be low risk at this time, will repeat CT chest in May of 2021 to complete 2 years of surveillance.   Atrial fibrillation. - Has been flared up in the past due to prednisone. -Patient currently on Xarelto.  Obstructive sleep apnea. - Patient was previously diagnosed with sleep apnea, was intolerant to CPAP, therefore stopped.  No follow-ups on file.   Date: 09/12/2019  MRN# [id number] Clarence Dawson 09-14-1946   Clarence Dawson is a 73 y.o. old male seen in follow up for chief complaint of  Chief Complaint  Patient presents with  . Follow-up    pt has developed rash on arms, forehead and under eyes  since starting Vfend. rash developed developed after being in sun.      HPI:  Clarence Dawson is a 73 y.o. male with persistent cough and asthma symptoms, which has not responded to multiple medications including Zyrtec, Mucinex, Symbicort, Pepcid, Delsym, tramadol, chlorpheniramine, Tessalon with variable to minimal relief. At last visit he was noted to be doing much better on Xolair.  He was asked to continue Wixella, Singulair.  Due to some persistent symptoms the Xolair dose was increased.  Since last visit he underwent a bronchoscopy due to persistent left lower lobe atelectasis and infiltrates with a few episodes of hemoptysis.   At last visit he was having some hemoptysis, therefore CT chest was repeated that showed persistent right middle lobe atelectasis, although previous nodules/infiltrates had resolved with the exception of a small 6 mm nodule in the right lower lobe.  PFT was performed which showed  evidence of mild obstructive lung disease, there was reduced expiratory reserve and diffusion capacity likely from atelectasis due to abdominal obesity. He has a horse barn with 3 horses, there are a lot of birds in there with droppings. He is in there about 5 minutes per day.  His bronchoscopy BAL showed elevated titers of Aspergillus antigen of 1.55 (normal 0-0.49).  Cultures showed 30 K count of Pseudomonas, therefore treated with an antibiotic.  Fungal cultures and AFB were negative as were AFB and fungal smears.  Today he notes that he has an itchy rash on sun exposed areas, which have been itchy. He tried benadryl, which made him sleepy but not sure that it did anything for the  rash. He notes that he has been a bit winded the past few weeks. He continues to have a productive cough. He is taking mucinex which helps. He continues to take xolair, every month.  He is taking todorza twice daily, wixela twice daily.    He has a history of OSA, but stopped using it because it was noisy and hated  it. He also has trouble tolerating it because of coughing. He notes that occasionally he brings up mucus which has a blood stain on it.   **PFT 06/08/2019>> tracings personally reviewed.  FVC is 91% predicted, FEV1 is 72% predicted, ratio is 58%, there is no significant improvement with bronchodilator.  TLC is 100% predicted, RV to TLC ratio is normal.  DLCO is reduced at 49% predicted.  Flow volume loop is unremarkable. overall this test shows mild obstruction consistent with mild COPD.  There is reduced expiratory reserve and DLCO, which may be secondary to atelectasis and obesity. **CT chest 05/05/2019>> imaging personally reviewed, there is persistent apical emphysema, previously seen left lower lobe 1.3 cm nodule has resolved as well as a 7 mm nodule in the left upper lobe.  Radiology report notes a stable 6 made MAR nodule in the right lower lobe, stable since 03/30/2018. There is persistent atelectasis of the right middle lobe seen on previous CT chest on 06/23/2018, but not seen previously on CT chest from 03/30/2018. ** Allergy profile 03/11/2016 >  Eos 0.2 /  IgE  70 Pos RAST Mold > dust > cockroach   **Sinus CT 03/14/16 > Mild inflammatory changes s acute change **Spirometry 02/16/2018  FEV1 2.26 (70%)  Ratio 60   **CT chest 03/30/2018; as well as PET scan emphysema, worst in the apices, area of masslike infiltrate in the posterior medial basilar segment, there is mild subcarinal lymphadenopathy.  The right mid zone nodule seen on initial CT chest has resolved, the masslike infiltrate in the right lower lobe persists.   **CT chest 06/23/18>> images personally reviewed, compared with previous; the RLL medial infiltrates have improved, nearly resolved. The LLL medial infiltrates are similarly resolving. There is new subsegmental atelectasis in the RML. Emphysema persists.   Medication:    Current Outpatient Medications:  .  acetaminophen (TYLENOL) 500 MG tablet, Take 1,000 mg by mouth 3 (three) times daily.,  Disp: , Rfl:  .  Aclidinium Bromide (TUDORZA PRESSAIR) 400 MCG/ACT AEPB, 1 puff BID, Disp: 1 each, Rfl: 2 .  albuterol (PROVENTIL HFA;VENTOLIN HFA) 108 (90 Base) MCG/ACT inhaler, Inhale 2 puffs every 4 (four) hours as needed into the lungs for shortness of breath (only if you can't catch your breath)., Disp: , Rfl:  .  amLODipine (NORVASC) 5 MG tablet, Take 1 tablet (5 mg total) by mouth daily., Disp: 90 tablet, Rfl: 3 .  bisoprolol (ZEBETA) 5 MG tablet, TAKE 1 AND 1/2 TABLET BY MOUTH TWICE DAILY., Disp: 90 tablet, Rfl: 3 .  canagliflozin (INVOKANA) 300 MG TABS tablet, Take 300 mg by mouth daily before breakfast., Disp: , Rfl:  .  Carboxymethylcellul-Glycerin (LUBRICATING EYE DROPS OP), Place 1 drop into the right eye daily as needed (dry eyes)., Disp: , Rfl:  .  cloNIDine (CATAPRES) 0.1 MG tablet, Take 0.1 mg by mouth 2 (two) times daily., Disp: , Rfl:  .  diphenhydramine-acetaminophen (TYLENOL PM) 25-500 MG TABS tablet, Take 1 tablet by mouth at bedtime., Disp: , Rfl:  .  EPINEPHrine 0.3 mg/0.3 mL IJ SOAJ injection, Inject 0.3 mg into the muscle as needed for anaphylaxis. ,  Disp: , Rfl:  .  flecainide (TAMBOCOR) 150 MG tablet, Take 0.5 tablets (75 mg total) by mouth 2 (two) times daily., Disp: 90 tablet, Rfl: 3 .  glipiZIDE (GLUCOTROL XL) 5 MG 24 hr tablet, Take 5 mg by mouth daily before breakfast., Disp: , Rfl:  .  guaiFENesin (MUCINEX) 600 MG 12 hr tablet, Take 600 mg by mouth 2 (two) times daily. , Disp: , Rfl:  .  hydrocortisone cream 1 %, Apply 1 application topically daily as needed for itching., Disp: , Rfl:  .  levothyroxine (SYNTHROID, LEVOTHROID) 137 MCG tablet, Take 137 mcg by mouth daily before breakfast. , Disp: , Rfl:  .  losartan-hydrochlorothiazide (HYZAAR) 100-25 MG per tablet, Take 1 tablet by mouth daily., Disp: , Rfl:  .  metFORMIN (GLUCOPHAGE-XR) 500 MG 24 hr tablet, Take 1,000 mg by mouth at bedtime. , Disp: , Rfl:  .  montelukast (SINGULAIR) 10 MG tablet, TAKE (1) TABLET BY  MOUTH AT BEDTIME., Disp: 30 tablet, Rfl: 0 .  Multiple Vitamin (MULTIVITAMIN) tablet, Take 1 tablet by mouth daily.  , Disp: , Rfl:  .  omeprazole (PRILOSEC OTC) 20 MG tablet, Take 20 mg by mouth daily. , Disp: , Rfl:  .  Respiratory Therapy Supplies (FLUTTER) DEVI, USE AS DIRECTED, Disp: 1 each, Rfl: 0 .  rivaroxaban (XARELTO) 20 MG TABS tablet, Take 20 mg daily with supper by mouth., Disp: , Rfl:  .  voriconazole (VFEND) 200 MG tablet, Take 1 tablet (200 mg total) by mouth 2 (two) times daily., Disp: 180 tablet, Rfl: 0 .  WIXELA INHUB 500-50 MCG/DOSE AEPB, INHALE 1 PUFF INTO THE LUNGS TWICE DAILY. RINSE MOUTH AFTER USE., Disp: 180 each, Rfl: 0 .  XOLAIR 150 MG/ML prefilled syringe, INJECT 300MG SUBCUTANEOUSLY EVERY 4 WEEKS (GIVEN AT MD  OFFICE), Disp: 4 mL, Rfl: 5 .  albuterol (ACCUNEB) 0.63 MG/3ML nebulizer solution, Take 3 mLs by nebulization every 4 (four) hours as needed for shortness of breath., Disp: , Rfl:   Current Facility-Administered Medications:  .  omalizumab Arvid Right) injection 300 mg, 300 mg, Subcutaneous, Q14 Days, Sophiana Milanese, MD, 300 mg at 02/09/19 1519   Allergies:  Food, Iodinated diagnostic agents, Shellfish allergy, Goat-derived products, Prednisone, Metformin and related, and Voltaren [diclofenac sodium]  Review of Systems:  Constitutional: Feels well. Cardiovascular: Denies chest pain, exertional chest pain.  Pulmonary: Denies hemoptysis, pleuritic chest pain.   The remainder of systems were reviewed and were found to be negative other than what is documented in the HPI.    Physical Examination:   VS: BP 138/78 (BP Location: Left Arm, Cuff Size: Normal)   Pulse 86   Temp (!) 97.4 F (36.3 C) (Temporal)   Ht _0  (1.778 m)   Wt 233 lb 6.4 oz (105.9 kg)   SpO2 92%   BMI 33.49 kg/m   General Appearance: No distress  Neuro:without focal findings, mental status, speech normal, alert and oriented HEENT: PERRLA, EOM intact Pulmonary: No wheezing, No  rales  CardiovascularNormal S1,S2.  No m/r/g.  Abdomen: Benign, Soft, non-tender, No masses Renal:  No costovertebral tenderness  GU:  No performed at this time. Endoc: No evident thyromegaly, no signs of acromegaly or Cushing features Skin:   warm, no rashes, no ecchymosis  Extremities: normal, no cyanosis, clubbing.    LABORATORY PANEL:   CBC No results for input(s): WBC, HGB, HCT, PLT in the last 168 hours. ------------------------------------------------------------------------------------------------------------------  Chemistries  No results for input(s): NA, K, CL, CO2, GLUCOSE, BUN, CREATININE,  CALCIUM, MG, AST, ALT, ALKPHOS, BILITOT in the last 168 hours.  Invalid input(s): GFRCGP ------------------------------------------------------------------------------------------------------------------  Cardiac Enzymes No results for input(s): TROPONINI in the last 168 hours. ------------------------------------------------------------  RADIOLOGY:   No results found for this or any previous visit. Results for orders placed during the hospital encounter of 03/16/18  DG Chest 2 View   Narrative CLINICAL DATA:  Cough and congestion.  EXAM: CHEST - 2 VIEW  COMPARISON:  03/02/2018.  FINDINGS: Cardiac pacer with lead tips over the right atrium and right ventricle. Heart size normal. Low lung volumes. Mild basilar atelectasis. Interval near complete clearing of right base infiltrate. No pleural effusion or pneumothorax. Degenerative changes thoracic spine.  IMPRESSION: 1. Cardiac pacer with lead tips over the right atrium right ventricle.  2. Low lung volumes with mild basilar atelectasis. Interval clearing scratched it near complete interval clearing of left base infiltrate.   Electronically Signed   By: Marcello Moores  Register   On: 03/16/2018 11:48    ------------------------------------------------------------------------------------------------------------------   Thank  you for allowing [facility] to assist in the care of your patient. Our recommendations are noted above.  Please contact us if we can be of further service.   Marda Stalker, M.D., F.C.C.P.  Board Certified in Internal Medicine, Pulmonary Medicine, [place], and Sleep Medicine.  [facility] Office Number: [phone]   09/12/2019

## 2019-09-14 LAB — ASPERGILLUS ANTIGEN, BAL/SERUM: Aspergillus Ag, BAL/Serum: 0.04 Index (ref 0.00–0.49)

## 2019-09-18 ENCOUNTER — Other Ambulatory Visit: Payer: Self-pay

## 2019-09-18 ENCOUNTER — Inpatient Hospital Stay (HOSPITAL_COMMUNITY): Payer: Medicare Other

## 2019-09-18 ENCOUNTER — Encounter (HOSPITAL_COMMUNITY): Payer: Self-pay | Admitting: Emergency Medicine

## 2019-09-18 ENCOUNTER — Inpatient Hospital Stay (HOSPITAL_COMMUNITY)
Admission: EM | Admit: 2019-09-18 | Discharge: 2019-09-25 | DRG: 871 | Disposition: A | Discharge status: Home Health | Payer: Medicare Other | Attending: Internal Medicine | Admitting: Internal Medicine

## 2019-09-18 ENCOUNTER — Emergency Department (HOSPITAL_COMMUNITY): Payer: Medicare Other

## 2019-09-18 DIAGNOSIS — E119 Type 2 diabetes mellitus without complications: Secondary | ICD-10-CM

## 2019-09-18 DIAGNOSIS — Z947 Corneal transplant status: Secondary | ICD-10-CM

## 2019-09-18 DIAGNOSIS — Z800 Family history of malignant neoplasm of digestive organs: Secondary | ICD-10-CM

## 2019-09-18 DIAGNOSIS — I110 Hypertensive heart disease with heart failure: Secondary | ICD-10-CM | POA: Diagnosis present

## 2019-09-18 DIAGNOSIS — A419 Sepsis, unspecified organism: Principal | ICD-10-CM | POA: Diagnosis present

## 2019-09-18 DIAGNOSIS — I4819 Other persistent atrial fibrillation: Secondary | ICD-10-CM | POA: Diagnosis present

## 2019-09-18 DIAGNOSIS — J9601 Acute respiratory failure with hypoxia: Secondary | ICD-10-CM | POA: Diagnosis not present

## 2019-09-18 DIAGNOSIS — Z91018 Allergy to other foods: Secondary | ICD-10-CM

## 2019-09-18 DIAGNOSIS — J4550 Severe persistent asthma, uncomplicated: Secondary | ICD-10-CM | POA: Diagnosis present

## 2019-09-18 DIAGNOSIS — R7989 Other specified abnormal findings of blood chemistry: Secondary | ICD-10-CM | POA: Diagnosis not present

## 2019-09-18 DIAGNOSIS — E0590 Thyrotoxicosis, unspecified without thyrotoxic crisis or storm: Secondary | ICD-10-CM | POA: Diagnosis present

## 2019-09-18 DIAGNOSIS — E039 Hypothyroidism, unspecified: Secondary | ICD-10-CM | POA: Diagnosis present

## 2019-09-18 DIAGNOSIS — Z79890 Hormone replacement therapy (postmenopausal): Secondary | ICD-10-CM

## 2019-09-18 DIAGNOSIS — J410 Simple chronic bronchitis: Secondary | ICD-10-CM

## 2019-09-18 DIAGNOSIS — A409 Streptococcal sepsis, unspecified: Secondary | ICD-10-CM | POA: Diagnosis not present

## 2019-09-18 DIAGNOSIS — I5032 Chronic diastolic (congestive) heart failure: Secondary | ICD-10-CM | POA: Diagnosis present

## 2019-09-18 DIAGNOSIS — R040 Epistaxis: Secondary | ICD-10-CM | POA: Diagnosis not present

## 2019-09-18 DIAGNOSIS — J189 Pneumonia, unspecified organism: Secondary | ICD-10-CM

## 2019-09-18 DIAGNOSIS — J9621 Acute and chronic respiratory failure with hypoxia: Secondary | ICD-10-CM | POA: Diagnosis not present

## 2019-09-18 DIAGNOSIS — I495 Sick sinus syndrome: Secondary | ICD-10-CM | POA: Diagnosis present

## 2019-09-18 DIAGNOSIS — I483 Typical atrial flutter: Secondary | ICD-10-CM | POA: Diagnosis not present

## 2019-09-18 DIAGNOSIS — R4182 Altered mental status, unspecified: Secondary | ICD-10-CM | POA: Diagnosis present

## 2019-09-18 DIAGNOSIS — G9341 Metabolic encephalopathy: Secondary | ICD-10-CM | POA: Diagnosis present

## 2019-09-18 DIAGNOSIS — I480 Paroxysmal atrial fibrillation: Secondary | ICD-10-CM

## 2019-09-18 DIAGNOSIS — Z7984 Long term (current) use of oral hypoglycemic drugs: Secondary | ICD-10-CM

## 2019-09-18 DIAGNOSIS — R4781 Slurred speech: Secondary | ICD-10-CM | POA: Diagnosis present

## 2019-09-18 DIAGNOSIS — Z9049 Acquired absence of other specified parts of digestive tract: Secondary | ICD-10-CM

## 2019-09-18 DIAGNOSIS — Z20828 Contact with and (suspected) exposure to other viral communicable diseases: Secondary | ICD-10-CM | POA: Diagnosis present

## 2019-09-18 DIAGNOSIS — Z91041 Radiographic dye allergy status: Secondary | ICD-10-CM

## 2019-09-18 DIAGNOSIS — I6529 Occlusion and stenosis of unspecified carotid artery: Secondary | ICD-10-CM | POA: Diagnosis not present

## 2019-09-18 DIAGNOSIS — I4891 Unspecified atrial fibrillation: Secondary | ICD-10-CM

## 2019-09-18 DIAGNOSIS — I4510 Unspecified right bundle-branch block: Secondary | ICD-10-CM | POA: Diagnosis present

## 2019-09-18 DIAGNOSIS — B441 Other pulmonary aspergillosis: Secondary | ICD-10-CM | POA: Diagnosis present

## 2019-09-18 DIAGNOSIS — E785 Hyperlipidemia, unspecified: Secondary | ICD-10-CM | POA: Diagnosis present

## 2019-09-18 DIAGNOSIS — K219 Gastro-esophageal reflux disease without esophagitis: Secondary | ICD-10-CM | POA: Diagnosis not present

## 2019-09-18 DIAGNOSIS — Z7901 Long term (current) use of anticoagulants: Secondary | ICD-10-CM

## 2019-09-18 DIAGNOSIS — Z720 Tobacco use: Secondary | ICD-10-CM

## 2019-09-18 DIAGNOSIS — I4892 Unspecified atrial flutter: Secondary | ICD-10-CM | POA: Diagnosis present

## 2019-09-18 DIAGNOSIS — Z96651 Presence of right artificial knee joint: Secondary | ICD-10-CM | POA: Diagnosis present

## 2019-09-18 DIAGNOSIS — I493 Ventricular premature depolarization: Secondary | ICD-10-CM | POA: Diagnosis not present

## 2019-09-18 DIAGNOSIS — J9620 Acute and chronic respiratory failure, unspecified whether with hypoxia or hypercapnia: Secondary | ICD-10-CM | POA: Diagnosis present

## 2019-09-18 DIAGNOSIS — J440 Chronic obstructive pulmonary disease with acute lower respiratory infection: Secondary | ICD-10-CM | POA: Diagnosis present

## 2019-09-18 DIAGNOSIS — J181 Lobar pneumonia, unspecified organism: Secondary | ICD-10-CM

## 2019-09-18 DIAGNOSIS — Y95 Nosocomial condition: Secondary | ICD-10-CM | POA: Diagnosis present

## 2019-09-18 DIAGNOSIS — I10 Essential (primary) hypertension: Secondary | ICD-10-CM | POA: Diagnosis not present

## 2019-09-18 DIAGNOSIS — G4733 Obstructive sleep apnea (adult) (pediatric): Secondary | ICD-10-CM | POA: Diagnosis present

## 2019-09-18 DIAGNOSIS — R6520 Severe sepsis without septic shock: Secondary | ICD-10-CM | POA: Diagnosis present

## 2019-09-18 DIAGNOSIS — Z79899 Other long term (current) drug therapy: Secondary | ICD-10-CM

## 2019-09-18 DIAGNOSIS — Z7951 Long term (current) use of inhaled steroids: Secondary | ICD-10-CM

## 2019-09-18 DIAGNOSIS — Z85828 Personal history of other malignant neoplasm of skin: Secondary | ICD-10-CM

## 2019-09-18 DIAGNOSIS — Z91013 Allergy to seafood: Secondary | ICD-10-CM

## 2019-09-18 DIAGNOSIS — Z950 Presence of cardiac pacemaker: Secondary | ICD-10-CM | POA: Diagnosis present

## 2019-09-18 DIAGNOSIS — E782 Mixed hyperlipidemia: Secondary | ICD-10-CM | POA: Diagnosis present

## 2019-09-18 DIAGNOSIS — Z888 Allergy status to other drugs, medicaments and biological substances status: Secondary | ICD-10-CM

## 2019-09-18 DIAGNOSIS — J42 Unspecified chronic bronchitis: Secondary | ICD-10-CM | POA: Diagnosis present

## 2019-09-18 LAB — CBC WITH DIFFERENTIAL/PLATELET
Abs Immature Granulocytes: 0.03 10*3/uL (ref 0.00–0.07)
Basophils Absolute: 0 10*3/uL (ref 0.0–0.1)
Basophils Relative: 0 %
Eosinophils Absolute: 0.1 10*3/uL (ref 0.0–0.5)
Eosinophils Relative: 2 %
HCT: 56.3 % — ABNORMAL HIGH (ref 39.0–52.0)
Hemoglobin: 17.9 g/dL — ABNORMAL HIGH (ref 13.0–17.0)
Immature Granulocytes: 0 %
Lymphocytes Relative: 31 %
Lymphs Abs: 2.4 10*3/uL (ref 0.7–4.0)
MCH: 30.6 pg (ref 26.0–34.0)
MCHC: 31.8 g/dL (ref 30.0–36.0)
MCV: 96.2 fL (ref 80.0–100.0)
Monocytes Absolute: 0.7 10*3/uL (ref 0.1–1.0)
Monocytes Relative: 9 %
Neutro Abs: 4.3 10*3/uL (ref 1.7–7.7)
Neutrophils Relative %: 58 %
Platelets: 195 10*3/uL (ref 150–400)
RBC: 5.85 MIL/uL — ABNORMAL HIGH (ref 4.22–5.81)
RDW: 13.1 % (ref 11.5–15.5)
WBC: 7.6 10*3/uL (ref 4.0–10.5)
nRBC: 0 % (ref 0.0–0.2)

## 2019-09-18 LAB — COMPREHENSIVE METABOLIC PANEL
ALT: 31 U/L (ref 0–44)
AST: 30 U/L (ref 15–41)
Albumin: 4 g/dL (ref 3.5–5.0)
Alkaline Phosphatase: 102 U/L (ref 38–126)
Anion gap: 14 (ref 5–15)
BUN: 19 mg/dL (ref 8–23)
CO2: 26 mmol/L (ref 22–32)
Calcium: 9.1 mg/dL (ref 8.9–10.3)
Chloride: 99 mmol/L (ref 98–111)
Creatinine, Ser: 1.16 mg/dL (ref 0.61–1.24)
GFR calc Af Amer: >60 mL/min (ref >60)
GFR calc non Af Amer: >60 mL/min (ref >60)
Glucose, Bld: 134 mg/dL — ABNORMAL HIGH (ref 70–99)
Potassium: 4.3 mmol/L (ref 3.5–5.1)
Sodium: 139 mmol/L (ref 135–145)
Total Bilirubin: 1.2 mg/dL (ref 0.3–1.2)
Total Protein: 7.5 g/dL (ref 6.5–8.1)

## 2019-09-18 LAB — URINALYSIS, ROUTINE W REFLEX MICROSCOPIC
Bacteria, UA: NONE SEEN
Bilirubin Urine: NEGATIVE
Glucose, UA: >=500 mg/dL — AB
Hgb urine dipstick: NEGATIVE
Ketones, ur: 5 mg/dL — AB
Leukocytes,Ua: NEGATIVE
Nitrite: NEGATIVE
Protein, ur: 30 mg/dL — AB
Specific Gravity, Urine: 1.021 (ref 1.005–1.030)
pH: 6 (ref 5.0–8.0)

## 2019-09-18 LAB — APTT: aPTT: 36 seconds (ref 24–36)

## 2019-09-18 LAB — SARS CORONAVIRUS 2 BY RT PCR (HOSPITAL ORDER, PERFORMED IN ~~LOC~~ HOSPITAL LAB): SARS Coronavirus 2: NEGATIVE

## 2019-09-18 LAB — GLUCOSE, CAPILLARY
Glucose-Capillary: 162 mg/dL — ABNORMAL HIGH (ref 70–99)
Glucose-Capillary: 171 mg/dL — ABNORMAL HIGH (ref 70–99)
Glucose-Capillary: 122 mg/dL — ABNORMAL HIGH (ref 70–99)
Glucose-Capillary: 127 mg/dL — ABNORMAL HIGH (ref 70–99)

## 2019-09-18 LAB — LACTIC ACID, PLASMA
Lactic Acid, Venous: 4.2 mmol/L (ref 0.5–1.9)
Lactic Acid, Venous: 1.4 mmol/L (ref 0.5–1.9)

## 2019-09-18 LAB — PROTIME-INR
INR: 1.5 — ABNORMAL HIGH (ref 0.8–1.2)
Prothrombin Time: 17.9 seconds — ABNORMAL HIGH (ref 11.4–15.2)

## 2019-09-18 MED ORDER — MOMETASONE FURO-FORMOTEROL FUM 200-5 MCG/ACT IN AERO
2.0000 | INHALATION_SPRAY | Freq: Two times a day (BID) | RESPIRATORY_TRACT | Status: DC
  Administered 2019-09-19 – 2019-09-25 (×13): 2 via RESPIRATORY_TRACT
  Filled 2019-09-18: qty 8.8

## 2019-09-18 MED ORDER — ORAL CARE MOUTH RINSE
15.0000 mL | Freq: Two times a day (BID) | OROMUCOSAL | Status: DC
  Administered 2019-09-19 – 2019-09-25 (×9): 15 mL via OROMUCOSAL

## 2019-09-18 MED ORDER — METHYLPREDNISOLONE SODIUM SUCC 40 MG IJ SOLR
40.0000 mg | Freq: Three times a day (TID) | INTRAMUSCULAR | Status: DC
  Administered 2019-09-18: 40 mg via INTRAVENOUS
  Filled 2019-09-18: qty 1

## 2019-09-18 MED ORDER — VANCOMYCIN HCL 1.25 G IV SOLR
1250.0000 mg | INTRAVENOUS | Status: DC
  Filled 2019-09-18: qty 1250

## 2019-09-18 MED ORDER — CLONIDINE HCL 0.1 MG PO TABS
0.1000 mg | ORAL_TABLET | Freq: Two times a day (BID) | ORAL | Status: DC
  Administered 2019-09-18 – 2019-09-21 (×6): 0.1 mg via ORAL
  Filled 2019-09-18 (×7): qty 1

## 2019-09-18 MED ORDER — ONDANSETRON HCL 4 MG PO TABS: 4.0000 mg | ORAL_TABLET | Freq: Four times a day (QID) | ORAL | Status: DC | PRN

## 2019-09-18 MED ORDER — LORAZEPAM 2 MG/ML IJ SOLN
1.0000 mg | INTRAMUSCULAR | Status: DC | PRN
  Administered 2019-09-18: 14:00:00 1 mg via INTRAVENOUS

## 2019-09-18 MED ORDER — ACETAMINOPHEN 650 MG RE SUPP
650.0000 mg | Freq: Four times a day (QID) | RECTAL | Status: DC
  Administered 2019-09-18: 650 mg via RECTAL
  Filled 2019-09-18 (×2): qty 1

## 2019-09-18 MED ORDER — ACETAMINOPHEN 325 MG PO TABS
650.0000 mg | ORAL_TABLET | Freq: Four times a day (QID) | ORAL | Status: DC
  Administered 2019-09-18 – 2019-09-21 (×13): 650 mg via ORAL
  Filled 2019-09-18 (×12): qty 2

## 2019-09-18 MED ORDER — HALOPERIDOL LACTATE 5 MG/ML IJ SOLN
2.0000 mg | Freq: Four times a day (QID) | INTRAMUSCULAR | Status: DC | PRN
  Filled 2019-09-18: qty 1

## 2019-09-18 MED ORDER — FLECAINIDE ACETATE 50 MG PO TABS
75.0000 mg | ORAL_TABLET | Freq: Two times a day (BID) | ORAL | Status: DC
  Administered 2019-09-18 – 2019-09-25 (×14): 75 mg via ORAL
  Filled 2019-09-18 (×17): qty 2

## 2019-09-18 MED ORDER — VANCOMYCIN HCL 10 G IV SOLR
1250.0000 mg | INTRAVENOUS | Status: DC
  Filled 2019-09-18: qty 1250

## 2019-09-18 MED ORDER — VANCOMYCIN HCL IN DEXTROSE 1-5 GM/200ML-% IV SOLN
1000.0000 mg | Freq: Once | INTRAVENOUS | Status: AC
  Administered 2019-09-18: 09:00:00 1000 mg via INTRAVENOUS
  Filled 2019-09-18: qty 200

## 2019-09-18 MED ORDER — BISOPROLOL FUMARATE 5 MG PO TABS
7.5000 mg | ORAL_TABLET | Freq: Two times a day (BID) | ORAL | Status: DC
  Administered 2019-09-18 – 2019-09-25 (×14): 7.5 mg via ORAL
  Filled 2019-09-18 (×15): qty 2

## 2019-09-18 MED ORDER — LEVOTHYROXINE SODIUM 25 MCG PO TABS
137.0000 ug | ORAL_TABLET | Freq: Every day | ORAL | Status: DC
  Administered 2019-09-19 – 2019-09-25 (×7): 137 ug via ORAL
  Filled 2019-09-18 (×7): qty 1

## 2019-09-18 MED ORDER — MONTELUKAST SODIUM 10 MG PO TABS
10.0000 mg | ORAL_TABLET | Freq: Every day | ORAL | Status: DC
  Administered 2019-09-18 – 2019-09-24 (×7): 10 mg via ORAL
  Filled 2019-09-18 (×7): qty 1

## 2019-09-18 MED ORDER — RIVAROXABAN 20 MG PO TABS
20.0000 mg | ORAL_TABLET | Freq: Every day | ORAL | Status: DC
  Administered 2019-09-19 – 2019-09-24 (×6): 20 mg via ORAL
  Filled 2019-09-18 (×7): qty 1

## 2019-09-18 MED ORDER — ONDANSETRON HCL 4 MG/2ML IJ SOLN: 4.0000 mg | Freq: Four times a day (QID) | INTRAMUSCULAR | Status: DC | PRN

## 2019-09-18 MED ORDER — METOPROLOL TARTRATE 5 MG/5ML IV SOLN
2.5000 mg | Freq: Four times a day (QID) | INTRAVENOUS | Status: DC
  Administered 2019-09-18 – 2019-09-19 (×2): 2.5 mg via INTRAVENOUS
  Filled 2019-09-18 (×3): qty 5

## 2019-09-18 MED ORDER — ACETAMINOPHEN 650 MG RE SUPP
650.0000 mg | Freq: Once | RECTAL | Status: AC
  Administered 2019-09-18: 650 mg via RECTAL
  Filled 2019-09-18: qty 1

## 2019-09-18 MED ORDER — INSULIN ASPART 100 UNIT/ML ~~LOC~~ SOLN
0.0000 [IU] | Freq: Every day | SUBCUTANEOUS | Status: DC
  Administered 2019-09-19: 23:00:00 3 [IU] via SUBCUTANEOUS
  Administered 2019-09-24: 2 [IU] via SUBCUTANEOUS

## 2019-09-18 MED ORDER — BISACODYL 5 MG PO TBEC: 5.0000 mg | DELAYED_RELEASE_TABLET | Freq: Every day | ORAL | Status: DC | PRN

## 2019-09-18 MED ORDER — IPRATROPIUM-ALBUTEROL 0.5-2.5 (3) MG/3ML IN SOLN
3.0000 mL | RESPIRATORY_TRACT | Status: DC
  Administered 2019-09-18 – 2019-09-23 (×31): 3 mL via RESPIRATORY_TRACT
  Filled 2019-09-18 (×31): qty 3

## 2019-09-18 MED ORDER — INSULIN ASPART 100 UNIT/ML ~~LOC~~ SOLN
0.0000 [IU] | Freq: Three times a day (TID) | SUBCUTANEOUS | Status: DC
  Administered 2019-09-18: 14:00:00 4 [IU] via SUBCUTANEOUS
  Administered 2019-09-18: 18:00:00 3 [IU] via SUBCUTANEOUS
  Administered 2019-09-19 (×2): 7 [IU] via SUBCUTANEOUS
  Administered 2019-09-19: 08:00:00 3 [IU] via SUBCUTANEOUS
  Administered 2019-09-20: 08:00:00 4 [IU] via SUBCUTANEOUS
  Administered 2019-09-20: 7 [IU] via SUBCUTANEOUS
  Administered 2019-09-20 – 2019-09-21 (×2): 4 [IU] via SUBCUTANEOUS
  Administered 2019-09-21: 7 [IU] via SUBCUTANEOUS
  Administered 2019-09-22 (×2): 4 [IU] via SUBCUTANEOUS
  Administered 2019-09-23: 3 [IU] via SUBCUTANEOUS
  Administered 2019-09-23: 12:00:00 4 [IU] via SUBCUTANEOUS
  Administered 2019-09-24: 6 [IU] via SUBCUTANEOUS
  Administered 2019-09-24 – 2019-09-25 (×2): 4 [IU] via SUBCUTANEOUS
  Administered 2019-09-25: 7 [IU] via SUBCUTANEOUS

## 2019-09-18 MED ORDER — LORAZEPAM 2 MG/ML IJ SOLN
INTRAMUSCULAR | Status: AC
  Administered 2019-09-18: 1 mg via INTRAVENOUS
  Filled 2019-09-18: qty 1

## 2019-09-18 MED ORDER — SODIUM CHLORIDE 0.9 % IV SOLN
INTRAVENOUS | Status: DC
  Administered 2019-09-18 – 2019-09-20 (×4): via INTRAVENOUS

## 2019-09-18 MED ORDER — SODIUM CHLORIDE 0.9 % IV BOLUS
3000.0000 mL | Freq: Once | INTRAVENOUS | Status: AC
  Administered 2019-09-18: 3000 mL via INTRAVENOUS

## 2019-09-18 MED ORDER — METOPROLOL TARTRATE 5 MG/5ML IV SOLN: 5.0000 mg | Freq: Four times a day (QID) | INTRAVENOUS | Status: DC

## 2019-09-18 MED ORDER — PIPERACILLIN-TAZOBACTAM 3.375 G IVPB
3.3750 g | Freq: Three times a day (TID) | INTRAVENOUS | Status: DC
  Administered 2019-09-18 – 2019-09-22 (×12): 3.375 g via INTRAVENOUS
  Filled 2019-09-18 (×12): qty 50

## 2019-09-18 MED ORDER — METHYLPREDNISOLONE SODIUM SUCC 125 MG IJ SOLR
125.0000 mg | Freq: Once | INTRAMUSCULAR | Status: AC
  Administered 2019-09-18: 09:00:00 125 mg via INTRAVENOUS
  Filled 2019-09-18: qty 2

## 2019-09-18 MED ORDER — INSULIN ASPART 100 UNIT/ML ~~LOC~~ SOLN
5.0000 [IU] | Freq: Three times a day (TID) | SUBCUTANEOUS | Status: DC
  Administered 2019-09-19 – 2019-09-25 (×19): 5 [IU] via SUBCUTANEOUS

## 2019-09-18 MED ORDER — GUAIFENESIN ER 600 MG PO TB12
1200.0000 mg | ORAL_TABLET | Freq: Two times a day (BID) | ORAL | Status: DC
  Administered 2019-09-18 – 2019-09-25 (×14): 1200 mg via ORAL
  Filled 2019-09-18 (×16): qty 2

## 2019-09-18 MED ORDER — PIPERACILLIN-TAZOBACTAM 3.375 G IVPB 30 MIN
3.3750 g | Freq: Once | INTRAVENOUS | Status: AC
  Administered 2019-09-18: 3.375 g via INTRAVENOUS
  Filled 2019-09-18: qty 50

## 2019-09-18 MED ORDER — CHLORHEXIDINE GLUCONATE CLOTH 2 % EX PADS
6.0000 | MEDICATED_PAD | Freq: Every day | CUTANEOUS | Status: DC
  Administered 2019-09-18 – 2019-09-25 (×8): 6 via TOPICAL

## 2019-09-18 MED ORDER — METHYLPREDNISOLONE SODIUM SUCC 40 MG IJ SOLR
20.0000 mg | Freq: Three times a day (TID) | INTRAMUSCULAR | Status: DC
  Administered 2019-09-18: 20 mg via INTRAVENOUS
  Filled 2019-09-18 (×2): qty 1

## 2019-09-18 MED ORDER — VANCOMYCIN HCL IN DEXTROSE 1-5 GM/200ML-% IV SOLN
1000.0000 mg | Freq: Once | INTRAVENOUS | Status: AC
  Administered 2019-09-18: 11:00:00 1000 mg via INTRAVENOUS
  Filled 2019-09-18: qty 200

## 2019-09-18 MED ORDER — PANTOPRAZOLE SODIUM 40 MG PO TBEC
40.0000 mg | DELAYED_RELEASE_TABLET | Freq: Two times a day (BID) | ORAL | Status: DC
  Administered 2019-09-19 – 2019-09-25 (×13): 40 mg via ORAL
  Filled 2019-09-18 (×14): qty 1

## 2019-09-18 MED ORDER — VORICONAZOLE 50 MG PO TABS
200.0000 mg | ORAL_TABLET | Freq: Two times a day (BID) | ORAL | Status: DC
  Administered 2019-09-18 – 2019-09-25 (×13): 200 mg via ORAL
  Filled 2019-09-18 (×18): qty 4

## 2019-09-18 MED ORDER — TRAZODONE HCL 50 MG PO TABS
50.0000 mg | ORAL_TABLET | Freq: Every evening | ORAL | Status: DC | PRN
  Administered 2019-09-24: 50 mg via ORAL
  Filled 2019-09-18: qty 1

## 2019-09-18 NOTE — Progress Notes (Signed)
elink monitoring complete at 1359

## 2019-09-18 NOTE — Progress Notes (Signed)
Dr. Wynetta Emery notified of patient's oxygen saturation 78, cyanotic around ears, restless.  Oxygen increased temporarily to improve oxygenation. Dr. Wynetta Emery in to evaluate patient and update wife.

## 2019-09-18 NOTE — ED Notes (Signed)
Pt continues to be agitated, pulling at ivs, monitor, and oxygen. Attempting to hit staff. Is very confused and has incomprehensible speech.

## 2019-09-18 NOTE — Progress Notes (Signed)
Patient received from ED.  Patient was able to calm himself enough and try to cooperate so that restraints were not needed at this time.

## 2019-09-18 NOTE — ED Notes (Signed)
Date and time results received: 09/18/19 8:52 AM  (use smartphrase ".now" to insert current time)  Test: LACTIC Critical Value: 4.2  Name of Provider Notified: ZAMMIT  Orders Received? Or Actions Taken?: Orders Received - See Orders for details

## 2019-09-18 NOTE — Progress Notes (Signed)
Pharmacy Antibiotic Note  Clarence Dawson is a 73 y.o. male admitted on 09/18/2019 with sepsis.  Pharmacy has been consulted for Vancomycin and zosyn dosing.  Plan: Vancomycin 2000mg  loading dose, then 1250mg  IV every 24 hours.  Goal trough 15-20 mcg/mL.  Zosyn 3.375gm IV q8h EID over 4 hours F/U cxs and clinical progress Monitor V/S, labs and levels as indicated   Height: 5\' 8"  (172.7 cm) Weight: 239 lb 6.4 oz (108.6 kg) IBW/kg (Calculated) : 68.4  Temp (24hrs), Avg:100.4 F (38 C), Min:99.5 F (37.5 C), Max:101.8 F (38.8 C)  Recent Labs  Lab 09/12/19 0929 09/18/19 0747 09/18/19 1007  WBC 5.4 7.6  --   CREATININE 1.07 1.16  --   LATICACIDVEN  --  4.2* 1.4    Estimated Creatinine Clearance: 67.8 mL/min (by C-G formula based on SCr of 1.16 mg/dL).    Allergies  Allergen Reactions  . Food Anaphylaxis and Shortness Of Breath    TREE NUTS  . Iodinated Diagnostic Agents Hives and Shortness Of Breath    Patient states hives to throat closing. (01/15/17: patient states this reaction was "about 20 years ago" with possibly an IVP.  He now says high doses of prednisone "throw me into AFib."  He has tolerated CT arthrograms with Benadrly 50mg  PO one hour before injection.  Brita Romp, RN)  . Shellfish Allergy Anaphylaxis and Shortness Of Breath    To shellfish, crabs.  Makes him feel like "things are crawling all over" me.  Denies airway issues with these foods.  Brita Romp, RN 01/15/17)  . Goat-Derived Child psychotherapist   . Prednisone Palpitations    PRECIPITATES A-FIB  . Metformin And Related Diarrhea    High doses at once  . Voltaren [Diclofenac Sodium] Other (See Comments)    Feels like things are crawling on him    Antimicrobials this admission: Vancomycin 9/20>>  Zosyn 9/20 >>   Microbiology results: 9/20 BCx: pending 9/20 UCx: pending   MRSA PCR:   Thank you for allowing pharmacy to be a part of this patient's care.  Isac Sarna, BS Pharm D,  BCPS Clinical Pharmacist Pager [phone] 09/18/2019 12:12 PM

## 2019-09-18 NOTE — H&P (Addendum)
History and Physical  Clarence Dawson [id number] DOB: August 02, 1946 DOA: 09/18/2019  Referring physician: Roderic Palau MD  PCP: [name], MD   Chief Complaint: confusion, fever   HPI: Clarence Dawson is a 73 y.o. male arrived by EMS from home with high fever and confusion.  He has a history of severe persistent asthma and severe chronic intractable cough followed by pulmonology at [facility].  He is being treated for aspergillosis.  He has chronic bronchitis and COPD.  He also has a lung mass that is being followed by his pulmonologist.  He has chronic atrial fibrillation.  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  He has untreated sleep apnea due to CPAP intolerance.  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  He has type 2 diabetes mellitus.  According to wife he had been in his previous state of health last night and woke up this morning around 4 AM with confusion combativeness and altered speech patterns.  EMS noted his CBG was 160.  His fever was greater than 101.  On arrival to ED he was noted to have an elevated lactic acid of 4.2.  His temperature was 101.8.  His respiratory rate was 26, blood pressure 129/89 and pulse ox 85% on room air.  He was placed on 4 L nasal cannula with improvement in oxygen to 93%.  SARS 2 coronavirus test was negative.  Chest x-ray with right mid to lower lung haziness consistent with pneumonia.  Blood cultures x2 obtained and patient was given IV fluid bolus and started on IV antibiotics and admission was requested.  Review of Systems: Unable to obtain due to condition.  Past Medical History:  Diagnosis Date  . Allergic rhinitis   . Aortic valve disorder   . Asthma    since childhood- seasonal allergies induced  . Cancer ([place])    Skin cancer- squamous, basal  . Carotid artery stenosis   . Essential hypertension   . Full dentures   . GERD (gastroesophageal reflux disease)   . H/O hiatal hernia   . Hemorrhage of rectum   .  Hyperlipidemia   . Hypothyroidism   . 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 OSA (obstructive sleep apnea)   . Osteoarthritis   . Pacemaker    Oct 2005 in [place].  Marland Kitchen PAF (paroxysmal atrial fibrillation) ([place])   . Pneumonia    "several Times" 2015 last time  . Primary localized osteoarthritis of right knee 08/11/2017  . RBBB (right bundle branch block)   . Sinoatrial node dysfunction (HCC)   . Syncope   . Tricuspid valve disorder   . Type 2 diabetes mellitus ([place])    Type II   Past Surgical History:  Procedure Laterality Date  . A-V CARDIAC PACEMAKER INSERTION     Sick sinus syndrome DDR pacer  . Arthropathy Right 2005   Rebuilding of left thumb and joint   . CARDIAC CATHETERIZATION    . CARDIAC ELECTROPHYSIOLOGY STUDY AND ABLATION  09/2008   for pvcs, Dr. Loralie Champagne  . CARDIOVERSION N/A 12/18/2016   Procedure: CARDIOVERSION;  Surgeon: Pixie Casino, MD;  Location: [facility] ENDOSCOPY;  Service: Cardiovascular;  Laterality: N/A;  . [place]   right wrist  . CARPAL TUNNEL RELEASE  05/04/2012   Procedure: CARPAL TUNNEL RELEASE;  Surgeon: Wynonia Sours, MD;  Location: [place];  Service: Orthopedics;  Laterality: Left;  . CARPOMETACARPEL SUSPENSION PLASTY Right 11/16/2014  Procedure: SUSPENSIONPLASTY RIGHT THUMB TENDON TRANSFER ABDUCTOR POLLICUS LONGUS EXCISION TRAPEZIUM;  Surgeon: Daryll Brod, MD;  Location: [place];  Service: Orthopedics;  Laterality: Right;  . CHOLECYSTECTOMY  1994  . CIRCUMCISION    . COLONOSCOPY N/A 03/14/2013   Procedure: COLONOSCOPY;  Surgeon: Daneil Dolin, MD;  [facility]O [facility];  Service: Endoscopy;  Laterality: N/A;  8:15 AM  . 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 EYE SURGERY  2012   Left eye Corneal transplant- partial- Cataract  . FLEXIBLE BRONCHOSCOPY Right 06/23/2019   Procedure: FLEXIBLE BRONCHOSCOPY RIGHT;  Surgeon: [name], MD;  Location: [facility] ORS;  Service: Pulmonary;   Laterality: Right;  . GALLBLADDER SURGERY  12/01/2006  . HAND TENDON SURGERY Left late 1990's   thumb  . HEMORROIDECTOMY  2003  . INJECTION KNEE Left 08/26/2017   Procedure: LEFT KNEE INJECTION;  Surgeon: Elsie Saas, MD;  Location: [place];  Service: Orthopedics;  Laterality: Left;  . left Knee Arthroscopy     [facility]  . PARTIAL KNEE ARTHROPLASTY  11/22/2012   Procedure: UNICOMPARTMENTAL KNEE;  Surgeon: Lorn Junes, MD;  Location: [place];  Service: Orthopedics;  Laterality: Left;  left unicompartmental knee arthroplasty  . PERMANENT PACEMAKER GENERATOR CHANGE N/A 01/12/2013   Procedure: PERMANENT PACEMAKER GENERATOR CHANGE;  Surgeon: Evans Lance, MD;  Location: [facility] CATH LAB;  Service: Cardiovascular;  Laterality: N/A;  . Rotator cuff Surgery  2001   Right shoulder  . TONSILLECTOMY    . TOTAL KNEE ARTHROPLASTY Right 08/26/2017   Procedure: TOTAL KNEE ARTHROPLASTY;  Surgeon: Elsie Saas, MD;  Location: [place];  Service: Orthopedics;  Laterality: Right;  . varicose vein reduction     Social History:  reports that he quit smoking about 35 years ago. His smoking use included cigarettes. He started smoking about 61 years ago. He has a 25.00 pack-year smoking history. He has quit using smokeless tobacco.  His smokeless tobacco use included chew. He reports current alcohol use of about 4.0 standard drinks of alcohol per week. He reports that he does not use drugs.  Allergies  Allergen Reactions  . Food Anaphylaxis and Shortness Of Breath    TREE NUTS  . Iodinated Diagnostic Agents Hives and Shortness Of Breath    Patient states hives to throat closing. (01/15/17: patient states this reaction was "about 20 years ago" with possibly an IVP.  He now says high doses of prednisone "throw me into AFib."  He has tolerated CT arthrograms with Benadrly 17m PO one hour before injection.  JBrita Romp RN)  . Shellfish Allergy Anaphylaxis and Shortness Of Breath    To  shellfish, crabs.  Makes him feel like "things are crawling all over" me.  Denies airway issues with these foods.  (Brita Romp RN 01/15/17)  . Goat-Derived PChild psychotherapist  . Prednisone Palpitations    PRECIPITATES A-FIB  . Metformin And Related Diarrhea    High doses at once  . Voltaren [Diclofenac Sodium] Other (See Comments)    Feels like things are crawling on him    Family History  Problem Relation Age of Onset  . Other Father 622      Sudden Cardiac death  . Pancreatic cancer Mother   . Colon cancer Mother   . Colon cancer Maternal Aunt   . Colon polyps Neg Hx     Prior to Admission medications  Medication Sig Start Date End Date Taking? Authorizing Provider  acetaminophen (TYLENOL) 500 MG tablet Take 1,000 mg by mouth 3 (three) times daily.    [provider]  Aclidinium Bromide (TUDORZA PRESSAIR) 400 MCG/ACT AEPB 1 puff BID 08/08/19   [name], MD  albuterol (ACCUNEB) 0.63 MG/3ML nebulizer solution Take 3 mLs by nebulization every 4 (four) hours as needed for shortness of breath. 02/16/19   [provider]  albuterol (PROVENTIL HFA;VENTOLIN HFA) 108 (90 Base) MCG/ACT inhaler Inhale 2 puffs every 4 (four) hours as needed into the lungs for shortness of breath (only if you can't catch your breath).    [provider]  amLODipine (NORVASC) 5 MG tablet Take 1 tablet (5 mg total) by mouth daily. 12/08/18   Evans Lance, MD  bisoprolol (ZEBETA) 5 MG tablet TAKE 1 AND 1/2 TABLET BY MOUTH TWICE DAILY. 04/17/17   Sherran Needs, NP  canagliflozin (INVOKANA) 300 MG TABS tablet Take 300 mg by mouth daily before breakfast.    [provider]  Carboxymethylcellul-Glycerin (LUBRICATING EYE DROPS OP) Place 1 drop into the right eye daily as needed (dry eyes).    [provider]  cloNIDine (CATAPRES) 0.1 MG tablet Take 0.1 mg by mouth 2 (two) times daily.    [provider]  diphenhydramine-acetaminophen  (TYLENOL PM) 25-500 MG TABS tablet Take 1 tablet by mouth at bedtime.    [provider]  EPINEPHrine 0.3 mg/0.3 mL IJ SOAJ injection Inject 0.3 mg into the muscle as needed for anaphylaxis.  06/07/18   [provider]  flecainide (TAMBOCOR) 150 MG tablet Take 0.5 tablets (75 mg total) by mouth 2 (two) times daily. 12/08/18   Evans Lance, MD  glipiZIDE (GLUCOTROL XL) 5 MG 24 hr tablet Take 5 mg by mouth daily before breakfast.    [provider]  guaiFENesin (MUCINEX) 600 MG 12 hr tablet Take 600 mg by mouth 2 (two) times daily.     [provider]  hydrocortisone cream 1 % Apply 1 application topically daily as needed for itching.    [provider]  levothyroxine (SYNTHROID, LEVOTHROID) 137 MCG tablet Take 137 mcg by mouth daily before breakfast.  06/04/16   [provider]  losartan-hydrochlorothiazide (HYZAAR) 100-25 MG per tablet Take 1 tablet by mouth daily.    [provider]  metFORMIN (GLUCOPHAGE-XR) 500 MG 24 hr tablet Take 1,000 mg by mouth at bedtime.     [provider]  montelukast (SINGULAIR) 10 MG tablet TAKE (1) TABLET BY MOUTH AT BEDTIME. 10/25/18   [name], MD  Multiple Vitamin (MULTIVITAMIN) tablet Take 1 tablet by mouth daily.      [provider]  omeprazole (PRILOSEC OTC) 20 MG tablet Take 20 mg by mouth daily.     [provider]  Respiratory Therapy Supplies (FLUTTER) DEVI USE AS DIRECTED 02/09/19   Martyn Ehrich, NP  rivaroxaban (XARELTO) 20 MG TABS tablet Take 20 mg daily with supper by mouth.    [provider]  voriconazole (VFEND) 200 MG tablet Take 1 tablet (200 mg total) by mouth 2 (two) times daily. 08/08/19 11/06/19  [name], MD  WIXELA INHUB 500-50 MCG/DOSE AEPB INHALE 1 PUFF INTO THE LUNGS TWICE DAILY. RINSE MOUTH AFTER USE. 05/10/19   [name], MD  Arvid Right 150 MG/ML prefilled syringe INJECT 300MG SUBCUTANEOUSLY EVERY 4 WEEKS  (GIVEN AT MD  OFFICE) 05/16/19   [name], MD   Physical Exam: Vitals:  09/18/19 0911 09/18/19 0925 09/18/19 0930 09/18/19 0941  BP:   122/64   Pulse:  80 80 78  Resp: _0 Temp: 100.2 F (37.9 C) (!) 100.4 F (38 C) (!) 100.4 F (38 C) 100.2 F (37.9 C)  TempSrc:      SpO2:  91% 91% 93%  Weight:      Height:        General exam: Patient is very confused.  Moderately built and nourished patient, lying comfortably supine on the gurney in no obvious distress.  Head, eyes and ENT: Nontraumatic and normocephalic. Pupils equally reacting to light and accommodation. Oral mucosa dry.  Neck: Supple. No JVD, carotid bruit or thyromegaly.  Lymphatics: No lymphadenopathy.  Respiratory system: Diminished breath sounds and Rales in the right lower lobe. No increased work of breathing.  Cardiovascular system: S1 and S2 heard, irregularly irregular. No JVD, murmurs, gallops, clicks or pedal edema.  Gastrointestinal system: Abdomen is nondistended, soft and nontender. Normal bowel sounds heard. No organomegaly or masses appreciated.  Central nervous system: Alert and oriented. No focal neurological deficits.  Extremities: Symmetric 5 x 5 power. Peripheral pulses symmetrically felt.   Skin: No rashes or acute findings.  Musculoskeletal system: Negative exam.  Psychiatry: Confused and combative at times.  Labs on Admission:  Basic Metabolic Panel: Recent Labs  Lab 09/12/19 0929 09/18/19 0747  NA 138 139  K 3.9 4.3  CL 101 99  CO2 27 26  GLUCOSE 198* 134*  BUN 23 19  CREATININE 1.07 1.16  CALCIUM 9.0 9.1   Liver Function Tests: Recent Labs  Lab 09/12/19 0929 09/18/19 0747  AST 35 30  ALT 39 31  ALKPHOS 78 102  BILITOT 1.0 1.2  PROT 7.1 7.5  ALBUMIN 3.8 4.0   No results for input(s): LIPASE, AMYLASE in the last 168 hours. No results for input(s): AMMONIA in the last 168 hours. CBC: Recent Labs  Lab 09/12/19 0929 09/18/19 0747  WBC 5.4 7.6   NEUTROABS 3.3 4.3  HGB 17.1* 17.9*  HCT 51.9 56.3*  MCV 92.2 96.2  PLT 192 195   Cardiac Enzymes: No results for input(s): CKTOTAL, CKMB, CKMBINDEX, TROPONINI in the last 168 hours.  BNP (last 3 results) No results for input(s): PROBNP in the last 8760 hours. CBG: No results for input(s): GLUCAP in the last 168 hours.  Radiological Exams on Admission: Dg Chest Portable 1 View  Result Date: 09/18/2019 CLINICAL DATA:  Code sepsis.  Restless.  Fever. EXAM: PORTABLE CHEST 1 VIEW COMPARISON:  07/07/2019 FINDINGS: Study mildly degraded by motion. Cardiac silhouette normal in size. No mediastinal or hilar masses. Mild hazy opacity is noted in the right mid to lower lung, which is accentuated by respiratory motion. Mild opacity at the left lung base consistent with atelectasis or scarring. Lungs otherwise clear. No convincing effusion. No pneumothorax. Left anterior chest wall sequential pacemaker is stable. IMPRESSION: 1. Hazy opacity in the right mid to lower lung. This is consistent with pneumonia proper clinical setting. No other evidence of acute cardiopulmonary disease. Electronically Signed   By: Lajean Manes M.D.   On: 09/18/2019 08:55   Assessment/Plan Principal Problem:   Acute on chronic respiratory failure (HCC) Active Problems:   Severe sepsis (HCC)   Elevated lactic acid level   Essential hypertension, benign   Premature ventricular contractions   PPM-St.Jude   PVC's (premature ventricular contractions)   Sinoatrial node dysfunction (HCC)   Persistent atrial fibrillation   Hyperthyroidism  Hyperlipidemia   RBBB (right bundle branch block)   Carotid artery stenosis   Type 2 diabetes mellitus (HCC)   OSA (obstructive sleep apnea)   Severe persistent asthma   CAP (community acquired pneumonia)   Chronic bronchitis (HCC)   GERD (gastroesophageal reflux disease)   1. Acute on chronic respiratory failure secondary to pneumonia- admit to stepdown ICU for close monitoring  continue IV antibiotics and supportive therapies.  Follow chest x-ray.  Treating sepsis. 2. Severe sepsis-secondary to pneumonia-follow lactate.  Continue aggressive fluid hydration.  Continue IV antibiotics and oxygen. 3. Severe persistent asthma and COPD- IV steroids ordered continue bronchodilators as ordered.  Follow closely in stepdown ICU.  Will ask pulmonary to see in a.m. 4. Sinoatrial node dysfunction-status post pacemaker placement-continue to monitor. 5. Fever-secondary to pneumonia and sepsis, Tylenol scheduled. 6. Type 2 diabetes mellitus- SSI coverage ordered and prandial coverage.  Continue to monitor. 7. OSA-patient apparently does not tolerate CPAP. 8. GERD-Protonix ordered for GI protection. 9. Hypertension- blood pressure medications ordered.  Follow. 10. Chronic atrial fibrillation-resume home rate controlling medication and rivaroxaban for full anticoagulation. 11. Confusion/Agitation - Query from sepsis, fever, will obtain CT head to check for CVA.   DVT Prophylaxis: Rivaroxaban Code Status: Full Family Communication: Updated at bedside Disposition Plan: Inpatient stepdown ICU  Critical care time spent: 50 minutes   Wynetta Emery, MD [facility] How to contact the [facility] Attending or Consulting provider [place] or covering provider during after hours [place], for this patient?  1. Check the care team in [facility] and look for a) attending/consulting [facility] provider listed and b) the [facility] team listed 2. Log into [email or web address] and use [place]'s universal password to access. If you do not have the password, please contact the hospital operator. 3. Locate the [facility] provider you are looking for under [facility] and page to a number that you can be directly reached. 4. If you still have difficulty reaching the provider, please page the [facility] (Director on Call) for the Hospitalists listed on amion for assistance.

## 2019-09-18 NOTE — Progress Notes (Signed)
Pt calm, cooperative, and resting. Ankle and wrist restraints removed from PT. PT is not in need of restraints at this time. Continue to monitor.

## 2019-09-18 NOTE — ED Triage Notes (Addendum)
Pt brought in by ems after wife called this morning. Pt was normal when he went to bed last night (unknown time) and woke up at 4am and urinated in the hallway. Pt has been confused and combative since then speaking jibberish. cbg 160 by ems.

## 2019-09-18 NOTE — ED Provider Notes (Signed)
[facility] EMERGENCY DEPARTMENT Provider Note   CSN: [id number] Arrival date & time: 09/18/19  0076     History   Chief Complaint Chief Complaint  Patient presents with  . Fever  . Altered Mental Status    HPI Clarence Dawson is a 73 y.o. male.     Patient presents with fever and confusion and cough  The history is provided by the EMS personnel. No language interpreter was used.  Fever Max temp prior to arrival:  101.8 Temp source:  Rectal Severity:  Moderate Onset quality:  Sudden Timing:  Constant Progression:  Worsening Chronicity:  New Relieved by:  Nothing Worsened by:  Nothing Associated symptoms: cough   Associated symptoms: no chest pain   Altered Mental Status Associated symptoms: fever     Past Medical History:  Diagnosis Date  . Allergic rhinitis   . Aortic valve disorder   . Asthma    since childhood- seasonal allergies induced  . Cancer ([place])    Skin cancer- squamous, basal  . Carotid artery stenosis   . Essential hypertension   . Full dentures   . GERD (gastroesophageal reflux disease)   . H/O hiatal hernia   . Hemorrhage of rectum   . Hyperlipidemia   . Hypothyroidism   . 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 OSA (obstructive sleep apnea)   . Osteoarthritis   . Pacemaker    Oct 2005 in [place].  Marland Kitchen PAF (paroxysmal atrial fibrillation) ([place])   . Pneumonia    "several Times" 2015 last time  . Primary localized osteoarthritis of right knee 08/11/2017  . RBBB (right bundle branch block)   . Sinoatrial node dysfunction (HCC)   . Syncope   . Tricuspid valve disorder   . Type 2 diabetes mellitus ([place])    Type II    Patient Active Problem List   Diagnosis Date Noted  . Acute on chronic respiratory failure ([place]) 09/18/2019  . Chronic bronchitis ([place]) 02/09/2019  . CAP (community acquired pneumonia) 03/03/2018  . Primary localized osteoarthritis of right knee 08/11/2017  . Asthma with acute exacerbation 12/08/2016  . Chest pain 10/20/2016  . HTN  (hypertension) 10/20/2016  . Morbid obesity due to excess calories ([place]) 06/24/2016  . Severe persistent asthma 02/12/2016  . Upper airway cough syndrome 01/24/2016  . 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 Essential hypertension   . Persistent atrial fibrillation   . Syncope   . Hyperthyroidism   . Hyperlipidemia   . RBBB (right bundle branch block)   . Tricuspid valve disorder   . Aortic valve disorder   . Carotid artery stenosis   . Diabetes ([place])   . Pacemaker   . OSA (obstructive sleep apnea)   . Arthritis   . Sinoatrial node dysfunction (HCC)   . PVC's (premature ventricular contractions) 07/17/2011  . Essential hypertension, benign 01/24/2011  . Premature ventricular contractions 01/24/2011  . PPM-[place] 01/24/2011    Past Surgical History:  Procedure Laterality Date  . A-V CARDIAC PACEMAKER INSERTION     Sick sinus syndrome DDR pacer  . Arthropathy Right 2005   Rebuilding of left thumb and joint   . CARDIAC CATHETERIZATION    . CARDIAC ELECTROPHYSIOLOGY STUDY AND ABLATION  09/2008   for pvcs, Dr. Loralie Champagne  . CARDIOVERSION N/A 12/18/2016   Procedure: CARDIOVERSION;  Surgeon: Pixie Casino, MD;  Location: [facility] ENDOSCOPY;  Service: Cardiovascular;  Laterality: N/A;  . CARPAL TUNNEL RELEASE  1994   right wrist  . CARPAL TUNNEL RELEASE  05/04/2012   Procedure: CARPAL TUNNEL RELEASE;  Surgeon: Wynonia Sours, MD;  Location: [place];  Service: Orthopedics;  Laterality: Left;  . CARPOMETACARPEL SUSPENSION PLASTY Right 11/16/2014   Procedure: SUSPENSIONPLASTY RIGHT THUMB TENDON TRANSFER ABDUCTOR POLLICUS LONGUS EXCISION TRAPEZIUM;  Surgeon: Daryll Brod, MD;  Location: [place];  Service: Orthopedics;  Laterality: Right;  . CHOLECYSTECTOMY  1994  . CIRCUMCISION    . COLONOSCOPY N/A 03/14/2013   Procedure: COLONOSCOPY;  Surgeon: Daneil Dolin, MD;  [facility];  Service: Endoscopy;  Laterality: N/A;  8:15 AM  . EYE SURGERY     corneal  transplant [facility]16/2011[facility]  . EYE SURGERY  2012   Left eye Corneal transplant- partial- Cataract  . FLEXIBLE BRONCHOSCOPY Right 06/23/2019   Procedure: FLEXIBLE BRONCHOSCOPY RIGHT;  Surgeon: [name], MD;  Location: [facility] ORS;  Service: Pulmonary;  Laterality: Right;  . GALLBLADDER SURGERY  12/01/2006  . HAND TENDON SURGERY Left late 1990's   thumb  . HEMORROIDECTOMY  2003  . INJECTION KNEE Left 08/26/2017   Procedure: LEFT KNEE INJECTION;  Surgeon: Elsie Saas, MD;  Location: [place];  Service: Orthopedics;  Laterality: Left;  . left Knee Arthroscopy     [facility]  . PARTIAL KNEE ARTHROPLASTY  11/22/2012   Procedure: UNICOMPARTMENTAL KNEE;  Surgeon: Lorn Junes, MD;  Location: [place];  Service: Orthopedics;  Laterality: Left;  left unicompartmental knee arthroplasty  . PERMANENT PACEMAKER GENERATOR CHANGE N/A 01/12/2013   Procedure: PERMANENT PACEMAKER GENERATOR CHANGE;  Surgeon: Evans Lance, MD;  Location: [facility] CATH LAB;  Service: Cardiovascular;  Laterality: N/A;  . Rotator cuff Surgery  2001   Right shoulder  . TONSILLECTOMY    . TOTAL KNEE ARTHROPLASTY Right 08/26/2017   Procedure: TOTAL KNEE ARTHROPLASTY;  Surgeon: Elsie Saas, MD;  Location: [place];  Service: Orthopedics;  Laterality: Right;  . varicose vein reduction          Home Medications    Prior to Admission medications   Medication Sig Start Date End Date Taking? Authorizing Provider  acetaminophen (TYLENOL) 500 MG tablet Take 1,000 mg by mouth 3 (three) times daily.    [provider]  Aclidinium Bromide (TUDORZA PRESSAIR) 400 MCG/ACT AEPB 1 puff BID 08/08/19   [name], MD  albuterol (ACCUNEB) 0.63 MG/3ML nebulizer solution Take 3 mLs by nebulization every 4 (four) hours as needed for shortness of breath. 02/16/19   [provider]  albuterol (PROVENTIL HFA;VENTOLIN HFA) 108 (90 Base) MCG/ACT inhaler Inhale 2 puffs every 4 (four) hours as  needed into the lungs for shortness of breath (only if you can't catch your breath).    [provider]  amLODipine (NORVASC) 5 MG tablet Take 1 tablet (5 mg total) by mouth daily. 12/08/18   Evans Lance, MD  bisoprolol (ZEBETA) 5 MG tablet TAKE 1 AND 1/2 TABLET BY MOUTH TWICE DAILY. 04/17/17   Sherran Needs, NP  canagliflozin (INVOKANA) 300 MG TABS tablet Take 300 mg by mouth daily before breakfast.    [provider]  Carboxymethylcellul-Glycerin (LUBRICATING EYE DROPS OP) Place 1 drop into the right eye daily as needed (dry eyes).    [provider]  cloNIDine (CATAPRES) 0.1 MG tablet Take 0.1 mg by mouth 2 (two) times daily.    [provider]  diphenhydramine-acetaminophen (TYLENOL PM) 25-500 MG TABS tablet Take 1 tablet by mouth at bedtime.    [provider]  EPINEPHrine 0.3 mg/0.3 mL IJ SOAJ injection Inject 0.3 mg into the muscle as needed for anaphylaxis.  06/07/18   [provider]  flecainide (TAMBOCOR) 150 MG tablet Take 0.5 tablets (75 mg total) by mouth 2 (two) times daily. 12/08/18   Evans Lance, MD  glipiZIDE (GLUCOTROL XL) 5 MG 24 hr tablet Take 5 mg by mouth daily before breakfast.    [provider]  guaiFENesin (MUCINEX) 600 MG 12 hr tablet Take 600 mg by mouth 2 (two) times daily.     [provider]  hydrocortisone cream 1 % Apply 1 application topically daily as needed for itching.    [provider]  levothyroxine (SYNTHROID, LEVOTHROID) 137 MCG tablet Take 137 mcg by mouth daily before breakfast.  06/04/16   [provider]  losartan-hydrochlorothiazide (HYZAAR) 100-25 MG per tablet Take 1 tablet by mouth daily.    [provider]  metFORMIN (GLUCOPHAGE-XR) 500 MG 24 hr tablet Take 1,000 mg by mouth at bedtime.     [provider]  montelukast (SINGULAIR) 10 MG tablet TAKE (1) TABLET BY MOUTH AT BEDTIME. 10/25/18   [name], MD  Multiple Vitamin  (MULTIVITAMIN) tablet Take 1 tablet by mouth daily.      [provider]  omeprazole (PRILOSEC OTC) 20 MG tablet Take 20 mg by mouth daily.     [provider]  Respiratory Therapy Supplies (FLUTTER) DEVI USE AS DIRECTED 02/09/19   Martyn Ehrich, NP  rivaroxaban (XARELTO) 20 MG TABS tablet Take 20 mg daily with supper by mouth.    [provider]  voriconazole (VFEND) 200 MG tablet Take 1 tablet (200 mg total) by mouth 2 (two) times daily. 08/08/19 11/06/19  [name], MD  WIXELA INHUB 500-50 MCG/DOSE AEPB INHALE 1 PUFF INTO THE LUNGS TWICE DAILY. RINSE MOUTH AFTER USE. 05/10/19   [name], MD  Arvid Right 150 MG/ML prefilled syringe INJECT 300MG SUBCUTANEOUSLY EVERY 4 WEEKS (GIVEN AT MD  OFFICE) 05/16/19   [name], MD    Family History Family History  Problem Relation Age of Onset  . Other Father 61       Sudden Cardiac death  . Pancreatic cancer Mother   . Colon cancer Mother   . Colon cancer Maternal Aunt   . Colon polyps Neg Hx     Social History Social History   Tobacco Use  . Smoking status: Former Smoker    Packs/day: 1.00    Years: 25.00    Pack years: 25.00    Types: Cigarettes    Start date: 02/20/1958    Quit date: 02/12/1984    Years since quitting: 35.6  . Smokeless tobacco: Former Systems developer    Types: Chew  Substance Use Topics  . Alcohol use: Yes    Alcohol/week: 4.0 standard drinks    Types: 4 Glasses of wine per week  . Drug use: No     Allergies   Food, Iodinated diagnostic agents, Shellfish allergy, Goat-derived products, Prednisone, Metformin and related, and Voltaren [diclofenac sodium]   Review of Systems Review of Systems  Unable to perform ROS: Mental status change  Constitutional: Positive for fever.  Respiratory: Positive for cough.   Cardiovascular: Negative for chest pain.     Physical Exam Updated Vital Signs BP (!) 158/123   Pulse 80   Temp (!) 100.4 F (38 C)   Resp 20    Ht _0  (1.727 m)   Wt 108.6 kg   SpO2 91%  BMI 36.40 kg/m   Physical Exam Vitals signs and nursing note reviewed.  Constitutional:      Appearance: He is well-developed.  HENT:     Head: Normocephalic.     Nose: Nose normal.  Eyes:     General: No scleral icterus.    Conjunctiva/sclera: Conjunctivae normal.  Neck:     Musculoskeletal: Neck supple.     Thyroid: No thyromegaly.  Cardiovascular:     Rate and Rhythm: Regular rhythm.     Heart sounds: No murmur. No friction rub. No gallop.      Comments: Tachycardia Pulmonary:     Breath sounds: No stridor. No wheezing or rales.  Chest:     Chest wall: No tenderness.  Abdominal:     General: There is no distension.     Tenderness: There is no abdominal tenderness. There is no rebound.  Musculoskeletal: Normal range of motion.  Lymphadenopathy:     Cervical: No cervical adenopathy.  Skin:    Findings: No erythema or rash.  Neurological:     Motor: No abnormal muscle tone.     Coordination: Coordination normal.     Comments: Patient alert and oriented to person only  Psychiatric:     Comments: Patient agitated and confused      ED Treatments / Results  Labs (all labs ordered are listed, but only abnormal results are displayed) Labs Reviewed  COMPREHENSIVE METABOLIC PANEL - Abnormal; Notable for the following components:      Result Value   Glucose, Bld 134 (*)    All other components within normal limits  LACTIC ACID, PLASMA - Abnormal; Notable for the following components:   Lactic Acid, Venous 4.2 (*)    All other components within normal limits  CBC WITH DIFFERENTIAL/PLATELET - Abnormal; Notable for the following components:   RBC 5.85 (*)    Hemoglobin 17.9 (*)    HCT 56.3 (*)    All other components within normal limits  PROTIME-INR - Abnormal; Notable for the following components:   Prothrombin Time 17.9 (*)    INR 1.5 (*)    All other components within normal limits  URINALYSIS, ROUTINE W REFLEX  MICROSCOPIC - Abnormal; Notable for the following components:   Glucose, UA >=500 (*)    Ketones, ur 5 (*)    Protein, ur 30 (*)    All other components within normal limits  SARS CORONAVIRUS 2 (HOSPITAL ORDER, [place] [facility])  CULTURE, BLOOD (ROUTINE X 2)  CULTURE, BLOOD (ROUTINE X 2)  URINE CULTURE  APTT  LACTIC ACID, PLASMA    EKG None  Radiology Dg Chest Portable 1 View  Result Date: 09/18/2019 CLINICAL DATA:  Code sepsis.  Restless.  Fever. EXAM: PORTABLE CHEST 1 VIEW COMPARISON:  07/07/2019 FINDINGS: Study mildly degraded by motion. Cardiac silhouette normal in size. No mediastinal or hilar masses. Mild hazy opacity is noted in the right mid to lower lung, which is accentuated by respiratory motion. Mild opacity at the left lung base consistent with atelectasis or scarring. Lungs otherwise clear. No convincing effusion. No pneumothorax. Left anterior chest wall sequential pacemaker is stable. IMPRESSION: 1. Hazy opacity in the right mid to lower lung. This is consistent with pneumonia proper clinical setting. No other evidence of acute cardiopulmonary disease. Electronically Signed   By: Lajean Manes M.D.   On: 09/18/2019 08:55    Procedures Procedures (including critical care time)  Medications Ordered in ED Medications  vancomycin (VANCOCIN) IVPB 1000 mg/200  mL premix (1,000 mg Intravenous New Bag/Given 09/18/19 0842)  vancomycin (VANCOCIN) IVPB 1000 mg/200 mL premix (has no administration in time range)  sodium chloride 0.9 % bolus 3,000 mL (3,000 mLs Intravenous New Bag/Given 09/18/19 0831)  piperacillin-tazobactam (ZOSYN) IVPB 3.375 g (3.375 g Intravenous New Bag/Given 09/18/19 0835)  methylPREDNISolone sodium succinate (SOLU-MEDROL) 125 mg/2 mL injection 125 mg (125 mg Intravenous Given 09/18/19 0842)  acetaminophen (TYLENOL) suppository 650 mg (650 mg Rectal Given 09/18/19 0930)     Initial Impression / Assessment and Plan / ED Course  I have  reviewed the triage vital signs and the nursing notes.  Pertinent labs & imaging results that were available during my care of the patient were reviewed by me and considered in my medical decision making (see chart for details).        CRITICAL CARE Performed by: Milton Ferguson Total critical care time:45 minutes Critical care time was exclusive of separately billable procedures and treating other patients. Critical care was necessary to treat or prevent imminent or life-threatening deterioration. Critical care was time spent personally by me on the following activities: development of treatment plan with patient and/or surrogate as well as nursing, discussions with consultants, evaluation of patient's response to treatment, examination of patient, obtaining history from patient or surrogate, ordering and performing treatments and interventions, ordering and review of laboratory studies, ordering and review of radiographic studies, pulse oximetry and re-evaluation of patient's condition.   Final Clinical Impressions(s) / ED Diagnoses   Final diagnoses:  Healthcare-associated pneumonia   Patient with sepsis and pneumonia.  He is empirically started on Zosyn and Vanco and will be admitted to the intensive care unit under the medical service ED Discharge Orders    None       Milton Ferguson, MD 09/18/19 [phone]

## 2019-09-19 ENCOUNTER — Inpatient Hospital Stay (HOSPITAL_COMMUNITY): Payer: Medicare Other

## 2019-09-19 ENCOUNTER — Telehealth: Payer: Self-pay | Admitting: Internal Medicine

## 2019-09-19 DIAGNOSIS — I4510 Unspecified right bundle-branch block: Secondary | ICD-10-CM

## 2019-09-19 LAB — GLUCOSE, CAPILLARY
Glucose-Capillary: 181 mg/dL — ABNORMAL HIGH (ref 70–99)
Glucose-Capillary: 140 mg/dL — ABNORMAL HIGH (ref 70–99)
Glucose-Capillary: 219 mg/dL — ABNORMAL HIGH (ref 70–99)
Glucose-Capillary: 240 mg/dL — ABNORMAL HIGH (ref 70–99)
Glucose-Capillary: 268 mg/dL — ABNORMAL HIGH (ref 70–99)

## 2019-09-19 LAB — HEMOGLOBIN A1C
Hgb A1c MFr Bld: 7.2 % — ABNORMAL HIGH (ref 4.8–5.6)
Mean Plasma Glucose: 159.94 mg/dL

## 2019-09-19 LAB — COMPREHENSIVE METABOLIC PANEL
ALT: 23 U/L (ref 0–44)
AST: 19 U/L (ref 15–41)
Albumin: 2.9 g/dL — ABNORMAL LOW (ref 3.5–5.0)
Alkaline Phosphatase: 72 U/L (ref 38–126)
Anion gap: 10 (ref 5–15)
BUN: 27 mg/dL — ABNORMAL HIGH (ref 8–23)
CO2: 20 mmol/L — ABNORMAL LOW (ref 22–32)
Calcium: 8 mg/dL — ABNORMAL LOW (ref 8.9–10.3)
Chloride: 108 mmol/L (ref 98–111)
Creatinine, Ser: 0.84 mg/dL (ref 0.61–1.24)
GFR calc Af Amer: >60 mL/min (ref >60)
GFR calc non Af Amer: >60 mL/min (ref >60)
Glucose, Bld: 150 mg/dL — ABNORMAL HIGH (ref 70–99)
Potassium: 4.2 mmol/L (ref 3.5–5.1)
Sodium: 138 mmol/L (ref 135–145)
Total Bilirubin: 1 mg/dL (ref 0.3–1.2)
Total Protein: 6.1 g/dL — ABNORMAL LOW (ref 6.5–8.1)

## 2019-09-19 LAB — CBC WITH DIFFERENTIAL/PLATELET
Abs Immature Granulocytes: 0.02 10*3/uL (ref 0.00–0.07)
Basophils Absolute: 0 10*3/uL (ref 0.0–0.1)
Basophils Relative: 0 %
Eosinophils Absolute: 0 10*3/uL (ref 0.0–0.5)
Eosinophils Relative: 0 %
HCT: 49.8 % (ref 39.0–52.0)
Hemoglobin: 16.2 g/dL (ref 13.0–17.0)
Immature Granulocytes: 0 %
Lymphocytes Relative: 12 %
Lymphs Abs: 0.9 10*3/uL (ref 0.7–4.0)
MCH: 31 pg (ref 26.0–34.0)
MCHC: 32.5 g/dL (ref 30.0–36.0)
MCV: 95.2 fL (ref 80.0–100.0)
Monocytes Absolute: 0.2 10*3/uL (ref 0.1–1.0)
Monocytes Relative: 3 %
Neutro Abs: 6.9 10*3/uL (ref 1.7–7.7)
Neutrophils Relative %: 85 %
Platelets: 179 10*3/uL (ref 150–400)
RBC: 5.23 MIL/uL (ref 4.22–5.81)
RDW: 12.7 % (ref 11.5–15.5)
WBC: 8 10*3/uL (ref 4.0–10.5)
nRBC: 0 % (ref 0.0–0.2)

## 2019-09-19 LAB — URINE CULTURE: Culture: NO GROWTH

## 2019-09-19 LAB — MAGNESIUM: Magnesium: 2.2 mg/dL (ref 1.7–2.4)

## 2019-09-19 LAB — MRSA PCR SCREENING: MRSA by PCR: NEGATIVE

## 2019-09-19 MED ORDER — ADULT MULTIVITAMIN W/MINERALS CH
1.0000 | ORAL_TABLET | Freq: Every day | ORAL | Status: DC
  Administered 2019-09-19 – 2019-09-25 (×7): 1 via ORAL
  Filled 2019-09-19 (×7): qty 1

## 2019-09-19 NOTE — Evaluation (Signed)
Clinical/Bedside Swallow Evaluation Patient Details  Name: Clarence Dawson MRN: [id number] Date of Birth: 1946/06/19  Today's Date: 09/19/2019 Time: SLP Start Time (ACUTE ONLY): 1034 SLP Stop Time (ACUTE ONLY): 1102 SLP Time Calculation (min) (ACUTE ONLY): 28 min  Past Medical History:  Past Medical History:  Diagnosis Date  . Allergic rhinitis   . Aortic valve disorder   . Asthma    since childhood- seasonal allergies induced  . Cancer ([place])    Skin cancer- squamous, basal  . Carotid artery stenosis   . Essential hypertension   . Full dentures   . GERD (gastroesophageal reflux disease)   . H/O hiatal hernia   . Hemorrhage of rectum   . Hyperlipidemia   . Hypothyroidism   . 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 OSA (obstructive sleep apnea)   . Osteoarthritis   . Pacemaker    Oct 2005 in [place].  Marland Kitchen PAF (paroxysmal atrial fibrillation) ([place])   . Pneumonia    "several Times" 2015 last time  . Primary localized osteoarthritis of right knee 08/11/2017  . RBBB (right bundle branch block)   . Sinoatrial node dysfunction (HCC)   . Syncope   . Tricuspid valve disorder   . Type 2 diabetes mellitus ([place])    Type II   Past Surgical History:  Past Surgical History:  Procedure Laterality Date  . A-V CARDIAC PACEMAKER INSERTION     Sick sinus syndrome DDR pacer  . Arthropathy Right 2005   Rebuilding of left thumb and joint   . CARDIAC CATHETERIZATION    . CARDIAC ELECTROPHYSIOLOGY STUDY AND ABLATION  09/2008   for pvcs, Dr. Loralie Champagne  . CARDIOVERSION N/A 12/18/2016   Procedure: CARDIOVERSION;  Surgeon: Pixie Casino, MD;  Location[facility] ENDOSCOPY;  Service: Cardiovascular;  Laterality: N/A;  . [place]   right wrist  . CARPAL TUNNEL RELEASE  05/04/2012   Procedure: CARPAL TUNNEL RELEASE;  Surgeon: Wynonia Sours, MD;  Location: [place];  Service: Orthopedics;  Laterality: Left;  . CARPOMETACARPEL SUSPENSION PLASTY Right 11/16/2014   Procedure:  SUSPENSIONPLASTY RIGHT THUMB TENDON TRANSFER ABDUCTOR POLLICUS LONGUS EXCISION TRAPEZIUM;  Surgeon: Daryll Brod, MD;  Location: [place];  Service: Orthopedics;  Laterality: Right;  . CHOLECYSTECTOMY  1994  . CIRCUMCISION    . COLONOSCOPY N/A 03/14/2013   Procedure: COLONOSCOPY;  Surgeon: Daneil Dolin, MD;  [facility];  Service: Endoscopy;  Laterality: N/A;  8:15 AM  . EYE SURGERY     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-[facility]  . EYE SURGERY  2012   Left eye Corneal transplant- partial- Cataract  . FLEXIBLE BRONCHOSCOPY Right 06/23/2019   Procedure: FLEXIBLE BRONCHOSCOPY RIGHT;  Surgeon: [name], MD;  Location: [facility] ORS;  Service: Pulmonary;  Laterality: Right;  . GALLBLADDER SURGERY  12/01/2006  . HAND TENDON SURGERY Left late 1990's   thumb  . HEMORROIDECTOMY  2003  . INJECTION KNEE Left 08/26/2017   Procedure: LEFT KNEE INJECTION;  Surgeon: Elsie Saas, MD;  Location: [place];  Service: Orthopedics;  Laterality: Left;  . left Knee Arthroscopy     [facility]  . PARTIAL KNEE ARTHROPLASTY  11/22/2012   Procedure: UNICOMPARTMENTAL KNEE;  Surgeon: Lorn Junes, MD;  Location: [place];  Service: Orthopedics;  Laterality: Left;  left unicompartmental knee arthroplasty  . PERMANENT PACEMAKER GENERATOR CHANGE N/A 01/12/2013   Procedure: PERMANENT PACEMAKER GENERATOR CHANGE;  Surgeon: Evans Lance, MD;  Location: [facility]  CATH LAB;  Service: Cardiovascular;  Laterality: N/A;  . Rotator cuff Surgery  2001   Right shoulder  . TONSILLECTOMY    . TOTAL KNEE ARTHROPLASTY Right 08/26/2017   Procedure: TOTAL KNEE ARTHROPLASTY;  Surgeon: Elsie Saas, MD;  Location: [place];  Service: Orthopedics;  Laterality: Right;  . varicose vein reduction     HPI:  73 y/o male with severe asthma and COPD, aspergillosis and atrial fibrillation and s/p Pacemaker placement present with fever, confusion, and RUL/RLL pneumonia. BSE requested.   Assessment /  Plan / Recommendation Clinical Impression  Clinical swallow evaluation completed at bedside with spouse present in the room. Pt's spouse reports that Pt has always eaten his meals quickly. Oral motor examination is WNL. Pt wears U/L dentures and has since he was a teenager. He did not have his dentures when he was eating breakfast this AM when the doctor came by. Pt does not exhibit signs or symptoms of aspiration, no globus sensation with regular textures and thin liquids via self presentations. Pt was noted to talk while eating. SLP reviewed coordination of respiration and swallow reciprocity as Pt does have an element of COPD. He was encouraged to eat/drink while sitting upright, remain upright after meals (has a hiatal hernia), and to refrain from talking while eating/drinking. Pt and wife were appreciative of recommendations. Recommend regular textures and thin liquids with standard aspiration and reflux precautions. SLP will sign off. If MD would like objective assessment via MBSS for occult aspiration, please order and we can complete. Aspiration risk appears low at this time.  Above to RN.   SLP Visit Diagnosis: Dysphagia, unspecified (R13.10)     Mild aspiration risk    Diet Recommendation Regular;Thin liquid   Liquid Administration via: Cup;Straw Medication Administration: Whole meds with liquid Supervision: Patient able to self feed Compensations: Small sips/bites;Slow rate Postural Changes: Seated upright at 90 degrees;Remain upright for at least 30 minutes after po intake    Other  Recommendations Oral Care Recommendations: Oral care BID Other Recommendations: Clarify dietary restrictions   Follow up Recommendations None      Frequency and Duration            Prognosis Prognosis for Safe Diet Advancement: Good      Swallow Study   General Date of Onset: 09/18/19 HPI: 73 y/o male with severe asthma and COPD, aspergillosis and atrial fibrillation and s/p Pacemaker  placement present with fever, confusion, and RUL/RLL pneumonia. BSE requested. Type of Study: Bedside Swallow Evaluation Previous Swallow Assessment: None on record Diet Prior to this Study: Regular;Thin liquids Temperature Spikes Noted: No Respiratory Status: Nasal cannula History of Recent Intubation: No Behavior/Cognition: Alert;Cooperative;Pleasant mood Oral Cavity Assessment: Within Functional Limits Oral Care Completed by SLP: Recent completion by staff Oral Cavity - Dentition: Dentures, top;Dentures, bottom Vision: Functional for self-feeding Self-Feeding Abilities: Able to feed self Patient Positioning: Upright in bed Baseline Vocal Quality: Normal Volitional Cough: Strong Volitional Swallow: Able to elicit    Oral/Motor/Sensory Function Overall Oral Motor/Sensory Function: Within functional limits   Ice Chips Ice chips: Within functional limits Presentation: Spoon   Thin Liquid Thin Liquid: Within functional limits Presentation: Cup;Self Fed;Straw    Nectar Thick Nectar Thick Liquid: Not tested   Honey Thick Honey Thick Liquid: Not tested   Puree Puree: Within functional limits Presentation: Spoon   Solid     Solid: Within functional limits Presentation: Self Fed     Thank you,  Genene Churn, [place]  PORTER,DABNEY 09/19/2019,11:25 AM

## 2019-09-19 NOTE — Consult Note (Signed)
Consult requested by: [facility] hospitalist, Dr. 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  He woke up on the morning of admission with confusion combativeness and altered speech patterns.  He had temperature greater than 101 lactate was 4.2 and he was hypoxic.  Corona virus testing was negative.  Chest x-ray showed right middle lower lung haziness consistent with pneumonia.  He was felt to be septic on admission.  He is still confused now.  He is still coughing up sputum.  He has been combative but that has resolved but he is still confused.  Past Medical History:  Diagnosis Date  . Allergic rhinitis   . Aortic valve disorder   . Asthma    since childhood- seasonal allergies induced  . Cancer ([place])    Skin cancer- squamous, basal  . Carotid artery stenosis   . Essential hypertension   . Full dentures   . GERD (gastroesophageal reflux disease)   . H/O hiatal hernia   . Hemorrhage of rectum   . Hyperlipidemia   . Hypothyroidism   . 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 OSA (obstructive sleep apnea)   . Osteoarthritis   . Pacemaker    Oct 2005 in [place].  Marland Kitchen PAF (paroxysmal atrial fibrillation) ([place])   . Pneumonia    "several Times" 2015 last time  . Primary localized osteoarthritis of right knee 08/11/2017  . RBBB (right bundle branch block)   . Sinoatrial node dysfunction (HCC)   . Syncope   . Tricuspid valve disorder   . Type 2 diabetes mellitus (HCC)    Type II     Family History  Problem Relation Age of Onset  . Other Father 68       Sudden Cardiac death  . Pancreatic cancer Mother   . Colon cancer Mother   . Colon  cancer Maternal Aunt   . Colon polyps Neg Hx      Social History   Socioeconomic History  . Marital status: Married    Spouse name: Not on file  . Number of children: Not on file  . Years of education: Not on file  . Highest education level: Not on file  Occupational History  . Not on file  Social Needs  . Financial resource strain: Not on file  . Food insecurity    Worry: Not on file    Inability: Not on file  . Transportation needs    Medical: Not on file    Non-medical: Not on file  Tobacco Use  . Smoking status: Former Smoker    Packs/day: 1.00    Years: 25.00    Pack years: 25.00    Types: Cigarettes    Start date: 02/20/1958    Quit date: 02/12/1984    Years since quitting: 35.6  . Smokeless tobacco: Former Systems developer    Types: Chew  Substance and Sexual Activity  . Alcohol use: Yes    Alcohol/week: 4.0 standard drinks    Types: 4 Glasses of wine per week  . Drug use: No  . Sexual activity: Not on file  Lifestyle  . Physical activity    Days per week: Not on file    Minutes  per session: Not on file  . Stress: Not on file  Relationships  . Social Herbalist on phone: Not on file    Gets together: Not on file    Attends religious service: Not on file    Active member of club or organization: Not on file    Attends meetings of clubs or organizations: Not on file    Relationship status: Not on file  Other Topics Concern  . Not on file  Social History Narrative   Regular exercise: No     ROS: Unobtainable because of confusion    Objective: Vital signs in last 24 hours: Temp:  [97.5 F (36.4 C)-100.6 F (38.1 C)] 98.2 F (36.8 C) (09/21 0746) Pulse Rate:  [75-123] 77 (09/21 0746) Resp:  [12-22] 18 (09/21 0746) BP: (105-158)/(59-123) 132/70 (09/21 0730) SpO2:  [78 %-98 %] 94 % (09/21 0823) Weight:  [106.8 kg-108.9 kg] 106.8 kg (09/21 0500) Weight change:  Last BM Date: 09/18/19  Intake/Output from previous day: 09/20 0701 - 09/21 0700 In:  1754.6 [P.O.:240; I.V.:1190.3; IV Piggyback:324.3] Out: 2255 [Urine:2255]  PHYSICAL EXAM Constitutional: He is awake and alert.  He is still confused.  Eyes: Pupils react EOMI.  Ears nose mouth and throat: His mucous membranes are moist.  Neck is supple.  Hearing is grossly normal.  Cardiovascular: His heart is regular with normal heart sounds and looks like it is paced rhythm on the monitor.  Respiratory: He has bilateral rhonchi but no wheezing.  Gastrointestinal: He has normal bowel sounds his abdomen is soft without masses.  Musculoskeletal: Grossly normal strength bilaterally.  Neurological: No focal abnormalities but he is confused.  Psychiatric: Difficult to assess with his confusion  Lab Results: Basic Metabolic Panel: Recent Labs    09/18/19 0747 09/19/19 0455  NA 139 138  K 4.3 4.2  CL 99 108  CO2 26 20*  GLUCOSE 134* 150*  BUN 19 27*  CREATININE 1.16 0.84  CALCIUM 9.1 8.0*  MG  --  2.2   Liver Function Tests: Recent Labs    09/18/19 0747 09/19/19 0455  AST 30 19  ALT 31 23  ALKPHOS 102 72  BILITOT 1.2 1.0  PROT 7.5 6.1*  ALBUMIN 4.0 2.9*   No results for input(s): LIPASE, AMYLASE in the last 72 hours. No results for input(s): AMMONIA in the last 72 hours. CBC: Recent Labs    09/18/19 0747 09/19/19 0455  WBC 7.6 8.0  NEUTROABS 4.3 6.9  HGB 17.9* 16.2  HCT 56.3* 49.8  MCV 96.2 95.2  PLT 195 179   Cardiac Enzymes: No results for input(s): CKTOTAL, CKMB, CKMBINDEX, TROPONINI in the last 72 hours. BNP: No results for input(s): PROBNP in the last 72 hours. D-Dimer: No results for input(s): DDIMER in the last 72 hours. CBG: Recent Labs    09/18/19 1153 09/18/19 1340 09/18/19 1652 09/18/19 2109 09/19/19 0356 09/19/19 0745  GLUCAP 162* 171* 122* 127* 181* 140*   Hemoglobin A1C: No results for input(s): HGBA1C in the last 72 hours. Fasting Lipid Panel: No results for input(s): CHOL, HDL, LDLCALC, TRIG, CHOLHDL, LDLDIRECT in the last 72  hours. Thyroid Function Tests: No results for input(s): TSH, T4TOTAL, FREET4, T3FREE, THYROIDAB in the last 72 hours. Anemia Panel: No results for input(s): VITAMINB12, FOLATE, FERRITIN, TIBC, IRON, RETICCTPCT in the last 72 hours. Coagulation: Recent Labs    09/18/19 0747  LABPROT 17.9*  INR 1.5*   Urine Drug Screen: Drugs of Abuse  No results found  for: LABOPIA, COCAINSCRNUR, LABBENZ, AMPHETMU, THCU, LABBARB  Alcohol Level: No results for input(s): ETH in the last 72 hours. Urinalysis: Recent Labs    09/18/19 0747  COLORURINE YELLOW  LABSPEC 1.021  PHURINE 6.0  GLUCOSEU >=500*  HGBUR NEGATIVE  BILIRUBINUR NEGATIVE  KETONESUR 5*  PROTEINUR 30*  NITRITE NEGATIVE  LEUKOCYTESUR NEGATIVE   Misc. Labs:   ABGS: No results for input(s): PHART, PO2ART, TCO2, HCO3 in the last 72 hours.  Invalid input(s): PCO2   MICROBIOLOGY: Recent Results (from the past 240 hour(s))  Aspergillus Ag, BAL/Serum     Status: None   Collection Time: 09/12/19  9:29 AM  Result Value Ref Range Status   Aspergillus Ag, BAL/Serum 0.04 0.00 - 0.49 Index Final    Comment: (NOTE) Performed At: [facility] [place], [place] [id number] Rush Farmer MD IP:[id number] Performed At: [facility] [address] [place], [place] [id number] Katina Degree MDPhD WI:[id number]   Culture, blood (Routine x 2)     Status: None (Preliminary result)   Collection Time: 09/18/19  7:47 AM   Specimen: BLOOD  Result Value Ref Range Status   Specimen Description BLOOD  Final   Special Requests NONE  Final   Culture   Final    NO GROWTH < 24 HOURS Performed at [facility], [address] [place], [place] [address]    Report Status PENDING  Incomplete  Culture, blood (Routine x 2)     Status: None (Preliminary result)   Collection Time: 09/18/19  7:52 AM   Specimen: Right Antecubital; Blood  Result Value Ref Range Status   Specimen Description RIGHT ANTECUBITAL  Final   Special  Requests   Final    Blood Culture results may not be optimal due to an inadequate volume of blood received in culture bottles [place]   Culture   Final    NO GROWTH < 24 HOURS Performed at [facility], [address], [place], [place] [address]    Report Status PENDING  Incomplete  SARS Coronavirus 2 [facility] order, Performed in [place] hospital lab) Nasopharyngeal Nasopharyngeal Swab     Status: None   Collection Time: 09/18/19  8:19 AM   Specimen: Nasopharyngeal Swab  Result Value Ref Range Status   SARS Coronavirus 2 NEGATIVE NEGATIVE Final    Comment: (NOTE) If result is NEGATIVE SARS-CoV-2 target nucleic acids are NOT DETECTED. The SARS-CoV-2 RNA is generally detectable in upper and lower  respiratory specimens during the acute phase of infection. The lowest  concentration of SARS-CoV-2 viral copies this assay can detect is 250  copies / mL. A negative result does not preclude SARS-CoV-2 infection  and should not be used as the sole basis for treatment or other  patient management decisions.  A negative result may occur with  improper specimen collection / handling, submission of specimen other  than nasopharyngeal swab, presence of viral mutation(s) within the  areas targeted by this assay, and inadequate number of viral copies  (<250 copies / mL). A negative result must be combined with clinical  observations, patient history, and epidemiological information. If result is POSITIVE SARS-CoV-2 target nucleic acids are DETECTED. The SARS-CoV-2 RNA is generally detectable in upper and lower  respiratory specimens dur ing the acute phase of infection.  Positive  results are indicative of active infection with SARS-CoV-2.  Clinical  correlation with patient history and other diagnostic information is  necessary to determine patient infection status.  Positive  results do  not rule out bacterial infection or co-infection with other viruses. If result  is PRESUMPTIVE POSTIVE SARS-CoV-2 nucleic acids MAY BE PRESENT.   A presumptive positive result was obtained on the submitted specimen  and confirmed on repeat testing.  While 2019 novel coronavirus  (SARS-CoV-2) nucleic acids may be present in the submitted sample  additional confirmatory testing may be necessary for epidemiological  and / or clinical management purposes  to differentiate between  SARS-CoV-2 and other Sarbecovirus currently known to infect humans.  If clinically indicated additional testing with an alternate test  methodology [phone]) is advised. The SARS-CoV-2 RNA is generally  detectable in upper and lower respiratory sp ecimens during the acute  phase of infection. The expected result is Negative. Fact Sheet for Patients:  StrictlyIdeas.no Fact Sheet for Healthcare Providers: BankingDealers.co.za This test is not yet approved or cleared by the [place] FDA and has been authorized for detection and/or diagnosis of SARS-CoV-2 by FDA under an Emergency Use Authorization (EUA).  This EUA will remain in effect (meaning this test can be used) for the duration of the COVID-19 declaration under Section 564(b)(1) of the Act, 21 U.S.C. section 360bbb-3(b)(1), unless the authorization is terminated or revoked sooner. Performed at [facility], [address] [place], [place] [address]   MRSA PCR Screening     Status: None   Collection Time: 09/18/19 11:43 AM   Specimen: Nasal Mucosa; Nasopharyngeal  Result Value Ref Range Status   MRSA by PCR NEGATIVE NEGATIVE Final    Comment:        The GeneXpert MRSA Assay (FDA approved for NASAL specimens only), is one component of a comprehensive MRSA colonization surveillance program. It is not intended to diagnose MRSA infection nor to guide or monitor treatment for MRSA infections. Performed at [facility], [address], [place], [place] [address]      Studies/Results: Ct Head Wo Contrast  Result Date: 09/18/2019 CLINICAL DATA:  Confusion EXAM: CT HEAD WITHOUT CONTRAST TECHNIQUE: Contiguous axial images were obtained from the base of the skull through the vertex without intravenous contrast. COMPARISON:  None. FINDINGS: Brain: No evidence of acute infarction, hemorrhage, hydrocephalus, extra-axial collection or mass lesion/mass effect. Vascular: No hyperdense vessel or unexpected calcification. Skull: Normal. Negative for fracture or focal lesion. Sinuses/Orbits: No acute finding. Other: None. IMPRESSION: No acute intracranial pathology. Electronically Signed   By: Eddie Candle M.D.   On: 09/18/2019 14:53   Portable Chest 1 View  Result Date: 09/19/2019 CLINICAL DATA:  Fever, cough. EXAM: PORTABLE CHEST 1 VIEW COMPARISON:  Radiograph of September 18, 2019. FINDINGS: Stable cardiomediastinal silhouette. Left-sided pacemaker is unchanged in position. No pneumothorax or pleural effusion is noted. Mildly decreased right lung opacity is noted suggesting improving pneumonia. Mild left basilar atelectasis or infiltrate is noted. Bony thorax is unremarkable. IMPRESSION: Mildly decreased right lung opacity is noted suggesting improving pneumonia. Mild left basilar subsegmental atelectasis or infiltrate is noted. Electronically Signed   By: Marijo Conception M.D.   On: 09/19/2019 07:45   Dg Chest Portable 1 View  Result Date: 09/18/2019 CLINICAL DATA:  Code sepsis.  Restless.  Fever. EXAM: PORTABLE CHEST 1 VIEW COMPARISON:  07/07/2019 FINDINGS: Study mildly degraded by motion. Cardiac silhouette normal in size. No mediastinal or hilar masses. Mild hazy opacity is noted in the right mid to lower lung, which is accentuated by respiratory motion. Mild opacity at the left lung base consistent with atelectasis or scarring. Lungs otherwise clear. No convincing effusion. No  pneumothorax. Left anterior chest wall sequential pacemaker is stable. IMPRESSION: 1. Hazy  opacity in the right mid to lower lung. This is consistent with pneumonia proper clinical setting. No other evidence of acute cardiopulmonary disease. Electronically Signed   By: Lajean Manes M.D.   On: 09/18/2019 08:55    Medications:  Prior to Admission:  Facility-Administered Medications Prior to Admission  Medication Dose Route Frequency Provider Last Rate Last Dose  . omalizumab Arvid Right) injection 300 mg  300 mg Subcutaneous Q14 Days [name], MD   300 mg at 02/09/19 1519   Medications Prior to Admission  Medication Sig Dispense Refill Last Dose  . acetaminophen (TYLENOL) 500 MG tablet Take 1,000 mg by mouth 3 (three) times daily.   09/18/2019 at 0415  . Aclidinium Bromide (TUDORZA PRESSAIR) 400 MCG/ACT AEPB 1 puff BID 1 each 2 09/17/2019 at Unknown time  . albuterol (ACCUNEB) 0.63 MG/3ML nebulizer solution Take 3 mLs by nebulization every 4 (four) hours as needed for shortness of breath.     Marland Kitchen albuterol (PROVENTIL HFA;VENTOLIN HFA) 108 (90 Base) MCG/ACT inhaler Inhale 2 puffs every 4 (four) hours as needed into the lungs for shortness of breath (only if you can't catch your breath).     Marland Kitchen amLODipine (NORVASC) 5 MG tablet Take 1 tablet (5 mg total) by mouth daily. 90 tablet 3 09/17/2019 at Unknown time  . bisoprolol (ZEBETA) 5 MG tablet TAKE 1 AND 1/2 TABLET BY MOUTH TWICE DAILY. 90 tablet 3 09/17/2019 at Unknown time  . canagliflozin (INVOKANA) 300 MG TABS tablet Take 300 mg by mouth daily before breakfast.   09/17/2019 at Unknown time  . Carboxymethylcellul-Glycerin (LUBRICATING EYE DROPS OP) Place 1 drop into the right eye daily as needed (dry eyes).   09/17/2019 at Unknown time  . cloNIDine (CATAPRES) 0.1 MG tablet Take 0.1 mg by mouth 2 (two) times daily.   09/17/2019 at Unknown time  . diphenhydramine-acetaminophen (TYLENOL PM) 25-500 MG TABS tablet Take 1 tablet by mouth at bedtime.     Marland Kitchen EPINEPHrine 0.3 mg/0.3 mL IJ SOAJ injection Inject 0.3 mg into the muscle as needed for  anaphylaxis.    Past Month at Unknown time  . flecainide (TAMBOCOR) 150 MG tablet Take 0.5 tablets (75 mg total) by mouth 2 (two) times daily. 90 tablet 3 09/17/2019 at Unknown time  . glipiZIDE (GLUCOTROL XL) 5 MG 24 hr tablet Take 5 mg by mouth daily before breakfast.   09/17/2019 at Unknown time  . guaiFENesin (MUCINEX) 600 MG 12 hr tablet Take 600 mg by mouth 2 (two) times daily.    09/17/2019 at Unknown time  . hydrocortisone cream 1 % Apply 1 application topically daily as needed for itching.     . levothyroxine (SYNTHROID, LEVOTHROID) 137 MCG tablet Take 137 mcg by mouth daily before breakfast.    09/17/2019 at Unknown time  . losartan-hydrochlorothiazide (HYZAAR) 100-25 MG per tablet Take 1 tablet by mouth daily.   09/17/2019 at Unknown time  . metFORMIN (GLUCOPHAGE-XR) 500 MG 24 hr tablet Take 1,000 mg by mouth at bedtime.    09/17/2019 at Unknown time  . montelukast (SINGULAIR) 10 MG tablet TAKE (1) TABLET BY MOUTH AT BEDTIME. 30 tablet 0 09/17/2019 at Unknown time  . Multiple Vitamin (MULTIVITAMIN) tablet Take 1 tablet by mouth daily.     09/17/2019 at Unknown time  . omeprazole (PRILOSEC OTC) 20 MG tablet Take 20 mg by mouth daily.    09/17/2019 at Unknown time  . rivaroxaban (XARELTO)  20 MG TABS tablet Take 20 mg daily with supper by mouth.   09/17/2019 at Unknown time  . voriconazole (VFEND) 200 MG tablet Take 1 tablet (200 mg total) by mouth 2 (two) times daily. 180 tablet 0 09/17/2019 at Unknown time  . WIXELA INHUB 500-50 MCG/DOSE AEPB INHALE 1 PUFF INTO THE LUNGS TWICE DAILY. RINSE MOUTH AFTER USE. 180 each 0 09/17/2019 at Unknown time  . XOLAIR 150 MG/ML prefilled syringe INJECT 300MG SUBCUTANEOUSLY EVERY 4 WEEKS (GIVEN AT MD  OFFICE) 4 mL 5 Past Month at Unknown time  . Respiratory Therapy Supplies (FLUTTER) DEVI USE AS DIRECTED (Patient not taking: Reported on 09/18/2019) 1 each 0 Not Taking at Unknown time   Scheduled: . acetaminophen  650 mg Oral Q6H   Or  . acetaminophen  650 mg Rectal  Q6H  . bisoprolol  7.5 mg Oral BID  . Chlorhexidine Gluconate Cloth  6 each Topical Daily  . cloNIDine  0.1 mg Oral BID  . flecainide  75 mg Oral BID  . guaiFENesin  1,200 mg Oral BID  . insulin aspart  0-20 Units Subcutaneous TID WC  . insulin aspart  0-5 Units Subcutaneous QHS  . insulin aspart  5 Units Subcutaneous TID WC  . ipratropium-albuterol  3 mL Nebulization Q4H  . levothyroxine  137 mcg Oral QAC breakfast  . mouth rinse  15 mL Mouth Rinse q12n4p  . mometasone-formoterol  2 puff Inhalation BID  . montelukast  10 mg Oral QHS  . multivitamin with minerals  1 tablet Oral Daily  . pantoprazole  40 mg Oral BID AC  . rivaroxaban  20 mg Oral Q supper  . voriconazole  200 mg Oral BID   Continuous: . sodium chloride 50 mL/hr at 09/19/19 0757  . piperacillin-tazobactam (ZOSYN)  IV 3.375 g (09/19/19 0548)   URK:YHCWCBJSE, haloperidol lactate, LORazepam, ondansetron **OR** ondansetron (ZOFRAN) IV, traZODone  Assesment: He was admitted with acute on chronic hypoxic respiratory failure.  He is oxygenating okay and his O2 saturation is good on 4 L.  He is known to have COPD/chronic bronchitis at baseline and has severe persistent asthma.  He had severe sepsis and that seems to have resolved as far as sepsis pathophysiology.  He was septic presumably from community-acquired pneumonia and his chest x-ray actually looks better already today.  I have personally reviewed the films  He had what I think was toxic metabolic encephalopathy from his infection and he is better but still confused  He has sleep apnea intolerant of CPAP  He has persistent atrial fib and has a pacemaker and seems to be in paced rhythm now  He has history of pulmonary aspergillosis Principal Problem:   Acute on chronic respiratory failure ([place]) Active Problems:   Essential hypertension, benign   Premature ventricular contractions   PPM-St.Jude   PVC's (premature ventricular contractions)   Sinoatrial node  dysfunction (HCC)   Persistent atrial fibrillation   Hyperthyroidism   Hyperlipidemia   RBBB (right bundle branch block)   Carotid artery stenosis   Type 2 diabetes mellitus (HCC)   OSA (obstructive sleep apnea)   Severe persistent asthma   CAP (community acquired pneumonia)   Chronic bronchitis (HCC)   Severe sepsis (HCC)   Elevated lactic acid level   GERD (gastroesophageal reflux disease)    Plan: Continue treatments.  He seems to be improving.  Current antibiotic steroids inhaled bronchodilators seem to be working    LOS: 1 day   Alonza Bogus 09/19/2019,  8:35 AM

## 2019-09-19 NOTE — Telephone Encounter (Signed)
Xolair Prefilled Syringe Order: 150mg  Prefilled Syringe:  #2 75mg  Prefilled Syringe: N/A Ordered Date: 09/19/2019 Expected date of arrival: 09/22/2019 Ordered by: Desmond Dike, [place]  Specialty Pharmacy: [facility]

## 2019-09-19 NOTE — Progress Notes (Signed)
PROGRESS NOTE    Clarence Dawson  [id number]  DOB: August 09, 1946  DOA: 09/18/2019 PCP: [name], MD   Brief Admission Hx: 73 y/o male with severe asthma and COPD, aspergillosis and atrial fibrillation and s/p Pacemaker placement present with fever, confusion, and RUL/RLL pneumonia.   MDM/Assessment & Plan:   1. Acute on chronic respiratory failure secondary to pneumonia-he has been admitted to the stepdown ICU and is being treated with IV antibiotics.  DC steroids.  Continue antibiotics.  Continue supportive therapy.  Obtain SLP evaluation for possible aspiration. 2. Severe sepsis as lactate is trending down to normal.  He was treated with IV antibiotics fluids and supportive therapy.  Clinically is improving. 3. Severe persistent asthma and COPD- he was started on IV steroids and bronchodilators and he is improving.  I asked pulmonary to see him today given his history of aspergillosis and severe persistent asthma and COPD. 4. 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  We have resumed all his rate control medications. 5. Chronic atrial fibrillation- continue rivaroxaban for full anticoagulation and resumed all his rate control medications. 6. GERD-Protonix ordered for GI protection. 7. Type 2 diabetes mellitus-SSI coverage and prandial coverage ordered" continue to monitor closely. 8. Fever-continue Tylenol as needed for symptoms. 9. OSA- patient apparently does not tolerate CPAP 10. Hypertension-blood pressure medicines ordered and will follow closely. 11. Agitation and delirium-this is improving with treatments.  CT head negative for acute findings.  DVT prophylaxis: Rivaroxaban Code Status: Full Family Communication: Wife at bedside Disposition Plan: Continue stepdown ICU care   Consultants:  pulmonology  Procedures:    Antimicrobials:  Vancomycin  Zosyn   Subjective: Pt more alert and less combattive today, he says he is coughing all  the time.   Objective: Vitals:   09/19/19 0730 09/19/19 0746 09/19/19 0818 09/19/19 0823  BP: 132/70     Pulse: 81 77    Resp: 12 18    Temp: 98.2 F (36.8 C) 98.2 F (36.8 C)    TempSrc:  Other (Comment)    SpO2: 93% 93% 95% 94%  Weight:      Height:        Intake/Output Summary (Last 24 hours) at 09/19/2019 0850 Last data filed at 09/19/2019 0500 Gross per 24 hour  Intake 1754.57 ml  Output 2255 ml  Net -500.43 ml   Filed Weights   09/18/19 0752 09/18/19 1130 09/19/19 0500  Weight: 108.6 kg 108.9 kg 106.8 kg     REVIEW OF SYSTEMS  As per history otherwise all reviewed and reported negative  Exam:  General exam: awake, alert, red flushing of face. NAD.  Respiratory system: Coarse anterior upper airway congestion, Rales heard right lower lobe and expiratory wheezes. Cardiovascular system: S1 & S2 heard. No JVD, murmurs, gallops, clicks or pedal edema. Gastrointestinal system: Abdomen is nondistended, soft and nontender. Normal bowel sounds heard. Central nervous system: Alert and oriented. No focal neurological deficits. Extremities: no CCE.  Data Reviewed: Basic Metabolic Panel: Recent Labs  Lab 09/12/19 0929 09/18/19 0747 09/19/19 0455  NA 138 139 138  K 3.9 4.3 4.2  CL 101 99 108  CO2 27 26 20*  GLUCOSE 198* 134* 150*  BUN 23 19 27*  CREATININE 1.07 1.16 0.84  CALCIUM 9.0 9.1 8.0*  MG  --   --  2.2   Liver Function Tests: Recent Labs  Lab 09/12/19 0929 09/18/19 0747 09/19/19 0455  AST 35 30 19  ALT 39 31 23  ALKPHOS  78 102 72  BILITOT 1.0 1.2 1.0  PROT 7.1 7.5 6.1*  ALBUMIN 3.8 4.0 2.9*   No results for input(s): LIPASE, AMYLASE in the last 168 hours. No results for input(s): AMMONIA in the last 168 hours. CBC: Recent Labs  Lab 09/12/19 0929 09/18/19 0747 09/19/19 0455  WBC 5.4 7.6 8.0  NEUTROABS 3.3 4.3 6.9  HGB 17.1* 17.9* 16.2  HCT 51.9 56.3* 49.8  MCV 92.2 96.2 95.2  PLT 192 195 179   Cardiac Enzymes: No results for  input(s): CKTOTAL, CKMB, CKMBINDEX, TROPONINI in the last 168 hours. CBG (last 3)  Recent Labs    09/18/19 2109 09/19/19 0356 09/19/19 0745  GLUCAP 127* 181* 140*   Recent Results (from the past 240 hour(s))  Aspergillus Ag, BAL/Serum     Status: None   Collection Time: 09/12/19  9:29 AM  Result Value Ref Range Status   Aspergillus Ag, BAL/Serum 0.04 0.00 - 0.49 Index Final    Comment: (NOTE) Performed At: [facility] [address] [place], [place] [id number] Rush Farmer MD [id number] Performed At: [facility] [address] [place], [place] [id number] Katina Degree MDPhD [id number]   Culture, blood (Routine x 2)     Status: None (Preliminary result)   Collection Time: 09/18/19  7:47 AM   Specimen: BLOOD  Result Value Ref Range Status   Specimen Description BLOOD  Final   Special Requests NONE  Final   Culture   Final    NO GROWTH < 24 HOURS Performed at [facility], [address], [place], [place] [address]    Report Status PENDING  Incomplete  Culture, blood (Routine x 2)     Status: None (Preliminary result)   Collection Time: 09/18/19  7:52 AM   Specimen: Right Antecubital; Blood  Result Value Ref Range Status   Specimen Description RIGHT ANTECUBITAL  Final   Special Requests   Final    Blood Culture results may not be optimal due to an inadequate volume of blood received in culture bottles BOTTLES DRAWN AEROBIC AND ANAEROBIC   Culture   Final    NO GROWTH < 24 HOURS Performed at [facility], [address] [place], [place] [address]    Report Status PENDING  Incomplete  SARS Coronavirus 2 [facility] order, Performed in [facility] hospital lab) Nasopharyngeal Nasopharyngeal Swab     Status: None   Collection Time: 09/18/19  8:19 AM   Specimen: Nasopharyngeal Swab  Result Value Ref Range Status   SARS Coronavirus 2 NEGATIVE NEGATIVE Final    Comment: (NOTE) If result is NEGATIVE SARS-CoV-2 target nucleic acids are NOT DETECTED. The  SARS-CoV-2 RNA is generally detectable in upper and lower  respiratory specimens during the acute phase of infection. The lowest  concentration of SARS-CoV-2 viral copies this assay can detect is 250  copies / mL. A negative result does not preclude SARS-CoV-2 infection  and should not be used as the sole basis for treatment or other  patient management decisions.  A negative result may occur with  improper specimen collection / handling, submission of specimen other  than nasopharyngeal swab, presence of viral mutation(s) within the  areas targeted by this assay, and inadequate number of viral copies  (<250 copies / mL). A negative result must be combined with clinical  observations, patient history, and epidemiological information. If result is POSITIVE SARS-CoV-2 target nucleic acids are DETECTED. The SARS-CoV-2 RNA is generally detectable in upper and lower  respiratory specimens dur ing  the acute phase of infection.  Positive  results are indicative of active infection with SARS-CoV-2.  Clinical  correlation with patient history and other diagnostic information is  necessary to determine patient infection status.  Positive results do  not rule out bacterial infection or co-infection with other viruses. If result is PRESUMPTIVE POSTIVE SARS-CoV-2 nucleic acids MAY BE PRESENT.   A presumptive positive result was obtained on the submitted specimen  and confirmed on repeat testing.  While 2019 novel coronavirus  (SARS-CoV-2) nucleic acids may be present in the submitted sample  additional confirmatory testing may be necessary for epidemiological  and / or clinical management purposes  to differentiate between  SARS-CoV-2 and other Sarbecovirus currently known to infect humans.  If clinically indicated additional testing with an alternate test  methodology [phone]) is advised. The SARS-CoV-2 RNA is generally  detectable in upper and lower respiratory sp ecimens during the acute   phase of infection. The expected result is Negative. Fact Sheet for Patients:  StrictlyIdeas.no Fact Sheet for Healthcare Providers: BankingDealers.co.za This test is not yet approved or cleared by the [place] FDA and has been authorized for detection and/or diagnosis of SARS-CoV-2 by FDA under an Emergency Use Authorization (EUA).  This EUA will remain in effect (meaning this test can be used) for the duration of the COVID-19 declaration under Section 564(b)(1) of the Act, 21 U.S.C. section 360bbb-3(b)(1), unless the authorization is terminated or revoked sooner. Performed at [facility], [address], [place], [place] [address]   MRSA PCR Screening     Status: None   Collection Time: 09/18/19 11:43 AM   Specimen: Nasal Mucosa; Nasopharyngeal  Result Value Ref Range Status   MRSA by PCR NEGATIVE NEGATIVE Final    Comment:        The GeneXpert MRSA Assay (FDA approved for NASAL specimens only), is one component of a comprehensive MRSA colonization surveillance program. It is not intended to diagnose MRSA infection nor to guide or monitor treatment for MRSA infections. Performed at [facility], [address], [place], [place] [address]      Studies: Ct Head Wo Contrast  Result Date: 09/18/2019 CLINICAL DATA:  Confusion EXAM: CT HEAD WITHOUT CONTRAST TECHNIQUE: Contiguous axial images were obtained from the base of the skull through the vertex without intravenous contrast. COMPARISON:  None. FINDINGS: Brain: No evidence of acute infarction, hemorrhage, hydrocephalus, extra-axial collection or mass lesion/mass effect. Vascular: No hyperdense vessel or unexpected calcification. Skull: Normal. Negative for fracture or focal lesion. Sinuses/Orbits: No acute finding. Other: None. IMPRESSION: No acute intracranial pathology. Electronically Signed   By: Eddie Candle M.D.   On: 09/18/2019 14:53   Portable Chest 1 View  Result  Date: 09/19/2019 CLINICAL DATA:  Fever, cough. EXAM: PORTABLE CHEST 1 VIEW COMPARISON:  Radiograph of September 18, 2019. FINDINGS: Stable cardiomediastinal silhouette. Left-sided pacemaker is unchanged in position. No pneumothorax or pleural effusion is noted. Mildly decreased right lung opacity is noted suggesting improving pneumonia. Mild left basilar atelectasis or infiltrate is noted. Bony thorax is unremarkable. IMPRESSION: Mildly decreased right lung opacity is noted suggesting improving pneumonia. Mild left basilar subsegmental atelectasis or infiltrate is noted. Electronically Signed   By: Marijo Conception M.D.   On: 09/19/2019 07:45   Dg Chest Portable 1 View  Result Date: 09/18/2019 CLINICAL DATA:  Code sepsis.  Restless.  Fever. EXAM: PORTABLE CHEST 1 VIEW COMPARISON:  07/07/2019 FINDINGS: Study mildly degraded by motion. Cardiac silhouette normal in size. No mediastinal or hilar  masses. Mild hazy opacity is noted in the right mid to lower lung, which is accentuated by respiratory motion. Mild opacity at the left lung base consistent with atelectasis or scarring. Lungs otherwise clear. No convincing effusion. No pneumothorax. Left anterior chest wall sequential pacemaker is stable. IMPRESSION: 1. Hazy opacity in the right mid to lower lung. This is consistent with pneumonia proper clinical setting. No other evidence of acute cardiopulmonary disease. Electronically Signed   By: Lajean Manes M.D.   On: 09/18/2019 08:55   Scheduled Meds: . acetaminophen  650 mg Oral Q6H   Or  . acetaminophen  650 mg Rectal Q6H  . bisoprolol  7.5 mg Oral BID  . Chlorhexidine Gluconate Cloth  6 each Topical Daily  . cloNIDine  0.1 mg Oral BID  . flecainide  75 mg Oral BID  . guaiFENesin  1,200 mg Oral BID  . insulin aspart  0-20 Units Subcutaneous TID WC  . insulin aspart  0-5 Units Subcutaneous QHS  . insulin aspart  5 Units Subcutaneous TID WC  . ipratropium-albuterol  3 mL Nebulization Q4H  .  levothyroxine  137 mcg Oral QAC breakfast  . mouth rinse  15 mL Mouth Rinse q12n4p  . mometasone-formoterol  2 puff Inhalation BID  . montelukast  10 mg Oral QHS  . multivitamin with minerals  1 tablet Oral Daily  . pantoprazole  40 mg Oral BID AC  . rivaroxaban  20 mg Oral Q supper  . voriconazole  200 mg Oral BID   Continuous Infusions: . sodium chloride 50 mL/hr at 09/19/19 0757  . piperacillin-tazobactam (ZOSYN)  IV 3.375 g (09/19/19 0548)    Principal Problem:   Acute on chronic respiratory failure (HCC) Active Problems:   Severe sepsis (HCC)   Elevated lactic acid level   Essential hypertension, benign   Premature ventricular contractions   PPM-St.Jude   PVC's (premature ventricular contractions)   Sinoatrial node dysfunction (HCC)   Persistent atrial fibrillation   Hyperthyroidism   Hyperlipidemia   RBBB (right bundle branch block)   Carotid artery stenosis   Type 2 diabetes mellitus (HCC)   OSA (obstructive sleep apnea)   Severe persistent asthma   CAP (community acquired pneumonia)   Chronic bronchitis (HCC)   GERD (gastroesophageal reflux disease)  Critical Care Time spent: 53 mins  Irwin Brakeman, MD [facility] 09/19/2019, 8:50 AM    LOS: 1 day  How to contact the [facility] Attending or Consulting provider [place] or covering provider during after hours [place], for this patient?  1. Check the care team in [facility] and look for a) attending/consulting TRH provider listed and b) the [facility] team listed 2. Log into [email or web address] and use [place]'s universal password to access. If you do not have the password, please contact the hospital operator. 3. Locate the [facility] provider you are looking for under [facility] and page to a number that you can be directly reached. 4. If you still have difficulty reaching the provider, please page the [facility] (Director on Call) for the Hospitalists listed on amion for assistance.

## 2019-09-20 LAB — COMPREHENSIVE METABOLIC PANEL
ALT: 21 U/L (ref 0–44)
AST: 18 U/L (ref 15–41)
Albumin: 2.8 g/dL — ABNORMAL LOW (ref 3.5–5.0)
Alkaline Phosphatase: 58 U/L (ref 38–126)
Anion gap: 6 (ref 5–15)
BUN: 34 mg/dL — ABNORMAL HIGH (ref 8–23)
CO2: 25 mmol/L (ref 22–32)
Calcium: 8.2 mg/dL — ABNORMAL LOW (ref 8.9–10.3)
Chloride: 109 mmol/L (ref 98–111)
Creatinine, Ser: 0.96 mg/dL (ref 0.61–1.24)
GFR calc Af Amer: >60 mL/min (ref >60)
GFR calc non Af Amer: >60 mL/min (ref >60)
Glucose, Bld: 161 mg/dL — ABNORMAL HIGH (ref 70–99)
Potassium: 4.6 mmol/L (ref 3.5–5.1)
Sodium: 140 mmol/L (ref 135–145)
Total Bilirubin: 0.6 mg/dL (ref 0.3–1.2)
Total Protein: 6.1 g/dL — ABNORMAL LOW (ref 6.5–8.1)

## 2019-09-20 LAB — MAGNESIUM: Magnesium: 2.4 mg/dL (ref 1.7–2.4)

## 2019-09-20 LAB — CBC WITH DIFFERENTIAL/PLATELET
Abs Immature Granulocytes: 0.02 10*3/uL (ref 0.00–0.07)
Basophils Absolute: 0 10*3/uL (ref 0.0–0.1)
Basophils Relative: 0 %
Eosinophils Absolute: 0 10*3/uL (ref 0.0–0.5)
Eosinophils Relative: 0 %
HCT: 47.7 % (ref 39.0–52.0)
Hemoglobin: 15.2 g/dL (ref 13.0–17.0)
Immature Granulocytes: 0 %
Lymphocytes Relative: 18 %
Lymphs Abs: 1.3 10*3/uL (ref 0.7–4.0)
MCH: 30.7 pg (ref 26.0–34.0)
MCHC: 31.9 g/dL (ref 30.0–36.0)
MCV: 96.4 fL (ref 80.0–100.0)
Monocytes Absolute: 0.7 10*3/uL (ref 0.1–1.0)
Monocytes Relative: 9 %
Neutro Abs: 5.2 10*3/uL (ref 1.7–7.7)
Neutrophils Relative %: 73 %
Platelets: 183 10*3/uL (ref 150–400)
RBC: 4.95 MIL/uL (ref 4.22–5.81)
RDW: 13.2 % (ref 11.5–15.5)
WBC: 7.1 10*3/uL (ref 4.0–10.5)
nRBC: 0 % (ref 0.0–0.2)

## 2019-09-20 LAB — GLUCOSE, CAPILLARY
Glucose-Capillary: 158 mg/dL — ABNORMAL HIGH (ref 70–99)
Glucose-Capillary: 153 mg/dL — ABNORMAL HIGH (ref 70–99)
Glucose-Capillary: 214 mg/dL — ABNORMAL HIGH (ref 70–99)
Glucose-Capillary: 156 mg/dL — ABNORMAL HIGH (ref 70–99)
Glucose-Capillary: 123 mg/dL — ABNORMAL HIGH (ref 70–99)

## 2019-09-20 NOTE — Progress Notes (Signed)
PROGRESS NOTE Clarence Dawson  [id number]  DOB: 12/03/1946  DOA: 09/18/2019 PCP: [name], MD  Brief Admission Hx: 73 y/o male with severe asthma and COPD, aspergillosis and atrial fibrillation and s/p Pacemaker placement present with fever, confusion, and RUL/RLL pneumonia.   MDM/Assessment & Plan:   1. Acute on chronic respiratory failure secondary to pneumonia-he has been admitted to the stepdown ICU and is being treated with IV antibiotics.  He is improving.  DC steroids.  Continue antibiotics.  Continue supportive therapy.  Obtain SLP evaluation for possible aspiration. 2. Severe sepsis as lactate is trending down to normal.  He was treated with IV antibiotics fluids and supportive therapy.  Clinically is improving. 3. Severe persistent asthma and COPD- he was started on IV steroids and bronchodilators and he is improving.  I asked pulmonary to see him today given his history of aspergillosis and severe persistent asthma and COPD. 4. 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  We have resumed all his rate control medications. 5. Chronic atrial fibrillation- continue rivaroxaban for full anticoagulation and resumed all his rate control medications. 6. GERD-Protonix ordered for GI protection. 7. Type 2 diabetes mellitus-SSI coverage and prandial coverage ordered" continue to monitor closely. 8. Fever-continue Tylenol as needed for symptoms. 9. OSA- patient apparently does not tolerate CPAP 10. Hypertension-blood pressure medicines ordered and will follow closely. 11. Agitation and delirium-this is improving with treatments.  CT head negative for acute findings.  DVT prophylaxis: Rivaroxaban Code Status: Full Family Communication: Wife at bedside Disposition Plan: transfer to telemetry  Consultants:  pulmonology  Procedures:    Antimicrobials:  Vancomycin  Zosyn   Subjective: Pt says that he is breathing a little bit better today.     Objective: Vitals:   09/20/19 0836 09/20/19 0900 09/20/19 1028 09/20/19 1100  BP:  121/71 113/65 110/66  Pulse:  80  78  Resp:    16  Temp:  97.9 F (36.6 C)  98.4 F (36.9 C)  TempSrc:      SpO2: 95% 92%  93%  Weight:      Height:        Intake/Output Summary (Last 24 hours) at 09/20/2019 1125 Last data filed at 09/20/2019 0802 Gross per 24 hour  Intake 1594.5 ml  Output 2475 ml  Net -880.5 ml   Filed Weights   09/18/19 1130 09/19/19 0500 09/20/19 0500  Weight: 108.9 kg 106.8 kg 106 kg   REVIEW OF SYSTEMS  As per history otherwise all reviewed and reported negative  Exam:  General exam: awake, alert, red flushing of face nearly resolved. NAD.  Respiratory system: Coarse anterior upper airway congestion, improved exp wheezing. Cardiovascular system: S1 & S2 heard. No JVD, murmurs, gallops, clicks or pedal edema. Gastrointestinal system: Abdomen is nondistended, soft and nontender. Normal bowel sounds heard. Central nervous system: Alert and oriented. No focal neurological deficits. Extremities: no CCE.  Data Reviewed: Basic Metabolic Panel: Recent Labs  Lab 09/18/19 0747 09/19/19 0455 09/20/19 0411  NA 139 138 140  K 4.3 4.2 4.6  CL 99 108 109  CO2 26 20* 25  GLUCOSE 134* 150* 161*  BUN 19 27* 34*  CREATININE 1.16 0.84 0.96  CALCIUM 9.1 8.0* 8.2*  MG  --  2.2 2.4   Liver Function Tests: Recent Labs  Lab 09/18/19 0747 09/19/19 0455 09/20/19 0411  AST _0 ALT _1 ALKPHOS 102 72 58  BILITOT 1.2 1.0 0.6  PROT 7.5 6.1* 6.1*  ALBUMIN 4.0 2.9* 2.8*   No results for input(s): LIPASE, AMYLASE in the last 168 hours. No results for input(s): AMMONIA in the last 168 hours. CBC: Recent Labs  Lab 09/18/19 0747 09/19/19 0455 09/20/19 0411  WBC 7.6 8.0 7.1  NEUTROABS 4.3 6.9 5.2  HGB 17.9* 16.2 15.2  HCT 56.3* 49.8 47.7  MCV 96.2 95.2 96.4  PLT 195 179 183   Cardiac Enzymes: No results for input(s): CKTOTAL, CKMB, CKMBINDEX, TROPONINI  in the last 168 hours. CBG (last 3)  Recent Labs    09/19/19 2153 09/20/19 0347 09/20/19 0736  GLUCAP 268* 158* 153*   Recent Results (from the past 240 hour(s))  Aspergillus Ag, BAL/Serum     Status: None   Collection Time: 09/12/19  9:29 AM  Result Value Ref Range Status   Aspergillus Ag, BAL/Serum 0.04 0.00 - 0.49 Index Final    Comment: (NOTE) Performed At: [facility] [address] [place], [place] [id number] Rush Farmer MD [id number] Performed At: [facility] [address] [place], [place] [id number] Katina Degree MDPhD [id number]   Culture, blood (Routine x 2)     Status: None (Preliminary result)   Collection Time: 09/18/19  7:47 AM   Specimen: BLOOD RIGHT HAND  Result Value Ref Range Status   Specimen Description BLOOD RIGHT HAND  Final   Special Requests   Final    BOTTLES DRAWN AEROBIC AND ANAEROBIC Blood Culture results may not be optimal due to an inadequate volume of blood received in culture bottles   Culture   Final    NO GROWTH 2 DAYS Performed at [facility], [address] [place], [place] [address]    Report Status PENDING  Incomplete  Culture, blood (Routine x 2)     Status: None (Preliminary result)   Collection Time: 09/18/19  7:52 AM   Specimen: Right Antecubital; Blood  Result Value Ref Range Status   Specimen Description RIGHT ANTECUBITAL  Final   Special Requests   Final    Blood Culture results may not be optimal due to an inadequate volume of blood received in culture bottles BOTTLES DRAWN AEROBIC AND ANAEROBIC   Culture   Final    NO GROWTH 2 DAYS Performed at [facility], [address] [place], [place] [address]    Report Status PENDING  Incomplete  Urine culture     Status: None   Collection Time: 09/18/19  7:59 AM   Specimen: In/Out Cath Urine  Result Value Ref Range Status   Specimen Description   Final    IN/OUT CATH URINE Performed at [facility], [address] [place], [place] [address]    Special  Requests   Final    NONE Performed at [facility], [address] [place], [place] [address]    Culture   Final    NO GROWTH Performed at [facility], [place] [address], [place], [place] [address]    Report Status 09/19/2019 FINAL  Final  SARS Coronavirus 2 [facility] order, Performed in [facility] hospital lab) Nasopharyngeal Nasopharyngeal Swab     Status: None   Collection Time: 09/18/19  8:19 AM   Specimen: Nasopharyngeal Swab  Result Value Ref Range Status   SARS Coronavirus 2 NEGATIVE NEGATIVE Final    Comment: (NOTE) If result is NEGATIVE SARS-CoV-2 target nucleic acids are NOT DETECTED. The SARS-CoV-2 RNA is generally detectable in upper and lower  respiratory specimens during the acute phase of infection. The lowest  concentration  of SARS-CoV-2 viral copies this assay can detect is 250  copies / mL. A negative result does not preclude SARS-CoV-2 infection  and should not be used as the sole basis for treatment or other  patient management decisions.  A negative result may occur with  improper specimen collection / handling, submission of specimen other  than nasopharyngeal swab, presence of viral mutation(s) within the  areas targeted by this assay, and inadequate number of viral copies  (<250 copies / mL). A negative result must be combined with clinical  observations, patient history, and epidemiological information. If result is POSITIVE SARS-CoV-2 target nucleic acids are DETECTED. The SARS-CoV-2 RNA is generally detectable in upper and lower  respiratory specimens dur ing the acute phase of infection.  Positive  results are indicative of active infection with SARS-CoV-2.  Clinical  correlation with patient history and other diagnostic information is  necessary to determine patient infection status.  Positive results do  not rule out bacterial infection or co-infection with other viruses. If result is PRESUMPTIVE POSTIVE SARS-CoV-2 nucleic acids MAY BE PRESENT.    A presumptive positive result was obtained on the submitted specimen  and confirmed on repeat testing.  While 2019 novel coronavirus  (SARS-CoV-2) nucleic acids may be present in the submitted sample  additional confirmatory testing may be necessary for epidemiological  and / or clinical management purposes  to differentiate between  SARS-CoV-2 and other Sarbecovirus currently known to infect humans.  If clinically indicated additional testing with an alternate test  methodology [phone]) is advised. The SARS-CoV-2 RNA is generally  detectable in upper and lower respiratory sp ecimens during the acute  phase of infection. The expected result is Negative. Fact Sheet for Patients:  StrictlyIdeas.no Fact Sheet for Healthcare Providers: BankingDealers.co.za This test is not yet approved or cleared by the [place] FDA and has been authorized for detection and/or diagnosis of SARS-CoV-2 by FDA under an Emergency Use Authorization (EUA).  This EUA will remain in effect (meaning this test can be used) for the duration of the COVID-19 declaration under Section 564(b)(1) of the Act, 21 U.S.C. section 360bbb-3(b)(1), unless the authorization is terminated or revoked sooner. Performed at [facility], [address] [place], [place] [address]   MRSA PCR Screening     Status: None   Collection Time: 09/18/19 11:43 AM   Specimen: Nasal Mucosa; Nasopharyngeal  Result Value Ref Range Status   MRSA by PCR NEGATIVE NEGATIVE Final    Comment:        The GeneXpert MRSA Assay (FDA approved for NASAL specimens only), is one component of a comprehensive MRSA colonization surveillance program. It is not intended to diagnose MRSA infection nor to guide or monitor treatment for MRSA infections. Performed at [facility], [address] [place], [place] [address]      Studies: Ct Head Wo Contrast  Result Date: 09/18/2019 CLINICAL DATA:   Confusion EXAM: CT HEAD WITHOUT CONTRAST TECHNIQUE: Contiguous axial images were obtained from the base of the skull through the vertex without intravenous contrast. COMPARISON:  None. FINDINGS: Brain: No evidence of acute infarction, hemorrhage, hydrocephalus, extra-axial collection or mass lesion/mass effect. Vascular: No hyperdense vessel or unexpected calcification. Skull: Normal. Negative for fracture or focal lesion. Sinuses/Orbits: No acute finding. Other: None. IMPRESSION: No acute intracranial pathology. Electronically Signed   By: Eddie Candle M.D.   On: 09/18/2019 14:53   Portable Chest 1 View  Result Date: 09/19/2019 CLINICAL DATA:  Fever, cough. EXAM: PORTABLE CHEST 1 VIEW  COMPARISON:  Radiograph of September 18, 2019. FINDINGS: Stable cardiomediastinal silhouette. Left-sided pacemaker is unchanged in position. No pneumothorax or pleural effusion is noted. Mildly decreased right lung opacity is noted suggesting improving pneumonia. Mild left basilar atelectasis or infiltrate is noted. Bony thorax is unremarkable. IMPRESSION: Mildly decreased right lung opacity is noted suggesting improving pneumonia. Mild left basilar subsegmental atelectasis or infiltrate is noted. Electronically Signed   By: Marijo Conception M.D.   On: 09/19/2019 07:45   Scheduled Meds: . acetaminophen  650 mg Oral Q6H   Or  . acetaminophen  650 mg Rectal Q6H  . bisoprolol  7.5 mg Oral BID  . Chlorhexidine Gluconate Cloth  6 each Topical Daily  . cloNIDine  0.1 mg Oral BID  . flecainide  75 mg Oral BID  . guaiFENesin  1,200 mg Oral BID  . insulin aspart  0-20 Units Subcutaneous TID WC  . insulin aspart  0-5 Units Subcutaneous QHS  . insulin aspart  5 Units Subcutaneous TID WC  . ipratropium-albuterol  3 mL Nebulization Q4H  . levothyroxine  137 mcg Oral QAC breakfast  . mouth rinse  15 mL Mouth Rinse q12n4p  . mometasone-formoterol  2 puff Inhalation BID  . montelukast  10 mg Oral QHS  . multivitamin with  minerals  1 tablet Oral Daily  . pantoprazole  40 mg Oral BID AC  . rivaroxaban  20 mg Oral Q supper  . voriconazole  200 mg Oral BID   Continuous Infusions: . sodium chloride 30 mL/hr at 09/20/19 0802  . piperacillin-tazobactam (ZOSYN)  IV 12.5 mL/hr at 09/20/19 0802    Principal Problem:   Acute on chronic respiratory failure (HCC) Active Problems:   Severe sepsis (HCC)   Elevated lactic acid level   Essential hypertension, benign   Premature ventricular contractions   PPM-St.Jude   PVC's (premature ventricular contractions)   Sinoatrial node dysfunction (HCC)   Persistent atrial fibrillation   Hyperthyroidism   Hyperlipidemia   RBBB (right bundle branch block)   Carotid artery stenosis   Type 2 diabetes mellitus (HCC)   OSA (obstructive sleep apnea)   Severe persistent asthma   CAP (community acquired pneumonia)   Chronic bronchitis (HCC)   GERD (gastroesophageal reflux disease)  Critical Care Time spent: 30 mins  Irwin Brakeman, MD [facility] 09/20/2019, 11:25 AM    LOS: 2 days  How to contact the [facility] Attending or Consulting provider [place] or covering provider during after hours [place], for this patient?  1. Check the care team in [facility] and look for a) attending/consulting [facility] provider listed and b) the [facility] team listed 2. Log into [email or web address] and use [place]'s universal password to access. If you do not have the password, please contact the hospital operator. 3. Locate the [facility] provider you are looking for under [facility] and page to a number that you can be directly reached. 4. If you still have difficulty reaching the provider, please page the [facility] (Director on Call) for the Hospitalists listed on amion for assistance.

## 2019-09-20 NOTE — Progress Notes (Signed)
Subjective: He was admitted with severe sepsis related to community-acquired pneumonia.  He has resolved sepsis pathophysiology.  He says he still coughing up some sputum.  He was substantially confused on admission and that is better but he does not feel like he is back to his baseline mental status yet.  At baseline he has severe persistent asthma and he is being treated for aspergillosis.  Objective: Vital signs in last 24 hours: Temp:  [97.5 F (36.4 C)-99.5 F (37.5 C)] 97.5 F (36.4 C) (09/22 0738) Pulse Rate:  [76-100] 81 (09/22 0738) Resp:  [8-23] 13 (09/22 0738) BP: (104-130)/(49-93) 123/71 (09/22 0700) SpO2:  [86 %-96 %] 92 % (09/22 0738) Weight:  [106 kg] 106 kg (09/22 0500) Weight change: -2.591 kg Last BM Date: 09/18/19  Intake/Output from previous day: 09/21 0701 - 09/22 0700 In: 4097 [I.V.:1227.4; IV Piggyback:100.6] Out: 2475 [Urine:2475]  PHYSICAL EXAM General appearance: alert, cooperative and no distress Resp: rhonchi bilaterally Cardio: His heart is regular and on the monitor it looks like it is paced GI: soft, non-tender; bowel sounds normal; no masses,  no organomegaly Extremities: extremities normal, atraumatic, no cyanosis or edema  Lab Results:  Results for orders placed or performed during the hospital encounter of 09/18/19 (from the past 48 hour(s))  APTT     Status: None   Collection Time: 09/18/19  7:59 AM  Result Value Ref Range   aPTT 36 24 - 36 seconds    Comment: Performed at [facility], [address], [place], [place] [address]  Urine culture     Status: None   Collection Time: 09/18/19  7:59 AM   Specimen: In/Out Cath Urine  Result Value Ref Range   Specimen Description      IN/OUT CATH URINE Performed at [facility], [address], [place], [place] [address]    Special Requests      NONE Performed at [facility], [address] [place], [place] [address]    Culture      NO GROWTH Performed at [facility], [place]  [address], [place], [place] [address]    Report Status 09/19/2019 FINAL   SARS Coronavirus 2 [facility] order, Performed in [facility] hospital lab) Nasopharyngeal Nasopharyngeal Swab     Status: None   Collection Time: 09/18/19  8:19 AM   Specimen: Nasopharyngeal Swab  Result Value Ref Range   SARS Coronavirus 2 NEGATIVE NEGATIVE    Comment: (NOTE) If result is NEGATIVE SARS-CoV-2 target nucleic acids are NOT DETECTED. The SARS-CoV-2 RNA is generally detectable in upper and lower  respiratory specimens during the acute phase of infection. The lowest  concentration of SARS-CoV-2 viral copies this assay can detect is 250  copies / mL. A negative result does not preclude SARS-CoV-2 infection  and should not be used as the sole basis for treatment or other  patient management decisions.  A negative result may occur with  improper specimen collection / handling, submission of specimen other  than nasopharyngeal swab, presence of viral mutation(s) within the  areas targeted by this assay, and inadequate number of viral copies  (<250 copies / mL). A negative result must be combined with clinical  observations, patient history, and epidemiological information. If result is POSITIVE SARS-CoV-2 target nucleic acids are DETECTED. The SARS-CoV-2 RNA is generally detectable in upper and lower  respiratory specimens dur ing the acute phase of infection.  Positive  results are indicative of active infection with SARS-CoV-2.  Clinical  correlation with patient  history and other diagnostic information is  necessary to determine patient infection status.  Positive results do  not rule out bacterial infection or co-infection with other viruses. If result is PRESUMPTIVE POSTIVE SARS-CoV-2 nucleic acids MAY BE PRESENT.   A presumptive positive result was obtained on the submitted specimen  and confirmed on repeat testing.  While 2019 novel coronavirus  (SARS-CoV-2) nucleic acids may be present in the  submitted sample  additional confirmatory testing may be necessary for epidemiological  and / or clinical management purposes  to differentiate between  SARS-CoV-2 and other Sarbecovirus currently known to infect humans.  If clinically indicated additional testing with an alternate test  methodology [phone]) is advised. The SARS-CoV-2 RNA is generally  detectable in upper and lower respiratory sp ecimens during the acute  phase of infection. The expected result is Negative. Fact Sheet for Patients:  StrictlyIdeas.no Fact Sheet for Healthcare Providers: BankingDealers.co.za This test is not yet approved or cleared by the [place] FDA and has been authorized for detection and/or diagnosis of SARS-CoV-2 by FDA under an Emergency Use Authorization (EUA).  This EUA will remain in effect (meaning this test can be used) for the duration of the COVID-19 declaration under Section 564(b)(1) of the Act, 21 U.S.C. section 360bbb-3(b)(1), unless the authorization is terminated or revoked sooner. Performed at [facility], [address], [place], [place] [address]   Lactic acid, plasma     Status: None   Collection Time: 09/18/19 10:07 AM  Result Value Ref Range   Lactic Acid, Venous 1.4 0.5 - 1.9 mmol/L    Comment: Performed at [facility], [address] [place], [place] [address]  MRSA PCR Screening     Status: None   Collection Time: 09/18/19 11:43 AM   Specimen: Nasal Mucosa; Nasopharyngeal  Result Value Ref Range   MRSA by PCR NEGATIVE NEGATIVE    Comment:        The GeneXpert MRSA Assay (FDA approved for NASAL specimens only), is one component of a comprehensive MRSA colonization surveillance program. It is not intended to diagnose MRSA infection nor to guide or monitor treatment for MRSA infections. Performed at [facility], [address] [place], [place] [address]   Glucose, capillary     Status: Abnormal   Collection  Time: 09/18/19 11:53 AM  Result Value Ref Range   Glucose-Capillary 162 (H) 70 - 99 mg/dL  Glucose, capillary     Status: Abnormal   Collection Time: 09/18/19  1:40 PM  Result Value Ref Range   Glucose-Capillary 171 (H) 70 - 99 mg/dL  Glucose, capillary     Status: Abnormal   Collection Time: 09/18/19  4:52 PM  Result Value Ref Range   Glucose-Capillary 122 (H) 70 - 99 mg/dL  Glucose, capillary     Status: Abnormal   Collection Time: 09/18/19  9:09 PM  Result Value Ref Range   Glucose-Capillary 127 (H) 70 - 99 mg/dL  Glucose, capillary     Status: Abnormal   Collection Time: 09/19/19  3:56 AM  Result Value Ref Range   Glucose-Capillary 181 (H) 70 - 99 mg/dL  Comprehensive metabolic panel     Status: Abnormal   Collection Time: 09/19/19  4:55 AM  Result Value Ref Range   Sodium 138 135 - 145 mmol/L   Potassium 4.2 3.5 - 5.1 mmol/L   Chloride 108 98 - 111 mmol/L   CO2 20 (L) 22 - 32 mmol/L   Glucose, Bld 150 (H) 70 - 99  mg/dL   BUN 27 (H) 8 - 23 mg/dL   Creatinine, Ser 0.84 0.61 - 1.24 mg/dL   Calcium 8.0 (L) 8.9 - 10.3 mg/dL   Total Protein 6.1 (L) 6.5 - 8.1 g/dL   Albumin 2.9 (L) 3.5 - 5.0 g/dL   AST 19 15 - 41 U/L   ALT 23 0 - 44 U/L   Alkaline Phosphatase 72 38 - 126 U/L   Total Bilirubin 1.0 0.3 - 1.2 mg/dL   GFR calc non Af Amer >60 >60 mL/min   GFR calc Af Amer >60 >60 mL/min   Anion gap 10 5 - 15    Comment: Performed at [facility], [address] [place], [place] [address]  Magnesium     Status: None   Collection Time: 09/19/19  4:55 AM  Result Value Ref Range   Magnesium 2.2 1.7 - 2.4 mg/dL    Comment: Performed at [facility], [address], [place], [place] [address]  CBC WITH DIFFERENTIAL     Status: None   Collection Time: 09/19/19  4:55 AM  Result Value Ref Range   WBC 8.0 4.0 - 10.5 K/uL   RBC 5.23 4.22 - 5.81 MIL/uL   Hemoglobin 16.2 13.0 - 17.0 g/dL   HCT 49.8 39.0 - 52.0 %   MCV 95.2 80.0 - 100.0 fL   MCH 31.0 26.0 - 34.0 pg   MCHC 32.5 30.0 -  36.0 g/dL   RDW 12.7 11.5 - 15.5 %   Platelets 179 150 - 400 K/uL   nRBC 0.0 0.0 - 0.2 %   Neutrophils Relative % 85 %   Neutro Abs 6.9 1.7 - 7.7 K/uL   Lymphocytes Relative 12 %   Lymphs Abs 0.9 0.7 - 4.0 K/uL   Monocytes Relative 3 %   Monocytes Absolute 0.2 0.1 - 1.0 K/uL   Eosinophils Relative 0 %   Eosinophils Absolute 0.0 0.0 - 0.5 K/uL   Basophils Relative 0 %   Basophils Absolute 0.0 0.0 - 0.1 K/uL   Immature Granulocytes 0 %   Abs Immature Granulocytes 0.02 0.00 - 0.07 K/uL    Comment: Performed at [facility], [address], [place], [place] [address]  Glucose, capillary     Status: Abnormal   Collection Time: 09/19/19  7:45 AM  Result Value Ref Range   Glucose-Capillary 140 (H) 70 - 99 mg/dL  Glucose, capillary     Status: Abnormal   Collection Time: 09/19/19 11:11 AM  Result Value Ref Range   Glucose-Capillary 219 (H) 70 - 99 mg/dL  Glucose, capillary     Status: Abnormal   Collection Time: 09/19/19  4:15 PM  Result Value Ref Range   Glucose-Capillary 240 (H) 70 - 99 mg/dL  Glucose, capillary     Status: Abnormal   Collection Time: 09/19/19  9:53 PM  Result Value Ref Range   Glucose-Capillary 268 (H) 70 - 99 mg/dL   Comment 1 Notify RN    Comment 2 Document in Chart   Glucose, capillary     Status: Abnormal   Collection Time: 09/20/19  3:47 AM  Result Value Ref Range   Glucose-Capillary 158 (H) 70 - 99 mg/dL   Comment 1 Notify RN    Comment 2 Document in Chart   Comprehensive metabolic panel     Status: Abnormal   Collection Time: 09/20/19  4:11 AM  Result Value Ref Range   Sodium 140 135 - 145 mmol/L   Potassium 4.6 3.5 - 5.1  mmol/L   Chloride 109 98 - 111 mmol/L   CO2 25 22 - 32 mmol/L   Glucose, Bld 161 (H) 70 - 99 mg/dL   BUN 34 (H) 8 - 23 mg/dL   Creatinine, Ser 0.96 0.61 - 1.24 mg/dL   Calcium 8.2 (L) 8.9 - 10.3 mg/dL   Total Protein 6.1 (L) 6.5 - 8.1 g/dL   Albumin 2.8 (L) 3.5 - 5.0 g/dL   AST 18 15 - 41 U/L   ALT 21 0 - 44 U/L   Alkaline  Phosphatase 58 38 - 126 U/L   Total Bilirubin 0.6 0.3 - 1.2 mg/dL   GFR calc non Af Amer >60 >60 mL/min   GFR calc Af Amer >60 >60 mL/min   Anion gap 6 5 - 15    Comment: Performed at [facility], [address], [place], [place] [address]  Magnesium     Status: None   Collection Time: 09/20/19  4:11 AM  Result Value Ref Range   Magnesium 2.4 1.7 - 2.4 mg/dL    Comment: Performed at [facility], [address] [place], [place] [address]  CBC WITH DIFFERENTIAL     Status: None   Collection Time: 09/20/19  4:11 AM  Result Value Ref Range   WBC 7.1 4.0 - 10.5 K/uL   RBC 4.95 4.22 - 5.81 MIL/uL   Hemoglobin 15.2 13.0 - 17.0 g/dL   HCT 47.7 39.0 - 52.0 %   MCV 96.4 80.0 - 100.0 fL   MCH 30.7 26.0 - 34.0 pg   MCHC 31.9 30.0 - 36.0 g/dL   RDW 13.2 11.5 - 15.5 %   Platelets 183 150 - 400 K/uL   nRBC 0.0 0.0 - 0.2 %   Neutrophils Relative % 73 %   Neutro Abs 5.2 1.7 - 7.7 K/uL   Lymphocytes Relative 18 %   Lymphs Abs 1.3 0.7 - 4.0 K/uL   Monocytes Relative 9 %   Monocytes Absolute 0.7 0.1 - 1.0 K/uL   Eosinophils Relative 0 %   Eosinophils Absolute 0.0 0.0 - 0.5 K/uL   Basophils Relative 0 %   Basophils Absolute 0.0 0.0 - 0.1 K/uL   Immature Granulocytes 0 %   Abs Immature Granulocytes 0.02 0.00 - 0.07 K/uL    Comment: Performed at [facility], [address] [place], [place] [address]  Glucose, capillary     Status: Abnormal   Collection Time: 09/20/19  7:36 AM  Result Value Ref Range   Glucose-Capillary 153 (H) 70 - 99 mg/dL    ABGS No results for input(s): PHART, PO2ART, TCO2, HCO3 in the last 72 hours.  Invalid input(s): PCO2 CULTURES Recent Results (from the past 240 hour(s))  Aspergillus Ag, BAL/Serum     Status: None   Collection Time: 09/12/19  9:29 AM  Result Value Ref Range Status   Aspergillus Ag, BAL/Serum 0.04 0.00 - 0.49 Index Final    Comment: (NOTE) Performed At: [facility] [address] [place], [place] [id number] Rush Farmer MD  [id number] Performed At: [facility] [address] [place], [place] [id number] Katina Degree MDPhD [id number]   Culture, blood (Routine x 2)     Status: None (Preliminary result)   Collection Time: 09/18/19  7:47 AM   Specimen: BLOOD RIGHT HAND  Result Value Ref Range Status   Specimen Description BLOOD RIGHT HAND  Final   Special Requests   Final    BOTTLES DRAWN AEROBIC AND ANAEROBIC Blood Culture results may not  be optimal due to an inadequate volume of blood received in culture bottles   Culture   Final    NO GROWTH < 24 HOURS Performed at [facility], [address], [place], [place] [address]    Report Status PENDING  Incomplete  Culture, blood (Routine x 2)     Status: None (Preliminary result)   Collection Time: 09/18/19  7:52 AM   Specimen: Right Antecubital; Blood  Result Value Ref Range Status   Specimen Description RIGHT ANTECUBITAL  Final   Special Requests   Final    Blood Culture results may not be optimal due to an inadequate volume of blood received in culture bottles BOTTLES DRAWN AEROBIC AND ANAEROBIC   Culture   Final    NO GROWTH < 24 HOURS Performed at [facility], [address], [place], [place] [address]    Report Status PENDING  Incomplete  Urine culture     Status: None   Collection Time: 09/18/19  7:59 AM   Specimen: In/Out Cath Urine  Result Value Ref Range Status   Specimen Description   Final    IN/OUT CATH URINE Performed at [facility], [address], [place], [place] [address]    Special Requests   Final    NONE Performed at [facility], [address] [place], [place] [address]    Culture   Final    NO GROWTH Performed at [facility], [place] [address] [place], [place] [address]    Report Status 09/19/2019 FINAL  Final  SARS Coronavirus 2 [facility] order, Performed in [facility] hospital lab) Nasopharyngeal Nasopharyngeal Swab     Status: None   Collection Time: 09/18/19  8:19 AM   Specimen: Nasopharyngeal Swab  Result  Value Ref Range Status   SARS Coronavirus 2 NEGATIVE NEGATIVE Final    Comment: (NOTE) If result is NEGATIVE SARS-CoV-2 target nucleic acids are NOT DETECTED. The SARS-CoV-2 RNA is generally detectable in upper and lower  respiratory specimens during the acute phase of infection. The lowest  concentration of SARS-CoV-2 viral copies this assay can detect is 250  copies / mL. A negative result does not preclude SARS-CoV-2 infection  and should not be used as the sole basis for treatment or other  patient management decisions.  A negative result may occur with  improper specimen collection / handling, submission of specimen other  than nasopharyngeal swab, presence of viral mutation(s) within the  areas targeted by this assay, and inadequate number of viral copies  (<250 copies / mL). A negative result must be combined with clinical  observations, patient history, and epidemiological information. If result is POSITIVE SARS-CoV-2 target nucleic acids are DETECTED. The SARS-CoV-2 RNA is generally detectable in upper and lower  respiratory specimens dur ing the acute phase of infection.  Positive  results are indicative of active infection with SARS-CoV-2.  Clinical  correlation with patient history and other diagnostic information is  necessary to determine patient infection status.  Positive results do  not rule out bacterial infection or co-infection with other viruses. If result is PRESUMPTIVE POSTIVE SARS-CoV-2 nucleic acids MAY BE PRESENT.   A presumptive positive result was obtained on the submitted specimen  and confirmed on repeat testing.  While 2019 novel coronavirus  (SARS-CoV-2) nucleic acids may be present in the submitted sample  additional confirmatory testing may be necessary for epidemiological  and / or clinical management purposes  to differentiate between  SARS-CoV-2 and other Sarbecovirus currently known to infect  humans.  If clinically indicated additional testing  with an alternate test  methodology [phone]) is advised. The SARS-CoV-2 RNA is generally  detectable in upper and lower respiratory sp ecimens during the acute  phase of infection. The expected result is Negative. Fact Sheet for Patients:  StrictlyIdeas.no Fact Sheet for Healthcare Providers: BankingDealers.co.za This test is not yet approved or cleared by the [place] FDA and has been authorized for detection and/or diagnosis of SARS-CoV-2 by FDA under an Emergency Use Authorization (EUA).  This EUA will remain in effect (meaning this test can be used) for the duration of the COVID-19 declaration under Section 564(b)(1) of the Act, 21 U.S.C. section 360bbb-3(b)(1), unless the authorization is terminated or revoked sooner. Performed at [facility], [address] [place], [place] [address]   MRSA PCR Screening     Status: None   Collection Time: 09/18/19 11:43 AM   Specimen: Nasal Mucosa; Nasopharyngeal  Result Value Ref Range Status   MRSA by PCR NEGATIVE NEGATIVE Final    Comment:        The GeneXpert MRSA Assay (FDA approved for NASAL specimens only), is one component of a comprehensive MRSA colonization surveillance program. It is not intended to diagnose MRSA infection nor to guide or monitor treatment for MRSA infections. Performed at [facility], [address], [place], [place] [address]    Studies/Results: Ct Head Wo Contrast  Result Date: 09/18/2019 CLINICAL DATA:  Confusion EXAM: CT HEAD WITHOUT CONTRAST TECHNIQUE: Contiguous axial images were obtained from the base of the skull through the vertex without intravenous contrast. COMPARISON:  None. FINDINGS: Brain: No evidence of acute infarction, hemorrhage, hydrocephalus, extra-axial collection or mass lesion/mass effect. Vascular: No hyperdense vessel or unexpected calcification. Skull: Normal. Negative for fracture or focal lesion. Sinuses/Orbits: No acute  finding. Other: None. IMPRESSION: No acute intracranial pathology. Electronically Signed   By: Eddie Candle M.D.   On: 09/18/2019 14:53   Portable Chest 1 View  Result Date: 09/19/2019 CLINICAL DATA:  Fever, cough. EXAM: PORTABLE CHEST 1 VIEW COMPARISON:  Radiograph of September 18, 2019. FINDINGS: Stable cardiomediastinal silhouette. Left-sided pacemaker is unchanged in position. No pneumothorax or pleural effusion is noted. Mildly decreased right lung opacity is noted suggesting improving pneumonia. Mild left basilar atelectasis or infiltrate is noted. Bony thorax is unremarkable. IMPRESSION: Mildly decreased right lung opacity is noted suggesting improving pneumonia. Mild left basilar subsegmental atelectasis or infiltrate is noted. Electronically Signed   By: Marijo Conception M.D.   On: 09/19/2019 07:45   Dg Chest Portable 1 View  Result Date: 09/18/2019 CLINICAL DATA:  Code sepsis.  Restless.  Fever. EXAM: PORTABLE CHEST 1 VIEW COMPARISON:  07/07/2019 FINDINGS: Study mildly degraded by motion. Cardiac silhouette normal in size. No mediastinal or hilar masses. Mild hazy opacity is noted in the right mid to lower lung, which is accentuated by respiratory motion. Mild opacity at the left lung base consistent with atelectasis or scarring. Lungs otherwise clear. No convincing effusion. No pneumothorax. Left anterior chest wall sequential pacemaker is stable. IMPRESSION: 1. Hazy opacity in the right mid to lower lung. This is consistent with pneumonia proper clinical setting. No other evidence of acute cardiopulmonary disease. Electronically Signed   By: Lajean Manes M.D.   On: 09/18/2019 08:55    Medications:  Prior to Admission:  Facility-Administered Medications Prior to Admission  Medication Dose Route Frequency Provider Last Rate Last Dose  . omalizumab Arvid Right) injection 300 mg  300 mg Subcutaneous Q14 Days [name],  MD   300 mg at 02/09/19 1519   Medications Prior to Admission   Medication Sig Dispense Refill Last Dose  . acetaminophen (TYLENOL) 500 MG tablet Take 1,000 mg by mouth 3 (three) times daily.   09/18/2019 at 0415  . Aclidinium Bromide (TUDORZA PRESSAIR) 400 MCG/ACT AEPB 1 puff BID 1 each 2 09/17/2019 at Unknown time  . albuterol (ACCUNEB) 0.63 MG/3ML nebulizer solution Take 3 mLs by nebulization every 4 (four) hours as needed for shortness of breath.     Marland Kitchen albuterol (PROVENTIL HFA;VENTOLIN HFA) 108 (90 Base) MCG/ACT inhaler Inhale 2 puffs every 4 (four) hours as needed into the lungs for shortness of breath (only if you can't catch your breath).     Marland Kitchen amLODipine (NORVASC) 5 MG tablet Take 1 tablet (5 mg total) by mouth daily. 90 tablet 3 09/17/2019 at Unknown time  . bisoprolol (ZEBETA) 5 MG tablet TAKE 1 AND 1/2 TABLET BY MOUTH TWICE DAILY. 90 tablet 3 09/17/2019 at Unknown time  . canagliflozin (INVOKANA) 300 MG TABS tablet Take 300 mg by mouth daily before breakfast.   09/17/2019 at Unknown time  . Carboxymethylcellul-Glycerin (LUBRICATING EYE DROPS OP) Place 1 drop into the right eye daily as needed (dry eyes).   09/17/2019 at Unknown time  . cloNIDine (CATAPRES) 0.1 MG tablet Take 0.1 mg by mouth 2 (two) times daily.   09/17/2019 at Unknown time  . diphenhydramine-acetaminophen (TYLENOL PM) 25-500 MG TABS tablet Take 1 tablet by mouth at bedtime.     Marland Kitchen EPINEPHrine 0.3 mg/0.3 mL IJ SOAJ injection Inject 0.3 mg into the muscle as needed for anaphylaxis.    Past Month at Unknown time  . flecainide (TAMBOCOR) 150 MG tablet Take 0.5 tablets (75 mg total) by mouth 2 (two) times daily. 90 tablet 3 09/17/2019 at Unknown time  . glipiZIDE (GLUCOTROL XL) 5 MG 24 hr tablet Take 5 mg by mouth daily before breakfast.   09/17/2019 at Unknown time  . guaiFENesin (MUCINEX) 600 MG 12 hr tablet Take 600 mg by mouth 2 (two) times daily.    09/17/2019 at Unknown time  . hydrocortisone cream 1 % Apply 1 application topically daily as needed for itching.     . levothyroxine (SYNTHROID,  LEVOTHROID) 137 MCG tablet Take 137 mcg by mouth daily before breakfast.    09/17/2019 at Unknown time  . losartan-hydrochlorothiazide (HYZAAR) 100-25 MG per tablet Take 1 tablet by mouth daily.   09/17/2019 at Unknown time  . metFORMIN (GLUCOPHAGE-XR) 500 MG 24 hr tablet Take 1,000 mg by mouth at bedtime.    09/17/2019 at Unknown time  . montelukast (SINGULAIR) 10 MG tablet TAKE (1) TABLET BY MOUTH AT BEDTIME. 30 tablet 0 09/17/2019 at Unknown time  . Multiple Vitamin (MULTIVITAMIN) tablet Take 1 tablet by mouth daily.     09/17/2019 at Unknown time  . omeprazole (PRILOSEC OTC) 20 MG tablet Take 20 mg by mouth daily.    09/17/2019 at Unknown time  . rivaroxaban (XARELTO) 20 MG TABS tablet Take 20 mg daily with supper by mouth.   09/17/2019 at Unknown time  . voriconazole (VFEND) 200 MG tablet Take 1 tablet (200 mg total) by mouth 2 (two) times daily. 180 tablet 0 09/17/2019 at Unknown time  . WIXELA INHUB 500-50 MCG/DOSE AEPB INHALE 1 PUFF INTO THE LUNGS TWICE DAILY. RINSE MOUTH AFTER USE. 180 each 0 09/17/2019 at Unknown time  . XOLAIR 150 MG/ML prefilled syringe INJECT 300MG SUBCUTANEOUSLY EVERY 4 WEEKS (GIVEN AT MD  OFFICE)  4 mL 5 Past Month at Unknown time  . Respiratory Therapy Supplies (FLUTTER) DEVI USE AS DIRECTED (Patient not taking: Reported on 09/18/2019) 1 each 0 Not Taking at Unknown time   Scheduled: . acetaminophen  650 mg Oral Q6H   Or  . acetaminophen  650 mg Rectal Q6H  . bisoprolol  7.5 mg Oral BID  . Chlorhexidine Gluconate Cloth  6 each Topical Daily  . cloNIDine  0.1 mg Oral BID  . flecainide  75 mg Oral BID  . guaiFENesin  1,200 mg Oral BID  . insulin aspart  0-20 Units Subcutaneous TID WC  . insulin aspart  0-5 Units Subcutaneous QHS  . insulin aspart  5 Units Subcutaneous TID WC  . ipratropium-albuterol  3 mL Nebulization Q4H  . levothyroxine  137 mcg Oral QAC breakfast  . mouth rinse  15 mL Mouth Rinse q12n4p  . mometasone-formoterol  2 puff Inhalation BID  . montelukast   10 mg Oral QHS  . multivitamin with minerals  1 tablet Oral Daily  . pantoprazole  40 mg Oral BID AC  . rivaroxaban  20 mg Oral Q supper  . voriconazole  200 mg Oral BID   Continuous: . sodium chloride 50 mL/hr at 09/19/19 1715  . piperacillin-tazobactam (ZOSYN)  IV 3.375 g (09/20/19 0631)   XJO:ITGPQDIYM, haloperidol lactate, LORazepam, ondansetron **OR** ondansetron (ZOFRAN) IV, traZODone  Assesment: He was admitted with severe sepsis.  This is presumably from his community-acquired pneumonia which is looking better by chest x-ray.  He has resolved sepsis pathophysiology  He has acute on chronic hypoxic respiratory failure and oxygenation is doing better  He has community-acquired pneumonia and that is being treated and actually looks a little better on chest x-ray from yesterday.  At baseline he has severe persistent asthma and is on multiple treatments for that.  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  He has hypertension which is well controlled  He is being treated for aspergillosis.  He has sleep apnea but does not tolerate CPAP  He had metabolic encephalopathy and feels like he is almost back to baseline mental status now.   Principal Problem:   Acute on chronic respiratory failure (HCC) Active Problems:   Essential hypertension, benign   Premature ventricular contractions   PPM-St.Jude   PVC's (premature ventricular contractions)   Sinoatrial node dysfunction (HCC)   Persistent atrial fibrillation   Hyperthyroidism   Hyperlipidemia   RBBB (right bundle branch block)   Carotid artery stenosis   Type 2 diabetes mellitus (HCC)   OSA (obstructive sleep apnea)   Severe persistent asthma   CAP (community acquired pneumonia)   Chronic bronchitis (HCC)   Severe sepsis (HCC)   Elevated lactic acid level   GERD (gastroesophageal reflux disease)    Plan: Continue treatments.  He  seems to be improving fairly rapidly    LOS: 2 days   Alonza Bogus 09/20/2019, 7:57 AM

## 2019-09-21 DIAGNOSIS — J181 Lobar pneumonia, unspecified organism: Secondary | ICD-10-CM

## 2019-09-21 DIAGNOSIS — J9601 Acute respiratory failure with hypoxia: Secondary | ICD-10-CM

## 2019-09-21 DIAGNOSIS — A409 Streptococcal sepsis, unspecified: Secondary | ICD-10-CM

## 2019-09-21 DIAGNOSIS — I4891 Unspecified atrial fibrillation: Secondary | ICD-10-CM

## 2019-09-21 DIAGNOSIS — I495 Sick sinus syndrome: Secondary | ICD-10-CM

## 2019-09-21 DIAGNOSIS — Z950 Presence of cardiac pacemaker: Secondary | ICD-10-CM

## 2019-09-21 LAB — CBC WITH DIFFERENTIAL/PLATELET
Abs Immature Granulocytes: 0.02 10*3/uL (ref 0.00–0.07)
Basophils Absolute: 0 10*3/uL (ref 0.0–0.1)
Basophils Relative: 0 %
Eosinophils Absolute: 0.1 10*3/uL (ref 0.0–0.5)
Eosinophils Relative: 2 %
HCT: 49.7 % (ref 39.0–52.0)
Hemoglobin: 16.1 g/dL (ref 13.0–17.0)
Immature Granulocytes: 0 %
Lymphocytes Relative: 26 %
Lymphs Abs: 1.4 10*3/uL (ref 0.7–4.0)
MCH: 31.3 pg (ref 26.0–34.0)
MCHC: 32.4 g/dL (ref 30.0–36.0)
MCV: 96.5 fL (ref 80.0–100.0)
Monocytes Absolute: 0.5 10*3/uL (ref 0.1–1.0)
Monocytes Relative: 10 %
Neutro Abs: 3.4 10*3/uL (ref 1.7–7.7)
Neutrophils Relative %: 62 %
Platelets: 162 10*3/uL (ref 150–400)
RBC: 5.15 MIL/uL (ref 4.22–5.81)
RDW: 13.2 % (ref 11.5–15.5)
WBC: 5.4 10*3/uL (ref 4.0–10.5)
nRBC: 0 % (ref 0.0–0.2)

## 2019-09-21 LAB — PROCALCITONIN: Procalcitonin: <0.1 ng/mL

## 2019-09-21 LAB — COMPREHENSIVE METABOLIC PANEL
ALT: 24 U/L (ref 0–44)
AST: 20 U/L (ref 15–41)
Albumin: 2.9 g/dL — ABNORMAL LOW (ref 3.5–5.0)
Alkaline Phosphatase: 53 U/L (ref 38–126)
Anion gap: 6 (ref 5–15)
BUN: 20 mg/dL (ref 8–23)
CO2: 25 mmol/L (ref 22–32)
Calcium: 8.1 mg/dL — ABNORMAL LOW (ref 8.9–10.3)
Chloride: 109 mmol/L (ref 98–111)
Creatinine, Ser: 0.67 mg/dL (ref 0.61–1.24)
GFR calc Af Amer: >60 mL/min (ref >60)
GFR calc non Af Amer: >60 mL/min (ref >60)
Glucose, Bld: 160 mg/dL — ABNORMAL HIGH (ref 70–99)
Potassium: 4 mmol/L (ref 3.5–5.1)
Sodium: 140 mmol/L (ref 135–145)
Total Bilirubin: 0.7 mg/dL (ref 0.3–1.2)
Total Protein: 6 g/dL — ABNORMAL LOW (ref 6.5–8.1)

## 2019-09-21 LAB — GLUCOSE, CAPILLARY
Glucose-Capillary: 154 mg/dL — ABNORMAL HIGH (ref 70–99)
Glucose-Capillary: 154 mg/dL — ABNORMAL HIGH (ref 70–99)
Glucose-Capillary: 220 mg/dL — ABNORMAL HIGH (ref 70–99)
Glucose-Capillary: 119 mg/dL — ABNORMAL HIGH (ref 70–99)
Glucose-Capillary: 122 mg/dL — ABNORMAL HIGH (ref 70–99)

## 2019-09-21 LAB — MAGNESIUM: Magnesium: 2.3 mg/dL (ref 1.7–2.4)

## 2019-09-21 MED ORDER — FLECAINIDE ACETATE 50 MG PO TABS
200.0000 mg | ORAL_TABLET | Freq: Once | ORAL | Status: AC
  Administered 2019-09-21: 200 mg via ORAL
  Filled 2019-09-21: qty 2

## 2019-09-21 NOTE — Progress Notes (Signed)
PROGRESS NOTE  Clarence Dawson [id number] DOB: December 29, 1946 DOA: 09/18/2019 PCP: [name], MD  Brief History:  73 year old male with a history of COPD, allergic bronchopulmonary aspergillosis, severe persistent asthma, paroxysmal atrial fibrillation, sinus node dysfunction status post PPM, hypothyroidism, hyperlipidemia presented with fevers and confusion.  In emergency department, the patient was noted to have temperature of 101.8 F with lactic acid 4.2.  Patient had oxygen saturation 85% on room air.  Chest x-ray showed left basilar opacity/infiltrate.  Patient was started on intravenous Zosyn and vancomycin initially.  Pulmonary medicine was consulted to assist with management. Assessment/Plan: Acute respiratory failure with hypoxia -Secondary to pneumonia -presently stable on 4L  Severe sepsis -Present on admission -Presented with fever up to 101.8 F with lactic acid 4.2 -Continue IV Zosyn -09/18/2019 blood cultures negative -Urinalysis negative for pyuria -Personally reviewed chest x-ray--scattered opacities bilateral  Lobar pneumonia -Continue Zosyn -Check procalcitonin  Paroxysmal atrial fibrillation with RVR -continue flecainide -continue bisoprolol -BP soft with elevated HR -consult cardiology -Continue rivaroxaban  Essential Hypertension -d/c clonidine for now to allow for margin for BP -continue bisoprolol  Diabetes mellitus type 2 -Continue NovoLog sliding scale -Continue NovoLog 5 units with meals -09/18/19 A1C--7.2  Hypothyroidism -Continue Synthroid  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   Objective: Vitals:   09/21/19 0800 09/21/19 0900 09/21/19 1028 09/21/19 1029  BP: 125/72  101/74 101/74  Pulse: (!) 118   (!) 108  Resp: 15   16  Temp:  98.3 F (36.8 C)    TempSrc:      SpO2: 92%   92%  Weight:      Height:        Intake/Output Summary (Last 24 hours) at 09/21/2019 1043 Last data filed at 09/21/2019 0500 Gross per 24 hour  Intake 583.6 ml  Output 1675 ml  Net -1091.4 ml   Weight change:  Exam:   General:  Pt is alert, follows commands appropriately, not in acute distress  HEENT: No icterus, No thrush, No neck mass, [place]/AT  Cardiovascular: IRRR, S1/S2, no rubs, no gallops  Respiratory: bilateral scattered rales, no wheeze  Abdomen: Soft/+BS, non tender, non distended, no guarding  Extremities: trace LE edema, No lymphangitis, No petechiae, No rashes, no synovitis   Data Reviewed: I have personally reviewed following labs and imaging studies Basic Metabolic Panel: Recent Labs  Lab 09/18/19 0747 09/19/19 0455 09/20/19 0411 09/21/19 0633  NA 139 138 140 140  K 4.3 4.2 4.6 4.0  CL 99 108 109 109  CO2 26 20* 25 25  GLUCOSE 134* 150* 161* 160*  BUN 19 27* 34* 20  CREATININE 1.16 0.84 0.96 0.67  CALCIUM 9.1 8.0* 8.2* 8.1*  MG  --  2.2 2.4 2.3   Liver Function Tests: Recent Labs  Lab 09/18/19 0747 09/19/19 0455 09/20/19 0411 09/21/19 0633  AST _0 ALT _1 ALKPHOS 102 72 58 53  BILITOT 1.2 1.0  0.6 0.7  PROT 7.5 6.1* 6.1* 6.0*  ALBUMIN 4.0 2.9* 2.8* 2.9*   No results for input(s): LIPASE, AMYLASE in the last 168 hours. No results for input(s): AMMONIA in the last 168 hours. Coagulation Profile: Recent Labs  Lab 09/18/19 0747  INR 1.5*   CBC: Recent Labs  Lab 09/18/19 0747 09/19/19 0455 09/20/19 0411 09/21/19 0633  WBC 7.6 8.0 7.1 5.4   NEUTROABS 4.3 6.9 5.2 3.4  HGB 17.9* 16.2 15.2 16.1  HCT 56.3* 49.8 47.7 49.7  MCV 96.2 95.2 96.4 96.5  PLT 195 179 183 162   Cardiac Enzymes: No results for input(s): CKTOTAL, CKMB, CKMBINDEX, TROPONINI in the last 168 hours. BNP: Invalid input(s): POCBNP CBG: Recent Labs  Lab 09/20/19 1128 09/20/19 1626 09/20/19 2107 09/21/19 0338 09/21/19 0804  GLUCAP 214* 156* 123* 154* 154*   HbA1C: No results for input(s): HGBA1C in the last 72 hours. Urine analysis:    Component Value Date/Time   COLORURINE YELLOW 09/18/2019 [place] 09/18/2019 0747   LABSPEC 1.021 09/18/2019 0747   PHURINE 6.0 09/18/2019 0747   GLUCOSEU >=500 (A) 09/18/2019 0747   HGBUR NEGATIVE 09/18/2019 0747   BILIRUBINUR NEGATIVE 09/18/2019 0747   KETONESUR 5 (A) 09/18/2019 0747   PROTEINUR 30 (A) 09/18/2019 0747   UROBILINOGEN 1.0 11/15/2012 1051   NITRITE NEGATIVE 09/18/2019 0747   LEUKOCYTESUR NEGATIVE 09/18/2019 0747   Sepsis Labs: _0 (procalcitonin:4,lacticidven:4) ) Recent Results (from the past 240 hour(s))  Aspergillus Ag, BAL/Serum     Status: None   Collection Time: 09/12/19  9:29 AM  Result Value Ref Range Status   Aspergillus Ag, BAL/Serum 0.04 0.00 - 0.49 Index Final    Comment: (NOTE) Performed At: [facility] [address] [place], [place] [id number] Rush Farmer MD [id number]:[id number] Performed At: [facility] [address] [place], [place] [id number] Katina Degree MDPhD XB:[id number]   Culture, blood (Routine x 2)     Status: None (Preliminary result)   Collection Time: 09/18/19  7:47 AM   Specimen: BLOOD RIGHT HAND  Result Value Ref Range Status   Specimen Description BLOOD RIGHT HAND  Final   Special Requests   Final    BOTTLES DRAWN AEROBIC AND ANAEROBIC Blood Culture results may not be optimal due to an inadequate volume of blood received in culture bottles   Culture   Final    NO GROWTH 3 DAYS Performed at [facility], [address] [place], [place] [address]    Report Status PENDING  Incomplete  Culture, blood (Routine x 2)     Status: None (Preliminary result)   Collection Time: 09/18/19  7:52 AM   Specimen: Right Antecubital; Blood  Result Value Ref Range Status   Specimen Description RIGHT ANTECUBITAL  Final   Special Requests   Final    Blood Culture results may not be optimal due to an inadequate volume of blood received in culture bottles BOTTLES DRAWN AEROBIC AND ANAEROBIC   Culture   Final    NO GROWTH 3 DAYS Performed at [facility], [address], [place], [place] [address]    Report Status PENDING  Incomplete  Urine culture     Status: None   Collection Time: 09/18/19  7:59 AM   Specimen: In/Out Cath Urine  Result Value Ref Range Status   Specimen Description   Final    IN/OUT CATH URINE Performed at [facility], [address], [place],  [address]    Special Requests  Final    NONE Performed at [facility], [address], [place], [place] [address]    Culture   Final    NO GROWTH Performed at [facility], [place] [address], [place], [place] [address]    Report Status 09/19/2019 FINAL  Final  SARS Coronavirus 2 [facility] order, Performed in [place] [facility] hospital lab) Nasopharyngeal Nasopharyngeal Swab     Status: None   Collection Time: 09/18/19  8:19 AM   Specimen: Nasopharyngeal Swab  Result Value Ref Range Status   SARS Coronavirus 2 NEGATIVE NEGATIVE Final    Comment: (NOTE) If result is NEGATIVE SARS-CoV-2 target nucleic acids are NOT DETECTED. The SARS-CoV-2 RNA is generally detectable in upper and lower  respiratory specimens during the acute phase of infection. The lowest  concentration of SARS-CoV-2 viral copies this assay can detect is 250  copies / mL. A negative result does not preclude SARS-CoV-2 infection  and should not be used as the sole basis for treatment or other  patient management decisions.  A negative result may occur with  improper specimen collection  / handling, submission of specimen other  than nasopharyngeal swab, presence of viral mutation(s) within the  areas targeted by this assay, and inadequate number of viral copies  (<250 copies / mL). A negative result must be combined with clinical  observations, patient history, and epidemiological information. If result is POSITIVE SARS-CoV-2 target nucleic acids are DETECTED. The SARS-CoV-2 RNA is generally detectable in upper and lower  respiratory specimens dur ing the acute phase of infection.  Positive  results are indicative of active infection with SARS-CoV-2.  Clinical  correlation with patient history and other diagnostic information is  necessary to determine patient infection status.  Positive results do  not rule out bacterial infection or co-infection with other viruses. If result is PRESUMPTIVE POSTIVE SARS-CoV-2 nucleic acids MAY BE PRESENT.   A presumptive positive result was obtained on the submitted specimen  and confirmed on repeat testing.  While 2019 novel coronavirus  (SARS-CoV-2) nucleic acids may be present in the submitted sample  additional confirmatory testing may be necessary for epidemiological  and / or clinical management purposes  to differentiate between  SARS-CoV-2 and other Sarbecovirus currently known to infect humans.  If clinically indicated additional testing with an alternate test  methodology [phone]) is advised. The SARS-CoV-2 RNA is generally  detectable in upper and lower respiratory sp ecimens during the acute  phase of infection. The expected result is Negative. Fact Sheet for Patients:  StrictlyIdeas.no Fact Sheet for Healthcare Providers: BankingDealers.co.za This test is not yet approved or cleared by the [place] FDA and has been authorized for detection and/or diagnosis of SARS-CoV-2 by FDA under an Emergency Use Authorization (EUA).  This EUA will remain in effect (meaning this  test can be used) for the duration of the COVID-19 declaration under Section 564(b)(1) of the Act, 21 U.S.C. section 360bbb-3(b)(1), unless the authorization is terminated or revoked sooner. Performed at [facility], [address], [place], [place] [address]   MRSA PCR Screening     Status: None   Collection Time: 09/18/19 11:43 AM   Specimen: Nasal Mucosa; Nasopharyngeal  Result Value Ref Range Status   MRSA by PCR NEGATIVE NEGATIVE Final    Comment:        The GeneXpert MRSA Assay (FDA approved for NASAL specimens only), is one component of a comprehensive MRSA colonization surveillance program. It is not intended to diagnose MRSA infection nor to  guide or monitor treatment for MRSA infections. Performed at [facility], [address] [place], [place] [address]      Scheduled Meds: . acetaminophen  650 mg Oral Q6H   Or  . acetaminophen  650 mg Rectal Q6H  . bisoprolol  7.5 mg Oral BID  . Chlorhexidine Gluconate Cloth  6 each Topical Daily  . cloNIDine  0.1 mg Oral BID  . flecainide  75 mg Oral BID  . guaiFENesin  1,200 mg Oral BID  . insulin aspart  0-20 Units Subcutaneous TID WC  . insulin aspart  0-5 Units Subcutaneous QHS  . insulin aspart  5 Units Subcutaneous TID WC  . ipratropium-albuterol  3 mL Nebulization Q4H  . levothyroxine  137 mcg Oral QAC breakfast  . mouth rinse  15 mL Mouth Rinse q12n4p  . mometasone-formoterol  2 puff Inhalation BID  . montelukast  10 mg Oral QHS  . multivitamin with minerals  1 tablet Oral Daily  . pantoprazole  40 mg Oral BID AC  . rivaroxaban  20 mg Oral Q supper  . voriconazole  200 mg Oral BID   Continuous Infusions: . sodium chloride 30 mL/hr at 09/20/19 1912  . piperacillin-tazobactam (ZOSYN)  IV 3.375 g (09/21/19 0537)    Procedures/Studies: Ct Head Wo Contrast  Result Date: 09/18/2019 CLINICAL DATA:  Confusion EXAM: CT HEAD WITHOUT CONTRAST TECHNIQUE: Contiguous axial images were obtained from the base of the skull  through the vertex without intravenous contrast. COMPARISON:  None. FINDINGS: Brain: No evidence of acute infarction, hemorrhage, hydrocephalus, extra-axial collection or mass lesion/mass effect. Vascular: No hyperdense vessel or unexpected calcification. Skull: Normal. Negative for fracture or focal lesion. Sinuses/Orbits: No acute finding. Other: None. IMPRESSION: No acute intracranial pathology. Electronically Signed   By: Eddie Candle M.D.   On: 09/18/2019 14:53   Portable Chest 1 View  Result Date: 09/19/2019 CLINICAL DATA:  Fever, cough. EXAM: PORTABLE CHEST 1 VIEW COMPARISON:  Radiograph of September 18, 2019. FINDINGS: Stable cardiomediastinal silhouette. Left-sided pacemaker is unchanged in position. No pneumothorax or pleural effusion is noted. Mildly decreased right lung opacity is noted suggesting improving pneumonia. Mild left basilar atelectasis or infiltrate is noted. Bony thorax is unremarkable. IMPRESSION: Mildly decreased right lung opacity is noted suggesting improving pneumonia. Mild left basilar subsegmental atelectasis or infiltrate is noted. Electronically Signed   By: Marijo Conception M.D.   On: 09/19/2019 07:45   Dg Chest Portable 1 View  Result Date: 09/18/2019 CLINICAL DATA:  Code sepsis.  Restless.  Fever. EXAM: PORTABLE CHEST 1 VIEW COMPARISON:  07/07/2019 FINDINGS: Study mildly degraded by motion. Cardiac silhouette normal in size. No mediastinal or hilar masses. Mild hazy opacity is noted in the right mid to lower lung, which is accentuated by respiratory motion. Mild opacity at the left lung base consistent with atelectasis or scarring. Lungs otherwise clear. No convincing effusion. No pneumothorax. Left anterior chest wall sequential pacemaker is stable. IMPRESSION: 1. Hazy opacity in the right mid to lower lung. This is consistent with pneumonia proper clinical setting. No other evidence of acute cardiopulmonary disease. Electronically Signed   By: Lajean Manes M.D.   On:  09/18/2019 08:55    Orson Eva, DO  [facility] Pager [phone]  If 7PM-7AM, please contact night-coverage [email or web address] Password [email or web address] 09/21/2019, 10:43 AM   LOS: 3 days

## 2019-09-21 NOTE — Consult Note (Addendum)
Cardiology Consult    Patient ID: Clarence Dawson; [id number]; Apr 16, 1946   Admit date: 09/18/2019 Date of Consult: 09/21/2019  Primary Care Provider: [name], MD Primary Cardiologist: Cristopher Peru, MD   Patient Profile    Clarence Dawson is a 73 y.o. male with past medical history of SSS (s/p PPM placement with gen change in 2014), paroxysmal atrial fibrillation, chronic diastolic CHF, HTN, COPD, and GERD who is being seen today for the evaluation of atrial fibrillation with RVR at the request of Dr. Carles Collet.   History of Present Illness    Clarence Dawson was last examined by Dr. Lovena Le in 07/2019 and denied any recent chest pain or dyspnea at that time. Was maintaining NSR and was continued on Amlodipine 53m daily, Clonidine 0.122mBID, Bisoprolol 7.8m70mID, Losartan-HCTZ 100-28m10mily, Flecainide 78mg68m, and Xarelto.   [facility] for evaluation after his wife found him to be confused and combative at home with slurred speech. Temperature was elevated to 103 upon EMS arrival. Initial labs showed WBC 7.6, Hgb 17.9, platelets 195, Na+ 139, K+ 4.3, and creatinine 1.16.  Lactic acid was elevated to 4.2.  COVID negative.  CXR showed a hazy opacity in the right mid to lower lung concerning for pneumonia.  Head CT showed no acute intracranial abnormalities.  He was admitted for severe sepsis secondary to pneumonia and started on IV antibiotics. 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   He was initially in an A-paced rhythm with heart rate in the 80's but around 0400 this morning he went to atrial fibrillation with RVR. Rates have been variable from the 90's to 130's. He was overall asymptomatic with this and denies any change in his breathing or palpitations. Repots that his respiratory status continues to improve and he is still having a productive cough. He denies any chest pain, PND, or lower extremity  edema.  He denies missing any doses of Flecainide or Xarelto as an outpatient. His medications were initially held on admissison due to him being on BiPAP but were resumed the following morning. He has been hypotensive with SBP currently in the low-100's. PTA Amlodipine and Losartan-HCTZ held but he is still receiving Clonidine 0.1mg B77m     Past Medical History:  Diagnosis Date  . Allergic rhinitis   . Aortic valve disorder   . Asthma    since childhood- seasonal allergies induced  . 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 Carotid artery stenosis   . Essential hypertension   . Full dentures   . GERD (gastroesophageal reflux disease)   . H/O hiatal hernia   . Hemorrhage of rectum   . Hyperlipidemia   . Hypothyroidism   . 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 OSA (obstructive sleep apnea)   . Osteoarthritis   . Pacemaker    [place]    "[place] Times" 2015 last time  . Primary localized osteoarthritis of right knee 08/11/2017  . RBBB (right bundle branch block)   . Sinoatrial node dysfunction (HCC)   . Syncope   . Tricuspid valve disorder   . Type 2 diabetes mellitus (HCC)  [place]    Past Surgical History:  Procedure Laterality Date  . A-V CARDIAC PACEMAKER INSERTION     Sick sinus syndrome DDR pacer  . Arthropathy Right  2005   Rebuilding of left thumb and joint   . CARDIAC CATHETERIZATION    . CARDIAC ELECTROPHYSIOLOGY STUDY AND ABLATION  09/2008   for pvcs, Dr. Loralie Champagne  . CARDIOVERSION N/A 12/18/2016   Procedure: CARDIOVERSION;  Surgeon: Pixie Casino, MD;  Location: [facility] ENDOSCOPY;  Service: Cardiovascular;  Laterality: N/A;  . [place]   right wrist  . CARPAL TUNNEL RELEASE  05/04/2012   Procedure: CARPAL TUNNEL RELEASE;  Surgeon: Wynonia Sours, MD;  Location: [place];  Service: Orthopedics;  Laterality: Left;  . CARPOMETACARPEL SUSPENSION PLASTY Right  11/16/2014   Procedure: SUSPENSIONPLASTY RIGHT THUMB TENDON TRANSFER ABDUCTOR POLLICUS LONGUS EXCISION TRAPEZIUM;  Surgeon: Daryll Brod, MD;  Location: [place];  Service: Orthopedics;  Laterality: Right;  . CHOLECYSTECTOMY  1994  . CIRCUMCISION    . COLONOSCOPY N/A 03/14/2013   Procedure: COLONOSCOPY;  Surgeon: Daneil Dolin, MD;  [facility];  Service: Endoscopy;  Laterality: N/A;  8:15 AM  . EYE SURGERY     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-[facility]  . EYE SURGERY  2012   Left eye Corneal transplant- partial- Cataract  . FLEXIBLE BRONCHOSCOPY Right 06/23/2019   Procedure: FLEXIBLE BRONCHOSCOPY RIGHT;  Surgeon: [name], MD;  Location: [facility] ORS;  Service: Pulmonary;  Laterality: Right;  . GALLBLADDER SURGERY  12/01/2006  . HAND TENDON SURGERY Left late 1990's   thumb  . HEMORROIDECTOMY  2003  . INJECTION KNEE Left 08/26/2017   Procedure: LEFT KNEE INJECTION;  Surgeon: Elsie Saas, MD;  Location: [place];  Service: Orthopedics;  Laterality: Left;  . left Knee Arthroscopy     [facility]  . PARTIAL KNEE ARTHROPLASTY  11/22/2012   Procedure: UNICOMPARTMENTAL KNEE;  Surgeon: Lorn Junes, MD;  Location: [place];  Service: Orthopedics;  Laterality: Left;  left unicompartmental knee arthroplasty  . PERMANENT PACEMAKER GENERATOR CHANGE N/A 01/12/2013   Procedure: PERMANENT PACEMAKER GENERATOR CHANGE;  Surgeon: Evans Lance, MD;  Location: [facility] CATH LAB;  Service: Cardiovascular;  Laterality: N/A;  . Rotator cuff Surgery  2001   Right shoulder  . TONSILLECTOMY    . TOTAL KNEE ARTHROPLASTY Right 08/26/2017   Procedure: TOTAL KNEE ARTHROPLASTY;  Surgeon: Elsie Saas, MD;  Location: [place];  Service: Orthopedics;  Laterality: Right;  . varicose vein reduction       Home Medications:  Prior to Admission medications   Medication Sig Start Date End Date Taking? Authorizing Provider  acetaminophen (TYLENOL) 500 MG tablet Take 1,000 mg  by mouth 3 (three) times daily.   Yes [provider]  Aclidinium Bromide (TUDORZA PRESSAIR) 400 MCG/ACT AEPB 1 puff BID 08/08/19  Yes [name], MD  albuterol (ACCUNEB) 0.63 MG/3ML nebulizer solution Take 3 mLs by nebulization every 4 (four) hours as needed for shortness of breath. 02/16/19  Yes [provider]  albuterol (PROVENTIL HFA;VENTOLIN HFA) 108 (90 Base) MCG/ACT inhaler Inhale 2 puffs every 4 (four) hours as needed into the lungs for shortness of breath (only if you can't catch your breath).   Yes [provider]  amLODipine (NORVASC) 5 MG tablet Take 1 tablet (5 mg total) by mouth daily. 12/08/18  Yes Evans Lance, MD  bisoprolol (ZEBETA) 5 MG tablet TAKE 1 AND 1/2 TABLET BY MOUTH TWICE DAILY. 04/17/17  Yes Sherran Needs, NP  canagliflozin (INVOKANA) 300 MG TABS tablet Take 300 mg by mouth daily before breakfast.   Yes [provider]  Carboxymethylcellul-Glycerin (LUBRICATING EYE DROPS OP) Place 1 drop into the right eye daily as needed (dry eyes).   Yes [provider]  cloNIDine (CATAPRES) 0.1 MG tablet Take 0.1 mg by mouth 2 (two) times daily.   Yes [provider]  diphenhydramine-acetaminophen (TYLENOL PM) 25-500 MG TABS tablet Take 1 tablet by mouth at bedtime.   Yes [provider]  EPINEPHrine 0.3 mg/0.3 mL IJ SOAJ injection Inject 0.3 mg into the muscle as needed for anaphylaxis.  06/07/18  Yes [provider]  flecainide (TAMBOCOR) 150 MG tablet Take 0.5 tablets (75 mg total) by mouth 2 (two) times daily. 12/08/18  Yes Evans Lance, MD  glipiZIDE (GLUCOTROL XL) 5 MG 24 hr tablet Take 5 mg by mouth daily before breakfast.   Yes [provider]  guaiFENesin (MUCINEX) 600 MG 12 hr tablet Take 600 mg by mouth 2 (two) times daily.    Yes [provider]  hydrocortisone cream 1 % Apply 1 application topically daily as needed for itching.   Yes [provider]   levothyroxine (SYNTHROID, LEVOTHROID) 137 MCG tablet Take 137 mcg by mouth daily before breakfast.  06/04/16  Yes [provider]  losartan-hydrochlorothiazide (HYZAAR) 100-25 MG per tablet Take 1 tablet by mouth daily.   Yes [provider]  metFORMIN (GLUCOPHAGE-XR) 500 MG 24 hr tablet Take 1,000 mg by mouth at bedtime.    Yes [provider]  montelukast (SINGULAIR) 10 MG tablet TAKE (1) TABLET BY MOUTH AT BEDTIME. 10/25/18  Yes [name], MD  Multiple Vitamin (MULTIVITAMIN) tablet Take 1 tablet by mouth daily.     Yes [provider]  omeprazole (PRILOSEC OTC) 20 MG tablet Take 20 mg by mouth daily.    Yes [provider]  rivaroxaban (XARELTO) 20 MG TABS tablet Take 20 mg daily with supper by mouth.   Yes [provider]  voriconazole (VFEND) 200 MG tablet Take 1 tablet (200 mg total) by mouth 2 (two) times daily. 08/08/19 11/06/19 Yes [name], MD  WIXELA INHUB 500-50 MCG/DOSE AEPB INHALE 1 PUFF INTO THE LUNGS TWICE DAILY. RINSE MOUTH AFTER USE. 05/10/19  Yes [name], MD  Arvid Right 150 MG/ML prefilled syringe INJECT 300MG SUBCUTANEOUSLY EVERY 4 WEEKS (GIVEN AT MD  OFFICE) 05/16/19  Yes [name], MD  Respiratory Therapy Supplies (FLUTTER) [place] USE AS DIRECTED Patient not taking: Reported on 09/18/2019 02/09/19   Martyn Ehrich, NP    Inpatient Medications: Scheduled Meds: . acetaminophen  650 mg Oral Q6H   Or  . acetaminophen  650 mg Rectal Q6H  . bisoprolol  7.5 mg Oral BID  . Chlorhexidine Gluconate Cloth  6 each Topical Daily  . cloNIDine  0.1 mg Oral BID  . flecainide  75 mg Oral BID  . guaiFENesin  1,200 mg Oral BID  . insulin aspart  0-20 Units Subcutaneous TID WC  . insulin aspart  0-5 Units Subcutaneous QHS  . insulin aspart  5 Units Subcutaneous TID WC  . ipratropium-albuterol  3 mL Nebulization Q4H  . levothyroxine  137 mcg Oral QAC breakfast  . mouth rinse  15 mL Mouth  Rinse q12n4p  . mometasone-formoterol  2 puff Inhalation BID  . montelukast  10 mg Oral QHS  . multivitamin with minerals  1 tablet Oral Daily  . pantoprazole  40 mg Oral BID AC  . rivaroxaban  20 mg Oral Q supper  . voriconazole  200 mg Oral BID   Continuous Infusions: .  sodium chloride 30 mL/hr at 09/20/19 1912  . piperacillin-tazobactam (ZOSYN)  IV 3.375 g (09/21/19 0537)   PRN Meds: bisacodyl, haloperidol lactate, LORazepam, ondansetron **OR** ondansetron (ZOFRAN) IV, traZODone  Allergies:    Allergies  Allergen Reactions  . Food Anaphylaxis and Shortness Of Breath    TREE NUTS  . Iodinated Diagnostic Agents Hives and Shortness Of Breath    Patient states hives to throat closing. (01/15/17: patient states this reaction was "about 20 years ago" with possibly an IVP.  He now says high doses of prednisone "throw me into AFib."  He has tolerated CT arthrograms with Benadrly 33m PO one hour before injection.  JBrita Romp RN)  . Shellfish Allergy Anaphylaxis and Shortness Of Breath    To shellfish, crabs.  Makes him feel like "things are crawling all over" me.  Denies airway issues with these foods.  (Brita Romp RN 01/15/17)  . Goat-Derived PChild psychotherapist  . Prednisone Palpitations    PRECIPITATES A-FIB  . Metformin And Related Diarrhea    High doses at once  . Voltaren [Diclofenac Sodium] Other (See Comments)    Feels like things are crawling on him    Social History:   Social History   Socioeconomic History  . Marital status: Married    Spouse name: Not on file  . Number of children: Not on file  . Years of education: Not on file  . Highest education level: Not on file  Occupational History  . Not on file  Social Needs  . Financial resource strain: Not on file  . Food insecurity    Worry: Not on file    Inability: Not on file  . Transportation needs    Medical: Not on file    Non-medical: Not on file  Tobacco Use  . Smoking status: Former  Smoker    Packs/day: 1.00    Years: 25.00    Pack years: 25.00    Types: Cigarettes    Start date: 02/20/1958    Quit date: 02/12/1984    Years since quitting: 35.6  . Smokeless tobacco: Former [employer] developer   Types: Chew  Substance and Sexual Activity  . Alcohol use: Yes    Alcohol/week: 4.0 standard drinks    Types: 4 Glasses of wine per week  . Drug use: No  . Sexual activity: Not on file  Lifestyle  . Physical activity    Days per week: Not on file    Minutes per session: Not on file  . Stress: Not on file  Relationships  . Social c[place]baliston phone: Not on file    Gets together: Not on file    Attends religious service: Not on file    Active member of club or organization: Not on file    Attends meetings of clubs or organizations: Not on file    Relationship status: Not on file  . Intimate partner violence    Fear of current or ex partner: Not on file    Emotionally abused: Not on file    Physically abused: Not on file    Forced sexual activity: Not on file  Other Topics Concern  . Not on file  Social History Narrative   Regular exercise: No     Family History:    Family History  Problem Relation Age of Onset  . Other Father 629      Sudden Cardiac death  . Pancreatic cancer Mother   .  Colon cancer Mother   . Colon cancer Maternal Aunt   . Colon polyps Neg Hx       Review of Systems    General:  No chills, fever, night sweats or weight changes.  Cardiovascular:  No chest pain, edema, orthopnea, palpitations, paroxysmal nocturnal dyspnea. Positive for dyspnea on exertion.  Dermatological: No rash, lesions/masses Respiratory: Positive for cough and dyspnea. Urologic: No hematuria, dysuria Abdominal:   No nausea, vomiting, diarrhea, bright red blood per rectum, melena, or hematemesis Neurologic:  No visual changes, wkns, changes in mental status. All other systems reviewed and are otherwise negative except as noted above.  Physical Exam/Data     Vitals:   09/21/19 0800 09/21/19 0900 09/21/19 1028 09/21/19 1029  BP: 125/72  101/74 101/74  Pulse: (!) 118   (!) 108  Resp: 15   16  Temp:  98.3 F (36.8 C)    TempSrc:      SpO2: 92%   92%  Weight:      Height:        Intake/Output Summary (Last 24 hours) at 09/21/2019 1046 Last data filed at 09/21/2019 0500 Gross per 24 hour  Intake 583.6 ml  Output 1675 ml  Net -1091.4 ml   Filed Weights   09/18/19 1130 09/19/19 0500 09/20/19 0500  Weight: 108.9 kg 106.8 kg 106 kg   Body mass index is 35.53 kg/m.   General: Pleasant Caucasian male appearing in NAD Psych: Normal affect. Neuro: Alert and oriented X 3. Moves all extremities spontaneously. HEENT: Normal  Neck: Supple without bruits or JVD. Lungs:  Resp regular and unlabored, rhonchi throughout upper lung fields. On 6L [place].  Heart: Irregularly irregular. Tachycardiac. No s3, s4, or murmurs. Abdomen: Soft, non-tender, non-distended, BS + x 4.  Extremities: No clubbing, cyanosis or edema. DP/PT/Radials 2+ and equal bilaterally.   EKG:  The EKG was personally reviewed and demonstrates: Sinus rhythm, HR 89, with known RBBB. Possible pacer spikes in precordial leads but difficult to distinguish given significant artifact.     Labs/Studies     Relevant CV Studies:  Echocardiogram: 09/2016 Study Conclusions  - Left ventricle: The cavity size was normal. Wall thickness was   increased in a pattern of mild LVH. Systolic function was normal.   The estimated ejection fraction was in the range of 60% to 65%.   Wall motion was normal; there were no regional wall motion   abnormalities. Features are consistent with a pseudonormal left   ventricular filling pattern, with concomitant abnormal relaxation   and increased filling pressure (grade 2 diastolic dysfunction). - Aortic valve: Mildly to moderately calcified annulus. Moderately   calcified leaflets. There was mild to moderate stenosis. Valve   area (VTI): 1.3 cm^2. -  Mitral valve: Calcified annulus. There was trivial regurgitation. - Left atrium: The atrium was mildly to moderately dilated. - Right ventricle: Pacer wire or catheter noted in right ventricle. - Right atrium: Central venous pressure (est): 3 mm Hg. - Tricuspid valve: There was trivial regurgitation. - Pulmonary arteries: PA peak pressure: 48 mm Hg (S). - Pericardium, extracardiac: A prominent pericardial fat pad was   present.  Impressions:  - Mild LVH with LVEF 60-65%. Grade 2 diastolic dysfunction. Mild to   moderate left atrial enlargement. Mildly calcified mitral annulus   with trivial mitral regurgitation. Moderately calcified aortic   valve, leaflet structure difficult to identify. There is evidence   of mild to moderate aortic stenosis. Device wire present in the   right  heart. Trivial tricuspid regurgitation with PASP 48 mmHg.   Laboratory Data:  Chemistry Recent Labs  Lab 09/19/19 0455 09/20/19 0411 09/21/19 0633  NA 138 140 140  K 4.2 4.6 4.0  CL 108 109 109  CO2 20* 25 25  GLUCOSE 150* 161* 160*  BUN 27* 34* 20  CREATININE 0.84 0.96 0.67  CALCIUM 8.0* 8.2* 8.1*  GFRNONAA >60 >60 >60  GFRAA >60 >60 >60  ANIONGAP _0 Recent Labs  Lab 09/19/19 0455 09/20/19 0411 09/21/19 0633  PROT 6.1* 6.1* 6.0*  ALBUMIN 2.9* 2.8* 2.9*  AST _1 ALT _2 ALKPHOS 72 58 53  BILITOT 1.0 0.6 0.7   Hematology Recent Labs  Lab 09/19/19 0455 09/20/19 0411 09/21/19 0633  WBC 8.0 7.1 5.4  RBC 5.23 4.95 5.15  HGB 16.2 15.2 16.1  HCT 49.8 47.7 49.7  MCV 95.2 96.4 96.5  MCH 31.0 30.7 31.3  MCHC 32.5 31.9 32.4  RDW 12.7 13.2 13.2  PLT 179 183 162   Cardiac EnzymesNo results for input(s): TROPONINI in the last 168 hours. No results for input(s): TROPIPOC in the last 168 hours.  BNPNo results for input(s): BNP, PROBNP in the last 168 hours.  DDimer No results for input(s): DDIMER in the last 168 hours.  Radiology/Studies:  Ct Head Wo  Contrast  Result Date: 09/18/2019 CLINICAL DATA:  Confusion EXAM: CT HEAD WITHOUT CONTRAST TECHNIQUE: Contiguous axial images were obtained from the base of the skull through the vertex without intravenous contrast. COMPARISON:  None. FINDINGS: Brain: No evidence of acute infarction, hemorrhage, hydrocephalus, extra-axial collection or mass lesion/mass effect. Vascular: No hyperdense vessel or unexpected calcification. Skull: Normal. Negative for fracture or focal lesion. Sinuses/Orbits: No acute finding. Other: None. IMPRESSION: No acute intracranial pathology. Electronically Signed   By: Eddie Candle M.D.   On: 09/18/2019 14:53   Portable Chest 1 View  Result Date: 09/19/2019 CLINICAL DATA:  Fever, cough. EXAM: PORTABLE CHEST 1 VIEW COMPARISON:  Radiograph of September 18, 2019. FINDINGS: Stable cardiomediastinal silhouette. Left-sided pacemaker is unchanged in position. No pneumothorax or pleural effusion is noted. Mildly decreased right lung opacity is noted suggesting improving pneumonia. Mild left basilar atelectasis or infiltrate is noted. Bony thorax is unremarkable. IMPRESSION: Mildly decreased right lung opacity is noted suggesting improving pneumonia. Mild left basilar subsegmental atelectasis or infiltrate is noted. Electronically Signed   By: Marijo Conception M.D.   On: 09/19/2019 07:45   Dg Chest Portable 1 View  Result Date: 09/18/2019 CLINICAL DATA:  Code sepsis.  Restless.  Fever. EXAM: PORTABLE CHEST 1 VIEW COMPARISON:  07/07/2019 FINDINGS: Study mildly degraded by motion. Cardiac silhouette normal in size. No mediastinal or hilar masses. Mild hazy opacity is noted in the right mid to lower lung, which is accentuated by respiratory motion. Mild opacity at the left lung base consistent with atelectasis or scarring. Lungs otherwise clear. No convincing effusion. No pneumothorax. Left anterior chest wall sequential pacemaker is stable. IMPRESSION: 1. Hazy opacity in the right mid to lower  lung. This is consistent with pneumonia proper clinical setting. No other evidence of acute cardiopulmonary disease. Electronically Signed   By: Lajean Manes M.D.   On: 09/18/2019 08:55     Assessment & Plan    1. Atrial Fibrillation with RVR in the setting of known Paroxysmal Atrial Fibrillation - He has a known history of atrial fibrillation but had mostly maintained normal sinus rhythm while on Flecainide until this  admission. Most recent device interrogation last month showed no episodes of atrial fibrillation and prior interrogation in 04/2019 showed the longest episode was less than 1 minute. - He was initially A-paced upon admission but went into atrial fibrillation with RVR around 0400 this morning. He did miss a dose of Flecainide on the night of admission due to being on BiPAP but was restarted on this the following morning. Reviewed with Dr. Domenic Polite and will plan to give a dose of 200 mg Flecainide given that he already received his AM dose of 21m. Continue to follow on telemetry. May eventually require repeat DCCV possibly as outpatient if he does not convert with Flecainide.  - Continue Bisoprolol but would discontinue Clonidine given hypotension. Continue Xarelto for anticoagulation.   2. SSS  - s/p PPM placement with gen change in 2014. Followed by Dr. TLovena Leas an outpatient.   3. HTN - he has been hypotensive this admission.  Continue to hold PTA Amlodipine 51mdaily and Losartan-HCTZ 100-253maily. Will also discontinue Clonidine for now.   4. Sepsis secondary to Lobar PNA - continue Zosyn. Further management per Pulmonology and admitting team.     For questions or updates, please contact [facility] consult [email or web address] for contact info under Cardiology/STEMI.  Signed, BriErma HeritageA-C 09/21/2019, 10:46 AM Pager: [phone] note:  Patient seen and examined.  I reviewed his records and discussed the case with Ms. StrAhmed Prima-C.   Mr. BanStrine a 73 41ar old male with a history of paroxysmal atrial fibrillation, sick sinus syndrome with pacemaker in place, chronic diastolic heart failure, hypertension, and COPD.  He is admitted to the hospital presenting with altered mental status and fevers with ultimate diagnosis of pneumonia and sepsis.  He was originally noted to be in an atrial paced rhythm by ECG but went into rapid atrial fibrillation subsequently.  At baseline he is on flecainide and Xarelto as an outpatient.  Blood pressures have been lower than normal and at present amlodipine and losartan HCTZ are on hold.  He was also taken off clonidine.  On examination he is sitting in a bedside chair, appears comfortable.  He does not report any sense of chest pain or palpitations.  He is currently afebrile, heart rate ranging 100-130 in atrial fibrillation, systolic blood pressure [id number] exhibit scattered rhonchi but no wheezing.  Cardiac exam reveals irregularly irregular rhythm.  Lab work shows potassium 4.0, BUN 20, creatinine 0.67, hemoglobin 16.1, platelets 162.  I personally reviewed his ECG from 09/18/2019 which showed sinus rhythm with intermittent atrial pacing, right bundle branch block.  Atrial fibrillation with RVR with known history of paroxysmal atrial fibrillation.  Noted with physiologic stress in the setting of pneumonia and sepsis.  He got typical dose of flecainide 75 mg this morning, we will give a one-time 200 mg dose today to hopefully aid cardioversion otherwise continue his standing cardiac regimen including bisoprolol and Xarelto.  SamSatira Sark.D., F.A.C.C.

## 2019-09-21 NOTE — Progress Notes (Signed)
Subjective: He was admitted with severe sepsis and pneumonia.  He has respiratory failure.  He is still on 6 L oxygen and has O2 sat in the mid 90s now.  He says he feels much better.  He had significant trouble with confusion on admission and that has resolved.  He was evaluated by speech yesterday and felt to be low risk for aspiration.  Objective: Vital signs in last 24 hours: Temp:  [97.9 F (36.6 C)-98.4 F (36.9 C)] 98 F (36.7 C) (09/23 0500) Pulse Rate:  [77-84] 78 (09/23 0000) Resp:  [13-20] 13 (09/23 0000) BP: (110-135)/(65-74) 119/72 (09/23 0000) SpO2:  [86 %-96 %] 93 % (09/23 0503) Weight change:  Last BM Date: 09/19/19  Intake/Output from previous day: 09/22 0701 - 09/23 0700 In: 850.2 [I.V.:696.8; IV Piggyback:153.3] Out: 1675 [Urine:1675]  PHYSICAL EXAM General appearance: alert, cooperative and no distress Resp: rhonchi bilaterally Cardio: Heart rate about 100.  Looks like it is mostly paced rhythm GI: soft, non-tender; bowel sounds normal; no masses,  no organomegaly Extremities: extremities normal, atraumatic, no cyanosis or edema  Lab Results:  Results for orders placed or performed during the hospital encounter of 09/18/19 (from the past 48 hour(s))  Glucose, capillary     Status: Abnormal   Collection Time: 09/19/19  7:45 AM  Result Value Ref Range   Glucose-Capillary 140 (H) 70 - 99 mg/dL  Glucose, capillary     Status: Abnormal   Collection Time: 09/19/19 11:11 AM  Result Value Ref Range   Glucose-Capillary 219 (H) 70 - 99 mg/dL  Glucose, capillary     Status: Abnormal   Collection Time: 09/19/19  4:15 PM  Result Value Ref Range   Glucose-Capillary 240 (H) 70 - 99 mg/dL  Glucose, capillary     Status: Abnormal   Collection Time: 09/19/19  9:53 PM  Result Value Ref Range   Glucose-Capillary 268 (H) 70 - 99 mg/dL   Comment 1 Notify RN    Comment 2 Document in Chart   Glucose, capillary     Status: Abnormal   Collection Time: 09/20/19  3:47 AM   Result Value Ref Range   Glucose-Capillary 158 (H) 70 - 99 mg/dL   Comment 1 Notify RN    Comment 2 Document in Chart   Comprehensive metabolic panel     Status: Abnormal   Collection Time: 09/20/19  4:11 AM  Result Value Ref Range   Sodium 140 135 - 145 mmol/L   Potassium 4.6 3.5 - 5.1 mmol/L   Chloride 109 98 - 111 mmol/L   CO2 25 22 - 32 mmol/L   Glucose, Bld 161 (H) 70 - 99 mg/dL   BUN 34 (H) 8 - 23 mg/dL   Creatinine, Ser 0.96 0.61 - 1.24 mg/dL   Calcium 8.2 (L) 8.9 - 10.3 mg/dL   Total Protein 6.1 (L) 6.5 - 8.1 g/dL   Albumin 2.8 (L) 3.5 - 5.0 g/dL   AST 18 15 - 41 U/L   ALT 21 0 - 44 U/L   Alkaline Phosphatase 58 38 - 126 U/L   Total Bilirubin 0.6 0.3 - 1.2 mg/dL   GFR calc non Af Amer >60 >60 mL/min   GFR calc Af Amer >60 >60 mL/min   Anion gap 6 5 - 15    Comment: Performed at [facility], [address], [place], [place] [address]  Magnesium     Status: None   Collection Time: 09/20/19  4:11 AM  Result Value Ref Range  Magnesium 2.4 1.7 - 2.4 mg/dL    Comment: Performed at [facility], [address] [place], [place] [address]  CBC WITH DIFFERENTIAL     Status: None   Collection Time: 09/20/19  4:11 AM  Result Value Ref Range   WBC 7.1 4.0 - 10.5 K/uL   RBC 4.95 4.22 - 5.81 MIL/uL   Hemoglobin 15.2 13.0 - 17.0 g/dL   HCT 47.7 39.0 - 52.0 %   MCV 96.4 80.0 - 100.0 fL   MCH 30.7 26.0 - 34.0 pg   MCHC 31.9 30.0 - 36.0 g/dL   RDW 13.2 11.5 - 15.5 %   Platelets 183 150 - 400 K/uL   nRBC 0.0 0.0 - 0.2 %   Neutrophils Relative % 73 %   Neutro Abs 5.2 1.7 - 7.7 K/uL   Lymphocytes Relative 18 %   Lymphs Abs 1.3 0.7 - 4.0 K/uL   Monocytes Relative 9 %   Monocytes Absolute 0.7 0.1 - 1.0 K/uL   Eosinophils Relative 0 %   Eosinophils Absolute 0.0 0.0 - 0.5 K/uL   Basophils Relative 0 %   Basophils Absolute 0.0 0.0 - 0.1 K/uL   Immature Granulocytes 0 %   Abs Immature Granulocytes 0.02 0.00 - 0.07 K/uL    Comment: Performed at [facility], [address],  [place], [place] [address]  Glucose, capillary     Status: Abnormal   Collection Time: 09/20/19  7:36 AM  Result Value Ref Range   Glucose-Capillary 153 (H) 70 - 99 mg/dL  Glucose, capillary     Status: Abnormal   Collection Time: 09/20/19 11:28 AM  Result Value Ref Range   Glucose-Capillary 214 (H) 70 - 99 mg/dL  Glucose, capillary     Status: Abnormal   Collection Time: 09/20/19  4:26 PM  Result Value Ref Range   Glucose-Capillary 156 (H) 70 - 99 mg/dL  Glucose, capillary     Status: Abnormal   Collection Time: 09/20/19  9:07 PM  Result Value Ref Range   Glucose-Capillary 123 (H) 70 - 99 mg/dL   Comment 1 Notify RN    Comment 2 Document in Chart   Glucose, capillary     Status: Abnormal   Collection Time: 09/21/19  3:38 AM  Result Value Ref Range   Glucose-Capillary 154 (H) 70 - 99 mg/dL   Comment 1 Notify RN    Comment 2 Document in Chart   Comprehensive metabolic panel     Status: Abnormal   Collection Time: 09/21/19  6:33 AM  Result Value Ref Range   Sodium 140 135 - 145 mmol/L   Potassium 4.0 3.5 - 5.1 mmol/L   Chloride 109 98 - 111 mmol/L   CO2 25 22 - 32 mmol/L   Glucose, Bld 160 (H) 70 - 99 mg/dL   BUN 20 8 - 23 mg/dL   Creatinine, Ser 0.67 0.61 - 1.24 mg/dL   Calcium 8.1 (L) 8.9 - 10.3 mg/dL   Total Protein 6.0 (L) 6.5 - 8.1 g/dL   Albumin 2.9 (L) 3.5 - 5.0 g/dL   AST 20 15 - 41 U/L   ALT 24 0 - 44 U/L   Alkaline Phosphatase 53 38 - 126 U/L   Total Bilirubin 0.7 0.3 - 1.2 mg/dL   GFR calc non Af Amer >60 >60 mL/min   GFR calc Af Amer >60 >60 mL/min   Anion gap 6 5 - 15    Comment: Performed at [facility], [address] [place],  [place] [address]  Magnesium     Status: None   Collection Time: 09/21/19  6:33 AM  Result Value Ref Range   Magnesium 2.3 1.7 - 2.4 mg/dL    Comment: Performed at [facility], [address], [place], [place] [address]  CBC WITH DIFFERENTIAL     Status: None   Collection Time: 09/21/19  6:33 AM  Result Value Ref Range   WBC 5.4 4.0  - 10.5 K/uL   RBC 5.15 4.22 - 5.81 MIL/uL   Hemoglobin 16.1 13.0 - 17.0 g/dL   HCT 49.7 39.0 - 52.0 %   MCV 96.5 80.0 - 100.0 fL   MCH 31.3 26.0 - 34.0 pg   MCHC 32.4 30.0 - 36.0 g/dL   RDW 13.2 11.5 - 15.5 %   Platelets 162 150 - 400 K/uL   nRBC 0.0 0.0 - 0.2 %   Neutrophils Relative % 62 %   Neutro Abs 3.4 1.7 - 7.7 K/uL   Lymphocytes Relative 26 %   Lymphs Abs 1.4 0.7 - 4.0 K/uL   Monocytes Relative 10 %   Monocytes Absolute 0.5 0.1 - 1.0 K/uL   Eosinophils Relative 2 %   Eosinophils Absolute 0.1 0.0 - 0.5 K/uL   Basophils Relative 0 %   Basophils Absolute 0.0 0.0 - 0.1 K/uL   Immature Granulocytes 0 %   Abs Immature Granulocytes 0.02 0.00 - 0.07 K/uL    Comment: Performed at [facility], [address], [place], [place] [address]    ABGS No results for input(s): PHART, PO2ART, TCO2, HCO3 in the last 72 hours.  Invalid input(s): PCO2 CULTURES Recent Results (from the past 240 hour(s))  Aspergillus Ag, BAL/Serum     Status: None   Collection Time: 09/12/19  9:29 AM  Result Value Ref Range Status   Aspergillus Ag, BAL/Serum 0.04 0.00 - 0.49 Index Final    Comment: (NOTE) Performed At: [facility] [address] [place], [place] [id number] Rush Farmer MD [id number] Performed At: [facility] [address] [place], [place] [id number] Katina Degree MDPhD [id number]   Culture, blood (Routine x 2)     Status: None (Preliminary result)   Collection Time: 09/18/19  7:47 AM   Specimen: BLOOD RIGHT HAND  Result Value Ref Range Status   Specimen Description BLOOD RIGHT HAND  Final   Special Requests   Final    BOTTLES DRAWN AEROBIC AND ANAEROBIC Blood Culture results may not be optimal due to an inadequate volume of blood received in culture bottles   Culture   Final    NO GROWTH 2 DAYS Performed at [facility], [address] [place], [place] [address]    Report Status PENDING  Incomplete  Culture, blood (Routine x 2)     Status: None (Preliminary  result)   Collection Time: 09/18/19  7:52 AM   Specimen: Right Antecubital; Blood  Result Value Ref Range Status   Specimen Description RIGHT ANTECUBITAL  Final   Special Requests   Final    Blood Culture results may not be optimal due to an inadequate volume of blood received in culture bottles BOTTLES DRAWN AEROBIC AND ANAEROBIC   Culture   Final    NO GROWTH 2 DAYS Performed at [facility], [address] [place], [place] [address]    Report Status PENDING  Incomplete  Urine culture     Status: None   Collection Time: 09/18/19  7:59 AM   Specimen: In/Out Cath Urine  Result Value  Ref Range Status   Specimen Description   Final    IN/OUT CATH URINE Performed at [facility], [address] [place], [place] [address]    Special Requests   Final    NONE Performed at [facility], [address], [place], [place] [address]    Culture   Final    NO GROWTH Performed at [facility], [place] [address], [place], [place] [address]    Report Status 09/19/2019 FINAL  Final  SARS Coronavirus 2 [facility] order, Performed in [facility] hospital lab) Nasopharyngeal Nasopharyngeal Swab     Status: None   Collection Time: 09/18/19  8:19 AM   Specimen: Nasopharyngeal Swab  Result Value Ref Range Status   SARS Coronavirus 2 NEGATIVE NEGATIVE Final    Comment: (NOTE) If result is NEGATIVE SARS-CoV-2 target nucleic acids are NOT DETECTED. The SARS-CoV-2 RNA is generally detectable in upper and lower  respiratory specimens during the acute phase of infection. The lowest  concentration of SARS-CoV-2 viral copies this assay can detect is 250  copies / mL. A negative result does not preclude SARS-CoV-2 infection  and should not be used as the sole basis for treatment or other  patient management decisions.  A negative result may occur with  improper specimen collection / handling, submission of specimen other  than nasopharyngeal swab, presence of viral mutation(s) within the  areas  targeted by this assay, and inadequate number of viral copies  (<250 copies / mL). A negative result must be combined with clinical  observations, patient history, and epidemiological information. If result is POSITIVE SARS-CoV-2 target nucleic acids are DETECTED. The SARS-CoV-2 RNA is generally detectable in upper and lower  respiratory specimens dur ing the acute phase of infection.  Positive  results are indicative of active infection with SARS-CoV-2.  Clinical  correlation with patient history and other diagnostic information is  necessary to determine patient infection status.  Positive results do  not rule out bacterial infection or co-infection with other viruses. If result is PRESUMPTIVE POSTIVE SARS-CoV-2 nucleic acids MAY BE PRESENT.   A presumptive positive result was obtained on the submitted specimen  and confirmed on repeat testing.  While 2019 novel coronavirus  (SARS-CoV-2) nucleic acids may be present in the submitted sample  additional confirmatory testing may be necessary for epidemiological  and / or clinical management purposes  to differentiate between  SARS-CoV-2 and other Sarbecovirus currently known to infect humans.  If clinically indicated additional testing with an alternate test  methodology [phone]) is advised. The SARS-CoV-2 RNA is generally  detectable in upper and lower respiratory sp ecimens during the acute  phase of infection. The expected result is Negative. Fact Sheet for Patients:  StrictlyIdeas.no Fact Sheet for Healthcare Providers: BankingDealers.co.za This test is not yet approved or cleared by the [place] FDA and has been authorized for detection and/or diagnosis of SARS-CoV-2 by FDA under an Emergency Use Authorization (EUA).  This EUA will remain in effect (meaning this test can be used) for the duration of the COVID-19 declaration under Section 564(b)(1) of the Act, 21 U.S.C. section  360bbb-3(b)(1), unless the authorization is terminated or revoked sooner. Performed at [facility], [address], [place], [place] [address]   MRSA PCR Screening     Status: None   Collection Time: 09/18/19 11:43 AM   Specimen: Nasal Mucosa; Nasopharyngeal  Result Value Ref Range Status   MRSA by PCR NEGATIVE NEGATIVE Final    Comment:  The GeneXpert MRSA Assay (FDA approved for NASAL specimens only), is one component of a comprehensive MRSA colonization surveillance program. It is not intended to diagnose MRSA infection nor to guide or monitor treatment for MRSA infections. Performed at [facility], [address], [place], [place] [address]    Studies/Results: No results found.  Medications:  Prior to Admission:  Facility-Administered Medications Prior to Admission  Medication Dose Route Frequency Provider Last Rate Last Dose  . omalizumab Arvid Right) injection 300 mg  300 mg Subcutaneous Q14 Days [name], MD   300 mg at 02/09/19 1519   Medications Prior to Admission  Medication Sig Dispense Refill Last Dose  . acetaminophen (TYLENOL) 500 MG tablet Take 1,000 mg by mouth 3 (three) times daily.   09/18/2019 at 0415  . Aclidinium Bromide (TUDORZA PRESSAIR) 400 MCG/ACT AEPB 1 puff BID 1 each 2 09/17/2019 at Unknown time  . albuterol (ACCUNEB) 0.63 MG/3ML nebulizer solution Take 3 mLs by nebulization every 4 (four) hours as needed for shortness of breath.     Marland Kitchen albuterol (PROVENTIL HFA;VENTOLIN HFA) 108 (90 Base) MCG/ACT inhaler Inhale 2 puffs every 4 (four) hours as needed into the lungs for shortness of breath (only if you can't catch your breath).     Marland Kitchen amLODipine (NORVASC) 5 MG tablet Take 1 tablet (5 mg total) by mouth daily. 90 tablet 3 09/17/2019 at Unknown time  . bisoprolol (ZEBETA) 5 MG tablet TAKE 1 AND 1/2 TABLET BY MOUTH TWICE DAILY. 90 tablet 3 09/17/2019 at Unknown time  . canagliflozin (INVOKANA) 300 MG TABS tablet Take 300 mg by mouth daily before  breakfast.   09/17/2019 at Unknown time  . Carboxymethylcellul-Glycerin (LUBRICATING EYE DROPS OP) Place 1 drop into the right eye daily as needed (dry eyes).   09/17/2019 at Unknown time  . cloNIDine (CATAPRES) 0.1 MG tablet Take 0.1 mg by mouth 2 (two) times daily.   09/17/2019 at Unknown time  . diphenhydramine-acetaminophen (TYLENOL PM) 25-500 MG TABS tablet Take 1 tablet by mouth at bedtime.     Marland Kitchen EPINEPHrine 0.3 mg/0.3 mL IJ SOAJ injection Inject 0.3 mg into the muscle as needed for anaphylaxis.    Past Month at Unknown time  . flecainide (TAMBOCOR) 150 MG tablet Take 0.5 tablets (75 mg total) by mouth 2 (two) times daily. 90 tablet 3 09/17/2019 at Unknown time  . glipiZIDE (GLUCOTROL XL) 5 MG 24 hr tablet Take 5 mg by mouth daily before breakfast.   09/17/2019 at Unknown time  . guaiFENesin (MUCINEX) 600 MG 12 hr tablet Take 600 mg by mouth 2 (two) times daily.    09/17/2019 at Unknown time  . hydrocortisone cream 1 % Apply 1 application topically daily as needed for itching.     . levothyroxine (SYNTHROID, LEVOTHROID) 137 MCG tablet Take 137 mcg by mouth daily before breakfast.    09/17/2019 at Unknown time  . losartan-hydrochlorothiazide (HYZAAR) 100-25 MG per tablet Take 1 tablet by mouth daily.   09/17/2019 at Unknown time  . metFORMIN (GLUCOPHAGE-XR) 500 MG 24 hr tablet Take 1,000 mg by mouth at bedtime.    09/17/2019 at Unknown time  . montelukast (SINGULAIR) 10 MG tablet TAKE (1) TABLET BY MOUTH AT BEDTIME. 30 tablet 0 09/17/2019 at Unknown time  . Multiple Vitamin (MULTIVITAMIN) tablet Take 1 tablet by mouth daily.     09/17/2019 at Unknown time  . omeprazole (PRILOSEC OTC) 20 MG tablet Take 20 mg by mouth daily.    09/17/2019 at Unknown time  .  rivaroxaban (XARELTO) 20 MG TABS tablet Take 20 mg daily with supper by mouth.   09/17/2019 at Unknown time  . voriconazole (VFEND) 200 MG tablet Take 1 tablet (200 mg total) by mouth 2 (two) times daily. 180 tablet 0 09/17/2019 at Unknown time  . WIXELA  INHUB 500-50 MCG/DOSE AEPB INHALE 1 PUFF INTO THE LUNGS TWICE DAILY. RINSE MOUTH AFTER USE. 180 each 0 09/17/2019 at Unknown time  . XOLAIR 150 MG/ML prefilled syringe INJECT 300MG SUBCUTANEOUSLY EVERY 4 WEEKS (GIVEN AT MD  OFFICE) 4 mL 5 Past Month at Unknown time  . Respiratory Therapy Supplies (FLUTTER) DEVI USE AS DIRECTED (Patient not taking: Reported on 09/18/2019) 1 each 0 Not Taking at Unknown time   Scheduled: . acetaminophen  650 mg Oral Q6H   Or  . acetaminophen  650 mg Rectal Q6H  . bisoprolol  7.5 mg Oral BID  . Chlorhexidine Gluconate Cloth  6 each Topical Daily  . cloNIDine  0.1 mg Oral BID  . flecainide  75 mg Oral BID  . guaiFENesin  1,200 mg Oral BID  . insulin aspart  0-20 Units Subcutaneous TID WC  . insulin aspart  0-5 Units Subcutaneous QHS  . insulin aspart  5 Units Subcutaneous TID WC  . ipratropium-albuterol  3 mL Nebulization Q4H  . levothyroxine  137 mcg Oral QAC breakfast  . mouth rinse  15 mL Mouth Rinse q12n4p  . mometasone-formoterol  2 puff Inhalation BID  . montelukast  10 mg Oral QHS  . multivitamin with minerals  1 tablet Oral Daily  . pantoprazole  40 mg Oral BID AC  . rivaroxaban  20 mg Oral Q supper  . voriconazole  200 mg Oral BID   Continuous: . sodium chloride 30 mL/hr at 09/20/19 1912  . piperacillin-tazobactam (ZOSYN)  IV 3.375 g (09/21/19 0537)   WEX:HBZJIRCVE, haloperidol lactate, LORazepam, ondansetron **OR** ondansetron (ZOFRAN) IV, traZODone  Assesment: He was admitted with community-acquired pneumonia and respiratory failure.  He is still requiring 6 L oxygen.  His pneumonia has already improved some by chest x-ray.  He had severe sepsis with elevated lactate and that has improved  He has sleep apnea at baseline but did not tolerate CPAP  He had significant metabolic encephalopathy on admission which has resolved.  Certainly potentially could have been related to hypoxia or sepsis or both  He has persistent atrial fib and has a  pacemaker  At baseline he has severe persistent asthma and is being treated for aspergillosis.  He follows with pulmonary in [place] Principal Problem:   Acute on chronic respiratory failure (HCC) Active Problems:   Essential hypertension, benign   Premature ventricular contractions   PPM-St.Jude   PVC's (premature ventricular contractions)   Sinoatrial node dysfunction (HCC)   Persistent atrial fibrillation   Hyperthyroidism   Hyperlipidemia   RBBB (right bundle branch block)   Carotid artery stenosis   Type 2 diabetes mellitus (HCC)   OSA (obstructive sleep apnea)   Severe persistent asthma   CAP (community acquired pneumonia)   Chronic bronchitis (HCC)   Severe sepsis (HCC)   Elevated lactic acid level   GERD (gastroesophageal reflux disease)    Plan: Continue treatments.  He does seem to be improving.  He had orders to transfer to telemetry yesterday but no beds available    LOS: 3 days   Alonza Bogus 09/21/2019, 7:44 AM

## 2019-09-21 NOTE — Care Management Important Message (Signed)
Important Message  Patient Details  Name: Clarence Dawson MRN: [id number] Date of Birth: Jan 26, 1946   Medicare Important Message Given:  Yes     Tommy Medal 09/21/2019, 12:12 PM

## 2019-09-21 NOTE — Progress Notes (Signed)
Pharmacy Antibiotic Note  Clarence Dawson is a 73 y.o. male admitted on 09/18/2019 with sepsis.  Pharmacy has been consulted for  zosyn dosing. Afebrile, improving.  Plan: Continue Zosyn 3.375gm IV q8h EID over 4 hours F/U cxs and clinical progress Monitor V/S, labs   Height: 5\' 8"  (172.7 cm) Weight: 233 lb 11 oz (106 kg) IBW/kg (Calculated) : 68.4  Temp (24hrs), Avg:98.2 F (36.8 C), Min:98 F (36.7 C), Max:98.4 F (36.9 C)  Recent Labs  Lab 09/18/19 0747 09/18/19 1007 09/19/19 0455 09/20/19 0411 09/21/19 0633  WBC 7.6  --  8.0 7.1 5.4  CREATININE 1.16  --  0.84 0.96 0.67  LATICACIDVEN 4.2* 1.4  --   --   --     Estimated Creatinine Clearance: 97 mL/min (by C-G formula based on SCr of 0.67 mg/dL).    Allergies  Allergen Reactions  . Food Anaphylaxis and Shortness Of Breath    TREE NUTS  . Iodinated Diagnostic Agents Hives and Shortness Of Breath    Patient states hives to throat closing. (01/15/17: patient states this reaction was "about 20 years ago" with possibly an IVP.  He now says high doses of prednisone "throw me into AFib."  He has tolerated CT arthrograms with Benadrly 50mg  PO one hour before injection.  Brita Romp, RN)  . Shellfish Allergy Anaphylaxis and Shortness Of Breath    To shellfish, crabs.  Makes him feel like "things are crawling all over" me.  Denies airway issues with these foods.  Brita Romp, RN 01/15/17)  . Goat-Derived Child psychotherapist   . Prednisone Palpitations    PRECIPITATES A-FIB  . Metformin And Related Diarrhea    High doses at once  . Voltaren [Diclofenac Sodium] Other (See Comments)    Feels like things are crawling on him    Antimicrobials this admission: Vancomycin 9/20>>9/21  Zosyn 9/20 >>   Microbiology results: 9/20 BCx: ngtd 9/20 UCx: no growth 9/20 MRSA PCR: negative  Thank you for allowing pharmacy to be a part of this patient's care.  Isac Sarna, BS Pharm D, BCPS Clinical Pharmacist Pager  [phone] 09/21/2019 9:30 AM

## 2019-09-22 DIAGNOSIS — I480 Paroxysmal atrial fibrillation: Secondary | ICD-10-CM

## 2019-09-22 DIAGNOSIS — I4891 Unspecified atrial fibrillation: Secondary | ICD-10-CM

## 2019-09-22 LAB — CBC WITH DIFFERENTIAL/PLATELET
Abs Immature Granulocytes: 0.02 10*3/uL (ref 0.00–0.07)
Basophils Absolute: 0 10*3/uL (ref 0.0–0.1)
Basophils Relative: 1 %
Eosinophils Absolute: 0.1 10*3/uL (ref 0.0–0.5)
Eosinophils Relative: 2 %
HCT: 49.6 % (ref 39.0–52.0)
Hemoglobin: 15.7 g/dL (ref 13.0–17.0)
Immature Granulocytes: 0 %
Lymphocytes Relative: 23 %
Lymphs Abs: 1.3 10*3/uL (ref 0.7–4.0)
MCH: 30.6 pg (ref 26.0–34.0)
MCHC: 31.7 g/dL (ref 30.0–36.0)
MCV: 96.7 fL (ref 80.0–100.0)
Monocytes Absolute: 0.6 10*3/uL (ref 0.1–1.0)
Monocytes Relative: 11 %
Neutro Abs: 3.5 10*3/uL (ref 1.7–7.7)
Neutrophils Relative %: 63 %
Platelets: 172 10*3/uL (ref 150–400)
RBC: 5.13 MIL/uL (ref 4.22–5.81)
RDW: 13.3 % (ref 11.5–15.5)
WBC: 5.5 10*3/uL (ref 4.0–10.5)
nRBC: 0 % (ref 0.0–0.2)

## 2019-09-22 LAB — COMPREHENSIVE METABOLIC PANEL
ALT: 26 U/L (ref 0–44)
AST: 20 U/L (ref 15–41)
Albumin: 3 g/dL — ABNORMAL LOW (ref 3.5–5.0)
Alkaline Phosphatase: 52 U/L (ref 38–126)
Anion gap: 4 — ABNORMAL LOW (ref 5–15)
BUN: 22 mg/dL (ref 8–23)
CO2: 26 mmol/L (ref 22–32)
Calcium: 8.3 mg/dL — ABNORMAL LOW (ref 8.9–10.3)
Chloride: 110 mmol/L (ref 98–111)
Creatinine, Ser: 0.83 mg/dL (ref 0.61–1.24)
GFR calc Af Amer: >60 mL/min (ref >60)
GFR calc non Af Amer: >60 mL/min (ref >60)
Glucose, Bld: 146 mg/dL — ABNORMAL HIGH (ref 70–99)
Potassium: 3.8 mmol/L (ref 3.5–5.1)
Sodium: 140 mmol/L (ref 135–145)
Total Bilirubin: 0.8 mg/dL (ref 0.3–1.2)
Total Protein: 6.3 g/dL — ABNORMAL LOW (ref 6.5–8.1)

## 2019-09-22 LAB — GLUCOSE, CAPILLARY
Glucose-Capillary: 147 mg/dL — ABNORMAL HIGH (ref 70–99)
Glucose-Capillary: 164 mg/dL — ABNORMAL HIGH (ref 70–99)
Glucose-Capillary: 169 mg/dL — ABNORMAL HIGH (ref 70–99)
Glucose-Capillary: 103 mg/dL — ABNORMAL HIGH (ref 70–99)
Glucose-Capillary: 147 mg/dL — ABNORMAL HIGH (ref 70–99)

## 2019-09-22 MED ORDER — DEXTROSE 50 % IV SOLN
INTRAVENOUS | Status: AC
  Filled 2019-09-22: qty 50

## 2019-09-22 MED ORDER — AMOXICILLIN-POT CLAVULANATE 875-125 MG PO TABS
1.0000 | ORAL_TABLET | Freq: Two times a day (BID) | ORAL | Status: DC
  Administered 2019-09-22 – 2019-09-25 (×7): 1 via ORAL
  Filled 2019-09-22 (×7): qty 1

## 2019-09-22 MED ORDER — FLECAINIDE ACETATE 50 MG PO TABS
200.0000 mg | ORAL_TABLET | Freq: Once | ORAL | Status: AC
  Administered 2019-09-22: 12:00:00 200 mg via ORAL
  Filled 2019-09-22: qty 4

## 2019-09-22 MED ORDER — DIPHENHYDRAMINE HCL 50 MG/ML IJ SOLN
25.0000 mg | Freq: Three times a day (TID) | INTRAMUSCULAR | Status: DC | PRN
  Administered 2019-09-22 – 2019-09-24 (×3): 25 mg via INTRAVENOUS
  Filled 2019-09-22 (×3): qty 1

## 2019-09-22 NOTE — Progress Notes (Signed)
Progress Note  Patient Name: Clarence Dawson Date of Encounter: 09/22/2019  Primary Cardiologist: Cristopher Peru, MD  Subjective   Sitting in bedside chair.  Did report shortness of breath last night, some cough this morning.  No chest pain or palpitations.  Inpatient Medications    Scheduled Meds: . amoxicillin-clavulanate  1 tablet Oral Q12H  . bisoprolol  7.5 mg Oral BID  . Chlorhexidine Gluconate Cloth  6 each Topical Daily  . dextrose      . flecainide  200 mg Oral Once  . flecainide  75 mg Oral BID  . guaiFENesin  1,200 mg Oral BID  . insulin aspart  0-20 Units Subcutaneous TID WC  . insulin aspart  0-5 Units Subcutaneous QHS  . insulin aspart  5 Units Subcutaneous TID WC  . ipratropium-albuterol  3 mL Nebulization Q4H  . levothyroxine  137 mcg Oral QAC breakfast  . mouth rinse  15 mL Mouth Rinse q12n4p  . mometasone-formoterol  2 puff Inhalation BID  . montelukast  10 mg Oral QHS  . multivitamin with minerals  1 tablet Oral Daily  . pantoprazole  40 mg Oral BID AC  . rivaroxaban  20 mg Oral Q supper  . voriconazole  200 mg Oral BID    PRN Meds: bisacodyl, haloperidol lactate, LORazepam, ondansetron **OR** ondansetron (ZOFRAN) IV, traZODone   Vital Signs    Vitals:   09/22/19 0600 09/22/19 0700 09/22/19 0800 09/22/19 0810  BP:   115/81   Pulse: (!) 102 93 (!) 105   Resp: 11 13 14    Temp:    97.8 F (36.6 C)  TempSrc:    Oral  SpO2: 92% 95% 94%   Weight:      Height:        Intake/Output Summary (Last 24 hours) at 09/22/2019 1002 Last data filed at 09/22/2019 0847 Gross per 24 hour  Intake 343.12 ml  Output 1375 ml  Net -1031.88 ml   Filed Weights   09/19/19 0500 09/20/19 0500 09/22/19 0500  Weight: 106.8 kg 106 kg 107.8 kg    Telemetry    Suspected atrial flutter with ventricular tracking at 2:1 block.  Personally reviewed.  Physical Exam   GEN:  Elderly male.  No acute distress.   Neck: No JVD. Cardiac:  Regular rhythm, no gallop.   Respiratory:  Coarse breath sounds with rhonchi and expiratory wheeze. GI: Soft, nontender, bowel sounds present. MS: No edema; No deformity. Neuro:  Nonfocal. Psych: Alert and oriented x 3. Normal affect.  Labs    Chemistry Recent Labs  Lab 09/20/19 0411 09/21/19 0633 09/22/19 0424  NA 140 140 140  K 4.6 4.0 3.8  CL 109 109 110  CO2 25 25 26   GLUCOSE 161* 160* 146*  BUN 34* 20 22  CREATININE 0.96 0.67 0.83  CALCIUM 8.2* 8.1* 8.3*  PROT 6.1* 6.0* 6.3*  ALBUMIN 2.8* 2.9* 3.0*  AST 18 20 20   ALT 21 24 26   ALKPHOS 58 53 52  BILITOT 0.6 0.7 0.8  GFRNONAA >60 >60 >60  GFRAA >60 >60 >60  ANIONGAP 6 6 4*     Hematology Recent Labs  Lab 09/20/19 0411 09/21/19 0633 09/22/19 0424  WBC 7.1 5.4 5.5  RBC 4.95 5.15 5.13  HGB 15.2 16.1 15.7  HCT 47.7 49.7 49.6  MCV 96.4 96.5 96.7  MCH 30.7 31.3 30.6  MCHC 31.9 32.4 31.7  RDW 13.2 13.2 13.3  PLT 183 162 172    Radiology  No results found.  Cardiac Studies   Echocardiogram: 09/2016 Study Conclusions  - Left ventricle: The cavity size was normal. Wall thickness was increased in a pattern of mild LVH. Systolic function was normal. The estimated ejection fraction was in the range of 60% to 65%. Wall motion was normal; there were no regional wall motion abnormalities. Features are consistent with a pseudonormal left ventricular filling pattern, with concomitant abnormal relaxation and increased filling pressure (grade 2 diastolic dysfunction). - Aortic valve: Mildly to moderately calcified annulus. Moderately calcified leaflets. There was mild to moderate stenosis. Valve area (VTI): 1.3 cm^2. - Mitral valve: Calcified annulus. There was trivial regurgitation. - Left atrium: The atrium was mildly to moderately dilated. - Right ventricle: Pacer wire or catheter noted in right ventricle. - Right atrium: Central venous pressure (est): 3 mm Hg. - Tricuspid valve: There was trivial regurgitation. -  Pulmonary arteries: PA peak pressure: 48 mm Hg (S). - Pericardium, extracardiac: A prominent pericardial fat pad was present.  Impressions:  - Mild LVH with LVEF 60-65%. Grade 2 diastolic dysfunction. Mild to moderate left atrial enlargement. Mildly calcified mitral annulus with trivial mitral regurgitation. Moderately calcified aortic valve, leaflet structure difficult to identify. There is evidence of mild to moderate aortic stenosis. Device wire present in the right heart. Trivial tricuspid regurgitation with PASP 48 mmHg.  Patient Profile     73 y.o. male with past medical history of SSS (s/p PPM placement with gen change in 2014), paroxysmal atrial fibrillation, chronic diastolic CHF, HTN, COPD, and GERD who is being seen for the evaluation of atrial fibrillation with RVR.   Assessment & Plan    1.  Atrial fibrillation with RVR with known history of PAF.  He was given a bolus dose of flecainide yesterday in hopes of converting to sinus rhythm but looks to be in atrial flutter today, although heart rate is better.  He is on Xarelto for stroke prophylaxis.  2.  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 Lovena Le.  3.  Sepsis with lobar pneumonia, remains on broad-spectrum antibiotics per primary team.  Also likely contributing to problem #1.  4.  Essential hypertension, blood pressure stable with systolics [id number].  Remains on Zebeta but other outpatient antihypertensives are held at this time.  We will give flecainide 200 mg dose around midday and otherwise continue present cardiac medications.  Follow-up ECGs ordered.  If he does not convert to sinus rhythm, may ultimately need to consider an electrical cardioversion, but would wait until he recovers from his pneumonia.  Signed, Rozann Lesches, MD  09/22/2019, 10:02 AM

## 2019-09-22 NOTE — Progress Notes (Signed)
Subjective: He says he feels okay.  He developed atrial fib yesterday and still has some bursts of what looks like atrial fib on the monitor.  He says his breathing is not quite as good today.  He is still coughing.  Objective: Vital signs in last 24 hours: Temp:  [97.4 F (36.3 C)-98.7 F (37.1 C)] 98.5 F (36.9 C) (09/24 0400) Pulse Rate:  [93-122] 99 (09/24 0500) Resp:  [9-29] 9 (09/24 0500) BP: (101-132)/(72-84) 132/84 (09/24 0400) SpO2:  [90 %-99 %] 95 % (09/24 0504) Weight:  [107.8 kg] 107.8 kg (09/24 0500) Weight change:  Last BM Date: 09/21/19  Intake/Output from previous day: 09/23 0701 - 09/24 0700 In: 213 [I.V.:113; IV Piggyback:100] Out: 1500 [Urine:1500]  PHYSICAL EXAM General appearance: alert, cooperative and no distress Resp: rhonchi bilaterally Cardio: Rhythm is mostly paced with occasional burst of what looks like atrial fib GI: soft, non-tender; bowel sounds normal; no masses,  no organomegaly Extremities: extremities normal, atraumatic, no cyanosis or edema  Lab Results:  Results for orders placed or performed during the hospital encounter of 09/18/19 (from the past 48 hour(s))  Glucose, capillary     Status: Abnormal   Collection Time: 09/20/19  7:36 AM  Result Value Ref Range   Glucose-Capillary 153 (H) 70 - 99 mg/dL  Glucose, capillary     Status: Abnormal   Collection Time: 09/20/19 11:28 AM  Result Value Ref Range   Glucose-Capillary 214 (H) 70 - 99 mg/dL  Glucose, capillary     Status: Abnormal   Collection Time: 09/20/19  4:26 PM  Result Value Ref Range   Glucose-Capillary 156 (H) 70 - 99 mg/dL  Glucose, capillary     Status: Abnormal   Collection Time: 09/20/19  9:07 PM  Result Value Ref Range   Glucose-Capillary 123 (H) 70 - 99 mg/dL   Comment 1 Notify RN    Comment 2 Document in Chart   Glucose, capillary     Status: Abnormal   Collection Time: 09/21/19  3:38 AM  Result Value Ref Range   Glucose-Capillary 154 (H) 70 - 99 mg/dL   Comment 1 Notify RN    Comment 2 Document in Chart   Comprehensive metabolic panel     Status: Abnormal   Collection Time: 09/21/19  6:33 AM  Result Value Ref Range   Sodium 140 135 - 145 mmol/L   Potassium 4.0 3.5 - 5.1 mmol/L   Chloride 109 98 - 111 mmol/L   CO2 25 22 - 32 mmol/L   Glucose, Bld 160 (H) 70 - 99 mg/dL   BUN 20 8 - 23 mg/dL   Creatinine, Ser 0.67 0.61 - 1.24 mg/dL   Calcium 8.1 (L) 8.9 - 10.3 mg/dL   Total Protein 6.0 (L) 6.5 - 8.1 g/dL   Albumin 2.9 (L) 3.5 - 5.0 g/dL   AST 20 15 - 41 U/L   ALT 24 0 - 44 U/L   Alkaline Phosphatase 53 38 - 126 U/L   Total Bilirubin 0.7 0.3 - 1.2 mg/dL   GFR calc non Af Amer >60 >60 mL/min   GFR calc Af Amer >60 >60 mL/min   Anion gap 6 5 - 15    Comment: Performed at [facility], [address], [place], [place] [address]  Magnesium     Status: None   Collection Time: 09/21/19  6:33 AM  Result Value Ref Range   Magnesium 2.3 1.7 - 2.4 mg/dL    Comment: Performed at [facility],  [address], [place], [place] [address]  CBC WITH DIFFERENTIAL     Status: None   Collection Time: 09/21/19  6:33 AM  Result Value Ref Range   WBC 5.4 4.0 - 10.5 K/uL   RBC 5.15 4.22 - 5.81 MIL/uL   Hemoglobin 16.1 13.0 - 17.0 g/dL   HCT 49.7 39.0 - 52.0 %   MCV 96.5 80.0 - 100.0 fL   MCH 31.3 26.0 - 34.0 pg   MCHC 32.4 30.0 - 36.0 g/dL   RDW 13.2 11.5 - 15.5 %   Platelets 162 150 - 400 K/uL   nRBC 0.0 0.0 - 0.2 %   Neutrophils Relative % 62 %   Neutro Abs 3.4 1.7 - 7.7 K/uL   Lymphocytes Relative 26 %   Lymphs Abs 1.4 0.7 - 4.0 K/uL   Monocytes Relative 10 %   Monocytes Absolute 0.5 0.1 - 1.0 K/uL   Eosinophils Relative 2 %   Eosinophils Absolute 0.1 0.0 - 0.5 K/uL   Basophils Relative 0 %   Basophils Absolute 0.0 0.0 - 0.1 K/uL   Immature Granulocytes 0 %   Abs Immature Granulocytes 0.02 0.00 - 0.07 K/uL    Comment: Performed at [facility], [address], [place], [place] [address]  Glucose, capillary     Status: Abnormal    Collection Time: 09/21/19  8:04 AM  Result Value Ref Range   Glucose-Capillary 154 (H) 70 - 99 mg/dL  Procalcitonin - Baseline     Status: None   Collection Time: 09/21/19 11:19 AM  Result Value Ref Range   Procalcitonin <0.10 ng/mL    Comment:        Interpretation: PCT (Procalcitonin) <= 0.5 ng/mL: Systemic infection (sepsis) is not likely. Local bacterial infection is possible. (NOTE)       Sepsis PCT Algorithm           Lower Respiratory Tract                                      Infection PCT Algorithm    ----------------------------     ----------------------------         PCT < 0.25 ng/mL                PCT < 0.10 ng/mL         Strongly encourage             Strongly discourage   discontinuation of antibiotics    initiation of antibiotics    ----------------------------     -----------------------------       PCT 0.25 - 0.50 ng/mL            PCT 0.10 - 0.25 ng/mL               OR       >80% decrease in PCT            Discourage initiation of                                            antibiotics      Encourage discontinuation           of antibiotics    ----------------------------     -----------------------------         PCT >= 0.50 ng/mL  PCT 0.26 - 0.50 ng/mL               AND        <80% decrease in PCT             Encourage initiation of                                             antibiotics       Encourage continuation           of antibiotics    ----------------------------     -----------------------------        PCT >= 0.50 ng/mL                  PCT > 0.50 ng/mL               AND         increase in PCT                  Strongly encourage                                      initiation of antibiotics    Strongly encourage escalation           of antibiotics                                     -----------------------------                                           PCT <= 0.25 ng/mL                                                 OR                                         > 80% decrease in PCT                                     Discontinue / Do not initiate                                             antibiotics Performed at [facility], [address] [place], [place] [address]   Glucose, capillary     Status: Abnormal   Collection Time: 09/21/19 11:38 AM  Result Value Ref Range   Glucose-Capillary 220 (H) 70 - 99 mg/dL  Glucose, capillary     Status: Abnormal   Collection Time: 09/21/19  4:11 PM  Result Value Ref Range   Glucose-Capillary 119 (H) 70 - 99 mg/dL   Comment 1 Notify RN  Comment 2 Document in Chart   Glucose, capillary     Status: Abnormal   Collection Time: 09/21/19  9:18 PM  Result Value Ref Range   Glucose-Capillary 122 (H) 70 - 99 mg/dL  Glucose, capillary     Status: Abnormal   Collection Time: 09/22/19  2:49 AM  Result Value Ref Range   Glucose-Capillary 147 (H) 70 - 99 mg/dL  Comprehensive metabolic panel     Status: Abnormal   Collection Time: 09/22/19  4:24 AM  Result Value Ref Range   Sodium 140 135 - 145 mmol/L   Potassium 3.8 3.5 - 5.1 mmol/L   Chloride 110 98 - 111 mmol/L   CO2 26 22 - 32 mmol/L   Glucose, Bld 146 (H) 70 - 99 mg/dL   BUN 22 8 - 23 mg/dL   Creatinine, Ser 0.83 0.61 - 1.24 mg/dL   Calcium 8.3 (L) 8.9 - 10.3 mg/dL   Total Protein 6.3 (L) 6.5 - 8.1 g/dL   Albumin 3.0 (L) 3.5 - 5.0 g/dL   AST 20 15 - 41 U/L   ALT 26 0 - 44 U/L   Alkaline Phosphatase 52 38 - 126 U/L   Total Bilirubin 0.8 0.3 - 1.2 mg/dL   GFR calc non Af Amer >60 >60 mL/min   GFR calc Af Amer >60 >60 mL/min   Anion gap 4 (L) 5 - 15    Comment: Performed at [facility], [address], [place], [place] [address]  CBC WITH DIFFERENTIAL     Status: None   Collection Time: 09/22/19  4:24 AM  Result Value Ref Range   WBC 5.5 4.0 - 10.5 K/uL   RBC 5.13 4.22 - 5.81 MIL/uL   Hemoglobin 15.7 13.0 - 17.0 g/dL   HCT 49.6 39.0 - 52.0 %   MCV 96.7 80.0 - 100.0 fL   MCH 30.6 26.0 - 34.0 pg   MCHC 31.7 30.0 -  36.0 g/dL   RDW 13.3 11.5 - 15.5 %   Platelets 172 150 - 400 K/uL   nRBC 0.0 0.0 - 0.2 %   Neutrophils Relative % 63 %   Neutro Abs 3.5 1.7 - 7.7 K/uL   Lymphocytes Relative 23 %   Lymphs Abs 1.3 0.7 - 4.0 K/uL   Monocytes Relative 11 %   Monocytes Absolute 0.6 0.1 - 1.0 K/uL   Eosinophils Relative 2 %   Eosinophils Absolute 0.1 0.0 - 0.5 K/uL   Basophils Relative 1 %   Basophils Absolute 0.0 0.0 - 0.1 K/uL   Immature Granulocytes 0 %   Abs Immature Granulocytes 0.02 0.00 - 0.07 K/uL    Comment: Performed at [facility], [address] [place], [place] [address]    ABGS No results for input(s): PHART, PO2ART, TCO2, HCO3 in the last 72 hours.  Invalid input(s): PCO2 CULTURES Recent Results (from the past 240 hour(s))  Aspergillus Ag, BAL/Serum     Status: None   Collection Time: 09/12/19  9:29 AM  Result Value Ref Range Status   Aspergillus Ag, BAL/Serum 0.04 0.00 - 0.49 Index Final    Comment: (NOTE) Performed At: [facility] [address] [place], [place] [id number] Rush Farmer MD PJ:[id number] Performed At: [facility] [address] [place], [place] [id number] Katina Degree MDPhD NO:[id number]   Culture, blood (Routine x 2)     Status: None (Preliminary result)   Collection Time: 09/18/19  7:47 AM   Specimen: BLOOD RIGHT HAND  Result Value Ref Range  Status   Specimen Description BLOOD RIGHT HAND  Final   Special Requests   Final    BOTTLES DRAWN AEROBIC AND ANAEROBIC Blood Culture results may not be optimal due to an inadequate volume of blood received in culture bottles   Culture   Final    NO GROWTH 3 DAYS Performed at [facility], [address], [place], [place] [address]    Report Status PENDING  Incomplete  Culture, blood (Routine x 2)     Status: None (Preliminary result)   Collection Time: 09/18/19  7:52 AM   Specimen: Right Antecubital; Blood  Result Value Ref Range Status   Specimen Description RIGHT ANTECUBITAL  Final   Special  Requests   Final    Blood Culture results may not be optimal due to an inadequate volume of blood received in culture bottles BOTTLES DRAWN AEROBIC AND ANAEROBIC   Culture   Final    NO GROWTH 3 DAYS Performed at [facility], [address], [place], [place] [address]    Report Status PENDING  Incomplete  Urine culture     Status: None   Collection Time: 09/18/19  7:59 AM   Specimen: In/Out Cath Urine  Result Value Ref Range Status   Specimen Description   Final    IN/OUT CATH URINE Performed at [facility], [address] [place], [place] [address]    Special Requests   Final    NONE Performed at [facility], [address], [place], [place] [address]    Culture   Final    NO GROWTH Performed at [facility], [place] [address] [place], [place] [address]    Report Status 09/19/2019 FINAL  Final  SARS Coronavirus 2 [facility] order, Performed in [facility] hospital lab) Nasopharyngeal Nasopharyngeal Swab     Status: None   Collection Time: 09/18/19  8:19 AM   Specimen: Nasopharyngeal Swab  Result Value Ref Range Status   SARS Coronavirus 2 NEGATIVE NEGATIVE Final    Comment: (NOTE) If result is NEGATIVE SARS-CoV-2 target nucleic acids are NOT DETECTED. The SARS-CoV-2 RNA is generally detectable in upper and lower  respiratory specimens during the acute phase of infection. The lowest  concentration of SARS-CoV-2 viral copies this assay can detect is 250  copies / mL. A negative result does not preclude SARS-CoV-2 infection  and should not be used as the sole basis for treatment or other  patient management decisions.  A negative result may occur with  improper specimen collection / handling, submission of specimen other  than nasopharyngeal swab, presence of viral mutation(s) within the  areas targeted by this assay, and inadequate number of viral copies  (<250 copies / mL). A negative result must be combined with clinical  observations, patient history, and  epidemiological information. If result is POSITIVE SARS-CoV-2 target nucleic acids are DETECTED. The SARS-CoV-2 RNA is generally detectable in upper and lower  respiratory specimens dur ing the acute phase of infection.  Positive  results are indicative of active infection with SARS-CoV-2.  Clinical  correlation with patient history and other diagnostic information is  necessary to determine patient infection status.  Positive results do  not rule out bacterial infection or co-infection with other viruses. If result is PRESUMPTIVE POSTIVE SARS-CoV-2 nucleic acids MAY BE PRESENT.   A presumptive positive result was obtained on the submitted specimen  and confirmed on repeat testing.  While 2019 novel coronavirus  (SARS-CoV-2) nucleic acids may be present in the submitted sample  additional confirmatory testing may be necessary for epidemiological  and / or clinical management purposes  to differentiate between  SARS-CoV-2 and other Sarbecovirus currently known to infect humans.  If clinically indicated additional testing with an alternate test  methodology [phone]) is advised. The SARS-CoV-2 RNA is generally  detectable in upper and lower respiratory sp ecimens during the acute  phase of infection. The expected result is Negative. Fact Sheet for Patients:  StrictlyIdeas.no Fact Sheet for Healthcare Providers: BankingDealers.co.za This test is not yet approved or cleared by the [place] FDA and has been authorized for detection and/or diagnosis of SARS-CoV-2 by FDA under an Emergency Use Authorization (EUA).  This EUA will remain in effect (meaning this test can be used) for the duration of the COVID-19 declaration under Section 564(b)(1) of the Act, 21 U.S.C. section 360bbb-3(b)(1), unless the authorization is terminated or revoked sooner. Performed at [facility], [address] [place], [place] [address]   MRSA PCR Screening      Status: None   Collection Time: 09/18/19 11:43 AM   Specimen: Nasal Mucosa; Nasopharyngeal  Result Value Ref Range Status   MRSA by PCR NEGATIVE NEGATIVE Final    Comment:        The GeneXpert MRSA Assay (FDA approved for NASAL specimens only), is one component of a comprehensive MRSA colonization surveillance program. It is not intended to diagnose MRSA infection nor to guide or monitor treatment for MRSA infections. Performed at [facility], [address] [place], [place] [address]    Studies/Results: No results found.  Medications:  Prior to Admission:  Facility-Administered Medications Prior to Admission  Medication Dose Route Frequency Provider Last Rate Last Dose  . omalizumab Arvid Right) injection 300 mg  300 mg Subcutaneous Q14 Days [name], MD   300 mg at 02/09/19 1519   Medications Prior to Admission  Medication Sig Dispense Refill Last Dose  . acetaminophen (TYLENOL) 500 MG tablet Take 1,000 mg by mouth 3 (three) times daily.   09/18/2019 at 0415  . Aclidinium Bromide (TUDORZA PRESSAIR) 400 MCG/ACT AEPB 1 puff BID 1 each 2 09/17/2019 at Unknown time  . albuterol (ACCUNEB) 0.63 MG/3ML nebulizer solution Take 3 mLs by nebulization every 4 (four) hours as needed for shortness of breath.     Marland Kitchen albuterol (PROVENTIL HFA;VENTOLIN HFA) 108 (90 Base) MCG/ACT inhaler Inhale 2 puffs every 4 (four) hours as needed into the lungs for shortness of breath (only if you can't catch your breath).     Marland Kitchen amLODipine (NORVASC) 5 MG tablet Take 1 tablet (5 mg total) by mouth daily. 90 tablet 3 09/17/2019 at Unknown time  . bisoprolol (ZEBETA) 5 MG tablet TAKE 1 AND 1/2 TABLET BY MOUTH TWICE DAILY. 90 tablet 3 09/17/2019 at Unknown time  . canagliflozin (INVOKANA) 300 MG TABS tablet Take 300 mg by mouth daily before breakfast.   09/17/2019 at Unknown time  . Carboxymethylcellul-Glycerin (LUBRICATING EYE DROPS OP) Place 1 drop into the right eye daily as needed (dry eyes).   09/17/2019  at Unknown time  . cloNIDine (CATAPRES) 0.1 MG tablet Take 0.1 mg by mouth 2 (two) times daily.   09/17/2019 at Unknown time  . diphenhydramine-acetaminophen (TYLENOL PM) 25-500 MG TABS tablet Take 1 tablet by mouth at bedtime.     Marland Kitchen EPINEPHrine 0.3 mg/0.3 mL IJ SOAJ injection Inject 0.3 mg into the muscle as needed for anaphylaxis.    Past Month at Unknown time  . flecainide (TAMBOCOR) 150 MG tablet Take 0.5 tablets (  75 mg total) by mouth 2 (two) times daily. 90 tablet 3 09/17/2019 at Unknown time  . glipiZIDE (GLUCOTROL XL) 5 MG 24 hr tablet Take 5 mg by mouth daily before breakfast.   09/17/2019 at Unknown time  . guaiFENesin (MUCINEX) 600 MG 12 hr tablet Take 600 mg by mouth 2 (two) times daily.    09/17/2019 at Unknown time  . hydrocortisone cream 1 % Apply 1 application topically daily as needed for itching.     . levothyroxine (SYNTHROID, LEVOTHROID) 137 MCG tablet Take 137 mcg by mouth daily before breakfast.    09/17/2019 at Unknown time  . losartan-hydrochlorothiazide (HYZAAR) 100-25 MG per tablet Take 1 tablet by mouth daily.   09/17/2019 at Unknown time  . metFORMIN (GLUCOPHAGE-XR) 500 MG 24 hr tablet Take 1,000 mg by mouth at bedtime.    09/17/2019 at Unknown time  . montelukast (SINGULAIR) 10 MG tablet TAKE (1) TABLET BY MOUTH AT BEDTIME. 30 tablet 0 09/17/2019 at Unknown time  . Multiple Vitamin (MULTIVITAMIN) tablet Take 1 tablet by mouth daily.     09/17/2019 at Unknown time  . omeprazole (PRILOSEC OTC) 20 MG tablet Take 20 mg by mouth daily.    09/17/2019 at Unknown time  . rivaroxaban (XARELTO) 20 MG TABS tablet Take 20 mg daily with supper by mouth.   09/17/2019 at Unknown time  . voriconazole (VFEND) 200 MG tablet Take 1 tablet (200 mg total) by mouth 2 (two) times daily. 180 tablet 0 09/17/2019 at Unknown time  . WIXELA INHUB 500-50 MCG/DOSE AEPB INHALE 1 PUFF INTO THE LUNGS TWICE DAILY. RINSE MOUTH AFTER USE. 180 each 0 09/17/2019 at Unknown time  . XOLAIR 150 MG/ML prefilled syringe  INJECT 300MG SUBCUTANEOUSLY EVERY 4 WEEKS (GIVEN AT MD  OFFICE) 4 mL 5 Past Month at Unknown time  . Respiratory Therapy Supplies (FLUTTER) DEVI USE AS DIRECTED (Patient not taking: Reported on 09/18/2019) 1 each 0 Not Taking at Unknown time   Scheduled: . acetaminophen  650 mg Oral Q6H   Or  . acetaminophen  650 mg Rectal Q6H  . bisoprolol  7.5 mg Oral BID  . Chlorhexidine Gluconate Cloth  6 each Topical Daily  . dextrose      . flecainide  75 mg Oral BID  . guaiFENesin  1,200 mg Oral BID  . insulin aspart  0-20 Units Subcutaneous TID WC  . insulin aspart  0-5 Units Subcutaneous QHS  . insulin aspart  5 Units Subcutaneous TID WC  . ipratropium-albuterol  3 mL Nebulization Q4H  . levothyroxine  137 mcg Oral QAC breakfast  . mouth rinse  15 mL Mouth Rinse q12n4p  . mometasone-formoterol  2 puff Inhalation BID  . montelukast  10 mg Oral QHS  . multivitamin with minerals  1 tablet Oral Daily  . pantoprazole  40 mg Oral BID AC  . rivaroxaban  20 mg Oral Q supper  . voriconazole  200 mg Oral BID   Continuous: . sodium chloride 10 mL/hr at 09/21/19 1940  . piperacillin-tazobactam (ZOSYN)  IV 3.375 g (09/22/19 0609)   UVO:[id number], haloperidol lactate, LORazepam, ondansetron **OR** ondansetron (ZOFRAN) IV, traZODone  Assesment: He was admitted with pneumonia.  He has acute hypoxic respiratory failure.  He is doing better as far as his pneumonia is concerned but he feels like he is a little more short of breath today.  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[place]ude   PVC's (premature ventricular contractions)   Sinoatrial node dysfunction (HCC)   Persistent atrial fibrillation   Hyperthyroidism   Hyperlipidemia   RBBB (right bundle branch block)   Carotid artery stenosis   Type 2 diabetes mellitus (HCC)   OSA (obstructive sleep apnea)   Severe persistent asthma   CAP (community acquired pneumonia)   Chronic bronchitis (HCC)   Severe sepsis (HCC)   Elevated lactic acid level   GERD (gastroesophageal reflux disease)   Lobar pneumonia ([place])   Acute respiratory failure with hypoxia ([place])    Plan: Continue current treatments    LOS: 4 days   Alonza Bogus 09/22/2019, 7:24 AM

## 2019-09-22 NOTE — Progress Notes (Signed)
Pt complaining of raised, red itching rash on bilateral forearms. Pt states he took Vanc at home; rash appeared when he went into the sun and worsened. During admission he has been receiving IV zosyn. Paged Dr.Blount for possible orders to relieve itching. Will continue to monitor.   Also, decreased O2 to 3L. O2 sats at 93-95%. Will continue to monitor.

## 2019-09-22 NOTE — Progress Notes (Signed)
PROGRESS NOTE  Clarence Dawson [id number] DOB: 1946-01-18 DOA: 09/18/2019 PCP: [name], MD   Brief History:  73 year old male with a history of COPD, allergic bronchopulmonary aspergillosis, severe persistent asthma, paroxysmal atrial fibrillation, sinus node dysfunction status post PPM, hypothyroidism, hyperlipidemia presented with fevers and confusion.  In emergency department, the patient was noted to have temperature of 101.8 F with lactic acid 4.2.  Patient had oxygen saturation 85% on room air.  Chest x-ray showed left basilar opacity/infiltrate.  Patient was started on intravenous Zosyn and vancomycin initially.  Pulmonary medicine was consulted to assist with management. Assessment/Plan: Acute respiratory failure with hypoxia -Secondary to pneumonia in setting of asthma and ABPA -presently stable on 4L  Severe sepsis -Present on admission -Presented with fever up to 101.8 F with lactic acid 4.2 -Continue IV Zosyn>>>amox/clav -09/18/2019 blood cultures negative -Urinalysis negative for pyuria -Personally reviewed chest x-ray--R>L basilar opacity  Lobar pneumonia -Continue Zosyn>>>amox/clav -Check procalcitonin <0.10  Paroxysmal atrial fibrillation with RVR -continue flecainide -continue bisoprolol -BP soft with elevated HR -consult cardiology-appreciate recommendation -remains in afib although rate improving -Continue rivaroxaban  Essential Hypertension -d/c clonidine for now to allow for margin for BP -holding losartan/hctz -continue bisoprolol  Diabetes mellitus type 2 -Continue NovoLog sliding scale -Continue NovoLog 5 units with meals -holding invokana, glipizide, metformin -09/18/19 A1C--7.2  Hypothyroidism -Continue Synthroid  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  Has dry cough.  Denies cp, n/v/d, abd pain, f/c, headache  Objective: Vitals:   09/22/19 0600 09/22/19 0700 09/22/19 0800 09/22/19 0810  BP:   115/81   Pulse: (!) 102 93 (!) 105   Resp: _0 Temp:    97.8 F (36.6 C)  TempSrc:    Oral  SpO2: 92% 95% 94%   Weight:      Height:        Intake/Output Summary (Last 24 hours) at 09/22/2019 0835 Last data filed at 09/22/2019 0753 Gross per 24 hour  Intake 213.03 ml  Output 1375 ml  Net -1161.97 ml   Weight change:  Exam:   General:  Pt is alert, follows commands appropriately, not in acute distress  HEENT: No icterus, No thrush, No neck mass, [place]/AT  Cardiovascular: RRR, S1/S2, no rubs, no gallops  Respiratory:bibasilar rales, no wheeze  Abdomen: Soft/+BS, non tender, non distended, no guarding  Extremities: 1 + LE edema, No lymphangitis, No petechiae, No rashes, no synovitis   Data Reviewed: I have personally reviewed following labs and imaging studies Basic Metabolic Panel: Recent Labs  Lab 09/18/19 0747 09/19/19 0455 09/20/19 0411 09/21/19 0633 09/22/19 0424  NA 139 138 140 140 140  K 4.3 4.2 4.6 4.0 3.8  CL 99 108 109 109 110  CO2 26 20* _1 GLUCOSE 134* 150* 161* 160* 146*  BUN 19 27* 34* 20 22  CREATININE 1.16 0.84 0.96 0.67 0.83  CALCIUM 9.1 8.0* 8.2* 8.1* 8.3*  MG  --  2.2 2.4 2.3  --  Liver Function Tests: Recent Labs  Lab 09/18/19 0747 09/19/19 0455 09/20/19 0411 09/21/19 0633 09/22/19 0424  AST _0 ALT _1 ALKPHOS 102 72 58 53 52  BILITOT 1.2 1.0 0.6 0.7 0.8  PROT 7.5 6.1* 6.1* 6.0* 6.3*  ALBUMIN 4.0 2.9* 2.8* 2.9* 3.0*   No results for  input(s): LIPASE, AMYLASE in the last 168 hours. No results for input(s): AMMONIA in the last 168 hours. Coagulation Profile: Recent Labs  Lab 09/18/19 0747  INR 1.5*   CBC: Recent Labs  Lab 09/18/19 0747 09/19/19 0455 09/20/19 0411 09/21/19 0633 09/22/19 0424  WBC 7.6 8.0 7.1 5.4 5.5  NEUTROABS 4.3 6.9 5.2 3.4 3.5  HGB 17.9* 16.2 15.2 16.1 15.7  HCT 56.3* 49.8 47.7 49.7 49.6  MCV 96.2 95.2 96.4 96.5 96.7  PLT 195 179 183 162 172   Cardiac Enzymes: No results for input(s): CKTOTAL, CKMB, CKMBINDEX, TROPONINI in the last 168 hours. BNP: Invalid input(s): POCBNP CBG: Recent Labs  Lab 09/21/19 1138 09/21/19 1611 09/21/19 2118 09/22/19 0249 09/22/19 0808  GLUCAP 220* 119* 122* 147* 164*   HbA1C: No results for input(s): HGBA1C in the last 72 hours. Urine analysis:    Component Value Date/Time   COLORURINE YELLOW 09/18/2019 [place] 09/18/2019 0747   LABSPEC 1.021 09/18/2019 0747   PHURINE 6.0 09/18/2019 0747   GLUCOSEU >=500 (A) 09/18/2019 0747   HGBUR NEGATIVE 09/18/2019 0747   BILIRUBINUR NEGATIVE 09/18/2019 0747   KETONESUR 5 (A) 09/18/2019 0747   PROTEINUR 30 (A) 09/18/2019 0747   UROBILINOGEN 1.0 11/15/2012 1051   NITRITE NEGATIVE 09/18/2019 0747   LEUKOCYTESUR NEGATIVE 09/18/2019 0747   Sepsis Labs: _2 (procalcitonin:4,lacticidven:4) ) Recent Results (from the past 240 hour(s))  Aspergillus Ag, BAL/Serum     Status: None   Collection Time: 09/12/19  9:29 AM  Result Value Ref Range Status   Aspergillus Ag, BAL/Serum 0.04 0.00 - 0.49 Index Final    Comment: (NOTE) Performed At: [facility] [address] [place], [place] [id number] Rush Farmer MD IN:[id number] Performed At: [facility] [address] [place], [place] [id number] Katina Degree MDPhD HU:[id number]   Culture, blood (Routine x 2)     Status: None (Preliminary result)   Collection Time: 09/18/19  7:47 AM   Specimen: BLOOD RIGHT HAND  Result Value  Ref Range Status   Specimen Description BLOOD RIGHT HAND  Final   Special Requests   Final    BOTTLES DRAWN AEROBIC AND ANAEROBIC Blood Culture results may not be optimal due to an inadequate volume of blood received in culture bottles   Culture   Final    NO GROWTH 4 DAYS Performed at [facility], [address] [place], [place] [address]    Report Status PENDING  Incomplete  Culture, blood (Routine x 2)     Status: None (Preliminary result)   Collection Time: 09/18/19  7:52 AM   Specimen: Right Antecubital; Blood  Result Value Ref Range Status   Specimen Description RIGHT ANTECUBITAL  Final   Special Requests   Final    Blood Culture results may not be optimal due to an inadequate volume of blood received in culture bottles BOTTLES DRAWN AEROBIC AND ANAEROBIC   Culture   Final    NO GROWTH 4 DAYS Performed at [facility], [address], [place], [place] [address]    Report Status PENDING  Incomplete  Urine culture  Status: None   Collection Time: 09/18/19  7:59 AM   Specimen: In/Out Cath Urine  Result Value Ref Range Status   Specimen Description   Final    IN/OUT CATH URINE Performed at [facility], [address], [place], [place] [address]    Special Requests   Final    NONE Performed at [facility], [address] [place], [place] [address]    Culture   Final    NO GROWTH Performed at [facility], [place] [address], [place], [place] [address]    Report Status 09/19/2019 FINAL  Final  SARS Coronavirus 2 [facility] order, Performed in [facility] hospital lab) Nasopharyngeal Nasopharyngeal Swab     Status: None   Collection Time: 09/18/19  8:19 AM   Specimen: Nasopharyngeal Swab  Result Value Ref Range Status   SARS Coronavirus 2 NEGATIVE NEGATIVE Final    Comment: (NOTE) If result is NEGATIVE SARS-CoV-2 target nucleic acids are NOT DETECTED. The SARS-CoV-2 RNA is generally detectable in upper and lower  respiratory specimens during the acute phase of  infection. The lowest  concentration of SARS-CoV-2 viral copies this assay can detect is 250  copies / mL. A negative result does not preclude SARS-CoV-2 infection  and should not be used as the sole basis for treatment or other  patient management decisions.  A negative result may occur with  improper specimen collection / handling, submission of specimen other  than nasopharyngeal swab, presence of viral mutation(s) within the  areas targeted by this assay, and inadequate number of viral copies  (<250 copies / mL). A negative result must be combined with clinical  observations, patient history, and epidemiological information. If result is POSITIVE SARS-CoV-2 target nucleic acids are DETECTED. The SARS-CoV-2 RNA is generally detectable in upper and lower  respiratory specimens dur ing the acute phase of infection.  Positive  results are indicative of active infection with SARS-CoV-2.  Clinical  correlation with patient history and other diagnostic information is  necessary to determine patient infection status.  Positive results do  not rule out bacterial infection or co-infection with other viruses. If result is PRESUMPTIVE POSTIVE SARS-CoV-2 nucleic acids MAY BE PRESENT.   A presumptive positive result was obtained on the submitted specimen  and confirmed on repeat testing.  While 2019 novel coronavirus  (SARS-CoV-2) nucleic acids may be present in the submitted sample  additional confirmatory testing may be necessary for epidemiological  and / or clinical management purposes  to differentiate between  SARS-CoV-2 and other Sarbecovirus currently known to infect humans.  If clinically indicated additional testing with an alternate test  methodology [phone]) is advised. The SARS-CoV-2 RNA is generally  detectable in upper and lower respiratory sp ecimens during the acute  phase of infection. The expected result is Negative. Fact Sheet for Patients:   StrictlyIdeas.no Fact Sheet for Healthcare Providers: BankingDealers.co.za This test is not yet approved or cleared by the [place] FDA and has been authorized for detection and/or diagnosis of SARS-CoV-2 by FDA under an Emergency Use Authorization (EUA).  This EUA will remain in effect (meaning this test can be used) for the duration of the COVID-19 declaration under Section 564(b)(1) of the Act, 21 U.S.C. section 360bbb-3(b)(1), unless the authorization is terminated or revoked sooner. Performed at [facility], [address], [place], [place] [address]   MRSA PCR Screening     Status: None   Collection Time: 09/18/19 11:43 AM   Specimen: Nasal Mucosa; Nasopharyngeal  Result  Value Ref Range Status   MRSA by PCR NEGATIVE NEGATIVE Final    Comment:        The GeneXpert MRSA Assay (FDA approved for NASAL specimens only), is one component of a comprehensive MRSA colonization surveillance program. It is not intended to diagnose MRSA infection nor to guide or monitor treatment for MRSA infections. Performed at [facility], [address] [place], [place] [address]      Scheduled Meds: . bisoprolol  7.5 mg Oral BID  . Chlorhexidine Gluconate Cloth  6 each Topical Daily  . dextrose      . flecainide  75 mg Oral BID  . guaiFENesin  1,200 mg Oral BID  . insulin aspart  0-20 Units Subcutaneous TID WC  . insulin aspart  0-5 Units Subcutaneous QHS  . insulin aspart  5 Units Subcutaneous TID WC  . ipratropium-albuterol  3 mL Nebulization Q4H  . levothyroxine  137 mcg Oral QAC breakfast  . mouth rinse  15 mL Mouth Rinse q12n4p  . mometasone-formoterol  2 puff Inhalation BID  . montelukast  10 mg Oral QHS  . multivitamin with minerals  1 tablet Oral Daily  . pantoprazole  40 mg Oral BID AC  . rivaroxaban  20 mg Oral Q supper  . voriconazole  200 mg Oral BID   Continuous Infusions: . piperacillin-tazobactam (ZOSYN)  IV 3.375 g  (09/22/19 0609)    Procedures/Studies: Ct Head Wo Contrast  Result Date: 09/18/2019 CLINICAL DATA:  Confusion EXAM: CT HEAD WITHOUT CONTRAST TECHNIQUE: Contiguous axial images were obtained from the base of the skull through the vertex without intravenous contrast. COMPARISON:  None. FINDINGS: Brain: No evidence of acute infarction, hemorrhage, hydrocephalus, extra-axial collection or mass lesion/mass effect. Vascular: No hyperdense vessel or unexpected calcification. Skull: Normal. Negative for fracture or focal lesion. Sinuses/Orbits: No acute finding. Other: None. IMPRESSION: No acute intracranial pathology. Electronically Signed   By: Eddie Candle M.D.   On: 09/18/2019 14:53   Portable Chest 1 View  Result Date: 09/19/2019 CLINICAL DATA:  Fever, cough. EXAM: PORTABLE CHEST 1 VIEW COMPARISON:  Radiograph of September 18, 2019. FINDINGS: Stable cardiomediastinal silhouette. Left-sided pacemaker is unchanged in position. No pneumothorax or pleural effusion is noted. Mildly decreased right lung opacity is noted suggesting improving pneumonia. Mild left basilar atelectasis or infiltrate is noted. Bony thorax is unremarkable. IMPRESSION: Mildly decreased right lung opacity is noted suggesting improving pneumonia. Mild left basilar subsegmental atelectasis or infiltrate is noted. Electronically Signed   By: Marijo Conception M.D.   On: 09/19/2019 07:45   Dg Chest Portable 1 View  Result Date: 09/18/2019 CLINICAL DATA:  Code sepsis.  Restless.  Fever. EXAM: PORTABLE CHEST 1 VIEW COMPARISON:  07/07/2019 FINDINGS: Study mildly degraded by motion. Cardiac silhouette normal in size. No mediastinal or hilar masses. Mild hazy opacity is noted in the right mid to lower lung, which is accentuated by respiratory motion. Mild opacity at the left lung base consistent with atelectasis or scarring. Lungs otherwise clear. No convincing effusion. No pneumothorax. Left anterior chest wall sequential pacemaker is stable.  IMPRESSION: 1. Hazy opacity in the right mid to lower lung. This is consistent with pneumonia proper clinical setting. No other evidence of acute cardiopulmonary disease. Electronically Signed   By: Lajean Manes M.D.   On: 09/18/2019 08:55    Orson Eva, DO  [facility] Pager [phone]  If 7PM-7AM, please contact night-coverage [email or web address] Password [facility] 09/22/2019, 8:35 AM   LOS: 4 days

## 2019-09-22 NOTE — Telephone Encounter (Signed)
Xolair Prefilled Syringe Received:  150mg  Prefilled Syringe >> quantity #2, lot # [id number], exp date 05/29/2020 75mg  Prefilled Syringe >> N/A Medication arrival date: 09/22/19 Received by: Riddick Nuon,LPN

## 2019-09-23 DIAGNOSIS — I483 Typical atrial flutter: Secondary | ICD-10-CM

## 2019-09-23 DIAGNOSIS — I480 Paroxysmal atrial fibrillation: Secondary | ICD-10-CM

## 2019-09-23 LAB — MAGNESIUM: Magnesium: 2.2 mg/dL (ref 1.7–2.4)

## 2019-09-23 LAB — CBC WITH DIFFERENTIAL/PLATELET
Abs Immature Granulocytes: 0.04 10*3/uL (ref 0.00–0.07)
Basophils Absolute: 0 10*3/uL (ref 0.0–0.1)
Basophils Relative: 1 %
Eosinophils Absolute: 0.1 10*3/uL (ref 0.0–0.5)
Eosinophils Relative: 2 %
HCT: 52.2 % — ABNORMAL HIGH (ref 39.0–52.0)
Hemoglobin: 16.8 g/dL (ref 13.0–17.0)
Immature Granulocytes: 1 %
Lymphocytes Relative: 24 %
Lymphs Abs: 1.5 10*3/uL (ref 0.7–4.0)
MCH: 30.6 pg (ref 26.0–34.0)
MCHC: 32.2 g/dL (ref 30.0–36.0)
MCV: 95.1 fL (ref 80.0–100.0)
Monocytes Absolute: 0.6 10*3/uL (ref 0.1–1.0)
Monocytes Relative: 10 %
Neutro Abs: 4.1 10*3/uL (ref 1.7–7.7)
Neutrophils Relative %: 62 %
Platelets: 192 10*3/uL (ref 150–400)
RBC: 5.49 MIL/uL (ref 4.22–5.81)
RDW: 13.1 % (ref 11.5–15.5)
WBC: 6.4 10*3/uL (ref 4.0–10.5)
nRBC: 0 % (ref 0.0–0.2)

## 2019-09-23 LAB — COMPREHENSIVE METABOLIC PANEL
ALT: 24 U/L (ref 0–44)
AST: 16 U/L (ref 15–41)
Albumin: 3.2 g/dL — ABNORMAL LOW (ref 3.5–5.0)
Alkaline Phosphatase: 59 U/L (ref 38–126)
Anion gap: 9 (ref 5–15)
BUN: 20 mg/dL (ref 8–23)
CO2: 25 mmol/L (ref 22–32)
Calcium: 8.5 mg/dL — ABNORMAL LOW (ref 8.9–10.3)
Chloride: 106 mmol/L (ref 98–111)
Creatinine, Ser: 0.72 mg/dL (ref 0.61–1.24)
GFR calc Af Amer: >60 mL/min (ref >60)
GFR calc non Af Amer: >60 mL/min (ref >60)
Glucose, Bld: 138 mg/dL — ABNORMAL HIGH (ref 70–99)
Potassium: 3.9 mmol/L (ref 3.5–5.1)
Sodium: 140 mmol/L (ref 135–145)
Total Bilirubin: 1.1 mg/dL (ref 0.3–1.2)
Total Protein: 6.6 g/dL (ref 6.5–8.1)

## 2019-09-23 LAB — CULTURE, BLOOD (ROUTINE X 2)
Culture: NO GROWTH
Culture: NO GROWTH

## 2019-09-23 LAB — GLUCOSE, CAPILLARY
Glucose-Capillary: 142 mg/dL — ABNORMAL HIGH (ref 70–99)
Glucose-Capillary: 136 mg/dL — ABNORMAL HIGH (ref 70–99)
Glucose-Capillary: 190 mg/dL — ABNORMAL HIGH (ref 70–99)
Glucose-Capillary: 103 mg/dL — ABNORMAL HIGH (ref 70–99)
Glucose-Capillary: 156 mg/dL — ABNORMAL HIGH (ref 70–99)

## 2019-09-23 MED ORDER — IPRATROPIUM-ALBUTEROL 0.5-2.5 (3) MG/3ML IN SOLN
3.0000 mL | Freq: Four times a day (QID) | RESPIRATORY_TRACT | Status: DC
  Administered 2019-09-23 – 2019-09-25 (×8): 3 mL via RESPIRATORY_TRACT
  Filled 2019-09-23 (×8): qty 3

## 2019-09-23 MED ORDER — SALINE SPRAY 0.65 % NA SOLN
1.0000 | NASAL | Status: DC | PRN
  Administered 2019-09-23: 1 via NASAL
  Filled 2019-09-23: qty 44

## 2019-09-23 NOTE — Progress Notes (Signed)
Pt complaining of increasing nose bleeds despite humidified oxygen.

## 2019-09-23 NOTE — Progress Notes (Signed)
Subjective: He says he feels a little bit better.  He has no new complaints.  His breathing is doing better.  He did have some more nosebleed.  Heart rate appears to be better controlled this morning.  He is still coughing up some sputum  Objective: Vital signs in last 24 hours: Temp:  [97.8 F (36.6 C)-98.6 F (37 C)] 98.5 F (36.9 C) (09/25 0400) Pulse Rate:  [61-105] 100 (09/25 0600) Resp:  [11-26] 14 (09/25 0600) BP: (115)/(81) 115/81 (09/24 0800) SpO2:  [87 %-95 %] 91 % (09/25 0600) Weight:  [107.9 kg] 107.9 kg (09/25 0500) Weight change: 0.1 kg Last BM Date: 09/22/19  Intake/Output from previous day: 09/24 0701 - 09/25 0700 In: 148.8 [I.V.:66; IV Piggyback:82.8] Out: 300 [Urine:300]  PHYSICAL EXAM General appearance: alert, cooperative and no distress Resp: Chest is much clearer Cardio: Heart is more regular and appears to be paced rhythm by monitor GI: soft, non-tender; bowel sounds normal; no masses,  no organomegaly Extremities: extremities normal, atraumatic, no cyanosis or edema  Lab Results:  Results for orders placed or performed during the hospital encounter of 09/18/19 (from the past 48 hour(s))  Glucose, capillary     Status: Abnormal   Collection Time: 09/21/19  8:04 AM  Result Value Ref Range   Glucose-Capillary 154 (H) 70 - 99 mg/dL  Procalcitonin - Baseline     Status: None   Collection Time: 09/21/19 11:19 AM  Result Value Ref Range   Procalcitonin <0.10 ng/mL    Comment:        Interpretation: PCT (Procalcitonin) <= 0.5 ng/mL: Systemic infection (sepsis) is not likely. Local bacterial infection is possible. (NOTE)       Sepsis PCT Algorithm           Lower Respiratory Tract                                      Infection PCT Algorithm    ----------------------------     ----------------------------         PCT < 0.25 ng/mL                PCT < 0.10 ng/mL         Strongly encourage             Strongly discourage   discontinuation of antibiotics     initiation of antibiotics    ----------------------------     -----------------------------       PCT 0.25 - 0.50 ng/mL            PCT 0.10 - 0.25 ng/mL               OR       >80% decrease in PCT            Discourage initiation of                                            antibiotics      Encourage discontinuation           of antibiotics    ----------------------------     -----------------------------         PCT >= 0.50 ng/mL              PCT 0.26 - 0.50 ng/mL  AND        <80% decrease in PCT             Encourage initiation of                                             antibiotics       Encourage continuation           of antibiotics    ----------------------------     -----------------------------        PCT >= 0.50 ng/mL                  PCT > 0.50 ng/mL               AND         increase in PCT                  Strongly encourage                                      initiation of antibiotics    Strongly encourage escalation           of antibiotics                                     -----------------------------                                           PCT <= 0.25 ng/mL                                                 OR                                        > 80% decrease in PCT                                     Discontinue / Do not initiate                                             antibiotics Performed at [facility] [place], [place] [address]   Glucose, capillary     Status: Abnormal   Collection Time: 09/21/19 11:38 AM  Result Value Ref Range   Glucose-Capillary 220 (H) 70 - 99 mg/dL  Glucose, capillary     Status: Abnormal   Collection Time: 09/21/19  4:11 PM  Result Value Ref Range   Glucose-Capillary 119 (H) 70 - 99 mg/dL   Comment 1 Notify RN    Comment 2 Document in Chart   Glucose, capillary     Status: Abnormal   Collection Time: 09/21/19  9:18 PM  Result Value Ref Range   Glucose-Capillary 122 (H) 70 - 99 mg/dL   Glucose, capillary     Status: Abnormal   Collection Time: 09/22/19  2:49 AM  Result Value Ref Range   Glucose-Capillary 147 (H) 70 - 99 mg/dL  Comprehensive metabolic panel     Status: Abnormal   Collection Time: 09/22/19  4:24 AM  Result Value Ref Range   Sodium 140 135 - 145 mmol/L   Potassium 3.8 3.5 - 5.1 mmol/L   Chloride 110 98 - 111 mmol/L   CO2 26 22 - 32 mmol/L   Glucose, Bld 146 (H) 70 - 99 mg/dL   BUN 22 8 - 23 mg/dL   Creatinine, Ser 0.83 0.61 - 1.24 mg/dL   Calcium 8.3 (L) 8.9 - 10.3 mg/dL   Total Protein 6.3 (L) 6.5 - 8.1 g/dL   Albumin 3.0 (L) 3.5 - 5.0 g/dL   AST 20 15 - 41 U/L   ALT 26 0 - 44 U/L   Alkaline Phosphatase 52 38 - 126 U/L   Total Bilirubin 0.8 0.3 - 1.2 mg/dL   GFR calc non Af Amer >60 >60 mL/min   GFR calc Af Amer >60 >60 mL/min   Anion gap 4 (L) 5 - 15    Comment: Performed at [facility], [address], [place], [place] [address]  CBC WITH DIFFERENTIAL     Status: None   Collection Time: 09/22/19  4:24 AM  Result Value Ref Range   WBC 5.5 4.0 - 10.5 K/uL   RBC 5.13 4.22 - 5.81 MIL/uL   Hemoglobin 15.7 13.0 - 17.0 g/dL   HCT 49.6 39.0 - 52.0 %   MCV 96.7 80.0 - 100.0 fL   MCH 30.6 26.0 - 34.0 pg   MCHC 31.7 30.0 - 36.0 g/dL   RDW 13.3 11.5 - 15.5 %   Platelets 172 150 - 400 K/uL   nRBC 0.0 0.0 - 0.2 %   Neutrophils Relative % 63 %   Neutro Abs 3.5 1.7 - 7.7 K/uL   Lymphocytes Relative 23 %   Lymphs Abs 1.3 0.7 - 4.0 K/uL   Monocytes Relative 11 %   Monocytes Absolute 0.6 0.1 - 1.0 K/uL   Eosinophils Relative 2 %   Eosinophils Absolute 0.1 0.0 - 0.5 K/uL   Basophils Relative 1 %   Basophils Absolute 0.0 0.0 - 0.1 K/uL   Immature Granulocytes 0 %   Abs Immature Granulocytes 0.02 0.00 - 0.07 K/uL    Comment: Performed at [facility], [address], [place], [place] [address]  Glucose, capillary     Status: Abnormal   Collection Time: 09/22/19  8:08 AM  Result Value Ref Range   Glucose-Capillary 164 (H) 70 - 99 mg/dL  Glucose,  capillary     Status: Abnormal   Collection Time: 09/22/19 11:43 AM  Result Value Ref Range   Glucose-Capillary 169 (H) 70 - 99 mg/dL  Glucose, capillary     Status: Abnormal   Collection Time: 09/22/19  4:55 PM  Result Value Ref Range   Glucose-Capillary 103 (H) 70 - 99 mg/dL  Glucose, capillary     Status: Abnormal   Collection Time: 09/22/19  9:54 PM  Result Value Ref Range   Glucose-Capillary 147 (H) 70 - 99 mg/dL  Comprehensive metabolic panel     Status: Abnormal   Collection Time: 09/23/19  4:40 AM  Result Value Ref Range   Sodium 140 135 - 145 mmol/L  Potassium 3.9 3.5 - 5.1 mmol/L   Chloride 106 98 - 111 mmol/L   CO2 25 22 - 32 mmol/L   Glucose, Bld 138 (H) 70 - 99 mg/dL   BUN 20 8 - 23 mg/dL   Creatinine, Ser 0.72 0.61 - 1.24 mg/dL   Calcium 8.5 (L) 8.9 - 10.3 mg/dL   Total Protein 6.6 6.5 - 8.1 g/dL   Albumin 3.2 (L) 3.5 - 5.0 g/dL   AST 16 15 - 41 U/L   ALT 24 0 - 44 U/L   Alkaline Phosphatase 59 38 - 126 U/L   Total Bilirubin 1.1 0.3 - 1.2 mg/dL   GFR calc non Af Amer >60 >60 mL/min   GFR calc Af Amer >60 >60 mL/min   Anion gap 9 5 - 15    Comment: Performed at [facility], [address], [place], [place] [address]  CBC WITH DIFFERENTIAL     Status: Abnormal   Collection Time: 09/23/19  4:40 AM  Result Value Ref Range   WBC 6.4 4.0 - 10.5 K/uL   RBC 5.49 4.22 - 5.81 MIL/uL   Hemoglobin 16.8 13.0 - 17.0 g/dL   HCT 52.2 (H) 39.0 - 52.0 %   MCV 95.1 80.0 - 100.0 fL   MCH 30.6 26.0 - 34.0 pg   MCHC 32.2 30.0 - 36.0 g/dL   RDW 13.1 11.5 - 15.5 %   Platelets 192 150 - 400 K/uL   nRBC 0.0 0.0 - 0.2 %   Neutrophils Relative % 62 %   Neutro Abs 4.1 1.7 - 7.7 K/uL   Lymphocytes Relative 24 %   Lymphs Abs 1.5 0.7 - 4.0 K/uL   Monocytes Relative 10 %   Monocytes Absolute 0.6 0.1 - 1.0 K/uL   Eosinophils Relative 2 %   Eosinophils Absolute 0.1 0.0 - 0.5 K/uL   Basophils Relative 1 %   Basophils Absolute 0.0 0.0 - 0.1 K/uL   Immature Granulocytes 1 %   Abs  Immature Granulocytes 0.04 0.00 - 0.07 K/uL    Comment: Performed at [facility], [address], [place], [place] [address]  Magnesium     Status: None   Collection Time: 09/23/19  4:40 AM  Result Value Ref Range   Magnesium 2.2 1.7 - 2.4 mg/dL    Comment: Performed at [facility], [address] [place], [place] [address]  Glucose, capillary     Status: Abnormal   Collection Time: 09/23/19  4:47 AM  Result Value Ref Range   Glucose-Capillary 142 (H) 70 - 99 mg/dL    ABGS No results for input(s): PHART, PO2ART, TCO2, HCO3 in the last 72 hours.  Invalid input(s): PCO2 CULTURES Recent Results (from the past 240 hour(s))  Culture, blood (Routine x 2)     Status: None   Collection Time: 09/18/19  7:47 AM   Specimen: BLOOD RIGHT HAND  Result Value Ref Range Status   Specimen Description BLOOD RIGHT HAND  Final   Special Requests   Final    BOTTLES DRAWN AEROBIC AND ANAEROBIC Blood Culture results may not be optimal due to an inadequate volume of blood received in culture bottles   Culture   Final    NO GROWTH 5 DAYS Performed at [facility], [address], [place],  [address]    Report Status 09/23/2019 FINAL  Final  Culture, blood (Routine x 2)     Status: None   Collection Time: 09/18/19  7:52 AM   Specimen: Right Antecubital;  Blood  Result Value Ref Range Status   Specimen Description RIGHT ANTECUBITAL  Final   Special Requests   Final    Blood Culture results may not be optimal due to an inadequate volume of blood received in culture bottles BOTTLES DRAWN AEROBIC AND ANAEROBIC   Culture   Final    NO GROWTH 5 DAYS Performed at [facility], [address], [place], [place] [address]    Report Status 09/23/2019 FINAL  Final  Urine culture     Status: None   Collection Time: 09/18/19  7:59 AM   Specimen: In/Out Cath Urine  Result Value Ref Range Status   Specimen Description   Final    IN/OUT CATH URINE Performed at [facility], [address], [place],  [place] [address]    Special Requests   Final    NONE Performed at [facility], [address] [place], [place] [address]    Culture   Final    NO GROWTH Performed at [facility], [place] [address] [place], [place] [address]    Report Status 09/19/2019 FINAL  Final  SARS Coronavirus 2 [facility] order, Performed in [facility] hospital lab) Nasopharyngeal Nasopharyngeal Swab     Status: None   Collection Time: 09/18/19  8:19 AM   Specimen: Nasopharyngeal Swab  Result Value Ref Range Status   SARS Coronavirus 2 NEGATIVE NEGATIVE Final    Comment: (NOTE) If result is NEGATIVE SARS-CoV-2 target nucleic acids are NOT DETECTED. The SARS-CoV-2 RNA is generally detectable in upper and lower  respiratory specimens during the acute phase of infection. The lowest  concentration of SARS-CoV-2 viral copies this assay can detect is 250  copies / mL. A negative result does not preclude SARS-CoV-2 infection  and should not be used as the sole basis for treatment or other  patient management decisions.  A negative result may occur with  improper specimen collection / handling, submission of specimen other  than nasopharyngeal swab, presence of viral mutation(s) within the  areas targeted by this assay, and inadequate number of viral copies  (<250 copies / mL). A negative result must be combined with clinical  observations, patient history, and epidemiological information. If result is POSITIVE SARS-CoV-2 target nucleic acids are DETECTED. The SARS-CoV-2 RNA is generally detectable in upper and lower  respiratory specimens dur ing the acute phase of infection.  Positive  results are indicative of active infection with SARS-CoV-2.  Clinical  correlation with patient history and other diagnostic information is  necessary to determine patient infection status.  Positive results do  not rule out bacterial infection or co-infection with other viruses. If result is PRESUMPTIVE POSTIVE SARS-CoV-2 nucleic  acids MAY BE PRESENT.   A presumptive positive result was obtained on the submitted specimen  and confirmed on repeat testing.  While 2019 novel coronavirus  (SARS-CoV-2) nucleic acids may be present in the submitted sample  additional confirmatory testing may be necessary for epidemiological  and / or clinical management purposes  to differentiate between  SARS-CoV-2 and other Sarbecovirus currently known to infect humans.  If clinically indicated additional testing with an alternate test  methodology [phone]) is advised. The SARS-CoV-2 RNA is generally  detectable in upper and lower respiratory sp ecimens during the acute  phase of infection. The expected result is Negative. Fact Sheet for Patients:  StrictlyIdeas.no Fact Sheet for Healthcare Providers: BankingDealers.co.za This test is not yet approved or cleared by the [place] FDA and has been authorized for  detection and/or diagnosis of SARS-CoV-2 by FDA under an Emergency Use Authorization (EUA).  This EUA will remain in effect (meaning this test can be used) for the duration of the COVID-19 declaration under Section 564(b)(1) of the Act, 21 U.S.C. section 360bbb-3(b)(1), unless the authorization is terminated or revoked sooner. Performed at [facility], [address] [place], [place] [address]   MRSA PCR Screening     Status: None   Collection Time: 09/18/19 11:43 AM   Specimen: Nasal Mucosa; Nasopharyngeal  Result Value Ref Range Status   MRSA by PCR NEGATIVE NEGATIVE Final    Comment:        The GeneXpert MRSA Assay (FDA approved for NASAL specimens only), is one component of a comprehensive MRSA colonization surveillance program. It is not intended to diagnose MRSA infection nor to guide or monitor treatment for MRSA infections. Performed at [facility], [address], [place],  [address]    Studies/Results: No results found.  Medications:  Prior  to Admission:  Facility-Administered Medications Prior to Admission  Medication Dose Route Frequency Provider Last Rate Last Dose  . omalizumab Arvid Right) injection 300 mg  300 mg Subcutaneous Q14 Days [name], MD   300 mg at 02/09/19 1519   Medications Prior to Admission  Medication Sig Dispense Refill Last Dose  . acetaminophen (TYLENOL) 500 MG tablet Take 1,000 mg by mouth 3 (three) times daily.   09/18/2019 at 0415  . Aclidinium Bromide (TUDORZA PRESSAIR) 400 MCG/ACT AEPB 1 puff BID 1 each 2 09/17/2019 at Unknown time  . albuterol (ACCUNEB) 0.63 MG/3ML nebulizer solution Take 3 mLs by nebulization every 4 (four) hours as needed for shortness of breath.     Marland Kitchen albuterol (PROVENTIL HFA;VENTOLIN HFA) 108 (90 Base) MCG/ACT inhaler Inhale 2 puffs every 4 (four) hours as needed into the lungs for shortness of breath (only if you can't catch your breath).     Marland Kitchen amLODipine (NORVASC) 5 MG tablet Take 1 tablet (5 mg total) by mouth daily. 90 tablet 3 09/17/2019 at Unknown time  . bisoprolol (ZEBETA) 5 MG tablet TAKE 1 AND 1/2 TABLET BY MOUTH TWICE DAILY. 90 tablet 3 09/17/2019 at Unknown time  . canagliflozin (INVOKANA) 300 MG TABS tablet Take 300 mg by mouth daily before breakfast.   09/17/2019 at Unknown time  . Carboxymethylcellul-Glycerin (LUBRICATING EYE DROPS OP) Place 1 drop into the right eye daily as needed (dry eyes).   09/17/2019 at Unknown time  . cloNIDine (CATAPRES) 0.1 MG tablet Take 0.1 mg by mouth 2 (two) times daily.   09/17/2019 at Unknown time  . diphenhydramine-acetaminophen (TYLENOL PM) 25-500 MG TABS tablet Take 1 tablet by mouth at bedtime.     Marland Kitchen EPINEPHrine 0.3 mg/0.3 mL IJ SOAJ injection Inject 0.3 mg into the muscle as needed for anaphylaxis.    Past Month at Unknown time  . flecainide (TAMBOCOR) 150 MG tablet Take 0.5 tablets (75 mg total) by mouth 2 (two) times daily. 90 tablet 3 09/17/2019 at Unknown time  . glipiZIDE (GLUCOTROL XL) 5 MG 24 hr tablet Take 5 mg by mouth  daily before breakfast.   09/17/2019 at Unknown time  . guaiFENesin (MUCINEX) 600 MG 12 hr tablet Take 600 mg by mouth 2 (two) times daily.    09/17/2019 at Unknown time  . hydrocortisone cream 1 % Apply 1 application topically daily as needed for itching.     . levothyroxine (SYNTHROID, LEVOTHROID) 137 MCG tablet Take 137 mcg by mouth daily before breakfast.  09/17/2019 at Unknown time  . losartan-hydrochlorothiazide (HYZAAR) 100-25 MG per tablet Take 1 tablet by mouth daily.   09/17/2019 at Unknown time  . metFORMIN (GLUCOPHAGE-XR) 500 MG 24 hr tablet Take 1,000 mg by mouth at bedtime.    09/17/2019 at Unknown time  . montelukast (SINGULAIR) 10 MG tablet TAKE (1) TABLET BY MOUTH AT BEDTIME. 30 tablet 0 09/17/2019 at Unknown time  . Multiple Vitamin (MULTIVITAMIN) tablet Take 1 tablet by mouth daily.     09/17/2019 at Unknown time  . omeprazole (PRILOSEC OTC) 20 MG tablet Take 20 mg by mouth daily.    09/17/2019 at Unknown time  . rivaroxaban (XARELTO) 20 MG TABS tablet Take 20 mg daily with supper by mouth.   09/17/2019 at Unknown time  . voriconazole (VFEND) 200 MG tablet Take 1 tablet (200 mg total) by mouth 2 (two) times daily. 180 tablet 0 09/17/2019 at Unknown time  . WIXELA INHUB 500-50 MCG/DOSE AEPB INHALE 1 PUFF INTO THE LUNGS TWICE DAILY. RINSE MOUTH AFTER USE. 180 each 0 09/17/2019 at Unknown time  . XOLAIR 150 MG/ML prefilled syringe INJECT 300MG  SUBCUTANEOUSLY EVERY 4 WEEKS (GIVEN AT MD  OFFICE) 4 mL 5 Past Month at Unknown time  . Respiratory Therapy Supplies (FLUTTER) DEVI USE AS DIRECTED (Patient not taking: Reported on 09/18/2019) 1 each 0 Not Taking at Unknown time   Scheduled: . amoxicillin-clavulanate  1 tablet Oral Q12H  . bisoprolol  7.5 mg Oral BID  . Chlorhexidine Gluconate Cloth  6 each Topical Daily  . flecainide  75 mg Oral BID  . guaiFENesin  1,200 mg Oral BID  . insulin aspart  0-20 Units Subcutaneous TID WC  . insulin aspart  0-5 Units Subcutaneous QHS  . insulin aspart  5  Units Subcutaneous TID WC  . ipratropium-albuterol  3 mL Nebulization Q4H  . levothyroxine  137 mcg Oral QAC breakfast  . mouth rinse  15 mL Mouth Rinse q12n4p  . mometasone-formoterol  2 puff Inhalation BID  . montelukast  10 mg Oral QHS  . multivitamin with minerals  1 tablet Oral Daily  . pantoprazole  40 mg Oral BID AC  . rivaroxaban  20 mg Oral Q supper  . voriconazole  200 mg Oral BID   Continuous:  ERD:[id number], diphenhydrAMINE, haloperidol lactate, LORazepam, ondansetron **OR** ondansetron (ZOFRAN) IV, sodium chloride, traZODone  Assesment: He was admitted with acute on chronic hypoxic respiratory failure and severe sepsis.  This is related to pneumonia.  He has resolved sepsis pathophysiology.  His pneumonia is improving.  At baseline he has severe persistent asthma on multiple medications.  He has had trouble with atrial fib with RVR this admission.  He has a pacemaker and he had more medication yesterday and his heart rate is better controlled.  I cannot tell from the monitor if he is back in sinus since he had what appeared to be atrial flutter previously  He has sleep apnea intolerant of CPAP  He has diabetes which is doing okay Principal Problem:   Acute on chronic respiratory failure ([place]) Active Problems:   Essential hypertension, benign   Premature ventricular contractions   PPM-St.Jude   PVC's (premature ventricular contractions)   Sinoatrial node dysfunction (HCC)   Persistent atrial fibrillation   Hyperthyroidism   Hyperlipidemia   RBBB (right bundle branch block)   Carotid artery stenosis   Type 2 diabetes mellitus (HCC)   OSA (obstructive sleep apnea)   Severe persistent asthma   CAP (community acquired pneumonia)  Chronic bronchitis (HCC)   Severe sepsis (HCC)   Elevated lactic acid level   GERD (gastroesophageal reflux disease)   Lobar pneumonia (HCC)   Acute respiratory failure with hypoxia (HCC)   Paroxysmal A-fib (HCC)   Atrial fibrillation  with RVR ([place])    Plan: Continue treatments.    LOS: 5 days   Alonza Bogus 09/23/2019, 7:56 AM

## 2019-09-23 NOTE — TOC Initial Note (Addendum)
Transition of Care [facility]) - Initial/Assessment Note    Patient Details  Name: Clarence Dawson MRN: [id number] Date of Birth: 03-02-1946  Transition of Care [facility]) CM/SW Contact:    Boneta Lucks, RN Phone Number: 09/23/2019, 2:27 PM  Clinical Narrative:   Patient admitted for acute/ chronic respiratory failure. Patient needing Home oxygen. Orders placed, RN note complete. Called Melissa with [facility]. Waiting for a call back, planning weekend discharge.  Patient was recently discharge from [place] [facility] completed treatment. Wife wanting to  Have RN/PT active again . Heath with [facility]. She will accept the referral.              Addendum : [facility] did not return call or text.  Called Ashly with [facility], he took the referral and someone deliver tank at discharge.  Expected Discharge Plan: [place] Barriers to Discharge: Continued Medical Work up   Patient Goals and CMS Choice Patient states their goals for this hospitalization and ongoing recovery are:: to go back home with home health. CMS Medicare.gov Compare Post Acute Care list provided to:: Patient Represenative (must comment)(wife)    Expected Discharge Plan and Services Expected Discharge Plan: [place] Choice: [place] arrangements for the past 2 months: [place] Expected Discharge Date: 09/21/19                    HH Arranged: RN, PT [place] Agency: [place] ([place]) Date [place]: 09/23/19 Time [place]: 4010 Representative spoke with at [place]: [place] Arrangements/Services Living arrangements for the past 2 months: [place] Lives with:: Spouse Patient language and need for interpreter reviewed:: Yes Do you feel safe going back to the place where you live?: Yes      Need for Family Participation in Patient Care: Yes (Comment) Care giver  support system in place?: Yes (comment)   Criminal Activity/Legal Involvement Pertinent to Current Situation/Hospitalization: No - Comment as needed  Activities of Daily Living Home Assistive Devices/Equipment: CBG Meter, Contact lenses, Blood pressure cuff, Scales, Shower chair with back, Cane (specify quad or straight) ADL Screening (condition at time of admission) Patient's cognitive ability adequate to safely complete daily activities?: Yes Is the patient deaf or have difficulty hearing?: No Does the patient have difficulty seeing, even when wearing glasses/contacts?: No Does the patient have difficulty concentrating, remembering, or making decisions?: No Patient able to express need for assistance with ADLs?: Yes Does the patient have difficulty dressing or bathing?: Yes Independently performs ADLs?: No Communication: Independent Dressing (OT): Needs assistance Is this a change from baseline?: Change from baseline, expected to last >3 days Grooming: Needs assistance Is this a change from baseline?: Change from baseline, expected to last >3 days Feeding: Needs assistance Is this a change from baseline?: Change from baseline, expected to last <3 days Bathing: Needs assistance Is this a change from baseline?: Change from baseline, expected to last >3 days Toileting: Needs assistance Is this a change from baseline?: Change from baseline, expected to last >3days In/Out Bed: Needs assistance Is this a change from baseline?: Change from baseline, expected to last >3 days Walks in Home: Independent Does the patient have difficulty walking or climbing stairs?: Yes Weakness of Legs: Both Weakness of Arms/Hands: Both  Permission Sought/Granted   Permission granted to share information with : Yes, Verbal Permission Granted  Permission granted to share info w Relationship: Butch Penny - wife     Emotional Assessment         Alcohol / Substance Use: Not Applicable Psych  Involvement: No (comment)  Admission diagnosis:  Healthcare-associated pneumonia [J18.9] Patient Active Problem List   Diagnosis Date Noted  . Paroxysmal A-fib ([place]) 09/22/2019  . Atrial fibrillation with RVR ([place])   . Lobar pneumonia ([place]) 09/21/2019  . Acute respiratory failure with hypoxia ([place]) 09/21/2019  . Acute on chronic respiratory failure ([place]) 09/18/2019  . Severe sepsis ([place])   . Elevated lactic acid level   . GERD (gastroesophageal reflux disease)   . Chronic bronchitis ([place]) 02/09/2019  . CAP (community acquired pneumonia) 03/03/2018  . Primary localized osteoarthritis of right knee 08/11/2017  . Asthma with acute exacerbation 12/08/2016  . Chest pain 10/20/2016  . HTN (hypertension) 10/20/2016  . Morbid obesity due to excess calories ([place]) 06/24/2016  . Severe persistent asthma 02/12/2016  . Upper airway cough syndrome 01/24/2016  . 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 Persistent atrial fibrillation   . Syncope   . Hyperthyroidism   . Hyperlipidemia   . RBBB (right bundle branch block)   . Tricuspid valve disorder   . Aortic valve disorder   . Carotid artery stenosis   . Type 2 diabetes mellitus ([place])   . Pacemaker   . OSA (obstructive sleep apnea)   . Arthritis   . Sinoatrial node dysfunction (HCC)   . PVC's (premature ventricular contractions) 07/17/2011  . Essential hypertension, benign 01/24/2011  . Premature ventricular contractions 01/24/2011  . PPM-[place] 01/24/2011   PCP:  [name], MD Pharmacy:   [facility], [place] [address] Phone: [phone] Fax: [phone]  [facility], [place] [address] [place] [address] Phone: [phone] Fax: [phone]  [facility] ([place], [address] [place] [address] Phone: [phone] Fax: [phone]   Readmission Risk  Interventions No flowsheet data found.

## 2019-09-23 NOTE — Progress Notes (Signed)
Progress Note  Patient Name: Clarence Dawson Date of Encounter: 09/23/2019  Primary Cardiologist: Cristopher Peru, MD  Subjective   Breathing improved.  No chest pain or palpitations.  Slept better last night after Benadryl.  Inpatient Medications    Scheduled Meds: . amoxicillin-clavulanate  1 tablet Oral Q12H  . bisoprolol  7.5 mg Oral BID  . Chlorhexidine Gluconate Cloth  6 each Topical Daily  . flecainide  75 mg Oral BID  . guaiFENesin  1,200 mg Oral BID  . insulin aspart  0-20 Units Subcutaneous TID WC  . insulin aspart  0-5 Units Subcutaneous QHS  . insulin aspart  5 Units Subcutaneous TID WC  . ipratropium-albuterol  3 mL Nebulization Q4H  . levothyroxine  137 mcg Oral QAC breakfast  . mouth rinse  15 mL Mouth Rinse q12n4p  . mometasone-formoterol  2 puff Inhalation BID  . montelukast  10 mg Oral QHS  . multivitamin with minerals  1 tablet Oral Daily  . pantoprazole  40 mg Oral BID AC  . rivaroxaban  20 mg Oral Q supper  . voriconazole  200 mg Oral BID    PRN Meds: bisacodyl, diphenhydrAMINE, haloperidol lactate, LORazepam, ondansetron **OR** ondansetron (ZOFRAN) IV, sodium chloride, traZODone   Vital Signs    Vitals:   09/23/19 0500 09/23/19 0600 09/23/19 0758 09/23/19 0809  BP:      Pulse: 99 100    Resp: 14 14    Temp:    98.1 F (36.7 C)  TempSrc:    Oral  SpO2: 90% 91% 91%   Weight: 107.9 kg     Height:        Intake/Output Summary (Last 24 hours) at 09/23/2019 0844 Last data filed at 09/23/2019 0809 Gross per 24 hour  Intake 148.79 ml  Output 500 ml  Net -351.21 ml   Filed Weights   09/20/19 0500 09/22/19 0500 09/23/19 0500  Weight: 106 kg 107.8 kg 107.9 kg    Telemetry    Atrial flutter with 2:1 block and ventricular pacing.  Personally reviewed.  ECG    An ECG dated 09/23/2019 was personally reviewed today and demonstrated:  Atrial flutter with 2:1 block and ventricular pacing.  Physical Exam   GEN:  Elderly male, seated in bedside  chair.  No acute distress.   Neck: No JVD. Cardiac: RRR, no gallop.  Respiratory:  Coarse breath sounds, no wheezing. GI: Soft, nontender, bowel sounds present. MS: No edema; No deformity. Neuro:  Nonfocal. Psych: Alert and oriented x 3. Normal affect.  Labs    Chemistry Recent Labs  Lab 09/21/19 [phone] 09/22/19 0424 09/23/19 0440  NA 140 140 140  K 4.0 3.8 3.9  CL 109 110 106  CO2 25 26 25   GLUCOSE 160* 146* 138*  BUN 20 22 20   CREATININE 0.67 0.83 0.72  CALCIUM 8.1* 8.3* 8.5*  PROT 6.0* 6.3* 6.6  ALBUMIN 2.9* 3.0* 3.2*  AST 20 20 16   ALT 24 26 24   ALKPHOS 53 52 59  BILITOT 0.7 0.8 1.1  GFRNONAA >60 >60 >60  GFRAA >60 >60 >60  ANIONGAP 6 4* 9     Hematology Recent Labs  Lab 09/21/19 0633 09/22/19 0424 09/23/19 0440  WBC 5.4 5.5 6.4  RBC 5.15 5.13 5.49  HGB 16.1 15.7 16.8  HCT 49.7 49.6 52.2*  MCV 96.5 96.7 95.1  MCH 31.3 30.6 30.6  MCHC 32.4 31.7 32.2  RDW 13.2 13.3 13.1  PLT 162 172 192    Radiology  No results found.  Cardiac Studies   Echocardiogram: 09/2016 Study Conclusions  - Left ventricle: The cavity size was normal. Wall thickness was increased in a pattern of mild LVH. Systolic function was normal. The estimated ejection fraction was in the range of 60% to 65%. Wall motion was normal; there were no regional wall motion abnormalities. Features are consistent with a pseudonormal left ventricular filling pattern, with concomitant abnormal relaxation and increased filling pressure (grade 2 diastolic dysfunction). - Aortic valve: Mildly to moderately calcified annulus. Moderately calcified leaflets. There was mild to moderate stenosis. Valve area (VTI): 1.3 cm^2. - Mitral valve: Calcified annulus. There was trivial regurgitation. - Left atrium: The atrium was mildly to moderately dilated. - Right ventricle: Pacer wire or catheter noted in right ventricle. - Right atrium: Central venous pressure (est): 3 mm Hg. -  Tricuspid valve: There was trivial regurgitation. - Pulmonary arteries: PA peak pressure: 48 mm Hg (S). - Pericardium, extracardiac: A prominent pericardial fat pad was present.  Impressions:  - Mild LVH with LVEF 60-65%. Grade 2 diastolic dysfunction. Mild to moderate left atrial enlargement. Mildly calcified mitral annulus with trivial mitral regurgitation. Moderately calcified aortic valve, leaflet structure difficult to identify. There is evidence of mild to moderate aortic stenosis. Device wire present in the right heart. Trivial tricuspid regurgitation with PASP 48 mmHg.  Patient Profile     73 y.o. male with past medical history of SSS (s/p PPM placement with gen change in 2014), paroxysmal atrial fibrillation, chronic diastolic CHF,HTN, COPD, and GERDwho is being seen for the evaluation ofatrial fibrillation with RVR.  Assessment & Plan    1.  Atrial flutter with RVR, currently 2:1 block and ventricular pacing at around 100 bpm.  He has known PAF and is on flecainide along with Xarelto.  Despite two conversion doses of flecainide, he remains in atrial flutter.  2.  Sick sinus syndrome with pacemaker in place, follows regularly with Dr. Lovena Le.  3.  Sepsis with lobar pneumonia, improving on broad-spectrum antibiotics and followed by Dr. Luan Pulling.  Likely contributing to problem #1.  4.  Essential hypertension, blood pressure stable and he remains on Zebeta.  Other outpatient antihypertensives have been on hold including Norvasc, clonidine, and Hyzaar.  Reviewed interval hospital course and discussed with patient.  He looks to be improving from the perspective of pneumonia and anticipates possibly going home over the weekend per discussion with Dr. Luan Pulling.  As far as his atrial flutter is concerned, I expect he is going to need a TEE cardioversion (missed a dose of Xarelto).  If he were to stay in the hospital over the weekend requiring further treatment for his  pneumonia, this could be scheduled for early next week (in that case keep him n.p.o. on Sunday night).  On the other hand, we will go ahead and schedule an atrial fibrillation clinic visit assuming that he does go home over the weekend and his TEE cardioversion can be scheduled as an outpatient.  Would continue Zebeta, flecainide, and Xarelto.  He will need to continue follow-up with Dr. Lovena Le.  Signed, Rozann Lesches, MD  09/23/2019, 8:44 AM

## 2019-09-23 NOTE — Progress Notes (Signed)
DME Choices:  [facility] [address] [place], [place]  [phone]  [facility] [address] [place], [place] [address] [phone]  [facility] [address] [place], [place] [address] [phone]  [facility] [address] [place], [place] [address] [phone]  [facility] [address] [place], [place] Other Locations available [phone]   

## 2019-09-23 NOTE — Progress Notes (Signed)
    A follow-up appointment with the [facility] has been arranged for 09/30/2019 if he is stable for discharge over the weekend as DCCV can be arranged at that time if he has not converted back to NSR.   Signed, Erma Heritage, PA-C 09/23/2019, 9:32 AM Pager: [phone]

## 2019-09-23 NOTE — Care Management Important Message (Signed)
Important Message  Patient Details  Name: Clarence Dawson MRN: [id number] Date of Birth: 06-30-1946   Medicare Important Message Given:  Yes     Tommy Medal 09/23/2019, 2:06 PM

## 2019-09-23 NOTE — Progress Notes (Signed)
PROGRESS NOTE  Clarence Dawson [id number] DOB: 1946/03/06 DOA: 09/18/2019 PCP: [name], MD   Brief History: 73 year old male with a history of COPD, allergic bronchopulmonary aspergillosis, severe persistent asthma, paroxysmal atrial fibrillation, sinus node dysfunction status post PPM, hypothyroidism, hyperlipidemia presented with fevers and confusion. In emergency department, the patient was noted to have temperature of 101.8 F with lactic acid 4.2. Patient hadoxygen saturation 85% on room air. Chest x-ray showed left basilar opacity/infiltrate. Patient was started on intravenous Zosyn and vancomycin initially. Pulmonary medicine was consulted to assist with management.  Patient developed atrial fibrillation with soft BPs.  As pt is normally in sinus rhythm, cardiology was consulted to assist with management.  Assessment/Plan: Acute respiratory failure with hypoxia -Secondary to pneumonia in setting of asthma and ABPA -presently stable on 3L -ambulatory pulse ox--desaturated <88% -will need home oxygen  Severe sepsis -Present on admission -Presented with fever up to 101.8 F with lactic acid 4.2 -Continue IV Zosyn>>>amox/clav -09/18/2019 blood cultures negative -Urinalysis negative for pyuria -Personally reviewed chest x-ray--R>L basilar opacity -sepsis physiology resolved  Lobar pneumonia -Continue Zosyn>>>amox/clav -Check procalcitonin <0.10  Paroxysmal atrial fibrillationwith RVR -continue flecainide -remains in afib despite 2 bolus/cardioversion doses of flecainide -continue bisoprolol -BP soft with elevated HR -consult cardiology-appreciate recommendation -remains in afib although rate remains suboptimally controlled -Continue rivaroxaban  Essential Hypertension -d/c clonidine for now to allow for margin for BP -holding losartan/hctz -continue bisoprolol  Diabetes mellitus type 2 -Continue NovoLog sliding scale -Continue NovoLog 5  units with meals -holding invokana, glipizide, metformin -09/18/19 A1C--7.2  Hypothyroidism -Continue Synthroid  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  Denies f/c, cp, n/v/d, abd pain.  No cough or hemoptysis.  Objective: Vitals:   09/23/19 1000 09/23/19 1121 09/23/19 1147 09/23/19 1650  BP:      Pulse: (!) 103     Resp: 17     Temp:   98.5 F (36.9 C) 98.5 F (36.9 C)  TempSrc:   Oral Oral  SpO2: 92% 92%    Weight:      Height:        Intake/Output Summary (Last 24 hours) at 09/23/2019 1717 Last data filed at 09/23/2019 0809 Gross per 24 hour  Intake -  Output 500 ml  Net -500 ml   Weight change: 0.1 kg Exam:   General:  Pt is alert, follows commands appropriately, not in acute distress  HEENT: No icterus, No thrush, No neck mass, [place]/AT  Cardiovascular: IRRR, S1/S2, no rubs, no gallops  Respiratory: Left basilar crackles, no wheeze  Abdomen: Soft/+BS, non tender, non distended, no guarding  Extremities: 1 + LE edema, No lymphangitis, No petechiae, No rashes, no synovitis   Data Reviewed: I have personally reviewed following labs and imaging studies Basic Metabolic Panel: Recent Labs  Lab 09/19/19 0455 09/20/19 0411 09/21/19 0633 09/22/19 0424 09/23/19 0440  NA 138 140 140 140 140  K 4.2 4.6 4.0 3.8 3.9  CL 108 109 109 110 106  CO2 20* 25 25 26 25   GLUCOSE 150* 161* 160* 146* 138*  BUN 27* 34* 20 22 20   CREATININE 0.84 0.96 0.67 0.83 0.72  CALCIUM 8.0* 8.2* 8.1* 8.3* 8.5*  MG 2.2 2.4 2.3  --  2.2    Liver Function Tests: Recent Labs  Lab 09/19/19 0455 09/20/19 0411 09/21/19 0633 09/22/19 0424 09/23/19 0440  AST 19 18 20 20 16   ALT 23 21 24 26 24   ALKPHOS 72 58 53 52 59  BILITOT 1.0 0.6 0.7 0.8 1.1  PROT 6.1* 6.1* 6.0* 6.3* 6.6  ALBUMIN 2.9* 2.8* 2.9* 3.0* 3.2*   No results for input(s): LIPASE, AMYLASE in the last 168 hours. No results for input(s): AMMONIA in the last 168 hours. Coagulation Profile: Recent Labs  Lab 09/18/19 0747  INR 1.5*   CBC: Recent Labs  Lab 09/19/19 0455 09/20/19 0411 09/21/19 0633 09/22/19 0424 09/23/19 0440  WBC 8.0 7.1 5.4 5.5 6.4  NEUTROABS 6.9 5.2 3.4 3.5 4.1  HGB 16.2 15.2 16.1 15.7 16.8  HCT 49.8 47.7 49.7 49.6 52.2*  MCV 95.2 96.4 96.5 96.7 95.1  PLT 179 183 162 172 192   Cardiac Enzymes: No results for input(s): CKTOTAL, CKMB, CKMBINDEX, TROPONINI in the last 168 hours. BNP: Invalid input(s): POCBNP CBG: Recent Labs  Lab 09/22/19 2154 09/23/19 0447 09/23/19 0813 09/23/19 1151 09/23/19 1641  GLUCAP 147* 142* 136* 190* 103*   HbA1C: No results for input(s): HGBA1C in the last 72 hours. Urine analysis:    Component Value Date/Time   COLORURINE YELLOW 09/18/2019 [place] 09/18/2019 0747   LABSPEC 1.021 09/18/2019 0747   PHURINE 6.0 09/18/2019 0747   GLUCOSEU >=500 (A) 09/18/2019 0747   HGBUR NEGATIVE 09/18/2019 0747   BILIRUBINUR NEGATIVE 09/18/2019 0747   KETONESUR 5 (A) 09/18/2019 0747   PROTEINUR 30 (A) 09/18/2019 0747   UROBILINOGEN 1.0 11/15/2012 1051   NITRITE NEGATIVE 09/18/2019 0747   LEUKOCYTESUR NEGATIVE 09/18/2019 0747   Sepsis Labs: @LABRCNTIP (procalcitonin:4,lacticidven:4) ) Recent Results (from the past 240 hour(s))  Culture, blood (Routine x 2)     Status: None   Collection Time: 09/18/19  7:47 AM   Specimen: BLOOD RIGHT HAND  Result Value Ref Range Status   Specimen Description BLOOD RIGHT HAND  Final   Special Requests   Final    BOTTLES DRAWN AEROBIC AND ANAEROBIC  Blood Culture results may not be optimal due to an inadequate volume of blood received in culture bottles   Culture   Final    NO GROWTH 5 DAYS Performed at [facility], [address] [place], [place] [address]    Report Status 09/23/2019 FINAL  Final  Culture, blood (Routine x 2)     Status: None   Collection Time: 09/18/19  7:52 AM   Specimen: Right Antecubital; Blood  Result Value Ref Range Status   Specimen Description RIGHT ANTECUBITAL  Final   Special Requests   Final    Blood Culture results may not be optimal due to an inadequate volume of blood received in culture bottles BOTTLES DRAWN AEROBIC AND ANAEROBIC   Culture   Final    NO GROWTH 5 DAYS Performed at [facility], [address], [place], [place] [address]    Report Status 09/23/2019 FINAL  Final  Urine culture     Status: None   Collection Time: 09/18/19  7:59 AM   Specimen: In/Out Cath Urine  Result Value Ref Range Status   Specimen Description   Final    IN/OUT CATH URINE Performed at [facility], [address],  [place], [place] [address]    Special Requests   Final    NONE Performed at [facility], [address], [place], [place] [address]    Culture   Final    NO GROWTH Performed at [facility], [place] [address] [place], [place] [address]    Report Status 09/19/2019 FINAL  Final  SARS Coronavirus 2 [facility] order, Performed in [facility] hospital lab) Nasopharyngeal Nasopharyngeal Swab     Status: None   Collection Time: 09/18/19  8:19 AM   Specimen: Nasopharyngeal Swab  Result Value Ref Range Status   SARS Coronavirus 2 NEGATIVE NEGATIVE Final    Comment: (NOTE) If result is NEGATIVE SARS-CoV-2 target nucleic acids are NOT DETECTED. The SARS-CoV-2 RNA is generally detectable in upper and lower  respiratory specimens during the acute phase of infection. The lowest  concentration of SARS-CoV-2 viral copies this assay can detect is 250  copies / mL. A negative result does not preclude  SARS-CoV-2 infection  and should not be used as the sole basis for treatment or other  patient management decisions.  A negative result may occur with  improper specimen collection / handling, submission of specimen other  than nasopharyngeal swab, presence of viral mutation(s) within the  areas targeted by this assay, and inadequate number of viral copies  (<250 copies / mL). A negative result must be combined with clinical  observations, patient history, and epidemiological information. If result is POSITIVE SARS-CoV-2 target nucleic acids are DETECTED. The SARS-CoV-2 RNA is generally detectable in upper and lower  respiratory specimens dur ing the acute phase of infection.  Positive  results are indicative of active infection with SARS-CoV-2.  Clinical  correlation with patient history and other diagnostic information is  necessary to determine patient infection status.  Positive results do  not rule out bacterial infection or co-infection with other viruses. If result is PRESUMPTIVE POSTIVE SARS-CoV-2 nucleic acids MAY BE PRESENT.   A presumptive positive result was obtained on the submitted specimen  and confirmed on repeat testing.  While 2019 novel coronavirus  (SARS-CoV-2) nucleic acids may be present in the submitted sample  additional confirmatory testing may be necessary for epidemiological  and / or clinical management purposes  to differentiate between  SARS-CoV-2 and other Sarbecovirus currently known to infect humans.  If clinically indicated additional testing with an alternate test  methodology [phone]) is advised. The SARS-CoV-2 RNA is generally  detectable in upper and lower respiratory sp ecimens during the acute  phase of infection. The expected result is Negative. Fact Sheet for Patients:  StrictlyIdeas.no Fact Sheet for Healthcare Providers: BankingDealers.co.za This test is not yet approved or cleared by the  [place] FDA and has been authorized for detection and/or diagnosis of SARS-CoV-2 by FDA under an Emergency Use Authorization (EUA).  This EUA will remain in effect (meaning this test can be used) for the duration of the COVID-19 declaration under Section 564(b)(1) of the Act, 21 U.S.C. section 360bbb-3(b)(1), unless the authorization is terminated or revoked sooner. Performed at [facility], [address] [place], [place] [address]   MRSA PCR Screening     Status: None   Collection Time: 09/18/19 11:43 AM   Specimen: Nasal Mucosa; Nasopharyngeal  Result Value Ref Range Status   MRSA by PCR NEGATIVE NEGATIVE Final    Comment:        The GeneXpert MRSA Assay (FDA approved for NASAL specimens only), is one component of a comprehensive MRSA colonization surveillance program.  It is not intended to diagnose MRSA infection nor to guide or monitor treatment for MRSA infections. Performed at [facility], [address], [place], [place] [address]      Scheduled Meds: . amoxicillin-clavulanate  1 tablet Oral Q12H  . bisoprolol  7.5 mg Oral BID  . Chlorhexidine Gluconate Cloth  6 each Topical Daily  . flecainide  75 mg Oral BID  . guaiFENesin  1,200 mg Oral BID  . insulin aspart  0-20 Units Subcutaneous TID WC  . insulin aspart  0-5 Units Subcutaneous QHS  . insulin aspart  5 Units Subcutaneous TID WC  . ipratropium-albuterol  3 mL Nebulization Q6H  . levothyroxine  137 mcg Oral QAC breakfast  . mouth rinse  15 mL Mouth Rinse q12n4p  . mometasone-formoterol  2 puff Inhalation BID  . montelukast  10 mg Oral QHS  . multivitamin with minerals  1 tablet Oral Daily  . pantoprazole  40 mg Oral BID AC  . rivaroxaban  20 mg Oral Q supper  . voriconazole  200 mg Oral BID   Continuous Infusions:  Procedures/Studies: Ct Head Wo Contrast  Result Date: 09/18/2019 CLINICAL DATA:  Confusion EXAM: CT HEAD WITHOUT CONTRAST TECHNIQUE: Contiguous axial images were obtained from the base  of the skull through the vertex without intravenous contrast. COMPARISON:  None. FINDINGS: Brain: No evidence of acute infarction, hemorrhage, hydrocephalus, extra-axial collection or mass lesion/mass effect. Vascular: No hyperdense vessel or unexpected calcification. Skull: Normal. Negative for fracture or focal lesion. Sinuses/Orbits: No acute finding. Other: None. IMPRESSION: No acute intracranial pathology. Electronically Signed   By: Eddie Candle M.D.   On: 09/18/2019 14:53   Portable Chest 1 View  Result Date: 09/19/2019 CLINICAL DATA:  Fever, cough. EXAM: PORTABLE CHEST 1 VIEW COMPARISON:  Radiograph of September 18, 2019. FINDINGS: Stable cardiomediastinal silhouette. Left-sided pacemaker is unchanged in position. No pneumothorax or pleural effusion is noted. Mildly decreased right lung opacity is noted suggesting improving pneumonia. Mild left basilar atelectasis or infiltrate is noted. Bony thorax is unremarkable. IMPRESSION: Mildly decreased right lung opacity is noted suggesting improving pneumonia. Mild left basilar subsegmental atelectasis or infiltrate is noted. Electronically Signed   By: Marijo Conception M.D.   On: 09/19/2019 07:45   Dg Chest Portable 1 View  Result Date: 09/18/2019 CLINICAL DATA:  Code sepsis.  Restless.  Fever. EXAM: PORTABLE CHEST 1 VIEW COMPARISON:  07/07/2019 FINDINGS: Study mildly degraded by motion. Cardiac silhouette normal in size. No mediastinal or hilar masses. Mild hazy opacity is noted in the right mid to lower lung, which is accentuated by respiratory motion. Mild opacity at the left lung base consistent with atelectasis or scarring. Lungs otherwise clear. No convincing effusion. No pneumothorax. Left anterior chest wall sequential pacemaker is stable. IMPRESSION: 1. Hazy opacity in the right mid to lower lung. This is consistent with pneumonia proper clinical setting. No other evidence of acute cardiopulmonary disease. Electronically Signed   By: Lajean Manes  M.D.   On: 09/18/2019 08:55    Orson Eva, DO  [facility] Pager [phone]  If 7PM-7AM, please contact night-coverage [email or web address] Password [email or web address] 09/23/2019, 5:17 PM   LOS: 5 days

## 2019-09-23 NOTE — Progress Notes (Signed)
Home Health Care Choices:  [place] [phone]  [place] my Favorites Quality of Patient Care Rating 4 out of 5 stars Patient Survey Summary Rating 4 out of 5 stars [facility] [phone]  [place] my Favorites Quality of Patient Care Rating 3 out of 5 stars Patient Survey Summary Rating 5 out of [place] [phone]  [place] my Favorites Quality of Patient Care Rating 3 out of 5 stars Patient Survey Summary Rating 4 out of [place] [phone]  [place] my Favorites Quality of Patient Care Rating 3  out of 5 stars Patient Survey Summary Rating 4 out of [place] [phone]) [phone]  Add [facility] my Favorites Quality of Patient Care Rating 4  out of 5 stars Patient Survey Summary Rating 3 out of 5 stars [place] [phone]  Add [facility] my Favorites Quality of Patient Care Rating 4 out of 5 stars Patient Survey Summary Rating 4 out of 5 stars [place] [phone]  [place], [facility]to my Favorites Quality of Patient Care Rating 4 out of 5 stars Patient Survey Summary Rating 4 out of 5 stars [place] [phone]  [place] my Favorites Quality of Patient Care Rating 4 out of 5 stars Patient Survey Summary Rating 4 out of 5 stars [place] AGE [phone]  [place] my Favorites Quality of Patient Care Rating 3 out of 5 stars Patient Survey Summary Rating 3 out of 5 stars [facility] [phone]  Add [facility] [place] my Favorites Quality of Patient Care Rating 3  out of 5 stars Patient Survey Summary Rating 4 out of 5 stars [place] [phone]  [place] my Favorites Quality of Patient Care Rating 3 out of 5 stars Patient Survey Summary Rating 4 out of 5 stars [facility] ([phone]) [phone]  Add [facility] my Favorites Quality of Patient Care Rating 3  out of 5 stars Patient Survey Summary Rating 3 out of 5 stars [place] [phone]  Add [facility] my Favorites Quality of Patient Care Rating 3  out of 5 stars [place] [phone]  [place] my Favorites Quality of Patient Care Rating

## 2019-09-23 NOTE — Progress Notes (Addendum)
SATURATION QUALIFICATIONS: (This note is used to comply with regulatory documentation for home oxygen)  Patient Saturations on Room Air at Rest = 87  Patient Saturations on Room Air while Ambulating = 86  Patient Saturations on 3 Liters of oxygen while Ambulating = 90

## 2019-09-24 LAB — CBC WITH DIFFERENTIAL/PLATELET
Abs Immature Granulocytes: 0.03 10*3/uL (ref 0.00–0.07)
Basophils Absolute: 0 10*3/uL (ref 0.0–0.1)
Basophils Relative: 1 %
Eosinophils Absolute: 0.1 10*3/uL (ref 0.0–0.5)
Eosinophils Relative: 2 %
HCT: 51.3 % (ref 39.0–52.0)
Hemoglobin: 16.9 g/dL (ref 13.0–17.0)
Immature Granulocytes: 1 %
Lymphocytes Relative: 21 %
Lymphs Abs: 1.3 10*3/uL (ref 0.7–4.0)
MCH: 31.1 pg (ref 26.0–34.0)
MCHC: 32.9 g/dL (ref 30.0–36.0)
MCV: 94.3 fL (ref 80.0–100.0)
Monocytes Absolute: 0.7 10*3/uL (ref 0.1–1.0)
Monocytes Relative: 11 %
Neutro Abs: 4.2 10*3/uL (ref 1.7–7.7)
Neutrophils Relative %: 64 %
Platelets: 194 10*3/uL (ref 150–400)
RBC: 5.44 MIL/uL (ref 4.22–5.81)
RDW: 12.8 % (ref 11.5–15.5)
WBC: 6.4 10*3/uL (ref 4.0–10.5)
nRBC: 0 % (ref 0.0–0.2)

## 2019-09-24 LAB — GLUCOSE, CAPILLARY
Glucose-Capillary: 140 mg/dL — ABNORMAL HIGH (ref 70–99)
Glucose-Capillary: 153 mg/dL — ABNORMAL HIGH (ref 70–99)
Glucose-Capillary: 224 mg/dL — ABNORMAL HIGH (ref 70–99)
Glucose-Capillary: 65 mg/dL — ABNORMAL LOW (ref 70–99)
Glucose-Capillary: 214 mg/dL — ABNORMAL HIGH (ref 70–99)

## 2019-09-24 LAB — BASIC METABOLIC PANEL
Anion gap: 5 (ref 5–15)
BUN: 19 mg/dL (ref 8–23)
CO2: 28 mmol/L (ref 22–32)
Calcium: 8.6 mg/dL — ABNORMAL LOW (ref 8.9–10.3)
Chloride: 106 mmol/L (ref 98–111)
Creatinine, Ser: 0.63 mg/dL (ref 0.61–1.24)
GFR calc Af Amer: >60 mL/min (ref >60)
GFR calc non Af Amer: >60 mL/min (ref >60)
Glucose, Bld: 136 mg/dL — ABNORMAL HIGH (ref 70–99)
Potassium: 3.8 mmol/L (ref 3.5–5.1)
Sodium: 139 mmol/L (ref 135–145)

## 2019-09-24 MED ORDER — POTASSIUM CHLORIDE CRYS ER 20 MEQ PO TBCR
20.0000 meq | EXTENDED_RELEASE_TABLET | Freq: Once | ORAL | Status: AC
  Administered 2019-09-24: 20 meq via ORAL
  Filled 2019-09-24: qty 1

## 2019-09-24 MED ORDER — TRAMADOL HCL 50 MG PO TABS: 50.0000 mg | ORAL_TABLET | Freq: Two times a day (BID) | ORAL | Status: DC | PRN

## 2019-09-24 MED ORDER — DILTIAZEM HCL 30 MG PO TABS
30.0000 mg | ORAL_TABLET | Freq: Four times a day (QID) | ORAL | Status: DC
  Administered 2019-09-24 – 2019-09-25 (×5): 30 mg via ORAL
  Filled 2019-09-24 (×5): qty 1

## 2019-09-24 MED ORDER — ACETAMINOPHEN 325 MG PO TABS: 650.0000 mg | ORAL_TABLET | Freq: Four times a day (QID) | ORAL | Status: DC | PRN

## 2019-09-24 NOTE — Progress Notes (Signed)
Subjective: He says he feels okay.  He is coughing up some darker sputum now.  He is not as short of breath.  He remains in what appears to be atrial fib with a heart rate of about 100-110  Objective: Vital signs in last 24 hours: Temp:  [97.6 F (36.4 C)-98.5 F (36.9 C)] 98.3 F (36.8 C) (09/26 0755) Pulse Rate:  [94-111] 97 (09/26 0755) Resp:  [7-25] 11 (09/26 0755) BP: (135)/(98) 135/98 (09/26 0320) SpO2:  [89 %-97 %] 96 % (09/26 0757) Weight change:  Last BM Date: 09/22/19  Intake/Output from previous day: 09/25 0701 - 09/26 0700 In: -  Out: 1300 [Urine:1300]  PHYSICAL EXAM General appearance: alert, cooperative and no distress Resp: rhonchi bilaterally and wheezes bilaterally Cardio: Heart is irregular with rate around 100 GI: soft, non-tender; bowel sounds normal; no masses,  no organomegaly Extremities: extremities normal, atraumatic, no cyanosis or edema  Lab Results:  Results for orders placed or performed during the hospital encounter of 09/18/19 (from the past 48 hour(s))  Glucose, capillary     Status: Abnormal   Collection Time: 09/22/19 11:43 AM  Result Value Ref Range   Glucose-Capillary 169 (H) 70 - 99 mg/dL  Glucose, capillary     Status: Abnormal   Collection Time: 09/22/19  4:55 PM  Result Value Ref Range   Glucose-Capillary 103 (H) 70 - 99 mg/dL  Glucose, capillary     Status: Abnormal   Collection Time: 09/22/19  9:54 PM  Result Value Ref Range   Glucose-Capillary 147 (H) 70 - 99 mg/dL  Comprehensive metabolic panel     Status: Abnormal   Collection Time: 09/23/19  4:40 AM  Result Value Ref Range   Sodium 140 135 - 145 mmol/L   Potassium 3.9 3.5 - 5.1 mmol/L   Chloride 106 98 - 111 mmol/L   CO2 25 22 - 32 mmol/L   Glucose, Bld 138 (H) 70 - 99 mg/dL   BUN 20 8 - 23 mg/dL   Creatinine, Ser 0.72 0.61 - 1.24 mg/dL   Calcium 8.5 (L) 8.9 - 10.3 mg/dL   Total Protein 6.6 6.5 - 8.1 g/dL   Albumin 3.2 (L) 3.5 - 5.0 g/dL   AST 16 15 - 41 U/L   ALT  24 0 - 44 U/L   Alkaline Phosphatase 59 38 - 126 U/L   Total Bilirubin 1.1 0.3 - 1.2 mg/dL   GFR calc non Af Amer >60 >60 mL/min   GFR calc Af Amer >60 >60 mL/min   Anion gap 9 5 - 15    Comment: Performed at [facility], [address], [place], [place] [address]  CBC WITH DIFFERENTIAL     Status: Abnormal   Collection Time: 09/23/19  4:40 AM  Result Value Ref Range   WBC 6.4 4.0 - 10.5 K/uL   RBC 5.49 4.22 - 5.81 MIL/uL   Hemoglobin 16.8 13.0 - 17.0 g/dL   HCT 52.2 (H) 39.0 - 52.0 %   MCV 95.1 80.0 - 100.0 fL   MCH 30.6 26.0 - 34.0 pg   MCHC 32.2 30.0 - 36.0 g/dL   RDW 13.1 11.5 - 15.5 %   Platelets 192 150 - 400 K/uL   nRBC 0.0 0.0 - 0.2 %   Neutrophils Relative % 62 %   Neutro Abs 4.1 1.7 - 7.7 K/uL   Lymphocytes Relative 24 %   Lymphs Abs 1.5 0.7 - 4.0 K/uL   Monocytes Relative 10 %   Monocytes Absolute  0.6 0.1 - 1.0 K/uL   Eosinophils Relative 2 %   Eosinophils Absolute 0.1 0.0 - 0.5 K/uL   Basophils Relative 1 %   Basophils Absolute 0.0 0.0 - 0.1 K/uL   Immature Granulocytes 1 %   Abs Immature Granulocytes 0.04 0.00 - 0.07 K/uL    Comment: Performed at [facility], [address] [place], [place] [address]  Magnesium     Status: None   Collection Time: 09/23/19  4:40 AM  Result Value Ref Range   Magnesium 2.2 1.7 - 2.4 mg/dL    Comment: Performed at [facility], [address] [place], [place] [address]  Glucose, capillary     Status: Abnormal   Collection Time: 09/23/19  4:47 AM  Result Value Ref Range   Glucose-Capillary 142 (H) 70 - 99 mg/dL  Glucose, capillary     Status: Abnormal   Collection Time: 09/23/19  8:13 AM  Result Value Ref Range   Glucose-Capillary 136 (H) 70 - 99 mg/dL  Glucose, capillary     Status: Abnormal   Collection Time: 09/23/19 11:51 AM  Result Value Ref Range   Glucose-Capillary 190 (H) 70 - 99 mg/dL  Glucose, capillary     Status: Abnormal   Collection Time: 09/23/19  4:41 PM  Result Value Ref Range   Glucose-Capillary 103 (H)  70 - 99 mg/dL  Glucose, capillary     Status: Abnormal   Collection Time: 09/23/19  9:25 PM  Result Value Ref Range   Glucose-Capillary 156 (H) 70 - 99 mg/dL   Comment 1 Notify RN    Comment 2 Document in Chart   Glucose, capillary     Status: Abnormal   Collection Time: 09/24/19  3:13 AM  Result Value Ref Range   Glucose-Capillary 140 (H) 70 - 99 mg/dL  CBC WITH DIFFERENTIAL     Status: None   Collection Time: 09/24/19  4:49 AM  Result Value Ref Range   WBC 6.4 4.0 - 10.5 K/uL   RBC 5.44 4.22 - 5.81 MIL/uL   Hemoglobin 16.9 13.0 - 17.0 g/dL   HCT 51.3 39.0 - 52.0 %   MCV 94.3 80.0 - 100.0 fL   MCH 31.1 26.0 - 34.0 pg   MCHC 32.9 30.0 - 36.0 g/dL   RDW 12.8 11.5 - 15.5 %   Platelets 194 150 - 400 K/uL   nRBC 0.0 0.0 - 0.2 %   Neutrophils Relative % 64 %   Neutro Abs 4.2 1.7 - 7.7 K/uL   Lymphocytes Relative 21 %   Lymphs Abs 1.3 0.7 - 4.0 K/uL   Monocytes Relative 11 %   Monocytes Absolute 0.7 0.1 - 1.0 K/uL   Eosinophils Relative 2 %   Eosinophils Absolute 0.1 0.0 - 0.5 K/uL   Basophils Relative 1 %   Basophils Absolute 0.0 0.0 - 0.1 K/uL   Immature Granulocytes 1 %   Abs Immature Granulocytes 0.03 0.00 - 0.07 K/uL    Comment: Performed at [facility], [address] [place], [place] [address]  Basic metabolic panel     Status: Abnormal   Collection Time: 09/24/19  4:49 AM  Result Value Ref Range   Sodium 139 135 - 145 mmol/L   Potassium 3.8 3.5 - 5.1 mmol/L   Chloride 106 98 - 111 mmol/L   CO2 28 22 - 32 mmol/L   Glucose, Bld 136 (H) 70 - 99 mg/dL   BUN 19 8 - 23 mg/dL   Creatinine, Ser 0.63 0.61 -  1.24 mg/dL   Calcium 8.6 (L) 8.9 - 10.3 mg/dL   GFR calc non Af Amer >60 >60 mL/min   GFR calc Af Amer >60 >60 mL/min   Anion gap 5 5 - 15    Comment: Performed at [facility], [address] [place], [place] [address]  Glucose, capillary     Status: Abnormal   Collection Time: 09/24/19  7:54 AM  Result Value Ref Range   Glucose-Capillary 153 (H) 70 - 99 mg/dL     ABGS No results for input(s): PHART, PO2ART, TCO2, HCO3 in the last 72 hours.  Invalid input(s): PCO2 CULTURES Recent Results (from the past 240 hour(s))  Culture, blood (Routine x 2)     Status: None   Collection Time: 09/18/19  7:47 AM   Specimen: BLOOD RIGHT HAND  Result Value Ref Range Status   Specimen Description BLOOD RIGHT HAND  Final   Special Requests   Final    BOTTLES DRAWN AEROBIC AND ANAEROBIC Blood Culture results may not be optimal due to an inadequate volume of blood received in culture bottles   Culture   Final    NO GROWTH 5 DAYS Performed at [facility], [address], [place], [place] [address]    Report Status 09/23/2019 FINAL  Final  Culture, blood (Routine x 2)     Status: None   Collection Time: 09/18/19  7:52 AM   Specimen: Right Antecubital; Blood  Result Value Ref Range Status   Specimen Description RIGHT ANTECUBITAL  Final   Special Requests   Final    Blood Culture results may not be optimal due to an inadequate volume of blood received in culture bottles BOTTLES DRAWN AEROBIC AND ANAEROBIC   Culture   Final    NO GROWTH 5 DAYS Performed at [facility], [address], [place], [place] [address]    Report Status 09/23/2019 FINAL  Final  Urine culture     Status: None   Collection Time: 09/18/19  7:59 AM   Specimen: In/Out Cath Urine  Result Value Ref Range Status   Specimen Description   Final    IN/OUT CATH URINE Performed at [facility], [address], [place], [place] [address]    Special Requests   Final    NONE Performed at [facility], [address], [place], [place] [address]    Culture   Final    NO GROWTH Performed at [facility], [place] [address] [place],  [address]    Report Status 09/19/2019 FINAL  Final  SARS Coronavirus 2 [facility] order, Performed in [facility] hospital lab) Nasopharyngeal Nasopharyngeal Swab     Status: None   Collection Time: 09/18/19  8:19 AM   Specimen: Nasopharyngeal Swab   Result Value Ref Range Status   SARS Coronavirus 2 NEGATIVE NEGATIVE Final    Comment: (NOTE) If result is NEGATIVE SARS-CoV-2 target nucleic acids are NOT DETECTED. The SARS-CoV-2 RNA is generally detectable in upper and lower  respiratory specimens during the acute phase of infection. The lowest  concentration of SARS-CoV-2 viral copies this assay can detect is 250  copies / mL. A negative result does not preclude SARS-CoV-2 infection  and should not be used as the sole basis for treatment or other  patient management decisions.  A negative result may occur with  improper specimen collection / handling, submission of specimen other  than nasopharyngeal swab, presence of viral mutation(s) within the  areas targeted by this assay, and inadequate  number of viral copies  (<250 copies / mL). A negative result must be combined with clinical  observations, patient history, and epidemiological information. If result is POSITIVE SARS-CoV-2 target nucleic acids are DETECTED. The SARS-CoV-2 RNA is generally detectable in upper and lower  respiratory specimens dur ing the acute phase of infection.  Positive  results are indicative of active infection with SARS-CoV-2.  Clinical  correlation with patient history and other diagnostic information is  necessary to determine patient infection status.  Positive results do  not rule out bacterial infection or co-infection with other viruses. If result is PRESUMPTIVE POSTIVE SARS-CoV-2 nucleic acids MAY BE PRESENT.   A presumptive positive result was obtained on the submitted specimen  and confirmed on repeat testing.  While 2019 novel coronavirus  (SARS-CoV-2) nucleic acids may be present in the submitted sample  additional confirmatory testing may be necessary for epidemiological  and / or clinical management purposes  to differentiate between  SARS-CoV-2 and other Sarbecovirus currently known to infect humans.  If clinically indicated additional  testing with an alternate test  methodology [phone]) is advised. The SARS-CoV-2 RNA is generally  detectable in upper and lower respiratory sp ecimens during the acute  phase of infection. The expected result is Negative. Fact Sheet for Patients:  StrictlyIdeas.no Fact Sheet for Healthcare Providers: BankingDealers.co.za This test is not yet approved or cleared by the [place] FDA and has been authorized for detection and/or diagnosis of SARS-CoV-2 by FDA under an Emergency Use Authorization (EUA).  This EUA will remain in effect (meaning this test can be used) for the duration of the COVID-19 declaration under Section 564(b)(1) of the Act, 21 U.S.C. section 360bbb-3(b)(1), unless the authorization is terminated or revoked sooner. Performed at [facility], [address] [place], [place] [address]   MRSA PCR Screening     Status: None   Collection Time: 09/18/19 11:43 AM   Specimen: Nasal Mucosa; Nasopharyngeal  Result Value Ref Range Status   MRSA by PCR NEGATIVE NEGATIVE Final    Comment:        The GeneXpert MRSA Assay (FDA approved for NASAL specimens only), is one component of a comprehensive MRSA colonization surveillance program. It is not intended to diagnose MRSA infection nor to guide or monitor treatment for MRSA infections. Performed at [facility], [address] [place], [place] [address]    Studies/Results: No results found.  Medications:  Prior to Admission:  Facility-Administered Medications Prior to Admission  Medication Dose Route Frequency Provider Last Rate Last Dose  . omalizumab Arvid Right) injection 300 mg  300 mg Subcutaneous Q14 Days [name], MD   300 mg at 02/09/19 1519   Medications Prior to Admission  Medication Sig Dispense Refill Last Dose  . acetaminophen (TYLENOL) 500 MG tablet Take 1,000 mg by mouth 3 (three) times daily.   09/18/2019 at 0415  . Aclidinium Bromide (TUDORZA  PRESSAIR) 400 MCG/ACT AEPB 1 puff BID 1 each 2 09/17/2019 at Unknown time  . albuterol (ACCUNEB) 0.63 MG/3ML nebulizer solution Take 3 mLs by nebulization every 4 (four) hours as needed for shortness of breath.     Marland Kitchen albuterol (PROVENTIL HFA;VENTOLIN HFA) 108 (90 Base) MCG/ACT inhaler Inhale 2 puffs every 4 (four) hours as needed into the lungs for shortness of breath (only if you can't catch your breath).     Marland Kitchen amLODipine (NORVASC) 5 MG tablet Take 1 tablet (5 mg total) by mouth daily. 90 tablet 3 09/17/2019 at Unknown time  . bisoprolol (  ZEBETA) 5 MG tablet TAKE 1 AND 1/2 TABLET BY MOUTH TWICE DAILY. 90 tablet 3 09/17/2019 at Unknown time  . canagliflozin (INVOKANA) 300 MG TABS tablet Take 300 mg by mouth daily before breakfast.   09/17/2019 at Unknown time  . Carboxymethylcellul-Glycerin (LUBRICATING EYE DROPS OP) Place 1 drop into the right eye daily as needed (dry eyes).   09/17/2019 at Unknown time  . cloNIDine (CATAPRES) 0.1 MG tablet Take 0.1 mg by mouth 2 (two) times daily.   09/17/2019 at Unknown time  . diphenhydramine-acetaminophen (TYLENOL PM) 25-500 MG TABS tablet Take 1 tablet by mouth at bedtime.     Marland Kitchen EPINEPHrine 0.3 mg/0.3 mL IJ SOAJ injection Inject 0.3 mg into the muscle as needed for anaphylaxis.    Past Month at Unknown time  . flecainide (TAMBOCOR) 150 MG tablet Take 0.5 tablets (75 mg total) by mouth 2 (two) times daily. 90 tablet 3 09/17/2019 at Unknown time  . glipiZIDE (GLUCOTROL XL) 5 MG 24 hr tablet Take 5 mg by mouth daily before breakfast.   09/17/2019 at Unknown time  . guaiFENesin (MUCINEX) 600 MG 12 hr tablet Take 600 mg by mouth 2 (two) times daily.    09/17/2019 at Unknown time  . hydrocortisone cream 1 % Apply 1 application topically daily as needed for itching.     . levothyroxine (SYNTHROID, LEVOTHROID) 137 MCG tablet Take 137 mcg by mouth daily before breakfast.    09/17/2019 at Unknown time  . losartan-hydrochlorothiazide (HYZAAR) 100-25 MG per tablet Take 1 tablet by  mouth daily.   09/17/2019 at Unknown time  . metFORMIN (GLUCOPHAGE-XR) 500 MG 24 hr tablet Take 1,000 mg by mouth at bedtime.    09/17/2019 at Unknown time  . montelukast (SINGULAIR) 10 MG tablet TAKE (1) TABLET BY MOUTH AT BEDTIME. 30 tablet 0 09/17/2019 at Unknown time  . Multiple Vitamin (MULTIVITAMIN) tablet Take 1 tablet by mouth daily.     09/17/2019 at Unknown time  . omeprazole (PRILOSEC OTC) 20 MG tablet Take 20 mg by mouth daily.    09/17/2019 at Unknown time  . rivaroxaban (XARELTO) 20 MG TABS tablet Take 20 mg daily with supper by mouth.   09/17/2019 at Unknown time  . voriconazole (VFEND) 200 MG tablet Take 1 tablet (200 mg total) by mouth 2 (two) times daily. 180 tablet 0 09/17/2019 at Unknown time  . WIXELA INHUB 500-50 MCG/DOSE AEPB INHALE 1 PUFF INTO THE LUNGS TWICE DAILY. RINSE MOUTH AFTER USE. 180 each 0 09/17/2019 at Unknown time  . XOLAIR 150 MG/ML prefilled syringe INJECT 300MG  SUBCUTANEOUSLY EVERY 4 WEEKS (GIVEN AT MD  OFFICE) 4 mL 5 Past Month at Unknown time  . Respiratory Therapy Supplies (FLUTTER) DEVI USE AS DIRECTED (Patient not taking: Reported on 09/18/2019) 1 each 0 Not Taking at Unknown time   Scheduled: . amoxicillin-clavulanate  1 tablet Oral Q12H  . bisoprolol  7.5 mg Oral BID  . Chlorhexidine Gluconate Cloth  6 each Topical Daily  . diltiazem  30 mg Oral Q6H  . flecainide  75 mg Oral BID  . guaiFENesin  1,200 mg Oral BID  . insulin aspart  0-20 Units Subcutaneous TID WC  . insulin aspart  0-5 Units Subcutaneous QHS  . insulin aspart  5 Units Subcutaneous TID WC  . ipratropium-albuterol  3 mL Nebulization Q6H  . levothyroxine  137 mcg Oral QAC breakfast  . mouth rinse  15 mL Mouth Rinse q12n4p  . mometasone-formoterol  2 puff Inhalation BID  . montelukast  10 mg Oral QHS  . multivitamin with minerals  1 tablet Oral Daily  . pantoprazole  40 mg Oral BID AC  . potassium chloride  20 mEq Oral Once  . rivaroxaban  20 mg Oral Q supper  . voriconazole  200 mg Oral  BID   Continuous:  [id number], diphenhydrAMINE, haloperidol lactate, LORazepam, ondansetron **OR** ondansetron (ZOFRAN) IV, sodium chloride, traZODone  Assesment: He was admitted with acute hypoxic respiratory failure.  This is on the basis of lobar pneumonia and severe persistent asthma.  He is coughing up more sputum now.  He remains on antibiotics.  He is still hypoxic.  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  He has sleep apnea not able to tolerate CPAP  He had severe sepsis on admission and sepsis pathophysiology has resolved  He has developed atrial fib with rapid ventricular response.  He has had atrial fib in the past but has been in sinus rhythm for the last several months.  His heart rate is still high  He has diabetes and that seems to be doing okay  He has hypertension and fairly well controlled he has had the addition now of diltiazem for heart rate control Principal Problem:   Acute on chronic respiratory failure ([place]) Active Problems:   Essential hypertension, benign   Premature ventricular contractions   PPM-St.Jude   PVC's (premature ventricular contractions)   Sinoatrial node dysfunction (HCC)   Persistent atrial fibrillation   Hyperthyroidism   Hyperlipidemia   RBBB (right bundle branch block)   Carotid artery stenosis   Type 2 diabetes mellitus (HCC)   OSA (obstructive sleep apnea)   Severe persistent asthma   CAP (community acquired pneumonia)   Chronic bronchitis (HCC)   Severe sepsis (HCC)   Elevated lactic acid level   GERD (gastroesophageal reflux disease)   Lobar pneumonia (HCC)   Acute respiratory failure with hypoxia (HCC)   Paroxysmal A-fib (HCC)   Atrial fibrillation with  RVR ([place])    Plan: Continue treatments.  Continue antibiotics.  Per Dr. Carles Collet possible discharge tomorrow if his heart rate is controlled    LOS: 6 days   Clarence Dawson 09/24/2019, 8:20 AM

## 2019-09-24 NOTE — Progress Notes (Addendum)
PROGRESS NOTE  Clarence Dawson [id number] DOB: [birth year] DOA: 09/18/2019 PCP: [name], MD  Brief History: 73 year old male with a history of COPD, allergic bronchopulmonary aspergillosis, severe persistent asthma, paroxysmal atrial fibrillation, sinus node dysfunction status post PPM, hypothyroidism, hyperlipidemia presented with fevers and confusion. In emergency department, the patient was noted to have temperature of 101.8 F with lactic acid 4.2. Patient hadoxygen saturation 85% on room air. Chest x-ray showed left basilar opacity/infiltrate. Patient was started on intravenous Zosyn and vancomycin initially. Pulmonary medicine was consulted to assist with management.  Patient developed atrial fibrillation with soft BPs.  As pt is normally in sinus rhythm, cardiology was consulted to assist with management.  Assessment/Plan: Acute respiratory failure with hypoxia -Secondary to pneumoniain setting of asthma and ABPA -presently stable on 3L -ambulatory pulse ox--desaturated <88% -will need home oxygen  Severe sepsis -Present on admission -Presented with fever up to 101.8 F with lactic acid 4.2 -Continue IV Zosyn>>>amox/clav -09/18/2019 blood cultures negative -Urinalysis negative for pyuria -Personally reviewed chest x-ray--R>L basilar opacity -sepsis physiology resolved  Lobar pneumonia -Continue Zosyn>>>amox/clav -Check procalcitonin<0.10  Paroxysmal atrial fibrillationwith RVR -continue flecainide -remains in afib despite 2 bolus/cardioversion doses of flecainide -continue bisoprolol -HR remains elevated 100-105 for past 48 hours -add diltiazem -consult cardiology-appreciate recommendation -Continue rivaroxaban -try keep K>/=4, give additional KCl 20 -am BMP -personally reviewed telemetry--remained afib with HR ~100 most of night  Essential Hypertension -d/c clonidine for now to allow for margin for BP -holding losartan/hctz,  clonidine -added diltiazem for HR control -continue bisoprolol  Diabetes mellitus type 2 -Continue NovoLog sliding scale -Continue NovoLog 5 units with meals -holding invokana, glipizide, metformin -09/18/19 A1C--7.2  Hypothyroidism -Continue Synthroid  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  Patient denies fevers, chills, headache, chest pain, nausea, vomiting, diarrhea, abdominal pain, dysuria, hematuria, hematochezia, and melena.  Has some chest congestion today.   Objective: Vitals:   09/24/19 0600 09/24/19 0751 09/24/19 0755 09/24/19 0757  BP:      Pulse: 100  97   Resp: 12  11   Temp:   98.3 F (36.8 C)   TempSrc:   Oral   SpO2: 94% 94% 95% 96%  Weight:      Height:        Intake/Output Summary (Last 24 hours) at 09/24/2019 9233 Last data filed at 09/24/2019 0330 Gross per 24 hour  Intake -  Output 800 ml  Net -800 ml   Weight change:  Exam:   General:  Pt is alert, follows commands appropriately, not in acute distress  HEENT: No icterus, No thrush, No neck mass, [place]/AT  Cardiovascular: RRR, S1/S2, no rubs, no gallops  Respiratory: bibasilar crackles, no wheeze  Abdomen: Soft/+BS, non tender, non distended, no guarding  Extremities: 1+LE edema, No lymphangitis, No petechiae, No rashes, no synovitis   Data Reviewed: I have personally reviewed following labs and imaging studies Basic Metabolic Panel: Recent Labs  Lab 09/19/19 0455 09/20/19 0411 09/21/19 0633 09/22/19 0424 09/23/19 0440 09/24/19 0449  NA 138 140 140 140 140 139   K 4.2 4.6 4.0 3.8 3.9 3.8  CL  108 109 109 110 106 106  CO2 20* 25 25 26 25 28   GLUCOSE 150* 161* 160* 146* 138* 136*  BUN 27* 34* 20 22 20 19   CREATININE 0.84 0.96 0.67 0.83 0.72 0.63  CALCIUM 8.0* 8.2* 8.1* 8.3* 8.5* 8.6*  MG 2.2 2.4 2.3  --  2.2  --    Liver Function Tests: Recent Labs  Lab 09/19/19 0455 09/20/19 0411 09/21/19 0633 09/22/19 0424 09/23/19 0440  AST 19 18 20 20 16   ALT 23 21 24 26 24   ALKPHOS 72 58 53 52 59  BILITOT 1.0 0.6 0.7 0.8 1.1  PROT 6.1* 6.1* 6.0* 6.3* 6.6  ALBUMIN 2.9* 2.8* 2.9* 3.0* 3.2*   No results for input(s): LIPASE, AMYLASE in the last 168 hours. No results for input(s): AMMONIA in the last 168 hours. Coagulation Profile: Recent Labs  Lab 09/18/19 0747  INR 1.5*   CBC: Recent Labs  Lab 09/20/19 0411 09/21/19 0633 09/22/19 0424 09/23/19 0440 09/24/19 0449  WBC 7.1 5.4 5.5 6.4 6.4  NEUTROABS 5.2 3.4 3.5 4.1 4.2  HGB 15.2 16.1 15.7 16.8 16.9  HCT 47.7 49.7 49.6 52.2* 51.3  MCV 96.4 96.5 96.7 95.1 94.3  PLT 183 162 172 192 194   Cardiac Enzymes: No results for input(s): CKTOTAL, CKMB, CKMBINDEX, TROPONINI in the last 168 hours. BNP: Invalid input(s): POCBNP CBG: Recent Labs  Lab 09/23/19 1151 09/23/19 1641 09/23/19 2125 09/24/19 0313 09/24/19 0754  GLUCAP 190* 103* 156* 140* 153*   HbA1C: No results for input(s): HGBA1C in the last 72 hours. Urine analysis:    Component Value Date/Time   COLORURINE YELLOW 09/18/2019 [place] 09/18/2019 0747   LABSPEC 1.021 09/18/2019 0747   PHURINE 6.0 09/18/2019 0747   GLUCOSEU >=500 (A) 09/18/2019 0747   HGBUR NEGATIVE 09/18/2019 0747   BILIRUBINUR NEGATIVE 09/18/2019 0747   KETONESUR 5 (A) 09/18/2019 0747   PROTEINUR 30 (A) 09/18/2019 0747   UROBILINOGEN 1.0 11/15/2012 1051   NITRITE NEGATIVE 09/18/2019 0747   LEUKOCYTESUR NEGATIVE 09/18/2019 0747   Sepsis Labs: @LABRCNTIP (procalcitonin:4,lacticidven:4) ) Recent Results (from the past 240 hour(s))   Culture, blood (Routine x 2)     Status: None   Collection Time: 09/18/19  7:47 AM   Specimen: BLOOD RIGHT HAND  Result Value Ref Range Status   Specimen Description BLOOD RIGHT HAND  Final   Special Requests   Final    BOTTLES DRAWN AEROBIC AND ANAEROBIC Blood Culture results may not be optimal due to an inadequate volume of blood received in culture bottles   Culture   Final    NO GROWTH 5 DAYS Performed at [facility], [address], [place], [place] [address]    Report Status 09/23/2019 FINAL  Final  Culture, blood (Routine x 2)     Status: None   Collection Time: 09/18/19  7:52 AM   Specimen: Right Antecubital; Blood  Result Value Ref Range Status   Specimen Description RIGHT ANTECUBITAL  Final   Special Requests   Final    Blood Culture results may not be optimal due to an inadequate volume of blood received in culture bottles BOTTLES DRAWN AEROBIC AND ANAEROBIC   Culture   Final    NO GROWTH 5 DAYS Performed at [facility], [address], [place], [place] [address]    Report Status 09/23/2019 FINAL  Final  Urine culture     Status: None   Collection Time: 09/18/19  7:59 AM   Specimen: In/Out Cath  Urine  Result Value Ref Range Status   Specimen Description   Final    IN/OUT CATH URINE Performed at [facility], [address] [place], [place] [address]    Special Requests   Final    NONE Performed at [facility], [address], [place], [place] [address]    Culture   Final    NO GROWTH Performed at [facility], [place] [address] [place], [place] [address]    Report Status 09/19/2019 FINAL  Final  SARS Coronavirus 2 [facility] order, Performed in [facility] hospital lab) Nasopharyngeal Nasopharyngeal Swab     Status: None   Collection Time: 09/18/19  8:19 AM   Specimen: Nasopharyngeal Swab  Result Value Ref Range Status   SARS Coronavirus 2 NEGATIVE NEGATIVE Final    Comment: (NOTE) If result is NEGATIVE SARS-CoV-2 target nucleic acids are NOT  DETECTED. The SARS-CoV-2 RNA is generally detectable in upper and lower  respiratory specimens during the acute phase of infection. The lowest  concentration of SARS-CoV-2 viral copies this assay can detect is 250  copies / mL. A negative result does not preclude SARS-CoV-2 infection  and should not be used as the sole basis for treatment or other  patient management decisions.  A negative result may occur with  improper specimen collection / handling, submission of specimen other  than nasopharyngeal swab, presence of viral mutation(s) within the  areas targeted by this assay, and inadequate number of viral copies  (<250 copies / mL). A negative result must be combined with clinical  observations, patient history, and epidemiological information. If result is POSITIVE SARS-CoV-2 target nucleic acids are DETECTED. The SARS-CoV-2 RNA is generally detectable in upper and lower  respiratory specimens dur ing the acute phase of infection.  Positive  results are indicative of active infection with SARS-CoV-2.  Clinical  correlation with patient history and other diagnostic information is  necessary to determine patient infection status.  Positive results do  not rule out bacterial infection or co-infection with other viruses. If result is PRESUMPTIVE POSTIVE SARS-CoV-2 nucleic acids MAY BE PRESENT.   A presumptive positive result was obtained on the submitted specimen  and confirmed on repeat testing.  While 2019 novel coronavirus  (SARS-CoV-2) nucleic acids may be present in the submitted sample  additional confirmatory testing may be necessary for epidemiological  and / or clinical management purposes  to differentiate between  SARS-CoV-2 and other Sarbecovirus currently known to infect humans.  If clinically indicated additional testing with an alternate test  methodology [phone]) is advised. The SARS-CoV-2 RNA is generally  detectable in upper and lower respiratory sp ecimens during  the acute  phase of infection. The expected result is Negative. Fact Sheet for Patients:  StrictlyIdeas.no Fact Sheet for Healthcare Providers: BankingDealers.co.za This test is not yet approved or cleared by the [place] FDA and has been authorized for detection and/or diagnosis of SARS-CoV-2 by FDA under an Emergency Use Authorization (EUA).  This EUA will remain in effect (meaning this test can be used) for the duration of the COVID-19 declaration under Section 564(b)(1) of the Act, 21 U.S.C. section 360bbb-3(b)(1), unless the authorization is terminated or revoked sooner. Performed at [facility], [address] [place], [place] [address]   MRSA PCR Screening     Status: None   Collection Time: 09/18/19 11:43 AM   Specimen: Nasal Mucosa; Nasopharyngeal  Result Value Ref Range Status   MRSA by PCR NEGATIVE NEGATIVE Final  Comment:        The GeneXpert MRSA Assay (FDA approved for NASAL specimens only), is one component of a comprehensive MRSA colonization surveillance program. It is not intended to diagnose MRSA infection nor to guide or monitor treatment for MRSA infections. Performed at [facility], [address] [place], [place] [address]      Scheduled Meds: . amoxicillin-clavulanate  1 tablet Oral Q12H  . bisoprolol  7.5 mg Oral BID  . Chlorhexidine Gluconate Cloth  6 each Topical Daily  . diltiazem  30 mg Oral Q6H  . flecainide  75 mg Oral BID  . guaiFENesin  1,200 mg Oral BID  . insulin aspart  0-20 Units Subcutaneous TID WC  . insulin aspart  0-5 Units Subcutaneous QHS  . insulin aspart  5 Units Subcutaneous TID WC  . ipratropium-albuterol  3 mL Nebulization Q6H  . levothyroxine  137 mcg Oral QAC breakfast  . mouth rinse  15 mL Mouth Rinse q12n4p  . mometasone-formoterol  2 puff Inhalation BID  . montelukast  10 mg Oral QHS  . multivitamin with minerals  1 tablet Oral Daily  . pantoprazole  40 mg Oral  BID AC  . rivaroxaban  20 mg Oral Q supper  . voriconazole  200 mg Oral BID   Continuous Infusions:  Procedures/Studies: Ct Head Wo Contrast  Result Date: 09/18/2019 CLINICAL DATA:  Confusion EXAM: CT HEAD WITHOUT CONTRAST TECHNIQUE: Contiguous axial images were obtained from the base of the skull through the vertex without intravenous contrast. COMPARISON:  None. FINDINGS: Brain: No evidence of acute infarction, hemorrhage, hydrocephalus, extra-axial collection or mass lesion/mass effect. Vascular: No hyperdense vessel or unexpected calcification. Skull: Normal. Negative for fracture or focal lesion. Sinuses/Orbits: No acute finding. Other: None. IMPRESSION: No acute intracranial pathology. Electronically Signed   By: Eddie Candle M.D.   On: 09/18/2019 14:53   Portable Chest 1 View  Result Date: 09/19/2019 CLINICAL DATA:  Fever, cough. EXAM: PORTABLE CHEST 1 VIEW COMPARISON:  Radiograph of September 18, 2019. FINDINGS: Stable cardiomediastinal silhouette. Left-sided pacemaker is unchanged in position. No pneumothorax or pleural effusion is noted. Mildly decreased right lung opacity is noted suggesting improving pneumonia. Mild left basilar atelectasis or infiltrate is noted. Bony thorax is unremarkable. IMPRESSION: Mildly decreased right lung opacity is noted suggesting improving pneumonia. Mild left basilar subsegmental atelectasis or infiltrate is noted. Electronically Signed   By: Marijo Conception M.D.   On: 09/19/2019 07:45   Dg Chest Portable 1 View  Result Date: 09/18/2019 CLINICAL DATA:  Code sepsis.  Restless.  Fever. EXAM: PORTABLE CHEST 1 VIEW COMPARISON:  07/07/2019 FINDINGS: Study mildly degraded by motion. Cardiac silhouette normal in size. No mediastinal or hilar masses. Mild hazy opacity is noted in the right mid to lower lung, which is accentuated by respiratory motion. Mild opacity at the left lung base consistent with atelectasis or scarring. Lungs otherwise clear. No convincing  effusion. No pneumothorax. Left anterior chest wall sequential pacemaker is stable. IMPRESSION: 1. Hazy opacity in the right mid to lower lung. This is consistent with pneumonia proper clinical setting. No other evidence of acute cardiopulmonary disease. Electronically Signed   By: Lajean Manes M.D.   On: 09/18/2019 08:55    Orson Eva, DO  [facility] Pager [phone]  If 7PM-7AM, please contact night-coverage [email or web address] Password [email or web address] 09/24/2019, 8:11 AM   LOS: 6 days

## 2019-09-25 LAB — CBC WITH DIFFERENTIAL/PLATELET
Abs Immature Granulocytes: 0.04 10*3/uL (ref 0.00–0.07)
Basophils Absolute: 0 10*3/uL (ref 0.0–0.1)
Basophils Relative: 1 %
Eosinophils Absolute: 0.1 10*3/uL (ref 0.0–0.5)
Eosinophils Relative: 2 %
HCT: 49.1 % (ref 39.0–52.0)
Hemoglobin: 16 g/dL (ref 13.0–17.0)
Immature Granulocytes: 1 %
Lymphocytes Relative: 22 %
Lymphs Abs: 1.4 10*3/uL (ref 0.7–4.0)
MCH: 31 pg (ref 26.0–34.0)
MCHC: 32.6 g/dL (ref 30.0–36.0)
MCV: 95.2 fL (ref 80.0–100.0)
Monocytes Absolute: 0.6 10*3/uL (ref 0.1–1.0)
Monocytes Relative: 10 %
Neutro Abs: 4.2 10*3/uL (ref 1.7–7.7)
Neutrophils Relative %: 64 %
Platelets: 193 10*3/uL (ref 150–400)
RBC: 5.16 MIL/uL (ref 4.22–5.81)
RDW: 12.8 % (ref 11.5–15.5)
WBC: 6.4 10*3/uL (ref 4.0–10.5)
nRBC: 0 % (ref 0.0–0.2)

## 2019-09-25 LAB — BASIC METABOLIC PANEL
Anion gap: 5 (ref 5–15)
BUN: 21 mg/dL (ref 8–23)
CO2: 29 mmol/L (ref 22–32)
Calcium: 8.6 mg/dL — ABNORMAL LOW (ref 8.9–10.3)
Chloride: 106 mmol/L (ref 98–111)
Creatinine, Ser: 0.81 mg/dL (ref 0.61–1.24)
GFR calc Af Amer: >60 mL/min (ref >60)
GFR calc non Af Amer: >60 mL/min (ref >60)
Glucose, Bld: 168 mg/dL — ABNORMAL HIGH (ref 70–99)
Potassium: 4.4 mmol/L (ref 3.5–5.1)
Sodium: 140 mmol/L (ref 135–145)

## 2019-09-25 LAB — GLUCOSE, CAPILLARY
Glucose-Capillary: 166 mg/dL — ABNORMAL HIGH (ref 70–99)
Glucose-Capillary: 211 mg/dL — ABNORMAL HIGH (ref 70–99)

## 2019-09-25 LAB — MAGNESIUM: Magnesium: 2.2 mg/dL (ref 1.7–2.4)

## 2019-09-25 MED ORDER — DILTIAZEM HCL ER COATED BEADS 120 MG PO CP24: 120.0000 mg | ORAL_CAPSULE | Freq: Every day | ORAL | 1 refills | Status: DC

## 2019-09-25 MED ORDER — DILTIAZEM HCL ER COATED BEADS 120 MG PO CP24
120.0000 mg | ORAL_CAPSULE | Freq: Every day | ORAL | Status: DC
  Administered 2019-09-25: 15:00:00 120 mg via ORAL
  Filled 2019-09-25: qty 1

## 2019-09-25 NOTE — Progress Notes (Signed)
Pt wheeled out via w/c with oxygen.

## 2019-09-25 NOTE — Discharge Summary (Signed)
Physician Discharge Summary  Clarence Dawson [id number] DOB: Jan 06, 1946 DOA: 09/18/2019  PCP: [name], MD  Admit date: 09/18/2019 Discharge date: 09/25/2019  Admitted From: HOME Disposition:  Home   Recommendations for Outpatient Follow-up:  1. Follow up with PCP in 1-2 weeks 2. Please obtain BMP/CBC in one week 3. Follow afib clinic 09/30/19 at [place]: YES Equipment/Devices: 3 L [place]  Discharge Condition: Stable CODE STATUS: FULL Diet recommendation: Heart Healthy    Brief/Interim Summary: 73 year old male with a history of COPD, allergic bronchopulmonary aspergillosis, severe persistent asthma, paroxysmal atrial fibrillation, sinus node dysfunction status post PPM, hypothyroidism, hyperlipidemia presented with fevers and confusion. In emergency department, the patient was noted to have temperature of 101.8 F with lactic acid 4.2. Patient hadoxygen saturation 85% on room air. Chest x-ray showed left basilar opacity/infiltrate. Patient was started on intravenous Zosyn and vancomycin initially. Pulmonary medicine was consulted to assist with management.Patient developed atrial fibrillation with soft BPs. As pt is normally in sinus rhythm, cardiology was consulted to assist with management.  Discharge Diagnoses:   Acute respiratory failure with hypoxia -Secondary to pneumoniain setting of asthma and ABPA -presently stable on3L -ambulatory pulse ox--desaturated <88% -will need home oxygen  Severe sepsis -Present on admission -Presented with fever up to 101.8 F with lactic acid 4.2 -Continue IV Zosyn>>>amox/clav -09/18/2019 blood cultures negative -Urinalysis negative for pyuria -Personally reviewed chest x-ray--R>L basilar opacity -sepsis physiology resolved  Lobar pneumonia -Continue Zosyn>>>amox/clav -Check procalcitonin<0.10  Paroxysmal atrial fibrillationwith RVR -continue flecainide -remains in afib despite 2 bolus/cardioversion  doses of flecainide -continue bisoprolol -HR remains elevated 100-105 for past 48 hours -added diltiazem CD 120 mg daily due to suboptimal HR control -remained stable without concerning dysrhythmias on diltiazem -consult cardiology-appreciate recommendation -Continue rivaroxaban -try keep K>/=4 -personally reviewed telemetry--remained afib with HR ~90 Essential Hypertension -d/c clonidine for now to allow for margin for BP -holding losartan/hctz, clonidine--will not restart -BP remains well controlled -added diltiazem for HR control -continue bisoprolol  Diabetes mellitus type 2 -Continue NovoLog sliding scale -Continue NovoLog 5 units with meals -holding invokana, glipizide, metformin--restart after d/c -09/18/19 A1C--7.2  Hypothyroidism -Continue Synthroid  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 (01/15/17: patient states this reaction was "about 20 years ago" with possibly an IVP.  He now says high doses of prednisone "throw me into AFib."  He has tolerated CT arthrograms with Benadrly 50mg  PO one hour before injection.  Brita Romp, RN)   Shellfish Allergy Anaphylaxis, Shortness Of Breath   To shellfish, crabs.  Makes him feel like "things are crawling all over" me.  Denies airway issues with these foods.  Brita Romp, RN 01/15/17)   Goat-derived Products Hives   GOAT CHEESE   Prednisone Palpitations   PRECIPITATES A-FIB   Metformin And Related Diarrhea   High doses at once   Voltaren [diclofenac Sodium] Other (See Comments)   Feels like things are crawling on him      Medication List    STOP taking these medications   amLODipine 5 MG tablet Commonly known as: NORVASC   cloNIDine 0.1 MG  tablet Commonly known as: CATAPRES   Flutter Devi   losartan-hydrochlorothiazide 100-25 MG tablet Commonly known as: Medtronic  TAKE these medications   acetaminophen 500 MG tablet Commonly known as: TYLENOL Take 1,000 mg by mouth 3 (three) times daily.   albuterol 108 (90 Base) MCG/ACT inhaler Commonly known as: VENTOLIN HFA Inhale 2 puffs every 4 (four) hours as needed into the lungs for shortness of breath (only if you can't catch your breath).   albuterol 0.63 MG/3ML nebulizer solution Commonly known as: ACCUNEB Take 3 mLs by nebulization every 4 (four) hours as needed for shortness of breath.   bisoprolol 5 MG tablet Commonly known as: ZEBETA TAKE 1 AND 1/2 TABLET BY MOUTH TWICE DAILY.   diltiazem 120 MG 24 hr capsule Commonly known as: CARDIZEM CD Take 1 capsule (120 mg total) by mouth daily.   diphenhydramine-acetaminophen 25-500 MG Tabs tablet Commonly known as: TYLENOL PM Take 1 tablet by mouth at bedtime.   EPINEPHrine 0.3 mg/0.3 mL Soaj injection Commonly known as: EPI-PEN Inject 0.3 mg into the muscle as needed for anaphylaxis.   flecainide 150 MG tablet Commonly known as: TAMBOCOR Take 0.5 tablets (75 mg total) by mouth 2 (two) times daily.   glipiZIDE 5 MG 24 hr tablet Commonly known as: GLUCOTROL XL Take 5 mg by mouth daily before breakfast.   guaiFENesin 600 MG 12 hr tablet Commonly known as: MUCINEX Take 600 mg by mouth 2 (two) times daily.   hydrocortisone cream 1 % Apply 1 application topically daily as needed for itching.   Invokana 300 MG Tabs tablet Generic drug: canagliflozin Take 300 mg by mouth daily before breakfast.   levothyroxine 137 MCG tablet Commonly known as: SYNTHROID Take 137 mcg by mouth daily before breakfast.   LUBRICATING EYE DROPS OP Place 1 drop into the right eye daily as needed (dry eyes).   metFORMIN 500 MG 24 hr tablet Commonly known as: GLUCOPHAGE-XR Take 1,000 mg by mouth at bedtime.   montelukast 10 MG  tablet Commonly known as: SINGULAIR TAKE (1) TABLET BY MOUTH AT BEDTIME.   multivitamin tablet Take 1 tablet by mouth daily.   omeprazole 20 MG tablet Commonly known as: PRILOSEC OTC Take 20 mg by mouth daily.   rivaroxaban 20 MG Tabs tablet Commonly known as: XARELTO Take 20 mg daily with supper by mouth.   Tudorza Pressair 400 MCG/ACT Aepb Generic drug: Aclidinium Bromide 1 puff BID   voriconazole 200 MG tablet Commonly known as: VFEND Take 1 tablet (200 mg total) by mouth 2 (two) times daily.   Wixela Inhub 500-50 MCG/DOSE Aepb Generic drug: Fluticasone-Salmeterol INHALE 1 PUFF INTO THE LUNGS TWICE DAILY. RINSE MOUTH AFTER USE.   Xolair 150 MG/ML prefilled syringe Generic drug: omalizumab INJECT 300MG  SUBCUTANEOUSLY EVERY 4 WEEKS (GIVEN AT MD  OFFICE)            Durable Medical Equipment  (From admission, onward)         Start     Ordered   09/23/19 1634  For home use only DME oxygen  Once    Comments: Please provide POC  Question Answer Comment  Length of Need 6 Months   Mode or (Route) Nasal cannula   Liters per Minute 3   Frequency Continuous (stationary and portable oxygen unit needed)   Oxygen conserving device Yes   Oxygen delivery system Other see comments      09/23/19 1635         Follow-up Information    Sherran Needs, NP Follow up on 09/30/2019.   Specialties: Nurse Practitioner, Cardiology Why: Follow-Up Appointment with the [facility] on 09/30/2019 at 10:00 AM. Parking Code for October is 7007. Contact information: [place] [address] [phone]        [facility] [employer] Follow up.   Why: Home oxygen Contact information: [place] [address] [phone]          Allergies  Allergen Reactions  . Food Anaphylaxis and Shortness Of Breath    TREE NUTS  . Iodinated Diagnostic Agents Hives and Shortness Of Breath    Patient states hives to throat closing. (01/15/17:  patient states this reaction was "about 20 years ago" with possibly an IVP.  He now says high doses of prednisone "throw me into AFib."  He has tolerated CT arthrograms with Benadrly 50mg  PO one hour before injection.  Brita Romp, RN)  . Shellfish Allergy Anaphylaxis and Shortness Of Breath    To shellfish, crabs.  Makes him feel like "things are crawling all over" me.  Denies airway issues with these foods.  Brita Romp, RN 01/15/17)  . Goat-Derived Child psychotherapist   . Prednisone Palpitations    PRECIPITATES A-FIB  . Metformin And Related Diarrhea    High doses at once  . Voltaren [Diclofenac Sodium] Other (See Comments)    Feels like things are crawling on him    Consultations:  Pulmonary  cardiology   Procedures/Studies: Ct Head Wo Contrast  Result Date: 09/18/2019 CLINICAL DATA:  Confusion EXAM: CT HEAD WITHOUT CONTRAST TECHNIQUE: Contiguous axial images were obtained from the base of the skull through the vertex without intravenous contrast. COMPARISON:  None. FINDINGS: Brain: No evidence of acute infarction, hemorrhage, hydrocephalus, extra-axial collection or mass lesion/mass effect. Vascular: No hyperdense vessel or unexpected calcification. Skull: Normal. Negative for fracture or focal lesion. Sinuses/Orbits: No acute finding. Other: None. IMPRESSION: No acute intracranial pathology. Electronically Signed   By: Eddie Candle M.D.   On: 09/18/2019 14:53   Portable Chest 1 View  Result Date: 09/19/2019 CLINICAL DATA:  Fever, cough. EXAM: PORTABLE CHEST 1 VIEW COMPARISON:  Radiograph of September 18, 2019. FINDINGS: Stable cardiomediastinal silhouette. Left-sided pacemaker is unchanged in position. No pneumothorax or pleural effusion is noted. Mildly decreased right lung opacity is noted suggesting improving pneumonia. Mild left basilar atelectasis or infiltrate is noted. Bony thorax is unremarkable. IMPRESSION: Mildly decreased right lung opacity is noted suggesting  improving pneumonia. Mild left basilar subsegmental atelectasis or infiltrate is noted. Electronically Signed   By: Marijo Conception M.D.   On: 09/19/2019 07:45   Dg Chest Portable 1 View  Result Date: 09/18/2019 CLINICAL DATA:  Code sepsis.  Restless.  Fever. EXAM: PORTABLE CHEST 1 VIEW COMPARISON:  07/07/2019 FINDINGS: Study mildly degraded by motion. Cardiac silhouette normal in size. No mediastinal or hilar masses. Mild hazy opacity is noted in the right mid to lower lung, which is accentuated by respiratory motion. Mild opacity at the left lung base consistent with atelectasis or scarring. Lungs otherwise clear. No convincing effusion. No pneumothorax. Left anterior chest wall sequential pacemaker is stable. IMPRESSION: 1. Hazy opacity in the right mid to lower lung. This is consistent with pneumonia proper clinical setting. No other evidence of acute cardiopulmonary disease. Electronically Signed   By: Lajean Manes M.D.   On: 09/18/2019 08:55         Discharge Exam: Vitals:   09/25/19 1113 09/25/19 1200  BP:  127/73  Pulse: 81 87  Resp: 20 16  Temp: 98.3 F (  36.8 C)   SpO2: 100% 97%   Vitals:   09/25/19 1000 09/25/19 1100 09/25/19 1113 09/25/19 1200  BP: 113/78 104/66  127/73  Pulse: 80 71 81 87  Resp: 14 19 20 16   Temp:   98.3 F (36.8 C)   TempSrc:   Oral   SpO2: 95% 94% 100% 97%  Weight:      Height:        General: Pt is alert, awake, not in acute distress Cardiovascular: RRR, S1/S2 +, no rubs, no gallops Respiratory: bibasilar rales, no wheeze Abdominal: Soft, NT, ND, bowel sounds + Extremities: no edema, no cyanosis   The results of significant diagnostics from this hospitalization (including imaging, microbiology, ancillary and laboratory) are listed below for reference.    Significant Diagnostic Studies: Ct Head Wo Contrast  Result Date: 09/18/2019 CLINICAL DATA:  Confusion EXAM: CT HEAD WITHOUT CONTRAST TECHNIQUE: Contiguous axial images were obtained  from the base of the skull through the vertex without intravenous contrast. COMPARISON:  None. FINDINGS: Brain: No evidence of acute infarction, hemorrhage, hydrocephalus, extra-axial collection or mass lesion/mass effect. Vascular: No hyperdense vessel or unexpected calcification. Skull: Normal. Negative for fracture or focal lesion. Sinuses/Orbits: No acute finding. Other: None. IMPRESSION: No acute intracranial pathology. Electronically Signed   By: Eddie Candle M.D.   On: 09/18/2019 14:53   Portable Chest 1 View  Result Date: 09/19/2019 CLINICAL DATA:  Fever, cough. EXAM: PORTABLE CHEST 1 VIEW COMPARISON:  Radiograph of September 18, 2019. FINDINGS: Stable cardiomediastinal silhouette. Left-sided pacemaker is unchanged in position. No pneumothorax or pleural effusion is noted. Mildly decreased right lung opacity is noted suggesting improving pneumonia. Mild left basilar atelectasis or infiltrate is noted. Bony thorax is unremarkable. IMPRESSION: Mildly decreased right lung opacity is noted suggesting improving pneumonia. Mild left basilar subsegmental atelectasis or infiltrate is noted. Electronically Signed   By: Marijo Conception M.D.   On: 09/19/2019 07:45   Dg Chest Portable 1 View  Result Date: 09/18/2019 CLINICAL DATA:  Code sepsis.  Restless.  Fever. EXAM: PORTABLE CHEST 1 VIEW COMPARISON:  07/07/2019 FINDINGS: Study mildly degraded by motion. Cardiac silhouette normal in size. No mediastinal or hilar masses. Mild hazy opacity is noted in the right mid to lower lung, which is accentuated by respiratory motion. Mild opacity at the left lung base consistent with atelectasis or scarring. Lungs otherwise clear. No convincing effusion. No pneumothorax. Left anterior chest wall sequential pacemaker is stable. IMPRESSION: 1. Hazy opacity in the right mid to lower lung. This is consistent with pneumonia proper clinical setting. No other evidence of acute cardiopulmonary disease. Electronically Signed   By:  Lajean Manes M.D.   On: 09/18/2019 08:55     Microbiology: Recent Results (from the past 240 hour(s))  Culture, blood (Routine x 2)     Status: None   Collection Time: 09/18/19  7:47 AM   Specimen: BLOOD RIGHT HAND  Result Value Ref Range Status   Specimen Description BLOOD RIGHT HAND  Final   Special Requests   Final    BOTTLES DRAWN AEROBIC AND ANAEROBIC Blood Culture results may not be optimal due to an inadequate volume of blood received in culture bottles   Culture   Final    NO GROWTH 5 DAYS Performed at [facility], [address], [place], [place] [address]    Report Status 09/23/2019 FINAL  Final  Culture, blood (Routine x 2)     Status: None   Collection Time: 09/18/19  7:52 AM  Specimen: Right Antecubital; Blood  Result Value Ref Range Status   Specimen Description RIGHT ANTECUBITAL  Final   Special Requests   Final    Blood Culture results may not be optimal due to an inadequate volume of blood received in culture bottles BOTTLES DRAWN AEROBIC AND ANAEROBIC   Culture   Final    NO GROWTH 5 DAYS Performed at [facility], [address] [place], [place] [address]    Report Status 09/23/2019 FINAL  Final  Urine culture     Status: None   Collection Time: 09/18/19  7:59 AM   Specimen: In/Out Cath Urine  Result Value Ref Range Status   Specimen Description   Final    IN/OUT CATH URINE Performed at [facility], [address], [place], [place] [address]    Special Requests   Final    NONE Performed at [facility], [address] [place], [place] [address]    Culture   Final    NO GROWTH Performed at [facility], [place] [address], [place],  [address]    Report Status 09/19/2019 FINAL  Final  SARS Coronavirus 2 [facility] order, Performed in [facility] hospital lab) Nasopharyngeal Nasopharyngeal Swab     Status: None   Collection Time: 09/18/19  8:19 AM   Specimen: Nasopharyngeal Swab  Result Value Ref Range Status   SARS Coronavirus 2 NEGATIVE  NEGATIVE Final    Comment: (NOTE) If result is NEGATIVE SARS-CoV-2 target nucleic acids are NOT DETECTED. The SARS-CoV-2 RNA is generally detectable in upper and lower  respiratory specimens during the acute phase of infection. The lowest  concentration of SARS-CoV-2 viral copies this assay can detect is 250  copies / mL. A negative result does not preclude SARS-CoV-2 infection  and should not be used as the sole basis for treatment or other  patient management decisions.  A negative result may occur with  improper specimen collection / handling, submission of specimen other  than nasopharyngeal swab, presence of viral mutation(s) within the  areas targeted by this assay, and inadequate number of viral copies  (<250 copies / mL). A negative result must be combined with clinical  observations, patient history, and epidemiological information. If result is POSITIVE SARS-CoV-2 target nucleic acids are DETECTED. The SARS-CoV-2 RNA is generally detectable in upper and lower  respiratory specimens dur ing the acute phase of infection.  Positive  results are indicative of active infection with SARS-CoV-2.  Clinical  correlation with patient history and other diagnostic information is  necessary to determine patient infection status.  Positive results do  not rule out bacterial infection or co-infection with other viruses. If result is PRESUMPTIVE POSTIVE SARS-CoV-2 nucleic acids MAY BE PRESENT.   A presumptive positive result was obtained on the submitted specimen  and confirmed on repeat testing.  While 2019 novel coronavirus  (SARS-CoV-2) nucleic acids may be present in the submitted sample  additional confirmatory testing may be necessary for epidemiological  and / or clinical management purposes  to differentiate between  SARS-CoV-2 and other Sarbecovirus currently known to infect humans.  If clinically indicated additional testing with an alternate test  methodology [phone]) is  advised. The SARS-CoV-2 RNA is generally  detectable in upper and lower respiratory sp ecimens during the acute  phase of infection. The expected result is Negative. Fact Sheet for Patients:  StrictlyIdeas.no Fact Sheet for Healthcare Providers: BankingDealers.co.za This test is not yet approved or cleared by the [place] FDA and has  been authorized for detection and/or diagnosis of SARS-CoV-2 by FDA under an Emergency Use Authorization (EUA).  This EUA will remain in effect (meaning this test can be used) for the duration of the COVID-19 declaration under Section 564(b)(1) of the Act, 21 U.S.C. section 360bbb-3(b)(1), unless the authorization is terminated or revoked sooner. Performed at [facility], [address], [place], [place] [address]   MRSA PCR Screening     Status: None   Collection Time: 09/18/19 11:43 AM   Specimen: Nasal Mucosa; Nasopharyngeal  Result Value Ref Range Status   MRSA by PCR NEGATIVE NEGATIVE Final    Comment:        The GeneXpert MRSA Assay (FDA approved for NASAL specimens only), is one component of a comprehensive MRSA colonization surveillance program. It is not intended to diagnose MRSA infection nor to guide or monitor treatment for MRSA infections. Performed at [facility], [address], [place], [place] [address]      Labs: Basic Metabolic Panel: Recent Labs  Lab 09/19/19 0455 09/20/19 0411 09/21/19 5883 09/22/19 0424 09/23/19 0440 09/24/19 0449 09/25/19 0544  NA 138 140 140 140 140 139 140  K 4.2 4.6 4.0 3.8 3.9 3.8 4.4  CL 108 109 109 110 106 106 106  CO2 20* 25 25 26 25 28 29   GLUCOSE 150* 161* 160* 146* 138* 136* 168*  BUN 27* 34* 20 22 20 19 21   CREATININE 0.84 0.96 0.67 0.83 0.72 0.63 0.81  CALCIUM 8.0* 8.2* 8.1* 8.3* 8.5* 8.6* 8.6*  MG 2.2 2.4 2.3  --  2.2  --  2.2   Liver Function Tests: Recent Labs  Lab 09/19/19 0455 09/20/19 0411 09/21/19 0633 09/22/19 0424  09/23/19 0440  AST 19 18 20 20 16   ALT 23 21 24 26 24   ALKPHOS 72 58 53 52 59  BILITOT 1.0 0.6 0.7 0.8 1.1  PROT 6.1* 6.1* 6.0* 6.3* 6.6  ALBUMIN 2.9* 2.8* 2.9* 3.0* 3.2*   No results for input(s): LIPASE, AMYLASE in the last 168 hours. No results for input(s): AMMONIA in the last 168 hours. CBC: Recent Labs  Lab 09/21/19 0633 09/22/19 0424 09/23/19 0440 09/24/19 0449 09/25/19 0544  WBC 5.4 5.5 6.4 6.4 6.4  NEUTROABS 3.4 3.5 4.1 4.2 4.2  HGB 16.1 15.7 16.8 16.9 16.0  HCT 49.7 49.6 52.2* 51.3 49.1  MCV 96.5 96.7 95.1 94.3 95.2  PLT 162 172 192 194 193   Cardiac Enzymes: No results for input(s): CKTOTAL, CKMB, CKMBINDEX, TROPONINI in the last 168 hours. BNP: Invalid input(s): POCBNP CBG: Recent Labs  Lab 09/24/19 1150 09/24/19 1613 09/24/19 2111 09/25/19 0805 09/25/19 1112  GLUCAP 224* 65* 214* 166* 211*    Time coordinating discharge:  36 minutes  Signed:  Orson Eva, DO [facility] Pager: [phone] 09/25/2019, 1:48 PM

## 2019-09-25 NOTE — Progress Notes (Signed)
Pt is being discharged via wheelchair with oxygen when his wife gets here. Oxygen has already been dropped off in his room. Pts discharge instructions given and pt verbalized understanding with all questions asked. Cardizem to be given right before patient left. IVs have been removed. Will wheel patient out to car when wife gets here.

## 2019-09-25 NOTE — Progress Notes (Signed)
Subjective: He says he feels okay.  His heart rate is better but he still definitely has atrial flutter or fib.  His breathing is good.  He has no other new complaints.  Objective: Vital signs in last 24 hours: Temp:  [98.2 F (36.8 C)-98.7 F (37.1 C)] 98.3 F (36.8 C) (09/27 0807) Pulse Rate:  [76-110] 77 (09/27 0807) Resp:  [11-24] 20 (09/27 0807) BP: (91-131)/(67-93) 124/76 (09/27 0600) SpO2:  [90 %-96 %] 91 % (09/27 0807) Weight:  [104.7 kg] 104.7 kg (09/27 0500) Weight change:  Last BM Date: 09/22/19  Intake/Output from previous day: 09/26 0701 - 09/27 0700 In: 120 [P.O.:120] Out: 1300 [Urine:1300]  PHYSICAL EXAM General appearance: alert, cooperative and no distress Resp: clear to auscultation bilaterally Cardio: Still somewhat irregular with pacing as well GI: soft, non-tender; bowel sounds normal; no masses,  no organomegaly Extremities: extremities normal, atraumatic, no cyanosis or edema  Lab Results:  Results for orders placed or performed during the hospital encounter of 09/18/19 (from the past 48 hour(s))  Glucose, capillary     Status: Abnormal   Collection Time: 09/23/19 11:51 AM  Result Value Ref Range   Glucose-Capillary 190 (H) 70 - 99 mg/dL  Glucose, capillary     Status: Abnormal   Collection Time: 09/23/19  4:41 PM  Result Value Ref Range   Glucose-Capillary 103 (H) 70 - 99 mg/dL  Glucose, capillary     Status: Abnormal   Collection Time: 09/23/19  9:25 PM  Result Value Ref Range   Glucose-Capillary 156 (H) 70 - 99 mg/dL   Comment 1 Notify RN    Comment 2 Document in Chart   Glucose, capillary     Status: Abnormal   Collection Time: 09/24/19  3:13 AM  Result Value Ref Range   Glucose-Capillary 140 (H) 70 - 99 mg/dL  CBC WITH DIFFERENTIAL     Status: None   Collection Time: 09/24/19  4:49 AM  Result Value Ref Range   WBC 6.4 4.0 - 10.5 K/uL   RBC 5.44 4.22 - 5.81 MIL/uL   Hemoglobin 16.9 13.0 - 17.0 g/dL   HCT 51.3 39.0 - 52.0 %   MCV 94.3  80.0 - 100.0 fL   MCH 31.1 26.0 - 34.0 pg   MCHC 32.9 30.0 - 36.0 g/dL   RDW 12.8 11.5 - 15.5 %   Platelets 194 150 - 400 K/uL   nRBC 0.0 0.0 - 0.2 %   Neutrophils Relative % 64 %   Neutro Abs 4.2 1.7 - 7.7 K/uL   Lymphocytes Relative 21 %   Lymphs Abs 1.3 0.7 - 4.0 K/uL   Monocytes Relative 11 %   Monocytes Absolute 0.7 0.1 - 1.0 K/uL   Eosinophils Relative 2 %   Eosinophils Absolute 0.1 0.0 - 0.5 K/uL   Basophils Relative 1 %   Basophils Absolute 0.0 0.0 - 0.1 K/uL   Immature Granulocytes 1 %   Abs Immature Granulocytes 0.03 0.00 - 0.07 K/uL    Comment: Performed at [facility], [address], [place], [place] [address]  Basic metabolic panel     Status: Abnormal   Collection Time: 09/24/19  4:49 AM  Result Value Ref Range   Sodium 139 135 - 145 mmol/L   Potassium 3.8 3.5 - 5.1 mmol/L   Chloride 106 98 - 111 mmol/L   CO2 28 22 - 32 mmol/L   Glucose, Bld 136 (H) 70 - 99 mg/dL   BUN 19 8 - 23 mg/dL  Creatinine, Ser 0.63 0.61 - 1.24 mg/dL   Calcium 8.6 (L) 8.9 - 10.3 mg/dL   GFR calc non Af Amer >60 >60 mL/min   GFR calc Af Amer >60 >60 mL/min   Anion gap 5 5 - 15    Comment: Performed at [facility], [address] [place], [place] [address]  Glucose, capillary     Status: Abnormal   Collection Time: 09/24/19  7:54 AM  Result Value Ref Range   Glucose-Capillary 153 (H) 70 - 99 mg/dL  Glucose, capillary     Status: Abnormal   Collection Time: 09/24/19 11:50 AM  Result Value Ref Range   Glucose-Capillary 224 (H) 70 - 99 mg/dL  Glucose, capillary     Status: Abnormal   Collection Time: 09/24/19  4:13 PM  Result Value Ref Range   Glucose-Capillary 65 (L) 70 - 99 mg/dL   Comment 1 Notify RN    Comment 2 Document in Chart   Glucose, capillary     Status: Abnormal   Collection Time: 09/24/19  9:11 PM  Result Value Ref Range   Glucose-Capillary 214 (H) 70 - 99 mg/dL   Comment 1 Notify RN    Comment 2 Document in Chart   CBC WITH DIFFERENTIAL     Status: None    Collection Time: 09/25/19  5:44 AM  Result Value Ref Range   WBC 6.4 4.0 - 10.5 K/uL   RBC 5.16 4.22 - 5.81 MIL/uL   Hemoglobin 16.0 13.0 - 17.0 g/dL   HCT 49.1 39.0 - 52.0 %   MCV 95.2 80.0 - 100.0 fL   MCH 31.0 26.0 - 34.0 pg   MCHC 32.6 30.0 - 36.0 g/dL   RDW 12.8 11.5 - 15.5 %   Platelets 193 150 - 400 K/uL   nRBC 0.0 0.0 - 0.2 %   Neutrophils Relative % 64 %   Neutro Abs 4.2 1.7 - 7.7 K/uL   Lymphocytes Relative 22 %   Lymphs Abs 1.4 0.7 - 4.0 K/uL   Monocytes Relative 10 %   Monocytes Absolute 0.6 0.1 - 1.0 K/uL   Eosinophils Relative 2 %   Eosinophils Absolute 0.1 0.0 - 0.5 K/uL   Basophils Relative 1 %   Basophils Absolute 0.0 0.0 - 0.1 K/uL   Immature Granulocytes 1 %   Abs Immature Granulocytes 0.04 0.00 - 0.07 K/uL    Comment: Performed at [facility], [address] [place], [place] [address]  Basic metabolic panel     Status: Abnormal   Collection Time: 09/25/19  5:44 AM  Result Value Ref Range   Sodium 140 135 - 145 mmol/L   Potassium 4.4 3.5 - 5.1 mmol/L   Chloride 106 98 - 111 mmol/L   CO2 29 22 - 32 mmol/L   Glucose, Bld 168 (H) 70 - 99 mg/dL   BUN 21 8 - 23 mg/dL   Creatinine, Ser 0.81 0.61 - 1.24 mg/dL   Calcium 8.6 (L) 8.9 - 10.3 mg/dL   GFR calc non Af Amer >60 >60 mL/min   GFR calc Af Amer >60 >60 mL/min   Anion gap 5 5 - 15    Comment: Performed at [facility], [address] [place], [place] [address]  Magnesium     Status: None   Collection Time: 09/25/19  5:44 AM  Result Value Ref Range   Magnesium 2.2 1.7 - 2.4 mg/dL    Comment: Performed at [facility], [address], [place], [place] [address]  Glucose, capillary     Status: Abnormal   Collection Time: 09/25/19  8:05 AM  Result Value Ref Range   Glucose-Capillary 166 (H) 70 - 99 mg/dL    ABGS No results for input(s): PHART, PO2ART, TCO2, HCO3 in the last 72 hours.  Invalid input(s): PCO2 CULTURES Recent Results (from the past 240 hour(s))  Culture, blood (Routine x 2)      Status: None   Collection Time: 09/18/19  7:47 AM   Specimen: BLOOD RIGHT HAND  Result Value Ref Range Status   Specimen Description BLOOD RIGHT HAND  Final   Special Requests   Final    BOTTLES DRAWN AEROBIC AND ANAEROBIC Blood Culture results may not be optimal due to an inadequate volume of blood received in culture bottles   Culture   Final    NO GROWTH 5 DAYS Performed at [facility], [address] [place], [place] [address]    Report Status 09/23/2019 FINAL  Final  Culture, blood (Routine x 2)     Status: None   Collection Time: 09/18/19  7:52 AM   Specimen: Right Antecubital; Blood  Result Value Ref Range Status   Specimen Description RIGHT ANTECUBITAL  Final   Special Requests   Final    Blood Culture results may not be optimal due to an inadequate volume of blood received in culture bottles BOTTLES DRAWN AEROBIC AND ANAEROBIC   Culture   Final    NO GROWTH 5 DAYS Performed at [facility], [address] [place], [place] [address]    Report Status 09/23/2019 FINAL  Final  Urine culture     Status: None   Collection Time: 09/18/19  7:59 AM   Specimen: In/Out Cath Urine  Result Value Ref Range Status   Specimen Description   Final    IN/OUT CATH URINE Performed at [facility], [address], [place], [place] [address]    Special Requests   Final    NONE Performed at [facility], [address] [place], [place] [address]    Culture   Final    NO GROWTH Performed at [facility], [place] [address] [place], [place] [address]    Report Status 09/19/2019 FINAL  Final  SARS Coronavirus 2 [facility] order, Performed in [facility] hospital lab) Nasopharyngeal Nasopharyngeal Swab     Status: None   Collection Time: 09/18/19  8:19 AM   Specimen: Nasopharyngeal Swab  Result Value Ref Range Status   SARS Coronavirus 2 NEGATIVE NEGATIVE Final    Comment: (NOTE) If result is NEGATIVE SARS-CoV-2 target nucleic acids are NOT DETECTED. The SARS-CoV-2 RNA is generally  detectable in upper and lower  respiratory specimens during the acute phase of infection. The lowest  concentration of SARS-CoV-2 viral copies this assay can detect is 250  copies / mL. A negative result does not preclude SARS-CoV-2 infection  and should not be used as the sole basis for treatment or other  patient management decisions.  A negative result may occur with  improper specimen collection / handling, submission of specimen other  than nasopharyngeal swab, presence of viral mutation(s) within the  areas targeted by this assay, and inadequate number of viral copies  (<250 copies / mL). A negative result must be combined with clinical  observations, patient history, and epidemiological information. If result is POSITIVE SARS-CoV-2 target nucleic acids are DETECTED. The SARS-CoV-2 RNA is generally detectable in upper and lower  respiratory specimens dur ing the acute phase of infection.  Positive  results are indicative of active infection with SARS-CoV-2.  Clinical  correlation with patient history and other diagnostic information is  necessary to determine patient infection status.  Positive results do  not rule out bacterial infection or co-infection with other viruses. If result is PRESUMPTIVE POSTIVE SARS-CoV-2 nucleic acids MAY BE PRESENT.   A presumptive positive result was obtained on the submitted specimen  and confirmed on repeat testing.  While 2019 novel coronavirus  (SARS-CoV-2) nucleic acids may be present in the submitted sample  additional confirmatory testing may be necessary for epidemiological  and / or clinical management purposes  to differentiate between  SARS-CoV-2 and other Sarbecovirus currently known to infect humans.  If clinically indicated additional testing with an alternate test  methodology [phone]) is advised. The SARS-CoV-2 RNA is generally  detectable in upper and lower respiratory sp ecimens during the acute  phase of infection. The  expected result is Negative. Fact Sheet for Patients:  StrictlyIdeas.no Fact Sheet for Healthcare Providers: BankingDealers.co.za This test is not yet approved or cleared by the [place] FDA and has been authorized for detection and/or diagnosis of SARS-CoV-2 by FDA under an Emergency Use Authorization (EUA).  This EUA will remain in effect (meaning this test can be used) for the duration of the COVID-19 declaration under Section 564(b)(1) of the Act, 21 U.S.C. section 360bbb-3(b)(1), unless the authorization is terminated or revoked sooner. Performed at [facility], [address], [place], [place] [address]   MRSA PCR Screening     Status: None   Collection Time: 09/18/19 11:43 AM   Specimen: Nasal Mucosa; Nasopharyngeal  Result Value Ref Range Status   MRSA by PCR NEGATIVE NEGATIVE Final    Comment:        The GeneXpert MRSA Assay (FDA approved for NASAL specimens only), is one component of a comprehensive MRSA colonization surveillance program. It is not intended to diagnose MRSA infection nor to guide or monitor treatment for MRSA infections. Performed at [facility], [address], [place], [place] [address]    Studies/Results: No results found.  Medications:  Prior to Admission:  Facility-Administered Medications Prior to Admission  Medication Dose Route Frequency Provider Last Rate Last Dose  . omalizumab Arvid Right) injection 300 mg  300 mg Subcutaneous Q14 Days [name], MD   300 mg at 02/09/19 1519   Medications Prior to Admission  Medication Sig Dispense Refill Last Dose  . acetaminophen (TYLENOL) 500 MG tablet Take 1,000 mg by mouth 3 (three) times daily.   09/18/2019 at 0415  . Aclidinium Bromide (TUDORZA PRESSAIR) 400 MCG/ACT AEPB 1 puff BID 1 each 2 09/17/2019 at Unknown time  . albuterol (ACCUNEB) 0.63 MG/3ML nebulizer solution Take 3 mLs by nebulization every 4 (four) hours as needed for  shortness of breath.     Marland Kitchen albuterol (PROVENTIL HFA;VENTOLIN HFA) 108 (90 Base) MCG/ACT inhaler Inhale 2 puffs every 4 (four) hours as needed into the lungs for shortness of breath (only if you can't catch your breath).     Marland Kitchen amLODipine (NORVASC) 5 MG tablet Take 1 tablet (5 mg total) by mouth daily. 90 tablet 3 09/17/2019 at Unknown time  . bisoprolol (ZEBETA) 5 MG tablet TAKE 1 AND 1/2 TABLET BY MOUTH TWICE DAILY. 90 tablet 3 09/17/2019 at Unknown time  . canagliflozin (INVOKANA) 300 MG TABS tablet Take 300 mg by mouth daily before breakfast.   09/17/2019 at Unknown time  . Carboxymethylcellul-Glycerin (LUBRICATING EYE DROPS OP) Place 1 drop into the right  eye daily as needed (dry eyes).   09/17/2019 at Unknown time  . cloNIDine (CATAPRES) 0.1 MG tablet Take 0.1 mg by mouth 2 (two) times daily.   09/17/2019 at Unknown time  . diphenhydramine-acetaminophen (TYLENOL PM) 25-500 MG TABS tablet Take 1 tablet by mouth at bedtime.     Marland Kitchen EPINEPHrine 0.3 mg/0.3 mL IJ SOAJ injection Inject 0.3 mg into the muscle as needed for anaphylaxis.    Past Month at Unknown time  . flecainide (TAMBOCOR) 150 MG tablet Take 0.5 tablets (75 mg total) by mouth 2 (two) times daily. 90 tablet 3 09/17/2019 at Unknown time  . glipiZIDE (GLUCOTROL XL) 5 MG 24 hr tablet Take 5 mg by mouth daily before breakfast.   09/17/2019 at Unknown time  . guaiFENesin (MUCINEX) 600 MG 12 hr tablet Take 600 mg by mouth 2 (two) times daily.    09/17/2019 at Unknown time  . hydrocortisone cream 1 % Apply 1 application topically daily as needed for itching.     . levothyroxine (SYNTHROID, LEVOTHROID) 137 MCG tablet Take 137 mcg by mouth daily before breakfast.    09/17/2019 at Unknown time  . losartan-hydrochlorothiazide (HYZAAR) 100-25 MG per tablet Take 1 tablet by mouth daily.   09/17/2019 at Unknown time  . metFORMIN (GLUCOPHAGE-XR) 500 MG 24 hr tablet Take 1,000 mg by mouth at bedtime.    09/17/2019 at Unknown time  . montelukast (SINGULAIR) 10 MG  tablet TAKE (1) TABLET BY MOUTH AT BEDTIME. 30 tablet 0 09/17/2019 at Unknown time  . Multiple Vitamin (MULTIVITAMIN) tablet Take 1 tablet by mouth daily.     09/17/2019 at Unknown time  . omeprazole (PRILOSEC OTC) 20 MG tablet Take 20 mg by mouth daily.    09/17/2019 at Unknown time  . rivaroxaban (XARELTO) 20 MG TABS tablet Take 20 mg daily with supper by mouth.   09/17/2019 at Unknown time  . voriconazole (VFEND) 200 MG tablet Take 1 tablet (200 mg total) by mouth 2 (two) times daily. 180 tablet 0 09/17/2019 at Unknown time  . WIXELA INHUB 500-50 MCG/DOSE AEPB INHALE 1 PUFF INTO THE LUNGS TWICE DAILY. RINSE MOUTH AFTER USE. 180 each 0 09/17/2019 at Unknown time  . XOLAIR 150 MG/ML prefilled syringe INJECT 300MG  SUBCUTANEOUSLY EVERY 4 WEEKS (GIVEN AT MD  OFFICE) 4 mL 5 Past Month at Unknown time  . Respiratory Therapy Supplies (FLUTTER) DEVI USE AS DIRECTED (Patient not taking: Reported on 09/18/2019) 1 each 0 Not Taking at Unknown time   Scheduled: . amoxicillin-clavulanate  1 tablet Oral Q12H  . bisoprolol  7.5 mg Oral BID  . Chlorhexidine Gluconate Cloth  6 each Topical Daily  . diltiazem  30 mg Oral Q6H  . flecainide  75 mg Oral BID  . guaiFENesin  1,200 mg Oral BID  . insulin aspart  0-20 Units Subcutaneous TID WC  . insulin aspart  0-5 Units Subcutaneous QHS  . insulin aspart  5 Units Subcutaneous TID WC  . ipratropium-albuterol  3 mL Nebulization Q6H  . levothyroxine  137 mcg Oral QAC breakfast  . mouth rinse  15 mL Mouth Rinse q12n4p  . mometasone-formoterol  2 puff Inhalation BID  . montelukast  10 mg Oral QHS  . multivitamin with minerals  1 tablet Oral Daily  . pantoprazole  40 mg Oral BID AC  . rivaroxaban  20 mg Oral Q supper  . voriconazole  200 mg Oral BID   Continuous:  [id number], bisacodyl, diphenhydrAMINE, haloperidol lactate, LORazepam, ondansetron **OR** ondansetron (ZOFRAN)  IV, sodium chloride, traMADol, traZODone  Assesment: He was admitted with  community-acquired pneumonia severe sepsis and hypoxic respiratory failure.  He has resolved sepsis pathophysiology.  He still coughing up some sputum.  He is still requiring oxygen  He had altered mental status metabolic encephalopathy on admission.  He has developed atrial fib with rapid ventricular response and he is being treated for that.  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  He has severe persistent asthma at baseline and I think he is probably about at his baseline  He has sleep apnea but is intolerant of CPAP  He has diabetes which is being treated     Principal Problem:   Acute on chronic respiratory failure ([place]) Active Problems:   Essential hypertension, benign   Premature ventricular contractions   PPM-St.Jude   PVC's (premature ventricular contractions)   Sinoatrial node dysfunction (HCC)   Persistent atrial fibrillation   Hyperthyroidism   Hyperlipidemia   RBBB (right bundle branch block)   Carotid artery stenosis   Type 2 diabetes mellitus (HCC)   OSA (obstructive sleep apnea)   Severe persistent asthma   CAP (community acquired pneumonia)   Chronic bronchitis (HCC)   Severe sepsis (HCC)   Elevated lactic acid level   GERD (gastroesophageal reflux disease)   Lobar pneumonia (HCC)   Acute respiratory failure with hypoxia (HCC)   Paroxysmal A-fib (HCC)   Atrial fibrillation with RVR ([place])    Plan: Continue current treatments.  Primary problem now appears to be cardiac so I will follow more peripherally    LOS: 7 days   Alonza Bogus 09/25/2019, 8:18 AM

## 2019-09-26 ENCOUNTER — Telehealth: Payer: Self-pay | Admitting: Internal Medicine

## 2019-09-26 NOTE — Telephone Encounter (Signed)
Spoke with pt. He needs to cancel his Xolair injection at this time. He will call back to reschedule. Nothing further was needed.

## 2019-09-30 ENCOUNTER — Ambulatory Visit: Payer: Medicare Other

## 2019-09-30 ENCOUNTER — Encounter (HOSPITAL_COMMUNITY): Payer: Self-pay | Admitting: Physician Assistant

## 2019-09-30 ENCOUNTER — Ambulatory Visit (HOSPITAL_COMMUNITY)
Admission: RE | Admit: 2019-09-30 | Discharge: 2019-09-30 | Disposition: A | Discharge status: Home / Self Care | Payer: Medicare Other | Source: Ambulatory Visit | Attending: Nurse Practitioner | Admitting: Nurse Practitioner

## 2019-09-30 ENCOUNTER — Other Ambulatory Visit: Payer: Self-pay

## 2019-09-30 VITALS — BP 138/88 | HR 77 | Ht 68.0 in | Wt 229.0 lb

## 2019-09-30 DIAGNOSIS — I4510 Unspecified right bundle-branch block: Secondary | ICD-10-CM | POA: Insufficient documentation

## 2019-09-30 DIAGNOSIS — Z79899 Other long term (current) drug therapy: Secondary | ICD-10-CM | POA: Diagnosis not present

## 2019-09-30 DIAGNOSIS — I495 Sick sinus syndrome: Secondary | ICD-10-CM | POA: Diagnosis not present

## 2019-09-30 DIAGNOSIS — K219 Gastro-esophageal reflux disease without esophagitis: Secondary | ICD-10-CM | POA: Insufficient documentation

## 2019-09-30 DIAGNOSIS — E669 Obesity, unspecified: Secondary | ICD-10-CM | POA: Insufficient documentation

## 2019-09-30 DIAGNOSIS — E118 Type 2 diabetes mellitus with unspecified complications: Secondary | ICD-10-CM | POA: Diagnosis not present

## 2019-09-30 DIAGNOSIS — I10 Essential (primary) hypertension: Secondary | ICD-10-CM | POA: Insufficient documentation

## 2019-09-30 DIAGNOSIS — Z6834 Body mass index (BMI) 34.0-34.9, adult: Secondary | ICD-10-CM | POA: Diagnosis not present

## 2019-09-30 DIAGNOSIS — I480 Paroxysmal atrial fibrillation: Secondary | ICD-10-CM | POA: Diagnosis present

## 2019-09-30 DIAGNOSIS — Z87891 Personal history of nicotine dependence: Secondary | ICD-10-CM | POA: Insufficient documentation

## 2019-09-30 DIAGNOSIS — G4733 Obstructive sleep apnea (adult) (pediatric): Secondary | ICD-10-CM | POA: Insufficient documentation

## 2019-09-30 DIAGNOSIS — E039 Hypothyroidism, unspecified: Secondary | ICD-10-CM | POA: Diagnosis not present

## 2019-09-30 DIAGNOSIS — M1711 Unilateral primary osteoarthritis, right knee: Secondary | ICD-10-CM | POA: Insufficient documentation

## 2019-09-30 DIAGNOSIS — E785 Hyperlipidemia, unspecified: Secondary | ICD-10-CM | POA: Insufficient documentation

## 2019-09-30 DIAGNOSIS — Z7984 Long term (current) use of oral hypoglycemic drugs: Secondary | ICD-10-CM | POA: Insufficient documentation

## 2019-09-30 DIAGNOSIS — J45909 Unspecified asthma, uncomplicated: Secondary | ICD-10-CM | POA: Diagnosis not present

## 2019-09-30 MED ORDER — DILTIAZEM HCL ER COATED BEADS 120 MG PO CP24: 120.0000 mg | ORAL_CAPSULE | Freq: Every day | ORAL | 6 refills | Status: DC

## 2019-09-30 NOTE — Progress Notes (Signed)
Primary Care Physician: [name], MD Primary Electrophysiologist: Dr Lovena Le Referring Physician: Dr Carlos Levering is a 73 y.o. male with a history of SSS (s/p PPM placement with gen change in 2014), paroxysmal atrial fibrillation, chronic diastolic CHF, HTN, COPD, DM, and GERD who presents for follow up in the [facility]. Patient was recently admitted with elevated temp and AMS and was diagnosed with severe sepsis 2/2 pneumonia. During his admission, he developed afib with RVR. He was given a bolus of flecainide which converted him to atrial flutter. Patient was fairly asymptomatic.   On follow up today, patient reports that he has done reasonably well. He noted two days ago that he felt he was back in SR. ECG today confirms A-paced rhythm. He continues to recover from his recent hospitalization.   Today, he denies symptoms of palpitations, chest pain, shortness of breath, orthopnea, PND, lower extremity edema, dizziness, presyncope, syncope, snoring, daytime somnolence, bleeding, or neurologic sequela. The patient is tolerating medications without difficulties and is otherwise without complaint today.    Atrial Fibrillation Risk Factors:  he does not have symptoms or diagnosis of sleep apnea. he does not have a history of rheumatic fever. he does not have a history of alcohol use. The patient does not have a history of early familial atrial fibrillation or other arrhythmias.  he has a BMI of Body mass index is 34.82 kg/m.Marland Kitchen Filed Weights   09/30/19 1013  Weight: 103.9 kg    Family History  Problem Relation Age of Onset  . Other Father 47       Sudden Cardiac death  . Pancreatic cancer Mother   . Colon cancer Mother   . Colon cancer Maternal Aunt   . Colon polyps Neg Hx      Atrial Fibrillation Management history:  Previous antiarrhythmic drugs: flecainide Previous cardioversions: 11/2016 Previous ablations: none CHADS2VASC  score: 3 Anticoagulation history: Xarelto   Past Medical History:  Diagnosis Date  . Allergic rhinitis   . Aortic valve disorder   . Asthma    since childhood- seasonal allergies induced  . Cancer ([place])    Skin cancer- squamous, basal  . Carotid artery stenosis   . Essential hypertension   . Full dentures   . GERD (gastroesophageal reflux disease)   . H/O hiatal hernia   . Hemorrhage of rectum   . Hyperlipidemia   . Hypothyroidism   . 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 OSA (obstructive sleep apnea)   . Osteoarthritis   . Pacemaker    Oct 2005 in [place].  Marland Kitchen PAF (paroxysmal atrial fibrillation) ([place])   . Pneumonia    "several Times" 2015 last time  . Primary localized osteoarthritis of right knee 08/11/2017  . RBBB (right bundle branch block)   . Sinoatrial node dysfunction ([facility])   . Syncope   . Tricuspid valve disorder   . Type 2 diabetes mellitus ([place])    Type II   Past Surgical History:  Procedure Laterality Date  . A-V CARDIAC PACEMAKER INSERTION     Sick sinus syndrome DDR pacer  . Arthropathy Right 2005   Rebuilding of left thumb and joint   . CARDIAC CATHETERIZATION    . CARDIAC ELECTROPHYSIOLOGY STUDY AND ABLATION  09/2008   for pvcs, Dr. Loralie Champagne  . CARDIOVERSION N/A 12/18/2016   Procedure: CARDIOVERSION;  Surgeon: Pixie Casino, MD;  Location: [facility] ENDOSCOPY;  Service: Cardiovascular;  Laterality: N/A;  . CARPAL TUNNEL RELEASE  1994   right wrist  . CARPAL TUNNEL RELEASE  05/04/2012   Procedure: CARPAL TUNNEL RELEASE;  Surgeon: Wynonia Sours, MD;  Location: [place];  Service: Orthopedics;  Laterality: Left;  . CARPOMETACARPEL SUSPENSION PLASTY Right 11/16/2014   Procedure: SUSPENSIONPLASTY RIGHT THUMB TENDON TRANSFER ABDUCTOR POLLICUS LONGUS EXCISION TRAPEZIUM;  Surgeon: Daryll Brod, MD;  Location: [place];  Service: Orthopedics;  Laterality: Right;  . CHOLECYSTECTOMY  1994  . CIRCUMCISION    . COLONOSCOPY N/A 03/14/2013   Procedure:  COLONOSCOPY;  Surgeon: Daneil Dolin, MD;  [facility];  Service: Endoscopy;  Laterality: N/A;  8:15 AM  . 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 EYE SURGERY  2012   Left eye Corneal transplant- partial- Cataract  . FLEXIBLE BRONCHOSCOPY Right 06/23/2019   Procedure: FLEXIBLE BRONCHOSCOPY RIGHT;  Surgeon: [name], MD;  Location: [facility] ORS;  Service: Pulmonary;  Laterality: Right;  . GALLBLADDER SURGERY  12/01/2006  . HAND TENDON SURGERY Left late 1990's   thumb  . HEMORROIDECTOMY  2003  . INJECTION KNEE Left 08/26/2017   Procedure: LEFT KNEE INJECTION;  Surgeon: Elsie Saas, MD;  Location: [place];  Service: Orthopedics;  Laterality: Left;  . left Knee Arthroscopy     [facility]  . PARTIAL KNEE ARTHROPLASTY  11/22/2012   Procedure: UNICOMPARTMENTAL KNEE;  Surgeon: Lorn Junes, MD;  Location: [place];  Service: Orthopedics;  Laterality: Left;  left unicompartmental knee arthroplasty  . PERMANENT PACEMAKER GENERATOR CHANGE N/A 01/12/2013   Procedure: PERMANENT PACEMAKER GENERATOR CHANGE;  Surgeon: Evans Lance, MD;  Location: [facility] CATH LAB;  Service: Cardiovascular;  Laterality: N/A;  . Rotator cuff Surgery  2001   Right shoulder  . TONSILLECTOMY    . TOTAL KNEE ARTHROPLASTY Right 08/26/2017   Procedure: TOTAL KNEE ARTHROPLASTY;  Surgeon: Elsie Saas, MD;  Location: [place];  Service: Orthopedics;  Laterality: Right;  . varicose vein reduction      Current Outpatient Medications  Medication Sig Dispense Refill  . acetaminophen (TYLENOL) 500 MG tablet Take 1,000 mg by mouth 3 (three) times daily.    . Aclidinium Bromide (TUDORZA PRESSAIR) 400 MCG/ACT AEPB 1 puff BID 1 each 2  . albuterol (ACCUNEB) 0.63 MG/3ML nebulizer solution Take 3 mLs by nebulization every 4 (four) hours as needed for shortness of breath.    Marland Kitchen albuterol (PROVENTIL HFA;VENTOLIN HFA) 108 (90 Base) MCG/ACT inhaler Inhale 2 puffs every 4 (four) hours  as needed into the lungs for shortness of breath (only if you can't catch your breath).    . bisoprolol (ZEBETA) 5 MG tablet TAKE 1 AND 1/2 TABLET BY MOUTH TWICE DAILY. 90 tablet 3  . canagliflozin (INVOKANA) 300 MG TABS tablet Take 300 mg by mouth daily before breakfast.    . Carboxymethylcellul-Glycerin (LUBRICATING EYE DROPS OP) Place 1 drop into the right eye daily as needed (dry eyes).    Marland Kitchen diltiazem (CARDIZEM CD) 120 MG 24 hr capsule Take 1 capsule (120 mg total) by mouth daily. 30 capsule 6  . diphenhydramine-acetaminophen (TYLENOL PM) 25-500 MG TABS tablet Take 1 tablet by mouth at bedtime.    Marland Kitchen EPINEPHrine 0.3 mg/0.3 mL IJ SOAJ injection Inject 0.3 mg into the muscle as needed for anaphylaxis.     . flecainide (TAMBOCOR) 150 MG tablet Take 0.5 tablets (75 mg total) by mouth 2 (two) times daily. 90 tablet 3  . glipiZIDE (GLUCOTROL XL) 5  MG 24 hr tablet Take 5 mg by mouth daily before breakfast.    . guaiFENesin (MUCINEX) 600 MG 12 hr tablet Take 600 mg by mouth 2 (two) times daily.     . hydrocortisone cream 1 % Apply 1 application topically daily as needed for itching.    . levothyroxine (SYNTHROID, LEVOTHROID) 137 MCG tablet Take 137 mcg by mouth daily before breakfast.     . metFORMIN (GLUCOPHAGE-XR) 500 MG 24 hr tablet Take 1,000 mg by mouth at bedtime.     . montelukast (SINGULAIR) 10 MG tablet TAKE (1) TABLET BY MOUTH AT BEDTIME. 30 tablet 0  . Multiple Vitamin (MULTIVITAMIN) tablet Take 1 tablet by mouth daily.      Marland Kitchen omeprazole (PRILOSEC OTC) 20 MG tablet Take 20 mg by mouth daily.     . rivaroxaban (XARELTO) 20 MG TABS tablet Take 20 mg daily with supper by mouth.    . voriconazole (VFEND) 200 MG tablet Take 1 tablet (200 mg total) by mouth 2 (two) times daily. 180 tablet 0  . WIXELA INHUB 500-50 MCG/DOSE AEPB INHALE 1 PUFF INTO THE LUNGS TWICE DAILY. RINSE MOUTH AFTER USE. 180 each 0  . XOLAIR 150 MG/ML prefilled syringe INJECT 300MG SUBCUTANEOUSLY EVERY 4 WEEKS (GIVEN AT MD   OFFICE) 4 mL 5   No current facility-administered medications for this encounter.     Allergies  Allergen Reactions  . Food Anaphylaxis and Shortness Of Breath    TREE NUTS  . Iodinated Diagnostic Agents Hives and Shortness Of Breath    Patient states hives to throat closing. (01/15/17: patient states this reaction was "about 20 years ago" with possibly an IVP.  He now says high doses of prednisone "throw me into AFib."  He has tolerated CT arthrograms with Benadrly 56m PO one hour before injection.  JBrita Romp RN)  . Shellfish Allergy Anaphylaxis and Shortness Of Breath    To shellfish, crabs.  Makes him feel like "things are crawling all over" me.  Denies airway issues with these foods.  (Brita Romp RN 01/15/17)  . Goat-Derived PChild psychotherapist  . Prednisone Palpitations    PRECIPITATES A-FIB  . Metformin And Related Diarrhea    High doses at once  . Vancomycin Anxiety  . Voltaren [Diclofenac Sodium] Other (See Comments)    Feels like things are crawling on him    Social History   Socioeconomic History  . Marital status: Married    Spouse name: Not on file  . Number of children: Not on file  . Years of education: Not on file  . Highest education level: Not on file  Occupational History  . Not on file  Social Needs  . Financial resource strain: Not on file  . Food insecurity    Worry: Not on file    Inability: Not on file  . Transportation needs    Medical: Not on file    Non-medical: Not on file  Tobacco Use  . Smoking status: Former Smoker    Packs/day: 1.00    Years: 25.00    Pack years: 25.00    Types: Cigarettes    Start date: 02/20/1958    Quit date: 02/12/1984    Years since quitting: 35.6  . Smokeless tobacco: Former [employer] developer   Types: Chew  Substance and Sexual Activity  . Alcohol use: Yes    Alcohol/week: 4.0 standard drinks    Types: 4 Glasses of wine per week  .  Drug use: No  . Sexual activity: Not on file  Lifestyle  . Physical  activity    Days per week: Not on file    Minutes per session: Not on file  . Stress: Not on file  Relationships  . Social Herbalist on phone: Not on file    Gets together: Not on file    Attends religious service: Not on file    Active member of club or organization: Not on file    Attends meetings of clubs or organizations: Not on file    Relationship status: Not on file  . Intimate partner violence    Fear of current or ex partner: Not on file    Emotionally abused: Not on file    Physically abused: Not on file    Forced sexual activity: Not on file  Other Topics Concern  . Not on file  Social History Narrative   Regular exercise: No     ROS- All systems are reviewed and negative except as per the HPI above.  Physical Exam: Vitals:   09/30/19 1013  BP: 138/88  Pulse: 77  Weight: 103.9 kg  Height: _0  (1.727 m)    GEN- The patient is well appearing elderly obese male, alert and oriented x 3 today.   Head- normocephalic, atraumatic Eyes-  Sclera clear, conjunctiva pink Ears- hearing intact Oropharynx- clear Neck- supple  Lungs- mild wheezing, O2 nasal canula, normal work of breathing Heart- Regular rate and rhythm, no murmurs, rubs or gallops  GI- soft, NT, ND, + BS Extremities- no clubbing, cyanosis, or edema MS- no significant deformity or atrophy Skin- no rash or lesion Psych- euthymic mood, full affect Neuro- strength and sensation are intact  Wt Readings from Last 3 Encounters:  09/30/19 103.9 kg  09/25/19 104.7 kg  09/12/19 105.9 kg    EKG today demonstrates A-paced rhythm HR 77, RBBB, 1st degree AV block, PR 234, QRS 172, QTc 502  Echo 10/21/16 demonstrated  - Left ventricle: The cavity size was normal. Wall thickness was   increased in a pattern of mild LVH. Systolic function was normal.   The estimated ejection fraction was in the range of 60% to 65%.   Wall motion was normal; there were no regional wall motion   abnormalities.  Features are consistent with a pseudonormal left   ventricular filling pattern, with concomitant abnormal relaxation   and increased filling pressure (grade 2 diastolic dysfunction). - Aortic valve: Mildly to moderately calcified annulus. Moderately   calcified leaflets. There was mild to moderate stenosis. Valve   area (VTI): 1.3 cm^2. - Mitral valve: Calcified annulus. There was trivial regurgitation. - Left atrium: The atrium was mildly to moderately dilated. - Right ventricle: Pacer wire or catheter noted in right ventricle. - Right atrium: Central venous pressure (est): 3 mm Hg. - Tricuspid valve: There was trivial regurgitation. - Pulmonary arteries: PA peak pressure: 48 mm Hg (S). - Pericardium, extracardiac: A prominent pericardial fat pad was   present.  Impressions:  - Mild LVH with LVEF 60-65%. Grade 2 diastolic dysfunction. Mild to   moderate left atrial enlargement. Mildly calcified mitral annulus   with trivial mitral regurgitation. Moderately calcified aortic   valve, leaflet structure difficult to identify. There is evidence   of mild to moderate aortic stenosis. Device wire present in the   right heart. Trivial tricuspid regurgitation with PASP 48 mmHg.  Epic records are reviewed at length today  Assessment and Plan:  1. Paroxysmal atrial fibrillation Recent episode likely triggered by other severe medical issues. Previously very well controlled. He is back in A-paced rhythm today. Continue flecainide 75 mg BID Continue diltiazem 120 mg daily Continue bisoprolol 7.5 mg BID Continue Xarelto 20 mg daily Lifestyle modification as below.   This patients CHA2DS2-VASc Score and unadjusted Ischemic Stroke Rate (% per year) is equal to 3.2 % stroke rate/year from a score of 3  Above score calculated as 1 point each if present [CHF, HTN, DM, Vascular=MI/PAD/Aortic Plaque, Age if 65-74, or Male] Above score calculated as 2 points each if present [Age > 75, or  Stroke/TIA/TE]   2. Obesity Body mass index is 34.82 kg/m. Lifestyle modification was discussed at length including regular exercise and weight reduction.  3. SSS S/p PPM, followed by Dr Lovena Le and the Device clinic.  4. HTN Stable, no med changes today.    Follow up in the AF clinic in 3 months.   [facility] [address] [place], [place] [address] [phone] 09/30/2019 12:48 PM

## 2019-10-10 ENCOUNTER — Other Ambulatory Visit: Admission: RE | Admit: 2019-10-10 | Payer: Medicare Other | Source: Ambulatory Visit | Admitting: *Deleted

## 2019-10-10 ENCOUNTER — Ambulatory Visit: Payer: Medicare Other | Admitting: Pulmonary Disease

## 2019-10-10 ENCOUNTER — Encounter: Payer: Self-pay | Admitting: Pulmonary Disease

## 2019-10-10 ENCOUNTER — Other Ambulatory Visit: Payer: Self-pay

## 2019-10-10 ENCOUNTER — Telehealth: Payer: Self-pay | Admitting: Internal Medicine

## 2019-10-10 VITALS — BP 126/72 | HR 75 | Temp 98.3°F | Ht 68.0 in | Wt 228.5 lb

## 2019-10-10 DIAGNOSIS — I4819 Other persistent atrial fibrillation: Secondary | ICD-10-CM | POA: Diagnosis not present

## 2019-10-10 DIAGNOSIS — J9601 Acute respiratory failure with hypoxia: Secondary | ICD-10-CM

## 2019-10-10 DIAGNOSIS — J449 Chronic obstructive pulmonary disease, unspecified: Secondary | ICD-10-CM | POA: Diagnosis not present

## 2019-10-10 DIAGNOSIS — J181 Lobar pneumonia, unspecified organism: Secondary | ICD-10-CM

## 2019-10-10 DIAGNOSIS — J9819 Other pulmonary collapse: Secondary | ICD-10-CM

## 2019-10-10 MED ORDER — QVAR REDIHALER 80 MCG/ACT IN AERB: 2.0000 | INHALATION_SPRAY | Freq: Two times a day (BID) | RESPIRATORY_TRACT | 6 refills | Status: DC

## 2019-10-10 MED ORDER — DUAKLIR PRESSAIR 400-12 MCG/ACT IN AEPB: 1.0000 | INHALATION_SPRAY | Freq: Two times a day (BID) | RESPIRATORY_TRACT | 0 refills | Status: DC

## 2019-10-10 MED ORDER — FLUTTER DEVI: 0 refills | Status: DC

## 2019-10-10 NOTE — Patient Instructions (Signed)
1.  We will collect sputum sample to determine if there is any active infection going on.  2.  We will discontinue [place] and Wixella.  3.  You will be given a trial of Duaklir 1 puff twice a day this is a combination of Tudorza and formoterol.  4.  You will take Qvar 80 mcg 2 puffs twice a day this is to control inflammation in your airways.  5.  Use Acapella flutter valve at least 3 times a day.  This will help clear your airways.  6.  We will see you in follow-up in 3 to 4 weeks time.  Let us know how the [facility] Client works for you so we can send prescription in.

## 2019-10-10 NOTE — Telephone Encounter (Signed)
Called and spoke with Patient.  Patient stated he cancelled his Xolair injection earlier this month.  Patient scheduled 10/11/19 at 1100.  Nothing further at this time.

## 2019-10-10 NOTE — Progress Notes (Signed)
Subjective:    Patient ID: Clarence Dawson, male    DOB: 1946-04-10, 73 y.o.   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  He is also known to have right middle lobe syndrome.  In the past has had "high" Aspergillus titers and has been treated with voriconazole.  The patient is currently on [place].  Previously he followed with Dr. Ashby Dawes who is no longer in our practice.  I am assuming care.  Patient was admitted to [facility] on 21 September with pneumonia and severe sepsis.  He had multiorgan failure and encephalopathy.  He was discharged on 27 September.  He is still requiring oxygen at 3 L/min since that admission.  Patient is also noted to have a history of obstructive sleep apnea but is intolerant of CPAP therefore does not use it.  During his admission he had blood cultures and urine culture however no sputum culture.  He last saw Dr. Ashby Dawes on 12 September 2019.  He has been treated with Vfend based on the Aspergillus antigen on BAL and not an actual fungal culture which was negative.  I have not seen significantly elevated IgE's on his laboratory data.  He has had bronchoscopy as noted and it showed findings consistent with right middle lobe syndrome and mucous plugging on the right middle lobe.  Since his discharge he continues to have copious amounts of sputum, he has not been using an airway clearance device.  He notes that guaifenesin helps him some with sputum clearance.  He has not had any fevers, chills or sweats.  He has been compliant with oxygen use.  He notes that he gets more relief with Tudorza than with the [place].  He is up-to-date on flu vaccine.  He voices no other complaint today.  No recent palpitations (he has a history of paroxysmal atrial fibrillation) no chest pain, orthopnea  or paroxysmal nocturnal dyspnea.   Review of Systems  Constitutional: Positive for fatigue.  HENT: Negative.   Eyes: Negative.   Respiratory: Positive for cough and shortness of breath.   Cardiovascular: Negative.   Gastrointestinal: Negative.   Endocrine: Negative.   Genitourinary: Negative.   Musculoskeletal: Negative.   Skin: Positive for rash (With sun exposure on Vfend, arms).  Allergic/Immunologic: Positive for environmental allergies.  Neurological: Negative.   Hematological: Negative.   Psychiatric/Behavioral: Negative.   All other systems reviewed and are negative.      Objective:   Physical Exam Vitals signs and nursing note reviewed.  Constitutional:      General: He is not in acute distress.    Appearance: Normal appearance. He is well-groomed.     Comments: Overweight  HENT:     Head: Normocephalic and atraumatic.     Right Ear: External ear normal.     Left Ear: External ear normal.     Nose:     Comments: Nose/mouth/throat not examined due to masking requirements for COVID 19. Eyes:     General: No scleral icterus.    Conjunctiva/sclera: Conjunctivae normal.     Pupils: Pupils are equal, round, and reactive to light.  Neck:     Musculoskeletal: Neck supple.     Thyroid: No thyromegaly.     Vascular: No JVD.     Trachea: Trachea and phonation normal.  Cardiovascular:  Rate and Rhythm: Normal rate. Rhythm irregular.     Pulses: Normal pulses.     Heart sounds: Normal heart sounds.  Pulmonary:     Effort: Pulmonary effort is normal. No respiratory distress.     Breath sounds: No wheezing, rhonchi or rales.     Comments: Coarse breath sounds Abdominal:     General: There is no distension.     Palpations: Abdomen is soft.  Musculoskeletal: Normal range of motion.     Right lower leg: No edema.     Left lower leg: No edema.  Lymphadenopathy:     Cervical: No cervical adenopathy.  Skin:    General: Skin is warm and dry.     Findings: Rash (Both  forearms, pinkish rash.) present.  Neurological:     General: No focal deficit present.     Mental Status: He is alert and oriented to person, place, and time.  Psychiatric:        Mood and Affect: Mood normal.        Behavior: Behavior normal.         Assessment & Plan:   1.  Asthma-COPD overlap syndrome: He does not appear to be well compensated after his recent admission.  He notes that Caprice Renshaw brings in the most relief.  We will switch him to [facility] which is a combination of Tudorza and formoterol he will do this 1 inhalation twice a day.  Then we will follow this with Qvar 80 mcg 2 puffs twice a day.  Discontinue Tudorza and [employer]  2.  Recent lobar pneumonia, no organism specified: Continues to bring up significant amount of sputum, in the past he showed colonization with Pseudomonas, proceed with obtaining sputum culture.  3.  Right middle lobe syndrome: This is a very common finding with regards to atelectasis.  Recall that the right middle lobe only constitutes 10% of the total lung volume.  This should have little impact on the rest of his respiratory issues.  During bronchoscopy no mass was noted.  Mostly mucus plugging.  Management will be geared towards airway clearance.  Have recommended resuming Acapella flutter valve.  This was provided for the patient.  4.  Acute respiratory failure with hypoxia: He is to continue oxygen as is for now.  Oxygen saturations today on 3 L/min were 93%.  We will reassess this on follow-up.  5.  Persistent atrial fibrillation: This issue adds complexity to his management.  He does not tolerate systemic steroids due to triggering rapid ventricular response.  We will see the patient in follow-up in 3 to 4 weeks time.  He is to contact us prior to that time should any new difficulties arise.  Total visit time was 45 minutes.  This chart was dictated using voice recognition software/Dragon.  Despite best efforts to proofread, errors can occur  which can change the meaning.  Any change was purely unintentional.

## 2019-10-11 ENCOUNTER — Other Ambulatory Visit: Payer: Self-pay

## 2019-10-11 ENCOUNTER — Encounter: Payer: Self-pay | Admitting: Pulmonary Disease

## 2019-10-11 ENCOUNTER — Ambulatory Visit (INDEPENDENT_AMBULATORY_CARE_PROVIDER_SITE_OTHER): Payer: Medicare Other

## 2019-10-11 ENCOUNTER — Other Ambulatory Visit
Admission: RE | Admit: 2019-10-11 | Discharge: 2019-10-11 | Disposition: A | Discharge status: Home / Self Care | Payer: Medicare Other | Source: Ambulatory Visit | Attending: Pulmonary Disease | Admitting: Pulmonary Disease

## 2019-10-11 DIAGNOSIS — J449 Chronic obstructive pulmonary disease, unspecified: Secondary | ICD-10-CM | POA: Insufficient documentation

## 2019-10-11 DIAGNOSIS — J4550 Severe persistent asthma, uncomplicated: Secondary | ICD-10-CM | POA: Diagnosis not present

## 2019-10-11 DIAGNOSIS — J181 Lobar pneumonia, unspecified organism: Secondary | ICD-10-CM | POA: Diagnosis present

## 2019-10-11 LAB — EXPECTORATED SPUTUM ASSESSMENT W GRAM STAIN, RFLX TO RESP C

## 2019-10-11 MED ORDER — OMALIZUMAB 150 MG/ML ~~LOC~~ SOSY
300.0000 mg | PREFILLED_SYRINGE | Freq: Once | SUBCUTANEOUS | Status: AC
  Administered 2019-10-11: 300 mg via SUBCUTANEOUS

## 2019-10-11 NOTE — Progress Notes (Signed)
Have you been hospitalized within the last 10 days?  No Do you have a fever?  No Do you have a cough?  No Do you have a headache or sore throat? No Do you have your Epi Pen visible and is it within date?  Yes 

## 2019-10-14 ENCOUNTER — Other Ambulatory Visit: Payer: Self-pay | Admitting: Internal Medicine

## 2019-10-14 LAB — CULTURE, RESPIRATORY W GRAM STAIN

## 2019-10-14 MED ORDER — DUAKLIR PRESSAIR 400-12 MCG/ACT IN AEPB: 1.0000 | INHALATION_SPRAY | Freq: Two times a day (BID) | RESPIRATORY_TRACT | 5 refills | Status: AC

## 2019-10-19 ENCOUNTER — Other Ambulatory Visit: Payer: Self-pay | Admitting: Pulmonary Disease

## 2019-10-19 MED ORDER — CIPROFLOXACIN HCL 500 MG PO TABS: 500.0000 mg | ORAL_TABLET | Freq: Two times a day (BID) | ORAL | 0 refills | Status: DC

## 2019-10-31 ENCOUNTER — Telehealth: Payer: Self-pay | Admitting: Pulmonary Disease

## 2019-10-31 NOTE — Telephone Encounter (Signed)
Xolair Prefilled Syringe Order: 150mg  Prefilled Syringe:  #2 75mg  Prefilled Syringe: N/A Ordered Date: 10/31/2019 Expected date of arrival: 11/02/2019 Ordered by: Desmond Dike, [place]  Specialty Pharmacy: [facility]

## 2019-11-02 NOTE — Telephone Encounter (Signed)
Xolair Prefilled Syringe Received:  150mg  Prefilled Syringe >> quantity #2, lot # [id number], exp date 05/2020 75mg  Prefilled Syringe >> N/A Medication arrival date: 11/02/2019 Received by: Desmond Dike, [place]

## 2019-11-04 ENCOUNTER — Other Ambulatory Visit: Payer: Self-pay

## 2019-11-04 ENCOUNTER — Encounter: Payer: Self-pay | Admitting: Pulmonary Disease

## 2019-11-04 ENCOUNTER — Ambulatory Visit: Payer: Medicare Other | Admitting: Pulmonary Disease

## 2019-11-04 VITALS — BP 134/76 | HR 86 | Temp 98.6°F | Ht 70.0 in | Wt 234.4 lb

## 2019-11-04 DIAGNOSIS — R059 Cough, unspecified: Secondary | ICD-10-CM

## 2019-11-04 DIAGNOSIS — J9819 Other pulmonary collapse: Secondary | ICD-10-CM

## 2019-11-04 DIAGNOSIS — J449 Chronic obstructive pulmonary disease, unspecified: Secondary | ICD-10-CM | POA: Diagnosis not present

## 2019-11-04 DIAGNOSIS — A498 Other bacterial infections of unspecified site: Secondary | ICD-10-CM

## 2019-11-04 DIAGNOSIS — J9611 Chronic respiratory failure with hypoxia: Secondary | ICD-10-CM

## 2019-11-04 DIAGNOSIS — R05 Cough: Secondary | ICD-10-CM | POA: Diagnosis not present

## 2019-11-04 DIAGNOSIS — I4819 Other persistent atrial fibrillation: Secondary | ICD-10-CM

## 2019-11-04 MED ORDER — DUAKLIR PRESSAIR 400-12 MCG/ACT IN AEPB: 1.0000 | INHALATION_SPRAY | Freq: Two times a day (BID) | RESPIRATORY_TRACT | 0 refills | Status: AC

## 2019-11-04 NOTE — Progress Notes (Signed)
Subjective:    Patient ID: ASENCION LOVEDAY, male    DOB: 22-Oct-1946, 73 y.o.   MRN: [id number]  HPI  Patient is a 73 year old remote former smoker (quit 1985) who has a history of asthma/COPD overlap syndrome (2.3 L or 70% predicted, 2019) on Xolair due to elevated IgE and poor control. He is also known to have right middle lobe syndrome with negative bronchoscopy for mass.  In the past has had high Aspergillus titers approximately 4 months ago and has been on voriconazole which he is now completing.  Most recent Aspergillus titer in September was low.  The patient is currently on Duaklir and QVAR Previously he followed with Dr. Ashby Dawes who is no longer in our practice.  I first saw him on 10 October 2019 please see that note for details.  He had an admission to [facility] on 21 September with pneumonia and severe sepsis.  Since that time he is requiring oxygen at 3 L/min.  He has not had difficulties with the oxygen.  He tries to stay active.  We switched him to the regimen noted above with Duaklir and QVAR and he feels that Alfredia Client is really helping him tremendously.  At his last visit we also collected sputum this showed Pseudomonas aeruginosa sensitive to Cipro.  He had a Cipro course called in.  He has finished that.  He has noted that his secretions have gotten better however he still has upper lung zone secretions that seem to pool and are difficult to expectorate.  He is using an Acapella flutter valve consistently.  Mucinex is only partially effective with the secretions.  Actually had to stop taking Mucinex at nighttime due to insomnia related issues with the medication and paradoxically, increased cough.  Sputum is white to occasionally "dark".  He has not had any fevers, chills or sweats.  No hemoptysis.  No chest pain.  No lower extremity edema.  He is compliant with his oxygen therapy.   Review of Systems A 10 point review of systems was performed and it is as noted  above, otherwise, negative.    Objective:   Physical Exam Vitals signs and nursing note reviewed.  Constitutional:      General: He is not in acute distress.    Appearance: Normal appearance. He is well-groomed.     Comments: Overweight  HENT:     Head: Normocephalic and atraumatic.     Right Ear: External ear normal.     Left Ear: External ear normal.     Nose:     Comments: Nose/mouth/throat not examined due to masking requirements for COVID 19. Eyes:     General: No scleral icterus.    Conjunctiva/sclera: Conjunctivae normal.     Pupils: Pupils are equal, round, and reactive to light.  Neck:     Musculoskeletal: Neck supple.     Thyroid: No thyromegaly.     Vascular: No JVD.     Trachea: Trachea and phonation normal.  Cardiovascular:     Rate and Rhythm: Normal rate. Rhythm irregular.     Pulses: Normal pulses.     Heart sounds: Normal heart sounds.  Pulmonary:     Effort: Pulmonary effort is normal. No respiratory distress.     Breath sounds: No wheezing, rhonchi or rales.     Comments: Coarse breath sounds Abdominal:     General: There is no distension.     Palpations: Abdomen is soft.  Musculoskeletal: Normal range of motion.  Right lower leg: No edema.     Left lower leg: No edema.  Lymphadenopathy:     Cervical: No cervical adenopathy.  Skin:    General: Skin is warm and dry.     Findings: No rash.  Neurological:     General: No focal deficit present.     Mental Status: He is alert and oriented to person, place, and time.  Psychiatric:        Mood and Affect: Mood normal.        Behavior: Behavior normal.        Assessment & Plan:   1.  Asthma-COPD overlap syndrome: Appears better compensated on Duaklir and QVAR, continue the same.  2.    Pseudomonas aeruginosa bronchitis: Continues to bring up significant amount of sputum, recently treated with Cipro.  Will reassess with sputum culture.  May need tobramycin nebulized.  3.  Right middle lobe  syndrome: This is a very common finding with regards to atelectasis.  Recall that the right middle lobe only constitutes 10% of the total lung volume.  This should have little impact on the rest of his respiratory issues.  During bronchoscopy on 23 June 2019 no mass was noted, mostly mucus plugging.  Management will be geared towards airway clearance.  Continue using Acapella flutter valve.    4.   Chronic respiratory failure with hypoxia: He is to continue oxygen as is for now.  Oxygen saturations today on 3 L/min were 93%.  He had desaturation with ambulation on room air  and titration confirms that he needs to continue using 3 L/min.    5.  Persistent atrial fibrillation: This issue adds complexity to his management.  He does not tolerate systemic steroids due to triggering rapid ventricular response.  We will see the patient in follow-up as scheduled in December.  He is to contact us prior to that time should any new difficulties arise.  This chart was dictated using voice recognition software/Dragon.  Despite best efforts to proofread, errors can occur which can change the meaning.  Any change was purely unintentional.    C. Derrill Kay, MD [facility]

## 2019-11-04 NOTE — Patient Instructions (Addendum)
1.  You need to continue oxygen at 3 L/min.  2.  Continue Duaklir and QVAR as they are currently.  3.  Recommend n-acetylcysteine (NAC) 600 mg twice a day.  This is a supplement that can be found in health food stores.  4.  We will see you in follow-up as scheduled in December.  Sooner should any new difficulties arise.  5.  We are collecting another sputum sample.  6.  Once you complete the voriconazole you will not need another prescription.

## 2019-11-08 ENCOUNTER — Other Ambulatory Visit
Admission: RE | Admit: 2019-11-08 | Discharge: 2019-11-08 | Disposition: A | Discharge status: Home / Self Care | Payer: Medicare Other | Source: Ambulatory Visit | Attending: Pulmonary Disease | Admitting: Pulmonary Disease

## 2019-11-08 DIAGNOSIS — R05 Cough: Secondary | ICD-10-CM | POA: Diagnosis present

## 2019-11-08 LAB — EXPECTORATED SPUTUM ASSESSMENT W GRAM STAIN, RFLX TO RESP C

## 2019-11-09 ENCOUNTER — Ambulatory Visit (INDEPENDENT_AMBULATORY_CARE_PROVIDER_SITE_OTHER): Payer: Medicare Other | Admitting: *Deleted

## 2019-11-09 ENCOUNTER — Other Ambulatory Visit: Payer: Self-pay

## 2019-11-09 ENCOUNTER — Ambulatory Visit (INDEPENDENT_AMBULATORY_CARE_PROVIDER_SITE_OTHER): Payer: Medicare Other

## 2019-11-09 ENCOUNTER — Other Ambulatory Visit: Payer: Self-pay | Admitting: Internal Medicine

## 2019-11-09 DIAGNOSIS — I4891 Unspecified atrial fibrillation: Secondary | ICD-10-CM

## 2019-11-09 DIAGNOSIS — I10 Essential (primary) hypertension: Secondary | ICD-10-CM

## 2019-11-09 DIAGNOSIS — J4550 Severe persistent asthma, uncomplicated: Secondary | ICD-10-CM

## 2019-11-09 DIAGNOSIS — I4510 Unspecified right bundle-branch block: Secondary | ICD-10-CM

## 2019-11-09 LAB — CUP PACEART REMOTE DEVICE CHECK
Battery Remaining Longevity: 80 mo
Battery Remaining Percentage: 73 %
Battery Voltage: 2.92 V
Brady Statistic AP VP Percent: 1 %
Brady Statistic AP VS Percent: 95 %
Brady Statistic AS VP Percent: 1 %
Brady Statistic AS VS Percent: 3.8 %
Brady Statistic RA Percent Paced: 96 %
Brady Statistic RV Percent Paced: 1.2 %
Date Time Interrogation Session: 20201109075100
Implantable Lead Implant Date: 20051106
Implantable Lead Implant Date: 20051106
Implantable Lead Location: 753859
Implantable Lead Location: 753860
Implantable Pulse Generator Implant Date: 20140115
Lead Channel Impedance Value: 360 Ohm
Lead Channel Impedance Value: 660 Ohm
Lead Channel Pacing Threshold Amplitude: 0.75 V
Lead Channel Pacing Threshold Amplitude: 1 V
Lead Channel Pacing Threshold Pulse Width: 0.4 ms
Lead Channel Pacing Threshold Pulse Width: 0.4 ms
Lead Channel Sensing Intrinsic Amplitude: 12 mV
Lead Channel Sensing Intrinsic Amplitude: 2.1 mV
Lead Channel Setting Pacing Amplitude: 2 V
Lead Channel Setting Pacing Amplitude: 2.5 V
Lead Channel Setting Pacing Pulse Width: 0.4 ms
Lead Channel Setting Sensing Sensitivity: 2 mV
Pulse Gen Model: 2210
Pulse Gen Serial Number: 7439597

## 2019-11-09 MED ORDER — OMALIZUMAB 150 MG/ML ~~LOC~~ SOSY
300.0000 mg | PREFILLED_SYRINGE | Freq: Once | SUBCUTANEOUS | Status: AC
  Administered 2019-11-09: 11:00:00 300 mg via SUBCUTANEOUS

## 2019-11-09 NOTE — Progress Notes (Signed)
All questions were answered by the patient before medication was administered. Have you been hospitalized in the last 10 days? No Do you have a fever? No Do you have a cough? No Do you have a headache or sore throat? No  

## 2019-11-10 LAB — FUNGAL ORGANISM REFLEX

## 2019-11-10 LAB — FUNGUS CULTURE RESULT

## 2019-11-10 LAB — FUNGUS CULTURE WITH STAIN

## 2019-11-11 ENCOUNTER — Telehealth: Payer: Self-pay | Admitting: Pulmonary Disease

## 2019-11-11 ENCOUNTER — Other Ambulatory Visit: Payer: Self-pay

## 2019-11-11 DIAGNOSIS — A498 Other bacterial infections of unspecified site: Secondary | ICD-10-CM

## 2019-11-11 LAB — CULTURE, RESPIRATORY W GRAM STAIN: Gram Stain: NONE SEEN

## 2019-11-11 MED ORDER — TOBRAMYCIN 300 MG/5ML IN NEBU: 300.0000 mg | INHALATION_SOLUTION | Freq: Two times a day (BID) | RESPIRATORY_TRACT | 2 refills | Status: AC

## 2019-11-11 MED ORDER — CIPROFLOXACIN HCL 500 MG PO TABS: 500.0000 mg | ORAL_TABLET | Freq: Two times a day (BID) | ORAL | 0 refills | Status: AC

## 2019-11-11 NOTE — Telephone Encounter (Signed)
Spoke to pt, who stated that [employer] does not have Tobi in stock currently, but has been ordered and will be here tomorrow.  Pt also had additional questions regarding nebulizer machine. Pt is aware that order has been placed to [employer] for this.  Pt voiced his understanding and had no further questions.  Nothing further is needed.

## 2019-11-15 ENCOUNTER — Other Ambulatory Visit: Payer: Self-pay

## 2019-11-15 ENCOUNTER — Telehealth: Payer: Self-pay | Admitting: Emergency Medicine

## 2019-11-15 DIAGNOSIS — J4550 Severe persistent asthma, uncomplicated: Secondary | ICD-10-CM

## 2019-11-15 DIAGNOSIS — J479 Bronchiectasis, uncomplicated: Secondary | ICD-10-CM

## 2019-11-15 NOTE — Telephone Encounter (Signed)
See phone note opened today.

## 2019-11-15 NOTE — Telephone Encounter (Signed)
Spoke with patient and he reports his Hr has remained in the 90's since his discharge from the hospital. He reports he has had no CP but has continued to have SOB with activity since  hospitalization for pneumonia in 08/2019. He reports BLE edema each evening and sleeps in bed that will allow him to elevate his legs during the night. Reports his RLE remains more edematous than the LLE in am. Reports he was taken off HCTZ, clonidine , Losarten, and Norvasc and those medications were discontinued when  discharged from the hospital.   Remote transmission sent that showed AP/VS rhythm and farfield r-waves noted.AT/AF burden 5.8%. No recorded episodes since October 05, 2019.  Recommended patient contact PCP to discuss symptoms and med changes. ED precautions given.

## 2019-11-16 MED ORDER — FUROSEMIDE 40 MG PO TABS: 40.0000 mg | ORAL_TABLET | Freq: Every day | ORAL | 3 refills | Status: DC

## 2019-11-22 ENCOUNTER — Telehealth: Payer: Self-pay | Admitting: Pulmonary Disease

## 2019-11-22 ENCOUNTER — Other Ambulatory Visit: Payer: Medicare Other

## 2019-11-22 ENCOUNTER — Ambulatory Visit (INDEPENDENT_AMBULATORY_CARE_PROVIDER_SITE_OTHER): Payer: Medicare Other

## 2019-11-22 ENCOUNTER — Other Ambulatory Visit: Payer: Self-pay

## 2019-11-22 VITALS — BP 126/82 | HR 76 | Ht 70.0 in | Wt 228.2 lb

## 2019-11-22 DIAGNOSIS — I5032 Chronic diastolic (congestive) heart failure: Secondary | ICD-10-CM

## 2019-11-22 DIAGNOSIS — I480 Paroxysmal atrial fibrillation: Secondary | ICD-10-CM | POA: Diagnosis not present

## 2019-11-22 DIAGNOSIS — I495 Sick sinus syndrome: Secondary | ICD-10-CM | POA: Diagnosis not present

## 2019-11-22 DIAGNOSIS — I5033 Acute on chronic diastolic (congestive) heart failure: Secondary | ICD-10-CM | POA: Insufficient documentation

## 2019-11-22 DIAGNOSIS — I10 Essential (primary) hypertension: Secondary | ICD-10-CM

## 2019-11-22 NOTE — Telephone Encounter (Signed)
Spoke to [place] and provided her with dx code of J44.9 Nothing further is needed.

## 2019-11-22 NOTE — Patient Instructions (Addendum)
Medication Instructions:  Your physician recommends that you continue on your current medications as directed. Please refer to the Current Medication list given to you today.  Continue lasix.  No further changes.  Labwork: None ordered.  Testing/Procedures: None ordered.  Follow-Up: Your physician wants you to follow-up per recall.  September 2021.  You will receive a letter in the mail when it is time to schedule your follow up.  Any Other Special Instructions Will Be Listed Below (If Applicable).  If you need a refill on your cardiac medications before your next appointment, please call your pharmacy.

## 2019-11-23 ENCOUNTER — Ambulatory Visit: Payer: Medicare Other

## 2019-11-23 LAB — BASIC METABOLIC PANEL
BUN/Creatinine Ratio: 19 (ref 10–24)
BUN: 16 mg/dL (ref 8–27)
CO2: 27 mmol/L (ref 20–29)
Calcium: 9.1 mg/dL (ref 8.6–10.2)
Chloride: 100 mmol/L (ref 96–106)
Creatinine, Ser: 0.86 mg/dL (ref 0.76–1.27)
GFR calc Af Amer: 99 mL/min/{1.73_m2} (ref >59)
GFR calc non Af Amer: 86 mL/min/{1.73_m2} (ref >59)
Glucose: 104 mg/dL — ABNORMAL HIGH (ref 65–99)
Potassium: 4.4 mmol/L (ref 3.5–5.2)
Sodium: 143 mmol/L (ref 134–144)

## 2019-11-28 ENCOUNTER — Ambulatory Visit (INDEPENDENT_AMBULATORY_CARE_PROVIDER_SITE_OTHER)
Admission: EM | Admit: 2019-11-28 | Discharge: 2019-11-28 | Disposition: A | Discharge status: Home / Self Care | Payer: Medicare Other | Source: Home / Self Care | Attending: Emergency Medicine | Admitting: Emergency Medicine

## 2019-11-28 ENCOUNTER — Telehealth: Payer: Self-pay

## 2019-11-28 ENCOUNTER — Other Ambulatory Visit: Payer: Self-pay

## 2019-11-28 ENCOUNTER — Emergency Department (HOSPITAL_COMMUNITY): Payer: Medicare Other

## 2019-11-28 ENCOUNTER — Encounter (HOSPITAL_COMMUNITY): Payer: Self-pay | Admitting: Emergency Medicine

## 2019-11-28 ENCOUNTER — Emergency Department (HOSPITAL_COMMUNITY)
Admission: EM | Admit: 2019-11-28 | Discharge: 2019-11-28 | Disposition: A | Discharge status: Home / Self Care | Payer: Medicare Other | Attending: Emergency Medicine | Admitting: Emergency Medicine

## 2019-11-28 DIAGNOSIS — Z7901 Long term (current) use of anticoagulants: Secondary | ICD-10-CM | POA: Diagnosis not present

## 2019-11-28 DIAGNOSIS — Z23 Encounter for immunization: Secondary | ICD-10-CM | POA: Diagnosis not present

## 2019-11-28 DIAGNOSIS — S0181XA Laceration without foreign body of other part of head, initial encounter: Secondary | ICD-10-CM

## 2019-11-28 DIAGNOSIS — Z7984 Long term (current) use of oral hypoglycemic drugs: Secondary | ICD-10-CM | POA: Diagnosis not present

## 2019-11-28 DIAGNOSIS — W01198D Fall on same level from slipping, tripping and stumbling with subsequent striking against other object, subsequent encounter: Secondary | ICD-10-CM | POA: Insufficient documentation

## 2019-11-28 DIAGNOSIS — R519 Headache, unspecified: Secondary | ICD-10-CM | POA: Insufficient documentation

## 2019-11-28 DIAGNOSIS — Z950 Presence of cardiac pacemaker: Secondary | ICD-10-CM | POA: Insufficient documentation

## 2019-11-28 DIAGNOSIS — I10 Essential (primary) hypertension: Secondary | ICD-10-CM | POA: Diagnosis not present

## 2019-11-28 DIAGNOSIS — W19XXXA Unspecified fall, initial encounter: Secondary | ICD-10-CM

## 2019-11-28 DIAGNOSIS — E119 Type 2 diabetes mellitus without complications: Secondary | ICD-10-CM | POA: Diagnosis not present

## 2019-11-28 DIAGNOSIS — Z87891 Personal history of nicotine dependence: Secondary | ICD-10-CM | POA: Insufficient documentation

## 2019-11-28 DIAGNOSIS — T07XXXA Unspecified multiple injuries, initial encounter: Secondary | ICD-10-CM | POA: Diagnosis not present

## 2019-11-28 DIAGNOSIS — J45909 Unspecified asthma, uncomplicated: Secondary | ICD-10-CM | POA: Diagnosis not present

## 2019-11-28 DIAGNOSIS — Z79899 Other long term (current) drug therapy: Secondary | ICD-10-CM | POA: Insufficient documentation

## 2019-11-28 DIAGNOSIS — S0181XD Laceration without foreign body of other part of head, subsequent encounter: Secondary | ICD-10-CM | POA: Diagnosis present

## 2019-11-28 DIAGNOSIS — W01198A Fall on same level from slipping, tripping and stumbling with subsequent striking against other object, initial encounter: Secondary | ICD-10-CM

## 2019-11-28 DIAGNOSIS — E039 Hypothyroidism, unspecified: Secondary | ICD-10-CM | POA: Insufficient documentation

## 2019-11-28 MED ORDER — MUPIROCIN 2 % EX OINT: 1.0000 "application " | TOPICAL_OINTMENT | Freq: Three times a day (TID) | CUTANEOUS | 0 refills | Status: DC

## 2019-11-28 MED ORDER — TETANUS-DIPHTH-ACELL PERTUSSIS 5-2.5-18.5 LF-MCG/0.5 IM SUSP
0.5000 mL | Freq: Once | INTRAMUSCULAR | Status: AC
  Administered 2019-11-28: 0.5 mL via INTRAMUSCULAR

## 2019-11-28 MED ORDER — CEPHALEXIN 500 MG PO CAPS: 1000.0000 mg | ORAL_CAPSULE | Freq: Two times a day (BID) | ORAL | 0 refills | Status: AC

## 2019-11-28 MED ORDER — HYDROCODONE-ACETAMINOPHEN 5-325 MG PO TABS
1.0000 | ORAL_TABLET | Freq: Once | ORAL | Status: AC
  Administered 2019-11-28: 17:00:00 1 via ORAL
  Filled 2019-11-28: qty 1

## 2019-11-28 NOTE — ED Provider Notes (Addendum)
HPI  SUBJECTIVE:  Clarence Dawson is a 73 y.o. male who presents with multiple abrasions, right forehead laceration sustained after tripping and falling on a piece of equipment, states that he hit his head on concrete.  He denies loss of consciousness, nausea, vomiting, headache.  He reports pain at the site.  No visual changes, neck pain.  No extremity weakness.  Denies syncope causing the fall.  He applied pressure to the laceration with hemostasis and came immediately here.  No other aggravating or alleviating factors.  He has a past medical history of atrial fibrillation on Xarelto, syncope, CHF, coronary disease, asthma, diabetes, hypertension, recent sepsis secondary to pneumonia.  Tetanus not up-to-date.  ZOX:WRUEA, Carloyn Manner, MD   Past Medical History:  Diagnosis Date  . Allergic rhinitis   . Aortic valve disorder   . Asthma    since childhood- seasonal allergies induced  . Cancer ([place])    Skin cancer- squamous, basal  . Carotid artery stenosis   . Essential hypertension   . Full dentures   . GERD (gastroesophageal reflux disease)   . H/O hiatal hernia   . Hemorrhage of rectum   . Hyperlipidemia   . Hypothyroidism   . 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 OSA (obstructive sleep apnea)   . Osteoarthritis   . Pacemaker    Oct 2005 in [place].  Marland Kitchen PAF (paroxysmal atrial fibrillation) ([place])   . Pneumonia    "several Times" 2015 last time  . Primary localized osteoarthritis of right knee 08/11/2017  . RBBB (right bundle branch block)   . Sinoatrial node dysfunction ([facility])   . Syncope   . Tricuspid valve disorder   . Type 2 diabetes mellitus ([place])    Type II    Past Surgical History:  Procedure Laterality Date  . A-V CARDIAC PACEMAKER INSERTION     Sick sinus syndrome DDR pacer  . Arthropathy Right 2005   Rebuilding of left thumb and joint   . CARDIAC CATHETERIZATION    . CARDIAC ELECTROPHYSIOLOGY STUDY AND ABLATION  09/2008   for pvcs, Dr. Loralie Champagne  . CARDIOVERSION N/A 12/18/2016   Procedure: CARDIOVERSION;  Surgeon: Pixie Casino, MD;  Location: [facility] ENDOSCOPY;  Service: Cardiovascular;  Laterality: N/A;  . [place]   right wrist  . CARPAL TUNNEL RELEASE  05/04/2012   Procedure: CARPAL TUNNEL RELEASE;  Surgeon: Wynonia Sours, MD;  Location: [place];  Service: Orthopedics;  Laterality: Left;  . CARPOMETACARPEL SUSPENSION PLASTY Right 11/16/2014   Procedure: SUSPENSIONPLASTY RIGHT THUMB TENDON TRANSFER ABDUCTOR POLLICUS LONGUS EXCISION TRAPEZIUM;  Surgeon: Daryll Brod, MD;  Location: [place];  Service: Orthopedics;  Laterality: Right;  . CHOLECYSTECTOMY  1994  . CIRCUMCISION    . COLONOSCOPY N/A 03/14/2013   Procedure: COLONOSCOPY;  Surgeon: Daneil Dolin, MD;  [facility];  Service: Endoscopy;  Laterality: N/A;  8:15 AM  . 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 EYE SURGERY  2012   Left eye Corneal transplant- partial- Cataract  . FLEXIBLE BRONCHOSCOPY Right 06/23/2019   Procedure: FLEXIBLE BRONCHOSCOPY RIGHT;  Surgeon: [name], MD;  Location: [facility] ORS;  Service: Pulmonary;  Laterality: Right;  . GALLBLADDER SURGERY  12/01/2006  . HAND TENDON SURGERY Left late 1990's   thumb  . HEMORROIDECTOMY  2003  . INJECTION KNEE Left 08/26/2017   Procedure: LEFT KNEE INJECTION;  Surgeon: Elsie Saas, MD;  Location: [place];  Service: Orthopedics;  Laterality: Left;  . left Knee Arthroscopy     [facility]  . PARTIAL KNEE ARTHROPLASTY  11/22/2012   Procedure: UNICOMPARTMENTAL KNEE;  Surgeon: Lorn Junes, MD;  Location: [place];  Service: Orthopedics;  Laterality: Left;  left unicompartmental knee arthroplasty  . PERMANENT PACEMAKER GENERATOR CHANGE N/A 01/12/2013   Procedure: PERMANENT PACEMAKER GENERATOR CHANGE;  Surgeon: Evans Lance, MD;  Location[facility] C[facility] LAB;  Service: Cardiovascular;  Laterality: N/A;  . Rotator cuff Surgery  2001   Right shoulder  .  TONSILLECTOMY    . TOTAL KNEE ARTHROPLASTY Right 08/26/2017   Procedure: TOTAL KNEE ARTHROPLASTY;  Surgeon: Elsie Saas, MD;  Location: [place];  Service: Orthopedics;  Laterality: Right;  . varicose vein reduction      Family History  Problem Relation Age of Onset  . Other Father 65       Sudden Cardiac death  . Pancreatic cancer Mother   . Colon cancer Mother   . Colon cancer Maternal Aunt   . Colon polyps Neg Hx     Social History   Tobacco Use  . Smoking status: Former Smoker    Packs/day: 1.00    Years: 25.00    Pack years: 25.00    Types: Cigarettes    Start date: 02/20/1958    Quit date: 02/12/1984    Years since quitting: 35.8  . Smokeless tobacco: Former Systems developer    Types: Chew  Substance Use Topics  . Alcohol use: Yes    Alcohol/week: 4.0 standard drinks    Types: 4 Glasses of wine per week  . Drug use: No    No current facility-administered medications for this encounter.   Current Outpatient Medications:  .  acetaminophen (TYLENOL) 500 MG tablet, Take 1,000 mg by mouth 3 (three) times daily., Disp: , Rfl:  .  albuterol (ACCUNEB) 0.63 MG/3ML nebulizer solution, Take 3 mLs by nebulization every 4 (four) hours as needed for shortness of breath., Disp: , Rfl:  .  albuterol (PROVENTIL HFA;VENTOLIN HFA) 108 (90 Base) MCG/ACT inhaler, Inhale 2 puffs every 4 (four) hours as needed into the lungs for shortness of breath (only if you can't catch your breath)., Disp: , Rfl:  .  beclomethasone (QVAR REDIHALER) 80 MCG/ACT inhaler, Inhale 2 puffs into the lungs 2 (two) times daily., Disp: 10.6 g, Rfl: 6 .  bisoprolol (ZEBETA) 5 MG tablet, TAKE 1 AND 1/2 TABLET BY MOUTH TWICE DAILY., Disp: 90 tablet, Rfl: 3 .  canagliflozin (INVOKANA) 300 MG TABS tablet, Take 300 mg by mouth daily before breakfast., Disp: , Rfl:  .  Carboxymethylcellul-Glycerin (LUBRICATING EYE DROPS OP), Place 1 drop into the right eye daily as needed (dry eyes)., Disp: , Rfl:  .  cephALEXin (KEFLEX) 500 MG  capsule, Take 2 capsules (1,000 mg total) by mouth 2 (two) times daily for 5 days., Disp: 20 capsule, Rfl: 0 .  diltiazem (CARDIZEM CD) 120 MG 24 hr capsule, Take 1 capsule (120 mg total) by mouth daily., Disp: 30 capsule, Rfl: 6 .  diphenhydramine-acetaminophen (TYLENOL PM) 25-500 MG TABS tablet, Take 1 tablet by mouth at bedtime., Disp: , Rfl:  .  EPINEPHrine 0.3 mg/0.3 mL IJ SOAJ injection, Inject 0.3 mg into the muscle as needed for anaphylaxis. , Disp: , Rfl:  .  flecainide (TAMBOCOR) 150 MG tablet, TAKE 1/2 TABLET BY MOUTH TWICE DAILY., Disp: 90 tablet, Rfl: 2 .  furosemide (LASIX) 40 MG tablet, Take 1 tablet (40 mg total) by mouth  daily., Disp: 90 tablet, Rfl: 3 .  glipiZIDE (GLUCOTROL XL) 5 MG 24 hr tablet, Take 5 mg by mouth daily before breakfast., Disp: , Rfl:  .  hydrocortisone cream 1 %, Apply 1 application topically daily as needed for itching., Disp: , Rfl:  .  levothyroxine (SYNTHROID, LEVOTHROID) 137 MCG tablet, Take 137 mcg by mouth daily before breakfast. , Disp: , Rfl:  .  metFORMIN (GLUCOPHAGE-XR) 500 MG 24 hr tablet, Take 1,000 mg by mouth at bedtime. , Disp: , Rfl:  .  montelukast (SINGULAIR) 10 MG tablet, TAKE (1) TABLET BY MOUTH AT BEDTIME., Disp: 30 tablet, Rfl: 0 .  Multiple Vitamin (MULTIVITAMIN) tablet, Take 1 tablet by mouth daily.  , Disp: , Rfl:  .  mupirocin ointment (BACTROBAN) 2 %, Apply 1 application topically 3 (three) times daily., Disp: 22 g, Rfl: 0 .  omeprazole (PRILOSEC OTC) 20 MG tablet, Take 20 mg by mouth daily. , Disp: , Rfl:  .  Respiratory Therapy Supplies (FLUTTER) DEVI, Use as directed, Disp: 1 each, Rfl: 0 .  rivaroxaban (XARELTO) 20 MG TABS tablet, Take 20 mg daily with supper by mouth., Disp: , Rfl:  .  tobramycin, PF, (TOBI) 300 MG/5ML nebulizer solution, Take 5 mLs (300 mg total) by nebulization every 12 (twelve) hours for 28 days. 28 day on, 28 days off, Disp: 280 mL, Rfl: 2 .  XOLAIR 150 MG/ML prefilled syringe, INJECT 300MG SUBCUTANEOUSLY  EVERY 4 WEEKS (GIVEN AT MD  OFFICE), Disp: 4 mL, Rfl: 5  Allergies  Allergen Reactions  . Food Anaphylaxis and Shortness Of Breath    TREE NUTS  . Iodinated Diagnostic Agents Hives and Shortness Of Breath    Patient states hives to throat closing. (01/15/17: patient states this reaction was "about 20 years ago" with possibly an IVP.  He now says high doses of prednisone "throw me into AFib."  He has tolerated CT arthrograms with Benadrly 23m PO one hour before injection.  JBrita Romp RN)  . Shellfish Allergy Anaphylaxis and Shortness Of Breath    To shellfish, crabs.  Makes him feel like "things are crawling all over" me.  Denies airway issues with these foods.  (Brita Romp RN 01/15/17)  . Goat-Derived PChild psychotherapist  . Prednisone Palpitations    PRECIPITATES A-FIB  . Metformin And Related Diarrhea    High doses at once  . Vancomycin Anxiety  . Voltaren [Diclofenac Sodium] Other (See Comments)    Feels like things are crawling on him     ROS  As noted in HPI.   Physical Exam  BP (!) 171/88   Pulse 75   Temp 97.9 F (36.6 C)   Resp (!) 22   SpO2 93%   Constitutional: Well developed, well nourished, no acute distress Eyes: PERRLA EOMI, conjunctiva normal bilaterally, no photophobia HENT: Normocephalic, 3 cm irregular laceration right forehead with large surrounding abrasion.  No palpable skull fracture.  No hemotympanum     Respiratory: Normal inspiratory effort Cardiovascular: Normal rate GI: nondistended skin: No rash, skin intact Musculoskeletal: no deformities Neurologic: Alert & oriented x 3, no focal neuro deficits. GCS 15 Psychiatric: Speech and behavior appropriate   ED Course   Medications - No data to display  No orders of the defined types were placed in this encounter.   No results found for this or any previous visit (from the past 24 hour(s)). No results found.  ED Clinical Impression  1. Fall, initial encounter  2.  Laceration of forehead, initial encounter   3. Multiple abrasions      ED Assessment/Plan   updating tetanus. Recent BUN/creatinine normal.    Will send home with Keflex 1000 g p.o. twice daily for 5 days and Bactroban for prophylaxis.  Procedure note.  Had staff irrigate and scrub wound out extensively with chlorhexidine/sterile water.  Used 1.5 cc of lidocaine 2% with epinephrine for local anesthesia via local infiltration with adequate anesthesia.  Placed six 5-0 interrupted Prolene sutures with close approximation of wound edges.  Patient tolerated procedure well.  Bactroban, dressing placed.  Feel that patient needs advanced imaging to rule out intracranial injury due to age and because he is on Xarelto.  Sending to [facility] ER.  Feel that he is stable to go by private vehicle.  Discussed medical decision-making, treatment plan, plan for follow-up with patient.  He will return here in 10 days for suture removal Or sooner for any signs of infection.  He will go to the ED immediately for head CT.  Meds ordered this encounter  Medications  . mupirocin ointment (BACTROBAN) 2 %    Sig: Apply 1 application topically 3 (three) times daily.    Dispense:  22 g    Refill:  0  . cephALEXin (KEFLEX) 500 MG capsule    Sig: Take 2 capsules (1,000 mg total) by mouth 2 (two) times daily for 5 days.    Dispense:  20 capsule    Refill:  0    *This clinic note was created using Lobbyist. Therefore, there may be occasional mistakes despite careful proofreading.   ?    Melynda Ripple, MD 11/29/19 0900    Melynda Ripple, MD 11/29/19 [phone]

## 2019-11-28 NOTE — Telephone Encounter (Signed)
Xolair Prefilled Syringe Order: 150mg  Prefilled Syringe:  #2 75mg  Prefilled Syringe: #0 Ordered Date: 11/28/19  Expected date of arrival: 12/01/2019 Ordered by: Len Blalock, [place]: Clarence Dawson

## 2019-11-28 NOTE — Discharge Instructions (Addendum)
Your head CT scan was normal without any bleed. Continue to monitor your symptoms and if you start to experience severe headaches, changes to your vision, or nausea return to the ER. Take your antibiotic as prescribed from Urgent care and keep wounds dry and clean. You may use ice on your forehead for the nest 24 hours then switch to head. Follow-up with your PCP within the next week if your symptoms do not improve. You may take over the counter Tylenol as needed for pain. Return to the ER for new or worsening symptoms.

## 2019-11-28 NOTE — ED Provider Notes (Signed)
[facility] EMERGENCY DEPARTMENT Provider Note   CSN: [id number] Arrival date & time: 11/28/19  1459     History   Chief Complaint Chief Complaint  Patient presents with  . Fall    HPI LECIL TAPP is a 73 y.o. male sensitive past medical history most significant for carotid artery stenosis, hypertension, GERD, hyperlipidemia, hypothyroidism pacemaker placed in 2005, right bundle branch block, sinoatrial node dysfunction, and diabetes who presents to the ED from urgent care after a mechanical fall that occurred this morning around 11 AM.  Patient notes he was working in the garage where he tripped and fell and hit his head on a tractor.  Patient was seen at urgent care prior to arrival and received 6 sutures in his forehead.  The provider at urgent care sent him here for a CT of his head because patient is on Xarelto.  Patient denies loss of consciousness.  Patient admits to a throbbing pain around the laceration. He has not taken anything for pain prior to arrival. Patient denies changes to vision, nausea, and vomiting. Patient was given an oral antibiotic and antibiotic ointment at Urgent Care. Tetanus shot was updated there as well. Patient denies shortness of breath, chest pain, neck pain, back pain, numbness/tingling, and weakness.  Past Medical History:  Diagnosis Date  . Allergic rhinitis   . Aortic valve disorder   . Asthma    since childhood- seasonal allergies induced  . Cancer ([place])    Skin cancer- squamous, basal  . Carotid artery stenosis   . Essential hypertension   . Full dentures   . GERD (gastroesophageal reflux disease)   . H/O hiatal hernia   . Hemorrhage of rectum   . Hyperlipidemia   . Hypothyroidism   . 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 OSA (obstructive sleep apnea)   . Osteoarthritis   . Pacemaker    Oct 2005 in [place].  Marland Kitchen PAF (paroxysmal atrial fibrillation) ([place])   . Pneumonia    "several Times" 2015 last time  . Primary localized osteoarthritis of right  knee 08/11/2017  . RBBB (right bundle branch block)   . Sinoatrial node dysfunction (HCC)   . Syncope   . Tricuspid valve disorder   . Type 2 diabetes mellitus ([place])    Type II    Patient Active Problem List   Diagnosis Date Noted  . Chronic diastolic heart failure ([place]) 11/22/2019  . Paroxysmal A-fib ([place]) 09/22/2019  . Atrial fibrillation with RVR ([place])   . Lobar pneumonia ([place]) 09/21/2019  . Acute respiratory failure with hypoxia ([place]) 09/21/2019  . Acute on chronic respiratory failure ([place]) 09/18/2019  . Severe sepsis ([place])   . Elevated lactic acid level   . GERD (gastroesophageal reflux disease)   . Chronic bronchitis ([place]) 02/09/2019  . CAP (community acquired pneumonia) 03/03/2018  . Primary localized osteoarthritis of right knee 08/11/2017  . Asthma with acute exacerbation 12/08/2016  . Chest pain 10/20/2016  . HTN (hypertension) 10/20/2016  . Morbid obesity due to excess calories ([place]) 06/24/2016  . Severe persistent asthma 02/12/2016  . Upper airway cough syndrome 01/24/2016  . 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 Persistent atrial fibrillation   . Syncope   . Hyperthyroidism   . Hyperlipidemia   . RBBB (right bundle branch block)   . Tricuspid valve disorder   . Aortic valve disorder   . Carotid artery stenosis   . Type 2 diabetes mellitus ([place])   . Pacemaker   . OSA (obstructive sleep  apnea)   . Arthritis   . Sinoatrial node dysfunction (HCC)   . PVC's (premature ventricular contractions) 07/17/2011  . Essential hypertension, benign 01/24/2011  . Premature ventricular contractions 01/24/2011  . PPM-[place] 01/24/2011    Past Surgical History:  Procedure Laterality Date  . A-V CARDIAC PACEMAKER INSERTION     Sick sinus syndrome DDR pacer  . Arthropathy Right 2005   Rebuilding of left thumb and joint   . CARDIAC CATHETERIZATION    . CARDIAC ELECTROPHYSIOLOGY STUDY AND ABLATION  09/2008   for pvcs, Dr. Loralie Champagne  . CARDIOVERSION N/A 12/18/2016   Procedure:  CARDIOVERSION;  Surgeon: Pixie Casino, MD;  Location: [facility] ENDOSCOPY;  Service: Cardiovascular;  Laterality: N/A;  . [place]   right wrist  . CARPAL TUNNEL RELEASE  05/04/2012   Procedure: CARPAL TUNNEL RELEASE;  Surgeon: Wynonia Sours, MD;  Location: [place];  Service: Orthopedics;  Laterality: Left;  . CARPOMETACARPEL SUSPENSION PLASTY Right 11/16/2014   Procedure: SUSPENSIONPLASTY RIGHT THUMB TENDON TRANSFER ABDUCTOR POLLICUS LONGUS EXCISION TRAPEZIUM;  Surgeon: Daryll Brod, MD;  Location: [place];  Service: Orthopedics;  Laterality: Right;  . CHOLECYSTECTOMY  1994  . CIRCUMCISION    . COLONOSCOPY N/A 03/14/2013   Procedure: COLONOSCOPY;  Surgeon: Daneil Dolin, MD;  [facility];  Service: Endoscopy;  Laterality: N/A;  8:15 AM  . 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 EYE SURGERY  2012   Left eye Corneal transplant- partial- Cataract  . FLEXIBLE BRONCHOSCOPY Right 06/23/2019   Procedure: FLEXIBLE BRONCHOSCOPY RIGHT;  Surgeon: [name], MD;  Location: [facility] ORS;  Service: Pulmonary;  Laterality: Right;  . GALLBLADDER SURGERY  12/01/2006  . HAND TENDON SURGERY Left late 1990's   thumb  . HEMORROIDECTOMY  2003  . INJECTION KNEE Left 08/26/2017   Procedure: LEFT KNEE INJECTION;  Surgeon: Elsie Saas, MD;  Location: [place];  Service: Orthopedics;  Laterality: Left;  . left Knee Arthroscopy     [facility]  . PARTIAL KNEE ARTHROPLASTY  11/22/2012   Procedure: UNICOMPARTMENTAL KNEE;  Surgeon: Lorn Junes, MD;  Location: [place];  Service: Orthopedics;  Laterality: Left;  left unicompartmental knee arthroplasty  . PERMANENT PACEMAKER GENERATOR CHANGE N/A 01/12/2013   Procedure: PERMANENT PACEMAKER GENERATOR CHANGE;  Surgeon: Evans Lance, MD;  Location: [facility] CATH LAB;  Service: Cardiovascular;  Laterality: N/A;  . Rotator cuff Surgery  2001   Right shoulder  .  TONSILLECTOMY    . TOTAL KNEE ARTHROPLASTY Right 08/26/2017   Procedure: TOTAL KNEE ARTHROPLASTY;  Surgeon: Elsie Saas, MD;  Location: [place];  Service: Orthopedics;  Laterality: Right;  . varicose vein reduction          Home Medications    Prior to Admission medications   Medication Sig Start Date End Date Taking? Authorizing Provider  acetaminophen (TYLENOL) 500 MG tablet Take 1,000 mg by mouth 3 (three) times daily.    [provider]  albuterol (ACCUNEB) 0.63 MG/3ML nebulizer solution Take 3 mLs by nebulization every 4 (four) hours as needed for shortness of breath. 02/16/19   [provider]  albuterol (PROVENTIL HFA;VENTOLIN HFA) 108 (90 Base) MCG/ACT inhaler Inhale 2 puffs every 4 (four) hours as needed into the lungs for shortness of breath (only if you can't catch your breath).    [provider]  beclomethasone (QVAR REDIHALER) 80 MCG/ACT inhaler Inhale 2 puffs into  the lungs 2 (two) times daily. 10/10/19 10/09/20  Tyler Pita, MD  bisoprolol (ZEBETA) 5 MG tablet TAKE 1 AND 1/2 TABLET BY MOUTH TWICE DAILY. 04/17/17   Sherran Needs, NP  canagliflozin (INVOKANA) 300 MG TABS tablet Take 300 mg by mouth daily before breakfast.    [provider]  Carboxymethylcellul-Glycerin (LUBRICATING EYE DROPS OP) Place 1 drop into the right eye daily as needed (dry eyes).    [provider]  cephALEXin (KEFLEX) 500 MG capsule Take 2 capsules (1,000 mg total) by mouth 2 (two) times daily for 5 days. 11/28/19 12/03/19  Melynda Ripple, MD  diltiazem (CARDIZEM CD) 120 MG 24 hr capsule Take 1 capsule (120 mg total) by mouth daily. 09/30/19   Fenton, Clint R, PA  diphenhydramine-acetaminophen (TYLENOL PM) 25-500 MG TABS tablet Take 1 tablet by mouth at bedtime.    [provider]  EPINEPHrine 0.3 mg/0.3 mL IJ SOAJ injection Inject 0.3 mg into the muscle as needed for anaphylaxis.  06/07/18   [provider]  flecainide (TAMBOCOR)  150 MG tablet TAKE 1/2 TABLET BY MOUTH TWICE DAILY. 11/10/19   Evans Lance, MD  furosemide (LASIX) 40 MG tablet Take 1 tablet (40 mg total) by mouth daily. 11/16/19 02/14/20  Evans Lance, MD  glipiZIDE (GLUCOTROL XL) 5 MG 24 hr tablet Take 5 mg by mouth daily before breakfast.    [provider]  hydrocortisone cream 1 % Apply 1 application topically daily as needed for itching.    [provider]  levothyroxine (SYNTHROID, LEVOTHROID) 137 MCG tablet Take 137 mcg by mouth daily before breakfast.  06/04/16   [provider]  metFORMIN (GLUCOPHAGE-XR) 500 MG 24 hr tablet Take 1,000 mg by mouth at bedtime.     [provider]  montelukast (SINGULAIR) 10 MG tablet TAKE (1) TABLET BY MOUTH AT BEDTIME. 10/25/18   [name], MD  Multiple Vitamin (MULTIVITAMIN) tablet Take 1 tablet by mouth daily.      [provider]  mupirocin ointment (BACTROBAN) 2 % Apply 1 application topically 3 (three) times daily. 11/28/19   Melynda Ripple, MD  omeprazole (PRILOSEC OTC) 20 MG tablet Take 20 mg by mouth daily.     [provider]  Respiratory Therapy Supplies (FLUTTER) DEVI Use as directed 10/10/19   Tyler Pita, MD  rivaroxaban (XARELTO) 20 MG TABS tablet Take 20 mg daily with supper by mouth.    [provider]  tobramycin, PF, (TOBI) 300 MG/5ML nebulizer solution Take 5 mLs (300 mg total) by nebulization every 12 (twelve) hours for 28 days. 28 day on, 28 days off 11/11/19 12/09/19  Tyler Pita, MD  Arvid Right 150 MG/ML prefilled syringe INJECT 300MG SUBCUTANEOUSLY EVERY 4 WEEKS (GIVEN AT MD  OFFICE) 05/16/19   [name], MD    Family History Family History  Problem Relation Age of Onset  . Other Father 34       Sudden Cardiac death  . Pancreatic cancer Mother   . Colon cancer Mother   . Colon cancer Maternal Aunt   . Colon polyps Neg Hx     Social History Social History   Tobacco Use  . Smoking  status: Former Smoker    Packs/day: 1.00    Years: 25.00    Pack years: 25.00    Types: Cigarettes    Start date: 02/20/1958    Quit date: 02/12/1984    Years since quitting: 35.8  . Smokeless tobacco: Former Systems developer  Types: Chew  Substance Use Topics  . Alcohol use: Yes    Alcohol/week: 4.0 standard drinks    Types: 4 Glasses of wine per week  . Drug use: No     Allergies   Food, Iodinated diagnostic agents, Shellfish allergy, Goat-derived products, Prednisone, Metformin and related, Vancomycin, and Voltaren [diclofenac sodium]   Review of Systems Review of Systems  Eyes: Negative for photophobia and visual disturbance.  Respiratory: Negative for shortness of breath.   Cardiovascular: Negative for chest pain.  Gastrointestinal: Negative for abdominal pain, nausea and vomiting.  Musculoskeletal: Positive for arthralgias. Negative for back pain, gait problem and neck pain.  Skin: Positive for color change and wound.  Neurological: Positive for headaches. Negative for dizziness, weakness and light-headedness.     Physical Exam Updated Vital Signs BP (!) 151/82 (BP Location: Right Arm)   Pulse 78   Temp (!) 97 F (36.1 C) (Oral)   Resp 16   Ht _0  (1.778 m)   Wt 99.8 kg   SpO2 98%   BMI 31.57 kg/m   Physical Exam Vitals signs and nursing note reviewed.  Constitutional:      General: He is not in acute distress.    Appearance: He is not ill-appearing.  HENT:     Head: Normocephalic.     Right Ear: Tympanic membrane normal.     Left Ear: Tympanic membrane normal.     Ears:     Comments: No hemotympanum    Nose: Nose normal.     Comments: No septal hematoma Eyes:     Extraocular Movements: Extraocular movements intact.     Pupils: Pupils are equal, round, and reactive to light.  Neck:     Musculoskeletal: Normal range of motion and neck supple. No muscular tenderness.     Comments: No cervical midline tenderness Cardiovascular:     Rate and Rhythm: Normal  rate and regular rhythm.     Pulses: Normal pulses.     Heart sounds: Normal heart sounds. No murmur. No friction rub. No gallop.   Pulmonary:     Effort: Pulmonary effort is normal.     Breath sounds: Normal breath sounds.  Abdominal:     General: Abdomen is flat. Bowel sounds are normal. There is no distension.     Palpations: Abdomen is soft.  Musculoskeletal:     Comments: No T-spine and L-spine midline tenderness, no stepoff or deformity No leg edema bilaterally Patient moves all extremities without difficulty. DP/PT pulses 2+ and equal bilaterally Sensation grossly intact bilaterally Strength of knee flexion and extension is 5/5 Plantar and dorsiflexion of ankle 5/5  Skin:    General: Skin is warm and dry.     Findings: Erythema and lesion present.     Comments: Laceration on right side of forehead with 6 sutures. 3 small abrasions to right pinky. See picture below  Neurological:     General: No focal deficit present.     Mental Status: He is alert.     Comments: Speech is clear, able to follow commands CN III-XII intact Normal strength in upper and lower extremities bilaterally including dorsiflexion and plantar flexion, strong and equal grip strength Sensation grossly intact throughout Moves extremities without ataxia, coordination intact No pronator drift Ambulates without difficulty           ED Treatments / Results  Labs (all labs ordered are listed, but only abnormal results are displayed) Labs Reviewed - No data to display  EKG None  Radiology Ct Head Wo Contrast  Result Date: 11/28/2019 CLINICAL DATA:  Head trauma, anticoagulant; head trauma, headache. EXAM: CT HEAD WITHOUT CONTRAST TECHNIQUE: Contiguous axial images were obtained from the base of the skull through the vertex without intravenous contrast. COMPARISON:  Head CT 09/18/2019 FINDINGS: Brain: No evidence of acute intracranial hemorrhage. No demarcated cortical infarction. No evidence of  intracranial mass. No midline shift or extra-axial fluid collection. Mild patchy hypoattenuation within the cerebral white matter is nonspecific, but consistent with chronic small vessel ischemic disease. Mild generalized parenchymal atrophy. Vascular: No hyperdense vessel.  Atherosclerotic calcifications Skull: Normal. Negative for fracture or focal lesion. Sinuses/Orbits: Bilateral lens replacements. Mild scattered paranasal sinus mucosal thickening. No significant mastoid effusion. Other: Right frontal scalp hematoma/laceration. IMPRESSION: 1. No evidence of acute intracranial abnormality. 2. Right frontal scalp hematoma/laceration. 3. Mild generalized parenchymal atrophy and chronic small vessel ischemic disease. Electronically Signed   By: Kellie Simmering DO   On: 11/28/2019 16:17    Procedures Procedures (including critical care time)  Medications Ordered in ED Medications  HYDROcodone-acetaminophen (NORCO/VICODIN) 5-325 MG per tablet 1 tablet (1 tablet Oral Given 11/28/19 1655)     Initial Impression / Assessment and Plan / ED Course  I have reviewed the triage vital signs and the nursing notes.  Pertinent labs & imaging results that were available during my care of the patient were reviewed by me and considered in my medical decision making (see chart for details).       73 year old male presents to the ED after a mechanical fall that occurred earlier today.  Patient was seen at urgent care prior to arrival where he had a laceration repair to his right forehead. [facility] shot updated at [facility]. Patient is afebrile, not tachycardic or hypoxic. Patient is in no acute distress and rather well appearing. Laceration with 6 sutures. 3 small abrasions on right pinky. Full neurological exam unremarkable. No septal hematoma, hemotympanum, or tenderness to palpation over orbit bones, low suspicion for facial fracture. Given patient is on Xarelto, will obtain CT head to rule out intracranial bleed.  CT  personally reviewed which is negative for acute abnormalities. Instructed patient to take antibiotics as directed from UC. I have advised patient to follow-up with his PCP if symptoms do not improve within the next week. Strict ED precautions discussed with patient. Patient states understanding and agrees to plan. Patient discharged home in no acute distress and stable vitals.  Discussed case with Dr. Kathrynn Humble who evaluated patient at bedside and agrees with assessment and plan.   Final Clinical Impressions(s) / ED Diagnoses   Final diagnoses:  Fall, initial encounter  Facial laceration, initial encounter    ED Discharge Orders    None       Jonette Eva, PA-C 11/29/19 0007    Varney Biles, MD 12/01/19 1000

## 2019-11-28 NOTE — ED Triage Notes (Signed)
Pt had trip and fall in garage around 11:00, pt has abrasions to hands and face and laceration to right eyebrow , pt denies LOC

## 2019-11-28 NOTE — Discharge Instructions (Addendum)
Return here in 10 days, sooner for any signs of infection.  Finish the antibiotics and use the Bactroban to help prevent an infection.

## 2019-11-28 NOTE — ED Triage Notes (Signed)
Pt states he tripped and fell today and hit head on tractor. Went to urgent care and got 6 sutures in forehead. Sent here for ct scan as pt is on xarelto.

## 2019-11-30 NOTE — Progress Notes (Signed)
Remote pacemaker transmission.   

## 2019-12-01 NOTE — Telephone Encounter (Signed)
Xolair Prefilled Syringe Received:  150mg  Prefilled Syringe >> quantity #2, lot # [id number], exp date 07/2020 75mg  Prefilled Syringe >>n/a Medication arrival date: 12/01/19  Received by: Parke Poisson CMA

## 2019-12-07 ENCOUNTER — Ambulatory Visit: Payer: Medicare Other

## 2019-12-07 ENCOUNTER — Ambulatory Visit
Admission: EM | Admit: 2019-12-07 | Discharge: 2019-12-07 | Disposition: A | Discharge status: Home / Self Care | Payer: Medicare Other

## 2019-12-07 ENCOUNTER — Telehealth: Payer: Self-pay | Admitting: Pulmonary Disease

## 2019-12-07 DIAGNOSIS — S0181XD Laceration without foreign body of other part of head, subsequent encounter: Secondary | ICD-10-CM

## 2019-12-07 DIAGNOSIS — Z4802 Encounter for removal of sutures: Secondary | ICD-10-CM | POA: Diagnosis not present

## 2019-12-07 NOTE — Telephone Encounter (Signed)
Called and spoke with Patient.  Patient requested to reschedule Xolair injection for Monday 12/12/19 at 0930.  Patient scheduled.  Nothing further at this time.

## 2019-12-07 NOTE — ED Triage Notes (Signed)
Pt presents to UC for suture removal which were placed 10 days ago. Pt has 6 sutures to be removed. Site is clean, edges well healed.

## 2019-12-07 NOTE — ED Notes (Signed)
Six sutures removed from right side of forehead. No bleeding after removal. Pt tolerated procedure well.

## 2019-12-12 ENCOUNTER — Other Ambulatory Visit: Payer: Self-pay

## 2019-12-12 ENCOUNTER — Ambulatory Visit (INDEPENDENT_AMBULATORY_CARE_PROVIDER_SITE_OTHER): Payer: Medicare Other

## 2019-12-12 DIAGNOSIS — J4550 Severe persistent asthma, uncomplicated: Secondary | ICD-10-CM

## 2019-12-12 MED ORDER — OMALIZUMAB 150 MG/ML ~~LOC~~ SOSY
300.0000 mg | PREFILLED_SYRINGE | Freq: Once | SUBCUTANEOUS | Status: AC
  Administered 2019-12-12: 300 mg via SUBCUTANEOUS

## 2019-12-12 NOTE — Progress Notes (Signed)
All questions were answered by the patient before medication was administered. Have you been hospitalized in the last 10 days? No Do you have a fever? No Do you have a cough? No Do you have a headache or sore throat? No  

## 2019-12-13 ENCOUNTER — Encounter: Payer: Self-pay | Admitting: Pulmonary Disease

## 2019-12-13 ENCOUNTER — Ambulatory Visit: Payer: Medicare Other | Admitting: Internal Medicine

## 2019-12-13 ENCOUNTER — Ambulatory Visit: Payer: Medicare Other | Admitting: Pulmonary Disease

## 2019-12-13 VITALS — BP 128/78 | HR 95 | Temp 97.9°F | Ht 70.0 in | Wt 227.2 lb

## 2019-12-13 DIAGNOSIS — J9611 Chronic respiratory failure with hypoxia: Secondary | ICD-10-CM | POA: Diagnosis not present

## 2019-12-13 DIAGNOSIS — A498 Other bacterial infections of unspecified site: Secondary | ICD-10-CM

## 2019-12-13 DIAGNOSIS — J479 Bronchiectasis, uncomplicated: Secondary | ICD-10-CM | POA: Diagnosis not present

## 2019-12-13 DIAGNOSIS — J449 Chronic obstructive pulmonary disease, unspecified: Secondary | ICD-10-CM

## 2019-12-13 DIAGNOSIS — I4819 Other persistent atrial fibrillation: Secondary | ICD-10-CM

## 2019-12-13 DIAGNOSIS — J9819 Other pulmonary collapse: Secondary | ICD-10-CM | POA: Diagnosis not present

## 2019-12-13 NOTE — Progress Notes (Signed)
Subjective:    Patient ID: Clarence Dawson, male    DOB: 12/16/1946, 73 y.o.   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 He is also known to have right middle lobe syndrome with negative bronchoscopy for mass. In the past has had high Aspergillus titers approximately 4 months ago and has been on voriconazole which he has now completed.  Most recent Aspergillus titer in September was low.  The patient is currently on Duaklir and QVARPreviously he followed with Dr. Ashby Dawes who is no longer in our practice.  I first saw him on 10 October 2019 and subsequently on 04 November 2019 please see those notes for details.  Patient has chronic hypoxic respiratory failure on oxygen at 3 L/min since admission at [facility] in September 2020.  He has had consistent issues with Pseudomonas growth in his sputum likely due to chronic colonization.  After his last visit he was started on tobramycin nebulized 28 days on and 28 days off since his last sputum of 10 November was still growing Pseudomonas.  He reports that he is sleeping better.  He has discontinued voriconazole which also has helped.  Serum quality has improved his mostly clear on the TOBI.  He is using N-acetylcysteine (NAC) for mucociliary clearance.  He feels that this helps him more than Mucinex.  He does note improvement overall in his breathing.  Wife notices that he is less fatigued.  No orthopnea paroxysmal nocturnal dyspnea no fevers, chills or sweats.  Review of Systems A 10 point review of systems was performed and it is as noted above otherwise negative.    Objective:   Physical Exam Vitals and nursing note reviewed.  Constitutional:      General: He is not in acute distress.    Appearance: Normal appearance. He is well-groomed.     Comments: Overweight  HENT:   Head: Normocephalic and atraumatic.     Right Ear: External ear normal.     Left Ear: External ear normal.     Nose:     Comments: Nose/mouth/throat not examined due to masking requirements for COVID 19. Eyes:     General: No scleral icterus.    Conjunctiva/sclera: Conjunctivae normal.     Pupils: Pupils are equal, round, and reactive to light.  Neck:     Thyroid: No thyromegaly.     Vascular: No JVD.     Trachea: Trachea and phonation normal.  Cardiovascular:     Rate and Rhythm: Normal rate. Rhythm irregular.     Pulses: Normal pulses.     Heart sounds: Normal heart sounds.  Pulmonary:     Effort: Pulmonary effort is normal. No respiratory distress.     Breath sounds: Wheezing (Mild) present. No rhonchi or rales.     Comments: Coarse breath sounds Abdominal:     General: There is no distension.     Palpations: Abdomen is soft.  Musculoskeletal:        General: Normal range of motion.     Cervical back: Neck supple.     Right lower leg: No edema.     Left lower leg: No edema.  Lymphadenopathy:     Cervical: No cervical adenopathy.  Skin:    General: Skin is warm and dry.     Findings: No rash.  Neurological:  General: No focal deficit present.     Mental Status: He is alert and oriented to person, place, and time.  Psychiatric:        Mood and Affect: Mood normal.        Behavior: Behavior normal.        Assessment & Plan:   1. Asthma-COPD overlap syndrome: Appears better compensated on Duaklir and QVAR, continue the same.  He has mild wheezing today but attributes this to having uses TOBI and secretions being loosened.  Overall he feels that he is breathing well.  He has been on Singulair of longstanding.  Not certain that this is really helping anything therefore will discontinue this.  2.  Bronchiectasis without complication: Currently no exacerbation.  Doing much better since he started tobramycin nebulized.  3.   Pseudomonas aeruginosa  bronchitis/colonization: He is on tobramycin nebulized twice a day 28 days on 28 days off.  He is 10 days into his first cycle.  He notes improvement in decrease amount of sputum and clearing of the sputum.  Continue the same.  4. Right middle lobe syndrome:This is a very common finding with regards to atelectasis. Recall that the right middle lobe only constitutes 10% of the total lung volume. This should have little impact on the rest of his respiratory issues. During bronchoscopy on 23 June 2019 no mass was noted, mostly mucus plugging. Management will be geared towards airway clearance.  Continue using Acapella flutter valve particularly after nebulization treatments.   5.  Chronic respiratory failure with hypoxia:He is to continue oxygen as is for now. Oxygen saturations today on 3 L/min pulsed (portable concentrator) were 93%.   He had oxygen titration during his last visit.  6. Persistent atrial fibrillation: This issue adds complexity to his management. He does not tolerate systemic steroids due to triggering rapid ventricular response.  He is on anticoagulation.   We will see the patient in follow-up in 3 months time.  He is to contact us prior to that time should any new difficulties arise.   Renold Don, MD [facility]   This note was dictated using voice recognition software/Dragon.  Despite best efforts to proofread, errors can occur which can change the meaning.  Any change was purely unintentional.

## 2019-12-13 NOTE — Patient Instructions (Signed)
1.  Continue tobramycin (TOBI) nebulized 28 days on 28 days off  2.  Discontinue Singulair  3.  We will see her in follow-up in 3 months time

## 2020-01-02 ENCOUNTER — Ambulatory Visit (HOSPITAL_COMMUNITY)
Admission: RE | Admit: 2020-01-02 | Discharge: 2020-01-02 | Disposition: A | Discharge status: Home / Self Care | Payer: Medicare Other | Source: Ambulatory Visit | Attending: Nurse Practitioner | Admitting: Nurse Practitioner

## 2020-01-02 ENCOUNTER — Other Ambulatory Visit: Payer: Self-pay

## 2020-01-02 VITALS — BP 130/62 | HR 77 | Ht 70.0 in | Wt 226.1 lb

## 2020-01-02 DIAGNOSIS — I4891 Unspecified atrial fibrillation: Secondary | ICD-10-CM | POA: Diagnosis present

## 2020-01-02 DIAGNOSIS — Z85828 Personal history of other malignant neoplasm of skin: Secondary | ICD-10-CM | POA: Insufficient documentation

## 2020-01-02 DIAGNOSIS — E039 Hypothyroidism, unspecified: Secondary | ICD-10-CM | POA: Diagnosis not present

## 2020-01-02 DIAGNOSIS — D6869 Other thrombophilia: Secondary | ICD-10-CM | POA: Diagnosis not present

## 2020-01-02 DIAGNOSIS — Z79890 Hormone replacement therapy (postmenopausal): Secondary | ICD-10-CM | POA: Insufficient documentation

## 2020-01-02 DIAGNOSIS — Z7901 Long term (current) use of anticoagulants: Secondary | ICD-10-CM | POA: Diagnosis not present

## 2020-01-02 DIAGNOSIS — Z7951 Long term (current) use of inhaled steroids: Secondary | ICD-10-CM | POA: Insufficient documentation

## 2020-01-02 DIAGNOSIS — Z888 Allergy status to other drugs, medicaments and biological substances status: Secondary | ICD-10-CM | POA: Insufficient documentation

## 2020-01-02 DIAGNOSIS — E785 Hyperlipidemia, unspecified: Secondary | ICD-10-CM | POA: Diagnosis not present

## 2020-01-02 DIAGNOSIS — I495 Sick sinus syndrome: Secondary | ICD-10-CM | POA: Insufficient documentation

## 2020-01-02 DIAGNOSIS — I480 Paroxysmal atrial fibrillation: Secondary | ICD-10-CM

## 2020-01-02 DIAGNOSIS — I4510 Unspecified right bundle-branch block: Secondary | ICD-10-CM | POA: Insufficient documentation

## 2020-01-02 DIAGNOSIS — Z881 Allergy status to other antibiotic agents status: Secondary | ICD-10-CM | POA: Diagnosis not present

## 2020-01-02 DIAGNOSIS — Z91041 Radiographic dye allergy status: Secondary | ICD-10-CM | POA: Diagnosis not present

## 2020-01-02 DIAGNOSIS — I5032 Chronic diastolic (congestive) heart failure: Secondary | ICD-10-CM | POA: Insufficient documentation

## 2020-01-02 DIAGNOSIS — Z91018 Allergy to other foods: Secondary | ICD-10-CM | POA: Diagnosis not present

## 2020-01-02 DIAGNOSIS — Z91013 Allergy to seafood: Secondary | ICD-10-CM | POA: Insufficient documentation

## 2020-01-02 DIAGNOSIS — Z96651 Presence of right artificial knee joint: Secondary | ICD-10-CM | POA: Diagnosis not present

## 2020-01-02 DIAGNOSIS — Z87891 Personal history of nicotine dependence: Secondary | ICD-10-CM | POA: Insufficient documentation

## 2020-01-02 DIAGNOSIS — G4733 Obstructive sleep apnea (adult) (pediatric): Secondary | ICD-10-CM | POA: Insufficient documentation

## 2020-01-02 DIAGNOSIS — Z7984 Long term (current) use of oral hypoglycemic drugs: Secondary | ICD-10-CM | POA: Insufficient documentation

## 2020-01-02 DIAGNOSIS — E119 Type 2 diabetes mellitus without complications: Secondary | ICD-10-CM | POA: Insufficient documentation

## 2020-01-02 DIAGNOSIS — K219 Gastro-esophageal reflux disease without esophagitis: Secondary | ICD-10-CM | POA: Insufficient documentation

## 2020-01-02 DIAGNOSIS — Z950 Presence of cardiac pacemaker: Secondary | ICD-10-CM | POA: Insufficient documentation

## 2020-01-02 DIAGNOSIS — E669 Obesity, unspecified: Secondary | ICD-10-CM | POA: Insufficient documentation

## 2020-01-02 DIAGNOSIS — J449 Chronic obstructive pulmonary disease, unspecified: Secondary | ICD-10-CM | POA: Insufficient documentation

## 2020-01-02 DIAGNOSIS — Z6832 Body mass index (BMI) 32.0-32.9, adult: Secondary | ICD-10-CM | POA: Diagnosis not present

## 2020-01-02 DIAGNOSIS — M1990 Unspecified osteoarthritis, unspecified site: Secondary | ICD-10-CM | POA: Insufficient documentation

## 2020-01-02 DIAGNOSIS — Z79899 Other long term (current) drug therapy: Secondary | ICD-10-CM | POA: Diagnosis not present

## 2020-01-02 DIAGNOSIS — Z800 Family history of malignant neoplasm of digestive organs: Secondary | ICD-10-CM | POA: Insufficient documentation

## 2020-01-02 DIAGNOSIS — I110 Hypertensive heart disease with heart failure: Secondary | ICD-10-CM | POA: Insufficient documentation

## 2020-01-02 NOTE — Telephone Encounter (Signed)
LG, please advise. Thanks

## 2020-01-02 NOTE — Progress Notes (Signed)
Primary Care Physician: [name], MD Primary Electrophysiologist: Dr Lovena Le Referring Physician: Dr Carlos Levering is a 74 y.o. male with a history of SSS (s/p PPM placement with gen change in 2014), paroxysmal atrial fibrillation, chronic diastolic CHF, HTN, COPD, DM, and GERD who presents for follow up in the [facility]. Patient was recently admitted with elevated temp and AMS and was diagnosed with severe sepsis 2/2 pneumonia. During his admission, he developed afib with RVR. He was given a bolus of flecainide which converted him to atrial flutter. Patient was fairly asymptomatic. He is on Xarelto for a CHADS2VASC score of 3.   On follow up today, patient reports he has done very well since his last visit. He denies any heart racing or palpitations. No new afib episodes on last device interrogation. He is tolerating the medication without difficulty.   Today, he denies symptoms of palpitations, chest pain, shortness of breath, orthopnea, PND, lower extremity edema, dizziness, presyncope, syncope, snoring, daytime somnolence, bleeding, or neurologic sequela. The patient is tolerating medications without difficulties and is otherwise without complaint today.    Atrial Fibrillation Risk Factors:  he does not have symptoms or diagnosis of sleep apnea. he does not have a history of rheumatic fever. he does not have a history of alcohol use. The patient does not have a history of early familial atrial fibrillation or other arrhythmias.  he has a BMI of Body mass index is 32.45 kg/m.Marland Kitchen Filed Weights   01/02/20 1021  Weight: 102.6 kg    Family History  Problem Relation Age of Onset  . Other Father 63       Sudden Cardiac death  . Pancreatic cancer Mother   . Colon cancer Mother   . Colon cancer Maternal Aunt   . Colon polyps Neg Hx      Atrial Fibrillation Management history:  Previous antiarrhythmic drugs: flecainide Previous  cardioversions: 11/2016 Previous ablations: none CHADS2VASC score: 3 Anticoagulation history: Xarelto   Past Medical History:  Diagnosis Date  . Allergic rhinitis   . Aortic valve disorder   . Asthma    since childhood- seasonal allergies induced  . Cancer ([place])    Skin cancer- squamous, basal  . Carotid artery stenosis   . Essential hypertension   . Full dentures   . GERD (gastroesophageal reflux disease)   . H/O hiatal hernia   . Hemorrhage of rectum   . Hyperlipidemia   . Hypothyroidism   . 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 OSA (obstructive sleep apnea)   . Osteoarthritis   . Pacemaker    Oct 2005 in [place].  Marland Kitchen PAF (paroxysmal atrial fibrillation) ([place])   . Pneumonia    "several Times" 2015 last time  . Primary localized osteoarthritis of right knee 08/11/2017  . RBBB (right bundle branch block)   . Sinoatrial node dysfunction (HCC)   . Syncope   . Tricuspid valve disorder   . Type 2 diabetes mellitus ([place])    Type II   Past Surgical History:  Procedure Laterality Date  . A-V CARDIAC PACEMAKER INSERTION     Sick sinus syndrome DDR pacer  . Arthropathy Right 2005   Rebuilding of left thumb and joint   . CARDIAC CATHETERIZATION    . CARDIAC ELECTROPHYSIOLOGY STUDY AND ABLATION  09/2008   for pvcs, Dr. Loralie Champagne  . CARDIOVERSION N/A 12/18/2016   Procedure: CARDIOVERSION;  Surgeon: Pixie Casino, MD;  Location: [place];  Service: Cardiovascular;  Laterality: N/A;  . [place]   right wrist  . CARPAL TUNNEL RELEASE  05/04/2012   Procedure: CARPAL TUNNEL RELEASE;  Surgeon: Wynonia Sours, MD;  Location: [place];  Service: Orthopedics;  Laterality: Left;  . CARPOMETACARPEL SUSPENSION PLASTY Right 11/16/2014   Procedure: SUSPENSIONPLASTY RIGHT THUMB TENDON TRANSFER ABDUCTOR POLLICUS LONGUS EXCISION TRAPEZIUM;  Surgeon: Daryll Brod, MD;  Location: [place];  Service: Orthopedics;  Laterality: Right;  . CHOLECYSTECTOMY  1994   . CIRCUMCISION    . COLONOSCOPY N/A 03/14/2013   Procedure: COLONOSCOPY;  Surgeon: Daneil Dolin, MD;  [facility];  Service: Endoscopy;  Laterality: N/A;  8:15 AM  . 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 EYE SURGERY  2012   Left eye Corneal transplant- partial- Cataract  . FLEXIBLE BRONCHOSCOPY Right 06/23/2019   Procedure: FLEXIBLE BRONCHOSCOPY RIGHT;  Surgeon: [name], MD;  Location: [facility];  Service: Pulmonary;  Laterality: Right;  . GALLBLADDER SURGERY  12/01/2006  . HAND TENDON SURGERY Left late 1990's   thumb  . HEMORROIDECTOMY  2003  . INJECTION KNEE Left 08/26/2017   Procedure: LEFT KNEE INJECTION;  Surgeon: Elsie Saas, MD;  Location: [place];  Service: Orthopedics;  Laterality: Left;  . left Knee Arthroscopy     [facility]  . PARTIAL KNEE ARTHROPLASTY  11/22/2012   Procedure: UNICOMPARTMENTAL KNEE;  Surgeon: Lorn Junes, MD;  Location: [place];  Service: Orthopedics;  Laterality: Left;  left unicompartmental knee arthroplasty  . PERMANENT PACEMAKER GENERATOR CHANGE N/A 01/12/2013   Procedure: PERMANENT PACEMAKER GENERATOR CHANGE;  Surgeon: Evans Lance, MD;  Location: [facility];  Service: Cardiovascular;  Laterality: N/A;  . Rotator cuff Surgery  2001   Right shoulder  . TONSILLECTOMY    . TOTAL KNEE ARTHROPLASTY Right 08/26/2017   Procedure: TOTAL KNEE ARTHROPLASTY;  Surgeon: Elsie Saas, MD;  Location: [place];  Service: Orthopedics;  Laterality: Right;  . varicose vein reduction      Current Outpatient Medications  Medication Sig Dispense Refill  . albuterol (PROVENTIL HFA;VENTOLIN HFA) 108 (90 Base) MCG/ACT inhaler Inhale 2 puffs every 4 (four) hours as needed into the lungs for shortness of breath (only if you can't catch your breath).    . beclomethasone (QVAR REDIHALER) 80 MCG/ACT inhaler Inhale 2 puffs into the lungs 2 (two) times daily. 10.6 g 6  . bisoprolol (ZEBETA) 5 MG tablet TAKE 1  AND 1/2 TABLET BY MOUTH TWICE DAILY. 90 tablet 3  . canagliflozin (INVOKANA) 300 MG TABS tablet Take 300 mg by mouth daily before breakfast.    . Carboxymethylcellul-Glycerin (LUBRICATING EYE DROPS OP) Place 1 drop into the right eye daily as needed (dry eyes).    Marland Kitchen diltiazem (CARDIZEM CD) 120 MG 24 hr capsule Take 1 capsule (120 mg total) by mouth daily. 30 capsule 6  . diphenhydramine-acetaminophen (TYLENOL PM) 25-500 MG TABS tablet Take 1 tablet by mouth at bedtime.    Marland Kitchen EPINEPHrine 0.3 mg/0.3 mL IJ SOAJ injection Inject 0.3 mg into the muscle as needed for anaphylaxis.     . flecainide (TAMBOCOR) 150 MG tablet TAKE 1/2 TABLET BY MOUTH TWICE DAILY. 90 tablet 2  . furosemide (LASIX) 40 MG tablet Take 1 tablet (40 mg total) by mouth daily. 90 tablet 3  . glipiZIDE (GLUCOTROL XL) 5 MG 24 hr tablet Take 5 mg by mouth daily before breakfast.    .  hydrocortisone cream 1 % Apply 1 application topically daily as needed for itching.    . levothyroxine (SYNTHROID, LEVOTHROID) 137 MCG tablet Take 137 mcg by mouth daily before breakfast.     . metFORMIN (GLUCOPHAGE-XR) 500 MG 24 hr tablet Take 1,000 mg by mouth at bedtime.     . Multiple Vitamin (MULTIVITAMIN) tablet Take 1 tablet by mouth daily.      Marland Kitchen omeprazole (PRILOSEC OTC) 20 MG tablet Take 20 mg by mouth daily.     Marland Kitchen Respiratory Therapy Supplies (FLUTTER) DEVI Use as directed 1 each 0  . rivaroxaban (XARELTO) 20 MG TABS tablet Take 20 mg daily with supper by mouth.    . tobramycin, PF, (TOBI) 300 MG/5ML nebulizer solution Take 300 mg by nebulization.    Arvid Right 150 MG/ML prefilled syringe INJECT 300MG SUBCUTANEOUSLY EVERY 4 WEEKS (GIVEN AT MD  OFFICE) 4 mL 5  . acetaminophen (TYLENOL) 500 MG tablet Take 1,000 mg by mouth 3 (three) times daily.    Marland Kitchen albuterol (ACCUNEB) 0.63 MG/3ML nebulizer solution Take 3 mLs by nebulization every 4 (four) hours as needed for shortness of breath.     No current facility-administered medications for this  encounter.    Allergies  Allergen Reactions  . Food Anaphylaxis and Shortness Of Breath    TREE NUTS  . Iodinated Diagnostic Agents Hives and Shortness Of Breath    Patient states hives to throat closing. (01/15/17: patient states this reaction was "about 20 years ago" with possibly an IVP.  He now says high doses of prednisone "throw me into AFib."  He has tolerated CT arthrograms with Benadrly 34m PO one hour before injection.  JBrita Romp RN)  . Shellfish Allergy Anaphylaxis and Shortness Of Breath    To shellfish, crabs.  Makes him feel like "things are crawling all over" me.  Denies airway issues with these foods.  (Brita Romp RN 01/15/17)  . Goat-Derived PChild psychotherapist  . Prednisone Palpitations    PRECIPITATES A-FIB  . Metformin And Related Diarrhea    High doses at once  . Vancomycin Anxiety  . Voltaren [Diclofenac Sodium] Other (See Comments)    Feels like things are crawling on him    Social History   Socioeconomic History  . Marital status: Married    Spouse name: Not on file  . Number of children: Not on file  . Years of education: Not on file  . Highest education level: Not on file  Occupational History  . Not on file  Tobacco Use  . Smoking status: Former Smoker    Packs/day: 1.00    Years: 25.00    Pack years: 25.00    Types: Cigarettes    Start date: 02/20/1958    Quit date: 02/12/1984    Years since quitting: 35.9  . Smokeless tobacco: Former [employer] developer   Types: Chew  Substance and Sexual Activity  . Alcohol use: Yes    Alcohol/week: 4.0 standard drinks    Types: 4 Glasses of wine per week  . Drug use: No  . Sexual activity: Not on file  Other Topics Concern  . Not on file  Social History Narrative   Regular exercise: No   Social Determinants of Health   Financial Resource Strain:   . Difficulty of Paying Living Expenses: Not on file  Food Insecurity:   . Worried About RCharity fundraiserin the Last Year: Not on file  . Ran Out  of Food in the Last Year: Not on file  Transportation Needs:   . Lack of Transportation (Medical): Not on file  . Lack of Transportation (Non-Medical): Not on file  Physical Activity:   . Days of Exercise per Week: Not on file  . Minutes of Exercise per Session: Not on file  Stress:   . Feeling of Stress : Not on file  Social Connections:   . Frequency of Communication with Friends and Family: Not on file  . Frequency of Social Gatherings with Friends and Family: Not on file  . Attends Religious Services: Not on file  . Active Member of Clubs or Organizations: Not on file  . Attends Archivist Meetings: Not on file  . Marital Status: Not on file  Intimate Partner Violence:   . Fear of Current or Ex-Partner: Not on file  . Emotionally Abused: Not on file  . Physically Abused: Not on file  . Sexually Abused: Not on file     ROS- All systems are reviewed and negative except as per the HPI above.  Physical Exam: Vitals:   01/02/20 1021  BP: 130/62  Pulse: 77  Weight: 102.6 kg  Height: _0  (1.778 m)    GEN- The patient is well appearing obese male, alert and oriented x 3 today.   HEENT-head normocephalic, atraumatic, sclera clear, conjunctiva pink, hearing intact, trachea midline. Lungs- Clear to ausculation bilaterally, normal work of breathing Heart- Regular rate and rhythm, no murmurs, rubs or gallops  GI- soft, NT, ND, + BS Extremities- no clubbing, cyanosis, or edema MS- no significant deformity or atrophy Skin- no rash or lesion Psych- euthymic mood, full affect Neuro- strength and sensation are intact   Wt Readings from Last 3 Encounters:  01/02/20 102.6 kg  12/13/19 103.1 kg  11/28/19 99.8 kg    EKG today demonstrates A paced rhythm HR 77, RBBB, PR 250, QRS 170, QTc 504  Echo 10/21/16 demonstrated  - Left ventricle: The cavity size was normal. Wall thickness was   increased in a pattern of mild LVH. Systolic function was normal.   The  estimated ejection fraction was in the range of 60% to 65%.   Wall motion was normal; there were no regional wall motion   abnormalities. Features are consistent with a pseudonormal left   ventricular filling pattern, with concomitant abnormal relaxation   and increased filling pressure (grade 2 diastolic dysfunction). - Aortic valve: Mildly to moderately calcified annulus. Moderately   calcified leaflets. There was mild to moderate stenosis. Valve   area (VTI): 1.3 cm^2. - Mitral valve: Calcified annulus. There was trivial regurgitation. - Left atrium: The atrium was mildly to moderately dilated. - Right ventricle: Pacer wire or catheter noted in right ventricle. - Right atrium: Central venous pressure (est): 3 mm Hg. - Tricuspid valve: There was trivial regurgitation. - Pulmonary arteries: PA peak pressure: 48 mm Hg (S). - Pericardium, extracardiac: A prominent pericardial fat pad was   present.  Impressions:  - Mild LVH with LVEF 60-65%. Grade 2 diastolic dysfunction. Mild to   moderate left atrial enlargement. Mildly calcified mitral annulus   with trivial mitral regurgitation. Moderately calcified aortic   valve, leaflet structure difficult to identify. There is evidence   of mild to moderate aortic stenosis. Device wire present in the   right heart. Trivial tricuspid regurgitation with PASP 48 mmHg.  Epic records are reviewed at length today  Assessment and Plan:  1. Paroxysmal atrial fibrillation Patient appears to  be maintaining SR. Continue flecainide 75 mg BID Continue diltiazem 120 mg daily Continue bisoprolol 7.5 mg BID Continue Xarelto 20 mg daily Lifestyle modification as below.   This patients CHA2DS2-VASc Score and unadjusted Ischemic Stroke Rate (% per year) is equal to 3.2 % stroke rate/year from a score of 3  Above score calculated as 1 point each if present [CHF, HTN, DM, Vascular=MI/PAD/Aortic Plaque, Age if 65-74, or Male] Above score calculated as 2  points each if present [Age > 75, or Stroke/TIA/TE]   2. Obesity Body mass index is 32.45 kg/m. Lifestyle modification was discussed and encouraged including regular physical activity and weight reduction.  3. SSS S/p PPM, followed by Dr Lovena Le and the device clinic.  4. HTN Stable, no changes today.   Follow up with Dr Lovena Le per recall. AF clinic as needed.    [facility] [address] [place], [place] [address] [phone] 01/02/2020 1:20 PM

## 2020-01-03 ENCOUNTER — Telehealth: Payer: Self-pay | Admitting: Pulmonary Disease

## 2020-01-03 NOTE — Telephone Encounter (Addendum)
Xolair Prefilled Syringe Order: 150mg  Prefilled Syringe:  #2 75mg  Prefilled Syringe: N/A Ordered Date: 01/03/2020 Expected date of arrival: 01/06/2020 Ordered by: Desmond Dike, [place]  Specialty Pharmacy: [facility]   While on the phone with [facility], was advised that the pt's PA has expired. A new PA will be needed. PA has been started on Cover My Meds. Key: [id number]. Will await PA decision.

## 2020-01-03 NOTE — Telephone Encounter (Signed)
Checked Cover My Meds. PA has been approved through 01/02/2021.

## 2020-01-09 ENCOUNTER — Ambulatory Visit: Payer: Medicare Other

## 2020-01-09 LAB — CUP PACEART INCLINIC DEVICE CHECK
Date Time Interrogation Session: 20200827163853
Implantable Lead Implant Date: 20051106
Implantable Lead Implant Date: 20051106
Implantable Lead Location: 753859
Implantable Lead Location: 753860
Implantable Pulse Generator Implant Date: 20140115
Pulse Gen Model: 2210
Pulse Gen Serial Number: 7439597

## 2020-01-09 NOTE — Telephone Encounter (Signed)
Called [facility].  Office was closed 01/06/20 due to weather.  Scheduled shipment not received.  Xolair shipment scheduled for 01/10/2020.

## 2020-01-10 NOTE — Telephone Encounter (Signed)
Xolair Prefilled Syringe Received:  150mg  Prefilled Syringe >> quantity #2, lot # [id number], exp date 08/29/2020 75mg  Prefilled Syringe >> N/A Medication arrival date: 01/10/20 Received by: Vuk Skillern,LPN

## 2020-01-11 ENCOUNTER — Other Ambulatory Visit: Payer: Self-pay

## 2020-01-11 ENCOUNTER — Ambulatory Visit (INDEPENDENT_AMBULATORY_CARE_PROVIDER_SITE_OTHER): Payer: Medicare Other

## 2020-01-11 DIAGNOSIS — J4550 Severe persistent asthma, uncomplicated: Secondary | ICD-10-CM | POA: Diagnosis not present

## 2020-01-11 DIAGNOSIS — J449 Chronic obstructive pulmonary disease, unspecified: Secondary | ICD-10-CM | POA: Diagnosis not present

## 2020-01-11 MED ORDER — OMALIZUMAB 150 MG/ML ~~LOC~~ SOSY
300.0000 mg | PREFILLED_SYRINGE | Freq: Once | SUBCUTANEOUS | Status: AC
  Administered 2020-01-11: 300 mg via SUBCUTANEOUS

## 2020-01-11 NOTE — Progress Notes (Signed)
Have you been hospitalized within the last 10 days?  No Do you have a fever?  No Do you have a cough?  No Do you have a headache or sore throat? No  

## 2020-01-30 ENCOUNTER — Telehealth: Payer: Self-pay | Admitting: Pulmonary Disease

## 2020-01-30 NOTE — Telephone Encounter (Signed)
Xolair Prefilled Syringe Order: 150mg  Prefilled Syringe:  #2 75mg  Prefilled Syringe: #N/A Ordered Date: 01/30/20 Expected date of arrival: 02/02/20 Ordered by: [place]: [facility]

## 2020-02-02 NOTE — Telephone Encounter (Signed)
Xolair Prefilled Syringe Received:  150mg  Prefilled Syringe >> quantity #2, lot # [id number], exp date 08/29/2020 75mg  Prefilled Syringe >> N/A Medication arrival date: 02/02/20 Received by: Kali Deadwyler,LPN

## 2020-02-08 ENCOUNTER — Ambulatory Visit (INDEPENDENT_AMBULATORY_CARE_PROVIDER_SITE_OTHER): Payer: Medicare Other | Admitting: *Deleted

## 2020-02-08 ENCOUNTER — Other Ambulatory Visit: Payer: Self-pay | Admitting: Internal Medicine

## 2020-02-08 ENCOUNTER — Ambulatory Visit (INDEPENDENT_AMBULATORY_CARE_PROVIDER_SITE_OTHER): Payer: Medicare Other

## 2020-02-08 ENCOUNTER — Other Ambulatory Visit: Payer: Self-pay

## 2020-02-08 DIAGNOSIS — J449 Chronic obstructive pulmonary disease, unspecified: Secondary | ICD-10-CM | POA: Diagnosis not present

## 2020-02-08 DIAGNOSIS — J4550 Severe persistent asthma, uncomplicated: Secondary | ICD-10-CM | POA: Diagnosis not present

## 2020-02-08 DIAGNOSIS — I4891 Unspecified atrial fibrillation: Secondary | ICD-10-CM

## 2020-02-08 LAB — CUP PACEART REMOTE DEVICE CHECK
Battery Remaining Longevity: 71 mo
Battery Remaining Percentage: 65 %
Battery Voltage: 2.9 V
Brady Statistic AP VP Percent: 1 %
Brady Statistic AP VS Percent: 97 %
Brady Statistic AS VP Percent: 1 %
Brady Statistic AS VS Percent: 1.7 %
Brady Statistic RA Percent Paced: 98 %
Brady Statistic RV Percent Paced: 1 %
Date Time Interrogation Session: 20210210052540
Implantable Lead Implant Date: 20051106
Implantable Lead Implant Date: 20051106
Implantable Lead Location: 753859
Implantable Lead Location: 753860
Implantable Pulse Generator Implant Date: 20140115
Lead Channel Impedance Value: 360 Ohm
Lead Channel Impedance Value: 680 Ohm
Lead Channel Pacing Threshold Amplitude: 0.75 V
Lead Channel Pacing Threshold Amplitude: 1 V
Lead Channel Pacing Threshold Pulse Width: 0.4 ms
Lead Channel Pacing Threshold Pulse Width: 0.4 ms
Lead Channel Sensing Intrinsic Amplitude: 12 mV
Lead Channel Sensing Intrinsic Amplitude: 3.2 mV
Lead Channel Setting Pacing Amplitude: 2 V
Lead Channel Setting Pacing Amplitude: 2.5 V
Lead Channel Setting Pacing Pulse Width: 0.4 ms
Lead Channel Setting Sensing Sensitivity: 2 mV
Pulse Gen Model: 2210
Pulse Gen Serial Number: 7439597

## 2020-02-08 MED ORDER — OMALIZUMAB 150 MG/ML ~~LOC~~ SOSY
300.0000 mg | PREFILLED_SYRINGE | Freq: Once | SUBCUTANEOUS | Status: AC
  Administered 2020-02-08: 300 mg via SUBCUTANEOUS

## 2020-02-08 NOTE — Progress Notes (Signed)
Have you been hospitalized within the last 10 days?  No Do you have a fever?  No Do you have a cough?  No Do you have a headache or sore throat? No  

## 2020-02-08 NOTE — Progress Notes (Signed)
PPM Remote  

## 2020-02-15 ENCOUNTER — Ambulatory Visit: Payer: Medicare Other | Attending: Internal Medicine

## 2020-02-15 ENCOUNTER — Other Ambulatory Visit: Payer: Self-pay

## 2020-02-15 DIAGNOSIS — Z23 Encounter for immunization: Secondary | ICD-10-CM

## 2020-02-15 NOTE — Progress Notes (Signed)
   Covid-19 Vaccination Clinic  Name:  LEYLAND KENNA    MRN: [id number] DOB: 06/24/1946  02/15/2020  Mr. Vora was observed post Covid-19 immunization for 15 minutes without incidence. He was provided with Vaccine Information Sheet and instruction to access the V-Safe system.   Mr. Deaton was instructed to call 911 with any severe reactions post vaccine: Marland Kitchen Difficulty breathing  . Swelling of your face and throat  . A fast heartbeat  . A bad rash all over your body  . Dizziness and weakness    Immunizations Administered    Name Date Dose VIS Date Route   Moderna COVID-19 Vaccine 02/15/2020 12:45 PM 0.5 mL 11/29/2019 Intramuscular   Manufacturer: Moderna   Lot: [id number]   [place]: [id number]

## 2020-02-27 ENCOUNTER — Telehealth: Payer: Self-pay | Admitting: Pulmonary Disease

## 2020-02-27 NOTE — Telephone Encounter (Signed)
Xolair Prefilled Syringe Order: 150mg  Prefilled Syringe:  #2 75mg  Prefilled Syringe: N/A Ordered Date: 02/27/2020 Expected date of arrival: 03/01/2020 Ordered by: Desmond Dike, [place]  Specialty Pharmacy: [facility]

## 2020-03-01 NOTE — Telephone Encounter (Signed)
Xolair Prefilled Syringe Received:  150mg  Prefilled Syringe >> quantity #2, lot # [id number], exp date 09/2020 75mg  Prefilled Syringe >> N/A Medication arrival date: 03/01/2020 Received by: Desmond Dike, CMA

## 2020-03-07 ENCOUNTER — Encounter: Payer: Self-pay | Admitting: Pulmonary Disease

## 2020-03-07 ENCOUNTER — Other Ambulatory Visit: Payer: Self-pay

## 2020-03-07 ENCOUNTER — Ambulatory Visit: Payer: Medicare Other | Admitting: Pulmonary Disease

## 2020-03-07 VITALS — BP 124/72 | HR 91 | Temp 97.8°F | Ht 70.0 in | Wt 230.0 lb

## 2020-03-07 DIAGNOSIS — J9611 Chronic respiratory failure with hypoxia: Secondary | ICD-10-CM | POA: Diagnosis not present

## 2020-03-07 DIAGNOSIS — A498 Other bacterial infections of unspecified site: Secondary | ICD-10-CM

## 2020-03-07 DIAGNOSIS — J479 Bronchiectasis, uncomplicated: Secondary | ICD-10-CM | POA: Diagnosis not present

## 2020-03-07 DIAGNOSIS — J449 Chronic obstructive pulmonary disease, unspecified: Secondary | ICD-10-CM | POA: Diagnosis not present

## 2020-03-07 NOTE — Progress Notes (Signed)
Subjective:    Patient ID: Clarence Dawson, male    DOB: 1946/03/15, 74 y.o.   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  He is on Xolair for management of his IgE mediated asthma.  He is also clear and Qvar for control of his asthma/COPD overlap symptoms.  He was treated previously with voriconazole for approximately 4 months.  He has chronic hypoxic respiratory failure and issues with right middle lobe syndrome with negative bronchoscopy previously.  He has chronic colonization with Pseudomonas and bronchiectasis.  He is on tobramycin nebulized 28 days on and 28 days off.  He notes the character of his sputum has improved.  Presents with no new complaints.  He is on his off cycle of tobramycin usually does result in his sputum turning dark brown with occasional reddish hue.  Once he starts tobramycin the subsides.  He has had no fevers, chills or sweats.  As noted, bronchoscopy was performed on 23 June 2019 with no evidence of mass just copious purulent secretions possible edema.  Mucous plugs were noted on the right middle lobe.  He is well compensated with regards to his chronic respiratory failure on oxygen at liters per minute pulsed.  He has not had any fevers, chills or sweats.  No orthopnea or paroxysmal nocturnal dyspnea.  No lower extremity edema or calf tenderness.  No chest pain.  Overall he feels well and looks well.  Review of Systems A 10 point review of systems was performed and it is as noted above otherwise negative.  Immunizations: Reviewed, he is to receive second dose COVID-19 vaccine next week.  Uses NAC 600 mg twice daily for mucociliary clearance.  Allergies  Allergen Reactions  . Food Anaphylaxis and Shortness Of Breath    TREE NUTS  . Iodinated Diagnostic Agents Hives and Shortness Of Breath    Patient states hives to throat closing. (01/15/17: patient states this reaction  was "about 20 years ago" with possibly an IVP.  He now says high doses of prednisone "throw me into AFib."  He has tolerated CT arthrograms with Benadrly 50mg  PO one hour before injection.  Brita Romp, RN)  . Shellfish Allergy Anaphylaxis and Shortness Of Breath    To shellfish, crabs.  Makes him feel like "things are crawling all over" me.  Denies airway issues with these foods.  Brita Romp, RN 01/15/17)  . Goat-Derived Child psychotherapist   . Prednisone Palpitations    PRECIPITATES A-FIB  . Metformin And Related Diarrhea    High doses at once  . Vancomycin Anxiety  . Voltaren [Diclofenac Sodium] Other (See Comments)    Feels like things are crawling on him   Medications reviewed: He is on Qvar and Duaklir for control of his.  Notes great relief from [facility].    Objective:   Physical Exam BP 124/72 (BP Location: Left Arm, Cuff Size: Normal)   Pulse 91   Temp 97.8 F (36.6 C) (Temporal)   Ht 5\' 10"  (1.778 m)   Wt 230 lb (104.3 kg)   SpO2 94% Comment: 3 pulse  BMI 33.00 kg/m   GENERAL: Well-developed, obese patient in no acute distress.  He is comfortable on nasal cannula O2.  He is fully ambulatory. HEAD: Normocephalic, atraumatic.  EYES: Pupils equal, round, reactive to light.  No scleral icterus.  MOUTH: Nose/mouth/throat not examined due to masking requirements for  COVID 19.   NECK: Supple. No thyromegaly. Trachea midline. No JVD.  No adenopathy. PULMONARY: Coarse breath sounds, no wheezes or other adventitious sounds noted. CARDIOVASCULAR: S1 and S2. Regular rate and rhythm.  No rubs murmurs gallops heard.  Pacemaker on left. GASTROINTESTINAL: Obese otherwise benign. MUSCULOSKELETAL: No joint deformity, no clubbing, no edema.  NEUROLOGIC: No focal deficits, speech is fluent.  No gait disturbance noted.  Awake, alert and oriented. SKIN: Intact,warm,dry. PSYCH: Mood and behavior appropriate.      Assessment & Plan:     ICD-10-CM   1. Asthma-COPD overlap  syndrome (HCC)  J44.9    Continue Duaklir and Qvar Continue as needed albuterol  2. Bronchiectasis without complication ([place])  X77.4    Complicated by chronic Pseudomonas colonization TOBI as below  3. Chronic respiratory failure with hypoxia (HCC)  J96.11    Continue oxygen at 3 L/min, pulsed  4. Pseudomonas aeruginosa infection  A49.8    Continue TOBI 300 mg twice daily 28 days on, 28 days off   Discussion:  Complex pulmonary history.  Has asthma/COPD overlap syndrome, bronchiectasis with chronic Pseudomonas colonization/infection and chronic respiratory failure with hypoxia.  He appears well compensated today on his regimen.  He manages his very complex regimen well.  Continue same.  We will see him in follow-up in 2 months time he is to contact us prior to that time should any new difficulties arise.  Renold Don, MD [facility]  *This note was dictated using voice recognition software/Dragon.  Despite best efforts to proofread, errors can occur which can change the meaning.  Any change was purely unintentional.

## 2020-03-07 NOTE — Patient Instructions (Signed)
Continue medications as you are doing   We will see you in follow-up in 2 months time

## 2020-03-08 ENCOUNTER — Ambulatory Visit (INDEPENDENT_AMBULATORY_CARE_PROVIDER_SITE_OTHER): Payer: Medicare Other

## 2020-03-08 DIAGNOSIS — J4542 Moderate persistent asthma with status asthmaticus: Secondary | ICD-10-CM

## 2020-03-08 MED ORDER — OMALIZUMAB 150 MG/ML ~~LOC~~ SOSY
300.0000 mg | PREFILLED_SYRINGE | Freq: Once | SUBCUTANEOUS | Status: AC
  Administered 2020-03-08: 10:00:00 300 mg via SUBCUTANEOUS

## 2020-03-08 NOTE — Progress Notes (Signed)
All questions were answered by the patient before medication was administered. Have you been hospitalized in the last 10 days? No Do you have a fever? No Do you have a cough? No Do you have a headache or sore throat? No  

## 2020-03-14 ENCOUNTER — Ambulatory Visit: Payer: Medicare Other | Attending: Internal Medicine

## 2020-03-14 DIAGNOSIS — Z23 Encounter for immunization: Secondary | ICD-10-CM

## 2020-03-14 NOTE — Progress Notes (Signed)
   Covid-19 Vaccination Clinic  Name:  KAIDYN JAVID    MRN: [id number] DOB: [birth year]  03/14/2020  Mr. Sheppard was observed post Covid-19 immunization for 15 minutes without incident. He was provided with Vaccine Information Sheet and instruction to access the V-Safe system.   Mr. Macgowan was instructed to call 911 with any severe reactions post vaccine: Marland Kitchen Difficulty breathing  . Swelling of face and throat  . A fast heartbeat  . A bad rash all over body  . Dizziness and weakness   Immunizations Administered    Name Date Dose VIS Date Route   Moderna COVID-19 Vaccine 03/14/2020 12:35 PM 0.5 mL 11/29/2019 Intramuscular   Manufacturer: Moderna   Lot: [id number]   [place]: [phone]

## 2020-03-16 ENCOUNTER — Encounter: Payer: Self-pay | Admitting: Pulmonary Disease

## 2020-03-19 ENCOUNTER — Telehealth: Payer: Self-pay | Admitting: Pulmonary Disease

## 2020-03-19 NOTE — Telephone Encounter (Signed)
noted 

## 2020-03-26 ENCOUNTER — Telehealth: Payer: Self-pay | Admitting: Pulmonary Disease

## 2020-03-26 NOTE — Telephone Encounter (Signed)
Xolair Prefilled Syringe Order: 150mg  Prefilled Syringe:  #2 75mg  Prefilled Syringe: #0 Ordered Date: 03/26/2020 Expected date of arrival: 03/28/2020 Ordered by: Desmond Dike, [place]  Specialty Pharmacy: [facility]

## 2020-03-28 NOTE — Telephone Encounter (Signed)
Xolair Prefilled Syringe Received:  150mg  Prefilled Syringe >> quantity #2, lot # [id number], exp date 10/2020 75mg  Prefilled Syringe >> N/A Medication arrival date: 03/28/2020 Received by: Desmond Dike, [place]

## 2020-04-05 ENCOUNTER — Other Ambulatory Visit: Payer: Self-pay

## 2020-04-05 ENCOUNTER — Ambulatory Visit (INDEPENDENT_AMBULATORY_CARE_PROVIDER_SITE_OTHER): Payer: Medicare Other

## 2020-04-05 DIAGNOSIS — J4542 Moderate persistent asthma with status asthmaticus: Secondary | ICD-10-CM | POA: Diagnosis not present

## 2020-04-05 MED ORDER — OMALIZUMAB 150 MG/ML ~~LOC~~ SOSY
300.0000 mg | PREFILLED_SYRINGE | Freq: Once | SUBCUTANEOUS | Status: AC
  Administered 2020-04-05: 300 mg via SUBCUTANEOUS

## 2020-04-05 NOTE — Progress Notes (Signed)
Have you been hospitalized within the last 10 days?  No Do you have a fever?  No Do you have a cough?  No Do you have a headache or sore throat? No  

## 2020-04-06 ENCOUNTER — Other Ambulatory Visit: Payer: Self-pay | Admitting: Pulmonary Disease

## 2020-04-08 ENCOUNTER — Other Ambulatory Visit: Payer: Self-pay | Admitting: Pulmonary Disease

## 2020-04-26 ENCOUNTER — Telehealth: Payer: Self-pay | Admitting: Pulmonary Disease

## 2020-04-26 ENCOUNTER — Other Ambulatory Visit: Payer: Self-pay | Admitting: Pulmonary Disease

## 2020-04-26 MED ORDER — DUAKLIR PRESSAIR 400-12 MCG/ACT IN AEPB: 1.0000 | INHALATION_SPRAY | Freq: Two times a day (BID) | RESPIRATORY_TRACT | 5 refills | Status: DC

## 2020-04-26 NOTE — Telephone Encounter (Signed)
Called and spoke with pt and verified preferred pharmacy to have med sent to. Med has been sent in for pt. Nothing further needed.

## 2020-04-26 NOTE — Telephone Encounter (Signed)
Xolair Prefilled Syringe Order: 150mg  Prefilled Syringe:  #2 75mg  Prefilled Syringe: #0 Ordered Date: 04/26/2020 Expected date of arrival: 05/01/2020 Ordered by: Desmond Dike, [place]  Specialty Pharmacy: [facility]

## 2020-04-30 ENCOUNTER — Other Ambulatory Visit: Payer: Self-pay | Admitting: Pulmonary Disease

## 2020-05-01 NOTE — Telephone Encounter (Signed)
ATC patient to see which medication he needs a refill on as well as which pharmacy. LMTCB

## 2020-05-01 NOTE — Telephone Encounter (Signed)
Xolair Prefilled Syringe Received:  150mg  Prefilled Syringe >> quantity #2, lot # [id number], exp date 10/2020 75mg  Prefilled Syringe >> N/A Medication arrival date: 05/01/2020 Received by: Desmond Dike, CMA

## 2020-05-02 MED ORDER — QVAR REDIHALER 80 MCG/ACT IN AERB: 2.0000 | INHALATION_SPRAY | Freq: Two times a day (BID) | RESPIRATORY_TRACT | 2 refills | Status: DC

## 2020-05-02 NOTE — Telephone Encounter (Signed)
Refill has been sent to desired pharmacy. Nothing further needed at this time

## 2020-05-02 NOTE — Telephone Encounter (Signed)
QVAR REDIHALER 80 MCG  is the medication and pharmacy is [facility] in [place]

## 2020-05-03 ENCOUNTER — Ambulatory Visit (INDEPENDENT_AMBULATORY_CARE_PROVIDER_SITE_OTHER): Payer: Medicare Other

## 2020-05-03 ENCOUNTER — Other Ambulatory Visit: Payer: Self-pay

## 2020-05-03 DIAGNOSIS — J4542 Moderate persistent asthma with status asthmaticus: Secondary | ICD-10-CM | POA: Diagnosis not present

## 2020-05-03 MED ORDER — OMALIZUMAB 150 MG/ML ~~LOC~~ SOSY
300.0000 mg | PREFILLED_SYRINGE | Freq: Once | SUBCUTANEOUS | Status: AC
  Administered 2020-05-03: 300 mg via SUBCUTANEOUS

## 2020-05-03 NOTE — Progress Notes (Signed)
All questions were answered by the patient before medication was administered. Have you been hospitalized in the last 10 days? No Do you have a fever? No Do you have a cough? No Do you have a headache or sore throat? No  

## 2020-05-04 ENCOUNTER — Other Ambulatory Visit: Payer: Self-pay | Admitting: Pulmonary Disease

## 2020-05-09 ENCOUNTER — Ambulatory Visit (INDEPENDENT_AMBULATORY_CARE_PROVIDER_SITE_OTHER): Payer: Medicare Other | Admitting: *Deleted

## 2020-05-09 DIAGNOSIS — I495 Sick sinus syndrome: Secondary | ICD-10-CM

## 2020-05-09 LAB — CUP PACEART REMOTE DEVICE CHECK
Battery Remaining Longevity: 63 mo
Battery Remaining Percentage: 57 %
Battery Voltage: 2.89 V
Brady Statistic AP VP Percent: 1 %
Brady Statistic AP VS Percent: 98 %
Brady Statistic AS VP Percent: 1 %
Brady Statistic AS VS Percent: 1.1 %
Brady Statistic RA Percent Paced: 99 %
Brady Statistic RV Percent Paced: 1 %
Date Time Interrogation Session: 20210512030153
Implantable Lead Implant Date: 20051106
Implantable Lead Implant Date: 20051106
Implantable Lead Location: 753859
Implantable Lead Location: 753860
Implantable Pulse Generator Implant Date: 20140115
Lead Channel Impedance Value: 360 Ohm
Lead Channel Impedance Value: 690 Ohm
Lead Channel Pacing Threshold Amplitude: 0.75 V
Lead Channel Pacing Threshold Amplitude: 1 V
Lead Channel Pacing Threshold Pulse Width: 0.4 ms
Lead Channel Pacing Threshold Pulse Width: 0.4 ms
Lead Channel Sensing Intrinsic Amplitude: 12 mV
Lead Channel Sensing Intrinsic Amplitude: 3.2 mV
Lead Channel Setting Pacing Amplitude: 2 V
Lead Channel Setting Pacing Amplitude: 2.5 V
Lead Channel Setting Pacing Pulse Width: 0.4 ms
Lead Channel Setting Sensing Sensitivity: 2 mV
Pulse Gen Model: 2210
Pulse Gen Serial Number: 7439597

## 2020-05-09 MED ORDER — TOBRAMYCIN 300 MG/5ML IN NEBU: 300.0000 mg | INHALATION_SOLUTION | Freq: Two times a day (BID) | RESPIRATORY_TRACT | 5 refills | Status: DC

## 2020-05-09 NOTE — Telephone Encounter (Signed)
TOBI is 300 mg nebulized twice a day for 28 days.  He cycles 28 days on and 28 days off.  It should be a standing order.

## 2020-05-09 NOTE — Telephone Encounter (Signed)
Dr Patsey Berthold, can you please verify his rx for tobi- on med list it does not say frequency of neb. Thanks.

## 2020-05-10 ENCOUNTER — Other Ambulatory Visit: Payer: Self-pay

## 2020-05-10 ENCOUNTER — Ambulatory Visit: Payer: Medicare Other | Admitting: Pulmonary Disease

## 2020-05-10 ENCOUNTER — Encounter: Payer: Self-pay | Admitting: Pulmonary Disease

## 2020-05-10 VITALS — BP 172/68 | HR 75 | Temp 98.0°F | Ht 69.0 in | Wt 232.4 lb

## 2020-05-10 DIAGNOSIS — J479 Bronchiectasis, uncomplicated: Secondary | ICD-10-CM | POA: Diagnosis not present

## 2020-05-10 DIAGNOSIS — J449 Chronic obstructive pulmonary disease, unspecified: Secondary | ICD-10-CM

## 2020-05-10 DIAGNOSIS — J9611 Chronic respiratory failure with hypoxia: Secondary | ICD-10-CM | POA: Diagnosis not present

## 2020-05-10 DIAGNOSIS — R0683 Snoring: Secondary | ICD-10-CM

## 2020-05-10 DIAGNOSIS — A498 Other bacterial infections of unspecified site: Secondary | ICD-10-CM

## 2020-05-10 NOTE — Progress Notes (Signed)
Subjective:    Patient ID: Clarence Dawson, male    DOB: 1946/04/24, 74 y.o.   MRN: [id number]  HPI 74 year old remote former smoker (quit 1985) with a history of asthma/COPD overlap syndrome, IgE mediated asthma, bronchiectasis with chronic Pseudomonas colonization and chronic respiratory failure with hypoxia who presents for scheduled visit.  He has been doing well with his current regimen.  He is on Duaklir and Qvar for management of his asthma/COPD, he is on TOBI nebulized a 28 days on/28 days off.  He is on oxygen at 3 L/min pulse.  He wonders if he can come off of his oxygen.  He has had no change in the character of his sputum he has noted overall marked improvement on this since he started TOBI.  He does follow airway clearance management as well.  The patient is on Xolair injectable for his IgE mediated asthma.  He has had no fevers, chills or sweats.  No chest pain, no lower extremity edema.  No calf tenderness.  No orthopnea or paroxysmal nocturnal dyspnea.  Dyspnea with exertion is at baseline.  He does well with supplemental oxygen.  He continues to wonder if he can get off of oxygen.  Presents with his wife today and she notes that he has issues with snoring and apneic episodes.   Review of Systems  A 10 point review of systems was performed and it is as noted above otherwise negative.  Allergies  Allergen Reactions  . Food Anaphylaxis and Shortness Of Breath    TREE NUTS  . Iodinated Diagnostic Agents Hives and Shortness Of Breath    Patient states hives to throat closing. (01/15/17: patient states this reaction was "about 20 years ago" with possibly an IVP.  He now says high doses of prednisone "throw me into AFib."  He has tolerated CT arthrograms with Benadrly 50mg  PO one hour before injection.  Brita Romp, RN)  . Shellfish Allergy Anaphylaxis and Shortness Of Breath    To shellfish, crabs.  Makes him feel like "things are crawling all over" me.  Denies airway issues with  these foods.  Brita Romp, RN 01/15/17)  . Goat-Derived Child psychotherapist   . Prednisone Palpitations    PRECIPITATES A-FIB  . Metformin And Related Diarrhea    High doses at once  . Vancomycin Anxiety  . Voltaren [Diclofenac Sodium] Other (See Comments)    Feels like things are crawling on him   Current Meds  Medication Sig  . acetaminophen (TYLENOL) 500 MG tablet Take 1,000 mg by mouth 3 (three) times daily.  Marland Kitchen albuterol (ACCUNEB) 0.63 MG/3ML nebulizer solution Take 3 mLs by nebulization every 4 (four) hours as needed for shortness of breath.  Marland Kitchen albuterol (PROVENTIL HFA;VENTOLIN HFA) 108 (90 Base) MCG/ACT inhaler Inhale 2 puffs every 4 (four) hours as needed into the lungs for shortness of breath (only if you can't catch your breath).  Marland Kitchen atorvastatin (LIPITOR) 20 MG tablet Take 20 mg by mouth at bedtime.  . beclomethasone (QVAR REDIHALER) 80 MCG/ACT inhaler Inhale 2 puffs into the lungs 2 (two) times daily.  . bisoprolol (ZEBETA) 5 MG tablet TAKE 1 AND 1/2 TABLET BY MOUTH TWICE DAILY.  . Carboxymethylcellul-Glycerin (LUBRICATING EYE DROPS OP) Place 1 drop into the right eye daily as needed (dry eyes).  Marland Kitchen diltiazem (CARDIZEM CD) 120 MG 24 hr capsule Take 1 capsule (120 mg total) by mouth daily.  . diphenhydramine-acetaminophen (TYLENOL PM) 25-500 MG TABS tablet Take 1  tablet by mouth at bedtime.  Alfredia Client PRESSAIR 400-12 MCG/ACT AEPB Take 1 puff by mouth in the morning and at bedtime.  Marland Kitchen EPINEPHrine 0.3 mg/0.3 mL IJ SOAJ injection Inject 0.3 mg into the muscle as needed for anaphylaxis.   . flecainide (TAMBOCOR) 150 MG tablet TAKE 1/2 TABLET BY MOUTH TWICE DAILY.  Marland Kitchen glipiZIDE (GLUCOTROL XL) 5 MG 24 hr tablet Take 5 mg by mouth daily before breakfast.  . hydrocortisone cream 1 % Apply 1 application topically daily as needed for itching.  Marland Kitchen JARDIANCE 10 MG TABS tablet Take 10 mg by mouth daily.  Marland Kitchen levothyroxine (SYNTHROID, LEVOTHROID) 137 MCG tablet Take 137 mcg by mouth  daily before breakfast.   . losartan (COZAAR) 50 MG tablet Take 50 mg by mouth at bedtime.  . metFORMIN (GLUCOPHAGE-XR) 500 MG 24 hr tablet Take 1,000 mg by mouth at bedtime.   . montelukast (SINGULAIR) 10 MG tablet TAKE (1) TABLET BY MOUTH AT BEDTIME.  . Multiple Vitamin (MULTIVITAMIN) tablet Take 1 tablet by mouth daily.    Marland Kitchen omeprazole (PRILOSEC OTC) 20 MG tablet Take 20 mg by mouth daily.   Marland Kitchen Respiratory Therapy Supplies (FLUTTER) DEVI Use as directed  . rivaroxaban (XARELTO) 20 MG TABS tablet Take 20 mg daily with supper by mouth.  . tobramycin, PF, (TOBI) 300 MG/5ML nebulizer solution Take 5 mLs (300 mg total) by nebulization in the morning and at bedtime.  Arvid Right 150 MG/ML prefilled syringe INJECT 300MG  SUBCUTANEOUSLY EVERY 4 WEEKS (GIVEN AT MD  OFFICE)   Immunization History  Administered Date(s) Administered  . Fluad Quad(high Dose 65+) 09/12/2019  . Influenza Split 11/14/2015, 10/11/2018  . Influenza, High Dose Seasonal PF 09/28/2017  . Influenza-Unspecified 11/02/2016  . Moderna SARS-COVID-2 Vaccination 02/15/2020, 03/14/2020  . Pneumococcal Conjugate-13 09/28/2015  . Tdap 11/28/2019       Objective:   Physical Exam  BP (!) 172/68 (BP Location: Right Arm, Cuff Size: Normal)   Pulse 75   Temp 98 F (36.7 C) (Temporal)   Ht 5\' 9"  (1.753 m)   Wt 232 lb 6.4 oz (105.4 kg)   SpO2 98%   BMI 34.32 kg/m   GENERAL: Well-developed, obese patient in no acute distress.  He is comfortable on nasal cannula O2.  He is fully ambulatory. HEAD: Normocephalic, atraumatic.  EYES: Pupils equal, round, reactive to light.  No scleral icterus.  MOUTH: Nose/mouth/throat not examined due to masking requirements for COVID 19.   NECK: Supple. No thyromegaly. Trachea midline. No JVD.  No adenopathy. PULMONARY: Coarse breath sounds, no wheezes or other adventitious sounds noted. CARDIOVASCULAR: S1 and S2. Regular rate and rhythm.  No rubs murmurs gallops heard.  Pacemaker on  left. GASTROINTESTINAL: Obese otherwise benign. MUSCULOSKELETAL: No joint deformity, no clubbing, no edema.  NEUROLOGIC: No focal deficits, speech is fluent.  No gait disturbance noted.  Awake, alert and oriented. SKIN: Intact,warm,dry. PSYCH: Mood and behavior appropriate.  We performed ambulatory oximetry today: The patient did desaturate with ambulation on room air and require being placed back on oxygen supplementation to maintain oxygen saturations at 90% or better.      Assessment & Plan:     ICD-10-CM   1. Asthma-COPD overlap syndrome (HCC)  J44.9    Continue Duaklir and Qvar Continue as needed albuterol  2. Chronic respiratory failure with hypoxia (HCC)  J96.11    Continue use of oxygen with ambulation and with sleep May be off oxygen while reading and with "quiet activity".  3. Bronchiectasis without  complication (HCC)  P49.8    Continue pulmonary hygiene NAC 600 mg twice a day TOBI twice a day 28 days on and 28 days off  4. Pseudomonas aeruginosa infection  A49.8    Toby as above  5. Snoring  R06.83 Split night study   Obtain sleep study Schedule at [facility] at patient's request    Discussion:  Patient continues to do well on his current regimen.  It is quite a complex regimen but he is able to follow it.  He was wondering if he could come off of oxygen however, he still shows significant desaturation with ambulation.  He may however be off oxygen during reading or quiet activity.  Patient's wife reports that previously he had been diagnosed with sleep apnea but he had not been issued equipment.  This was tested elsewhere.  We will proceed with obtaining an in lab sleep study (split-night) for evaluation of this issue.  4 months time he is to contact us prior to that time should any new difficulties arise.   Renold Don, MD [facility]  *This note was dictated using voice recognition software/Dragon.  Despite best efforts to proofread, errors can occur which  can change the meaning.  Any change was purely unintentional.

## 2020-05-10 NOTE — Progress Notes (Signed)
Remote pacemaker transmission.   

## 2020-05-10 NOTE — Patient Instructions (Signed)
We are going to schedule you for a sleep study.  You may be off oxygen for short strolls around the garden, when reading or watching TV, continue to use it during exertion and during sleep.  We will see him in follow-up in 4 months time call sooner should any new difficulties arise

## 2020-05-14 ENCOUNTER — Other Ambulatory Visit (HOSPITAL_BASED_OUTPATIENT_CLINIC_OR_DEPARTMENT_OTHER): Payer: Self-pay

## 2020-05-15 ENCOUNTER — Other Ambulatory Visit (HOSPITAL_BASED_OUTPATIENT_CLINIC_OR_DEPARTMENT_OTHER): Payer: Self-pay

## 2020-05-17 ENCOUNTER — Encounter: Payer: Self-pay | Admitting: Pulmonary Disease

## 2020-05-30 ENCOUNTER — Telehealth: Payer: Self-pay | Admitting: Pulmonary Disease

## 2020-05-30 NOTE — Telephone Encounter (Signed)
Xolair Prefilled Syringe Order: 150mg  Prefilled Syringe:  #2 75mg  Prefilled Syringe: #0 Ordered Date: 05/30/2020 Expected date of arrival: 06/04/2020 Ordered by: Desmond Dike, [place]  Specialty Pharmacy: [facility]

## 2020-06-05 ENCOUNTER — Other Ambulatory Visit: Payer: Self-pay

## 2020-06-05 ENCOUNTER — Ambulatory Visit (INDEPENDENT_AMBULATORY_CARE_PROVIDER_SITE_OTHER): Payer: Medicare Other

## 2020-06-05 DIAGNOSIS — J449 Chronic obstructive pulmonary disease, unspecified: Secondary | ICD-10-CM

## 2020-06-05 MED ORDER — OMALIZUMAB 150 MG/ML ~~LOC~~ SOSY
300.0000 mg | PREFILLED_SYRINGE | Freq: Once | SUBCUTANEOUS | Status: AC
Start: 2020-06-05 — End: 2020-06-05
  Administered 2020-06-05: 300 mg via SUBCUTANEOUS

## 2020-06-05 NOTE — Progress Notes (Signed)
Have you been hospitalized within the last 10 days?  No Do you have a fever?  No Do you have a cough?  No Do you have a headache or sore throat? No  

## 2020-06-05 NOTE — Telephone Encounter (Signed)
Xolair Prefilled Syringe Received:  150mg  Prefilled Syringe >> quantity #2, lot # [id number], exp date 03/27/2021 75mg  Prefilled Syringe >> N/A Medication arrival date: 06/05/20 Received by: Nykeria Mealing,LPN

## 2020-06-06 ENCOUNTER — Other Ambulatory Visit (HOSPITAL_COMMUNITY)
Admission: RE | Admit: 2020-06-06 | Discharge: 2020-06-06 | Disposition: A | Discharge status: Home / Self Care | Payer: Medicare Other | Source: Ambulatory Visit | Attending: Pulmonary Disease | Admitting: Pulmonary Disease

## 2020-06-06 DIAGNOSIS — Z20822 Contact with and (suspected) exposure to covid-19: Secondary | ICD-10-CM | POA: Diagnosis not present

## 2020-06-06 DIAGNOSIS — Z01812 Encounter for preprocedural laboratory examination: Secondary | ICD-10-CM | POA: Diagnosis present

## 2020-06-06 LAB — SARS CORONAVIRUS 2 (TAT 6-24 HRS): SARS Coronavirus 2: NEGATIVE

## 2020-06-08 ENCOUNTER — Ambulatory Visit: Payer: Medicare Other | Attending: Pulmonary Disease | Admitting: Neurology

## 2020-06-08 ENCOUNTER — Other Ambulatory Visit: Payer: Self-pay

## 2020-06-08 DIAGNOSIS — R0683 Snoring: Secondary | ICD-10-CM | POA: Insufficient documentation

## 2020-06-14 NOTE — Procedures (Signed)
Godley A. Merlene Laughter, MD     [email or web address]             NOCTURNAL POLYSOMNOGRAPHY   LOCATION: [facility]  Patient Name: Clarence Dawson, Clarence Dawson Date: 06/08/2020 Gender: Male D.O.B: Jun 21, 1946 Age (years): 4 Referring Provider: Vernard Gambles MD Height (inches): 59 Interpreting Physician: Phillips Odor MD, ABSM Weight (lbs): 232 RPSGT: Peak, Robert BMI: 47 MRN: [id number] Neck Size: 17.00 CLINICAL INFORMATION Sleep Study Type: NPSG     Indication for sleep study: N/A     Epworth Sleepiness Score: 3     SLEEP STUDY TECHNIQUE As per the AASM Manual for the Scoring of Sleep and Associated Events v2.3 (April 2016) with a hypopnea requiring 4% desaturations.  The channels recorded and monitored were frontal, central and occipital EEG, electrooculogram (EOG), submentalis EMG (chin), nasal and oral airflow, thoracic and abdominal wall motion, anterior tibialis EMG, snore microphone, electrocardiogram, and pulse oximetry.  MEDICATIONS Medications self-administered by patient taken the night of the study : N/A  Current Outpatient Medications:  .  acetaminophen (TYLENOL) 500 MG tablet, Take 1,000 mg by mouth 3 (three) times daily., Disp: , Rfl:  .  albuterol (ACCUNEB) 0.63 MG/3ML nebulizer solution, Take 3 mLs by nebulization every 4 (four) hours as needed for shortness of breath., Disp: , Rfl:  .  albuterol (PROVENTIL HFA;VENTOLIN HFA) 108 (90 Base) MCG/ACT inhaler, Inhale 2 puffs every 4 (four) hours as needed into the lungs for shortness of breath (only if you can't catch your breath)., Disp: , Rfl:  .  atorvastatin (LIPITOR) 20 MG tablet, Take 20 mg by mouth at bedtime., Disp: , Rfl:  .  beclomethasone (QVAR REDIHALER) 80 MCG/ACT inhaler, Inhale 2 puffs into the lungs 2 (two) times daily., Disp: 10.6 g, Rfl: 2 .  bisoprolol (ZEBETA) 5 MG tablet, TAKE 1 AND 1/2 TABLET BY MOUTH TWICE DAILY., Disp: 90 tablet, Rfl: 3 .   Carboxymethylcellul-Glycerin (LUBRICATING EYE DROPS OP), Place 1 drop into the right eye daily as needed (dry eyes)., Disp: , Rfl:  .  diltiazem (CARDIZEM CD) 120 MG 24 hr capsule, Take 1 capsule (120 mg total) by mouth daily., Disp: 30 capsule, Rfl: 6 .  diphenhydramine-acetaminophen (TYLENOL PM) 25-500 MG TABS tablet, Take 1 tablet by mouth at bedtime., Disp: , Rfl:  .  DUAKLIR PRESSAIR 400-12 MCG/ACT AEPB, Take 1 puff by mouth in the morning and at bedtime., Disp: 1 each, Rfl: 5 .  EPINEPHrine 0.3 mg/0.3 mL IJ SOAJ injection, Inject 0.3 mg into the muscle as needed for anaphylaxis. , Disp: , Rfl:  .  flecainide (TAMBOCOR) 150 MG tablet, TAKE 1/2 TABLET BY MOUTH TWICE DAILY., Disp: 90 tablet, Rfl: 2 .  furosemide (LASIX) 40 MG tablet, Take 1 tablet (40 mg total) by mouth daily., Disp: 90 tablet, Rfl: 3 .  glipiZIDE (GLUCOTROL XL) 5 MG 24 hr tablet, Take 5 mg by mouth daily before breakfast., Disp: , Rfl:  .  hydrocortisone cream 1 %, Apply 1 application topically daily as needed for itching., Disp: , Rfl:  .  JARDIANCE 10 MG TABS tablet, Take 10 mg by mouth daily., Disp: , Rfl:  .  levothyroxine (SYNTHROID, LEVOTHROID) 137 MCG tablet, Take 137 mcg by mouth daily before breakfast. , Disp: , Rfl:  .  losartan (COZAAR) 50 MG tablet, Take 50 mg by mouth at bedtime., Disp: , Rfl:  .  metFORMIN (GLUCOPHAGE-XR) 500 MG 24 hr tablet, Take 1,000 mg by mouth at bedtime. , Disp: , Rfl:  .  montelukast (SINGULAIR) 10 MG tablet, TAKE (1) TABLET BY MOUTH AT BEDTIME., Disp: 90 tablet, Rfl: 0 .  Multiple Vitamin (MULTIVITAMIN) tablet, Take 1 tablet by mouth daily.  , Disp: , Rfl:  .  omeprazole (PRILOSEC OTC) 20 MG tablet, Take 20 mg by mouth daily. , Disp: , Rfl:  .  Respiratory Therapy Supplies (FLUTTER) DEVI, Use as directed, Disp: 1 each, Rfl: 0 .  rivaroxaban (XARELTO) 20 MG TABS tablet, Take 20 mg daily with supper by mouth., Disp: , Rfl:  .  tobramycin, PF, (TOBI) 300 MG/5ML nebulizer solution, Take 5 mLs  (300 mg total) by nebulization in the morning and at bedtime., Disp: 280 mL, Rfl: 5 .  XOLAIR 150 MG/ML prefilled syringe, INJECT 300MG  SUBCUTANEOUSLY EVERY 4 WEEKS (GIVEN AT MD  OFFICE), Disp: 4 mL, Rfl: 5     SLEEP ARCHITECTURE The study was initiated at 9:42:43 PM and ended at 4:49:12 AM.  Sleep onset time was 1.7 minutes and the sleep efficiency was 92.4%%. The total sleep time was 394 minutes.  Stage REM latency was 91.0 minutes.  The patient spent 5.7%% of the night in stage N1 sleep, 76.9%% in stage N2 sleep, 2.0%% in stage N3 and 15.4% in REM.  Alpha intrusion was absent.  Supine sleep was 100.00%.  RESPIRATORY PARAMETERS The overall apnea/hypopnea index (AHI) was 12.2 per hour. There were 72 total apneas, including 64 obstructive, 7 central and 1 mixed apneas. There were 8 hypopneas and 13 RERAs.  The AHI during Stage REM sleep was 52.6 per hour.  AHI while supine was 12.2 per hour.  The mean oxygen saturation was 92.8%. The minimum SpO2 during sleep was 80.0%.  loud snoring was noted during this study.  CARDIAC DATA The 2 lead EKG demonstrated pacemaker generated. The mean heart rate was 73.9 beats per minute. Other EKG findings include: None.  LEG MOVEMENT DATA The total PLMS were 0 with a resulting PLMS index of 0.0. Associated arousal with leg movement index was 0.0.  IMPRESSIONS 1. 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 Auto PAP 10-20 is recommended.   Delano Metz, MD Diplomate, American Board of Sleep Medicine. ELECTRONICALLY SIGNED ON:  06/14/2020, 3:03 PM [place] PH: [phone]   FX: [phone] [place]

## 2020-06-25 ENCOUNTER — Telehealth: Payer: Self-pay | Admitting: Pulmonary Disease

## 2020-06-25 NOTE — Telephone Encounter (Signed)
Xolair Prefilled Syringe Order: 150mg  Prefilled Syringe:  #2 75mg  Prefilled Syringe: #0 Ordered Date: 06/25/2020 Expected date of arrival: 06/27/2020 Ordered by: Desmond Dike, [place]  Specialty Pharmacy: [facility]

## 2020-06-27 DIAGNOSIS — G4733 Obstructive sleep apnea (adult) (pediatric): Secondary | ICD-10-CM

## 2020-06-27 NOTE — Telephone Encounter (Signed)
Dr. Gonzalez please advise. thanks 

## 2020-06-27 NOTE — Telephone Encounter (Signed)
The "results" noted are just the notes from the technician.  The formal interpretation by a physician has not been done.  Not read the studies.  They are sent to our [place] counterparts in [place] who read the studies.  Once I have the formal report from the physician we will determine what else needs to be done.  Dr. Darnell Level.

## 2020-06-27 NOTE — Telephone Encounter (Signed)
Xolair Prefilled Syringe Received:  150mg  Prefilled Syringe >> quantity #2, lot # [id number], exp date 07/2021 75mg  Prefilled Syringe >> N/A Medication arrival date: 06/27/2020 Received by: Desmond Dike, [place]

## 2020-06-28 NOTE — Telephone Encounter (Addendum)
-----   Message from Tyler Pita, MD sent at 06/28/2020  2:29 PM EDT -----  Editor: Tyler Pita, MD (Physician)    Added by: [x] Gonzalez, Carmen L, MD  [] Hover for details Results of the sleep study finally available, he has OSA which is mild to moderate.  It is recommended that he have a change on his CPAP machine to an AutoPap with pressures between 10 to 20 cmH2O. We Will make that change on his CPAP equipment and download compliance monitoring 2 weeks.  Recommend a ResMed unit as there has been a recall on [place] units.     Pt is aware of results via telephone and voiced his understanding. Order has been placed for cpap, as pt wished to proceed. Pt does not currently have a cpap. Pt has been scheduled for 09/13/2020. Nothing further is needed.

## 2020-06-28 NOTE — Progress Notes (Signed)
Results of the sleep study finally available, he has OSA which is mild to moderate.  It is recommended that he have a change on his CPAP machine to an AutoPap with pressures between 10 to 20 cmH2O. We Will make that change on his CPAP equipment and download compliance monitoring 2 weeks.  Recommend a ResMed unit as there has been a recall on [place] units.

## 2020-07-03 ENCOUNTER — Telehealth: Payer: Self-pay | Admitting: Pulmonary Disease

## 2020-07-03 ENCOUNTER — Other Ambulatory Visit: Payer: Self-pay

## 2020-07-03 ENCOUNTER — Ambulatory Visit (INDEPENDENT_AMBULATORY_CARE_PROVIDER_SITE_OTHER): Payer: Medicare Other

## 2020-07-03 DIAGNOSIS — J449 Chronic obstructive pulmonary disease, unspecified: Secondary | ICD-10-CM | POA: Diagnosis not present

## 2020-07-03 DIAGNOSIS — J4489 Other specified chronic obstructive pulmonary disease: Secondary | ICD-10-CM

## 2020-07-03 MED ORDER — OMALIZUMAB 150 MG/ML ~~LOC~~ SOSY
300.0000 mg | PREFILLED_SYRINGE | Freq: Once | SUBCUTANEOUS | Status: AC
Start: 2020-07-03 — End: 2020-07-03
  Administered 2020-07-03: 300 mg via SUBCUTANEOUS

## 2020-07-03 NOTE — Progress Notes (Signed)
Have you been hospitalized within the last 10 days?  No Do you have a fever?  No Do you have a cough?  No Do you have a headache or sore throat? No Do you have your Epi Pen visible and is it within date?  Yes 

## 2020-07-03 NOTE — Telephone Encounter (Signed)
Spoke to [place] with [employer], who is requesting a secondary diagnosis for cpap due to pt's AHI being low.  Clarence Dawson is questioning if pt has a history of daytime sleepiness, mood disorder, hypertension, heart disease or stroke.  This is required by medicare.   Dr. Patsey Berthold please advise. Thanks

## 2020-07-04 NOTE — Telephone Encounter (Signed)
Lm for Clarence Dawson with [employer].

## 2020-07-04 NOTE — Telephone Encounter (Signed)
Summary of active problems printed for DME company.

## 2020-07-04 NOTE — Telephone Encounter (Signed)
List of diagnosis have been faxed to [employer].  Clarence Dawson is aware and voiced her understanding.  Nothing further is needed.

## 2020-07-04 NOTE — Telephone Encounter (Addendum)
Lm for [employer] RT department.

## 2020-07-04 NOTE — Telephone Encounter (Signed)
[place] called back, please return call.

## 2020-07-04 NOTE — Telephone Encounter (Signed)
Spoke to [place] with [employer] and relayed below message.  Clarence Dawson has requested that last OV note be updated to state hx of HTN.  Please fax updated notes to [phone].  Dr. Patsey Berthold, please advise. Thanks

## 2020-07-04 NOTE — Telephone Encounter (Signed)
The patient has prior history of sleep apnea this was being done to renew his equipment.  Severity of sleep apnea can vary from day-to-day.  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

## 2020-07-17 ENCOUNTER — Telehealth: Payer: Self-pay

## 2020-07-17 MED ORDER — DILTIAZEM HCL ER COATED BEADS 120 MG PO CP24: 120.0000 mg | ORAL_CAPSULE | Freq: Every day | ORAL | 1 refills | Status: DC

## 2020-07-17 NOTE — Telephone Encounter (Signed)
**Note De-Identified  Obfuscation** The pt sent [place] a message through [facility] requesting a refill but did not say which medication he needs filled. I called [facility] but they did not know which of his meds he needs refilled so I called the pts home and s/w his wife Butch Penny. Per Butch Penny the pt needs his Diltiazem filled and that he only has 4 capsules left. I advised Butch Penny that I will e-scribe the pts Diltiazem refill to [facility]. to fill She thanked me for calling them and for taking care of this refill.  Diltiazem 120 mg  # 90 with 1 refill e-scribed to [facility]  as requested.

## 2020-07-19 ENCOUNTER — Other Ambulatory Visit: Payer: Self-pay | Admitting: Internal Medicine

## 2020-07-23 ENCOUNTER — Telehealth: Payer: Self-pay | Admitting: Pulmonary Disease

## 2020-07-23 NOTE — Telephone Encounter (Signed)
Xolair Prefilled Syringe Order: 150mg  Prefilled Syringe:  #2 75mg  Prefilled Syringe: #N/A Ordered Date: 07/23/20 Expected date of arrival: 07/26/20 Ordered by: [place]: Lennette Bihari

## 2020-07-25 NOTE — Telephone Encounter (Signed)
Xolair Prefilled Syringe Received:  150mg  Prefilled Syringe >> quantity #2, lot # [id number], exp date 05/2021 75mg  Prefilled Syringe >> N/A Medication arrival date: 07/25/2020 Received by: Desmond Dike, [place]

## 2020-07-31 ENCOUNTER — Ambulatory Visit (INDEPENDENT_AMBULATORY_CARE_PROVIDER_SITE_OTHER): Payer: Medicare Other

## 2020-07-31 ENCOUNTER — Other Ambulatory Visit: Payer: Self-pay

## 2020-07-31 DIAGNOSIS — J4542 Moderate persistent asthma with status asthmaticus: Secondary | ICD-10-CM

## 2020-07-31 MED ORDER — OMALIZUMAB 150 MG/ML ~~LOC~~ SOSY
300.0000 mg | PREFILLED_SYRINGE | Freq: Once | SUBCUTANEOUS | Status: AC
  Administered 2020-07-31: 300 mg via SUBCUTANEOUS

## 2020-07-31 NOTE — Progress Notes (Signed)
All questions were answered by the patient before medication was administered. Have you been hospitalized in the last 10 days? No Do you have a fever? No Do you have a cough? No Do you have a headache or sore throat? No  

## 2020-08-08 ENCOUNTER — Ambulatory Visit (INDEPENDENT_AMBULATORY_CARE_PROVIDER_SITE_OTHER): Payer: Medicare Other | Admitting: *Deleted

## 2020-08-08 DIAGNOSIS — I480 Paroxysmal atrial fibrillation: Secondary | ICD-10-CM

## 2020-08-08 LAB — CUP PACEART REMOTE DEVICE CHECK
Battery Remaining Longevity: 56 mo
Battery Remaining Percentage: 51 %
Battery Voltage: 2.87 V
Brady Statistic AP VP Percent: 1 %
Brady Statistic AP VS Percent: 98 %
Brady Statistic AS VP Percent: 1 %
Brady Statistic AS VS Percent: 1 %
Brady Statistic RA Percent Paced: 99 %
Brady Statistic RV Percent Paced: 1 %
Date Time Interrogation Session: 20210811033123
Implantable Lead Implant Date: 20051106
Implantable Lead Implant Date: 20051106
Implantable Lead Location: 753859
Implantable Lead Location: 753860
Implantable Pulse Generator Implant Date: 20140115
Lead Channel Impedance Value: 360 Ohm
Lead Channel Impedance Value: 660 Ohm
Lead Channel Pacing Threshold Amplitude: 0.75 V
Lead Channel Pacing Threshold Amplitude: 1 V
Lead Channel Pacing Threshold Pulse Width: 0.4 ms
Lead Channel Pacing Threshold Pulse Width: 0.4 ms
Lead Channel Sensing Intrinsic Amplitude: 1.7 mV
Lead Channel Sensing Intrinsic Amplitude: 11.5 mV
Lead Channel Setting Pacing Amplitude: 2 V
Lead Channel Setting Pacing Amplitude: 2.5 V
Lead Channel Setting Pacing Pulse Width: 0.4 ms
Lead Channel Setting Sensing Sensitivity: 2 mV
Pulse Gen Model: 2210
Pulse Gen Serial Number: 7439597

## 2020-08-10 ENCOUNTER — Other Ambulatory Visit: Payer: Self-pay | Admitting: Pulmonary Disease

## 2020-08-10 ENCOUNTER — Other Ambulatory Visit: Payer: Self-pay | Admitting: Internal Medicine

## 2020-08-10 NOTE — Progress Notes (Signed)
Remote pacemaker transmission.   

## 2020-08-20 ENCOUNTER — Telehealth: Payer: Self-pay | Admitting: Pulmonary Disease

## 2020-08-20 NOTE — Telephone Encounter (Signed)
Xolair Prefilled Syringe Order: 150mg  Prefilled Syringe:  #2 75mg  Prefilled Syringe: #N/A Ordered Date: 08/20/20 Expected date of arrival: 08/22/20 Ordered by: [place]: Lennette Bihari

## 2020-08-22 NOTE — Telephone Encounter (Signed)
Xolair Prefilled Syringe Received:  150mg  Prefilled Syringe >> quantity #2, lot # [id number], exp date 08/27/2021 75mg  Prefilled Syringe >> quantity #N/A, Medication arrival date: 08/22/20 Received by: Virdie Penning,LPN

## 2020-08-28 ENCOUNTER — Other Ambulatory Visit: Payer: Self-pay | Admitting: Pulmonary Disease

## 2020-08-28 ENCOUNTER — Ambulatory Visit: Payer: Medicare Other

## 2020-08-28 ENCOUNTER — Telehealth: Payer: Self-pay | Admitting: Pulmonary Disease

## 2020-08-28 NOTE — Telephone Encounter (Signed)
Patient called and rescheduled for 09/05/20, at 1530.  Nothing further at this time.

## 2020-09-05 ENCOUNTER — Other Ambulatory Visit: Payer: Self-pay

## 2020-09-05 ENCOUNTER — Ambulatory Visit (INDEPENDENT_AMBULATORY_CARE_PROVIDER_SITE_OTHER): Payer: Medicare Other

## 2020-09-05 DIAGNOSIS — J4542 Moderate persistent asthma with status asthmaticus: Secondary | ICD-10-CM | POA: Diagnosis not present

## 2020-09-05 MED ORDER — OMALIZUMAB 150 MG/ML ~~LOC~~ SOSY
300.0000 mg | PREFILLED_SYRINGE | Freq: Once | SUBCUTANEOUS | Status: AC
  Administered 2020-09-05: 300 mg via SUBCUTANEOUS

## 2020-09-05 NOTE — Progress Notes (Signed)
Have you been hospitalized within the last 10 days?  No Do you have a fever?  No Do you have a cough?  No Do you have a headache or sore throat? No Do you have your Epi Pen visible and is it within date?  Yes 

## 2020-09-10 ENCOUNTER — Other Ambulatory Visit: Payer: Self-pay | Admitting: Internal Medicine

## 2020-09-13 ENCOUNTER — Encounter: Payer: Self-pay | Admitting: Pulmonary Disease

## 2020-09-13 ENCOUNTER — Other Ambulatory Visit: Payer: Self-pay

## 2020-09-13 ENCOUNTER — Ambulatory Visit: Payer: Medicare Other | Admitting: Pulmonary Disease

## 2020-09-13 VITALS — BP 128/82 | HR 75 | Temp 96.9°F | Ht 69.0 in | Wt 229.6 lb

## 2020-09-13 DIAGNOSIS — G4733 Obstructive sleep apnea (adult) (pediatric): Secondary | ICD-10-CM

## 2020-09-13 DIAGNOSIS — A498 Other bacterial infections of unspecified site: Secondary | ICD-10-CM

## 2020-09-13 DIAGNOSIS — J449 Chronic obstructive pulmonary disease, unspecified: Secondary | ICD-10-CM

## 2020-09-13 DIAGNOSIS — J479 Bronchiectasis, uncomplicated: Secondary | ICD-10-CM

## 2020-09-13 DIAGNOSIS — J411 Mucopurulent chronic bronchitis: Secondary | ICD-10-CM

## 2020-09-13 DIAGNOSIS — R911 Solitary pulmonary nodule: Secondary | ICD-10-CM

## 2020-09-13 DIAGNOSIS — J9611 Chronic respiratory failure with hypoxia: Secondary | ICD-10-CM

## 2020-09-13 NOTE — Patient Instructions (Signed)
Continue to use CPAP as you are doing  We are going to schedule a CT scan of the chest to evaluate for bronchiectasis and to follow-up on your very small lung nodules  Continue your other medications  We will see you in follow-up in 3 months time call sooner should any new problems arise

## 2020-09-13 NOTE — Progress Notes (Addendum)
Subjective:    Patient ID: Clarence Dawson, male    DOB: 1946-04-06, 74 y.o.   MRN: [id number]  HPI Clarence Dawson is a 74 year old former smoker (quit 1985) with a history of asthma/COPD overlap syndrome, IgE mediated asthma, bronchiectasis and chronic Pseudomonas colonization with chronic respiratory failure who presents for a scheduled visit.  Recall that during his last visit a sleep study was obtained, the patient has moderate sleep apnea, he is currently on AutoSet CPAP.  He notes that since he started using AutoSet CPAP he is sleeping better.  He has more stamina during the day and awakens more refreshed.  He now feels that he would not be able to part with his CPAP therapy.  He did have some issues with waking up with his mouth quite dry we discussed solutions of settings with humidity that may help with this. Overall, he is well pleased with the therapy.  His compliance download shows excellent compliance with the device.  His pressures fluctuate between 14 to 19 cm of water pressure on a given night.  With regards to his bronchiectasis he continues to have issues with tenacious/copious secretions.  He uses Acapella religiously.  He is on mucolytic agents.  He is on tobramycin nebulized 28 days on and 28 days off for chronic Pseudomonas infection.  He experiences increase mucus clearance during the initial days of therapy with tobramycin.  He is compliant with his Duaklir and Qvar as well as Xolair for his asthma COPD overlap.  Overall he feels well and looks well.   Review of Systems A 10 point review of systems was performed and it is as noted above otherwise negative.    Objective:   Physical Exam BP 128/82 (BP Location: Left Arm, Cuff Size: Normal)   Pulse 75   Temp (!) 96.9 F (36.1 C) (Temporal)   Ht 5\' 9"  (1.753 m)   Wt 229 lb 9.6 oz (104.1 kg)   SpO2 93%   BMI 33.91 kg/m   GENERAL: Well-developed, obese patient in no acute distress.  He is comfortable on nasal cannula O2.  He is  fully ambulatory. HEAD: Normocephalic, atraumatic.  EYES: Pupils equal, round, reactive to light.  No scleral icterus.  MOUTH: Nose/mouth/throat not examined due to masking requirements for COVID 19.   NECK: Supple. No thyromegaly. Trachea midline. No JVD.  No adenopathy. PULMONARY: Coarse breath sounds, no wheezes or other adventitious sounds noted. CARDIOVASCULAR: S1 and S2. Regular rate and rhythm.  No rubs murmurs gallops heard.  Pacemaker on left. GASTROINTESTINAL: Obese otherwise benign. MUSCULOSKELETAL: No joint deformity, no clubbing, no edema.  NEUROLOGIC: No focal deficits, speech is fluent.  No gait disturbance noted.  Awake, alert and oriented. SKIN: Intact,warm,dry. PSYCH: Mood and behavior appropriate.       Assessment & Plan:     ICD-10-CM   1. Solid nodule of lung 6 mm to 8 mm in diameter  R91.1 CT CHEST WO CONTRAST   He is due for follow-up CT scan of the chest and will obtain this This will also help [place] evaluate his bronchiectasis better  2. Asthma-COPD overlap syndrome (HCC)  J44.9    Continue current medication regimen to include Xolair  3. OSA (obstructive sleep apnea)  G47.33    He is compliant with his CPAP Some issues with humidity that we addressed during the visit He notes overall improvement with the therapy Continue CPAP  4. Chronic respiratory failure with hypoxia (HCC)  J96.11    Continue oxygen supplementation  3 L/min nasal cannula O2 Tolerates intermittent pulse with POC  5. Bronchiectasis without complication ([place])  T70.0    Reassess bronchiectasis with CT Using Acapella religiously Still with copious/tenacious secretions May need vest chest physiotherapy   6. Mucopurulent chronic bronchitis (HCC)  J41.1    Tends to get issues with mucus plugging Difficult to expectorate Has tried other airway clearance devices Vest may help  7. Pseudomonas aeruginosa infection  A49.8    On tobramycin 28 days on 28 days off nebulized   Orders Placed  This Encounter  Procedures  . CT CHEST WO CONTRAST    Standing Status:   Future    Number of Occurrences:   1    Standing Expiration Date:   09/13/2021    Scheduling Instructions:     Next available.    Order Specific Question:   Preferred imaging location?    Answer:   [place]    Order Specific Question:   Radiology Contrast Protocol - do NOT remove file path    Answer:   [email or web address]   Discussion:  Overall Clarence Dawson is doing well.  He notes increased stamina since using CPAP.  Also notes that he awakens feeling refreshed.  He is very compliant with his CPAP therapy we discussed some solutions to issues with the humidity.  It appears that his median CPAP pressure is around 14 cm H2O.  He does fluctuate between 14 to 19 cm H2O.  Would continue current therapy as I think he is benefiting from it.  We are reassessing the status of his bronchiectasis.  He may need vest physiotherapy in the future.  We will see him in follow-up he is to contact us prior to that time should any new difficulties arise.  Renold Don, MD [facility]   *This note was dictated using voice recognition software/Dragon.  Despite best efforts to proofread, errors can occur which can change the meaning.  Any change was purely unintentional.   Addendum:  Chest CT was performed 09/20/2020: Shows severe centrilobular emphysema with significant mucus plugging and atelectasis of the left lower lobe due to mucous plugging, there are areas with significant bronchial wall thickening.  There is severe mucous plugging throughout the lungs particularly on the left.  This is consistent with his severe mucopurulent bronchitis.  He has had similar prior findings in 2019 that time on the right with right middle lobe atelectasis.  Patient produces copious amounts of sputum during the day he quantitates over 30 mL, per day unusual to be close to 50 mL/day.  He also produces  mucous plugs. He has difficulty clearing secretions and mucous plugs due to the amount produced during the day.  He has tried Acapella flutter valve and postural drainage as well as nebulizers and increased pulmonary toilet.  Prior area of bronchiectasis not as evident on present CT due to atelectasis of the left lower lobe.  He would benefit from vest physiotherapy to enhance airway clearance.  He also has chronic Pseudomonas infection and would benefit from enhanced airway clearance.  Renold Don, MD [facility]

## 2020-09-14 ENCOUNTER — Telehealth: Payer: Self-pay | Admitting: Pulmonary Disease

## 2020-09-14 ENCOUNTER — Encounter: Payer: Self-pay | Admitting: Pulmonary Disease

## 2020-09-14 NOTE — Telephone Encounter (Signed)
OV note has been printed and faxed to [facility] at provided fax number.  Nothing further is needed.

## 2020-09-14 NOTE — Telephone Encounter (Signed)
Spoke to [place] with [employer], who is requesting OV note from 09/13/2020. Clarence Dawson is aware that note has not been complete yet. Once completed note will be faxed to [phone].  Dr. Patsey Berthold, please advise. Thanks

## 2020-09-17 ENCOUNTER — Ambulatory Visit: Payer: Medicare Other | Admitting: Internal Medicine

## 2020-09-17 ENCOUNTER — Other Ambulatory Visit: Payer: Self-pay

## 2020-09-17 ENCOUNTER — Encounter: Payer: Self-pay | Admitting: Internal Medicine

## 2020-09-17 VITALS — BP 158/82 | HR 82 | Ht 69.0 in | Wt 230.0 lb

## 2020-09-17 DIAGNOSIS — I495 Sick sinus syndrome: Secondary | ICD-10-CM

## 2020-09-17 DIAGNOSIS — I5032 Chronic diastolic (congestive) heart failure: Secondary | ICD-10-CM

## 2020-09-17 DIAGNOSIS — I480 Paroxysmal atrial fibrillation: Secondary | ICD-10-CM | POA: Diagnosis not present

## 2020-09-17 DIAGNOSIS — Z950 Presence of cardiac pacemaker: Secondary | ICD-10-CM | POA: Diagnosis not present

## 2020-09-17 NOTE — Progress Notes (Signed)
HPI [name] returns today for followup. He is a pleasant 74 yo man with a h/o sinus node dysfunction, s/p PPM insertion, and PAF. He had sepsis and developed recurrent atrial fib. He has not had any bleeding. On flecainide, he has maintained NSR.  Allergies  Allergen Reactions  . Food Anaphylaxis and Shortness Of Breath    TREE NUTS  . Iodinated Diagnostic Agents Hives and Shortness Of Breath    Patient states hives to throat closing. (01/15/17: patient states this reaction was "about 20 years ago" with possibly an IVP.  He now says high doses of prednisone "throw me into AFib."  He has tolerated CT arthrograms with Benadrly 44m PO one hour before injection.  JBrita Romp RN)  . Shellfish Allergy Anaphylaxis and Shortness Of Breath    To shellfish, crabs.  Makes him feel like "things are crawling all over" me.  Denies airway issues with these foods.  (Brita Romp RN 01/15/17)  . Goat-Derived PChild psychotherapist  . Prednisone Palpitations    PRECIPITATES A-FIB  . Metformin And Related Diarrhea    High doses at once  . Vancomycin Anxiety  . Voltaren [Diclofenac Sodium] Other (See Comments)    Feels like things are crawling on him     Current Outpatient Medications  Medication Sig Dispense Refill  . acetaminophen (TYLENOL) 500 MG tablet Take 1,000 mg by mouth 3 (three) times daily.    .Marland Kitchenalbuterol (ACCUNEB) 0.63 MG/3ML nebulizer solution Take 3 mLs by nebulization every 4 (four) hours as needed for shortness of breath.    .Marland Kitchenalbuterol (PROVENTIL HFA;VENTOLIN HFA) 108 (90 Base) MCG/ACT inhaler Inhale 2 puffs every 4 (four) hours as needed into the lungs for shortness of breath (only if you can't catch your breath).    .Marland Kitchenatorvastatin (LIPITOR) 20 MG tablet Take 20 mg by mouth at bedtime.    . bisoprolol (ZEBETA) 5 MG tablet TAKE 1 AND 1/2 TABLET BY MOUTH TWICE DAILY. 90 tablet 3  . Carboxymethylcellul-Glycerin (LUBRICATING EYE DROPS OP) Place 1 drop into the right eye  daily as needed (dry eyes).    .Marland Kitchendiltiazem (CARDIZEM CD) 120 MG 24 hr capsule TAKE ONE CAPSULE BY MOUTH ONCE DAILY. 30 capsule 0  . diphenhydramine-acetaminophen (TYLENOL PM) 25-500 MG TABS tablet Take 1 tablet by mouth at bedtime.    .Alfredia ClientPRESSAIR 400-12 MCG/ACT AEPB Take 1 puff by mouth in the morning and at bedtime. 1 each 5  . EPINEPHrine 0.3 mg/0.3 mL IJ SOAJ injection Inject 0.3 mg into the muscle as needed for anaphylaxis.     . flecainide (TAMBOCOR) 150 MG tablet TAKE 1/2 TABLET BY MOUTH TWICE DAILY. 90 tablet 0  . glipiZIDE (GLUCOTROL XL) 5 MG 24 hr tablet Take 5 mg by mouth daily before breakfast.    . hydrocortisone cream 1 % Apply 1 application topically daily as needed for itching.    .Marland KitchenJARDIANCE 10 MG TABS tablet Take 25 mg by mouth daily.     .Marland Kitchenlevothyroxine (SYNTHROID, LEVOTHROID) 137 MCG tablet Take 137 mcg by mouth daily before breakfast.     . losartan (COZAAR) 50 MG tablet Take 50 mg by mouth at bedtime.    . metFORMIN (GLUCOPHAGE-XR) 500 MG 24 hr tablet Take 1,000 mg by mouth at bedtime.     . montelukast (SINGULAIR) 10 MG tablet TAKE (1) TABLET BY MOUTH AT BEDTIME. 90 tablet 0  . Multiple Vitamin (MULTIVITAMIN) tablet Take  1 tablet by mouth daily.      Marland Kitchen omeprazole (PRILOSEC OTC) 20 MG tablet Take 20 mg by mouth daily.     Marland Kitchen QVAR REDIHALER 80 MCG/ACT inhaler INHALE 2 PUFFS TWICE DAILY. 10.6 g 5  . Respiratory Therapy Supplies (FLUTTER) DEVI Use as directed 1 each 0  . rivaroxaban (XARELTO) 20 MG TABS tablet Take 20 mg daily with supper by mouth.    . tobramycin, PF, (TOBI) 300 MG/5ML nebulizer solution Take 5 mLs (300 mg total) by nebulization in the morning and at bedtime. 280 mL 5  . XOLAIR 150 MG/ML prefilled syringe INJECT 300MG SUBCUTANEOUSLY EVERY 4 WEEKS (GIVEN AT MD  OFFICE) 4 mL 5  . furosemide (LASIX) 40 MG tablet Take 1 tablet (40 mg total) by mouth daily. 90 tablet 3   No current facility-administered medications for this visit.     Past Medical  History:  Diagnosis Date  . Allergic rhinitis   . Aortic valve disorder   . Asthma    since childhood- seasonal allergies induced  . Cancer ([place])    Skin cancer- squamous, basal  . Carotid artery stenosis   . Essential hypertension   . Full dentures   . GERD (gastroesophageal reflux disease)   . H/O hiatal hernia   . Hemorrhage of rectum   . Hyperlipidemia   . Hypothyroidism   . 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 OSA (obstructive sleep apnea)   . Osteoarthritis   . Pacemaker    Oct 2005 in [place].  Marland Kitchen PAF (paroxysmal atrial fibrillation) ([place])   . Pneumonia    "several Times" 2015 last time  . Primary localized osteoarthritis of right knee 08/11/2017  . RBBB (right bundle branch block)   . Sinoatrial node dysfunction (HCC)   . Syncope   . Tricuspid valve disorder   . Type 2 diabetes mellitus (HCC)    Type II    ROS:   All systems reviewed and negative except as noted in the HPI.   Past Surgical History:  Procedure Laterality Date  . A-V CARDIAC PACEMAKER INSERTION     Sick sinus syndrome DDR pacer  . Arthropathy Right 2005   Rebuilding of left thumb and joint   . CARDIAC CATHETERIZATION    . CARDIAC ELECTROPHYSIOLOGY STUDY AND ABLATION  09/2008   for pvcs, Dr. Loralie Champagne  . CARDIOVERSION N/A 12/18/2016   Procedure: CARDIOVERSION;  Surgeon: Pixie Casino, MD;  Location: [facility] ENDOSCOPY;  Service: Cardiovascular;  Laterality: N/A;  . [place]   right wrist  . CARPAL TUNNEL RELEASE  05/04/2012   Procedure: CARPAL TUNNEL RELEASE;  Surgeon: Wynonia Sours, MD;  Location: [place];  Service: Orthopedics;  Laterality: Left;  . CARPOMETACARPEL SUSPENSION PLASTY Right 11/16/2014   Procedure: SUSPENSIONPLASTY RIGHT THUMB TENDON TRANSFER ABDUCTOR POLLICUS LONGUS EXCISION TRAPEZIUM;  Surgeon: Daryll Brod, MD;  Location: [place];  Service: Orthopedics;  Laterality: Right;  . CHOLECYSTECTOMY  1994  . CIRCUMCISION    . COLONOSCOPY N/A  03/14/2013   Procedure: COLONOSCOPY;  Surgeon: Daneil Dolin, MD;  [facility];  Service: Endoscopy;  Laterality: N/A;  8:15 AM  . 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 EYE SURGERY  2012   Left eye Corneal transplant- partial- Cataract  . FLEXIBLE BRONCHOSCOPY Right 06/23/2019   Procedure: FLEXIBLE BRONCHOSCOPY RIGHT;  Surgeon: [name], MD;  Location: [facility] ORS;  Service: Pulmonary;  Laterality: Right;  .  GALLBLADDER SURGERY  12/01/2006  . HAND TENDON SURGERY Left late 1990's   thumb  . HEMORROIDECTOMY  2003  . INJECTION KNEE Left 08/26/2017   Procedure: LEFT KNEE INJECTION;  Surgeon: Elsie Saas, MD;  Location: [place];  Service: Orthopedics;  Laterality: Left;  . left Knee Arthroscopy     [facility]  . PARTIAL KNEE ARTHROPLASTY  11/22/2012   Procedure: UNICOMPARTMENTAL KNEE;  Surgeon: Lorn Junes, MD;  Location: [place];  Service: Orthopedics;  Laterality: Left;  left unicompartmental knee arthroplasty  . PERMANENT PACEMAKER GENERATOR CHANGE N/A 01/12/2013   Procedure: PERMANENT PACEMAKER GENERATOR CHANGE;  Surgeon: Evans Lance, MD;  Location: [facility] CATH LAB;  Service: Cardiovascular;  Laterality: N/A;  . Rotator cuff Surgery  2001   Right shoulder  . TONSILLECTOMY    . TOTAL KNEE ARTHROPLASTY Right 08/26/2017   Procedure: TOTAL KNEE ARTHROPLASTY;  Surgeon: Elsie Saas, MD;  Location: [place];  Service: Orthopedics;  Laterality: Right;  . varicose vein reduction       Family History  Problem Relation Age of Onset  . Other Father 47       Sudden Cardiac death  . Pancreatic cancer Mother   . Colon cancer Mother   . Colon cancer Maternal Aunt   . Colon polyps Neg Hx      Social History   Socioeconomic History  . Marital status: Married    Spouse name: Not on file  . Number of children: Not on file  . Years of education: Not on file  . Highest education level: Not on file  Occupational History  . Not  on file  Tobacco Use  . Smoking status: Former Smoker    Packs/day: 1.00    Years: 25.00    Pack years: 25.00    Types: Cigarettes    Start date: 02/20/1958    Quit date: 02/12/1984    Years since quitting: 36.6  . Smokeless tobacco: Former Systems developer    Types: Secondary school teacher  . Vaping Use: Never used  Substance and Sexual Activity  . Alcohol use: Yes    Alcohol/week: 4.0 standard drinks    Types: 4 Glasses of wine per week  . Drug use: No  . Sexual activity: Not on file  Other Topics Concern  . Not on file  Social History Narrative   Regular exercise: No   Social Determinants of Health   Financial Resource Strain:   . Difficulty of Paying Living Expenses: Not on file  Food Insecurity:   . Worried About Charity fundraiser in the Last Year: Not on file  . Ran Out of Food in the Last Year: Not on file  Transportation Needs:   . Lack of Transportation (Medical): Not on file  . Lack of Transportation (Non-Medical): Not on file  Physical Activity:   . Days of Exercise per Week: Not on file  . Minutes of Exercise per Session: Not on file  Stress:   . Feeling of Stress : Not on file  Social Connections:   . Frequency of Communication with Friends and Family: Not on file  . Frequency of Social Gatherings with Friends and Family: Not on file  . Attends Religious Services: Not on file  . Active Member of Clubs or Organizations: Not on file  . Attends Archivist Meetings: Not on file  . Marital Status: Not on file  Intimate Partner Violence:   . Fear of Current or  Ex-Partner: Not on file  . Emotionally Abused: Not on file  . Physically Abused: Not on file  . Sexually Abused: Not on file     BP (!) 158/82   Pulse 82   Ht _0  (1.753 m)   Wt 230 lb (104.3 kg)   SpO2 92% Comment: room air  BMI 33.97 kg/m   Physical Exam:  Well appearing NAD HEENT: Unremarkable Neck:  No JVD, no thyromegally Lymphatics:  No adenopathy Back:  No CVA tenderness Lungs:  Clear  with no wheezes HEART:  Regular rate rhythm, no murmurs, no rubs, no clicks Abd:  soft, positive bowel sounds, no organomegally, no rebound, no guarding Ext:  2 plus pulses, no edema, no cyanosis, no clubbing Skin:  No rashes no nodules Neuro:  CN II through XII intact, motor grossly intact  EKG - nsr with atrial pacing  DEVICE  Normal device function.  See PaceArt for details.   Assess/Plan: 1. PAF - he will continue flecainide. 2. Sinus node dysfunction - he is asymptomatic, s/p PPM and is dependent down to 30/min. 3. PPM - his St. Jude DDD PM is working normally. We recheck in several months. 4. 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 He will continue his current meds.   Carleene Overlie Loisann Roach,MD

## 2020-09-17 NOTE — Patient Instructions (Signed)
Medication Instructions:  Your physician recommends that you continue on your current medications as directed. Please refer to the Current Medication list given to you today.  Labwork: None ordered.  Testing/Procedures: None ordered.  Follow-Up: Your physician wants you to follow-up in: one year with Dr. Lovena Le.   You will receive a reminder letter in the mail two months in advance. If you don't receive a letter, please call our office to schedule the follow-up appointment.  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 This monitoring reduces the number of office visits required to check your device to one time per year. It allows [place] to keep an eye on the functioning of your device to ensure it is working properly. You are scheduled for a device check from home on 11/07/2020. You may send your transmission at any time that day. If you have a wireless device, the transmission will be sent automatically. After your physician reviews your transmission, you will receive a postcard with your next transmission date.  Any Other Special Instructions Will Be Listed Below (If Applicable).  If you need a refill on your cardiac medications before your next appointment, please call your pharmacy.

## 2020-09-20 ENCOUNTER — Other Ambulatory Visit: Payer: Self-pay

## 2020-09-20 ENCOUNTER — Ambulatory Visit (INDEPENDENT_AMBULATORY_CARE_PROVIDER_SITE_OTHER)
Admission: RE | Admit: 2020-09-20 | Discharge: 2020-09-20 | Disposition: A | Discharge status: Home / Self Care | Payer: Medicare Other | Source: Ambulatory Visit | Attending: Interventional Cardiology | Admitting: Interventional Cardiology

## 2020-09-20 DIAGNOSIS — R911 Solitary pulmonary nodule: Secondary | ICD-10-CM | POA: Diagnosis not present

## 2020-09-21 ENCOUNTER — Other Ambulatory Visit: Payer: Self-pay

## 2020-09-21 DIAGNOSIS — J479 Bronchiectasis, uncomplicated: Secondary | ICD-10-CM

## 2020-09-21 NOTE — Telephone Encounter (Signed)
Results have been given to patient via telephone.  Nothing further needed.

## 2020-09-24 ENCOUNTER — Telehealth: Payer: Self-pay | Admitting: Pulmonary Disease

## 2020-09-24 NOTE — Telephone Encounter (Signed)
Xolair Prefilled Syringe Order: 150mg  Prefilled Syringe:  #2 75mg  Prefilled Syringe: #n/a Ordered Date: 09/24/20 Expected date of arrival: 09/27/20 Ordered by: Sherine Cortese,LPN [facility]ty [facility]: [facility]

## 2020-09-27 NOTE — Telephone Encounter (Signed)
Xolair Prefilled Syringe Received:  150mg  Prefilled Syringe >> quantity #2, lot # [id number], exp date 09/27/2021 75mg  Prefilled Syringe >> quantity #n/a Medication arrival date: 09/27/20 Received by: Tempie Gibeault,LPN

## 2020-10-03 ENCOUNTER — Ambulatory Visit (INDEPENDENT_AMBULATORY_CARE_PROVIDER_SITE_OTHER): Payer: Medicare Other

## 2020-10-03 ENCOUNTER — Telehealth: Payer: Self-pay

## 2020-10-03 ENCOUNTER — Other Ambulatory Visit: Payer: Self-pay

## 2020-10-03 DIAGNOSIS — J4542 Moderate persistent asthma with status asthmaticus: Secondary | ICD-10-CM

## 2020-10-03 MED ORDER — OMALIZUMAB 150 MG/ML ~~LOC~~ SOSY
300.0000 mg | PREFILLED_SYRINGE | Freq: Once | SUBCUTANEOUS | Status: AC
  Administered 2020-10-03: 300 mg via SUBCUTANEOUS

## 2020-10-03 NOTE — Progress Notes (Signed)
Have you been hospitalized within the last 10 days?  No Do you have a fever?  No Do you have a cough?  No Do you have a headache or sore throat? No Do you have your Epi Pen visible and is it within date?  Yes 

## 2020-10-03 NOTE — Telephone Encounter (Signed)
Sarah with [employer] is requesting to have the CT reviewed & an addendum for the order of the CT finding need to reference bronchitises.  Please fax to updated infor to [phone].    Also, I have the form that needs to have the Box "X" Lilfetime.

## 2020-10-04 NOTE — Telephone Encounter (Signed)
Placed in folder for Dr. Darnell Level to look at.

## 2020-10-10 ENCOUNTER — Other Ambulatory Visit: Payer: Self-pay | Admitting: Internal Medicine

## 2020-10-21 ENCOUNTER — Other Ambulatory Visit: Payer: Self-pay | Admitting: Pulmonary Disease

## 2020-10-22 ENCOUNTER — Telehealth: Payer: Self-pay | Admitting: Pulmonary Disease

## 2020-10-22 NOTE — Telephone Encounter (Signed)
Xolair Prefilled Syringe Order: 150mg  Prefilled Syringe:  #2 75mg  Prefilled Syringe: #n/a Ordered Date: 10/22/20 Expected date of arrival: 10/24/20 Ordered by: Omni Dunsworth,LPN Specialty Pharmacy:[facility]

## 2020-10-24 NOTE — Telephone Encounter (Signed)
Xolair Prefilled Syringe Received:  150mg  Prefilled Syringe >> quantity #2, lot # [id number], exp date 11/27/2021 75mg  Prefilled Syringe >> quantity #n/a Medication arrival date: 10/24/20 Received by: Korrine Sicard,LPN

## 2020-10-31 ENCOUNTER — Ambulatory Visit (INDEPENDENT_AMBULATORY_CARE_PROVIDER_SITE_OTHER): Payer: Medicare Other

## 2020-10-31 ENCOUNTER — Other Ambulatory Visit: Payer: Self-pay

## 2020-10-31 DIAGNOSIS — J4542 Moderate persistent asthma with status asthmaticus: Secondary | ICD-10-CM

## 2020-10-31 MED ORDER — OMALIZUMAB 150 MG/ML ~~LOC~~ SOSY
300.0000 mg | PREFILLED_SYRINGE | Freq: Once | SUBCUTANEOUS | Status: AC
  Administered 2020-10-31: 300 mg via SUBCUTANEOUS

## 2020-10-31 NOTE — Progress Notes (Signed)
Have you been hospitalized within the last 10 days?  No Do you have a fever?  No Do you have a cough?  No Do you have a headache or sore throat? No Do you have your Epi Pen visible and is it within date?  Yes 

## 2020-11-05 ENCOUNTER — Other Ambulatory Visit: Payer: Self-pay | Admitting: *Deleted

## 2020-11-05 MED ORDER — EPINEPHRINE 0.3 MG/0.3ML IJ SOAJ: 0.3000 mg | INTRAMUSCULAR | 5 refills | Status: DC | PRN

## 2020-11-07 ENCOUNTER — Ambulatory Visit (INDEPENDENT_AMBULATORY_CARE_PROVIDER_SITE_OTHER): Payer: Medicare Other

## 2020-11-07 DIAGNOSIS — I495 Sick sinus syndrome: Secondary | ICD-10-CM

## 2020-11-08 LAB — CUP PACEART REMOTE DEVICE CHECK
Battery Remaining Longevity: 50 mo
Battery Remaining Percentage: 46 %
Battery Voltage: 2.86 V
Brady Statistic AP VP Percent: 2.2 %
Brady Statistic AP VS Percent: 98 %
Brady Statistic AS VP Percent: 1 %
Brady Statistic AS VS Percent: 1 %
Brady Statistic RA Percent Paced: 99 %
Brady Statistic RV Percent Paced: 2.2 %
Date Time Interrogation Session: 20211110030512
Implantable Lead Implant Date: 20051106
Implantable Lead Implant Date: 20051106
Implantable Lead Location: 753859
Implantable Lead Location: 753860
Implantable Pulse Generator Implant Date: 20140115
Lead Channel Impedance Value: 360 Ohm
Lead Channel Impedance Value: 640 Ohm
Lead Channel Pacing Threshold Amplitude: 0.75 V
Lead Channel Pacing Threshold Amplitude: 1 V
Lead Channel Pacing Threshold Pulse Width: 0.4 ms
Lead Channel Pacing Threshold Pulse Width: 0.4 ms
Lead Channel Sensing Intrinsic Amplitude: 1.9 mV
Lead Channel Sensing Intrinsic Amplitude: 11.7 mV
Lead Channel Setting Pacing Amplitude: 2 V
Lead Channel Setting Pacing Amplitude: 2.5 V
Lead Channel Setting Pacing Pulse Width: 0.4 ms
Lead Channel Setting Sensing Sensitivity: 2 mV
Pulse Gen Model: 2210
Pulse Gen Serial Number: 7439597

## 2020-11-09 NOTE — Progress Notes (Signed)
Remote pacemaker transmission.   

## 2020-11-12 ENCOUNTER — Other Ambulatory Visit: Payer: Self-pay | Admitting: Internal Medicine

## 2020-11-12 ENCOUNTER — Other Ambulatory Visit: Payer: Self-pay | Admitting: Pulmonary Disease

## 2020-11-29 ENCOUNTER — Other Ambulatory Visit: Payer: Self-pay

## 2020-11-29 ENCOUNTER — Ambulatory Visit (INDEPENDENT_AMBULATORY_CARE_PROVIDER_SITE_OTHER): Payer: Medicare Other

## 2020-11-29 DIAGNOSIS — J4542 Moderate persistent asthma with status asthmaticus: Secondary | ICD-10-CM | POA: Diagnosis not present

## 2020-11-29 MED ORDER — OMALIZUMAB 150 MG/ML ~~LOC~~ SOSY
300.0000 mg | PREFILLED_SYRINGE | Freq: Once | SUBCUTANEOUS | Status: AC
  Administered 2020-11-29: 300 mg via SUBCUTANEOUS

## 2020-11-29 NOTE — Progress Notes (Signed)
Have you been hospitalized within the last 10 days?  No Do you have a fever?  No Do you have a cough?  No Do you have a headache or sore throat? No Do you have your Epi Pen visible and is it within date?  Yes 

## 2020-12-12 ENCOUNTER — Telehealth: Payer: Self-pay | Admitting: Pulmonary Disease

## 2020-12-12 NOTE — Telephone Encounter (Signed)
Xolair Prefilled Syringe Order: 150mg  Prefilled Syringe:  #2 75mg  Prefilled Syringe: #n/a Ordered Date: 12/12/16 Expected date of arrival: 12/14/20 Ordered by: [place]: Lennette Bihari

## 2020-12-14 NOTE — Telephone Encounter (Signed)
Xolair Prefilled Syringe Received:  150mg  Prefilled Syringe >> quantity #2, lot # [id number], exp date 08/27/2021 75mg  Prefilled Syringe >> quantity #n/a Medication arrival date: 12/14/20 Received by: Diamonique Ruedas,LPN

## 2020-12-27 ENCOUNTER — Ambulatory Visit (INDEPENDENT_AMBULATORY_CARE_PROVIDER_SITE_OTHER): Payer: Medicare Other

## 2020-12-27 ENCOUNTER — Other Ambulatory Visit: Payer: Self-pay

## 2020-12-27 DIAGNOSIS — J4542 Moderate persistent asthma with status asthmaticus: Secondary | ICD-10-CM

## 2020-12-27 MED ORDER — OMALIZUMAB 150 MG/ML ~~LOC~~ SOSY
300.0000 mg | PREFILLED_SYRINGE | Freq: Once | SUBCUTANEOUS | Status: AC
  Administered 2020-12-27: 09:00:00 300 mg via SUBCUTANEOUS

## 2020-12-27 NOTE — Progress Notes (Signed)
Have you been hospitalized within the last 10 days?  No Do you have a fever?  No Do you have a cough?  No Do you have a headache or sore throat? No Do you have your Epi Pen visible and is it within date?  Yes 

## 2021-01-15 ENCOUNTER — Ambulatory Visit: Payer: Medicare Other | Admitting: Pulmonary Disease

## 2021-01-16 ENCOUNTER — Ambulatory Visit: Payer: Medicare Other | Admitting: Pulmonary Disease

## 2021-01-16 ENCOUNTER — Telehealth: Payer: Self-pay | Admitting: Pulmonary Disease

## 2021-01-16 ENCOUNTER — Other Ambulatory Visit: Payer: Self-pay

## 2021-01-16 ENCOUNTER — Encounter: Payer: Self-pay | Admitting: Pulmonary Disease

## 2021-01-16 VITALS — BP 134/80 | HR 91 | Temp 97.3°F | Ht 69.0 in | Wt 230.4 lb

## 2021-01-16 DIAGNOSIS — J449 Chronic obstructive pulmonary disease, unspecified: Secondary | ICD-10-CM | POA: Diagnosis not present

## 2021-01-16 DIAGNOSIS — J479 Bronchiectasis, uncomplicated: Secondary | ICD-10-CM | POA: Diagnosis not present

## 2021-01-16 DIAGNOSIS — R918 Other nonspecific abnormal finding of lung field: Secondary | ICD-10-CM

## 2021-01-16 DIAGNOSIS — J9611 Chronic respiratory failure with hypoxia: Secondary | ICD-10-CM

## 2021-01-16 DIAGNOSIS — G4733 Obstructive sleep apnea (adult) (pediatric): Secondary | ICD-10-CM

## 2021-01-16 NOTE — Telephone Encounter (Signed)
Xolair Prefilled Syringe Received:  150mg  Prefilled Syringe >> quantity #2, lot # [id number], exp date 08/28/2021 75mg  Prefilled Syringe >> quantity #n/a Medication arrival date: 01/16/21 Received by: Henriette Hesser,LPN

## 2021-01-16 NOTE — Progress Notes (Signed)
Subjective:    Patient ID: Clarence Dawson, male    DOB: 01-05-1946, 75 y.o.   MRN: [id number]  HPI Clarence Dawson is a 75 year old former smoker (quit 1985) with a history of asthma/COPD overlap syndrome, IgE mediated asthma, bronchiectasis and chronic Pseudomonas colonization and chronic respiratory failure with hypoxia who presents for a follow-up.  This is a scheduled visit.  The patient also has moderate sleep apnea and is currently on an AutoSet CPAP doing very well with this.  He is sleeping much better.  In addition he has started percussion vest physiotherapy which he states has "changed his life".  He notes that he can clear his secretions better.  He actually can take his oxygen off when doing quiet activity like reading or watching TV with no desaturations noted.  He still requires oxygen with activity.  He is maintained on Duaklir and Qvar as well as Xolair for his asthma COPD overlap syndrome.  He notes that these medications control his symptoms well.  He voices no active complaint today he feels well and looks well.  He has had issues with multiple lung nodules and these will be followed with CT.  He will be due for this prior to next visit.   Review of Systems A 10 point review of systems was performed and it is as noted above otherwise negative.  Patient Active Problem List   Diagnosis Date Noted  . Acquired thrombophilia ([place]) 01/02/2020  . Chronic diastolic heart failure ([place]) 11/22/2019  . Paroxysmal atrial fibrillation ([place]) 09/22/2019  . Atrial fibrillation with RVR ([place])   . Lobar pneumonia ([place]) 09/21/2019  . Acute respiratory failure with hypoxia ([place]) 09/21/2019  . Acute on chronic respiratory failure ([place]) 09/18/2019  . Severe sepsis ([place])   . Elevated lactic acid level   . GERD (gastroesophageal reflux disease)   . Chronic bronchitis ([place]) 02/09/2019  . CAP (community acquired pneumonia) 03/03/2018  . Primary localized osteoarthritis of right knee 08/11/2017  . Asthma  with acute exacerbation 12/08/2016  . Chest pain 10/20/2016  . HTN (hypertension) 10/20/2016  . Morbid obesity due to excess calories ([place]) 06/24/2016  . Severe persistent asthma 02/12/2016  . Upper airway cough syndrome 01/24/2016  . 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 Persistent atrial fibrillation   . Syncope   . Hyperthyroidism   . Hyperlipidemia   . RBBB (right bundle branch block)   . Tricuspid valve disorder   . Aortic valve disorder   . Carotid artery stenosis   . Type 2 diabetes mellitus ([place])   . Pacemaker   . OSA (obstructive sleep apnea)   . Arthritis   . Sinoatrial node dysfunction (HCC)   . PVC's (premature ventricular contractions) 07/17/2011  . Essential hypertension, benign 01/24/2011  . Premature ventricular contractions 01/24/2011  . PPM-St.Jude 01/24/2011   Allergies  Allergen Reactions  . Food Anaphylaxis and Shortness Of Breath    TREE NUTS  . Iodinated Diagnostic Agents Hives and Shortness Of Breath    Patient states hives to throat closing. (01/15/17: patient states this reaction was "about 20 years ago" with possibly an IVP.  He now says high doses of prednisone "throw me into AFib."  He has tolerated CT arthrograms with Benadrly 50mg  PO one hour before injection.  Brita Romp, RN)  . Shellfish Allergy Anaphylaxis and Shortness Of Breath    To shellfish, crabs.  Makes him feel like "things are crawling all over" me.  Denies airway issues with these foods.  Brita Romp,  RN 01/15/17)  . Goat-Derived Child psychotherapist   . Prednisone Palpitations    PRECIPITATES A-FIB  . Metformin And Related Diarrhea    High doses at once  . Vancomycin Anxiety  . Voltaren [Diclofenac Sodium] Other (See Comments)    Feels like things are crawling on him   Current Meds  Medication Sig  . acetaminophen (TYLENOL) 500 MG tablet Take 1,000 mg by mouth 3 (three) times daily.  Marland Kitchen albuterol (ACCUNEB) 0.63 MG/3ML nebulizer solution Take 3 mLs by nebulization every 4 (four)  hours as needed for shortness of breath.  Marland Kitchen albuterol (PROVENTIL HFA;VENTOLIN HFA) 108 (90 Base) MCG/ACT inhaler Inhale 2 puffs every 4 (four) hours as needed into the lungs for shortness of breath (only if you can't catch your breath).  Marland Kitchen atorvastatin (LIPITOR) 20 MG tablet Take 20 mg by mouth at bedtime.  . bisoprolol (ZEBETA) 5 MG tablet TAKE 1 AND 1/2 TABLET BY MOUTH TWICE DAILY.  . Carboxymethylcellul-Glycerin (LUBRICATING EYE DROPS OP) Place 1 drop into the right eye daily as needed (dry eyes).  Marland Kitchen diltiazem (CARDIZEM CD) 120 MG 24 hr capsule TAKE ONE CAPSULE BY MOUTH ONCE DAILY.  . diphenhydramine-acetaminophen (TYLENOL PM) 25-500 MG TABS tablet Take 1 tablet by mouth at bedtime.  Alfredia Client PRESSAIR 400-12 MCG/ACT AEPB INHALE 1 PUFF BY MOUTH IN THE MORNING AND AT BEDTIME  . EPINEPHrine 0.3 mg/0.3 mL IJ SOAJ injection Inject 0.3 mg into the muscle as needed for anaphylaxis.  . flecainide (TAMBOCOR) 150 MG tablet TAKE 1/2 TABLET BY MOUTH TWICE DAILY.  . furosemide (LASIX) 40 MG tablet TAKE 1 TABLET BY MOUTH DAILY  . glipiZIDE (GLUCOTROL XL) 5 MG 24 hr tablet Take 5 mg by mouth daily before breakfast.  . hydrocortisone cream 1 % Apply 1 application topically daily as needed for itching.  Marland Kitchen JARDIANCE 10 MG TABS tablet Take 25 mg by mouth daily.   Marland Kitchen levothyroxine (SYNTHROID, LEVOTHROID) 137 MCG tablet Take 137 mcg by mouth daily before breakfast.   . losartan (COZAAR) 50 MG tablet Take 50 mg by mouth at bedtime.  . metFORMIN (GLUCOPHAGE-XR) 500 MG 24 hr tablet Take 1,000 mg by mouth at bedtime.   . montelukast (SINGULAIR) 10 MG tablet TAKE (1) TABLET BY MOUTH AT BEDTIME.  . Multiple Vitamin (MULTIVITAMIN) tablet Take 1 tablet by mouth daily.  Marland Kitchen omeprazole (PRILOSEC OTC) 20 MG tablet Take 20 mg by mouth daily.   Marland Kitchen QVAR REDIHALER 80 MCG/ACT inhaler INHALE 2 PUFFS TWICE DAILY.  Marland Kitchen Respiratory Therapy Supplies (FLUTTER) DEVI Use as directed  . rivaroxaban (XARELTO) 20 MG TABS tablet Take 20 mg  daily with supper by mouth.  . tobramycin, PF, (TOBI) 300 MG/5ML nebulizer solution Take 5 mLs (300 mg total) by nebulization in the morning and at bedtime.  Arvid Right 150 MG/ML prefilled syringe INJECT 300MG  SUBCUTANEOUSLY EVERY 4 WEEKS (GIVEN AT MD  OFFICE)   Immunization History  Administered Date(s) Administered  . Fluad Quad(high Dose 65+) 09/12/2019  . Influenza Split 11/14/2015, 10/11/2018  . Influenza, High Dose Seasonal PF 09/28/2017  . Influenza-Unspecified 11/02/2016, 09/03/2020  . Moderna SARS-COV2 Booster Vaccination 08/20/2020  . Moderna Sars-Covid-2 Vaccination 02/15/2020, 03/14/2020  . Pneumococcal Conjugate-13 09/28/2015  . Tdap 11/28/2019       Objective:   Physical Exam BP 134/80 (BP Location: Left Arm, Cuff Size: Normal)   Pulse 91   Temp (!) 97.3 F (36.3 C) (Temporal)   Ht 5\' 9"  (1.753 m)   Wt  230 lb 6.4 oz (104.5 kg)   SpO2 94%   BMI 34.02 kg/m  GENERAL: Well-developed, obese patient in no acute distress. He is comfortable on nasal cannula O2. He is fully ambulatory. HEAD: Normocephalic, atraumatic.  EYES: Pupils equal, round, reactive to light. No scleral icterus.  MOUTH: Nose/mouth/throat not examined due to masking requirements for COVID 19.  NECK: Supple. No thyromegaly. Trachea midline. No JVD. No adenopathy. PULMONARY: Coarse breath sounds, no wheezes or other adventitious sounds noted.  Improved air entry bilaterally. CARDIOVASCULAR: S1 and S2. Regular rate and rhythm. No rubs murmurs gallops heard. Pacemaker on left. GASTROINTESTINAL: Obese otherwise benign. MUSCULOSKELETAL: No joint deformity, no clubbing, no edema.  NEUROLOGIC: No focal deficits, speech is fluent. No gait disturbance noted. Awake, alert and oriented. SKIN: Intact,warm,dry. PSYCH: Mood and behavior appropriate.       Assessment & Plan:     ICD-10-CM   1. Asthma-COPD overlap syndrome ([place])  J44.9    Continue Duaklir, Qvar and as needed albuterol Continue Xolair  for asthma component  2. Bronchiectasis without complication ([place])  V77.9    Doing excellently with vest physiotherapy Continue the same  3. Chronic respiratory failure with hypoxia [facility])  J96.11    May be off O2 with quiet activities such as reading or watching TV Continue O2 at other times  4. Multiple lung nodules  R91.8 CT CHEST WO CONTRAST   Revisit with CT chest  5. OSA (obstructive sleep apnea)  G47.33    Continue CPAP   Orders Placed This Encounter  Procedures  . CT CHEST WO CONTRAST    Standing Status:   Future    Standing Expiration Date:   01/16/2022    Scheduling Instructions:     02/2021    Order Specific Question:   Preferred imaging location?    Answer:   [facility]   Discussion:  Overall, since starting vest physiotherapy the patient has noted marked improvement.  Examination today air entry is markedly improved.  Continue vest physiotherapy.  All other orders as above.  We will see the patient in follow-up in 6-8 weeks time he is to contact us prior to that time should any new difficulties arise.  Renold Don, MD [facility]   *This note was dictated using voice recognition software/Dragon.  Despite best efforts to proofread, errors can occur which can change the meaning.  Any change was purely unintentional.

## 2021-01-16 NOTE — Patient Instructions (Signed)
We are going to repeat a CT scan of the chest in March  See you in follow-up after the CT is completed  Continue your current regimen with your respiratory medications, vest and Xolair shots

## 2021-01-24 ENCOUNTER — Other Ambulatory Visit: Payer: Self-pay

## 2021-01-24 ENCOUNTER — Ambulatory Visit (INDEPENDENT_AMBULATORY_CARE_PROVIDER_SITE_OTHER): Payer: Medicare Other

## 2021-01-24 DIAGNOSIS — J4542 Moderate persistent asthma with status asthmaticus: Secondary | ICD-10-CM | POA: Diagnosis not present

## 2021-01-24 MED ORDER — OMALIZUMAB 150 MG/ML ~~LOC~~ SOSY
300.0000 mg | PREFILLED_SYRINGE | Freq: Once | SUBCUTANEOUS | Status: AC
  Administered 2021-01-24: 300 mg via SUBCUTANEOUS

## 2021-01-24 NOTE — Progress Notes (Signed)
Have you been hospitalized within the last 10 days?  No Do you have a fever?  No Do you have a cough?  No Do you have a headache or sore throat? No Do you have your Epi Pen visible and is it within date?  Yes 

## 2021-02-01 ENCOUNTER — Encounter: Payer: Self-pay | Admitting: Pulmonary Disease

## 2021-02-06 ENCOUNTER — Ambulatory Visit (INDEPENDENT_AMBULATORY_CARE_PROVIDER_SITE_OTHER): Payer: Medicare Other

## 2021-02-06 DIAGNOSIS — I4510 Unspecified right bundle-branch block: Secondary | ICD-10-CM

## 2021-02-08 LAB — CUP PACEART REMOTE DEVICE CHECK
Battery Remaining Longevity: 43 mo
Battery Remaining Percentage: 40 %
Battery Voltage: 2.84 V
Brady Statistic AP VP Percent: 3.8 %
Brady Statistic AP VS Percent: 96 %
Brady Statistic AS VP Percent: 1 %
Brady Statistic AS VS Percent: 1 %
Brady Statistic RA Percent Paced: 99 %
Brady Statistic RV Percent Paced: 3.9 %
Date Time Interrogation Session: 20220209034004
Implantable Lead Implant Date: 20051106
Implantable Lead Implant Date: 20051106
Implantable Lead Location: 753859
Implantable Lead Location: 753860
Implantable Pulse Generator Implant Date: 20140115
Lead Channel Impedance Value: 380 Ohm
Lead Channel Impedance Value: 690 Ohm
Lead Channel Pacing Threshold Amplitude: 0.75 V
Lead Channel Pacing Threshold Amplitude: 1 V
Lead Channel Pacing Threshold Pulse Width: 0.4 ms
Lead Channel Pacing Threshold Pulse Width: 0.4 ms
Lead Channel Sensing Intrinsic Amplitude: 1.6 mV
Lead Channel Sensing Intrinsic Amplitude: 12 mV
Lead Channel Setting Pacing Amplitude: 2 V
Lead Channel Setting Pacing Amplitude: 2.5 V
Lead Channel Setting Pacing Pulse Width: 0.4 ms
Lead Channel Setting Sensing Sensitivity: 2 mV
Pulse Gen Model: 2210
Pulse Gen Serial Number: 7439597

## 2021-02-09 ENCOUNTER — Other Ambulatory Visit: Payer: Self-pay | Admitting: Pulmonary Disease

## 2021-02-11 ENCOUNTER — Other Ambulatory Visit: Payer: Self-pay | Admitting: Pulmonary Disease

## 2021-02-11 ENCOUNTER — Telehealth: Payer: Self-pay | Admitting: Pulmonary Disease

## 2021-02-11 NOTE — Telephone Encounter (Signed)
Xolair Prefilled Syringe Order: 150mg  Prefilled Syringe:  #2 75mg  Prefilled Syringe: #n/a Ordered Date: 02/11/21 Expected date of arrival: 02/12/21 Ordered by: [place]: [facility]

## 2021-02-12 NOTE — Telephone Encounter (Signed)
Xolair Prefilled Syringe Received:  150mg  Prefilled Syringe >> quantity #2, lot # [id number], exp date 11/27/2021 75mg  Prefilled Syringe >> quantity #n/a Medication arrival date: 02/12/21 Received by: Poet Hineman,LPN

## 2021-02-12 NOTE — Progress Notes (Signed)
Remote pacemaker transmission.   

## 2021-02-21 ENCOUNTER — Ambulatory Visit (INDEPENDENT_AMBULATORY_CARE_PROVIDER_SITE_OTHER): Payer: Medicare Other

## 2021-02-21 ENCOUNTER — Other Ambulatory Visit: Payer: Self-pay

## 2021-02-21 DIAGNOSIS — J4542 Moderate persistent asthma with status asthmaticus: Secondary | ICD-10-CM

## 2021-02-21 MED ORDER — OMALIZUMAB 150 MG/ML ~~LOC~~ SOSY: 300.0000 mg | PREFILLED_SYRINGE | SUBCUTANEOUS | 11 refills | Status: DC

## 2021-02-21 MED ORDER — OMALIZUMAB 150 MG/ML ~~LOC~~ SOSY
300.0000 mg | PREFILLED_SYRINGE | Freq: Once | SUBCUTANEOUS | Status: AC
  Administered 2021-02-21: 300 mg via SUBCUTANEOUS

## 2021-02-21 NOTE — Progress Notes (Addendum)
Have you been hospitalized within the last 10 days?  No Do you have a fever?  No Do you have a cough?  No Do you have a headache or sore throat? No Do you have your Epi Pen visible and is it within date?  Yes   Patient administered Xolair injections. Patient given ordering, specialty pharmacy, and self administration information.

## 2021-02-21 NOTE — Addendum Note (Signed)
Addended by: Elton Sin on: 02/21/2021 01:57 PM   Modules accepted: Orders

## 2021-02-27 ENCOUNTER — Other Ambulatory Visit: Payer: Self-pay

## 2021-02-27 MED ORDER — DUAKLIR PRESSAIR 400-12 MCG/ACT IN AEPB: INHALATION_SPRAY | RESPIRATORY_TRACT | 11 refills | Status: DC

## 2021-02-28 ENCOUNTER — Ambulatory Visit (HOSPITAL_COMMUNITY)
Admission: RE | Admit: 2021-02-28 | Discharge: 2021-02-28 | Disposition: A | Discharge status: Home / Self Care | Payer: Medicare Other | Source: Ambulatory Visit | Attending: Pulmonary Disease | Admitting: Pulmonary Disease

## 2021-02-28 ENCOUNTER — Other Ambulatory Visit: Payer: Self-pay

## 2021-02-28 DIAGNOSIS — R918 Other nonspecific abnormal finding of lung field: Secondary | ICD-10-CM | POA: Insufficient documentation

## 2021-03-01 ENCOUNTER — Other Ambulatory Visit: Payer: Self-pay | Admitting: Pulmonary Disease

## 2021-03-14 NOTE — Telephone Encounter (Signed)
That will be fine for him to switch to self injected form.  They can teach him at the infusion center.

## 2021-03-14 NOTE — Telephone Encounter (Signed)
Patient would like to self administer Xolair.   Dr. Patsey Berthold, please advise. Thanks

## 2021-03-14 NOTE — Telephone Encounter (Signed)
Spoke to [place] with [facility] and gave verbal to switch to self injector Xolair.

## 2021-04-02 ENCOUNTER — Encounter: Payer: Self-pay | Admitting: Pulmonary Disease

## 2021-04-02 ENCOUNTER — Other Ambulatory Visit: Payer: Self-pay

## 2021-04-02 ENCOUNTER — Ambulatory Visit: Payer: Medicare Other | Admitting: Pulmonary Disease

## 2021-04-02 VITALS — BP 130/76 | HR 82 | Temp 98.0°F | Ht 69.0 in | Wt 228.4 lb

## 2021-04-02 DIAGNOSIS — J9611 Chronic respiratory failure with hypoxia: Secondary | ICD-10-CM

## 2021-04-02 DIAGNOSIS — G4733 Obstructive sleep apnea (adult) (pediatric): Secondary | ICD-10-CM

## 2021-04-02 DIAGNOSIS — J479 Bronchiectasis, uncomplicated: Secondary | ICD-10-CM

## 2021-04-02 DIAGNOSIS — J4489 Other specified chronic obstructive pulmonary disease: Secondary | ICD-10-CM

## 2021-04-02 DIAGNOSIS — J449 Chronic obstructive pulmonary disease, unspecified: Secondary | ICD-10-CM | POA: Diagnosis not present

## 2021-04-02 DIAGNOSIS — R918 Other nonspecific abnormal finding of lung field: Secondary | ICD-10-CM | POA: Diagnosis not present

## 2021-04-02 MED ORDER — TRELEGY ELLIPTA 200-62.5-25 MCG/INH IN AEPB: 200.0000 ug | INHALATION_SPRAY | Freq: Every day | RESPIRATORY_TRACT | 0 refills | Status: DC

## 2021-04-02 NOTE — Progress Notes (Signed)
Subjective:    Patient ID: Clarence Dawson, male    DOB: 1946-10-12, 75 y.o.   MRN: [id number]  HPI Buckley is a very complex 75 year old former smoker (quit 1985) with a history of asthma/COPD overlap syndrome, asthma is IgE mediated, bronchiectasis and chronic Pseudomonas colonization.  He has chronic respiratory failure with hypoxia and is on oxygen at 2 L/min.  This is a scheduled visit.  Other issues that Jonathandavid has moderate sleep apnea he is on AutoSet CPAP doing well with this and compliant with it.  Wife presents with him today and states that he is "sleeping like a baby".  He is on vest physiotherapy which he continues to assert has "changed his life".  He can handle his secretions better.  He still requires oxygen with activity but is able to be off of oxygen while doing quiet activity.  He is maintained on Duaklir and Qvar as well as Xolair.  He notes that that Alfredia Client is now become very difficult to obtain.  Previously he had been prescribed this due to insurance issues.  He has been doing well.  Secretions well controlled.  No hemoptysis.  Shortness of breath at baseline able to do his chores around the home with supplemental oxygen without difficulty.  He feels well and looks well.  Most recent CT scan of the chest was on 28 February 2021 complete resolution of the previously noted pulmonary nodules and left lower lobe collapse.  See representative images below compare to September 2021 scan.  Review of Systems A 10 point review of systems was performed and it is as noted above otherwise negative.   Patient Active Problem List   Diagnosis Date Noted  . Acquired thrombophilia ([place]) 01/02/2020  . Chronic diastolic heart failure ([place]) 11/22/2019  . Paroxysmal atrial fibrillation ([place]) 09/22/2019  . Atrial fibrillation with RVR ([place])   . Lobar pneumonia ([place]) 09/21/2019  . Acute respiratory failure with hypoxia ([place]) 09/21/2019  . Acute on chronic respiratory failure ([place]) 09/18/2019   . Severe sepsis ([place])   . Elevated lactic acid level   . GERD (gastroesophageal reflux disease)   . Chronic bronchitis ([place]) 02/09/2019  . CAP (community acquired pneumonia) 03/03/2018  . Primary localized osteoarthritis of right knee 08/11/2017  . Asthma with acute exacerbation 12/08/2016  . Chest pain 10/20/2016  . HTN (hypertension) 10/20/2016  . Morbid obesity due to excess calories ([place]) 06/24/2016  . Severe persistent asthma 02/12/2016  . Upper airway cough syndrome 01/24/2016  . 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 Persistent atrial fibrillation   . Syncope   . Hyperthyroidism   . Hyperlipidemia   . RBBB (right bundle branch block)   . Tricuspid valve disorder   . Aortic valve disorder   . Carotid artery stenosis   . Type 2 diabetes mellitus ([place])   . Pacemaker   . OSA (obstructive sleep apnea)   . Arthritis   . Sinoatrial node dysfunction (HCC)   . PVC's (premature ventricular contractions) 07/17/2011  . Essential hypertension, benign 01/24/2011  . Premature ventricular contractions 01/24/2011  . PPM-St.Jude 01/24/2011   Allergies  Allergen Reactions  . Food Anaphylaxis and Shortness Of Breath    TREE NUTS  . Iodinated Diagnostic Agents Hives and Shortness Of Breath    Patient states hives to throat closing. (01/15/17: patient states this reaction was "about 20 years ago" with possibly an IVP.  He now says high doses of prednisone "throw me into AFib."  He has tolerated CT arthrograms  with Benadrly 50mg  PO one hour before injection.  Brita Romp, RN)  . Shellfish Allergy Anaphylaxis and Shortness Of Breath    To shellfish, crabs.  Makes him feel like "things are crawling all over" me.  Denies airway issues with these foods.  Brita Romp, RN 01/15/17)  . Goat-Derived Child psychotherapist   . Prednisone Palpitations    PRECIPITATES A-FIB  . Metformin And Related Diarrhea    High doses at once  . Vancomycin Anxiety  . Voltaren [Diclofenac Sodium] Other (See  Comments)    Feels like things are crawling on him   Current Meds  Medication Sig  . acetaminophen (TYLENOL) 500 MG tablet Take 1,000 mg by mouth 3 (three) times daily.  Marland Kitchen albuterol (ACCUNEB) 0.63 MG/3ML nebulizer solution Take 3 mLs by nebulization every 4 (four) hours as needed for shortness of breath.  Marland Kitchen albuterol (PROVENTIL HFA;VENTOLIN HFA) 108 (90 Base) MCG/ACT inhaler Inhale 2 puffs every 4 (four) hours as needed into the lungs for shortness of breath (only if you can't catch your breath).  Marland Kitchen atorvastatin (LIPITOR) 20 MG tablet Take 20 mg by mouth at bedtime.  . bisoprolol (ZEBETA) 5 MG tablet TAKE 1 AND 1/2 TABLET BY MOUTH TWICE DAILY.  . Carboxymethylcellul-Glycerin (LUBRICATING EYE DROPS OP) Place 1 drop into the right eye daily as needed (dry eyes).  Marland Kitchen diltiazem (CARDIZEM CD) 120 MG 24 hr capsule TAKE ONE CAPSULE BY MOUTH ONCE DAILY.  . diphenhydramine-acetaminophen (TYLENOL PM) 25-500 MG TABS tablet Take 1 tablet by mouth at bedtime.  Alfredia Client PRESSAIR 400-12 MCG/ACT AEPB INHALE 1 PUFF BY MOUTH IN THE MORNING AND AT BEDTIME  . EPINEPHrine 0.3 mg/0.3 mL IJ SOAJ injection Inject 0.3 mg into the muscle as needed for anaphylaxis.  . flecainide (TAMBOCOR) 150 MG tablet TAKE 1/2 TABLET BY MOUTH TWICE DAILY.  . furosemide (LASIX) 40 MG tablet TAKE 1 TABLET BY MOUTH DAILY  . glipiZIDE (GLUCOTROL XL) 5 MG 24 hr tablet Take 5 mg by mouth daily before breakfast.  . hydrocortisone cream 1 % Apply 1 application topically daily as needed for itching.  Marland Kitchen JARDIANCE 10 MG TABS tablet Take 25 mg by mouth daily.   Marland Kitchen levothyroxine (SYNTHROID, LEVOTHROID) 137 MCG tablet Take 137 mcg by mouth daily before breakfast.   . losartan (COZAAR) 50 MG tablet Take 50 mg by mouth at bedtime.  . metFORMIN (GLUCOPHAGE-XR) 500 MG 24 hr tablet Take 1,000 mg by mouth at bedtime.   . montelukast (SINGULAIR) 10 MG tablet TAKE (1) TABLET BY MOUTH AT BEDTIME.  . Multiple Vitamin (MULTIVITAMIN) tablet Take 1 tablet by  mouth daily.  Marland Kitchen omalizumab Arvid Right) 150 MG/ML prefilled syringe Inject 300 mg into the skin every 28 (twenty-eight) days.  Marland Kitchen omeprazole (PRILOSEC OTC) 20 MG tablet Take 20 mg by mouth daily.   Marland Kitchen QVAR REDIHALER 80 MCG/ACT inhaler INHALE 2 PUFFS TWICE DAILY.  Marland Kitchen Respiratory Therapy Supplies (FLUTTER) DEVI Use as directed  . rivaroxaban (XARELTO) 20 MG TABS tablet Take 20 mg daily with supper by mouth.  . tobramycin, PF, (TOBI) 300 MG/5ML nebulizer solution Take 5 mLs (300 mg total) by nebulization in the morning and at bedtime.   Immunization History  Administered Date(s) Administered  . Fluad Quad(high Dose 65+) 09/12/2019  . Influenza Split 11/14/2015, 10/11/2018  . Influenza, High Dose Seasonal PF 09/28/2017  . Influenza-Unspecified 11/02/2016, 09/03/2020  . Moderna SARS-COV2 Booster Vaccination 08/20/2020  . Moderna Sars-Covid-2 Vaccination 02/15/2020, 03/14/2020  . Pneumococcal Conjugate-13  09/28/2015  . Tdap 11/28/2019      Objective:   Physical Exam BP 130/76 (BP Location: Left Arm, Cuff Size: Normal)   Pulse 82   Temp 98 F (36.7 C) (Temporal)   Ht 5\' 9"  (1.753 m)   Wt 228 lb 6.4 oz (103.6 kg)   SpO2 91%   BMI 33.73 kg/m   GENERAL: Well-developed, obese patient in no acute distress. He is comfortable on nasal cannula O2. He is fully ambulatory. HEAD: Normocephalic, atraumatic.  EYES: Pupils equal, round, reactive to light. No scleral icterus.  MOUTH: Nose/mouth/throat not examined due to masking requirements for COVID 19.  NECK: Supple. No thyromegaly. Trachea midline. No JVD. No adenopathy. PULMONARY: Good air entry bilaterally.  Symmetrical, nonlabored, no adventitious sounds. CARDIOVASCULAR: S1 and S2. Regular rate and rhythm. No rubs murmurs gallops heard. Pacemaker on left. GASTROINTESTINAL: Obese otherwise benign. MUSCULOSKELETAL: No joint deformity, no clubbing, no edema.  NEUROLOGIC: No focal deficits, speech is fluent. No gait disturbance noted. Awake,  alert and oriented. SKIN: Intact,warm,dry. PSYCH: Mood and behavior appropriate.  Representative slice from scan performed 20 September 2020, demonstrating left lower lobe atelectasis:    Representative slices from the CT scan of the chest performed 28 February 2021 showing complete resolution of left lower lobe atelectasis on airway clearance program:       Assessment & Plan:     ICD-10-CM   1. Asthma-COPD overlap syndrome ([place])  J44.9    Well compensated Trial of Trelegy Ellipta due to changes in prescription coverage Continue as needed albuterol Continue Xolair for asthma component  2. Bronchiectasis without complication (HCC)  S50.5    Continue chest vest physiotherapy Continue TOBI in 28-day cycles  3. Chronic respiratory failure with hypoxia (HCC)  J96.11    Continue oxygen at 2 L/min Compliant  4. Multiple lung nodules  R91.8    RESOLVED with chest physiotherapy Likely represented mucous plugs  5. OSA (obstructive sleep apnea)  G47.33    Well compensated on CPAP   Meds ordered this encounter  Medications  . Fluticasone-Umeclidin-Vilant (TRELEGY ELLIPTA) 200-62.5-25 MCG/INH AEPB    Sig: Inhale 200 mcg into the lungs daily.    Dispense:  28 each    Refill:  0    Order Specific Question:   Lot Number?    Answer:   [id number]    Order Specific Question:   Expiration Date?    Answer:   09/28/2022    Order Specific Question:   Manufacturer?    Answer:   GlaxoSmithKline [[id number]]    Order Specific Question:   Quantity    Answer:   2   Discussion:  Patient is doing well from the asthma/COPD overlap syndrome standpoint.  He is benefiting from Xolair therapy and vest physiotherapy.  Bronchiectatic changes are well controlled with TOBI q. 28 days and vest physiotherapy.  His inhaler therapy has to be switched due to changes on his insurance coverage and availability of his prior medications which were Qvar and Duaklir.  We will give him a trial of Trelegy Ellipta as noted above.  We  will see the patient in follow-up in 5 to that time should any new difficulties arise.  Renold Don, MD [facility]   *This note was dictated using voice recognition software/Dragon.  Despite best efforts to proofread, errors can occur which can change the meaning.  Any change was purely unintentional.

## 2021-04-02 NOTE — Patient Instructions (Addendum)
STOP DUAKLIR AND QVAR.  We are giving you a trial of Trelegy Ellipta 200/62.5/25, 1 inhalation daily.  Make sure you rinse your mouth well after you use it.  Let us know how you are doing with it so we can call it in for you.  We will see you in follow-up in 3 months time call sooner should any new problems arise.

## 2021-04-10 ENCOUNTER — Other Ambulatory Visit: Payer: Self-pay

## 2021-04-10 ENCOUNTER — Other Ambulatory Visit: Payer: Self-pay | Admitting: Pulmonary Disease

## 2021-04-10 MED ORDER — TRELEGY ELLIPTA 200-62.5-25 MCG/INH IN AEPB: 200.0000 ug | INHALATION_SPRAY | Freq: Every day | RESPIRATORY_TRACT | 11 refills | Status: DC

## 2021-04-10 NOTE — Telephone Encounter (Signed)
I do not think that this is because of the Trelegy.  Is he keeping up with fluids during the day?  He may be dehydrated.  If he does not feel that the Trelegy helps with his breathing we can try Breztri 2 puffs twice a day.

## 2021-04-10 NOTE — Telephone Encounter (Signed)
Dr. Gonzalez, please advise. Thanks 

## 2021-04-12 ENCOUNTER — Telehealth: Payer: Self-pay

## 2021-04-12 NOTE — Telephone Encounter (Signed)
[place] Alert remote transmission for long AT/AF events - ongoing; appears a fib.  A burden 1.4%.  Ventricular rates controlled.  Known Persistent AF.  Meds: Diltiazem, Flecainide, Xarelto.    Attempted to reach pt to assess symptoms, medication compliance and request f/u transmission.  No answer, LVM with device clinic # and hours.

## 2021-04-12 NOTE — Telephone Encounter (Signed)
Pt returned phone call.  He reports he has felt very fatigued in recent weeks.  Advised it appears he went into AF ~ 4/12.   Pt confirms compliance with meds as ordered.    Pt reports he has been seen in AF clinic in the past and would like f/u with them in regards to current AF episode.

## 2021-04-15 NOTE — Telephone Encounter (Signed)
Called and spoke with patient, he is aware of appt on 04/18/21 with Adline Peals, PA-C.

## 2021-04-17 NOTE — Progress Notes (Signed)
Primary Care Physician: [name], MD Primary Electrophysiologist: Dr Lovena Le Referring Physician: Dr Carlos Levering is a 75 y.o. male with a history of SSS (s/p PPM placement with gen change in 2014), paroxysmal atrial fibrillation, chronic diastolic CHF, HTN, COPD, DM, and GERD who presents for follow up in the [facility]. Patient was recently admitted with elevated temp and AMS and was diagnosed with severe sepsis 2/2 pneumonia. During his admission, he developed afib with RVR. He was given a bolus of flecainide which converted him to atrial flutter. Patient was fairly asymptomatic. He is on Xarelto for a CHADS2VASC score of 3.   On follow up today, patient reports he started having symptoms of fatigue a little over one week ago. The device clinic received an alert for an ongoing afib episode starting 04/09/21. He did have two alcohol drinks the night before and was also being treated for an URI. COVID test was negative. He felt he was back in SR yesterday afternoon.   Today, he denies symptoms of palpitations, chest pain, shortness of breath, orthopnea, PND, lower extremity edema, dizziness, presyncope, syncope, snoring, daytime somnolence, bleeding, or neurologic sequela. The patient is tolerating medications without difficulties and is otherwise without complaint today.    Atrial Fibrillation Risk Factors:  he does not have symptoms or diagnosis of sleep apnea. he does not have a history of rheumatic fever. he does not have a history of alcohol use. The patient does not have a history of early familial atrial fibrillation or other arrhythmias.  he has a BMI of Body mass index is 34.7 kg/m.Marland Kitchen Filed Weights   04/18/21 1036  Weight: 106.6 kg    Family History  Problem Relation Age of Onset  . Other Father 49       Sudden Cardiac death  . Pancreatic cancer Mother   . Colon cancer Mother   . Colon cancer Maternal Aunt   . Colon polyps Neg Hx       Atrial Fibrillation Management history:  Previous antiarrhythmic drugs: flecainide Previous cardioversions: 11/2016 Previous ablations: none CHADS2VASC score: 3 Anticoagulation history: Xarelto   Past Medical History:  Diagnosis Date  . Allergic rhinitis   . Aortic valve disorder   . Asthma    since childhood- seasonal allergies induced  . Cancer ([place])    Skin cancer- squamous, basal  . Carotid artery stenosis   . Essential hypertension   . Full dentures   . GERD (gastroesophageal reflux disease)   . H/O hiatal hernia   . Hemorrhage of rectum   . Hyperlipidemia   . Hypothyroidism   . 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 OSA (obstructive sleep apnea)   . Osteoarthritis   . Pacemaker    Oct 2005 in [place].  Marland Kitchen PAF (paroxysmal atrial fibrillation) ([place])   . Pneumonia    "several Times" 2015 last time  . Primary localized osteoarthritis of right knee 08/11/2017  . RBBB (right bundle branch block)   . Sinoatrial node dysfunction ([facility])   . Syncope   . Tricuspid valve disorder   . Type 2 diabetes mellitus ([place])    Type II   Past Surgical History:  Procedure Laterality Date  . A-V CARDIAC PACEMAKER INSERTION     Sick sinus syndrome DDR pacer  . Arthropathy Right 2005   Rebuilding of left thumb and joint   . CARDIAC CATHETERIZATION    . CARDIAC ELECTROPHYSIOLOGY STUDY AND ABLATION  09/2008   for pvcs,  Dr. Loralie Champagne  . CARDIOVERSION N/A 12/18/2016   Procedure: CARDIOVERSION;  Surgeon: Pixie Casino, MD;  Location: [facility] ENDOSCOPY;  Service: Cardiovascular;  Laterality: N/A;  . [place]   right wrist  . CARPAL TUNNEL RELEASE  05/04/2012   Procedure: CARPAL TUNNEL RELEASE;  Surgeon: Wynonia Sours, MD;  Location: [place];  Service: Orthopedics;  Laterality: Left;  . CARPOMETACARPEL SUSPENSION PLASTY Right 11/16/2014   Procedure: SUSPENSIONPLASTY RIGHT THUMB TENDON TRANSFER ABDUCTOR POLLICUS LONGUS EXCISION TRAPEZIUM;  Surgeon: Daryll Brod, MD;   Location: [place];  Service: Orthopedics;  Laterality: Right;  . CHOLECYSTECTOMY  1994  . CIRCUMCISION    . COLONOSCOPY N/A 03/14/2013   Procedure: COLONOSCOPY;  Surgeon: Daneil Dolin, MD;  [facility];  Service: Endoscopy;  Laterality: N/A;  8:15 AM  . 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 EYE SURGERY  2012   Left eye Corneal transplant- partial- Cataract  . FLEXIBLE BRONCHOSCOPY Right 06/23/2019   Procedure: FLEXIBLE BRONCHOSCOPY RIGHT;  Surgeon: [name], MD;  Location: [facility] ORS;  Service: Pulmonary;  Laterality: Right;  . GALLBLADDER SURGERY  12/01/2006  . HAND TENDON SURGERY Left late 1990's   thumb  . HEMORROIDECTOMY  2003  . INJECTION KNEE Left 08/26/2017   Procedure: LEFT KNEE INJECTION;  Surgeon: Elsie Saas, MD;  Location: [facility];  Service: Orthopedics;  Laterality: Left;  . left Knee Arthroscopy     [facility]  . PARTIAL KNEE ARTHROPLASTY  11/22/2012   Procedure: UNICOMPARTMENTAL KNEE;  Surgeon: Lorn Junes, MD;  Location: [place];  Service: Orthopedics;  Laterality: Left;  left unicompartmental knee arthroplasty  . PERMANENT PACEMAKER GENERATOR CHANGE N/A 01/12/2013   Procedure: PERMANENT PACEMAKER GENERATOR CHANGE;  Surgeon: Evans Lance, MD;  Location: [facility] CATH LAB;  Service: Cardiovascular;  Laterality: N/A;  . Rotator cuff Surgery  2001   Right shoulder  . TONSILLECTOMY    . TOTAL KNEE ARTHROPLASTY Right 08/26/2017   Procedure: TOTAL KNEE ARTHROPLASTY;  Surgeon: Elsie Saas, MD;  Location: [place];  Service: Orthopedics;  Laterality: Right;  . varicose vein reduction      Current Outpatient Medications  Medication Sig Dispense Refill  . acetaminophen (TYLENOL) 500 MG tablet Take 1,000 mg by mouth 3 (three) times daily.    Marland Kitchen albuterol (ACCUNEB) 0.63 MG/3ML nebulizer solution Take 3 mLs by nebulization every 4 (four) hours as needed for shortness of breath.    Marland Kitchen albuterol  (PROVENTIL HFA;VENTOLIN HFA) 108 (90 Base) MCG/ACT inhaler Inhale 2 puffs every 4 (four) hours as needed into the lungs for shortness of breath (only if you can't catch your breath).    Marland Kitchen atorvastatin (LIPITOR) 20 MG tablet Take 20 mg by mouth at bedtime.    . bisoprolol (ZEBETA) 5 MG tablet TAKE 1 AND 1/2 TABLET BY MOUTH TWICE DAILY. 90 tablet 3  . Carboxymethylcellul-Glycerin (LUBRICATING EYE DROPS OP) Place 1 drop into the right eye daily as needed (dry eyes).    Marland Kitchen diltiazem (CARDIZEM CD) 120 MG 24 hr capsule TAKE ONE CAPSULE BY MOUTH ONCE DAILY. 90 capsule 3  . diphenhydramine-acetaminophen (TYLENOL PM) 25-500 MG TABS tablet Take 1 tablet by mouth at bedtime.    Marland Kitchen EPINEPHrine 0.3 mg/0.3 mL IJ SOAJ injection Inject 0.3 mg into the muscle as needed for anaphylaxis. 1 each 5  . flecainide (TAMBOCOR) 150 MG tablet TAKE 1/2 TABLET BY MOUTH TWICE DAILY.  90 tablet 2  . Fluticasone-Umeclidin-Vilant (TRELEGY ELLIPTA) 200-62.5-25 MCG/INH AEPB Inhale 200 mcg into the lungs daily. 28 each 11  . furosemide (LASIX) 40 MG tablet TAKE 1 TABLET BY MOUTH DAILY 90 tablet 3  . glipiZIDE (GLUCOTROL XL) 5 MG 24 hr tablet Take 5 mg by mouth daily before breakfast.    . hydrocortisone cream 1 % Apply 1 application topically daily as needed for itching.    Marland Kitchen JARDIANCE 25 MG TABS tablet Take 25 mg by mouth daily.    Marland Kitchen levothyroxine (SYNTHROID, LEVOTHROID) 137 MCG tablet Take 137 mcg by mouth daily before breakfast.     . losartan (COZAAR) 50 MG tablet Take 50 mg by mouth at bedtime.    . metFORMIN (GLUCOPHAGE-XR) 500 MG 24 hr tablet Take 1,000 mg by mouth at bedtime.     . montelukast (SINGULAIR) 10 MG tablet TAKE (1) TABLET BY MOUTH AT BEDTIME. 90 tablet 0  . Multiple Vitamin (MULTIVITAMIN) tablet Take 1 tablet by mouth daily.    Marland Kitchen omalizumab Arvid Right) 150 MG/ML prefilled syringe Inject 300 mg into the skin every 28 (twenty-eight) days. 2 mL 11  . omeprazole (PRILOSEC OTC) 20 MG tablet Take 20 mg by mouth daily.     Marland Kitchen  Respiratory Therapy Supplies (FLUTTER) DEVI Use as directed 1 each 0  . rivaroxaban (XARELTO) 20 MG TABS tablet Take 20 mg daily with supper by mouth.    . tobramycin, PF, (TOBI) 300 MG/5ML nebulizer solution TAKE (5)ML BY NEBULIZATION EVERY 12 HOURS. 280 mL 0   No current facility-administered medications for this encounter.    Allergies  Allergen Reactions  . Food Anaphylaxis and Shortness Of Breath    TREE NUTS  . Iodinated Diagnostic Agents Hives and Shortness Of Breath    Patient states hives to throat closing. (01/15/17: patient states this reaction was "about 20 years ago" with possibly an IVP.  He now says high doses of prednisone "throw me into AFib."  He has tolerated CT arthrograms with Benadrly 51m PO one hour before injection.  JBrita Romp RN)  . Shellfish Allergy Anaphylaxis and Shortness Of Breath    To shellfish, crabs.  Makes him feel like "things are crawling all over" me.  Denies airway issues with these foods.  (Brita Romp RN 01/15/17)  . Goat-Derived PChild psychotherapist  . Prednisone Palpitations    PRECIPITATES A-FIB  . Metformin And Related Diarrhea    High doses at once  . Vancomycin Anxiety  . Voltaren [Diclofenac Sodium] Other (See Comments)    Feels like things are crawling on him    Social History   Socioeconomic History  . Marital status: Married    Spouse name: Not on file  . Number of children: Not on file  . Years of education: Not on file  . Highest education level: Not on file  Occupational History  . Not on file  Tobacco Use  . Smoking status: Former Smoker    Packs/day: 3.00    Years: 25.00    Pack years: 75.00    Types: Cigarettes    Start date: 02/20/1958    Quit date: 02/12/1984    Years since quitting: 37.2  . Smokeless tobacco: Former [employer] developer   Types: CSecondary school teacher . Vaping Use: Never used  Substance and Sexual Activity  . Alcohol use: Yes    Alcohol/week: 2.0 - 4.0 standard drinks    Types: 1 - 2 Cans of beer,  1 - 2 Glasses of wine per week  . Drug use: No  . Sexual activity: Not on file  Other Topics Concern  . Not on file  Social History Narrative   Regular exercise: No   Social Determinants of Health   Financial Resource Strain: Not on file  Food Insecurity: Not on file  Transportation Needs: Not on file  Physical Activity: Not on file  Stress: Not on file  Social Connections: Not on file  Intimate Partner Violence: Not on file     ROS- All systems are reviewed and negative except as per the HPI above.  Physical Exam: Vitals:   04/18/21 1036  BP: 140/82  Pulse: 76  Weight: 106.6 kg  Height: _0  (1.753 m)    GEN- The patient is a well appearing obese elderly male, alert and oriented x 3 today.   HEENT-head normocephalic, atraumatic, sclera clear, conjunctiva pink, hearing intact, trachea midline. Lungs- Clear to ausculation bilaterally, normal work of breathing Heart- Regular rate and rhythm, no murmurs, rubs or gallops  GI- soft, NT, ND, + BS Extremities- no clubbing, cyanosis, or edema MS- no significant deformity or atrophy Skin- no rash or lesion Psych- euthymic mood, full affect Neuro- strength and sensation are intact   Wt Readings from Last 3 Encounters:  04/18/21 106.6 kg  04/02/21 103.6 kg  01/16/21 104.5 kg    EKG today demonstrates A paced rhythm, RBBB, LPFB Vent. rate 76 BPM PR interval 256 ms QRS duration 180 ms QT/QTcB 456/513 ms  Echo 10/21/16 demonstrated  - Left ventricle: The cavity size was normal. Wall thickness was   increased in a pattern of mild LVH. Systolic function was normal.   The estimated ejection fraction was in the range of 60% to 65%.   Wall motion was normal; there were no regional wall motion   abnormalities. Features are consistent with a pseudonormal left   ventricular filling pattern, with concomitant abnormal relaxation   and increased filling pressure (grade 2 diastolic dysfunction). - Aortic valve: Mildly to  moderately calcified annulus. Moderately   calcified leaflets. There was mild to moderate stenosis. Valve   area (VTI): 1.3 cm^2. - Mitral valve: Calcified annulus. There was trivial regurgitation. - Left atrium: The atrium was mildly to moderately dilated. - Right ventricle: Pacer wire or catheter noted in right ventricle. - Right atrium: Central venous pressure (est): 3 mm Hg. - Tricuspid valve: There was trivial regurgitation. - Pulmonary arteries: PA peak pressure: 48 mm Hg (S). - Pericardium, extracardiac: A prominent pericardial fat pad was   present.  Impressions:  - Mild LVH with LVEF 60-65%. Grade 2 diastolic dysfunction. Mild to   moderate left atrial enlargement. Mildly calcified mitral annulus   with trivial mitral regurgitation. Moderately calcified aortic   valve, leaflet structure difficult to identify. There is evidence   of mild to moderate aortic stenosis. Device wire present in the   right heart. Trivial tricuspid regurgitation with PASP 48 mmHg.  Epic records are reviewed at length today  Assessment and Plan:  1. Paroxysmal atrial fibrillation Patient spontaneously converted back to SR.  Continue flecainide 75 mg BID Continue diltiazem 120 mg daily Continue bisoprolol 7.5 mg BID Continue Xarelto 20 mg daily   This patients CHA2DS2-VASc Score and unadjusted Ischemic Stroke Rate (% per year) is equal to 3.2 % stroke rate/year from a score of 3  Above score calculated as 1 point each if present [CHF, HTN, DM, Vascular=MI/PAD/Aortic Plaque, Age if 62-74, or Male]  Above score calculated as 2 points each if present [Age > 75, or Stroke/TIA/TE]  2. Obesity Body mass index is 34.7 kg/m. Lifestyle modification was discussed and encouraged including regular physical activity and weight reduction.  3. SSS S/p PPM, followed by Dr Lovena Le and the device clinic.  4. HTN Stable, no changes today.   Follow up in the AF clinic in 6 months.    [facility] [address] [place], [place] [address] [phone] 04/18/2021 10:42 AM

## 2021-04-18 ENCOUNTER — Other Ambulatory Visit: Payer: Self-pay

## 2021-04-18 ENCOUNTER — Ambulatory Visit (HOSPITAL_COMMUNITY)
Admission: RE | Admit: 2021-04-18 | Discharge: 2021-04-18 | Disposition: A | Discharge status: Home / Self Care | Payer: Medicare Other | Source: Ambulatory Visit | Attending: Physician Assistant | Admitting: Physician Assistant

## 2021-04-18 ENCOUNTER — Encounter (HOSPITAL_COMMUNITY): Payer: Self-pay | Admitting: Physician Assistant

## 2021-04-18 VITALS — BP 140/82 | HR 76 | Ht 69.0 in | Wt 235.0 lb

## 2021-04-18 DIAGNOSIS — Z7951 Long term (current) use of inhaled steroids: Secondary | ICD-10-CM | POA: Diagnosis not present

## 2021-04-18 DIAGNOSIS — Z950 Presence of cardiac pacemaker: Secondary | ICD-10-CM | POA: Insufficient documentation

## 2021-04-18 DIAGNOSIS — Z6834 Body mass index (BMI) 34.0-34.9, adult: Secondary | ICD-10-CM | POA: Diagnosis not present

## 2021-04-18 DIAGNOSIS — J449 Chronic obstructive pulmonary disease, unspecified: Secondary | ICD-10-CM | POA: Insufficient documentation

## 2021-04-18 DIAGNOSIS — I5032 Chronic diastolic (congestive) heart failure: Secondary | ICD-10-CM | POA: Insufficient documentation

## 2021-04-18 DIAGNOSIS — Z79899 Other long term (current) drug therapy: Secondary | ICD-10-CM | POA: Diagnosis not present

## 2021-04-18 DIAGNOSIS — D6869 Other thrombophilia: Secondary | ICD-10-CM | POA: Diagnosis not present

## 2021-04-18 DIAGNOSIS — Z87891 Personal history of nicotine dependence: Secondary | ICD-10-CM | POA: Insufficient documentation

## 2021-04-18 DIAGNOSIS — I110 Hypertensive heart disease with heart failure: Secondary | ICD-10-CM | POA: Diagnosis not present

## 2021-04-18 DIAGNOSIS — I495 Sick sinus syndrome: Secondary | ICD-10-CM | POA: Insufficient documentation

## 2021-04-18 DIAGNOSIS — Z7901 Long term (current) use of anticoagulants: Secondary | ICD-10-CM | POA: Diagnosis not present

## 2021-04-18 DIAGNOSIS — I480 Paroxysmal atrial fibrillation: Secondary | ICD-10-CM

## 2021-04-18 DIAGNOSIS — I4510 Unspecified right bundle-branch block: Secondary | ICD-10-CM | POA: Diagnosis not present

## 2021-04-18 DIAGNOSIS — E669 Obesity, unspecified: Secondary | ICD-10-CM | POA: Diagnosis not present

## 2021-05-08 ENCOUNTER — Ambulatory Visit (INDEPENDENT_AMBULATORY_CARE_PROVIDER_SITE_OTHER): Payer: Medicare Other

## 2021-05-08 DIAGNOSIS — I480 Paroxysmal atrial fibrillation: Secondary | ICD-10-CM

## 2021-05-08 LAB — CUP PACEART REMOTE DEVICE CHECK
Battery Remaining Longevity: 37 mo
Battery Remaining Percentage: 35 %
Battery Voltage: 2.83 V
Brady Statistic AP VP Percent: 4.1 %
Brady Statistic AP VS Percent: 94 %
Brady Statistic AS VP Percent: 1 %
Brady Statistic AS VS Percent: 1.4 %
Brady Statistic RA Percent Paced: 97 %
Brady Statistic RV Percent Paced: 4.8 %
Date Time Interrogation Session: 20220511031041
Implantable Lead Implant Date: 20051106
Implantable Lead Implant Date: 20051106
Implantable Lead Location: 753859
Implantable Lead Location: 753860
Implantable Pulse Generator Implant Date: 20140115
Lead Channel Impedance Value: 350 Ohm
Lead Channel Impedance Value: 700 Ohm
Lead Channel Pacing Threshold Amplitude: 0.75 V
Lead Channel Pacing Threshold Amplitude: 1 V
Lead Channel Pacing Threshold Pulse Width: 0.4 ms
Lead Channel Pacing Threshold Pulse Width: 0.4 ms
Lead Channel Sensing Intrinsic Amplitude: 1.5 mV
Lead Channel Sensing Intrinsic Amplitude: 9 mV
Lead Channel Setting Pacing Amplitude: 2 V
Lead Channel Setting Pacing Amplitude: 2.5 V
Lead Channel Setting Pacing Pulse Width: 0.4 ms
Lead Channel Setting Sensing Sensitivity: 2 mV
Pulse Gen Model: 2210
Pulse Gen Serial Number: 7439597

## 2021-05-09 ENCOUNTER — Other Ambulatory Visit: Payer: Self-pay | Admitting: Pulmonary Disease

## 2021-05-09 NOTE — Telephone Encounter (Signed)
Patient is requesting refill on Singulair. This medication is not mentioned within last OV note.  Dr. Patsey Berthold, please advise. Thanks

## 2021-05-09 NOTE — Telephone Encounter (Signed)
He was supposed to get off of Singulair since he is on Xolair.  If he feels that the Singulair still helps him he can keep taking it.

## 2021-05-10 ENCOUNTER — Other Ambulatory Visit: Payer: Self-pay | Admitting: Pulmonary Disease

## 2021-05-10 NOTE — Telephone Encounter (Signed)
Lm for patient.  

## 2021-05-10 NOTE — Telephone Encounter (Signed)
Please refer to 05/10/2021 mychart message.

## 2021-05-30 NOTE — Progress Notes (Signed)
Remote pacemaker transmission.   

## 2021-06-03 ENCOUNTER — Other Ambulatory Visit: Payer: Self-pay | Admitting: Pulmonary Disease

## 2021-06-11 ENCOUNTER — Other Ambulatory Visit: Payer: Self-pay | Admitting: Pulmonary Disease

## 2021-06-25 MED ORDER — METHYLPREDNISOLONE 4 MG PO TBPK: ORAL_TABLET | ORAL | 0 refills | Status: DC

## 2021-06-25 NOTE — Telephone Encounter (Signed)
Send in  a Medrol Dosepak of 4 mg tablets.  Instructed to stop the TOBI.

## 2021-06-25 NOTE — Telephone Encounter (Signed)
Dr. Gonzalez, please advise. Thanks 

## 2021-06-25 NOTE — Telephone Encounter (Signed)
Spoke to patient via telephone and relayed below recommendations.  Medrol dosepak has been sent to preferred pharmacy.  Patient aware to stop TOBI. Nothing further needed at this time.

## 2021-07-03 NOTE — Telephone Encounter (Signed)
Routing to Dr. Gonzalez as an FYI 

## 2021-07-03 NOTE — Telephone Encounter (Signed)
Good that he is feeling better.  If he is not having fever, some of the breathing issues may be related to current heat wave as well.  Recommend limiting outdoor activities.  This may take a little time to resolve.  If he notes that he is not significantly better by Friday he should let us know.

## 2021-07-08 ENCOUNTER — Ambulatory Visit: Payer: Medicare Other | Admitting: Pulmonary Disease

## 2021-07-09 ENCOUNTER — Other Ambulatory Visit: Payer: Self-pay | Admitting: Pulmonary Disease

## 2021-07-15 ENCOUNTER — Ambulatory Visit: Payer: Medicare Other | Admitting: Pulmonary Disease

## 2021-07-15 ENCOUNTER — Encounter: Payer: Self-pay | Admitting: Pulmonary Disease

## 2021-07-15 ENCOUNTER — Other Ambulatory Visit: Payer: Self-pay

## 2021-07-15 VITALS — BP 136/78 | HR 81 | Temp 97.6°F | Ht 69.0 in | Wt 230.4 lb

## 2021-07-15 DIAGNOSIS — J9611 Chronic respiratory failure with hypoxia: Secondary | ICD-10-CM

## 2021-07-15 DIAGNOSIS — J470 Bronchiectasis with acute lower respiratory infection: Secondary | ICD-10-CM

## 2021-07-15 DIAGNOSIS — J449 Chronic obstructive pulmonary disease, unspecified: Secondary | ICD-10-CM

## 2021-07-15 MED ORDER — METHYLPREDNISOLONE 4 MG PO TBPK: ORAL_TABLET | ORAL | 0 refills | Status: DC

## 2021-07-15 MED ORDER — NUZYRA 150 MG PO TABS: 2.0000 | ORAL_TABLET | Freq: Every day | ORAL | 0 refills | Status: DC

## 2021-07-15 NOTE — Progress Notes (Signed)
Subjective:    Patient ID: Clarence Dawson, male    DOB: 10-01-1946, 75 y.o.   MRN: [id number] Chief Complaint  Patient presents with   Follow-up    Sob with exertion and wheezing.     HPI Clarence Dawson is a 75 year old former smoker (PY 36) with asthma COPD overlap syndrome bronchiectasis and chronic respiratory failure who presents for follow-up, this is a scheduled visit.  The patient was last seen on 02 April 2021.  At that time his inhalers therapy was simplified to Trelegy Ellipta 200/62.5/25.  He had been doing well with this as well as with Xolair and nebulized TOBI for chronic Pseudomonas colonization and bronchiectasis.  Around 22 June after he started a 28-day cycle with TOBI he noticed increasing issues with shortness of breath and feeling like he had congestion in his upper chest and throat.  He also felt his throat was swelling.  TOBI was discontinued and the patient was placed on a Medrol Dosepak which he tolerated well.  He noted improvement on his symptoms however he still continues to have the sensation of increased congestion in his upper chest and inability to clear secretions well.  He does use chest physiotherapy which helps him and does use Acapella flutter valve though he does admit that he has not been using the Acapella as much.  He has not had any fevers, chills or sweats.  He is up-to-date with his COVID vaccination.  COVID testing at home has been negative.  He does not endorse any other symptomatology.  He is on oxygen at 3 L/min via portable oxygen concentrator on conserving mode.   Review of Systems A 10 point review of systems was performed and it is as noted above otherwise negative.  Patient Active Problem List   Diagnosis Date Noted   Secondary hypercoagulable state ([place]) 01/02/2020   Chronic diastolic heart failure ([place]) 11/22/2019   Paroxysmal atrial fibrillation ([place]) 09/22/2019   Atrial fibrillation with RVR ([place])    Lobar pneumonia ([place]) 09/21/2019   Acute  respiratory failure with hypoxia ([place]) 09/21/2019   Acute on chronic respiratory failure ([place]) 09/18/2019   Severe sepsis (HCC)    Elevated lactic acid level    GERD (gastroesophageal reflux disease)    Chronic bronchitis ([place]) 02/09/2019   CAP (community acquired pneumonia) 03/03/2018   Primary localized osteoarthritis of right knee 08/11/2017   Asthma with acute exacerbation 12/08/2016   Chest pain 10/20/2016   HTN (hypertension) 10/20/2016   Morbid obesity due to excess calories ([place]) 06/24/2016   Severe persistent asthma 02/12/2016   Upper airway cough syndrome 01/24/2016   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00    Pack years: 75.00    Types: Cigarettes    Start date: 02/20/1958    Quit date: 02/12/1984    Years  since quitting: 37.4   Smokeless tobacco: Former    Types: Chew  Substance Use Topics   Alcohol use: Yes    Alcohol/week: 2.0 - 4.0 standard drinks    Types: 1 - 2 Cans of beer, 1 - 2 Glasses of wine per week   Allergies  Allergen Reactions   Food Anaphylaxis and Shortness Of Breath    TREE NUTS   Iodinated Diagnostic Agents Hives and Shortness Of Breath    Patient states hives to throat closing. (01/15/17: patient states this reaction was "about 20 years ago" with possibly an IVP.  He now says high doses of prednisone "throw me into AFib."  He has tolerated CT arthrograms with  Benadrly 50mg  PO one hour before injection.  Brita Romp, RN)   Shellfish Allergy Anaphylaxis and Shortness Of Breath    To shellfish, crabs.  Makes him feel like "things are crawling all over" me.  Denies airway issues with these foods.  Brita Romp, RN 01/15/17)   Goat-Derived Products Hives    GOAT CHEESE    Prednisone Palpitations    PRECIPITATES A-FIB   Metformin And Related Diarrhea    High doses at once   Vancomycin Anxiety   Voltaren [Diclofenac Sodium] Other (See Comments)    Feels like things are crawling on him   Current Meds  Medication Sig   acetaminophen (TYLENOL) 500 MG tablet Take 1,000 mg by mouth 3 (three) times daily.   albuterol (ACCUNEB) 0.63 MG/3ML nebulizer solution Take 3 mLs by nebulization every 4 (four) hours as needed for shortness of breath.   albuterol (PROVENTIL HFA;VENTOLIN HFA) 108 (90 Base) MCG/ACT inhaler Inhale 2 puffs every 4 (four) hours as needed into the lungs for shortness of breath (only if you can't catch your breath).   atorvastatin (LIPITOR) 20 MG tablet Take 20 mg by mouth at bedtime.   bisoprolol (ZEBETA) 5 MG tablet TAKE 1 AND 1/2 TABLET BY MOUTH TWICE DAILY.   Carboxymethylcellul-Glycerin (LUBRICATING EYE DROPS OP) Place 1 drop into the right eye daily as needed (dry eyes).   diltiazem (CARDIZEM CD) 120 MG 24 hr capsule TAKE ONE CAPSULE BY MOUTH ONCE DAILY.   diphenhydramine-acetaminophen (TYLENOL PM) 25-500 MG TABS tablet Take 1 tablet by mouth at bedtime.   EPINEPHrine 0.3 mg/0.3 mL IJ SOAJ injection Inject 0.3 mg into the muscle as needed for anaphylaxis.   flecainide (TAMBOCOR) 150 MG tablet TAKE 1/2 TABLET BY MOUTH TWICE DAILY.   Fluticasone-Umeclidin-Vilant (TRELEGY ELLIPTA) 200-62.5-25 MCG/INH AEPB Inhale 200 mcg into the lungs daily.   furosemide (LASIX) 40 MG tablet TAKE 1 TABLET BY MOUTH DAILY   glipiZIDE (GLUCOTROL XL) 5 MG 24 hr tablet Take 5 mg by mouth daily before breakfast.   hydrocortisone cream 1 % Apply 1 application  topically daily as needed for itching.   JARDIANCE 25 MG TABS tablet Take 25 mg by mouth daily.   levothyroxine (SYNTHROID, LEVOTHROID) 137 MCG tablet Take 137 mcg by mouth daily before breakfast.    losartan (COZAAR) 50 MG tablet Take 50 mg by mouth at bedtime.   metFORMIN (GLUCOPHAGE-XR) 500 MG 24 hr tablet Take 1,000 mg by mouth at bedtime.    methylPREDNISolone (MEDROL DOSEPAK) 4 MG TBPK tablet Take as directed in the package, this is a taper pack.   Multiple Vitamin (MULTIVITAMIN) tablet Take 1 tablet by mouth daily.   omalizumab Arvid Right) 150 MG/ML prefilled syringe Inject 300 mg into the skin every 28 (twenty-eight) days.   omeprazole (PRILOSEC OTC) 20 MG  tablet Take 20 mg by mouth daily.    Respiratory Therapy Supplies (FLUTTER) DEVI Use as directed   rivaroxaban (XARELTO) 20 MG TABS tablet Take 20 mg daily with supper by mouth.   [DISCONTINUED] methylPREDNISolone (MEDROL DOSEPAK) 4 MG TBPK tablet Use as directed   Immunization History  Administered Date(s) Administered   Fluad Quad(high Dose 65+) 09/12/2019   Influenza Split 11/14/2015, 10/11/2018   Influenza, High Dose Seasonal PF 09/28/2017   Influenza-Unspecified 11/02/2016, 09/03/2020   Moderna SARS-COV2 Booster Vaccination 08/20/2020   Moderna Sars-Covid-2 Vaccination 02/15/2020, 03/14/2020   Pneumococcal Conjugate-13 09/28/2015   Tdap 11/28/2019       Objective:   Physical Exam BP 136/78 (BP Location: Left Arm, Cuff Size: Normal)   Pulse 81   Temp 97.6 F (36.4 C) (Temporal)   Ht 5\' 9"  (1.753 m)   Wt 230 lb 6.4 oz (104.5 kg)   SpO2 93%   BMI 34.02 kg/m  GENERAL: Well-developed, obese patient in no acute distress.  He is comfortable on nasal cannula O2.  He is fully ambulatory. HEAD: Normocephalic, atraumatic. EYES: Pupils equal, round, reactive to light.  No scleral icterus. MOUTH: Nose/mouth/throat not examined due to masking requirements for COVID 19.   NECK: Supple. No thyromegaly. Trachea midline. No JVD.   No adenopathy. PULMONARY: Good air entry bilaterally.  Symmetrical, nonlabored, there are rhonchi on the upper lung zones.  CARDIOVASCULAR: S1 and S2. Regular rate and rhythm.  No rubs murmurs gallops heard.  Pacemaker on left. GASTROINTESTINAL: Obese otherwise benign. MUSCULOSKELETAL: No joint deformity, no clubbing, no edema. NEUROLOGIC: No focal deficits, speech is fluent.  No gait disturbance noted.  Awake, alert and oriented. SKIN: Intact,warm,dry. PSYCH: Mood and behavior appropriate.     Assessment & Plan:     ICD-10-CM   1. Bronchiectasis with acute lower respiratory infection ([place])  J47.0    We will treat the patient with Nuzyra 300 mg daily x10 days Repeat Medrol Dosepak Patient to call if symptoms worsen or if no improvement    2. Asthma-COPD overlap syndrome (HCC)  J44.9    Continue medications as they are    3. Chronic respiratory failure with hypoxia (HCC)  J96.11    Continue oxygen at 3 L/min with conserving device     Meds ordered this encounter  Medications   methylPREDNISolone (MEDROL DOSEPAK) 4 MG TBPK tablet    Sig: Take as directed in the package, this is a taper pack.    Dispense:  21 tablet    Refill:  0   Omadacycline Tosylate (NUZYRA) 150 MG TABS    Sig: Take 2 tablets by mouth daily.    Dispense:  7 tablet    Refill:  0    J44.9   DISCONTD: Omadacycline Tosylate (NUZYRA) 150 MG TABS    Sig: Take 150 mcg by mouth 2 (two) times daily.    Dispense:  28 tablet    Refill:  0    Order Specific Question:   Lot Number?    Answer:   [id number]    Order Specific Question:   Expiration Date?    Answer:   06/28/2023    Order Specific Question:   Quantity    Answer:   2   The patient has issues with COPD and asthma overlap.  Also has some issues with bronchiectasis.  He appears to have an exacerbation of bronchiectasis.  This was triggered by nebulizations with TOBI.  We are discontinuing TOBI.  We will treat the patient with  Nuzyra and with Medrol Dosepak.  He  has been encouraged to use his Acapella.  Continue use of vest chest physiotherapy.  We will see the patient in 2 months time he is to contact us sooner should any new difficulties arise or his symptoms fail to improve.   Renold Don, MD [facility]   *This note was dictated using voice recognition software/Dragon.  Despite best efforts to proofread, errors can occur which can change the meaning.  Any change was purely unintentional.

## 2021-07-15 NOTE — Patient Instructions (Signed)
We are giving you a new antibiotic called Elesa Hacker you will take 2 tablets daily for 10 days.  We sent in a prescription for a Medrol Dosepak.  We will see him in follow-up in 2 months time call sooner should any new problems arise.  Let us know if you do not respond to this treatment cycle.  Do not restart TOBI.

## 2021-07-16 ENCOUNTER — Telehealth: Payer: Self-pay

## 2021-07-16 ENCOUNTER — Encounter: Payer: Self-pay | Admitting: Pulmonary Disease

## 2021-07-16 MED ORDER — NUZYRA 150 MG PO TABS: 150.0000 ug | ORAL_TABLET | Freq: Two times a day (BID) | ORAL | 0 refills | Status: DC

## 2021-07-16 NOTE — Telephone Encounter (Signed)
Per Roselyn Reef with Elesa Hacker, appeal is needed.  Roselyn Reef stated to provide patient with samples.  Lm for patient.

## 2021-07-16 NOTE — Telephone Encounter (Signed)
LVM for patient in regards to [place]. Unfortunately insurance does not cover [place] and is requiring an appeal.  I will try contacted patient again in regards to this medication.

## 2021-07-16 NOTE — Telephone Encounter (Signed)
Received PA request from [employer] for Nuzyra. Dr. Patsey Berthold completed PA form and faxed back to [employer]. PA was denied.  I have spoken to the [place] rep, Roselyn Reef who stated that her pharmacist will be contacting insurance. Roselyn Reef will call back with update.

## 2021-07-17 MED ORDER — NUZYRA 150 MG PO TABS: 300.0000 mg | ORAL_TABLET | Freq: Every day | ORAL | 0 refills | Status: DC

## 2021-07-17 NOTE — Telephone Encounter (Signed)
Patient is aware of below message and voiced his understanding.  2 samples of Clarence Dawson has been placed up front for pickup. Nothing further needed.

## 2021-08-05 ENCOUNTER — Other Ambulatory Visit: Payer: Self-pay | Admitting: Internal Medicine

## 2021-08-07 ENCOUNTER — Ambulatory Visit (INDEPENDENT_AMBULATORY_CARE_PROVIDER_SITE_OTHER): Payer: Medicare Other

## 2021-08-07 DIAGNOSIS — I495 Sick sinus syndrome: Secondary | ICD-10-CM

## 2021-08-07 LAB — CUP PACEART REMOTE DEVICE CHECK
Battery Remaining Longevity: 10 mo
Battery Remaining Percentage: 9 %
Battery Voltage: 2.81 V
Brady Statistic AP VP Percent: 4.6 %
Brady Statistic AP VS Percent: 89 %
Brady Statistic AS VP Percent: 2.2 %
Brady Statistic AS VS Percent: 3.9 %
Brady Statistic RA Percent Paced: 93 %
Brady Statistic RV Percent Paced: 6.8 %
Date Time Interrogation Session: 20220810034429
Implantable Lead Implant Date: 20051106
Implantable Lead Implant Date: 20051106
Implantable Lead Location: 753859
Implantable Lead Location: 753860
Implantable Pulse Generator Implant Date: 20140115
Lead Channel Impedance Value: 350 Ohm
Lead Channel Impedance Value: 660 Ohm
Lead Channel Pacing Threshold Amplitude: 0.75 V
Lead Channel Pacing Threshold Amplitude: 1 V
Lead Channel Pacing Threshold Pulse Width: 0.4 ms
Lead Channel Pacing Threshold Pulse Width: 0.4 ms
Lead Channel Sensing Intrinsic Amplitude: 12 mV
Lead Channel Sensing Intrinsic Amplitude: 2 mV
Lead Channel Setting Pacing Amplitude: 2 V
Lead Channel Setting Pacing Amplitude: 2.5 V
Lead Channel Setting Pacing Pulse Width: 0.4 ms
Lead Channel Setting Sensing Sensitivity: 2 mV
Pulse Gen Model: 2210
Pulse Gen Serial Number: 7439597

## 2021-08-27 ENCOUNTER — Telehealth: Payer: Self-pay

## 2021-08-27 NOTE — Telephone Encounter (Signed)
Transmission received.

## 2021-08-27 NOTE — Telephone Encounter (Signed)
Patient is currently in AF with elevated VR's. Reports compliance with medications on file. Reports of GI upset yesterday, related to food. Reports today of increased fatigue and shortness of breath at times. Currently wears oxygen. Patient advised I will forward to AF clinic for review and recommendations.

## 2021-08-27 NOTE — Telephone Encounter (Signed)
I spoke with the patient and let him know the nurse needed a transmission from his monitor. He states he will go into the house and send the transmission. I told him once the nurse see the transmission, she will review it and give him a call back. The patient verbalized understanding.

## 2021-08-27 NOTE — Telephone Encounter (Signed)
Pt will increase cardizem to 120mg  BID and call with update on Friday. He has not been sleeping well since Saturday and feels weak but otherwise ok.

## 2021-08-27 NOTE — Telephone Encounter (Signed)
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 Noise reversion. Preenting rhythm AFib with rates 100-110bpm. Rate histogram shows overall rates 70-110bpm. Episodes of AFlutter with blanked flutter waves and undersensed AFib episodes, therefore onset unknown, ? 08/23/21. [place]- Xarelto, on Flecainide, Diltiazem.  Attempted to contact patient to assess and check mediation compliance. No answer, LMTCB.

## 2021-08-27 NOTE — Telephone Encounter (Signed)
  Would like patient to send another remote to check presenting.

## 2021-08-28 NOTE — Telephone Encounter (Signed)
Pt called with update today stating he was feeling slightly better. He is going to send a transmission on Thursday to review his rhythm

## 2021-08-29 NOTE — Telephone Encounter (Signed)
Presenting rhythm appears AF/ VS 70's.

## 2021-08-29 NOTE — Telephone Encounter (Signed)
Pt continues to feel poorly. He would prefer getting a treatment plan in place if he can instead of waiting to see Dr. Lovena Le. Appt made for tomorrow.

## 2021-08-29 NOTE — Progress Notes (Signed)
Remote pacemaker transmission.   

## 2021-08-30 ENCOUNTER — Other Ambulatory Visit: Payer: Self-pay

## 2021-08-30 ENCOUNTER — Ambulatory Visit (HOSPITAL_COMMUNITY)
Admission: RE | Admit: 2021-08-30 | Discharge: 2021-08-30 | Disposition: A | Discharge status: Home / Self Care | Payer: Medicare Other | Source: Ambulatory Visit | Attending: Physician Assistant | Admitting: Physician Assistant

## 2021-08-30 ENCOUNTER — Encounter (HOSPITAL_COMMUNITY): Payer: Self-pay | Admitting: Physician Assistant

## 2021-08-30 VITALS — BP 124/88 | HR 104 | Ht 69.0 in | Wt 231.4 lb

## 2021-08-30 DIAGNOSIS — Z6834 Body mass index (BMI) 34.0-34.9, adult: Secondary | ICD-10-CM | POA: Insufficient documentation

## 2021-08-30 DIAGNOSIS — I110 Hypertensive heart disease with heart failure: Secondary | ICD-10-CM | POA: Diagnosis not present

## 2021-08-30 DIAGNOSIS — E669 Obesity, unspecified: Secondary | ICD-10-CM | POA: Diagnosis not present

## 2021-08-30 DIAGNOSIS — Z7901 Long term (current) use of anticoagulants: Secondary | ICD-10-CM | POA: Insufficient documentation

## 2021-08-30 DIAGNOSIS — Z9981 Dependence on supplemental oxygen: Secondary | ICD-10-CM | POA: Insufficient documentation

## 2021-08-30 DIAGNOSIS — Z09 Encounter for follow-up examination after completed treatment for conditions other than malignant neoplasm: Secondary | ICD-10-CM | POA: Diagnosis not present

## 2021-08-30 DIAGNOSIS — D6869 Other thrombophilia: Secondary | ICD-10-CM

## 2021-08-30 DIAGNOSIS — I4892 Unspecified atrial flutter: Secondary | ICD-10-CM | POA: Insufficient documentation

## 2021-08-30 DIAGNOSIS — Z87891 Personal history of nicotine dependence: Secondary | ICD-10-CM | POA: Diagnosis not present

## 2021-08-30 DIAGNOSIS — J449 Chronic obstructive pulmonary disease, unspecified: Secondary | ICD-10-CM | POA: Insufficient documentation

## 2021-08-30 DIAGNOSIS — K219 Gastro-esophageal reflux disease without esophagitis: Secondary | ICD-10-CM | POA: Insufficient documentation

## 2021-08-30 DIAGNOSIS — I4819 Other persistent atrial fibrillation: Secondary | ICD-10-CM | POA: Diagnosis not present

## 2021-08-30 DIAGNOSIS — Z7984 Long term (current) use of oral hypoglycemic drugs: Secondary | ICD-10-CM | POA: Diagnosis not present

## 2021-08-30 DIAGNOSIS — Z79899 Other long term (current) drug therapy: Secondary | ICD-10-CM | POA: Insufficient documentation

## 2021-08-30 DIAGNOSIS — R0602 Shortness of breath: Secondary | ICD-10-CM | POA: Diagnosis present

## 2021-08-30 DIAGNOSIS — I495 Sick sinus syndrome: Secondary | ICD-10-CM | POA: Diagnosis not present

## 2021-08-30 DIAGNOSIS — R5383 Other fatigue: Secondary | ICD-10-CM | POA: Insufficient documentation

## 2021-08-30 DIAGNOSIS — I5032 Chronic diastolic (congestive) heart failure: Secondary | ICD-10-CM | POA: Diagnosis not present

## 2021-08-30 LAB — BASIC METABOLIC PANEL
Anion gap: 6 (ref 5–15)
BUN: 19 mg/dL (ref 8–23)
CO2: 29 mmol/L (ref 22–32)
Calcium: 9 mg/dL (ref 8.9–10.3)
Chloride: 104 mmol/L (ref 98–111)
Creatinine, Ser: 1.01 mg/dL (ref 0.61–1.24)
GFR, Estimated: >60 mL/min (ref >60)
Glucose, Bld: 144 mg/dL — ABNORMAL HIGH (ref 70–99)
Potassium: 4.3 mmol/L (ref 3.5–5.1)
Sodium: 139 mmol/L (ref 135–145)

## 2021-08-30 LAB — CBC
HCT: 49.9 % (ref 39.0–52.0)
Hemoglobin: 16.5 g/dL (ref 13.0–17.0)
MCH: 30.4 pg (ref 26.0–34.0)
MCHC: 33.1 g/dL (ref 30.0–36.0)
MCV: 92.1 fL (ref 80.0–100.0)
Platelets: 205 10*3/uL (ref 150–400)
RBC: 5.42 MIL/uL (ref 4.22–5.81)
RDW: 13 % (ref 11.5–15.5)
WBC: 5.9 10*3/uL (ref 4.0–10.5)
nRBC: 0 % (ref 0.0–0.2)

## 2021-08-30 MED ORDER — DILTIAZEM HCL ER COATED BEADS 120 MG PO CP24: 120.0000 mg | ORAL_CAPSULE | Freq: Every day | ORAL | 1 refills | Status: DC

## 2021-08-30 NOTE — Progress Notes (Signed)
Primary Care Physician: [name], MD Primary Electrophysiologist: Dr Lovena Le Referring Physician: Dr Carlos Levering is a 75 y.o. male with a history of SSS (s/p PPM placement with gen change in 2014), paroxysmal atrial fibrillation, chronic diastolic CHF, HTN, COPD, DM, and GERD who presents for follow up in the [facility]. Patient was recently admitted with elevated temp and AMS and was diagnosed with severe sepsis 2/2 pneumonia. During his admission, he developed afib with RVR. He was given a bolus of flecainide which converted him to atrial flutter. Patient was fairly asymptomatic. He is on Xarelto for a CHADS2VASC score of 3.  The device clinic received an alert for an ongoing afib episode starting 04/09/21. He did have two alcohol drinks the night before and was also being treated for an URI. COVID test was negative. He was back in SR before follow up.  On follow up today, patient was noted to be out of rhythm by the device clinic on 08/23/21. Patient does admit he has felt more fatigued and SOB since that time. There were no specific triggers that he could identify. He denies any bleeding issues on anticoagulation.   Today, he denies symptoms of palpitations, chest pain, orthopnea, PND, lower extremity edema, dizziness, presyncope, syncope, snoring, daytime somnolence, bleeding, or neurologic sequela. The patient is tolerating medications without difficulties and is otherwise without complaint today.    Atrial Fibrillation Risk Factors:  he does not have symptoms or diagnosis of sleep apnea. he does not have a history of rheumatic fever. he does not have a history of alcohol use. The patient does not have a history of early familial atrial fibrillation or other arrhythmias.  he has a BMI of Body mass index is 34.17 kg/m.Marland Kitchen Filed Weights   08/30/21 0923  Weight: 105 kg     Family History  Problem Relation Age of Onset   Other Father 28        Sudden Cardiac death   Pancreatic cancer Mother    Colon cancer Mother    Colon cancer Maternal Aunt    Colon polyps Neg Hx      Atrial Fibrillation Management history:  Previous antiarrhythmic drugs: flecainide Previous cardioversions: 11/2016 Previous ablations: none CHADS2VASC score: 3 Anticoagulation history: Xarelto   Past Medical History:  Diagnosis Date   Allergic rhinitis    Aortic valve disorder    Asthma    since childhood- seasonal allergies induced   Cancer ([place])    Skin cancer- squamous, basal   Carotid artery stenosis    Essential hypertension    Full dentures    GERD (gastroesophageal reflux disease)    H/O hiatal hernia    Hemorrhage of rectum    Hyperlipidemia    Hypothyroidism    Male circumcision    OSA (obstructive sleep apnea)    Osteoarthritis    Pacemaker    Oct 2005 in [place].   PAF (paroxysmal atrial fibrillation) ([place])    Pneumonia    "several Times" 2015 last time   Primary localized osteoarthritis of right knee 08/11/2017   RBBB (right bundle branch block)    Sinoatrial node dysfunction (HCC)    Syncope    Tricuspid valve disorder    Type 2 diabetes mellitus ([place])    Type II   Past Surgical History:  Procedure Laterality Date   A-V CARDIAC PACEMAKER INSERTION     Sick sinus syndrome DDR pacer   Arthropathy Right 2005  Rebuilding of left thumb and joint    CARDIAC CATHETERIZATION     CARDIAC ELECTROPHYSIOLOGY STUDY AND ABLATION  09/2008   for pvcs, Dr. Loralie Champagne   CARDIOVERSION N/A 12/18/2016   Procedure: CARDIOVERSION;  Surgeon: Pixie Casino, MD;  Location: [facility] ENDOSCOPY;  Service: Cardiovascular;  Laterality: N/A;   [place]   right wrist   CARPAL TUNNEL RELEASE  05/04/2012   Procedure: CARPAL TUNNEL RELEASE;  Surgeon: Wynonia Sours, MD;  Location: [place];  Service: Orthopedics;  Laterality: Left;   CARPOMETACARPEL SUSPENSION PLASTY Right 11/16/2014   Procedure: SUSPENSIONPLASTY RIGHT  THUMB TENDON TRANSFER ABDUCTOR POLLICUS LONGUS EXCISION TRAPEZIUM;  Surgeon: Daryll Brod, MD;  Location: [facility];  Service: Orthopedics;  Laterality: Right;   CHOLECYSTECTOMY  1994   CIRCUMCISION     COLONOSCOPY N/A 03/14/2013   Procedure: COLONOSCOPY;  Surgeon: Daneil Dolin, MD;  [facility];  Service: Endoscopy;  Laterality: N/A;  8:15 AM   EYE SURGERY     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-[facility]   EYE SURGERY  2012   Left eye Corneal transplant- partial- Cataract   FLEXIBLE BRONCHOSCOPY Right 06/23/2019   Procedure: FLEXIBLE BRONCHOSCOPY RIGHT;  Surgeon: [name], MD;  Location: [facility] ORS;  Service: Pulmonary;  Laterality: Right;   GALLBLADDER SURGERY  12/01/2006   HAND TENDON SURGERY Left late 1990's   thumb   HEMORROIDECTOMY  2003   INJECTION KNEE Left 08/26/2017   Procedure: LEFT KNEE INJECTION;  Surgeon: Elsie Saas, MD;  Location: [place];  Service: Orthopedics;  Laterality: Left;   left Knee Arthroscopy     [facility]   PARTIAL KNEE ARTHROPLASTY  11/22/2012   Procedure: UNICOMPARTMENTAL KNEE;  Surgeon: Lorn Junes, MD;  Location: [place];  Service: Orthopedics;  Laterality: Left;  left unicompartmental knee arthroplasty   PERMANENT PACEMAKER GENERATOR CHANGE N/A 01/12/2013   Procedure: PERMANENT PACEMAKER GENERATOR CHANGE;  Surgeon: Evans Lance, MD;  Location: [facility] CATH LAB;  Service: Cardiovascular;  Laterality: N/A;   Rotator cuff Surgery  2001   Right shoulder   TONSILLECTOMY     TOTAL KNEE ARTHROPLASTY Right 08/26/2017   Procedure: TOTAL KNEE ARTHROPLASTY;  Surgeon: Elsie Saas, MD;  Location: [place];  Service: Orthopedics;  Laterality: Right;   varicose vein reduction      Current Outpatient Medications  Medication Sig Dispense Refill   acetaminophen (TYLENOL) 500 MG tablet Take 1,000 mg by mouth 3 (three) times daily.     albuterol (ACCUNEB) 0.63 MG/3ML nebulizer solution Take 3 mLs by nebulization  every 4 (four) hours as needed for shortness of breath.     albuterol (PROVENTIL HFA;VENTOLIN HFA) 108 (90 Base) MCG/ACT inhaler Inhale 2 puffs every 4 (four) hours as needed into the lungs for shortness of breath (only if you can't catch your breath).     atorvastatin (LIPITOR) 20 MG tablet Take 20 mg by mouth at bedtime.     bisoprolol (ZEBETA) 5 MG tablet TAKE 1 AND 1/2 TABLET BY MOUTH TWICE DAILY. 90 tablet 3   Carboxymethylcellul-Glycerin (LUBRICATING EYE DROPS OP) Place 1 drop into the right eye daily as needed (dry eyes).     diltiazem (CARDIZEM CD) 120 MG 24 hr capsule TAKE ONE CAPSULE BY MOUTH ONCE DAILY. 90 capsule 3   diphenhydramine-acetaminophen (TYLENOL PM) 25-500 MG TABS tablet Take 1 tablet by mouth at bedtime.     EPINEPHrine 0.3 mg/0.3 mL IJ SOAJ injection Inject  0.3 mg into the muscle as needed for anaphylaxis. 1 each 5   flecainide (TAMBOCOR) 150 MG tablet TAKE 1/2 TABLET BY MOUTH TWICE DAILY. 90 tablet 0   Fluticasone-Umeclidin-Vilant (TRELEGY ELLIPTA) 200-62.5-25 MCG/INH AEPB Inhale 200 mcg into the lungs daily. 28 each 11   furosemide (LASIX) 40 MG tablet TAKE 1 TABLET BY MOUTH DAILY 90 tablet 3   glipiZIDE (GLUCOTROL XL) 5 MG 24 hr tablet Take 5 mg by mouth daily before breakfast.     hydrocortisone cream 1 % Apply 1 application topically daily as needed for itching.     JARDIANCE 25 MG TABS tablet Take 25 mg by mouth daily.     levothyroxine (SYNTHROID, LEVOTHROID) 137 MCG tablet Take 137 mcg by mouth daily before breakfast.      losartan (COZAAR) 50 MG tablet Take 50 mg by mouth at bedtime.     metFORMIN (GLUCOPHAGE-XR) 500 MG 24 hr tablet Take 1,000 mg by mouth at bedtime.      Multiple Vitamin (MULTIVITAMIN) tablet Take 1 tablet by mouth daily.     Omadacycline Tosylate (NUZYRA) 150 MG TABS Take 2 tablets by mouth daily. 7 tablet 0   Omadacycline Tosylate (NUZYRA) 150 MG TABS Take 300 mg by mouth daily. 28 tablet 0   omalizumab (XOLAIR) 150 MG/ML prefilled syringe  Inject 300 mg into the skin every 28 (twenty-eight) days. 2 mL 11   omeprazole (PRILOSEC OTC) 20 MG tablet Take 20 mg by mouth daily.      Respiratory Therapy Supplies (FLUTTER) DEVI Use as directed 1 each 0   rivaroxaban (XARELTO) 20 MG TABS tablet Take 20 mg daily with supper by mouth.     No current facility-administered medications for this encounter.    Allergies  Allergen Reactions   Food Anaphylaxis and Shortness Of Breath    TREE NUTS   Iodinated Diagnostic Agents Hives and Shortness Of Breath    Patient states hives to throat closing. (01/15/17: patient states this reaction was "about 20 years ago" with possibly an IVP.  He now says high doses of prednisone "throw me into AFib."  He has tolerated CT arthrograms with Benadrly 52m PO one hour before injection.  JBrita Romp RN)   Shellfish Allergy Anaphylaxis and Shortness Of Breath    To shellfish, crabs.  Makes him feel like "things are crawling all over" me.  Denies airway issues with these foods.  (Brita Romp RN 01/15/17)   Goat-Derived Products Hives    GOAT CHEESE    Prednisone Palpitations    PRECIPITATES A-FIB   Metformin And Related Diarrhea    High doses at once   Vancomycin Anxiety   Voltaren [Diclofenac Sodium] Other (See Comments)    Feels like things are crawling on him    Social History   Socioeconomic History   Marital status: Married    Spouse name: Not on file   Number of children: Not on file   Years of education: Not on file   Highest education level: Not on file  Occupational History   Not on file  Tobacco Use   Smoking status: Former    Packs/day: 3.00    Years: 25.00    Pack years: 75.00    Types: Cigarettes    Start date: 02/20/1958    Quit date: 02/12/1984    Years since quitting: 37.5   Smokeless tobacco: Former    Types: Chew   Tobacco comments:    Former smoker 08/30/2021  Vaping Use   Vaping  Use: Never used  Substance and Sexual Activity   Alcohol use: Yes    Alcohol/week: 2.0  - 4.0 standard drinks    Types: 1 - 2 Cans of beer, 1 - 2 Glasses of wine per week   Drug use: No   Sexual activity: Not on file  Other Topics Concern   Not on file  Social History Narrative   Regular exercise: No   Social Determinants of Health   Financial Resource Strain: Not on file  Food Insecurity: Not on file  Transportation Needs: Not on file  Physical Activity: Not on file  Stress: Not on file  Social Connections: Not on file  Intimate Partner Violence: Not on file     ROS- All systems are reviewed and negative except as per the HPI above.  Physical Exam: Vitals:   08/30/21 0923  BP: 124/88  Pulse: (!) 104  Weight: 105 kg  Height: _0  (1.753 m)    GEN- The patient is a well appearing obese elderly male, alert and oriented x 3 today.   HEENT-head normocephalic, atraumatic, sclera clear, conjunctiva pink, hearing intact, trachea midline. Lungs- wheezing bilaterally, normal work of breathing Heart- irregular rate and rhythm, no murmurs, rubs or gallops  GI- soft, NT, ND, + BS Extremities- no clubbing, cyanosis, or edema MS- no significant deformity or atrophy Skin- no rash or lesion Psych- euthymic mood, full affect Neuro- strength and sensation are intact   Wt Readings from Last 3 Encounters:  08/30/21 105 kg  07/15/21 104.5 kg  04/18/21 106.6 kg    EKG today demonstrates Afib, RBBB, LPFB Vent. rate 104 BPM PR interval 232 ms QRS duration 184 ms QT/QTcB 350/460 ms  Echo 10/21/16 demonstrated  - Left ventricle: The cavity size was normal. Wall thickness was   increased in a pattern of mild LVH. Systolic function was normal.   The estimated ejection fraction was in the range of 60% to 65%.   Wall motion was normal; there were no regional wall motion   abnormalities. Features are consistent with a pseudonormal left   ventricular filling pattern, with concomitant abnormal relaxation   and increased filling pressure (grade 2 diastolic  dysfunction). - Aortic valve: Mildly to moderately calcified annulus. Moderately   calcified leaflets. There was mild to moderate stenosis. Valve   area (VTI): 1.3 cm^2. - Mitral valve: Calcified annulus. There was trivial regurgitation. - Left atrium: The atrium was mildly to moderately dilated. - Right ventricle: Pacer wire or catheter noted in right ventricle. - Right atrium: Central venous pressure (est): 3 mm Hg. - Tricuspid valve: There was trivial regurgitation. - Pulmonary arteries: PA peak pressure: 48 mm Hg (S). - Pericardium, extracardiac: A prominent pericardial fat pad was   present.   Impressions:   - Mild LVH with LVEF 60-65%. Grade 2 diastolic dysfunction. Mild to   moderate left atrial enlargement. Mildly calcified mitral annulus   with trivial mitral regurgitation. Moderately calcified aortic   valve, leaflet structure difficult to identify. There is evidence   of mild to moderate aortic stenosis. Device wire present in the   right heart. Trivial tricuspid regurgitation with PASP 48 mmHg.  Epic records are reviewed at length today  Assessment and Plan:  1. Persistent atrial fibrillation Patient in persistent symptomatic afib. Will plan for DCCV. Check bmet/cbc. Long term, may need to consider change in AAD. ? If he would be an ablation candidate on home O2. Continue flecainide 75 mg BID Continue diltiazem 120 mg BID  and then decrease back to 120 mg daily day before DCCV. Continue bisoprolol 7.5 mg BID Continue Xarelto 20 mg daily patient denies any missed doses in the last 3 weeks.    This patients CHA2DS2-VASc Score and unadjusted Ischemic Stroke Rate (% per year) is equal to 3.2 % stroke rate/year from a score of 3  Above score calculated as 1 point each if present [CHF, HTN, DM, Vascular=MI/PAD/Aortic Plaque, Age if 65-74, or Male] Above score calculated as 2 points each if present [Age > 75, or Stroke/TIA/TE]  2. Obesity Body mass index is 34.17  kg/m. Lifestyle modification was discussed and encouraged including regular physical activity and weight reduction.  3. SSS S/p PPM, followed by Dr Lovena Le and the device clinic.  4. HTN Stable, no changes today.   Follow up with Dr Lovena Le as scheduled.    [facility] [address] [place], [place] [address] [phone] 08/30/2021 9:56 AM

## 2021-08-30 NOTE — H&P (View-Only) (Signed)
Primary Care Physician: [name], MD Primary Electrophysiologist: Dr Lovena Le Referring Physician: Dr Carlos Levering is a 75 y.o. male with a history of SSS (s/p PPM placement with gen change in 2014), paroxysmal atrial fibrillation, chronic diastolic CHF, HTN, COPD, DM, and GERD who presents for follow up in the [facility]. Patient was recently admitted with elevated temp and AMS and was diagnosed with severe sepsis 2/2 pneumonia. During his admission, he developed afib with RVR. He was given a bolus of flecainide which converted him to atrial flutter. Patient was fairly asymptomatic. He is on Xarelto for a CHADS2VASC score of 3.  The device clinic received an alert for an ongoing afib episode starting 04/09/21. He did have two alcohol drinks the night before and was also being treated for an URI. COVID test was negative. He was back in SR before follow up.  On follow up today, patient was noted to be out of rhythm by the device clinic on 08/23/21. Patient does admit he has felt more fatigued and SOB since that time. There were no specific triggers that he could identify. He denies any bleeding issues on anticoagulation.   Today, he denies symptoms of palpitations, chest pain, orthopnea, PND, lower extremity edema, dizziness, presyncope, syncope, snoring, daytime somnolence, bleeding, or neurologic sequela. The patient is tolerating medications without difficulties and is otherwise without complaint today.    Atrial Fibrillation Risk Factors:  he does not have symptoms or diagnosis of sleep apnea. he does not have a history of rheumatic fever. he does not have a history of alcohol use. The patient does not have a history of early familial atrial fibrillation or other arrhythmias.  he has a BMI of Body mass index is 34.17 kg/m.Marland Kitchen Filed Weights   08/30/21 0923  Weight: 105 kg     Family History  Problem Relation Age of Onset   Other Father 25        Sudden Cardiac death   Pancreatic cancer Mother    Colon cancer Mother    Colon cancer Maternal Aunt    Colon polyps Neg Hx      Atrial Fibrillation Management history:  Previous antiarrhythmic drugs: flecainide Previous cardioversions: 11/2016 Previous ablations: none CHADS2VASC score: 3 Anticoagulation history: Xarelto   Past Medical History:  Diagnosis Date   Allergic rhinitis    Aortic valve disorder    Asthma    since childhood- seasonal allergies induced   Cancer ([place])    Skin cancer- squamous, basal   Carotid artery stenosis    Essential hypertension    Full dentures    GERD (gastroesophageal reflux disease)    H/O hiatal hernia    Hemorrhage of rectum    Hyperlipidemia    Hypothyroidism    Male circumcision    OSA (obstructive sleep apnea)    Osteoarthritis    Pacemaker    Oct 2005 [place]   PAF (paroxysmal atrial fibrillation) ([place])    Pneumonia    "several Times" 2015 last time   Primary localized osteoarthritis of right knee 08/11/2017   RBBB (right bundle branch block)    Sinoatrial node dysfunction ([facility])    Syncope    Tricuspid valve disorder    Type 2 diabetes mellitus ([place])    Type II   Past Surgical History:  Procedure Laterality Date   A-V CARDIAC PACEMAKER INSERTION     Sick sinus syndrome DDR pacer   Arthropathy Right 2005  Rebuilding of left thumb and joint    CARDIAC CATHETERIZATION     CARDIAC ELECTROPHYSIOLOGY STUDY AND ABLATION  09/2008   for pvcs, Dr. Loralie Champagne   CARDIOVERSION N/A 12/18/2016   Procedure: CARDIOVERSION;  Surgeon: Pixie Casino, MD;  Location: [facility] ENDOSCOPY;  Service: Cardiovascular;  Laterality: N/A;   [place]   right wrist   CARPAL TUNNEL RELEASE  05/04/2012   Procedure: CARPAL TUNNEL RELEASE;  Surgeon: Wynonia Sours, MD;  Location: [facility];  Service: Orthopedics;  Laterality: Left;   CARPOMETACARPEL SUSPENSION PLASTY Right 11/16/2014   Procedure: SUSPENSIONPLASTY RIGHT  THUMB TENDON TRANSFER ABDUCTOR POLLICUS LONGUS EXCISION TRAPEZIUM;  Surgeon: Daryll Brod, MD;  Location: [facility];  Service: Orthopedics;  Laterality: Right;   CHOLECYSTECTOMY  1994   CIRCUMCISION     COLONOSCOPY N/A 03/14/2013   Procedure: COLONOSCOPY;  Surgeon: Daneil Dolin, MD;  [facility];  Service: Endoscopy;  Laterality: N/A;  8:15 AM   EYE SURGERY     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-[facility]   EYE SURGERY  2012   Left eye Corneal transplant- partial- Cataract   FLEXIBLE BRONCHOSCOPY Right 06/23/2019   Procedure: FLEXIBLE BRONCHOSCOPY RIGHT;  Surgeon: [name], MD;  Location: [facility] ORS;  Service: Pulmonary;  Laterality: Right;   GALLBLADDER SURGERY  12/01/2006   HAND TENDON SURGERY Left late 1990's   thumb   HEMORROIDECTOMY  2003   INJECTION KNEE Left 08/26/2017   Procedure: LEFT KNEE INJECTION;  Surgeon: Elsie Saas, MD;  Location: [facility];  Service: Orthopedics;  Laterality: Left;   left Knee Arthroscopy     [facility]   PARTIAL KNEE ARTHROPLASTY  11/22/2012   Procedure: UNICOMPARTMENTAL KNEE;  Surgeon: Lorn Junes, MD;  Location: [place];  Service: Orthopedics;  Laterality: Left;  left unicompartmental knee arthroplasty   PERMANENT PACEMAKER GENERATOR CHANGE N/A 01/12/2013   Procedure: PERMANENT PACEMAKER GENERATOR CHANGE;  Surgeon: Evans Lance, MD;  Location: [facility] CATH LAB;  Service: Cardiovascular;  Laterality: N/A;   Rotator cuff Surgery  2001   Right shoulder   TONSILLECTOMY     TOTAL KNEE ARTHROPLASTY Right 08/26/2017   Procedure: TOTAL KNEE ARTHROPLASTY;  Surgeon: Elsie Saas, MD;  Location: [place];  Service: Orthopedics;  Laterality: Right;   varicose vein reduction      Current Outpatient Medications  Medication Sig Dispense Refill   acetaminophen (TYLENOL) 500 MG tablet Take 1,000 mg by mouth 3 (three) times daily.     albuterol (ACCUNEB) 0.63 MG/3ML nebulizer solution Take 3 mLs by nebulization  every 4 (four) hours as needed for shortness of breath.     albuterol (PROVENTIL HFA;VENTOLIN HFA) 108 (90 Base) MCG/ACT inhaler Inhale 2 puffs every 4 (four) hours as needed into the lungs for shortness of breath (only if you can't catch your breath).     atorvastatin (LIPITOR) 20 MG tablet Take 20 mg by mouth at bedtime.     bisoprolol (ZEBETA) 5 MG tablet TAKE 1 AND 1/2 TABLET BY MOUTH TWICE DAILY. 90 tablet 3   Carboxymethylcellul-Glycerin (LUBRICATING EYE DROPS OP) Place 1 drop into the right eye daily as needed (dry eyes).     diltiazem (CARDIZEM CD) 120 MG 24 hr capsule TAKE ONE CAPSULE BY MOUTH ONCE DAILY. 90 capsule 3   diphenhydramine-acetaminophen (TYLENOL PM) 25-500 MG TABS tablet Take 1 tablet by mouth at bedtime.     EPINEPHrine 0.3 mg/0.3 mL IJ SOAJ injection Inject  0.3 mg into the muscle as needed for anaphylaxis. 1 each 5   flecainide (TAMBOCOR) 150 MG tablet TAKE 1/2 TABLET BY MOUTH TWICE DAILY. 90 tablet 0   Fluticasone-Umeclidin-Vilant (TRELEGY ELLIPTA) 200-62.5-25 MCG/INH AEPB Inhale 200 mcg into the lungs daily. 28 each 11   furosemide (LASIX) 40 MG tablet TAKE 1 TABLET BY MOUTH DAILY 90 tablet 3   glipiZIDE (GLUCOTROL XL) 5 MG 24 hr tablet Take 5 mg by mouth daily before breakfast.     hydrocortisone cream 1 % Apply 1 application topically daily as needed for itching.     JARDIANCE 25 MG TABS tablet Take 25 mg by mouth daily.     levothyroxine (SYNTHROID, LEVOTHROID) 137 MCG tablet Take 137 mcg by mouth daily before breakfast.      losartan (COZAAR) 50 MG tablet Take 50 mg by mouth at bedtime.     metFORMIN (GLUCOPHAGE-XR) 500 MG 24 hr tablet Take 1,000 mg by mouth at bedtime.      Multiple Vitamin (MULTIVITAMIN) tablet Take 1 tablet by mouth daily.     Omadacycline Tosylate (NUZYRA) 150 MG TABS Take 2 tablets by mouth daily. 7 tablet 0   Omadacycline Tosylate (NUZYRA) 150 MG TABS Take 300 mg by mouth daily. 28 tablet 0   omalizumab (XOLAIR) 150 MG/ML prefilled syringe  Inject 300 mg into the skin every 28 (twenty-eight) days. 2 mL 11   omeprazole (PRILOSEC OTC) 20 MG tablet Take 20 mg by mouth daily.      Respiratory Therapy Supplies (FLUTTER) DEVI Use as directed 1 each 0   rivaroxaban (XARELTO) 20 MG TABS tablet Take 20 mg daily with supper by mouth.     No current facility-administered medications for this encounter.    Allergies  Allergen Reactions   Food Anaphylaxis and Shortness Of Breath    TREE NUTS   Iodinated Diagnostic Agents Hives and Shortness Of Breath    Patient states hives to throat closing. (01/15/17: patient states this reaction was "about 20 years ago" with possibly an IVP.  He now says high doses of prednisone "throw me into AFib."  He has tolerated CT arthrograms with Benadrly 52m PO one hour before injection.  JBrita Romp RN)   Shellfish Allergy Anaphylaxis and Shortness Of Breath    To shellfish, crabs.  Makes him feel like "things are crawling all over" me.  Denies airway issues with these foods.  (Brita Romp RN 01/15/17)   Goat-Derived Products Hives    GOAT CHEESE    Prednisone Palpitations    PRECIPITATES A-FIB   Metformin And Related Diarrhea    High doses at once   Vancomycin Anxiety   Voltaren [Diclofenac Sodium] Other (See Comments)    Feels like things are crawling on him    Social History   Socioeconomic History   Marital status: Married    Spouse name: Not on file   Number of children: Not on file   Years of education: Not on file   Highest education level: Not on file  Occupational History   Not on file  Tobacco Use   Smoking status: Former    Packs/day: 3.00    Years: 25.00    Pack years: 75.00    Types: Cigarettes    Start date: 02/20/1958    Quit date: 02/12/1984    Years since quitting: 37.5   Smokeless tobacco: Former    Types: Chew   Tobacco comments:    Former smoker 08/30/2021  Vaping Use   Vaping  Use: Never used  Substance and Sexual Activity   Alcohol use: Yes    Alcohol/week: 2.0  - 4.0 standard drinks    Types: 1 - 2 Cans of beer, 1 - 2 Glasses of wine per week   Drug use: No   Sexual activity: Not on file  Other Topics Concern   Not on file  Social History Narrative   Regular exercise: No   Social Determinants of Health   Financial Resource Strain: Not on file  Food Insecurity: Not on file  Transportation Needs: Not on file  Physical Activity: Not on file  Stress: Not on file  Social Connections: Not on file  Intimate Partner Violence: Not on file     ROS- All systems are reviewed and negative except as per the HPI above.  Physical Exam: Vitals:   08/30/21 0923  BP: 124/88  Pulse: (!) 104  Weight: 105 kg  Height: _0  (1.753 m)    GEN- The patient is a well appearing obese elderly male, alert and oriented x 3 today.   HEENT-head normocephalic, atraumatic, sclera clear, conjunctiva pink, hearing intact, trachea midline. Lungs- wheezing bilaterally, normal work of breathing Heart- irregular rate and rhythm, no murmurs, rubs or gallops  GI- soft, NT, ND, + BS Extremities- no clubbing, cyanosis, or edema MS- no significant deformity or atrophy Skin- no rash or lesion Psych- euthymic mood, full affect Neuro- strength and sensation are intact   Wt Readings from Last 3 Encounters:  08/30/21 105 kg  07/15/21 104.5 kg  04/18/21 106.6 kg    EKG today demonstrates Afib, RBBB, LPFB Vent. rate 104 BPM PR interval 232 ms QRS duration 184 ms QT/QTcB 350/460 ms  Echo 10/21/16 demonstrated  - Left ventricle: The cavity size was normal. Wall thickness was   increased in a pattern of mild LVH. Systolic function was normal.   The estimated ejection fraction was in the range of 60% to 65%.   Wall motion was normal; there were no regional wall motion   abnormalities. Features are consistent with a pseudonormal left   ventricular filling pattern, with concomitant abnormal relaxation   and increased filling pressure (grade 2 diastolic  dysfunction). - Aortic valve: Mildly to moderately calcified annulus. Moderately   calcified leaflets. There was mild to moderate stenosis. Valve   area (VTI): 1.3 cm^2. - Mitral valve: Calcified annulus. There was trivial regurgitation. - Left atrium: The atrium was mildly to moderately dilated. - Right ventricle: Pacer wire or catheter noted in right ventricle. - Right atrium: Central venous pressure (est): 3 mm Hg. - Tricuspid valve: There was trivial regurgitation. - Pulmonary arteries: PA peak pressure: 48 mm Hg (S). - Pericardium, extracardiac: A prominent pericardial fat pad was   present.   Impressions:   - Mild LVH with LVEF 60-65%. Grade 2 diastolic dysfunction. Mild to   moderate left atrial enlargement. Mildly calcified mitral annulus   with trivial mitral regurgitation. Moderately calcified aortic   valve, leaflet structure difficult to identify. There is evidence   of mild to moderate aortic stenosis. Device wire present in the   right heart. Trivial tricuspid regurgitation with PASP 48 mmHg.  Epic records are reviewed at length today  Assessment and Plan:  1. Persistent atrial fibrillation Patient in persistent symptomatic afib. Will plan for DCCV. Check bmet/cbc. Long term, may need to consider change in AAD. ? If he would be an ablation candidate on home O2. Continue flecainide 75 mg BID Continue diltiazem 120 mg BID  and then decrease back to 120 mg daily day before DCCV. Continue bisoprolol 7.5 mg BID Continue Xarelto 20 mg daily patient denies any missed doses in the last 3 weeks.    This patients CHA2DS2-VASc Score and unadjusted Ischemic Stroke Rate (% per year) is equal to 3.2 % stroke rate/year from a score of 3  Above score calculated as 1 point each if present [CHF, HTN, DM, Vascular=MI/PAD/Aortic Plaque, Age if 65-74, or Male] Above score calculated as 2 points each if present [Age > 75, or Stroke/TIA/TE]  2. Obesity Body mass index is 34.17  kg/m. Lifestyle modification was discussed and encouraged including regular physical activity and weight reduction.  3. SSS S/p PPM, followed by Dr Lovena Le and the device clinic.  4. HTN Stable, no changes today.   Follow up with Dr Lovena Le as scheduled.    [facility] [address] [place], [place] [address] [phone] 08/30/2021 9:56 AM

## 2021-08-30 NOTE — Patient Instructions (Signed)
On Wednesday, September 7th - go back to normal dosing of cardizem (diltiazem) of once a day   Cardioversion scheduled for Thursday, September 8th  - Arrive at the [facility] and go to admitting at 12PM  - Do not eat or drink anything after midnight the night prior to your procedure.  - Take all your morning medication (except diabetic medications) with a sip of water prior to arrival.  - You will not be able to drive home after your procedure.  - Do NOT miss any doses of your blood thinner - if you should miss a dose please notify our office immediately.  - If you feel as if you go back into normal rhythm prior to scheduled cardioversion, please notify our office immediately. If your procedure is canceled in the cardioversion suite you will be charged a cancellation fee.  Patients will be asked to: to mask in public and hand hygiene (no longer quarantine) in the 3 days prior to surgery, to report if any COVID-19-like illness or household contacts to COVID-19 to determine need for testing

## 2021-09-03 ENCOUNTER — Telehealth (HOSPITAL_COMMUNITY): Payer: Self-pay | Admitting: *Deleted

## 2021-09-03 NOTE — Telephone Encounter (Signed)
Patient called in stating he felt much improved over the weekend and wonders if he might be back in normal rhythm. Pt to send transmission as he is currently scheduled for cardioversion 9/8. Will await transmission results.

## 2021-09-03 NOTE — Telephone Encounter (Signed)
Adline Peals PA reviewed transmission - continues in AF. Keep cardioversion as scheduled. Pt informed.

## 2021-09-05 ENCOUNTER — Ambulatory Visit (HOSPITAL_COMMUNITY): Payer: Medicare Other | Admitting: Anesthesiology

## 2021-09-05 ENCOUNTER — Ambulatory Visit (HOSPITAL_COMMUNITY)
Admission: RE | Admit: 2021-09-05 | Discharge: 2021-09-05 | Disposition: A | Discharge status: Home / Self Care | Payer: Medicare Other | Source: Ambulatory Visit | Attending: Cardiovascular Disease | Admitting: Cardiovascular Disease

## 2021-09-05 ENCOUNTER — Encounter (HOSPITAL_COMMUNITY): Payer: Self-pay | Admitting: Cardiovascular Disease

## 2021-09-05 ENCOUNTER — Encounter (HOSPITAL_COMMUNITY)
Admission: RE | Disposition: A | Discharge status: Home / Self Care | Payer: Self-pay | Source: Ambulatory Visit | Attending: Cardiovascular Disease

## 2021-09-05 ENCOUNTER — Other Ambulatory Visit: Payer: Self-pay

## 2021-09-05 DIAGNOSIS — I5032 Chronic diastolic (congestive) heart failure: Secondary | ICD-10-CM | POA: Insufficient documentation

## 2021-09-05 DIAGNOSIS — Z91041 Radiographic dye allergy status: Secondary | ICD-10-CM | POA: Diagnosis not present

## 2021-09-05 DIAGNOSIS — I4891 Unspecified atrial fibrillation: Secondary | ICD-10-CM | POA: Diagnosis not present

## 2021-09-05 DIAGNOSIS — Z91018 Allergy to other foods: Secondary | ICD-10-CM | POA: Diagnosis not present

## 2021-09-05 DIAGNOSIS — I110 Hypertensive heart disease with heart failure: Secondary | ICD-10-CM | POA: Insufficient documentation

## 2021-09-05 DIAGNOSIS — Z6833 Body mass index (BMI) 33.0-33.9, adult: Secondary | ICD-10-CM | POA: Insufficient documentation

## 2021-09-05 DIAGNOSIS — Z87891 Personal history of nicotine dependence: Secondary | ICD-10-CM | POA: Diagnosis not present

## 2021-09-05 DIAGNOSIS — E669 Obesity, unspecified: Secondary | ICD-10-CM | POA: Diagnosis not present

## 2021-09-05 DIAGNOSIS — I4819 Other persistent atrial fibrillation: Secondary | ICD-10-CM | POA: Diagnosis not present

## 2021-09-05 DIAGNOSIS — Z79890 Hormone replacement therapy (postmenopausal): Secondary | ICD-10-CM | POA: Insufficient documentation

## 2021-09-05 DIAGNOSIS — Z881 Allergy status to other antibiotic agents status: Secondary | ICD-10-CM | POA: Diagnosis not present

## 2021-09-05 DIAGNOSIS — Z947 Corneal transplant status: Secondary | ICD-10-CM | POA: Insufficient documentation

## 2021-09-05 DIAGNOSIS — E1151 Type 2 diabetes mellitus with diabetic peripheral angiopathy without gangrene: Secondary | ICD-10-CM | POA: Insufficient documentation

## 2021-09-05 DIAGNOSIS — Z886 Allergy status to analgesic agent status: Secondary | ICD-10-CM | POA: Insufficient documentation

## 2021-09-05 DIAGNOSIS — Z79899 Other long term (current) drug therapy: Secondary | ICD-10-CM | POA: Diagnosis not present

## 2021-09-05 DIAGNOSIS — J449 Chronic obstructive pulmonary disease, unspecified: Secondary | ICD-10-CM | POA: Insufficient documentation

## 2021-09-05 DIAGNOSIS — Z96651 Presence of right artificial knee joint: Secondary | ICD-10-CM | POA: Insufficient documentation

## 2021-09-05 DIAGNOSIS — Z7901 Long term (current) use of anticoagulants: Secondary | ICD-10-CM | POA: Insufficient documentation

## 2021-09-05 DIAGNOSIS — Z888 Allergy status to other drugs, medicaments and biological substances status: Secondary | ICD-10-CM | POA: Diagnosis not present

## 2021-09-05 DIAGNOSIS — I495 Sick sinus syndrome: Secondary | ICD-10-CM | POA: Diagnosis not present

## 2021-09-05 DIAGNOSIS — Z950 Presence of cardiac pacemaker: Secondary | ICD-10-CM | POA: Insufficient documentation

## 2021-09-05 DIAGNOSIS — Z85828 Personal history of other malignant neoplasm of skin: Secondary | ICD-10-CM | POA: Insufficient documentation

## 2021-09-05 DIAGNOSIS — Z91013 Allergy to seafood: Secondary | ICD-10-CM | POA: Diagnosis not present

## 2021-09-05 DIAGNOSIS — Z7984 Long term (current) use of oral hypoglycemic drugs: Secondary | ICD-10-CM | POA: Diagnosis not present

## 2021-09-05 HISTORY — PX: CARDIOVERSION: SHX1299

## 2021-09-05 LAB — GLUCOSE, CAPILLARY: Glucose-Capillary: 142 mg/dL — ABNORMAL HIGH (ref 70–99)

## 2021-09-05 SURGERY — CARDIOVERSION
Anesthesia: General

## 2021-09-05 MED ORDER — SODIUM CHLORIDE 0.9 % IV SOLN: INTRAVENOUS | Status: DC | PRN

## 2021-09-05 MED ORDER — LIDOCAINE 2% (20 MG/ML) 5 ML SYRINGE
INTRAMUSCULAR | Status: DC | PRN
  Administered 2021-09-05: 60 mg via INTRAVENOUS

## 2021-09-05 MED ORDER — PROPOFOL 10 MG/ML IV BOLUS
INTRAVENOUS | Status: DC | PRN
  Administered 2021-09-05: 70 mg via INTRAVENOUS

## 2021-09-05 NOTE — CV Procedure (Signed)
   DIRECT CURRENT CARDIOVERSION  NAME:  Clarence Dawson    MRN: [id number] DOB:  24-Mar-1946    ADMIT DATE: 09/05/2021  Indication:  Symptomatic atrial fibrillation  Procedure Note:  The patient signed informed consent.  They have had had therapeutic anticoagulation with xarelto greater than 3 weeks.  Anesthesia was administered by Dr. Fransisco Beau.  Adequate airway was maintained throughout and vital followed per protocol.  They were cardioverted x 1 with 200J of biphasic synchronized energy.  They converted to NSR.  There were no apparent complications.  The patient had normal neuro status and respiratory status post procedure with vitals stable as recorded elsewhere.    The patient's [employer] pacemaker was interrogated before and after. It showed Afib before and sinus rhythm with intermittent A pacing after DCCV.   Follow up: They will continue on current medical therapy and follow up with cardiology as scheduled.  [place]ake Bells T. Audie Box, MD, [place]  [address] [place], [place] [address] [phone]  12:38 PM

## 2021-09-05 NOTE — Transfer of Care (Signed)
Immediate Anesthesia Transfer of Care Note  Patient: Clarence Dawson  Procedure(s) Performed: CARDIOVERSION  Patient Location: PACU  Anesthesia Type:General  Level of Consciousness: drowsy and patient cooperative  Airway & Oxygen Therapy: Patient Spontanous Breathing and Patient connected to nasal cannula oxygen  Post-op Assessment: Report given to RN and Post -op Vital signs reviewed and stable  Post vital signs: Reviewed and stable  Last Vitals:  Vitals Value Taken Time  BP    Temp 36.3 C 09/05/21 1239  Pulse 64 09/05/21 1240  Resp 23 09/05/21 1240  SpO2 86 % 09/05/21 1240  Vitals shown include unvalidated device data.  Last Pain:  Vitals:   09/05/21 1239  TempSrc: Temporal  PainSc:          Complications: No notable events documented.

## 2021-09-05 NOTE — Anesthesia Preprocedure Evaluation (Addendum)
Anesthesia Evaluation  Patient identified by MRN, date of birth, ID band Patient awake    Reviewed: Allergy & Precautions, NPO status , Patient's Chart, lab work & pertinent test results  History of Anesthesia Complications Negative for: history of anesthetic complications  Airway Mallampati: II  TM Distance: >3 FB Neck ROM: Full    Dental  (+) Upper Dentures, Lower Dentures   Pulmonary asthma , sleep apnea, Continuous Positive Airway Pressure Ventilation and Oxygen sleep apnea , former smoker,    Pulmonary exam normal        Cardiovascular hypertension, Pt. on medications and Pt. on home beta blockers + Peripheral Vascular Disease  + dysrhythmias Atrial Fibrillation + pacemaker + Valvular Problems/Murmurs AS  Rhythm:Irregular Rate:Normal   '17 TTE - mild LVH. EF 60% to 65%. Grade 2 diastolic dysfunction. Mild to moderate AS. Valve area (VTI): 1.3 cm^2. There was trivial MR. LA was mildly to moderately dilated. Trivial TR. PA peak pressure: 48 mm Hg (S). A prominent pericardial fat pad was present.     Neuro/Psych negative neurological ROS  negative psych ROS   GI/Hepatic Neg liver ROS, hiatal hernia, GERD  Medicated and Controlled,  Endo/Other  diabetes, Type 2, Oral Hypoglycemic AgentsHypothyroidism  Obesity   Renal/GU negative Renal ROS     Musculoskeletal  (+) Arthritis , Osteoarthritis,    Abdominal   Peds  Hematology  On xarelto     Anesthesia Other Findings   Reproductive/Obstetrics                            Anesthesia Physical Anesthesia Plan  ASA: 3  Anesthesia Plan: General   Post-op Pain Management:    Induction: Intravenous  PONV Risk Score and Plan: 2 and Treatment may vary due to age or medical condition and Propofol infusion  Airway Management Planned: Mask and Natural Airway  Additional Equipment: None  Intra-op Plan:   Post-operative Plan:   Informed  Consent: I have reviewed the patients History and Physical, chart, labs and discussed the procedure including the risks, benefits and alternatives for the proposed anesthesia with the patient or authorized representative who has indicated his/her understanding and acceptance.       Plan Discussed with: CRNA and Anesthesiologist  Anesthesia Plan Comments:        Anesthesia Quick Evaluation

## 2021-09-05 NOTE — Anesthesia Postprocedure Evaluation (Signed)
Anesthesia Post Note  Patient: Clarence Dawson  Procedure(s) Performed: CARDIOVERSION     Patient location during evaluation: PACU Anesthesia Type: General Level of consciousness: awake and alert Pain management: pain level controlled Vital Signs Assessment: post-procedure vital signs reviewed and stable Respiratory status: spontaneous breathing, nonlabored ventilation and respiratory function stable Cardiovascular status: stable and blood pressure returned to baseline Anesthetic complications: no   No notable events documented.  Last Vitals:  Vitals:   09/05/21 1251 09/05/21 1259  BP: (!) 105/58 (!) 129/59  Pulse: 74 75  Resp: 14 16  Temp:    SpO2: 91% 94%    Last Pain:  Vitals:   09/05/21 1259  TempSrc:   PainSc: 0-No pain                 Audry Pili

## 2021-09-05 NOTE — Interval H&P Note (Signed)
History and Physical Interval Note:  09/05/2021 12:13 PM  Clarence Dawson  has presented today for surgery, with the diagnosis of AFIB.  The various methods of treatment have been discussed with the patient and family. After consideration of risks, benefits and other options for treatment, the patient has consented to  Procedure(s): CARDIOVERSION (N/A) as a surgical intervention.  The patient's history has been reviewed, patient examined, no change in status, stable for surgery.  I have reviewed the patient's chart and labs.  Questions were answered to the patient's satisfaction.    On xarelto > 3 weeks. NPO. Device interrogation confirms atrial fibrillation.   [place] Bells T. Audie Box, MD, [place]  [address], [place],  [address] [phone]  12:13 PM

## 2021-09-06 ENCOUNTER — Encounter (HOSPITAL_COMMUNITY): Payer: Self-pay | Admitting: Cardiovascular Disease

## 2021-09-06 DIAGNOSIS — G4733 Obstructive sleep apnea (adult) (pediatric): Secondary | ICD-10-CM

## 2021-09-13 ENCOUNTER — Other Ambulatory Visit: Payer: Self-pay

## 2021-09-13 ENCOUNTER — Ambulatory Visit (INDEPENDENT_AMBULATORY_CARE_PROVIDER_SITE_OTHER): Payer: Medicare Other | Admitting: Internal Medicine

## 2021-09-13 VITALS — BP 142/66 | HR 78 | Ht 69.0 in | Wt 228.0 lb

## 2021-09-13 DIAGNOSIS — I495 Sick sinus syndrome: Secondary | ICD-10-CM

## 2021-09-13 NOTE — Progress Notes (Signed)
HPI [name] returns today for followup. He is a pleasant 75 yo man with a h/o sinus node dysfunction, s/p PPM insertion, and PAF. He had sepsis and developed recurrent atrial fib. He has not had any bleeding. On flecainide, he has maintained NSR.  Allergies  Allergen Reactions   Food Anaphylaxis and Shortness Of Breath    TREE NUTS   Iodinated Diagnostic Agents Hives and Shortness Of Breath    Patient states hives to throat closing. (01/15/17: patient states this reaction was "about 20 years ago" with possibly an IVP.  He now says high doses of prednisone "throw me into AFib."  He has tolerated CT arthrograms with Benadrly 48m PO one hour before injection.  JBrita Romp RN)   Shellfish Allergy Anaphylaxis and Shortness Of Breath    To shellfish, crabs.  Makes him feel like "things are crawling all over" me.  Denies airway issues with these foods.  (Brita Romp RN 01/15/17)   Goat-Derived Products Hives    GOAT CHEESE    Prednisone Palpitations    PRECIPITATES A-FIB   Metformin And Related Diarrhea    High doses at once   Vancomycin Anxiety   Voltaren [Diclofenac Sodium] Other (See Comments)    Feels like things are crawling on him     Current Outpatient Medications  Medication Sig Dispense Refill   acetaminophen (TYLENOL) 500 MG tablet Take 1,000 mg by mouth 3 (three) times daily.     albuterol (PROVENTIL HFA;VENTOLIN HFA) 108 (90 Base) MCG/ACT inhaler Inhale 2 puffs every 4 (four) hours as needed into the lungs for shortness of breath (only if you can't catch your breath).     atorvastatin (LIPITOR) 20 MG tablet Take 20 mg by mouth at bedtime.     bisoprolol (ZEBETA) 5 MG tablet TAKE 1 AND 1/2 TABLET BY MOUTH TWICE DAILY. 90 tablet 3   Carboxymethylcellul-Glycerin (LUBRICATING EYE DROPS OP) Place 1 drop into the right eye daily as needed (dry eyes).     diltiazem (CARDIZEM CD) 120 MG 24 hr capsule Take 1 capsule (120 mg total) by mouth daily. May take an extra tablet daily  for breakthrough afib (Patient taking differently: Take 120 mg by mouth in the morning and at bedtime. May take an extra tablet daily for breakthrough afib) 120 capsule 1   diphenhydramine-acetaminophen (TYLENOL PM) 25-500 MG TABS tablet Take 1 tablet by mouth at bedtime as needed (sleep/pain).     EPINEPHrine 0.3 mg/0.3 mL IJ SOAJ injection Inject 0.3 mg into the muscle as needed for anaphylaxis. 1 each 5   flecainide (TAMBOCOR) 150 MG tablet TAKE 1/2 TABLET BY MOUTH TWICE DAILY. 90 tablet 0   Fluticasone-Umeclidin-Vilant (TRELEGY ELLIPTA) 200-62.5-25 MCG/INH AEPB Inhale 200 mcg into the lungs daily. 28 each 11   furosemide (LASIX) 40 MG tablet TAKE 1 TABLET BY MOUTH DAILY 90 tablet 3   glipiZIDE (GLUCOTROL XL) 5 MG 24 hr tablet Take 5 mg by mouth daily before breakfast.     JARDIANCE 25 MG TABS tablet Take 25 mg by mouth daily before breakfast.     levothyroxine (SYNTHROID, LEVOTHROID) 137 MCG tablet Take 137 mcg by mouth daily before breakfast.      losartan (COZAAR) 50 MG tablet Take 50 mg by mouth at bedtime.     metFORMIN (GLUCOPHAGE-XR) 500 MG 24 hr tablet Take 500 mg by mouth in the morning and at bedtime.     Multiple Vitamin (MULTIVITAMIN) tablet Take 1 tablet by mouth daily.  omalizumab Arvid Right) 150 MG/ML prefilled syringe Inject 300 mg into the skin every 28 (twenty-eight) days. 2 mL 11   omeprazole (PRILOSEC OTC) 20 MG tablet Take 20 mg by mouth daily.      Respiratory Therapy Supplies (FLUTTER) DEVI Use as directed 1 each 0   rivaroxaban (XARELTO) 20 MG TABS tablet Take 20 mg daily with supper by mouth.     Omadacycline Tosylate (NUZYRA) 150 MG TABS Take 2 tablets by mouth daily. (Patient not taking: No sig reported) 7 tablet 0   Omadacycline Tosylate (NUZYRA) 150 MG TABS Take 300 mg by mouth daily. 28 tablet 0   No current facility-administered medications for this visit.     Past Medical History:  Diagnosis Date   Allergic rhinitis    Aortic valve disorder    Asthma     since childhood- seasonal allergies induced   Cancer ([place])    Skin cancer- squamous, basal   Carotid artery stenosis    Essential hypertension    Full dentures    GERD (gastroesophageal reflux disease)    H/O hiatal hernia    Hemorrhage of rectum    Hyperlipidemia    Hypothyroidism    Male circumcision    OSA (obstructive sleep apnea)    Osteoarthritis    Pacemaker    Oct 2005 in [place].   PAF (paroxysmal atrial fibrillation) ([place])    Pneumonia    "several Times" 2015 last time   Primary localized osteoarthritis of right knee 08/11/2017   RBBB (right bundle branch block)    Sinoatrial node dysfunction (HCC)    Syncope    Tricuspid valve disorder    Type 2 diabetes mellitus ([place])    Type II    ROS:   All systems reviewed and negative except as noted in the HPI.   Past Surgical History:  Procedure Laterality Date   A-V CARDIAC PACEMAKER INSERTION     Sick sinus syndrome DDR pacer   Arthropathy Right 2005   Rebuilding of left thumb and joint    CARDIAC CATHETERIZATION     CARDIAC ELECTROPHYSIOLOGY STUDY AND ABLATION  09/2008   for pvcs, Dr. Loralie Champagne   CARDIOVERSION N/A 12/18/2016   Procedure: CARDIOVERSION;  Surgeon: Pixie Casino, MD;  Location: [facility] ENDOSCOPY;  Service: Cardiovascular;  Laterality: N/A;   CARDIOVERSION N/A 09/05/2021   Procedure: CARDIOVERSION;  Surgeon: Geralynn Rile, MD;  Location: [place];  Service: Cardiovascular;  Laterality: N/A;   [place]   right wrist   CARPAL TUNNEL RELEASE  05/04/2012   Procedure: CARPAL TUNNEL RELEASE;  Surgeon: Wynonia Sours, MD;  Location: [place];  Service: Orthopedics;  Laterality: Left;   CARPOMETACARPEL SUSPENSION PLASTY Right 11/16/2014   Procedure: SUSPENSIONPLASTY RIGHT THUMB TENDON TRANSFER ABDUCTOR POLLICUS LONGUS EXCISION TRAPEZIUM;  Surgeon: Daryll Brod, MD;  Location: [place];  Service: Orthopedics;  Laterality: Right;   CHOLECYSTECTOMY  1994    CIRCUMCISION     COLONOSCOPY N/A 03/14/2013   Procedure: COLONOSCOPY;  Surgeon: Daneil Dolin, MD;  [facility];  Service: Endoscopy;  Laterality: N/A;  8:15 AM   EYE SURGERY     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-[facility]   EYE SURGERY  2012   Left eye Corneal transplant- partial- Cataract   FLEXIBLE BRONCHOSCOPY Right 06/23/2019   Procedure: FLEXIBLE BRONCHOSCOPY RIGHT;  Surgeon: [name], MD;  Location: [facility] ORS;  Service: Pulmonary;  Laterality: Right;   GALLBLADDER SURGERY  12/01/2006  HAND TENDON SURGERY Left late 1990's   thumb   HEMORROIDECTOMY  2003   INJECTION KNEE Left 08/26/2017   Procedure: LEFT KNEE INJECTION;  Surgeon: Elsie Saas, MD;  Location: [place];  Service: Orthopedics;  Laterality: Left;   left Knee Arthroscopy     [facility]   PARTIAL KNEE ARTHROPLASTY  11/22/2012   Procedure: UNICOMPARTMENTAL KNEE;  Surgeon: Lorn Junes, MD;  Location: [place];  Service: Orthopedics;  Laterality: Left;  left unicompartmental knee arthroplasty   PERMANENT PACEMAKER GENERATOR CHANGE N/A 01/12/2013   Procedure: PERMANENT PACEMAKER GENERATOR CHANGE;  Surgeon: Evans Lance, MD;  Location: [facility] C[facility] LAB;  Service: Cardiovascular;  Laterality: N/A;   Rotator cuff Surgery  2001   Right shoulder   TONSILLECTOMY     TOTAL KNEE ARTHROPLASTY Right 08/26/2017   Procedure: TOTAL KNEE ARTHROPLASTY;  Surgeon: Elsie Saas, MD;  Location: [place];  Service: Orthopedics;  Laterality: Right;   varicose vein reduction       Family History  Problem Relation Age of Onset   Other Father 55       Sudden Cardiac death   Pancreatic cancer Mother    Colon cancer Mother    Colon cancer Maternal Aunt    Colon polyps Neg Hx      Social History   Socioeconomic History   Marital status: Married    Spouse name: Not on file   Number of children: Not on file   Years of education: Not on file   Highest education level: Not on file  Occupational  History   Not on file  Tobacco Use   Smoking status: Former    Packs/day: 3.00    Years: 25.00    Pack years: 75.00    Types: Cigarettes    Start date: 02/20/1958    Quit date: 02/12/1984    Years since quitting: 37.6   Smokeless tobacco: Former    Types: Chew   Tobacco comments:    Former smoker 08/30/2021  Vaping Use   Vaping Use: Never used  Substance and Sexual Activity   Alcohol use: Yes    Alcohol/week: 2.0 - 4.0 standard drinks    Types: 1 - 2 Cans of beer, 1 - 2 Glasses of wine per week   Drug use: No   Sexual activity: Not on file  Other Topics Concern   Not on file  Social History Narrative   Regular exercise: No   Social Determinants of Health   Financial Resource Strain: Not on file  Food Insecurity: Not on file  Transportation Needs: Not on file  Physical Activity: Not on file  Stress: Not on file  Social Connections: Not on file  Intimate Partner Violence: Not on file     BP (!) 142/66   Pulse 78   Ht _0  (1.753 m)   Wt 228 lb (103.4 kg)   SpO2 90%   BMI 33.67 kg/m   Physical Exam:  Well appearing NAD HEENT: Unremarkable Neck:  No JVD, no thyromegally Lymphatics:  No adenopathy Back:  No CVA tenderness Lungs:  Clear HEART:  Regular rate rhythm, no murmurs, no rubs, no clicks Abd:  soft, positive bowel sounds, no organomegally, no rebound, no guarding Ext:  2 plus pulses, no edema, no cyanosis, no clubbing Skin:  No rashes no nodules Neuro:  CN II through XII intact, motor grossly intact  EKG  DEVICE  Normal device function.  See PaceArt for details.  Assess/Plan:  1. PAF - he will continue flecainide. No atrial fib since his DCCV 2. Sinus node dysfunction - he is asymptomatic, s/p PPM and is dependent down to 30/min. 3. PPM - his St. Jude DDD PM is working normally. We recheck in several months. 4. 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 He will continue his current meds. His heart failure is much improved in NSR.    Carleene Overlie  Kyzer Blowe,MD

## 2021-09-13 NOTE — Patient Instructions (Signed)
Medication Instructions:  Your physician recommends that you continue on your current medications as directed. Please refer to the Current Medication list given to you today.  *If you need a refill on your cardiac medications before your next appointment, please call your pharmacy*   Lab Work: None ordered   Testing/Procedures: None ordered   Follow-Up: At [facility], you and your health needs are our priority.  As part of our continuing mission to provide you with exceptional heart care, we have created designated Provider Care Teams.  These Care Teams include your primary Cardiologist (physician) and Advanced Practice Providers (APPs -  Physician Assistants and Nurse Practitioners) who all work together to provide you with the care you need, when you need it.  Your next appointment:   6 month(s)  The format for your next appointment:   In Person  Provider:   Cristopher Peru, MD    Thank you for choosing [facility]!!   Trinidad Curet, RN [phone]

## 2021-09-16 ENCOUNTER — Ambulatory Visit: Payer: Medicare Other | Admitting: Pulmonary Disease

## 2021-09-16 ENCOUNTER — Other Ambulatory Visit: Payer: Self-pay

## 2021-09-16 ENCOUNTER — Encounter: Payer: Self-pay | Admitting: Pulmonary Disease

## 2021-09-16 VITALS — BP 120/60 | HR 77 | Temp 98.0°F | Ht 69.0 in | Wt 226.8 lb

## 2021-09-16 DIAGNOSIS — J9611 Chronic respiratory failure with hypoxia: Secondary | ICD-10-CM

## 2021-09-16 DIAGNOSIS — J479 Bronchiectasis, uncomplicated: Secondary | ICD-10-CM

## 2021-09-16 DIAGNOSIS — J449 Chronic obstructive pulmonary disease, unspecified: Secondary | ICD-10-CM

## 2021-09-16 MED ORDER — BREZTRI AEROSPHERE 160-9-4.8 MCG/ACT IN AERO: 2.0000 | INHALATION_SPRAY | Freq: Two times a day (BID) | RESPIRATORY_TRACT | 0 refills | Status: DC

## 2021-09-16 NOTE — Progress Notes (Signed)
Subjective:    Patient ID: Clarence Dawson, male    DOB: 11-Sep-1946, 75 y.o.   MRN: [id number] Chief Complaint  Patient presents with   Follow-up    Sob with exertion and prod cough with clear to bloody brown sputum mainly in the morning    HPI Patient is a 75 year old former smoker (49 PY) with asthma COPD overlap syndrome, bronchiectasis and chronic respiratory failure who presents for follow-up.  This is a scheduled visit.  He was last seen on 15 July 2021.  At that time he continued on Trelegy Ellipta which he felt was doing well.  Now he feels that the Trelegy is not lasting him the full day.  He notes need for increased albuterol use in the evenings.  At his past visit TOBI had been discontinued due to the patient developed a sensitivity to it.  Patient also developed some issues with atrial fibrillation and required cardioversion on 9/8.  He has not had any recurrence since then.  Notes that his dyspnea improved significantly after his cardioversion.  He is compliant with vest physiotherapy.  Compliant with oxygen supplementation for chronic respiratory failure with hypoxia and also compliant with CPAP for OSA which is managed by his primary care physician.  He does not note any other issues today.  Review of Systems A 10 point review of systems was performed and it is as noted above otherwise negative.  Patient Active Problem List   Diagnosis Date Noted   Secondary hypercoagulable state ([place]) 01/02/2020   Chronic diastolic heart failure ([place]) 11/22/2019   Paroxysmal atrial fibrillation ([place]) 09/22/2019   Atrial fibrillation with RVR ([place])    Lobar pneumonia ([place]) 09/21/2019   Acute respiratory failure with hypoxia ([place]) 09/21/2019   Acute on chronic respiratory failure ([place]) 09/18/2019   Severe sepsis (HCC)    Elevated lactic acid level    GERD (gastroesophageal reflux disease)    Chronic bronchitis ([place]) 02/09/2019   CAP (community acquired pneumonia) 03/03/2018   Primary  localized osteoarthritis of right knee 08/11/2017   Asthma with acute exacerbation 12/08/2016   Chest pain 10/20/2016   HTN (hypertension) 10/20/2016   Morbid obesity due to excess calories ([place]) 06/24/2016   Severe persistent asthma 02/12/2016   Upper airway cough syndrome 01/24/2016   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   Persistent atrial fibrillation    Syncope    Hyperthyroidism    Hyperlipidemia    RBBB (right bundle branch block)    Tricuspid valve disorder    Aortic valve disorder    Carotid artery stenosis    Type 2 diabetes mellitus ([place])    Pacemaker    OSA (obstructive sleep apnea)    Arthritis    Sinoatrial node dysfunction (HCC)    PVC's (premature ventricular contractions) 07/17/2011   Essential hypertension, benign 01/24/2011   Premature ventricular contractions 01/24/2011   PPM-St.Jude 01/24/2011   Social History   Tobacco Use   Smoking status: Former    Packs/day: 3.00    Years: 25.00    Pack years: 75.00    Types: Cigarettes    Start date: 02/20/1958    Quit date: 02/12/1984    Years since quitting: 37.6   Smokeless tobacco: Former    Types: Chew   Tobacco comments:    Former smoker 08/30/2021  Substance Use Topics   Alcohol use: Yes    Alcohol/week: 2.0 - 4.0 standard drinks    Types: 1 - 2 Cans of beer, 1 - 2 Glasses of  wine per week   Allergies  Allergen Reactions   Food Anaphylaxis and Shortness Of Breath    TREE NUTS   Iodinated Diagnostic Agents Hives and Shortness Of Breath    Patient states hives to throat closing. (01/15/17: patient states this reaction was "about 20 years ago" with possibly an IVP.  He now says high doses of prednisone "throw me into AFib."  He has tolerated CT arthrograms with Benadrly 50mg  PO one hour before injection.  Brita Romp, RN)   Shellfish Allergy Anaphylaxis and Shortness Of Breath    To shellfish, crabs.  Makes him feel like "things are crawling all over" me.  Denies airway issues with these foods.  Brita Romp, RN  01/15/17)   Goat-Derived Products Hives    GOAT CHEESE    Prednisone Palpitations    PRECIPITATES A-FIB   Metformin And Related Diarrhea    High doses at once   Vancomycin Anxiety   Voltaren [Diclofenac Sodium] Other (See Comments)    Feels like things are crawling on him   Current Meds  Medication Sig   acetaminophen (TYLENOL) 500 MG tablet Take 1,000 mg by mouth 3 (three) times daily.   albuterol (PROVENTIL HFA;VENTOLIN HFA) 108 (90 Base) MCG/ACT inhaler Inhale 2 puffs every 4 (four) hours as needed into the lungs for shortness of breath (only if you can't catch your breath).   atorvastatin (LIPITOR) 20 MG tablet Take 20 mg by mouth at bedtime.   bisoprolol (ZEBETA) 5 MG tablet TAKE 1 AND 1/2 TABLET BY MOUTH TWICE DAILY.   Carboxymethylcellul-Glycerin (LUBRICATING EYE DROPS OP) Place 1 drop into the right eye daily as needed (dry eyes).   diltiazem (CARDIZEM CD) 120 MG 24 hr capsule Take 1 capsule (120 mg total) by mouth daily. May take an extra tablet daily for breakthrough afib (Patient taking differently: Take 120 mg by mouth in the morning and at bedtime. May take an extra tablet daily for breakthrough afib)   diphenhydramine-acetaminophen (TYLENOL PM) 25-500 MG TABS tablet Take 1 tablet by mouth at bedtime as needed (sleep/pain).   EPINEPHrine 0.3 mg/0.3 mL IJ SOAJ injection Inject 0.3 mg into the muscle as needed for anaphylaxis.   flecainide (TAMBOCOR) 150 MG tablet TAKE 1/2 TABLET BY MOUTH TWICE DAILY.   Fluticasone-Umeclidin-Vilant (TRELEGY ELLIPTA) 200-62.5-25 MCG/INH AEPB Inhale 200 mcg into the lungs daily.   furosemide (LASIX) 40 MG tablet TAKE 1 TABLET BY MOUTH DAILY   glipiZIDE (GLUCOTROL XL) 5 MG 24 hr tablet Take 5 mg by mouth daily before breakfast.   JARDIANCE 25 MG TABS tablet Take 25 mg by mouth daily before breakfast.   levothyroxine (SYNTHROID, LEVOTHROID) 137 MCG tablet Take 137 mcg by mouth daily before breakfast.    losartan (COZAAR) 50 MG tablet Take 50 mg by  mouth at bedtime.   metFORMIN (GLUCOPHAGE-XR) 500 MG 24 hr tablet Take 500 mg by mouth in the morning and at bedtime.   Multiple Vitamin (MULTIVITAMIN) tablet Take 1 tablet by mouth daily.   omalizumab Arvid Right) 150 MG/ML prefilled syringe Inject 300 mg into the skin every 28 (twenty-eight) days.   omeprazole (PRILOSEC OTC) 20 MG tablet Take 20 mg by mouth daily.    Respiratory Therapy Supplies (FLUTTER) DEVI Use as directed   rivaroxaban (XARELTO) 20 MG TABS tablet Take 20 mg daily with supper by mouth.   Immunization History  Administered Date(s) Administered   Fluad Quad(high Dose 65+) 09/12/2019   Influenza Split 11/14/2015, 10/11/2018   Influenza, High Dose Seasonal PF  09/28/2017   Influenza-Unspecified 11/02/2016, 09/03/2020   Moderna SARS-COV2 Booster Vaccination 08/20/2020   Moderna Sars-Covid-2 Vaccination 02/15/2020, 03/14/2020   Pneumococcal Conjugate-13 09/28/2015   Tdap 11/28/2019       Objective:   Physical Exam BP 120/60 (BP Location: Left Arm, Cuff Size: Normal)   Pulse 77   Temp 98 F (36.7 C) (Temporal)   Ht 5\' 9"  (1.753 m)   Wt 226 lb 12.8 oz (102.9 kg)   SpO2 90%   BMI 33.49 kg/m   GENERAL: Well-developed, obese patient in no acute distress.  He is comfortable on nasal cannula O2.  He is fully ambulatory. HEAD: Normocephalic, atraumatic.  EYES: Pupils equal, round, reactive to light.  No scleral icterus.  MOUTH: Nose/mouth/throat not examined due to masking requirements for COVID 19.   NECK: Supple. No thyromegaly. Trachea midline. No JVD.  No adenopathy. PULMONARY: Good air entry bilaterally.  Symmetrical, nonlabored, no adventitious sounds. CARDIOVASCULAR: S1 and S2. Regular rate and rhythm.  No rubs murmurs gallops heard.  Pacemaker on left.  Grade 2/6 systolic ejection murmur at the left sternal border. GASTROINTESTINAL: Obese otherwise benign. MUSCULOSKELETAL: No joint deformity, no clubbing, no edema.  NEUROLOGIC: No focal deficits, speech is fluent.   No gait disturbance noted.  Awake, alert and oriented. SKIN: Intact,warm,dry. PSYCH: Mood and behavior appropriate.      Assessment & Plan:     ICD-10-CM   1. Asthma-COPD overlap syndrome (HCC)  J44.9    Switch Trelegy to Breztri 2 puffs twice a day Patient to let us know how [name] is doing for him    2. Bronchiectasis without complication (HCC)  Q59.5    Continue vest physiotherapy     3. Chronic respiratory failure with hypoxia (HCC)  J96.11    Continue supplemental oxygen     Meds ordered this encounter  Medications   Budeson-Glycopyrrol-Formoterol (BREZTRI AEROSPHERE) 160-9-4.8 MCG/ACT AERO    Sig: Inhale 2 puffs into the lungs in the morning and at bedtime.    Dispense:  5.9 g    Refill:  0    Order Specific Question:   Lot Number?    Answer:   [id number]    Order Specific Question:   Expiration Date?    Answer:   03/29/2024    Order Specific Question:   Manufacturer?    Answer:   [employer] [[id number]]    Order Specific Question:   Quantity    Answer:   2   We have switched patient from Trelegy to [facility] this medication is actually better covered by his plan and also should hopefully keep him symptom-free throughout the day.  We will see him in follow-up in 3 months time he is to contact us sooner should any new difficulties arise.  Renold Don, MD Advanced Bronchoscopy PCCM [facility]    *This note was dictated using voice recognition software/Dragon.  Despite best efforts to proofread, errors can occur which can change the meaning. Any transcriptional errors that result from this process are unintentional and may not be fully corrected at the time of dictation.

## 2021-09-16 NOTE — Patient Instructions (Signed)
We are giving you a trial of Breztri 2 puffs twice a day  DO NOT [place]  Let us know how the [name] does for you so we can call the prescription in for you if it does better than the Trelegy  We will see her in follow-up in 3 months time call sooner should any new problems arise

## 2021-09-23 MED ORDER — BREZTRI AEROSPHERE 160-9-4.8 MCG/ACT IN AERO: 2.0000 | INHALATION_SPRAY | Freq: Two times a day (BID) | RESPIRATORY_TRACT | 11 refills | Status: DC

## 2021-10-07 ENCOUNTER — Other Ambulatory Visit: Payer: Self-pay | Admitting: Internal Medicine

## 2021-10-17 ENCOUNTER — Other Ambulatory Visit: Payer: Self-pay

## 2021-10-17 ENCOUNTER — Ambulatory Visit (HOSPITAL_COMMUNITY)
Admission: RE | Admit: 2021-10-17 | Discharge: 2021-10-17 | Disposition: A | Discharge status: Home / Self Care | Payer: Medicare Other | Source: Ambulatory Visit | Attending: Physician Assistant | Admitting: Physician Assistant

## 2021-10-17 ENCOUNTER — Encounter (HOSPITAL_COMMUNITY): Payer: Self-pay | Admitting: Physician Assistant

## 2021-10-17 VITALS — BP 130/80 | HR 74 | Ht 69.0 in | Wt 221.8 lb

## 2021-10-17 DIAGNOSIS — I4819 Other persistent atrial fibrillation: Secondary | ICD-10-CM | POA: Diagnosis not present

## 2021-10-17 DIAGNOSIS — D6869 Other thrombophilia: Secondary | ICD-10-CM | POA: Diagnosis not present

## 2021-10-17 DIAGNOSIS — I5022 Chronic systolic (congestive) heart failure: Secondary | ICD-10-CM | POA: Insufficient documentation

## 2021-10-17 DIAGNOSIS — Z87891 Personal history of nicotine dependence: Secondary | ICD-10-CM | POA: Insufficient documentation

## 2021-10-17 DIAGNOSIS — E118 Type 2 diabetes mellitus with unspecified complications: Secondary | ICD-10-CM | POA: Insufficient documentation

## 2021-10-17 DIAGNOSIS — Z6832 Body mass index (BMI) 32.0-32.9, adult: Secondary | ICD-10-CM | POA: Diagnosis not present

## 2021-10-17 DIAGNOSIS — J449 Chronic obstructive pulmonary disease, unspecified: Secondary | ICD-10-CM | POA: Insufficient documentation

## 2021-10-17 DIAGNOSIS — K219 Gastro-esophageal reflux disease without esophagitis: Secondary | ICD-10-CM | POA: Diagnosis not present

## 2021-10-17 DIAGNOSIS — Z7901 Long term (current) use of anticoagulants: Secondary | ICD-10-CM | POA: Diagnosis not present

## 2021-10-17 DIAGNOSIS — I110 Hypertensive heart disease with heart failure: Secondary | ICD-10-CM | POA: Diagnosis not present

## 2021-10-17 DIAGNOSIS — Z09 Encounter for follow-up examination after completed treatment for conditions other than malignant neoplasm: Secondary | ICD-10-CM | POA: Insufficient documentation

## 2021-10-17 DIAGNOSIS — Z7984 Long term (current) use of oral hypoglycemic drugs: Secondary | ICD-10-CM | POA: Insufficient documentation

## 2021-10-17 DIAGNOSIS — Z79899 Other long term (current) drug therapy: Secondary | ICD-10-CM | POA: Diagnosis not present

## 2021-10-17 DIAGNOSIS — E669 Obesity, unspecified: Secondary | ICD-10-CM | POA: Insufficient documentation

## 2021-10-17 DIAGNOSIS — I495 Sick sinus syndrome: Secondary | ICD-10-CM | POA: Diagnosis not present

## 2021-10-17 NOTE — Progress Notes (Signed)
Primary Care Physician: [name], MD Primary Electrophysiologist: Dr Lovena Le Referring Physician: Dr Carlos Levering is a 75 y.o. male with a history of SSS (s/p PPM placement with gen change in 2014), paroxysmal atrial fibrillation, chronic diastolic CHF, HTN, COPD, DM, and GERD who presents for follow up in the [facility]. Patient was recently admitted with elevated temp and AMS and was diagnosed with severe sepsis 2/2 pneumonia. During his admission, he developed afib with RVR. He was given a bolus of flecainide which converted him to atrial flutter. Patient was fairly asymptomatic. He is on Xarelto for a CHADS2VASC score of 3.  The device clinic received an alert for an ongoing afib episode starting 04/09/21. He did have two alcohol drinks the night before and was also being treated for an URI. COVID test was negative. He was back in SR before follow up.  Patient was noted to be out of rhythm by the device clinic on 08/23/21 and underwent DCCV on 09/05/21. Patient reports that he has felt well since that time. He is in SR today. He denies any bleeding issues on anticoagulation.   Today, he denies symptoms of palpitations, chest pain, orthopnea, PND, lower extremity edema, dizziness, presyncope, syncope, snoring, daytime somnolence, bleeding, or neurologic sequela. The patient is tolerating medications without difficulties and is otherwise without complaint today.    Atrial Fibrillation Risk Factors:  he does not have symptoms or diagnosis of sleep apnea. he does not have a history of rheumatic fever. he does not have a history of alcohol use. The patient does not have a history of early familial atrial fibrillation or other arrhythmias.  he has a BMI of Body mass index is 32.75 kg/m.Marland Kitchen Filed Weights   10/17/21 1020  Weight: 100.6 kg      Family History  Problem Relation Age of Onset   Other Father 32       Sudden Cardiac death   Pancreatic  cancer Mother    Colon cancer Mother    Colon cancer Maternal Aunt    Colon polyps Neg Hx      Atrial Fibrillation Management history:  Previous antiarrhythmic drugs: flecainide Previous cardioversions: 11/2016, 09/05/21 Previous ablations: none CHADS2VASC score: 3 Anticoagulation history: Xarelto   Past Medical History:  Diagnosis Date   Allergic rhinitis    Aortic valve disorder    Asthma    since childhood- seasonal allergies induced   Cancer ([place])    Skin cancer- squamous, basal   Carotid artery stenosis    Essential hypertension    Full dentures    GERD (gastroesophageal reflux disease)    H/O hiatal hernia    Hemorrhage of rectum    Hyperlipidemia    Hypothyroidism    Male circumcision    OSA (obstructive sleep apnea)    Osteoarthritis    Pacemaker    Oct 2005 in [place].   PAF (paroxysmal atrial fibrillation) ([place])    Pneumonia    "several Times" 2015 last time   Primary localized osteoarthritis of right knee 08/11/2017   RBBB (right bundle branch block)    Sinoatrial node dysfunction (HCC)    Syncope    Tricuspid valve disorder    Type 2 diabetes mellitus ([place])    Type II   Past Surgical History:  Procedure Laterality Date   A-V CARDIAC PACEMAKER INSERTION     Sick sinus syndrome DDR pacer   Arthropathy Right 2005   Rebuilding of left thumb  and joint    CARDIAC CATHETERIZATION     CARDIAC ELECTROPHYSIOLOGY STUDY AND ABLATION  09/2008   for pvcs, Dr. Loralie Champagne   CARDIOVERSION N/A 12/18/2016   Procedure: CARDIOVERSION;  Surgeon: Pixie Casino, MD;  Location: [facility] ENDOSCOPY;  Service: Cardiovascular;  Laterality: N/A;   CARDIOVERSION N/A 09/05/2021   Procedure: CARDIOVERSION;  Surgeon: Geralynn Rile, MD;  Location: [place];  Service: Cardiovascular;  Laterality: N/A;   [place]   right wrist   CARPAL TUNNEL RELEASE  05/04/2012   Procedure: CARPAL TUNNEL RELEASE;  Surgeon: Wynonia Sours, MD;  Location: [place];  Service: Orthopedics;  Laterality: Left;   CARPOMETACARPEL SUSPENSION PLASTY Right 11/16/2014   Procedure: SUSPENSIONPLASTY RIGHT THUMB TENDON TRANSFER ABDUCTOR POLLICUS LONGUS EXCISION TRAPEZIUM;  Surgeon: Daryll Brod, MD;  Location: [place];  Service: Orthopedics;  Laterality: Right;   CHOLECYSTECTOMY  1994   CIRCUMCISION     COLONOSCOPY N/A 03/14/2013   Procedure: COLONOSCOPY;  Surgeon: Daneil Dolin, MD;  [facility];  Service: Endoscopy;  Laterality: N/A;  8:15 AM   EYE SURGERY     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-[facility]   EYE SURGERY  2012   Left eye Corneal transplant- partial- Cataract   FLEXIBLE BRONCHOSCOPY Right 06/23/2019   Procedure: FLEXIBLE BRONCHOSCOPY RIGHT;  Surgeon: [name], MD;  Location: [facility] ORS;  Service: Pulmonary;  Laterality: Right;   GALLBLADDER SURGERY  12/01/2006   HAND TENDON SURGERY Left late 1990's   thumb   HEMORROIDECTOMY  2003   INJECTION KNEE Left 08/26/2017   Procedure: LEFT KNEE INJECTION;  Surgeon: Elsie Saas, MD;  Location: [place];  Service: Orthopedics;  Laterality: Left;   left Knee Arthroscopy     [facility]- [facility]   PARTIAL KNEE ARTHROPLASTY  11/22/2012   Procedure: UNICOMPARTMENTAL KNEE;  Surgeon: Lorn Junes, MD;  Location: [place];  Service: Orthopedics;  Laterality: Left;  left unicompartmental knee arthroplasty   PERMANENT PACEMAKER GENERATOR CHANGE N/A 01/12/2013   Procedure: PERMANENT PACEMAKER GENERATOR CHANGE;  Surgeon: Evans Lance, MD;  Location: [facility] CATH LAB;  Service: Cardiovascular;  Laterality: N/A;   Rotator cuff Surgery  2001   Right shoulder   TONSILLECTOMY     TOTAL KNEE ARTHROPLASTY Right 08/26/2017   Procedure: TOTAL KNEE ARTHROPLASTY;  Surgeon: Elsie Saas, MD;  Location: [place];  Service: Orthopedics;  Laterality: Right;   varicose vein reduction      Current Outpatient Medications  Medication Sig Dispense Refill   acetaminophen (TYLENOL) 500  MG tablet Take 1,000 mg by mouth 3 (three) times daily.     albuterol (PROVENTIL HFA;VENTOLIN HFA) 108 (90 Base) MCG/ACT inhaler Inhale 2 puffs every 4 (four) hours as needed into the lungs for shortness of breath (only if you can't catch your breath).     atorvastatin (LIPITOR) 20 MG tablet Take 20 mg by mouth at bedtime.     bisoprolol (ZEBETA) 5 MG tablet TAKE 1 AND 1/2 TABLET BY MOUTH TWICE DAILY. 90 tablet 3   Budeson-Glycopyrrol-Formoterol (BREZTRI AEROSPHERE) 160-9-4.8 MCG/ACT AERO Inhale 2 puffs into the lungs in the morning and at bedtime. 10.7 g 11   Carboxymethylcellul-Glycerin (LUBRICATING EYE DROPS OP) Place 1 drop into the right eye daily as needed (dry eyes).     diltiazem (CARDIZEM CD) 120 MG 24 hr capsule Take 1 capsule (120 mg total) by mouth daily. May take an extra tablet daily for breakthrough afib (Patient  taking differently: Take 120 mg by mouth in the morning and at bedtime. May take an extra tablet daily for breakthrough afib) 120 capsule 1   diphenhydramine-acetaminophen (TYLENOL PM) 25-500 MG TABS tablet Take 1 tablet by mouth at bedtime as needed (sleep/pain).     EPINEPHrine 0.3 mg/0.3 mL IJ SOAJ injection Inject 0.3 mg into the muscle as needed for anaphylaxis. 1 each 5   flecainide (TAMBOCOR) 150 MG tablet TAKE 1/2 TABLET BY MOUTH TWICE DAILY. 90 tablet 0   furosemide (LASIX) 40 MG tablet TAKE 1 TABLET BY MOUTH DAILY 90 tablet 3   glipiZIDE (GLUCOTROL XL) 5 MG 24 hr tablet Take 5 mg by mouth daily before breakfast.     JARDIANCE 25 MG TABS tablet Take 25 mg by mouth daily before breakfast.     levothyroxine (SYNTHROID, LEVOTHROID) 137 MCG tablet Take 137 mcg by mouth daily before breakfast.      losartan (COZAAR) 50 MG tablet Take 50 mg by mouth at bedtime.     metFORMIN (GLUCOPHAGE-XR) 500 MG 24 hr tablet Take 500 mg by mouth in the morning and at bedtime.     Multiple Vitamin (MULTIVITAMIN) tablet Take 1 tablet by mouth daily.     omalizumab Arvid Right) 150 MG/ML  prefilled syringe Inject 300 mg into the skin every 28 (twenty-eight) days. 2 mL 11   omeprazole (PRILOSEC OTC) 20 MG tablet Take 20 mg by mouth daily.      Respiratory Therapy Supplies (FLUTTER) DEVI Use as directed 1 each 0   rivaroxaban (XARELTO) 20 MG TABS tablet Take 20 mg daily with supper by mouth.     No current facility-administered medications for this encounter.    Allergies  Allergen Reactions   Food Anaphylaxis and Shortness Of Breath    TREE NUTS   Iodinated Diagnostic Agents Hives and Shortness Of Breath    Patient states hives to throat closing. (01/15/17: patient states this reaction was "about 20 years ago" with possibly an IVP.  He now says high doses of prednisone "throw me into AFib."  He has tolerated CT arthrograms with Benadrly 70m PO one hour before injection.  JBrita Romp RN)   Shellfish Allergy Anaphylaxis and Shortness Of Breath    To shellfish, crabs.  Makes him feel like "things are crawling all over" me.  Denies airway issues with these foods.  (Brita Romp RN 01/15/17)   Goat-Derived Products Hives    GOAT CHEESE    Prednisone Palpitations    PRECIPITATES A-FIB   Metformin And Related Diarrhea    High doses at once   Vancomycin Anxiety   Voltaren [Diclofenac Sodium] Other (See Comments)    Feels like things are crawling on him    Social History   Socioeconomic History   Marital status: Married    Spouse name: Not on file   Number of children: Not on file   Years of education: Not on file   Highest education level: Not on file  Occupational History   Not on file  Tobacco Use   Smoking status: Former    Packs/day: 3.00    Years: 25.00    Pack years: 75.00    Types: Cigarettes    Start date: 02/20/1958    Quit date: 02/12/1984    Years since quitting: 37.7   Smokeless tobacco: Former    Types: Chew   Tobacco comments:    Former smoker 08/30/2021  Vaping Use   Vaping Use: Never used  Substance and Sexual Activity  Alcohol use: Yes     Alcohol/week: 2.0 - 4.0 standard drinks    Types: 1 - 2 Cans of beer, 1 - 2 Glasses of wine per week   Drug use: No   Sexual activity: Not on file  Other Topics Concern   Not on file  Social History Narrative   Regular exercise: No   Social Determinants of Health   Financial Resource Strain: Not on file  Food Insecurity: Not on file  Transportation Needs: Not on file  Physical Activity: Not on file  Stress: Not on file  Social Connections: Not on file  Intimate Partner Violence: Not on file     ROS- All systems are reviewed and negative except as per the HPI above.  Physical Exam: Vitals:   10/17/21 1020  BP: 130/80  Pulse: 74  Weight: 100.6 kg  Height: _0  (1.753 m)    GEN- The patient is a well appearing obese elderly male, alert and oriented x 3 today.   HEENT-head normocephalic, atraumatic, sclera clear, conjunctiva pink, hearing intact, trachea midline. Lungs- Clear to ausculation bilaterally, normal work of breathing, on O2 [place] Heart- Regular rate and rhythm, no murmurs, rubs or gallops  GI- soft, NT, ND, + BS Extremities- no clubbing, cyanosis, or edema MS- no significant deformity or atrophy Skin- no rash or lesion Psych- euthymic mood, full affect Neuro- strength and sensation are intact   Wt Readings from Last 3 Encounters:  10/17/21 100.6 kg  09/16/21 102.9 kg  09/13/21 103.4 kg    EKG today demonstrates A paced rhythm RBBB Vent. rate 74 BPM PR interval * ms QRS duration 174 ms QT/QTcB 446/495 ms  Echo 10/21/16 demonstrated  - Left ventricle: The cavity size was normal. Wall thickness was   increased in a pattern of mild LVH. Systolic function was normal.   The estimated ejection fraction was in the range of 60% to 65%.   Wall motion was normal; there were no regional wall motion   abnormalities. Features are consistent with a pseudonormal left   ventricular filling pattern, with concomitant abnormal relaxation   and increased filling  pressure (grade 2 diastolic dysfunction). - Aortic valve: Mildly to moderately calcified annulus. Moderately   calcified leaflets. There was mild to moderate stenosis. Valve   area (VTI): 1.3 cm^2. - Mitral valve: Calcified annulus. There was trivial regurgitation. - Left atrium: The atrium was mildly to moderately dilated. - Right ventricle: Pacer wire or catheter noted in right ventricle. - Right atrium: Central venous pressure (est): 3 mm Hg. - Tricuspid valve: There was trivial regurgitation. - Pulmonary arteries: PA peak pressure: 48 mm Hg (S). - Pericardium, extracardiac: A prominent pericardial fat pad was   present.   Impressions:   - Mild LVH with LVEF 60-65%. Grade 2 diastolic dysfunction. Mild to   moderate left atrial enlargement. Mildly calcified mitral annulus   with trivial mitral regurgitation. Moderately calcified aortic   valve, leaflet structure difficult to identify. There is evidence   of mild to moderate aortic stenosis. Device wire present in the   right heart. Trivial tricuspid regurgitation with PASP 48 mmHg.  Epic records are reviewed at length today  Assessment and Plan:  1. Persistent atrial fibrillation Patient appears to be maintaining SR.  Continue flecainide 75 mg BID Continue diltiazem 120 mg daily  Continue bisoprolol 7.5 mg BID Continue Xarelto 20 mg daily    This patients CHA2DS2-VASc Score and unadjusted Ischemic Stroke Rate (% per year) is equal to  3.2 % stroke rate/year from a score of 3  Above score calculated as 1 point each if present [CHF, HTN, DM, Vascular=MI/PAD/Aortic Plaque, Age if 65-74, or Male] Above score calculated as 2 points each if present [Age > 75, or Stroke/TIA/TE]  2. Obesity Body mass index is 32.75 kg/m. Lifestyle modification was discussed and encouraged including regular physical activity and weight reduction.  3. SSS S/p PPM, followed by Dr Lovena Le and the device clinic.  4. HTN Stable, no changes  today.   Follow up with Dr Lovena Le as scheduled. AF clinic in one year.    [facility] [address] [place], [place] [address] [phone] 10/17/2021 10:35 AM

## 2021-11-05 ENCOUNTER — Other Ambulatory Visit: Payer: Self-pay | Admitting: Internal Medicine

## 2021-11-06 ENCOUNTER — Ambulatory Visit (INDEPENDENT_AMBULATORY_CARE_PROVIDER_SITE_OTHER): Payer: Medicare Other

## 2021-11-06 DIAGNOSIS — I495 Sick sinus syndrome: Secondary | ICD-10-CM | POA: Diagnosis not present

## 2021-11-06 LAB — CUP PACEART REMOTE DEVICE CHECK
Battery Remaining Longevity: 8 mo
Battery Remaining Percentage: 7 %
Battery Voltage: 2.78 V
Brady Statistic AP VP Percent: 2.5 %
Brady Statistic AP VS Percent: 97 %
Brady Statistic AS VP Percent: 1 %
Brady Statistic AS VS Percent: 1 %
Brady Statistic RA Percent Paced: 99 %
Brady Statistic RV Percent Paced: 2.6 %
Date Time Interrogation Session: 20221109031446
Implantable Lead Implant Date: 20051106
Implantable Lead Implant Date: 20051106
Implantable Lead Location: 753859
Implantable Lead Location: 753860
Implantable Pulse Generator Implant Date: 20140115
Lead Channel Impedance Value: 360 Ohm
Lead Channel Impedance Value: 660 Ohm
Lead Channel Pacing Threshold Amplitude: 0.75 V
Lead Channel Pacing Threshold Amplitude: 1 V
Lead Channel Pacing Threshold Pulse Width: 0.4 ms
Lead Channel Pacing Threshold Pulse Width: 0.4 ms
Lead Channel Sensing Intrinsic Amplitude: 0.9 mV
Lead Channel Sensing Intrinsic Amplitude: 12 mV
Lead Channel Setting Pacing Amplitude: 2 V
Lead Channel Setting Pacing Amplitude: 2.5 V
Lead Channel Setting Pacing Pulse Width: 0.4 ms
Lead Channel Setting Sensing Sensitivity: 2 mV
Pulse Gen Model: 2210
Pulse Gen Serial Number: 7439597

## 2021-11-07 ENCOUNTER — Telehealth: Payer: Self-pay

## 2021-11-07 NOTE — Telephone Encounter (Signed)
Scheduled remote reviewed. Normal device function.   5 AF events, longest duration 2hrs 79min, first AF since DCCV 9/8 Burden <1%, possible undersensing of atrial arrhythmia Battery estimated 7.87mo Route to triage, battery, AF  AF Episode on 10/25 3 back to back episodes totaling<4hours   Meds include: Bisoprolol 7.5mg  BID, Flecainide 75mg  BID, Diltiazem 120mg  +1 PRN dose for breakthrough.  Xarelto for [place].    Spoke with patient, he denies being symptomatic at time of episodes.  He confirmed compliance with meds as ordered.  Will continue monitoring for now.    Advised patient of battery status and monthly battery checks, next scheduled check 12/06/21.

## 2021-11-14 NOTE — Progress Notes (Signed)
Remote pacemaker transmission.   

## 2021-12-06 ENCOUNTER — Ambulatory Visit (INDEPENDENT_AMBULATORY_CARE_PROVIDER_SITE_OTHER): Payer: Medicare Other

## 2021-12-06 DIAGNOSIS — I495 Sick sinus syndrome: Secondary | ICD-10-CM

## 2021-12-09 LAB — CUP PACEART REMOTE DEVICE CHECK
Battery Remaining Longevity: 7 mo
Battery Remaining Percentage: 6 %
Battery Voltage: 2.77 V
Brady Statistic AP VP Percent: 2.9 %
Brady Statistic AP VS Percent: 97 %
Brady Statistic AS VP Percent: 1 %
Brady Statistic AS VS Percent: 1 %
Brady Statistic RA Percent Paced: 99 %
Brady Statistic RV Percent Paced: 3 %
Date Time Interrogation Session: 20221209110932
Implantable Lead Implant Date: 20051106
Implantable Lead Implant Date: 20051106
Implantable Lead Location: 753859
Implantable Lead Location: 753860
Implantable Pulse Generator Implant Date: 20140115
Lead Channel Impedance Value: 350 Ohm
Lead Channel Impedance Value: 660 Ohm
Lead Channel Pacing Threshold Amplitude: 0.75 V
Lead Channel Pacing Threshold Amplitude: 1 V
Lead Channel Pacing Threshold Pulse Width: 0.4 ms
Lead Channel Pacing Threshold Pulse Width: 0.4 ms
Lead Channel Sensing Intrinsic Amplitude: 1 mV
Lead Channel Sensing Intrinsic Amplitude: 12 mV
Lead Channel Setting Pacing Amplitude: 2 V
Lead Channel Setting Pacing Amplitude: 2.5 V
Lead Channel Setting Pacing Pulse Width: 0.4 ms
Lead Channel Setting Sensing Sensitivity: 2 mV
Pulse Gen Model: 2210
Pulse Gen Serial Number: 7439597

## 2021-12-11 ENCOUNTER — Other Ambulatory Visit (HOSPITAL_COMMUNITY): Payer: Self-pay | Admitting: Internal Medicine

## 2021-12-11 ENCOUNTER — Other Ambulatory Visit: Payer: Self-pay | Admitting: Internal Medicine

## 2021-12-11 DIAGNOSIS — Z8673 Personal history of transient ischemic attack (TIA), and cerebral infarction without residual deficits: Secondary | ICD-10-CM

## 2021-12-12 ENCOUNTER — Ambulatory Visit (HOSPITAL_COMMUNITY)
Admission: RE | Admit: 2021-12-12 | Discharge: 2021-12-12 | Disposition: A | Discharge status: Home / Self Care | Payer: Medicare Other | Source: Ambulatory Visit | Attending: Internal Medicine | Admitting: Internal Medicine

## 2021-12-12 ENCOUNTER — Other Ambulatory Visit: Payer: Self-pay

## 2021-12-12 DIAGNOSIS — Z8673 Personal history of transient ischemic attack (TIA), and cerebral infarction without residual deficits: Secondary | ICD-10-CM | POA: Insufficient documentation

## 2021-12-16 ENCOUNTER — Other Ambulatory Visit: Payer: Self-pay

## 2021-12-16 ENCOUNTER — Ambulatory Visit (INDEPENDENT_AMBULATORY_CARE_PROVIDER_SITE_OTHER): Payer: Medicare Other | Admitting: Pulmonary Disease

## 2021-12-16 ENCOUNTER — Encounter: Payer: Self-pay | Admitting: Pulmonary Disease

## 2021-12-16 VITALS — BP 132/76 | HR 91 | Temp 97.6°F | Ht 69.0 in | Wt 230.0 lb

## 2021-12-16 DIAGNOSIS — J449 Chronic obstructive pulmonary disease, unspecified: Secondary | ICD-10-CM | POA: Diagnosis not present

## 2021-12-16 DIAGNOSIS — J479 Bronchiectasis, uncomplicated: Secondary | ICD-10-CM | POA: Diagnosis not present

## 2021-12-16 DIAGNOSIS — G4733 Obstructive sleep apnea (adult) (pediatric): Secondary | ICD-10-CM | POA: Diagnosis not present

## 2021-12-16 DIAGNOSIS — J9611 Chronic respiratory failure with hypoxia: Secondary | ICD-10-CM

## 2021-12-16 NOTE — Addendum Note (Signed)
Addended by: Cheri Kearns A on: 12/16/2021 08:18 AM   Modules accepted: Level of Service

## 2021-12-16 NOTE — Progress Notes (Signed)
Remote pacemaker transmission.   

## 2021-12-16 NOTE — Patient Instructions (Signed)
Continue your medications and your therapy as is.  We will see you in follow-up in 3 months time call sooner should any new problems arise.

## 2021-12-16 NOTE — Progress Notes (Signed)
Subjective:    Patient ID: Clarence Dawson, male    DOB: 1946-12-08, 75 y.o.   MRN: [id number] Chief Complaint  Patient presents with   Follow-up    Prod cough with white sputum mainly in the morning.   HPI Patient is a 75 year old former smoker (42 PY) who presents for follow-up of asthma with COPD overlap, bronchiectasis and chronic respiratory failure with hypoxia.  He was last seen on 16 September 2021.  At that time we switched him to [place] twice a day.  This is a scheduled visit.  The patient notes that since being on Breztri he actually has been doing better with regards to his symptoms of shortness of breath and cough during the day.  His only complaint is that of increased congestion in the mornings which usually clears after he drinks a second cup of coffee in the morning.  He notes that he is on CPAP for sleep apnea and sometimes the [place] reservoir is empty in the mornings.  This is managed by his primary care physician.  He is compliant with vest physiotherapy and supplemental oxygen.  Currently he is having some increased stress due to his wife being diagnosed with recurrent cancer.  At his prior visit he also noted that he had had issues with atrial fibrillation and had required cardioversion.  He has not had any recurrent issues with this.  He follows with electrophysiology and has pacemaker in place as well.  As noted, he does not endorse any other respiratory issues or symptoms today.   Review of Systems A 10 point review of systems was performed and it is as noted above otherwise negative.  Patient Active Problem List   Diagnosis Date Noted   Secondary hypercoagulable state ([place]) 01/02/2020   Chronic diastolic heart failure ([place]) 11/22/2019   Paroxysmal atrial fibrillation ([place]) 09/22/2019   Atrial fibrillation with RVR ([place])    Lobar pneumonia ([place]) 09/21/2019   Acute respiratory failure with hypoxia ([place]) 09/21/2019   Acute on chronic respiratory failure ([place])  09/18/2019   Severe sepsis (HCC)    Elevated lactic acid level    GERD (gastroesophageal reflux disease)    Chronic bronchitis ([place]) 02/09/2019   CAP (community acquired pneumonia) 03/03/2018   Primary localized osteoarthritis of right knee 08/11/2017   Asthma with acute exacerbation 12/08/2016   Chest pain 10/20/2016   HTN (hypertension) 10/20/2016   Morbid obesity due to excess calories ([place]) 06/24/2016   Severe persistent asthma 02/12/2016   Upper airway cough syndrome 01/24/2016   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   Persistent atrial fibrillation    Syncope    Hyperthyroidism    Hyperlipidemia    RBBB (right bundle branch block)    Tricuspid valve disorder    Aortic valve disorder    Carotid artery stenosis    Type 2 diabetes mellitus ([place])    Pacemaker    OSA (obstructive sleep apnea)    Arthritis    Sinoatrial node dysfunction (HCC)    PVC's (premature ventricular contractions) 07/17/2011   Essential hypertension, benign 01/24/2011   Premature ventricular contractions 01/24/2011   PPM-[place] 01/24/2011   Social History   Tobacco Use   Smoking status: Former    Packs/day: 3.00    Years: 25.00    Pack years: 75.00    Types: Cigarettes    Start date: 02/20/1958    Quit date: 02/12/1984    Years since quitting: 37.8   Smokeless tobacco: Former    Types: Loss adjuster, chartered  Tobacco comments:    Former smoker 08/30/2021  Substance Use Topics   Alcohol use: Yes    Alcohol/week: 2.0 - 4.0 standard drinks    Types: 1 - 2 Cans of beer, 1 - 2 Glasses of wine per week   Allergies  Allergen Reactions   Food Anaphylaxis and Shortness Of Breath    TREE NUTS   Iodinated Diagnostic Agents Hives and Shortness Of Breath    Patient states hives to throat closing. (01/15/17: patient states this reaction was "about 20 years ago" with possibly an IVP.  He now says high doses of prednisone "throw me into AFib."  He has tolerated CT arthrograms with Benadrly 50mg  PO one hour before injection.  Brita Romp, RN)   Shellfish Allergy Anaphylaxis and Shortness Of Breath    To shellfish, crabs.  Makes him feel like "things are crawling all over" me.  Denies airway issues with these foods.  Brita Romp, RN 01/15/17)   Goat-Derived Products Hives    GOAT CHEESE    Prednisone Palpitations    PRECIPITATES A-FIB   Metformin And Related Diarrhea    High doses at once   Vancomycin Anxiety   Voltaren [Diclofenac Sodium] Other (See Comments)    Feels like things are crawling on him   Current Meds  Medication Sig   acetaminophen (TYLENOL) 500 MG tablet Take 1,000 mg by mouth 3 (three) times daily.   albuterol (PROVENTIL HFA;VENTOLIN HFA) 108 (90 Base) MCG/ACT inhaler Inhale 2 puffs every 4 (four) hours as needed into the lungs for shortness of breath (only if you can't catch your breath).   atorvastatin (LIPITOR) 20 MG tablet Take 20 mg by mouth at bedtime.   bisoprolol (ZEBETA) 5 MG tablet TAKE 1 AND 1/2 TABLET BY MOUTH TWICE DAILY.   Budeson-Glycopyrrol-Formoterol (BREZTRI AEROSPHERE) 160-9-4.8 MCG/ACT AERO Inhale 2 puffs into the lungs in the morning and at bedtime.   Carboxymethylcellul-Glycerin (LUBRICATING EYE DROPS OP) Place 1 drop into the right eye daily as needed (dry eyes).   diltiazem (CARDIZEM CD) 120 MG 24 hr capsule Take 1 capsule (120 mg total) by mouth daily. May take an extra tablet daily for breakthrough afib (Patient taking differently: Take 120 mg by mouth in the morning and at bedtime. May take an extra tablet daily for breakthrough afib)   diphenhydramine-acetaminophen (TYLENOL PM) 25-500 MG TABS tablet Take 1 tablet by mouth at bedtime as needed (sleep/pain).   EPINEPHrine 0.3 mg/0.3 mL IJ SOAJ injection Inject 0.3 mg into the muscle as needed for anaphylaxis.   flecainide (TAMBOCOR) 150 MG tablet TAKE 1/2 TABLET BY MOUTH TWICE DAILY.   furosemide (LASIX) 40 MG tablet TAKE 1 TABLET BY MOUTH DAILY   glipiZIDE (GLUCOTROL XL) 5 MG 24 hr tablet Take 5 mg by mouth daily before  breakfast.   JARDIANCE 25 MG TABS tablet Take 25 mg by mouth daily before breakfast.   levothyroxine (SYNTHROID, LEVOTHROID) 137 MCG tablet Take 137 mcg by mouth daily before breakfast.    losartan (COZAAR) 50 MG tablet Take 50 mg by mouth at bedtime.   metFORMIN (GLUCOPHAGE-XR) 500 MG 24 hr tablet Take 500 mg by mouth in the morning and at bedtime.   Multiple Vitamin (MULTIVITAMIN) tablet Take 1 tablet by mouth daily.   omalizumab Arvid Right) 150 MG/ML prefilled syringe Inject 300 mg into the skin every 28 (twenty-eight) days.   omeprazole (PRILOSEC OTC) 20 MG tablet Take 20 mg by mouth daily.    Respiratory Therapy Supplies (FLUTTER) DEVI Use  as directed   rivaroxaban (XARELTO) 20 MG TABS tablet Take 20 mg daily with supper by mouth.   Immunization History  Administered Date(s) Administered   Fluad Quad(high Dose 65+) 09/12/2019   Influenza Split 11/14/2015, 10/11/2018   Influenza, High Dose Seasonal PF 09/28/2017   Influenza-Unspecified 11/02/2016, 09/03/2020, 10/03/2021   Moderna SARS-COV2 Booster Vaccination 08/20/2020, 03/04/2021   Moderna Sars-Covid-2 Vaccination 02/15/2020, 03/14/2020   Pneumococcal Conjugate-13 09/28/2015   Tdap 11/28/2019       Objective:   Physical Exam BP 132/76 (BP Location: Left Arm, Cuff Size: Normal)    Pulse 91    Temp 97.6 F (36.4 C) (Temporal)    Ht 5\' 9"  (1.753 m)    Wt 230 lb (104.3 kg)    SpO2 90%    BMI 33.97 kg/m  GENERAL: Well-developed, obese patient in no acute distress.  He is comfortable on nasal cannula O2.  He is fully ambulatory. HEAD: Normocephalic, atraumatic.  EYES: Pupils equal, round, reactive to light.  No scleral icterus.  MOUTH: Nose/mouth/throat not examined due to masking requirements for COVID 19.   NECK: Supple. No thyromegaly. Trachea midline. No JVD.  No adenopathy. PULMONARY: Good air entry bilaterally.  Symmetrical, nonlabored, no adventitious sounds. CARDIOVASCULAR: S1 and S2. Regular rate and rhythm.  Grade 2/6 mitral  regurgitation murmur.  Pacemaker on left. GASTROINTESTINAL: Obese otherwise benign. MUSCULOSKELETAL: No joint deformity, no clubbing, no edema.  NEUROLOGIC: No focal deficits, speech is fluent.  No gait disturbance noted.  Awake, alert and oriented. SKIN: Intact,warm,dry. PSYCH: Mood and behavior appropriate.     Assessment & Plan:     ICD-10-CM   1. Asthma-COPD overlap syndrome (HCC)  J44.9    Continue Breztri 2 puffs twice a day Continue as needed albuterol Continue Xolair    2. Bronchiectasis without complication (HCC)  V42.5    Continue vest physiotherapy for airway clearance    3. Chronic respiratory failure with hypoxia (HCC)  J96.11    Continue oxygen at 3 L/min pulsed Oxygen bled in through CPAP nocturnally    4. OSA (obstructive sleep apnea)  G47.33    Continue CPAP Management per primary care May consider humidifying tubing     Patient appears to be well compensated from the pulmonary standpoint.  We will see him in follow-up in 3 months time he is to contact us prior to that time should any new difficulties arise.  Renold Don, MD Advanced Bronchoscopy PCCM [facility]    *This note was dictated using voice recognition software/Dragon.  Despite best efforts to proofread, errors can occur which can change the meaning. Any transcriptional errors that result from this process are unintentional and may not be fully corrected at the time of dictation.

## 2022-01-06 ENCOUNTER — Other Ambulatory Visit (HOSPITAL_COMMUNITY): Payer: Self-pay | Admitting: Physician Assistant

## 2022-01-06 ENCOUNTER — Ambulatory Visit (INDEPENDENT_AMBULATORY_CARE_PROVIDER_SITE_OTHER): Payer: Medicare Other

## 2022-01-06 DIAGNOSIS — I495 Sick sinus syndrome: Secondary | ICD-10-CM

## 2022-01-06 LAB — CUP PACEART REMOTE DEVICE CHECK
Battery Remaining Longevity: 6 mo
Battery Remaining Percentage: 5 %
Battery Voltage: 2.75 V
Brady Statistic AP VP Percent: 3.3 %
Brady Statistic AP VS Percent: 96 %
Brady Statistic AS VP Percent: 1 %
Brady Statistic AS VS Percent: 1 %
Brady Statistic RA Percent Paced: 99 %
Brady Statistic RV Percent Paced: 3.5 %
Date Time Interrogation Session: 20230109034107
Implantable Lead Implant Date: 20051106
Implantable Lead Implant Date: 20051106
Implantable Lead Location: 753859
Implantable Lead Location: 753860
Implantable Pulse Generator Implant Date: 20140115
Lead Channel Impedance Value: 350 Ohm
Lead Channel Impedance Value: 660 Ohm
Lead Channel Pacing Threshold Amplitude: 0.75 V
Lead Channel Pacing Threshold Amplitude: 1 V
Lead Channel Pacing Threshold Pulse Width: 0.4 ms
Lead Channel Pacing Threshold Pulse Width: 0.4 ms
Lead Channel Sensing Intrinsic Amplitude: 12 mV
Lead Channel Sensing Intrinsic Amplitude: 2.3 mV
Lead Channel Setting Pacing Amplitude: 2 V
Lead Channel Setting Pacing Amplitude: 2.5 V
Lead Channel Setting Pacing Pulse Width: 0.4 ms
Lead Channel Setting Sensing Sensitivity: 2 mV
Pulse Gen Model: 2210
Pulse Gen Serial Number: 7439597

## 2022-01-13 ENCOUNTER — Encounter: Payer: Self-pay | Admitting: Internal Medicine

## 2022-01-13 ENCOUNTER — Encounter: Payer: Self-pay | Admitting: Pulmonary Disease

## 2022-01-13 NOTE — Telephone Encounter (Signed)
Dr Patsey Berthold please advise:   Patient states the flight would be from [place] to [place].   We are considering on flying for vacation and I want to know if y'all have any concerns about me flying. Thank you in advance for your reply.

## 2022-01-13 NOTE — Telephone Encounter (Signed)
That is a short flight and he should be able to do okay and not need extra oxygen than what is prescribed.

## 2022-01-14 NOTE — Progress Notes (Signed)
Remote pacemaker transmission.   

## 2022-02-04 ENCOUNTER — Other Ambulatory Visit: Payer: Self-pay | Admitting: Internal Medicine

## 2022-02-05 ENCOUNTER — Ambulatory Visit (INDEPENDENT_AMBULATORY_CARE_PROVIDER_SITE_OTHER): Payer: Medicare Other

## 2022-02-05 DIAGNOSIS — I495 Sick sinus syndrome: Secondary | ICD-10-CM

## 2022-02-05 LAB — CUP PACEART REMOTE DEVICE CHECK
Battery Remaining Longevity: 5 mo
Battery Remaining Percentage: 4 %
Battery Voltage: 2.72 V
Brady Statistic AP VP Percent: 4.3 %
Brady Statistic AP VS Percent: 85 %
Brady Statistic AS VP Percent: 4.6 %
Brady Statistic AS VS Percent: 5.9 %
Brady Statistic RA Percent Paced: 89 %
Brady Statistic RV Percent Paced: 8.9 %
Date Time Interrogation Session: 20230208093903
Implantable Lead Implant Date: 20051106
Implantable Lead Implant Date: 20051106
Implantable Lead Location: 753859
Implantable Lead Location: 753860
Implantable Pulse Generator Implant Date: 20140115
Lead Channel Impedance Value: 350 Ohm
Lead Channel Impedance Value: 660 Ohm
Lead Channel Pacing Threshold Amplitude: 0.75 V
Lead Channel Pacing Threshold Amplitude: 1 V
Lead Channel Pacing Threshold Pulse Width: 0.4 ms
Lead Channel Pacing Threshold Pulse Width: 0.4 ms
Lead Channel Sensing Intrinsic Amplitude: 1 mV
Lead Channel Sensing Intrinsic Amplitude: 12 mV
Lead Channel Setting Pacing Amplitude: 2 V
Lead Channel Setting Pacing Amplitude: 2.5 V
Lead Channel Setting Pacing Pulse Width: 0.4 ms
Lead Channel Setting Sensing Sensitivity: 2 mV
Pulse Gen Model: 2210
Pulse Gen Serial Number: 7439597

## 2022-02-05 NOTE — Telephone Encounter (Signed)
Scheduled remote reviewed. Normal device function.   Presenting AF with controlled ventricular response, ongoing from 2/7 @ 22:19 Route to triage per protocol Known AF, burden 13%, Xarelto, Diltiazem, Flecainide prescribed.  Patient is asymptomatic with exception of 1 lower leg swelling. No other complaints per patient and compliant with medications on file.

## 2022-02-06 ENCOUNTER — Other Ambulatory Visit: Payer: Self-pay | Admitting: Pulmonary Disease

## 2022-02-07 NOTE — Telephone Encounter (Signed)
Called pt in regards to my chart message.  Pt reports took an extra dose of Cartia XT 120 mg tablet around 2 pm. Feels that he is no longer in Afib.  Latest HR today is 86 he has been outside in the yard and feels pretty good.  Advised pt to call back with any further concerns.

## 2022-02-10 NOTE — Progress Notes (Signed)
Remote pacemaker transmission.   

## 2022-03-10 ENCOUNTER — Ambulatory Visit (INDEPENDENT_AMBULATORY_CARE_PROVIDER_SITE_OTHER): Payer: Medicare Other

## 2022-03-10 DIAGNOSIS — I495 Sick sinus syndrome: Secondary | ICD-10-CM

## 2022-03-10 LAB — CUP PACEART REMOTE DEVICE CHECK
Battery Remaining Longevity: 4 mo
Battery Remaining Percentage: 3 %
Battery Voltage: 2.69 V
Brady Statistic AP VP Percent: 5.6 %
Brady Statistic AP VS Percent: 73 %
Brady Statistic AS VP Percent: 10 %
Brady Statistic AS VS Percent: 11 %
Brady Statistic RA Percent Paced: 78 %
Brady Statistic RV Percent Paced: 16 %
Date Time Interrogation Session: 20230312065811
Implantable Lead Implant Date: 20051106
Implantable Lead Implant Date: 20051106
Implantable Lead Location: 753859
Implantable Lead Location: 753860
Implantable Pulse Generator Implant Date: 20140115
Lead Channel Impedance Value: 330 Ohm
Lead Channel Impedance Value: 650 Ohm
Lead Channel Pacing Threshold Amplitude: 0.75 V
Lead Channel Pacing Threshold Amplitude: 1 V
Lead Channel Pacing Threshold Pulse Width: 0.4 ms
Lead Channel Pacing Threshold Pulse Width: 0.4 ms
Lead Channel Sensing Intrinsic Amplitude: 0.9 mV
Lead Channel Sensing Intrinsic Amplitude: 10.8 mV
Lead Channel Setting Pacing Amplitude: 2 V
Lead Channel Setting Pacing Amplitude: 2.5 V
Lead Channel Setting Pacing Pulse Width: 0.4 ms
Lead Channel Setting Sensing Sensitivity: 2 mV
Pulse Gen Model: 2210
Pulse Gen Serial Number: 7439597

## 2022-03-12 ENCOUNTER — Encounter: Payer: Self-pay | Admitting: Internal Medicine

## 2022-03-19 ENCOUNTER — Encounter: Payer: Medicare Other | Admitting: Internal Medicine

## 2022-03-20 ENCOUNTER — Ambulatory Visit: Payer: Medicare Other | Admitting: Pulmonary Disease

## 2022-03-20 ENCOUNTER — Encounter: Payer: Self-pay | Admitting: Pulmonary Disease

## 2022-03-20 ENCOUNTER — Other Ambulatory Visit: Payer: Self-pay

## 2022-03-20 VITALS — BP 126/86 | HR 90 | Temp 98.5°F | Ht 69.0 in | Wt 228.2 lb

## 2022-03-20 DIAGNOSIS — J479 Bronchiectasis, uncomplicated: Secondary | ICD-10-CM

## 2022-03-20 DIAGNOSIS — J9611 Chronic respiratory failure with hypoxia: Secondary | ICD-10-CM | POA: Diagnosis not present

## 2022-03-20 DIAGNOSIS — J449 Chronic obstructive pulmonary disease, unspecified: Secondary | ICD-10-CM | POA: Diagnosis not present

## 2022-03-20 DIAGNOSIS — J4489 Other specified chronic obstructive pulmonary disease: Secondary | ICD-10-CM

## 2022-03-20 NOTE — Patient Instructions (Signed)
Continue medicines as they are for now. ? ?We will see you in follow-up in 3 months time call sooner should any new problems arise. ?

## 2022-03-20 NOTE — Progress Notes (Signed)
Remote pacemaker transmission.   

## 2022-03-25 ENCOUNTER — Ambulatory Visit: Payer: Medicare Other | Admitting: Internal Medicine

## 2022-03-25 ENCOUNTER — Encounter: Payer: Self-pay | Admitting: Internal Medicine

## 2022-03-25 ENCOUNTER — Other Ambulatory Visit: Payer: Self-pay

## 2022-03-25 VITALS — BP 98/60 | HR 78 | Ht 69.0 in | Wt 230.0 lb

## 2022-03-25 DIAGNOSIS — I5032 Chronic diastolic (congestive) heart failure: Secondary | ICD-10-CM

## 2022-03-25 DIAGNOSIS — I495 Sick sinus syndrome: Secondary | ICD-10-CM | POA: Diagnosis not present

## 2022-03-25 DIAGNOSIS — I480 Paroxysmal atrial fibrillation: Secondary | ICD-10-CM

## 2022-03-25 DIAGNOSIS — Z950 Presence of cardiac pacemaker: Secondary | ICD-10-CM | POA: Diagnosis not present

## 2022-03-25 NOTE — Patient Instructions (Signed)
Medication Instructions:  ?Your physician recommends that you continue on your current medications as directed. Please refer to the Current Medication list given to you today. ? ?Labwork: ?None ordered. ? ?Testing/Procedures: ?None ordered. ? ?Follow-Up: ?Your physician wants you to follow-up in: one year with Cristopher Peru, MD or one of the following Advanced Practice Providers on your designated Care Team:   ?Tommye Standard, PA-C ?Legrand Como "Jonni Sanger" Hooper, PA-C ? ?Remote monitoring is used to monitor your Pacemaker from home. This monitoring reduces the number of office visits required to check your device to one time per year. It allows [place] to keep an eye on the functioning of your device to ensure it is working properly. You are scheduled for a device check from home on 04/10/2022. You may send your transmission at any time that day. If you have a wireless device, the transmission will be sent automatically. After your physician reviews your transmission, you will receive a postcard with your next transmission date. ? ?Any Other Special Instructions Will Be Listed Below (If Applicable). ? ?If you need a refill on your cardiac medications before your next appointment, please call your pharmacy.  ? ? ? ? ?

## 2022-03-25 NOTE — Progress Notes (Signed)
? ? ? ? ?HPI ?Clarence Dawson returns today for followup. He is a pleasant 76 yo man with a h/o sinus node dysfunction, s/p PPM insertion, and PAF. He had sepsis and developed recurrent atrial fib. He has not had any bleeding. On flecainide, he had maintained NSR but has recently developed atrial fib for the past 3 months. He may be a little more sob. No significant palpitations. He is recently saddened by the loss of his wife who died after a long battle with ovarian CA. He is sad.  ?Allergies  ?Allergen Reactions  ? Food Anaphylaxis and Shortness Of Breath  ?  TREE NUTS  ? Iodinated Contrast Media Hives and Shortness Of Breath  ?  Patient states hives to throat closing. (01/15/17: patient states this reaction was "about 20 years ago" with possibly an IVP.  He now says high doses of prednisone "throw me into AFib."  He has tolerated CT arthrograms with Benadrly 59m PO one hour before injection.  JBrita Romp RN)  ? Shellfish Allergy Anaphylaxis and Shortness Of Breath  ?  To shellfish, crabs.  Makes him feel like "things are crawling all over" me.  Denies airway issues with these foods.  (Brita Romp RN 01/15/17)  ? Goat-Derived Products Hives  ?  GOAT CHEESE ?  ? Prednisone Palpitations  ?  PRECIPITATES A-FIB  ? Metformin And Related Diarrhea  ?  High doses at once  ? Vancomycin Anxiety  ? Voltaren [Diclofenac Sodium] Other (See Comments)  ?  Feels like things are crawling on him  ? ? ? ?Current Outpatient Medications  ?Medication Sig Dispense Refill  ? acetaminophen (TYLENOL) 500 MG tablet Take 1,000 mg by mouth 3 (three) times daily.    ? albuterol (PROVENTIL HFA;VENTOLIN HFA) 108 (90 Base) MCG/ACT inhaler Inhale 2 puffs every 4 (four) hours as needed into the lungs for shortness of breath (only if you can't catch your breath).    ? atorvastatin (LIPITOR) 20 MG tablet Take 20 mg by mouth at bedtime.    ? bisoprolol (ZEBETA) 5 MG tablet TAKE 1 AND 1/2 TABLET BY MOUTH TWICE DAILY. 90 tablet 3  ?  Budeson-Glycopyrrol-Formoterol (BREZTRI AEROSPHERE) 160-9-4.8 MCG/ACT AERO Inhale 2 puffs into the lungs in the morning and at bedtime. 10.7 g 11  ? Carboxymethylcellul-Glycerin (LUBRICATING EYE DROPS OP) Place 1 drop into the right eye daily as needed (dry eyes).    ? CARTIA XT 120 MG 24 hr capsule TAKE (1) CAPSULE BY MOUTH DAILY, MAY TAKE A EXTRA CAPSULE DAILY FOR BREAKTHROUGH AFIB. 180 capsule 2  ? diphenhydramine-acetaminophen (TYLENOL PM) 25-500 MG TABS tablet Take 1 tablet by mouth at bedtime as needed (sleep/pain).    ? EPINEPHrine 0.3 mg/0.3 mL IJ SOAJ injection Inject 0.3 mg into the muscle as needed for anaphylaxis. 1 each 5  ? flecainide (TAMBOCOR) 150 MG tablet TAKE 1/2 TABLET BY MOUTH TWICE DAILY. 90 tablet 2  ? furosemide (LASIX) 40 MG tablet TAKE 1 TABLET BY MOUTH DAILY 90 tablet 3  ? glipiZIDE (GLUCOTROL XL) 5 MG 24 hr tablet Take 5 mg by mouth daily before breakfast.    ? JARDIANCE 25 MG TABS tablet Take 25 mg by mouth daily before breakfast.    ? levothyroxine (SYNTHROID, LEVOTHROID) 137 MCG tablet Take 137 mcg by mouth daily before breakfast.     ? losartan (COZAAR) 50 MG tablet Take 50 mg by mouth at bedtime.    ? metFORMIN (GLUCOPHAGE-XR) 500 MG 24 hr tablet Take 500 mg by  mouth in the morning and at bedtime.    ? Multiple Vitamin (MULTIVITAMIN) tablet Take 1 tablet by mouth daily.    ? omeprazole (PRILOSEC OTC) 20 MG tablet Take 20 mg by mouth daily.     ? Respiratory Therapy Supplies (FLUTTER) DEVI Use as directed 1 each 0  ? rivaroxaban (XARELTO) 20 MG TABS tablet Take 20 mg daily with supper by mouth.    ? XOLAIR 150 MG/ML prefilled syringe INJECT 300MG SUBCUTANEOUSLY EVERY 4 WEEKS 2 mL 11  ? ?No current facility-administered medications for this visit.  ? ? ? ?Past Medical History:  ?Diagnosis Date  ? Allergic rhinitis   ? Aortic valve disorder   ? Asthma   ? since childhood- seasonal allergies induced  ? Cancer [facility])   ? Skin cancer- squamous, basal  ? Carotid artery stenosis   ? Essential  hypertension   ? Full dentures   ? GERD (gastroesophageal reflux disease)   ? H/O hiatal hernia   ? Hemorrhage of rectum   ? Hyperlipidemia   ? Hypothyroidism   ? Male circumcision   ? OSA (obstructive sleep apnea)   ? Osteoarthritis   ? Pacemaker   ? Oct 2005 in [place].  ? PAF (paroxysmal atrial fibrillation) ([place])   ? Pneumonia   ? "several Times" 2015 last time  ? Primary localized osteoarthritis of right knee 08/11/2017  ? RBBB (right bundle branch block)   ? Sinoatrial node dysfunction (HCC)   ? Syncope   ? Tricuspid valve disorder   ? Type 2 diabetes mellitus ([place])   ? Type II  ? ? ?ROS: ? ? All systems reviewed and negative except as noted in the HPI. ? ? ?Past Surgical History:  ?Procedure Laterality Date  ? A-V CARDIAC PACEMAKER INSERTION    ? Sick sinus syndrome DDR pacer  ? Arthropathy Right 2005  ? Rebuilding of left thumb and joint   ? CARDIAC CATHETERIZATION    ? CARDIAC ELECTROPHYSIOLOGY STUDY AND ABLATION  09/2008  ? for pvcs, Dr. Loralie Champagne  ? CARDIOVERSION N/A 12/18/2016  ? Procedure: CARDIOVERSION;  Surgeon: Pixie Casino, MD;  Location: [place];  Service: Cardiovascular;  Laterality: N/A;  ? CARDIOVERSION N/A 09/05/2021  ? Procedure: CARDIOVERSION;  Surgeon: Geralynn Rile, MD;  Location: [place];  Service: Cardiovascular;  Laterality: N/A;  ? [place]  ? right wrist  ? CARPAL TUNNEL RELEASE  05/04/2012  ? Procedure: CARPAL TUNNEL RELEASE;  Surgeon: Wynonia Sours, MD;  Location: [place];  Service: Orthopedics;  Laterality: Left;  ? CARPOMETACARPEL SUSPENSION PLASTY Right 11/16/2014  ? Procedure: SUSPENSIONPLASTY RIGHT THUMB TENDON TRANSFER ABDUCTOR POLLICUS LONGUS EXCISION TRAPEZIUM;  Surgeon: Daryll Brod, MD;  Location: [place];  Service: Orthopedics;  Laterality: Right;  ? CHOLECYSTECTOMY  1994  ? CIRCUMCISION    ? COLONOSCOPY N/A 03/14/2013  ? Procedure: COLONOSCOPY;  Surgeon: Daneil Dolin, MD;  [facility];   Service: Endoscopy;  Laterality: N/A;  8:15 AM  ? EYE SURGERY    ? 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 EYE SURGERY  2012  ? Left eye Corneal transplant- partial- Cataract  ? FLEXIBLE BRONCHOSCOPY Right 06/23/2019  ? Procedure: FLEXIBLE BRONCHOSCOPY RIGHT;  Surgeon: [name], MD;  Location: [facility] OR[facility];  Service: Pulmonary;  Laterality: Right;  ? GALLBLADDER SURGERY  12/01/2006  ? HAND TENDON SURGERY Left late 1990's  ? thumb  ? HEMORROIDECTOMY  2003  ? INJECTION KNEE Left 08/26/2017  ?  Procedure: LEFT KNEE INJECTION;  Surgeon: Elsie Saas, MD;  Location: [place];  Service: Orthopedics;  Laterality: Left;  ? left Knee Arthroscopy    ? [facility]  ? PARTIAL KNEE ARTHROPLASTY  11/22/2012  ? Procedure: UNICOMPARTMENTAL KNEE;  Surgeon: Lorn Junes, MD;  Location: [place];  Service: Orthopedics;  Laterality: Left;  left unicompartmental knee arthroplasty  ? PERMANENT PACEMAKER GENERATOR CHANGE N/A 01/12/2013  ? Procedure: PERMANENT PACEMAKER GENERATOR CHANGE;  Surgeon: Evans Lance, MD;  Location: [facility] CATH LAB;  Service: Cardiovascular;  Laterality: N/A;  ? Rotator cuff Surgery  2001  ? Right shoulder  ? TONSILLECTOMY    ? TOTAL KNEE ARTHROPLASTY Right 08/26/2017  ? Procedure: TOTAL KNEE ARTHROPLASTY;  Surgeon: Elsie Saas, MD;  Location: [place];  Service: Orthopedics;  Laterality: Right;  ? varicose vein reduction    ? ? ? ?Family History  ?Problem Relation Age of Onset  ? Other Father 4  ?     Sudden Cardiac death  ? Pancreatic cancer Mother   ? Colon cancer Mother   ? Colon cancer Maternal Aunt   ? Colon polyps Neg Hx   ? ? ? ?Social History  ? ?Socioeconomic History  ? Marital status: Married  ?  Spouse name: Not on file  ? Number of children: Not on file  ? Years of education: Not on file  ? Highest education level: Not on file  ?Occupational History  ? Not on file  ?Tobacco Use  ? Smoking status: Former  ?  Packs/day: 3.00  ?  Years: 25.00  ?  Pack years: 75.00  ?  Types:  Cigarettes  ?  Start date: 02/20/1958  ?  Quit date: 02/12/1984  ?  Years since quitting: 38.1  ? Smokeless tobacco: Former  ?  Types: Chew  ? Tobacco comments:  ?  Former smoker 08/30/2021  ?Vaping Use  ? Vaping Use: Never used

## 2022-04-09 LAB — CUP PACEART REMOTE DEVICE CHECK
Battery Remaining Longevity: 1 mo
Battery Remaining Percentage: 2 %
Battery Voltage: 2.66 V
Brady Statistic AP VP Percent: 7.1 %
Brady Statistic AP VS Percent: 63 %
Brady Statistic AS VP Percent: 16 %
Brady Statistic AS VS Percent: 14 %
Brady Statistic RA Percent Paced: 70 %
Brady Statistic RV Percent Paced: 23 %
Date Time Interrogation Session: 20230412030932
Implantable Lead Implant Date: 20051106
Implantable Lead Implant Date: 20051106
Implantable Lead Location: 753859
Implantable Lead Location: 753860
Implantable Pulse Generator Implant Date: 20140115
Lead Channel Impedance Value: 330 Ohm
Lead Channel Impedance Value: 650 Ohm
Lead Channel Pacing Threshold Amplitude: 0.75 V
Lead Channel Pacing Threshold Amplitude: 0.75 V
Lead Channel Pacing Threshold Pulse Width: 0.4 ms
Lead Channel Pacing Threshold Pulse Width: 0.4 ms
Lead Channel Sensing Intrinsic Amplitude: 1.2 mV
Lead Channel Sensing Intrinsic Amplitude: 10.5 mV
Lead Channel Setting Pacing Amplitude: 2 V
Lead Channel Setting Pacing Amplitude: 2.5 V
Lead Channel Setting Pacing Pulse Width: 0.4 ms
Lead Channel Setting Sensing Sensitivity: 2 mV
Pulse Gen Model: 2210
Pulse Gen Serial Number: 7439597

## 2022-04-10 ENCOUNTER — Ambulatory Visit (INDEPENDENT_AMBULATORY_CARE_PROVIDER_SITE_OTHER): Payer: Medicare Other

## 2022-04-10 DIAGNOSIS — I495 Sick sinus syndrome: Secondary | ICD-10-CM

## 2022-04-16 LAB — CUP PACEART INCLINIC DEVICE CHECK
Battery Remaining Longevity: 3 mo
Battery Voltage: 2.68 V
Brady Statistic RA Percent Paced: 74 %
Brady Statistic RV Percent Paced: 20 %
Date Time Interrogation Session: 20230328164100
Implantable Lead Implant Date: 20051106
Implantable Lead Implant Date: 20051106
Implantable Lead Location: 753859
Implantable Lead Location: 753860
Implantable Pulse Generator Implant Date: 20140115
Lead Channel Impedance Value: 325 Ohm
Lead Channel Impedance Value: 637.5 Ohm
Lead Channel Pacing Threshold Amplitude: 0.75 V
Lead Channel Pacing Threshold Amplitude: 0.75 V
Lead Channel Pacing Threshold Pulse Width: 0.4 ms
Lead Channel Pacing Threshold Pulse Width: 0.4 ms
Lead Channel Sensing Intrinsic Amplitude: 0.7 mV
Lead Channel Sensing Intrinsic Amplitude: 12 mV
Lead Channel Setting Pacing Amplitude: 2 V
Lead Channel Setting Pacing Amplitude: 2.5 V
Lead Channel Setting Pacing Pulse Width: 0.4 ms
Lead Channel Setting Sensing Sensitivity: 2 mV
Pulse Gen Model: 2210
Pulse Gen Serial Number: 7439597

## 2022-04-25 NOTE — Progress Notes (Signed)
Remote pacemaker transmission.   

## 2022-05-07 ENCOUNTER — Ambulatory Visit (INDEPENDENT_AMBULATORY_CARE_PROVIDER_SITE_OTHER): Payer: Medicare Other

## 2022-05-07 DIAGNOSIS — I495 Sick sinus syndrome: Secondary | ICD-10-CM | POA: Diagnosis not present

## 2022-05-07 LAB — CUP PACEART REMOTE DEVICE CHECK
Battery Remaining Longevity: 1 mo
Battery Remaining Percentage: 1 %
Battery Voltage: 2.63 V
Brady Statistic AP VP Percent: 8.2 %
Brady Statistic AP VS Percent: 56 %
Brady Statistic AS VP Percent: 19 %
Brady Statistic AS VS Percent: 16 %
Brady Statistic RA Percent Paced: 65 %
Brady Statistic RV Percent Paced: 28 %
Date Time Interrogation Session: 20230510104152
Implantable Lead Implant Date: 20051106
Implantable Lead Implant Date: 20051106
Implantable Lead Location: 753859
Implantable Lead Location: 753860
Implantable Pulse Generator Implant Date: 20140115
Lead Channel Impedance Value: 340 Ohm
Lead Channel Impedance Value: 650 Ohm
Lead Channel Pacing Threshold Amplitude: 0.75 V
Lead Channel Pacing Threshold Amplitude: 0.75 V
Lead Channel Pacing Threshold Pulse Width: 0.4 ms
Lead Channel Pacing Threshold Pulse Width: 0.4 ms
Lead Channel Sensing Intrinsic Amplitude: 1.4 mV
Lead Channel Sensing Intrinsic Amplitude: 12 mV
Lead Channel Setting Pacing Amplitude: 2 V
Lead Channel Setting Pacing Amplitude: 2.5 V
Lead Channel Setting Pacing Pulse Width: 0.4 ms
Lead Channel Setting Sensing Sensitivity: 2 mV
Pulse Gen Model: 2210
Pulse Gen Serial Number: 7439597

## 2022-05-15 NOTE — Progress Notes (Signed)
Remote pacemaker transmission.   

## 2022-06-11 LAB — CUP PACEART REMOTE DEVICE CHECK
Battery Remaining Longevity: 1 mo
Battery Remaining Percentage: 0.5 %
Battery Voltage: 2.59 V
Brady Statistic AP VP Percent: 9.4 %
Brady Statistic AP VS Percent: 51 %
Brady Statistic AS VP Percent: 22 %
Brady Statistic AS VS Percent: 18 %
Brady Statistic RA Percent Paced: 60 %
Brady Statistic RV Percent Paced: 32 %
Date Time Interrogation Session: 20230613033218
Implantable Lead Implant Date: 20051106
Implantable Lead Implant Date: 20051106
Implantable Lead Location: 753859
Implantable Lead Location: 753860
Implantable Pulse Generator Implant Date: 20140115
Lead Channel Impedance Value: 350 Ohm
Lead Channel Impedance Value: 650 Ohm
Lead Channel Pacing Threshold Amplitude: 0.75 V
Lead Channel Pacing Threshold Amplitude: 0.75 V
Lead Channel Pacing Threshold Pulse Width: 0.4 ms
Lead Channel Pacing Threshold Pulse Width: 0.4 ms
Lead Channel Sensing Intrinsic Amplitude: 0.9 mV
Lead Channel Sensing Intrinsic Amplitude: 11.7 mV
Lead Channel Setting Pacing Amplitude: 2 V
Lead Channel Setting Pacing Amplitude: 2.5 V
Lead Channel Setting Pacing Pulse Width: 0.4 ms
Lead Channel Setting Sensing Sensitivity: 2 mV
Pulse Gen Model: 2210
Pulse Gen Serial Number: 7439597

## 2022-06-12 ENCOUNTER — Ambulatory Visit (INDEPENDENT_AMBULATORY_CARE_PROVIDER_SITE_OTHER): Payer: Medicare Other

## 2022-06-12 ENCOUNTER — Telehealth: Payer: Self-pay

## 2022-06-12 DIAGNOSIS — I495 Sick sinus syndrome: Secondary | ICD-10-CM

## 2022-06-12 NOTE — Progress Notes (Unsigned)
Electrophysiology Office Note Date: 06/17/2022  ID:  Clarence Dawson, Clarence Dawson 12/13/1946, MRN [id number]  PCP: [name], MD Primary Cardiologist: Cristopher Peru, MD Electrophysiologist: Cristopher Peru, MD   CC: Pacemaker follow-up  Clarence Dawson is a 76 y.o. male seen today for Cristopher Peru, MD for routine electrophysiology followup.  Since last being seen in our clinic the patient reports doing very well.  he denies chest pain, palpitations, dyspnea, PND, orthopnea, nausea, vomiting, dizziness, syncope, edema, weight gain, or early satiety.  Device History: [employer] PPM implanted 2005, gen change 2014 for SSS/Tachy-brady  Past Medical History:  Diagnosis Date   Allergic rhinitis    Aortic valve disorder    Asthma    since childhood- seasonal allergies induced   Cancer ([place])    Skin cancer- squamous, basal   Carotid artery stenosis    Essential hypertension    Full dentures    GERD (gastroesophageal reflux disease)    H/O hiatal hernia    Hemorrhage of rectum    Hyperlipidemia    Hypothyroidism    Male circumcision    OSA (obstructive sleep apnea)    Osteoarthritis    Pacemaker    Oct 2005 in [place].   PAF (paroxysmal atrial fibrillation) ([place])    Pneumonia    "several Times" 2015 last time   Primary localized osteoarthritis of right knee 08/11/2017   RBBB (right bundle branch block)    Sinoatrial node dysfunction (HCC)    Syncope    Tricuspid valve disorder    Type 2 diabetes mellitus ([place])    Type II   Past Surgical History:  Procedure Laterality Date   A-V CARDIAC PACEMAKER INSERTION     Sick sinus syndrome DDR pacer   Arthropathy Right 2005   Rebuilding of left thumb and joint    CARDIAC CATHETERIZATION     CARDIAC ELECTROPHYSIOLOGY STUDY AND ABLATION  09/2008   for pvcs, Dr. Loralie Champagne   CARDIOVERSION N/A 12/18/2016   Procedure: CARDIOVERSION;  Surgeon: Pixie Casino, MD;  Location: [facility] ENDOSCOPY;  Service: Cardiovascular;  Laterality: N/A;    CARDIOVERSION N/A 09/05/2021   Procedure: CARDIOVERSION;  Surgeon: Geralynn Rile, MD;  Location: [place];  Service: Cardiovascular;  Laterality: N/A;   [place]   right wrist   CARPAL TUNNEL RELEASE  05/04/2012   Procedure: CARPAL TUNNEL RELEASE;  Surgeon: Wynonia Sours, MD;  Location: [place];  Service: Orthopedics;  Laterality: Left;   CARPOMETACARPEL SUSPENSION PLASTY Right 11/16/2014   Procedure: SUSPENSIONPLASTY RIGHT THUMB TENDON TRANSFER ABDUCTOR POLLICUS LONGUS EXCISION TRAPEZIUM;  Surgeon: Daryll Brod, MD;  Location: [place];  Service: Orthopedics;  Laterality: Right;   CHOLECYSTECTOMY  1994   CIRCUMCISION     COLONOSCOPY N/A 03/14/2013   Procedure: COLONOSCOPY;  Surgeon: Daneil Dolin, MD;  [facility];  Service: Endoscopy;  Laterality: N/A;  8:15 AM   EYE SURGERY     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[facility]   EYE SURGERY  2012   Left eye Corneal transplant- partial- Cataract   FLEXIBLE BRONCHOSCOPY Right 06/23/2019   Procedure: FLEXIBLE BRONCHOSCOPY RIGHT;  Surgeon: [name], MD;  Location: [facility] OR[facility];  Service: Pulmonary;  Laterality: Right;   GALLBLADDER SURGERY  12/01/2006   HAND TENDON SURGERY Left late 1990's   thumb   HEMORROIDECTOMY  2003   INJECTION KNEE Left 08/26/2017   Procedure: LEFT KNEE INJECTION;  Surgeon: Elsie Saas, MD;  [facility]  OR;  Service: Orthopedics;  Laterality: Left;   left Knee Arthroscopy     April 21 2011- Day Surgery center   PARTIAL KNEE ARTHROPLASTY  11/22/2012   Procedure: UNICOMPARTMENTAL KNEE;  Surgeon: Lorn Junes, MD;  Location: [place];  Service: Orthopedics;  Laterality: Left;  left unicompartmental knee arthroplasty   PERMANENT PACEMAKER GENERATOR CHANGE N/A 01/12/2013   Procedure: PERMANENT PACEMAKER GENERATOR CHANGE;  Surgeon: Evans Lance, MD;  Location: [facility] CATH LAB;  Service: Cardiovascular;  Laterality: N/A;   Rotator cuff Surgery  2001    Right shoulder   TONSILLECTOMY     TOTAL KNEE ARTHROPLASTY Right 08/26/2017   Procedure: TOTAL KNEE ARTHROPLASTY;  Surgeon: Elsie Saas, MD;  Location: [place];  Service: Orthopedics;  Laterality: Right;   varicose vein reduction      Current Outpatient Medications  Medication Sig Dispense Refill   acetaminophen (TYLENOL) 500 MG tablet Take 1,000 mg by mouth 3 (three) times daily.     albuterol (PROVENTIL HFA;VENTOLIN HFA) 108 (90 Base) MCG/ACT inhaler Inhale 2 puffs every 4 (four) hours as needed into the lungs for shortness of breath (only if you can't catch your breath).     atorvastatin (LIPITOR) 20 MG tablet Take 20 mg by mouth at bedtime.     bisoprolol (ZEBETA) 5 MG tablet TAKE 1 AND 1/2 TABLET BY MOUTH TWICE DAILY. 90 tablet 3   Budeson-Glycopyrrol-Formoterol (BREZTRI AEROSPHERE) 160-9-4.8 MCG/ACT AERO Inhale 2 puffs into the lungs in the morning and at bedtime. 10.7 g 11   Carboxymethylcellul-Glycerin (LUBRICATING EYE DROPS OP) Place 1 drop into the right eye daily as needed (dry eyes).     CARTIA XT 120 MG 24 hr capsule TAKE (1) CAPSULE BY MOUTH DAILY, MAY TAKE A EXTRA CAPSULE DAILY FOR BREAKTHROUGH AFIB. 180 capsule 2   diphenhydramine-acetaminophen (TYLENOL PM) 25-500 MG TABS tablet Take 1 tablet by mouth at bedtime as needed (sleep/pain).     EPINEPHrine 0.3 mg/0.3 mL IJ SOAJ injection Inject 0.3 mg into the muscle as needed for anaphylaxis. 1 each 5   flecainide (TAMBOCOR) 150 MG tablet TAKE 1/2 TABLET BY MOUTH TWICE DAILY. 90 tablet 2   furosemide (LASIX) 40 MG tablet TAKE 1 TABLET BY MOUTH DAILY 90 tablet 3   glipiZIDE (GLUCOTROL XL) 5 MG 24 hr tablet Take 5 mg by mouth daily before breakfast.     JARDIANCE 25 MG TABS tablet Take 25 mg by mouth daily before breakfast.     levothyroxine (SYNTHROID, LEVOTHROID) 137 MCG tablet Take 137 mcg by mouth daily before breakfast.      losartan (COZAAR) 50 MG tablet Take 50 mg by mouth at bedtime.     metFORMIN (GLUCOPHAGE-XR) 500 MG 24  hr tablet Take 500 mg by mouth in the morning and at bedtime.     Multiple Vitamin (MULTIVITAMIN) tablet Take 1 tablet by mouth daily.     omeprazole (PRILOSEC OTC) 20 MG tablet Take 20 mg by mouth daily.      Respiratory Therapy Supplies (FLUTTER) DEVI Use as directed 1 each 0   rivaroxaban (XARELTO) 20 MG TABS tablet Take 20 mg daily with supper by mouth.     XOLAIR 150 MG/ML prefilled syringe INJECT 300MG SUBCUTANEOUSLY EVERY 4 WEEKS 2 mL 11   No current facility-administered medications for this visit.    Allergies:   Food, Iodinated contrast media, Shellfish allergy, Goat-derived products, Prednisone, Metformin and related, Vancomycin, and Voltaren [diclofenac sodium]   Social History: Social History   Socioeconomic  History   Marital status: Widowed    Spouse name: Not on file   Number of children: Not on file   Years of education: Not on file   Highest education level: Not on file  Occupational History   Not on file  Tobacco Use   Smoking status: Former    Packs/day: 3.00    Years: 25.00    Total pack years: 75.00    Types: Cigarettes    Start date: 02/20/1958    Quit date: 02/12/1984    Years since quitting: 38.3   Smokeless tobacco: Former    Types: Chew   Tobacco comments:    Former smoker 08/30/2021  Vaping Use   Vaping Use: Never used  Substance and Sexual Activity   Alcohol use: Yes    Alcohol/week: 2.0 - 4.0 standard drinks of alcohol    Types: 1 - 2 Cans of beer, 1 - 2 Glasses of wine per week   Drug use: No   Sexual activity: Not on file  Other Topics Concern   Not on file  Social History Narrative   Regular exercise: No   Social Determinants of Health   Financial Resource Strain: Not on file  Food Insecurity: Not on file  Transportation Needs: Not on file  Physical Activity: Not on file  Stress: Not on file  Social Connections: Not on file  Intimate Partner Violence: Not on file    Family History: Family History  Problem Relation Age of  Onset   Other Father 37       Sudden Cardiac death   Pancreatic cancer Mother    Colon cancer Mother    Colon cancer Maternal Aunt    Colon polyps Neg Hx      Review of Systems: All other systems reviewed and are otherwise negative except as noted above.  Physical Exam: Vitals:   06/17/22 1036  BP: 130/78  Pulse: 92  SpO2: 93%  Weight: 238 lb 12.8 oz (108.3 kg)  Height: _0  (1.753 m)     GEN- The patient is well appearing, alert and oriented x 3 today.   HEENT: normocephalic, atraumatic; sclera clear, conjunctiva pink; hearing intact; oropharynx clear; neck supple  Lungs- Clear to ausculation bilaterally, normal work of breathing.  No wheezes, rales, rhonchi Heart- Regular rate and rhythm, no murmurs, rubs or gallops  GI- soft, non-tender, non-distended, bowel sounds present  Extremities- no clubbing or cyanosis. No edema MS- no significant deformity or atrophy Skin- warm and dry, no rash or lesion; PPM pocket well healed Psych- euthymic mood, full affect Neuro- strength and sensation are intact  PPM Interrogation- reviewed in detail today,  See PACEART report  EKG:  EKG is ordered today. Personal review of ekg ordered today  shows AF in 90s with occasional VPacing   Recent Labs: 08/30/2021: BUN 19; Creatinine, Ser 1.01; Hemoglobin 16.5; Platelets 205; Potassium 4.3; Sodium 139   Wt Readings from Last 3 Encounters:  06/17/22 238 lb 12.8 oz (108.3 kg)  03/25/22 230 lb (104.3 kg)  03/20/22 228 lb 3.2 oz (103.5 kg)     Other studies Reviewed: Additional studies/ records that were reviewed today include: Previous EP office notes, Previous remote checks, Most recent labwork.   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 Persistent atrial fibrillation On Flecainide. Persistent since Feb/March. Consider [place] post gen change vs rate control  alone, then could  potentially stop flecainide.  Hold Xarelto 2 doses prior to gen change.  Otherwise he is tolerating well.    Current medicines are reviewed at length with the patient today.    Labs/ tests ordered today include:  Orders Placed This Encounter  Procedures   Basic metabolic panel   CBC   CUP PACEART INCLINIC DEVICE CHECK   EKG 12-Lead    Disposition:   Follow up with Dr. Lovena Le in as usual post gen change    Signed, Shirley Friar, PA-C  06/17/2022 11:06 AM  [address] [place]  [place] [address] [phone] (office) [phone] (fax)

## 2022-06-12 NOTE — Telephone Encounter (Signed)
Scheduled remote reviewed. Normal device function.   The device has triggered the elective replacement indicator on 06/10/2022, sent to triage Known AF on [facility] according to previous reports Next remote 07/14/2022.  Unsuccessful telephone encounter to patient to discuss battery voltage and near ERI status. [employer] compliant VM message left requesting call back to discuss. Patient has not triggered ERI to date. Patient will need OV to discuss gen change as it was not discussed at last OV 03/25/22 per documentation.

## 2022-06-12 NOTE — Telephone Encounter (Signed)
I let the patient know that he has reached ERI and got him scheduled with Jonni Sanger on 06/17/22 at 10:40 am to discussed gen change.

## 2022-06-13 NOTE — Telephone Encounter (Signed)
Patient called advised device has reached ERI based on voltage. Patient was aware of has apt with Jonni Sanger 06/17/22. Pt aware of apt.

## 2022-06-17 ENCOUNTER — Encounter: Payer: Self-pay | Admitting: Student

## 2022-06-17 ENCOUNTER — Ambulatory Visit: Payer: Medicare Other | Admitting: Student

## 2022-06-17 VITALS — BP 130/78 | HR 92 | Ht 69.0 in | Wt 238.8 lb

## 2022-06-17 DIAGNOSIS — I4819 Other persistent atrial fibrillation: Secondary | ICD-10-CM

## 2022-06-17 DIAGNOSIS — I495 Sick sinus syndrome: Secondary | ICD-10-CM

## 2022-06-17 LAB — CUP PACEART INCLINIC DEVICE CHECK
Battery Remaining Longevity: 0 mo
Battery Voltage: 2.59 V
Brady Statistic RA Percent Paced: 60 %
Brady Statistic RV Percent Paced: 33 %
Date Time Interrogation Session: 20230620110540
Implantable Lead Implant Date: 20051106
Implantable Lead Implant Date: 20051106
Implantable Lead Location: 753859
Implantable Lead Location: 753860
Implantable Pulse Generator Implant Date: 20140115
Lead Channel Impedance Value: 325 Ohm
Lead Channel Impedance Value: 637.5 Ohm
Lead Channel Pacing Threshold Amplitude: 0.75 V
Lead Channel Pacing Threshold Amplitude: 0.75 V
Lead Channel Pacing Threshold Pulse Width: 0.4 ms
Lead Channel Pacing Threshold Pulse Width: 0.4 ms
Lead Channel Sensing Intrinsic Amplitude: 0.8 mV
Lead Channel Sensing Intrinsic Amplitude: 10.2 mV
Lead Channel Setting Pacing Amplitude: 2 V
Lead Channel Setting Pacing Amplitude: 2.5 V
Lead Channel Setting Pacing Pulse Width: 0.4 ms
Lead Channel Setting Sensing Sensitivity: 2 mV
Pulse Gen Model: 2210
Pulse Gen Serial Number: 7439597

## 2022-06-17 NOTE — Patient Instructions (Addendum)
Medication Instructions:  Your physician recommends that you continue on your current medications as directed. Please refer to the Current Medication list given to you today.  *If you need a refill on your cardiac medications before your next appointment, please call your pharmacy*   Lab Work: TODAY: BMET, CBC  If you have labs (blood work) drawn today and your tests are completely normal, you will receive your results only by: [place] (if you have MyChart) OR A paper copy in the mail If you have any lab test that is abnormal or we need to change your treatment, we will call you to review the results.   Follow-Up: At [facility], you and your health needs are our priority.  As part of our continuing mission to provide you with exceptional heart care, we have created designated Provider Care Teams.  These Care Teams include your primary Cardiologist (physician) and Advanced Practice Providers (APPs -  Physician Assistants and Nurse Practitioners) who all work together to provide you with the care you need, when you need it.   Your next appointment:   We will call you to schedule  Other Instructions See letter for Instructions

## 2022-06-18 ENCOUNTER — Other Ambulatory Visit: Payer: Self-pay

## 2022-06-18 DIAGNOSIS — I4819 Other persistent atrial fibrillation: Secondary | ICD-10-CM

## 2022-06-18 LAB — BASIC METABOLIC PANEL
BUN/Creatinine Ratio: 21 (ref 10–24)
BUN: 21 mg/dL (ref 8–27)
CO2: 26 mmol/L (ref 20–29)
Calcium: 9.2 mg/dL (ref 8.6–10.2)
Chloride: 103 mmol/L (ref 96–106)
Creatinine, Ser: 1.02 mg/dL (ref 0.76–1.27)
Glucose: 134 mg/dL — ABNORMAL HIGH (ref 70–99)
Potassium: 5.1 mmol/L (ref 3.5–5.2)
Sodium: 142 mmol/L (ref 134–144)
eGFR: 76 mL/min/{1.73_m2} (ref >59)

## 2022-06-18 LAB — CBC
Hematocrit: 47.6 % (ref 37.5–51.0)
Hemoglobin: 16 g/dL (ref 13.0–17.7)
MCH: 30.8 pg (ref 26.6–33.0)
MCHC: 33.6 g/dL (ref 31.5–35.7)
MCV: 92 fL (ref 79–97)
Platelets: 167 10*3/uL (ref 150–450)
RBC: 5.19 x10E6/uL (ref 4.14–5.80)
RDW: 13.1 % (ref 11.6–15.4)
WBC: 6.9 10*3/uL (ref 3.4–10.8)

## 2022-06-25 ENCOUNTER — Encounter: Payer: Self-pay | Admitting: Pulmonary Disease

## 2022-06-25 ENCOUNTER — Ambulatory Visit: Payer: Medicare Other | Admitting: Pulmonary Disease

## 2022-06-25 VITALS — BP 128/80 | HR 83 | Temp 98.7°F | Ht 69.0 in | Wt 233.6 lb

## 2022-06-25 DIAGNOSIS — I4819 Other persistent atrial fibrillation: Secondary | ICD-10-CM

## 2022-06-25 DIAGNOSIS — J449 Chronic obstructive pulmonary disease, unspecified: Secondary | ICD-10-CM

## 2022-06-25 DIAGNOSIS — G4733 Obstructive sleep apnea (adult) (pediatric): Secondary | ICD-10-CM

## 2022-06-25 DIAGNOSIS — J4489 Other specified chronic obstructive pulmonary disease: Secondary | ICD-10-CM

## 2022-06-25 DIAGNOSIS — J9611 Chronic respiratory failure with hypoxia: Secondary | ICD-10-CM

## 2022-06-25 DIAGNOSIS — J479 Bronchiectasis, uncomplicated: Secondary | ICD-10-CM

## 2022-06-25 NOTE — Progress Notes (Signed)
Subjective:    Patient ID: Clarence Dawson, male    DOB: December 14, 1946, 76 y.o.   MRN: [id number] Patient Care Team: Carylon Perches, MD as PCP - General (Internal Medicine) Marinus Maw, MD as PCP - Cardiology (Cardiology) Marinus Maw, MD as PCP - Electrophysiology (Cardiology) Chief Complaint  Patient presents with   Follow-up    Breathing is baseline--SOB with exertion and prod cough with clear sputum in the morning. Dx with afib.    HPI Patient is a 76 year old former smoker (63 PY) who presents for follow-up of asthma with COPD overlap, bronchiectasis and chronic respiratory failure with hypoxia.  He was last seen on 20 March 2022.  At that time he had been diagnosed with A-fib again and was undergoing further workup by cardiology.  This is a scheduled visit.  The patient notes that since being on Breztri he actually has been doing better with regards to his symptoms of shortness of breath and cough during the day.  His only complaint is that of increased congestion in the mornings which usually clears after he drinks a second cup of coffee in the morning.  He notes that he is on CPAP for sleep apnea and sometimes the humidity reservoir is empty in the mornings. This is managed by his primary care physician. He is compliant with vest physiotherapy and supplemental oxygen.  At his prior visit he also noted that he had had issues with atrial fibrillation and had required cardioversion.  He continues to follow-up with cardiology as he has intermittent issues with this.  He follows with electrophysiology and has pacemaker in place as well.  They may need to make a change in his medications given that the cardioversions do not "stick".  As noted, he does not endorse any other respiratory issues or symptoms today.   He is on Xolair for his asthmatic component is that he feels well on the medication for approximately 2 weeks and then the effect wanes.  We discussed that we may need to switch this  medication to Dupixent if his Xolair affect continues to decline.   Review of Systems A 10 point review of systems was performed and it is as noted above otherwise negative.  Patient Active Problem List   Diagnosis Date Noted   Secondary hypercoagulable state (HCC) 01/02/2020   Chronic diastolic heart failure (HCC) 11/22/2019   Paroxysmal atrial fibrillation (HCC) 09/22/2019   Atrial fibrillation with RVR (HCC)    Lobar pneumonia (HCC) 09/21/2019   Acute respiratory failure with hypoxia (HCC) 09/21/2019   Acute on chronic respiratory failure (HCC) 09/18/2019   Severe sepsis (HCC)    Elevated lactic acid level    GERD (gastroesophageal reflux disease)    Chronic bronchitis (HCC) 02/09/2019   CAP (community acquired pneumonia) 03/03/2018   Primary localized osteoarthritis of right knee 08/11/2017   Asthma with acute exacerbation 12/08/2016   Chest pain 10/20/2016   HTN (hypertension) 10/20/2016   Morbid obesity due to excess calories (HCC) 06/24/2016   Severe persistent asthma 02/12/2016   Upper airway cough syndrome 01/24/2016   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   Persistent atrial fibrillation    Syncope    Hyperthyroidism    Hyperlipidemia    RBBB (right bundle branch block)    Tricuspid valve disorder    Aortic valve disorder    Carotid artery stenosis    Type 2 diabetes mellitus (HCC)    Pacemaker    OSA (obstructive sleep apnea)    Arthritis  Sinoatrial node dysfunction (HCC)    PVC's (premature ventricular contractions) 07/17/2011   Essential hypertension, benign 01/24/2011   Premature ventricular contractions 01/24/2011   PPM-[facility] 01/24/2011   Social History   Tobacco Use   Smoking status: Former    Packs/day: 3.00    Years: 25.00    Total pack years: 75.00    Types: Cigarettes    Start date: 02/20/1958    Quit date: 02/12/1984    Years since quitting: 38.3   Smokeless tobacco: Former    Types: Chew   Tobacco comments:    Former smoker 08/30/2021   Substance Use Topics   Alcohol use: Yes    Alcohol/week: 2.0 - 4.0 standard drinks of alcohol    Types: 1 - 2 Cans of beer, 1 - 2 Glasses of wine per week   Allergies  Allergen Reactions   Food Anaphylaxis and Shortness Of Breath    TREE NUTS   Iodinated Contrast Media Hives and Shortness Of Breath    Patient states hives to throat closing. (01/15/17: patient states this reaction was "about 20 years ago" with possibly an IVP.  He now says high doses of prednisone "throw me into AFib."  He has tolerated CT arthrograms with Benadrly 50mg  PO one hour before injection.  Donell Sievert, RN)   Shellfish Allergy Anaphylaxis and Shortness Of Breath    To shellfish, crabs.  Makes him feel like "things are crawling all over" me.  Denies airway issues with these foods.  Donell Sievert, RN 01/15/17)   Goat-Derived Products Hives    GOAT CHEESE    Prednisone Palpitations    PRECIPITATES A-FIB   Metformin And Related Diarrhea    High doses at once   Vancomycin Anxiety   Voltaren [Diclofenac Sodium] Other (See Comments)    Feels like things are crawling on him   Current Meds  Medication Sig   acetaminophen (TYLENOL) 500 MG tablet Take 1,000 mg by mouth 3 (three) times daily.   albuterol (PROVENTIL HFA;VENTOLIN HFA) 108 (90 Base) MCG/ACT inhaler Inhale 2 puffs every 4 (four) hours as needed into the lungs for shortness of breath (only if you can't catch your breath).   atorvastatin (LIPITOR) 20 MG tablet Take 20 mg by mouth at bedtime.   bisoprolol (ZEBETA) 5 MG tablet TAKE 1 AND 1/2 TABLET BY MOUTH TWICE DAILY.   Budeson-Glycopyrrol-Formoterol (BREZTRI AEROSPHERE) 160-9-4.8 MCG/ACT AERO Inhale 2 puffs into the lungs in the morning and at bedtime.   Carboxymethylcellul-Glycerin (LUBRICATING EYE DROPS OP) Place 1 drop into the right eye daily as needed (dry eyes).   CARTIA XT 120 MG 24 hr capsule TAKE (1) CAPSULE BY MOUTH DAILY, MAY TAKE A EXTRA CAPSULE DAILY FOR BREAKTHROUGH AFIB.    diphenhydramine-acetaminophen (TYLENOL PM) 25-500 MG TABS tablet Take 1 tablet by mouth at bedtime as needed (sleep/pain).   EPINEPHrine 0.3 mg/0.3 mL IJ SOAJ injection Inject 0.3 mg into the muscle as needed for anaphylaxis.   flecainide (TAMBOCOR) 150 MG tablet TAKE 1/2 TABLET BY MOUTH TWICE DAILY.   furosemide (LASIX) 40 MG tablet TAKE 1 TABLET BY MOUTH DAILY   glipiZIDE (GLUCOTROL XL) 5 MG 24 hr tablet Take 5 mg by mouth daily before breakfast.   JARDIANCE 25 MG TABS tablet Take 25 mg by mouth daily before breakfast.   levothyroxine (SYNTHROID, LEVOTHROID) 137 MCG tablet Take 137 mcg by mouth daily before breakfast.    losartan (COZAAR) 50 MG tablet Take 50 mg by mouth at bedtime.   metFORMIN (GLUCOPHAGE-XR)  500 MG 24 hr tablet Take 500 mg by mouth in the morning and at bedtime.   Multiple Vitamin (MULTIVITAMIN) tablet Take 1 tablet by mouth daily.   omeprazole (PRILOSEC OTC) 20 MG tablet Take 20 mg by mouth daily.    Respiratory Therapy Supplies (FLUTTER) DEVI Use as directed   rivaroxaban (XARELTO) 20 MG TABS tablet Take 20 mg daily with supper by mouth.   XOLAIR 150 MG/ML prefilled syringe INJECT 300MG  SUBCUTANEOUSLY EVERY 4 WEEKS   Immunization History  Administered Date(s) Administered   Fluad Quad(high Dose 65+) 09/12/2019   Influenza Split 11/14/2015, 10/11/2018   Influenza, High Dose Seasonal PF 09/28/2017   Influenza-Unspecified 11/02/2016, 09/03/2020, 10/03/2021   Moderna SARS-COV2 Booster Vaccination 08/20/2020, 03/04/2021   Moderna Sars-Covid-2 Vaccination 02/15/2020, 03/14/2020   Pneumococcal Conjugate-13 09/28/2015   Tdap 11/28/2019       Objective:   Physical Exam BP 128/80 (BP Location: Left Arm, Cuff Size: Large)   Pulse 83   Temp 98.7 F (37.1 C) (Temporal)   Ht 5\' 9"  (1.753 m)   Wt 233 lb 9.6 oz (106 kg)   SpO2 93%   BMI 34.50 kg/m   SpO2: 93 % O2 Device: 3 LPM POC  GENERAL: Well-developed, obese patient in no acute distress.  He is comfortable on  nasal cannula O2.  He is fully ambulatory. HEAD: Normocephalic, atraumatic.  EYES: Pupils equal, round, reactive to light.  No scleral icterus.  MOUTH: Nose/mouth/throat not examined due to masking requirements for COVID 19.   NECK: Supple. No thyromegaly. Trachea midline. No JVD.  No adenopathy. PULMONARY: Good air entry bilaterally.  Symmetrical, nonlabored, no adventitious sounds. CARDIOVASCULAR: S1 and S2.  Irregular rate and rhythm.  Grade 2/6 mitral regurgitation murmur. Pacemaker on left. GASTROINTESTINAL: Obese otherwise benign. MUSCULOSKELETAL: No joint deformity, no clubbing, no edema.  NEUROLOGIC: No focal deficits, speech is fluent.  No gait disturbance noted.  Awake, alert and oriented. SKIN: Intact,warm,dry. PSYCH: Mood and behavior appropriate.      Assessment & Plan:     ICD-10-CM   1. Asthma-COPD overlap syndrome (HCC)  J44.9    Continue Breztri twice a day Continue as needed albuterol Continue Xolair May need to consider switch to Dupixent    2. Bronchiectasis without complication (HCC)  J47.9    Continue vest physiotherapy    3. Chronic respiratory failure with hypoxia (HCC)  J96.11    Compliant with oxygen at 3 L/min Continue same    4. OSA (obstructive sleep apnea)  G47.33    Continue CPAP    5. Persistent atrial fibrillation  I48.19    This issue adds complexity to his management Managed by cardiology May add to his issues with dyspnea     Will see the patient in follow-up in 3 months time he is to contact us prior to that time should any new problems arise.  Gailen Shelter, MD Advanced Bronchoscopy PCCM [facility]    *This note was dictated using voice recognition software/Dragon.  Despite best efforts to proofread, errors can occur which can change the meaning. Any transcriptional errors that result from this process are unintentional and may not be fully corrected at the time of dictation.

## 2022-06-25 NOTE — Patient Instructions (Signed)
Continue using your Breztri 2 puffs twice a day.  Continue the Xolair.  We will see you in follow-up in 3 months time.  If at that time your respiratory issues are not better (after taking care of your atrial fib) then we will consider switching you to a new shot called Dupixent.

## 2022-07-02 ENCOUNTER — Encounter: Payer: Self-pay | Admitting: Internal Medicine

## 2022-07-03 ENCOUNTER — Other Ambulatory Visit: Payer: Medicare Other | Admitting: *Deleted

## 2022-07-03 DIAGNOSIS — I4819 Other persistent atrial fibrillation: Secondary | ICD-10-CM

## 2022-07-03 LAB — BASIC METABOLIC PANEL
BUN/Creatinine Ratio: 18 (ref 10–24)
BUN: 19 mg/dL (ref 8–27)
CO2: 23 mmol/L (ref 20–29)
Calcium: 9.1 mg/dL (ref 8.6–10.2)
Chloride: 103 mmol/L (ref 96–106)
Creatinine, Ser: 1.03 mg/dL (ref 0.76–1.27)
Glucose: 157 mg/dL — ABNORMAL HIGH (ref 70–99)
Potassium: 4.5 mmol/L (ref 3.5–5.2)
Sodium: 142 mmol/L (ref 134–144)
eGFR: 75 mL/min/{1.73_m2} (ref >59)

## 2022-07-04 NOTE — Pre-Procedure Instructions (Signed)
Instructed patient on the following items: Arrival time 0930 Nothing to eat or drink after midnight No meds AM of procedure Responsible person to drive you home and stay with you for 24 hrs Wash with special soap night before and morning of procedure If on anti-coagulant drug instructions Xarelto- last dose 7/7

## 2022-07-07 ENCOUNTER — Ambulatory Visit (HOSPITAL_COMMUNITY)
Admission: RE | Admit: 2022-07-07 | Discharge: 2022-07-07 | Disposition: A | Discharge status: Home / Self Care | Payer: Medicare Other | Attending: Internal Medicine | Admitting: Internal Medicine

## 2022-07-07 ENCOUNTER — Other Ambulatory Visit: Payer: Self-pay

## 2022-07-07 ENCOUNTER — Ambulatory Visit (HOSPITAL_COMMUNITY)
Admission: RE | Disposition: A | Discharge status: Home / Self Care | Payer: Self-pay | Source: Home / Self Care | Attending: Internal Medicine

## 2022-07-07 DIAGNOSIS — E119 Type 2 diabetes mellitus without complications: Secondary | ICD-10-CM | POA: Insufficient documentation

## 2022-07-07 DIAGNOSIS — Z87891 Personal history of nicotine dependence: Secondary | ICD-10-CM | POA: Insufficient documentation

## 2022-07-07 DIAGNOSIS — Z7984 Long term (current) use of oral hypoglycemic drugs: Secondary | ICD-10-CM | POA: Diagnosis not present

## 2022-07-07 DIAGNOSIS — I495 Sick sinus syndrome: Secondary | ICD-10-CM | POA: Insufficient documentation

## 2022-07-07 DIAGNOSIS — Z7901 Long term (current) use of anticoagulants: Secondary | ICD-10-CM | POA: Insufficient documentation

## 2022-07-07 DIAGNOSIS — Z79899 Other long term (current) drug therapy: Secondary | ICD-10-CM | POA: Insufficient documentation

## 2022-07-07 DIAGNOSIS — Z45010 Encounter for checking and testing of cardiac pacemaker pulse generator [battery]: Secondary | ICD-10-CM | POA: Diagnosis not present

## 2022-07-07 DIAGNOSIS — I10 Essential (primary) hypertension: Secondary | ICD-10-CM | POA: Diagnosis not present

## 2022-07-07 DIAGNOSIS — I4819 Other persistent atrial fibrillation: Secondary | ICD-10-CM | POA: Insufficient documentation

## 2022-07-07 HISTORY — PX: PPM GENERATOR CHANGEOUT: EP1233

## 2022-07-07 LAB — GLUCOSE, CAPILLARY: Glucose-Capillary: 145 mg/dL — ABNORMAL HIGH (ref 70–99)

## 2022-07-07 LAB — MRSA NEXT GEN BY PCR, NASAL: MRSA by PCR Next Gen: NOT DETECTED

## 2022-07-07 SURGERY — PPM GENERATOR CHANGEOUT

## 2022-07-07 MED ORDER — CEFAZOLIN SODIUM-DEXTROSE 2-4 GM/100ML-% IV SOLN
2.0000 g | INTRAVENOUS | Status: AC
  Administered 2022-07-07: 2 g via INTRAVENOUS

## 2022-07-07 MED ORDER — FENTANYL CITRATE (PF) 100 MCG/2ML IJ SOLN
INTRAMUSCULAR | Status: DC | PRN
  Administered 2022-07-07: 25 ug via INTRAVENOUS

## 2022-07-07 MED ORDER — CEFAZOLIN SODIUM-DEXTROSE 2-4 GM/100ML-% IV SOLN
INTRAVENOUS | Status: AC
  Filled 2022-07-07: qty 100

## 2022-07-07 MED ORDER — POVIDONE-IODINE 10 % EX SWAB: 2.0000 | Freq: Once | CUTANEOUS | Status: DC

## 2022-07-07 MED ORDER — ONDANSETRON HCL 4 MG/2ML IJ SOLN: 4.0000 mg | Freq: Four times a day (QID) | INTRAMUSCULAR | Status: DC | PRN

## 2022-07-07 MED ORDER — SODIUM CHLORIDE 0.9 % IV SOLN
INTRAVENOUS | Status: AC
  Filled 2022-07-07: qty 2

## 2022-07-07 MED ORDER — CHLORHEXIDINE GLUCONATE CLOTH 2 % EX PADS: 6.0000 | MEDICATED_PAD | Freq: Every day | CUTANEOUS | Status: DC

## 2022-07-07 MED ORDER — MIDAZOLAM HCL 5 MG/5ML IJ SOLN
INTRAMUSCULAR | Status: DC | PRN
  Administered 2022-07-07: 2 mg via INTRAVENOUS

## 2022-07-07 MED ORDER — LIDOCAINE HCL (PF) 1 % IJ SOLN
INTRAMUSCULAR | Status: AC
  Filled 2022-07-07: qty 60

## 2022-07-07 MED ORDER — LIDOCAINE HCL (PF) 1 % IJ SOLN
INTRAMUSCULAR | Status: DC | PRN
  Administered 2022-07-07: 60 mL

## 2022-07-07 MED ORDER — SODIUM CHLORIDE 0.9 % IV SOLN
80.0000 mg | INTRAVENOUS | Status: AC
  Administered 2022-07-07: 80 mg

## 2022-07-07 MED ORDER — CHLORHEXIDINE GLUCONATE 4 % EX LIQD
4.0000 | Freq: Once | CUTANEOUS | Status: DC
  Filled 2022-07-07: qty 60

## 2022-07-07 MED ORDER — MUPIROCIN 2 % EX OINT
TOPICAL_OINTMENT | CUTANEOUS | Status: AC
  Administered 2022-07-07: 1 via NASAL
  Filled 2022-07-07: qty 22

## 2022-07-07 MED ORDER — SODIUM CHLORIDE 0.9 % IV SOLN: INTRAVENOUS | Status: DC

## 2022-07-07 MED ORDER — MIDAZOLAM HCL 5 MG/5ML IJ SOLN
INTRAMUSCULAR | Status: AC
  Filled 2022-07-07: qty 5

## 2022-07-07 MED ORDER — ACETAMINOPHEN 325 MG PO TABS: 325.0000 mg | ORAL_TABLET | ORAL | Status: DC | PRN

## 2022-07-07 MED ORDER — MUPIROCIN 2 % EX OINT: 1.0000 | TOPICAL_OINTMENT | Freq: Two times a day (BID) | CUTANEOUS | Status: DC

## 2022-07-07 MED ORDER — FENTANYL CITRATE (PF) 100 MCG/2ML IJ SOLN
INTRAMUSCULAR | Status: AC
  Filled 2022-07-07: qty 2

## 2022-07-07 SURGICAL SUPPLY — 6 items
CABLE SURGICAL S-101-97-12 (CABLE) ×2 IMPLANT
PACEMAKER ASSURITY DR-RF (Pacemaker) ×1 IMPLANT
PAD DEFIB RADIO PHYSIO CONN (PAD) ×2 IMPLANT
POUCH AIGIS-R ANTIBACT PPM (Mesh General) ×2 IMPLANT
POUCH AIGIS-R ANTIBACT PPM MED (Mesh General) IMPLANT
TRAY PACEMAKER INSERTION (PACKS) ×2 IMPLANT

## 2022-07-07 NOTE — Progress Notes (Signed)
Called pt to inform them to hold their Xarelto until Saturday evening per Dr Lovena Le. Pt repeated back the instructions, will hold Xarelto until Saturday evening.

## 2022-07-07 NOTE — Discharge Instructions (Signed)
PPM Battery Change, Care After  This sheet gives you information about how to care for yourself after your procedure. Your health care provider may also give you more specific instructions. If you have problems or questions, contact your health care provider. What can I expect after the procedure? After your procedure, it is common to have: Pain or soreness at the site where the cardiac device was inserted. Swelling at the site where the cardiac device was inserted. You should received an information card for your new device in 4-8 weeks. Follow these instructions at home: Incision care  Keep the incision clean and dry. Do not take baths, swim, or use a hot tub until after your wound check.  Do not shower for at least 7 days, or as directed by your health care provider. Pat the area dry with a clean towel. Do not rub the area. This may cause bleeding. Follow instructions from your health care provider about how to take care of your incision. Make sure you: Leave stitches (sutures), skin glue, or adhesive strips in place. These skin closures may need to stay in place for 2 weeks or longer. If adhesive strip edges start to loosen and curl up, you may trim the loose edges. Do not remove adhesive strips completely unless your health care provider tells you to do that. Check your incision area every day for signs of infection. Check for: More redness, swelling, or pain. More fluid or blood. Warmth. Pus or a bad smell. Activity Do not lift anything that is heavier than 10 lb (4.5 kg) until your health care provider says it is okay to do so. For the first week, or as long as told by your health care provider: Avoid lifting your affected arm higher than your shoulder. After 1 week, Be gentle when you move your arms over your head. It is okay to raise your arm to comb your hair. Avoid strenuous exercise. Ask your health care provider when it is okay to: Resume your normal activities. Return to  work or school. Resume sexual activity. Eating and drinking Eat a heart-healthy diet. This should include plenty of fresh fruits and vegetables, whole grains, low-fat dairy products, and lean protein like chicken and fish. Limit alcohol intake to no more than 1 drink a day for non-pregnant women and 2 drinks a day for men. One drink equals 12 oz of beer, 5 oz of wine, or 1 oz of hard liquor. Check ingredients and nutrition facts on packaged foods and beverages. Avoid the following types of food: Food that is high in salt (sodium). Food that is high in saturated fat, like full-fat dairy or red meat. Food that is high in trans fat, like fried food. Food and drinks that are high in sugar. Lifestyle Do not use any products that contain nicotine or tobacco, such as cigarettes and e-cigarettes. If you need help quitting, ask your health care provider. Take steps to manage and control your weight. Once cleared, get regular exercise. Aim for 150 minutes of moderate-intensity exercise (such as walking or yoga) or 75 minutes of vigorous exercise (such as running or swimming) each week. Manage other health problems, such as diabetes or high blood pressure. Ask your health care provider how you can manage these conditions. General instructions Do not drive for 24 hours after your procedure if you were given a medicine to help you relax (sedative). Take over-the-counter and prescription medicines only as told by your health care provider. Avoid putting pressure on the  area where the cardiac device was placed. If you need an MRI after your cardiac device has been placed, be sure to tell the health care provider who orders the MRI that you have a cardiac device. Avoid close and prolonged exposure to electrical devices that have strong magnetic fields. These include: Cell phones. Avoid keeping them in a pocket near the cardiac device, and try using the ear opposite the cardiac device. MP3 players. Household  appliances, like microwaves. Metal detectors. Electric generators. High-tension wires. Keep all follow-up visits as directed by your health care provider. This is important. Contact a health care provider if: You have pain at the incision site that is not relieved by over-the-counter or prescription medicines. You have any of these around your incision site or coming from it: More redness, swelling, or pain. Fluid or blood. Warmth to the touch. Pus or a bad smell. You have a fever. You feel brief, occasional palpitations, light-headedness, or any symptoms that you think might be related to your heart. Get help right away if: You experience chest pain that is different from the pain at the cardiac device site. You develop a red streak that extends above or below the incision site. You experience shortness of breath. You have palpitations or an irregular heartbeat. You have light-headedness that does not go away quickly. You faint or have dizzy spells. Your pulse suddenly drops or increases rapidly and does not return to normal. You begin to gain weight and your legs and ankles swell. Summary After your procedure, it is common to have pain, soreness, and some swelling where the cardiac device was inserted. Make sure to keep your incision clean and dry. Follow instructions from your health care provider about how to take care of your incision. Check your incision every day for signs of infection, such as more pain or swelling, pus or a bad smell, warmth, or leaking fluid and blood. Avoid strenuous exercise and lifting your left arm higher than your shoulder for 2 weeks, or as long as told by your health care provider. This information is not intended to replace advice given to you by your health care provider. Make sure you discuss any questions you have with your health care provider.

## 2022-07-07 NOTE — H&P (Addendum)
Electrophysiology Office Note Date: 06/17/2022   ID:  [name], [name] 1946/06/08, MRN [id number]   PCP: [name], MD Primary Cardiologist: Cristopher Peru, MD Electrophysiologist: Cristopher Peru, MD    CC: Pacemaker follow-up   Clarence Dawson is a 76 y.o. male seen today for Cristopher Peru, MD for routine electrophysiology followup.  Since last being seen in our clinic the patient reports doing very well.  he denies chest pain, palpitations, dyspnea, PND, orthopnea, nausea, vomiting, dizziness, syncope, edema, weight gain, or early satiety.   Device History: [employer], minerals PPM implanted 2005, gen change 2014 for SSS/Tachy-brady       Past Medical History:  Diagnosis Date   Allergic rhinitis     Aortic valve disorder     Asthma      since childhood- seasonal allergies induced   Cancer ([place])      Skin cancer- squamous, basal   Carotid artery stenosis     Essential hypertension     Full dentures     GERD (gastroesophageal reflux disease)     H/O hiatal hernia     Hemorrhage of rectum     Hyperlipidemia     Hypothyroidism     Male circumcision     OSA (obstructive sleep apnea)     Osteoarthritis     Pacemaker      Oct 2005 in [place].   PAF (paroxysmal atrial fibrillation) ([place])     Pneumonia      "several Times" 2015 last time   Primary localized osteoarthritis of right knee 08/11/2017   RBBB (right bundle branch block)     Sinoatrial node dysfunction (HCC)     Syncope     Tricuspid valve disorder     Type 2 diabetes mellitus ([place])      Type II         Past Surgical History:  Procedure Laterality Date   A-V CARDIAC PACEMAKER INSERTION        Sick sinus syndrome DDR pacer   Arthropathy Right 2005    Rebuilding of left thumb and joint    CARDIAC CATHETERIZATION       CARDIAC ELECTROPHYSIOLOGY STUDY AND ABLATION   09/2008    for pvcs, Dr. Loralie Champagne   CARDIOVERSION N/A 12/18/2016    Procedure: CARDIOVERSION;  Surgeon: Pixie Casino, MD;  Location: [facility]  ENDOSCOPY;  Service: Cardiovascular;  Laterality: N/A;   CARDIOVERSION N/A 09/05/2021    Procedure: CARDIOVERSION;  Surgeon: Geralynn Rile, MD;  Location: [place];  Service: Cardiovascular;  Laterality: N/A;   [place]    right wrist   CARPAL TUNNEL RELEASE   05/04/2012    Procedure: CARPAL TUNNEL RELEASE;  Surgeon: Wynonia Sours, MD;  Location: [place];  Service: Orthopedics;  Laterality: Left;   CARPOMETACARPEL SUSPENSION PLASTY Right 11/16/2014    Procedure: SUSPENSIONPLASTY RIGHT THUMB TENDON TRANSFER ABDUCTOR POLLICUS LONGUS EXCISION TRAPEZIUM;  Surgeon: Daryll Brod, MD;  Location: [place];  Service: Orthopedics;  Laterality: Right;   CHOLECYSTECTOMY   1994   CIRCUMCISION       COLONOSCOPY N/A 03/14/2013    Procedure: COLONOSCOPY;  Surgeon: Daneil Dolin, MD;  [facility];  Service: Endoscopy;  Laterality: N/A;  8:15 AM   EYE SURGERY        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-[facility]   EYE SURGERY   2012    Left eye Corneal transplant- partial- Cataract   FLEXIBLE  BRONCHOSCOPY Right 06/23/2019    Procedure: FLEXIBLE BRONCHOSCOPY RIGHT;  Surgeon: [name], MD;  Location: [facility];  Service: Pulmonary;  Laterality: Right;   GALLBLADDER SURGERY   12/01/2006   HAND TENDON SURGERY Left late 1990's    thumb   HEMORROIDECTOMY   2003   INJECTION KNEE Left 08/26/2017    Procedure: LEFT KNEE INJECTION;  Surgeon: Elsie Saas, MD;  Location: [place];  Service: Orthopedics;  Laterality: Left;   left Knee Arthroscopy        [facility]   PARTIAL KNEE ARTHROPLASTY   11/22/2012    Procedure: UNICOMPARTMENTAL KNEE;  Surgeon: Lorn Junes, MD;  Location: [place];  Service: Orthopedics;  Laterality: Left;  left unicompartmental knee arthroplasty   PERMANENT PACEMAKER GENERATOR CHANGE N/A 01/12/2013    Procedure: PERMANENT PACEMAKER GENERATOR CHANGE;  Surgeon: Evans Lance, MD;  Location: [facility]  CATH LAB;  Service: Cardiovascular;  Laterality: N/A;   Rotator cuff Surgery   2001    Right shoulder   TONSILLECTOMY       TOTAL KNEE ARTHROPLASTY Right 08/26/2017    Procedure: TOTAL KNEE ARTHROPLASTY;  Surgeon: Elsie Saas, MD;  Location: [place];  Service: Orthopedics;  Laterality: Right;   varicose vein reduction                Current Outpatient Medications  Medication Sig Dispense Refill   acetaminophen (TYLENOL) 500 MG tablet Take 1,000 mg by mouth 3 (three) times daily.       albuterol (PROVENTIL HFA;VENTOLIN HFA) 108 (90 Base) MCG/ACT inhaler Inhale 2 puffs every 4 (four) hours as needed into the lungs for shortness of breath (only if you can't catch your breath).       atorvastatin (LIPITOR) 20 MG tablet Take 20 mg by mouth at bedtime.       bisoprolol (ZEBETA) 5 MG tablet TAKE 1 AND 1/2 TABLET BY MOUTH TWICE DAILY. 90 tablet 3   Budeson-Glycopyrrol-Formoterol (BREZTRI AEROSPHERE) 160-9-4.8 MCG/ACT AERO Inhale 2 puffs into the lungs in the morning and at bedtime. 10.7 g 11   Carboxymethylcellul-Glycerin (LUBRICATING EYE DROPS OP) Place 1 drop into the right eye daily as needed (dry eyes).       CARTIA XT 120 MG 24 hr capsule TAKE (1) CAPSULE BY MOUTH DAILY, MAY TAKE A EXTRA CAPSULE DAILY FOR BREAKTHROUGH AFIB. 180 capsule 2   diphenhydramine-acetaminophen (TYLENOL PM) 25-500 MG TABS tablet Take 1 tablet by mouth at bedtime as needed (sleep/pain).       EPINEPHrine 0.3 mg/0.3 mL IJ SOAJ injection Inject 0.3 mg into the muscle as needed for anaphylaxis. 1 each 5   flecainide (TAMBOCOR) 150 MG tablet TAKE 1/2 TABLET BY MOUTH TWICE DAILY. 90 tablet 2   furosemide (LASIX) 40 MG tablet TAKE 1 TABLET BY MOUTH DAILY 90 tablet 3   glipiZIDE (GLUCOTROL XL) 5 MG 24 hr tablet Take 5 mg by mouth daily before breakfast.       JARDIANCE 25 MG TABS tablet Take 25 mg by mouth daily before breakfast.       levothyroxine (SYNTHROID, LEVOTHROID) 137 MCG tablet Take 137 mcg by mouth daily before  breakfast.        losartan (COZAAR) 50 MG tablet Take 50 mg by mouth at bedtime.       metFORMIN (GLUCOPHAGE-XR) 500 MG 24 hr tablet Take 500 mg by mouth in the morning and at bedtime.       Multiple Vitamin (MULTIVITAMIN) tablet Take  1 tablet by mouth daily.       omeprazole (PRILOSEC OTC) 20 MG tablet Take 20 mg by mouth daily.        Respiratory Therapy Supplies (FLUTTER) DEVI Use as directed 1 each 0   rivaroxaban (XARELTO) 20 MG TABS tablet Take 20 mg daily with supper by mouth.       XOLAIR 150 MG/ML prefilled syringe INJECT 300MG SUBCUTANEOUSLY EVERY 4 WEEKS 2 mL 11    No current facility-administered medications for this visit.      Allergies:   Food, Iodinated contrast media, Shellfish allergy, Goat-derived products, Prednisone, Metformin and related, Vancomycin, and Voltaren [diclofenac sodium]    Social History: Social History         Socioeconomic History   Marital status: Widowed      Spouse name: Not on file   Number of children: Not on file   Years of education: Not on file   Highest education level: Not on file  Occupational History   Not on file  Tobacco Use   Smoking status: Former      Packs/day: 3.00      Years: 25.00      Total pack years: 75.00      Types: Cigarettes      Start date: 02/20/1958      Quit date: 02/12/1984      Years since quitting: 38.3   Smokeless tobacco: Former      Types: Chew   Tobacco comments:      Former smoker 08/30/2021  Vaping Use   Vaping Use: Never used  Substance and Sexual Activity   Alcohol use: Yes      Alcohol/week: 2.0 - 4.0 standard drinks of alcohol      Types: 1 - 2 Cans of beer, 1 - 2 Glasses of wine per week   Drug use: No   Sexual activity: Not on file  Other Topics Concern   Not on file  Social History Narrative    Regular exercise: No    Social Determinants of Health    Financial Resource Strain: Not on file  Food Insecurity: Not on file  Transportation Needs: Not on file  Physical Activity: Not on  file  Stress: Not on file  Social Connections: Not on file  Intimate Partner Violence: Not on file      Family History:      Family History  Problem Relation Age of Onset   Other Father 47        Sudden Cardiac death   Pancreatic cancer Mother     Colon cancer Mother     Colon cancer Maternal Aunt     Colon polyps Neg Hx          Review of Systems: All other systems reviewed and are otherwise negative except as noted above.   Physical Exam:    Vitals:    06/17/22 1036  BP: 130/78  Pulse: 92  SpO2: 93%  Weight: 238 lb 12.8 oz (108.3 kg)  Height: _0  (1.753 m)      GEN- The patient is well appearing, alert and oriented x 3 today.   HEENT: normocephalic, atraumatic; sclera clear, conjunctiva pink; hearing intact; oropharynx clear; neck supple  Lungs- Clear to ausculation bilaterally, normal work of breathing.  No wheezes, rales, rhonchi Heart- Regular rate and rhythm, no murmurs, rubs or gallops  GI- soft, non-tender, non-distended, bowel sounds present  Extremities- no clubbing or cyanosis. No edema MS- no significant deformity or  atrophy Skin- warm and dry, no rash or lesion; PPM pocket well healed Psych- euthymic mood, full affect Neuro- strength and sensation are intact   PPM Interrogation- reviewed in detail today,  See PACEART report   EKG:  EKG is ordered today. Personal review of ekg ordered today  shows AF in 90s with occasional VPacing    Recent Labs: 08/30/2021: BUN 19; Creatinine, Ser 1.01; Hemoglobin 16.5; Platelets 205; Potassium 4.3; Sodium 139       Wt Readings from Last 3 Encounters:  06/17/22 238 lb 12.8 oz (108.3 kg)  03/25/22 230 lb (104.3 kg)  03/20/22 228 lb 3.2 oz (103.5 kg)      Other studies Reviewed: Additional studies/ records that were reviewed today include: Previous EP office notes, Previous remote checks, Most recent labwork.    Assessment and Plan:   1. Tachy-Brady syndrome s/p St. Jude PPM  Normal PPM function for device  at [facility] 06/10/2022 See Pace Art report No changes today   2. Persistent atrial fibrillation On Flecainide. Persistent since Feb/March. Consider [place] post gen change vs rate control alone, then could potentially stop flecainide.  Hold Xarelto 2 doses prior to gen change.  Otherwise he is tolerating well.      Current medicines are reviewed at length with the patient today.     Labs/ tests ordered today include:     Orders Placed This Encounter  Procedures   Basic metabolic panel   CBC   CUP PACEART INCLINIC DEVICE CHECK   EKG 12-Lead     Disposition:   Follow up with Dr. Lovena Le in as usual post gen change      Signed, Shirley Friar, PA-C  06/17/2022 11:06 AM  EP Attending  Patient seen and examined. Agree with the findings as above. The patient is well known to me s/p PPM insertion. He has reached ERI. He will undergo PPM gen change out. I have reviewed the indications/risks/benefits/goals/expectations and he wishes to proceed.   Carleene Overlie Amitai Delaughter,MD

## 2022-07-08 ENCOUNTER — Encounter (HOSPITAL_COMMUNITY): Payer: Self-pay | Admitting: Internal Medicine

## 2022-07-08 MED FILL — Lidocaine HCl Local Preservative Free (PF) Inj 1%: INTRAMUSCULAR | Qty: 60 | Status: AC

## 2022-07-09 ENCOUNTER — Other Ambulatory Visit: Payer: Self-pay | Admitting: Internal Medicine

## 2022-07-09 ENCOUNTER — Encounter: Payer: Self-pay | Admitting: Internal Medicine

## 2022-07-14 ENCOUNTER — Ambulatory Visit (INDEPENDENT_AMBULATORY_CARE_PROVIDER_SITE_OTHER): Payer: Medicare Other

## 2022-07-14 DIAGNOSIS — I495 Sick sinus syndrome: Secondary | ICD-10-CM

## 2022-07-14 LAB — CUP PACEART REMOTE DEVICE CHECK
Battery Remaining Longevity: 115 mo
Battery Remaining Percentage: 95.5 %
Battery Voltage: 3.14 V
Brady Statistic AP VP Percent: 0 %
Brady Statistic AP VS Percent: 0 %
Brady Statistic AS VP Percent: 0 %
Brady Statistic AS VS Percent: 66 %
Brady Statistic RA Percent Paced: 1 %
Brady Statistic RV Percent Paced: 53 %
Date Time Interrogation Session: 20230717081428
Implantable Lead Implant Date: 20051106
Implantable Lead Implant Date: 20051106
Implantable Lead Location: 753859
Implantable Lead Location: 753860
Implantable Pulse Generator Implant Date: 20140115
Lead Channel Impedance Value: 350 Ohm
Lead Channel Impedance Value: 690 Ohm
Lead Channel Pacing Threshold Amplitude: 0.5 V
Lead Channel Pacing Threshold Pulse Width: 0.5 ms
Lead Channel Sensing Intrinsic Amplitude: 0.5 mV
Lead Channel Sensing Intrinsic Amplitude: 12 mV
Lead Channel Setting Pacing Amplitude: 2.5 V
Lead Channel Setting Pacing Amplitude: 2.5 V
Lead Channel Setting Pacing Pulse Width: 0.5 ms
Lead Channel Setting Sensing Sensitivity: 2 mV
Pulse Gen Model: 2210
Pulse Gen Serial Number: 7439597

## 2022-07-23 ENCOUNTER — Ambulatory Visit (INDEPENDENT_AMBULATORY_CARE_PROVIDER_SITE_OTHER): Payer: Medicare Other

## 2022-07-23 DIAGNOSIS — Z950 Presence of cardiac pacemaker: Secondary | ICD-10-CM

## 2022-07-23 LAB — CUP PACEART INCLINIC DEVICE CHECK
Date Time Interrogation Session: 20230726171210
Implantable Lead Implant Date: 20051106
Implantable Lead Implant Date: 20051106
Implantable Lead Location: 753859
Implantable Lead Location: 753860
Implantable Pulse Generator Implant Date: 20140115
Pulse Gen Model: 2210
Pulse Gen Serial Number: 7439597

## 2022-07-23 NOTE — Patient Instructions (Signed)
   After Your Pacemaker   Monitor your pacemaker site for redness, swelling, and drainage. Call the device clinic at [phone] if you experience these symptoms or fever/chills.  Your incision was closed with Steri-strips or staples:  You may shower 7 days after your procedure and wash your incision with soap and water. Avoid lotions, ointments, or perfumes over your incision until it is well-healed.  You may use a hot tub or a pool after your wound check appointment if the incision is completely closed.   You may drive, unless driving has been restricted by your healthcare providers.   Remote monitoring is used to monitor your pacemaker from home. This monitoring is scheduled every 91 days by our office. It allows [place] to keep an eye on the functioning of your device to ensure it is working properly. You will routinely see your Electrophysiologist annually (more often if necessary).

## 2022-07-23 NOTE — Progress Notes (Signed)
Wound check appointment. Steri-strips removed. Wound without redness or edema. Incision edges approximated, wound well healed. Normal device function. Thresholds, sensing, and impedances consistent with implant measurements. Device programmed at 3.5V/auto capture programmed on for extra safety margin until 3 month visit. Histogram distribution appropriate for patient and level of activity. AT/AF burden > 99%, + Xarelto, No high ventricular rates noted. Patient educated about wound care, arm mobility, lifting restrictions. ROV in 3 months with implanting physician

## 2022-07-25 ENCOUNTER — Telehealth: Payer: Self-pay

## 2022-07-25 NOTE — Telephone Encounter (Signed)
Device alert for ongoing AF, controlled rates Generator change 7/10," DCCV vs rate control strategy post gen change" Burden 100%, Xarelto 20mg , Flecainide Route to triage for plan, will discontinue alert if rate control strategy is the plan.

## 2022-07-28 NOTE — H&P (View-Only) (Signed)
Electrophysiology Office Note Date: 07/29/2022  ID:  Clarence Dawson, DOB Nov 13, 1946, MRN [id number]  PCP: [name], MD Primary Cardiologist: Cristopher Peru, MD Electrophysiologist: Cristopher Peru, MD   CC: Pacemaker follow-up  Clarence Dawson is a 76 y.o. male seen today for Cristopher Peru, MD for routine electrophysiology followup.  Since last being seen in our clinic the patient reports doing about the same. He has a "pretty big house" and is tired walking from one end to the other. He would prefer to try and get back into normal rhythm. No chest pain. + Dyspnea with more than mild exertion. No syncope.   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   PAF (paroxysmal atrial fibrillation) ([place])    Pneumonia    "several Times" 2015 last time   Primary localized osteoarthritis of right knee 08/11/2017   RBBB (right bundle branch block)    Sinoatrial node dysfunction (HCC)    Syncope    Tricuspid valve disorder    Type 2 diabetes mellitus ([place])    Type II   Past Surgical History:  Procedure Laterality Date   A-V CARDIAC PACEMAKER INSERTION     Sick sinus syndrome DDR pacer   Arthropathy Right 2005   Rebuilding of left thumb and joint    CARDIAC CATHETERIZATION     CARDIAC ELECTROPHYSIOLOGY STUDY AND ABLATION  09/2008   for pvcs, Dr. Loralie Champagne   CARDIOVERSION N/A 12/18/2016   Procedure: CARDIOVERSION;  Surgeon: Pixie Casino, MD;   Location: [facility] ENDOSCOPY;  Service: Cardiovascular;  Laterality: N/A;   CARDIOVERSION N/A 09/05/2021   Procedure: CARDIOVERSION;  Surgeon: Geralynn Rile, MD;  Location: [place];  Service: Cardiovascular;  Laterality: N/A;   [place]   right wrist   CARPAL TUNNEL RELEASE  05/04/2012   Procedure: CARPAL TUNNEL RELEASE;  Surgeon: Wynonia Sours, MD;  Location: [place];  Service: Orthopedics;  Laterality: Left;   CARPOMETACARPEL SUSPENSION PLASTY Right 11/16/2014   Procedure: SUSPENSIONPLASTY RIGHT THUMB TENDON TRANSFER ABDUCTOR POLLICUS LONGUS EXCISION TRAPEZIUM;  Surgeon: Daryll Brod, MD;  Location: [place];  Service: Orthopedics;  Laterality: Right;   CHOLECYSTECTOMY  1994   CIRCUMCISION     COLONOSCOPY N/A 03/14/2013   Procedure: COLONOSCOPY;  Surgeon: Daneil Dolin, MD;  [facility];  Service: Endoscopy;  Laterality: N/A;  8:15 AM   EYE SURGERY     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-[facility]   EYE SURGERY  2012   Left eye Corneal transplant- partial- Cataract   FLEXIBLE BRONCHOSCOPY Right 06/23/2019   Procedure: FLEXIBLE BRONCHOSCOPY RIGHT;  Surgeon: [name], MD;  Location: [facility];  Service: Pulmonary;  Laterality: Right;   GALLBLADDER SURGERY  12/01/2006   HAND TENDON SURGERY Left late 1990's   thumb   HEMORROIDECTOMY  2003   INJECTION KNEE Left 08/26/2017   Procedure: LEFT KNEE INJECTION;  Surgeon: Elsie Saas, MD;  Location: [place];  Service: Orthopedics;  Laterality: Left;   left Knee Arthroscopy     April 21 2011- Day Surgery center   PARTIAL KNEE ARTHROPLASTY  11/22/2012   Procedure: UNICOMPARTMENTAL KNEE;  Surgeon: Lorn Junes, MD;  Location: [place];  Service: Orthopedics;  Laterality: Left;  left unicompartmental knee arthroplasty   PERMANENT PACEMAKER GENERATOR CHANGE N/A 01/12/2013   Procedure: PERMANENT PACEMAKER GENERATOR CHANGE;  Surgeon: Evans Lance, MD;  [facility] CATH LAB;   Service: Cardiovascular;  Laterality: N/A;   PPM GENERATOR CHANGEOUT N/A 07/07/2022   Procedure: PPM GENERATOR CHANGEOUT;  Surgeon: Evans Lance, MD;  Location: [place] CV LAB;  Service: Cardiovascular;  Laterality: N/A;   Rotator cuff Surgery  2001   Right shoulder   TONSILLECTOMY     TOTAL KNEE ARTHROPLASTY Right 08/26/2017   Procedure: TOTAL KNEE ARTHROPLASTY;  Surgeon: Elsie Saas, MD;  Location: [place];  Service: Orthopedics;  Laterality: Right;   varicose vein reduction      Current Outpatient Medications  Medication Sig Dispense Refill   acetaminophen (TYLENOL) 500 MG tablet Take 1,000 mg by mouth 3 (three) times daily.     albuterol (PROVENTIL HFA;VENTOLIN HFA) 108 (90 Base) MCG/ACT inhaler Inhale 2 puffs into the lungs every 4 (four) hours as needed for shortness of breath (only if you can't catch your breath/ asthma).     atorvastatin (LIPITOR) 20 MG tablet Take 20 mg by mouth at bedtime.     bisoprolol (ZEBETA) 5 MG tablet TAKE 1 AND 1/2 TABLET BY MOUTH TWICE DAILY. 90 tablet 3   Budeson-Glycopyrrol-Formoterol (BREZTRI AEROSPHERE) 160-9-4.8 MCG/ACT AERO Inhale 2 puffs into the lungs in the morning and at bedtime. 10.7 g 11   Carboxymethylcellul-Glycerin (LUBRICATING EYE DROPS OP) Place 1 drop into the right eye daily as needed (dry eyes).     CARTIA XT 120 MG 24 hr capsule TAKE (1) CAPSULE BY MOUTH DAILY, MAY TAKE A EXTRA CAPSULE DAILY FOR BREAKTHROUGH AFIB. (Patient taking differently: Take 120 mg by mouth 2 (two) times daily.) 180 capsule 2   diphenhydramine-acetaminophen (TYLENOL PM) 25-500 MG TABS tablet Take 1 tablet by mouth at bedtime as needed (sleep/pain).     EPINEPHrine 0.3 mg/0.3 mL IJ SOAJ injection Inject 0.3 mg into the muscle as needed for anaphylaxis. 1 each 5   flecainide (TAMBOCOR) 150 MG tablet TAKE 1/2 TABLET BY MOUTH TWICE DAILY. 90 tablet 2   furosemide (LASIX) 40 MG tablet TAKE 1 TABLET BY MOUTH DAILY 90 tablet 3   glipiZIDE (GLUCOTROL XL) 5 MG 24 hr  tablet Take 5 mg by mouth daily before breakfast.     JARDIANCE 25 MG TABS tablet Take 25 mg by mouth daily before breakfast.     levothyroxine (SYNTHROID) 150 MCG tablet Take 150 mcg by mouth daily.     losartan (COZAAR) 50 MG tablet Take 50 mg by mouth at bedtime.     metFORMIN (GLUCOPHAGE-XR) 500 MG 24 hr tablet Take 500 mg by mouth in the morning and at bedtime.     Multiple Vitamin (MULTIVITAMIN) tablet Take 1 tablet by mouth daily.     omeprazole (PRILOSEC OTC) 20 MG tablet Take 20 mg by mouth every morning.     Respiratory Therapy Supplies (FLUTTER) DEVI Use as directed 1 each 0   rivaroxaban (XARELTO) 20 MG TABS tablet Take 20 mg by mouth at  bedtime.     XOLAIR 150 MG/ML prefilled syringe INJECT 300MG SUBCUTANEOUSLY EVERY 4 WEEKS 2 mL 11   No current facility-administered medications for this visit.    Allergies:   Food, Iodinated contrast media, Shellfish allergy, Goat-derived products, Prednisone, Metformin and related, Vancomycin, and Voltaren [diclofenac sodium]   Social History: Social History   Socioeconomic History   Marital status: Widowed    Spouse name: Not on file   Number of children: Not on file   Years of education: Not on file   Highest education level: Not on file  Occupational History   Not on file  Tobacco Use   Smoking status: Former    Packs/day: 3.00    Years: 25.00    Total pack years: 75.00    Types: Cigarettes    Start date: 02/20/1958    Quit date: 02/12/1984    Years since quitting: 38.4   Smokeless tobacco: Former    Types: Chew   Tobacco comments:    Former smoker 08/30/2021  Vaping Use   Vaping Use: Never used  Substance and Sexual Activity   Alcohol use: Yes    Alcohol/week: 2.0 - 4.0 standard drinks of alcohol    Types: 1 - 2 Cans of beer, 1 - 2 Glasses of wine per week   Drug use: No   Sexual activity: Not on file  Other Topics Concern   Not on file  Social History Narrative   Regular exercise: No   Social Determinants of  Health   Financial Resource Strain: Not on file  Food Insecurity: Not on file  Transportation Needs: Not on file  Physical Activity: Not on file  Stress: Not on file  Social Connections: Not on file  Intimate Partner Violence: Not on file    Family History: Family History  Problem Relation Age of Onset   Other Father 23       Sudden Cardiac death   Pancreatic cancer Mother    Colon cancer Mother    Colon cancer Maternal Aunt    Colon polyps Neg Hx      Review of Systems: All other systems reviewed and are otherwise negative except as noted above.  Physical Exam: Vitals:   07/29/22 0759  BP: 134/82  Pulse: 81  SpO2: 90%  Weight: 234 lb (106.1 kg)  Height: _0  (1.753 m)     GEN- The patient is well appearing, alert and oriented x 3 today.   HEENT: normocephalic, atraumatic; sclera clear, conjunctiva pink; hearing intact; oropharynx clear; neck supple  Lungs- Clear to ausculation bilaterally, normal work of breathing.  No wheezes, rales, rhonchi Heart- Regular rate and rhythm, no murmurs, rubs or gallops  GI- soft, non-tender, non-distended, bowel sounds present  Extremities- no clubbing or cyanosis. No edema MS- no significant deformity or atrophy Skin- warm and dry, no rash or lesion; PPM pocket well healed Psych- euthymic mood, full affect Neuro- strength and sensation are intact  PPM Interrogation- reviewed in detail today,  See PACEART report  EKG:  EKG is not ordered today.  Recent Labs: 06/17/2022: Hemoglobin 16.0; Platelets 167 07/03/2022: BUN 19; Creatinine, Ser 1.03; Potassium 4.5; Sodium 142   Wt Readings from Last 3 Encounters:  07/29/22 234 lb (106.1 kg)  07/07/22 223 lb (101.2 kg)  06/25/22 233 lb 9.6 oz (106 kg)     Other studies Reviewed: Additional studies/ records that were reviewed today include: Previous EP office notes, Previous remote checks, Most recent labwork.   Assessment and  Plan:  1. 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 Persistent atrial fibrillation On Flecainide. Persistent since Feb/March.  Plan for [facility]  If fails [facility] will need to consider alternative agent Has been back on Xarelto uninterrupted since 07/12/2022 after his gen change . Otherwise he is tolerating well.    Current medicines are reviewed at length with the patient today.    Labs/ tests ordered today include:  Orders Placed This Encounter  Procedures   Basic metabolic panel   CBC   EKG 12-Lead     Disposition:   Follow up with EP APP in 4 weeks    Signed, Shirley Friar, PA-C  07/29/2022 8:28 AM  [facility] [address] [place] [address] [phone] (office) [phone] (fax)

## 2022-07-28 NOTE — Progress Notes (Unsigned)
Electrophysiology Office Note Date: 07/29/2022  ID:  Clarence Dawson, DOB Oct 13, 1946, MRN [id number]  PCP: [name], MD Primary Cardiologist: Cristopher Peru, MD Electrophysiologist: Cristopher Peru, MD   CC: Pacemaker follow-up  Clarence Dawson is a 76 y.o. male seen today for Cristopher Peru, MD for routine electrophysiology followup.  Since last being seen in our clinic the patient reports doing about the same. He has a "pretty big house" and is tired walking from one end to the other. He would prefer to try and get back into normal rhythm. No chest pain. + Dyspnea with more than mild exertion. No syncope.   Device History: [employer], minerals PPM implanted 2005, gen change 2014 for SSS/Tachy-brady  Past Medical History:  Diagnosis Date   Allergic rhinitis    Aortic valve disorder    Asthma    since childhood- seasonal allergies induced   Cancer ([place])    Skin cancer- squamous, basal   Carotid artery stenosis    Essential hypertension    Full dentures    GERD (gastroesophageal reflux disease)    H/O hiatal hernia    Hemorrhage of rectum    Hyperlipidemia    Hypothyroidism    Male circumcision    OSA (obstructive sleep apnea)    Osteoarthritis    Pacemaker    Oct 2005 in [place].   PAF (paroxysmal atrial fibrillation) ([place])    Pneumonia    "several Times" 2015 last time   Primary localized osteoarthritis of right knee 08/11/2017   RBBB (right bundle branch block)    Sinoatrial node dysfunction ([facility])    Syncope    Tricuspid valve disorder    Type 2 diabetes mellitus ([place])    Type II   Past Surgical History:  Procedure Laterality Date   A-V CARDIAC PACEMAKER INSERTION     Sick sinus syndrome DDR pacer   Arthropathy Right 2005   Rebuilding of left thumb and joint    CARDIAC CATHETERIZATION     CARDIAC ELECTROPHYSIOLOGY STUDY AND ABLATION  09/2008   for pvcs, Dr. Loralie Champagne   CARDIOVERSION N/A 12/18/2016   Procedure: CARDIOVERSION;  Surgeon: Pixie Casino, MD;   Location: [facility];  Service: Cardiovascular;  Laterality: N/A;   CARDIOVERSION N/A 09/05/2021   Procedure: CARDIOVERSION;  Surgeon: Geralynn Rile, MD;  Location: [place];  Service: Cardiovascular;  Laterality: N/A;   [place]   right wrist   CARPAL TUNNEL RELEASE  05/04/2012   Procedure: CARPAL TUNNEL RELEASE;  Surgeon: Wynonia Sours, MD;  Location: [place];  Service: Orthopedics;  Laterality: Left;   CARPOMETACARPEL SUSPENSION PLASTY Right 11/16/2014   Procedure: SUSPENSIONPLASTY RIGHT THUMB TENDON TRANSFER ABDUCTOR POLLICUS LONGUS EXCISION TRAPEZIUM;  Surgeon: Daryll Brod, MD;  Location: [place];  Service: Orthopedics;  Laterality: Right;   CHOLECYSTECTOMY  1994   CIRCUMCISION     COLONOSCOPY N/A 03/14/2013   Procedure: COLONOSCOPY;  Surgeon: Daneil Dolin, MD;  [facility];  Service: Endoscopy;  Laterality: N/A;  8:15 AM   EYE SURGERY     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-[facility]   EYE SURGERY  2012   Left eye Corneal transplant- partial- Cataract   FLEXIBLE BRONCHOSCOPY Right 06/23/2019   Procedure: FLEXIBLE BRONCHOSCOPY RIGHT;  Surgeon: [name], MD;  Location: [facility] ORS;  Service: Pulmonary;  Laterality: Right;   GALLBLADDER SURGERY  12/01/2006   HAND TENDON SURGERY Left late 1990's   thumb   HEMORROIDECTOMY  2003   INJECTION KNEE Left 08/26/2017   Procedure: LEFT KNEE INJECTION;  Surgeon: Elsie Saas, MD;  Location: [place];  Service: Orthopedics;  Laterality: Left;   left Knee Arthroscopy     [facility] 2011- [facility]   PARTIAL KNEE ARTHROPLASTY  11/22/2012   Procedure: UNICOMPARTMENTAL KNEE;  Surgeon: Lorn Junes, MD;  Location: [place];  Service: Orthopedics;  Laterality: Left;  left unicompartmental knee arthroplasty   PERMANENT PACEMAKER GENERATOR CHANGE N/A 01/12/2013   Procedure: PERMANENT PACEMAKER GENERATOR CHANGE;  Surgeon: Evans Lance, MD;  Location[facility] CATH LAB;   Service: Cardiovascular;  Laterality: N/A;   PPM GENERATOR CHANGEOUT N/A 07/07/2022   Procedure: PPM GENERATOR CHANGEOUT;  Surgeon: Evans Lance, MD;  Location: [place] CV LAB;  Service: Cardiovascular;  Laterality: N/A;   Rotator cuff Surgery  2001   Right shoulder   TONSILLECTOMY     TOTAL KNEE ARTHROPLASTY Right 08/26/2017   Procedure: TOTAL KNEE ARTHROPLASTY;  Surgeon: Elsie Saas, MD;  Location: [place];  Service: Orthopedics;  Laterality: Right;   varicose vein reduction      Current Outpatient Medications  Medication Sig Dispense Refill   acetaminophen (TYLENOL) 500 MG tablet Take 1,000 mg by mouth 3 (three) times daily.     albuterol (PROVENTIL HFA;VENTOLIN HFA) 108 (90 Base) MCG/ACT inhaler Inhale 2 puffs into the lungs every 4 (four) hours as needed for shortness of breath (only if you can't catch your breath/ asthma).     atorvastatin (LIPITOR) 20 MG tablet Take 20 mg by mouth at bedtime.     bisoprolol (ZEBETA) 5 MG tablet TAKE 1 AND 1/2 TABLET BY MOUTH TWICE DAILY. 90 tablet 3   Budeson-Glycopyrrol-Formoterol (BREZTRI AEROSPHERE) 160-9-4.8 MCG/ACT AERO Inhale 2 puffs into the lungs in the morning and at bedtime. 10.7 g 11   Carboxymethylcellul-Glycerin (LUBRICATING EYE DROPS OP) Place 1 drop into the right eye daily as needed (dry eyes).     CARTIA XT 120 MG 24 hr capsule TAKE (1) CAPSULE BY MOUTH DAILY, MAY TAKE A EXTRA CAPSULE DAILY FOR BREAKTHROUGH AFIB. (Patient taking differently: Take 120 mg by mouth 2 (two) times daily.) 180 capsule 2   diphenhydramine-acetaminophen (TYLENOL PM) 25-500 MG TABS tablet Take 1 tablet by mouth at bedtime as needed (sleep/pain).     EPINEPHrine 0.3 mg/0.3 mL IJ SOAJ injection Inject 0.3 mg into the muscle as needed for anaphylaxis. 1 each 5   flecainide (TAMBOCOR) 150 MG tablet TAKE 1/2 TABLET BY MOUTH TWICE DAILY. 90 tablet 2   furosemide (LASIX) 40 MG tablet TAKE 1 TABLET BY MOUTH DAILY 90 tablet 3   glipiZIDE (GLUCOTROL XL) 5 MG 24 hr  tablet Take 5 mg by mouth daily before breakfast.     JARDIANCE 25 MG TABS tablet Take 25 mg by mouth daily before breakfast.     levothyroxine (SYNTHROID) 150 MCG tablet Take 150 mcg by mouth daily.     losartan (COZAAR) 50 MG tablet Take 50 mg by mouth at bedtime.     metFORMIN (GLUCOPHAGE-XR) 500 MG 24 hr tablet Take 500 mg by mouth in the morning and at bedtime.     Multiple Vitamin (MULTIVITAMIN) tablet Take 1 tablet by mouth daily.     omeprazole (PRILOSEC OTC) 20 MG tablet Take 20 mg by mouth every morning.     Respiratory Therapy Supplies (FLUTTER) DEVI Use as directed 1 each 0   rivaroxaban (XARELTO) 20 MG TABS tablet Take 20 mg by mouth at  bedtime.     XOLAIR 150 MG/ML prefilled syringe INJECT 300MG SUBCUTANEOUSLY EVERY 4 WEEKS 2 mL 11   No current facility-administered medications for this visit.    Allergies:   Food, Iodinated contrast media, Shellfish allergy, Goat-derived products, Prednisone, Metformin and related, Vancomycin, and Voltaren [diclofenac sodium]   Social History: Social History   Socioeconomic History   Marital status: Widowed    Spouse name: Not on file   Number of children: Not on file   Years of education: Not on file   Highest education level: Not on file  Occupational History   Not on file  Tobacco Use   Smoking status: Former    Packs/day: 3.00    Years: 25.00    Total pack years: 75.00    Types: Cigarettes    Start date: 02/20/1958    Quit date: 02/12/1984    Years since quitting: 38.4   Smokeless tobacco: Former    Types: Chew   Tobacco comments:    Former smoker 08/30/2021  Vaping Use   Vaping Use: Never used  Substance and Sexual Activity   Alcohol use: Yes    Alcohol/week: 2.0 - 4.0 standard drinks of alcohol    Types: 1 - 2 Cans of beer, 1 - 2 Glasses of wine per week   Drug use: No   Sexual activity: Not on file  Other Topics Concern   Not on file  Social History Narrative   Regular exercise: No   Social Determinants of  Health   Financial Resource Strain: Not on file  Food Insecurity: Not on file  Transportation Needs: Not on file  Physical Activity: Not on file  Stress: Not on file  Social Connections: Not on file  Intimate Partner Violence: Not on file    Family History: Family History  Problem Relation Age of Onset   Other Father 23       Sudden Cardiac death   Pancreatic cancer Mother    Colon cancer Mother    Colon cancer Maternal Aunt    Colon polyps Neg Hx      Review of Systems: All other systems reviewed and are otherwise negative except as noted above.  Physical Exam: Vitals:   07/29/22 0759  BP: 134/82  Pulse: 81  SpO2: 90%  Weight: 234 lb (106.1 kg)  Height: _0  (1.753 m)     GEN- The patient is well appearing, alert and oriented x 3 today.   HEENT: normocephalic, atraumatic; sclera clear, conjunctiva pink; hearing intact; oropharynx clear; neck supple  Lungs- Clear to ausculation bilaterally, normal work of breathing.  No wheezes, rales, rhonchi Heart- Regular rate and rhythm, no murmurs, rubs or gallops  GI- soft, non-tender, non-distended, bowel sounds present  Extremities- no clubbing or cyanosis. No edema MS- no significant deformity or atrophy Skin- warm and dry, no rash or lesion; PPM pocket well healed Psych- euthymic mood, full affect Neuro- strength and sensation are intact  PPM Interrogation- reviewed in detail today,  See PACEART report  EKG:  EKG is not ordered today.  Recent Labs: 06/17/2022: Hemoglobin 16.0; Platelets 167 07/03/2022: BUN 19; Creatinine, Ser 1.03; Potassium 4.5; Sodium 142   Wt Readings from Last 3 Encounters:  07/29/22 234 lb (106.1 kg)  07/07/22 223 lb (101.2 kg)  06/25/22 233 lb 9.6 oz (106 kg)     Other studies Reviewed: Additional studies/ records that were reviewed today include: Previous EP office notes, Previous remote checks, Most recent labwork.   Assessment and  Plan:  1. 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 Persistent atrial fibrillation On Flecainide. Persistent since Feb/March.  Plan for [facility]  If fails [facility] will need to consider alternative agent Has been back on Xarelto uninterrupted since 07/12/2022 after his gen change . Otherwise he is tolerating well.    Current medicines are reviewed at length with the patient today.    Labs/ tests ordered today include:  Orders Placed This Encounter  Procedures   Basic metabolic panel   CBC   EKG 12-Lead     Disposition:   Follow up with EP APP in 4 weeks    Signed, Shirley Friar, PA-C  07/29/2022 8:28 AM  [facility] [address] [place] [address] [phone] (office) [phone] (fax)

## 2022-07-29 ENCOUNTER — Ambulatory Visit (INDEPENDENT_AMBULATORY_CARE_PROVIDER_SITE_OTHER): Payer: Medicare Other | Admitting: Student

## 2022-07-29 ENCOUNTER — Encounter: Payer: Self-pay | Admitting: Student

## 2022-07-29 VITALS — BP 134/82 | HR 81 | Ht 69.0 in | Wt 234.0 lb

## 2022-07-29 DIAGNOSIS — I495 Sick sinus syndrome: Secondary | ICD-10-CM | POA: Diagnosis not present

## 2022-07-29 DIAGNOSIS — I4819 Other persistent atrial fibrillation: Secondary | ICD-10-CM | POA: Diagnosis not present

## 2022-07-29 LAB — BASIC METABOLIC PANEL
BUN/Creatinine Ratio: 23 (ref 10–24)
BUN: 24 mg/dL (ref 8–27)
CO2: 30 mmol/L — ABNORMAL HIGH (ref 20–29)
Calcium: 9.4 mg/dL (ref 8.6–10.2)
Chloride: 106 mmol/L (ref 96–106)
Creatinine, Ser: 1.04 mg/dL (ref 0.76–1.27)
Glucose: 172 mg/dL — ABNORMAL HIGH (ref 70–99)
Potassium: 4.7 mmol/L (ref 3.5–5.2)
Sodium: 142 mmol/L (ref 134–144)
eGFR: 74 mL/min/{1.73_m2} (ref >59)

## 2022-07-29 LAB — CBC
Hematocrit: 49.6 % (ref 37.5–51.0)
Hemoglobin: 16.6 g/dL (ref 13.0–17.7)
MCH: 30.9 pg (ref 26.6–33.0)
MCHC: 33.5 g/dL (ref 31.5–35.7)
MCV: 92 fL (ref 79–97)
Platelets: 170 10*3/uL (ref 150–450)
RBC: 5.38 x10E6/uL (ref 4.14–5.80)
RDW: 14.2 % (ref 11.6–15.4)
WBC: 6.8 10*3/uL (ref 3.4–10.8)

## 2022-07-29 NOTE — Patient Instructions (Signed)
Medication Instructions:  Your physician recommends that you continue on your current medications as directed. Please refer to the Current Medication list given to you today.  *If you need a refill on your cardiac medications before your next appointment, please call your pharmacy*   Lab Work: TODAY: BMET, CBC  If you have labs (blood work) drawn today and your tests are completely normal, you will receive your results only by: [place] (if you have MyChart) OR A paper copy in the mail If you have any lab test that is abnormal or we need to change your treatment, we will call you to review the results.  Follow-Up: At [facility], you and your health needs are our priority.  As part of our continuing mission to provide you with exceptional heart care, we have created designated Provider Care Teams.  These Care Teams include your primary Cardiologist (physician) and Advanced Practice Providers (APPs -  Physician Assistants and Nurse Practitioners) who all work together to provide you with the care you need, when you need it.   Your next appointment:}  08/26/2022  Other Instructions See letter for Cardioversion Instructions

## 2022-07-30 ENCOUNTER — Encounter (HOSPITAL_COMMUNITY): Payer: Self-pay | Admitting: Cardiology

## 2022-07-30 NOTE — Progress Notes (Signed)
Attempted to obtain medical history via telephone, unable to reach at this time. HIPAA compliant voicemail message left requesting return call to pre surgical testing department. 

## 2022-08-04 ENCOUNTER — Encounter: Payer: Self-pay | Admitting: Diagnostic Neuroimaging

## 2022-08-04 ENCOUNTER — Ambulatory Visit: Payer: Medicare Other | Admitting: Diagnostic Neuroimaging

## 2022-08-04 VITALS — BP 133/78 | HR 74 | Ht 69.0 in | Wt 234.0 lb

## 2022-08-04 DIAGNOSIS — M79602 Pain in left arm: Secondary | ICD-10-CM

## 2022-08-04 NOTE — Patient Instructions (Signed)
LEFT ARM PAIN (joints, aching, throbbing) - now improved with reduced activity (previously aggravated with using lawn mower and driving) - not likely related to lipoma

## 2022-08-04 NOTE — Progress Notes (Unsigned)
[facility]  PATIENT: Clarence Dawson DOB: 29-Jul-1946  REFERRING CLINICIAN: [name], MD HISTORY FROM: patient  REASON FOR VISIT: new consult    HISTORICAL  CHIEF COMPLAINT:  Chief Complaint  Patient presents with   Numbness    Rm 6 alone  Pt is well, has been having L arm pain and numbness in the last 6-8 months.     HISTORY OF PRESENT ILLNESS:   76 year old male with left arm pain.  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  This was managed conservatively.  Last 16 months he has been having aching pain in his left arm throughout from his shoulder down to his hand.  Mild numbness and tingling.  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  Now that he is adjusted his activity level his symptoms are improving.   REVIEW OF SYSTEMS: Full 14 system review of systems performed and negative with exception of: as per HPI.  ALLERGIES: Allergies  Allergen Reactions   Food Anaphylaxis and Shortness Of Breath    TREE NUTS   Iodinated Contrast Media Hives and Shortness Of Breath    Patient states hives to throat closing. (01/15/17: patient states this reaction was "about 20 years ago" with possibly an IVP.  He now says high doses of prednisone "throw me into AFib."  He has tolerated CT arthrograms with Benadrly 65m PO one hour before injection.  JBrita Romp RN)   Shellfish Allergy Anaphylaxis and Shortness Of Breath    To shellfish, crabs.  Makes him feel like "things are crawling all over" me.  Denies airway issues with these foods.  (Brita Romp RN 01/15/17)   Goat-Derived Products Hives    GOAT CHEESE    Prednisone Palpitations    PRECIPITATES A-FIB   Metformin And Related Diarrhea    High doses at once   Vancomycin Anxiety    Red man syndrome   Voltaren [Diclofenac Sodium] Other (See Comments)    Feels like things  are crawling on him    HOME MEDICATIONS: Outpatient Medications Prior to Visit  Medication Sig Dispense Refill   acetaminophen (TYLENOL) 500 MG tablet Take 1,000 mg by mouth 2 (two) times daily.     albuterol (PROVENTIL HFA;VENTOLIN HFA) 108 (90 Base) MCG/ACT inhaler Inhale 2 puffs into the lungs every 4 (four) hours as needed for shortness of breath (only if you can't catch your breath/ asthma).     atorvastatin (LIPITOR) 20 MG tablet Take 20 mg by mouth at bedtime.     bisoprolol (ZEBETA) 5 MG tablet TAKE 1 AND 1/2 TABLET BY MOUTH TWICE DAILY. 90 tablet 3   Budeson-Glycopyrrol-Formoterol (BREZTRI AEROSPHERE) 160-9-4.8 MCG/ACT AERO Inhale 2 puffs into the lungs in the morning and at bedtime. 10.7 g 11   Carboxymethylcellul-Glycerin (LUBRICATING EYE DROPS OP) Place 1 drop into the right eye daily as needed (dry eyes).     CARTIA XT 120 MG 24 hr capsule TAKE (1) CAPSULE BY MOUTH DAILY, MAY TAKE A EXTRA CAPSULE DAILY FOR BREAKTHROUGH AFIB. (Patient taking differently: Take 120 mg by mouth daily.) 180 capsule 2   diphenhydramine-acetaminophen (TYLENOL PM) 25-500 MG TABS tablet Take 1 tablet by mouth at bedtime as needed (sleep/pain).     EPINEPHrine 0.3 mg/0.3 mL IJ SOAJ injection Inject 0.3 mg into the muscle as needed for anaphylaxis. 1 each 5  flecainide (TAMBOCOR) 150 MG tablet TAKE 1/2 TABLET BY MOUTH TWICE DAILY. 90 tablet 2   furosemide (LASIX) 40 MG tablet TAKE 1 TABLET BY MOUTH DAILY 90 tablet 3   glipiZIDE (GLUCOTROL XL) 5 MG 24 hr tablet Take 5 mg by mouth daily before breakfast.     hydrocortisone cream 0.5 % Apply 1 Application topically daily as needed for itching.     JARDIANCE 25 MG TABS tablet Take 25 mg by mouth daily before breakfast.     levothyroxine (SYNTHROID) 150 MCG tablet Take 150 mcg by mouth daily before breakfast.     losartan (COZAAR) 50 MG tablet Take 50 mg by mouth at bedtime.     metFORMIN (GLUCOPHAGE-XR) 500 MG 24 hr tablet Take 500 mg by mouth in the morning and  at bedtime.     Multiple Vitamin (MULTIVITAMIN) tablet Take 1 tablet by mouth daily.     omeprazole (PRILOSEC OTC) 20 MG tablet Take 20 mg by mouth every morning.     Respiratory Therapy Supplies (FLUTTER) DEVI Use as directed 1 each 0   rivaroxaban (XARELTO) 20 MG TABS tablet Take 20 mg by mouth at bedtime.     XOLAIR 150 MG/ML prefilled syringe INJECT 300MG SUBCUTANEOUSLY EVERY 4 WEEKS 2 mL 11   No facility-administered medications prior to visit.    PAST MEDICAL HISTORY: Past Medical History:  Diagnosis Date   Allergic rhinitis    Aortic valve disorder    Asthma    since childhood- seasonal allergies induced   Cancer ([place])    Skin cancer- squamous, basal   Carotid artery stenosis    Essential hypertension    Full dentures    GERD (gastroesophageal reflux disease)    H/O hiatal hernia    Hemorrhage of rectum    Hyperlipidemia    Hypothyroidism    Male circumcision    OSA (obstructive sleep apnea)    Osteoarthritis    Pacemaker    Oct 2005 in [place].   PAF (paroxysmal atrial fibrillation) ([place])    Pneumonia    "several Times" 2015 last time   Primary localized osteoarthritis of right knee 08/11/2017   RBBB (right bundle branch block)    Sinoatrial node dysfunction ([facility])    Syncope    Tricuspid valve disorder    Type 2 diabetes mellitus ([place])    Type II    PAST SURGICAL HISTORY: Past Surgical History:  Procedure Laterality Date   A-V CARDIAC PACEMAKER INSERTION     Sick sinus syndrome DDR pacer   Arthropathy Right 2005   Rebuilding of left thumb and joint    CARDIAC CATHETERIZATION     CARDIAC ELECTROPHYSIOLOGY STUDY AND ABLATION  09/2008   for pvcs, Dr. Loralie Champagne   CARDIOVERSION N/A 12/18/2016   Procedure: CARDIOVERSION;  Surgeon: Pixie Casino, MD;  Location: [facility] ENDOSCOPY;  Service: Cardiovascular;  Laterality: N/A;   CARDIOVERSION N/A 09/05/2021   Procedure: CARDIOVERSION;  Surgeon: Geralynn Rile, MD;  Location: [place];  Service:  Cardiovascular;  Laterality: N/A;   [place]   right wrist   CARPAL TUNNEL RELEASE  05/04/2012   Procedure: CARPAL TUNNEL RELEASE;  Surgeon: Wynonia Sours, MD;  Location: [place];  Service: Orthopedics;  Laterality: Left;   CARPOMETACARPEL SUSPENSION PLASTY Right 11/16/2014   Procedure: SUSPENSIONPLASTY RIGHT THUMB TENDON TRANSFER ABDUCTOR POLLICUS LONGUS EXCISION TRAPEZIUM;  Surgeon: Daryll Brod, MD;  Location: [place];  Service: Orthopedics;  Laterality: Right;  CHOLECYSTECTOMY  1994   CIRCUMCISION     COLONOSCOPY N/A 03/14/2013   Procedure: COLONOSCOPY;  Surgeon: Daneil Dolin, MD;  [facility];  Service: Endoscopy;  Laterality: N/A;  8:15 AM   EYE SURGERY     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-[facility]   EYE SURGERY  2012   Left eye Corneal transplant- partial- Cataract   FLEXIBLE BRONCHOSCOPY Right 06/23/2019   Procedure: FLEXIBLE BRONCHOSCOPY RIGHT;  Surgeon: [name], MD;  Location: [facility] ORS;  Service: Pulmonary;  Laterality: Right;   GALLBLADDER SURGERY  12/01/2006   HAND TENDON SURGERY Left late 1990's   thumb   HEMORROIDECTOMY  2003   INJECTION KNEE Left 08/26/2017   Procedure: LEFT KNEE INJECTION;  Surgeon: Elsie Saas, MD;  Location: [place];  Service: Orthopedics;  Laterality: Left;   left Knee Arthroscopy     [facility]   PARTIAL KNEE ARTHROPLASTY  11/22/2012   Procedure: UNICOMPARTMENTAL KNEE;  Surgeon: Lorn Junes, MD;  Location: [place];  Service: Orthopedics;  Laterality: Left;  left unicompartmental knee arthroplasty   PERMANENT PACEMAKER GENERATOR CHANGE N/A 01/12/2013   Procedure: PERMANENT PACEMAKER GENERATOR CHANGE;  Surgeon: Evans Lance, MD;  Location: [facility] CATH LAB;  Service: Cardiovascular;  Laterality: N/A;   PPM GENERATOR CHANGEOUT N/A 07/07/2022   Procedure: PPM GENERATOR CHANGEOUT;  Surgeon: Evans Lance, MD;  Location: [place] CV LAB;  Service:  Cardiovascular;  Laterality: N/A;   Rotator cuff Surgery  2001   Right shoulder   TONSILLECTOMY     TOTAL KNEE ARTHROPLASTY Right 08/26/2017   Procedure: TOTAL KNEE ARTHROPLASTY;  Surgeon: Elsie Saas, MD;  Location: [place];  Service: Orthopedics;  Laterality: Right;   varicose vein reduction      FAMILY HISTORY: Family History  Problem Relation Age of Onset   Liver cancer Mother    Pancreatic cancer Mother    Colon cancer Mother    Hypertension Father    Stroke Father    Other Father 86       Sudden Cardiac death   Heart attack Father    Cancer Sister        brain   Diabetes Sister    Colon cancer Maternal Aunt    Colon polyps Neg Hx     SOCIAL HISTORY: Social History   Socioeconomic History   Marital status: Widowed    Spouse name: Not on file   Number of children: Not on file   Years of education: Not on file   Highest education level: Not on file  Occupational History   Not on file  Tobacco Use   Smoking status: Former    Packs/day: 3.00    Years: 25.00    Total pack years: 75.00    Types: Cigarettes    Start date: 02/20/1958    Quit date: 02/12/1984    Years since quitting: 38.5   Smokeless tobacco: Former    Types: Chew   Tobacco comments:    Former smoker 08/30/2021  Vaping Use   Vaping Use: Never used  Substance and Sexual Activity   Alcohol use: Yes    Alcohol/week: 2.0 - 4.0 standard drinks of alcohol    Types: 1 - 2 Cans of beer, 1 - 2 Glasses of wine per week   Drug use: No   Sexual activity: Not on file  Other Topics Concern   Not on file  Social History Narrative   Regular exercise: No   Social  Determinants of Health   Financial Resource Strain: Not on file  Food Insecurity: Not on file  Transportation Needs: Not on file  Physical Activity: Not on file  Stress: Not on file  Social Connections: Not on file  Intimate Partner Violence: Not on file     PHYSICAL EXAM  GENERAL EXAM/CONSTITUTIONAL: Vitals:  Vitals:   08/04/22 0943   BP: 133/78  Pulse: 74  Weight: 234 lb (106.1 kg)  Height: _0  (1.753 m)   Body mass index is 34.56 kg/m. Wt Readings from Last 3 Encounters:  08/04/22 234 lb (106.1 kg)  07/29/22 234 lb (106.1 kg)  07/07/22 223 lb (101.2 kg)   Patient is in no distress; well developed, nourished and groomed; neck is supple  CARDIOVASCULAR: Examination of carotid arteries is normal; no carotid bruits Regular rate and rhythm, no murmurs Examination of peripheral vascular system by observation and palpation is normal  EYES: Ophthalmoscopic exam of optic discs and posterior segments is normal; no papilledema or hemorrhages No results found.  MUSCULOSKELETAL: Gait, strength, tone, movements noted in Neurologic exam below  NEUROLOGIC: MENTAL STATUS:      No data to display         awake, alert, oriented to person, place and time recent and remote memory intact normal attention and concentration language fluent, comprehension intact, naming intact fund of knowledge appropriate  CRANIAL NERVE:  2nd - no papilledema on fundoscopic exam 2nd, 3rd, 4th, 6th - pupils equal and reactive to light, visual fields full to confrontation, extraocular muscles intact, no nystagmus 5th - facial sensation symmetric 7th - facial strength symmetric 8th - hearing intact 9th - palate elevates symmetrically, uvula midline 11th - shoulder shrug symmetric 12th - tongue protrusion midline  MOTOR:  normal bulk and tone, full strength in the BUE, BLE  SENSORY:  normal and symmetric to light touch, pinprick, temperature, vibration  COORDINATION:  finger-nose-finger, fine finger movements normal  REFLEXES:  deep tendon reflexes TRACE and symmetric  GAIT/STATION:  narrow based gait     DIAGNOSTIC DATA (LABS, IMAGING, TESTING) - I reviewed patient records, labs, notes, testing and imaging myself where available.  Lab Results  Component Value Date   WBC 6.8 07/29/2022   HGB 16.6 07/29/2022    HCT 49.6 07/29/2022   MCV 92 07/29/2022   PLT 170 07/29/2022      Component Value Date/Time   NA 142 07/29/2022 0831   K 4.7 07/29/2022 0831   CL 106 07/29/2022 0831   CO2 30 (H) 07/29/2022 0831   GLUCOSE 172 (H) 07/29/2022 0831   GLUCOSE 144 (H) 08/30/2021 1019   BUN 24 07/29/2022 0831   CREATININE 1.04 07/29/2022 0831   CREATININE 1.27 (H) 08/15/2016 0941   CALCIUM 9.4 07/29/2022 0831   PROT 6.6 09/23/2019 0440   ALBUMIN 3.2 (L) 09/23/2019 0440   AST 16 09/23/2019 0440   ALT 24 09/23/2019 0440   ALKPHOS 59 09/23/2019 0440   BILITOT 1.1 09/23/2019 0440   GFRNONAA >60 08/30/2021 1019   GFRAA 99 11/22/2019 1358   No results found for: "CHOL", "HDL", "LDLCALC", "LDLDIRECT", "TRIG", "CHOLHDL" Lab Results  Component Value Date   HGBA1C 7.2 (H) 09/18/2019   No results found for: "VITAMINB12" No results found for: "TSH"     ASSESSMENT AND PLAN  76 y.o. year old male here with:   Dx:  1. Left arm pain      PLAN:  LEFT ARM PAIN (joints, aching, throbbing) - now improved with reduced activity (  previously aggravated with using lawn mower and driving) - continue conservative management and monitor symptoms; may consider sports medicine evaluation in future versus PT/OT evaluation - not likely related to lipoma  Return for pending if symptoms worsen or fail to improve.    Penni Bombard, MD 5/0/0938, 18:29 AM Certified in Neurology, Neurophysiology and Neuroimaging  [facility] [address] [place], [place] [address] [phone]

## 2022-08-06 ENCOUNTER — Ambulatory Visit (HOSPITAL_COMMUNITY)
Admission: RE | Admit: 2022-08-06 | Discharge: 2022-08-06 | Disposition: A | Discharge status: Home / Self Care | Payer: Medicare Other | Attending: Cardiology | Admitting: Cardiology

## 2022-08-06 ENCOUNTER — Other Ambulatory Visit: Payer: Self-pay

## 2022-08-06 ENCOUNTER — Ambulatory Visit (HOSPITAL_COMMUNITY): Payer: Medicare Other | Admitting: Certified Registered Nurse Anesthetist

## 2022-08-06 ENCOUNTER — Ambulatory Visit (HOSPITAL_BASED_OUTPATIENT_CLINIC_OR_DEPARTMENT_OTHER): Payer: Medicare Other | Admitting: Certified Registered Nurse Anesthetist

## 2022-08-06 ENCOUNTER — Encounter (HOSPITAL_COMMUNITY)
Admission: RE | Disposition: A | Discharge status: Home / Self Care | Payer: Self-pay | Source: Home / Self Care | Attending: Cardiology

## 2022-08-06 ENCOUNTER — Encounter (HOSPITAL_COMMUNITY): Payer: Self-pay | Admitting: Cardiology

## 2022-08-06 ENCOUNTER — Ambulatory Visit (INDEPENDENT_AMBULATORY_CARE_PROVIDER_SITE_OTHER): Payer: Medicare Other

## 2022-08-06 DIAGNOSIS — I4891 Unspecified atrial fibrillation: Secondary | ICD-10-CM

## 2022-08-06 DIAGNOSIS — Z79899 Other long term (current) drug therapy: Secondary | ICD-10-CM | POA: Diagnosis not present

## 2022-08-06 DIAGNOSIS — Z87891 Personal history of nicotine dependence: Secondary | ICD-10-CM | POA: Diagnosis not present

## 2022-08-06 DIAGNOSIS — Z950 Presence of cardiac pacemaker: Secondary | ICD-10-CM | POA: Diagnosis not present

## 2022-08-06 DIAGNOSIS — E039 Hypothyroidism, unspecified: Secondary | ICD-10-CM | POA: Diagnosis not present

## 2022-08-06 DIAGNOSIS — I10 Essential (primary) hypertension: Secondary | ICD-10-CM

## 2022-08-06 DIAGNOSIS — Z7901 Long term (current) use of anticoagulants: Secondary | ICD-10-CM | POA: Insufficient documentation

## 2022-08-06 DIAGNOSIS — I495 Sick sinus syndrome: Secondary | ICD-10-CM | POA: Diagnosis not present

## 2022-08-06 DIAGNOSIS — I480 Paroxysmal atrial fibrillation: Secondary | ICD-10-CM

## 2022-08-06 DIAGNOSIS — I4819 Other persistent atrial fibrillation: Secondary | ICD-10-CM | POA: Diagnosis not present

## 2022-08-06 HISTORY — PX: CARDIOVERSION: SHX1299

## 2022-08-06 LAB — GLUCOSE, CAPILLARY: Glucose-Capillary: 147 mg/dL — ABNORMAL HIGH (ref 70–99)

## 2022-08-06 SURGERY — CARDIOVERSION
Anesthesia: Monitor Anesthesia Care

## 2022-08-06 MED ORDER — LIDOCAINE 2% (20 MG/ML) 5 ML SYRINGE
INTRAMUSCULAR | Status: DC | PRN
  Administered 2022-08-06: 40 mg via INTRAVENOUS

## 2022-08-06 MED ORDER — SODIUM CHLORIDE 0.9 % IV SOLN: INTRAVENOUS | Status: DC

## 2022-08-06 MED ORDER — PROPOFOL 10 MG/ML IV BOLUS
INTRAVENOUS | Status: DC | PRN
  Administered 2022-08-06: 10 mg via INTRAVENOUS
  Administered 2022-08-06: 60 mg via INTRAVENOUS

## 2022-08-06 NOTE — CV Procedure (Signed)
Procedure:   DCCV  Indication:  Symptomatic atrial fibrillation  Procedure Note:  The patient signed informed consent.  They have had had therapeutic anticoagulation with Xarelto greater than 3 weeks.  Anesthesia was administered by Dr. Nyoka Cowden and Reather Laurence, CRNA.  Adequate airway was maintained throughout and vital followed per protocol.  They were cardioverted x 1 with 200J of biphasic synchronized energy.  They converted to A-paced rhythm at 60bpm, confirmed with device interrogation.  There were no apparent complications.  The patient had normal neuro status and respiratory status post procedure with vitals stable as recorded elsewhere.    Follow up:  They will continue on current medical therapy and follow up with cardiology as scheduled.  Oswaldo Milian, MD 08/06/2022 10:20 AM

## 2022-08-06 NOTE — Anesthesia Postprocedure Evaluation (Signed)
Anesthesia Post Note  Patient: Clarence Dawson  Procedure(s) Performed: CARDIOVERSION     Patient location during evaluation: Endoscopy Level of consciousness: awake Pain management: pain level controlled Vital Signs Assessment: post-procedure vital signs reviewed and stable Respiratory status: spontaneous breathing Cardiovascular status: stable Postop Assessment: no apparent nausea or vomiting Anesthetic complications: no   No notable events documented.  Last Vitals:  Vitals:   08/06/22 1046 08/06/22 1047  BP:    Pulse: 60 60  Resp: 12 11  Temp:    SpO2: 94% 94%    Last Pain:  Vitals:   08/06/22 1042  TempSrc:   PainSc: 0-No pain                 Ashleyann Shoun

## 2022-08-06 NOTE — Transfer of Care (Signed)
Immediate Anesthesia Transfer of Care Note  Patient: Clarence Dawson  Procedure(s) Performed: CARDIOVERSION  Patient Location: Endoscopy Unit  Anesthesia Type:General  Level of Consciousness: drowsy and patient cooperative  Airway & Oxygen Therapy: Patient Spontanous Breathing and Patient connected to nasal cannula oxygen  Post-op Assessment: Report given to RN and Post -op Vital signs reviewed and stable  Post vital signs: Reviewed and stable  Last Vitals:  Vitals Value Taken Time  BP 137/75   Temp    Pulse 60   Resp 15   SpO2 95     Last Pain:  Vitals:   08/06/22 0910  TempSrc: Temporal  PainSc: 0-No pain         Complications: No notable events documented.

## 2022-08-06 NOTE — Discharge Instructions (Signed)

## 2022-08-06 NOTE — Interval H&P Note (Signed)
History and Physical Interval Note:  08/06/2022 10:01 AM  Clarence Dawson  has presented today for surgery, with the diagnosis of atrial fibrillation.  The various methods of treatment have been discussed with the patient and family. After consideration of risks, benefits and other options for treatment, the patient has consented to  Procedure(s): CARDIOVERSION (N/A) as a surgical intervention.  The patient's history has been reviewed, patient examined, no change in status, stable for surgery.  I have reviewed the patient's chart and labs.  Questions were answered to the patient's satisfaction.     Donato Heinz

## 2022-08-06 NOTE — Anesthesia Preprocedure Evaluation (Signed)
Anesthesia Evaluation  Patient identified by MRN, date of birth, ID band Patient awake    Reviewed: Allergy & Precautions, NPO status , Patient's Chart, lab work & pertinent test results  Airway Mallampati: II  TM Distance: >3 FB     Dental   Pulmonary asthma , sleep apnea , pneumonia, former smoker,    breath sounds clear to auscultation       Cardiovascular hypertension, + dysrhythmias + pacemaker  Rhythm:Regular Rate:Normal     Neuro/Psych    GI/Hepatic hiatal hernia, GERD  ,  Endo/Other  diabetesHypothyroidism Hyperthyroidism   Renal/GU      Musculoskeletal  (+) Arthritis ,   Abdominal   Peds  Hematology   Anesthesia Other Findings   Reproductive/Obstetrics                             Anesthesia Physical Anesthesia Plan  ASA: 3  Anesthesia Plan: MAC   Post-op Pain Management:    Induction: Intravenous  PONV Risk Score and Plan: 1 and Ondansetron and Treatment may vary due to age or medical condition  Airway Management Planned: Nasal Cannula and Simple Face Mask  Additional Equipment:   Intra-op Plan:   Post-operative Plan:   Informed Consent: I have reviewed the patients History and Physical, chart, labs and discussed the procedure including the risks, benefits and alternatives for the proposed anesthesia with the patient or authorized representative who has indicated his/her understanding and acceptance.     Dental advisory given  Plan Discussed with: CRNA and Anesthesiologist  Anesthesia Plan Comments:         Anesthesia Quick Evaluation

## 2022-08-07 LAB — CUP PACEART REMOTE DEVICE CHECK
Battery Remaining Longevity: 110 mo
Battery Remaining Percentage: 95.5 %
Battery Voltage: 3.05 V
Brady Statistic AP VP Percent: 1 %
Brady Statistic AP VS Percent: 88 %
Brady Statistic AS VP Percent: 1 %
Brady Statistic AS VS Percent: 11 %
Brady Statistic RA Percent Paced: 6 %
Brady Statistic RV Percent Paced: 50 %
Date Time Interrogation Session: 20230810110815
Implantable Lead Implant Date: 20051106
Implantable Lead Implant Date: 20051106
Implantable Lead Location: 753859
Implantable Lead Location: 753860
Implantable Pulse Generator Implant Date: 20140115
Lead Channel Impedance Value: 340 Ohm
Lead Channel Impedance Value: 590 Ohm
Lead Channel Pacing Threshold Amplitude: 0.75 V
Lead Channel Pacing Threshold Pulse Width: 0.5 ms
Lead Channel Sensing Intrinsic Amplitude: 1.7 mV
Lead Channel Sensing Intrinsic Amplitude: 10.2 mV
Lead Channel Setting Pacing Amplitude: 2.5 V
Lead Channel Setting Pacing Amplitude: 2.5 V
Lead Channel Setting Pacing Pulse Width: 0.5 ms
Lead Channel Setting Sensing Sensitivity: 2 mV
Pulse Gen Model: 2210
Pulse Gen Serial Number: 7439597

## 2022-08-08 ENCOUNTER — Encounter: Payer: Self-pay | Admitting: Internal Medicine

## 2022-08-08 DIAGNOSIS — R0602 Shortness of breath: Secondary | ICD-10-CM

## 2022-08-08 DIAGNOSIS — I5032 Chronic diastolic (congestive) heart failure: Secondary | ICD-10-CM

## 2022-08-09 ENCOUNTER — Encounter (HOSPITAL_COMMUNITY): Payer: Self-pay | Admitting: Cardiology

## 2022-08-12 ENCOUNTER — Other Ambulatory Visit: Payer: Self-pay | Admitting: Internal Medicine

## 2022-08-13 ENCOUNTER — Ambulatory Visit (HOSPITAL_COMMUNITY)
Admission: RE | Admit: 2022-08-13 | Discharge: 2022-08-13 | Disposition: A | Discharge status: Home / Self Care | Payer: Medicare Other | Source: Ambulatory Visit | Attending: Student | Admitting: Student

## 2022-08-13 DIAGNOSIS — R0602 Shortness of breath: Secondary | ICD-10-CM | POA: Insufficient documentation

## 2022-08-13 DIAGNOSIS — I5032 Chronic diastolic (congestive) heart failure: Secondary | ICD-10-CM | POA: Diagnosis present

## 2022-08-13 LAB — ECHOCARDIOGRAM COMPLETE
AR max vel: 0.94 cm2
AV Area VTI: 0.99 cm2
AV Area mean vel: 1.03 cm2
AV Mean grad: 12.5 mmHg
AV Peak grad: 24.3 mmHg
Ao pk vel: 2.47 m/s
Area-P 1/2: 5.38 cm2
S' Lateral: 3.3 cm

## 2022-08-13 NOTE — Progress Notes (Signed)
*  PRELIMINARY RESULTS* Echocardiogram 2D Echocardiogram has been performed.  Clarence Dawson 08/13/2022, 9:23 AM

## 2022-08-18 NOTE — Progress Notes (Signed)
Electrophysiology Office Note Date: 08/26/2022  ID:  Clarence Dawson, Clarence Dawson 1946/10/14, MRN [id number]  PCP: [name], MD Primary Cardiologist: Cristopher Peru, MD Electrophysiologist: Cristopher Peru, MD   CC: Pacemaker follow-up  [name] is a 76 y.o. male seen today for Cristopher Peru, MD for routine electrophysiology followup.    Underwent [facility] 8/9.  He continued to have edema and fatigue.  Echo 08/13/2022 showed normal EF, Grade 2 DD.   Since last being seen in our clinic the patient doing better. His ability to get around without SOB has improved in NSR. No syncope. Mild peripheral edema R>L chronically.  He has "something in his lungs" that has not improved despite several rounds of antibiotics and treatment. Uses O2 prn.  Device History: [employer]. [employer]or, minerals PPM implanted 2005, gen change 2014 for SSS/Tachy-brady  Past Medical History:  Diagnosis Date   Allergic rhinitis    Aortic valve disorder    Asthma    since childhood- seasonal allergies induced   Cancer ([place])    Skin cancer- squamous, basal   Carotid artery stenosis    Essential hypertension    Full dentures    GERD (gastroesophageal reflux disease)    H/O hiatal hernia    Hemorrhage of rectum    Hyperlipidemia    Hypothyroidism    Male circumcision    OSA (obstructive sleep apnea)    Osteoarthritis    Pacemaker    Oct 2005 in [place].   PAF (paroxysmal atrial fibrillation) ([place])    Pneumonia    "several Times" 2015 last time   Primary localized osteoarthritis of right knee 08/11/2017   RBBB (right bundle branch block)    Sinoatrial node dysfunction (HCC)    Syncope    Tricuspid valve disorder    Type 2 diabetes mellitus ([place])    Type II   Past Surgical History:  Procedure Laterality Date   A-V CARDIAC PACEMAKER INSERTION     Sick sinus syndrome DDR pacer   Arthropathy Right 2005   Rebuilding of left thumb and joint    CARDIAC CATHETERIZATION     CARDIAC ELECTROPHYSIOLOGY STUDY AND ABLATION   09/2008   for pvcs, Dr. Loralie Champagne   CARDIOVERSION N/A 12/18/2016   Procedure: CARDIOVERSION;  Surgeon: Pixie Casino, MD;  Location: [facility] ENDOSCOPY;  Service: Cardiovascular;  Laterality: N/A;   CARDIOVERSION N/A 09/05/2021   Procedure: CARDIOVERSION;  Surgeon: Geralynn Rile, MD;  Location: [place];  Service: Cardiovascular;  Laterality: N/A;   CARDIOVERSION N/A 08/06/2022   Procedure: CARDIOVERSION;  Surgeon: Donato Heinz, MD;  Location: [facility] ENDOSCOPY;  Service: Cardiovascular;  Laterality: N/A;   [place]   right wrist   CARPAL TUNNEL RELEASE  05/04/2012   Procedure: CARPAL TUNNEL RELEASE;  Surgeon: Wynonia Sours, MD;  Location: [place];  Service: Orthopedics;  Laterality: Left;   CARPOMETACARPEL SUSPENSION PLASTY Right 11/16/2014   Procedure: SUSPENSIONPLASTY RIGHT THUMB TENDON TRANSFER ABDUCTOR POLLICUS LONGUS EXCISION TRAPEZIUM;  Surgeon: Daryll Brod, MD;  Location: [place];  Service: Orthopedics;  Laterality: Right;   CHOLECYSTECTOMY  1994   CIRCUMCISION     COLONOSCOPY N/A 03/14/2013   Procedure: COLONOSCOPY;  Surgeon: Daneil Dolin, MD;  [facility];  Service: Endoscopy;  Laterality: N/A;  8:15 AM   EYE SURGERY     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-[facility]   EYE SURGERY  2012   Left eye Corneal transplant- partial- Cataract  FLEXIBLE BRONCHOSCOPY Right 06/23/2019   Procedure: FLEXIBLE BRONCHOSCOPY RIGHT;  Surgeon: [name], MD;  Location: [facility] ORS;  Service: Pulmonary;  Laterality: Right;   GALLBLADDER SURGERY  12/01/2006   HAND TENDON SURGERY Left late 1990's   thumb   HEMORROIDECTOMY  2003   INJECTION KNEE Left 08/26/2017   Procedure: LEFT KNEE INJECTION;  Surgeon: Elsie Saas, MD;  Location: [place];  Service: Orthopedics;  Laterality: Left;   left Knee Arthroscopy     [facility]   PARTIAL KNEE ARTHROPLASTY  11/22/2012   Procedure: UNICOMPARTMENTAL KNEE;   Surgeon: Lorn Junes, MD;  Location: [place];  Service: Orthopedics;  Laterality: Left;  left unicompartmental knee arthroplasty   PERMANENT PACEMAKER GENERATOR CHANGE N/A 01/12/2013   Procedure: PERMANENT PACEMAKER GENERATOR CHANGE;  Surgeon: Evans Lance, MD;  Location: [facility] CATH LAB;  Service: Cardiovascular;  Laterality: N/A;   PPM GENERATOR CHANGEOUT N/A 07/07/2022   Procedure: PPM GENERATOR CHANGEOUT;  Surgeon: Evans Lance, MD;  Location: [place] CV LAB;  Service: Cardiovascular;  Laterality: N/A;   Rotator cuff Surgery  2001   Right shoulder   TONSILLECTOMY     TOTAL KNEE ARTHROPLASTY Right 08/26/2017   Procedure: TOTAL KNEE ARTHROPLASTY;  Surgeon: Elsie Saas, MD;  Location: [place];  Service: Orthopedics;  Laterality: Right;   varicose vein reduction      Current Outpatient Medications  Medication Sig Dispense Refill   acetaminophen (TYLENOL) 500 MG tablet Take 1,000 mg by mouth 2 (two) times daily.     albuterol (PROVENTIL HFA;VENTOLIN HFA) 108 (90 Base) MCG/ACT inhaler Inhale 2 puffs into the lungs every 4 (four) hours as needed for shortness of breath (only if you can't catch your breath/ asthma).     atorvastatin (LIPITOR) 20 MG tablet Take 20 mg by mouth at bedtime.     bisoprolol (ZEBETA) 5 MG tablet TAKE 1 AND 1/2 TABLET BY MOUTH TWICE DAILY. 90 tablet 3   Budeson-Glycopyrrol-Formoterol (BREZTRI AEROSPHERE) 160-9-4.8 MCG/ACT AERO Inhale 2 puffs into the lungs in the morning and at bedtime. 10.7 g 11   Carboxymethylcellul-Glycerin (LUBRICATING EYE DROPS OP) Place 1 drop into the right eye daily as needed (dry eyes).     CARTIA XT 120 MG 24 hr capsule TAKE (1) CAPSULE BY MOUTH DAILY, MAY TAKE A EXTRA CAPSULE DAILY FOR BREAKTHROUGH AFIB. (Patient taking differently: Take 120 mg by mouth daily.) 180 capsule 2   diphenhydramine-acetaminophen (TYLENOL PM) 25-500 MG TABS tablet Take 1 tablet by mouth at bedtime as needed (sleep/pain).     EPINEPHrine 0.3 mg/0.3 mL IJ SOAJ  injection Inject 0.3 mg into the muscle as needed for anaphylaxis. 1 each 5   flecainide (TAMBOCOR) 150 MG tablet Take 1/2 tablet by mouth 2 times per day. Please keep upcoming appointment for future refills. Thank you. 90 tablet 0   furosemide (LASIX) 40 MG tablet TAKE 1 TABLET BY MOUTH DAILY 90 tablet 3   glipiZIDE (GLUCOTROL XL) 5 MG 24 hr tablet Take 5 mg by mouth daily before breakfast.     hydrocortisone cream 0.5 % Apply 1 Application topically daily as needed for itching.     JARDIANCE 25 MG TABS tablet Take 25 mg by mouth daily before breakfast.     levothyroxine (SYNTHROID) 150 MCG tablet Take 150 mcg by mouth daily before breakfast.     losartan (COZAAR) 50 MG tablet Take 50 mg by mouth at bedtime.     metFORMIN (GLUCOPHAGE-XR) 500 MG 24  hr tablet Take 500 mg by mouth in the morning and at bedtime.     Multiple Vitamin (MULTIVITAMIN) tablet Take 1 tablet by mouth daily.     omeprazole (PRILOSEC OTC) 20 MG tablet Take 20 mg by mouth every morning.     Respiratory Therapy Supplies (FLUTTER) DEVI Use as directed 1 each 0   rivaroxaban (XARELTO) 20 MG TABS tablet Take 20 mg by mouth at bedtime.     XOLAIR 150 MG/ML prefilled syringe INJECT 300MG SUBCUTANEOUSLY EVERY 4 WEEKS 2 mL 11   levothyroxine (SYNTHROID) 137 MCG tablet Take 137 mcg by mouth daily.     No current facility-administered medications for this visit.    Allergies:   Food, Iodinated contrast media, Shellfish allergy, Goat-derived products, Prednisone, Metformin and related, Vancomycin, and Voltaren [diclofenac sodium]   Social History: Social History   Socioeconomic History   Marital status: Widowed    Spouse name: Not on file   Number of children: Not on file   Years of education: Not on file   Highest education level: Not on file  Occupational History   Not on file  Tobacco Use   Smoking status: Former    Packs/day: 3.00    Years: 25.00    Total pack years: 75.00    Types: Cigarettes    Start date:  02/20/1958    Quit date: 02/12/1984    Years since quitting: 38.5   Smokeless tobacco: Former    Types: Chew   Tobacco comments:    Former smoker 08/30/2021  Vaping Use   Vaping Use: Never used  Substance and Sexual Activity   Alcohol use: Yes    Alcohol/week: 2.0 - 4.0 standard drinks of alcohol    Types: 1 - 2 Cans of beer, 1 - 2 Glasses of wine per week   Drug use: No   Sexual activity: Not on file  Other Topics Concern   Not on file  Social History Narrative   Regular exercise: No   Social Determinants of Health   Financial Resource Strain: Not on file  Food Insecurity: Not on file  Transportation Needs: Not on file  Physical Activity: Not on file  Stress: Not on file  Social Connections: Not on file  Intimate Partner Violence: Not on file    Family History: Family History  Problem Relation Age of Onset   Liver cancer Mother    Pancreatic cancer Mother    Colon cancer Mother    Hypertension Father    Stroke Father    Other Father 16       Sudden Cardiac death   Heart attack Father    Cancer Sister        brain   Diabetes Sister    Colon cancer Maternal Aunt    Colon polyps Neg Hx      Review of Systems: All other systems reviewed and are otherwise negative except as noted above.  Physical Exam: Vitals:   08/26/22 0745  BP: 112/70  Pulse: 60  SpO2: 92%  Weight: 228 lb (103.4 kg)  Height: _0  (1.753 m)      GEN- The patient is well appearing, alert and oriented x 3 today.   HEENT: normocephalic, atraumatic; sclera clear, conjunctiva pink; hearing intact; oropharynx clear; neck supple  Lungs- Clear to ausculation bilaterally, normal work of breathing.  No wheezes, rales, rhonchi Heart- Regular rate and rhythm, no murmurs, rubs or gallops  GI- soft, non-tender, non-distended, bowel sounds present  Extremities- no  clubbing or cyanosis. No edema MS- no significant deformity or atrophy Skin- warm and dry, no rash or lesion; PPM pocket well  healed Psych- euthymic mood, full affect Neuro- strength and sensation are intact  PPM Interrogation- reviewed in detail today,  See PACEART report  EKG:  EKG is not ordered today.  Recent Labs: 07/29/2022: BUN 24; Creatinine, Ser 1.04; Hemoglobin 16.6; Platelets 170; Potassium 4.7; Sodium 142   Wt Readings from Last 3 Encounters:  08/26/22 228 lb (103.4 kg)  08/06/22 223 lb (101.2 kg)  08/04/22 234 lb (106.1 kg)     Other studies Reviewed: Additional studies/ records that were reviewed today include: Previous EP office notes, Previous remote checks, Most recent labwork.   Assessment and Plan:  1. Tachy-Brady syndrome s/p St. Jude PPM  Normal PPM function for device s/p gen change 07/07/2022 See Pace Art report No changes today   2. Persistent atrial fibrillation Maintaining NSR since [facility].  Continue Xarelto.  If has more AF could consider tikosyn. Poor candidate for amiodarone given complex lung disease.   3. Chronic diastolic CHF Volume status OK.  NYHA II-III symptoms, confounded by COPD  3. COPD 4. Asthma 5. Bronchiectasis Followed by pulmonology.   Current medicines are reviewed at length with the patient today.    Labs/ tests ordered today include:  No orders of the defined types were placed in this encounter.   Disposition:   Follow up with EP APP in 4 weeks    Signed, Shirley Friar, PA-C  08/26/2022 8:07 AM  [facility] [address] [place] [address] [phone] (office) [phone] (fax)

## 2022-08-19 NOTE — Progress Notes (Signed)
Remote pacemaker transmission.   

## 2022-08-20 ENCOUNTER — Ambulatory Visit: Payer: Medicare Other | Admitting: Diagnostic Neuroimaging

## 2022-08-26 ENCOUNTER — Encounter: Payer: Self-pay | Admitting: Student

## 2022-08-26 ENCOUNTER — Ambulatory Visit: Payer: Medicare Other | Attending: Student | Admitting: Student

## 2022-08-26 VITALS — BP 112/70 | HR 60 | Ht 69.0 in | Wt 228.0 lb

## 2022-08-26 DIAGNOSIS — I5032 Chronic diastolic (congestive) heart failure: Secondary | ICD-10-CM

## 2022-08-26 DIAGNOSIS — I495 Sick sinus syndrome: Secondary | ICD-10-CM | POA: Diagnosis not present

## 2022-08-26 DIAGNOSIS — I4819 Other persistent atrial fibrillation: Secondary | ICD-10-CM

## 2022-08-26 LAB — CUP PACEART INCLINIC DEVICE CHECK
Battery Remaining Longevity: 109 mo
Battery Voltage: 3.02 V
Brady Statistic RA Percent Paced: 53 %
Brady Statistic RV Percent Paced: 23 %
Date Time Interrogation Session: 20230829082134
Implantable Lead Implant Date: 20051106
Implantable Lead Implant Date: 20051106
Implantable Lead Location: 753859
Implantable Lead Location: 753860
Implantable Pulse Generator Implant Date: 20230710
Lead Channel Impedance Value: 337.5 Ohm
Lead Channel Impedance Value: 687.5 Ohm
Lead Channel Pacing Threshold Amplitude: 0.75 V
Lead Channel Pacing Threshold Amplitude: 0.75 V
Lead Channel Pacing Threshold Amplitude: 1 V
Lead Channel Pacing Threshold Amplitude: 1 V
Lead Channel Pacing Threshold Pulse Width: 0.5 ms
Lead Channel Pacing Threshold Pulse Width: 0.5 ms
Lead Channel Pacing Threshold Pulse Width: 0.5 ms
Lead Channel Pacing Threshold Pulse Width: 0.5 ms
Lead Channel Sensing Intrinsic Amplitude: 1.9 mV
Lead Channel Sensing Intrinsic Amplitude: 12 mV
Lead Channel Setting Pacing Amplitude: 2.5 V
Lead Channel Setting Pacing Amplitude: 2.5 V
Lead Channel Setting Pacing Pulse Width: 0.5 ms
Lead Channel Setting Sensing Sensitivity: 2 mV
Pulse Gen Model: 2272
Pulse Gen Serial Number: 8099041

## 2022-08-26 NOTE — Patient Instructions (Addendum)
Medication Instructions:  Your physician recommends that you continue on your current medications as directed. Please refer to the Current Medication list given to you today.  *If you need a refill on your cardiac medications before your next appointment, please call your pharmacy*   Lab Work: None If you have labs (blood work) drawn today and your tests are completely normal, you will receive your results only by: MyChart Message (if you have MyChart) OR A paper copy in the mail If you have any lab test that is abnormal or we need to change your treatment, we will call you to review the results.   Follow-Up: At [facility], you and your health needs are our priority.  As part of our continuing mission to provide you with exceptional heart care, we have created designated Provider Care Teams.  These Care Teams include your primary Cardiologist (physician) and Advanced Practice Providers (APPs -  Physician Assistants and Nurse Practitioners) who all work together to provide you with the care you need, when you need it.   Your next appointment:   As scheduled 

## 2022-09-09 NOTE — Progress Notes (Signed)
Remote pacemaker transmission.   

## 2022-09-25 ENCOUNTER — Encounter: Payer: Self-pay | Admitting: Pulmonary Disease

## 2022-09-25 ENCOUNTER — Ambulatory Visit (INDEPENDENT_AMBULATORY_CARE_PROVIDER_SITE_OTHER): Payer: Medicare Other | Admitting: Pulmonary Disease

## 2022-09-25 VITALS — BP 122/60 | HR 60 | Temp 97.3°F | Ht 69.0 in | Wt 227.0 lb

## 2022-09-25 DIAGNOSIS — J329 Chronic sinusitis, unspecified: Secondary | ICD-10-CM

## 2022-09-25 DIAGNOSIS — J449 Chronic obstructive pulmonary disease, unspecified: Secondary | ICD-10-CM | POA: Diagnosis not present

## 2022-09-25 DIAGNOSIS — J310 Chronic rhinitis: Secondary | ICD-10-CM

## 2022-09-25 DIAGNOSIS — J4550 Severe persistent asthma, uncomplicated: Secondary | ICD-10-CM

## 2022-09-25 DIAGNOSIS — J470 Bronchiectasis with acute lower respiratory infection: Secondary | ICD-10-CM | POA: Diagnosis not present

## 2022-09-25 DIAGNOSIS — B4481 Allergic bronchopulmonary aspergillosis: Secondary | ICD-10-CM

## 2022-09-25 MED ORDER — DUPIXENT 300 MG/2ML ~~LOC~~ SOAJ: 300.0000 mg | SUBCUTANEOUS | 11 refills | Status: DC

## 2022-09-25 NOTE — Patient Instructions (Signed)
We are switching your Xolair to Dupixent.  Continue using your vest, continue oxygen, continue your inhaler medications and nebulizers.  We will see you in follow-up in 2 to 3 months time call sooner should any new problems arise.

## 2022-09-25 NOTE — Progress Notes (Signed)
Subjective:    Patient ID: Clarence Dawson, male    DOB: 05/17/1946, 76 y.o.   MRN: [id number] Patient Care Team: Clarence Noble, MD as PCP - General (Internal Medicine) Clarence Lance, MD as PCP - Cardiology (Cardiology) Clarence Lance, MD as PCP - Electrophysiology (Cardiology)  Chief Complaint  Patient presents with   Follow-up    Currently on Augmentin for sinus infection per PCP. He has some cough with brown/green sputum.     HPI Patient is a 76 year old former smoker (24 PY) who presents for follow-up of asthma with COPD overlap, bronchiectasis and chronic respiratory failure with hypoxia.  He was last seen on 25 June 2022.  At that time he was noted to be in A-fib, he was also noticing that [place], which he has been on for control of asthma, was not as effective as previous.This is a scheduled visit.  Patient is compliant with Breztri.  He is compliant with vest physiotherapy and supplemental oxygen.Clarence Dawson  His only complaint is that of increased congestion in the mornings which usually clears after he drinks a second cup of coffee in the morning.  He notes that he is on CPAP for sleep apnea and sometimes the humidifier reservoir is empty in the mornings.  This is managed by his primary care physician.  Currently he is having some increased stress due to his wife being diagnosed with recurrent cancer.  Since his prior visit he has had a repeat cardioversion, he appears to be in sinus rhythm currently.  He continues to note that Xolair which used to be very effective previously now only lasts approximately 2 weeks and then he goes back to feeling poorly again.  He is currently on Augmentin for a sinus infection.  Had a recent trip to [place] to visit family and was exposed to sick grandchildren.  He came back with increased nasal congestion and increased cough and sputum production.  Avoiding systemic steroids because this usually leads to recurrence of his A-fib.  He does not endorse any  other symptomatology.  No fevers, chills or sweats.  Cough is productive of yellowish sputum.  Copious, does expectorate quite a bit after vest physiotherapy.  Review of Systems A 10 point review of systems was performed and it is as noted above otherwise negative.  Patient Active Problem List   Diagnosis Date Noted   Secondary hypercoagulable state ([place]) 01/02/2020   Chronic diastolic heart failure ([place]) 11/22/2019   Paroxysmal atrial fibrillation ([place]) 09/22/2019   Atrial fibrillation with RVR ([place])    Lobar pneumonia ([place]) 09/21/2019   Acute respiratory failure with hypoxia ([place]) 09/21/2019   Acute on chronic respiratory failure ([place]) 09/18/2019   Severe sepsis (HCC)    Elevated lactic acid level    GERD (gastroesophageal reflux disease)    Chronic bronchitis ([place]) 02/09/2019   CAP (community acquired pneumonia) 03/03/2018   Primary localized osteoarthritis of right knee 08/11/2017   Asthma with acute exacerbation 12/08/2016   Chest pain 10/20/2016   HTN (hypertension) 10/20/2016   Morbid obesity due to excess calories ([place]) 06/24/2016   Severe persistent asthma 02/12/2016   Upper airway cough syndrome 01/24/2016   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   Persistent atrial fibrillation    Syncope    Hyperthyroidism    Hyperlipidemia    RBBB (right bundle branch block)    Tricuspid valve disorder    Aortic valve disorder    Carotid artery stenosis    Type 2 diabetes mellitus ([place])  Pacemaker    OSA (obstructive sleep apnea)    Arthritis    Sinoatrial node dysfunction (HCC)    PVC's (premature ventricular contractions) 07/17/2011   Essential hypertension, benign 01/24/2011   Premature ventricular contractions 01/24/2011   PPM-St.Jude 01/24/2011   Social History   Tobacco Use   Smoking status: Former    Packs/day: 3.00    Years: 25.00    Total pack years: 75.00    Types: Cigarettes    Start date: 02/20/1958    Quit date: 02/12/1984    Years since quitting: 38.6   Smokeless  tobacco: Former    Types: Chew   Tobacco comments:    Former smoker 08/30/2021  Substance Use Topics   Alcohol use: Yes    Alcohol/week: 2.0 - 4.0 standard drinks of alcohol    Types: 1 - 2 Cans of beer, 1 - 2 Glasses of wine per week   Allergies  Allergen Reactions   Food Anaphylaxis and Shortness Of Breath    TREE NUTS   Iodinated Contrast Media Hives and Shortness Of Breath    Patient states hives to throat closing. (01/15/17: patient states this reaction was "about 20 years ago" with possibly an IVP.  He now says high doses of prednisone "throw me into AFib."  He has tolerated CT arthrograms with Benadrly 50mg  PO one hour before injection.  Brita Romp, RN)   Shellfish Allergy Anaphylaxis and Shortness Of Breath    To shellfish, crabs.  Makes him feel like "things are crawling all over" me.  Denies airway issues with these foods.  Brita Romp, RN 01/15/17)   Goat-Derived Products Hives    GOAT CHEESE    Prednisone Palpitations    PRECIPITATES A-FIB   Metformin And Related Diarrhea    High doses at once   Vancomycin Anxiety    Red man syndrome   Voltaren [Diclofenac Sodium] Other (See Comments)    Feels like things are crawling on him   Current Meds  Medication Sig   acetaminophen (TYLENOL) 500 MG tablet Take 1,000 mg by mouth 2 (two) times daily.   albuterol (PROVENTIL HFA;VENTOLIN HFA) 108 (90 Base) MCG/ACT inhaler Inhale 2 puffs into the lungs every 4 (four) hours as needed for shortness of breath (only if you can't catch your breath/ asthma).   amoxicillin-clavulanate (AUGMENTIN) 875-125 MG tablet Take 1 tablet by mouth 2 (two) times daily.   atorvastatin (LIPITOR) 20 MG tablet Take 20 mg by mouth at bedtime.   bisoprolol (ZEBETA) 5 MG tablet TAKE 1 AND 1/2 TABLET BY MOUTH TWICE DAILY.   Budeson-Glycopyrrol-Formoterol (BREZTRI AEROSPHERE) 160-9-4.8 MCG/ACT AERO Inhale 2 puffs into the lungs in the morning and at bedtime.   Carboxymethylcellul-Glycerin (LUBRICATING EYE DROPS  OP) Place 1 drop into the right eye daily as needed (dry eyes).   CARTIA XT 120 MG 24 hr capsule TAKE (1) CAPSULE BY MOUTH DAILY, MAY TAKE A EXTRA CAPSULE DAILY FOR BREAKTHROUGH AFIB. (Patient taking differently: Take 120 mg by mouth daily.)   diphenhydramine-acetaminophen (TYLENOL PM) 25-500 MG TABS tablet Take 1 tablet by mouth at bedtime as needed (sleep/pain).   EPINEPHrine 0.3 mg/0.3 mL IJ SOAJ injection Inject 0.3 mg into the muscle as needed for anaphylaxis.   flecainide (TAMBOCOR) 150 MG tablet Take 1/2 tablet by mouth 2 times per day. Please keep upcoming appointment for future refills. Thank you.   furosemide (LASIX) 40 MG tablet TAKE 1 TABLET BY MOUTH DAILY   glipiZIDE (GLUCOTROL XL) 5 MG 24 hr tablet  Take 5 mg by mouth daily before breakfast.   hydrocortisone cream 0.5 % Apply 1 Application topically daily as needed for itching.   JARDIANCE 25 MG TABS tablet Take 25 mg by mouth daily before breakfast.   levothyroxine (SYNTHROID) 150 MCG tablet Take 150 mcg by mouth daily before breakfast.   losartan (COZAAR) 50 MG tablet Take 50 mg by mouth at bedtime.   metFORMIN (GLUCOPHAGE-XR) 500 MG 24 hr tablet Take 500 mg by mouth in the morning and at bedtime.   Multiple Vitamin (MULTIVITAMIN) tablet Take 1 tablet by mouth daily.   omeprazole (PRILOSEC OTC) 20 MG tablet Take 20 mg by mouth every morning.   Respiratory Therapy Supplies (FLUTTER) DEVI Use as directed   rivaroxaban (XARELTO) 20 MG TABS tablet Take 20 mg by mouth at bedtime.   XOLAIR 150 MG/ML prefilled syringe INJECT 300MG  SUBCUTANEOUSLY EVERY 4 WEEKS   Immunization History  Administered Date(s) Administered   Fluad Quad(high Dose 65+) 09/12/2019   Influenza Split 11/14/2015, 10/11/2018   Influenza, High Dose Seasonal PF 09/28/2017   Influenza-Unspecified 11/02/2016, 09/03/2020, 10/03/2021   Moderna SARS-COV2 Booster Vaccination 08/20/2020, 03/04/2021   Moderna Sars-Covid-2 Vaccination 02/15/2020, 03/14/2020   Pneumococcal  Conjugate-13 09/28/2015   Tdap 11/28/2019      Objective:   Physical Exam BP 122/60 (BP Location: Left Arm, Cuff Size: Normal)   Pulse 60   Temp (!) 97.3 F (36.3 C) (Oral)   Ht 5\' 9"  (1.753 m)   Wt 227 lb (103 kg)   SpO2 90%   BMI 33.52 kg/m  GENERAL: Well-developed, obese patient in no acute distress.  He is comfortable on nasal cannula O2.  He is fully ambulatory. HEAD: Normocephalic, atraumatic.  EYES: Pupils equal, round, reactive to light.  No scleral icterus.  MOUTH: Nose/mouth/throat not examined due to masking requirements for COVID 19.   NECK: Supple. No thyromegaly. Trachea midline. No JVD.  No adenopathy. PULMONARY: Good air entry bilaterally.  Symmetrical, nonlabored, few end expiratory wheezes few rhonchi. CARDIOVASCULAR: S1 and S2.  Regular rate and rhythm, NSR, rate 60.  Grade 2/6 mitral regurgitation murmur. Pacemaker on left. GASTROINTESTINAL: Obese otherwise benign. MUSCULOSKELETAL: No joint deformity, no clubbing, no edema.  NEUROLOGIC: No focal deficits, speech is fluent.  No gait disturbance noted.  Awake, alert and oriented. SKIN: Intact,warm,dry. PSYCH: Mood and behavior appropriate.     Assessment & Plan:     ICD-10-CM   1. Severe persistent asthma without complication  Z76.73    Not well compensated Switch Xolair to [place], continue as needed albuterol Avoiding systemic steroids due to issues with A-fib recurrence    2. Asthma-COPD overlap syndrome  J44.9    Continue Breztri and as needed albuterol    3. Bronchiectasis with acute lower respiratory infection ([place])  J47.0    Continue vest physiotherapy Complete antibiotics     4. Chronic rhinosinusitis  J31.0    J32.9    Recent flare of sinusitis Continue nasal hygiene Complete antibiotics    5. 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    Dispense:  3.92 mL    Refill:  11   Patient has noted diminished response to Xolair.  We will discontinue Xolair and switch to Dupixent.  We will  see the patient in follow-up in 2 to 3 months time he is to contact us prior to that time should any new difficulties arise.  He is aware that he would likely need to have a visit at our [place] office for his initial Dupixent dosing.  Renold Don, MD Advanced Bronchoscopy PCCM [facility]    *This note was dictated using voice recognition software/Dragon.  Despite best efforts to proofread, errors can occur which can change the meaning. Any transcriptional errors that result from this process are unintentional and may not be fully corrected at the time of dictation.

## 2022-09-26 ENCOUNTER — Other Ambulatory Visit (HOSPITAL_COMMUNITY): Payer: Self-pay

## 2022-09-26 ENCOUNTER — Telehealth: Payer: Self-pay

## 2022-09-26 DIAGNOSIS — J4550 Severe persistent asthma, uncomplicated: Secondary | ICD-10-CM

## 2022-09-26 NOTE — Telephone Encounter (Signed)
Dupixent enrollment forms have been started and faxed to pharmacy team.

## 2022-09-26 NOTE — Telephone Encounter (Signed)
Patient Advocate Encounter   Received notification that prior authorization is required for Dupixent 300MG /2ML pen-injectors  Submitted: 09-26-2022 Key [id number]

## 2022-09-29 NOTE — Telephone Encounter (Signed)
Received a fax regarding Prior Authorization from [facility] for [place]. Authorization has been DENIED because patient must have one of the following: (A) You have had two or more asthma exacerbations requiring systemic corticosteroids within the past 12 months. (B) You had prior asthma-related hospitalization within the past 12 months..  Will need to pursue appeal  Knox Saliva, PharmD, MPH, BCPS, CPP Clinical Pharmacist (Rheumatology and Pulmonology)

## 2022-10-01 NOTE — Telephone Encounter (Signed)
New start paperwork placed in "pending info" folder.

## 2022-10-02 ENCOUNTER — Other Ambulatory Visit: Payer: Self-pay | Admitting: Pulmonary Disease

## 2022-10-03 ENCOUNTER — Other Ambulatory Visit (HOSPITAL_COMMUNITY): Payer: Self-pay

## 2022-10-13 ENCOUNTER — Encounter: Payer: Medicare Other | Admitting: Internal Medicine

## 2022-10-17 ENCOUNTER — Ambulatory Visit: Payer: Medicare Other | Attending: Internal Medicine | Admitting: Internal Medicine

## 2022-10-17 ENCOUNTER — Encounter: Payer: Self-pay | Admitting: Internal Medicine

## 2022-10-17 VITALS — BP 132/68 | HR 58 | Ht 69.0 in | Wt 230.0 lb

## 2022-10-17 DIAGNOSIS — I5032 Chronic diastolic (congestive) heart failure: Secondary | ICD-10-CM | POA: Diagnosis not present

## 2022-10-17 NOTE — Patient Instructions (Signed)
Medication Instructions:  Your physician recommends that you continue on your current medications as directed. Please refer to the Current Medication list given to you today.  *If you need a refill on your cardiac medications before your next appointment, please call your pharmacy*   Lab Work: None ordered   Testing/Procedures: None ordered   Follow-Up: At [facility], you and your health needs are our priority.  As part of our continuing mission to provide you with exceptional heart care, we have created designated Provider Care Teams.  These Care Teams include your primary Cardiologist (physician) and Advanced Practice Providers (APPs -  Physician Assistants and Nurse Practitioners) who all work together to provide you with the care you need, when you need it.  Remote monitoring is used to monitor your Pacemaker or ICD from home. This monitoring reduces the number of office visits required to check your device to one time per year. It allows [place] to keep an eye on the functioning of your device to ensure it is working properly. You are scheduled for a device check from home on 11/18/2022. You may send your transmission at any time that day. If you have a wireless device, the transmission will be sent automatically. After your physician reviews your transmission, you will receive a postcard with your next transmission date.  Your next appointment:   1 year(s)  The format for your next appointment:   In Person  Provider:   Cristopher Peru, MD    Thank you for choosing [facility]!!   Trinidad Curet, RN [phone]    Other Instructions   Important Information About [place]

## 2022-10-17 NOTE — Telephone Encounter (Signed)
Submitted an appeal to [facility] for [place]. Turnaround time is 7 business days  Reference # [id number] Phone: [phone] Fax: [phone]  Knox Saliva, PharmD, MPH, BCPS, CPP Clinical Pharmacist (Rheumatology and Pulmonology)

## 2022-10-17 NOTE — Progress Notes (Signed)
HPI Clarence Dawson returns today for followup. He is a pleasant 76 yo man with a h/o sinus node dysfunction, s/p PPM insertion, and PAF. He had sepsis and developed recurrent atrial fib. He has not had any bleeding. On flecainide, he had maintained NSR but has recently developed atrial fib for the past 3 months. He may be a little more sob. No significant palpitations. Allergies  Allergen Reactions   Food Anaphylaxis and Shortness Of Breath    TREE NUTS   Iodinated Contrast Media Hives and Shortness Of Breath    Patient states hives to throat closing. (01/15/17: patient states this reaction was "about 20 years ago" with possibly an IVP.  He now says high doses of prednisone "throw me into AFib."  He has tolerated CT arthrograms with Benadrly 80m PO one hour before injection.  JBrita Romp RN)   Shellfish Allergy Anaphylaxis and Shortness Of Breath    To shellfish, crabs.  Makes him feel like "things are crawling all over" me.  Denies airway issues with these foods.  (Brita Romp RN 01/15/17)   Goat-Derived Products Hives    GOAT CHEESE    Prednisone Palpitations    PRECIPITATES A-FIB   Metformin And Related Diarrhea    High doses at once   Vancomycin Anxiety    Red man syndrome   Voltaren [Diclofenac Sodium] Other (See Comments)    Feels like things are crawling on him     Current Outpatient Medications  Medication Sig Dispense Refill   acetaminophen (TYLENOL) 500 MG tablet Take 1,000 mg by mouth 2 (two) times daily.     albuterol (PROVENTIL HFA;VENTOLIN HFA) 108 (90 Base) MCG/ACT inhaler Inhale 2 puffs into the lungs every 4 (four) hours as needed for shortness of breath (only if you can't catch your breath/ asthma).     atorvastatin (LIPITOR) 20 MG tablet Take 20 mg by mouth at bedtime.     bisoprolol (ZEBETA) 5 MG tablet TAKE 1 AND 1/2 TABLET BY MOUTH TWICE DAILY. 90 tablet 3   BREZTRI AEROSPHERE 160-9-4.8 MCG/ACT AERO INHALE 2 PUFFS INTO THE LUNGS (IN THE MORNING AND AT  BEDTIME). 10.7 g 5   Carboxymethylcellul-Glycerin (LUBRICATING EYE DROPS OP) Place 1 drop into the right eye daily as needed (dry eyes).     CARTIA XT 120 MG 24 hr capsule TAKE (1) CAPSULE BY MOUTH DAILY, MAY TAKE A EXTRA CAPSULE DAILY FOR BREAKTHROUGH AFIB. (Patient taking differently: Take 120 mg by mouth daily.) 180 capsule 2   diphenhydramine-acetaminophen (TYLENOL PM) 25-500 MG TABS tablet Take 1 tablet by mouth at bedtime as needed (sleep/pain).     EPINEPHrine 0.3 mg/0.3 mL IJ SOAJ injection Inject 0.3 mg into the muscle as needed for anaphylaxis. 1 each 5   flecainide (TAMBOCOR) 150 MG tablet Take 1/2 tablet by mouth 2 times per day. Please keep upcoming appointment for future refills. Thank you. 90 tablet 0   furosemide (LASIX) 40 MG tablet TAKE 1 TABLET BY MOUTH DAILY 90 tablet 3   glipiZIDE (GLUCOTROL XL) 5 MG 24 hr tablet Take 5 mg by mouth daily before breakfast.     hydrocortisone cream 0.5 % Apply 1 Application topically daily as needed for itching.     JARDIANCE 25 MG TABS tablet Take 25 mg by mouth daily before breakfast.     levothyroxine (SYNTHROID) 150 MCG tablet Take 150 mcg by mouth daily before breakfast.     losartan (COZAAR) 50 MG tablet Take 50 mg by  mouth at bedtime.     metFORMIN (GLUCOPHAGE-XR) 500 MG 24 hr tablet Take 500 mg by mouth in the morning and at bedtime.     Multiple Vitamin (MULTIVITAMIN) tablet Take 1 tablet by mouth daily.     omeprazole (PRILOSEC OTC) 20 MG tablet Take 20 mg by mouth every morning.     Respiratory Therapy Supplies (FLUTTER) DEVI Use as directed 1 each 0   rivaroxaban (XARELTO) 20 MG TABS tablet Take 20 mg by mouth at bedtime.     Dupilumab (DUPIXENT) 300 MG/2ML SOPN Inject 300 mg into the skin every 14 (fourteen) days. (Patient not taking: Reported on 10/17/2022) 3.92 mL 11   No current facility-administered medications for this visit.     Past Medical History:  Diagnosis Date   Allergic rhinitis    Aortic valve disorder     Asthma    since childhood- seasonal allergies induced   Cancer ([place])    Skin cancer- squamous, basal   Carotid artery stenosis    Essential hypertension    Full dentures    GERD (gastroesophageal reflux disease)    H/O hiatal hernia    Hemorrhage of rectum    Hyperlipidemia    Hypothyroidism    Male circumcision    OSA (obstructive sleep apnea)    Osteoarthritis    Pacemaker    Oct 2005 in [place].   PAF (paroxysmal atrial fibrillation) ([place])    Pneumonia    "several Times" 2015 last time   Primary localized osteoarthritis of right knee 08/11/2017   RBBB (right bundle branch block)    Sinoatrial node dysfunction (HCC)    Syncope    Tricuspid valve disorder    Type 2 diabetes mellitus ([place])    Type II    ROS:   All systems reviewed and negative except as noted in the HPI.   Past Surgical History:  Procedure Laterality Date   A-V CARDIAC PACEMAKER INSERTION     Sick sinus syndrome DDR pacer   Arthropathy Right 2005   Rebuilding of left thumb and joint    CARDIAC CATHETERIZATION     CARDIAC ELECTROPHYSIOLOGY STUDY AND ABLATION  09/2008   for pvcs, Dr. Loralie Champagne   CARDIOVERSION N/A 12/18/2016   Procedure: CARDIOVERSION;  Surgeon: Pixie Casino, MD;  Location: [facility] ENDOSCOPY;  Service: Cardiovascular;  Laterality: N/A;   CARDIOVERSION N/A 09/05/2021   Procedure: CARDIOVERSION;  Surgeon: Geralynn Rile, MD;  Location: [place];  Service: Cardiovascular;  Laterality: N/A;   CARDIOVERSION N/A 08/06/2022   Procedure: CARDIOVERSION;  Surgeon: Donato Heinz, MD;  Location: [facility] ENDOSCOPY;  Service: Cardiovascular;  Laterality: N/A;   [place]   right wrist   CARPAL TUNNEL RELEASE  05/04/2012   Procedure: CARPAL TUNNEL RELEASE;  Surgeon: Wynonia Sours, MD;  Location: [place];  Service: Orthopedics;  Laterality: Left;   CARPOMETACARPEL SUSPENSION PLASTY Right 11/16/2014   Procedure: SUSPENSIONPLASTY RIGHT THUMB TENDON TRANSFER  ABDUCTOR POLLICUS LONGUS EXCISION TRAPEZIUM;  Surgeon: Daryll Brod, MD;  Location: [place];  Service: Orthopedics;  Laterality: Right;   CHOLECYSTECTOMY  1994   CIRCUMCISION     COLONOSCOPY N/A 03/14/2013   Procedure: COLONOSCOPY;  Surgeon: Daneil Dolin, MD;  [facility];  Service: Endoscopy;  Laterality: N/A;  8:15 AM   EYE SURGERY     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-[facility]   EYE SURGERY  2012   Left eye Corneal transplant- partial- Cataract   FLEXIBLE  BRONCHOSCOPY Right 06/23/2019   Procedure: FLEXIBLE BRONCHOSCOPY RIGHT;  Surgeon: [name], MD;  Location: [facility] ORS;  Service: Pulmonary;  Laterality: Right;   GALLBLADDER SURGERY  12/01/2006   HAND TENDON SURGERY Left late 1990's   thumb   HEMORROIDECTOMY  2003   INJECTION KNEE Left 08/26/2017   Procedure: LEFT KNEE INJECTION;  Surgeon: Elsie Saas, MD;  Location: [place];  Service: Orthopedics;  Laterality: Left;   left Knee Arthroscopy     [facility]   PARTIAL KNEE ARTHROPLASTY  11/22/2012   Procedure: UNICOMPARTMENTAL KNEE;  Surgeon: Lorn Junes, MD;  Location: [place];  Service: Orthopedics;  Laterality: Left;  left unicompartmental knee arthroplasty   PERMANENT PACEMAKER GENERATOR CHANGE N/A 01/12/2013   Procedure: PERMANENT PACEMAKER GENERATOR CHANGE;  Surgeon: Evans Lance, MD;  Location: [facility] CATH LAB;  Service: Cardiovascular;  Laterality: N/A;   PPM GENERATOR CHANGEOUT N/A 07/07/2022   Procedure: PPM GENERATOR CHANGEOUT;  Surgeon: Evans Lance, MD;  Location: [place] CV LAB;  Service: Cardiovascular;  Laterality: N/A;   Rotator cuff Surgery  2001   Right shoulder   TONSILLECTOMY     TOTAL KNEE ARTHROPLASTY Right 08/26/2017   Procedure: TOTAL KNEE ARTHROPLASTY;  Surgeon: Elsie Saas, MD;  Location: [place];  Service: Orthopedics;  Laterality: Right;   varicose vein reduction       Family History  Problem Relation Age of Onset   Liver cancer Mother     Pancreatic cancer Mother    Colon cancer Mother    Hypertension Father    Stroke Father    Other Father 74       Sudden Cardiac death   Heart attack Father    Cancer Sister        brain   Diabetes Sister    Colon cancer Maternal Aunt    Colon polyps Neg Hx      Social History   Socioeconomic History   Marital status: Widowed    Spouse name: Not on file   Number of children: Not on file   Years of education: Not on file   Highest education level: Not on file  Occupational History   Not on file  Tobacco Use   Smoking status: Former    Packs/day: 3.00    Years: 25.00    Total pack years: 75.00    Types: Cigarettes    Start date: 02/20/1958    Quit date: 02/12/1984    Years since quitting: 38.7   Smokeless tobacco: Former    Types: Chew   Tobacco comments:    Former smoker 08/30/2021  Vaping Use   Vaping Use: Never used  Substance and Sexual Activity   Alcohol use: Yes    Alcohol/week: 2.0 - 4.0 standard drinks of alcohol    Types: 1 - 2 Cans of beer, 1 - 2 Glasses of wine per week   Drug use: No   Sexual activity: Not on file  Other Topics Concern   Not on file  Social History Narrative   Regular exercise: No   Social Determinants of Health   Financial Resource Strain: Not on file  Food Insecurity: Not on file  Transportation Needs: Not on file  Physical Activity: Not on file  Stress: Not on file  Social Connections: Not on file  Intimate Partner Violence: Not on file     BP 132/68   Pulse (!) 58   Ht _0  (1.753 m)   Wt  230 lb (104.3 kg)   BMI 33.97 kg/m   Physical Exam:  Well appearing NAD HEENT: Unremarkable Neck:  No JVD, no thyromegally Lymphatics:  No adenopathy Back:  No CVA tenderness Lungs:  Clear HEART:  Regular rate rhythm, no murmurs, no rubs, no clicks Abd:  soft, positive bowel sounds, no organomegally, no rebound, no guarding Ext:  2 plus pulses, no edema, no cyanosis, no clubbing Skin:  No rashes no nodules Neuro:  CN II  through XII intact, motor grossly intact  EKG  DEVICE  Normal device function.  See PaceArt for details.   Assess/Plan: 1. Persistent atrial fib  - he will continue flecainide. As he is not symptomatic, I'll hold off on another DCCV. He will call us if he experiences more symptoms. 2. Sinus node dysfunction - he is asymptomatic, s/p PPM and is dependent down to 30/min. 3. PPM - his [employer] DDD PM is working normally. We recheck in several months. 4. 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 He will continue his current meds.    Carleene Overlie Michalle Rademaker,MD

## 2022-10-23 NOTE — Telephone Encounter (Signed)
Received fax from [facility] requesting additional information. Completed [place] appeal form and faxed with clinicals and [place]ORE[place] trial  Fax: [phone] Phone: [phone] ATTN: Gilman Buttner, PharmD, MPH, BCPS, CPP Clinical Pharmacist (Rheumatology and Pulmonology)

## 2022-10-30 ENCOUNTER — Encounter: Payer: Self-pay | Admitting: Pulmonary Disease

## 2022-10-30 NOTE — Telephone Encounter (Signed)
Pharmacy team, please advise. Thanks

## 2022-10-31 ENCOUNTER — Other Ambulatory Visit (HOSPITAL_COMMUNITY): Payer: Self-pay

## 2022-10-31 MED ORDER — DUPIXENT 300 MG/2ML ~~LOC~~ SOAJ
SUBCUTANEOUS | 0 refills | Status: DC
  Filled 2022-10-31: qty 8, fill #0
  Filled 2022-11-03: qty 8, 28d supply, fill #0

## 2022-10-31 NOTE — Telephone Encounter (Signed)
Have not yet received approval letter from insurance. Per test claim, copay for 28 days supply (loading dose) is $0. Patient can fill with [facility].  Rx sent to [facility] today to be couriered to clinic prior to new start visit. Appt on 11/05/2022.  Knox Saliva, PharmD, MPH, BCPS, CPP Clinical Pharmacist (Rheumatology and Pulmonology)

## 2022-10-31 NOTE — Telephone Encounter (Signed)
Patient scheduled for Dupixent new start on 11/05/2022  Knox Saliva, PharmD, MPH, BCPS, CPP Clinical Pharmacist (Rheumatology and Pulmonology)

## 2022-11-03 ENCOUNTER — Other Ambulatory Visit (HOSPITAL_COMMUNITY): Payer: Self-pay

## 2022-11-03 NOTE — Telephone Encounter (Signed)
Delivery instructions have been updated in [place], medication will be couriered to [facility] by 11/05/22.  Rx has been processed in [facility] and the patient has no copay at this time.

## 2022-11-04 ENCOUNTER — Other Ambulatory Visit (HOSPITAL_COMMUNITY): Payer: Self-pay

## 2022-11-04 NOTE — Progress Notes (Unsigned)
HPI Patient presents today to [facility] to see pharmacy team for Dupixent new start.  Past medical history includes asthma/COPD overlap, HTN, OSA, Afib, GERD, hypothyroidism, T2DM, HLD and arthritis. He has been on Xolair since 2019, however, he has noticed diminished response. Switching to Dupixent at this time.   Respiratory Medications Current regimen: Breztri 160-9-4.8 mcg/act Tried in past: Xolair, Symbicort, Trelegy Ellipta Patient reports {Adherence challenges yes no:[id number]::"adherence challenges","no known adherence challenges"}  OBJECTIVE Allergies  Allergen Reactions   Food Anaphylaxis and Shortness Of Breath    TREE NUTS   Iodinated Contrast Media Hives and Shortness Of Breath    Patient states hives to throat closing. (01/15/17: patient states this reaction was "about 20 years ago" with possibly an IVP.  He now says high doses of prednisone "throw me into AFib."  He has tolerated CT arthrograms with Benadrly 50mg  PO one hour before injection.  Clarence Sievert, RN)   Shellfish Allergy Anaphylaxis and Shortness Of Breath    To shellfish, crabs.  Makes him feel like "things are crawling all over" me.  Denies airway issues with these foods.  Clarence Sievert, RN 01/15/17)   Goat-Derived Products Hives    GOAT CHEESE    Prednisone Palpitations    PRECIPITATES A-FIB   Metformin And Related Diarrhea    High doses at once   Vancomycin Anxiety    Red man syndrome   Voltaren [Diclofenac Sodium] Other (See Comments)    Feels like things are crawling on him    Outpatient Encounter Medications as of 11/05/2022  Medication Sig   acetaminophen (TYLENOL) 500 MG tablet Take 1,000 mg by mouth 2 (two) times daily.   albuterol (PROVENTIL HFA;VENTOLIN HFA) 108 (90 Base) MCG/ACT inhaler Inhale 2 puffs into the lungs every 4 (four) hours as needed for shortness of breath (only if you can't catch your breath/ asthma).   atorvastatin (LIPITOR) 20 MG tablet Take 20 mg by mouth at bedtime.    bisoprolol (ZEBETA) 5 MG tablet TAKE 1 AND 1/2 TABLET BY MOUTH TWICE DAILY.   BREZTRI AEROSPHERE 160-9-4.8 MCG/ACT AERO INHALE 2 PUFFS INTO THE LUNGS (IN THE MORNING AND AT BEDTIME).   Carboxymethylcellul-Glycerin (LUBRICATING EYE DROPS OP) Place 1 drop into the right eye daily as needed (dry eyes).   CARTIA XT 120 MG 24 hr capsule TAKE (1) CAPSULE BY MOUTH DAILY, MAY TAKE A EXTRA CAPSULE DAILY FOR BREAKTHROUGH AFIB. (Patient taking differently: Take 120 mg by mouth daily.)   diphenhydramine-acetaminophen (TYLENOL PM) 25-500 MG TABS tablet Take 1 tablet by mouth at bedtime as needed (sleep/pain).   Dupilumab (DUPIXENT) 300 MG/2ML SOPN Inject 600mg  into the skin at Week 0, then 300mg  into the skin every 14 days thereafter.   EPINEPHrine 0.3 mg/0.3 mL IJ SOAJ injection Inject 0.3 mg into the muscle as needed for anaphylaxis.   flecainide (TAMBOCOR) 150 MG tablet Take 1/2 tablet by mouth 2 times per day. Please keep upcoming appointment for future refills. Thank you.   furosemide (LASIX) 40 MG tablet TAKE 1 TABLET BY MOUTH DAILY   glipiZIDE (GLUCOTROL XL) 5 MG 24 hr tablet Take 5 mg by mouth daily before breakfast.   hydrocortisone cream 0.5 % Apply 1 Application topically daily as needed for itching.   JARDIANCE 25 MG TABS tablet Take 25 mg by mouth daily before breakfast.   levothyroxine (SYNTHROID) 150 MCG tablet Take 150 mcg by mouth daily before breakfast.   losartan (COZAAR) 50 MG tablet Take 50 mg by mouth at bedtime.  metFORMIN (GLUCOPHAGE-XR) 500 MG 24 hr tablet Take 500 mg by mouth in the morning and at bedtime.   Multiple Vitamin (MULTIVITAMIN) tablet Take 1 tablet by mouth daily.   omeprazole (PRILOSEC OTC) 20 MG tablet Take 20 mg by mouth every morning.   Respiratory Therapy Supplies (FLUTTER) DEVI Use as directed   rivaroxaban (XARELTO) 20 MG TABS tablet Take 20 mg by mouth at bedtime.   No facility-administered encounter medications on file as of 11/05/2022.     Immunization  History  Administered Date(s) Administered   Fluad Quad(high Dose 65+) 09/12/2019   Influenza Split 11/14/2015, 10/11/2018   Influenza, High Dose Seasonal PF 09/28/2017   Influenza-Unspecified 11/02/2016, 09/03/2020, 10/03/2021   Moderna SARS-COV2 Booster Vaccination 08/20/2020, 03/04/2021   Moderna Sars-Covid-2 Vaccination 02/15/2020, 03/14/2020   Pneumococcal Conjugate-13 09/28/2015   Tdap 11/28/2019     PFTs     No data to display           Eosinophils Most recent blood eosinophil count was 100 cells/microL taken on 09/25/2019.   IgE: 70 on 03/12/2016   Assessment   Biologics training for dupilumab (Dupixent)  Goals of therapy: Mechanism: human monoclonal IgG4 antibody that inhibits interleukin-4 and interleukin-13 cytokine-induced responses, including release of proinflammatory cytokines, chemokines, and IgE Reviewed that Dupixent is add-on medication and patient must continue maintenance inhaler regimen. Response to therapy: may take 4 months to determine efficacy. Discussed that patients generally feel improvement sooner than 4 months.  Side effects: injection site reaction (6-18%), antibody development (5-16%), ophthalmic conjunctivitis (2-16%), transient blood eosinophilia (1-2%)  Dose: 600mg  at Week 0 (administered today in clinic) followed by 300mg  every 14 days thereafter  Administration/Storage:  Reviewed administration sites of thigh or abdomen (at least 2-3 inches away from abdomen). Reviewed the upper arm is only appropriate if caregiver is administering injection  Do not shake pen/syringe as this could lead to product foaming or precipitation. Do not use if solution is discolored or contains particulate matter or if window on prefilled pen is yellow (indicates pen has been used).  Reviewed storage of medication in refrigerator. Reviewed that Dupixent can be stored at room temperature in unopened carton for up to 14 days.  Access: Approval of Dupixent  through: insurance  Patient self-administered Dupixent 300mg /29ml x 2 (total dose 600mg ) in {injsitedsg:28167} and {injsitedsg:28167} using sample Dupixent 300mg /1mL autoinjector pen NDC: *** Lot: *** Expiration: ***  Patient monitored for 30 minutes for adverse reaction.  Patient tolerated ***. Injection site checked and no ***.  Medication Reconciliation  A drug regimen assessment was performed, including review of allergies, interactions, disease-state management, dosing and immunization history. Medications were reviewed with the patient, including name, instructions, indication, goals of therapy, potential side effects, importance of adherence, and safe use.  Drug interaction(s): none  Immunizations  Patient is indicated for the influenzae, pneumonia, and shingles vaccinations. Patient has received 4 COVID19 vaccines.   PLAN Continue Dupixent 300 mg every 14 days.  Next dose is due 11/22 and every 14 days thereafter. Rx sent to: [facility]: [phone] .  Patient provided with pharmacy phone number and advised to call later this week to schedule shipment to home.  Continue maintenance asthma regimen of: ***  All questions encouraged and answered.  Instructed patient to reach out with any further questions or concerns.  Thank you for allowing pharmacy to participate in this patient's care.  This appointment required *** minutes of patient care (this includes precharting, chart review, review of results,  face-to-face care, etc.).  Irish Elders, PharmD PGY-1 [facility] Pharmacy Resident

## 2022-11-05 ENCOUNTER — Other Ambulatory Visit (HOSPITAL_COMMUNITY): Payer: Self-pay

## 2022-11-05 ENCOUNTER — Ambulatory Visit: Payer: Medicare Other | Admitting: Pharmacist

## 2022-11-05 DIAGNOSIS — J4550 Severe persistent asthma, uncomplicated: Secondary | ICD-10-CM

## 2022-11-05 DIAGNOSIS — Z7189 Other specified counseling: Secondary | ICD-10-CM

## 2022-11-05 MED ORDER — DUPIXENT 300 MG/2ML ~~LOC~~ SOAJ
300.0000 mg | SUBCUTANEOUS | 1 refills | Status: DC
  Filled 2022-11-05: qty 12, 84d supply, fill #0
  Filled 2022-11-24 – 2022-12-05 (×2): qty 4, 28d supply, fill #0
  Filled 2023-01-05: qty 4, 28d supply, fill #1
  Filled 2023-02-02: qty 4, 28d supply, fill #2
  Filled 2023-03-03: qty 4, 28d supply, fill #3
  Filled 2023-03-31: qty 4, 28d supply, fill #4
  Filled 2023-04-27: qty 4, 28d supply, fill #5

## 2022-11-05 NOTE — Patient Instructions (Addendum)
Your next [place] dose is due on 11/19/22, 12/03/22, and every 14 days thereafter  CONTINUE Breztri 160-9-4.8 mcg (2 puffs twice daily)  Your prescription will be shipped from [facility]. Their phone number is [phone] Please call to schedule shipment and confirm address. They will mail your medication to your home.  You will need to be seen by your provider in 3 to 4 months to assess how [place] is working for you. Please ensure you have a follow-up appointment scheduled in [place] or MARCH 2024. Call our clinic if you need to make this appointment.  How to manage an injection site reaction: Remember the 5 C's: COUNTER - leave on the counter at least 30 minutes but up to overnight to bring medication to room temperature. This may help prevent stinging COLD - place something cold (like an ice gel pack or cold water bottle) on the injection site just before cleansing with alcohol. This may help reduce pain CLARITIN - use Claritin (generic name is loratadine) for the first two weeks of treatment or the day of, the day before, and the day after injecting. This will help to minimize injection site reactions CORTISONE CREAM - apply if injection site is irritated and itching CALL ME - if injection site reaction is bigger than the size of your fist, looks infected, blisters, or if you develop hives

## 2022-11-05 NOTE — Progress Notes (Addendum)
HPI Patient presents today to [facility] to see pharmacy team for [place] new start.  Past medical history includes asthma/COPD overlap, HTN, OSA, Afib, GERD, hypothyroidism, T2DM, HLD and arthritis. He has been on Xolair since 2019, however, he has noticed diminished response. Switching to Dupixent at this time. Reports allergies since childhood.  Last dose of Xolair was 2 months ago.   Does not have history of elevated eosinophils (at least >150) and is not steroid dependent (oral steroids cause afib recurrence).   Respiratory Medications Current regimen: Breztri 160-9-4.8 mcg/act Tried in past: Xolair, Symbicort, Trelegy Ellipta Patient reports no known adherence challenges  OBJECTIVE Allergies  Allergen Reactions   Food Anaphylaxis and Shortness Of Breath    TREE NUTS   Iodinated Contrast Media Hives and Shortness Of Breath    Patient states hives to throat closing. (01/15/17: patient states this reaction was "about 20 years ago" with possibly an IVP.  He now says high doses of prednisone "throw me into AFib."  He has tolerated CT arthrograms with Benadrly 50mg  PO one hour before injection.  Brita Romp, RN)   Shellfish Allergy Anaphylaxis and Shortness Of Breath    To shellfish, crabs.  Makes him feel like "things are crawling all over" me.  Denies airway issues with these foods.  Brita Romp, RN 01/15/17)   Goat-Derived Products Hives    GOAT CHEESE    Prednisone Palpitations    PRECIPITATES A-FIB   Metformin And Related Diarrhea    High doses at once   Vancomycin Anxiety    Red man syndrome   Voltaren [Diclofenac Sodium] Other (See Comments)    Feels like things are crawling on him    Outpatient Encounter Medications as of 11/05/2022  Medication Sig   acetaminophen (TYLENOL) 500 MG tablet Take 1,000 mg by mouth 2 (two) times daily.   albuterol (PROVENTIL HFA;VENTOLIN HFA) 108 (90 Base) MCG/ACT inhaler Inhale 2 puffs into the lungs every 4 (four) hours as needed  for shortness of breath (only if you can't catch your breath/ asthma).   atorvastatin (LIPITOR) 20 MG tablet Take 20 mg by mouth at bedtime.   bisoprolol (ZEBETA) 5 MG tablet TAKE 1 AND 1/2 TABLET BY MOUTH TWICE DAILY.   BREZTRI AEROSPHERE 160-9-4.8 MCG/ACT AERO INHALE 2 PUFFS INTO THE LUNGS (IN THE MORNING AND AT BEDTIME).   Carboxymethylcellul-Glycerin (LUBRICATING EYE DROPS OP) Place 1 drop into the right eye daily as needed (dry eyes).   CARTIA XT 120 MG 24 hr capsule TAKE (1) CAPSULE BY MOUTH DAILY, MAY TAKE A EXTRA CAPSULE DAILY FOR BREAKTHROUGH AFIB. (Patient taking differently: Take 120 mg by mouth daily.)   diphenhydramine-acetaminophen (TYLENOL PM) 25-500 MG TABS tablet Take 1 tablet by mouth at bedtime as needed (sleep/pain).   Dupilumab (DUPIXENT) 300 MG/2ML SOPN Inject 600mg  into the skin at Week 0, then 300mg  into the skin every 14 days thereafter.   EPINEPHrine 0.3 mg/0.3 mL IJ SOAJ injection Inject 0.3 mg into the muscle as needed for anaphylaxis.   flecainide (TAMBOCOR) 150 MG tablet Take 1/2 tablet by mouth 2 times per day. Please keep upcoming appointment for future refills. Thank you.   furosemide (LASIX) 40 MG tablet TAKE 1 TABLET BY MOUTH DAILY   glipiZIDE (GLUCOTROL XL) 5 MG 24 hr tablet Take 5 mg by mouth daily before breakfast.   hydrocortisone cream 0.5 % Apply 1 Application topically daily as needed for itching.   JARDIANCE 25 MG TABS tablet Take 25 mg by mouth daily  before breakfast.   levothyroxine (SYNTHROID) 150 MCG tablet Take 150 mcg by mouth daily before breakfast.   losartan (COZAAR) 50 MG tablet Take 50 mg by mouth at bedtime.   metFORMIN (GLUCOPHAGE-XR) 500 MG 24 hr tablet Take 500 mg by mouth in the morning and at bedtime.   Multiple Vitamin (MULTIVITAMIN) tablet Take 1 tablet by mouth daily.   omeprazole (PRILOSEC OTC) 20 MG tablet Take 20 mg by mouth every morning.   Respiratory Therapy Supplies (FLUTTER) DEVI Use as directed   rivaroxaban (XARELTO) 20 MG  TABS tablet Take 20 mg by mouth at bedtime.   No facility-administered encounter medications on file as of 11/05/2022.     Immunization History  Administered Date(s) Administered   Fluad Quad(high Dose 65+) 09/12/2019   Influenza Split 11/14/2015, 10/11/2018   Influenza, High Dose Seasonal PF 09/28/2017   Influenza-Unspecified 11/02/2016, 09/03/2020, 10/03/2021   Moderna SARS-COV2 Booster Vaccination 08/20/2020, 03/04/2021   Moderna Sars-Covid-2 Vaccination 02/15/2020, 03/14/2020   Pneumococcal Conjugate-13 09/28/2015   Tdap 11/28/2019     PFTs     No data to display          Eosinophils Most recent blood eosinophil count was 100 cells/microL taken on 09/25/2019.   IgE: 70 on 03/12/2016   Assessment   Biologics training for dupilumab (Dupixent)  Goals of therapy: Mechanism: human monoclonal IgG4 antibody that inhibits interleukin-4 and interleukin-13 cytokine-induced responses, including release of proinflammatory cytokines, chemokines, and IgE Reviewed that Dupixent is add-on medication and patient must continue maintenance inhaler regimen. Response to therapy: may take 4 months to determine efficacy. Discussed that patients generally feel improvement sooner than 4 months.  Side effects: injection site reaction (6-18%), antibody development (5-16%), ophthalmic conjunctivitis (2-16%), transient blood eosinophilia (1-2%)  Dose: 600mg  at Week 0 (administered today in clinic) followed by 300mg  every 14 days thereafter  Administration/Storage:  Reviewed administration sites of thigh or abdomen (at least 2-3 inches away from abdomen). Reviewed the upper arm is only appropriate if caregiver is administering injection  Do not shake pen/syringe as this could lead to product foaming or precipitation. Do not use if solution is discolored or contains particulate matter or if window on prefilled pen is yellow (indicates pen has been used).  Reviewed storage of medication in  refrigerator. Reviewed that [place] can be stored at room temperature in unopened carton for up to 14 days.  Access: Approval of Dupixent through: insurance  Patient self-administered Dupixent 300mg /49ml x 2 (total dose 600mg ) in right lower abdomen and left lower abdomen using [facility]-supplied medication: Dupixent 300mg /44mL autoinjector pen NDC: [phone] Lot: [id number] Expiration: 07/28/2024  Patient monitored for 30 minutes for adverse reaction.  Patient tolerated without issue. Injection site checked and no redness, itchiness, swelling noted by patient or provider.  Medication Reconciliation  A drug regimen assessment was performed, including review of allergies, interactions, disease-state management, dosing and immunization history. Medications were reviewed with the patient, including name, instructions, indication, goals of therapy, potential side effects, importance of adherence, and safe use.  Drug interaction(s): none  Immunizations  Patient has received 4 COVID19 vaccines. Eligible for shingles vaccine  PLAN Continue Dupixent 300 mg every 14 days.  Next dose is due 11/19/22 and every 14 days thereafter. Rx sent to: [facility]: [phone] .  Patient provided with pharmacy phone number and advised that they will call to schedule shipment Continue maintenance asthma regimen of:  Breztri 160-9-4.8 mcg (2 puffs twice daily)  All questions encouraged and answered.  Instructed patient to reach out with any further questions or concerns.  Thank you for allowing pharmacy to participate in this patient's care.  This appointment required 60 minutes of patient care (this includes precharting, chart review, review of results, face-to-face care, etc.).  Knox Saliva, PharmD, MPH, BCPS, CPP Clinical Pharmacist (Rheumatology and Pulmonology)

## 2022-11-05 NOTE — Progress Notes (Deleted)
HPI Patient presents today to [facility] to see pharmacy team for [place] new start.  Past medical history includes asthma/COPD overlap, HTN, OSA, Afib, GERD, hypothyroidism, T2DM, HLD and arthritis. He has been on Xolair since 2019, however, he has noticed diminished response. Switching to Dupixent at this time.   Respiratory Medications Current regimen: Breztri 160-9-4.8 mcg/act Tried in past: Xolair, Symbicort, Trelegy Ellipta Patient reports no known adherence challenges  OBJECTIVE Allergies  Allergen Reactions   Food Anaphylaxis and Shortness Of Breath    TREE NUTS   Iodinated Contrast Media Hives and Shortness Of Breath    Patient states hives to throat closing. (01/15/17: patient states this reaction was "about 20 years ago" with possibly an IVP.  He now says high doses of prednisone "throw me into AFib."  He has tolerated CT arthrograms with Benadrly 50mg  PO one hour before injection.  Brita Romp, RN)   Shellfish Allergy Anaphylaxis and Shortness Of Breath    To shellfish, crabs.  Makes him feel like "things are crawling all over" me.  Denies airway issues with these foods.  Brita Romp, RN 01/15/17)   Goat-Derived Products Hives    GOAT CHEESE    Prednisone Palpitations    PRECIPITATES A-FIB   Metformin And Related Diarrhea    High doses at once   Vancomycin Anxiety    Red man syndrome   Voltaren [Diclofenac Sodium] Other (See Comments)    Feels like things are crawling on him    Outpatient Encounter Medications as of 11/05/2022  Medication Sig   acetaminophen (TYLENOL) 500 MG tablet Take 1,000 mg by mouth 2 (two) times daily.   albuterol (PROVENTIL HFA;VENTOLIN HFA) 108 (90 Base) MCG/ACT inhaler Inhale 2 puffs into the lungs every 4 (four) hours as needed for shortness of breath (only if you can't catch your breath/ asthma).   atorvastatin (LIPITOR) 20 MG tablet Take 20 mg by mouth at bedtime.   bisoprolol (ZEBETA) 5 MG tablet TAKE 1 AND 1/2 TABLET BY MOUTH  TWICE DAILY.   BREZTRI AEROSPHERE 160-9-4.8 MCG/ACT AERO INHALE 2 PUFFS INTO THE LUNGS (IN THE MORNING AND AT BEDTIME).   Carboxymethylcellul-Glycerin (LUBRICATING EYE DROPS OP) Place 1 drop into the right eye daily as needed (dry eyes).   CARTIA XT 120 MG 24 hr capsule TAKE (1) CAPSULE BY MOUTH DAILY, MAY TAKE A EXTRA CAPSULE DAILY FOR BREAKTHROUGH AFIB. (Patient taking differently: Take 120 mg by mouth daily.)   diphenhydramine-acetaminophen (TYLENOL PM) 25-500 MG TABS tablet Take 1 tablet by mouth at bedtime as needed (sleep/pain).   Dupilumab (DUPIXENT) 300 MG/2ML SOPN Inject 600mg  into the skin at Week 0, then 300mg  into the skin every 14 days thereafter.   EPINEPHrine 0.3 mg/0.3 mL IJ SOAJ injection Inject 0.3 mg into the muscle as needed for anaphylaxis.   flecainide (TAMBOCOR) 150 MG tablet Take 1/2 tablet by mouth 2 times per day. Please keep upcoming appointment for future refills. Thank you.   furosemide (LASIX) 40 MG tablet TAKE 1 TABLET BY MOUTH DAILY   glipiZIDE (GLUCOTROL XL) 5 MG 24 hr tablet Take 5 mg by mouth daily before breakfast.   hydrocortisone cream 0.5 % Apply 1 Application topically daily as needed for itching.   JARDIANCE 25 MG TABS tablet Take 25 mg by mouth daily before breakfast.   levothyroxine (SYNTHROID) 150 MCG tablet Take 150 mcg by mouth daily before breakfast.   losartan (COZAAR) 50 MG tablet Take 50 mg by mouth at bedtime.   metFORMIN (GLUCOPHAGE-XR)  500 MG 24 hr tablet Take 500 mg by mouth in the morning and at bedtime.   Multiple Vitamin (MULTIVITAMIN) tablet Take 1 tablet by mouth daily.   omeprazole (PRILOSEC OTC) 20 MG tablet Take 20 mg by mouth every morning.   Respiratory Therapy Supplies (FLUTTER) DEVI Use as directed   rivaroxaban (XARELTO) 20 MG TABS tablet Take 20 mg by mouth at bedtime.   No facility-administered encounter medications on file as of 11/05/2022.     Immunization History  Administered Date(s) Administered   Fluad Quad(high Dose  65+) 09/12/2019   Influenza Split 11/14/2015, 10/11/2018   Influenza, High Dose Seasonal PF 09/28/2017   Influenza-Unspecified 11/02/2016, 09/03/2020, 10/03/2021   Moderna SARS-COV2 Booster Vaccination 08/20/2020, 03/04/2021   Moderna Sars-Covid-2 Vaccination 02/15/2020, 03/14/2020   Pneumococcal Conjugate-13 09/28/2015   Tdap 11/28/2019     PFTs     No data to display            Eosinophils Most recent blood eosinophil count was 100 cells/microL taken on 09/25/2019.   IgE: 70 on 03/12/2016   Assessment   Biologics training for dupilumab (Dupixent)  Goals of therapy: Mechanism: human monoclonal IgG4 antibody that inhibits interleukin-4 and interleukin-13 cytokine-induced responses, including release of proinflammatory cytokines, chemokines, and IgE Reviewed that Dupixent is add-on medication and patient must continue maintenance inhaler regimen. Response to therapy: may take 4 months to determine efficacy. Discussed that patients generally feel improvement sooner than 4 months.  Side effects: injection site reaction (6-18%), antibody development (5-16%), ophthalmic conjunctivitis (2-16%), transient blood eosinophilia (1-2%)  Dose: 600mg  at Week 0 (administered today in clinic) followed by 300mg  every 14 days thereafter  Administration/Storage:  Reviewed administration sites of thigh or abdomen (at least 2-3 inches away from abdomen). Reviewed the upper arm is only appropriate if caregiver is administering injection  Do not shake pen/syringe as this could lead to product foaming or precipitation. Do not use if solution is discolored or contains particulate matter or if window on prefilled pen is yellow (indicates pen has been used).  Reviewed storage of medication in refrigerator. Reviewed that [place] can be stored at room temperature in unopened carton for up to 14 days.  Access: Approval of Dupixent through: insurance  Patient self-administered Dupixent 300mg /65ml x 2  (total dose 600mg ) in right lower abdomen and left lower abdomen using sample Dupixent 300mg /81mL autoinjector pen NDC: [phone] Lot: [id number] Expiration: 07/28/2024  Patient monitored for 30 minutes for adverse reaction.  Patient tolerated ***. Injection site checked and no ***. Medication Reconciliation  A drug regimen assessment was performed, including review of allergies, interactions, disease-state management, dosing and immunization history. Medications were reviewed with the patient, including name, instructions, indication, goals of therapy, potential side effects, importance of adherence, and safe use.  Drug interaction(s): none  Immunizations  Patient is indicated for the influenzae, pneumonia, and shingles vaccinations. Patient has received 4 COVID19 vaccines.   PLAN Continue Dupixent 300 mg every 14 days.  Next dose is due 11/19/22 and every 14 days thereafter. Rx sent to: [facility]: [phone] .  Patient provided with pharmacy phone number and advised to call later this week to schedule shipment to home.  Continue maintenance asthma regimen of:  ***  All questions encouraged and answered.  Instructed patient to reach out with any further questions or concerns.  Thank you for allowing pharmacy to participate in this patient's care.  This appointment required 60 minutes of patient care (this includes precharting, chart review,  review of results, face-to-face care, etc.).  Maryan Puls, PharmD PGY-1 [facility] Resident

## 2022-11-14 ENCOUNTER — Encounter: Payer: Self-pay | Admitting: Pulmonary Disease

## 2022-11-14 ENCOUNTER — Ambulatory Visit (INDEPENDENT_AMBULATORY_CARE_PROVIDER_SITE_OTHER): Payer: Medicare Other

## 2022-11-14 ENCOUNTER — Encounter: Payer: Self-pay | Admitting: Internal Medicine

## 2022-11-14 ENCOUNTER — Other Ambulatory Visit (HOSPITAL_COMMUNITY): Payer: Self-pay | Admitting: Physician Assistant

## 2022-11-14 DIAGNOSIS — I495 Sick sinus syndrome: Secondary | ICD-10-CM | POA: Diagnosis not present

## 2022-11-14 MED ORDER — AMOXICILLIN-POT CLAVULANATE 875-125 MG PO TABS: 1.0000 | ORAL_TABLET | Freq: Two times a day (BID) | ORAL | 0 refills | Status: DC

## 2022-11-14 NOTE — Telephone Encounter (Signed)
I suspect he has an acute bronchitis.  I do not think this is related to the [place].  A small local reaction to Dupixent is not unusual.  We will send in antibiotic.  Recommend Augmentin 875, 1 tablet twice a day for 7 days, so recommend that he use Mucinex DM Nexium strength 1 tablet twice a day.  Also continue using his vest.  If symptoms worsen I recommend he go to the ER.

## 2022-11-14 NOTE — Telephone Encounter (Signed)
Dr. Gonzalez, please advise. Thanks 

## 2022-11-14 NOTE — Telephone Encounter (Signed)
Dr. Patsey Berthold, please advise. Covid test neg.

## 2022-11-16 LAB — CUP PACEART REMOTE DEVICE CHECK
Battery Remaining Longevity: 110 mo
Battery Remaining Percentage: 95.5 %
Battery Voltage: 3.01 V
Brady Statistic AP VP Percent: 1 %
Brady Statistic AP VS Percent: 95 %
Brady Statistic AS VP Percent: 1 %
Brady Statistic AS VS Percent: 4.4 %
Brady Statistic RA Percent Paced: 95 %
Brady Statistic RV Percent Paced: 1 %
Date Time Interrogation Session: 20231117110303
Implantable Lead Connection Status: 753985
Implantable Lead Connection Status: 753985
Implantable Lead Implant Date: 20051106
Implantable Lead Implant Date: 20051106
Implantable Lead Location: 753859
Implantable Lead Location: 753860
Implantable Pulse Generator Implant Date: 20230710
Lead Channel Impedance Value: 350 Ohm
Lead Channel Impedance Value: 700 Ohm
Lead Channel Pacing Threshold Amplitude: 0.5 V
Lead Channel Pacing Threshold Amplitude: 0.75 V
Lead Channel Pacing Threshold Pulse Width: 0.5 ms
Lead Channel Pacing Threshold Pulse Width: 0.5 ms
Lead Channel Sensing Intrinsic Amplitude: 12 mV
Lead Channel Sensing Intrinsic Amplitude: 2.7 mV
Lead Channel Setting Pacing Amplitude: 2 V
Lead Channel Setting Pacing Amplitude: 2.5 V
Lead Channel Setting Pacing Pulse Width: 0.5 ms
Lead Channel Setting Sensing Sensitivity: 2 mV
Pulse Gen Model: 2272
Pulse Gen Serial Number: 8099041

## 2022-11-24 ENCOUNTER — Other Ambulatory Visit (HOSPITAL_COMMUNITY): Payer: Self-pay

## 2022-11-25 ENCOUNTER — Encounter: Payer: Self-pay | Admitting: Pulmonary Disease

## 2022-11-25 ENCOUNTER — Ambulatory Visit: Payer: Medicare Other | Admitting: Pulmonary Disease

## 2022-11-25 VITALS — BP 128/80 | HR 60 | Temp 98.1°F | Ht 69.0 in | Wt 229.8 lb

## 2022-11-25 DIAGNOSIS — J4489 Other specified chronic obstructive pulmonary disease: Secondary | ICD-10-CM | POA: Diagnosis not present

## 2022-11-25 DIAGNOSIS — J9611 Chronic respiratory failure with hypoxia: Secondary | ICD-10-CM | POA: Diagnosis not present

## 2022-11-25 DIAGNOSIS — J479 Bronchiectasis, uncomplicated: Secondary | ICD-10-CM

## 2022-11-25 DIAGNOSIS — J4550 Severe persistent asthma, uncomplicated: Secondary | ICD-10-CM | POA: Diagnosis not present

## 2022-11-25 DIAGNOSIS — G4733 Obstructive sleep apnea (adult) (pediatric): Secondary | ICD-10-CM

## 2022-11-25 LAB — NITRIC OXIDE: Nitric Oxide: 31

## 2022-11-25 NOTE — Patient Instructions (Addendum)
Your level of inflammation today was relatively low considering.  This means that the Tang[place] is doing what it needs to do.  We will continue to check this on follow-up appointments.  You may use plain Mucinex (over-the-counter) as needed when secretions are thick.  Continue your current medications.  We will see you back in 3 months or sooner should any new problems arise.

## 2022-11-25 NOTE — Progress Notes (Signed)
Subjective:    Patient ID: Clarence Dawson, male    DOB: [birth year], 76 y.o.   MRN: [id number] Chief Complaint  Patient presents with   Follow-up    Cough with clear to brownish sputum. Occasional SOB.    Chief Complaint  Patient presents with   Follow-up    Cough with clear to brownish sputum. Occasional SOB.     HPI Clarence Dawson Stable is a 76 year old former smoker (76 PY) who presents for follow-up of asthma with COPD overlap, bronchiectasis and chronic respiratory failure with hypoxia.  He was last seen on 25 September 2022.  At that time he noted that the Xolair was not working as previous and he was switched to Dupixent.  He was able to finally start Dupixent on 05 November 2022 after multiple appeals had to be filed.  It is still too early to tell if this will have more of an impact on his asthma control.  The patient is compliant with Breztri.  He is compliant with vest physiotherapy and supplemental oxygen.Marland Kitchen  His only complaint is that of increased congestion in the mornings which usually clears after he drinks a second cup of coffee in the morning. He notes that he is on CPAP for sleep apnea and sometimes the humidifier reservoir is empty in the mornings.  This is managed by his primary care physician.  He does not endorse any other symptomatology.  No fevers, chills or sweats.  Cough is productive of yellowish sputum at times copious, does expectorate quite a bit after vest physiotherapy.   Review of Systems A 10 point review of systems was performed and it is as noted above otherwise negative.  Patient Active Problem List   Diagnosis Date Noted   Secondary hypercoagulable state (HCC) 01/02/2020   Chronic diastolic heart failure (HCC) 11/22/2019   Paroxysmal atrial fibrillation (HCC) 09/22/2019   Atrial fibrillation with RVR (HCC)    Lobar pneumonia (HCC) 09/21/2019   Acute respiratory failure with hypoxia (HCC) 09/21/2019   Acute on chronic respiratory failure (HCC) 09/18/2019   Severe  sepsis (HCC)    Elevated lactic acid level    GERD (gastroesophageal reflux disease)    Chronic bronchitis (HCC) 02/09/2019   CAP (community acquired pneumonia) 03/03/2018   Primary localized osteoarthritis of right knee 08/11/2017   Asthma with acute exacerbation 12/08/2016   Chest pain 10/20/2016   HTN (hypertension) 10/20/2016   Morbid obesity due to excess calories (HCC) 06/24/2016   Severe persistent asthma 02/12/2016   Upper airway cough syndrome 01/24/2016   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   Persistent atrial fibrillation    Syncope    Hyperthyroidism    Hyperlipidemia    RBBB (right bundle branch block)    Tricuspid valve disorder    Aortic valve disorder    Carotid artery stenosis    Type 2 diabetes mellitus (HCC)    Pacemaker    OSA (obstructive sleep apnea)    Arthritis    Sinoatrial node dysfunction (HCC)    PVC's (premature ventricular contractions) 07/17/2011   Essential hypertension, benign 01/24/2011   Premature ventricular contractions 01/24/2011   PPM-[facility] 01/24/2011   Social History   Tobacco Use   Smoking status: Former    Packs/day: 3.00    Years: 25.00    Total pack years: 75.00    Types: Cigarettes    Start date: 02/20/1958    Quit date: 02/12/1984    Years since quitting: 38.8   Smokeless tobacco: Former    Types:  Chew   Tobacco comments:    Former smoker 08/30/2021  Substance Use Topics   Alcohol use: Yes    Alcohol/week: 2.0 - 4.0 standard drinks of alcohol    Types: 1 - 2 Cans of beer, 1 - 2 Glasses of wine per week   Allergies  Allergen Reactions   Food Anaphylaxis and Shortness Of Breath    TREE NUTS   Iodinated Contrast Media Hives and Shortness Of Breath    Patient states hives to throat closing. (01/15/17: patient states this reaction was "about 20 years ago" with possibly an IVP.  He now says high doses of prednisone "throw me into AFib."  He has tolerated CT arthrograms with Benadrly 50mg  PO one hour before injection.  Donell Sievert,  RN)   Shellfish Allergy Anaphylaxis and Shortness Of Breath    To shellfish, crabs.  Makes him feel like "things are crawling all over" me.  Denies airway issues with these foods.  Donell Sievert, RN 01/15/17)   Goat-Derived Products Hives    GOAT CHEESE    Prednisone Palpitations    PRECIPITATES A-FIB   Metformin And Related Diarrhea    High doses at once   Vancomycin Anxiety    Red man syndrome   Voltaren [Diclofenac Sodium] Other (See Comments)    Feels like things are crawling on him   Current Meds  Medication Sig   acetaminophen (TYLENOL) 500 MG tablet Take 1,000 mg by mouth 2 (two) times daily.   albuterol (PROVENTIL HFA;VENTOLIN HFA) 108 (90 Base) MCG/ACT inhaler Inhale 2 puffs into the lungs every 4 (four) hours as needed for shortness of breath (only if you can't catch your breath/ asthma).   atorvastatin (LIPITOR) 20 MG tablet Take 20 mg by mouth at bedtime.   bisoprolol (ZEBETA) 5 MG tablet TAKE 1 AND 1/2 TABLET BY MOUTH TWICE DAILY.   BREZTRI AEROSPHERE 160-9-4.8 MCG/ACT AERO INHALE 2 PUFFS INTO THE LUNGS (IN THE MORNING AND AT BEDTIME).   Carboxymethylcellul-Glycerin (LUBRICATING EYE DROPS OP) Place 1 drop into the right eye daily as needed (dry eyes).   CARTIA XT 120 MG 24 hr capsule TAKE (1) CAPSULE BY MOUTH DAILY, MAY TAKE A EXTRA CAPSULE DAILY FOR BREAKTHROUGH AFIB.   diphenhydramine-acetaminophen (TYLENOL PM) 25-500 MG TABS tablet Take 1 tablet by mouth at bedtime as needed (sleep/pain).   Dupilumab (DUPIXENT) 300 MG/2ML SOPN Inject 300 mg into the skin every 14 (fourteen) days.   EPINEPHrine 0.3 mg/0.3 mL IJ SOAJ injection Inject 0.3 mg into the muscle as needed for anaphylaxis.   flecainide (TAMBOCOR) 150 MG tablet Take 1/2 tablet by mouth 2 times per day. Please keep upcoming appointment for future refills. Thank you.   furosemide (LASIX) 40 MG tablet TAKE 1 TABLET BY MOUTH DAILY   glipiZIDE (GLUCOTROL XL) 5 MG 24 hr tablet Take 5 mg by mouth daily before breakfast.    hydrocortisone cream 0.5 % Apply 1 Application topically daily as needed for itching.   JARDIANCE 25 MG TABS tablet Take 25 mg by mouth daily before breakfast.   levothyroxine (SYNTHROID) 150 MCG tablet Take 150 mcg by mouth daily before breakfast.   losartan (COZAAR) 50 MG tablet Take 50 mg by mouth at bedtime.   metFORMIN (GLUCOPHAGE-XR) 500 MG 24 hr tablet Take 500 mg by mouth in the morning and at bedtime.   Multiple Vitamin (MULTIVITAMIN) tablet Take 1 tablet by mouth daily.   omeprazole (PRILOSEC OTC) 20 MG tablet Take 20 mg by mouth every morning.  Respiratory Therapy Supplies (FLUTTER) DEVI Use as directed   rivaroxaban (XARELTO) 20 MG TABS tablet Take 20 mg by mouth at bedtime.   Immunization History  Administered Date(s) Administered   Fluad Quad(high Dose 65+) 09/12/2019, 10/15/2022   Influenza Split 11/14/2015, 10/11/2018   Influenza, High Dose Seasonal PF 09/28/2017   Influenza-Unspecified 11/02/2016, 09/03/2020, 10/03/2021   Moderna SARS-COV2 Booster Vaccination 08/20/2020, 03/04/2021   Moderna Sars-Covid-2 Vaccination 02/15/2020, 03/14/2020   Pneumococcal Conjugate-13 09/28/2015   Tdap 11/28/2019       Objective:   Physical Exam BP 128/80 (BP Location: Left Arm, Cuff Size: Normal)   Pulse 60   Temp 98.1 F (36.7 C)   Ht 5\' 9"  (1.753 m)   Wt 229 lb 12.8 oz (104.2 kg)   SpO2 (!) 89%   BMI 33.94 kg/m   SpO2: 93 % O2 Device: Nasal cannula O2 Flow Rate (L/min): 3 L/min  GENERAL: Well-developed, obese patient in no acute distress.  He is comfortable on nasal cannula O2.  He is fully ambulatory. HEAD: Normocephalic, atraumatic.  EYES: Pupils equal, round, reactive to light.  No scleral icterus.  MOUTH: Nose/mouth/throat not examined due to masking requirements for COVID 19.   NECK: Supple. No thyromegaly. Trachea midline. No JVD.  No adenopathy. PULMONARY: Good air entry bilaterally.  Symmetrical, nonlabored, few end expiratory wheezes few  rhonchi. CARDIOVASCULAR: S1 and S2.  Regular rate and rhythm, NSR, rate 60.  Grade 2/6 mitral regurgitation murmur. Pacemaker on left. GASTROINTESTINAL: Obese otherwise benign. MUSCULOSKELETAL: No joint deformity, no clubbing, no edema.  NEUROLOGIC: No focal deficits, speech is fluent.  No gait disturbance noted.  Awake, alert and oriented. SKIN: Intact,warm,dry. PSYCH: Mood and behavior appropriate.  Lab Results  Component Value Date   NITRICOXIDE 31 11/25/2022       Assessment & Plan:     ICD-10-CM   1. Severe persistent asthma without complication  J45.50 Nitric oxide   He is to continue maintenance inhalers Continue Dupixent FeNO 30 today    2. Asthma-COPD overlap syndrome  J44.89    Continue Breztri 2 puffs twice a day Continue as needed albuterol    3. Bronchiectasis without complication (HCC)  J47.9    Continue vest physiotherapy    4. Chronic respiratory failure with hypoxia (HCC)  J96.11    Patient compliant with oxygen at 3 L/min Notes benefit of therapy Continue same    5. OSA (obstructive sleep apnea)  G47.33    Follows with primary care for this Currently on CPAP     Orders Placed This Encounter  Procedures   Nitric oxide   Will see the patient back in 3 months time he is to contact us prior to that time should any new difficulties arise.  Gailen Shelter, MD Advanced Bronchoscopy PCCM [facility]    *This note was dictated using voice recognition software/Dragon.  Despite best efforts to proofread, errors can occur which can change the meaning. Any transcriptional errors that result from this process are unintentional and may not be fully corrected at the time of dictation.

## 2022-12-01 NOTE — Progress Notes (Signed)
Remote pacemaker transmission.   

## 2022-12-05 ENCOUNTER — Other Ambulatory Visit (HOSPITAL_COMMUNITY): Payer: Self-pay

## 2022-12-08 ENCOUNTER — Other Ambulatory Visit: Payer: Self-pay

## 2022-12-09 ENCOUNTER — Other Ambulatory Visit: Payer: Self-pay

## 2022-12-11 ENCOUNTER — Other Ambulatory Visit: Payer: Self-pay | Admitting: Internal Medicine

## 2022-12-11 ENCOUNTER — Encounter: Payer: Self-pay | Admitting: Internal Medicine

## 2022-12-16 ENCOUNTER — Telehealth: Payer: Self-pay

## 2022-12-16 NOTE — Telephone Encounter (Signed)
Alert received from [employer]: [facility]lin alert for "long AT/AF episode". Presenting rhythm AF with overall controlled rates, onset approx 12/07/22. Hx of PAF. [place]- Xarelto.   Outreach made to Pt.  He has noticed that he has had increased SOB over the last week or a little longer.  He will increase his diltiazem to BID per previous recommendation.  He will send another remote transmission on 12/24/22 to see if he is still in afib.  If still in afib will schedule DCCV if Pt preference (per discussion with GT).  Pt in agreement with plan.  Will monitor for transmission 12/24/22.

## 2022-12-24 NOTE — Telephone Encounter (Signed)
Outreach made to Pt to advise he is still in atrial fibrillation.  Per Pt he was aware he was still in afib because he is short of breath.  He requests appt with afib clinic to explore options.  Pt scheduled with afib clinic 12/26/2022 at 8:30 am.  Pt aware to go to [facility] 6th floor.

## 2022-12-24 NOTE — Telephone Encounter (Signed)
Manual transmission received.  Pt continues to be in atrial fibrillation.

## 2022-12-26 ENCOUNTER — Encounter (HOSPITAL_COMMUNITY): Payer: Self-pay | Admitting: Physician Assistant

## 2022-12-26 ENCOUNTER — Ambulatory Visit (HOSPITAL_COMMUNITY)
Admission: RE | Admit: 2022-12-26 | Discharge: 2022-12-26 | Disposition: A | Discharge status: Home / Self Care | Payer: Medicare Other | Source: Ambulatory Visit | Attending: Physician Assistant | Admitting: Physician Assistant

## 2022-12-26 VITALS — BP 122/82 | HR 71 | Ht 69.0 in | Wt 235.0 lb

## 2022-12-26 DIAGNOSIS — I495 Sick sinus syndrome: Secondary | ICD-10-CM | POA: Diagnosis not present

## 2022-12-26 DIAGNOSIS — K219 Gastro-esophageal reflux disease without esophagitis: Secondary | ICD-10-CM | POA: Diagnosis not present

## 2022-12-26 DIAGNOSIS — I4819 Other persistent atrial fibrillation: Secondary | ICD-10-CM | POA: Diagnosis present

## 2022-12-26 DIAGNOSIS — J449 Chronic obstructive pulmonary disease, unspecified: Secondary | ICD-10-CM | POA: Insufficient documentation

## 2022-12-26 DIAGNOSIS — Z79899 Other long term (current) drug therapy: Secondary | ICD-10-CM | POA: Diagnosis not present

## 2022-12-26 DIAGNOSIS — Z7901 Long term (current) use of anticoagulants: Secondary | ICD-10-CM | POA: Diagnosis not present

## 2022-12-26 DIAGNOSIS — E118 Type 2 diabetes mellitus with unspecified complications: Secondary | ICD-10-CM | POA: Insufficient documentation

## 2022-12-26 DIAGNOSIS — D6869 Other thrombophilia: Secondary | ICD-10-CM | POA: Diagnosis not present

## 2022-12-26 DIAGNOSIS — I5032 Chronic diastolic (congestive) heart failure: Secondary | ICD-10-CM | POA: Diagnosis not present

## 2022-12-26 DIAGNOSIS — I110 Hypertensive heart disease with heart failure: Secondary | ICD-10-CM | POA: Insufficient documentation

## 2022-12-26 DIAGNOSIS — Z7984 Long term (current) use of oral hypoglycemic drugs: Secondary | ICD-10-CM | POA: Diagnosis not present

## 2022-12-26 NOTE — Progress Notes (Signed)
Primary Care Physician: [name], MD Primary Electrophysiologist: Dr Lovena Le Referring Physician: Dr Carlos Levering is a 76 y.o. male with a history of SSS s/p PPM, atrial fibrillation, chronic diastolic CHF, HTN, COPD, DM, and GERD who presents for follow up in the [facility]. Patient was recently admitted with elevated temp and AMS and was diagnosed with severe sepsis 2/2 pneumonia. During his admission, he developed afib with RVR. He was given a bolus of flecainide which converted him to atrial flutter. Patient was fairly asymptomatic. He is on Xarelto for a CHADS2VASC score of 3.  The device clinic received an alert for an ongoing afib episode starting 04/09/21. He did have two alcohol drinks the night before and was also being treated for an URI. COVID test was negative. He was back in SR before follow up.  Patient was noted to be out of rhythm by the device clinic on 08/23/21 and underwent DCCV on 09/05/21. He had recurrent afib and underwent another DCCV on 08/06/22.  On follow up today, the device clinic received an alert for an ongoing afib episode starting 12/07/22. Patient has noted increased SOB since that time. He did have a sinus infection around the time his afib started. He denies any bleeding issues on anticoagulation.   Today, he denies symptoms of palpitations, chest pain, orthopnea, PND, dizziness, presyncope, syncope, snoring, daytime somnolence, bleeding, or neurologic sequela. The patient is tolerating medications without difficulties and is otherwise without complaint today.    Atrial Fibrillation Risk Factors:  he does not have symptoms or diagnosis of sleep apnea. he does not have a history of rheumatic fever. he does not have a history of alcohol use. The patient does not have a history of early familial atrial fibrillation or other arrhythmias.  he has a BMI of Body mass index is 34.7 kg/m.Marland Kitchen Filed Weights   12/26/22 0818   Weight: 106.6 kg    Family History  Problem Relation Age of Onset   Liver cancer Mother    Pancreatic cancer Mother    Colon cancer Mother    Hypertension Father    Stroke Father    Other Father 89       Sudden Cardiac death   Heart attack Father    Cancer Sister        brain   Diabetes Sister    Colon cancer Maternal Aunt    Colon polyps Neg Hx      Atrial Fibrillation Management history:  Previous antiarrhythmic drugs: flecainide Previous cardioversions: 11/2016, 09/05/21 Previous ablations: none CHADS2VASC score: 4 Anticoagulation history: Xarelto   Past Medical History:  Diagnosis Date   Allergic rhinitis    Aortic valve disorder    Asthma    since childhood- seasonal allergies induced   Cancer ([place])    Skin cancer- squamous, basal   Carotid artery stenosis    Essential hypertension    Full dentures    GERD (gastroesophageal reflux disease)    H/O hiatal hernia    Hemorrhage of rectum    Hyperlipidemia    Hypothyroidism    Male circumcision    OSA (obstructive sleep apnea)    Osteoarthritis    Pacemaker    Oct 2005 in [place].   PAF (paroxysmal atrial fibrillation) ([place])    Pneumonia    "several Times" 2015 last time   Primary localized osteoarthritis of right knee 08/11/2017   RBBB (right bundle branch block)    Sinoatrial node  dysfunction ([place])    Syncope    Tricuspid valve disorder    Type 2 diabetes mellitus ([place])    Type II   Past Surgical History:  Procedure Laterality Date   A-V CARDIAC PACEMAKER INSERTION     Sick sinus syndrome DDR pacer   Arthropathy Right 2005   Rebuilding of left thumb and joint    CARDIAC CATHETERIZATION     CARDIAC ELECTROPHYSIOLOGY STUDY AND ABLATION  09/2008   for pvcs, Dr. Loralie Champagne   CARDIOVERSION N/A 12/18/2016   Procedure: CARDIOVERSION;  Surgeon: Pixie Casino, MD;  Location: [facility] ENDOSCOPY;  Service: Cardiovascular;  Laterality: N/A;   CARDIOVERSION N/A 09/05/2021   Procedure: CARDIOVERSION;  Surgeon:  Geralynn Rile, MD;  Location: [place];  Service: Cardiovascular;  Laterality: N/A;   CARDIOVERSION N/A 08/06/2022   Procedure: CARDIOVERSION;  Surgeon: Donato Heinz, MD;  Location: [facility] ENDOSCOPY;  Service: Cardiovascular;  Laterality: N/A;   [place]   right wrist   CARPAL TUNNEL RELEASE  05/04/2012   Procedure: CARPAL TUNNEL RELEASE;  Surgeon: Wynonia Sours, MD;  Location: [place];  Service: Orthopedics;  Laterality: Left;   CARPOMETACARPEL SUSPENSION PLASTY Right 11/16/2014   Procedure: SUSPENSIONPLASTY RIGHT THUMB TENDON TRANSFER ABDUCTOR POLLICUS LONGUS EXCISION TRAPEZIUM;  Surgeon: Daryll Brod, MD;  Location: [place];  Service: Orthopedics;  Laterality: Right;   CHOLECYSTECTOMY  1994   CIRCUMCISION     COLONOSCOPY N/A 03/14/2013   Procedure: COLONOSCOPY;  Surgeon: Daneil Dolin, MD;  [facility];  Service: Endoscopy;  Laterality: N/A;  8:15 AM   EYE SURGERY     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-[facility]   EYE SURGERY  2012   Left eye Corneal transplant- partial- Cataract   FLEXIBLE BRONCHOSCOPY Right 06/23/2019   Procedure: FLEXIBLE BRONCHOSCOPY RIGHT;  Surgeon: [name], MD;  Location: [facility] ORS;  Service: Pulmonary;  Laterality: Right;   GALLBLADDER SURGERY  12/01/2006   HAND TENDON SURGERY Left late 1990's   thumb   HEMORROIDECTOMY  2003   INJECTION KNEE Left 08/26/2017   Procedure: LEFT KNEE INJECTION;  Surgeon: Elsie Saas, MD;  Location: [place];  Service: Orthopedics;  Laterality: Left;   left Knee Arthroscopy     [facility]   PARTIAL KNEE ARTHROPLASTY  11/22/2012   Procedure: UNICOMPARTMENTAL KNEE;  Surgeon: Lorn Junes, MD;  Location: [place];  Service: Orthopedics;  Laterality: Left;  left unicompartmental knee arthroplasty   PERMANENT PACEMAKER GENERATOR CHANGE N/A 01/12/2013   Procedure: PERMANENT PACEMAKER GENERATOR CHANGE;  Surgeon: Evans Lance, MD;   Location: [facility] CATH LAB;  Service: Cardiovascular;  Laterality: N/A;   PPM GENERATOR CHANGEOUT N/A 07/07/2022   Procedure: PPM GENERATOR CHANGEOUT;  Surgeon: Evans Lance, MD;  Location: [place] CV LAB;  Service: Cardiovascular;  Laterality: N/A;   Rotator cuff Surgery  2001   Right shoulder   TONSILLECTOMY     TOTAL KNEE ARTHROPLASTY Right 08/26/2017   Procedure: TOTAL KNEE ARTHROPLASTY;  Surgeon: Elsie Saas, MD;  Location: [place];  Service: Orthopedics;  Laterality: Right;   varicose vein reduction      Current Outpatient Medications  Medication Sig Dispense Refill   acetaminophen (TYLENOL) 500 MG tablet Take 1,000 mg by mouth 2 (two) times daily.     albuterol (PROVENTIL HFA;VENTOLIN HFA) 108 (90 Base) MCG/ACT inhaler Inhale 2 puffs into the lungs every 4 (four) hours as needed for shortness of breath (only  if you can't catch your breath/ asthma).     atorvastatin (LIPITOR) 20 MG tablet Take 20 mg by mouth at bedtime.     bisoprolol (ZEBETA) 5 MG tablet TAKE 1 AND 1/2 TABLET BY MOUTH TWICE DAILY. 90 tablet 3   BREZTRI AEROSPHERE 160-9-4.8 MCG/ACT AERO INHALE 2 PUFFS INTO THE LUNGS (IN THE MORNING AND AT BEDTIME). 10.7 g 5   Carboxymethylcellul-Glycerin (LUBRICATING EYE DROPS OP) Place 1 drop into the right eye daily as needed (dry eyes).     diltiazem (CARTIA XT) 120 MG 24 hr capsule TAKE (1) CAPSULE BY MOUTH DAILY, MAY TAKE A EXTRA CAPSULE DAILY FOR BREAKTHROUGH AFIB. 180 capsule 3   diphenhydramine-acetaminophen (TYLENOL PM) 25-500 MG TABS tablet Take 1 tablet by mouth at bedtime as needed (sleep/pain).     Dupilumab (DUPIXENT) 300 MG/2ML SOPN Inject 300 mg into the skin every 14 (fourteen) days. 12 mL 1   EPINEPHrine 0.3 mg/0.3 mL IJ SOAJ injection Inject 0.3 mg into the muscle as needed for anaphylaxis. 1 each 5   flecainide (TAMBOCOR) 150 MG tablet Take 1/2 tablet by mouth 2 times per day. Please keep upcoming appointment for future refills. Thank you. 90 tablet 0   furosemide  (LASIX) 40 MG tablet TAKE 1 TABLET BY MOUTH DAILY 90 tablet 3   glipiZIDE (GLUCOTROL XL) 5 MG 24 hr tablet Take 5 mg by mouth daily before breakfast.     hydrocortisone cream 0.5 % Apply 1 Application topically daily as needed for itching.     JARDIANCE 25 MG TABS tablet Take 25 mg by mouth daily before breakfast.     levothyroxine (SYNTHROID) 137 MCG tablet Take 137 mcg by mouth daily before breakfast.     losartan (COZAAR) 50 MG tablet Take 50 mg by mouth at bedtime.     metFORMIN (GLUCOPHAGE-XR) 500 MG 24 hr tablet Take 500 mg by mouth in the morning and at bedtime.     Multiple Vitamin (MULTIVITAMIN) tablet Take 1 tablet by mouth daily.     omeprazole (PRILOSEC OTC) 20 MG tablet Take 20 mg by mouth every morning.     Respiratory Therapy Supplies (FLUTTER) DEVI Use as directed 1 each 0   rivaroxaban (XARELTO) 20 MG TABS tablet Take 20 mg by mouth at bedtime.     No current facility-administered medications for this encounter.    Allergies  Allergen Reactions   Food Anaphylaxis and Shortness Of Breath    TREE NUTS   Iodinated Contrast Media Hives and Shortness Of Breath    Patient states hives to throat closing. (01/15/17: patient states this reaction was "about 20 years ago" with possibly an IVP.  He now says high doses of prednisone "throw me into AFib."  He has tolerated CT arthrograms with Benadrly 16m PO one hour before injection.  JBrita Romp RN)   Shellfish Allergy Anaphylaxis and Shortness Of Breath    To shellfish, crabs.  Makes him feel like "things are crawling all over" me.  Denies airway issues with these foods.  (Brita Romp RN 01/15/17)   Goat-Derived Products Hives    GOAT CHEESE    Prednisone Palpitations    PRECIPITATES A-FIB   Metformin And Related Diarrhea    High doses at once   Vancomycin Anxiety    Red man syndrome   Voltaren [Diclofenac Sodium] Other (See Comments)    Feels like things are crawling on him    Social History   Socioeconomic History    Marital status: Widowed  Spouse name: Not on file   Number of children: Not on file   Years of education: Not on file   Highest education level: Not on file  Occupational History   Not on file  Tobacco Use   Smoking status: Former    Packs/day: 3.00    Years: 25.00    Total pack years: 75.00    Types: Cigarettes    Start date: 02/20/1958    Quit date: 02/12/1984    Years since quitting: 38.8   Smokeless tobacco: Former    Types: Chew   Tobacco comments:    Former smoker 08/30/2021  Vaping Use   Vaping Use: Never used  Substance and Sexual Activity   Alcohol use: Yes    Alcohol/week: 2.0 - 4.0 standard drinks of alcohol    Types: 1 - 2 Glasses of wine, 1 - 2 Cans of beer per week    Comment: 1 glass of wine or beer 3 times a week 12/26/22   Drug use: No   Sexual activity: Not on file  Other Topics Concern   Not on file  Social History Narrative   Regular exercise: No   Social Determinants of Health   Financial Resource Strain: Not on file  Food Insecurity: Not on file  Transportation Needs: Not on file  Physical Activity: Not on file  Stress: Not on file  Social Connections: Not on file  Intimate Partner Violence: Not on file     ROS- All systems are reviewed and negative except as per the HPI above.  Physical Exam: Vitals:   12/26/22 0818  BP: 122/82  Pulse: 71  Weight: 106.6 kg  Height: _0  (1.753 m)     GEN- The patient is a well appearing elderly male, alert and oriented x 3 today.   HEENT-head normocephalic, atraumatic, sclera clear, conjunctiva pink, hearing intact, trachea midline. Lungs- Clear to ausculation bilaterally, normal work of breathing Heart- Regular rate and rhythm, no murmurs, rubs or gallops  GI- soft, NT, ND, + BS Extremities- no clubbing, cyanosis, trace lower extremity edema R>L MS- no significant deformity or atrophy Skin- no rash or lesion Psych- euthymic mood, full affect Neuro- strength and sensation are intact   Wt  Readings from Last 3 Encounters:  12/26/22 106.6 kg  11/25/22 104.2 kg  10/17/22 104.3 kg    EKG today demonstrates V pacing with underlying afib Vent. rate 71 BPM PR interval * ms QRS duration 180 ms QT/QTcB 474/515 ms  Echo 08/13/22 demonstrated   1. Left ventricular ejection fraction, by estimation, is 55 to 60%. The  left ventricle has normal function. The left ventricle has no regional  wall motion abnormalities. Left ventricular diastolic parameters are  consistent with Grade III diastolic dysfunction (restrictive). There is the interventricular septum is flattened in systole and diastole, consistent with right ventricular pressure and volume overload.   2. Right ventricular systolic function is moderately reduced. The right  ventricular size is moderately enlarged. There is severely elevated  pulmonary artery systolic pressure. The estimated right ventricular  systolic pressure is 59.9 mmHg.   3. Left atrial size was severely dilated.   4. Right atrial size was severely dilated.   5. A small pericardial effusion is present. The pericardial effusion is  posterior to the left ventricle.   6. The mitral valve is degenerative, mildly calcified and with somewhat  restricted posterior leaflet. Mild mitral valve regurgitation.   7. The aortic valve has an indeterminant number of cusps. There  is  moderate calcification of the aortic valve. Aortic valve regurgitation is  not visualized. 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  Aortic valve mean gradient measures 12.5 mmHg. Dimentionless index 0.28.   8. The inferior vena cava is dilated in size with >50% respiratory  variability, suggesting right atrial pressure of 8 mmHg.   Comparison(s): Prior images unable to be directly viewed.   Epic records are reviewed at length today   CHA2DS2-VASc Score = 4  The patient's score is based upon: CHF History: 0 HTN History:  1 Diabetes History: 1 Stroke History: 0 Vascular Disease History: 0 Age Score: 2 Gender Score: 0       ASSESSMENT AND PLAN: 1. Persistent Atrial Fibrillation (ICD10:  I48.19) The patient's CHA2DS2-VASc score is 4, indicating a 4.8% annual risk of stroke.   Patient in symptomatic persistent afib. We discussed rhythm control options today including DCCV vs changing AAD. Short term, will plan for DCCV. Patient would like to pursue dofetilide admission. He will need to stop flecainide 3 days before admission. He will stop Tylenol PM. Patient to check on price of dofetilide. PharmD to screen medications QTc in SR 478 ms in setting of RBBB Continue flecainide 75 mg BID for now. Continue diltiazem 120 mg daily  Continue bisoprolol 7.5 mg BID Continue Xarelto 20 mg daily   2. Secondary Hypercoagulable State (ICD10:  D68.69) The patient is at significant risk for stroke/thromboembolism based upon his CHA2DS2-VASc Score of 4.  Continue Rivaroxaban (Xarelto).   3. SSS S/p PPM, followed by Dr Lovena Le and the device clinic.  4. HTN Stable, no changes today.   Follow up in the AF clinic post DCCV for dofetilide admission.    [facility] [address] [place],  [address] [phone] 12/26/2022 8:28 AM

## 2022-12-26 NOTE — Patient Instructions (Addendum)
Cardioversion scheduled for: 01/08/2023   - Arrive at the [facility] and go to admitting at 9:30am   - Do not eat or drink anything after midnight the night prior to your procedure.   - Take all your morning medication (except diabetic medications) with a sip of water prior to arrival.  - You will not be able to drive home after your procedure.    - Do NOT miss any doses of your blood thinner - if you should miss a dose please notify our office immediately.   - If you feel as if you go back into normal rhythm prior to scheduled cardioversion, please notify our office immediately.  If your procedure is canceled in the cardioversion suite you will be charged a cancellation fee.   If you are on weekly OZEMPIC, TRULICITY, MOUNJARO, WEGOVY, OR BYDUREON  Hold medication 7 days prior to scheduled procedure/anesthesia.  Restart medication on the normal dosing day after scheduled procedure/anesthesia  If you are on daily BYETTA, VICTOZA, ADLYXIN, OR RYBELSUS:   Hold medication 24 hours prior to scheduled procedure/anesthesia.   Restart medication on the following day after scheduled procedure/anesthesia   For those patients who have a scheduled procedure/anesthesia on the same day of the week as their dose, hold the medication on the day of surgery.  They can take their scheduled dose the week before.  **Patients on the above medications scheduled for elective procedures that have not held the medication for the appropriate amount of time are at risk of cancellation or change in the anesthetic plan.

## 2022-12-30 ENCOUNTER — Telehealth: Payer: Self-pay | Admitting: Pharmacist

## 2022-12-30 ENCOUNTER — Other Ambulatory Visit (HOSPITAL_COMMUNITY): Payer: Self-pay | Admitting: *Deleted

## 2022-12-30 DIAGNOSIS — I4819 Other persistent atrial fibrillation: Secondary | ICD-10-CM

## 2022-12-30 NOTE — Telephone Encounter (Signed)
Medication list reviewed in anticipation of upcoming Tikosyn initiation. Patient is using albuterol and Breztri inhalers which are QTc prolonging but ok to continue. He takes metformin which can increase concentrations of Tikosyn but is ok to continue. He has Tylenol PM on his med list which contains Benadryl and should be discontinued. His flecainide will need to be stopped at least 3 days prior to Tikosyn load.   Patient is anticoagulated on Xarelto 20mg  daily on the appropriate dose. Please ensure that patient has not missed any anticoagulation doses in the 3 weeks prior to Tikosyn initiation.   Patient will need to be counseled to avoid use of Benadryl while on Tikosyn and in the 2-3 days prior to Tikosyn initiation.

## 2023-01-02 NOTE — Telephone Encounter (Signed)
Patient notified to stop flecainide and tylenol PM at least 3 days prior to admission. Pt verbalized understanding.

## 2023-01-05 ENCOUNTER — Encounter: Payer: Self-pay | Admitting: Pulmonary Disease

## 2023-01-05 ENCOUNTER — Other Ambulatory Visit (HOSPITAL_COMMUNITY): Payer: Self-pay

## 2023-01-05 NOTE — Telephone Encounter (Signed)
Beth can you advise since Dr. Patsey Berthold is out of the office. Thank you!

## 2023-01-05 NOTE — Telephone Encounter (Signed)
No problem.

## 2023-01-07 ENCOUNTER — Other Ambulatory Visit: Payer: Self-pay

## 2023-01-07 ENCOUNTER — Other Ambulatory Visit (HOSPITAL_COMMUNITY): Payer: Medicare Other | Admitting: Physician Assistant

## 2023-01-08 ENCOUNTER — Other Ambulatory Visit (HOSPITAL_COMMUNITY): Payer: Self-pay

## 2023-01-08 ENCOUNTER — Ambulatory Visit (HOSPITAL_COMMUNITY): Admission: RE | Admit: 2023-01-08 | Payer: Medicare Other | Source: Home / Self Care | Admitting: Internal Medicine

## 2023-01-08 ENCOUNTER — Encounter (HOSPITAL_COMMUNITY): Admission: RE | Payer: Self-pay | Source: Home / Self Care

## 2023-01-08 SURGERY — CARDIOVERSION
Anesthesia: Monitor Anesthesia Care

## 2023-01-09 ENCOUNTER — Encounter (HOSPITAL_COMMUNITY): Payer: Self-pay

## 2023-01-13 ENCOUNTER — Inpatient Hospital Stay (HOSPITAL_COMMUNITY)
Admission: AD | Admit: 2023-01-13 | Discharge: 2023-01-16 | DRG: 309 | Disposition: A | Discharge status: Home / Self Care | Payer: Medicare Other | Source: Ambulatory Visit | Attending: Internal Medicine | Admitting: Internal Medicine

## 2023-01-13 ENCOUNTER — Ambulatory Visit (HOSPITAL_COMMUNITY)
Admission: RE | Admit: 2023-01-13 | Discharge: 2023-01-13 | Disposition: A | Discharge status: Home / Self Care | Payer: Medicare Other | Source: Ambulatory Visit | Attending: Physician Assistant | Admitting: Physician Assistant

## 2023-01-13 ENCOUNTER — Encounter (HOSPITAL_COMMUNITY): Payer: Self-pay | Admitting: Internal Medicine

## 2023-01-13 ENCOUNTER — Encounter (HOSPITAL_COMMUNITY): Payer: Self-pay | Admitting: Physician Assistant

## 2023-01-13 ENCOUNTER — Other Ambulatory Visit: Payer: Self-pay

## 2023-01-13 VITALS — BP 140/90 | HR 80 | Ht 69.0 in | Wt 235.0 lb

## 2023-01-13 DIAGNOSIS — I110 Hypertensive heart disease with heart failure: Secondary | ICD-10-CM | POA: Diagnosis present

## 2023-01-13 DIAGNOSIS — Z91041 Radiographic dye allergy status: Secondary | ICD-10-CM

## 2023-01-13 DIAGNOSIS — G4733 Obstructive sleep apnea (adult) (pediatric): Secondary | ICD-10-CM | POA: Diagnosis present

## 2023-01-13 DIAGNOSIS — Z7984 Long term (current) use of oral hypoglycemic drugs: Secondary | ICD-10-CM | POA: Diagnosis not present

## 2023-01-13 DIAGNOSIS — I5032 Chronic diastolic (congestive) heart failure: Secondary | ICD-10-CM | POA: Diagnosis present

## 2023-01-13 DIAGNOSIS — Z823 Family history of stroke: Secondary | ICD-10-CM | POA: Diagnosis not present

## 2023-01-13 DIAGNOSIS — E039 Hypothyroidism, unspecified: Secondary | ICD-10-CM | POA: Diagnosis present

## 2023-01-13 DIAGNOSIS — Z96653 Presence of artificial knee joint, bilateral: Secondary | ICD-10-CM | POA: Diagnosis present

## 2023-01-13 DIAGNOSIS — E785 Hyperlipidemia, unspecified: Secondary | ICD-10-CM | POA: Diagnosis present

## 2023-01-13 DIAGNOSIS — Z888 Allergy status to other drugs, medicaments and biological substances status: Secondary | ICD-10-CM

## 2023-01-13 DIAGNOSIS — Z85828 Personal history of other malignant neoplasm of skin: Secondary | ICD-10-CM | POA: Diagnosis not present

## 2023-01-13 DIAGNOSIS — E119 Type 2 diabetes mellitus without complications: Secondary | ICD-10-CM | POA: Diagnosis present

## 2023-01-13 DIAGNOSIS — Z950 Presence of cardiac pacemaker: Secondary | ICD-10-CM

## 2023-01-13 DIAGNOSIS — Z79899 Other long term (current) drug therapy: Secondary | ICD-10-CM

## 2023-01-13 DIAGNOSIS — J449 Chronic obstructive pulmonary disease, unspecified: Secondary | ICD-10-CM | POA: Diagnosis present

## 2023-01-13 DIAGNOSIS — Z8249 Family history of ischemic heart disease and other diseases of the circulatory system: Secondary | ICD-10-CM

## 2023-01-13 DIAGNOSIS — Z7901 Long term (current) use of anticoagulants: Secondary | ICD-10-CM

## 2023-01-13 DIAGNOSIS — D6869 Other thrombophilia: Secondary | ICD-10-CM | POA: Diagnosis present

## 2023-01-13 DIAGNOSIS — I495 Sick sinus syndrome: Secondary | ICD-10-CM | POA: Diagnosis present

## 2023-01-13 DIAGNOSIS — Z91013 Allergy to seafood: Secondary | ICD-10-CM

## 2023-01-13 DIAGNOSIS — Z91018 Allergy to other foods: Secondary | ICD-10-CM

## 2023-01-13 DIAGNOSIS — I4819 Other persistent atrial fibrillation: Secondary | ICD-10-CM | POA: Diagnosis present

## 2023-01-13 DIAGNOSIS — Z79890 Hormone replacement therapy (postmenopausal): Secondary | ICD-10-CM

## 2023-01-13 DIAGNOSIS — Z87891 Personal history of nicotine dependence: Secondary | ICD-10-CM

## 2023-01-13 DIAGNOSIS — I4510 Unspecified right bundle-branch block: Secondary | ICD-10-CM | POA: Diagnosis present

## 2023-01-13 DIAGNOSIS — Z833 Family history of diabetes mellitus: Secondary | ICD-10-CM | POA: Diagnosis not present

## 2023-01-13 DIAGNOSIS — K219 Gastro-esophageal reflux disease without esophagitis: Secondary | ICD-10-CM | POA: Diagnosis present

## 2023-01-13 LAB — GLUCOSE, CAPILLARY: Glucose-Capillary: 133 mg/dL — ABNORMAL HIGH (ref 70–99)

## 2023-01-13 LAB — BASIC METABOLIC PANEL
Anion gap: 10 (ref 5–15)
BUN: 26 mg/dL — ABNORMAL HIGH (ref 8–23)
CO2: 23 mmol/L (ref 22–32)
Calcium: 9.2 mg/dL (ref 8.9–10.3)
Chloride: 104 mmol/L (ref 98–111)
Creatinine, Ser: 1.03 mg/dL (ref 0.61–1.24)
GFR, Estimated: >60 mL/min (ref >60)
Glucose, Bld: 177 mg/dL — ABNORMAL HIGH (ref 70–99)
Potassium: 4.6 mmol/L (ref 3.5–5.1)
Sodium: 137 mmol/L (ref 135–145)

## 2023-01-13 LAB — MAGNESIUM: Magnesium: 2.3 mg/dL (ref 1.7–2.4)

## 2023-01-13 MED ORDER — BISOPROLOL FUMARATE 5 MG PO TABS
7.5000 mg | ORAL_TABLET | Freq: Every day | ORAL | Status: DC
  Administered 2023-01-14 – 2023-01-16 (×3): 7.5 mg via ORAL
  Filled 2023-01-13 (×3): qty 2

## 2023-01-13 MED ORDER — SODIUM CHLORIDE 0.9 % IV SOLN: 250.0000 mL | INTRAVENOUS | Status: DC | PRN

## 2023-01-13 MED ORDER — LOSARTAN POTASSIUM 50 MG PO TABS
50.0000 mg | ORAL_TABLET | Freq: Every day | ORAL | Status: DC
  Administered 2023-01-13 – 2023-01-15 (×3): 50 mg via ORAL
  Filled 2023-01-13 (×3): qty 1

## 2023-01-13 MED ORDER — METFORMIN HCL ER 500 MG PO TB24
500.0000 mg | ORAL_TABLET | Freq: Two times a day (BID) | ORAL | Status: DC
  Administered 2023-01-13 – 2023-01-16 (×6): 500 mg via ORAL
  Filled 2023-01-13 (×7): qty 1

## 2023-01-13 MED ORDER — EMPAGLIFLOZIN 25 MG PO TABS
25.0000 mg | ORAL_TABLET | Freq: Every day | ORAL | Status: DC
  Administered 2023-01-14 – 2023-01-16 (×3): 25 mg via ORAL
  Filled 2023-01-13 (×3): qty 1

## 2023-01-13 MED ORDER — DILTIAZEM HCL ER COATED BEADS 120 MG PO CP24
120.0000 mg | ORAL_CAPSULE | Freq: Every day | ORAL | Status: DC
  Administered 2023-01-14 – 2023-01-16 (×3): 120 mg via ORAL
  Filled 2023-01-13 (×3): qty 1

## 2023-01-13 MED ORDER — BUDESON-GLYCOPYRROL-FORMOTEROL 160-9-4.8 MCG/ACT IN AERO: INHALATION_SPRAY | Freq: Two times a day (BID) | RESPIRATORY_TRACT | Status: DC

## 2023-01-13 MED ORDER — DOFETILIDE 500 MCG PO CAPS
500.0000 ug | ORAL_CAPSULE | Freq: Two times a day (BID) | ORAL | Status: DC
  Administered 2023-01-13 – 2023-01-16 (×6): 500 ug via ORAL
  Filled 2023-01-13 (×6): qty 1

## 2023-01-13 MED ORDER — MOMETASONE FURO-FORMOTEROL FUM 200-5 MCG/ACT IN AERO
2.0000 | INHALATION_SPRAY | Freq: Two times a day (BID) | RESPIRATORY_TRACT | Status: DC
  Administered 2023-01-13 – 2023-01-16 (×6): 2 via RESPIRATORY_TRACT
  Filled 2023-01-13: qty 8.8

## 2023-01-13 MED ORDER — SODIUM CHLORIDE 0.9% FLUSH: 3.0000 mL | INTRAVENOUS | Status: DC | PRN

## 2023-01-13 MED ORDER — UMECLIDINIUM BROMIDE 62.5 MCG/ACT IN AEPB
1.0000 | INHALATION_SPRAY | Freq: Every day | RESPIRATORY_TRACT | Status: DC
  Administered 2023-01-14 – 2023-01-16 (×3): 1 via RESPIRATORY_TRACT
  Filled 2023-01-13: qty 7

## 2023-01-13 MED ORDER — ACETAMINOPHEN 500 MG PO TABS
1000.0000 mg | ORAL_TABLET | Freq: Two times a day (BID) | ORAL | Status: DC
  Administered 2023-01-13 – 2023-01-16 (×6): 1000 mg via ORAL
  Filled 2023-01-13 (×6): qty 2

## 2023-01-13 MED ORDER — POLYVINYL ALCOHOL 1.4 % OP SOLN: 1.0000 [drp] | Freq: Every day | OPHTHALMIC | Status: DC | PRN

## 2023-01-13 MED ORDER — ATORVASTATIN CALCIUM 10 MG PO TABS
20.0000 mg | ORAL_TABLET | Freq: Every day | ORAL | Status: DC
  Administered 2023-01-13 – 2023-01-15 (×3): 20 mg via ORAL
  Filled 2023-01-13 (×3): qty 2

## 2023-01-13 MED ORDER — PANTOPRAZOLE SODIUM 40 MG PO TBEC
40.0000 mg | DELAYED_RELEASE_TABLET | Freq: Every morning | ORAL | Status: DC
  Administered 2023-01-14 – 2023-01-16 (×3): 40 mg via ORAL
  Filled 2023-01-13 (×3): qty 1

## 2023-01-13 MED ORDER — SODIUM CHLORIDE 0.9% FLUSH
3.0000 mL | Freq: Two times a day (BID) | INTRAVENOUS | Status: DC
  Administered 2023-01-14 – 2023-01-16 (×5): 3 mL via INTRAVENOUS

## 2023-01-13 MED ORDER — GLIPIZIDE ER 5 MG PO TB24
5.0000 mg | ORAL_TABLET | Freq: Every day | ORAL | Status: DC
  Administered 2023-01-14 – 2023-01-16 (×3): 5 mg via ORAL
  Filled 2023-01-13 (×3): qty 1

## 2023-01-13 MED ORDER — LEVOTHYROXINE SODIUM 25 MCG PO TABS
137.0000 ug | ORAL_TABLET | Freq: Every day | ORAL | Status: DC
  Administered 2023-01-14 – 2023-01-16 (×3): 137 ug via ORAL
  Filled 2023-01-13 (×3): qty 1

## 2023-01-13 MED ORDER — FUROSEMIDE 40 MG PO TABS
40.0000 mg | ORAL_TABLET | Freq: Every day | ORAL | Status: DC
  Administered 2023-01-14 – 2023-01-16 (×3): 40 mg via ORAL
  Filled 2023-01-13 (×3): qty 1

## 2023-01-13 MED ORDER — RIVAROXABAN 20 MG PO TABS
20.0000 mg | ORAL_TABLET | Freq: Every day | ORAL | Status: DC
  Administered 2023-01-13 – 2023-01-15 (×3): 20 mg via ORAL
  Filled 2023-01-13 (×3): qty 1

## 2023-01-13 MED ORDER — ALBUTEROL SULFATE (2.5 MG/3ML) 0.083% IN NEBU: 2.5000 mg | INHALATION_SOLUTION | RESPIRATORY_TRACT | Status: DC | PRN

## 2023-01-13 NOTE — Plan of Care (Signed)
  Problem: Education: Goal: Understanding of medication regimen will improve Outcome: Progressing   Problem: Activity: Goal: Ability to tolerate increased activity will improve Outcome: Progressing   Problem: Cardiac: Goal: Ability to achieve and maintain adequate cardiopulmonary perfusion will improve Outcome: Progressing   Problem: Education: Goal: Knowledge of disease or condition will improve Outcome: Completed/Met   Problem: Health Behavior/Discharge Planning: Goal: Ability to safely manage health-related needs after discharge will improve Outcome: Completed/Met

## 2023-01-13 NOTE — H&P (Addendum)
Electrophysiology H&P  Note    Primary Care Physician: [name], MD Primary Electrophysiologist: Dr Lovena Le Referring Physician: Dr Carlos Levering is a 77 y.o. male with a history of SSS s/p PPM, atrial fibrillation, chronic diastolic CHF, HTN, COPD, DM, and GERD who presents for follow up in the [facility]. Patient was recently admitted with elevated temp and AMS and was diagnosed with severe sepsis 2/2 pneumonia. During his admission, he developed afib with RVR. He was given a bolus of flecainide which converted him to atrial flutter. Patient was fairly asymptomatic. He is on Xarelto for a CHADS2VASC score of 3.  The device clinic received an alert for an ongoing afib episode starting 04/09/21. He did have two alcohol drinks the night before and was also being treated for an URI. COVID test was negative. He was back in SR before follow up.  Patient was noted to be out of rhythm by the device clinic on 08/23/21 and underwent DCCV on 09/05/21. He had recurrent afib and underwent another DCCV on 08/06/22.  The device clinic received an alert for an ongoing afib episode starting 12/07/22. Patient presents today for dofetilide admission. He remains in afib with increased SOB. He denies any missed doses of anticoagulation in the last 3 weeks.   Today, he denies symptoms of palpitations, chest pain, orthopnea, PND, dizziness, presyncope, syncope, snoring, daytime somnolence, bleeding, or neurologic sequela. The patient is tolerating medications without difficulties and is otherwise without complaint today.    Atrial Fibrillation Risk Factors:  he does not have symptoms or diagnosis of sleep apnea. he does not have a history of rheumatic fever. he does not have a history of alcohol use. The patient does not have a history of early familial atrial fibrillation or other arrhythmias.  he has a BMI of 34.7  Atrial Fibrillation Management history:  Previous  antiarrhythmic drugs: flecainide Previous cardioversions: 11/2016, 09/05/21 Previous ablations: none CHADS2VASC score: 4 Anticoagulation history: Xarelto  Current Facility-Administered Medications  Medication Dose Route Frequency Provider Last Rate Last Admin   0.9 %  sodium chloride infusion  250 mL Intravenous PRN Fenton, Clint R, PA       acetaminophen (TYLENOL) tablet 1,000 mg  1,000 mg Oral BID Fenton, Clint R, PA       albuterol (PROVENTIL) (2.5 MG/3ML) 0.083% nebulizer solution 2.5 mg  2.5 mg Inhalation Q4H PRN Fenton, Clint R, PA       atorvastatin (LIPITOR) tablet 20 mg  20 mg Oral QHS Fenton, Clint R, PA       [START ON 01/14/2023] bisoprolol (ZEBETA) tablet 7.5 mg  7.5 mg Oral Daily Fenton, Clint R, PA       Budeson-Glycopyrrol-Formoterol 160-9-4.8 MCG/ACT AERO   Inhalation BID Fenton, Clint R, PA       [START ON 01/14/2023] diltiazem (CARDIZEM CD) 24 hr capsule 120 mg  120 mg Oral Daily Fenton, Clint R, PA       dofetilide (TIKOSYN) capsule 500 mcg  500 mcg Oral BID Fenton, Clint R, PA       [START ON 01/14/2023] empagliflozin (JARDIANCE) tablet 25 mg  25 mg Oral QAC breakfast Fenton, Clint R, PA       [START ON 01/14/2023] furosemide (LASIX) tablet 40 mg  40 mg Oral Daily Fenton, Clint R, PA       [START ON 01/14/2023] glipiZIDE (GLUCOTROL XL) 24 hr tablet 5 mg  5 mg Oral QAC breakfast Fenton, Clint R, PA       [  START ON 01/14/2023] levothyroxine (SYNTHROID) tablet 137 mcg  137 mcg Oral QAC breakfast Fenton, Clint R, PA       losartan (COZAAR) tablet 50 mg  50 mg Oral QHS Fenton, Clint R, PA       metFORMIN (GLUCOPHAGE-XR) 24 hr tablet 500 mg  500 mg Oral BID WC Fenton, Clint R, PA       [START ON 01/14/2023] pantoprazole (PROTONIX) EC tablet 40 mg  40 mg Oral q morning Fenton, Clint R, PA       polyvinyl alcohol (LIQUIFILM TEARS) 1.4 % ophthalmic solution 1 drop  1 drop Right Eye Daily PRN Fenton, Clint R, PA       rivaroxaban (XARELTO) tablet 20 mg  20 mg Oral Q supper Fenton, Clint R,  PA       sodium chloride flush (NS) 0.9 % injection 3 mL  3 mL Intravenous Q12H Fenton, Clint R, PA       sodium chloride flush (NS) 0.9 % injection 3 mL  3 mL Intravenous PRN Fenton, Clint R, PA        Allergies, Past Medical, Surgical, Social, and Family Histories have been reviewed and are referenced here-in when relevant for medical decision making.    Physical Exam:    Vitals:    01/13/23 1121  BP: (!) 140/90  Pulse: 80  Weight: 106.6 kg  Height: 5\' 9"  (1.753 m)    GEN- The patient is a well appearing elderly male, alert and oriented x 3 today.   HEENT- Normocephalic, atraumatic.  Lungs-  CTAB, normal work of breathing Heart- irregular rate and rhythm, no murmurs, rubs or gallops  GI- soft, NT, ND Extremities- no clubbing, cyanosis, or edema  Wt Readings from Last 3 Encounters:  01/13/23 106.6 kg  12/26/22 106.6 kg  11/25/22 104.2 kg   EKG today demonstrates Afib with intermittent V pacing, RBBB Vent. rate 80 BPM PR interval * ms QRS duration 154 ms QT/QTcB 402/463 ms  Echo 08/13/22 EF 55-60%   CHA2DS2-VASc Score = 4  The patient's score is based upon: CHF History: 0 HTN History: 1 Diabetes History: 1 Stroke History: 0 Vascular Disease History: 0 Age Score: 2 Gender Score: 0       ASSESSMENT AND PLAN: 1. Persistent Atrial Fibrillation (ICD10:  I48.19) The patient's CHA2DS2-VASc score is 4, indicating a 4.8% annual risk of stroke.   Patient presents for dofetilide admission. Continue Xarelto 20 mg daily, states no missed doses in the last 3 weeks. No recent benadryl use PharmD has screened medications. He has stopped flecainide 3 days ago. Labs today show creatinine at 1.03, K+ 4.6 and mag 2.3, CrCl calculated at 92 mL/min QTc in SR 478 ms in setting of RBBB Continue diltiazem 120 mg daily  Continue bisoprolol 7.5 mg BID  2. Secondary Hypercoagulable State (ICD10:  D68.69) The patient is at significant risk for stroke/thromboembolism based upon his  CHA2DS2-VASc Score of 4.  Continue Rivaroxaban (Xarelto).   3. SSS S/p PPM, followed by Dr 08/15/22 and the device clinic.  4. HTN Stable, no changes today.  The patient presents for Tikosyn admission as above.   Ladona Ridgel [address]" [place], PA-C  01/13/2023 4:12 PM   EP Attending  Patient seen and examined. Agree with above. The patient has a h/o PAF and now persistent atrial fib and presents today for initiation of dofetilide. His QT interval is acceptable and he has not missed any doses of xarelto. His exam demonstrates a well appearing man, NAD  with clear lungs and IRIR rhythm. He has 2+ edema. He will be admitted for initiation of dofetilide. DCCV is planned for 1/18 if he does not convert in the interim. Dosing will be dependent on his renal function which is acceptable.  Sharlot Gowda Tylerjames Hoglund,MD

## 2023-01-13 NOTE — Care Management (Signed)
  Transition of Care [facility]) Screening Note   Patient Details  Name: Clarence Dawson Date of Birth: 01-13-1946   Transition of Care [facility]) CM/SW Contact:    Gala Lewandowsky, RN Phone Number: 01/13/2023, 4:01 PM    Transition of Care Department [facility]) has reviewed the patient. Patient presented for Tikosyn Load. Benefits check submitted for cost. Case Manager will follow for cost and pharmacy of choice as the patient progresses.

## 2023-01-13 NOTE — Progress Notes (Signed)
Post Tikosyn EKG obtained showing QTc 495, VPaced HR 74. Will continue to monitor. Dierdre Highman, RN

## 2023-01-13 NOTE — Progress Notes (Signed)
Pt. Has his own cpap from home. RT assisted pt. With setting up his cpap with sterile water.

## 2023-01-13 NOTE — Progress Notes (Signed)
Primary Care Physician: [name], MD Primary Electrophysiologist: Dr Lovena Le Referring Physician: Dr Carlos Levering is a 77 y.o. male with a history of SSS s/p PPM, atrial fibrillation, chronic diastolic CHF, HTN, COPD, DM, and GERD who presents for follow up in the [facility]. Patient was recently admitted with elevated temp and AMS and was diagnosed with severe sepsis 2/2 pneumonia. During his admission, he developed afib with RVR. He was given a bolus of flecainide which converted him to atrial flutter. Patient was fairly asymptomatic. He is on Xarelto for a CHADS2VASC score of 3.  The device clinic received an alert for an ongoing afib episode starting 04/09/21. He did have two alcohol drinks the night before and was also being treated for an URI. COVID test was negative. He was back in SR before follow up.  Patient was noted to be out of rhythm by the device clinic on 08/23/21 and underwent DCCV on 09/05/21. He had recurrent afib and underwent another DCCV on 08/06/22.  The device clinic received an alert for an ongoing afib episode starting 12/07/22. Patient presents today for dofetilide admission. He remains in afib with increased SOB. He denies any missed doses of anticoagulation in the last 3 weeks.   Today, he denies symptoms of palpitations, chest pain, orthopnea, PND, dizziness, presyncope, syncope, snoring, daytime somnolence, bleeding, or neurologic sequela. The patient is tolerating medications without difficulties and is otherwise without complaint today.    Atrial Fibrillation Risk Factors:  he does not have symptoms or diagnosis of sleep apnea. he does not have a history of rheumatic fever. he does not have a history of alcohol use. The patient does not have a history of early familial atrial fibrillation or other arrhythmias.  he has a BMI of Body mass index is 34.7 kg/m.Marland Kitchen Filed Weights   01/13/23 1121  Weight: 106.6 kg    Family  History  Problem Relation Age of Onset   Liver cancer Mother    Pancreatic cancer Mother    Colon cancer Mother    Hypertension Father    Stroke Father    Other Father 4       Sudden Cardiac death   Heart attack Father    Cancer Sister        brain   Diabetes Sister    Colon cancer Maternal Aunt    Colon polyps Neg Hx      Atrial Fibrillation Management history:  Previous antiarrhythmic drugs: flecainide Previous cardioversions: 11/2016, 09/05/21 Previous ablations: none CHADS2VASC score: 4 Anticoagulation history: Xarelto   Past Medical History:  Diagnosis Date   Allergic rhinitis    Aortic valve disorder    Asthma    since childhood- seasonal allergies induced   Cancer ([place])    Skin cancer- squamous, basal   Carotid artery stenosis    Essential hypertension    Full dentures    GERD (gastroesophageal reflux disease)    H/O hiatal hernia    Hemorrhage of rectum    Hyperlipidemia    Hypothyroidism    Male circumcision    OSA (obstructive sleep apnea)    Osteoarthritis    Pacemaker    Oct 2005 in [place].   PAF (paroxysmal atrial fibrillation) ([place])    Pneumonia    "several Times" 2015 last time   Primary localized osteoarthritis of right knee 08/11/2017   RBBB (right bundle branch block)    Sinoatrial node dysfunction ([facility])    Syncope  Tricuspid valve disorder    Type 2 diabetes mellitus ([place])    Type II   Past Surgical History:  Procedure Laterality Date   A-V CARDIAC PACEMAKER INSERTION     Sick sinus syndrome DDR pacer   Arthropathy Right 2005   Rebuilding of left thumb and joint    CARDIAC CATHETERIZATION     CARDIAC ELECTROPHYSIOLOGY STUDY AND ABLATION  09/2008   for pvcs, Dr. Loralie Champagne   CARDIOVERSION N/A 12/18/2016   Procedure: CARDIOVERSION;  Surgeon: Pixie Casino, MD;  Location: [facility] ENDOSCOPY;  Service: Cardiovascular;  Laterality: N/A;   CARDIOVERSION N/A 09/05/2021   Procedure: CARDIOVERSION;  Surgeon: Geralynn Rile, MD;   Location: [place];  Service: Cardiovascular;  Laterality: N/A;   CARDIOVERSION N/A 08/06/2022   Procedure: CARDIOVERSION;  Surgeon: Donato Heinz, MD;  Location: [facility] ENDOSCOPY;  Service: Cardiovascular;  Laterality: N/A;   [place]   right wrist   CARPAL TUNNEL RELEASE  05/04/2012   Procedure: CARPAL TUNNEL RELEASE;  Surgeon: Wynonia Sours, MD;  Location: [place];  Service: Orthopedics;  Laterality: Left;   CARPOMETACARPEL SUSPENSION PLASTY Right 11/16/2014   Procedure: SUSPENSIONPLASTY RIGHT THUMB TENDON TRANSFER ABDUCTOR POLLICUS LONGUS EXCISION TRAPEZIUM;  Surgeon: Daryll Brod, MD;  Location: [place];  Service: Orthopedics;  Laterality: Right;   CHOLECYSTECTOMY  1994   CIRCUMCISION     COLONOSCOPY N/A 03/14/2013   Procedure: COLONOSCOPY;  Surgeon: Daneil Dolin, MD;  [facility];  Service: Endoscopy;  Laterality: N/A;  8:15 AM   EYE SURGERY     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-[facility]   EYE SURGERY  2012   Left eye Corneal transplant- partial- Cataract   FLEXIBLE BRONCHOSCOPY Right 06/23/2019   Procedure: FLEXIBLE BRONCHOSCOPY RIGHT;  Surgeon: [name], MD;  Location: [facility] ORS;  Service: Pulmonary;  Laterality: Right;   GALLBLADDER SURGERY  12/01/2006   HAND TENDON SURGERY Left late 1990's   thumb   HEMORROIDECTOMY  2003   INJECTION KNEE Left 08/26/2017   Procedure: LEFT KNEE INJECTION;  Surgeon: Elsie Saas, MD;  Location: [place];  Service: Orthopedics;  Laterality: Left;   left Knee Arthroscopy     [facility]   PARTIAL KNEE ARTHROPLASTY  11/22/2012   Procedure: UNICOMPARTMENTAL KNEE;  Surgeon: Lorn Junes, MD;  Location: [place];  Service: Orthopedics;  Laterality: Left;  left unicompartmental knee arthroplasty   PERMANENT PACEMAKER GENERATOR CHANGE N/A 01/12/2013   Procedure: PERMANENT PACEMAKER GENERATOR CHANGE;  Surgeon: Evans Lance, MD;  Location: [facility] CATH LAB;   Service: Cardiovascular;  Laterality: N/A;   PPM GENERATOR CHANGEOUT N/A 07/07/2022   Procedure: PPM GENERATOR CHANGEOUT;  Surgeon: Evans Lance, MD;  Location: [place] CV LAB;  Service: Cardiovascular;  Laterality: N/A;   Rotator cuff Surgery  2001   Right shoulder   TONSILLECTOMY     TOTAL KNEE ARTHROPLASTY Right 08/26/2017   Procedure: TOTAL KNEE ARTHROPLASTY;  Surgeon: Elsie Saas, MD;  Location: [place];  Service: Orthopedics;  Laterality: Right;   varicose vein reduction      Current Outpatient Medications  Medication Sig Dispense Refill   acetaminophen (TYLENOL) 500 MG tablet Take 1,000 mg by mouth 2 (two) times daily.     albuterol (PROVENTIL HFA;VENTOLIN HFA) 108 (90 Base) MCG/ACT inhaler Inhale 2 puffs into the lungs every 4 (four) hours as needed for shortness of breath (only if you can't catch your breath/ asthma).  atorvastatin (LIPITOR) 20 MG tablet Take 20 mg by mouth at bedtime.     bisoprolol (ZEBETA) 5 MG tablet TAKE 1 AND 1/2 TABLET BY MOUTH TWICE DAILY. 90 tablet 3   BREZTRI AEROSPHERE 160-9-4.8 MCG/ACT AERO INHALE 2 PUFFS INTO THE LUNGS (IN THE MORNING AND AT BEDTIME). 10.7 g 5   Carboxymethylcellul-Glycerin (LUBRICATING EYE DROPS OP) Place 1 drop into the right eye daily as needed (dry eyes).     diltiazem (CARTIA XT) 120 MG 24 hr capsule TAKE (1) CAPSULE BY MOUTH DAILY, MAY TAKE A EXTRA CAPSULE DAILY FOR BREAKTHROUGH AFIB. 180 capsule 3   Dupilumab (DUPIXENT) 300 MG/2ML SOPN Inject 300 mg into the skin every 14 (fourteen) days. 12 mL 1   EPINEPHrine 0.3 mg/0.3 mL IJ SOAJ injection Inject 0.3 mg into the muscle as needed for anaphylaxis. 1 each 5   furosemide (LASIX) 40 MG tablet TAKE 1 TABLET BY MOUTH DAILY 90 tablet 3   glipiZIDE (GLUCOTROL XL) 5 MG 24 hr tablet Take 5 mg by mouth daily before breakfast.     hydrocortisone cream 0.5 % Apply 1 Application topically daily as needed for itching.     JARDIANCE 25 MG TABS tablet Take 25 mg by mouth daily before  breakfast.     levothyroxine (SYNTHROID) 137 MCG tablet Take 137 mcg by mouth daily before breakfast.     losartan (COZAAR) 50 MG tablet Take 50 mg by mouth at bedtime.     metFORMIN (GLUCOPHAGE-XR) 500 MG 24 hr tablet Take 500 mg by mouth in the morning and at bedtime.     Multiple Vitamin (MULTIVITAMIN) tablet Take 1 tablet by mouth daily.     omeprazole (PRILOSEC OTC) 20 MG tablet Take 20 mg by mouth every morning.     Respiratory Therapy Supplies (FLUTTER) DEVI Use as directed 1 each 0   rivaroxaban (XARELTO) 20 MG TABS tablet Take 20 mg by mouth at bedtime.     No current facility-administered medications for this encounter.    Allergies  Allergen Reactions   Food Anaphylaxis and Shortness Of Breath    TREE NUTS   Iodinated Contrast Media Hives and Shortness Of Breath    Patient states hives to throat closing. (01/15/17: patient states this reaction was "about 20 years ago" with possibly an IVP.  He now says high doses of prednisone "throw me into AFib."  He has tolerated CT arthrograms with Benadrly 1m PO one hour before injection.  JBrita Romp RN)   Shellfish Allergy Anaphylaxis and Shortness Of Breath    To shellfish, crabs.  Makes him feel like "things are crawling all over" me.  Denies airway issues with these foods.  (Brita Romp RN 01/15/17)   Goat-Derived Products Hives    GOAT CHEESE    Prednisone Palpitations    PRECIPITATES A-FIB   Metformin And Related Diarrhea    High doses at once   Vancomycin Anxiety    Red man syndrome   Voltaren [Diclofenac Sodium] Other (See Comments)    Feels like things are crawling on him    Social History   Socioeconomic History   Marital status: Widowed    Spouse name: Not on file   Number of children: Not on file   Years of education: Not on file   Highest education level: Not on file  Occupational History   Not on file  Tobacco Use   Smoking status: Former    Packs/day: 3.00    Years: 25.00    Total  pack years: 75.00     Types: Cigarettes    Start date: 02/20/1958    Quit date: 02/12/1984    Years since quitting: 38.9   Smokeless tobacco: Former    Types: Chew   Tobacco comments:    Former smoker 08/30/2021  Vaping Use   Vaping Use: Never used  Substance and Sexual Activity   Alcohol use: Yes    Alcohol/week: 2.0 - 4.0 standard drinks of alcohol    Types: 1 - 2 Glasses of wine, 1 - 2 Cans of beer per week    Comment: 1 glass of wine or beer 3 times a week 12/26/22   Drug use: No   Sexual activity: Not on file  Other Topics Concern   Not on file  Social History Narrative   Regular exercise: No   Social Determinants of Health   Financial Resource Strain: Not on file  Food Insecurity: Not on file  Transportation Needs: Not on file  Physical Activity: Not on file  Stress: Not on file  Social Connections: Not on file  Intimate Partner Violence: Not on file     ROS- All systems are reviewed and negative except as per the HPI above.  Physical Exam: Vitals:   01/13/23 1121  BP: (!) 140/90  Pulse: 80  Weight: 106.6 kg  Height: 5' 9"  (1.753 m)     GEN- The patient is a well appearing elderly male, alert and oriented x 3 today.   HEENT-head normocephalic, atraumatic, sclera clear, conjunctiva pink, hearing intact, trachea midline. Lungs- scattered wheezing bilaterally, normal work of breathing Heart- irregular rate and rhythm, no murmurs, rubs or gallops  GI- soft, NT, ND, + BS Extremities- no clubbing, cyanosis, or edema MS- no significant deformity or atrophy Skin- no rash or lesion Psych- euthymic mood, full affect Neuro- strength and sensation are intact   Wt Readings from Last 3 Encounters:  01/13/23 106.6 kg  12/26/22 106.6 kg  11/25/22 104.2 kg    EKG today demonstrates Afib with intermittent V pacing, RBBB Vent. rate 80 BPM PR interval * ms QRS duration 154 ms QT/QTcB 402/463 ms  Echo 08/13/22 demonstrated   1. Left ventricular ejection fraction, by estimation, is 55  to 60%. The  left ventricle has normal function. The left ventricle has no regional  wall motion abnormalities. Left ventricular diastolic parameters are  consistent with Grade III diastolic dysfunction (restrictive). There is the interventricular septum is flattened in systole and diastole, consistent with right ventricular pressure and volume overload.   2. Right ventricular systolic function is moderately reduced. The right  ventricular size is moderately enlarged. There is severely elevated  pulmonary artery systolic pressure. The estimated right ventricular  systolic pressure is 60.6 mmHg.   3. Left atrial size was severely dilated.   4. Right atrial size was severely dilated.   5. A small pericardial effusion is present. The pericardial effusion is  posterior to the left ventricle.   6. The mitral valve is degenerative, mildly calcified and with somewhat  restricted posterior leaflet. Mild mitral valve regurgitation.   7. The aortic valve has an indeterminant number of cusps. There is  moderate calcification of the aortic valve. Aortic valve regurgitation is  not visualized. 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  Aortic valve mean gradient measures 12.5 mmHg. Dimentionless index 0.28.   8. The inferior vena cava is dilated in size with >50% respiratory  variability, suggesting right atrial pressure of 8  mmHg.   Comparison(s): Prior images unable to be directly viewed.   Epic records are reviewed at length today   CHA2DS2-VASc Score = 4  The patient's score is based upon: CHF History: 0 HTN History: 1 Diabetes History: 1 Stroke History: 0 Vascular Disease History: 0 Age Score: 2 Gender Score: 0       ASSESSMENT AND PLAN: 1. Persistent Atrial Fibrillation (ICD10:  I48.19) The patient's CHA2DS2-VASc score is 4, indicating a 4.8% annual risk of stroke.   Patient presents for dofetilide admission. Continue Xarelto  20 mg daily, states no missed doses in the last 3 weeks. No recent benadryl use PharmD has screened medications. He has stopped flecainide 3 days ago. Labs today show creatinine at 1.03, K+ 4.6 and mag 2.3, CrCl calculated at 92 mL/min QTc in SR 478 ms in setting of RBBB Continue diltiazem 120 mg daily  Continue bisoprolol 7.5 mg BID  2. Secondary Hypercoagulable State (ICD10:  D68.69) The patient is at significant risk for stroke/thromboembolism based upon his CHA2DS2-VASc Score of 4.  Continue Rivaroxaban (Xarelto).   3. SSS S/p PPM, followed by Dr Lovena Le and the device clinic.  4. HTN Stable, no changes today.   To be admitted later today once a bed becomes available.    [facility] [address] [place], [place] [address] [phone] 01/13/2023 11:34 AM

## 2023-01-13 NOTE — Progress Notes (Signed)
Pharmacy: Dofetilide (Tikosyn) - Initial Consult Assessment and Electrolyte Replacement  Pharmacy consulted to assist in monitoring and replacing electrolytes in this 77 y.o. male admitted on 01/13/2023 undergoing dofetilide initiation. First dofetilide dose: 01/13/23 @ 2000  Assessment:  Patient Exclusion Criteria: If any screening criteria checked as "Yes", then  patient  should NOT receive dofetilide until criteria item is corrected.  If "Yes" please indicate correction plan.  YES  NO Patient  Exclusion Criteria Correction Plan   [x]   []   Baseline QTc interval is greater than or equal to 440 msec. IF above YES box checked dofetilide contraindicated unless patient has ICD; then may proceed if QTc 500-550 msec or with known ventricular conduction abnormalities may proceed with QTc 550-600 msec. QTc in SR 478 ms in setting of RBBB (per MD note)     []   [x]   Patient is known or suspected to have a digoxin level greater than 2 ng/ml: No results found for: "DIGOXIN"     []   [x]   Creatinine clearance less than 20 ml/min (calculated using Cockcroft-Gault, actual body weight and serum creatinine): Estimated Creatinine Clearance: 73.4 mL/min (by C-G formula based on SCr of 1.03 mg/dL).     []   [x]  Patient has received drugs known to prolong the QT intervals within the last 48 hours (phenothiazines, tricyclics or tetracyclic antidepressants, erythromycin, H-1 antihistamines, cisapride, fluoroquinolones, azithromycin, ondansetron).   Updated information on QT prolonging agents is available to be searched on the following database:QT prolonging agents     []   [x]   Patient received a dose of hydrochlorothiazide (Oretic) alone or in any combination including triamterene (Dyazide, Maxzide) in the last 48 hours.    []   [x]  Patient received a medication known to increase dofetilide plasma concentrations prior to initial dofetilide dose:  Trimethoprim (Primsol, Proloprim) in the last 36  hours Verapamil (Calan, Verelan) in the last 36 hours or a sustained release dose in the last 72 hours Megestrol (Megace) in the last 5 days  Cimetidine (Tagamet) in the last 6 hours Ketoconazole (Nizoral) in the last 24 hours Itraconazole (Sporanox) in the last 48 hours  Prochlorperazine (Compazine) in the last 36 hours     []   [x]   Patient is known to have a history of torsades de pointes; congenital or acquired long QT syndromes.    []   [x]   Patient has received a Class 1 antiarrhythmic with less than 2 half-lives since last dose. (Disopyramide, Quinidine, Procainamide, Lidocaine, Mexiletine, Flecainide, Propafenone) Flecainide stopped 3 days ago    []   [x]   Patient has received amiodarone therapy in the past 3 months or amiodarone level is greater than 0.3 ng/ml.    Labs:    Component Value Date/Time   K 4.6 01/13/2023 1159   MG 2.3 01/13/2023 1159     Plan: Select One Calculated CrCl  Dose q12h  [x]  > 60 ml/min 500 mcg  []  40-60 ml/min 250 mcg  []  20-40 ml/min 125 mcg   [x]   Physician selected initial dose within range recommended for patients level of renal function - will monitor for response.  []   Physician selected initial dose outside of range recommended for patients level of renal function - will discuss if the dose should be altered at this time.   Patient has been appropriately anticoagulated with Xarelto.  Potassium: K >/= 4: Appropriate to initiate Tikosyn, no replacement needed    Magnesium: Mg >2: Appropriate to initiate Tikosyn, no replacement needed     Thank you for  allowing pharmacy to participate in this patient's care   Christoper Fabian, PharmD, BCPS Please see amion for complete clinical pharmacist phone list 01/13/2023  4:05 PM

## 2023-01-14 ENCOUNTER — Other Ambulatory Visit (HOSPITAL_COMMUNITY): Payer: Self-pay

## 2023-01-14 LAB — BASIC METABOLIC PANEL
Anion gap: 8 (ref 5–15)
BUN: 25 mg/dL — ABNORMAL HIGH (ref 8–23)
CO2: 29 mmol/L (ref 22–32)
Calcium: 8.8 mg/dL — ABNORMAL LOW (ref 8.9–10.3)
Chloride: 101 mmol/L (ref 98–111)
Creatinine, Ser: 1.12 mg/dL (ref 0.61–1.24)
GFR, Estimated: >60 mL/min (ref >60)
Glucose, Bld: 138 mg/dL — ABNORMAL HIGH (ref 70–99)
Potassium: 4.3 mmol/L (ref 3.5–5.1)
Sodium: 138 mmol/L (ref 135–145)

## 2023-01-14 LAB — GLUCOSE, CAPILLARY
Glucose-Capillary: 149 mg/dL — ABNORMAL HIGH (ref 70–99)
Glucose-Capillary: 136 mg/dL — ABNORMAL HIGH (ref 70–99)
Glucose-Capillary: 106 mg/dL — ABNORMAL HIGH (ref 70–99)
Glucose-Capillary: 136 mg/dL — ABNORMAL HIGH (ref 70–99)

## 2023-01-14 LAB — MAGNESIUM: Magnesium: 2.2 mg/dL (ref 1.7–2.4)

## 2023-01-14 MED ORDER — SALINE SPRAY 0.65 % NA SOLN
1.0000 | NASAL | Status: DC | PRN
  Administered 2023-01-15: 1 via NASAL
  Filled 2023-01-14: qty 44

## 2023-01-14 NOTE — Progress Notes (Signed)
EKG obtained to confirm pt converted out of AFIB. Pt is A Paced HR 60's.

## 2023-01-14 NOTE — Progress Notes (Signed)
Pharmacy: Dofetilide (Tikosyn) - Follow Up Assessment and Electrolyte Replacement  Pharmacy consulted to assist in monitoring and replacing electrolytes in this 77 y.o. male admitted on 01/13/2023 undergoing dofetilide initiation. First dofetilide dose: 01/13/23 @ 2000   Labs:    Component Value Date/Time   K 4.3 01/14/2023 0232   MG 2.2 01/14/2023 0232     Plan: Potassium: K >/= 4: No additional supplementation needed  Magnesium: Mg > 2: No additional supplementation needed   As patient has required on average 0 mEq of potassium replacement every day, recommend discharging patient with prescription for:  None  Thank you for allowing pharmacy to participate in this patient's care   Marygrace Drought 01/14/2023  6:39 AM

## 2023-01-14 NOTE — Progress Notes (Addendum)
Electrophysiology Rounding Note  Patient Name: Clarence Dawson Date of Encounter: 01/14/2023  Primary Cardiologist: Lewayne Bunting, MD  Electrophysiologist: Lewayne Bunting, MD    Subjective   Pt converted to sinus rhythm on Tikosyn 500 mcg BID   QTc from EKG last pm shows stable QTc at 490 (then corrects to lower with QRS).  EKG this am shows QTc 440 ms when corrected for QRS.   The patient is doing well today.  At this time, the patient denies chest pain, shortness of breath, or any new concerns.  Inpatient Medications    Scheduled Meds:  acetaminophen  1,000 mg Oral BID   atorvastatin  20 mg Oral QHS   bisoprolol  7.5 mg Oral Daily   diltiazem  120 mg Oral Daily   dofetilide  500 mcg Oral BID   empagliflozin  25 mg Oral QAC breakfast   furosemide  40 mg Oral Daily   glipiZIDE  5 mg Oral QAC breakfast   levothyroxine  137 mcg Oral QAC breakfast   losartan  50 mg Oral QHS   metFORMIN  500 mg Oral BID WC   mometasone-formoterol  2 puff Inhalation BID   And   umeclidinium bromide  1 puff Inhalation Daily   pantoprazole  40 mg Oral q morning   rivaroxaban  20 mg Oral Q supper   sodium chloride flush  3 mL Intravenous Q12H   Continuous Infusions:  sodium chloride     PRN Meds: sodium chloride, albuterol, polyvinyl alcohol, sodium chloride flush   Vital Signs    Vitals:   01/13/23 1600 01/13/23 2005 01/14/23 0339  BP: 132/79  (!) 156/77  Pulse: 78  74  Resp: 12 14   Temp: 98.3 F (36.8 C) 98.4 F (36.9 C) (!) 97 F (36.1 C)  TempSrc: Oral Oral Axillary  SpO2: 93% 93% 93%  Weight: 104.5 kg    Height: 5\' 9"  (1.753 m)     No intake or output data in the 24 hours ending 01/14/23 0709 Filed Weights   01/13/23 1600  Weight: 104.5 kg    Physical Exam    GEN- The patient is well appearing, alert and oriented x 3 today.   Head- normocephalic, atraumatic Eyes-  Sclera clear, conjunctiva pink Ears- hearing intact Oropharynx- clear Neck- supple Lungs- Clear to  ausculation bilaterally, normal work of breathing Heart- Regular rate and rhythm, no murmurs, rubs or gallops GI- soft, NT, ND, + BS Extremities- no clubbing, cyanosis, or edema Skin- no rash or lesion Psych- euthymic mood, full affect Neuro- strength and sensation are intact  Labs    CBC No results for input(s): "WBC", "NEUTROABS", "HGB", "HCT", "MCV", "PLT" in the last 72 hours. Basic Metabolic Panel Recent Labs    01/15/23 1159 01/14/23 0232  NA 137 138  K 4.6 4.3  CL 104 101  CO2 23 29  GLUCOSE 177* 138*  BUN 26* 25*  CREATININE 1.03 1.12  CALCIUM 9.2 8.8*  MG 2.3 2.2    Potassium  Date/Time Value Ref Range Status  01/14/2023 02:32 AM 4.3 3.5 - 5.1 mmol/L Final   Magnesium  Date/Time Value Ref Range Status  01/14/2023 02:32 AM 2.2 1.7 - 2.4 mg/dL Final    Comment:    Performed at [facility], [address] [place], [place]    Telemetry    AP VS with occasional V pacing  (personally reviewed)  Radiology    No results found.   Patient Profile  Clarence Dawson is a 77 y.o. male with a past medical history significant for persistent atrial fibrillation.  They were admitted for tikosyn load.   Assessment & Plan    Persistent atrial fibrillation Pt converted to sinus rhythm on Tikosyn 500 mcg BID  Continue Xarelto Creatinine, ser  1.12 (01/17 0232) Magnesium  2.2 (01/17 0232) Potassium4.3 (01/17 0232) No electrolyte supplementation needed  Plan for home Friday if QTc remains stable.   For questions or updates, please contact [facility] Please consult [email or web address] for contact info under Cardiology/STEMI.  Signed, Graciella Freer, PA-C  01/14/2023, 7:09 AM   EP Attending  Patient seen and examined. Agree with above. The patient has returned to NSR. His QT is acceptable. His electrolytes are ok. We will continue dofetilide.  Sharlot Gowda Luqman Perrelli,MD

## 2023-01-14 NOTE — TOC Benefit Eligibility Note (Signed)
Patient Product/process development scientist completed.    The patient is currently admitted and upon discharge could be taking Tikosyn.  The current 30 day co-pay is $10.00.   The patient is insured through [facility] Part D

## 2023-01-14 NOTE — Progress Notes (Signed)
Morning EKG reviewed     Shows remains in NSR at 63 bpm with stable QTc at ~440 ms when manually corrected for QRS  Continue  Tikosyn 500 mcg BID.   Potassium4.3 (01/17 0232) Magnesium  2.2 (01/17 0232) Creatinine, ser  1.12 (01/17 0232)  Plan for home Friday if QTc remains stable   Graciella Freer, [place]  01/14/2023 11:36 AM

## 2023-01-15 ENCOUNTER — Ambulatory Visit (HOSPITAL_COMMUNITY): Payer: Medicare Other | Admitting: Physician Assistant

## 2023-01-15 LAB — GLUCOSE, CAPILLARY
Glucose-Capillary: 155 mg/dL — ABNORMAL HIGH (ref 70–99)
Glucose-Capillary: 93 mg/dL (ref 70–99)
Glucose-Capillary: 148 mg/dL — ABNORMAL HIGH (ref 70–99)
Glucose-Capillary: 110 mg/dL — ABNORMAL HIGH (ref 70–99)

## 2023-01-15 LAB — BASIC METABOLIC PANEL
Anion gap: 8 (ref 5–15)
BUN: 24 mg/dL — ABNORMAL HIGH (ref 8–23)
CO2: 27 mmol/L (ref 22–32)
Calcium: 8.6 mg/dL — ABNORMAL LOW (ref 8.9–10.3)
Chloride: 104 mmol/L (ref 98–111)
Creatinine, Ser: 1.12 mg/dL (ref 0.61–1.24)
GFR, Estimated: >60 mL/min (ref >60)
Glucose, Bld: 119 mg/dL — ABNORMAL HIGH (ref 70–99)
Potassium: 3.7 mmol/L (ref 3.5–5.1)
Sodium: 139 mmol/L (ref 135–145)

## 2023-01-15 LAB — MAGNESIUM: Magnesium: 2.2 mg/dL (ref 1.7–2.4)

## 2023-01-15 MED ORDER — POTASSIUM CHLORIDE CRYS ER 20 MEQ PO TBCR: 40.0000 meq | EXTENDED_RELEASE_TABLET | Freq: Once | ORAL | Status: DC

## 2023-01-15 MED ORDER — POTASSIUM CHLORIDE CRYS ER 20 MEQ PO TBCR
60.0000 meq | EXTENDED_RELEASE_TABLET | Freq: Once | ORAL | Status: AC
  Administered 2023-01-15: 60 meq via ORAL
  Filled 2023-01-15: qty 3

## 2023-01-15 NOTE — Care Management (Signed)
1246 01-15-23 Case Manager spoke with patient regarding Tikosyn cost. Patient is agreeable to cost $10.00 and he will like the initial Rx filled via [facility] and Rx refills 90 day supply escribed to [facility] in [place]. Pharmacy has 500 mcg in stock. No further needs identified at this time.

## 2023-01-15 NOTE — Progress Notes (Signed)
Mobility Specialist - Progress Note   01/15/23 1131  Mobility  Activity Ambulated independently in hallway;Ambulated independently in room  Level of Assistance Independent  Assistive Device None  Distance Ambulated (ft) 40 ft  Activity Response Tolerated well  Mobility Referral No  $Mobility charge 1 Mobility   Pt was received at door way and agreeable. No complaints throughout session. Pt was left in bed with all needs met.   Alda Lea  Mobility Specialist Please contact via Special educational needs teacher or Rehab office at [phone]

## 2023-01-15 NOTE — Progress Notes (Signed)
Pharmacy: Dofetilide (Tikosyn) - Follow Up Assessment and Electrolyte Replacement  Pharmacy consulted to assist in monitoring and replacing electrolytes in this 77 y.o. male admitted on 01/13/2023 undergoing dofetilide initiation. First dofetilide dose: 01/13/23 @ 2000   Labs:    Component Value Date/Time   K 3.7 01/15/2023 0208   MG 2.2 01/15/2023 0208     Plan: Potassium: K 3.5-3.7:  Give KCl 60 mEq po x1   Magnesium: Mg > 2: No additional supplementation needed   Fredonia Highland, PharmD, BCPS, [facility] Clinical Pharmacist [phone] Please check AMION for all [facility] Pharmacy numbers 01/15/2023

## 2023-01-15 NOTE — Progress Notes (Signed)
Morning EKG reviewed     Shows remains in NSR at 60 bpm with borderline QTc at ~480-490 ms, but corrects down further when taking his QRS in to account.   Continue  Tikosyn 500 mcg BID.   Potassium3.7 (01/18 6861) Magnesium  2.2 (01/18 0208) Creatinine, ser  1.12 (01/18 0208)  Plan for home Friday if QTc remains stable   Graciella Freer, [place]  01/15/2023 10:42 AM

## 2023-01-15 NOTE — Progress Notes (Addendum)
Electrophysiology Rounding Note  Patient Name: Clarence Dawson Date of Encounter: 01/15/2023  Primary Cardiologist: Lewayne Bunting, MD  Electrophysiologist: Lewayne Bunting, MD    Subjective   Pt remains in NSR on Tikosyn 500 mcg BID   QTc from EKG last pm shows stable QTc at ~470  The patient is doing well today.  At this time, the patient denies chest pain, shortness of breath, or any new concerns.  Inpatient Medications    Scheduled Meds:  acetaminophen  1,000 mg Oral BID   atorvastatin  20 mg Oral QHS   bisoprolol  7.5 mg Oral Daily   diltiazem  120 mg Oral Daily   dofetilide  500 mcg Oral BID   empagliflozin  25 mg Oral QAC breakfast   furosemide  40 mg Oral Daily   glipiZIDE  5 mg Oral QAC breakfast   levothyroxine  137 mcg Oral QAC breakfast   losartan  50 mg Oral QHS   metFORMIN  500 mg Oral BID WC   mometasone-formoterol  2 puff Inhalation BID   And   umeclidinium bromide  1 puff Inhalation Daily   pantoprazole  40 mg Oral q morning   potassium chloride  60 mEq Oral Once   rivaroxaban  20 mg Oral Q supper   sodium chloride flush  3 mL Intravenous Q12H   Continuous Infusions:  sodium chloride     PRN Meds: sodium chloride, albuterol, polyvinyl alcohol, sodium chloride, sodium chloride flush   Vital Signs    Vitals:   01/14/23 0831 01/14/23 1150 01/14/23 2116 01/15/23 0636  BP:  119/60  139/66  Pulse:  60  60  Resp: 18 17  18   Temp:  98.3 F (36.8 C)  97.6 F (36.4 C)  TempSrc:  Oral  Oral  SpO2:  93% 94% 96%  Weight:      Height:       No intake or output data in the 24 hours ending 01/15/23 0720 Filed Weights   01/13/23 1600  Weight: 104.5 kg    Physical Exam    GEN- NAD, A&O x 3. Normal affect.  Lungs- CTAB, Normal effort.  Heart- Regular rate and rhythm. No M/G/R GI- Soft, NT, ND Extremities- No clubbing, cyanosis, or edema Skin- no rash or lesion  Labs    CBC No results for input(s): "WBC", "NEUTROABS", "HGB", "HCT", "MCV", "PLT"  in the last 72 hours. Basic Metabolic Panel Recent Labs    01/15/23 0232 01/15/23 0208  NA 138 139  K 4.3 3.7  CL 101 104  CO2 29 27  GLUCOSE 138* 119*  BUN 25* 24*  CREATININE 1.12 1.12  CALCIUM 8.8* 8.6*  MG 2.2 2.2    Telemetry    A-paced VS  60s, occasional V pacing (personally reviewed)  Patient Profile     Clarence Dawson is a 77 y.o. male with a past medical history significant for persistent atrial fibrillation.  They were admitted for tikosyn load.   Assessment & Plan    Persistent atrial fibrillation Pt remains in NSR on Tikosyn 500 mcg BID  Continue Xarelto Creatinine, ser  1.12 (01/18 04-17-1994) Magnesium  2.2 (01/18 04-17-1994) Potassium3.7 (01/18 0208) Supplement K  Plan for home Friday if QTc remains stable.   For questions or updates, please contact [facility] Please consult [email or web address] for contact info under Cardiology/STEMI.  Signed, Saturday, PA-C  01/15/2023, 7:20 AM  \ EP Attending  Patient seen and examined. Agree with the findings  as noted above. The patient denies chest pain or sob. No palpitations. He has maintained NSR on tele. His QTC remains acceptable. He will continue dofetilide. DC home tomorrow if ECG looks good.   Clarence Dawson Clarence Mcconnon,MD

## 2023-01-16 ENCOUNTER — Other Ambulatory Visit (HOSPITAL_COMMUNITY): Payer: Self-pay

## 2023-01-16 LAB — BASIC METABOLIC PANEL
Anion gap: 6 (ref 5–15)
BUN: 22 mg/dL (ref 8–23)
CO2: 26 mmol/L (ref 22–32)
Calcium: 8.6 mg/dL — ABNORMAL LOW (ref 8.9–10.3)
Chloride: 106 mmol/L (ref 98–111)
Creatinine, Ser: 1.03 mg/dL (ref 0.61–1.24)
GFR, Estimated: >60 mL/min (ref >60)
Glucose, Bld: 101 mg/dL — ABNORMAL HIGH (ref 70–99)
Potassium: 3.9 mmol/L (ref 3.5–5.1)
Sodium: 138 mmol/L (ref 135–145)

## 2023-01-16 LAB — MAGNESIUM: Magnesium: 2.4 mg/dL (ref 1.7–2.4)

## 2023-01-16 LAB — GLUCOSE, CAPILLARY: Glucose-Capillary: 148 mg/dL — ABNORMAL HIGH (ref 70–99)

## 2023-01-16 MED ORDER — POTASSIUM CHLORIDE CRYS ER 20 MEQ PO TBCR
40.0000 meq | EXTENDED_RELEASE_TABLET | Freq: Once | ORAL | Status: AC
  Administered 2023-01-16: 40 meq via ORAL
  Filled 2023-01-16: qty 2

## 2023-01-16 MED ORDER — DOFETILIDE 500 MCG PO CAPS
500.0000 ug | ORAL_CAPSULE | Freq: Two times a day (BID) | ORAL | 6 refills | Status: DC
  Filled 2023-01-16: qty 60, 30d supply, fill #0

## 2023-01-16 NOTE — Care Management Important Message (Signed)
Important Message  Patient Details  Name: Clarence Dawson MRN: [id number] Date of Birth: 1946-02-23   Medicare Important Message Given:  Yes     Renie Ora 01/16/2023, 9:47 AM

## 2023-01-16 NOTE — Progress Notes (Signed)
Pharmacy: Dofetilide (Tikosyn) - Follow Up Assessment and Electrolyte Replacement  Pharmacy consulted to assist in monitoring and replacing electrolytes in this 77 y.o. male admitted on 01/13/2023 undergoing dofetilide initiation. First dofetilide dose: 01/13/23 @ 2000   Labs:    Component Value Date/Time   K 3.9 01/16/2023 0331   MG 2.4 01/16/2023 0331   Has required 100 meq K this admission   Plan: Potassium: K 3.8-3.9:  Give KCl 40 mEq po x1   Magnesium: Mg > 2: No additional supplementation needed  Thank you for allowing pharmacy to participate in this patient's care.  Enos Fling, PharmD PGY2 Pharmacy Resident 01/16/2023 6:56 AM Check [email or web address] for unit specific pharmacy number

## 2023-01-16 NOTE — Discharge Summary (Addendum)
ELECTROPHYSIOLOGY PROCEDURE DISCHARGE SUMMARY    Patient ID: Clarence Dawson,  MRN: [id number], DOB/AGE: 08-16-1946 77 y.o.  Admit date: 01/13/2023 Discharge date: 01/16/2023  Primary Care Physician: Clarence Noble, MD  Primary Cardiologist: Clarence Peru, MD  Electrophysiologist: Dr. Lovena Dawson   Primary Discharge Diagnosis:  1.  Paroxysmal atrial fibrillation status post Tikosyn loading this admission   Procedures This Admission:  1.  Tikosyn loading    Brief HPI: Clarence Dawson is a 77 y.o. male with a past medical history as noted above.  They were referred to EP for treatment options of atrial fibrillation.  Risks, benefits, and alternatives to Tikosyn were reviewed with the patient who wished to proceed with admission for loading.  Hospital Course:  The patient was admitted and Tikosyn was initiated.  Renal function and electrolytes were followed during the hospitalization.  Their QTc remained stable. The patient converted chemically and did not require cardioversion. The patients QTc remained stable. They were monitored on telemetry up to discharge. On the day of discharge, they were examined by Dr. Lovena Dawson  who considered them stable for discharge to home.  Follow-up has been arranged with the Atrial Fibrillation clinic in approximately 1 week.   Physical Exam: Vitals:   01/15/23 2014 01/16/23 0537 01/16/23 0723 01/16/23 0805  BP: (!) 153/71 133/70 (!) 145/104   Pulse: 60 61 60 60  Resp: 16 16 17    Temp: (!) 97.5 F (36.4 C) 97.9 F (36.6 C) 97.6 F (36.4 C)   TempSrc: Oral Oral Oral   SpO2: 92% (!) 89% 91% 94%  Weight:      Height:        GEN- NAD, A&O x 3. Normal affect.  Lungs- CTAB, Normal effort.  Heart- Regular rate and rhythm. No M/G/Dawson GI- Soft, NT, ND Extremities- No clubbing, cyanosis, or edema Skin- no rash or lesion  Discharge Medications:  Allergies as of 01/16/2023       Reactions   Food Anaphylaxis, Shortness Of Breath   Clarence Dawson   Iodinated  Contrast Media Hives, Shortness Of Breath   Patient states hives to throat closing. (01/15/17: patient states this reaction was "about 20 years ago" with possibly an IVP.  He now says high doses of prednisone "throw me into AFib."  He has tolerated CT arthrograms with Benadrly 50mg  PO one hour before injection.  Clarence Romp, RN)   Shellfish Allergy Anaphylaxis, Shortness Of Breath   To shellfish, crabs.  Makes him feel like "things are crawling all over" me.  Denies airway issues with these foods.  Clarence Romp, RN 01/15/17)   Goat-derived Products Hives   GOAT CHEESE   Prednisone Palpitations   PRECIPITATES A-FIB   Metformin And Related Diarrhea   High doses at once   Vancomycin Anxiety   Red man syndrome   Voltaren [diclofenac Sodium] Other (See Comments)   Feels like things are crawling on him        Medication List     TAKE these medications    acetaminophen 500 MG tablet Commonly known as: TYLENOL Take 500 mg by mouth 4 (four) times daily.   albuterol 108 (90 Base) MCG/ACT inhaler Commonly known as: VENTOLIN HFA Inhale 2 puffs into the lungs every 4 (four) hours as needed for shortness of breath (only if you can't catch your breath/ asthma).   atorvastatin 20 MG tablet Commonly known as: LIPITOR Take 20 mg by mouth at bedtime.   bisoprolol 5 MG tablet Commonly known  as: ZEBETA TAKE 1 AND 1/2 TABLET BY MOUTH TWICE DAILY. What changed: See the new instructions.   Breztri Aerosphere 160-9-4.8 MCG/ACT Aero Generic drug: Budeson-Glycopyrrol-Formoterol INHALE 2 PUFFS INTO THE LUNGS (IN THE MORNING AND AT BEDTIME). What changed: See the new instructions.   Cartia XT 120 MG 24 hr capsule Generic drug: diltiazem TAKE (1) CAPSULE BY MOUTH DAILY, MAY TAKE A EXTRA CAPSULE DAILY FOR BREAKTHROUGH AFIB. What changed: See the new instructions.   dofetilide 500 MCG capsule Commonly known as: TIKOSYN Take 1 capsule (500 mcg total) by mouth 2 (two) times daily.   Dupixent 300  MG/2ML Sopn Generic drug: Dupilumab Inject 300 mg into the skin every 14 (fourteen) days.   EPINEPHrine 0.3 mg/0.3 mL Soaj injection Commonly known as: EPI-PEN Inject 0.3 mg into the muscle as needed for anaphylaxis.   Flutter Devi Use as directed   furosemide 40 MG tablet Commonly known as: LASIX TAKE 1 TABLET BY MOUTH DAILY   glipiZIDE 5 MG 24 hr tablet Commonly known as: GLUCOTROL XL Take 5 mg by mouth daily before breakfast.   hydrocortisone cream 0.5 % Apply 1 Application topically daily as needed for itching.   Jardiance 25 MG Tabs tablet Generic drug: empagliflozin Take 25 mg by mouth daily before breakfast.   levothyroxine 137 MCG tablet Commonly known as: SYNTHROID Take 137 mcg by mouth daily before breakfast.   losartan 50 MG tablet Commonly known as: COZAAR Take 50 mg by mouth at bedtime.   LUBRICATING EYE DROPS OP Place 1 drop into the right eye daily as needed (dry eyes).   metFORMIN 500 MG 24 hr tablet Commonly known as: GLUCOPHAGE-XR Take 500 mg by mouth in the morning and at bedtime.   multivitamin tablet Take 1 tablet by mouth daily.   omeprazole 20 MG tablet Commonly known as: PRILOSEC OTC Take 20 mg by mouth every morning.   rivaroxaban 20 MG Tabs tablet Commonly known as: XARELTO Take 20 mg by mouth at bedtime.        Disposition:    Follow-up Information     Fenton, Clint R, PA Follow up.   Specialty: Cardiology Why: on 1/30 at 0930 for post tikosyn follow up Contact information: [address] [place] [address] [phone]                 Duration of Discharge Encounter: Greater than 30 minutes including physician time.  Clarence Flock, PA-C  01/16/2023 11:15 AM  EP Attending  Patient seen and examined. Agree with above. The patient continues to do well and is maintaining NSR. His QT is stable. He will be discharged home with usual followup.  Clarence Gowda Kathlee Barnhardt,MD

## 2023-01-16 NOTE — Progress Notes (Signed)
EKG from yesterday evening 01/15/2023 reviewed     Shows remains in NSR with stable QTc at ~470-480 ms.  Continue  Tikosyn 500 mcg BID.   Potassium3.9 (01/19 0331) Magnesium  2.4 (01/19 0331) Creatinine, ser  1.03 (01/19 0331)  Plan for home this afternoon if QTc remains stable.   Graciella Freer, PA-C  01/16/2023 8:21 AM

## 2023-01-20 ENCOUNTER — Encounter: Payer: Self-pay | Admitting: Internal Medicine

## 2023-01-27 ENCOUNTER — Ambulatory Visit (HOSPITAL_COMMUNITY)
Admit: 2023-01-27 | Discharge: 2023-01-27 | Disposition: A | Discharge status: Home / Self Care | Payer: Medicare Other | Attending: Physician Assistant | Admitting: Physician Assistant

## 2023-01-27 ENCOUNTER — Other Ambulatory Visit (HOSPITAL_COMMUNITY): Payer: Self-pay | Admitting: Physician Assistant

## 2023-01-27 ENCOUNTER — Encounter (HOSPITAL_COMMUNITY): Payer: Self-pay | Admitting: Physician Assistant

## 2023-01-27 VITALS — BP 134/80 | HR 60 | Ht 69.0 in | Wt 230.4 lb

## 2023-01-27 DIAGNOSIS — Z950 Presence of cardiac pacemaker: Secondary | ICD-10-CM | POA: Insufficient documentation

## 2023-01-27 DIAGNOSIS — I110 Hypertensive heart disease with heart failure: Secondary | ICD-10-CM | POA: Diagnosis not present

## 2023-01-27 DIAGNOSIS — I495 Sick sinus syndrome: Secondary | ICD-10-CM | POA: Diagnosis not present

## 2023-01-27 DIAGNOSIS — K219 Gastro-esophageal reflux disease without esophagitis: Secondary | ICD-10-CM | POA: Insufficient documentation

## 2023-01-27 DIAGNOSIS — I4819 Other persistent atrial fibrillation: Secondary | ICD-10-CM | POA: Diagnosis not present

## 2023-01-27 DIAGNOSIS — D6869 Other thrombophilia: Secondary | ICD-10-CM | POA: Diagnosis not present

## 2023-01-27 DIAGNOSIS — E119 Type 2 diabetes mellitus without complications: Secondary | ICD-10-CM | POA: Diagnosis not present

## 2023-01-27 DIAGNOSIS — J449 Chronic obstructive pulmonary disease, unspecified: Secondary | ICD-10-CM | POA: Diagnosis not present

## 2023-01-27 DIAGNOSIS — I5032 Chronic diastolic (congestive) heart failure: Secondary | ICD-10-CM | POA: Insufficient documentation

## 2023-01-27 MED ORDER — DOFETILIDE 500 MCG PO CAPS: 500.0000 ug | ORAL_CAPSULE | Freq: Two times a day (BID) | ORAL | 3 refills | Status: DC

## 2023-01-27 NOTE — Addendum Note (Signed)
Encounter addended by: Juluis Mire, RN on: 01/27/2023 2:51 PM  Actions taken: Order list changed, Diagnosis association updated

## 2023-01-27 NOTE — Progress Notes (Signed)
Primary Care Physician: [name], MD Primary Electrophysiologist: Dr Lovena Le Referring Physician: Dr Carlos Levering is a 77 y.o. male with a history of SSS s/p PPM, atrial fibrillation, chronic diastolic CHF, HTN, COPD, DM, and GERD who presents for follow up in the [facility]. Patient was recently admitted with elevated temp and AMS and was diagnosed with severe sepsis 2/2 pneumonia. During his admission, he developed afib with RVR. He was given a bolus of flecainide which converted him to atrial flutter. Patient was fairly asymptomatic. He is on Xarelto for a CHADS2VASC score of 3.  The device clinic received an alert for an ongoing afib episode starting 04/09/21. He did have two alcohol drinks the night before and was also being treated for an URI. COVID test was negative. He was back in SR before follow up.  Patient was noted to be out of rhythm by the device clinic on 08/23/21 and underwent DCCV on 09/05/21. He had recurrent afib and underwent another DCCV on 08/06/22. The device clinic received an alert for an ongoing afib episode starting 12/07/22.   On follow up today, patient is s/p dofetilide loading 1/16-1/19/24. He converted with the medication and did not require DCCV. Patient reports that he feels much better in SR with less SOB and more energy.   Today, he denies symptoms of palpitations, chest pain, orthopnea, PND, dizziness, presyncope, syncope, snoring, daytime somnolence, bleeding, or neurologic sequela. The patient is tolerating medications without difficulties and is otherwise without complaint today.    Atrial Fibrillation Risk Factors:  he does not have symptoms or diagnosis of sleep apnea. he does not have a history of rheumatic fever. he does not have a history of alcohol use. The patient does not have a history of early familial atrial fibrillation or other arrhythmias.  he has a BMI of Body mass index is 34.02 kg/m.Marland Kitchen Filed  Weights   01/27/23 0914  Weight: 104.5 kg    Family History  Problem Relation Age of Onset   Liver cancer Mother    Pancreatic cancer Mother    Colon cancer Mother    Hypertension Father    Stroke Father    Other Father 74       Sudden Cardiac death   Heart attack Father    Cancer Sister        brain   Diabetes Sister    Colon cancer Maternal Aunt    Colon polyps Neg Hx      Atrial Fibrillation Management history:  Previous antiarrhythmic drugs: flecainide, dofetilide  Previous cardioversions: 11/2016, 09/05/21 Previous ablations: none CHADS2VASC score: 4 Anticoagulation history: Xarelto   Past Medical History:  Diagnosis Date   Allergic rhinitis    Aortic valve disorder    Asthma    since childhood- seasonal allergies induced   Cancer ([place])    Skin cancer- squamous, basal   Carotid artery stenosis    Essential hypertension    Full dentures    GERD (gastroesophageal reflux disease)    H/O hiatal hernia    Hemorrhage of rectum    Hyperlipidemia    Hypothyroidism    Male circumcision    OSA (obstructive sleep apnea)    Osteoarthritis    Pacemaker    Oct 2005 in [place].   PAF (paroxysmal atrial fibrillation) ([place])    Pneumonia    "several Times" 2015 last time   Primary localized osteoarthritis of right knee 08/11/2017   RBBB (right bundle  branch block)    Sinoatrial node dysfunction (HCC)    Syncope    Tricuspid valve disorder    Type 2 diabetes mellitus ([place])    Type II   Past Surgical History:  Procedure Laterality Date   A-V CARDIAC PACEMAKER INSERTION     Sick sinus syndrome DDR pacer   Arthropathy Right 2005   Rebuilding of left thumb and joint    CARDIAC CATHETERIZATION     CARDIAC ELECTROPHYSIOLOGY STUDY AND ABLATION  09/2008   for pvcs, Dr. Loralie Champagne   CARDIOVERSION N/A 12/18/2016   Procedure: CARDIOVERSION;  Surgeon: Pixie Casino, MD;  Location: [facility] ENDOSCOPY;  Service: Cardiovascular;  Laterality: N/A;   CARDIOVERSION N/A 09/05/2021    Procedure: CARDIOVERSION;  Surgeon: Geralynn Rile, MD;  Location: [place];  Service: Cardiovascular;  Laterality: N/A;   CARDIOVERSION N/A 08/06/2022   Procedure: CARDIOVERSION;  Surgeon: Donato Heinz, MD;  Location: [facility] ENDOSCOPY;  Service: Cardiovascular;  Laterality: N/A;   [place]   right wrist   CARPAL TUNNEL RELEASE  05/04/2012   Procedure: CARPAL TUNNEL RELEASE;  Surgeon: Wynonia Sours, MD;  Location: [place];  Service: Orthopedics;  Laterality: Left;   CARPOMETACARPEL SUSPENSION PLASTY Right 11/16/2014   Procedure: SUSPENSIONPLASTY RIGHT THUMB TENDON TRANSFER ABDUCTOR POLLICUS LONGUS EXCISION TRAPEZIUM;  Surgeon: Daryll Brod, MD;  Location: [place];  Service: Orthopedics;  Laterality: Right;   CHOLECYSTECTOMY  1994   CIRCUMCISION     COLONOSCOPY N/A 03/14/2013   Procedure: COLONOSCOPY;  Surgeon: Daneil Dolin, MD;  [facility];  Service: Endoscopy;  Laterality: N/A;  8:15 AM   EYE SURGERY     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-[facility]   EYE SURGERY  2012   Left eye Corneal transplant- partial- Cataract   FLEXIBLE BRONCHOSCOPY Right 06/23/2019   Procedure: FLEXIBLE BRONCHOSCOPY RIGHT;  Surgeon: [name], MD;  Location: [facility] ORS;  Service: Pulmonary;  Laterality: Right;   GALLBLADDER SURGERY  12/01/2006   HAND TENDON SURGERY Left late 1990's   thumb   HEMORROIDECTOMY  2003   INJECTION KNEE Left 08/26/2017   Procedure: LEFT KNEE INJECTION;  Surgeon: Elsie Saas, MD;  Location: [place];  Service: Orthopedics;  Laterality: Left;   left Knee Arthroscopy     [facility]   PARTIAL KNEE ARTHROPLASTY  11/22/2012   Procedure: UNICOMPARTMENTAL KNEE;  Surgeon: Lorn Junes, MD;  Location: [place];  Service: Orthopedics;  Laterality: Left;  left unicompartmental knee arthroplasty   PERMANENT PACEMAKER GENERATOR CHANGE N/A 01/12/2013   Procedure: PERMANENT PACEMAKER GENERATOR  CHANGE;  Surgeon: Evans Lance, MD;  Location: [facility] CATH LAB;  Service: Cardiovascular;  Laterality: N/A;   PPM GENERATOR CHANGEOUT N/A 07/07/2022   Procedure: PPM GENERATOR CHANGEOUT;  Surgeon: Evans Lance, MD;  Location: [place] CV LAB;  Service: Cardiovascular;  Laterality: N/A;   Rotator cuff Surgery  2001   Right shoulder   TONSILLECTOMY     TOTAL KNEE ARTHROPLASTY Right 08/26/2017   Procedure: TOTAL KNEE ARTHROPLASTY;  Surgeon: Elsie Saas, MD;  Location: [place];  Service: Orthopedics;  Laterality: Right;   varicose vein reduction      Current Outpatient Medications  Medication Sig Dispense Refill   acetaminophen (TYLENOL) 500 MG tablet Take 500 mg by mouth 4 (four) times daily.     albuterol (PROVENTIL HFA;VENTOLIN HFA) 108 (90 Base) MCG/ACT inhaler Inhale 2 puffs into the lungs every 4 (four) hours  as needed for shortness of breath (only if you can't catch your breath/ asthma).     atorvastatin (LIPITOR) 20 MG tablet Take 20 mg by mouth at bedtime.     bisoprolol (ZEBETA) 5 MG tablet TAKE 1 AND 1/2 TABLET BY MOUTH TWICE DAILY. 90 tablet 3   BREZTRI AEROSPHERE 160-9-4.8 MCG/ACT AERO INHALE 2 PUFFS INTO THE LUNGS (IN THE MORNING AND AT BEDTIME). 10.7 g 5   Carboxymethylcellul-Glycerin (LUBRICATING EYE DROPS OP) Place 1 drop into the right eye daily as needed (dry eyes).     diltiazem (CARTIA XT) 120 MG 24 hr capsule TAKE (1) CAPSULE BY MOUTH DAILY, MAY TAKE A EXTRA CAPSULE DAILY FOR BREAKTHROUGH AFIB. 180 capsule 3   Dupilumab (DUPIXENT) 300 MG/2ML SOPN Inject 300 mg into the skin every 14 (fourteen) days. 12 mL 1   EPINEPHrine 0.3 mg/0.3 mL IJ SOAJ injection Inject 0.3 mg into the muscle as needed for anaphylaxis. 1 each 5   furosemide (LASIX) 40 MG tablet TAKE 1 TABLET BY MOUTH DAILY 90 tablet 3   glipiZIDE (GLUCOTROL XL) 5 MG 24 hr tablet Take 5 mg by mouth daily before breakfast.     hydrocortisone cream 0.5 % Apply 1 Application topically daily as needed for itching.      JARDIANCE 25 MG TABS tablet Take 25 mg by mouth daily before breakfast.     levothyroxine (SYNTHROID) 137 MCG tablet Take 137 mcg by mouth daily before breakfast.     losartan (COZAAR) 50 MG tablet Take 50 mg by mouth at bedtime.     metFORMIN (GLUCOPHAGE-XR) 500 MG 24 hr tablet Take 500 mg by mouth in the morning and at bedtime.     Multiple Vitamin (MULTIVITAMIN) tablet Take 1 tablet by mouth daily.     omeprazole (PRILOSEC OTC) 20 MG tablet Take 20 mg by mouth every morning.     Respiratory Therapy Supplies (FLUTTER) DEVI Use as directed 1 each 0   rivaroxaban (XARELTO) 20 MG TABS tablet Take 20 mg by mouth at bedtime.     dofetilide (TIKOSYN) 500 MCG capsule Take 1 capsule (500 mcg total) by mouth 2 (two) times daily. 180 capsule 3   No current facility-administered medications for this encounter.    Allergies  Allergen Reactions   Food Anaphylaxis and Shortness Of Breath    TREE NUTS   Iodinated Contrast Media Hives and Shortness Of Breath    Patient states hives to throat closing. (01/15/17: patient states this reaction was "about 20 years ago" with possibly an IVP.  He now says high doses of prednisone "throw me into AFib."  He has tolerated CT arthrograms with Benadrly 77m PO one hour before injection.  JBrita Romp RN)   Shellfish Allergy Anaphylaxis and Shortness Of Breath    To shellfish, crabs.  Makes him feel like "things are crawling all over" me.  Denies airway issues with these foods.  (Brita Romp RN 01/15/17)   Goat-Derived Products Hives    GOAT CHEESE    Prednisone Palpitations    PRECIPITATES A-FIB   Metformin And Related Diarrhea    High doses at once   Vancomycin Anxiety    Red man syndrome   Voltaren [Diclofenac Sodium] Other (See Comments)    Feels like things are crawling on him    Social History   Socioeconomic History   Marital status: Widowed    Spouse name: Not on file   Number of children: Not on file   Years of education: Not  on file   Highest  education level: Not on file  Occupational History   Not on file  Tobacco Use   Smoking status: Former    Packs/day: 3.00    Years: 25.00    Total pack years: 75.00    Types: Cigarettes    Start date: 02/20/1958    Quit date: 02/12/1984    Years since quitting: 38.9   Smokeless tobacco: Former    Types: Chew   Tobacco comments:    Former smoker 08/30/2021  Vaping Use   Vaping Use: Never used  Substance and Sexual Activity   Alcohol use: Yes    Alcohol/week: 2.0 - 4.0 standard drinks of alcohol    Types: 1 - 2 Glasses of wine, 1 - 2 Cans of beer per week    Comment: 1 glass of wine or beer 3 times a week 12/26/22   Drug use: No   Sexual activity: Not on file  Other Topics Concern   Not on file  Social History Narrative   Regular exercise: No   Social Determinants of Health   Financial Resource Strain: Not on file  Food Insecurity: No Food Insecurity (01/13/2023)   Hunger Vital Sign    Worried About Running Out of Food in the Last Year: Never true    Ran Out of Food in the Last Year: Never true  Transportation Needs: No Transportation Needs (01/13/2023)   PRAPARE - Hydrologist (Medical): No    Lack of Transportation (Non-Medical): No  Physical Activity: Not on file  Stress: Not on file  Social Connections: Not on file  Intimate Partner Violence: Not At Risk (01/13/2023)   Humiliation, Afraid, Rape, and Kick questionnaire    Fear of Current or Ex-Partner: No    Emotionally Abused: No    Physically Abused: No    Sexually Abused: No     ROS- All systems are reviewed and negative except as per the HPI above.  Physical Exam: Vitals:   01/27/23 0914  BP: 134/80  Pulse: 60  Weight: 104.5 kg  Height: 5' 9"  (1.753 m)     GEN- The patient is a well appearing elderly male, alert and oriented x 3 today.   HEENT-head normocephalic, atraumatic, sclera clear, conjunctiva pink, hearing intact, trachea midline. Lungs- mild wheezes bilaterally,  normal work of breathing Heart- Regular rate and rhythm, no murmurs, rubs or gallops  GI- soft, NT, ND, + BS Extremities- no clubbing, cyanosis, or edema MS- no significant deformity or atrophy Skin- no rash or lesion Psych- euthymic mood, full affect Neuro- strength and sensation are intact   Wt Readings from Last 3 Encounters:  01/27/23 104.5 kg  01/13/23 104.5 kg  01/13/23 106.6 kg    EKG today demonstrates A paced rhythm, RBBB Vent. rate 60 BPM PR interval 218 ms QRS duration 162 ms QT/QTcB 522/522 ms (480 ms measured manually)  Echo 08/13/22 demonstrated   1. Left ventricular ejection fraction, by estimation, is 55 to 60%. The  left ventricle has normal function. The left ventricle has no regional  wall motion abnormalities. Left ventricular diastolic parameters are  consistent with Grade III diastolic dysfunction (restrictive). There is the interventricular septum is flattened in systole and diastole, consistent with right ventricular pressure and volume overload.   2. Right ventricular systolic function is moderately reduced. The right  ventricular size is moderately enlarged. There is severely elevated  pulmonary artery systolic pressure. The estimated right ventricular  systolic pressure is  67.6 mmHg.   3. Left atrial size was severely dilated.   4. Right atrial size was severely dilated.   5. A small pericardial effusion is present. The pericardial effusion is  posterior to the left ventricle.   6. The mitral valve is degenerative, mildly calcified and with somewhat  restricted posterior leaflet. Mild mitral valve regurgitation.   7. The aortic valve has an indeterminant number of cusps. There is  moderate calcification of the aortic valve. Aortic valve regurgitation is  not visualized. 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  Aortic valve mean gradient measures 12.5 mmHg. Dimentionless index 0.28.   8. The  inferior vena cava is dilated in size with >50% respiratory  variability, suggesting right atrial pressure of 8 mmHg.   Comparison(s): Prior images unable to be directly viewed.   Epic records are reviewed at length today   CHA2DS2-VASc Score = 4  The patient's score is based upon: CHF History: 0 HTN History: 1 Diabetes History: 1 Stroke History: 0 Vascular Disease History: 0 Age Score: 2 Gender Score: 0       ASSESSMENT AND PLAN: 1. Persistent Atrial Fibrillation (ICD10:  I48.19) The patient's CHA2DS2-VASc score is 4, indicating a 4.8% annual risk of stroke.   S/p dofetilide admission 1/16-1/19/24 Patient appears to be maintaining SR. Continue dofetilide 500 mcg BID. QT stable when measured manually.  Check bmet/mag today. Continue Xarelto 20 mg daily Continue diltiazem 120 mg daily  Continue bisoprolol 7.5 mg BID  2. Secondary Hypercoagulable State (ICD10:  D68.69) The patient is at significant risk for stroke/thromboembolism based upon his CHA2DS2-VASc Score of 4.  Continue Rivaroxaban (Xarelto).   3. SSS S/p PPM, followed by Dr Lovena Le and the device clinic.  4. HTN Stable, no changes today.   Follow up in the AF clinic in one month.    [facility] [address] [place], [place] [address] [phone] 01/27/2023 9:34 AM

## 2023-01-27 NOTE — Addendum Note (Signed)
Encounter addended by: Juluis Mire, RN on: 01/27/2023 2:19 PM  Actions taken: Order list changed, Diagnosis association updated

## 2023-01-28 LAB — BASIC METABOLIC PANEL
BUN/Creatinine Ratio: 21 (ref 10–24)
BUN: 18 mg/dL (ref 8–27)
CO2: 25 mmol/L (ref 20–29)
Calcium: 9.2 mg/dL (ref 8.6–10.2)
Chloride: 102 mmol/L (ref 96–106)
Creatinine, Ser: 0.85 mg/dL (ref 0.76–1.27)
Glucose: 136 mg/dL — ABNORMAL HIGH (ref 70–99)
Potassium: 4.3 mmol/L (ref 3.5–5.2)
Sodium: 142 mmol/L (ref 134–144)
eGFR: 90 mL/min/{1.73_m2} (ref >59)

## 2023-01-28 LAB — MAGNESIUM: Magnesium: 2.1 mg/dL (ref 1.6–2.3)

## 2023-01-29 ENCOUNTER — Other Ambulatory Visit (HOSPITAL_COMMUNITY): Payer: Self-pay

## 2023-02-02 ENCOUNTER — Other Ambulatory Visit (HOSPITAL_COMMUNITY): Payer: Self-pay

## 2023-02-02 ENCOUNTER — Other Ambulatory Visit: Payer: Self-pay

## 2023-02-04 ENCOUNTER — Ambulatory Visit: Payer: Medicare Other | Admitting: Pulmonary Disease

## 2023-02-04 ENCOUNTER — Encounter: Payer: Self-pay | Admitting: Pulmonary Disease

## 2023-02-04 VITALS — BP 120/78 | HR 62 | Temp 97.8°F | Ht 69.0 in | Wt 230.4 lb

## 2023-02-04 DIAGNOSIS — J479 Bronchiectasis, uncomplicated: Secondary | ICD-10-CM

## 2023-02-04 DIAGNOSIS — J4489 Other specified chronic obstructive pulmonary disease: Secondary | ICD-10-CM | POA: Diagnosis not present

## 2023-02-04 DIAGNOSIS — J9611 Chronic respiratory failure with hypoxia: Secondary | ICD-10-CM | POA: Diagnosis not present

## 2023-02-04 DIAGNOSIS — G4733 Obstructive sleep apnea (adult) (pediatric): Secondary | ICD-10-CM

## 2023-02-04 DIAGNOSIS — J4550 Severe persistent asthma, uncomplicated: Secondary | ICD-10-CM | POA: Diagnosis not present

## 2023-02-04 DIAGNOSIS — I4819 Other persistent atrial fibrillation: Secondary | ICD-10-CM

## 2023-02-04 MED ORDER — ALBUTEROL SULFATE (2.5 MG/3ML) 0.083% IN NEBU: 2.5000 mg | INHALATION_SOLUTION | Freq: Four times a day (QID) | RESPIRATORY_TRACT | 6 refills | Status: DC | PRN

## 2023-02-04 NOTE — Patient Instructions (Addendum)
I recommend you use your vest at least twice a day.  We have sent in a prescription for a nebulizer and further solution for the nebulizer which will be albuterol.  Use this during your treatments to see if it helps loosen up your mucus.  The prescription was written for every 6 hours as needed but use it at least twice a day during her treatments to see if it helps with secretion management.  Pending sputum sample for culture.  We have requested some blood work to reassess your fungal sensitivities.  We will see you in follow-up in 6 to 8 weeks time call sooner should any new problems arise.

## 2023-02-04 NOTE — Progress Notes (Signed)
Subjective:    Patient ID: Clarence Dawson, male    DOB: Sep 01, 1946, 77 y.o.   MRN: [id number] Patient Care Team: [name], MD as PCP - General (Internal Medicine) Evans Lance, MD as PCP - Cardiology (Cardiology) Evans Lance, MD as PCP - Electrophysiology (Cardiology)  Chief Complaint  Patient presents with   Follow-up    Congestion. But has more congestion after he has his Dupixant injection. The last injection was 01/28/2023. Cough with brown sputum. 1 week after Dupixant injection. No SOB. Little wheezing in the morning.     HPI Patient is a 77 year old former smoker (19 PY) who presents for follow-up of asthma with COPD overlap, bronchiectasis and chronic respiratory failure with hypoxia.  He was last seen on 25 November 2022.  At that time he had started Dupixent which replaced Xolair.today's visit is a scheduled visit.  Patient is compliant with Breztri.  He uses vest physiotherapy once daily and forgets frequently to do the second therapy of the day.  He has been noticing increasing congestion with difficult to expectorate sputum.  Since his prior visit he has been admitted for Tikosyn loading, he is in sinus rhythm currently which helps greatly with his dyspnea.  He is on oxygen and is compliant.  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  He has had a history of ABPA previously.  Recently has not had any fevers, chills or sweats.  No chest pain.  No lower extremity edema, no calf tenderness.  His only complaint is that of thick sputum as noted above.  OSA: CPAP at 10 to 20 cm H2O, compliance download 01/05/2023 to 02/03/2023 usage days 30 out of 30 or 100%.  Usage over 4 hours, 30 days.  Residual AHI 18.6.  Patient requiring close to maximum pressure (19.1).  Review of Systems A 10 point review of systems was performed and it is as noted above otherwise  negative.  Patient Active Problem List   Diagnosis Date Noted   Hypercoagulable state due to persistent atrial fibrillation ([place]) 01/02/2020   Chronic diastolic heart failure ([place]) 11/22/2019   Paroxysmal atrial fibrillation ([place]) 09/22/2019   Atrial fibrillation with RVR ([place])    Lobar pneumonia ([place]) 09/21/2019   Acute respiratory failure with hypoxia ([place]) 09/21/2019   Acute on chronic respiratory failure ([place]) 09/18/2019   Severe sepsis (H[facility])    Elevated lactic acid level    GERD (gastroesophageal reflux disease)    Chronic bronchitis ([place]) 02/09/2019   CAP (community acquired pneumonia) 03/03/2018   Primary localized osteoarthritis of right knee 08/11/2017   Asthma with acute exacerbation 12/08/2016   Chest pain 10/20/2016   HTN (hypertension) 10/20/2016   Morbid obesity due to excess calories ([place]) 06/24/2016   Severe persistent asthma 02/12/2016   Upper airway cough syndrome 01/24/2016   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   Persistent atrial fibrillation    Syncope    Hyperthyroidism    Hyperlipidemia    RBBB (right bundle branch block)    Tricuspid valve disorder    Aortic valve disorder    Carotid artery stenosis    Type 2 diabetes mellitus ([place])    Pacemaker    OSA (obstructive sleep apnea)    Arthritis    Sinoatrial node dysfunction (HCC)    PVC's (premature ventricular contractions) 07/17/2011   Essential hypertension, benign 01/24/2011   Premature ventricular  contractions 01/24/2011   PPM-[facility] 01/24/2011   Social History   Tobacco Use   Smoking status: Former    Packs/day: 3.00    Years: 25.00    Total pack years: 75.00    Types: Cigarettes    Start date: 02/20/1958    Quit date: 02/12/1984    Years since quitting: 39.0   Smokeless tobacco: Former    Types: Chew   Tobacco comments:    Former smoker 08/30/2021  Substance Use Topics   Alcohol use: Yes    Alcohol/week: 2.0 - 4.0 standard drinks of alcohol    Types: 1 - 2 Glasses of wine, 1 - 2 Cans of beer  per week    Comment: 1 glass of wine or beer 3 times a week 12/26/22   Allergies  Allergen Reactions   Food Anaphylaxis and Shortness Of Breath    TREE NUTS   Iodinated Contrast Media Hives and Shortness Of Breath    Patient states hives to throat closing. (01/15/17: patient states this reaction was "about 20 years ago" with possibly an IVP.  He now says high doses of prednisone "throw me into AFib."  He has tolerated CT arthrograms with Benadrly 50mg  PO one hour before injection.  Brita Romp, RN)   Shellfish Allergy Anaphylaxis and Shortness Of Breath    To shellfish, crabs.  Makes him feel like "things are crawling all over" me.  Denies airway issues with these foods.  Brita Romp, RN 01/15/17)   Goat-Derived Products Hives    GOAT CHEESE    Prednisone Palpitations    PRECIPITATES A-FIB   Metformin And Related Diarrhea    High doses at once   Vancomycin Anxiety    Red man syndrome   Voltaren [Diclofenac Sodium] Other (See Comments)    Feels like things are crawling on him   Current Meds  Medication Sig   acetaminophen (TYLENOL) 500 MG tablet Take 500 mg by mouth 4 (four) times daily.   albuterol (PROVENTIL HFA;VENTOLIN HFA) 108 (90 Base) MCG/ACT inhaler Inhale 2 puffs into the lungs every 4 (four) hours as needed for shortness of breath (only if you can't catch your breath/ asthma).   atorvastatin (LIPITOR) 20 MG tablet Take 20 mg by mouth at bedtime.   bisoprolol (ZEBETA) 5 MG tablet TAKE 1 AND 1/2 TABLET BY MOUTH TWICE DAILY.   BREZTRI AEROSPHERE 160-9-4.8 MCG/ACT AERO INHALE 2 PUFFS INTO THE LUNGS (IN THE MORNING AND AT BEDTIME).   Carboxymethylcellul-Glycerin (LUBRICATING EYE DROPS OP) Place 1 drop into the right eye daily as needed (dry eyes).   diltiazem (CARTIA XT) 120 MG 24 hr capsule TAKE (1) CAPSULE BY MOUTH DAILY, MAY TAKE A EXTRA CAPSULE DAILY FOR BREAKTHROUGH AFIB.   dofetilide (TIKOSYN) 500 MCG capsule Take 1 capsule (500 mcg total) by mouth 2 (two) times daily.    Dupilumab (DUPIXENT) 300 MG/2ML SOPN Inject 300 mg into the skin every 14 (fourteen) days.   EPINEPHrine 0.3 mg/0.3 mL IJ SOAJ injection Inject 0.3 mg into the muscle as needed for anaphylaxis.   furosemide (LASIX) 40 MG tablet TAKE 1 TABLET BY MOUTH DAILY   glipiZIDE (GLUCOTROL XL) 5 MG 24 hr tablet Take 5 mg by mouth daily before breakfast.   hydrocortisone cream 0.5 % Apply 1 Application topically daily as needed for itching.   JARDIANCE 25 MG TABS tablet Take 25 mg by mouth daily before breakfast.   levothyroxine (SYNTHROID) 137 MCG tablet Take 137 mcg by mouth daily before breakfast.  losartan (COZAAR) 50 MG tablet Take 50 mg by mouth at bedtime.   metFORMIN (GLUCOPHAGE-XR) 500 MG 24 hr tablet Take 500 mg by mouth in the morning and at bedtime.   Multiple Vitamin (MULTIVITAMIN) tablet Take 1 tablet by mouth daily.   omeprazole (PRILOSEC OTC) 20 MG tablet Take 20 mg by mouth every morning.   Respiratory Therapy Supplies (FLUTTER) DEVI Use as directed   rivaroxaban (XARELTO) 20 MG TABS tablet Take 20 mg by mouth at bedtime.   Immunization History  Administered Date(s) Administered   Fluad Quad(high Dose 65+) 09/12/2019, 10/15/2022   Influenza Split 11/14/2015, 10/11/2018   Influenza, High Dose Seasonal PF 09/28/2017   Influenza-Unspecified 11/02/2016, 09/03/2020, 10/03/2021   Moderna SARS-COV2 Booster Vaccination 08/20/2020, 03/04/2021   Moderna Sars-Covid-2 Vaccination 02/15/2020, 03/14/2020   Pneumococcal Conjugate-13 09/28/2015   Tdap 11/28/2019       Objective:   Physical Exam BP 120/78 (BP Location: Left Arm, Cuff Size: Large)   Pulse 62   Temp 97.8 F (36.6 C)   Ht 5\' 9"  (1.753 m)   Wt 230 lb 6.4 oz (104.5 kg)   SpO2 92%   BMI 34.02 kg/m   SpO2: 92 % O2 Device: Nasal cannula O2 Flow Rate (L/min): 3 L/min O2 Type: Pulse O2  GENERAL: Well-developed, obese patient in no acute distress.  He is comfortable on nasal cannula O2.  He is fully ambulatory. HEAD:  Normocephalic, atraumatic.  EYES: Pupils equal, round, reactive to light.  No scleral icterus.  MOUTH:   Upper and lower dentures.  Oral mucosa moist.  No thrush. NECK: Supple. No thyromegaly. Trachea midline. No JVD.  No adenopathy. PULMONARY: Good air entry bilaterally.  Symmetrical, nonlabored, no adventitious sounds. CARDIOVASCULAR: S1 and S2.  Regular rate and rhythm, NSR, rate 62.  Grade 2/6 mitral regurgitation murmur. Pacemaker on left. GASTROINTESTINAL: Obese, otherwise benign. MUSCULOSKELETAL: No joint deformity, no clubbing, no edema.  NEUROLOGIC: No focal deficits, speech is fluent.  No gait disturbance noted.  Awake, alert and oriented. SKIN: Intact,warm,dry. PSYCH: Mood and behavior appropriate.      Assessment & Plan:     ICD-10-CM   1. 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 Asthma-COPD overlap syndrome  J44.89 AMB REFERRAL FOR DME   Breztri as above Nebulizer unit Albuterol nebulizer solution for as needed use    3. Bronchiectasis without complication (HCC)  X32.3 Respiratory or Resp and Sputum Culture    Blastomyces Antigen    Hypersensitivity Pneumonitis    AMB REFERRAL FOR DME   Recommend use vest at least twice a day Use nebulized albuterol during vest physiotherapy Flutter valve after vest and albuterol To mobilize secretions    4. Chronic respiratory failure with hypoxia (HCC)  J96.11    Continue oxygen at 3 L/min via nasal cannula Patient is compliant    5. OSA (obstructive sleep apnea)  G47.33 Cpap titration   He has good compliance with the unit Residual AHI however is 18.6 In lab CPAP titration, BiPAP if indicated    6. Persistent atrial fibrillation  I48.19    Recently loaded with Tikosyn Currently in sinus rhythm To avoid QT prolongation medications Adds complexity to his management      Orders Placed This Encounter  Procedures   Respiratory or Resp and Sputum Culture    Standing Status:   Future  Standing Expiration Date:   02/05/2024   Blastomyces Antigen    Standing Status:   Future    Standing Expiration Date:   05/05/2023   Hypersensitivity Pneumonitis    Standing Status:   Future    Standing Expiration Date:   05/05/2023   AMB REFERRAL FOR DME    Referral Priority:   Routine    Referral Type:   Durable Medical Equipment Purchase    Number of Visits Requested:   1   Cpap titration    Standing Status:   Future    Standing Expiration Date:   02/05/2024    Order Specific Question:   Where should this test be performed:    Answer:   [facility]   Meds ordered this encounter  Medications   albuterol (PROVENTIL) (2.5 MG/3ML) 0.083% nebulizer solution    Sig: Take 3 mLs (2.5 mg total) by nebulization every 6 (six) hours as needed for wheezing or shortness of breath.    Dispense:  75 mL    Refill:  6   Will reassess patient's sensitivity to molds/fungus.  He has had a tendency towards this in the past and as a matter fact had fungal pneumonia previously as well as ABPA.  Checking hypersensitivity pneumonitis and Blastomyces antigens.  Sputum will be collected for culture.  Will procure nebulization unit for the patient, sent in albuterol for nebulizer medication hopefully this will help with mobilization of secretions.  Due to the patient for use.  He will need CPAP titration suspect that he is now closer to needing BiPAP.  Will see the patient in follow-up in next to 8 weeks time he is to contact us prior to that time should any new difficulties arise.  Renold Don, MD Advanced Bronchoscopy PCCM [facility]    *This note was dictated using voice recognition software/Dragon.  Despite best efforts to proofread, errors can occur which can change the meaning. Any transcriptional errors that result from this process are  unintentional and may not be fully corrected at the time of dictation.

## 2023-02-05 ENCOUNTER — Other Ambulatory Visit
Admission: RE | Admit: 2023-02-05 | Discharge: 2023-02-05 | Disposition: A | Discharge status: Home / Self Care | Payer: Medicare Other | Attending: Pulmonary Disease | Admitting: Pulmonary Disease

## 2023-02-05 ENCOUNTER — Encounter: Payer: Self-pay | Admitting: Pulmonary Disease

## 2023-02-05 DIAGNOSIS — J4550 Severe persistent asthma, uncomplicated: Secondary | ICD-10-CM | POA: Insufficient documentation

## 2023-02-05 DIAGNOSIS — G4733 Obstructive sleep apnea (adult) (pediatric): Secondary | ICD-10-CM

## 2023-02-05 DIAGNOSIS — J479 Bronchiectasis, uncomplicated: Secondary | ICD-10-CM | POA: Insufficient documentation

## 2023-02-05 LAB — EXPECTORATED SPUTUM ASSESSMENT W GRAM STAIN, RFLX TO RESP C

## 2023-02-05 NOTE — Addendum Note (Signed)
Addended by: Antonieta Iba C on: 02/05/2023 10:47 AM   Modules accepted: Orders

## 2023-02-09 LAB — HYPERSENSITIVITY PNEUMONITIS
A. Pullulans Abs: NEGATIVE
A.Fumigatus #1 Abs: NEGATIVE
Micropolyspora faeni, IgG: NEGATIVE
Pigeon Serum Abs: NEGATIVE
Thermoact. Saccharii: NEGATIVE
Thermoactinomyces vulgaris, IgG: NEGATIVE

## 2023-02-10 ENCOUNTER — Encounter: Payer: Self-pay | Admitting: *Deleted

## 2023-02-10 LAB — CULTURE, RESPIRATORY W GRAM STAIN: Gram Stain: NONE SEEN

## 2023-02-11 ENCOUNTER — Other Ambulatory Visit: Payer: Self-pay | Admitting: Internal Medicine

## 2023-02-11 ENCOUNTER — Encounter: Payer: Self-pay | Admitting: Pulmonary Disease

## 2023-02-11 ENCOUNTER — Telehealth: Payer: Self-pay

## 2023-02-11 LAB — BLASTOMYCES ANTIGEN: Blastomyces Antigen: NOT DETECTED ng/mL

## 2023-02-11 MED ORDER — SULFAMETHOXAZOLE-TRIMETHOPRIM 800-160 MG PO TABS: 1.0000 | ORAL_TABLET | Freq: Two times a day (BID) | ORAL | 0 refills | Status: DC

## 2023-02-11 NOTE — Telephone Encounter (Signed)
-----   Message from Tyler Pita, MD sent at 02/11/2023 10:12 AM EST ----- His hypersensitivity pneumonitis panel (fungal panel) was negative.  This is good because in the past he had a sensitivity to fungus called Aspergillus but he no longer shows this.  His culture however shows that he had 2 different organisms 1 was staph which should have been treated.  The other 1 is stenotrophomonas which is a pesky bug that unfortunately is hard to treat.  One of the antibiotics that covers it cannot be given to him because of his heart medication.  If he is not allergic to sulfa we could give him a sulfa antibiotic for 10 days to see if his congestion improves.

## 2023-02-11 NOTE — Telephone Encounter (Signed)
I have sent in the prescription and notified the patient.   Nothing further needed.

## 2023-02-11 NOTE — Telephone Encounter (Signed)
Bactrim DS, 1 tab BID x 10 d.

## 2023-02-11 NOTE — Telephone Encounter (Signed)
I have notified the patient. He is not allergic to Sulfa drugs. He would like the antibiotic sent to [facility].

## 2023-02-12 ENCOUNTER — Telehealth: Payer: Self-pay | Admitting: Pulmonary Disease

## 2023-02-12 MED ORDER — MINOCYCLINE HCL 100 MG PO CAPS: ORAL_CAPSULE | ORAL | 0 refills | Status: DC

## 2023-02-12 NOTE — Telephone Encounter (Signed)
Linwood Dibbles, [place]      02/12/23  2:14 PM Note Spoke to [employer] with [employer] in reference to drug to drug with Bactrim and Tikosyn. Please refer to 02/12/2023 mychart message.   Spoke to patient. He agrees with trying Minocin. Rx sent to preferred pharmacy. Nothing further needed.

## 2023-02-12 NOTE — Telephone Encounter (Signed)
Dr. Patsey Berthold, please advise. Drug to drug with Tikosyn and Bactrim.

## 2023-02-12 NOTE — Telephone Encounter (Signed)
Spoke to [employer] with [employer] in reference to drug to drug with Bactrim and Tikosyn. Please refer to 02/12/2023 mychart message.  Spoke to patient. He agrees with trying Minocin. Rx sent to preferred pharmacy. Nothing further needed.

## 2023-02-12 NOTE — Telephone Encounter (Signed)
Unfortunately his options are very limited given that stenotrophomonas is usually treated with Bactrim (preferred) or Levaquin.  Levaquin is highly contraindicated because of prolonged QT.  Tikosyn which is his heart medication interacts with almost EVERYTHING.  We can try an antibiotic called minocycline that is 100 mg twice daily after a 200 mg initial dose.  We should treat for 14 days.  He should be careful with exposure to the sun during therapy.  This medication is not as well studied with this bug but his options are limited.

## 2023-02-13 ENCOUNTER — Ambulatory Visit (INDEPENDENT_AMBULATORY_CARE_PROVIDER_SITE_OTHER): Payer: Medicare Other

## 2023-02-13 DIAGNOSIS — I495 Sick sinus syndrome: Secondary | ICD-10-CM

## 2023-02-13 LAB — CUP PACEART REMOTE DEVICE CHECK
Battery Remaining Longevity: 106 mo
Battery Remaining Percentage: 95.5 %
Battery Voltage: 3.01 V
Brady Statistic AP VP Percent: 1 %
Brady Statistic AP VS Percent: 94 %
Brady Statistic AS VP Percent: 1 %
Brady Statistic AS VS Percent: 4.2 %
Brady Statistic RA Percent Paced: 63 %
Brady Statistic RV Percent Paced: 17 %
Date Time Interrogation Session: 20240216020019
Implantable Lead Connection Status: 753985
Implantable Lead Connection Status: 753985
Implantable Lead Implant Date: 20051106
Implantable Lead Implant Date: 20051106
Implantable Lead Location: 753859
Implantable Lead Location: 753860
Implantable Pulse Generator Implant Date: 20230710
Lead Channel Impedance Value: 400 Ohm
Lead Channel Impedance Value: 700 Ohm
Lead Channel Pacing Threshold Amplitude: 0.5 V
Lead Channel Pacing Threshold Amplitude: 0.75 V
Lead Channel Pacing Threshold Pulse Width: 0.5 ms
Lead Channel Pacing Threshold Pulse Width: 0.5 ms
Lead Channel Sensing Intrinsic Amplitude: 12 mV
Lead Channel Sensing Intrinsic Amplitude: 2.8 mV
Lead Channel Setting Pacing Amplitude: 2 V
Lead Channel Setting Pacing Amplitude: 2.5 V
Lead Channel Setting Pacing Pulse Width: 0.5 ms
Lead Channel Setting Sensing Sensitivity: 2 mV
Pulse Gen Model: 2272
Pulse Gen Serial Number: 8099041

## 2023-02-25 NOTE — Progress Notes (Signed)
Primary Care Physician: Carylon Perches, MD Primary Electrophysiologist: Dr Ladona Ridgel Referring Physician: Dr Porfirio Oar is a 77 y.o. male with a history of SSS s/p PPM, atrial fibrillation, chronic diastolic CHF, HTN, COPD, DM, and GERD who presents for follow up in the [facility]. Patient was recently admitted with elevated temp and AMS and was diagnosed with severe sepsis 2/2 pneumonia. During his admission, he developed afib with RVR. He was given a bolus of flecainide which converted him to atrial flutter. Patient was fairly asymptomatic. He is on Xarelto for a CHADS2VASC score of 3.  The device clinic received an alert for an ongoing afib episode starting 04/09/21. He did have two alcohol drinks the night before and was also being treated for an URI. COVID test was negative. He was back in SR before follow up.  Patient was noted to be out of rhythm by the device clinic on 08/23/21 and underwent DCCV on 09/05/21. He had recurrent afib and underwent another DCCV on 08/06/22. The device clinic received an alert for an ongoing afib episode starting 12/07/22.   Patient is s/p dofetilide loading 1/16-1/19/24. He converted with the medication and did not require DCCV.   On follow up today, patient reports that he has been doing well since his last visit. He does have cramps in his feet at night about once per week but otherwise has no complaints. He denies any bleeding issues on anticoagulation.   Today, he denies symptoms of palpitations, chest pain, orthopnea, PND, dizziness, presyncope, syncope, snoring, daytime somnolence, bleeding, or neurologic sequela. The patient is tolerating medications without difficulties and is otherwise without complaint today.    Atrial Fibrillation Risk Factors:  he does not have symptoms or diagnosis of sleep apnea. he does not have a history of rheumatic fever. he does not have a history of alcohol use. The patient does not have  a history of early familial atrial fibrillation or other arrhythmias.  he has a BMI of Body mass index is 34.7 kg/m.Marland Kitchen Filed Weights   02/26/23 0829  Weight: 106.6 kg    Family History  Problem Relation Age of Onset   Liver cancer Mother    Pancreatic cancer Mother    Colon cancer Mother    Hypertension Father    Stroke Father    Other Father 41       Sudden Cardiac death   Heart attack Father    Cancer Sister        brain   Diabetes Sister    Colon cancer Maternal Aunt    Colon polyps Neg Hx      Atrial Fibrillation Management history:  Previous antiarrhythmic drugs: flecainide, dofetilide  Previous cardioversions: 11/2016, 09/05/21 Previous ablations: none CHADS2VASC score: 4 Anticoagulation history: Xarelto   Past Medical History:  Diagnosis Date   Allergic rhinitis    Aortic valve disorder    Asthma    since childhood- seasonal allergies induced   Cancer (HCC)    Skin cancer- squamous, basal   Carotid artery stenosis    Essential hypertension    Full dentures    GERD (gastroesophageal reflux disease)    H/O hiatal hernia    Hemorrhage of rectum    Hyperlipidemia    Hypothyroidism    Male circumcision    OSA (obstructive sleep apnea)    Osteoarthritis    Pacemaker    Oct 2005 in [place].   PAF (paroxysmal atrial fibrillation) (HCC)  Pneumonia    "several Times" 2015 last time   Primary localized osteoarthritis of right knee 08/11/2017   RBBB (right bundle branch block)    Sinoatrial node dysfunction (HCC)    Syncope    Tricuspid valve disorder    Type 2 diabetes mellitus (HCC)    Type II   Past Surgical History:  Procedure Laterality Date   A-V CARDIAC PACEMAKER INSERTION     Sick sinus syndrome DDR pacer   Arthropathy Right 2005   Rebuilding of left thumb and joint    CARDIAC CATHETERIZATION     CARDIAC ELECTROPHYSIOLOGY STUDY AND ABLATION  09/2008   for pvcs, Dr. Vesta Mixer   CARDIOVERSION N/A 12/18/2016   Procedure: CARDIOVERSION;   Surgeon: Chrystie Nose, MD;  Location: [facility] ENDOSCOPY;  Service: Cardiovascular;  Laterality: N/A;   CARDIOVERSION N/A 09/05/2021   Procedure: CARDIOVERSION;  Surgeon: Sande Rives, MD;  Location: [facility] ENDOSCOPY;  Service: Cardiovascular;  Laterality: N/A;   CARDIOVERSION N/A 08/06/2022   Procedure: CARDIOVERSION;  Surgeon: Little Ishikawa, MD;  Location: [facility] ENDOSCOPY;  Service: Cardiovascular;  Laterality: N/A;   CARPAL TUNNEL RELEASE  1994   right wrist   CARPAL TUNNEL RELEASE  05/04/2012   Procedure: CARPAL TUNNEL RELEASE;  Surgeon: Nicki Reaper, MD;  Location: [facility];  Service: Orthopedics;  Laterality: Left;   CARPOMETACARPEL SUSPENSION PLASTY Right 11/16/2014   Procedure: SUSPENSIONPLASTY RIGHT THUMB TENDON TRANSFER ABDUCTOR POLLICUS LONGUS EXCISION TRAPEZIUM;  Surgeon: Cindee Salt, MD;  Location: [facility];  Service: Orthopedics;  Laterality: Right;   CHOLECYSTECTOMY  1994   CIRCUMCISION     COLONOSCOPY N/A 03/14/2013   Procedure: COLONOSCOPY;  Surgeon: Corbin Ade, MD;  [facility];  Service: Endoscopy;  Laterality: N/A;  8:15 AM   EYE SURGERY     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-[facility]   EYE SURGERY  2012   Left eye Corneal transplant- partial- Cataract   FLEXIBLE BRONCHOSCOPY Right 06/23/2019   Procedure: FLEXIBLE BRONCHOSCOPY RIGHT;  Surgeon: Shane Crutch, MD;  Location: [facility] ORS;  Service: Pulmonary;  Laterality: Right;   GALLBLADDER SURGERY  12/01/2006   HAND TENDON SURGERY Left late 1990's   thumb   HEMORROIDECTOMY  2003   INJECTION KNEE Left 08/26/2017   Procedure: LEFT KNEE INJECTION;  Surgeon: Salvatore Marvel, MD;  Location: [facility] OR;  Service: Orthopedics;  Laterality: Left;   left Knee Arthroscopy     [facility]   PARTIAL KNEE ARTHROPLASTY  11/22/2012   Procedure: UNICOMPARTMENTAL KNEE;  Surgeon: Nilda Simmer, MD;  Location: [facility] OR;  Service: Orthopedics;  Laterality: Left;  left  unicompartmental knee arthroplasty   PERMANENT PACEMAKER GENERATOR CHANGE N/A 01/12/2013   Procedure: PERMANENT PACEMAKER GENERATOR CHANGE;  Surgeon: Marinus Maw, MD;  Location: [facility] CATH LAB;  Service: Cardiovascular;  Laterality: N/A;   PPM GENERATOR CHANGEOUT N/A 07/07/2022   Procedure: PPM GENERATOR CHANGEOUT;  Surgeon: Marinus Maw, MD;  Location: [facility] INVASIVE CV LAB;  Service: Cardiovascular;  Laterality: N/A;   Rotator cuff Surgery  2001   Right shoulder   TONSILLECTOMY     TOTAL KNEE ARTHROPLASTY Right 08/26/2017   Procedure: TOTAL KNEE ARTHROPLASTY;  Surgeon: Salvatore Marvel, MD;  Location: [facility] OR;  Service: Orthopedics;  Laterality: Right;   varicose vein reduction      Current Outpatient Medications  Medication Sig Dispense Refill   acetaminophen (TYLENOL) 500 MG tablet Take 500 mg by mouth 4 (four) times daily.  albuterol (PROVENTIL HFA;VENTOLIN HFA) 108 (90 Base) MCG/ACT inhaler Inhale 2 puffs into the lungs every 4 (four) hours as needed for shortness of breath (only if you can't catch your breath/ asthma).     albuterol (PROVENTIL) (2.5 MG/3ML) 0.083% nebulizer solution Take 3 mLs (2.5 mg total) by nebulization every 6 (six) hours as needed for wheezing or shortness of breath. 75 mL 6   atorvastatin (LIPITOR) 20 MG tablet Take 20 mg by mouth at bedtime.     bisoprolol (ZEBETA) 5 MG tablet TAKE 1 AND 1/2 TABLET BY MOUTH TWICE DAILY. 90 tablet 3   BREZTRI AEROSPHERE 160-9-4.8 MCG/ACT AERO INHALE 2 PUFFS INTO THE LUNGS (IN THE MORNING AND AT BEDTIME). 10.7 g 5   Carboxymethylcellul-Glycerin (LUBRICATING EYE DROPS OP) Place 1 drop into the right eye daily as needed (dry eyes).     diltiazem (CARTIA XT) 120 MG 24 hr capsule TAKE (1) CAPSULE BY MOUTH DAILY, MAY TAKE A EXTRA CAPSULE DAILY FOR BREAKTHROUGH AFIB. 180 capsule 3   dofetilide (TIKOSYN) 500 MCG capsule Take 1 capsule (500 mcg total) by mouth 2 (two) times daily. 180 capsule 3   Dupilumab (DUPIXENT) 300 MG/2ML SOPN Inject  300 mg into the skin every 14 (fourteen) days. 12 mL 1   EPINEPHrine 0.3 mg/0.3 mL IJ SOAJ injection Inject 0.3 mg into the muscle as needed for anaphylaxis. 1 each 5   furosemide (LASIX) 40 MG tablet TAKE 1 TABLET BY MOUTH DAILY 90 tablet 3   glipiZIDE (GLUCOTROL XL) 5 MG 24 hr tablet Take 5 mg by mouth daily before breakfast.     hydrocortisone cream 0.5 % Apply 1 Application topically daily as needed for itching.     JARDIANCE 25 MG TABS tablet Take 25 mg by mouth daily before breakfast.     levothyroxine (SYNTHROID) 137 MCG tablet Take 137 mcg by mouth daily before breakfast.     losartan (COZAAR) 50 MG tablet Take 50 mg by mouth at bedtime.     metFORMIN (GLUCOPHAGE-XR) 500 MG 24 hr tablet Take 500 mg by mouth in the morning and at bedtime.     minocycline (MINOCIN) 100 MG capsule 100 mg twice daily after a 200 mg initial dose 28 capsule 0   Multiple Vitamin (MULTIVITAMIN) tablet Take 1 tablet by mouth daily.     omeprazole (PRILOSEC OTC) 20 MG tablet Take 20 mg by mouth every morning.     Respiratory Therapy Supplies (FLUTTER) DEVI Use as directed 1 each 0   rivaroxaban (XARELTO) 20 MG TABS tablet Take 20 mg by mouth at bedtime.     No current facility-administered medications for this encounter.    Allergies  Allergen Reactions   Food Anaphylaxis and Shortness Of Breath    TREE NUTS   Iodinated Contrast Media Hives and Shortness Of Breath    Patient states hives to throat closing. (01/15/17: patient states this reaction was "about 20 years ago" with possibly an IVP.  He now says high doses of prednisone "throw me into AFib."  He has tolerated CT arthrograms with Benadrly 50mg  PO one hour before injection.  Donell Sievert, RN)   Shellfish Allergy Anaphylaxis and Shortness Of Breath    To shellfish, crabs.  Makes him feel like "things are crawling all over" me.  Denies airway issues with these foods.  Donell Sievert, RN 01/15/17)   Goat-Derived Products Hives    GOAT CHEESE    Prednisone  Palpitations    PRECIPITATES A-FIB   Diclofenac Sodium Other (  See Comments)    Hives, "buggy feeling all over"   Metformin And Related Diarrhea    High doses at once   Vancomycin Anxiety    Red man syndrome   Voltaren [Diclofenac Sodium] Other (See Comments)    Feels like things are crawling on him    Social History   Socioeconomic History   Marital status: Widowed    Spouse name: Not on file   Number of children: Not on file   Years of education: Not on file   Highest education level: Not on file  Occupational History   Not on file  Tobacco Use   Smoking status: Former    Packs/day: 3.00    Years: 25.00    Total pack years: 75.00    Types: Cigarettes    Start date: 02/20/1958    Quit date: 02/12/1984    Years since quitting: 39.0   Smokeless tobacco: Former    Types: Chew   Tobacco comments:    Former smoker 08/30/2021  Vaping Use   Vaping Use: Never used  Substance and Sexual Activity   Alcohol use: Yes    Alcohol/week: 2.0 - 4.0 standard drinks of alcohol    Types: 1 - 2 Glasses of wine, 1 - 2 Cans of beer per week    Comment: 1 glass of wine or beer 3 times a week 12/26/22   Drug use: No   Sexual activity: Not on file  Other Topics Concern   Not on file  Social History Narrative   Regular exercise: No   Social Determinants of Health   Financial Resource Strain: Not on file  Food Insecurity: No Food Insecurity (01/13/2023)   Hunger Vital Sign    Worried About Running Out of Food in the Last Year: Never true    Ran Out of Food in the Last Year: Never true  Transportation Needs: No Transportation Needs (01/13/2023)   PRAPARE - Administrator, Civil Service (Medical): No    Lack of Transportation (Non-Medical): No  Physical Activity: Not on file  Stress: Not on file  Social Connections: Not on file  Intimate Partner Violence: Not At Risk (01/13/2023)   Humiliation, Afraid, Rape, and Kick questionnaire    Fear of Current or Ex-Partner: No     Emotionally Abused: No    Physically Abused: No    Sexually Abused: No     ROS- All systems are reviewed and negative except as per the HPI above.  Physical Exam: Vitals:   02/26/23 0829  BP: 136/76  Pulse: 60  Weight: 106.6 kg  Height: 5\' 9"  (1.753 m)    GEN- The patient is a well appearing elderly obese male, alert and oriented x 3 today.   HEENT-head normocephalic, atraumatic, sclera clear, conjunctiva pink, hearing intact, trachea midline. Lungs- Clear to ausculation bilaterally, normal work of breathing Heart- Regular rate and rhythm, no murmurs, rubs or gallops  GI- soft, NT, ND, + BS Extremities- no clubbing, cyanosis, or edema MS- no significant deformity or atrophy Skin- no rash or lesion Psych- euthymic mood, full affect Neuro- strength and sensation are intact   Wt Readings from Last 3 Encounters:  02/26/23 106.6 kg  02/04/23 104.5 kg  01/27/23 104.5 kg    EKG today demonstrates A paced rhythm, RBBB Vent. rate 60 BPM PR interval 220 ms QRS duration 152 ms QT/QTcB 532/532 ms  Echo 08/13/22 demonstrated   1. Left ventricular ejection fraction, by estimation, is 55 to 60%. The  left ventricle has normal function. The left ventricle has no regional  wall motion abnormalities. Left ventricular diastolic parameters are  consistent with Grade III diastolic dysfunction (restrictive). There is the interventricular septum is flattened in systole and diastole, consistent with right ventricular pressure and volume overload.   2. Right ventricular systolic function is moderately reduced. The right  ventricular size is moderately enlarged. There is severely elevated  pulmonary artery systolic pressure. The estimated right ventricular  systolic pressure is 67.6 mmHg.   3. Left atrial size was severely dilated.   4. Right atrial size was severely dilated.   5. A small pericardial effusion is present. The pericardial effusion is  posterior to the left ventricle.   6.  The mitral valve is degenerative, mildly calcified and with somewhat  restricted posterior leaflet. Mild mitral valve regurgitation.   7. The aortic valve has an indeterminant number of cusps. There is  moderate calcification of the aortic valve. Aortic valve regurgitation is  not visualized. 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  Aortic valve mean gradient measures 12.5 mmHg. Dimentionless index 0.28.   8. The inferior vena cava is dilated in size with >50% respiratory  variability, suggesting right atrial pressure of 8 mmHg.   Comparison(s): Prior images unable to be directly viewed.   Epic records are reviewed at length today   CHA2DS2-VASc Score = 4  The patient's score is based upon: CHF History: 0 HTN History: 1 Diabetes History: 1 Stroke History: 0 Vascular Disease History: 0 Age Score: 2 Gender Score: 0       ASSESSMENT AND PLAN: 1. Persistent Atrial Fibrillation (ICD10:  I48.19) The patient's CHA2DS2-VASc score is 4, indicating a 4.8% annual risk of stroke.   S/p dofetilide admission 1/16-1/19/24 Patient appears to be maintaining SR.  Continue dofetilide 500 mcg BID. QT stable, ~480 ms when corrected for RBBB. Check bmet/mag today.  Continue Xarelto 20 mg daily Continue diltiazem 120 mg daily  Continue bisoprolol 7.5 mg BID  2. Secondary Hypercoagulable State (ICD10:  D68.69) The patient is at significant risk for stroke/thromboembolism based upon his CHA2DS2-VASc Score of 4.  Continue Rivaroxaban (Xarelto).   3. SSS S/p PPM, followed by Dr Ladona Ridgel and the device clinic.  4. HTN Stable, no changes today.   Follow up with Dr Ladona Ridgel in 3 months. AF clinic in 6 months.    Jorja Loa PA-C Afib Clinic [facility] [address] [place], [place] [address] [phone] 02/26/2023 8:43 AM

## 2023-02-26 ENCOUNTER — Encounter (HOSPITAL_COMMUNITY): Payer: Self-pay | Admitting: Physician Assistant

## 2023-02-26 ENCOUNTER — Ambulatory Visit (HOSPITAL_COMMUNITY)
Admission: RE | Admit: 2023-02-26 | Discharge: 2023-02-26 | Disposition: A | Discharge status: Home / Self Care | Payer: Medicare Other | Source: Ambulatory Visit | Attending: Physician Assistant | Admitting: Physician Assistant

## 2023-02-26 ENCOUNTER — Encounter: Payer: Self-pay | Admitting: Internal Medicine

## 2023-02-26 VITALS — BP 136/76 | HR 60 | Ht 69.0 in | Wt 235.0 lb

## 2023-02-26 DIAGNOSIS — I495 Sick sinus syndrome: Secondary | ICD-10-CM | POA: Diagnosis not present

## 2023-02-26 DIAGNOSIS — I5032 Chronic diastolic (congestive) heart failure: Secondary | ICD-10-CM | POA: Insufficient documentation

## 2023-02-26 DIAGNOSIS — Z950 Presence of cardiac pacemaker: Secondary | ICD-10-CM | POA: Insufficient documentation

## 2023-02-26 DIAGNOSIS — Z87891 Personal history of nicotine dependence: Secondary | ICD-10-CM | POA: Insufficient documentation

## 2023-02-26 DIAGNOSIS — K219 Gastro-esophageal reflux disease without esophagitis: Secondary | ICD-10-CM | POA: Insufficient documentation

## 2023-02-26 DIAGNOSIS — Z79899 Other long term (current) drug therapy: Secondary | ICD-10-CM | POA: Diagnosis not present

## 2023-02-26 DIAGNOSIS — Z5181 Encounter for therapeutic drug level monitoring: Secondary | ICD-10-CM

## 2023-02-26 DIAGNOSIS — Z7951 Long term (current) use of inhaled steroids: Secondary | ICD-10-CM | POA: Insufficient documentation

## 2023-02-26 DIAGNOSIS — J449 Chronic obstructive pulmonary disease, unspecified: Secondary | ICD-10-CM | POA: Insufficient documentation

## 2023-02-26 DIAGNOSIS — Z7984 Long term (current) use of oral hypoglycemic drugs: Secondary | ICD-10-CM | POA: Insufficient documentation

## 2023-02-26 DIAGNOSIS — D6869 Other thrombophilia: Secondary | ICD-10-CM

## 2023-02-26 DIAGNOSIS — I4819 Other persistent atrial fibrillation: Secondary | ICD-10-CM | POA: Diagnosis present

## 2023-02-26 DIAGNOSIS — I110 Hypertensive heart disease with heart failure: Secondary | ICD-10-CM | POA: Insufficient documentation

## 2023-02-26 DIAGNOSIS — E119 Type 2 diabetes mellitus without complications: Secondary | ICD-10-CM | POA: Diagnosis not present

## 2023-02-26 LAB — BASIC METABOLIC PANEL
Anion gap: 9 (ref 5–15)
BUN: 22 mg/dL (ref 8–23)
CO2: 25 mmol/L (ref 22–32)
Calcium: 9 mg/dL (ref 8.9–10.3)
Chloride: 106 mmol/L (ref 98–111)
Creatinine, Ser: 1.04 mg/dL (ref 0.61–1.24)
GFR, Estimated: >60 mL/min (ref >60)
Glucose, Bld: 110 mg/dL — ABNORMAL HIGH (ref 70–99)
Potassium: 4 mmol/L (ref 3.5–5.1)
Sodium: 140 mmol/L (ref 135–145)

## 2023-02-26 LAB — BRAIN NATRIURETIC PEPTIDE: B Natriuretic Peptide: 231.3 pg/mL — ABNORMAL HIGH (ref 0.0–100.0)

## 2023-02-26 LAB — MAGNESIUM: Magnesium: 2.1 mg/dL (ref 1.7–2.4)

## 2023-03-03 ENCOUNTER — Other Ambulatory Visit (HOSPITAL_COMMUNITY): Payer: Self-pay

## 2023-03-09 ENCOUNTER — Ambulatory Visit: Payer: Medicare Other | Admitting: Gastroenterology

## 2023-03-10 ENCOUNTER — Encounter (HOSPITAL_BASED_OUTPATIENT_CLINIC_OR_DEPARTMENT_OTHER): Payer: Medicare Other | Admitting: Pulmonary Disease

## 2023-03-10 ENCOUNTER — Encounter: Payer: Self-pay | Admitting: Pulmonary Disease

## 2023-03-10 ENCOUNTER — Ambulatory Visit: Payer: Medicare Other | Attending: Pulmonary Disease | Admitting: Pulmonary Disease

## 2023-03-10 DIAGNOSIS — G4733 Obstructive sleep apnea (adult) (pediatric): Secondary | ICD-10-CM

## 2023-03-10 DIAGNOSIS — G4736 Sleep related hypoventilation in conditions classified elsewhere: Secondary | ICD-10-CM | POA: Insufficient documentation

## 2023-03-10 NOTE — Progress Notes (Signed)
Subjective:    Patient ID: Clarence Dawson, male    DOB: 20-May-1946, 77 y.o.   MRN: [id number] Patient Care Team: [name], MD as PCP - General (Internal Medicine) Evans Lance, MD as PCP - Cardiology (Cardiology) Evans Lance, MD as PCP - Electrophysiology (Cardiology)  Chief Complaint  Patient presents with   Follow-up    HPI Patient is a 77 year old former smoker (41 PY) who presents for follow-up of asthma with COPD overlap, bronchiectasis and chronic respiratory failure with hypoxia.  He was last seen on 16 December 2021.  At that time he had been stable on Breztri twice a day.  This is a scheduled visit.  The patient notes that since being on Breztri he actually has been doing better with regards to his symptoms of shortness of breath and cough during the day.  His only complaint is that of increased congestion in the mornings which usually clears after he drinks a second cup of coffee in the morning.  He notes that he is on CPAP for sleep apnea and sometimes the humidity reservoir is empty in the mornings.  This is managed by his primary care physician. He is compliant with vest physiotherapy and supplemental oxygen.  At his prior visit he also noted that he had had issues with atrial fibrillation and had required cardioversion.  He continues to follow-up with cardiology as he has intermittent issues with this.  He follows with electrophysiology and has pacemaker in place as well.  As noted, he does not endorse any other respiratory issues or symptoms today.  He is on Xolair for his asthmatic component is that he feels well on the medication for approximately 2 weeks and then the effect wanes.   Review of Systems A 10 point review of systems was performed and it is as noted above otherwise negative.  Patient Active Problem List   Diagnosis Date Noted   Hypercoagulable state due to persistent atrial fibrillation ([place]) 01/02/2020   Chronic diastolic heart failure ([place])  11/22/2019   Paroxysmal atrial fibrillation ([place]) 09/22/2019   Atrial fibrillation with RVR ([place])    Lobar pneumonia ([place]) 09/21/2019   Acute respiratory failure with hypoxia ([place]) 09/21/2019   Acute on chronic respiratory failure ([place]) 09/18/2019   Severe sepsis (HCC)    Elevated lactic acid level    GERD (gastroesophageal reflux disease)    Chronic bronchitis ([place]) 02/09/2019   CAP (community acquired pneumonia) 03/03/2018   Primary localized osteoarthritis of right knee 08/11/2017   Asthma with acute exacerbation 12/08/2016   Chest pain 10/20/2016   HTN (hypertension) 10/20/2016   Morbid obesity due to excess calories ([place]) 06/24/2016   Severe persistent asthma 02/12/2016   Upper airway cough syndrome 01/24/2016   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   Persistent atrial fibrillation    Syncope    Hyperthyroidism    Hyperlipidemia    RBBB (right bundle branch block)    Tricuspid valve disorder    Aortic valve disorder    Carotid artery stenosis    Type 2 diabetes mellitus ([place])    Pacemaker    OSA (obstructive sleep apnea)    Arthritis    Sinoatrial node dysfunction (HCC)    PVC's (premature ventricular contractions) 07/17/2011   Essential hypertension, benign 01/24/2011   Premature ventricular contractions 01/24/2011   PPM-[facility] 01/24/2011   Social History   Tobacco Use   Smoking status: Former    Packs/day: 3.00    Years: 25.00    Total  pack years: 75.00    Types: Cigarettes    Start date: 02/20/1958    Quit date: 02/12/1984    Years since quitting: 39.0   Smokeless tobacco: Former    Types: Chew   Tobacco comments:    Former smoker 08/30/2021  Substance Use Topics   Alcohol use: Yes    Alcohol/week: 2.0 - 4.0 standard drinks of alcohol    Types: 1 - 2 Glasses of wine, 1 - 2 Cans of beer per week    Comment: 1 glass of wine or beer 3 times a week 12/26/22   Allergies  Allergen Reactions   Food Anaphylaxis and Shortness Of Breath    TREE NUTS   Iodinated Contrast  Media Hives and Shortness Of Breath    Patient states hives to throat closing. (01/15/17: patient states this reaction was "about 20 years ago" with possibly an IVP.  He now says high doses of prednisone "throw me into AFib."  He has tolerated CT arthrograms with Benadrly '50mg'$  PO one hour before injection.  Brita Romp, RN)   Shellfish Allergy Anaphylaxis and Shortness Of Breath    To shellfish, crabs.  Makes him feel like "things are crawling all over" me.  Denies airway issues with these foods.  Brita Romp, RN 01/15/17)   Goat-Derived Products Hives    GOAT CHEESE    Prednisone Palpitations    PRECIPITATES A-FIB   Diclofenac Sodium Other (See Comments)    Hives, "buggy feeling all over"   Metformin And Related Diarrhea    High doses at once   Vancomycin Anxiety    Red man syndrome   Voltaren [Diclofenac Sodium] Other (See Comments)    Feels like things are crawling on him   Current Meds  Medication Sig   acetaminophen (TYLENOL) 500 MG tablet Take 500 mg by mouth 4 (four) times daily.   albuterol (PROVENTIL HFA;VENTOLIN HFA) 108 (90 Base) MCG/ACT inhaler Inhale 2 puffs into the lungs every 4 (four) hours as needed for shortness of breath (only if you can't catch your breath/ asthma).   atorvastatin (LIPITOR) 20 MG tablet Take 20 mg by mouth at bedtime.   bisoprolol (ZEBETA) 5 MG tablet TAKE 1 AND 1/2 TABLET BY MOUTH TWICE DAILY.   Carboxymethylcellul-Glycerin (LUBRICATING EYE DROPS OP) Place 1 drop into the right eye daily as needed (dry eyes).   EPINEPHrine 0.3 mg/0.3 mL IJ SOAJ injection Inject 0.3 mg into the muscle as needed for anaphylaxis.   glipiZIDE (GLUCOTROL XL) 5 MG 24 hr tablet Take 5 mg by mouth daily before breakfast.   JARDIANCE 25 MG TABS tablet Take 25 mg by mouth daily before breakfast.   losartan (COZAAR) 50 MG tablet Take 50 mg by mouth at bedtime.   metFORMIN (GLUCOPHAGE-XR) 500 MG 24 hr tablet Take 500 mg by mouth in the morning and at bedtime.   Multiple Vitamin  (MULTIVITAMIN) tablet Take 1 tablet by mouth daily.   omeprazole (PRILOSEC OTC) 20 MG tablet Take 20 mg by mouth every morning.   Respiratory Therapy Supplies (FLUTTER) DEVI Use as directed   rivaroxaban (XARELTO) 20 MG TABS tablet Take 20 mg by mouth at bedtime.   [DISCONTINUED] Budeson-Glycopyrrol-Formoterol (BREZTRI AEROSPHERE) 160-9-4.8 MCG/ACT AERO Inhale 2 puffs into the lungs in the morning and at bedtime.   [DISCONTINUED] CARTIA XT 120 MG 24 hr capsule TAKE (1) CAPSULE BY MOUTH DAILY, MAY TAKE A EXTRA CAPSULE DAILY FOR BREAKTHROUGH AFIB. (Patient taking differently: Take 120 mg by mouth daily.)   [  DISCONTINUED] diphenhydramine-acetaminophen (TYLENOL PM) 25-500 MG TABS tablet Take 1 tablet by mouth at bedtime as needed (sleep/pain). (Patient not taking: Reported on 01/13/2023)   flecainide (TAMBOCOR) 150 MG tablet TAKE 1/2 TABLET BY MOUTH TWICE DAILY.   furosemide (LASIX) 40 MG tablet TAKE 1 TABLET BY MOUTH DAILY   levothyroxine (SYNTHROID, LEVOTHROID) 137 MCG tablet Take 137 mcg by mouth daily before breakfast.     XOLAIR 150 MG/ML prefilled syringe INJECT '300MG'$  SUBCUTANEOUSLY EVERY 4 WEEKS   Immunization History  Administered Date(s) Administered   Fluad Quad(high Dose 65+) 09/12/2019   Influenza Split 11/14/2015, 10/11/2018   Influenza, High Dose Seasonal PF 09/28/2017   Influenza-Unspecified 11/02/2016, 09/03/2020, 10/03/2021   Moderna SARS-COV2 Booster Vaccination 08/20/2020, 03/04/2021   Moderna Sars-Covid-2 Vaccination 02/15/2020, 03/14/2020   Pneumococcal Conjugate-13 09/28/2015   Tdap 11/28/2019       Objective:   Physical Exam BP 126/86 (BP Location: Left Arm, Patient Position: Sitting, Cuff Size: Normal)   Pulse 90   Temp 98.5 F (36.9 C) (Oral)   Ht '5\' 9"'$  (1.753 m)   Wt 228 lb 3.2 oz (103.5 kg)   SpO2 93%   BMI 33.70 kg/m   SpO2: 93 % O2 Device: POC, 3 L/min  GENERAL: Well-developed, obese patient in no acute distress.  He is comfortable on nasal cannula O2.   He is fully ambulatory. HEAD: Normocephalic, atraumatic.  EYES: Pupils equal, round, reactive to light.  No scleral icterus.  MOUTH: Nose/mouth/throat not examined due to masking requirements for COVID 19.   NECK: Supple. No thyromegaly. Trachea midline. No JVD.  No adenopathy. PULMONARY: Good air entry bilaterally.  Symmetrical, nonlabored, coarse, otherwise, no adventitious sounds. CARDIOVASCULAR: S1 and S2. Regular rate and rhythm.  Grade 2/6 mitral regurgitation murmur.  Pacemaker on left. GASTROINTESTINAL: Obese otherwise benign. MUSCULOSKELETAL: No joint deformity, no clubbing, no edema.  NEUROLOGIC: No focal deficits, speech is fluent.  No gait disturbance noted.  Awake, alert and oriented. SKIN: Intact,warm,dry. PSYCH: Mood and behavior appropriate.      Assessment & Plan:     ICD-10-CM   1. Asthma-COPD overlap syndrome (HCC)  J44.9    Continue Breztri 2 puffs twice a day Continue as needed albuterol Continue Xolair for now Will reassess Xolair in future    2. Bronchiectasis without complication (HCC)  [id number]    Continue vest physiotherapy Continue flutter valve    3. Chronic respiratory failure with hypoxia (HCC)  J96.11    Compliant with oxygen at 3 L/min Continue oxygen therapy     Will continue current therapy for now.  Will see him in follow-up in 3 months time he is to call sooner should any new problems arise.   Renold Don, MD Advanced Bronchoscopy P[facility]    *This note was dictated using voice recognition software/Dragon.  Despite best efforts to proofread, errors can occur which can change the meaning. Any transcriptional errors that result from this process are unintentional and may not be fully corrected at the time of dictation.

## 2023-03-10 NOTE — Progress Notes (Signed)
Remote pacemaker transmission.   

## 2023-03-15 DIAGNOSIS — G4733 Obstructive sleep apnea (adult) (pediatric): Secondary | ICD-10-CM

## 2023-03-16 NOTE — Procedures (Signed)
Patient Name: Clarence Dawson, Phimmasone Date: 03/10/2023 Gender: Male D.O.B: 03/25/1946 Age (years): 15 Referring Provider: Vernard Gambles MD Height (inches): 59 Interpreting Physician: Kara Mead MD, ABSM Weight (lbs): 220 RPSGT: Rosebud Poles BMI: 44 MRN: [id number] Neck Size: 18.00 <br> <br> CLINICAL INFORMATION The patient is referred for a BiPAP titration to treat sleep apnea. Residual events noted on CPAP download    Date of NPSG: 05/2020 with AHI 12/h & low sat of 80%  SLEEP STUDY TECHNIQUE As per the AASM Manual for the Scoring of Sleep and Associated Events v2.3 (April 2016) with a hypopnea requiring 4% desaturations.  The channels recorded and monitored were frontal, central and occipital EEG, electrooculogram (EOG), submentalis EMG (chin), nasal and oral airflow, thoracic and abdominal wall motion, anterior tibialis EMG, snore microphone, electrocardiogram, and pulse oximetry. Bilevel positive airway pressure (BPAP) was initiated at the beginning of the study and titrated to treat sleep-disordered breathing.  MEDICATIONS Medications self-administered by patient taken the night of the study : N/A  RESPIRATORY PARAMETERS Optimal IPAP Pressure (cm):  AHI at Optimal Pressure (/hr) N/A Optimal EPAP Pressure (cm):    Overall Minimal O2 (%): 73.00 Minimal O2 at Optimal Pressure (%): 73.00 SLEEP ARCHITECTURE Start Time: 11:00:37 PM Stop Time: 6:16:37 AM Total Time (min): 436 Total Sleep Time (min): 356.5 Sleep Latency (min): 5.8 Sleep Efficiency (%): 81.8 REM Latency (min): 86.0 WASO (min): 73.7 Stage N1 (%): 4.07 Stage N2 (%): 52.03 Stage N3 (%): 27.35 Stage R (%): 16.6 Supine (%): 100.00 Arousal Index (/hr): 6.6     CARDIAC DATA The 2 lead EKG demonstrated sinus rhythm, pacemaker generated. The mean heart rate was 60.30 beats per minute. Other EKG findings include: None.   LEG MOVEMENT DATA The total Periodic Limb Movements of Sleep (PLMS) were 0. The PLMS index was  0.00. A PLMS index of <15 is considered normal in adults.  IMPRESSIONS - An optimal PAP pressure could not be selected for this patient based on the available study data.CPAP was titrated to 20 cm, then switched to BiPAP which was titrated to 26/21. Although events were controlled, desaturations persisted inspite of blending 3L O2 - Severe oxygen desaturations were observed during this titration (min O2 = 73.00%). 3L O2 was used during the study - The patient snored with moderate snoring volume. - No cardiac abnormalities were observed during this study. - Clinically significant periodic limb movements were not noted during this study. Arousals associated with PLMs were rare.   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 May need nocturnal oximetry to recheck oxygen needs after a few weeks of BiPAP use. - Avoid alcohol, sedatives and other CNS depressants that may worsen sleep apnea and disrupt normal sleep architecture. - Sleep hygiene should be reviewed to assess factors that may improve sleep quality. - Weight management and regular exercise should be initiated or continued. - Return to [facility] for re-evaluation after 4 weeks of therapy   Kara Mead MD Board Certified in [place]

## 2023-03-17 ENCOUNTER — Encounter: Payer: Self-pay | Admitting: Internal Medicine

## 2023-03-17 ENCOUNTER — Ambulatory Visit: Payer: Medicare Other | Admitting: Internal Medicine

## 2023-03-17 VITALS — BP 165/76 | HR 60 | Temp 98.0°F | Ht 69.0 in | Wt 228.4 lb

## 2023-03-17 DIAGNOSIS — K219 Gastro-esophageal reflux disease without esophagitis: Secondary | ICD-10-CM | POA: Diagnosis not present

## 2023-03-17 DIAGNOSIS — Z1211 Encounter for screening for malignant neoplasm of colon: Secondary | ICD-10-CM | POA: Diagnosis not present

## 2023-03-17 DIAGNOSIS — Z1212 Encounter for screening for malignant neoplasm of rectum: Secondary | ICD-10-CM | POA: Diagnosis not present

## 2023-03-17 NOTE — Patient Instructions (Signed)
It was good to see you again today!  Continue omeprazole 20 mg daily-best taken 30 minutes before breakfast.   We discussed the pros and cons of 1 more colonoscopy..  Given your age and negative colonoscopy 10 years ago, in the setting of your other medical problems, we have decided not to pursue another screening colonoscopy.  Course, should you have any future new GI symptoms, please do not hesitate to call me.  Your blood pressure was up today will be rechecked  No scheduled follow-up at this time.  Keep your follow-up appointment with Dr. Willey Blade.

## 2023-03-17 NOTE — Progress Notes (Unsigned)
Primary Care Physician:  [name], MD Primary Gastroenterologist:  Dr. Gala Romney  Pre-Procedure History & Physical: HPI:  Clarence Dawson is a 77 y.o. male here for follow-up and discuss potentially 1 more screening colonoscopy.  Longstanding GERD well-controlled on omeprazole.  No dysphagia.  Last colonoscopy exactly years ago.  Left-sided diverticulosis.  No polyps.  Prior colonoscopy in [place] for rectal bleeding yielded only hemorrhoids.  Positive family history of colon cancer in his mother but it occurred at advanced age (greater than 66) he has no lower GI tract symptoms at this time. He has multiple comorbidities including heart and lung problems.  He wears an oxygen concentrator.  Past Medical History:  Diagnosis Date   Allergic rhinitis    Aortic valve disorder    Asthma    since childhood- seasonal allergies induced   Cancer ([place])    Skin cancer- squamous, basal   Carotid artery stenosis    Essential hypertension    Full dentures    GERD (gastroesophageal reflux disease)    H/O hiatal hernia    Hemorrhage of rectum    Hyperlipidemia    Hypothyroidism    Male circumcision    OSA (obstructive sleep apnea)    Osteoarthritis    Pacemaker    Oct 2005 in [place].   PAF (paroxysmal atrial fibrillation) ([place])    Pneumonia    "several Times" 2015 last time   Primary localized osteoarthritis of right knee 08/11/2017   RBBB (right bundle branch block)    Sinoatrial node dysfunction (HCC)    Syncope    Tricuspid valve disorder    Type 2 diabetes mellitus ([place])    Type II    Past Surgical History:  Procedure Laterality Date   A-V CARDIAC PACEMAKER INSERTION     Sick sinus syndrome DDR pacer   Arthropathy Right 2005   Rebuilding of left thumb and joint    CARDIAC CATHETERIZATION     CARDIAC ELECTROPHYSIOLOGY STUDY AND ABLATION  09/2008   for pvcs, Dr. Loralie Champagne   CARDIOVERSION N/A 12/18/2016   Procedure: CARDIOVERSION;  Surgeon: Pixie Casino, MD;  Location:  [facility] ENDOSCOPY;  Service: Cardiovascular;  Laterality: N/A;   CARDIOVERSION N/A 09/05/2021   Procedure: CARDIOVERSION;  Surgeon: Geralynn Rile, MD;  Location: [place];  Service: Cardiovascular;  Laterality: N/A;   CARDIOVERSION N/A 08/06/2022   Procedure: CARDIOVERSION;  Surgeon: Donato Heinz, MD;  Location: [facility] ENDOSCOPY;  Service: Cardiovascular;  Laterality: N/A;   [place]   right wrist   CARPAL TUNNEL RELEASE  05/04/2012   Procedure: CARPAL TUNNEL RELEASE;  Surgeon: Wynonia Sours, MD;  Location: [place];  Service: Orthopedics;  Laterality: Left;   CARPOMETACARPEL SUSPENSION PLASTY Right 11/16/2014   Procedure: SUSPENSIONPLASTY RIGHT THUMB TENDON TRANSFER ABDUCTOR POLLICUS LONGUS EXCISION TRAPEZIUM;  Surgeon: Daryll Brod, MD;  Location: [facility];  Service: Orthopedics;  Laterality: Right;   CHOLECYSTECTOMY  1994   CIRCUMCISION     COLONOSCOPY N/A 03/14/2013   Procedure: COLONOSCOPY;  Surgeon: Daneil Dolin, MD;  [facility];  Service: Endoscopy;  Laterality: N/A;  8:15 AM   EYE SURGERY     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-[facility]   EYE SURGERY  2012   Left eye Corneal transplant- partial- Cataract   FLEXIBLE BRONCHOSCOPY Right 06/23/2019   Procedure: FLEXIBLE BRONCHOSCOPY RIGHT;  Surgeon: [name], MD;  Location: [facility] ORS;  Service: Pulmonary;  Laterality: Right;   GALLBLADDER SURGERY  12/01/2006   HAND TENDON SURGERY Left late 1990's   thumb   HEMORROIDECTOMY  2003   INJECTION KNEE Left 08/26/2017   Procedure: LEFT KNEE INJECTION;  Surgeon: Elsie Saas, MD;  Location: [place];  Service: Orthopedics;  Laterality: Left;   left Knee Arthroscopy     [facility]   PARTIAL KNEE ARTHROPLASTY  11/22/2012   Procedure: UNICOMPARTMENTAL KNEE;  Surgeon: Lorn Junes, MD;  Location: [place];  Service: Orthopedics;  Laterality: Left;  left unicompartmental knee arthroplasty    PERMANENT PACEMAKER GENERATOR CHANGE N/A 01/12/2013   Procedure: PERMANENT PACEMAKER GENERATOR CHANGE;  Surgeon: Evans Lance, MD;  Location: [facility] CATH LAB;  Service: Cardiovascular;  Laterality: N/A;   PPM GENERATOR CHANGEOUT N/A 07/07/2022   Procedure: PPM GENERATOR CHANGEOUT;  Surgeon: Evans Lance, MD;  Location: [place] CV LAB;  Service: Cardiovascular;  Laterality: N/A;   Rotator cuff Surgery  2001   Right shoulder   TONSILLECTOMY     TOTAL KNEE ARTHROPLASTY Right 08/26/2017   Procedure: TOTAL KNEE ARTHROPLASTY;  Surgeon: Elsie Saas, MD;  Location: [place];  Service: Orthopedics;  Laterality: Right;   varicose vein reduction      Prior to Admission medications   Medication Sig Start Date End Date Taking? Authorizing Provider  acetaminophen (TYLENOL) 500 MG tablet Take 500 mg by mouth 4 (four) times daily.   Yes [provider]  albuterol (PROVENTIL HFA;VENTOLIN HFA) 108 (90 Base) MCG/ACT inhaler Inhale 2 puffs into the lungs every 4 (four) hours as needed for shortness of breath (only if you can't catch your breath/ asthma).   Yes [provider]  albuterol (PROVENTIL) (2.5 MG/3ML) 0.083% nebulizer solution Take 3 mLs (2.5 mg total) by nebulization every 6 (six) hours as needed for wheezing or shortness of breath. 02/04/23  Yes Tyler Pita, MD  atorvastatin (LIPITOR) 20 MG tablet Take 20 mg by mouth at bedtime. 04/27/20  Yes [provider]  bisoprolol (ZEBETA) 5 MG tablet TAKE 1 AND 1/2 TABLET BY MOUTH TWICE DAILY. 04/17/17  Yes Sherran Needs, NP  BREZTRI AEROSPHERE 160-9-4.8 MCG/ACT AERO INHALE 2 PUFFS INTO THE LUNGS (IN THE MORNING AND AT BEDTIME). 10/02/22  Yes Tyler Pita, MD  Carboxymethylcellul-Glycerin (LUBRICATING EYE DROPS OP) Place 1 drop into the right eye daily as needed (dry eyes).   Yes [provider]  diltiazem (CARTIA XT) 120 MG 24 hr capsule TAKE (1) CAPSULE BY MOUTH DAILY, MAY TAKE A EXTRA CAPSULE DAILY FOR  BREAKTHROUGH AFIB. 12/11/22  Yes Evans Lance, MD  dofetilide (TIKOSYN) 500 MCG capsule Take 1 capsule (500 mcg total) by mouth 2 (two) times daily. 01/27/23  Yes Fenton, Clint R, PA  Dupilumab (DUPIXENT) 300 MG/2ML SOPN Inject 300 mg into the skin every 14 (fourteen) days. 11/05/22  Yes Tyler Pita, MD  EPINEPHrine 0.3 mg/0.3 mL IJ SOAJ injection Inject 0.3 mg into the muscle as needed for anaphylaxis. 11/05/20  Yes Tyler Pita, MD  furosemide (LASIX) 40 MG tablet TAKE 1 TABLET BY MOUTH DAILY 07/09/22  Yes Evans Lance, MD  glipiZIDE (GLUCOTROL XL) 5 MG 24 hr tablet Take 5 mg by mouth daily before breakfast.   Yes [provider]  hydrocortisone cream 0.5 % Apply 1 Application topically daily as needed for itching.   Yes [provider]  JARDIANCE 25 MG TABS tablet Take 25 mg by mouth daily before breakfast. 04/11/21  Yes [provider]  levothyroxine (  SYNTHROID) 137 MCG tablet Take 137 mcg by mouth daily before breakfast. 06/27/22  Yes [provider]  losartan (COZAAR) 50 MG tablet Take 50 mg by mouth at bedtime. 04/27/20  Yes [provider]  metFORMIN (GLUCOPHAGE-XR) 500 MG 24 hr tablet Take 500 mg by mouth in the morning and at bedtime.   Yes [provider]  Multiple Vitamin (MULTIVITAMIN) tablet Take 1 tablet by mouth daily.   Yes [provider]  omeprazole (PRILOSEC OTC) 20 MG tablet Take 20 mg by mouth every morning.   Yes [provider]  Respiratory Therapy Supplies (FLUTTER) DEVI Use as directed 10/10/19  Yes Tyler Pita, MD  rivaroxaban (XARELTO) 20 MG TABS tablet Take 20 mg by mouth at bedtime.   Yes [provider]    Allergies as of 03/17/2023 - Review Complete 03/17/2023  Allergen Reaction Noted   Food Anaphylaxis and Shortness Of Breath 04/30/2012   Iodinated contrast media Hives and Shortness Of Breath 10/20/2016   Shellfish allergy Anaphylaxis and Shortness Of Breath  11/13/2014   Goat-derived products Hives 08/21/2017   Prednisone Palpitations 01/26/2017   Diclofenac sodium Other (See Comments) 11/27/2011   Metformin and related Diarrhea 06/17/2019   Vancomycin Anxiety 09/18/2019   Voltaren [diclofenac sodium] Other (See Comments) 04/30/2012    Family History  Problem Relation Age of Onset   Liver cancer Mother    Pancreatic cancer Mother    Colon cancer Mother    Hypertension Father    Stroke Father    Other Father 45       Sudden Cardiac death   Heart attack Father    Cancer Sister        brain   Diabetes Sister    Colon cancer Maternal Aunt    Colon polyps Neg Hx     Social History   Socioeconomic History   Marital status: Widowed    Spouse name: Not on file   Number of children: Not on file   Years of education: Not on file   Highest education level: Not on file  Occupational History   Not on file  Tobacco Use   Smoking status: Former    Packs/day: 3.00    Years: 25.00    Additional pack years: 0.00    Total pack years: 75.00    Types: Cigarettes    Start date: 02/20/1958    Quit date: 02/12/1984    Years since quitting: 39.1   Smokeless tobacco: Former    Types: Chew   Tobacco comments:    Former smoker 08/30/2021  Vaping Use   Vaping Use: Never used  Substance and Sexual Activity   Alcohol use: Yes    Alcohol/week: 2.0 - 4.0 standard drinks of alcohol    Types: 1 - 2 Glasses of wine, 1 - 2 Cans of beer per week    Comment: 1 glass of wine or beer 3 times a week 12/26/22   Drug use: No   Sexual activity: Not Currently  Other Topics Concern   Not on file  Social History Narrative   Regular exercise: No   Social Determinants of Health   Financial Resource Strain: Not on file  Food Insecurity: No Food Insecurity (01/13/2023)   Hunger Vital Sign    Worried About Running Out of Food in the Last Year: Never true    Ran Out of Food in the Last Year: Never true  Transportation Needs: No Transportation Needs  (01/13/2023)   PRAPARE - Transportation  Lack of Transportation (Medical): No    Lack of Transportation (Non-Medical): No  Physical Activity: Not on file  Stress: Not on file  Social Connections: Not on file  Intimate Partner Violence: Not At Risk (01/13/2023)   Humiliation, Afraid, Rape, and Kick questionnaire    Fear of Current or Ex-Partner: No    Emotionally Abused: No    Physically Abused: No    Sexually Abused: No    Review of Systems: See HPI, otherwise negative ROS  Physical Exam: BP (!) 142/74 (BP Location: Right Arm, Patient Position: Sitting, Cuff Size: Large)   Pulse 60   Temp 98 F (36.7 C) (Oral)   Ht 5\' 9"  (1.753 m)   Wt 228 lb 6.4 oz (103.6 kg)   SpO2 91% Comment: on O2  BMI 33.73 kg/m  General:   Alert,   pleasant and cooperative in NAD; O2 concentrator in place. Neck:  Supple; no masses or thyromegaly. No significant cervical adenopathy. Abdomen: Non-distended, normal bowel sounds.  Soft and nontender without appreciable mass or hepatosplenomegaly.   Extremities:  Without clubbing or edema.  Impression/Plan: 77 year old gentleman with multiple health issues including significant cardiopulmonary disease presents for discussion regarding 1 more screening colonoscopy.  His history and prior colonoscopy findings reviewed.  He has essentially aged out for average risk screening.  Mother with colon cancer but and advanced age.  He remains an average rescreening group.  All things considered, I do not feel strongly he needs to have another screening colonoscopy.  We I discussed this with him.  He also agrees he does not want to have one unless it is absolutely necessary.  GERD well-controlled on omeprazole 20 mg daily without any alarm symptoms.  Recommendations:  Continue omeprazole 20 mg daily-best taken 30 minutes before breakfast.  We discussed the pros and cons of 1 more colonoscopy..  Given your age and negative colonoscopy 10 years ago, in the setting of  your other medical problems, we have decided not to pursue another screening colonoscopy.  Course, should you have any future new GI symptoms, please do not hesitate to call me.  blood pressure was up today will be rechecked  No scheduled follow-up at this time.  Keep your follow-up appointment with Dr. Willey Blade.    Notice: This dictation was prepared with Dragon dictation along with smaller phrase technology. Any transcriptional errors that result from this process are unintentional and may not be corrected upon review.

## 2023-03-18 ENCOUNTER — Encounter: Payer: Self-pay | Admitting: Pulmonary Disease

## 2023-03-18 ENCOUNTER — Ambulatory Visit: Payer: Medicare Other | Admitting: Pulmonary Disease

## 2023-03-18 VITALS — BP 130/78 | HR 60 | Temp 98.1°F | Ht 69.0 in | Wt 228.0 lb

## 2023-03-18 DIAGNOSIS — J4489 Other specified chronic obstructive pulmonary disease: Secondary | ICD-10-CM

## 2023-03-18 DIAGNOSIS — J479 Bronchiectasis, uncomplicated: Secondary | ICD-10-CM

## 2023-03-18 DIAGNOSIS — J9611 Chronic respiratory failure with hypoxia: Secondary | ICD-10-CM

## 2023-03-18 DIAGNOSIS — J4550 Severe persistent asthma, uncomplicated: Secondary | ICD-10-CM | POA: Diagnosis not present

## 2023-03-18 DIAGNOSIS — G4733 Obstructive sleep apnea (adult) (pediatric): Secondary | ICD-10-CM | POA: Diagnosis not present

## 2023-03-18 NOTE — Patient Instructions (Signed)
We have ordered you new BiPAP equipment.  We have ordered also a oxygen determination on your BiPAP equipment and oxygen once you get this equipment in.  This will be an in-home test.  We will see him in follow-up in 3 months time call sooner should any new problems arise.

## 2023-03-18 NOTE — Progress Notes (Signed)
Subjective:    Patient ID: Clarence Dawson, male    DOB: 07/22/1946, 77 y.o.   MRN: [id number] Patient Care Team: [name], MD as PCP - General (Internal Medicine) Evans Lance, MD as PCP - Cardiology (Cardiology) Evans Lance, MD as PCP - Electrophysiology (Cardiology)  Chief Complaint  Patient presents with   Follow-up    No SOB. Wheezing in the morning. Cough with clear to white sputum in the mornings.   HPI Patient is a 77 year old former smoker (59 PY) who presents for follow-up of asthma with COPD overlap, bronchiectasis, obstructive sleep apnea and chronic respiratory failure with hypoxia.  He was last seen on 04 February 2023.  During that visit it was noted that his CPAP compliance download showed he had residual AHI of 18.6 and he was close to the maximum pressure the CPAP delivered.  Because of this he was scheduled for a titration study as it was suspected that he would need BiPAP.  He had the titration study on 10 March 2023, this showed that he needs BiPAP in addition on 3 L of oxygen bled in.  Recommended pressure of auto BiPAP with EPAP minimum at 10, IPAP max at 25 and pressure support of 4 cm H2O.  This has been ordered for him today.  He is on oxygen therapy at 3 L/min and is compliant.  He uses a POC.  Recently has not had any fevers, chills or sweats.  No chest pain.  No lower extremity edema, no calf tenderness.  He has morning congestion and cough but once he clears this he is "good for the rest of the day".  He has been more compliant with vest physiotherapy twice a day.  He is on Breztri 2 puffs twice a day, as needed albuterol, and Dupixent for control of his COPD/asthma.  He is compliant with all the medications.  He does not endorse any other symptomatology today.  Review of Systems A 10 point review of systems was performed and it is as noted above otherwise negative.  Patient Active Problem List   Diagnosis Date Noted   Hypercoagulable state due to  persistent atrial fibrillation ([place]) 01/02/2020   Chronic diastolic heart failure ([place]) 11/22/2019   Paroxysmal atrial fibrillation ([place]) 09/22/2019   Atrial fibrillation with RVR ([place])    Lobar pneumonia ([place]) 09/21/2019   Acute respiratory failure with hypoxia ([place]) 09/21/2019   Acute on chronic respiratory failure ([place]) 09/18/2019   Severe sepsis (HCC)    Elevated lactic acid level    GERD (gastroesophageal reflux disease)    Chronic bronchitis ([place]) 02/09/2019   CAP (community acquired pneumonia) 03/03/2018   Primary localized osteoarthritis of right knee 08/11/2017   Asthma with acute exacerbation 12/08/2016   Chest pain 10/20/2016   HTN (hypertension) 10/20/2016   Morbid obesity due to excess calories ([place]) 06/24/2016   Severe persistent asthma 02/12/2016   Upper airway cough syndrome 01/24/2016   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   Persistent atrial fibrillation    Syncope    Hyperthyroidism    Hyperlipidemia    RBBB (right bundle branch block)    Tricuspid valve disorder    Aortic valve disorder    Carotid artery stenosis    Type 2 diabetes mellitus ([place])    Pacemaker    OSA (obstructive sleep apnea)    Arthritis    Sinoatrial node dysfunction (HCC)    PVC's (premature ventricular contractions) 07/17/2011   Essential hypertension, benign 01/24/2011   Premature  ventricular contractions 01/24/2011   PPM-[place] 01/24/2011   Social History   Tobacco Use   Smoking status: Former    Packs/day: 3.00    Years: 25.00    Additional pack years: 0.00    Total pack years: 75.00    Types: Cigarettes    Start date: 02/20/1958    Quit date: 02/12/1984    Years since quitting: 39.1   Smokeless tobacco: Former    Types: Chew   Tobacco comments:    Former smoker 08/30/2021  Substance Use Topics   Alcohol use: Yes    Alcohol/week: 2.0 - 4.0 standard drinks of alcohol    Types: 1 - 2 Glasses of wine, 1 - 2 Cans of beer per week    Comment: 1 glass of wine or beer 3 times a week  12/26/22   Allergies  Allergen Reactions   Food Anaphylaxis and Shortness Of Breath    TREE NUTS   Iodinated Contrast Media Hives and Shortness Of Breath    Patient states hives to throat closing. (01/15/17: patient states this reaction was "about 20 years ago" with possibly an IVP.  He now says high doses of prednisone "throw me into AFib."  He has tolerated CT arthrograms with Benadrly 50mg  PO one hour before injection.  Brita Romp, RN)   Shellfish Allergy Anaphylaxis and Shortness Of Breath    To shellfish, crabs.  Makes him feel like "things are crawling all over" me.  Denies airway issues with these foods.  Brita Romp, RN 01/15/17)   Goat-Derived Products Hives    GOAT CHEESE    Prednisone Palpitations    PRECIPITATES A-FIB   Diclofenac Sodium Other (See Comments)    Hives, "buggy feeling all over"   Metformin And Related Diarrhea    High doses at once   Vancomycin Anxiety    Red man syndrome   Voltaren [Diclofenac Sodium] Other (See Comments)    Feels like things are crawling on him   Current Meds  Medication Sig   acetaminophen (TYLENOL) 500 MG tablet Take 500 mg by mouth 4 (four) times daily.   albuterol (PROVENTIL HFA;VENTOLIN HFA) 108 (90 Base) MCG/ACT inhaler Inhale 2 puffs into the lungs every 4 (four) hours as needed for shortness of breath (only if you can't catch your breath/ asthma).   albuterol (PROVENTIL) (2.5 MG/3ML) 0.083% nebulizer solution Take 3 mLs (2.5 mg total) by nebulization every 6 (six) hours as needed for wheezing or shortness of breath.   atorvastatin (LIPITOR) 20 MG tablet Take 20 mg by mouth at bedtime.   bisoprolol (ZEBETA) 5 MG tablet TAKE 1 AND 1/2 TABLET BY MOUTH TWICE DAILY.   BREZTRI AEROSPHERE 160-9-4.8 MCG/ACT AERO INHALE 2 PUFFS INTO THE LUNGS (IN THE MORNING AND AT BEDTIME).   Carboxymethylcellul-Glycerin (LUBRICATING EYE DROPS OP) Place 1 drop into the right eye daily as needed (dry eyes).   diltiazem (CARTIA XT) 120 MG 24 hr capsule TAKE  (1) CAPSULE BY MOUTH DAILY, MAY TAKE A EXTRA CAPSULE DAILY FOR BREAKTHROUGH AFIB.   dofetilide (TIKOSYN) 500 MCG capsule Take 1 capsule (500 mcg total) by mouth 2 (two) times daily.   Dupilumab (DUPIXENT) 300 MG/2ML SOPN Inject 300 mg into the skin every 14 (fourteen) days.   EPINEPHrine 0.3 mg/0.3 mL IJ SOAJ injection Inject 0.3 mg into the muscle as needed for anaphylaxis.   furosemide (LASIX) 40 MG tablet TAKE 1 TABLET BY MOUTH DAILY   glipiZIDE (GLUCOTROL XL) 5 MG 24 hr tablet Take 5 mg  by mouth daily before breakfast.   hydrocortisone cream 0.5 % Apply 1 Application topically daily as needed for itching.   JARDIANCE 25 MG TABS tablet Take 25 mg by mouth daily before breakfast.   levothyroxine (SYNTHROID) 137 MCG tablet Take 137 mcg by mouth daily before breakfast.   losartan (COZAAR) 50 MG tablet Take 50 mg by mouth at bedtime.   metFORMIN (GLUCOPHAGE-XR) 500 MG 24 hr tablet Take 500 mg by mouth in the morning and at bedtime.   Multiple Vitamin (MULTIVITAMIN) tablet Take 1 tablet by mouth daily.   omeprazole (PRILOSEC OTC) 20 MG tablet Take 20 mg by mouth every morning.   Respiratory Therapy Supplies (FLUTTER) DEVI Use as directed   rivaroxaban (XARELTO) 20 MG TABS tablet Take 20 mg by mouth at bedtime.   Immunization History  Administered Date(s) Administered   Fluad Quad(high Dose 65+) 09/12/2019, 10/15/2022   Influenza Split 11/14/2015, 10/11/2018   Influenza, High Dose Seasonal PF 09/28/2017   Influenza-Unspecified 11/02/2016, 09/03/2020, 10/03/2021   Moderna SARS-COV2 Booster Vaccination 08/20/2020, 03/04/2021   Moderna Sars-Covid-2 Vaccination 02/15/2020, 03/14/2020   Pneumococcal Conjugate-13 09/28/2015   Tdap 11/28/2019       Objective:   Physical Exam BP 130/78 (BP Location: Left Arm, Cuff Size: Large)   Pulse 60   Temp 98.1 F (36.7 C)   Ht 5\' 9"  (1.753 m)   Wt 228 lb (103.4 kg)   SpO2 94%   BMI 33.67 kg/m   SpO2: 94 % O2 Device: Nasal cannula O2 Flow Rate  (L/min): 3 L/min  GENERAL: Well-developed, obese patient in no acute distress.  He is comfortable on nasal cannula O2.  He is fully ambulatory. HEAD: Normocephalic, atraumatic.  EYES: Pupils equal, round, reactive to light.  No scleral icterus.  MOUTH:   Upper and lower dentures.  Oral mucosa moist.  No thrush. NECK: Supple. No thyromegaly. Trachea midline. No JVD.  No adenopathy. PULMONARY: Good air entry bilaterally.  Symmetrical, nonlabored, no adventitious sounds. CARDIOVASCULAR: S1 and S2.  Regular rate and rhythm, NSR, rate 62.  Grade 2/6 mitral regurgitation murmur. Pacemaker on left. GASTROINTESTINAL: Obese, otherwise benign. MUSCULOSKELETAL: No joint deformity, no clubbing, no edema.  NEUROLOGIC: No focal deficits, speech is fluent.  No gait disturbance noted.  Awake, alert and oriented. SKIN: Intact,warm,dry. PSYCH: Mood and behavior appropriate.     Assessment & Plan:     ICD-10-CM   1. Severe persistent asthma without complication  123XX123    Continue Breztri and as needed albuterol Continue Dupixent    2. Asthma-COPD overlap syndrome  J44.89    Medications as above    3. OSA (obstructive sleep apnea)  G47.33 AMB REFERRAL FOR DME    Pulse oximetry, overnight   BiPAP ordered for the patient Will need 3 L O2 bled in ONO on BiPAP plus O2    4. Bronchiectasis without complication (HCC)  A999333    Continue vest physiotherapy twice a day Continue flutter valve use    5. Chronic respiratory failure with hypoxia (HCC)  J96.11    Patient compliant with oxygen at 3 L/min Continue supplementation     Orders Placed This Encounter  Procedures   AMB REFERRAL FOR DME    Referral Priority:   Routine    Referral Type:   Durable Medical Equipment Purchase    Number of Visits Requested:   1   Pulse oximetry, overnight    On BiPAP and 3L O2 & [facility]    Standing Status:  Future    Standing Expiration Date:   03/17/2024   Will see the patient in follow-up in 3  months time he is to contact us prior to that time should any new difficulties arise.  Renold Don, MD Advanced Bronchoscopy PCCM  [facility]    *This note was dictated using voice recognition software/Dragon.  Despite best efforts to proofread, errors can occur which can change the meaning. Any transcriptional errors that result from this process are unintentional and may not be fully corrected at the time of dictation.

## 2023-03-30 ENCOUNTER — Encounter: Payer: Self-pay | Admitting: Pulmonary Disease

## 2023-03-31 ENCOUNTER — Other Ambulatory Visit (HOSPITAL_COMMUNITY): Payer: Self-pay

## 2023-04-03 ENCOUNTER — Other Ambulatory Visit (HOSPITAL_COMMUNITY): Payer: Self-pay

## 2023-04-04 LAB — LAB REPORT - SCANNED
A1c: 7
Albumin, Urine POC: 44.4
Creatinine, POC: 118.4 mg/dL
EGFR: 57
Microalb Creat Ratio: 38

## 2023-04-09 ENCOUNTER — Ambulatory Visit (INDEPENDENT_AMBULATORY_CARE_PROVIDER_SITE_OTHER): Payer: Medicare Other

## 2023-04-09 ENCOUNTER — Emergency Department (HOSPITAL_COMMUNITY): Payer: Medicare Other

## 2023-04-09 ENCOUNTER — Emergency Department (HOSPITAL_COMMUNITY)
Admission: EM | Admit: 2023-04-09 | Discharge: 2023-04-09 | Disposition: A | Discharge status: Home / Self Care | Payer: Medicare Other | Attending: Emergency Medicine | Admitting: Emergency Medicine

## 2023-04-09 ENCOUNTER — Encounter: Payer: Self-pay | Admitting: Emergency Medicine

## 2023-04-09 ENCOUNTER — Telehealth: Payer: Self-pay | Admitting: Pulmonary Disease

## 2023-04-09 ENCOUNTER — Ambulatory Visit
Admission: EM | Admit: 2023-04-09 | Discharge: 2023-04-09 | Disposition: A | Discharge status: Home / Self Care | Payer: Medicare Other | Attending: Nurse Practitioner | Admitting: Nurse Practitioner

## 2023-04-09 ENCOUNTER — Other Ambulatory Visit: Payer: Self-pay

## 2023-04-09 ENCOUNTER — Encounter (HOSPITAL_COMMUNITY): Payer: Self-pay

## 2023-04-09 DIAGNOSIS — W19XXXA Unspecified fall, initial encounter: Secondary | ICD-10-CM | POA: Diagnosis not present

## 2023-04-09 DIAGNOSIS — W010XXA Fall on same level from slipping, tripping and stumbling without subsequent striking against object, initial encounter: Secondary | ICD-10-CM | POA: Diagnosis not present

## 2023-04-09 DIAGNOSIS — Z7901 Long term (current) use of anticoagulants: Secondary | ICD-10-CM | POA: Insufficient documentation

## 2023-04-09 DIAGNOSIS — S2242XA Multiple fractures of ribs, left side, initial encounter for closed fracture: Secondary | ICD-10-CM | POA: Diagnosis not present

## 2023-04-09 DIAGNOSIS — J9809 Other diseases of bronchus, not elsewhere classified: Secondary | ICD-10-CM | POA: Insufficient documentation

## 2023-04-09 DIAGNOSIS — J449 Chronic obstructive pulmonary disease, unspecified: Secondary | ICD-10-CM | POA: Insufficient documentation

## 2023-04-09 DIAGNOSIS — T17998A Other foreign object in respiratory tract, part unspecified causing other injury, initial encounter: Secondary | ICD-10-CM

## 2023-04-09 DIAGNOSIS — J45909 Unspecified asthma, uncomplicated: Secondary | ICD-10-CM | POA: Diagnosis not present

## 2023-04-09 DIAGNOSIS — G4733 Obstructive sleep apnea (adult) (pediatric): Secondary | ICD-10-CM

## 2023-04-09 DIAGNOSIS — R0789 Other chest pain: Secondary | ICD-10-CM

## 2023-04-09 DIAGNOSIS — R0782 Intercostal pain: Secondary | ICD-10-CM

## 2023-04-09 DIAGNOSIS — Z7951 Long term (current) use of inhaled steroids: Secondary | ICD-10-CM | POA: Diagnosis not present

## 2023-04-09 DIAGNOSIS — S51012A Laceration without foreign body of left elbow, initial encounter: Secondary | ICD-10-CM

## 2023-04-09 MED ORDER — OXYCODONE HCL 5 MG PO TABS
5.0000 mg | ORAL_TABLET | ORAL | Status: AC
  Administered 2023-04-09: 5 mg via ORAL
  Filled 2023-04-09: qty 1

## 2023-04-09 MED ORDER — LIDOCAINE 5 % EX PTCH
1.0000 | MEDICATED_PATCH | CUTANEOUS | Status: DC
  Administered 2023-04-09: 1 via TRANSDERMAL
  Filled 2023-04-09: qty 1

## 2023-04-09 MED ORDER — MUPIROCIN 2 % EX OINT: 1.0000 | TOPICAL_OINTMENT | Freq: Two times a day (BID) | CUTANEOUS | 0 refills | Status: DC

## 2023-04-09 MED ORDER — NALOXONE HCL 0.4 MG/ML IJ SOLN
INTRAMUSCULAR | Status: AC
  Filled 2023-04-09: qty 1

## 2023-04-09 MED ORDER — ACETAMINOPHEN 500 MG PO TABS
1000.0000 mg | ORAL_TABLET | Freq: Once | ORAL | Status: AC
  Administered 2023-04-09: 1000 mg via ORAL
  Filled 2023-04-09: qty 2

## 2023-04-09 NOTE — ED Triage Notes (Signed)
[name] today over oxygen cord.  Fell on knees and left elbow.  Skin tear to left elbow.  States he takes blood thinners.

## 2023-04-09 NOTE — ED Provider Notes (Signed)
[place] [facility]MENT AT [facility] Provider Note   CSN: [id number] Arrival date & time: 04/09/23  1645     History  Chief Complaint  Patient presents with   Shortness of Breath    Left rib fx-sent from urgent care    Clarence Dawson is a 77 y.o. male.  77 year old male with a history of COPD on 3 L home oxygen, asthma, OSA, who presents to the emergency department with chest discomfort.  Patient reports that he fell after tripping over his oxygen tubing and struck his chest during his fall.  Since then has had persistent mild chest pain.  Went to urgent care who performed an x-ray that showed possible left-sided rib fractures.  Reports the pain is mild but they referred him to the emergency department because of his comorbidities and concerns for complications from this.  Denies any head strike or LOC.  Did not have any preceding symptoms before the fall.  No other injuries as result of the fall and only hurts on the left side of his chest.       Home Medications Prior to Admission medications   Medication Sig Start Date End Date Taking? Authorizing Provider  acetaminophen (TYLENOL) 500 MG tablet Take 500 mg by mouth 3 (three) times daily.   Yes [provider]  albuterol (PROVENTIL HFA;VENTOLIN HFA) 108 (90 Base) MCG/ACT inhaler Inhale 2 puffs into the lungs every 4 (four) hours as needed for shortness of breath (only if you can't catch your breath/ asthma).   Yes [provider]  albuterol (PROVENTIL) (2.5 MG/3ML) 0.083% nebulizer solution Take 3 mLs (2.5 mg total) by nebulization every 6 (six) hours as needed for wheezing or shortness of breath. 02/04/23  Yes Salena Saner, MD  atorvastatin (LIPITOR) 20 MG tablet Take 20 mg by mouth at bedtime. 04/27/20  Yes [provider]  bisoprolol (ZEBETA) 5 MG tablet TAKE 1 AND 1/2 TABLET BY MOUTH TWICE DAILY. Patient taking differently: Take 5 mg by mouth See admin instructions. Take 1 & 1/2  tablets by mouth twice daily 04/17/17  Yes Newman Nip, NP  BREZTRI AEROSPHERE 160-9-4.8 MCG/ACT AERO INHALE 2 PUFFS INTO THE LUNGS (IN THE MORNING AND AT BEDTIME). Patient taking differently: Inhale 2 puffs into the lungs 2 (two) times daily. 10/02/22  Yes Salena Saner, MD  Carboxymethylcellul-Glycerin (LUBRICATING EYE DROPS OP) Place 1 drop into the right eye daily as needed (dry eyes).   Yes [provider]  diltiazem (CARTIA XT) 120 MG 24 hr capsule TAKE (1) CAPSULE BY MOUTH DAILY, MAY TAKE A EXTRA CAPSULE DAILY FOR BREAKTHROUGH AFIB. Patient taking differently: Take 120 mg by mouth See admin instructions. Take 1 capsule by mouth ,may take an extra capsule for breakthrough afib 12/11/22  Yes Marinus Maw, MD  dofetilide (TIKOSYN) 500 MCG capsule Take 1 capsule (500 mcg total) by mouth 2 (two) times daily. 01/27/23  Yes Fenton, Clint R, PA  Dupilumab (DUPIXENT) 300 MG/2ML SOPN Inject 300 mg into the skin every 14 (fourteen) days. 11/05/22  Yes Salena Saner, MD  EPINEPHrine 0.3 mg/0.3 mL IJ SOAJ injection Inject 0.3 mg into the muscle as needed for anaphylaxis. 11/05/20  Yes Salena Saner, MD  furosemide (LASIX) 40 MG tablet TAKE 1 TABLET BY MOUTH DAILY 07/09/22  Yes Marinus Maw, MD  glipiZIDE (GLUCOTROL XL) 5 MG 24 hr tablet Take 5 mg by mouth daily before breakfast.   Yes [provider]  hydrocortisone  cream 0.5 % Apply 1 Application topically daily as needed for itching.   Yes [provider]  JARDIANCE 25 MG TABS tablet Take 25 mg by mouth daily before breakfast. 04/11/21  Yes [provider]  levothyroxine (SYNTHROID) 137 MCG tablet Take 150 mcg by mouth daily before breakfast. 06/27/22  Yes [provider]  losartan (COZAAR) 50 MG tablet Take 50 mg by mouth at bedtime. 04/27/20  Yes [provider]  metFORMIN (GLUCOPHAGE-XR) 500 MG 24 hr tablet Take 500 mg by mouth in the morning and at bedtime.   Yes [provider]  Multiple Vitamin (MULTIVITAMIN) tablet Take 1 tablet by mouth daily.   Yes [provider]  mupirocin ointment (BACTROBAN) 2 % Apply 1 Application topically 2 (two) times daily. 04/09/23  Yes Valentino Nose, NP  omeprazole (PRILOSEC OTC) 20 MG tablet Take 20 mg by mouth every morning.   Yes [provider]  rivaroxaban (XARELTO) 20 MG TABS tablet Take 20 mg by mouth at bedtime.   Yes [provider]  Respiratory Therapy Supplies (FLUTTER) DEVI Use as directed 10/10/19   Salena Saner, MD      Allergies    Food, Iodinated contrast media, Shellfish allergy, Goat-derived products, Prednisone, Diclofenac sodium, Metformin and related, Vancomycin, and Voltaren [diclofenac sodium]    Review of Systems   Review of Systems  Physical Exam Updated Vital Signs BP (!) 144/83   Pulse 99   Temp 98.1 F (36.7 C) (Oral)   Resp 20   Ht  (1.753 m)   Wt 103.4 kg   SpO2 95%   BMI 33.66 kg/m  Physical Exam Vitals and nursing note reviewed.  Constitutional:      General: He is not in acute distress.    Appearance: He is well-developed.     Comments: Well-appearing.  Walked into the exam room without difficulty. On baseline 3 L nasal cannula.  HENT:     Head: Normocephalic and atraumatic.     Right Ear: External ear normal.     Left Ear: External ear normal.     Nose: Nose normal.  Eyes:     Extraocular Movements: Extraocular movements intact.     Conjunctiva/sclera: Conjunctivae normal.     Pupils: Pupils are equal, round, and reactive to light.  Cardiovascular:     Rate and Rhythm: Normal rate and regular rhythm.     Heart sounds: Normal heart sounds.  Pulmonary:     Effort: Pulmonary effort is normal. No respiratory distress.     Comments: Left lower lobe diminished Abdominal:     General: There is no distension.     Palpations: Abdomen is soft. There is no mass.     Tenderness: There is no abdominal tenderness. There is no  guarding.  Musculoskeletal:     Cervical back: Normal range of motion and neck supple.  Skin:    General: Skin is warm and dry.  Neurological:     Mental Status: He is alert. Mental status is at baseline.  Psychiatric:        Mood and Affect: Mood normal.        Behavior: Behavior normal.     ED Results / Procedures / Treatments   Labs (all labs ordered are listed, but only abnormal results are displayed) Labs Reviewed - No data to display  EKG None  Radiology CT Chest Wo Contrast  Result Date: 04/09/2023 CLINICAL DATA:  Rib fracture. Rule out pneumothorax. Left posterior  rib pain. EXAM: CT CHEST WITHOUT CONTRAST TECHNIQUE: Multidetector CT imaging of the chest was performed following the standard protocol without IV contrast. RADIATION DOSE REDUCTION: This exam was performed according to the departmental dose-optimization program which includes automated exposure control, adjustment of the mA and/or kV according to patient size and/or use of iterative reconstruction technique. COMPARISON:  February 28, 2021 FINDINGS: Cardiovascular: Normal heart size. Calcific atherosclerotic disease of the coronary arteries and aorta. Mediastinum/Nodes: No enlarged mediastinal or axillary lymph nodes. Thyroid gland, trachea, and esophagus demonstrate no significant findings. Lungs/Pleura: Upper lobe predominant severe emphysema. Near complete collapse of the left lower lobe. Upper Abdomen: No acute abnormality. Musculoskeletal: No evidence of rib fractures. Spondylosis of the thoracolumbar spine. IMPRESSION: 1. No evidence of rib fractures. 2. Near complete collapse of the left lower lobe. Endobronchial evaluation may be considered. 3. Upper lobe predominant severe emphysema. 4. Calcific atherosclerotic disease of the coronary arteries and aorta. Aortic Atherosclerosis (ICD10-I70.0) and Emphysema (ICD10-J43.9). Electronically Signed   By: Ted Mcalpine M.D.   On: 04/09/2023 18:39   DG Ribs Unilateral  W/Chest Left  Result Date: 04/09/2023 CLINICAL DATA:  Fall with left-sided rib cage pain EXAM: LEFT RIBS AND CHEST - 3+ VIEW COMPARISON:  09/19/2019 FINDINGS: Single-view chest demonstrates left-sided pacing device with right atrial and ventricular leads. Emphysema and bronchitic changes. Streaky left lung base opacity. Stable cardiomediastinal silhouette with aortic atherosclerosis. Similar enlarged pulmonary conus. No visible pneumothorax. Left rib series demonstrates age indeterminate left fourth, fifth, and sixth posterolateral rib fractures IMPRESSION: 1. Age indeterminate left fourth, fifth and sixth posterolateral rib fractures. No visible pneumothorax. 2. Emphysema and bronchitic changes. Streaky left lung base opacity may be due to atelectasis or scarring. Electronically Signed   By: Jasmine Pang M.D.   On: 04/09/2023 16:11    Procedures Procedures   Medications Ordered in ED Medications  oxyCODONE (Oxy IR/ROXICODONE) immediate release tablet 5 mg (5 mg Oral Given 04/09/23 1834)  acetaminophen (TYLENOL) tablet 1,000 mg (1,000 mg Oral Given 04/09/23 1834)    ED Course/ Medical Decision Making/ A&P                             Medical Decision Making Amount and/or Complexity of Data Reviewed Radiology: ordered.  Risk OTC drugs. Prescription drug management.   LORENA ZINS is a 77 y.o. male with comorbidities that complicate the patient evaluation including COPD on 3 L home oxygen, asthma, OSA, who presents to the emergency department with chest pain after a fall and abnormal chest x-ray  Initial Ddx:  Rib fractures, pneumothorax, mechanical fall  MDM:  Based on x-ray from urgent care today appears to have some rib fractures.  They are worried about a pneumothorax for the patient so we will obtain a CT of the chest at this time.  Not having any additional symptoms do not feel that labs are warranted currently.  Plan:  CT chest without contrast  ED Summary/Re-evaluation:   Patient underwent CT chest without contrast which did not show any rib fractures.  It did show atelectasis and collapse of the left lower lobe.  Patient was overall feeling very well and reports no shortness of breath at this time.  Did discuss him with pulmonology (Dr. Tonia Brooms) who has reviewed the CT scans and feels the patient likely has a mucous plug.  He recommended him be discharged home with a flutter valve and pulmonology follow-up in 1 week and do not  feel that the patient warranted any acute interventions.  Patient was given a flutter valve and instructed on how to use it as well as incentive spirometer and pain medication and was discharged home with instructions to follow-up with pulmonary and his primary doctor.  This patient presents to the ED for concern of complaints listed in HPI, this involves an extensive number of treatment options, and is a complaint that carries with it a high risk of complications and morbidity. Disposition including potential need for admission considered.   Dispo: DC Home. Return precautions discussed including, but not limited to, those listed in the AVS. Allowed pt time to ask questions which were answered fully prior to dc.  Records reviewed Outpatient Clinic Notes I independently reviewed the following imaging with scope of interpretation limited to determining acute life threatening conditions related to emergency care: CT Chest and agree with the radiologist interpretation with the following exceptions: none I personally reviewed and interpreted cardiac monitoring: normal sinus rhythm  I personally reviewed and interpreted the pt's EKG: see above for interpretation  I have reviewed the patients home medications and made adjustments as needed Consults:  Pulmonology Social Determinants of health:  Elderly  Final Clinical Impression(s) / ED Diagnoses Final diagnoses:  Chest wall pain  Fall, initial encounter  Mucus plug in respiratory tract    Rx /  DC Orders ED Discharge Orders     None         Rondel BatonPaterson, Zaydenn Balaguer C, MD 04/10/23 [phone]

## 2023-04-09 NOTE — ED Triage Notes (Signed)
Pt fell and was seen at urgent care, xray revealed left rib fx, pt has COPD, on 3L oxygen continuous, pt was sent here to r/o pneumothorax

## 2023-04-09 NOTE — ED Triage Notes (Signed)
Patient states he is having pain in center of chest and left side from fall.  States he is concerned about a rib fx.

## 2023-04-09 NOTE — Telephone Encounter (Signed)
PCCM:  Patient at [facility] ED. Will need short term Ct follow up and appt with Dr. Jayme Cloud  CT in ED with LLL partial collapse. Recommended flutter valve and follow up outpatient.  Possible need for op bronch if repeat ct shows persistent collapse  Josephine Igo, DO [facility] 04/09/2023 8:01 PM

## 2023-04-09 NOTE — ED Notes (Signed)
Patient is being discharged from the Urgent Care and sent to the Emergency Department via personal vehicle . Per Cathlean Marseilles, patient is in need of higher level of care due to multiple rib fractures. Patient is aware and verbalizes understanding of plan of care.  Vitals:   04/09/23 1523 04/09/23 1528  BP: (!) 144/70   Pulse: 62   Resp: 20   Temp: 98 F (36.7 C)   SpO2:  93%

## 2023-04-09 NOTE — Discharge Instructions (Addendum)
Please clean the skin tears twice daily with mild soap and water Then apply a thin layer of the prescription antibacterial ointment twice daily and cover with nonadherent gauze  Do this each daily until it heals over, then you can stop Seek care if the area becomes swollen, raised, or red  The rib xray today shows multiple rib fractures; please go to the ER for further evaluation and management

## 2023-04-09 NOTE — ED Provider Notes (Signed)
[facility]    CSN: [id number] Arrival date & time: 04/09/23  1517      History   Chief Complaint No chief complaint on file.   HPI Clarence Dawson is a 77 y.o. male.   Patient presents today for rib cage pain that began earlier today after a fall.  Reports he tripped over his oxygen cord at home and fell with his left arm tucked underneath his rib cage. He also sustained a couple of skin tears on his left elbow that have been bleeding.  He is now having pain in his right mid back and left mid chest.  He is concerned about rib fractures.   Patient denies new shortness of breath or cough.  He has a history of asthma and is oxygen dependent at baseline.  He also has a pacemaker and history of atrial fibrillation on blood thinners.    Past Medical History:  Diagnosis Date   Allergic rhinitis    Aortic valve disorder    Asthma    since childhood- seasonal allergies induced   Cancer    Skin cancer- squamous, basal   Carotid artery stenosis    Essential hypertension    Full dentures    GERD (gastroesophageal reflux disease)    H/O hiatal hernia    Hemorrhage of rectum    Hyperlipidemia    Hypothyroidism    Male circumcision    OSA (obstructive sleep apnea)    Osteoarthritis    Pacemaker    Oct 2005 in [place].   PAF (paroxysmal atrial fibrillation)    Pneumonia    "several Times" 2015 last time   Primary localized osteoarthritis of right knee 08/11/2017   RBBB (right bundle branch block)    Sinoatrial node dysfunction    Syncope    Tricuspid valve disorder    Type 2 diabetes mellitus    Type II    Patient Active Problem List   Diagnosis Date Noted   Hypercoagulable state due to persistent atrial fibrillation 01/02/2020   Chronic diastolic heart failure 11/22/2019   Paroxysmal atrial fibrillation 09/22/2019   Atrial fibrillation with RVR    Lobar pneumonia 09/21/2019   Acute respiratory failure with hypoxia 09/21/2019   Acute on chronic respiratory  failure 09/18/2019   Severe sepsis    Elevated lactic acid level    GERD (gastroesophageal reflux disease)    Chronic bronchitis 02/09/2019   CAP (community acquired pneumonia) 03/03/2018   Primary localized osteoarthritis of right knee 08/11/2017   Asthma with acute exacerbation 12/08/2016   Chest pain 10/20/2016   HTN (hypertension) 10/20/2016   Morbid obesity due to excess calories 06/24/2016   Severe persistent asthma 02/12/2016   Upper airway cough syndrome 01/24/2016   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   Persistent atrial fibrillation    Syncope    Hyperthyroidism    Hyperlipidemia    RBBB (right bundle branch block)    Tricuspid valve disorder    Aortic valve disorder    Carotid artery stenosis    Type 2 diabetes mellitus    Pacemaker    OSA (obstructive sleep apnea)    Arthritis    Sinoatrial node dysfunction (HCC)    PVC's (premature ventricular contractions) 07/17/2011   Essential hypertension, benign 01/24/2011   Premature ventricular contractions 01/24/2011   PPM-St.[facility]ude 01/24/2011    Past Surgical History:  Procedure Laterality Date   A-V CARDIAC PACEMAKER INSERTION     Sick sinus syndrome DDR pacer   Arthropathy Right 2005  Rebuilding of left thumb and joint    CARDIAC CATHETERIZATION     CARDIAC ELECTROPHYSIOLOGY STUDY AND ABLATION  09/2008   for pvcs, Dr. Vesta Mixer   CARDIOVERSION N/A 12/18/2016   Procedure: CARDIOVERSION;  Surgeon: Chrystie Nose, MD;  Location: [facility] ENDOSCOPY;  Service: Cardiovascular;  Laterality: N/A;   CARDIOVERSION N/A 09/05/2021   Procedure: CARDIOVERSION;  Surgeon: Sande Rives, MD;  Location: [facility] ENDOSCOPY;  Service: Cardiovascular;  Laterality: N/A;   CARDIOVERSION N/A 08/06/2022   Procedure: CARDIOVERSION;  Surgeon: Little Ishikawa, MD;  Location: [facility] ENDOSCOPY;  Service: Cardiovascular;  Laterality: N/A;   CARPAL TUNNEL RELEASE  1994   right wrist   CARPAL TUNNEL RELEASE  05/04/2012   Procedure: CARPAL TUNNEL RELEASE;   Surgeon: Nicki Reaper, MD;  Location: [facility];  Service: Orthopedics;  Laterality: Left;   CARPOMETACARPEL SUSPENSION PLASTY Right 11/16/2014   Procedure: SUSPENSIONPLASTY RIGHT THUMB TENDON TRANSFER ABDUCTOR POLLICUS LONGUS EXCISION TRAPEZIUM;  Surgeon: Cindee Salt, MD;  Location: [facility];  Service: Orthopedics;  Laterality: Right;   CHOLECYSTECTOMY  1994   CIRCUMCISION     COLONOSCOPY N/A 03/14/2013   Procedure: COLONOSCOPY;  Surgeon: Corbin Ade, MD;  Location: [facility];  Service: Endoscopy;  Laterality: N/A;  8:15 AM   EYE SURGERY     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-[facility]   EYE SURGERY  2012   Left eye Corneal transplant- partial- Cataract   FLEXIBLE BRONCHOSCOPY Right 06/23/2019   Procedure: FLEXIBLE BRONCHOSCOPY RIGHT;  Surgeon: Shane Crutch, MD;  Location: [facility] ORS;  Service: Pulmonary;  Laterality: Right;   GALLBLADDER SURGERY  12/01/2006   HAND TENDON SURGERY Left late 1990's   thumb   HEMORROIDECTOMY  2003   INJECTION KNEE Left 08/26/2017   Procedure: LEFT KNEE INJECTION;  Surgeon: Salvatore Marvel, MD;  Location: [facility] OR;  Service: Orthopedics;  Laterality: Left;   left Knee Arthroscopy     [facility]   PARTIAL KNEE ARTHROPLASTY  11/22/2012   Procedure: UNICOMPARTMENTAL KNEE;  Surgeon: Nilda Simmer, MD;  Location: [facility] OR;  Service: Orthopedics;  Laterality: Left;  left unicompartmental knee arthroplasty   PERMANENT PACEMAKER GENERATOR CHANGE N/A 01/12/2013   Procedure: PERMANENT PACEMAKER GENERATOR CHANGE;  Surgeon: Marinus Maw, MD;  Location: [facility] CATH LAB;  Service: Cardiovascular;  Laterality: N/A;   PPM GENERATOR CHANGEOUT N/A 07/07/2022   Procedure: PPM GENERATOR CHANGEOUT;  Surgeon: Marinus Maw, MD;  Location: [facility] INVASIVE CV LAB;  Service: Cardiovascular;  Laterality: N/A;   Rotator cuff Surgery  2001   Right shoulder   TONSILLECTOMY     TOTAL KNEE ARTHROPLASTY Right 08/26/2017   Procedure:  TOTAL KNEE ARTHROPLASTY;  Surgeon: Salvatore Marvel, MD;  Location: [facility] OR;  Service: Orthopedics;  Laterality: Right;   varicose vein reduction         Home Medications    Prior to Admission medications   Medication Sig Start Date End Date Taking? Authorizing Provider  mupirocin ointment (BACTROBAN) 2 % Apply 1 Application topically 2 (two) times daily. 04/09/23  Yes Valentino Nose, NP  acetaminophen (TYLENOL) 500 MG tablet Take 500 mg by mouth 4 (four) times daily.    [provider]  albuterol (PROVENTIL HFA;VENTOLIN HFA) 108 (90 Base) MCG/ACT inhaler Inhale 2 puffs into the lungs every 4 (four) hours as needed for shortness of breath (only if you can't catch your breath/ asthma).    [provider]  albuterol (PROVENTIL) (2.5 MG/3ML)  0.083% nebulizer solution Take 3 mLs (2.5 mg total) by nebulization every 6 (six) hours as needed for wheezing or shortness of breath. 02/04/23   Salena Saner, MD  atorvastatin (LIPITOR) 20 MG tablet Take 20 mg by mouth at bedtime. 04/27/20   [provider]  bisoprolol (ZEBETA) 5 MG tablet TAKE 1 AND 1/2 TABLET BY MOUTH TWICE DAILY. 04/17/17   Newman Nip, NP  BREZTRI AEROSPHERE 160-9-4.8 MCG/ACT AERO INHALE 2 PUFFS INTO THE LUNGS (IN THE MORNING AND AT BEDTIME). 10/02/22   Salena Saner, MD  Carboxymethylcellul-Glycerin (LUBRICATING EYE DROPS OP) Place 1 drop into the right eye daily as needed (dry eyes).    [provider]  diltiazem (CARTIA XT) 120 MG 24 hr capsule TAKE (1) CAPSULE BY MOUTH DAILY, MAY TAKE A EXTRA CAPSULE DAILY FOR BREAKTHROUGH AFIB. 12/11/22   Marinus Maw, MD  dofetilide (TIKOSYN) 500 MCG capsule Take 1 capsule (500 mcg total) by mouth 2 (two) times daily. 01/27/23   Fenton, Clint R, PA  Dupilumab (DUPIXENT) 300 MG/2ML SOPN Inject 300 mg into the skin every 14 (fourteen) days. 11/05/22   Salena Saner, MD  EPINEPHrine 0.3 mg/0.3 mL IJ SOAJ injection Inject 0.3 mg into the muscle as  needed for anaphylaxis. 11/05/20   Salena Saner, MD  furosemide (LASIX) 40 MG tablet TAKE 1 TABLET BY MOUTH DAILY 07/09/22   Marinus Maw, MD  glipiZIDE (GLUCOTROL XL) 5 MG 24 hr tablet Take 5 mg by mouth daily before breakfast.    [provider]  hydrocortisone cream 0.5 % Apply 1 Application topically daily as needed for itching.    [provider]  JARDIANCE 25 MG TABS tablet Take 25 mg by mouth daily before breakfast. 04/11/21   [provider]  levothyroxine (SYNTHROID) 137 MCG tablet Take 137 mcg by mouth daily before breakfast. 06/27/22   [provider]  losartan (COZAAR) 50 MG tablet Take 50 mg by mouth at bedtime. 04/27/20   [provider]  metFORMIN (GLUCOPHAGE-XR) 500 MG 24 hr tablet Take 500 mg by mouth in the morning and at bedtime.    [provider]  Multiple Vitamin (MULTIVITAMIN) tablet Take 1 tablet by mouth daily.    [provider]  omeprazole (PRILOSEC OTC) 20 MG tablet Take 20 mg by mouth every morning.    [provider]  Respiratory Therapy Supplies (FLUTTER) DEVI Use as directed 10/10/19   Salena Saner, MD  rivaroxaban (XARELTO) 20 MG TABS tablet Take 20 mg by mouth at bedtime.    [provider]    Family History Family History  Problem Relation Age of Onset   Liver cancer Mother    Pancreatic cancer Mother    Colon cancer Mother    Hypertension Father    Stroke Father    Other Father 32       Sudden Cardiac death   Heart attack Father    Cancer Sister        brain   Diabetes Sister    Colon cancer Maternal Aunt    Colon polyps Neg Hx     Social History Social History   Tobacco Use   Smoking status: Former    Packs/day: 3.00    Years: 25.00    Additional pack years: 0.00    Total pack years: 75.00    Types: Cigarettes    Start date: 02/20/1958    Quit date: 02/12/1984    Years since quitting: 39.1  Smokeless tobacco: Former    Types: Chew   Tobacco  comments:    Former smoker 08/30/2021  Vaping Use   Vaping Use: Never used  Substance Use Topics   Alcohol use: Yes    Alcohol/week: 2.0 - 4.0 standard drinks of alcohol    Types: 1 - 2 Glasses of wine, 1 - 2 Cans of beer per week    Comment: 1 glass of wine or beer 3 times a week 12/26/22   Drug use: No     Allergies   Food, Iodinated contrast media, Shellfish allergy, Goat-derived products, Prednisone, Diclofenac sodium, Metformin and related, Vancomycin, and Voltaren [diclofenac sodium]   Review of Systems Review of Systems Per HPI  Physical Exam Triage Vital Signs ED Triage Vitals  Enc Vitals Group     BP 04/09/23 1523 (!) 144/70     Pulse Rate 04/09/23 1523 62     Resp 04/09/23 1523 20     Temp 04/09/23 1523 98 F (36.7 C)     Temp Source 04/09/23 1523 Oral     SpO2 04/09/23 1528 93 %     Weight --      Height --      Head Circumference --      Peak Flow --      Pain Score 04/09/23 1526 0     Pain Loc --      Pain Edu? --      Excl. in GC? --    No data found.  Updated Vital Signs BP (!) 144/70 (BP Location: Right Arm)   Pulse 62   Temp 98 F (36.7 C) (Oral)   Resp 20   SpO2 93%   Visual Acuity Right Eye Distance:   Left Eye Distance:   Bilateral Distance:    Right Eye Near:   Left Eye Near:    Bilateral Near:     Physical Exam Vitals and nursing note reviewed.  Constitutional:      General: He is not in acute distress.    Appearance: Normal appearance. He is not ill-appearing or toxic-appearing.  HENT:     Head: Normocephalic and atraumatic.  Eyes:     General: No scleral icterus.    Extraocular Movements: Extraocular movements intact.  Cardiovascular:     Rate and Rhythm: Normal rate and regular rhythm.  Pulmonary:     Effort: Pulmonary effort is normal. No respiratory distress.     Breath sounds: Normal breath sounds. No wheezing, rhonchi or rales.  Chest:     Comments: No bruising, swelling, or redness to rib cage.  No obvious  deformity noted. Abdominal:     General: Abdomen is flat. Bowel sounds are normal. There is no distension.     Palpations: Abdomen is soft.  Musculoskeletal:     Cervical back: Normal range of motion and neck supple.  Lymphadenopathy:     Cervical: No cervical adenopathy.  Skin:    General: Skin is warm and dry.     Coloration: Skin is not jaundiced or pale.     Findings: No erythema or rash.          Comments: 2 skin tears to left elbow in area marked; no surrounding erythema, edema, or active bleeding  Neurological:     Mental Status: He is alert and oriented to person, place, and time.     Motor: No weakness.  Psychiatric:        Mood and Affect: Mood normal.  Behavior: Behavior normal.      UC Treatments / Results  Labs (all labs ordered are listed, but only abnormal results are displayed) Labs Reviewed - No data to display  EKG   Radiology DG Ribs Unilateral W/Chest Left  Result Date: 04/09/2023 CLINICAL DATA:  Fall with left-sided rib cage pain EXAM: LEFT RIBS AND CHEST - 3+ VIEW COMPARISON:  09/19/2019 FINDINGS: Single-view chest demonstrates left-sided pacing device with right atrial and ventricular leads. Emphysema and bronchitic changes. Streaky left lung base opacity. Stable cardiomediastinal silhouette with aortic atherosclerosis. Similar enlarged pulmonary conus. No visible pneumothorax. Left rib series demonstrates age indeterminate left fourth, fifth, and sixth posterolateral rib fractures IMPRESSION: 1. Age indeterminate left fourth, fifth and sixth posterolateral rib fractures. No visible pneumothorax. 2. Emphysema and bronchitic changes. Streaky left lung base opacity may be due to atelectasis or scarring. Electronically Signed   By: Jasmine PangKim  Fujinaga M.D.   On: 04/09/2023 16:11    Procedures Procedures (including critical care time)  Medications Ordered in UC Medications - No data to display  Initial Impression / Assessment and Plan / UC Course  I  have reviewed the triage vital signs and the nursing notes.  Pertinent labs & imaging results that were available during my care of the patient were reviewed by me and considered in my medical decision making (see chart for details).   Patient is well-appearing, normotensive, afebrile, not tachycardic, not tachypneic, oxygenating well on room air.    1. Fall, initial encounter 2. Skin tear of left elbow without complication, initial encounter Skin care today and skin care at home discussed with patient as well as wound care discussed Start topical mupirocin at home Seek care for worsening symptoms despite treatment  3. Closed fracture of multiple ribs of left side, initial encounter Rib x-ray today shows fractures of left fourth, fifth, and sixth posterior lateral ribs Patient is oxygen dependent at baseline and has multiple comorbidities, he is at high risk for spontaneous pneumothorax and flail chest Recommended further evaluation and management emergency room Patient is in agreement to plan Patient is safe to transport by private vehicle   The patient was given the opportunity to ask questions.  All questions answered to their satisfaction.  The patient is in agreement to this plan.    Final Clinical Impressions(s) / UC Diagnoses   Final diagnoses:  Fall, initial encounter  Skin tear of left elbow without complication, initial encounter  Closed fracture of multiple ribs of left side, initial encounter     Discharge Instructions      Please clean the skin tears twice daily with mild soap and water Then apply a thin layer of the prescription antibacterial ointment twice daily and cover with nonadherent gauze  Do this each daily until it heals over, then you can stop Seek care if the area becomes swollen, raised, or red  The rib xray today shows multiple rib fractures; please go to the ER for further evaluation and management    ED Prescriptions     Medication Sig Dispense  Auth. Provider   mupirocin ointment (BACTROBAN) 2 % Apply 1 Application topically 2 (two) times daily. 22 g Valentino NoseMartinez, Shaune Westfall A, NP      PDMP not reviewed this encounter.   Valentino NoseMartinez, Tylicia Sherman A, NP 04/09/23 [phone]

## 2023-04-09 NOTE — Discharge Instructions (Signed)
You were seen for your rib pain in the emergency department.  It is likely that you have a bruised rib.  At home, please take Tylenol and use lidocaine patches we have prescribed you for your pain.  Please also use the incentive spirometer we have given you to prevent any pneumonias.  You also were found to have a large mucous plug that is causing a small collapse of part of the left lung so please use the flutter valve that you were given to get rid of the mucous plug.   Check your MyChart online for the results of any tests that had not resulted by the time you left the emergency department.   Follow-up with your primary doctor in 2-3 days regarding your visit.  Follow-up with your pulmonologist in 1 week.  Return immediately to the emergency department if you experience any of the following: Difficulty breathing, fever, severe pain, or any other concerning symptoms.    Thank you for visiting our Emergency Department. It was a pleasure taking care of you today.

## 2023-04-10 NOTE — Telephone Encounter (Signed)
I spoke with the patient and scheduled him an appt for 4/16 at 2:30pm.   Nothing further needed.

## 2023-04-10 NOTE — Telephone Encounter (Addendum)
We can set him up and nodule clinic spot.  He has done this previously and this responded to vest physiotherapy.  Given that it is uncertain whether he had rib fractures, he needs to be using an Acapella flutter valve at least 4 times a day.  He needs to be faithful with it.

## 2023-04-10 NOTE — Telephone Encounter (Signed)
Pending appt 06/25/2023--no sooner availability.  Patient would like sooner appt. He would also like Dr. Jayme Cloud to review imaging from 04/09/2023.  Dr. Jayme Cloud, please advise. Thanks

## 2023-04-14 ENCOUNTER — Ambulatory Visit: Payer: Medicare Other | Admitting: Pulmonary Disease

## 2023-04-14 ENCOUNTER — Encounter: Payer: Self-pay | Admitting: Pulmonary Disease

## 2023-04-14 VITALS — BP 124/68 | HR 60 | Temp 98.1°F | Ht 69.0 in | Wt 226.2 lb

## 2023-04-14 DIAGNOSIS — J4489 Other specified chronic obstructive pulmonary disease: Secondary | ICD-10-CM | POA: Diagnosis not present

## 2023-04-14 DIAGNOSIS — J471 Bronchiectasis with (acute) exacerbation: Secondary | ICD-10-CM

## 2023-04-14 DIAGNOSIS — J9811 Atelectasis: Secondary | ICD-10-CM | POA: Diagnosis not present

## 2023-04-14 MED ORDER — CEFDINIR 300 MG PO CAPS: 300.0000 mg | ORAL_CAPSULE | Freq: Two times a day (BID) | ORAL | 0 refills | Status: AC

## 2023-04-14 NOTE — Progress Notes (Signed)
Subjective:    Patient ID: [name], male    DOB: 1946-06-17, 77 y.o.   MRN: [id number] Patient Care Team: Carylon Perches, MD as PCP - General (Internal Medicine) Marinus Maw, MD as PCP - Cardiology (Cardiology) Marinus Maw, MD as PCP - Electrophysiology (Cardiology) Salena Saner, MD as Consulting Physician (Pulmonary Disease)  Chief Complaint  Patient presents with   Follow-up    Cough with brownish red sputum started 2 weeks ago. Fell on 04/09/2023. SOB. No wheezing.    HPI Clarence Dawson is a 77 year old former smoker (8 PY) who presents for follow-up after emergency room visit at [facility] on 09 April 2023.  Patient has a history as noted below.  Followed here for COPD with chronic hypoxic respiratory failure, asthma, obstructive sleep apnea and bronchiectasis.  The patient fell at home after tripping over his oxygen tubing went to urgent care where chest x-ray showed possible left-sided rib fractures.  This would require follow-up at the ED.  The patient then underwent a CT scan of the chest that showed collapse of the left lower lobe similar to what he had had back in September 2021.  There was mucous plugging noted.  This is in keeping with his underlying asthma and bronchiectasis.  Prior to his fall the patient had "slacked off" on his vest physiotherapy at home.  He has now resumed that since his fall and doing it religiously twice a day.  He is also using an Acapella flutter valve.  He was not given any medications in the ED advised to take Tylenol for pain.  Prior to his fall he also was having a change of sputum color to brown and since his fall it has been brown with some occasional streaky hemoptysis.  He notes his change of color for approximately 2 weeks.  He is on Xarelto and Tikosyn for atrial fibrillation.  He has not had any fevers, chills or sweats.  Has had some malaise.  He notes that he is expectorating well after using the vest physiotherapy.  He does not endorse  any other symptomatology.   Review of Systems A 10 point review of systems was performed and it is as noted above otherwise negative.  Patient Active Problem List   Diagnosis Date Noted   Hypercoagulable state due to persistent atrial fibrillation 01/02/2020   Chronic diastolic heart failure 11/22/2019   Paroxysmal atrial fibrillation 09/22/2019   Atrial fibrillation with RVR    Lobar pneumonia 09/21/2019   Acute respiratory failure with hypoxia 09/21/2019   Acute on chronic respiratory failure 09/18/2019   Severe sepsis    Elevated lactic acid level    GERD (gastroesophageal reflux disease)    Chronic bronchitis 02/09/2019   CAP (community acquired pneumonia) 03/03/2018   Primary localized osteoarthritis of right knee 08/11/2017   Asthma with acute exacerbation 12/08/2016   Chest pain 10/20/2016   HTN (hypertension) 10/20/2016   Morbid obesity due to excess calories 06/24/2016   Severe persistent asthma 02/12/2016   Upper airway cough syndrome 01/24/2016   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   Persistent atrial fibrillation    Syncope    Hyperthyroidism    Hyperlipidemia    RBBB (right bundle branch block)    Tricuspid valve disorder    Aortic valve disorder    Carotid artery stenosis    Type 2 diabetes mellitus    Pacemaker    OSA (obstructive sleep apnea)    Arthritis    Sinoatrial node dysfunction (  HCC)    PVC's (premature ventricular contractions) 07/17/2011   Essential hypertension, benign 01/24/2011   Premature ventricular contractions 01/24/2011   PPM-[facility] 01/24/2011   Social History   Tobacco Use   Smoking status: Former    Packs/day: 3.00    Years: 25.00    Additional pack years: 0.00    Total pack years: 75.00    Types: Cigarettes    Start date: 02/20/1958    Quit date: 02/12/1984    Years since quitting: 39.1   Smokeless tobacco: Former    Types: Chew   Tobacco comments:    Former smoker 08/30/2021  Substance Use Topics   Alcohol use: Yes     Alcohol/week: 2.0 - 4.0 standard drinks of alcohol    Types: 1 - 2 Glasses of wine, 1 - 2 Cans of beer per week    Comment: 1 glass of wine or beer 3 times a week 12/26/22   Allergies  Allergen Reactions   Food Anaphylaxis and Shortness Of Breath    TREE NUTS   Iodinated Contrast Media Hives and Shortness Of Breath    Patient states hives to throat closing. (01/15/17: patient states this reaction was "about 20 years ago" with possibly an IVP.  He now says high doses of prednisone "throw me into AFib."  He has tolerated CT arthrograms with Benadrly  PO one hour before injection.  Clarence Dawson, Clarence Dawson)   Shellfish Allergy Anaphylaxis and Shortness Of Breath    To shellfish, crabs.  Makes him feel like "things are crawling all over" me.  Denies airway issues with these foods.  Clarence Dawson, Clarence Dawson 01/15/17)   Goat-Derived Products Hives    GOAT CHEESE    Prednisone Palpitations    PRECIPITATES A-FIB   Diclofenac Sodium Other (See Comments)    Hives, "buggy feeling all over"   Metformin And Related Diarrhea    High doses at once   Vancomycin Anxiety    Red man syndrome   Voltaren [Diclofenac Sodium] Other (See Comments)    Feels like things are crawling on him    Current Meds  Medication Sig   acetaminophen (TYLENOL) 500 MG tablet Take 500 mg by mouth 3 (three) times daily.   albuterol (PROVENTIL HFA;VENTOLIN HFA) 108 (90 Base) MCG/ACT inhaler Inhale 2 puffs into the lungs every 4 (four) hours as needed for shortness of breath (only if you can't catch your breath/ asthma).   albuterol (PROVENTIL) (2.5 MG/3ML) 0.083% nebulizer solution Take 3 mLs (2.5 mg total) by nebulization every 6 (six) hours as needed for wheezing or shortness of breath.   atorvastatin (LIPITOR) 20 MG tablet Take 20 mg by mouth at bedtime.   bisoprolol (ZEBETA) 5 MG tablet TAKE 1 AND 1/2 TABLET BY MOUTH TWICE DAILY. (Patient taking differently: Take 5 mg by mouth See admin instructions. Take 1 & 1/2 tablets by mouth twice  daily)   BREZTRI AEROSPHERE 160-9-4.8 MCG/ACT AERO INHALE 2 PUFFS INTO THE LUNGS (IN THE MORNING AND AT BEDTIME). (Patient taking differently: Inhale 2 puffs into the lungs 2 (two) times daily.)   Carboxymethylcellul-Glycerin (LUBRICATING EYE DROPS OP) Place 1 drop into the right eye daily as needed (dry eyes).   diltiazem (CARTIA XT) 120 MG 24 hr capsule TAKE (1) CAPSULE BY MOUTH DAILY, MAY TAKE A EXTRA CAPSULE DAILY FOR BREAKTHROUGH AFIB. (Patient taking differently: Take 120 mg by mouth See admin instructions. Take 1 capsule by mouth ,may take an extra capsule for breakthrough afib)   dofetilide (TIKOSYN) 500  MCG capsule Take 1 capsule (500 mcg total) by mouth 2 (two) times daily.   Dupilumab (DUPIXENT) 300 MG/2ML SOPN Inject 300 mg into the skin every 14 (fourteen) days.   EPINEPHrine 0.3 mg/0.3 mL IJ SOAJ injection Inject 0.3 mg into the muscle as needed for anaphylaxis.   furosemide (LASIX) 40 MG tablet TAKE 1 TABLET BY MOUTH DAILY   glipiZIDE (GLUCOTROL XL) 5 MG 24 hr tablet Take 5 mg by mouth daily before breakfast.   hydrocortisone cream 0.5 % Apply 1 Application topically daily as needed for itching.   JARDIANCE 25 MG TABS tablet Take 25 mg by mouth daily before breakfast.   levothyroxine (SYNTHROID) 137 MCG tablet Take 150 mcg by mouth daily before breakfast.   losartan (COZAAR) 50 MG tablet Take 50 mg by mouth at bedtime.   metFORMIN (GLUCOPHAGE-XR) 500 MG 24 hr tablet Take 500 mg by mouth in the morning and at bedtime.   Multiple Vitamin (MULTIVITAMIN) tablet Take 1 tablet by mouth daily.   mupirocin ointment (BACTROBAN) 2 % Apply 1 Application topically 2 (two) times daily.   omeprazole (PRILOSEC OTC) 20 MG tablet Take 20 mg by mouth every morning.   Respiratory Therapy Supplies (FLUTTER) DEVI Use as directed   rivaroxaban (XARELTO) 20 MG TABS tablet Take 20 mg by mouth at bedtime.   Immunization History  Administered Date(s) Administered   Fluad Quad(high Dose 65+) 09/12/2019,  10/15/2022   Influenza Split 11/14/2015, 10/11/2018   Influenza, High Dose Seasonal PF 09/28/2017   Influenza-Unspecified 11/02/2016, 09/03/2020, 10/03/2021   Moderna SARS-COV2 Booster Vaccination 08/20/2020, 03/04/2021   Moderna Sars-Covid-2 Vaccination 02/15/2020, 03/14/2020   Pneumococcal Conjugate-13 09/28/2015   Tdap 11/28/2019        Objective:   Physical Exam BP 124/68 (BP Location: Right Arm, Cuff Size: Large)   Pulse 60   Temp 98.1 F (36.7 C)   Ht  (1.753 m)   Wt 226 lb 3.2 oz (102.6 kg)   SpO2 90%   BMI 33.40 kg/m   SpO2: 90 % O2 Device: None (Room air) O2 Flow Rate (L/min): 3 L/min  GENERAL: Well-developed, obese patient in no acute distress.  He is comfortable on nasal cannula O2.  He is fully ambulatory. HEAD: Normocephalic, atraumatic.  EYES: Pupils equal, round, reactive to light.  No scleral icterus.  MOUTH:   Upper and lower dentures.  Oral mucosa moist.  No thrush. NECK: Supple. No thyromegaly. Trachea midline. No JVD.  No adenopathy. PULMONARY: Good air entry bilaterally.  Non labored, rhonchorous throughout, wheezes noted.  Mildly diminished breath sounds left base. CARDIOVASCULAR: S1 and S2.  Regular rate and rhythm, NSR, rate 62.  Grade 2/6 mitral regurgitation murmur. Pacemaker on left. GASTROINTESTINAL: Obese, otherwise benign. MUSCULOSKELETAL: No joint deformity, no clubbing, no edema.  NEUROLOGIC: No focal deficits, speech is fluent.  No gait disturbance noted.  Awake, alert and oriented. SKIN: Intact,warm,dry. PSYCH: Mood and behavior appropriate.   Representative image from CT scan performed 09 April 2023 showing left lower lobe collapse:     Assessment & Plan:     ICD-10-CM   1. Bronchiectasis with acute exacerbation  J47.1    Cefdinir 300 mg twice daily x 10 days Vest physiotherapy at least twice a day Continue Acapella    2. Asthma-COPD overlap syndrome  J44.89    Continue Breztri 2 puffs twice a day Continue as needed  albuterol Continue Dupixent    3. Atelectasis of left lung  J98.11    Vest physiotherapy Acapella  Reimage 4 to 6 weeks     Meds ordered this encounter  Medications   cefdinir (OMNICEF) 300 MG capsule    Sig: Take 1 capsule (300 mg total) by mouth 2 (two) times daily for 10 days.    Dispense:  20 capsule    Refill:  0   Will see the patient in follow-up in 3 to 4 weeks time he is to contact us prior to that time should any new difficulties arise.  Clarence Shelter, MD Advanced Bronchoscopy [facility]    *This note was dictated using voice recognition software/Dragon.  Despite best efforts to proofread, errors can occur which can change the meaning. Any transcriptional errors that result from this process are unintentional and may not be fully corrected at the time of dictation.

## 2023-04-14 NOTE — Patient Instructions (Signed)
We have sent in an antibiotic for you to take for 10 days.  Follow-up in 3 to 4 weeks.

## 2023-04-20 ENCOUNTER — Encounter: Payer: Self-pay | Admitting: Pulmonary Disease

## 2023-04-20 MED ORDER — BREZTRI AEROSPHERE 160-9-4.8 MCG/ACT IN AERO: 2.0000 | INHALATION_SPRAY | Freq: Two times a day (BID) | RESPIRATORY_TRACT | 5 refills | Status: DC

## 2023-04-27 ENCOUNTER — Encounter: Payer: Self-pay | Admitting: Internal Medicine

## 2023-04-27 ENCOUNTER — Other Ambulatory Visit: Payer: Self-pay

## 2023-04-28 ENCOUNTER — Other Ambulatory Visit (HOSPITAL_COMMUNITY): Payer: Self-pay

## 2023-04-30 ENCOUNTER — Encounter: Payer: Self-pay | Admitting: Pulmonary Disease

## 2023-04-30 DIAGNOSIS — J471 Bronchiectasis with (acute) exacerbation: Secondary | ICD-10-CM

## 2023-04-30 NOTE — Telephone Encounter (Signed)
We had avoided giving him steroids because of the atrial fibrillation.  But he may need a short course of 5 days of 20 mg prednisone daily.  He did have quite a bit of airway inflammation when I saw him last.  He has an upcoming appointment with me on the 14th.  The past he also has been on tobramycin via nebulizer twice a day and we may need to restart that.  He can also have him collect sputum again to make sure there is not a different bug that is now colonizing his airways.

## 2023-05-01 MED ORDER — PREDNISONE 20 MG PO TABS: 20.0000 mg | ORAL_TABLET | Freq: Every day | ORAL | 0 refills | Status: DC

## 2023-05-04 ENCOUNTER — Other Ambulatory Visit (HOSPITAL_COMMUNITY): Payer: Self-pay

## 2023-05-04 NOTE — Telephone Encounter (Signed)
If his sputum has dried up we can hold off on it until he sees me.

## 2023-05-12 ENCOUNTER — Encounter: Payer: Self-pay | Admitting: Pulmonary Disease

## 2023-05-12 ENCOUNTER — Ambulatory Visit
Admission: RE | Admit: 2023-05-12 | Discharge: 2023-05-12 | Disposition: A | Discharge status: Home / Self Care | Payer: Medicare Other | Attending: Pulmonary Disease | Admitting: Pulmonary Disease

## 2023-05-12 ENCOUNTER — Ambulatory Visit (INDEPENDENT_AMBULATORY_CARE_PROVIDER_SITE_OTHER): Payer: Medicare Other | Admitting: Pulmonary Disease

## 2023-05-12 ENCOUNTER — Ambulatory Visit
Admission: RE | Admit: 2023-05-12 | Discharge: 2023-05-12 | Disposition: A | Discharge status: Home / Self Care | Payer: Medicare Other | Source: Ambulatory Visit | Attending: Pulmonary Disease | Admitting: Pulmonary Disease

## 2023-05-12 VITALS — BP 118/62 | HR 61 | Temp 98.1°F | Ht 69.0 in | Wt 227.4 lb

## 2023-05-12 DIAGNOSIS — R0602 Shortness of breath: Secondary | ICD-10-CM

## 2023-05-12 DIAGNOSIS — J9611 Chronic respiratory failure with hypoxia: Secondary | ICD-10-CM

## 2023-05-12 DIAGNOSIS — J471 Bronchiectasis with (acute) exacerbation: Secondary | ICD-10-CM

## 2023-05-12 DIAGNOSIS — G4733 Obstructive sleep apnea (adult) (pediatric): Secondary | ICD-10-CM

## 2023-05-12 DIAGNOSIS — J9811 Atelectasis: Secondary | ICD-10-CM | POA: Diagnosis not present

## 2023-05-12 DIAGNOSIS — I080 Rheumatic disorders of both mitral and aortic valves: Secondary | ICD-10-CM

## 2023-05-12 DIAGNOSIS — J479 Bronchiectasis, uncomplicated: Secondary | ICD-10-CM | POA: Diagnosis not present

## 2023-05-12 DIAGNOSIS — J4489 Other specified chronic obstructive pulmonary disease: Secondary | ICD-10-CM | POA: Diagnosis not present

## 2023-05-12 DIAGNOSIS — I5189 Other ill-defined heart diseases: Secondary | ICD-10-CM

## 2023-05-12 DIAGNOSIS — I272 Pulmonary hypertension, unspecified: Secondary | ICD-10-CM

## 2023-05-12 MED ORDER — PREDNISONE 20 MG PO TABS: 20.0000 mg | ORAL_TABLET | Freq: Every day | ORAL | 1 refills | Status: DC

## 2023-05-12 NOTE — Patient Instructions (Signed)
Use your albuterol nebulizer first thing before the Breztri in the morning.  We have provided you a spacer for your Breztri.  As a general rule use the albuterol nebulizer before you use the vest.  Do not try to do them both at the same time.  We are getting another echocardiogram.  Seen in your lung was markedly better.  I have written a prescription for prednisone that you can keep in case you start getting worse again.  Let us know if you need to start taking it.  We will see you in follow-up in 4 to 6 weeks time call sooner should any new problems arise.

## 2023-05-12 NOTE — Progress Notes (Signed)
Subjective:    Patient ID: Clarence Dawson, male    DOB: 1946-01-10, 77 y.o.   MRN: [id number] Patient Care Team: Carylon Perches, MD as PCP - General (Internal Medicine) Marinus Maw, MD as PCP - Cardiology (Cardiology) Marinus Maw, MD as PCP - Electrophysiology (Cardiology) Salena Saner, MD as Consulting Physician (Pulmonary Disease)  Chief Complaint  Patient presents with   Follow-up    SOB with exertion. No wheezing. Cough with tan sputum.    HPI Clarence Dawson is a 77 year old former smoker (54 PY) who presents for follow-up from his most recent visit of 14 April 2023.  Recall that he had a visit to the emergency room visit at [facility] on 09 April 2023.  Clarence Dawson is followed here for COPD with chronic hypoxic respiratory failure, asthma, obstructive sleep apnea and bronchiectasis.  The April visit at [facility] was triggered by a fall at home after tripping over his oxygen tubing, he initially went to urgent care where chest x-ray showed possible left-sided rib fractures.  This would require follow-up at the ED.  The patient then underwent a CT scan of the chest that showed collapse of the left lower lobe similar to what he had had back in September 2021.  There was mucous plugging noted.  This is in keeping with his underlying asthma and bronchiectasis.  Prior to his fall the patient had "slacked off" on his vest physiotherapy at home.  He has now resumed that since his fall and doing it religiously twice a day.  He is also using an Acapella flutter valve.  He was not given any medications in the ED advised to take Tylenol for pain.  Prior to his fall he also was having a change of sputum color to brown and since his fall it has been brown with some occasional streaky hemoptysis.  At his prior visit here he was treated with cefdinir, instructed to use his vest physiotherapy consistently and uses Acapella flutter valve.  He is on Xarelto and Tikosyn for atrial fibrillation.  The Tikosyn limits the  antibiotics that can be used due to QT prolongation.  After his antibiotic therapy he continues to have issues with increased sputum production and chest tightness.  We gave him a short course of prednisone 20 mg daily for 5 days and he states that this made him feel like "new" again.  He has not had any fevers, chills or sweats.  Has had some malaise.  He notes that he is expectorating well after using the vest physiotherapy.    He has noted increased shortness of breath since his fall.  His had no wheezing since his prednisone short course.  He does have severe sleep apnea and just recently had his BiPAP equipment finally delivered to him.  He just started using it 2 nights ago.   For his respiratory issues he is maintained on Breztri 2 puffs twice a day, albuterol solution and inhaler as needed, Dupixent for his asthmatic component, vest physiotherapy, Acapella flutter valve.  Review of Systems A 10 point review of systems was performed and it is as noted above otherwise negative.  Patient Active Problem List   Diagnosis Date Noted   Hypercoagulable state due to persistent atrial fibrillation (HCC) 01/02/2020   Chronic diastolic heart failure (HCC) 11/22/2019   Paroxysmal atrial fibrillation (HCC) 09/22/2019   Atrial fibrillation with RVR (HCC)    Lobar pneumonia (HCC) 09/21/2019   Acute respiratory failure with hypoxia (HCC) 09/21/2019  Acute on chronic respiratory failure (HCC) 09/18/2019   Severe sepsis (HCC)    Elevated lactic acid level    GERD (gastroesophageal reflux disease)    Chronic bronchitis (HCC) 02/09/2019   CAP (community acquired pneumonia) 03/03/2018   Primary localized osteoarthritis of right knee 08/11/2017   Asthma with acute exacerbation 12/08/2016   Chest pain 10/20/2016   HTN (hypertension) 10/20/2016   Morbid obesity due to excess calories (HCC) 06/24/2016   Severe persistent asthma 02/12/2016   Upper airway cough syndrome 01/24/2016   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/2012   Persistent atrial fibrillation    Syncope    Hyperthyroidism    Hyperlipidemia    RBBB (right bundle branch block)    Tricuspid valve disorder    Aortic valve disorder    Carotid artery stenosis    Type 2 diabetes mellitus (HCC)    Pacemaker    OSA (obstructive sleep apnea)    Arthritis    Sinoatrial node dysfunction (HCC)    PVC's (premature ventricular contractions) 07/17/2011   Essential hypertension, benign 01/24/2011   Premature ventricular contractions 01/24/2011   PPM-[facility] 01/24/2011   Social History   Tobacco Use   Smoking status: Former    Packs/day: 3.00    Years: 25.00    Additional pack years: 0.00    Total pack years: 75.00    Types: Cigarettes    Start date: 02/20/1958    Quit date: 02/12/1984    Years since quitting: 39.2   Smokeless tobacco: Former    Types: Chew   Tobacco comments:    Former smoker 08/30/2021  Substance Use Topics   Alcohol use: Yes    Alcohol/week: 2.0 - 4.0 standard drinks of alcohol    Types: 1 - 2 Glasses of wine, 1 - 2 Cans of beer per week    Comment: 1 glass of wine or beer 3 times a week 12/26/22   Allergies  Allergen Reactions   Food Anaphylaxis and Shortness Of Breath    TREE NUTS   Iodinated Contrast Media Hives and Shortness Of Breath    Patient states hives to throat closing. (01/15/17: patient states this reaction was "about 20 years ago" with possibly an IVP.  He now says high doses of prednisone "throw me into AFib."  He has tolerated CT arthrograms with Benadrly 50mg  PO one hour before injection.  Donell Sievert, RN)   Shellfish Allergy Anaphylaxis and Shortness Of Breath    To shellfish, crabs.  Makes him feel like "things are crawling all over" me.  Denies airway issues with these foods.  Donell Sievert, RN 01/15/17)   Goat-Derived Products Hives    GOAT CHEESE    Prednisone Palpitations    PRECIPITATES A-FIB   Diclofenac Sodium Other (See Comments)    Hives, "buggy feeling all over"   Metformin And  Related Diarrhea    High doses at once   Vancomycin Anxiety    Red man syndrome   Voltaren [Diclofenac Sodium] Other (See Comments)    Feels like things are crawling on him   Current Meds  Medication Sig   acetaminophen (TYLENOL) 500 MG tablet Take 500 mg by mouth 3 (three) times daily.   albuterol (PROVENTIL HFA;VENTOLIN HFA) 108 (90 Base) MCG/ACT inhaler Inhale 2 puffs into the lungs every 4 (four) hours as needed for shortness of breath (only if you can't catch your breath/ asthma).   albuterol (PROVENTIL) (2.5 MG/3ML) 0.083% nebulizer solution Take 3 mLs (2.5 mg total) by nebulization every  6 (six) hours as needed for wheezing or shortness of breath.   atorvastatin (LIPITOR) 20 MG tablet Take 20 mg by mouth at bedtime.   bisoprolol (ZEBETA) 5 MG tablet TAKE 1 AND 1/2 TABLET BY MOUTH TWICE DAILY. (Patient taking differently: Take 5 mg by mouth See admin instructions. Take 1 & 1/2 tablets by mouth twice daily)   BREZTRI AEROSPHERE 160-9-4.8 MCG/ACT AERO INHALE 2 PUFFS INTO THE LUNGS (IN THE MORNING AND AT BEDTIME). (Patient taking differently: Inhale 2 puffs into the lungs 2 (two) times daily.)   Budeson-Glycopyrrol-Formoterol (BREZTRI AEROSPHERE) 160-9-4.8 MCG/ACT AERO Inhale 2 puffs into the lungs in the morning and at bedtime.   Carboxymethylcellul-Glycerin (LUBRICATING EYE DROPS OP) Place 1 drop into the right eye daily as needed (dry eyes).   diltiazem (CARTIA XT) 120 MG 24 hr capsule TAKE (1) CAPSULE BY MOUTH DAILY, MAY TAKE A EXTRA CAPSULE DAILY FOR BREAKTHROUGH AFIB. (Patient taking differently: Take 120 mg by mouth See admin instructions. Take 1 capsule by mouth ,may take an extra capsule for breakthrough afib)   dofetilide (TIKOSYN) 500 MCG capsule Take 1 capsule (500 mcg total) by mouth 2 (two) times daily.   Dupilumab (DUPIXENT) 300 MG/2ML SOPN Inject 300 mg into the skin every 14 (fourteen) days.   EPINEPHrine 0.3 mg/0.3 mL IJ SOAJ injection Inject 0.3 mg into the muscle as needed  for anaphylaxis.   furosemide (LASIX) 40 MG tablet TAKE 1 TABLET BY MOUTH DAILY   glipiZIDE (GLUCOTROL XL) 5 MG 24 hr tablet Take 5 mg by mouth daily before breakfast.   hydrocortisone cream 0.5 % Apply 1 Application topically daily as needed for itching.   JARDIANCE 25 MG TABS tablet Take 25 mg by mouth daily before breakfast.   levothyroxine (SYNTHROID) 137 MCG tablet Take 150 mcg by mouth daily before breakfast.   losartan (COZAAR) 50 MG tablet Take 50 mg by mouth at bedtime.   metFORMIN (GLUCOPHAGE-XR) 500 MG 24 hr tablet Take 500 mg by mouth in the morning and at bedtime.   Multiple Vitamin (MULTIVITAMIN) tablet Take 1 tablet by mouth daily.   mupirocin ointment (BACTROBAN) 2 % Apply 1 Application topically 2 (two) times daily.   omeprazole (PRILOSEC OTC) 20 MG tablet Take 20 mg by mouth every morning.   Respiratory Therapy Supplies (FLUTTER) DEVI Use as directed   rivaroxaban (XARELTO) 20 MG TABS tablet Take 20 mg by mouth at bedtime.   Immunization History  Administered Date(s) Administered   Fluad Quad(high Dose 65+) 09/12/2019, 10/15/2022   Influenza Split 11/14/2015, 10/11/2018   Influenza, High Dose Seasonal PF 09/28/2017   Influenza-Unspecified 11/02/2016, 09/03/2020, 10/03/2021   Moderna SARS-COV2 Booster Vaccination 08/20/2020, 03/04/2021   Moderna Sars-Covid-2 Vaccination 02/15/2020, 03/14/2020   Pneumococcal Conjugate-13 09/28/2015   Tdap 11/28/2019       Objective:   Physical Exam BP 118/62 (BP Location: Left Arm, Cuff Size: Normal)   Pulse 61   Temp 98.1 F (36.7 C)   Ht 5\' 9"  (1.753 m)   Wt 227 lb 6.4 oz (103.1 kg)   SpO2 90%   BMI 33.58 kg/m   SpO2: 90 % O2 Device: None (Room air)  GENERAL: Well-developed, obese patient in no acute distress.  He is comfortable on nasal cannula O2.  He is fully ambulatory. HEAD: Normocephalic, atraumatic.  EYES: Pupils equal, round, reactive to light.  No scleral icterus.  MOUTH:   Upper and lower dentures.  Oral mucosa  moist.  No thrush. NECK: Supple. No thyromegaly. Trachea midline. No  JVD.  No adenopathy. PULMONARY: Good air entry bilaterally air entry symmetrical throughout.  Non labored, no adventitious sounds. CARDIOVASCULAR: S1 and S2.  Regular rate and rhythm, NSR, rate 62.  Grade 2/6 mitral regurgitation murmur, grade 3/6 aortic stenosis murmur. Pacemaker on left. GASTROINTESTINAL: Obese, otherwise benign. MUSCULOSKELETAL: No joint deformity, no clubbing, no edema.  NEUROLOGIC: No focal deficits, speech is fluent.  No gait disturbance noted.  Awake, alert and oriented. SKIN: Intact,warm,dry. PSYCH: Mood and behavior appropriate.   Chest x-ray performed today was increased aeration on the left lower lobe with only a normal area of residual atelectasis remaining:     Assessment & Plan:     ICD-10-CM   1. Asthma-COPD overlap syndrome  J44.89    Continue Breztri 2 puffs twice a day Spacer provided for the inhaler As needed albuterol Continue Dupixent    2. Bronchiectasis without complication (HCC)  J47.9    Continue vest physiotherapy Continue Acapella flutter valve    3. Atelectasis of left lung  J98.11    Markedly improved by radiographs Continue vest physiotherapy Was likely due to mucous plugging Splinting from pain due to chest trauma    4. OSA (obstructive sleep apnea)  G47.33    CPAP switched to BiPAP Will reassess on follow-up visit    5. Chronic respiratory failure with hypoxia (HCC)  J96.11    Patient compliant with oxygen supplementation Continue same    6. Grade III diastolic dysfunction  I51.89 ECHOCARDIOGRAM COMPLETE   This issue adds complexity to his management Adds to his issues with dyspnea Reassess with echocardiogram    7. Mitral regurgitation and aortic stenosis  I08.0 ECHOCARDIOGRAM COMPLETE   Reassess with echocardiogram Aortic stenosis previously graded as moderate to severe Query if aortic stenosis worsening    8. Shortness of breath  R06.02     Multifactorial Asthma/COPD, diastolic dysfunction, aortic stenosis Pulmonary hypertension also playing a role    9. Pulmonary HTN (HCC)  I27.20    Noted on past echo Reassess with echocardiogram May be adding to his dyspnea issues     Orders Placed This Encounter  Procedures   ECHOCARDIOGRAM COMPLETE    Standing Status:   Future    Standing Expiration Date:   05/11/2024    Order Specific Question:   Where should this test be performed    Answer:   [facility] [place]    Order Specific Question:   Perflutren DEFINITY (image enhancing agent) should be administered unless hypersensitivity or allergy exist    Answer:   Administer Perflutren    Order Specific Question:   Reason for exam-Echo    Answer:   Pulmonary hypertension I27.2    Order Specific Question:   Other Comments    Answer:   Diastolic Dysfunction   Meds ordered this encounter  Medications   predniSONE (DELTASONE) 20 MG tablet    Sig: Take 1 tablet (20 mg total) by mouth daily.    Dispense:  5 tablet    Refill:  1   We have provided the patient with a prednisone short course in case his symptoms worsen again he has been instructed to call us if he needs to start the medication again due to worsening symptoms as this would trigger a sooner appointment.  Will see the patient in follow-up in 4 to 6 weeks time he is to contact us prior to that time should any new difficulties arise.  Gailen Shelter, MD Advanced Bronchoscopy PCCM [facility]    *  This note was dictated using voice recognition software/Dragon.  Despite best efforts to proofread, errors can occur which can change the meaning. Any transcriptional errors that result from this process are unintentional and may not be fully corrected at the time of dictation.

## 2023-05-15 ENCOUNTER — Ambulatory Visit (INDEPENDENT_AMBULATORY_CARE_PROVIDER_SITE_OTHER): Payer: Medicare Other

## 2023-05-15 DIAGNOSIS — I495 Sick sinus syndrome: Secondary | ICD-10-CM | POA: Diagnosis not present

## 2023-05-15 LAB — CUP PACEART REMOTE DEVICE CHECK
Battery Remaining Longevity: 100 mo
Battery Remaining Percentage: 95 %
Battery Voltage: 2.99 V
Brady Statistic AP VP Percent: 5 %
Brady Statistic AP VS Percent: 87 %
Brady Statistic AS VP Percent: 1 %
Brady Statistic AS VS Percent: 6.5 %
Brady Statistic RA Percent Paced: 70 %
Brady Statistic RV Percent Paced: 18 %
Date Time Interrogation Session: 20240517020012
Implantable Lead Connection Status: 753985
Implantable Lead Connection Status: 753985
Implantable Lead Implant Date: 20051106
Implantable Lead Implant Date: 20051106
Implantable Lead Location: 753859
Implantable Lead Location: 753860
Implantable Pulse Generator Implant Date: 20230710
Lead Channel Impedance Value: 350 Ohm
Lead Channel Impedance Value: 660 Ohm
Lead Channel Pacing Threshold Amplitude: 0.5 V
Lead Channel Pacing Threshold Amplitude: 0.75 V
Lead Channel Pacing Threshold Pulse Width: 0.5 ms
Lead Channel Pacing Threshold Pulse Width: 0.5 ms
Lead Channel Sensing Intrinsic Amplitude: 10.3 mV
Lead Channel Sensing Intrinsic Amplitude: 2.3 mV
Lead Channel Setting Pacing Amplitude: 2 V
Lead Channel Setting Pacing Amplitude: 2.5 V
Lead Channel Setting Pacing Pulse Width: 0.5 ms
Lead Channel Setting Sensing Sensitivity: 2 mV
Pulse Gen Model: 2272
Pulse Gen Serial Number: 8099041

## 2023-05-19 ENCOUNTER — Encounter: Payer: Self-pay | Admitting: Pulmonary Disease

## 2023-05-19 MED ORDER — ALBUTEROL SULFATE HFA 108 (90 BASE) MCG/ACT IN AERS: 2.0000 | INHALATION_SPRAY | RESPIRATORY_TRACT | 5 refills | Status: DC | PRN

## 2023-05-22 ENCOUNTER — Other Ambulatory Visit: Payer: Self-pay | Admitting: Pulmonary Disease

## 2023-05-22 ENCOUNTER — Other Ambulatory Visit: Payer: Self-pay

## 2023-05-22 ENCOUNTER — Other Ambulatory Visit (HOSPITAL_COMMUNITY): Payer: Self-pay

## 2023-05-22 DIAGNOSIS — J4550 Severe persistent asthma, uncomplicated: Secondary | ICD-10-CM

## 2023-05-22 MED ORDER — DUPIXENT 300 MG/2ML ~~LOC~~ SOAJ
300.0000 mg | SUBCUTANEOUS | 1 refills | Status: DC
  Filled 2023-05-22: qty 4, 28d supply, fill #0
  Filled 2023-06-24: qty 4, 28d supply, fill #1
  Filled 2023-07-21: qty 4, 28d supply, fill #2
  Filled 2023-08-19: qty 4, 28d supply, fill #3
  Filled 2023-09-10: qty 4, 28d supply, fill #4
  Filled 2023-10-12: qty 4, 28d supply, fill #5

## 2023-05-27 ENCOUNTER — Other Ambulatory Visit (HOSPITAL_COMMUNITY): Payer: Self-pay

## 2023-05-27 NOTE — Progress Notes (Signed)
Remote pacemaker transmission.   

## 2023-06-02 ENCOUNTER — Other Ambulatory Visit (HOSPITAL_COMMUNITY): Payer: Self-pay

## 2023-06-08 DIAGNOSIS — G4734 Idiopathic sleep related nonobstructive alveolar hypoventilation: Secondary | ICD-10-CM

## 2023-06-12 ENCOUNTER — Encounter: Payer: Self-pay | Admitting: Adult Health

## 2023-06-12 ENCOUNTER — Ambulatory Visit: Payer: Medicare Other | Admitting: Adult Health

## 2023-06-12 VITALS — BP 144/80 | HR 83 | Temp 97.8°F | Ht 69.0 in | Wt 221.0 lb

## 2023-06-12 DIAGNOSIS — G4733 Obstructive sleep apnea (adult) (pediatric): Secondary | ICD-10-CM

## 2023-06-12 DIAGNOSIS — J189 Pneumonia, unspecified organism: Secondary | ICD-10-CM

## 2023-06-12 DIAGNOSIS — J411 Mucopurulent chronic bronchitis: Secondary | ICD-10-CM | POA: Diagnosis not present

## 2023-06-12 DIAGNOSIS — J9611 Chronic respiratory failure with hypoxia: Secondary | ICD-10-CM | POA: Insufficient documentation

## 2023-06-12 NOTE — Progress Notes (Signed)
@Patient  ID: Clarence Dawson, male    DOB: 08-Jul-1946, 77 y.o.   MRN: [id number]  Chief Complaint  Patient presents with   Follow-up    Referring provider: Carylon Perches, MD  HPI: 77 year old male former smoker followed for COPD with asthma, bronchiectasis and chronic respiratory failure, obstructive sleep apnea  TEST/EVENTS :   06/12/2023 Follow up: COPD with Asthma, O2 RF, OSA  Patient returns for 1 month follow-up.  Patient had a traumatic fall at home in April with subsequent left-sided rib fractures.  CT chest showed collapse of the left lower lobe similar September 2021 when he had mucous plugging.  He was seen in the emergency room and treated with antibiotics.  And a steroid burst patient was recommended to increase his mucociliary clearance at home.  Encouraged to increase vest therapy and flutter valve.  He was also continued on Breztri twice daily.  Since last visit patient says he is feeling better.  Over the last few days he says he is actually felt the best he has felt in a while.  Feels that his breathing is not as difficult.  Rib pain is also subsided.  He is using his flutter valve at least 3 times a day.  Does his vest therapy twice a day.  Usually does his albuterol nebulizer twice a day.  He endorses compliance on his Breztri.  Patient remains on BiPAP with oxygen at bedtime.  Recent overnight oximetry on BiPAP showed some desaturations.  He was recommended increase his oxygen up to 4 L.  He has a repeat overnight oximetry test and 2 weeks on 4 L of oxygen with BiPAP.  Patient says he wears his BiPAP every single night.  Feels that he benefits from BiPAP.  BiPAP download was requested.  Patient says he remains independent lives at home.  Is able to do his shopping and house chores.  He does not exercise on a regular basis.  Gets short of breath with prolonged walking.  He remains on Dupixent every 2 weeks.  He denies any hemoptysis, unintentional weight loss or increased leg  swelling. Patient remains on 3 to 4 L of oxygen at home during the daytime.  On POC device today.  Patient's O2 saturations were borderline around 88 to 89%.  Recommend increase in his oxygen up to 4 L with pulse oxygen device.   Allergies  Allergen Reactions   Food Anaphylaxis and Shortness Of Breath    TREE NUTS   Iodinated Contrast Media Hives and Shortness Of Breath    Patient states hives to throat closing. (01/15/17: patient states this reaction was "about 20 years ago" with possibly an IVP.  He now says high doses of prednisone "throw me into AFib."  He has tolerated CT arthrograms with Benadrly 50mg  PO one hour before injection.  Donell Sievert, RN)   Shellfish Allergy Anaphylaxis and Shortness Of Breath    To shellfish, crabs.  Makes him feel like "things are crawling all over" me.  Denies airway issues with these foods.  Donell Sievert, RN 01/15/17)   Goat-Derived Products Hives    GOAT CHEESE    Prednisone Palpitations    PRECIPITATES A-FIB   Diclofenac Sodium Other (See Comments)    Hives, "buggy feeling all over"   Metformin And Related Diarrhea    High doses at once   Vancomycin Anxiety    Red man syndrome   Voltaren [Diclofenac Sodium] Other (See Comments)    Feels like things are crawling on  him    Immunization History  Administered Date(s) Administered   Fluad Quad(high Dose 65+) 09/12/2019, 10/15/2022   Influenza Split 11/14/2015, 10/11/2018   Influenza, High Dose Seasonal PF 09/28/2017   Influenza-Unspecified 11/02/2016, 09/03/2020, 10/03/2021   Moderna SARS-COV2 Booster Vaccination 08/20/2020, 03/04/2021   Moderna Sars-Covid-2 Vaccination 02/15/2020, 03/14/2020   Pneumococcal Conjugate-13 09/28/2015   Tdap 11/28/2019    Past Medical History:  Diagnosis Date   Allergic rhinitis    Aortic valve disorder    Asthma    since childhood- seasonal allergies induced   Cancer (HCC)    Skin cancer- squamous, basal   Carotid artery stenosis    Essential hypertension     Full dentures    GERD (gastroesophageal reflux disease)    H/O hiatal hernia    Hemorrhage of rectum    Hyperlipidemia    Hypothyroidism    Male circumcision    OSA (obstructive sleep apnea)    Osteoarthritis    Pacemaker    Oct 2005 in [place].   PAF (paroxysmal atrial fibrillation) (HCC)    Pneumonia    "several Times" 2015 last time   Primary localized osteoarthritis of right knee 08/11/2017   RBBB (right bundle branch block)    Sinoatrial node dysfunction (HCC)    Syncope    Tricuspid valve disorder    Type 2 diabetes mellitus (HCC)    Type II    Tobacco History: Social History   Tobacco Use  Smoking Status Former   Packs/day: 3.00   Years: 25.00   Additional pack years: 0.00   Total pack years: 75.00   Types: Cigarettes   Start date: 02/20/1958   Quit date: 02/12/1984   Years since quitting: 39.3  Smokeless Tobacco Former   Types: Chew  Tobacco Comments   Former smoker 08/30/2021   Counseling given: Not Answered Tobacco comments: Former smoker 08/30/2021   Outpatient Medications Prior to Visit  Medication Sig Dispense Refill   acetaminophen (TYLENOL) 500 MG tablet Take 500 mg by mouth 3 (three) times daily.     albuterol (PROVENTIL) (2.5 MG/3ML) 0.083% nebulizer solution Take 3 mLs (2.5 mg total) by nebulization every 6 (six) hours as needed for wheezing or shortness of breath. 75 mL 6   albuterol (VENTOLIN HFA) 108 (90 Base) MCG/ACT inhaler Inhale 2 puffs into the lungs every 4 (four) hours as needed for shortness of breath (only if you can't catch your breath/ asthma). 18 g 5   atorvastatin (LIPITOR) 20 MG tablet Take 20 mg by mouth at bedtime.     bisoprolol (ZEBETA) 5 MG tablet TAKE 1 AND 1/2 TABLET BY MOUTH TWICE DAILY. (Patient taking differently: Take 5 mg by mouth See admin instructions. Take 1 & 1/2 tablets by mouth twice daily) 90 tablet 3   Budeson-Glycopyrrol-Formoterol (BREZTRI AEROSPHERE) 160-9-4.8 MCG/ACT AERO Inhale 2 puffs into the lungs in the  morning and at bedtime. 10.7 g 5   Carboxymethylcellul-Glycerin (LUBRICATING EYE DROPS OP) Place 1 drop into the right eye daily as needed (dry eyes).     diltiazem (CARTIA XT) 120 MG 24 hr capsule TAKE (1) CAPSULE BY MOUTH DAILY, MAY TAKE A EXTRA CAPSULE DAILY FOR BREAKTHROUGH AFIB. (Patient taking differently: Take 120 mg by mouth See admin instructions. Take 1 capsule by mouth ,may take an extra capsule for breakthrough afib) 180 capsule 3   dofetilide (TIKOSYN) 500 MCG capsule Take 1 capsule (500 mcg total) by mouth 2 (two) times daily. 180 capsule 3   Dupilumab (DUPIXENT) 300 MG/2ML  SOPN Inject 300 mg into the skin every 14 (fourteen) days. 12 mL 1   EPINEPHrine 0.3 mg/0.3 mL IJ SOAJ injection Inject 0.3 mg into the muscle as needed for anaphylaxis. 1 each 5   furosemide (LASIX) 40 MG tablet TAKE 1 TABLET BY MOUTH DAILY 90 tablet 3   glipiZIDE (GLUCOTROL XL) 5 MG 24 hr tablet Take 5 mg by mouth daily before breakfast.     hydrocortisone cream 0.5 % Apply 1 Application topically daily as needed for itching.     JARDIANCE 25 MG TABS tablet Take 25 mg by mouth daily before breakfast.     levothyroxine (SYNTHROID) 150 MCG tablet Take 150 mcg by mouth daily.     losartan (COZAAR) 50 MG tablet Take 50 mg by mouth at bedtime.     metFORMIN (GLUCOPHAGE-XR) 500 MG 24 hr tablet Take 500 mg by mouth in the morning and at bedtime.     Multiple Vitamin (MULTIVITAMIN) tablet Take 1 tablet by mouth daily.     mupirocin ointment (BACTROBAN) 2 % Apply 1 Application topically 2 (two) times daily. 22 g 0   omeprazole (PRILOSEC OTC) 20 MG tablet Take 20 mg by mouth every morning.     predniSONE (DELTASONE) 20 MG tablet Take 1 tablet (20 mg total) by mouth daily. 5 tablet 1   Respiratory Therapy Supplies (FLUTTER) DEVI Use as directed 1 each 0   rivaroxaban (XARELTO) 20 MG TABS tablet Take 20 mg by mouth at bedtime.     BREZTRI AEROSPHERE 160-9-4.8 MCG/ACT AERO INHALE 2 PUFFS INTO THE LUNGS (IN THE MORNING AND AT  BEDTIME). (Patient not taking: Reported on 06/12/2023) 10.7 g 5   levothyroxine (SYNTHROID) 137 MCG tablet Take 150 mcg by mouth daily before breakfast.     No facility-administered medications prior to visit.     Review of Systems:   Constitutional:   No  weight loss, night sweats,  Fevers, chills,  +fatigue, or  lassitude.  HEENT:   No headaches,  Difficulty swallowing,  Tooth/dental problems, or  Sore throat,                No sneezing, itching, ear ache, nasal congestion, post nasal drip,   CV:  No chest pain,  Orthopnea, PND, swelling in lower extremities, anasarca, dizziness, palpitations, syncope.   GI  No heartburn, indigestion, abdominal pain, nausea, vomiting, diarrhea, change in bowel habits, loss of appetite, bloody stools.   Resp: .  No chest wall deformity  Skin: no rash or lesions.  GU: no dysuria, change in color of urine, no urgency or frequency.  No flank pain, no hematuria   MS:  No joint pain or swelling.  No decreased range of motion.  No back pain.    Physical Exam  BP (!) 144/80 (BP Location: Right Arm, Cuff Size: Large)   Pulse 83   Temp 97.8 F (36.6 C)   Ht 5\' 9"  (1.753 m)   Wt 221 lb (100.2 kg)   SpO2 90%   BMI 32.64 kg/m   GEN: A/Ox3; pleasant , NAD, well nourished, on O2  HEENT:  [place]/AT,   NOSE-clear, THROAT-clear, no lesions, no postnasal drip or exudate noted.   NECK:  Supple w/ fair ROM; no JVD; normal carotid impulses w/o bruits; no thyromegaly or nodules palpated; no lymphadenopathy.    RESP  Clear  P & A; w/o, wheezes/ rales/ or rhonchi. no accessory muscle use, no dullness to percussion  CARD:  RRR, no m/r/g, trace peripheral  edema, pulses intact, no cyanosis or clubbing.  Scattered lower extremity varicose veins and venous insufficiency  GI:   Soft & nt; nml bowel sounds; no organomegaly or masses detected.   Musco: Warm bil, no deformities or joint swelling noted.   Neuro: alert, no focal deficits noted.    Skin: Warm, no  lesions or rashes    Lab Results:  CBC    Component Value Date/Time   WBC 6.8 07/29/2022 0831   WBC 5.9 08/30/2021 1019   RBC 5.38 07/29/2022 0831   RBC 5.42 08/30/2021 1019   HGB 16.6 07/29/2022 0831   HCT 49.6 07/29/2022 0831   PLT 170 07/29/2022 0831   MCV 92 07/29/2022 0831   MCH 30.9 07/29/2022 0831   MCH 30.4 08/30/2021 1019   MCHC 33.5 07/29/2022 0831   MCHC 33.1 08/30/2021 1019   RDW 14.2 07/29/2022 0831   LYMPHSABS 1.4 09/25/2019 0544   MONOABS 0.6 09/25/2019 0544   EOSABS 0.1 09/25/2019 0544   BASOSABS 0.0 09/25/2019 0544    BMET    Component Value Date/Time   NA 140 02/26/2023 0850   NA 142 01/27/2023 1556   K 4.0 02/26/2023 0850   CL 106 02/26/2023 0850   CO2 25 02/26/2023 0850   GLUCOSE 110 (H) 02/26/2023 0850   BUN 22 02/26/2023 0850   BUN 18 01/27/2023 1556   CREATININE 1.04 02/26/2023 0850   CREATININE 1.27 (H) 08/15/2016 0941   CALCIUM 9.0 02/26/2023 0850   GFRNONAA >60 02/26/2023 0850   GFRAA 99 11/22/2019 1358    BNP    Component Value Date/Time   BNP 231.3 (H) 02/26/2023 0851    ProBNP    Component Value Date/Time   PROBNP 95.0 01/19/2018 1016    Imaging: CUP PACEART REMOTE DEVICE CHECK  Result Date: 05/15/2023 Scheduled remote reviewed. Normal device function.  23 AMS, 6-32sec in duration primarily showing FF oversensing Next remote 91 days. LA, CVRS         No data to display          Lab Results  Component Value Date   NITRICOXIDE 31 11/25/2022        Assessment & Plan:   Chronic bronchitis (HCC) Recent acute asthmatic bronchitic exacerbation after recent fall with rib fractures.  Clinically patient has improved after antibiotics and steroids.  Also continue with aggressive mucociliary clearance.  Follow-up chest x-ray last visit showed significant improvement.  Plan  Patient Instructions  Continue on Breztri 2 puffs Twice daily, rinse after use.  Continue on Dupixent every 2 weeks  Albuterol inhaler and  neb As needed   Flutter valve Three times a day   Vest therapy Twice daily Continue on Oxygen 3l/m daytime  Continue on BIPAP At bedtime  with Oxygen 4l/m  Activity as tolerated.  Follow up with 2-3 months with Dr. Jayme Cloud and As needed      CAP (community acquired pneumonia) Recent CT chest after fall April 2024 showed some left lower lobe collapse with suspected mucous plugging.  Follow-up chest x-ray showed significant improvement.  Continue with mucociliary clearance.  And bronchodilators  OSA (obstructive sleep apnea) Continue on nocturnal BiPAP with oxygen.  BiPAP download as requested.  Plan  . Patient Instructions  Continue on Breztri 2 puffs Twice daily, rinse after use.  Continue on Dupixent every 2 weeks  Albuterol inhaler and neb As needed   Flutter valve Three times a day   Vest therapy Twice daily Continue on Oxygen 3l/m daytime  Continue on BIPAP At bedtime  with Oxygen 4l/m  Activity as tolerated.  Follow up with 2-3 months with Dr. Jayme Cloud and As needed      Chronic respiratory failure with hypoxia (HCC) Continue on oxygen to maintain O2 saturations greater than 88 to 9%.  Needs 4 L on pulse device.  Continue on 4 L at bedtime but over no on BiPAP with oxygen in 2 weeks as planned     Rubye Oaks, NP 06/12/2023

## 2023-06-12 NOTE — Assessment & Plan Note (Signed)
Recent acute asthmatic bronchitic exacerbation after recent fall with rib fractures.  Clinically patient has improved after antibiotics and steroids.  Also continue with aggressive mucociliary clearance.  Follow-up chest x-ray last visit showed significant improvement.  Plan  Patient Instructions  Continue on Breztri 2 puffs Twice daily, rinse after use.  Continue on Dupixent every 2 weeks  Albuterol inhaler and neb As needed   Flutter valve Three times a day   Vest therapy Twice daily Continue on Oxygen 3l/m daytime  Continue on BIPAP At bedtime  with Oxygen 4l/m  Activity as tolerated.  Follow up with 2-3 months with Dr. Jayme Cloud and As needed

## 2023-06-12 NOTE — Assessment & Plan Note (Signed)
Recent CT chest after fall April 2024 showed some left lower lobe collapse with suspected mucous plugging.  Follow-up chest x-ray showed significant improvement.  Continue with mucociliary clearance.  And bronchodilators

## 2023-06-12 NOTE — Assessment & Plan Note (Signed)
Continue on oxygen to maintain O2 saturations greater than 88 to 9%.  Needs 4 L on pulse device.  Continue on 4 L at bedtime but over no on BiPAP with oxygen in 2 weeks as planned

## 2023-06-12 NOTE — Assessment & Plan Note (Signed)
Continue on nocturnal BiPAP with oxygen.  BiPAP download as requested.  Plan  . Patient Instructions  Continue on Breztri 2 puffs Twice daily, rinse after use.  Continue on Dupixent every 2 weeks  Albuterol inhaler and neb As needed   Flutter valve Three times a day   Vest therapy Twice daily Continue on Oxygen 3l/m daytime  Continue on BIPAP At bedtime  with Oxygen 4l/m  Activity as tolerated.  Follow up with 2-3 months with Dr. Jayme Cloud and As needed

## 2023-06-12 NOTE — Patient Instructions (Addendum)
Continue on Breztri 2 puffs Twice daily, rinse after use.  Continue on Dupixent every 2 weeks  Albuterol inhaler and neb As needed   Flutter valve Three times a day   Vest therapy Twice daily Continue on Oxygen 3l/m daytime  Continue on BIPAP At bedtime  with Oxygen 4l/m  Activity as tolerated.  Follow up with 2-3 months with Dr. Jayme Cloud and As needed

## 2023-06-12 NOTE — Progress Notes (Signed)
Agree with the details of the visit as noted by Tammy Parrett, NP.  C. Laura Marshe Shrestha, MD [facility] 

## 2023-06-13 ENCOUNTER — Encounter: Payer: Self-pay | Admitting: Pulmonary Disease

## 2023-06-24 ENCOUNTER — Other Ambulatory Visit (HOSPITAL_COMMUNITY): Payer: Self-pay

## 2023-06-25 ENCOUNTER — Ambulatory Visit: Payer: Medicare Other | Admitting: Pulmonary Disease

## 2023-06-29 ENCOUNTER — Encounter: Payer: Self-pay | Admitting: Pulmonary Disease

## 2023-06-29 ENCOUNTER — Telehealth: Payer: Self-pay | Admitting: Pulmonary Disease

## 2023-06-29 NOTE — Telephone Encounter (Signed)
We received a denial letter for the code [id number] which is the 3D part of the echo. I have spoke with Clarence Dawson and informed him he is ok to do echo on 07/03/23

## 2023-06-29 NOTE — Telephone Encounter (Signed)
Synetta Fail, can you assist with this? Does cardiology handle the PA's for echos?

## 2023-06-29 NOTE — Telephone Encounter (Signed)
See patient message from today. The patient has been contacted about his Echo.  Nothing further needed.

## 2023-06-29 NOTE — Telephone Encounter (Signed)
Patient states Echo is not covered by insurance. Patient phone number is [phone].

## 2023-06-30 ENCOUNTER — Encounter: Payer: Self-pay | Admitting: Pulmonary Disease

## 2023-07-03 ENCOUNTER — Encounter: Payer: Self-pay | Admitting: Internal Medicine

## 2023-07-03 ENCOUNTER — Other Ambulatory Visit: Payer: Self-pay | Admitting: Pulmonary Disease

## 2023-07-03 ENCOUNTER — Ambulatory Visit: Payer: Medicare Other | Attending: Pulmonary Disease

## 2023-07-03 DIAGNOSIS — I080 Rheumatic disorders of both mitral and aortic valves: Secondary | ICD-10-CM

## 2023-07-03 DIAGNOSIS — I5189 Other ill-defined heart diseases: Secondary | ICD-10-CM

## 2023-07-03 LAB — ECHOCARDIOGRAM COMPLETE
AR max vel: 1.24 cm2
AV Area VTI: 1.22 cm2
AV Area mean vel: 1.21 cm2
AV Mean grad: 13 mmHg
AV Peak grad: 23.7 mmHg
Ao pk vel: 2.44 m/s
Area-P 1/2: 3.6 cm2
Calc EF: 51.2 %
S' Lateral: 4.4 cm
Single Plane A2C EF: 48.3 %
Single Plane A4C EF: 54 %

## 2023-07-03 NOTE — Telephone Encounter (Signed)
Dr. Jayme Cloud, just an FYI.

## 2023-07-14 ENCOUNTER — Other Ambulatory Visit: Payer: Self-pay | Admitting: Internal Medicine

## 2023-07-17 ENCOUNTER — Other Ambulatory Visit: Payer: Self-pay

## 2023-07-17 MED ORDER — FUROSEMIDE 40 MG PO TABS: 40.0000 mg | ORAL_TABLET | Freq: Every day | ORAL | 0 refills | Status: DC

## 2023-07-21 ENCOUNTER — Other Ambulatory Visit (HOSPITAL_COMMUNITY): Payer: Self-pay

## 2023-07-22 ENCOUNTER — Other Ambulatory Visit (HOSPITAL_COMMUNITY): Payer: Self-pay

## 2023-08-10 ENCOUNTER — Encounter (HOSPITAL_COMMUNITY): Payer: Self-pay | Admitting: Cardiology

## 2023-08-10 ENCOUNTER — Other Ambulatory Visit (HOSPITAL_COMMUNITY): Payer: Medicare Other

## 2023-08-10 ENCOUNTER — Ambulatory Visit (HOSPITAL_COMMUNITY)
Admission: RE | Admit: 2023-08-10 | Discharge: 2023-08-10 | Disposition: A | Discharge status: Home / Self Care | Payer: Medicare Other | Source: Ambulatory Visit | Attending: Cardiology | Admitting: Cardiology

## 2023-08-10 ENCOUNTER — Other Ambulatory Visit (HOSPITAL_COMMUNITY): Payer: Self-pay | Admitting: *Deleted

## 2023-08-10 VITALS — BP 140/62 | HR 60

## 2023-08-10 DIAGNOSIS — I5032 Chronic diastolic (congestive) heart failure: Secondary | ICD-10-CM | POA: Diagnosis not present

## 2023-08-10 DIAGNOSIS — I495 Sick sinus syndrome: Secondary | ICD-10-CM | POA: Insufficient documentation

## 2023-08-10 DIAGNOSIS — Z7901 Long term (current) use of anticoagulants: Secondary | ICD-10-CM | POA: Insufficient documentation

## 2023-08-10 DIAGNOSIS — J4489 Other specified chronic obstructive pulmonary disease: Secondary | ICD-10-CM | POA: Diagnosis not present

## 2023-08-10 DIAGNOSIS — Z87891 Personal history of nicotine dependence: Secondary | ICD-10-CM | POA: Diagnosis not present

## 2023-08-10 DIAGNOSIS — I110 Hypertensive heart disease with heart failure: Secondary | ICD-10-CM | POA: Insufficient documentation

## 2023-08-10 DIAGNOSIS — I272 Pulmonary hypertension, unspecified: Secondary | ICD-10-CM | POA: Diagnosis not present

## 2023-08-10 DIAGNOSIS — I480 Paroxysmal atrial fibrillation: Secondary | ICD-10-CM

## 2023-08-10 DIAGNOSIS — J479 Bronchiectasis, uncomplicated: Secondary | ICD-10-CM | POA: Diagnosis not present

## 2023-08-10 DIAGNOSIS — I5082 Biventricular heart failure: Secondary | ICD-10-CM | POA: Insufficient documentation

## 2023-08-10 DIAGNOSIS — Z79899 Other long term (current) drug therapy: Secondary | ICD-10-CM | POA: Diagnosis not present

## 2023-08-10 DIAGNOSIS — G4733 Obstructive sleep apnea (adult) (pediatric): Secondary | ICD-10-CM | POA: Diagnosis not present

## 2023-08-10 DIAGNOSIS — Z4502 Encounter for adjustment and management of automatic implantable cardiac defibrillator: Secondary | ICD-10-CM | POA: Diagnosis not present

## 2023-08-10 DIAGNOSIS — I4510 Unspecified right bundle-branch block: Secondary | ICD-10-CM | POA: Insufficient documentation

## 2023-08-10 LAB — CBC
HCT: 46.7 % (ref 39.0–52.0)
Hemoglobin: 15.3 g/dL (ref 13.0–17.0)
MCH: 29.4 pg (ref 26.0–34.0)
MCHC: 32.8 g/dL (ref 30.0–36.0)
MCV: 89.8 fL (ref 80.0–100.0)
Platelets: 185 10*3/uL (ref 150–400)
RBC: 5.2 MIL/uL (ref 4.22–5.81)
RDW: 13 % (ref 11.5–15.5)
WBC: 6.7 10*3/uL (ref 4.0–10.5)
nRBC: 0 % (ref 0.0–0.2)

## 2023-08-10 LAB — BASIC METABOLIC PANEL
Anion gap: 7 (ref 5–15)
BUN: 20 mg/dL (ref 8–23)
CO2: 27 mmol/L (ref 22–32)
Calcium: 9.1 mg/dL (ref 8.9–10.3)
Chloride: 105 mmol/L (ref 98–111)
Creatinine, Ser: 0.93 mg/dL (ref 0.61–1.24)
GFR, Estimated: >60 mL/min (ref >60)
Glucose, Bld: 160 mg/dL — ABNORMAL HIGH (ref 70–99)
Potassium: 4.5 mmol/L (ref 3.5–5.1)
Sodium: 139 mmol/L (ref 135–145)

## 2023-08-10 LAB — BRAIN NATRIURETIC PEPTIDE: B Natriuretic Peptide: 354.8 pg/mL — ABNORMAL HIGH (ref 0.0–100.0)

## 2023-08-10 MED ORDER — FUROSEMIDE 40 MG PO TABS: ORAL_TABLET | ORAL | 3 refills | Status: DC

## 2023-08-10 NOTE — H&P (View-Only) (Signed)
 PCP: Carylon Perches, MD Cardiology: Dr. Ladona Ridgel HF Cardiology: Dr. Shirlee Latch  77 y.o. with history of paroxysmal atrial fibrillation, COPD and chronic bronchiectasis, and diastolic CHF with prominent RV failure was referred by Dr. Ladona Ridgel for evaluation of CHF and pulmonary hypertension. Patient is on 3-4 L home oxygen and uses Bipap at night.  He quit smoking in 1985 but has COPD/chronic bronchitis.  He also has bronchiectasis with history of mucus plugging/lobar collapse.  He had a remote atrial fibrillation ablation and has been on dofetilide.  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    In 4/24, he tripped, fell, and broke several left-sided ribs.  He ended up with left lower lobe collapse due to mucus plugging.  This resolved over time. However, he has been considerably more short of breath since then.  Echo was done in 7/24, showing EF 55-60%, grade 2 diastolic dysfunction, moderate RV enlargement with mild RV dysfunction, PASP 96 mmHg, severe biatrial enlargement, mild-moderate MR, IVC dilated.  Patient is short of breath walking < 100 feet, he gets winded walking across his house.  This has worsened over the last few months.  He is very short of breath carrying laundary, used to be able to do this with no problem several months ago. Short of breath walking from car to restaurant or store. He has orthopnea and elevates the head of his bed.  No typical chest pain but does feel heaviness in his chest at times. No lightheadedness or syncope.  He is in NSR today with no palpitations.   St Jude device interrogation: 13% v-paced, 75% a-paced, 20% AF  Labs (2/24): BNP 231, K 4, creatinine 1.04  ECG (personally reviewed): a-paced, RBBB with QTc 534 msec  PMH: 1. Atrial fibrillation: Paroxysmal.  Remote AF ablation.  Now on dofetilide.  2. GERD 3. Type 2 diabetes 4. HTN 5. Hypothyroidism 6. Hyperlipidemia 7. COPD: Chronic bronchitis.  Quit smoking 1985.  On home oxygen 3-4 L.   8. Chronic  bronchiectasis 9. OSA: Uses Bipap 10. 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  11. Sick sinus syndrome: St Jude PPM  Social History   Socioeconomic History   Marital status: Widowed    Spouse name: Not on file   Number of children: Not on file   Years of education: Not on file   Highest education level: Not on file  Occupational History   Not on file  Tobacco Use   Smoking status: Former    Current packs/day: 0.00    Average packs/day: 3.0 packs/day for 26.0 years (77.9 ttl pk-yrs)    Types: Cigarettes    Start date: 02/20/1958    Quit date: 02/12/1984    Years since quitting: 39.5   Smokeless tobacco: Former    Types: Chew   Tobacco comments:    Former smoker 08/30/2021  Vaping Use   Vaping status: Never Used  Substance and Sexual Activity   Alcohol use: Yes    Alcohol/week: 2.0 - 4.0 standard drinks of alcohol    Types: 1 - 2 Glasses of wine, 1 - 2 Cans of beer per week    Comment: 1 glass of wine or beer 3 times a week 12/26/22   Drug use: No   Sexual activity: Not Currently  Other Topics Concern   Not on file  Social History  Narrative   Regular exercise: No   Social Determinants of Health   Financial Resource Strain: Not on file  Food Insecurity: No Food Insecurity (01/13/2023)   Hunger Vital Sign    Worried About Running Out of Food in the Last Year: Never true    Ran Out of Food in the Last Year: Never true  Transportation Needs: No Transportation Needs (01/13/2023)   PRAPARE - Administrator, Civil Service (Medical): No    Lack of Transportation (Non-Medical): No  Physical Activity: Not on file  Stress: Not on file  Social Connections: Not on file  Intimate Partner Violence: Not At Risk (01/13/2023)   Humiliation, Afraid, Rape, and Kick questionnaire    Fear  of Current or Ex-Partner: No    Emotionally Abused: No    Physically Abused: No    Sexually Abused: No   Family History  Problem Relation Age of Onset   Liver cancer Mother    Pancreatic cancer Mother    Colon cancer Mother    Hypertension Father    Stroke Father    Other Father 39       Sudden Cardiac death   Heart attack Father    Cancer Sister        brain   Diabetes Sister    Colon cancer Maternal Aunt    Colon polyps Neg Hx    ROS: All systems reviewed and negative except as per HPI.   Current Outpatient Medications  Medication Sig Dispense Refill   acetaminophen (TYLENOL) 500 MG tablet Take 500 mg by mouth 3 (three) times daily.     albuterol (PROVENTIL) (2.5 MG/3ML) 0.083% nebulizer solution TAKE 1 VIAL VIA NEBULIZATION EVERY 6 HOURS AS NEEDED FOR WHEEZING OR SHORTNESS OF BREATH. 90 mL 2   albuterol (VENTOLIN HFA) 108 (90 Base) MCG/ACT inhaler Inhale 2 puffs into the lungs every 4 (four) hours as needed for shortness of breath (only if you can't catch your breath/ asthma). 18 g 5   atorvastatin (LIPITOR) 20 MG tablet Take 20 mg by mouth at bedtime.     bisoprolol (ZEBETA) 5 MG tablet TAKE 1 AND 1/2 TABLET BY MOUTH TWICE DAILY. (Patient taking differently: Take 5 mg by mouth See admin instructions. Take 1 & 1/2 tablets by mouth twice daily) 90 tablet 3   BREZTRI AEROSPHERE 160-9-4.8 MCG/ACT AERO INHALE 2 PUFFS INTO THE LUNGS (IN THE MORNING AND AT BEDTIME). 10.7 g 5   Budeson-Glycopyrrol-Formoterol (BREZTRI AEROSPHERE) 160-9-4.8 MCG/ACT AERO Inhale 2 puffs into the lungs in the morning and at bedtime. 10.7 g 5   Carboxymethylcellul-Glycerin (LUBRICATING EYE DROPS OP) Place 1 drop into the right eye daily as needed (dry eyes).     diltiazem (CARTIA XT) 120 MG 24 hr capsule TAKE (1) CAPSULE BY MOUTH DAILY, MAY TAKE A EXTRA CAPSULE DAILY FOR BREAKTHROUGH AFIB. (Patient taking differently: Take 120 mg by mouth See admin instructions. Take 1 capsule by mouth ,may take an extra  capsule for breakthrough afib) 180 capsule 3   dofetilide (TIKOSYN) 500 MCG capsule Take 1 capsule (500 mcg total) by mouth 2 (two) times daily. 180 capsule 3   Dupilumab (DUPIXENT) 300 MG/2ML SOPN Inject 300 mg into the skin every 14 (fourteen) days. 12 mL 1   EPINEPHrine 0.3 mg/0.3 mL IJ SOAJ injection Inject 0.3 mg into the muscle as needed for anaphylaxis. 1 each 5   glipiZIDE (GLUCOTROL XL) 5 MG 24 hr tablet Take 5 mg by mouth daily before breakfast.  hydrocortisone cream 0.5 % Apply 1 Application topically daily as needed for itching.     JARDIANCE 25 MG TABS tablet Take 25 mg by mouth daily before breakfast.     levothyroxine (SYNTHROID) 150 MCG tablet Take 150 mcg by mouth daily.     losartan (COZAAR) 50 MG tablet Take 50 mg by mouth at bedtime.     metFORMIN (GLUCOPHAGE-XR) 500 MG 24 hr tablet Take 500 mg by mouth in the morning and at bedtime.     Multiple Vitamin (MULTIVITAMIN) tablet Take 1 tablet by mouth daily.     mupirocin ointment (BACTROBAN) 2 % Apply 1 Application topically 2 (two) times daily. 22 g 0   omeprazole (PRILOSEC OTC) 20 MG tablet Take 20 mg by mouth every morning.     Respiratory Therapy Supplies (FLUTTER) DEVI Use as directed 1 each 0   rivaroxaban (XARELTO) 20 MG TABS tablet Take 20 mg by mouth at bedtime.     furosemide (LASIX) 40 MG tablet Take 1 tablet (40 mg total) by mouth in the morning AND 0.5 tablets (20 mg total) every evening. 90 tablet 3   No current facility-administered medications for this encounter.   BP (!) 140/62   Pulse 60   SpO2 92%  General: NAD Neck: JVP 8-9 cm, no thyromegaly or thyroid nodule.  Lungs: Distant BS CV: Nondisplaced PMI.  Heart regular S1/S2, no S3/S4, no murmur.  1+ edema 1/2 to knees bilaterally.  No carotid bruit.  Normal pedal pulses.  Abdomen: Soft, nontender, no hepatosplenomegaly, no distention.  Skin: Intact without lesions or rashes.  Neurologic: Alert and oriented x 3.  Psych: Normal affect. Extremities:  No clubbing or cyanosis.  HEENT: Normal.   Assessment/Plan: 1. Atrial fibrillation: Paroxysmal.  NSR today.  20% AF burden on device interrogation. Had remote AF ablation, now on dofetilide.  QTC 534 msec today, but accept < 550 msec with RBBB.  - Continue current dofetilide.  - Continue Xarelto - He is on bisoprolol and diltiazem CD for rate control if AF recurs. 2. Sick sinus syndrome: St Jude PPM.  3. 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  Echo (7/24) with EF 55-60%, grade 2 diastolic dysfunction, moderate RV enlargement with mild RV dysfunction, PASP 96 mmHg, severe biatrial enlargement, mild-moderate MR, IVC dilated.  Severe pulmonary hypertension and RV failure may be group 3 PH due to lung parenchymal disease (COPD, bronchiectasis).  Alternatively, there could be primarily group 2 PH in setting of PAF and diastolic CHF (severely dilated atria).  Group 1 PH seems unlikely. He looks volume overloaded on exam.  NYHA class IIIb symptoms, suspect due to both lung disease and CHF.  - Increase Lasix to 40 qam/20 qpm.  BMET/BNP today, BMET in 10 days.  - Continue Jardiance.  - I will arrange for RHC/LHC to assess filling pressures and contribution to his dyspnea from left heart failure, right heart failure, and pulmonary hypertention.  Given severe exertional symptoms, will assess for severe coronary disease as well.  Discussed risks/benefits with patient and he agrees to procedure.  Will hold Xarelto day of and day before cath.  4. COPD: Chronic bronchitis, quit smoking in 1985.  On 3-4 L home oxygen.  5. Chronic bronchiectasis: Has history of mucus plugging with lobar collapse.  6. OSA: Continue CPAP.   Followup 1 month.   Marca Ancona 08/10/2023

## 2023-08-10 NOTE — Patient Instructions (Signed)
Medication Changes:  Increase Furosemide to 40 mg (1 tab) in AM and 20 mg (1/2 tab) in PM  Lab Work:  Labs done today, your results will be available in MyChart, we will contact you for abnormal readings.   Testing/Procedures:  Heart Catheterization Tue 08/25/23, see instructions below  Referrals:  none  Special Instructions // Education:  CATHETERIZATION INSTRUCTIONS: You are scheduled for a Cardiac Catheterization on Tuesday, August 27 with Dr. Marca Ancona.  1. Please arrive at the [facility] (Main Entrance A) at [facility]: [address] [place], [place] [address] at 7:00 AM   Free valet parking service is available. You will check in at ADMITTING. The support person will be asked to wait in the waiting room.  It is OK to have someone drop you off and come back when you are ready to be discharged.    Special note: Every effort is made to have your procedure done on time. Please understand that emergencies sometimes delay scheduled procedures.  2. Diet: Do not eat solid foods after midnight.  The patient may have clear liquids until 5am upon the day of the procedure.  3. Labs: DONE TODAY  4. Medication instructions in preparation for your procedure:   DO NOT TAKE XARELTO MONDAY 8/26 OR TUESDAY 8/27  DO NOT TAKE METFORMIN TUE 8/27, WED 8/28, OR THUR 8/29  TUESDAY 8/27 AM DO NOT TAKE: FUROSEMIDE, GLIPIDIZIDE, JARDIANCE, METFORMIN, OR XARELTO   On the morning of your procedure, take any morning medicines NOT listed above.  You may use sips of water.  5. Plan to go home the same day, you will only stay overnight if medically necessary. 6. Bring a current list of your medications and current insurance cards. 7. You MUST have a responsible person to drive you home. 8. Someone MUST be with you the first 24 hours after you arrive home or your discharge will be delayed. 9. Please wear clothes that are easy to get on and off and wear slip-on shoes.  Thank you for  allowing [place] to care for you!   -- [facility]   Follow-Up in: 1 MONTH  At the [facility], you and your health needs are our priority. We have a designated team specialized in the treatment of Heart Failure. This Care Team includes your primary Heart Failure Specialized Cardiologist (physician), Advanced Practice Providers (APPs- Physician Assistants and Nurse Practitioners), and Pharmacist who all work together to provide you with the care you need, when you need it.   You may see any of the following providers on your designated Care Team at your next follow up:  Dr. Arvilla Meres Dr. Marca Ancona Dr. Marcos Eke, NP Robbie Lis, [place] [facility] [place], [place] Brynda Peon, NP Karle Plumber, PharmD   Please be sure to bring in all your medications bottles to every appointment.   Need to Contact us:  If you have any questions or concerns before your next appointment please send [place] a message through [place] or call our office at [phone].    TO LEAVE A MESSAGE FOR THE NURSE SELECT OPTION 2, PLEASE LEAVE A MESSAGE INCLUDING: YOUR NAME DATE OF BIRTH CALL BACK NUMBER REASON FOR CALL**this is important as we prioritize the call backs  YOU WILL RECEIVE A CALL BACK THE SAME DAY AS LONG AS YOU CALL BEFORE 4:00 PM

## 2023-08-10 NOTE — Progress Notes (Signed)
PCP: Carylon Perches, MD Cardiology: Dr. Ladona Ridgel HF Cardiology: Dr. Shirlee Latch  77 y.o. with history of paroxysmal atrial fibrillation, COPD and chronic bronchiectasis, and diastolic CHF with prominent RV failure was referred by Dr. Ladona Ridgel for evaluation of CHF and pulmonary hypertension. Patient is on 3-4 L home oxygen and uses Bipap at night.  He quit smoking in 1985 but has COPD/chronic bronchitis.  He also has bronchiectasis with history of mucus plugging/lobar collapse.  He had a remote atrial fibrillation ablation and has been on dofetilide.  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    In 4/24, he tripped, fell, and broke several left-sided ribs.  He ended up with left lower lobe collapse due to mucus plugging.  This resolved over time. However, he has been considerably more short of breath since then.  Echo was done in 7/24, showing EF 55-60%, grade 2 diastolic dysfunction, moderate RV enlargement with mild RV dysfunction, PASP 96 mmHg, severe biatrial enlargement, mild-moderate MR, IVC dilated.  Patient is short of breath walking < 100 feet, he gets winded walking across his house.  This has worsened over the last few months.  He is very short of breath carrying laundary, used to be able to do this with no problem several months ago. Short of breath walking from car to restaurant or store. He has orthopnea and elevates the head of his bed.  No typical chest pain but does feel heaviness in his chest at times. No lightheadedness or syncope.  He is in NSR today with no palpitations.   St Jude device interrogation: 13% v-paced, 75% a-paced, 20% AF  Labs (2/24): BNP 231, K 4, creatinine 1.04  ECG (personally reviewed): a-paced, RBBB with QTc 534 msec  PMH: 1. Atrial fibrillation: Paroxysmal.  Remote AF ablation.  Now on dofetilide.  2. GERD 3. Type 2 diabetes 4. HTN 5. Hypothyroidism 6. Hyperlipidemia 7. COPD: Chronic bronchitis.  Quit smoking 1985.  On home oxygen 3-4 L.   8. Chronic  bronchiectasis 9. OSA: Uses Bipap 10. 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  11. Sick sinus syndrome: St Jude PPM  Social History   Socioeconomic History   Marital status: Widowed    Spouse name: Not on file   Number of children: Not on file   Years of education: Not on file   Highest education level: Not on file  Occupational History   Not on file  Tobacco Use   Smoking status: Former    Current packs/day: 0.00    Average packs/day: 3.0 packs/day for 26.0 years (77.9 ttl pk-yrs)    Types: Cigarettes    Start date: 02/20/1958    Quit date: 02/12/1984    Years since quitting: 39.5   Smokeless tobacco: Former    Types: Chew   Tobacco comments:    Former smoker 08/30/2021  Vaping Use   Vaping status: Never Used  Substance and Sexual Activity   Alcohol use: Yes    Alcohol/week: 2.0 - 4.0 standard drinks of alcohol    Types: 1 - 2 Glasses of wine, 1 - 2 Cans of beer per week    Comment: 1 glass of wine or beer 3 times a week 12/26/22   Drug use: No   Sexual activity: Not Currently  Other Topics Concern   Not on file  Social History  Narrative   Regular exercise: No   Social Determinants of Health   Financial Resource Strain: Not on file  Food Insecurity: No Food Insecurity (01/13/2023)   Hunger Vital Sign    Worried About Running Out of Food in the Last Year: Never true    Ran Out of Food in the Last Year: Never true  Transportation Needs: No Transportation Needs (01/13/2023)   PRAPARE - Administrator, Civil Service (Medical): No    Lack of Transportation (Non-Medical): No  Physical Activity: Not on file  Stress: Not on file  Social Connections: Not on file  Intimate Partner Violence: Not At Risk (01/13/2023)   Humiliation, Afraid, Rape, and Kick questionnaire    Fear  of Current or Ex-Partner: No    Emotionally Abused: No    Physically Abused: No    Sexually Abused: No   Family History  Problem Relation Age of Onset   Liver cancer Mother    Pancreatic cancer Mother    Colon cancer Mother    Hypertension Father    Stroke Father    Other Father 39       Sudden Cardiac death   Heart attack Father    Cancer Sister        brain   Diabetes Sister    Colon cancer Maternal Aunt    Colon polyps Neg Hx    ROS: All systems reviewed and negative except as per HPI.   Current Outpatient Medications  Medication Sig Dispense Refill   acetaminophen (TYLENOL) 500 MG tablet Take 500 mg by mouth 3 (three) times daily.     albuterol (PROVENTIL) (2.5 MG/3ML) 0.083% nebulizer solution TAKE 1 VIAL VIA NEBULIZATION EVERY 6 HOURS AS NEEDED FOR WHEEZING OR SHORTNESS OF BREATH. 90 mL 2   albuterol (VENTOLIN HFA) 108 (90 Base) MCG/ACT inhaler Inhale 2 puffs into the lungs every 4 (four) hours as needed for shortness of breath (only if you can't catch your breath/ asthma). 18 g 5   atorvastatin (LIPITOR) 20 MG tablet Take 20 mg by mouth at bedtime.     bisoprolol (ZEBETA) 5 MG tablet TAKE 1 AND 1/2 TABLET BY MOUTH TWICE DAILY. (Patient taking differently: Take 5 mg by mouth See admin instructions. Take 1 & 1/2 tablets by mouth twice daily) 90 tablet 3   BREZTRI AEROSPHERE 160-9-4.8 MCG/ACT AERO INHALE 2 PUFFS INTO THE LUNGS (IN THE MORNING AND AT BEDTIME). 10.7 g 5   Budeson-Glycopyrrol-Formoterol (BREZTRI AEROSPHERE) 160-9-4.8 MCG/ACT AERO Inhale 2 puffs into the lungs in the morning and at bedtime. 10.7 g 5   Carboxymethylcellul-Glycerin (LUBRICATING EYE DROPS OP) Place 1 drop into the right eye daily as needed (dry eyes).     diltiazem (CARTIA XT) 120 MG 24 hr capsule TAKE (1) CAPSULE BY MOUTH DAILY, MAY TAKE A EXTRA CAPSULE DAILY FOR BREAKTHROUGH AFIB. (Patient taking differently: Take 120 mg by mouth See admin instructions. Take 1 capsule by mouth ,may take an extra  capsule for breakthrough afib) 180 capsule 3   dofetilide (TIKOSYN) 500 MCG capsule Take 1 capsule (500 mcg total) by mouth 2 (two) times daily. 180 capsule 3   Dupilumab (DUPIXENT) 300 MG/2ML SOPN Inject 300 mg into the skin every 14 (fourteen) days. 12 mL 1   EPINEPHrine 0.3 mg/0.3 mL IJ SOAJ injection Inject 0.3 mg into the muscle as needed for anaphylaxis. 1 each 5   glipiZIDE (GLUCOTROL XL) 5 MG 24 hr tablet Take 5 mg by mouth daily before breakfast.  hydrocortisone cream 0.5 % Apply 1 Application topically daily as needed for itching.     JARDIANCE 25 MG TABS tablet Take 25 mg by mouth daily before breakfast.     levothyroxine (SYNTHROID) 150 MCG tablet Take 150 mcg by mouth daily.     losartan (COZAAR) 50 MG tablet Take 50 mg by mouth at bedtime.     metFORMIN (GLUCOPHAGE-XR) 500 MG 24 hr tablet Take 500 mg by mouth in the morning and at bedtime.     Multiple Vitamin (MULTIVITAMIN) tablet Take 1 tablet by mouth daily.     mupirocin ointment (BACTROBAN) 2 % Apply 1 Application topically 2 (two) times daily. 22 g 0   omeprazole (PRILOSEC OTC) 20 MG tablet Take 20 mg by mouth every morning.     Respiratory Therapy Supplies (FLUTTER) DEVI Use as directed 1 each 0   rivaroxaban (XARELTO) 20 MG TABS tablet Take 20 mg by mouth at bedtime.     furosemide (LASIX) 40 MG tablet Take 1 tablet (40 mg total) by mouth in the morning AND 0.5 tablets (20 mg total) every evening. 90 tablet 3   No current facility-administered medications for this encounter.   BP (!) 140/62   Pulse 60   SpO2 92%  General: NAD Neck: JVP 8-9 cm, no thyromegaly or thyroid nodule.  Lungs: Distant BS CV: Nondisplaced PMI.  Heart regular S1/S2, no S3/S4, no murmur.  1+ edema 1/2 to knees bilaterally.  No carotid bruit.  Normal pedal pulses.  Abdomen: Soft, nontender, no hepatosplenomegaly, no distention.  Skin: Intact without lesions or rashes.  Neurologic: Alert and oriented x 3.  Psych: Normal affect. Extremities:  No clubbing or cyanosis.  HEENT: Normal.   Assessment/Plan: 1. Atrial fibrillation: Paroxysmal.  NSR today.  20% AF burden on device interrogation. Had remote AF ablation, now on dofetilide.  QTC 534 msec today, but accept < 550 msec with RBBB.  - Continue current dofetilide.  - Continue Xarelto - He is on bisoprolol and diltiazem CD for rate control if AF recurs. 2. Sick sinus syndrome: St Jude PPM.  3. 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  Echo (7/24) with EF 55-60%, grade 2 diastolic dysfunction, moderate RV enlargement with mild RV dysfunction, PASP 96 mmHg, severe biatrial enlargement, mild-moderate MR, IVC dilated.  Severe pulmonary hypertension and RV failure may be group 3 PH due to lung parenchymal disease (COPD, bronchiectasis).  Alternatively, there could be primarily group 2 PH in setting of PAF and diastolic CHF (severely dilated atria).  Group 1 PH seems unlikely. He looks volume overloaded on exam.  NYHA class IIIb symptoms, suspect due to both lung disease and CHF.  - Increase Lasix to 40 qam/20 qpm.  BMET/BNP today, BMET in 10 days.  - Continue Jardiance.  - I will arrange for RHC/LHC to assess filling pressures and contribution to his dyspnea from left heart failure, right heart failure, and pulmonary hypertention.  Given severe exertional symptoms, will assess for severe coronary disease as well.  Discussed risks/benefits with patient and he agrees to procedure.  Will hold Xarelto day of and day before cath.  4. COPD: Chronic bronchitis, quit smoking in 1985.  On 3-4 L home oxygen.  5. Chronic bronchiectasis: Has history of mucus plugging with lobar collapse.  6. OSA: Continue CPAP.   Followup 1 month.   Marca Ancona 08/10/2023

## 2023-08-14 ENCOUNTER — Ambulatory Visit (INDEPENDENT_AMBULATORY_CARE_PROVIDER_SITE_OTHER): Payer: Medicare Other

## 2023-08-14 DIAGNOSIS — I495 Sick sinus syndrome: Secondary | ICD-10-CM | POA: Diagnosis not present

## 2023-08-14 LAB — CUP PACEART REMOTE DEVICE CHECK
Battery Remaining Longevity: 97 mo
Battery Remaining Percentage: 93 %
Battery Voltage: 2.99 V
Brady Statistic AP VP Percent: 4 %
Brady Statistic AP VS Percent: 88 %
Brady Statistic AS VP Percent: 1 %
Brady Statistic AS VS Percent: 6.9 %
Brady Statistic RA Percent Paced: 75 %
Brady Statistic RV Percent Paced: 13 %
Date Time Interrogation Session: 20240816020015
Implantable Lead Connection Status: 753985
Implantable Lead Connection Status: 753985
Implantable Lead Implant Date: 20051106
Implantable Lead Implant Date: 20051106
Implantable Lead Location: 753859
Implantable Lead Location: 753860
Implantable Pulse Generator Implant Date: 20230710
Lead Channel Impedance Value: 350 Ohm
Lead Channel Impedance Value: 700 Ohm
Lead Channel Pacing Threshold Amplitude: 0.5 V
Lead Channel Pacing Threshold Amplitude: 0.75 V
Lead Channel Pacing Threshold Pulse Width: 0.5 ms
Lead Channel Pacing Threshold Pulse Width: 0.5 ms
Lead Channel Sensing Intrinsic Amplitude: 12 mV
Lead Channel Sensing Intrinsic Amplitude: 2.5 mV
Lead Channel Setting Pacing Amplitude: 2 V
Lead Channel Setting Pacing Amplitude: 2.5 V
Lead Channel Setting Pacing Pulse Width: 0.5 ms
Lead Channel Setting Sensing Sensitivity: 2 mV
Pulse Gen Model: 2272
Pulse Gen Serial Number: 8099041

## 2023-08-18 ENCOUNTER — Encounter: Payer: Self-pay | Admitting: Pulmonary Disease

## 2023-08-19 ENCOUNTER — Other Ambulatory Visit (HOSPITAL_COMMUNITY): Payer: Self-pay

## 2023-08-21 ENCOUNTER — Other Ambulatory Visit (HOSPITAL_COMMUNITY): Payer: Self-pay

## 2023-08-24 ENCOUNTER — Telehealth (HOSPITAL_COMMUNITY): Payer: Self-pay | Admitting: *Deleted

## 2023-08-24 NOTE — Telephone Encounter (Signed)
R/L HC sch for 8/27 is still in clinical review, cath resch to Fri 8/30 at 10:30 AM with 8:30 am arrival (Xarelto needs to be held [facility] 8/29 and Fri 8/30, metformin held Fri, Sat, & Sun, Jardiance, Glipizide, and Lasix held Fri AM)  Have left mess procedure resch call office back to discuss on both home and mobile numbers, mychart mess sent

## 2023-08-25 NOTE — Telephone Encounter (Signed)
Appt rescheduled for 9/3 with Dr Shirlee Latch Mychart message sent to patient

## 2023-08-25 NOTE — Progress Notes (Signed)
Remote pacemaker transmission.   

## 2023-08-27 ENCOUNTER — Ambulatory Visit (HOSPITAL_COMMUNITY)
Admission: RE | Admit: 2023-08-27 | Discharge: 2023-08-27 | Disposition: A | Discharge status: Home / Self Care | Payer: Medicare Other | Source: Ambulatory Visit | Attending: Physician Assistant | Admitting: Physician Assistant

## 2023-08-27 ENCOUNTER — Encounter (HOSPITAL_COMMUNITY): Payer: Medicare Other | Admitting: Cardiology

## 2023-08-27 ENCOUNTER — Encounter (HOSPITAL_COMMUNITY): Payer: Self-pay | Admitting: Physician Assistant

## 2023-08-27 ENCOUNTER — Encounter (HOSPITAL_COMMUNITY): Payer: Self-pay

## 2023-08-27 VITALS — BP 116/60 | HR 60 | Ht 69.0 in | Wt 222.0 lb

## 2023-08-27 DIAGNOSIS — I110 Hypertensive heart disease with heart failure: Secondary | ICD-10-CM | POA: Insufficient documentation

## 2023-08-27 DIAGNOSIS — I5032 Chronic diastolic (congestive) heart failure: Secondary | ICD-10-CM | POA: Insufficient documentation

## 2023-08-27 DIAGNOSIS — Z5181 Encounter for therapeutic drug level monitoring: Secondary | ICD-10-CM

## 2023-08-27 DIAGNOSIS — Z79899 Other long term (current) drug therapy: Secondary | ICD-10-CM

## 2023-08-27 DIAGNOSIS — I4819 Other persistent atrial fibrillation: Secondary | ICD-10-CM

## 2023-08-27 DIAGNOSIS — R9431 Abnormal electrocardiogram [ECG] [EKG]: Secondary | ICD-10-CM | POA: Diagnosis not present

## 2023-08-27 DIAGNOSIS — I495 Sick sinus syndrome: Secondary | ICD-10-CM | POA: Insufficient documentation

## 2023-08-27 DIAGNOSIS — D6869 Other thrombophilia: Secondary | ICD-10-CM | POA: Diagnosis not present

## 2023-08-27 LAB — MAGNESIUM: Magnesium: 2.1 mg/dL (ref 1.7–2.4)

## 2023-08-27 NOTE — Progress Notes (Signed)
Primary Care Physician: Carylon Perches, MD Primary Electrophysiologist: Dr Ladona Ridgel Referring Physician: Dr Johney Frame [facility]: Dr Verl Blalock is a 77 y.o. male with a history of SSS s/p PPM, atrial fibrillation, chronic diastolic CHF, HTN, COPD, DM, and GERD who presents for follow up in the [facility]. Patient was recently admitted with elevated temp and AMS and was diagnosed with severe sepsis 2/2 pneumonia. During his admission, he developed afib with RVR. He was given a bolus of flecainide which converted him to atrial flutter. Patient was fairly asymptomatic. He is on Xarelto for a CHADS2VASC score of 3.  The device clinic received an alert for an ongoing afib episode starting 04/09/21. He did have two alcohol drinks the night before and was also being treated for an URI. COVID test was negative. He was back in SR before follow up.  Patient was noted to be out of rhythm by the device clinic on 08/23/21 and underwent DCCV on 09/05/21. He had recurrent afib and underwent another DCCV on 08/06/22. The device clinic received an alert for an ongoing afib episode starting 12/07/22.   Patient is s/p dofetilide loading 1/16-1/19/24. He converted with the medication and did not require DCCV.   On follow up today, patient was seen by Dr Shirlee Latch 8/12 with more SOB on exertion. Patient is scheduled for a [facility] tomorrow. He remains in SR today with very low AF burden on his device. No bleeding issues on anticoagulation.   Today, he denies symptoms of palpitations, chest pain, orthopnea, PND, dizziness, presyncope, syncope, snoring, daytime somnolence, bleeding, or neurologic sequela. The patient is tolerating medications without difficulties and is otherwise without complaint today.    Atrial Fibrillation Risk Factors:  he does not have symptoms or diagnosis of sleep apnea. he does not have a history of rheumatic fever. he does not have a history of alcohol use. The patient  does not have a history of early familial atrial fibrillation or other arrhythmias.   Atrial Fibrillation Management history:  Previous antiarrhythmic drugs: flecainide, dofetilide  Previous cardioversions: 11/2016, 09/05/21 Previous ablations: none Anticoagulation history: Xarelto   Past Medical History:  Diagnosis Date   Allergic rhinitis    Aortic valve disorder    Asthma    since childhood- seasonal allergies induced   Cancer (HCC)    Skin cancer- squamous, basal   Carotid artery stenosis    Essential hypertension    Full dentures    GERD (gastroesophageal reflux disease)    H/O hiatal hernia    Hemorrhage of rectum    Hyperlipidemia    Hypothyroidism    Male circumcision    OSA (obstructive sleep apnea)    Osteoarthritis    Pacemaker    Oct 2005 in [place].   PAF (paroxysmal atrial fibrillation) (HCC)    Pneumonia    "several Times" 2015 last time   Primary localized osteoarthritis of right knee 08/11/2017   RBBB (right bundle branch block)    Sinoatrial node dysfunction (HCC)    Syncope    Tricuspid valve disorder    Type 2 diabetes mellitus (HCC)    Type II    Current Outpatient Medications  Medication Sig Dispense Refill   acetaminophen (TYLENOL) 500 MG tablet Take 1,000 mg by mouth 3 (three) times daily.     albuterol (PROVENTIL) (2.5 MG/3ML) 0.083% nebulizer solution TAKE 1 VIAL VIA NEBULIZATION EVERY 6 HOURS AS NEEDED FOR WHEEZING OR SHORTNESS OF BREATH. (Patient taking differently:  Take 2.5 mg by nebulization 2 (two) times daily.) 90 mL 2   albuterol (VENTOLIN HFA) 108 (90 Base) MCG/ACT inhaler Inhale 2 puffs into the lungs every 4 (four) hours as needed for shortness of breath (only if you can't catch your breath/ asthma). 18 g 5   atorvastatin (LIPITOR) 20 MG tablet Take 20 mg by mouth at bedtime.     bisoprolol (ZEBETA) 5 MG tablet TAKE 1 AND 1/2 TABLET BY MOUTH TWICE DAILY. 90 tablet 3   Budeson-Glycopyrrol-Formoterol (BREZTRI AEROSPHERE) 160-9-4.8  MCG/ACT AERO Inhale 2 puffs into the lungs in the morning and at bedtime. 10.7 g 5   Carboxymethylcellul-Glycerin (LUBRICATING EYE DROPS OP) Place 1 drop into the right eye 2 (two) times a week. Clear eyes     diltiazem (CARTIA XT) 120 MG 24 hr capsule TAKE (1) CAPSULE BY MOUTH DAILY, MAY TAKE A EXTRA CAPSULE DAILY FOR BREAKTHROUGH AFIB. 180 capsule 3   dofetilide (TIKOSYN) 500 MCG capsule Take 1 capsule (500 mcg total) by mouth 2 (two) times daily. 180 capsule 3   Dupilumab (DUPIXENT) 300 MG/2ML SOPN Inject 300 mg into the skin every 14 (fourteen) days. 12 mL 1   EPINEPHrine 0.3 mg/0.3 mL IJ SOAJ injection Inject 0.3 mg into the muscle as needed for anaphylaxis. 1 each 5   furosemide (LASIX) 40 MG tablet Take 1 tablet (40 mg total) by mouth in the morning AND 0.5 tablets (20 mg total) every evening. 90 tablet 3   glipiZIDE (GLUCOTROL XL) 5 MG 24 hr tablet Take 5 mg by mouth daily before breakfast.     hydrocortisone cream 0.5 % Apply 1 Application topically daily as needed for itching.     JARDIANCE 25 MG TABS tablet Take 25 mg by mouth daily before breakfast.     levothyroxine (SYNTHROID) 150 MCG tablet Take 150 mcg by mouth daily before breakfast.     losartan (COZAAR) 50 MG tablet Take 50 mg by mouth at bedtime.     metFORMIN (GLUCOPHAGE-XR) 500 MG 24 hr tablet Take 500 mg by mouth in the morning and at bedtime.     Multiple Vitamin (MULTIVITAMIN) tablet Take 1 tablet by mouth daily.     mupirocin ointment (BACTROBAN) 2 % Apply 1 Application topically 2 (two) times daily. 22 g 0   omeprazole (PRILOSEC OTC) 20 MG tablet Take 20 mg by mouth every morning.     PRESCRIPTION MEDICATION Thumper vest     Respiratory Therapy Supplies (FLUTTER) DEVI Use as directed 1 each 0   rivaroxaban (XARELTO) 20 MG TABS tablet Take 20 mg by mouth at bedtime.     sodium chloride (OCEAN) 0.65 % SOLN nasal spray Place 1 spray into both nostrils as needed for congestion.     No current facility-administered  medications for this encounter.    ROS- All systems are reviewed and negative except as per the HPI above.  Physical Exam: Vitals:   08/27/23 0949  BP: 116/60  Pulse: 60  Weight: 100.7 kg  Height: 5\' 9"  (1.753 m)     GEN: Well nourished, well developed in no acute distress NECK: No JVD; No carotid bruits CARDIAC: Regular rate and rhythm, no murmurs, rubs, gallops RESPIRATORY:  Clear to auscultation without rales, wheezing or rhonchi  ABDOMEN: Soft, non-tender, non-distended EXTREMITIES:  No edema; No deformity    Wt Readings from Last 3 Encounters:  08/27/23 100.7 kg  06/12/23 100.2 kg  05/12/23 103.1 kg    EKG today demonstrates A paced rhythm, RBBB,  1st degree AV block Vent. rate 60 BPM PR interval 222 ms QRS duration 158 ms QT/QTcB 488/488 ms  Echo 08/13/22 demonstrated   1. Left ventricular ejection fraction, by estimation, is 55 to 60%. The  left ventricle has normal function. The left ventricle has no regional  wall motion abnormalities. Left ventricular diastolic parameters are  consistent with Grade III diastolic dysfunction (restrictive). There is the interventricular septum is flattened in systole and diastole, consistent with right ventricular pressure and volume overload.   2. Right ventricular systolic function is moderately reduced. The right  ventricular size is moderately enlarged. There is severely elevated  pulmonary artery systolic pressure. The estimated right ventricular  systolic pressure is 67.6 mmHg.   3. Left atrial size was severely dilated.   4. Right atrial size was severely dilated.   5. A small pericardial effusion is present. The pericardial effusion is  posterior to the left ventricle.   6. The mitral valve is degenerative, mildly calcified and with somewhat  restricted posterior leaflet. Mild mitral valve regurgitation.   7. The aortic valve has an indeterminant number of cusps. There is  moderate calcification of the aortic valve.  Aortic valve regurgitation is  not visualized. 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  Aortic valve mean gradient measures 12.5 mmHg. Dimentionless index 0.28.   8. The inferior vena cava is dilated in size with >50% respiratory  variability, suggesting right atrial pressure of 8 mmHg.   Comparison(s): Prior images unable to be directly viewed.   Epic records are reviewed at length today   CHA2DS2-VASc Score = 4  The patient's score is based upon: CHF History: 0 HTN History: 1 Diabetes History: 1 Stroke History: 0 Vascular Disease History: 0 Age Score: 2 Gender Score: 0       ASSESSMENT AND PLAN: Persistent Atrial Fibrillation (ICD10:  I48.19) The patient's CHA2DS2-VASc score is 4, indicating a 4.8% annual risk of stroke.   S/p dofetilide admission 1/16-1/19/24 Patient appears to be maintaining SR. Continue dofetilide 500 mcg BID, QT stable Recent bmet reviewed, check magnesium today Continue Xarelto 20 mg daily, currently on hold for cath tomorrow. Continue diltiazem 120 mg daily  Continue bisoprolol 7.5 mg BID  Secondary Hypercoagulable State (ICD10:  D68.69) The patient is at significant risk for stroke/thromboembolism based upon his CHA2DS2-VASc Score of 4.  Continue Rivaroxaban (Xarelto).   SSS S/p PPM, followed by Dr Ladona Ridgel and the device clinic  HTN Stable on current regimen   Follow up with Dr Shirlee Latch and Dr Ladona Ridgel as scheduled. AF clinic in 6 months.    Jorja Loa PA-C [facility] [address] [place], [place] [address] [phone] 08/27/2023 10:57 AM

## 2023-08-28 ENCOUNTER — Encounter (HOSPITAL_COMMUNITY)
Admission: RE | Disposition: A | Discharge status: Home / Self Care | Payer: Self-pay | Source: Home / Self Care | Attending: Cardiology

## 2023-08-28 ENCOUNTER — Ambulatory Visit (HOSPITAL_COMMUNITY)
Admission: RE | Admit: 2023-08-28 | Discharge: 2023-08-28 | Disposition: A | Discharge status: Home / Self Care | Payer: Medicare Other | Source: Home / Self Care | Attending: Cardiology | Admitting: Cardiology

## 2023-08-28 ENCOUNTER — Other Ambulatory Visit: Payer: Self-pay

## 2023-08-28 ENCOUNTER — Telehealth: Payer: Self-pay | Admitting: Cardiology

## 2023-08-28 DIAGNOSIS — Z79899 Other long term (current) drug therapy: Secondary | ICD-10-CM | POA: Insufficient documentation

## 2023-08-28 DIAGNOSIS — I110 Hypertensive heart disease with heart failure: Secondary | ICD-10-CM | POA: Diagnosis not present

## 2023-08-28 DIAGNOSIS — Z9981 Dependence on supplemental oxygen: Secondary | ICD-10-CM | POA: Diagnosis not present

## 2023-08-28 DIAGNOSIS — Z950 Presence of cardiac pacemaker: Secondary | ICD-10-CM | POA: Diagnosis not present

## 2023-08-28 DIAGNOSIS — G4733 Obstructive sleep apnea (adult) (pediatric): Secondary | ICD-10-CM | POA: Diagnosis not present

## 2023-08-28 DIAGNOSIS — I2510 Atherosclerotic heart disease of native coronary artery without angina pectoris: Secondary | ICD-10-CM | POA: Insufficient documentation

## 2023-08-28 DIAGNOSIS — I272 Pulmonary hypertension, unspecified: Secondary | ICD-10-CM | POA: Diagnosis not present

## 2023-08-28 DIAGNOSIS — Z87891 Personal history of nicotine dependence: Secondary | ICD-10-CM | POA: Diagnosis not present

## 2023-08-28 DIAGNOSIS — I2721 Secondary pulmonary arterial hypertension: Secondary | ICD-10-CM | POA: Diagnosis not present

## 2023-08-28 DIAGNOSIS — I480 Paroxysmal atrial fibrillation: Secondary | ICD-10-CM | POA: Diagnosis not present

## 2023-08-28 DIAGNOSIS — I495 Sick sinus syndrome: Secondary | ICD-10-CM | POA: Diagnosis not present

## 2023-08-28 DIAGNOSIS — Z7901 Long term (current) use of anticoagulants: Secondary | ICD-10-CM | POA: Diagnosis not present

## 2023-08-28 DIAGNOSIS — J4489 Other specified chronic obstructive pulmonary disease: Secondary | ICD-10-CM | POA: Diagnosis not present

## 2023-08-28 DIAGNOSIS — I5032 Chronic diastolic (congestive) heart failure: Secondary | ICD-10-CM | POA: Diagnosis not present

## 2023-08-28 HISTORY — PX: RIGHT/LEFT HEART CATH AND CORONARY ANGIOGRAPHY: CATH118266

## 2023-08-28 LAB — POCT I-STAT EG7
Acid-Base Excess: 0 mmol/L (ref 0.0–2.0)
Acid-Base Excess: 0 mmol/L (ref 0.0–2.0)
Bicarbonate: 24.7 mmol/L (ref 20.0–28.0)
Bicarbonate: 25 mmol/L (ref 20.0–28.0)
Calcium, Ion: 1.12 mmol/L — ABNORMAL LOW (ref 1.15–1.40)
Calcium, Ion: 1.14 mmol/L — ABNORMAL LOW (ref 1.15–1.40)
HCT: 45 % (ref 39.0–52.0)
HCT: 45 % (ref 39.0–52.0)
Hemoglobin: 15.3 g/dL (ref 13.0–17.0)
Hemoglobin: 15.3 g/dL (ref 13.0–17.0)
O2 Saturation: 64 %
O2 Saturation: 65 %
Potassium: 3.8 mmol/L (ref 3.5–5.1)
Potassium: 3.9 mmol/L (ref 3.5–5.1)
Sodium: 143 mmol/L (ref 135–145)
Sodium: 143 mmol/L (ref 135–145)
TCO2: 26 mmol/L (ref 22–32)
TCO2: 26 mmol/L (ref 22–32)
pCO2, Ven: 38.1 mmHg — ABNORMAL LOW (ref 44–60)
pCO2, Ven: 39.7 mmHg — ABNORMAL LOW (ref 44–60)
pH, Ven: 7.406 (ref 7.25–7.43)
pH, Ven: 7.42 (ref 7.25–7.43)
pO2, Ven: 32 mmHg (ref 32–45)
pO2, Ven: 34 mmHg (ref 32–45)

## 2023-08-28 LAB — POCT I-STAT 7, (LYTES, BLD GAS, ICA,H+H)
Acid-base deficit: 1 mmol/L (ref 0.0–2.0)
Bicarbonate: 22.1 mmol/L (ref 20.0–28.0)
Calcium, Ion: 1.08 mmol/L — ABNORMAL LOW (ref 1.15–1.40)
HCT: 44 % (ref 39.0–52.0)
Hemoglobin: 15 g/dL (ref 13.0–17.0)
O2 Saturation: 95 %
Potassium: 3.8 mmol/L (ref 3.5–5.1)
Sodium: 143 mmol/L (ref 135–145)
TCO2: 23 mmol/L (ref 22–32)
pCO2 arterial: 32.7 mmHg (ref 32–48)
pH, Arterial: 7.438 (ref 7.35–7.45)
pO2, Arterial: 73 mmHg — ABNORMAL LOW (ref 83–108)

## 2023-08-28 LAB — GLUCOSE, CAPILLARY
Glucose-Capillary: 136 mg/dL — ABNORMAL HIGH (ref 70–99)
Glucose-Capillary: 158 mg/dL — ABNORMAL HIGH (ref 70–99)

## 2023-08-28 LAB — BASIC METABOLIC PANEL
Anion gap: 10 (ref 5–15)
BUN: 19 mg/dL (ref 8–23)
CO2: 27 mmol/L (ref 22–32)
Calcium: 8.8 mg/dL — ABNORMAL LOW (ref 8.9–10.3)
Chloride: 104 mmol/L (ref 98–111)
Creatinine, Ser: 0.97 mg/dL (ref 0.61–1.24)
GFR, Estimated: >60 mL/min (ref >60)
Glucose, Bld: 141 mg/dL — ABNORMAL HIGH (ref 70–99)
Potassium: 3.9 mmol/L (ref 3.5–5.1)
Sodium: 141 mmol/L (ref 135–145)

## 2023-08-28 SURGERY — RIGHT/LEFT HEART CATH AND CORONARY ANGIOGRAPHY
Anesthesia: LOCAL

## 2023-08-28 MED ORDER — VERAPAMIL HCL 2.5 MG/ML IV SOLN
INTRAVENOUS | Status: DC | PRN
  Administered 2023-08-28: 10 mL via INTRA_ARTERIAL

## 2023-08-28 MED ORDER — MIDAZOLAM HCL 2 MG/2ML IJ SOLN
INTRAMUSCULAR | Status: DC | PRN
  Administered 2023-08-28: 1 mg via INTRAVENOUS

## 2023-08-28 MED ORDER — HYDRALAZINE HCL 20 MG/ML IJ SOLN: 10.0000 mg | INTRAMUSCULAR | Status: DC | PRN

## 2023-08-28 MED ORDER — METHYLPREDNISOLONE SODIUM SUCC 125 MG IJ SOLR
125.0000 mg | Freq: Once | INTRAMUSCULAR | Status: AC
  Administered 2023-08-28: 125 mg via INTRAVENOUS

## 2023-08-28 MED ORDER — HEPARIN (PORCINE) IN NACL 1000-0.9 UT/500ML-% IV SOLN
INTRAVENOUS | Status: DC | PRN
  Administered 2023-08-28 (×2): 500 mL

## 2023-08-28 MED ORDER — ASPIRIN 81 MG PO CHEW: 81.0000 mg | CHEWABLE_TABLET | ORAL | Status: DC

## 2023-08-28 MED ORDER — LABETALOL HCL 5 MG/ML IV SOLN: 10.0000 mg | INTRAVENOUS | Status: DC | PRN

## 2023-08-28 MED ORDER — DIPHENHYDRAMINE HCL 50 MG/ML IJ SOLN: 25.0000 mg | Freq: Once | INTRAMUSCULAR | Status: AC

## 2023-08-28 MED ORDER — SODIUM CHLORIDE 0.9% FLUSH: 3.0000 mL | Freq: Two times a day (BID) | INTRAVENOUS | Status: DC

## 2023-08-28 MED ORDER — ACETAMINOPHEN 325 MG PO TABS: 650.0000 mg | ORAL_TABLET | ORAL | Status: DC | PRN

## 2023-08-28 MED ORDER — MIDAZOLAM HCL 2 MG/2ML IJ SOLN
INTRAMUSCULAR | Status: AC
  Filled 2023-08-28: qty 2

## 2023-08-28 MED ORDER — LIDOCAINE HCL (PF) 1 % IJ SOLN
INTRAMUSCULAR | Status: DC | PRN
  Administered 2023-08-28 (×2): 2 mL via INTRADERMAL

## 2023-08-28 MED ORDER — METHYLPREDNISOLONE SODIUM SUCC 125 MG IJ SOLR
INTRAMUSCULAR | Status: AC
  Filled 2023-08-28: qty 2

## 2023-08-28 MED ORDER — ONDANSETRON HCL 4 MG/2ML IJ SOLN: 4.0000 mg | Freq: Four times a day (QID) | INTRAMUSCULAR | Status: DC | PRN

## 2023-08-28 MED ORDER — VERAPAMIL HCL 2.5 MG/ML IV SOLN
INTRAVENOUS | Status: AC
  Filled 2023-08-28: qty 2

## 2023-08-28 MED ORDER — DIPHENHYDRAMINE HCL 50 MG/ML IJ SOLN
INTRAMUSCULAR | Status: AC
  Administered 2023-08-28: 25 mg via INTRAVENOUS
  Filled 2023-08-28: qty 1

## 2023-08-28 MED ORDER — HEPARIN SODIUM (PORCINE) 1000 UNIT/ML IJ SOLN
INTRAMUSCULAR | Status: AC
  Filled 2023-08-28: qty 10

## 2023-08-28 MED ORDER — SODIUM CHLORIDE 0.9 % IV SOLN: INTRAVENOUS | Status: DC

## 2023-08-28 MED ORDER — SODIUM CHLORIDE 0.9 % IV SOLN: 250.0000 mL | INTRAVENOUS | Status: DC | PRN

## 2023-08-28 MED ORDER — FENTANYL CITRATE (PF) 100 MCG/2ML IJ SOLN
INTRAMUSCULAR | Status: AC
  Filled 2023-08-28: qty 2

## 2023-08-28 MED ORDER — LIDOCAINE HCL (PF) 1 % IJ SOLN
INTRAMUSCULAR | Status: AC
  Filled 2023-08-28: qty 30

## 2023-08-28 MED ORDER — SODIUM CHLORIDE 0.9% FLUSH: 3.0000 mL | INTRAVENOUS | Status: DC | PRN

## 2023-08-28 MED ORDER — ASPIRIN 81 MG PO CHEW
81.0000 mg | CHEWABLE_TABLET | ORAL | Status: AC
  Administered 2023-08-28: 81 mg via ORAL
  Filled 2023-08-28: qty 1

## 2023-08-28 MED ORDER — FENTANYL CITRATE (PF) 100 MCG/2ML IJ SOLN
INTRAMUSCULAR | Status: DC | PRN
  Administered 2023-08-28: 25 ug via INTRAVENOUS

## 2023-08-28 MED ORDER — IOHEXOL 350 MG/ML SOLN
INTRAVENOUS | Status: DC | PRN
  Administered 2023-08-28: 82 mL via INTRA_ARTERIAL

## 2023-08-28 MED ORDER — HEPARIN SODIUM (PORCINE) 1000 UNIT/ML IJ SOLN
INTRAMUSCULAR | Status: DC | PRN
  Administered 2023-08-28: 5000 [IU] via INTRAVENOUS

## 2023-08-28 SURGICAL SUPPLY — 11 items
CATH 5FR JL3.5 JR4 ANG PIG MP (CATHETERS) IMPLANT
CATH BALLN WEDGE 5F 110CM (CATHETERS) IMPLANT
CATH INFINITI 5 FR 3DRC (CATHETERS) IMPLANT
DEVICE RAD COMP TR BAND LRG (VASCULAR PRODUCTS) IMPLANT
GLIDESHEATH SLEND SS 6F .021 (SHEATH) IMPLANT
GUIDEWIRE INQWIRE 1.5J.035X260 (WIRE) IMPLANT
INQWIRE 1.5J .035X260CM (WIRE) ×1
KIT SYRINGE INJ CVI SPIKEX1 (MISCELLANEOUS) IMPLANT
PACK CARDIAC CATHETERIZATION (CUSTOM PROCEDURE TRAY) ×1 IMPLANT
SET ATX-X65L (MISCELLANEOUS) IMPLANT
SHEATH GLIDE SLENDER 4/5FR (SHEATH) IMPLANT

## 2023-08-28 NOTE — Telephone Encounter (Signed)
Patient called in reporting he had a cardiac cath today at [facility]. Reports he had 2 IVs in the R arm that were removed. Got dressed and came home. While removing his shirt he noted an IV in the left wrist that was not removed. Spoke with [facility] about patient coming to the ED to have removed since he lives in [place], they will do. Information to be passed to on-coming shift. Patient voiced understanding and thanked me for callback.

## 2023-08-28 NOTE — Interval H&P Note (Signed)
History and Physical Interval Note:  08/28/2023 10:02 AM  Clarence Dawson  has presented today for surgery, with the diagnosis of hp.  The various methods of treatment have been discussed with the patient and family. After consideration of risks, benefits and other options for treatment, the patient has consented to  Procedure(s): RIGHT/LEFT HEART CATH AND CORONARY ANGIOGRAPHY (N/A) as a surgical intervention.  The patient's history has been reviewed, patient examined, no change in status, stable for surgery.  I have reviewed the patient's chart and labs.  Questions were answered to the patient's satisfaction.     Silverio Hagan [employer]

## 2023-08-28 NOTE — Discharge Instructions (Signed)
NO METFORMIN FOR 2 DAYS 

## 2023-09-01 ENCOUNTER — Ambulatory Visit (HOSPITAL_COMMUNITY)
Admission: RE | Admit: 2023-09-01 | Discharge: 2023-09-01 | Disposition: A | Discharge status: Home / Self Care | Payer: Medicare Other | Source: Ambulatory Visit | Attending: Cardiology | Admitting: Cardiology

## 2023-09-01 ENCOUNTER — Encounter: Payer: Self-pay | Admitting: Pulmonary Disease

## 2023-09-01 ENCOUNTER — Encounter (HOSPITAL_COMMUNITY): Payer: Self-pay | Admitting: Cardiology

## 2023-09-01 VITALS — BP 130/60 | HR 60 | Wt 220.6 lb

## 2023-09-01 DIAGNOSIS — G4733 Obstructive sleep apnea (adult) (pediatric): Secondary | ICD-10-CM | POA: Diagnosis not present

## 2023-09-01 DIAGNOSIS — J449 Chronic obstructive pulmonary disease, unspecified: Secondary | ICD-10-CM | POA: Diagnosis not present

## 2023-09-01 DIAGNOSIS — Z79899 Other long term (current) drug therapy: Secondary | ICD-10-CM | POA: Insufficient documentation

## 2023-09-01 DIAGNOSIS — I110 Hypertensive heart disease with heart failure: Secondary | ICD-10-CM | POA: Diagnosis not present

## 2023-09-01 DIAGNOSIS — Z950 Presence of cardiac pacemaker: Secondary | ICD-10-CM | POA: Diagnosis not present

## 2023-09-01 DIAGNOSIS — I5032 Chronic diastolic (congestive) heart failure: Secondary | ICD-10-CM | POA: Insufficient documentation

## 2023-09-01 DIAGNOSIS — Z7901 Long term (current) use of anticoagulants: Secondary | ICD-10-CM | POA: Diagnosis not present

## 2023-09-01 DIAGNOSIS — E782 Mixed hyperlipidemia: Secondary | ICD-10-CM

## 2023-09-01 DIAGNOSIS — I2510 Atherosclerotic heart disease of native coronary artery without angina pectoris: Secondary | ICD-10-CM | POA: Insufficient documentation

## 2023-09-01 DIAGNOSIS — I272 Pulmonary hypertension, unspecified: Secondary | ICD-10-CM | POA: Insufficient documentation

## 2023-09-01 DIAGNOSIS — J479 Bronchiectasis, uncomplicated: Secondary | ICD-10-CM | POA: Insufficient documentation

## 2023-09-01 DIAGNOSIS — Z8249 Family history of ischemic heart disease and other diseases of the circulatory system: Secondary | ICD-10-CM | POA: Insufficient documentation

## 2023-09-01 DIAGNOSIS — I480 Paroxysmal atrial fibrillation: Secondary | ICD-10-CM | POA: Diagnosis not present

## 2023-09-01 DIAGNOSIS — Z87891 Personal history of nicotine dependence: Secondary | ICD-10-CM | POA: Diagnosis not present

## 2023-09-01 DIAGNOSIS — I495 Sick sinus syndrome: Secondary | ICD-10-CM | POA: Insufficient documentation

## 2023-09-01 LAB — LIPID PANEL
Cholesterol: 152 mg/dL (ref 0–200)
HDL: 34 mg/dL — ABNORMAL LOW (ref >40)
LDL Cholesterol: 73 mg/dL (ref 0–99)
Total CHOL/HDL Ratio: 4.5 ratio
Triglycerides: 224 mg/dL — ABNORMAL HIGH (ref <150)
VLDL: 45 mg/dL — ABNORMAL HIGH (ref 0–40)

## 2023-09-01 LAB — BASIC METABOLIC PANEL
Anion gap: 12 (ref 5–15)
BUN: 19 mg/dL (ref 8–23)
CO2: 27 mmol/L (ref 22–32)
Calcium: 9.1 mg/dL (ref 8.9–10.3)
Chloride: 102 mmol/L (ref 98–111)
Creatinine, Ser: 0.96 mg/dL (ref 0.61–1.24)
GFR, Estimated: >60 mL/min (ref >60)
Glucose, Bld: 163 mg/dL — ABNORMAL HIGH (ref 70–99)
Potassium: 4.1 mmol/L (ref 3.5–5.1)
Sodium: 141 mmol/L (ref 135–145)

## 2023-09-01 LAB — BRAIN NATRIURETIC PEPTIDE: B Natriuretic Peptide: 225.8 pg/mL — ABNORMAL HIGH (ref 0.0–100.0)

## 2023-09-01 NOTE — Patient Instructions (Signed)
Medication Changes:  None, continue current medications  Lab Work:  Labs done today, your results will be available in MyChart, we will contact you for abnormal readings.  Testing/Procedures:  none  Referrals:  You have been referred to Pulmonary Rehab at [facility], they will call you to schedule  Special Instructions // Education:  Do the following things EVERYDAY: Weigh yourself in the morning before breakfast. Write it down and keep it in a log. Take your medicines as prescribed Eat low salt foods--Limit salt (sodium) to 2000 mg per day.  Stay as active as you can everyday Limit all fluids for the day to less than 2 liters   Follow-Up in: 3 months  At the [facility], you and your health needs are our priority. We have a designated team specialized in the treatment of Heart Failure. This Care Team includes your primary Heart Failure Specialized Cardiologist (physician), Advanced Practice Providers (APPs- Physician Assistants and Nurse Practitioners), and Pharmacist who all work together to provide you with the care you need, when you need it.   You may see any of the following providers on your designated Care Team at your next follow up:  Dr. Arvilla Meres Dr. Marca Ancona Dr. Marcos Eke, NP Robbie Lis, [place] [facility] [place], [place] Brynda Peon, NP Karle Plumber, PharmD   Please be sure to bring in all your medications bottles to every appointment.   Need to Contact us:  If you have any questions or concerns before your next appointment please send [place] a message through [place] or call our office at [phone].    TO LEAVE A MESSAGE FOR THE NURSE SELECT OPTION 2, PLEASE LEAVE A MESSAGE INCLUDING: YOUR NAME DATE OF BIRTH CALL BACK NUMBER REASON FOR CALL**this is important as we prioritize the call backs  YOU WILL RECEIVE A CALL BACK THE SAME DAY AS LONG AS YOU CALL BEFORE 4:00 PM

## 2023-09-01 NOTE — Progress Notes (Signed)
PCP: Carylon Perches, MD Cardiology: Dr. Ladona Ridgel HF Cardiology: Dr. Shirlee Latch  77 y.o. with history of paroxysmal atrial fibrillation, COPD and chronic bronchiectasis, and diastolic CHF with prominent RV failure was referred by Dr. Ladona Ridgel for evaluation of CHF and pulmonary hypertension. Patient is on 3-4 L home oxygen and uses Bipap at night.  He quit smoking in 1985 but has COPD/chronic bronchitis.  He also has bronchiectasis with history of mucus plugging/lobar collapse.  He had a remote atrial fibrillation ablation and has been on dofetilide.  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    In 4/24, he tripped, fell, and broke several left-sided ribs.  He ended up with left lower lobe collapse due to mucus plugging.  This resolved over time. However, he has been considerably more short of breath since then.  Echo was done in 7/24, showing EF 55-60%, grade 2 diastolic dysfunction, moderate RV enlargement with mild RV dysfunction, PASP 96 mmHg, severe biatrial enlargement, mild-moderate MR, IVC dilated.  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    Patient returns for followup of CHF.  Since increasing Lasix, breathing has been better.  Still short of breath walking 100-200 feet.  Generally doing ok walking around the house now.  Short of breath with stairs/inclines.   No chest pain.  He chronically elevates the head of his bed.  No palpitations, in NSR today.   St Jude device interrogation: 13% v-paced, 76% a-paced, 19% AF (since 10/23, rare since 5/24)  Labs (2/24): BNP 231, K 4, creatinine 1.04  ECG (personally reviewed): a-paced, RBBB with QTc 508 msec  PMH: 1. Atrial fibrillation: Paroxysmal.  Remote AF ablation.  Now on dofetilide.  2. GERD 3. Type 2 diabetes 4. HTN 5. Hypothyroidism 6. Hyperlipidemia 7. COPD: Chronic bronchitis.  Quit smoking 1985.  On home oxygen 3-4 L.   8. Chronic  bronchiectasis 9. OSA: Uses Bipap 10. 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  - RHC (8/24): mean RA 4, PA 67/23 mean 37, mean PCWP 11, CI 2.13, PVR 5.7 WU 11. Sick sinus syndrome: St Jude PPM 12. CAD: Coronary angiography (8/24) with 50% dLCx, 40% pLAD.   Social History   Socioeconomic History   Marital status: Widowed    Spouse name: Not on file   Number of children: Not on file   Years of education: Not on file   Highest education level: Not on file  Occupational History   Not on file  Tobacco Use   Smoking status: Former    Current packs/day: 0.00    Average packs/day: 3.0 packs/day for 26.0 years (77.9 ttl pk-yrs)    Types: Cigarettes    Start date: 02/20/1958    Quit date: 02/12/1984    Years since quitting: 39.5   Smokeless tobacco: Former    Types: Chew   Tobacco comments:    Former smoker 08/30/2021  Vaping Use   Vaping status: Never Used  Substance and Sexual Activity   Alcohol use: Yes    Alcohol/week: 2.0 - 4.0 standard drinks of alcohol    Types: 1 - 2 Glasses of wine, 1 - 2 Cans of beer per week    Comment: 1 glass of wine or beer 3 times a week 12/26/22   Drug  use: No   Sexual activity: Not Currently  Other Topics Concern   Not on file  Social History Narrative   Regular exercise: No   Social Determinants of Health   Financial Resource Strain: Not on file  Food Insecurity: No Food Insecurity (01/13/2023)   Hunger Vital Sign    Worried About Running Out of Food in the Last Year: Never true    Ran Out of Food in the Last Year: Never true  Transportation Needs: No Transportation Needs (01/13/2023)   PRAPARE - Administrator, Civil Service (Medical): No    Lack of Transportation (Non-Medical): No  Physical Activity: Not on file  Stress: Not on file  Social  Connections: Not on file  Intimate Partner Violence: Not At Risk (01/13/2023)   Humiliation, Afraid, Rape, and Kick questionnaire    Fear of Current or Ex-Partner: No    Emotionally Abused: No    Physically Abused: No    Sexually Abused: No   Family History  Problem Relation Age of Onset   Liver cancer Mother    Pancreatic cancer Mother    Colon cancer Mother    Hypertension Father    Stroke Father    Other Father 105       Sudden Cardiac death   Heart attack Father    Cancer Sister        brain   Diabetes Sister    Colon cancer Maternal Aunt    Colon polyps Neg Hx    ROS: All systems reviewed and negative except as per HPI.   Current Outpatient Medications  Medication Sig Dispense Refill   acetaminophen (TYLENOL) 500 MG tablet Take 1,000 mg by mouth 3 (three) times daily.     albuterol (PROVENTIL) (2.5 MG/3ML) 0.083% nebulizer solution TAKE 1 VIAL VIA NEBULIZATION EVERY 6 HOURS AS NEEDED FOR WHEEZING OR SHORTNESS OF BREATH. 90 mL 2   albuterol (VENTOLIN HFA) 108 (90 Base) MCG/ACT inhaler Inhale 2 puffs into the lungs every 4 (four) hours as needed for shortness of breath (only if you can't catch your breath/ asthma). 18 g 5   atorvastatin (LIPITOR) 20 MG tablet Take 20 mg by mouth at bedtime.     bisoprolol (ZEBETA) 5 MG tablet TAKE 1 AND 1/2 TABLET BY MOUTH TWICE DAILY. 90 tablet 3   Budeson-Glycopyrrol-Formoterol (BREZTRI AEROSPHERE) 160-9-4.8 MCG/ACT AERO Inhale 2 puffs into the lungs in the morning and at bedtime. 10.7 g 5   Carboxymethylcellul-Glycerin (LUBRICATING EYE DROPS OP) Place 1 drop into the right eye 2 (two) times a week. Clear eyes     diltiazem (CARTIA XT) 120 MG 24 hr capsule TAKE (1) CAPSULE BY MOUTH DAILY, MAY TAKE A EXTRA CAPSULE DAILY FOR BREAKTHROUGH AFIB. 180 capsule 3   dofetilide (TIKOSYN) 500 MCG capsule Take 1 capsule (500 mcg total) by mouth 2 (two) times daily. 180 capsule 3   Dupilumab (DUPIXENT) 300 MG/2ML SOPN Inject 300 mg into the skin every 14  (fourteen) days. 12 mL 1   EPINEPHrine 0.3 mg/0.3 mL IJ SOAJ injection Inject 0.3 mg into the muscle as needed for anaphylaxis. 1 each 5   furosemide (LASIX) 40 MG tablet Take 1 tablet (40 mg total) by mouth in the morning AND 0.5 tablets (20 mg total) every evening. 90 tablet 3   glipiZIDE (GLUCOTROL XL) 5 MG 24 hr tablet Take 5 mg by mouth daily before breakfast.     hydrocortisone cream 0.5 % Apply 1 Application topically daily as needed for  itching.     JARDIANCE 25 MG TABS tablet Take 25 mg by mouth daily before breakfast.     levothyroxine (SYNTHROID) 150 MCG tablet Take 150 mcg by mouth daily before breakfast.     losartan (COZAAR) 50 MG tablet Take 50 mg by mouth at bedtime.     metFORMIN (GLUCOPHAGE-XR) 500 MG 24 hr tablet Take 500 mg by mouth in the morning and at bedtime.     Multiple Vitamin (MULTIVITAMIN) tablet Take 1 tablet by mouth daily.     omeprazole (PRILOSEC OTC) 20 MG tablet Take 20 mg by mouth every morning.     PRESCRIPTION MEDICATION Thumper vest     Respiratory Therapy Supplies (FLUTTER) DEVI Use as directed 1 each 0   rivaroxaban (XARELTO) 20 MG TABS tablet Take 20 mg by mouth at bedtime.     sodium chloride (OCEAN) 0.65 % SOLN nasal spray Place 1 spray into both nostrils as needed for congestion.     No current facility-administered medications for this encounter.   BP 130/60   Pulse 60   Wt 100.1 kg (220 lb 9.6 oz)   SpO2 93% Comment: 4 l n/c  BMI 32.58 kg/m  General: NAD Neck: No JVD, no thyromegaly or thyroid nodule.  Lungs: Rhonchi bilaterally.  CV: Nondisplaced PMI.  Heart regular S1/S2, no S3/S4, no murmur.  1+ ankle edema.  No carotid bruit.  Normal pedal pulses.  Abdomen: Soft, nontender, no hepatosplenomegaly, no distention.  Skin: Intact without lesions or rashes.  Neurologic: Alert and oriented x 3.  Psych: Normal affect. Extremities: No clubbing or cyanosis.  HEENT: Normal.   Assessment/Plan: 1. Atrial fibrillation: Paroxysmal.  NSR today.   19% AF burden on device interrogation since 10/23 but only rare AF since 5/24. Had remote AF ablation, now on dofetilide.  QTC 508 msec today, but accept < 550 msec with RBBB.  - Continue current dofetilide.  - Continue Xarelto - He is on bisoprolol and diltiazem CD for rate control if AF recurs. 2. Sick sinus syndrome: St Jude PPM.  3. 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  Echo (7/24) with EF 55-60%, grade 2 diastolic dysfunction, moderate RV enlargement with mild RV dysfunction, PASP 96 mmHg, severe biatrial enlargement, mild-moderate MR, IVC dilated.  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  NYHA class III symptoms but improved on higher dose of Lasix.  He does not look significantly volume overloaded on exam. I suspect that his residual dyspnea is related to COPD.  - Continue Lasix 40 qam/20 qpm.  BMET/BNP today..  - Continue Jardiance.  4. COPD: Chronic bronchitis, quit smoking in 1985.  On 3-4 L home oxygen.  - I will refer to pulmonary rehab at [facility].  5. Chronic bronchiectasis: Has history of mucus plugging with lobar collapse.  6. OSA: Continue CPAP.  7. CAD: Coronary angiography in 8/24 showed 50% dLCx, 40% pLAD.  Medical management.  - No ASA given Xarelto use.  - Continue atorvastatin, check lipids.   Followup 3 months APP.   Marca Ancona 09/01/2023

## 2023-09-02 ENCOUNTER — Telehealth (HOSPITAL_COMMUNITY): Payer: Self-pay | Admitting: Cardiology

## 2023-09-02 ENCOUNTER — Encounter (HOSPITAL_COMMUNITY): Payer: Medicare Other

## 2023-09-02 NOTE — Telephone Encounter (Signed)
First and foremost we will need to get a up-to-date breathing tests so lets get that scheduled.  Second there is a new medication that he may benefit from called Ohtuvayre, lets get the PFTs ASAP and follow-up with me after that.  Then we can determine about further referrals.

## 2023-09-02 NOTE — Telephone Encounter (Signed)
Pulm rehab noted As of 09/02/23 patient is scheduled for orientation with [facility] 09/03/23

## 2023-09-02 NOTE — Telephone Encounter (Signed)
-----   Message from Marca Ancona sent at 09/01/2023 10:52 PM EDT ----- Please make sure he was set up for pulmonary rehab at [facility].

## 2023-09-03 ENCOUNTER — Encounter (HOSPITAL_COMMUNITY)
Admission: RE | Admit: 2023-09-03 | Discharge: 2023-09-03 | Disposition: A | Discharge status: Home / Self Care | Payer: Medicare Other | Source: Ambulatory Visit | Attending: Cardiology | Admitting: Cardiology

## 2023-09-03 VITALS — Ht 69.5 in | Wt 219.9 lb

## 2023-09-03 DIAGNOSIS — I272 Pulmonary hypertension, unspecified: Secondary | ICD-10-CM | POA: Diagnosis present

## 2023-09-03 NOTE — Progress Notes (Addendum)
Pulmonary Individual Treatment Plan  Patient Details  Name: Clarence Dawson MRN: [id number] Date of Birth: March 12, 1946 Referring Provider:   Flowsheet Row PULMONARY REHAB OTHER RESP ORIENTATION from 09/03/2023 in [facility] CARDIAC REHABILITATION  Referring Provider Marca Ancona MD       Initial Encounter Date:  Flowsheet Row PULMONARY REHAB OTHER RESP ORIENTATION from 09/03/2023 in [place] CARDIAC REHABILITATION  Date 09/03/23       Visit Diagnosis: Pulmonary hypertension (HCC)  Patient's Home Medications on Admission:   Current Outpatient Medications:    acetaminophen (TYLENOL) 500 MG tablet, Take 1,000 mg by mouth 3 (three) times daily., Disp: , Rfl:    albuterol (PROVENTIL) (2.5 MG/3ML) 0.083% nebulizer solution, TAKE 1 VIAL VIA NEBULIZATION EVERY 6 HOURS AS NEEDED FOR WHEEZING OR SHORTNESS OF BREATH., Disp: 90 mL, Rfl: 2   albuterol (VENTOLIN HFA) 108 (90 Base) MCG/ACT inhaler, Inhale 2 puffs into the lungs every 4 (four) hours as needed for shortness of breath (only if you can't catch your breath/ asthma)., Disp: 18 g, Rfl: 5   atorvastatin (LIPITOR) 20 MG tablet, Take 20 mg by mouth at bedtime., Disp: , Rfl:    bisoprolol (ZEBETA) 5 MG tablet, TAKE 1 AND 1/2 TABLET BY MOUTH TWICE DAILY., Disp: 90 tablet, Rfl: 3   Budeson-Glycopyrrol-Formoterol (BREZTRI AEROSPHERE) 160-9-4.8 MCG/ACT AERO, Inhale 2 puffs into the lungs in the morning and at bedtime., Disp: 10.7 g, Rfl: 5   Carboxymethylcellul-Glycerin (LUBRICATING EYE DROPS OP), Place 1 drop into the right eye 2 (two) times a week. Clear eyes, Disp: , Rfl:    diltiazem (CARTIA XT) 120 MG 24 hr capsule, TAKE (1) CAPSULE BY MOUTH DAILY, MAY TAKE A EXTRA CAPSULE DAILY FOR BREAKTHROUGH AFIB., Disp: 180 capsule, Rfl: 3   dofetilide (TIKOSYN) 500 MCG capsule, Take 1 capsule (500 mcg total) by mouth 2 (two) times daily., Disp: 180 capsule, Rfl: 3   Dupilumab (DUPIXENT) 300 MG/2ML SOPN, Inject 300 mg into the skin every 14 (fourteen)  days., Disp: 12 mL, Rfl: 1   EPINEPHrine 0.3 mg/0.3 mL IJ SOAJ injection, Inject 0.3 mg into the muscle as needed for anaphylaxis., Disp: 1 each, Rfl: 5   furosemide (LASIX) 40 MG tablet, Take 1 tablet (40 mg total) by mouth in the morning AND 0.5 tablets (20 mg total) every evening., Disp: 90 tablet, Rfl: 3   glipiZIDE (GLUCOTROL XL) 5 MG 24 hr tablet, Take 5 mg by mouth daily before breakfast., Disp: , Rfl:    hydrocortisone cream 0.5 %, Apply 1 Application topically daily as needed for itching., Disp: , Rfl:    JARDIANCE 25 MG TABS tablet, Take 25 mg by mouth daily before breakfast., Disp: , Rfl:    levothyroxine (SYNTHROID) 150 MCG tablet, Take 150 mcg by mouth daily before breakfast., Disp: , Rfl:    losartan (COZAAR) 50 MG tablet, Take 50 mg by mouth at bedtime., Disp: , Rfl:    metFORMIN (GLUCOPHAGE-XR) 500 MG 24 hr tablet, Take 500 mg by mouth in the morning and at bedtime., Disp: , Rfl:    Multiple Vitamin (MULTIVITAMIN) tablet, Take 1 tablet by mouth daily., Disp: , Rfl:    omeprazole (PRILOSEC OTC) 20 MG tablet, Take 20 mg by mouth every morning., Disp: , Rfl:    PRESCRIPTION MEDICATION, Thumper vest, Disp: , Rfl:    Respiratory Therapy Supplies (FLUTTER) DEVI, Use as directed, Disp: 1 each, Rfl: 0   rivaroxaban (XARELTO) 20 MG TABS tablet, Take 20 mg by mouth at bedtime., Disp: ,  Rfl:    sodium chloride (OCEAN) 0.65 % SOLN nasal spray, Place 1 spray into both nostrils as needed for congestion., Disp: , Rfl:   Past Medical History: Past Medical History:  Diagnosis Date   Allergic rhinitis    Aortic valve disorder    Asthma    since childhood- seasonal allergies induced   Cancer (HCC)    Skin cancer- squamous, basal   Carotid artery stenosis    Essential hypertension    Full dentures    GERD (gastroesophageal reflux disease)    H/O hiatal hernia    Hemorrhage of rectum    Hyperlipidemia    Hypothyroidism    Male circumcision    OSA (obstructive sleep apnea)     Osteoarthritis    Pacemaker    Oct 2005 in [place].   PAF (paroxysmal atrial fibrillation) (HCC)    Pneumonia    "several Times" 2015 last time   Primary localized osteoarthritis of right knee 08/11/2017   RBBB (right bundle branch block)    Sinoatrial node dysfunction (HCC)    Syncope    Tricuspid valve disorder    Type 2 diabetes mellitus (HCC)    Type II    Tobacco Use: Social History   Tobacco Use  Smoking Status Former   Current packs/day: 0.00   Average packs/day: 3.0 packs/day for 26.0 years (77.9 ttl pk-yrs)   Types: Cigarettes   Start date: 02/20/1958   Quit date: 02/12/1984   Years since quitting: 39.5  Smokeless Tobacco Former   Types: Chew  Tobacco Comments   Former smoker 08/30/2021    Labs: Review Flowsheet  More data exists      Latest Ref Rng & Units 10/20/2016 08/13/2017 09/18/2019 08/28/2023 09/01/2023  Labs for ITP Cardiac and Pulmonary Rehab  Cholestrol 0 - 200 mg/dL - - - - 409   LDL (calc) 0 - 99 mg/dL - - - - 73   HDL-C >81 mg/dL - - - - 34   Trlycerides <150 mg/dL - - - - 191   Hemoglobin A1c 4.8 - 5.6 % 8.1  7.2  7.2  - -  PH, Arterial 7.35 - 7.45 - - - 7.438  -  PCO2 arterial 32 - 48 mmHg - - - 32.7  -  Bicarbonate 20.0 - 28.0 mmol/L - - - 22.1  25.0  24.7  -  TCO2 22 - 32 mmol/L - - - 23  26  26   -  Acid-base deficit 0.0 - 2.0 mmol/L - - - 1.0  -  O2 Saturation % - - - 95  65  64  -    Details       Multiple values from one day are sorted in reverse-chronological order         Capillary Blood Glucose: Lab Results  Component Value Date   GLUCAP 158 (H) 08/28/2023   GLUCAP 136 (H) 08/28/2023   GLUCAP 148 (H) 01/16/2023   GLUCAP 110 (H) 01/15/2023   GLUCAP 148 (H) 01/15/2023     Pulmonary Assessment Scores:  UCSD: Self-administered rating of dyspnea associated with activities of daily living (ADLs) 6-point scale (0 = "not at all" to 5 = "maximal or unable to do because of breathlessness")  Scoring Scores range from 0 to 120.   Minimally important difference is 5 units  CAT: CAT can identify the health impairment of COPD patients and is better correlated with disease progression.  CAT has a scoring range of zero to 40. The CAT  score is classified into four groups of low (less than 10), medium (10 - 20), high (21-30) and very high (31-40) based on the impact level of disease on health status. A CAT score over 10 suggests significant symptoms.  A worsening CAT score could be explained by an exacerbation, poor medication adherence, poor inhaler technique, or progression of COPD or comorbid conditions.  CAT MCID is 2 points  mMRC: mMRC (Modified Medical Research Council) Dyspnea Scale is used to assess the degree of baseline functional disability in patients of respiratory disease due to dyspnea. No minimal important difference is established. A decrease in score of 1 point or greater is considered a positive change.   Pulmonary Function Assessment:   Exercise Target Goals: Exercise Program Goal: Individual exercise prescription set using results from initial 6 min walk test and THRR while considering  patient's activity barriers and safety.   Exercise Prescription Goal: Initial exercise prescription builds to 30-45 minutes a day of aerobic activity, 2-3 days per week.  Home exercise guidelines will be given to patient during program as part of exercise prescription that the participant will acknowledge.  Activity Barriers & Risk Stratification:  Activity Barriers & Cardiac Risk Stratification - 09/03/23 0844       Activity Barriers & Cardiac Risk Stratification   Activity Barriers Deconditioning;Muscular Weakness;Shortness of Breath;Other (comment);Arthritis;Left Knee Replacement;Right Knee Replacement;Joint Problems;Balance Concerns;History of Falls;Decreased Ventricular Function    Comments grip effected by arthritis, bilateral knee arth, and shoulders             6 Minute Walk:  6 Minute Walk     Row  Name 09/03/23 1012         6 Minute Walk   Phase Initial     Distance 1120 feet     Walk Time 5.17 minutes     # of Rest Breaks 1  50 sec     MPH 2.46     METS 1.69     RPE 14     Perceived Dyspnea  2     VO2 Peak 5.91     Symptoms Yes (comment)     Comments SOB, calves painful 8/10     Resting HR 60 bpm     Resting BP 124/72     Resting Oxygen Saturation  92 %     Exercise Oxygen Saturation  during 6 min walk 87 %     Max Ex. HR 64 bpm     Max Ex. BP 146/64     2 Minute Post BP 132/70       Interval HR   1 Minute HR 60     2 Minute HR 61     3 Minute HR 62     4 Minute HR 64     5 Minute HR 60     6 Minute HR 61     2 Minute Post HR 60     Interval Heart Rate? Yes       Interval Oxygen   Interval Oxygen? Yes     Baseline Oxygen Saturation % 92 %     1 Minute Oxygen Saturation % 87 %     1 Minute Liters of Oxygen 4 L  pulsed     2 Minute Oxygen Saturation % 87 %     2 Minute Liters of Oxygen 4 L     3 Minute Oxygen Saturation % 88 %     3 Minute Liters of Oxygen 4 L  4 Minute Oxygen Saturation % 89 %     4 Minute Liters of Oxygen 4 L     5 Minute Oxygen Saturation % 87 %     5 Minute Liters of Oxygen 4 L     6 Minute Oxygen Saturation % 88 %     6 Minute Liters of Oxygen 4 L     2 Minute Post Oxygen Saturation % 90 %     2 Minute Post Liters of Oxygen 4 L              Oxygen Initial Assessment:  Oxygen Initial Assessment - 09/03/23 0849       Home Oxygen   Home Oxygen Device Portable Concentrator;Home Concentrator    Sleep Oxygen Prescription BiPAP;Continuous    Liters per minute 4    Home Exercise Oxygen Prescription Continuous    Liters per minute 4    Home Resting Oxygen Prescription Continuous    Liters per minute 3    Compliance with Home Oxygen Use Yes      Initial 6 min Walk   Oxygen Used Pulsed;Portable Concentrator    Liters per minute 4      Program Oxygen Prescription   Program Oxygen Prescription Continuous;E-Tanks    Liters  per minute 3      Intervention   Short Term Goals To learn and exhibit compliance with exercise, home and travel O2 prescription;To learn and understand importance of monitoring SPO2 with pulse oximeter and demonstrate accurate use of the pulse oximeter.;To learn and understand importance of maintaining oxygen saturations>88%;To learn and demonstrate proper pursed lip breathing techniques or other breathing techniques. ;To learn and demonstrate proper use of respiratory medications    Long  Term Goals Exhibits compliance with exercise, home  and travel O2 prescription;Maintenance of O2 saturations>88%;Compliance with respiratory medication;Verbalizes importance of monitoring SPO2 with pulse oximeter and return demonstration;Exhibits proper breathing techniques, such as pursed lip breathing or other method taught during program session;Demonstrates proper use of MDI's             Oxygen Re-Evaluation:  Oxygen Re-Evaluation     Row Name 09/03/23 1049             Goals/Expected Outcomes   Short Term Goals To learn and demonstrate proper pursed lip breathing techniques or other breathing techniques.        Long  Term Goals Exhibits proper breathing techniques, such as pursed lip breathing or other method taught during program session       Comments Reviewed PLB technique with pt.  Talked about how it works and it's importance in maintaining their exercise saturations.       Goals/Expected Outcomes Short: Become more profiecient at using PLB.   Long: Become independent at using PLB.                Oxygen Discharge (Final Oxygen Re-Evaluation):  Oxygen Re-Evaluation - 09/03/23 1049       Goals/Expected Outcomes   Short Term Goals To learn and demonstrate proper pursed lip breathing techniques or other breathing techniques.     Long  Term Goals Exhibits proper breathing techniques, such as pursed lip breathing or other method taught during program session    Comments Reviewed PLB  technique with pt.  Talked about how it works and it's importance in maintaining their exercise saturations.    Goals/Expected Outcomes Short: Become more profiecient at using PLB.   Long: Become independent at using PLB.  Initial Exercise Prescription:  Initial Exercise Prescription - 09/03/23 1000       Date of Initial Exercise RX and Referring Provider   Date 09/03/23    Referring Provider Marca Ancona MD      Oxygen   Oxygen Continuous    Liters 4    Maintain Oxygen Saturation 88% or higher      Treadmill   MPH 2.2    Grade 0.5    Minutes 15    METs 2.84      REL-XR   Level 3    Speed 50    Minutes 15    METs 2.3      Prescription Details   Frequency (times per week) 3    Duration Progress to 30 minutes of continuous aerobic without signs/symptoms of physical distress      Intensity   THRR 40-80% of Max Heartrate 93-126    Ratings of Perceived Exertion 11-13    Perceived Dyspnea 0-4      Progression   Progression Continue to progress workloads to maintain intensity without signs/symptoms of physical distress.      Resistance Training   Training Prescription Yes    Weight 5 lb    Reps 10-15             Perform Capillary Blood Glucose checks as needed.  Exercise Prescription Changes:   Exercise Prescription Changes     Row Name 09/03/23 1000             Response to Exercise   Blood Pressure (Admit) 124/72       Blood Pressure (Exercise) 146/64       Blood Pressure (Exit) 120/62       Heart Rate (Admit) 60 bpm       Heart Rate (Exercise) 64 bpm       Heart Rate (Exit) 60 bpm       Oxygen Saturation (Admit) 92 %       Oxygen Saturation (Exercise) 87 %       Oxygen Saturation (Exit) 90 %       Rating of Perceived Exertion (Exercise) 14       Perceived Dyspnea (Exercise) 2       Symptoms SOB, calves pain 8/10       Comments walk test results                Exercise Comments:   Exercise Goals and Review:    Exercise Goals     Row Name 09/03/23 1031             Exercise Goals   Increase Physical Activity Yes       Intervention Provide advice, education, support and counseling about physical activity/exercise needs.;Develop an individualized exercise prescription for aerobic and resistive training based on initial evaluation findings, risk stratification, comorbidities and participant's personal goals.       Expected Outcomes Short Term: Attend rehab on a regular basis to increase amount of physical activity.;Long Term: Add in home exercise to make exercise part of routine and to increase amount of physical activity.;Long Term: Exercising regularly at least 3-5 days a week.       Increase Strength and Stamina Yes       Intervention Provide advice, education, support and counseling about physical activity/exercise needs.;Develop an individualized exercise prescription for aerobic and resistive training based on initial evaluation findings, risk stratification, comorbidities and participant's personal goals.       Expected Outcomes Short Term:  Increase workloads from initial exercise prescription for resistance, speed, and METs.;Short Term: Perform resistance training exercises routinely during rehab and add in resistance training at home;Long Term: Improve cardiorespiratory fitness, muscular endurance and strength as measured by increased METs and functional capacity ( )       Able to understand and use rate of perceived exertion (RPE) scale Yes       Intervention Provide education and explanation on how to use RPE scale       Expected Outcomes Short Term: Able to use RPE daily in rehab to express subjective intensity level;Long Term:  Able to use RPE to guide intensity level when exercising independently       Able to understand and use Dyspnea scale Yes       Intervention Provide education and explanation on how to use Dyspnea scale       Expected Outcomes Short Term: Able to use Dyspnea scale  daily in rehab to express subjective sense of shortness of breath during exertion;Long Term: Able to use Dyspnea scale to guide intensity level when exercising independently       Knowledge and understanding of Target Heart Rate Range (THRR) Yes       Intervention Provide education and explanation of THRR including how the numbers were predicted and where they are located for reference       Expected Outcomes Short Term: Able to state/look up THRR;Long Term: Able to use THRR to govern intensity when exercising independently;Short Term: Able to use daily as guideline for intensity in rehab       Able to check pulse independently Yes       Intervention Provide education and demonstration on how to check pulse in carotid and radial arteries.;Review the importance of being able to check your own pulse for safety during independent exercise       Expected Outcomes Short Term: Able to explain why pulse checking is important during independent exercise;Long Term: Able to check pulse independently and accurately       Understanding of Exercise Prescription Yes       Intervention Provide education, explanation, and written materials on patient's individual exercise prescription       Expected Outcomes Long Term: Able to explain home exercise prescription to exercise independently;Short Term: Able to explain program exercise prescription                Exercise Goals Re-Evaluation :  Exercise Goals Re-Evaluation     Row Name 09/03/23 1032             Exercise Goal Re-Evaluation   Exercise Goals Review Able to understand and use rate of perceived exertion (RPE) scale;Able to understand and use Dyspnea scale;Understanding of Exercise Prescription       Comments Reviewed RPE  and dyspnea scale and program prescription with pt today.  Pt voiced understanding and was given a copy of goals to take home.       Expected Outcomes Short: Use RPE daily to regulate intensity.  Long: Follow program  prescription                Discharge Exercise Prescription (Final Exercise Prescription Changes):  Exercise Prescription Changes - 09/03/23 1000       Response to Exercise   Blood Pressure (Admit) 124/72    Blood Pressure (Exercise) 146/64    Blood Pressure (Exit) 120/62    Heart Rate (Admit) 60 bpm    Heart Rate (Exercise) 64 bpm    Heart  Rate (Exit) 60 bpm    Oxygen Saturation (Admit) 92 %    Oxygen Saturation (Exercise) 87 %    Oxygen Saturation (Exit) 90 %    Rating of Perceived Exertion (Exercise) 14    Perceived Dyspnea (Exercise) 2    Symptoms SOB, calves pain 8/10    Comments walk test results             Nutrition:  Target Goals: Understanding of nutrition guidelines, daily intake of sodium 1500mg , cholesterol 200mg , calories 30% from fat and 7% or less from saturated fats, daily to have 5 or more servings of fruits and vegetables.  Biometrics:  Pre Biometrics - 09/03/23 1033       Pre Biometrics   Height 5' 9.5" (1.765 m)    Weight 99.7 kg    Waist Circumference 44 inches    Hip Circumference 42.5 inches    Waist to Hip Ratio 1.04 %    BMI (Calculated) 32.02    Grip Strength 21.7 kg    Single Leg Stand 2.3 seconds              Nutrition Therapy Plan and Nutrition Goals:  Nutrition Therapy & Goals - 09/03/23 0853       Intervention Plan   Intervention Prescribe, educate and counsel regarding individualized specific dietary modifications aiming towards targeted core components such as weight, hypertension, lipid management, diabetes, heart failure and other comorbidities.    Expected Outcomes Short Term Goal: Understand basic principles of dietary content, such as calories, fat, sodium, cholesterol and nutrients.;Long Term Goal: Adherence to prescribed nutrition plan.             Nutrition Assessments:  MEDIFICTS Score Key: ?70 Need to make dietary changes  40-70 Heart Healthy Diet ? 40 Therapeutic Level Cholesterol  Diet   Picture Your Plate Scores: <64 Unhealthy dietary pattern with much room for improvement. 41-50 Dietary pattern unlikely to meet recommendations for good health and room for improvement. 51-60 More healthful dietary pattern, with some room for improvement.  >60 Healthy dietary pattern, although there may be some specific behaviors that could be improved.    Nutrition Goals Re-Evaluation:   Nutrition Goals Discharge (Final Nutrition Goals Re-Evaluation):   Psychosocial: Target Goals: Acknowledge presence or absence of significant depression and/or stress, maximize coping skills, provide positive support system. Participant is able to verbalize types and ability to use techniques and skills needed for reducing stress and depression.  Initial Review & Psychosocial Screening:  Initial Psych Review & Screening - 09/03/23 0853       Initial Review   Current issues with Current Stress Concerns    Source of Stress Concerns Chronic Illness;Unable to perform yard/household activities;Unable to participate in former interests or hobbies;Family    Comments dealing with not being able to go and do like he wants to, loss two wives, last one in March, sister and husband living with them      Family Dynamics   Good Support System? Yes   daughter in law and grandkids live near by, sister and husband living with him currently     Barriers   Psychosocial barriers to participate in program The patient should benefit from training in stress management and relaxation.;Psychosocial barriers identified (see note)      Screening Interventions   Interventions Encouraged to exercise;To provide support and resources with identified psychosocial needs;Provide feedback about the scores to participant    Expected Outcomes Short Term goal: Utilizing psychosocial counselor, staff and physician  to assist with identification of specific Stressors or current issues interfering with healing process. Setting  desired goal for each stressor or current issue identified.;Long Term Goal: Stressors or current issues are controlled or eliminated.;Short Term goal: Identification and review with participant of any Quality of Life or Depression concerns found by scoring the questionnaire.;Long Term goal: The participant improves quality of Life and PHQ9 Scores as seen by post scores and/or verbalization of changes             Quality of Life Scores:  Scores of 19 and below usually indicate a poorer quality of life in these areas.  A difference of  2-3 points is a clinically meaningful difference.  A difference of 2-3 points in the total score of the Quality of Life Index has been associated with significant improvement in overall quality of life, self-image, physical symptoms, and general health in studies assessing change in quality of life.   PHQ-9: Review Flowsheet       09/03/2023  Depression screen PHQ 2/9  Decreased Interest 0  Down, Depressed, Hopeless 0  PHQ - 2 Score 0  Altered sleeping 0  Tired, decreased energy 0  Change in appetite 0  Feeling bad or failure about yourself  0  Trouble concentrating 0  Moving slowly or fidgety/restless 0  Suicidal thoughts 0  PHQ-9 Score 0  Difficult doing work/chores Not difficult at all    Details           Interpretation of Total Score  Total Score Depression Severity:  1-4 = Minimal depression, 5-9 = Mild depression, 10-14 = Moderate depression, 15-19 = Moderately severe depression, 20-27 = Severe depression   Psychosocial Evaluation and Intervention:  Psychosocial Evaluation - 09/03/23 0858       Psychosocial Evaluation & Interventions   Interventions Stress management education;Encouraged to exercise with the program and follow exercise prescription    Comments [name] is coming into pulmonary rehab for pulmonary hypertension.  He is a happy and positive person and eager to get going in the program. He had a bad fall back in April over  his oxygen tubing and broke three ribs and collapsed his lung.  Since his fall, he has had to rely on his oxygen more.  Previously, he could go a few hours around the house without it, but now he is on 3-4L constantly for any activity.  He has no history of anixety or depression but lost his 2nd wife to cancer in March of 2023 (1st wife to cancer in 2016).  He enjoys haning out with friends to play card and going to [employer] to read books.   He used to go to [facility] for exercise but the oxygen feels [place] to him currently but he would like to get back there.  His sister and her husband are currently living with him after they lost their condo, but he enjoys their company and meals together.  He has been working on weight loss and has lost 20 lb since June.  He would like to get down under 200 lb.  He has a new concentrator for oxygen that seems to be working better for him and his breathing.  He wants to get back to whre he was before his fall in April.  He would also like to try to tritrate down on his oxygen needs for activity.  He also has a daughter in law that lives locally with two grandsons that help with yard work.  They also  go out to eat at least once a week together.  He has only been doing some weights and walking in the house for acitivity and would like to get back to more regular exercise again.    Expected Outcomes Short: Attend rehab to build stamina and improve breathing Long: Continue to focus on positives and wean oxygen    Continue Psychosocial Services  Follow up required by staff             Psychosocial Re-Evaluation:   Psychosocial Discharge (Final Psychosocial Re-Evaluation):    Education: Education Goals: Education classes will be provided on a weekly basis, covering required topics. Participant will state understanding/return demonstration of topics presented.  Learning Barriers/Preferences:  Learning Barriers/Preferences - 09/03/23 1040       Learning  Barriers/Preferences   Learning Barriers Sight   contacts   Learning Preferences None             Education Topics: How Lungs Work and Diseases: - Discuss the anatomy of the lungs and diseases that can affect the lungs, such as COPD.   Exercise: -Discuss the importance of exercise, FITT principles of exercise, normal and abnormal responses to exercise, and how to exercise safely.   Environmental Irritants: -Discuss types of environmental irritants and how to limit exposure to environmental irritants.   Meds/Inhalers and oxygen: - Discuss respiratory medications, definition of an inhaler and oxygen, and the proper way to use an inhaler and oxygen.   Energy Saving Techniques: - Discuss methods to conserve energy and decrease shortness of breath when performing activities of daily living.    Bronchial Hygiene / Breathing Techniques: - Discuss breathing mechanics, pursed-lip breathing technique,  proper posture, effective ways to clear airways, and other functional breathing techniques   Cleaning Equipment: - Provides group verbal and written instruction about the health risks of elevated stress, cause of high stress, and healthy ways to reduce stress.   Nutrition I: Fats: - Discuss the types of cholesterol, what cholesterol does to the body, and how cholesterol levels can be controlled.   Nutrition II: Labels: -Discuss the different components of food labels and how to read food labels.   Respiratory Infections: - Discuss the signs and symptoms of respiratory infections, ways to prevent respiratory infections, and the importance of seeking medical treatment when having a respiratory infection.   Stress I: Signs and Symptoms: - Discuss the causes of stress, how stress may lead to anxiety and depression, and ways to limit stress.   Stress II: Relaxation: -Discuss relaxation techniques to limit stress.   Oxygen for Home/Travel: - Discuss how to prepare for travel  when on oxygen and proper ways to transport and store oxygen to ensure safety.   Knowledge Questionnaire Score:   Core Components/Risk Factors/Patient Goals at Admission:  Personal Goals and Risk Factors at Admission - 09/03/23 0852       Core Components/Risk Factors/Patient Goals on Admission    Weight Management Yes;Obesity;Weight Loss    Intervention Weight Management: Develop a combined nutrition and exercise program designed to reach desired caloric intake, while maintaining appropriate intake of nutrient and fiber, sodium and fats, and appropriate energy expenditure required for the weight goal.;Weight Management: Provide education and appropriate resources to help participant work on and attain dietary goals.;Weight Management/Obesity: Establish reasonable short term and long term weight goals.;Obesity: Provide education and appropriate resources to help participant work on and attain dietary goals.    Admit Weight 219 lb 14.4 oz (99.7 kg)    Goal  Weight: Short Term 215 lb (97.5 kg)    Goal Weight: Long Term 199 lb (90.3 kg)    Expected Outcomes Short Term: Continue to assess and modify interventions until short term weight is achieved;Long Term: Adherence to nutrition and physical activity/exercise program aimed toward attainment of established weight goal;Weight Loss: Understanding of general recommendations for a balanced deficit meal plan, which promotes 1-2 lb weight loss per week and includes a negative energy balance of [phone] kcal/d;Understanding recommendations for meals to include 15-35% energy as protein, 25-35% energy from fat, 35-60% energy from carbohydrates, less than 200mg  of dietary cholesterol, 20-35 gm of total fiber daily;Understanding of distribution of calorie intake throughout the day with the consumption of 4-5 meals/snacks    Improve shortness of breath with ADL's Yes    Intervention Provide education, individualized exercise plan and daily activity instruction to  help decrease symptoms of SOB with activities of daily living.    Expected Outcomes Short Term: Improve cardiorespiratory fitness to achieve a reduction of symptoms when performing ADLs;Long Term: Be able to perform more ADLs without symptoms or delay the onset of symptoms    Increase knowledge of respiratory medications and ability to use respiratory devices properly  Yes    Intervention Provide education and demonstration as needed of appropriate use of medications, inhalers, and oxygen therapy.    Expected Outcomes Short Term: Achieves understanding of medications use. Understands that oxygen is a medication prescribed by physician. Demonstrates appropriate use of inhaler and oxygen therapy.;Long Term: Maintain appropriate use of medications, inhalers, and oxygen therapy.    Diabetes Yes    Intervention Provide education about signs/symptoms and action to take for hypo/hyperglycemia.;Provide education about proper nutrition, including hydration, and aerobic/resistive exercise prescription along with prescribed medications to achieve blood glucose in normal ranges: Fasting glucose 65-99 mg/dL    Expected Outcomes Short Term: Participant verbalizes understanding of the signs/symptoms and immediate care of hyper/hypoglycemia, proper foot care and importance of medication, aerobic/resistive exercise and nutrition plan for blood glucose control.;Long Term: Attainment of HbA1C < 7%.    Heart Failure Yes    Intervention Provide a combined exercise and nutrition program that is supplemented with education, support and counseling about heart failure. Directed toward relieving symptoms such as shortness of breath, decreased exercise tolerance, and extremity edema.    Expected Outcomes Improve functional capacity of life;Short term: Attendance in program 2-3 days a week with increased exercise capacity. Reported lower sodium intake. Reported increased fruit and vegetable intake. Reports medication  compliance.;Short term: Daily weights obtained and reported for increase. Utilizing diuretic protocols set by physician.;Long term: Adoption of self-care skills and reduction of barriers for early signs and symptoms recognition and intervention leading to self-care maintenance.    Hypertension Yes    Intervention Provide education on lifestyle modifcations including regular physical activity/exercise, weight management, moderate sodium restriction and increased consumption of fresh fruit, vegetables, and low fat dairy, alcohol moderation, and smoking cessation.;Monitor prescription use compliance.    Expected Outcomes Short Term: Continued assessment and intervention until BP is < 140/80mm HG in hypertensive participants. < 130/55mm HG in hypertensive participants with diabetes, heart failure or chronic kidney disease.;Long Term: Maintenance of blood pressure at goal levels.    Lipids Yes    Intervention Provide education and support for participant on nutrition & aerobic/resistive exercise along with prescribed medications to achieve LDL 70mg , HDL >40mg .    Expected Outcomes Short Term: Participant states understanding of desired cholesterol values and is compliant with medications prescribed. Participant  is following exercise prescription and nutrition guidelines.;Long Term: Cholesterol controlled with medications as prescribed, with individualized exercise RX and with personalized nutrition plan. Value goals: LDL < 70mg , HDL > 40 mg.             Core Components/Risk Factors/Patient Goals Review:    Core Components/Risk Factors/Patient Goals at Discharge (Final Review):    ITP Comments:  ITP Comments     Row Name 09/03/23 1011           ITP Comments Patient attend orientation today.  Patient is attending[facility].  Documentation for diagnosis can be found in [facility] 09/01/23.  Reviewed medical chart, RPE/RPD, gym safety, and program guidelines.  Patient was fitted  to equipment they will be using during rehab.  Patient is scheduled to start exercise on 09/04/23 at 745.   Initial ITP created and sent for review and signature by Dr. Erick Blinks, Medical Director for Pulmonary Rehabilitation Program.                Comments: Initial ITP

## 2023-09-03 NOTE — Patient Instructions (Signed)
Patient Instructions  Patient Details  Name: Clarence Dawson MRN: [id number] Date of Birth: [birth year] Referring Provider:  Laurey Morale, MD  Below are your personal goals for exercise, nutrition, and risk factors. Our goal is to help you stay on track towards obtaining and maintaining these goals. We will be discussing your progress on these goals with you throughout the program.  Initial Exercise Prescription:  Initial Exercise Prescription - 09/03/23 1000       Date of Initial Exercise RX and Referring Provider   Date 09/03/23    Referring Provider Marca Ancona MD      Oxygen   Oxygen Continuous    Liters 4    Maintain Oxygen Saturation 88% or higher      Treadmill   MPH 2.2    Grade 0.5    Minutes 15    METs 2.84      REL-XR   Level 3    Speed 50    Minutes 15    METs 2.3      Prescription Details   Frequency (times per week) 3    Duration Progress to 30 minutes of continuous aerobic without signs/symptoms of physical distress      Intensity   THRR 40-80% of Max Heartrate 93-126    Ratings of Perceived Exertion 11-13    Perceived Dyspnea 0-4      Progression   Progression Continue to progress workloads to maintain intensity without signs/symptoms of physical distress.      Resistance Training   Training Prescription Yes    Weight 5 lb    Reps 10-15             Exercise Goals: Frequency: Be able to perform aerobic exercise two to three times per week in program working toward 2-5 days per week of home exercise.  Intensity: Work with a perceived exertion of 11 (fairly light) - 15 (hard) while following your exercise prescription.  We will make changes to your prescription with you as you progress through the program.   Duration: Be able to do 30 to 45 minutes of continuous aerobic exercise in addition to a 5 minute warm-up and a 5 minute cool-down routine.   Nutrition Goals: Your personal nutrition goals will be established when you do your  nutrition analysis with the dietician.  The following are general nutrition guidelines to follow: Cholesterol < 200mg /day Sodium < 1500mg /day Fiber: Men over 50 yrs - 30 grams per day  Personal Goals:  Personal Goals and Risk Factors at Admission - 09/03/23 0852       Core Components/Risk Factors/Patient Goals on Admission    Weight Management Yes;Obesity;Weight Loss    Intervention Weight Management: Develop a combined nutrition and exercise program designed to reach desired caloric intake, while maintaining appropriate intake of nutrient and fiber, sodium and fats, and appropriate energy expenditure required for the weight goal.;Weight Management: Provide education and appropriate resources to help participant work on and attain dietary goals.;Weight Management/Obesity: Establish reasonable short term and long term weight goals.;Obesity: Provide education and appropriate resources to help participant work on and attain dietary goals.    Admit Weight 219 lb 14.4 oz (99.7 kg)    Goal Weight: Short Term 215 lb (97.5 kg)    Goal Weight: Long Term 199 lb (90.3 kg)    Expected Outcomes Short Term: Continue to assess and modify interventions until short term weight is achieved;Long Term: Adherence to nutrition and physical activity/exercise program aimed toward attainment of established  weight goal;Weight Loss: Understanding of general recommendations for a balanced deficit meal plan, which promotes 1-2 lb weight loss per week and includes a negative energy balance of [phone] kcal/d;Understanding recommendations for meals to include 15-35% energy as protein, 25-35% energy from fat, 35-60% energy from carbohydrates, less than 200mg  of dietary cholesterol, 20-35 gm of total fiber daily;Understanding of distribution of calorie intake throughout the day with the consumption of 4-5 meals/snacks    Improve shortness of breath with ADL's Yes    Intervention Provide education, individualized exercise plan and  daily activity instruction to help decrease symptoms of SOB with activities of daily living.    Expected Outcomes Short Term: Improve cardiorespiratory fitness to achieve a reduction of symptoms when performing ADLs;Long Term: Be able to perform more ADLs without symptoms or delay the onset of symptoms    Increase knowledge of respiratory medications and ability to use respiratory devices properly  Yes    Intervention Provide education and demonstration as needed of appropriate use of medications, inhalers, and oxygen therapy.    Expected Outcomes Short Term: Achieves understanding of medications use. Understands that oxygen is a medication prescribed by physician. Demonstrates appropriate use of inhaler and oxygen therapy.;Long Term: Maintain appropriate use of medications, inhalers, and oxygen therapy.    Diabetes Yes    Intervention Provide education about signs/symptoms and action to take for hypo/hyperglycemia.;Provide education about proper nutrition, including hydration, and aerobic/resistive exercise prescription along with prescribed medications to achieve blood glucose in normal ranges: Fasting glucose 65-99 mg/dL    Expected Outcomes Short Term: Participant verbalizes understanding of the signs/symptoms and immediate care of hyper/hypoglycemia, proper foot care and importance of medication, aerobic/resistive exercise and nutrition plan for blood glucose control.;Long Term: Attainment of HbA1C < 7%.    Heart Failure Yes    Intervention Provide a combined exercise and nutrition program that is supplemented with education, support and counseling about heart failure. Directed toward relieving symptoms such as shortness of breath, decreased exercise tolerance, and extremity edema.    Expected Outcomes Improve functional capacity of life;Short term: Attendance in program 2-3 days a week with increased exercise capacity. Reported lower sodium intake. Reported increased fruit and vegetable intake.  Reports medication compliance.;Short term: Daily weights obtained and reported for increase. Utilizing diuretic protocols set by physician.;Long term: Adoption of self-care skills and reduction of barriers for early signs and symptoms recognition and intervention leading to self-care maintenance.    Hypertension Yes    Intervention Provide education on lifestyle modifcations including regular physical activity/exercise, weight management, moderate sodium restriction and increased consumption of fresh fruit, vegetables, and low fat dairy, alcohol moderation, and smoking cessation.;Monitor prescription use compliance.    Expected Outcomes Short Term: Continued assessment and intervention until BP is < 140/34mm HG in hypertensive participants. < 130/44mm HG in hypertensive participants with diabetes, heart failure or chronic kidney disease.;Long Term: Maintenance of blood pressure at goal levels.    Lipids Yes    Intervention Provide education and support for participant on nutrition & aerobic/resistive exercise along with prescribed medications to achieve LDL 70mg , HDL >40mg .    Expected Outcomes Short Term: Participant states understanding of desired cholesterol values and is compliant with medications prescribed. Participant is following exercise prescription and nutrition guidelines.;Long Term: Cholesterol controlled with medications as prescribed, with individualized exercise RX and with personalized nutrition plan. Value goals: LDL < 70mg , HDL > 40 mg.             Tobacco Use Initial Evaluation: Social History  Tobacco Use  Smoking Status Former   Current packs/day: 0.00   Average packs/day: 3.0 packs/day for 26.0 years (77.9 ttl pk-yrs)   Types: Cigarettes   Start date: 02/20/1958   Quit date: 02/12/1984   Years since quitting: 39.5  Smokeless Tobacco Former   Types: Chew  Tobacco Comments   Former smoker 08/30/2021    Exercise Goals and Review:  Exercise Goals     Row Name  09/03/23 1031             Exercise Goals   Increase Physical Activity Yes       Intervention Provide advice, education, support and counseling about physical activity/exercise needs.;Develop an individualized exercise prescription for aerobic and resistive training based on initial evaluation findings, risk stratification, comorbidities and participant's personal goals.       Expected Outcomes Short Term: Attend rehab on a regular basis to increase amount of physical activity.;Long Term: Add in home exercise to make exercise part of routine and to increase amount of physical activity.;Long Term: Exercising regularly at least 3-5 days a week.       Increase Strength and Stamina Yes       Intervention Provide advice, education, support and counseling about physical activity/exercise needs.;Develop an individualized exercise prescription for aerobic and resistive training based on initial evaluation findings, risk stratification, comorbidities and participant's personal goals.       Expected Outcomes Short Term: Increase workloads from initial exercise prescription for resistance, speed, and METs.;Short Term: Perform resistance training exercises routinely during rehab and add in resistance training at home;Long Term: Improve cardiorespiratory fitness, muscular endurance and strength as measured by increased METs and functional capacity ( )       Able to understand and use rate of perceived exertion (RPE) scale Yes       Intervention Provide education and explanation on how to use RPE scale       Expected Outcomes Short Term: Able to use RPE daily in rehab to express subjective intensity level;Long Term:  Able to use RPE to guide intensity level when exercising independently       Able to understand and use Dyspnea scale Yes       Intervention Provide education and explanation on how to use Dyspnea scale       Expected Outcomes Short Term: Able to use Dyspnea scale daily in rehab to express  subjective sense of shortness of breath during exertion;Long Term: Able to use Dyspnea scale to guide intensity level when exercising independently       Knowledge and understanding of Target Heart Rate Range (THRR) Yes       Intervention Provide education and explanation of THRR including how the numbers were predicted and where they are located for reference       Expected Outcomes Short Term: Able to state/look up THRR;Long Term: Able to use THRR to govern intensity when exercising independently;Short Term: Able to use daily as guideline for intensity in rehab       Able to check pulse independently Yes       Intervention Provide education and demonstration on how to check pulse in carotid and radial arteries.;Review the importance of being able to check your own pulse for safety during independent exercise       Expected Outcomes Short Term: Able to explain why pulse checking is important during independent exercise;Long Term: Able to check pulse independently and accurately       Understanding of Exercise Prescription Yes  Intervention Provide education, explanation, and written materials on patient's individual exercise prescription       Expected Outcomes Long Term: Able to explain home exercise prescription to exercise independently;Short Term: Able to explain program exercise prescription              Copy of goals given to participant.

## 2023-09-04 ENCOUNTER — Encounter (HOSPITAL_COMMUNITY): Payer: Medicare Other | Admitting: Cardiology

## 2023-09-04 ENCOUNTER — Encounter (HOSPITAL_COMMUNITY)
Admission: RE | Admit: 2023-09-04 | Discharge: 2023-09-04 | Disposition: A | Discharge status: Home / Self Care | Payer: Medicare Other | Source: Ambulatory Visit | Attending: Cardiology

## 2023-09-04 DIAGNOSIS — I272 Pulmonary hypertension, unspecified: Secondary | ICD-10-CM | POA: Diagnosis not present

## 2023-09-04 LAB — GLUCOSE, CAPILLARY
Glucose-Capillary: 151 mg/dL — ABNORMAL HIGH (ref 70–99)
Glucose-Capillary: 130 mg/dL — ABNORMAL HIGH (ref 70–99)

## 2023-09-04 NOTE — Progress Notes (Signed)
Daily Session Note  Patient Details  Name: Clarence Dawson MRN: [id number] Date of Birth: 07-Nov-1946 Referring Provider:   Flowsheet Row PULMONARY REHAB OTHER RESP ORIENTATION from 09/03/2023 in [facility]  Referring Provider Marca Ancona MD       Encounter Date: 09/04/2023  Check In:  Session Check In - 09/04/23 0745       Check-In   Supervising physician immediately available to respond to emergencies See telemetry face sheet for immediately available MD    Location [facility]    Staff Present Emiah Pellicano Daphine Deutscher, RN, BSN;Jessica Hawkins, MA, RCEP, CCRP, CCET    Virtual Visit No    Medication changes reported     No    Fall or balance concerns reported    No    Tobacco Cessation No Change    Warm-up and Cool-down Performed on first and last piece of equipment    Resistance Training Performed Yes    VAD Patient? No      Pain Assessment   Currently in Pain? No/denies             Capillary Blood Glucose: Results for orders placed or performed during the hospital encounter of 09/03/23 (from the past 24 hour(s))  Glucose, capillary     Status: Abnormal   Collection Time: 09/04/23  7:46 AM  Result Value Ref Range   Glucose-Capillary 151 (H) 70 - 99 mg/dL      Social History   Tobacco Use  Smoking Status Former   Current packs/day: 0.00   Average packs/day: 3.0 packs/day for 26.0 years (77.9 ttl pk-yrs)   Types: Cigarettes   Start date: 02/20/1958   Quit date: 02/12/1984   Years since quitting: 39.5  Smokeless Tobacco Former   Types: Chew  Tobacco Comments   Former smoker 08/30/2021    Goals Met:  Proper associated with RPD/PD & O2 Sat Independence with exercise equipment Using PLB without cueing & demonstrates good technique Exercise tolerated well Queuing for purse lip breathing No report of concerns or symptoms today Strength training completed today  Goals Unmet:  Not Applicable  Comments: First full day of  exercise!  Patient was oriented to gym and equipment including functions, settings, policies, and procedures.  Patient's individual exercise prescription and treatment plan were reviewed.  All starting workloads were established based on the results of the 6 minute walk test done at initial orientation visit.  The plan for exercise progression was also introduced and progression will be customized based on patient's performance and goals.    Dr. Erick Blinks is Medical Director for [facility].

## 2023-09-07 ENCOUNTER — Encounter (HOSPITAL_COMMUNITY)
Admission: RE | Admit: 2023-09-07 | Discharge: 2023-09-07 | Disposition: A | Discharge status: Home / Self Care | Payer: Medicare Other | Source: Ambulatory Visit | Attending: Cardiology | Admitting: Cardiology

## 2023-09-07 DIAGNOSIS — I272 Pulmonary hypertension, unspecified: Secondary | ICD-10-CM

## 2023-09-07 LAB — GLUCOSE, CAPILLARY
Glucose-Capillary: 119 mg/dL — ABNORMAL HIGH (ref 70–99)
Glucose-Capillary: 106 mg/dL — ABNORMAL HIGH (ref 70–99)

## 2023-09-07 NOTE — Progress Notes (Signed)
Daily Session Note  Patient Details  Name: Clarence Dawson MRN: [id number] Date of Birth: 1946/08/25 Referring Provider:   Flowsheet Row PULMONARY REHAB OTHER RESP ORIENTATION from 09/03/2023 in [facility] CARDIAC REHABILITATION  Referring Provider Marca Ancona MD       Encounter Date: 09/07/2023  Check In:  Session Check In - 09/07/23 0745       Check-In   Supervising physician immediately available to respond to emergencies See telemetry face sheet for immediately available MD    Location [facility]    Staff Present Ross Ludwig, BS, Exercise Physiologist;Xachary Hambly, RN;Jessica [place], MA, RCEP, CCRP, Dow Adolph, RN, BSN    Virtual Visit No    Medication changes reported     No    Fall or balance concerns reported    No    Tobacco Cessation No Change    Warm-up and Cool-down Performed on first and last piece of equipment    Resistance Training Performed Yes    VAD Patient? No    PAD/SET Patient? No      Pain Assessment   Currently in Pain? No/denies    Pain Score 0-No pain    Multiple Pain Sites No             Capillary Blood Glucose: No results found for this or any previous visit (from the past 24 hour(s)).    Social History   Tobacco Use  Smoking Status Former   Current packs/day: 0.00   Average packs/day: 3.0 packs/day for 26.0 years (77.9 ttl pk-yrs)   Types: Cigarettes   Start date: 02/20/1958   Quit date: 02/12/1984   Years since quitting: 39.5  Smokeless Tobacco Former   Types: Chew  Tobacco Comments   Former smoker 08/30/2021    Goals Met:  Proper associated with RPD/PD & O2 Sat Independence with exercise equipment Using PLB without cueing & demonstrates good technique Exercise tolerated well No report of concerns or symptoms today  Goals Unmet:  Not Applicable  Comments: Pt able to follow exercise prescription today without complaint.  Will continue to monitor for progression.    Dr. Erick Blinks is Medical Director for [facility].

## 2023-09-09 ENCOUNTER — Encounter (HOSPITAL_COMMUNITY)
Admission: RE | Admit: 2023-09-09 | Discharge: 2023-09-09 | Disposition: A | Discharge status: Home / Self Care | Payer: Medicare Other | Source: Ambulatory Visit | Attending: Cardiology | Admitting: Cardiology

## 2023-09-09 ENCOUNTER — Encounter (HOSPITAL_COMMUNITY): Payer: Self-pay | Admitting: *Deleted

## 2023-09-09 DIAGNOSIS — I272 Pulmonary hypertension, unspecified: Secondary | ICD-10-CM | POA: Diagnosis not present

## 2023-09-09 LAB — GLUCOSE, CAPILLARY
Glucose-Capillary: 207 mg/dL — ABNORMAL HIGH (ref 70–99)
Glucose-Capillary: 139 mg/dL — ABNORMAL HIGH (ref 70–99)

## 2023-09-09 NOTE — Progress Notes (Signed)
Pulmonary Individual Treatment Plan  Patient Details  Name: Clarence Dawson MRN: [id number] Date of Birth: 11-23-1946 Referring Provider:   Flowsheet Row PULMONARY REHAB OTHER RESP ORIENTATION from 09/03/2023 in [facility] CARDIAC REHABILITATION  Referring Provider Marca Ancona MD       Initial Encounter Date:  Flowsheet Row PULMONARY REHAB OTHER RESP ORIENTATION from 09/03/2023 in [place] CARDIAC REHABILITATION  Date 09/03/23       Visit Diagnosis: Pulmonary hypertension (HCC)  Patient's Home Medications on Admission:   Current Outpatient Medications:    acetaminophen (TYLENOL) 500 MG tablet, Take 1,000 mg by mouth 3 (three) times daily., Disp: , Rfl:    albuterol (PROVENTIL) (2.5 MG/3ML) 0.083% nebulizer solution, TAKE 1 VIAL VIA NEBULIZATION EVERY 6 HOURS AS NEEDED FOR WHEEZING OR SHORTNESS OF BREATH., Disp: 90 mL, Rfl: 2   albuterol (VENTOLIN HFA) 108 (90 Base) MCG/ACT inhaler, Inhale 2 puffs into the lungs every 4 (four) hours as needed for shortness of breath (only if you can't catch your breath/ asthma)., Disp: 18 g, Rfl: 5   atorvastatin (LIPITOR) 20 MG tablet, Take 20 mg by mouth at bedtime., Disp: , Rfl:    bisoprolol (ZEBETA) 5 MG tablet, TAKE 1 AND 1/2 TABLET BY MOUTH TWICE DAILY., Disp: 90 tablet, Rfl: 3   Budeson-Glycopyrrol-Formoterol (BREZTRI AEROSPHERE) 160-9-4.8 MCG/ACT AERO, Inhale 2 puffs into the lungs in the morning and at bedtime., Disp: 10.7 g, Rfl: 5   Carboxymethylcellul-Glycerin (LUBRICATING EYE DROPS OP), Place 1 drop into the right eye 2 (two) times a week. Clear eyes, Disp: , Rfl:    diltiazem (CARTIA XT) 120 MG 24 hr capsule, TAKE (1) CAPSULE BY MOUTH DAILY, MAY TAKE A EXTRA CAPSULE DAILY FOR BREAKTHROUGH AFIB., Disp: 180 capsule, Rfl: 3   dofetilide (TIKOSYN) 500 MCG capsule, Take 1 capsule (500 mcg total) by mouth 2 (two) times daily., Disp: 180 capsule, Rfl: 3   Dupilumab (DUPIXENT) 300 MG/2ML SOPN, Inject 300 mg into the skin every 14 (fourteen)  days., Disp: 12 mL, Rfl: 1   EPINEPHrine 0.3 mg/0.3 mL IJ SOAJ injection, Inject 0.3 mg into the muscle as needed for anaphylaxis., Disp: 1 each, Rfl: 5   furosemide (LASIX) 40 MG tablet, Take 1 tablet (40 mg total) by mouth in the morning AND 0.5 tablets (20 mg total) every evening., Disp: 90 tablet, Rfl: 3   glipiZIDE (GLUCOTROL XL) 5 MG 24 hr tablet, Take 5 mg by mouth daily before breakfast., Disp: , Rfl:    hydrocortisone cream 0.5 %, Apply 1 Application topically daily as needed for itching., Disp: , Rfl:    JARDIANCE 25 MG TABS tablet, Take 25 mg by mouth daily before breakfast., Disp: , Rfl:    levothyroxine (SYNTHROID) 150 MCG tablet, Take 150 mcg by mouth daily before breakfast., Disp: , Rfl:    losartan (COZAAR) 50 MG tablet, Take 50 mg by mouth at bedtime., Disp: , Rfl:    metFORMIN (GLUCOPHAGE-XR) 500 MG 24 hr tablet, Take 500 mg by mouth in the morning and at bedtime., Disp: , Rfl:    Multiple Vitamin (MULTIVITAMIN) tablet, Take 1 tablet by mouth daily., Disp: , Rfl:    omeprazole (PRILOSEC OTC) 20 MG tablet, Take 20 mg by mouth every morning., Disp: , Rfl:    PRESCRIPTION MEDICATION, Thumper vest, Disp: , Rfl:    Respiratory Therapy Supplies (FLUTTER) DEVI, Use as directed, Disp: 1 each, Rfl: 0   rivaroxaban (XARELTO) 20 MG TABS tablet, Take 20 mg by mouth at bedtime., Disp: ,  Rfl:    sodium chloride (OCEAN) 0.65 % SOLN nasal spray, Place 1 spray into both nostrils as needed for congestion., Disp: , Rfl:   Past Medical History: Past Medical History:  Diagnosis Date   Allergic rhinitis    Aortic valve disorder    Asthma    since childhood- seasonal allergies induced   Cancer (HCC)    Skin cancer- squamous, basal   Carotid artery stenosis    Essential hypertension    Full dentures    GERD (gastroesophageal reflux disease)    H/O hiatal hernia    Hemorrhage of rectum    Hyperlipidemia    Hypothyroidism    Male circumcision    OSA (obstructive sleep apnea)     Osteoarthritis    Pacemaker    Oct 2005 in [place].   PAF (paroxysmal atrial fibrillation) (HCC)    Pneumonia    "several Times" 2015 last time   Primary localized osteoarthritis of right knee 08/11/2017   RBBB (right bundle branch block)    Sinoatrial node dysfunction (HCC)    Syncope    Tricuspid valve disorder    Type 2 diabetes mellitus (HCC)    Type II    Tobacco Use: Social History   Tobacco Use  Smoking Status Former   Current packs/day: 0.00   Average packs/day: 3.0 packs/day for 26.0 years (77.9 ttl pk-yrs)   Types: Cigarettes   Start date: 02/20/1958   Quit date: 02/12/1984   Years since quitting: 39.6  Smokeless Tobacco Former   Types: Chew  Tobacco Comments   Former smoker 08/30/2021    Labs: Review Flowsheet  More data exists      Latest Ref Rng & Units 10/20/2016 08/13/2017 09/18/2019 08/28/2023 09/01/2023  Labs for ITP Cardiac and Pulmonary Rehab  Cholestrol 0 - 200 mg/dL - - - - 308   LDL (calc) 0 - 99 mg/dL - - - - 73   HDL-C >65 mg/dL - - - - 34   Trlycerides <150 mg/dL - - - - 784   Hemoglobin A1c 4.8 - 5.6 % 8.1  7.2  7.2  - -  PH, Arterial 7.35 - 7.45 - - - 7.438  -  PCO2 arterial 32 - 48 mmHg - - - 32.7  -  Bicarbonate 20.0 - 28.0 mmol/L - - - 22.1  25.0  24.7  -  TCO2 22 - 32 mmol/L - - - 23  26  26   -  Acid-base deficit 0.0 - 2.0 mmol/L - - - 1.0  -  O2 Saturation % - - - 95  65  64  -    Details       Multiple values from one day are sorted in reverse-chronological order         Capillary Blood Glucose: Lab Results  Component Value Date   GLUCAP 106 (H) 09/07/2023   GLUCAP 119 (H) 09/07/2023   GLUCAP 130 (H) 09/04/2023   GLUCAP 151 (H) 09/04/2023   GLUCAP 158 (H) 08/28/2023     Pulmonary Assessment Scores:  Pulmonary Assessment Scores     Row Name 09/04/23 0821         ADL UCSD   ADL Phase Entry     SOB Score total 47     Rest 1     Walk 3     Stairs 3     Bath 1     Dress 2     Shop 2  CAT Score   CAT Score  16       mMRC Score   mMRC Score 1             UCSD: Self-administered rating of dyspnea associated with activities of daily living (ADLs) 6-point scale (0 = "not at all" to 5 = "maximal or unable to do because of breathlessness")  Scoring Scores range from 0 to 120.  Minimally important difference is 5 units  CAT: CAT can identify the health impairment of COPD patients and is better correlated with disease progression.  CAT has a scoring range of zero to 40. The CAT score is classified into four groups of low (less than 10), medium (10 - 20), high (21-30) and very high (31-40) based on the impact level of disease on health status. A CAT score over 10 suggests significant symptoms.  A worsening CAT score could be explained by an exacerbation, poor medication adherence, poor inhaler technique, or progression of COPD or comorbid conditions.  CAT MCID is 2 points  mMRC: mMRC (Modified Medical Research Council) Dyspnea Scale is used to assess the degree of baseline functional disability in patients of respiratory disease due to dyspnea. No minimal important difference is established. A decrease in score of 1 point or greater is considered a positive change.   Pulmonary Function Assessment:   Exercise Target Goals: Exercise Program Goal: Individual exercise prescription set using results from initial 6 min walk test and THRR while considering  patient's activity barriers and safety.   Exercise Prescription Goal: Initial exercise prescription builds to 30-45 minutes a day of aerobic activity, 2-3 days per week.  Home exercise guidelines will be given to patient during program as part of exercise prescription that the participant will acknowledge.  Activity Barriers & Risk Stratification:  Activity Barriers & Cardiac Risk Stratification - 09/03/23 0844       Activity Barriers & Cardiac Risk Stratification   Activity Barriers Deconditioning;Muscular Weakness;Shortness of Breath;Other  (comment);Arthritis;Left Knee Replacement;Right Knee Replacement;Joint Problems;Balance Concerns;History of Falls;Decreased Ventricular Function    Comments grip effected by arthritis, bilateral knee arth, and shoulders             6 Minute Walk:  6 Minute Walk     Row Name 09/03/23 1012         6 Minute Walk   Phase Initial     Distance 1120 feet     Walk Time 5.17 minutes     # of Rest Breaks 1  50 sec     MPH 2.46     METS 1.69     RPE 14     Perceived Dyspnea  2     VO2 Peak 5.91     Symptoms Yes (comment)     Comments SOB, calves painful 8/10     Resting HR 60 bpm     Resting BP 124/72     Resting Oxygen Saturation  92 %     Exercise Oxygen Saturation  during 6 min walk 87 %     Max Ex. HR 64 bpm     Max Ex. BP 146/64     2 Minute Post BP 132/70       Interval HR   1 Minute HR 60     2 Minute HR 61     3 Minute HR 62     4 Minute HR 64     5 Minute HR 60     6 Minute HR 61  2 Minute Post HR 60     Interval Heart Rate? Yes       Interval Oxygen   Interval Oxygen? Yes     Baseline Oxygen Saturation % 92 %     1 Minute Oxygen Saturation % 87 %     1 Minute Liters of Oxygen 4 L  pulsed     2 Minute Oxygen Saturation % 87 %     2 Minute Liters of Oxygen 4 L     3 Minute Oxygen Saturation % 88 %     3 Minute Liters of Oxygen 4 L     4 Minute Oxygen Saturation % 89 %     4 Minute Liters of Oxygen 4 L     5 Minute Oxygen Saturation % 87 %     5 Minute Liters of Oxygen 4 L     6 Minute Oxygen Saturation % 88 %     6 Minute Liters of Oxygen 4 L     2 Minute Post Oxygen Saturation % 90 %     2 Minute Post Liters of Oxygen 4 L              Oxygen Initial Assessment:  Oxygen Initial Assessment - 09/03/23 0849       Home Oxygen   Home Oxygen Device Portable Concentrator;Home Concentrator    Sleep Oxygen Prescription BiPAP;Continuous    Liters per minute 4    Home Exercise Oxygen Prescription Continuous    Liters per minute 4    Home Resting  Oxygen Prescription Continuous    Liters per minute 3    Compliance with Home Oxygen Use Yes      Initial 6 min Walk   Oxygen Used Pulsed;Portable Concentrator    Liters per minute 4      Program Oxygen Prescription   Program Oxygen Prescription Continuous;E-Tanks    Liters per minute 3      Intervention   Short Term Goals To learn and exhibit compliance with exercise, home and travel O2 prescription;To learn and understand importance of monitoring SPO2 with pulse oximeter and demonstrate accurate use of the pulse oximeter.;To learn and understand importance of maintaining oxygen saturations>88%;To learn and demonstrate proper pursed lip breathing techniques or other breathing techniques. ;To learn and demonstrate proper use of respiratory medications    Long  Term Goals Exhibits compliance with exercise, home  and travel O2 prescription;Maintenance of O2 saturations>88%;Compliance with respiratory medication;Verbalizes importance of monitoring SPO2 with pulse oximeter and return demonstration;Exhibits proper breathing techniques, such as pursed lip breathing or other method taught during program session;Demonstrates proper use of MDI's             Oxygen Re-Evaluation:  Oxygen Re-Evaluation     Row Name 09/03/23 1049             Goals/Expected Outcomes   Short Term Goals To learn and demonstrate proper pursed lip breathing techniques or other breathing techniques.        Long  Term Goals Exhibits proper breathing techniques, such as pursed lip breathing or other method taught during program session       Comments Reviewed PLB technique with pt.  Talked about how it works and it's importance in maintaining their exercise saturations.       Goals/Expected Outcomes Short: Become more profiecient at using PLB.   Long: Become independent at using PLB.  Oxygen Discharge (Final Oxygen Re-Evaluation):  Oxygen Re-Evaluation - 09/03/23 1049       Goals/Expected  Outcomes   Short Term Goals To learn and demonstrate proper pursed lip breathing techniques or other breathing techniques.     Long  Term Goals Exhibits proper breathing techniques, such as pursed lip breathing or other method taught during program session    Comments Reviewed PLB technique with pt.  Talked about how it works and it's importance in maintaining their exercise saturations.    Goals/Expected Outcomes Short: Become more profiecient at using PLB.   Long: Become independent at using PLB.             Initial Exercise Prescription:  Initial Exercise Prescription - 09/03/23 1000       Date of Initial Exercise RX and Referring Provider   Date 09/03/23    Referring Provider Marca Ancona MD      Oxygen   Oxygen Continuous    Liters 4    Maintain Oxygen Saturation 88% or higher      Treadmill   MPH 2.2    Grade 0.5    Minutes 15    METs 2.84      REL-XR   Level 3    Speed 50    Minutes 15    METs 2.3      Prescription Details   Frequency (times per week) 3    Duration Progress to 30 minutes of continuous aerobic without signs/symptoms of physical distress      Intensity   THRR 40-80% of Max Heartrate 93-126    Ratings of Perceived Exertion 11-13    Perceived Dyspnea 0-4      Progression   Progression Continue to progress workloads to maintain intensity without signs/symptoms of physical distress.      Resistance Training   Training Prescription Yes    Weight 5 lb    Reps 10-15             Perform Capillary Blood Glucose checks as needed.  Exercise Prescription Changes:   Exercise Prescription Changes     Row Name 09/03/23 1000             Response to Exercise   Blood Pressure (Admit) 124/72       Blood Pressure (Exercise) 146/64       Blood Pressure (Exit) 120/62       Heart Rate (Admit) 60 bpm       Heart Rate (Exercise) 64 bpm       Heart Rate (Exit) 60 bpm       Oxygen Saturation (Admit) 92 %       Oxygen Saturation (Exercise)  87 %       Oxygen Saturation (Exit) 90 %       Rating of Perceived Exertion (Exercise) 14       Perceived Dyspnea (Exercise) 2       Symptoms SOB, calves pain 8/10       Comments walk test results                Exercise Comments:   Exercise Goals and Review:   Exercise Goals     Row Name 09/03/23 1031             Exercise Goals   Increase Physical Activity Yes       Intervention Provide advice, education, support and counseling about physical activity/exercise needs.;Develop an individualized exercise prescription for aerobic and resistive training based on initial  evaluation findings, risk stratification, comorbidities and participant's personal goals.       Expected Outcomes Short Term: Attend rehab on a regular basis to increase amount of physical activity.;Long Term: Add in home exercise to make exercise part of routine and to increase amount of physical activity.;Long Term: Exercising regularly at least 3-5 days a week.       Increase Strength and Stamina Yes       Intervention Provide advice, education, support and counseling about physical activity/exercise needs.;Develop an individualized exercise prescription for aerobic and resistive training based on initial evaluation findings, risk stratification, comorbidities and participant's personal goals.       Expected Outcomes Short Term: Increase workloads from initial exercise prescription for resistance, speed, and METs.;Short Term: Perform resistance training exercises routinely during rehab and add in resistance training at home;Long Term: Improve cardiorespiratory fitness, muscular endurance and strength as measured by increased METs and functional capacity ( )       Able to understand and use rate of perceived exertion (RPE) scale Yes       Intervention Provide education and explanation on how to use RPE scale       Expected Outcomes Short Term: Able to use RPE daily in rehab to express subjective intensity  level;Long Term:  Able to use RPE to guide intensity level when exercising independently       Able to understand and use Dyspnea scale Yes       Intervention Provide education and explanation on how to use Dyspnea scale       Expected Outcomes Short Term: Able to use Dyspnea scale daily in rehab to express subjective sense of shortness of breath during exertion;Long Term: Able to use Dyspnea scale to guide intensity level when exercising independently       Knowledge and understanding of Target Heart Rate Range (THRR) Yes       Intervention Provide education and explanation of THRR including how the numbers were predicted and where they are located for reference       Expected Outcomes Short Term: Able to state/look up THRR;Long Term: Able to use THRR to govern intensity when exercising independently;Short Term: Able to use daily as guideline for intensity in rehab       Able to check pulse independently Yes       Intervention Provide education and demonstration on how to check pulse in carotid and radial arteries.;Review the importance of being able to check your own pulse for safety during independent exercise       Expected Outcomes Short Term: Able to explain why pulse checking is important during independent exercise;Long Term: Able to check pulse independently and accurately       Understanding of Exercise Prescription Yes       Intervention Provide education, explanation, and written materials on patient's individual exercise prescription       Expected Outcomes Long Term: Able to explain home exercise prescription to exercise independently;Short Term: Able to explain program exercise prescription                Exercise Goals Re-Evaluation :  Exercise Goals Re-Evaluation     Row Name 09/03/23 1032             Exercise Goal Re-Evaluation   Exercise Goals Review Able to understand and use rate of perceived exertion (RPE) scale;Able to understand and use Dyspnea  scale;Understanding of Exercise Prescription       Comments Reviewed RPE  and dyspnea scale and  program prescription with pt today.  Pt voiced understanding and was given a copy of goals to take home.       Expected Outcomes Short: Use RPE daily to regulate intensity.  Long: Follow program prescription                Discharge Exercise Prescription (Final Exercise Prescription Changes):  Exercise Prescription Changes - 09/03/23 1000       Response to Exercise   Blood Pressure (Admit) 124/72    Blood Pressure (Exercise) 146/64    Blood Pressure (Exit) 120/62    Heart Rate (Admit) 60 bpm    Heart Rate (Exercise) 64 bpm    Heart Rate (Exit) 60 bpm    Oxygen Saturation (Admit) 92 %    Oxygen Saturation (Exercise) 87 %    Oxygen Saturation (Exit) 90 %    Rating of Perceived Exertion (Exercise) 14    Perceived Dyspnea (Exercise) 2    Symptoms SOB, calves pain 8/10    Comments walk test results             Nutrition:  Target Goals: Understanding of nutrition guidelines, daily intake of sodium 1500mg , cholesterol 200mg , calories 30% from fat and 7% or less from saturated fats, daily to have 5 or more servings of fruits and vegetables.  Biometrics:  Pre Biometrics - 09/03/23 1033       Pre Biometrics   Height 5' 9.5" (1.765 m)    Weight 219 lb 14.4 oz (99.7 kg)    Waist Circumference 44 inches    Hip Circumference 42.5 inches    Waist to Hip Ratio 1.04 %    BMI (Calculated) 32.02    Grip Strength 21.7 kg    Single Leg Stand 2.3 seconds              Nutrition Therapy Plan and Nutrition Goals:  Nutrition Therapy & Goals - 09/03/23 0853       Intervention Plan   Intervention Prescribe, educate and counsel regarding individualized specific dietary modifications aiming towards targeted core components such as weight, hypertension, lipid management, diabetes, heart failure and other comorbidities.    Expected Outcomes Short Term Goal: Understand basic principles of  dietary content, such as calories, fat, sodium, cholesterol and nutrients.;Long Term Goal: Adherence to prescribed nutrition plan.             Nutrition Assessments:  MEDIFICTS Score Key: >=70 Need to make dietary changes  40-70 Heart Healthy Diet <= 40 Therapeutic Level Cholesterol Diet  Flowsheet Row PULMONARY REHAB OTHER RESPIRATORY from 09/04/2023 in [facility] CARDIAC REHABILITATION  Picture Your Plate Total Score on Admission 58      Picture Your Plate Scores: <16 Unhealthy dietary pattern with much room for improvement. 41-50 Dietary pattern unlikely to meet recommendations for good health and room for improvement. 51-60 More healthful dietary pattern, with some room for improvement.  >60 Healthy dietary pattern, although there may be some specific behaviors that could be improved.    Nutrition Goals Re-Evaluation:   Nutrition Goals Discharge (Final Nutrition Goals Re-Evaluation):   Psychosocial: Target Goals: Acknowledge presence or absence of significant depression and/or stress, maximize coping skills, provide positive support system. Participant is able to verbalize types and ability to use techniques and skills needed for reducing stress and depression.  Initial Review & Psychosocial Screening:  Initial Psych Review & Screening - 09/03/23 0853       Initial Review   Current issues with Current Stress Concerns  Source of Stress Concerns Chronic Illness;Unable to perform yard/household activities;Unable to participate in former interests or hobbies;Family    Comments dealing with not being able to go and do like he wants to, loss two wives, last one in March, sister and husband living with them      Family Dynamics   Good Support System? Yes   daughter in law and grandkids live near by, sister and husband living with him currently     Barriers   Psychosocial barriers to participate in program The patient should benefit from training in stress management and  relaxation.;Psychosocial barriers identified (see note)      Screening Interventions   Interventions Encouraged to exercise;To provide support and resources with identified psychosocial needs;Provide feedback about the scores to participant    Expected Outcomes Short Term goal: Utilizing psychosocial counselor, staff and physician to assist with identification of specific Stressors or current issues interfering with healing process. Setting desired goal for each stressor or current issue identified.;Long Term Goal: Stressors or current issues are controlled or eliminated.;Short Term goal: Identification and review with participant of any Quality of Life or Depression concerns found by scoring the questionnaire.;Long Term goal: The participant improves quality of Life and PHQ9 Scores as seen by post scores and/or verbalization of changes             Quality of Life Scores:  Scores of 19 and below usually indicate a poorer quality of life in these areas.  A difference of  2-3 points is a clinically meaningful difference.  A difference of 2-3 points in the total score of the Quality of Life Index has been associated with significant improvement in overall quality of life, self-image, physical symptoms, and general health in studies assessing change in quality of life.   PHQ-9: Review Flowsheet       09/03/2023  Depression screen PHQ 2/9  Decreased Interest 0  Down, Depressed, Hopeless 0  PHQ - 2 Score 0  Altered sleeping 0  Tired, decreased energy 0  Change in appetite 0  Feeling bad or failure about yourself  0  Trouble concentrating 0  Moving slowly or fidgety/restless 0  Suicidal thoughts 0  PHQ-9 Score 0  Difficult doing work/chores Not difficult at all    Details           Interpretation of Total Score  Total Score Depression Severity:  1-4 = Minimal depression, 5-9 = Mild depression, 10-14 = Moderate depression, 15-19 = Moderately severe depression, 20-27 = Severe  depression   Psychosocial Evaluation and Intervention:  Psychosocial Evaluation - 09/03/23 0858       Psychosocial Evaluation & Interventions   Interventions Stress management education;Encouraged to exercise with the program and follow exercise prescription    Comments Clarence Dawson is coming into pulmonary rehab for pulmonary hypertension.  He is a happy and positive person and eager to get going in the program. He had a bad fall back in April over his oxygen tubing and broke three ribs and collapsed his lung.  Since his fall, he has had to rely on his oxygen more.  Previously, he could go a few hours around the house without it, but now he is on 3-4L constantly for any activity.  He has no history of anixety or depression but lost his 2nd wife to cancer in March of 2023 (1st wife to cancer in 2016).  He enjoys haning out with friends to play card and going to [employer] to read books.  He used to go to [facility] for exercise but the oxygen feels cumberson to him currently but he would like to get back there.  His sister and her husband are currently living with him after they lost their condo, but he enjoys their company and meals together.  He has been working on weight loss and has lost 20 lb since June.  He would like to get down under 200 lb.  He has a new concentrator for oxygen that seems to be working better for him and his breathing.  He wants to get back to whre he was before his fall in April.  He would also like to try to tritrate down on his oxygen needs for activity.  He also has a daughter in law that lives locally with two grandsons that help with yard work.  They also go out to eat at least once a week together.  He has only been doing some weights and walking in the house for acitivity and would like to get back to more regular exercise again.    Expected Outcomes Short: Attend rehab to build stamina and improve breathing Long: Continue to focus on positives and wean oxygen    Continue Psychosocial  Services  Follow up required by staff             Psychosocial Re-Evaluation:   Psychosocial Discharge (Final Psychosocial Re-Evaluation):    Education: Education Goals: Education classes will be provided on a weekly basis, covering required topics. Participant will state understanding/return demonstration of topics presented.  Learning Barriers/Preferences:  Learning Barriers/Preferences - 09/03/23 1040       Learning Barriers/Preferences   Learning Barriers Sight   contacts   Learning Preferences None             Education Topics: How Lungs Work and Diseases: - Discuss the anatomy of the lungs and diseases that can affect the lungs, such as COPD.   Exercise: -Discuss the importance of exercise, FITT principles of exercise, normal and abnormal responses to exercise, and how to exercise safely.   Environmental Irritants: -Discuss types of environmental irritants and how to limit exposure to environmental irritants.   Meds/Inhalers and oxygen: - Discuss respiratory medications, definition of an inhaler and oxygen, and the proper way to use an inhaler and oxygen.   Energy Saving Techniques: - Discuss methods to conserve energy and decrease shortness of breath when performing activities of daily living.    Bronchial Hygiene / Breathing Techniques: - Discuss breathing mechanics, pursed-lip breathing technique,  proper posture, effective ways to clear airways, and other functional breathing techniques   Cleaning Equipment: - Provides group verbal and written instruction about the health risks of elevated stress, cause of high stress, and healthy ways to reduce stress.   Nutrition I: Fats: - Discuss the types of cholesterol, what cholesterol does to the body, and how cholesterol levels can be controlled.   Nutrition II: Labels: -Discuss the different components of food labels and how to read food labels.   Respiratory Infections: - Discuss the signs and  symptoms of respiratory infections, ways to prevent respiratory infections, and the importance of seeking medical treatment when having a respiratory infection.   Stress I: Signs and Symptoms: - Discuss the causes of stress, how stress may lead to anxiety and depression, and ways to limit stress.   Stress II: Relaxation: -Discuss relaxation techniques to limit stress.   Oxygen for Home/Travel: - Discuss how to prepare for travel when on oxygen and proper  ways to transport and store oxygen to ensure safety.   Knowledge Questionnaire Score:  Knowledge Questionnaire Score - 09/04/23 1610       Knowledge Questionnaire Score   Pre Score 18/18             Core Components/Risk Factors/Patient Goals at Admission:  Personal Goals and Risk Factors at Admission - 09/03/23 0852       Core Components/Risk Factors/Patient Goals on Admission    Weight Management Yes;Obesity;Weight Loss    Intervention Weight Management: Develop a combined nutrition and exercise program designed to reach desired caloric intake, while maintaining appropriate intake of nutrient and fiber, sodium and fats, and appropriate energy expenditure required for the weight goal.;Weight Management: Provide education and appropriate resources to help participant work on and attain dietary goals.;Weight Management/Obesity: Establish reasonable short term and long term weight goals.;Obesity: Provide education and appropriate resources to help participant work on and attain dietary goals.    Admit Weight 219 lb 14.4 oz (99.7 kg)    Goal Weight: Short Term 215 lb (97.5 kg)    Goal Weight: Long Term 199 lb (90.3 kg)    Expected Outcomes Short Term: Continue to assess and modify interventions until short term weight is achieved;Long Term: Adherence to nutrition and physical activity/exercise program aimed toward attainment of established weight goal;Weight Loss: Understanding of general recommendations for a balanced deficit meal  plan, which promotes 1-2 lb weight loss per week and includes a negative energy balance of [phone] kcal/d;Understanding recommendations for meals to include 15-35% energy as protein, 25-35% energy from fat, 35-60% energy from carbohydrates, less than 200mg  of dietary cholesterol, 20-35 gm of total fiber daily;Understanding of distribution of calorie intake throughout the day with the consumption of 4-5 meals/snacks    Improve shortness of breath with ADL's Yes    Intervention Provide education, individualized exercise plan and daily activity instruction to help decrease symptoms of SOB with activities of daily living.    Expected Outcomes Short Term: Improve cardiorespiratory fitness to achieve a reduction of symptoms when performing ADLs;Long Term: Be able to perform more ADLs without symptoms or delay the onset of symptoms    Increase knowledge of respiratory medications and ability to use respiratory devices properly  Yes    Intervention Provide education and demonstration as needed of appropriate use of medications, inhalers, and oxygen therapy.    Expected Outcomes Short Term: Achieves understanding of medications use. Understands that oxygen is a medication prescribed by physician. Demonstrates appropriate use of inhaler and oxygen therapy.;Long Term: Maintain appropriate use of medications, inhalers, and oxygen therapy.    Diabetes Yes    Intervention Provide education about signs/symptoms and action to take for hypo/hyperglycemia.;Provide education about proper nutrition, including hydration, and aerobic/resistive exercise prescription along with prescribed medications to achieve blood glucose in normal ranges: Fasting glucose 65-99 mg/dL    Expected Outcomes Short Term: Participant verbalizes understanding of the signs/symptoms and immediate care of hyper/hypoglycemia, proper foot care and importance of medication, aerobic/resistive exercise and nutrition plan for blood glucose control.;Long Term:  Attainment of HbA1C < 7%.    Heart Failure Yes    Intervention Provide a combined exercise and nutrition program that is supplemented with education, support and counseling about heart failure. Directed toward relieving symptoms such as shortness of breath, decreased exercise tolerance, and extremity edema.    Expected Outcomes Improve functional capacity of life;Short term: Attendance in program 2-3 days a week with increased exercise capacity. Reported lower sodium intake. Reported increased fruit  and vegetable intake. Reports medication compliance.;Short term: Daily weights obtained and reported for increase. Utilizing diuretic protocols set by physician.;Long term: Adoption of self-care skills and reduction of barriers for early signs and symptoms recognition and intervention leading to self-care maintenance.    Hypertension Yes    Intervention Provide education on lifestyle modifcations including regular physical activity/exercise, weight management, moderate sodium restriction and increased consumption of fresh fruit, vegetables, and low fat dairy, alcohol moderation, and smoking cessation.;Monitor prescription use compliance.    Expected Outcomes Short Term: Continued assessment and intervention until BP is < 140/53mm HG in hypertensive participants. < 130/56mm HG in hypertensive participants with diabetes, heart failure or chronic kidney disease.;Long Term: Maintenance of blood pressure at goal levels.    Lipids Yes    Intervention Provide education and support for participant on nutrition & aerobic/resistive exercise along with prescribed medications to achieve LDL 70mg , HDL >40mg .    Expected Outcomes Short Term: Participant states understanding of desired cholesterol values and is compliant with medications prescribed. Participant is following exercise prescription and nutrition guidelines.;Long Term: Cholesterol controlled with medications as prescribed, with individualized exercise RX and with  personalized nutrition plan. Value goals: LDL < 70mg , HDL > 40 mg.             Core Components/Risk Factors/Patient Goals Review:    Core Components/Risk Factors/Patient Goals at Discharge (Final Review):    ITP Comments:  ITP Comments     Row Name 09/03/23 1011 09/04/23 0801 09/09/23 0750       ITP Comments Patient attend orientation today.  Patient is attendingPulmonary Rehabilitation Program.  Documentation for diagnosis can be found in [facility] OV 09/01/23.  Reviewed medical chart, RPE/RPD, gym safety, and program guidelines.  Patient was fitted to equipment they will be using during rehab.  Patient is scheduled to start exercise on 09/04/23 at 745.   Initial ITP created and sent for review and signature by Dr. Erick Blinks, Medical Director for Pulmonary Rehabilitation Program. First full day of exercise!  Patient was oriented to gym and equipment including functions, settings, policies, and procedures.  Patient's individual exercise prescription and treatment plan were reviewed.  All starting workloads were established based on the results of the 6 minute walk test done at initial orientation visit.  The plan for exercise progression was also introduced and progression will be customized based on patient's performance and goals. 30 day review completed. ITP sent to Dr.Jehanzeb Memon, Medical Director of  Pulmonary Rehab. Continue with ITP unless changes are made by physician.  New to program.              Comments: 30 day review

## 2023-09-09 NOTE — Progress Notes (Signed)
Daily Session Note  Patient Details  Name: Clarence Dawson MRN: [id number] Date of Birth: [birth year] Referring Provider:   Flowsheet Row PULMONARY REHAB OTHER RESP ORIENTATION from 09/03/2023 in [facility] CARDIAC REHABILITATION  Referring Provider Marca Ancona MD       Encounter Date: 09/09/2023  Check In:  Session Check In - 09/09/23 0745       Check-In   Supervising physician immediately available to respond to emergencies [facility] MD immediately available    Physician(s) Dr. Diona Browner    Location [facility]    Staff Present Avanell Shackleton BSN, RN;Heather Fredric Mare, BS, Exercise Physiologist;Daphyne Daphine Deutscher, RN, BSN    Virtual Visit No    Medication changes reported     No    Fall or balance concerns reported    No    Tobacco Cessation No Change    Warm-up and Cool-down Performed on first and last piece of equipment    Resistance Training Performed Yes    VAD Patient? No    PAD/SET Patient? No      Pain Assessment   Currently in Pain? No/denies    Pain Score 0-No pain    Multiple Pain Sites No             Capillary Blood Glucose: No results found for this or any previous visit (from the past 24 hour(s)).    Social History   Tobacco Use  Smoking Status Former   Current packs/day: 0.00   Average packs/day: 3.0 packs/day for 26.0 years (77.9 ttl pk-yrs)   Types: Cigarettes   Start date: 02/20/1958   Quit date: 02/12/1984   Years since quitting: 39.6  Smokeless Tobacco Former   Types: Chew  Tobacco Comments   Former smoker 08/30/2021    Goals Met:  Proper associated with RPD/PD & O2 Sat Independence with exercise equipment Using PLB without cueing & demonstrates good technique Exercise tolerated well Queuing for purse lip breathing No report of concerns or symptoms today Strength training completed today  Goals Unmet:  Not Applicable  Comments: .Pt able to follow exercise prescription today without complaint.  Will continue to monitor  for progression.    Dr. Erick Blinks is Medical Director for [facility] Pulmonary Rehab.

## 2023-09-10 ENCOUNTER — Other Ambulatory Visit (HOSPITAL_COMMUNITY): Payer: Self-pay

## 2023-09-11 ENCOUNTER — Encounter (HOSPITAL_COMMUNITY)
Admission: RE | Admit: 2023-09-11 | Discharge: 2023-09-11 | Disposition: A | Discharge status: Home / Self Care | Payer: Medicare Other | Source: Ambulatory Visit | Attending: Cardiology | Admitting: Cardiology

## 2023-09-11 DIAGNOSIS — I272 Pulmonary hypertension, unspecified: Secondary | ICD-10-CM | POA: Diagnosis not present

## 2023-09-11 NOTE — Progress Notes (Signed)
Daily Session Note  Patient Details  Name: JITENDER HANDEL MRN: [id number] Date of Birth: August 02, 1946 Referring Provider:   Flowsheet Row PULMONARY REHAB OTHER RESP ORIENTATION from 09/03/2023 in [facility] CARDIAC REHABILITATION  Referring Provider Marca Ancona MD       Encounter Date: 09/11/2023  Check In:  Session Check In - 09/11/23 0745       Check-In   Supervising physician immediately available to respond to emergencies See telemetry face sheet for immediately available MD    Location [facility]    Staff Present Ross Ludwig, BS, Exercise Physiologist;Shala Baumbach Daphine Deutscher, RN, BSN;Jessica Hawkins, MA, RCEP, CCRP, CCET    Virtual Visit No    Medication changes reported     No    Fall or balance concerns reported    No    Tobacco Cessation No Change    Warm-up and Cool-down Performed on first and last piece of equipment    Resistance Training Performed Yes    VAD Patient? No      Pain Assessment   Currently in Pain? No/denies             Capillary Blood Glucose: No results found for this or any previous visit (from the past 24 hour(s)).    Social History   Tobacco Use  Smoking Status Former   Current packs/day: 0.00   Average packs/day: 3.0 packs/day for 26.0 years (77.9 ttl pk-yrs)   Types: Cigarettes   Start date: 02/20/1958   Quit date: 02/12/1984   Years since quitting: 39.6  Smokeless Tobacco Former   Types: Chew  Tobacco Comments   Former smoker 08/30/2021    Goals Met:  Proper associated with RPD/PD & O2 Sat Independence with exercise equipment Using PLB without cueing & demonstrates good technique Exercise tolerated well Queuing for purse lip breathing No report of concerns or symptoms today Strength training completed today  Goals Unmet:  Not Applicable  Comments: Pt able to follow exercise prescription today without complaint.  Will continue to monitor for progression.    Dr. Dina Rich is Medical Director for  [facility] Cardiac Rehab

## 2023-09-11 NOTE — Progress Notes (Deleted)
Daily Session Note  Patient Details  Name: KHRIZ BAILIE MRN: [id number] Date of Birth: [birth year] Referring Provider:   Flowsheet Row PULMONARY REHAB OTHER RESP ORIENTATION from 09/03/2023 in [facility] CARDIAC REHABILITATION  Referring Provider Marca Ancona MD       Encounter Date: 09/11/2023  Check In:  Session Check In - 09/11/23 0745       Check-In   Supervising physician immediately available to respond to emergencies See telemetry face sheet for immediately available MD    Location [facility]    Staff Present Ross Ludwig, BS, Exercise Physiologist;Kevon Tench Daphine Deutscher, RN, BSN;Jessica Hawkins, MA, RCEP, CCRP, CCET    Virtual Visit No    Medication changes reported     No    Fall or balance concerns reported    No    Tobacco Cessation No Change    Warm-up and Cool-down Performed on first and last piece of equipment    Resistance Training Performed Yes    VAD Patient? No      Pain Assessment   Currently in Pain? No/denies             Capillary Blood Glucose: No results found for this or any previous visit (from the past 24 hour(s)).    Social History   Tobacco Use  Smoking Status Former   Current packs/day: 0.00   Average packs/day: 3.0 packs/day for 26.0 years (77.9 ttl pk-yrs)   Types: Cigarettes   Start date: 02/20/1958   Quit date: 02/12/1984   Years since quitting: 39.6  Smokeless Tobacco Former   Types: Chew  Tobacco Comments   Former smoker 08/30/2021    Goals Met:  Independence with exercise equipment Exercise tolerated well No report of concerns or symptoms today Strength training completed today  Goals Unmet:  Not Applicable  Comments: Pt able to follow exercise prescription today without complaint.  Will continue to monitor for progression.    Dr. Dina Rich is Medical Director for [facility] Cardiac Rehab

## 2023-09-14 ENCOUNTER — Ambulatory Visit: Payer: Medicare Other | Admitting: Pulmonary Disease

## 2023-09-14 ENCOUNTER — Encounter (HOSPITAL_COMMUNITY)
Admission: RE | Admit: 2023-09-14 | Discharge: 2023-09-14 | Disposition: A | Discharge status: Home / Self Care | Payer: Medicare Other | Source: Ambulatory Visit | Attending: Cardiology | Admitting: Cardiology

## 2023-09-14 DIAGNOSIS — I272 Pulmonary hypertension, unspecified: Secondary | ICD-10-CM

## 2023-09-14 NOTE — Progress Notes (Signed)
Daily Session Note  Patient Details  Name: BERTICE HELMICH MRN: [id number] Date of Birth: 1946/07/16 Referring Provider:   Flowsheet Row PULMONARY REHAB OTHER RESP ORIENTATION from 09/03/2023 in [facility] CARDIAC REHABILITATION  Referring Provider Marca Ancona MD       Encounter Date: 09/14/2023  Check In:  Session Check In - 09/14/23 0745       Check-In   Supervising physician immediately available to respond to emergencies See telemetry face sheet for immediately available MD    Location [facility]    Staff Present Ross Ludwig, BS, Exercise Physiologist;Jessica Juanetta Gosling, MA, RCEP, CCRP, CCET    Virtual Visit No    Medication changes reported     No    Fall or balance concerns reported    No    Tobacco Cessation No Change    Warm-up and Cool-down Performed on first and last piece of equipment    Resistance Training Performed Yes    VAD Patient? No    PAD/SET Patient? No      Pain Assessment   Currently in Pain? No/denies    Pain Score 0-No pain    Multiple Pain Sites No             Capillary Blood Glucose: No results found for this or any previous visit (from the past 24 hour(s)).    Social History   Tobacco Use  Smoking Status Former   Current packs/day: 0.00   Average packs/day: 3.0 packs/day for 26.0 years (77.9 ttl pk-yrs)   Types: Cigarettes   Start date: 02/20/1958   Quit date: 02/12/1984   Years since quitting: 39.6  Smokeless Tobacco Former   Types: Chew  Tobacco Comments   Former smoker 08/30/2021    Goals Met:  Independence with exercise equipment Exercise tolerated well No report of concerns or symptoms today Strength training completed today  Goals Unmet:  Not Applicable  Comments: Pt able to follow exercise prescription today without complaint.  Will continue to monitor for progression.    Dr. Erick Blinks is Medical Director for [facility] Pulmonary Rehab.

## 2023-09-16 ENCOUNTER — Encounter (HOSPITAL_COMMUNITY)
Admission: RE | Admit: 2023-09-16 | Discharge: 2023-09-16 | Disposition: A | Discharge status: Home / Self Care | Payer: Medicare Other | Source: Ambulatory Visit | Attending: Cardiology

## 2023-09-16 ENCOUNTER — Other Ambulatory Visit (HOSPITAL_COMMUNITY): Payer: Self-pay

## 2023-09-16 DIAGNOSIS — I272 Pulmonary hypertension, unspecified: Secondary | ICD-10-CM

## 2023-09-16 NOTE — Progress Notes (Signed)
Daily Session Note  Patient Details  Name: Clarence Dawson MRN: [id number] Date of Birth: 11-28-1946 Referring Provider:   Flowsheet Row PULMONARY REHAB OTHER RESP ORIENTATION from 09/03/2023 in [facility] CARDIAC REHABILITATION  Referring Provider Marca Ancona MD       Encounter Date: 09/16/2023  Check In:  Session Check In - 09/16/23 0800       Check-In   Supervising physician immediately available to respond to emergencies See telemetry face sheet for immediately available MD    Location [facility]    Staff Present Ross Ludwig, BS, Exercise Physiologist;Dailin Sosnowski Juanetta Gosling, MA, RCEP, CCRP, Dow Adolph, RN, BSN    Virtual Visit No    Medication changes reported     No    Fall or balance concerns reported    No    Warm-up and Cool-down Performed on first and last piece of equipment    Resistance Training Performed Yes    VAD Patient? No    PAD/SET Patient? No      Pain Assessment   Currently in Pain? No/denies             Capillary Blood Glucose: No results found for this or any previous visit (from the past 24 hour(s)).    Social History   Tobacco Use  Smoking Status Former   Current packs/day: 0.00   Average packs/day: 3.0 packs/day for 26.0 years (77.9 ttl pk-yrs)   Types: Cigarettes   Start date: 02/20/1958   Quit date: 02/12/1984   Years since quitting: 39.6  Smokeless Tobacco Former   Types: Chew  Tobacco Comments   Former smoker 08/30/2021    Goals Met:  Proper associated with RPD/PD & O2 Sat Independence with exercise equipment Using PLB without cueing & demonstrates good technique Exercise tolerated well No report of concerns or symptoms today Strength training completed today  Goals Unmet:  Not Applicable  Comments: Pt able to follow exercise prescription today without complaint.  Will continue to monitor for progression.    Dr. Erick Blinks is Medical Director for [facility] Pulmonary Rehab.

## 2023-09-17 ENCOUNTER — Other Ambulatory Visit (HOSPITAL_COMMUNITY): Payer: Self-pay

## 2023-09-18 ENCOUNTER — Encounter (HOSPITAL_COMMUNITY)
Admission: RE | Admit: 2023-09-18 | Discharge: 2023-09-18 | Disposition: A | Discharge status: Home / Self Care | Payer: Medicare Other | Source: Ambulatory Visit | Attending: Cardiology

## 2023-09-18 ENCOUNTER — Other Ambulatory Visit: Payer: Self-pay | Admitting: Pulmonary Disease

## 2023-09-18 DIAGNOSIS — I272 Pulmonary hypertension, unspecified: Secondary | ICD-10-CM | POA: Diagnosis not present

## 2023-09-18 NOTE — Progress Notes (Signed)
Reviewed home exercise with pt today.  Pt plans to walk and use weights at home for exercise.  Reviewed THR, pulse, RPE, sign and symptoms, pulse oximetery and when to call 911 or MD.  Also discussed weather considerations and indoor options.  Pt voiced understanding.

## 2023-09-18 NOTE — Progress Notes (Signed)
Daily Session Note  Patient Details  Name: Clarence Dawson MRN: [id number] Date of Birth: Sep 08, 1946 Referring Provider:   Flowsheet Row PULMONARY REHAB OTHER RESP ORIENTATION from 09/03/2023 in [facility] CARDIAC REHABILITATION  Referring Provider Marca Ancona MD       Encounter Date: 09/18/2023  Check In:  Session Check In - 09/18/23 0745       Check-In   Supervising physician immediately available to respond to emergencies See telemetry face sheet for immediately available MD    Location [facility]    Staff Present Ross Ludwig, BS, Exercise Physiologist;Jessica Juanetta Gosling, MA, RCEP, CCRP, Harolyn Rutherford, RN, BSN    Virtual Visit No    Medication changes reported     No    Fall or balance concerns reported    No    Tobacco Cessation No Change    Warm-up and Cool-down Performed on first and last piece of equipment    Resistance Training Performed Yes    VAD Patient? No      Pain Assessment   Currently in Pain? No/denies             Capillary Blood Glucose: No results found for this or any previous visit (from the past 24 hour(s)).    Social History   Tobacco Use  Smoking Status Former   Current packs/day: 0.00   Average packs/day: 3.0 packs/day for 26.0 years (77.9 ttl pk-yrs)   Types: Cigarettes   Start date: 02/20/1958   Quit date: 02/12/1984   Years since quitting: 39.6  Smokeless Tobacco Former   Types: Chew  Tobacco Comments   Former smoker 08/30/2021    Goals Met:  Proper associated with RPD/PD & O2 Sat Independence with exercise equipment Using PLB without cueing & demonstrates good technique Exercise tolerated well Queuing for purse lip breathing No report of concerns or symptoms today Strength training completed today  Goals Unmet:  Not Applicable  Comments: Pt able to follow exercise prescription today without complaint.  Will continue to monitor for progression.    Dr. Erick Blinks is Medical Director for  [facility] Pulmonary Rehab.

## 2023-09-21 ENCOUNTER — Encounter (HOSPITAL_COMMUNITY)
Admission: RE | Admit: 2023-09-21 | Discharge: 2023-09-21 | Disposition: A | Discharge status: Home / Self Care | Payer: Medicare Other | Source: Ambulatory Visit | Attending: Cardiology | Admitting: Cardiology

## 2023-09-21 DIAGNOSIS — I272 Pulmonary hypertension, unspecified: Secondary | ICD-10-CM | POA: Diagnosis not present

## 2023-09-21 NOTE — Progress Notes (Signed)
Daily Session Note  Patient Details  Name: MALIN EICKHOFF MRN: [id number] Date of Birth: [birth year] Referring Provider:   Flowsheet Row PULMONARY REHAB OTHER RESP ORIENTATION from 09/03/2023 in [facility] CARDIAC REHABILITATION  Referring Provider Marca Ancona MD       Encounter Date: 09/21/2023  Check In:  Session Check In - 09/21/23 0745       Check-In   Supervising physician immediately available to respond to emergencies See telemetry face sheet for immediately available MD    Location [facility]    Staff Present Ross Ludwig, BS, Exercise Physiologist;Debra Laural Benes, RN, BSN;Rawson Minix, RN;Daphyne Daphine Deutscher, RN, BSN;Jessica Hawkins, MA, RCEP, CCRP, CCET    Virtual Visit No    Medication changes reported     No    Fall or balance concerns reported    No    Tobacco Cessation No Change    Warm-up and Cool-down Performed on first and last piece of equipment    Resistance Training Performed Yes    VAD Patient? No    PAD/SET Patient? No      Pain Assessment   Currently in Pain? No/denies    Pain Score 0-No pain    Multiple Pain Sites No             Capillary Blood Glucose: No results found for this or any previous visit (from the past 24 hour(s)).    Social History   Tobacco Use  Smoking Status Former   Current packs/day: 0.00   Average packs/day: 3.0 packs/day for 26.0 years (77.9 ttl pk-yrs)   Types: Cigarettes   Start date: 02/20/1958   Quit date: 02/12/1984   Years since quitting: 39.6  Smokeless Tobacco Former   Types: Chew  Tobacco Comments   Former smoker 08/30/2021    Goals Met:  Proper associated with RPD/PD & O2 Sat Improved SOB with ADL's Exercise tolerated well No report of concerns or symptoms today Strength training completed today  Goals Unmet:  Not Applicable  Comments: Pt able to follow exercise prescription today without complaint.  Will continue to monitor for progression.    Dr. Erick Blinks is  Medical Director for [facility] Pulmonary Rehab.

## 2023-09-23 ENCOUNTER — Encounter (HOSPITAL_COMMUNITY)
Admission: RE | Admit: 2023-09-23 | Discharge: 2023-09-23 | Disposition: A | Discharge status: Home / Self Care | Payer: Medicare Other | Source: Ambulatory Visit | Attending: Cardiology | Admitting: Cardiology

## 2023-09-23 DIAGNOSIS — I272 Pulmonary hypertension, unspecified: Secondary | ICD-10-CM

## 2023-09-23 NOTE — Progress Notes (Signed)
Daily Session Note  Patient Details  Name: Clarence Dawson MRN: [id number] Date of Birth: 14-Jul-1946 Referring Provider:   Flowsheet Row PULMONARY REHAB OTHER RESP ORIENTATION from 09/03/2023 in [facility] CARDIAC REHABILITATION  Referring Provider Marca Ancona MD       Encounter Date: 09/23/2023  Check In:  Session Check In - 09/23/23 0815       Check-In   Supervising physician immediately available to respond to emergencies See telemetry face sheet for immediately available MD    Location [facility]    Staff Present Ross Ludwig, BS, Exercise Physiologist;Theoden Mauch Juanetta Gosling, MA, RCEP, CCRP, CCET;Hillary Troutman BSN, RN    Virtual Visit No    Medication changes reported     No    Fall or balance concerns reported    No    Warm-up and Cool-down Performed on first and last piece of equipment    Resistance Training Performed Yes    VAD Patient? No    PAD/SET Patient? No      Pain Assessment   Currently in Pain? No/denies             Capillary Blood Glucose: No results found for this or any previous visit (from the past 24 hour(s)).    Social History   Tobacco Use  Smoking Status Former   Current packs/day: 0.00   Average packs/day: 3.0 packs/day for 26.0 years (77.9 ttl pk-yrs)   Types: Cigarettes   Start date: 02/20/1958   Quit date: 02/12/1984   Years since quitting: 39.6  Smokeless Tobacco Former   Types: Chew  Tobacco Comments   Former smoker 08/30/2021    Goals Met:  Proper associated with RPD/PD & O2 Sat Independence with exercise equipment Exercise tolerated well No report of concerns or symptoms today Strength training completed today  Goals Unmet:  Not Applicable  Comments: Pt able to follow exercise prescription today without complaint.  Will continue to monitor for progression.    Dr. Erick Blinks is Medical Director for [facility] Pulmonary Rehab.

## 2023-09-25 ENCOUNTER — Encounter (HOSPITAL_COMMUNITY)
Admission: RE | Admit: 2023-09-25 | Discharge: 2023-09-25 | Disposition: A | Discharge status: Home / Self Care | Payer: Medicare Other | Source: Ambulatory Visit | Attending: Cardiology

## 2023-09-25 DIAGNOSIS — I272 Pulmonary hypertension, unspecified: Secondary | ICD-10-CM

## 2023-09-25 NOTE — Progress Notes (Signed)
Daily Session Note  Patient Details  Name: Clarence Dawson MRN: [id number] Date of Birth: 11-Jan-1946 Referring Provider:   Flowsheet Row PULMONARY REHAB OTHER RESP ORIENTATION from 09/03/2023 in [facility] CARDIAC REHABILITATION  Referring Provider Marca Ancona MD       Encounter Date: 09/25/2023  Check In:  Session Check In - 09/25/23 0759       Check-In   Supervising physician immediately available to respond to emergencies See telemetry face sheet for immediately available MD    Location [facility]    Staff Present Ross Ludwig, BS, Exercise Physiologist;Yao Hyppolite Juanetta Gosling, MA, RCEP, CCRP, Dow Adolph, RN, BSN    Virtual Visit No    Medication changes reported     No    Fall or balance concerns reported    No    Warm-up and Cool-down Performed on first and last piece of equipment    Resistance Training Performed Yes    VAD Patient? No    PAD/SET Patient? No      Pain Assessment   Currently in Pain? No/denies             Capillary Blood Glucose: No results found for this or any previous visit (from the past 24 hour(s)).    Social History   Tobacco Use  Smoking Status Former   Current packs/day: 0.00   Average packs/day: 3.0 packs/day for 26.0 years (77.9 ttl pk-yrs)   Types: Cigarettes   Start date: 02/20/1958   Quit date: 02/12/1984   Years since quitting: 39.6  Smokeless Tobacco Former   Types: Chew  Tobacco Comments   Former smoker 08/30/2021    Goals Met:  Proper associated with RPD/PD & O2 Sat Independence with exercise equipment Using PLB without cueing & demonstrates good technique Exercise tolerated well No report of concerns or symptoms today Strength training completed today  Goals Unmet:  Not Applicable  Comments: Pt able to follow exercise prescription today without complaint.  Will continue to monitor for progression.    Dr. Erick Blinks is Medical Director for [facility] Pulmonary Rehab.

## 2023-09-28 ENCOUNTER — Encounter (HOSPITAL_COMMUNITY): Payer: Medicare Other

## 2023-09-30 ENCOUNTER — Encounter (HOSPITAL_COMMUNITY)
Admission: RE | Admit: 2023-09-30 | Discharge: 2023-09-30 | Disposition: A | Discharge status: Home / Self Care | Payer: Medicare Other | Source: Ambulatory Visit | Attending: Cardiology | Admitting: Cardiology

## 2023-09-30 DIAGNOSIS — R0609 Other forms of dyspnea: Secondary | ICD-10-CM | POA: Diagnosis not present

## 2023-09-30 DIAGNOSIS — I272 Pulmonary hypertension, unspecified: Secondary | ICD-10-CM | POA: Insufficient documentation

## 2023-09-30 DIAGNOSIS — Z87891 Personal history of nicotine dependence: Secondary | ICD-10-CM | POA: Diagnosis not present

## 2023-09-30 NOTE — Progress Notes (Signed)
Daily Session Note  Patient Details  Name: Clarence Dawson MRN: [id number] Date of Birth: 03-Mar-1946 Referring Provider:   Flowsheet Row PULMONARY REHAB OTHER RESP ORIENTATION from 09/03/2023 in [facility] CARDIAC REHABILITATION  Referring Provider Marca Ancona MD       Encounter Date: 09/30/2023  Check In:  Session Check In - 09/30/23 0745       Check-In   Supervising physician immediately available to respond to emergencies See telemetry face sheet for immediately available MD    Location [facility]    Staff Present Ross Ludwig, BS, Exercise Physiologist;Debra Laural Benes, RN, BSN;Hillary Troutman BSN, RN    Virtual Visit No    Tobacco Cessation No Change    Warm-up and Cool-down Performed on first and last piece of Public house manager Performed Yes    VAD Patient? No    PAD/SET Patient? No      Pain Assessment   Currently in Pain? No/denies    Pain Score 0-No pain    Multiple Pain Sites No             Capillary Blood Glucose: No results found for this or any previous visit (from the past 24 hour(s)).    Social History   Tobacco Use  Smoking Status Former   Current packs/day: 0.00   Average packs/day: 3.0 packs/day for 26.0 years (77.9 ttl pk-yrs)   Types: Cigarettes   Start date: 02/20/1958   Quit date: 02/12/1984   Years since quitting: 39.6  Smokeless Tobacco Former   Types: Chew  Tobacco Comments   Former smoker 08/30/2021    Goals Met:  Independence with exercise equipment Exercise tolerated well No report of concerns or symptoms today Strength training completed today  Goals Unmet:  Not Applicable  Comments: Pt able to follow exercise prescription today without complaint.  Will continue to monitor for progression.    Dr. Erick Blinks is Medical Director for [facility] Pulmonary Rehab.

## 2023-10-02 ENCOUNTER — Encounter (HOSPITAL_COMMUNITY)
Admission: RE | Admit: 2023-10-02 | Discharge: 2023-10-02 | Disposition: A | Discharge status: Home / Self Care | Payer: Medicare Other | Source: Ambulatory Visit | Attending: Cardiology | Admitting: Cardiology

## 2023-10-02 DIAGNOSIS — I272 Pulmonary hypertension, unspecified: Secondary | ICD-10-CM | POA: Diagnosis not present

## 2023-10-02 NOTE — Progress Notes (Signed)
Daily Session Note  Patient Details  Name: Clarence Dawson MRN: [id number] Date of Birth: 08-08-1946 Referring Provider:   Flowsheet Row PULMONARY REHAB OTHER RESP ORIENTATION from 09/03/2023 in [facility] CARDIAC REHABILITATION  Referring Provider Marca Ancona MD       Encounter Date: 10/02/2023  Check In:  Session Check In - 10/02/23 0745       Check-In   Supervising physician immediately available to respond to emergencies See telemetry face sheet for immediately available ER MD    Location [facility]    Staff Present Rodena Medin, RN, BSN;Heather Fredric Mare, BS, Exercise Physiologist    Virtual Visit No    Medication changes reported     No    Fall or balance concerns reported    No    Warm-up and Cool-down Performed on first and last piece of equipment    VAD Patient? No    PAD/SET Patient? No      Pain Assessment   Currently in Pain? No/denies    Pain Score 0-No pain    Multiple Pain Sites No             Capillary Blood Glucose: No results found for this or any previous visit (from the past 24 hour(s)).    Social History   Tobacco Use  Smoking Status Former   Current packs/day: 0.00   Average packs/day: 3.0 packs/day for 26.0 years (77.9 ttl pk-yrs)   Types: Cigarettes   Start date: 02/20/1958   Quit date: 02/12/1984   Years since quitting: 39.6  Smokeless Tobacco Former   Types: Chew  Tobacco Comments   Former smoker 08/30/2021    Goals Met:  Proper associated with RPD/PD & O2 Sat Independence with exercise equipment Using PLB without cueing & demonstrates good technique Exercise tolerated well No report of concerns or symptoms today Strength training completed today  Goals Unmet:  Not Applicable  Comments: Pt able to follow exercise prescription today without complaint.  Will continue to monitor for progression.    Dr. Erick Blinks is Medical Director for [facility] Pulmonary Rehab.

## 2023-10-05 ENCOUNTER — Ambulatory Visit: Payer: Medicare Other | Admitting: Pulmonary Disease

## 2023-10-05 ENCOUNTER — Encounter (HOSPITAL_COMMUNITY)
Admission: RE | Admit: 2023-10-05 | Discharge: 2023-10-05 | Disposition: A | Discharge status: Home / Self Care | Payer: Medicare Other | Source: Ambulatory Visit | Attending: Cardiology

## 2023-10-05 DIAGNOSIS — I272 Pulmonary hypertension, unspecified: Secondary | ICD-10-CM | POA: Diagnosis not present

## 2023-10-05 NOTE — Progress Notes (Signed)
Daily Session Note  Patient Details  Name: SAIFAN RAYFORD MRN: [id number] Date of Birth: 19-Feb-1946 Referring Provider:   Flowsheet Row PULMONARY REHAB OTHER RESP ORIENTATION from 09/03/2023 in [facility] CARDIAC REHABILITATION  Referring Provider Marca Ancona MD       Encounter Date: 10/05/2023  Check In:  Session Check In - 10/05/23 0745       Check-In   Supervising physician immediately available to respond to emergencies See telemetry face sheet for immediately available MD    Location [facility]    Staff Present Fabio Pierce, MA, RCEP, CCRP, Harolyn Rutherford, RN, BSN    Virtual Visit No    Medication changes reported     No    Fall or balance concerns reported    No    Tobacco Cessation No Change    Warm-up and Cool-down Performed on first and last piece of equipment    Resistance Training Performed Yes    VAD Patient? No      Pain Assessment   Currently in Pain? No/denies             Capillary Blood Glucose: No results found for this or any previous visit (from the past 24 hour(s)).    Social History   Tobacco Use  Smoking Status Former   Current packs/day: 0.00   Average packs/day: 3.0 packs/day for 26.0 years (77.9 ttl pk-yrs)   Types: Cigarettes   Start date: 02/20/1958   Quit date: 02/12/1984   Years since quitting: 39.6  Smokeless Tobacco Former   Types: Chew  Tobacco Comments   Former smoker 08/30/2021    Goals Met:  Proper associated with RPD/PD & O2 Sat Independence with exercise equipment Using PLB without cueing & demonstrates good technique Exercise tolerated well Queuing for purse lip breathing No report of concerns or symptoms today Strength training completed today  Goals Unmet:  Not Applicable  Comments: Pt able to follow exercise prescription today without complaint.  Will continue to monitor for progression.    Dr. Erick Blinks is Medical Director for [facility] Pulmonary Rehab.

## 2023-10-07 ENCOUNTER — Encounter (HOSPITAL_COMMUNITY): Payer: Self-pay | Admitting: *Deleted

## 2023-10-07 ENCOUNTER — Encounter (HOSPITAL_COMMUNITY)
Admission: RE | Admit: 2023-10-07 | Discharge: 2023-10-07 | Disposition: A | Discharge status: Home / Self Care | Payer: Medicare Other | Source: Ambulatory Visit | Attending: Cardiology

## 2023-10-07 DIAGNOSIS — I272 Pulmonary hypertension, unspecified: Secondary | ICD-10-CM

## 2023-10-07 NOTE — Progress Notes (Signed)
Pulmonary Individual Treatment Plan  Patient Details  Name: Clarence Dawson MRN: [id number] Date of Birth: 18-Jan-1946 Referring Provider:   Flowsheet Row PULMONARY REHAB OTHER RESP ORIENTATION from 09/03/2023 in [facility] CARDIAC REHABILITATION  Referring Provider Marca Ancona MD       Initial Encounter Date:  Flowsheet Row PULMONARY REHAB OTHER RESP ORIENTATION from 09/03/2023 in [place] CARDIAC REHABILITATION  Date 09/03/23       Visit Diagnosis: Pulmonary hypertension (HCC)  Patient's Home Medications on Admission:   Current Outpatient Medications:    acetaminophen (TYLENOL) 500 MG tablet, Take 1,000 mg by mouth 3 (three) times daily., Disp: , Rfl:    albuterol (PROVENTIL) (2.5 MG/3ML) 0.083% nebulizer solution, TAKE 1 VIAL VIA NEBULIZATION EVERY 6 HOURS AS NEEDED FOR WHEEZING OR SHORTNESS OF BREATH., Disp: 90 mL, Rfl: 2   albuterol (VENTOLIN HFA) 108 (90 Base) MCG/ACT inhaler, Inhale 2 puffs into the lungs every 4 (four) hours as needed for shortness of breath (only if you can't catch your breath/ asthma)., Disp: 18 g, Rfl: 5   atorvastatin (LIPITOR) 20 MG tablet, Take 20 mg by mouth at bedtime., Disp: , Rfl:    bisoprolol (ZEBETA) 5 MG tablet, TAKE 1 AND 1/2 TABLET BY MOUTH TWICE DAILY., Disp: 90 tablet, Rfl: 3   BREZTRI AEROSPHERE 160-9-4.8 MCG/ACT AERO, INHALE 2 PUFFS INTO THE LUNGS TWICE DAILY., Disp: 10.7 g, Rfl: 5   Carboxymethylcellul-Glycerin (LUBRICATING EYE DROPS OP), Place 1 drop into the right eye 2 (two) times a week. Clear eyes, Disp: , Rfl:    diltiazem (CARTIA XT) 120 MG 24 hr capsule, TAKE (1) CAPSULE BY MOUTH DAILY, MAY TAKE A EXTRA CAPSULE DAILY FOR BREAKTHROUGH AFIB., Disp: 180 capsule, Rfl: 3   dofetilide (TIKOSYN) 500 MCG capsule, Take 1 capsule (500 mcg total) by mouth 2 (two) times daily., Disp: 180 capsule, Rfl: 3   Dupilumab (DUPIXENT) 300 MG/2ML SOPN, Inject 300 mg into the skin every 14 (fourteen) days., Disp: 12 mL, Rfl: 1   EPINEPHrine 0.3 mg/0.3  mL IJ SOAJ injection, Inject 0.3 mg into the muscle as needed for anaphylaxis., Disp: 1 each, Rfl: 5   furosemide (LASIX) 40 MG tablet, Take 1 tablet (40 mg total) by mouth in the morning AND 0.5 tablets (20 mg total) every evening., Disp: 90 tablet, Rfl: 3   glipiZIDE (GLUCOTROL XL) 5 MG 24 hr tablet, Take 5 mg by mouth daily before breakfast., Disp: , Rfl:    hydrocortisone cream 0.5 %, Apply 1 Application topically daily as needed for itching., Disp: , Rfl:    JARDIANCE 25 MG TABS tablet, Take 25 mg by mouth daily before breakfast., Disp: , Rfl:    levothyroxine (SYNTHROID) 150 MCG tablet, Take 150 mcg by mouth daily before breakfast., Disp: , Rfl:    losartan (COZAAR) 50 MG tablet, Take 50 mg by mouth at bedtime., Disp: , Rfl:    metFORMIN (GLUCOPHAGE-XR) 500 MG 24 hr tablet, Take 500 mg by mouth in the morning and at bedtime., Disp: , Rfl:    Multiple Vitamin (MULTIVITAMIN) tablet, Take 1 tablet by mouth daily., Disp: , Rfl:    omeprazole (PRILOSEC OTC) 20 MG tablet, Take 20 mg by mouth every morning., Disp: , Rfl:    PRESCRIPTION MEDICATION, Thumper vest, Disp: , Rfl:    Respiratory Therapy Supplies (FLUTTER) DEVI, Use as directed, Disp: 1 each, Rfl: 0   rivaroxaban (XARELTO) 20 MG TABS tablet, Take 20 mg by mouth at bedtime., Disp: , Rfl:    sodium  chloride (OCEAN) 0.65 % SOLN nasal spray, Place 1 spray into both nostrils as needed for congestion., Disp: , Rfl:   Past Medical History: Past Medical History:  Diagnosis Date   Allergic rhinitis    Aortic valve disorder    Asthma    since childhood- seasonal allergies induced   Cancer (HCC)    Skin cancer- squamous, basal   Carotid artery stenosis    Essential hypertension    Full dentures    GERD (gastroesophageal reflux disease)    H/O hiatal hernia    Hemorrhage of rectum    Hyperlipidemia    Hypothyroidism    Male circumcision    OSA (obstructive sleep apnea)    Osteoarthritis    Pacemaker    Oct 2005 in [place].   PAF  (paroxysmal atrial fibrillation) (HCC)    Pneumonia    "several Times" 2015 last time   Primary localized osteoarthritis of right knee 08/11/2017   RBBB (right bundle branch block)    Sinoatrial node dysfunction (HCC)    Syncope    Tricuspid valve disorder    Type 2 diabetes mellitus (HCC)    Type II    Tobacco Use: Social History   Tobacco Use  Smoking Status Former   Current packs/day: 0.00   Average packs/day: 3.0 packs/day for 26.0 years (77.9 ttl pk-yrs)   Types: Cigarettes   Start date: 02/20/1958   Quit date: 02/12/1984   Years since quitting: 39.6  Smokeless Tobacco Former   Types: Chew  Tobacco Comments   Former smoker 08/30/2021    Labs: Review Flowsheet  More data exists      Latest Ref Rng & Units 10/20/2016 08/13/2017 09/18/2019 08/28/2023 09/01/2023  Labs for ITP Cardiac and Pulmonary Rehab  Cholestrol 0 - 200 mg/dL - - - - 956   LDL (calc) 0 - 99 mg/dL - - - - 73   HDL-C >21 mg/dL - - - - 34   Trlycerides <150 mg/dL - - - - 308   Hemoglobin A1c 4.8 - 5.6 % 8.1  7.2  7.2  - -  PH, Arterial 7.35 - 7.45 - - - 7.438  -  PCO2 arterial 32 - 48 mmHg - - - 32.7  -  Bicarbonate 20.0 - 28.0 mmol/L - - - 22.1  25.0  24.7  -  TCO2 22 - 32 mmol/L - - - 23  26  26   -  Acid-base deficit 0.0 - 2.0 mmol/L - - - 1.0  -  O2 Saturation % - - - 95  65  64  -    Details       Multiple values from one day are sorted in reverse-chronological order         Capillary Blood Glucose: Lab Results  Component Value Date   GLUCAP 139 (H) 09/09/2023   GLUCAP 207 (H) 09/09/2023   GLUCAP 106 (H) 09/07/2023   GLUCAP 119 (H) 09/07/2023   GLUCAP 130 (H) 09/04/2023     Pulmonary Assessment Scores:  Pulmonary Assessment Scores     Row Name 09/04/23 0821         ADL UCSD   ADL Phase Entry     SOB Score total 47     Rest 1     Walk 3     Stairs 3     Bath 1     Dress 2     Shop 2       CAT Score   CAT  Score 16       mMRC Score   mMRC Score 1              UCSD: Self-administered rating of dyspnea associated with activities of daily living (ADLs) 6-point scale (0 = "not at all" to 5 = "maximal or unable to do because of breathlessness")  Scoring Scores range from 0 to 120.  Minimally important difference is 5 units  CAT: CAT can identify the health impairment of COPD patients and is better correlated with disease progression.  CAT has a scoring range of zero to 40. The CAT score is classified into four groups of low (less than 10), medium (10 - 20), high (21-30) and very high (31-40) based on the impact level of disease on health status. A CAT score over 10 suggests significant symptoms.  A worsening CAT score could be explained by an exacerbation, poor medication adherence, poor inhaler technique, or progression of COPD or comorbid conditions.  CAT MCID is 2 points  mMRC: mMRC (Modified Medical Research Council) Dyspnea Scale is used to assess the degree of baseline functional disability in patients of respiratory disease due to dyspnea. No minimal important difference is established. A decrease in score of 1 point or greater is considered a positive change.   Pulmonary Function Assessment:   Exercise Target Goals: Exercise Program Goal: Individual exercise prescription set using results from initial 6 min walk test and THRR while considering  patient's activity barriers and safety.   Exercise Prescription Goal: Initial exercise prescription builds to 30-45 minutes a day of aerobic activity, 2-3 days per week.  Home exercise guidelines will be given to patient during program as part of exercise prescription that the participant will acknowledge.  Activity Barriers & Risk Stratification:  Activity Barriers & Cardiac Risk Stratification - 09/03/23 0844       Activity Barriers & Cardiac Risk Stratification   Activity Barriers Deconditioning;Muscular Weakness;Shortness of Breath;Other (comment);Arthritis;Left Knee Replacement;Right Knee  Replacement;Joint Problems;Balance Concerns;History of Falls;Decreased Ventricular Function    Comments grip effected by arthritis, bilateral knee arth, and shoulders             6 Minute Walk:  6 Minute Walk     Row Name 09/03/23 1012         6 Minute Walk   Phase Initial     Distance 1120 feet     Walk Time 5.17 minutes     # of Rest Breaks 1  50 sec     MPH 2.46     METS 1.69     RPE 14     Perceived Dyspnea  2     VO2 Peak 5.91     Symptoms Yes (comment)     Comments SOB, calves painful 8/10     Resting HR 60 bpm     Resting BP 124/72     Resting Oxygen Saturation  92 %     Exercise Oxygen Saturation  during 6 min walk 87 %     Max Ex. HR 64 bpm     Max Ex. BP 146/64     2 Minute Post BP 132/70       Interval HR   1 Minute HR 60     2 Minute HR 61     3 Minute HR 62     4 Minute HR 64     5 Minute HR 60     6 Minute HR 61     2 Minute  Post HR 60     Interval Heart Rate? Yes       Interval Oxygen   Interval Oxygen? Yes     Baseline Oxygen Saturation % 92 %     1 Minute Oxygen Saturation % 87 %     1 Minute Liters of Oxygen 4 L  pulsed     2 Minute Oxygen Saturation % 87 %     2 Minute Liters of Oxygen 4 L     3 Minute Oxygen Saturation % 88 %     3 Minute Liters of Oxygen 4 L     4 Minute Oxygen Saturation % 89 %     4 Minute Liters of Oxygen 4 L     5 Minute Oxygen Saturation % 87 %     5 Minute Liters of Oxygen 4 L     6 Minute Oxygen Saturation % 88 %     6 Minute Liters of Oxygen 4 L     2 Minute Post Oxygen Saturation % 90 %     2 Minute Post Liters of Oxygen 4 L              Oxygen Initial Assessment:  Oxygen Initial Assessment - 09/03/23 0849       Home Oxygen   Home Oxygen Device Portable Concentrator;Home Concentrator    Sleep Oxygen Prescription BiPAP;Continuous    Liters per minute 4    Home Exercise Oxygen Prescription Continuous    Liters per minute 4    Home Resting Oxygen Prescription Continuous    Liters per minute 3     Compliance with Home Oxygen Use Yes      Initial 6 min Walk   Oxygen Used Pulsed;Portable Concentrator    Liters per minute 4      Program Oxygen Prescription   Program Oxygen Prescription Continuous;E-Tanks    Liters per minute 3      Intervention   Short Term Goals To learn and exhibit compliance with exercise, home and travel O2 prescription;To learn and understand importance of monitoring SPO2 with pulse oximeter and demonstrate accurate use of the pulse oximeter.;To learn and understand importance of maintaining oxygen saturations>88%;To learn and demonstrate proper pursed lip breathing techniques or other breathing techniques. ;To learn and demonstrate proper use of respiratory medications    Long  Term Goals Exhibits compliance with exercise, home  and travel O2 prescription;Maintenance of O2 saturations>88%;Compliance with respiratory medication;Verbalizes importance of monitoring SPO2 with pulse oximeter and return demonstration;Exhibits proper breathing techniques, such as pursed lip breathing or other method taught during program session;Demonstrates proper use of MDI's             Oxygen Re-Evaluation:  Oxygen Re-Evaluation     Row Name 09/03/23 1049 09/16/23 0838           Program Oxygen Prescription   Program Oxygen Prescription -- Continuous;E-Tanks      Liters per minute -- 6      Comments -- Clarence Dawson is on 6 liters when exercising        Home Oxygen   Home Oxygen Device -- Portable Concentrator;Home Concentrator      Sleep Oxygen Prescription -- BiPAP;Continuous      Liters per minute -- 4      Home Exercise Oxygen Prescription -- Continuous      Liters per minute -- 4      Home Resting Oxygen Prescription -- Continuous      Liters per minute --  3      Compliance with Home Oxygen Use -- Yes        Goals/Expected Outcomes   Short Term Goals To learn and demonstrate proper pursed lip breathing techniques or other breathing techniques.  To learn and  demonstrate proper pursed lip breathing techniques or other breathing techniques.       Long  Term Goals Exhibits proper breathing techniques, such as pursed lip breathing or other method taught during program session Exhibits proper breathing techniques, such as pursed lip breathing or other method taught during program session      Comments Reviewed PLB technique with pt.  Talked about how it works and it's importance in maintaining their exercise saturations. Clarence Dawson has noticed an slight increase in hois breathing. He is able to go around the grocery store without giving out and being SOB. He uses PLB when he feels SOB and it has helped him bring his oxygen levels up. He checks his levels during the day and stated that they stay WNL.      Goals/Expected Outcomes Short: Become more profiecient at using PLB.   Long: Become independent at using PLB. Short: continue to monitor oxygen levels   long term: continue to be compliant with oxygen               Oxygen Discharge (Final Oxygen Re-Evaluation):  Oxygen Re-Evaluation - 09/16/23 0838       Program Oxygen Prescription   Program Oxygen Prescription Continuous;E-Tanks    Liters per minute 6    Comments Clarence Dawson is on 6 liters when exercising      Home Oxygen   Home Oxygen Device Portable Concentrator;Home Concentrator    Sleep Oxygen Prescription BiPAP;Continuous    Liters per minute 4    Home Exercise Oxygen Prescription Continuous    Liters per minute 4    Home Resting Oxygen Prescription Continuous    Liters per minute 3    Compliance with Home Oxygen Use Yes      Goals/Expected Outcomes   Short Term Goals To learn and demonstrate proper pursed lip breathing techniques or other breathing techniques.     Long  Term Goals Exhibits proper breathing techniques, such as pursed lip breathing or other method taught during program session    Comments Clarence Dawson has noticed an slight increase in hois breathing. He is able to go around the grocery store  without giving out and being SOB. He uses PLB when he feels SOB and it has helped him bring his oxygen levels up. He checks his levels during the day and stated that they stay WNL.    Goals/Expected Outcomes Short: continue to monitor oxygen levels   long term: continue to be compliant with oxygen             Initial Exercise Prescription:  Initial Exercise Prescription - 09/03/23 1000       Date of Initial Exercise RX and Referring Provider   Date 09/03/23    Referring Provider Marca Ancona MD      Oxygen   Oxygen Continuous    Liters 4    Maintain Oxygen Saturation 88% or higher      Treadmill   MPH 2.2    Grade 0.5    Minutes 15    METs 2.84      REL-XR   Level 3    Speed 50    Minutes 15    METs 2.3      Prescription Details  Frequency (times per week) 3    Duration Progress to 30 minutes of continuous aerobic without signs/symptoms of physical distress      Intensity   THRR 40-80% of Max Heartrate 93-126    Ratings of Perceived Exertion 11-13    Perceived Dyspnea 0-4      Progression   Progression Continue to progress workloads to maintain intensity without signs/symptoms of physical distress.      Resistance Training   Training Prescription Yes    Weight 5 lb    Reps 10-15             Perform Capillary Blood Glucose checks as needed.  Exercise Prescription Changes:   Exercise Prescription Changes     Row Name 09/03/23 1000 09/21/23 1300           Response to Exercise   Blood Pressure (Admit) 124/72 132/74      Blood Pressure (Exercise) 146/64 108/58      Blood Pressure (Exit) 120/62 128/58      Heart Rate (Admit) 60 bpm 60 bpm      Heart Rate (Exercise) 64 bpm 60 bpm      Heart Rate (Exit) 60 bpm 60 bpm      Oxygen Saturation (Admit) 92 % 90 %      Oxygen Saturation (Exercise) 87 % 88 %      Oxygen Saturation (Exit) 90 % 97 %      Rating of Perceived Exertion (Exercise) 14 13      Perceived Dyspnea (Exercise) 2 2      Symptoms  SOB, calves pain 8/10 --      Comments walk test results --      Duration -- Continue with 30 min of aerobic exercise without signs/symptoms of physical distress.      Intensity -- THRR unchanged        Progression   Progression -- Continue to progress workloads to maintain intensity without signs/symptoms of physical distress.        Resistance Training   Training Prescription -- Yes      Weight -- 5 lbs      Reps -- 10-15      Time -- 1 Minutes        Oxygen   Oxygen -- Continuous      Liters -- 4        Treadmill   MPH -- 2      Grade -- 0.5      Minutes -- 15      METs -- 2.67        REL-XR   Level -- 3      Speed -- 51      Minutes -- 15      METs -- 2.5        Oxygen   Maintain Oxygen Saturation -- 88% or higher               Exercise Comments:   Exercise Goals and Review:   Exercise Goals     Row Name 09/03/23 1031             Exercise Goals   Increase Physical Activity Yes       Intervention Provide advice, education, support and counseling about physical activity/exercise needs.;Develop an individualized exercise prescription for aerobic and resistive training based on initial evaluation findings, risk stratification, comorbidities and participant's personal goals.       Expected Outcomes Short Term: Attend rehab on a regular basis  to increase amount of physical activity.;Long Term: Add in home exercise to make exercise part of routine and to increase amount of physical activity.;Long Term: Exercising regularly at least 3-5 days a week.       Increase Strength and Stamina Yes       Intervention Provide advice, education, support and counseling about physical activity/exercise needs.;Develop an individualized exercise prescription for aerobic and resistive training based on initial evaluation findings, risk stratification, comorbidities and participant's personal goals.       Expected Outcomes Short Term: Increase workloads from initial exercise  prescription for resistance, speed, and METs.;Short Term: Perform resistance training exercises routinely during rehab and add in resistance training at home;Long Term: Improve cardiorespiratory fitness, muscular endurance and strength as measured by increased METs and functional capacity ( )       Able to understand and use rate of perceived exertion (RPE) scale Yes       Intervention Provide education and explanation on how to use RPE scale       Expected Outcomes Short Term: Able to use RPE daily in rehab to express subjective intensity level;Long Term:  Able to use RPE to guide intensity level when exercising independently       Able to understand and use Dyspnea scale Yes       Intervention Provide education and explanation on how to use Dyspnea scale       Expected Outcomes Short Term: Able to use Dyspnea scale daily in rehab to express subjective sense of shortness of breath during exertion;Long Term: Able to use Dyspnea scale to guide intensity level when exercising independently       Knowledge and understanding of Target Heart Rate Range (THRR) Yes       Intervention Provide education and explanation of THRR including how the numbers were predicted and where they are located for reference       Expected Outcomes Short Term: Able to state/look up THRR;Long Term: Able to use THRR to govern intensity when exercising independently;Short Term: Able to use daily as guideline for intensity in rehab       Able to check pulse independently Yes       Intervention Provide education and demonstration on how to check pulse in carotid and radial arteries.;Review the importance of being able to check your own pulse for safety during independent exercise       Expected Outcomes Short Term: Able to explain why pulse checking is important during independent exercise;Long Term: Able to check pulse independently and accurately       Understanding of Exercise Prescription Yes       Intervention Provide  education, explanation, and written materials on patient's individual exercise prescription       Expected Outcomes Long Term: Able to explain home exercise prescription to exercise independently;Short Term: Able to explain program exercise prescription                Exercise Goals Re-Evaluation :  Exercise Goals Re-Evaluation     Row Name 09/03/23 1032 09/16/23 0805 09/18/23 0759 09/21/23 1336       Exercise Goal Re-Evaluation   Exercise Goals Review Able to understand and use rate of perceived exertion (RPE) scale;Able to understand and use Dyspnea scale;Understanding of Exercise Prescription Increase Physical Activity;Increase Strength and Stamina;Understanding of Exercise Prescription Increase Physical Activity;Increase Strength and Stamina;Able to understand and use rate of perceived exertion (RPE) scale;Able to understand and use Dyspnea scale;Knowledge and understanding of Target Heart Rate Range (THRR);Able  to check pulse independently;Understanding of Exercise Prescription Increase Physical Activity;Increase Strength and Stamina;Understanding of Exercise Prescription    Comments Reviewed RPE  and dyspnea scale and program prescription with pt today.  Pt voiced understanding and was given a copy of goals to take home. Clarence Dawson has been doing good with rehab. Clarence Dawson has noticed as slight improvment since starting the program. He was able to go around the grocery store and back to his car without giving out. He does have to stop for short breaks when on the treadmill due to RLE pain. HE also likes coming to class for social. Clarence Dawson is off to a good start in rehab.  He has already started to notice an improvement in his stamina and feeling more clear headed.  Reviewed home exercise with pt today.  Pt plans to walk and use weights at home for exercise.  Reviewed THR, pulse, RPE, sign and symptoms, pulse oximetery and when to call 911 or MD.  Also discussed weather considerations and indoor options.  Pt  voiced understanding. Clarence Dawson has been tolerating exercise well. He has to take breaks on the treadmill due to calf pain. He is walking at a speed/grade of 2.0/0.5. He has increased his level on the XR to level 3. Will continue to monitor and progress as able.    Expected Outcomes Short: Use RPE daily to regulate intensity.  Long: Follow program prescription Short: go over home exericse   long: continue to attend pulmponary rehab. Short: Start to add in more walking at home Long: Conitnue to improve stamina Short term: continue to exercise at current workloads until he builds resisitance   long term: continue to attend rehab             Discharge Exercise Prescription (Final Exercise Prescription Changes):  Exercise Prescription Changes - 09/21/23 1300       Response to Exercise   Blood Pressure (Admit) 132/74    Blood Pressure (Exercise) 108/58    Blood Pressure (Exit) 128/58    Heart Rate (Admit) 60 bpm    Heart Rate (Exercise) 60 bpm    Heart Rate (Exit) 60 bpm    Oxygen Saturation (Admit) 90 %    Oxygen Saturation (Exercise) 88 %    Oxygen Saturation (Exit) 97 %    Rating of Perceived Exertion (Exercise) 13    Perceived Dyspnea (Exercise) 2    Duration Continue with 30 min of aerobic exercise without signs/symptoms of physical distress.    Intensity THRR unchanged      Progression   Progression Continue to progress workloads to maintain intensity without signs/symptoms of physical distress.      Resistance Training   Training Prescription Yes    Weight 5 lbs    Reps 10-15    Time 1 Minutes      Oxygen   Oxygen Continuous    Liters 4      Treadmill   MPH 2    Grade 0.5    Minutes 15    METs 2.67      REL-XR   Level 3    Speed 51    Minutes 15    METs 2.5      Oxygen   Maintain Oxygen Saturation 88% or higher             Nutrition:  Target Goals: Understanding of nutrition guidelines, daily intake of sodium 1500mg , cholesterol 200mg , calories 30% from  fat and 7% or less from saturated fats, daily to have  5 or more servings of fruits and vegetables.  Biometrics:  Pre Biometrics - 09/03/23 1033       Pre Biometrics   Height 5' 9.5" (1.765 m)    Weight 219 lb 14.4 oz (99.7 kg)    Waist Circumference 44 inches    Hip Circumference 42.5 inches    Waist to Hip Ratio 1.04 %    BMI (Calculated) 32.02    Grip Strength 21.7 kg    Single Leg Stand 2.3 seconds              Nutrition Therapy Plan and Nutrition Goals:  Nutrition Therapy & Goals - 09/03/23 0853       Intervention Plan   Intervention Prescribe, educate and counsel regarding individualized specific dietary modifications aiming towards targeted core components such as weight, hypertension, lipid management, diabetes, heart failure and other comorbidities.    Expected Outcomes Short Term Goal: Understand basic principles of dietary content, such as calories, fat, sodium, cholesterol and nutrients.;Long Term Goal: Adherence to prescribed nutrition plan.             Nutrition Assessments:  MEDIFICTS Score Key: >=70 Need to make dietary changes  40-70 Heart Healthy Diet <= 40 Therapeutic Level Cholesterol Diet  Flowsheet Row PULMONARY REHAB OTHER RESPIRATORY from 09/04/2023 in [facility] CARDIAC REHABILITATION  Picture Your Plate Total Score on Admission 58      Picture Your Plate Scores: <24 Unhealthy dietary pattern with much room for improvement. 41-50 Dietary pattern unlikely to meet recommendations for good health and room for improvement. 51-60 More healthful dietary pattern, with some room for improvement.  >60 Healthy dietary pattern, although there may be some specific behaviors that could be improved.    Nutrition Goals Re-Evaluation:  Nutrition Goals Re-Evaluation     Row Name 09/16/23 0821             Goals   Nutrition Goal Healthy eating       Comment Clarence Dawson stated that he eats a lot of chicken throughout that week, he will have some beef and  fish every once in a while. He also eats lots of veggies with his meals and also will do salads. He normally eats eggs some bacon and toast for breaksfest. He has been eating smaller portions, more veggies in the last 6 months and has lost about 20 lbs. He has cut down on sweets but does eat a hersey bar during the week but has not had cake and pie in a while.       Expected Outcome Short term: continue to eat smaller portions   long term: continue to eat healthy                Nutrition Goals Discharge (Final Nutrition Goals Re-Evaluation):  Nutrition Goals Re-Evaluation - 09/16/23 0821       Goals   Nutrition Goal Healthy eating    Comment Clarence Dawson stated that he eats a lot of chicken throughout that week, he will have some beef and fish every once in a while. He also eats lots of veggies with his meals and also will do salads. He normally eats eggs some bacon and toast for breaksfest. He has been eating smaller portions, more veggies in the last 6 months and has lost about 20 lbs. He has cut down on sweets but does eat a hersey bar during the week but has not had cake and pie in a while.    Expected Outcome Short term: continue  to eat smaller portions   long term: continue to eat healthy             Psychosocial: Target Goals: Acknowledge presence or absence of significant depression and/or stress, maximize coping skills, provide positive support system. Participant is able to verbalize types and ability to use techniques and skills needed for reducing stress and depression.  Initial Review & Psychosocial Screening:  Initial Psych Review & Screening - 09/03/23 0853       Initial Review   Current issues with Current Stress Concerns    Source of Stress Concerns Chronic Illness;Unable to perform yard/household activities;Unable to participate in former interests or hobbies;Family    Comments dealing with not being able to go and do like he wants to, loss two wives, last one in March,  sister and husband living with them      Family Dynamics   Good Support System? Yes   daughter in law and grandkids live near by, sister and husband living with him currently     Barriers   Psychosocial barriers to participate in program The patient should benefit from training in stress management and relaxation.;Psychosocial barriers identified (see note)      Screening Interventions   Interventions Encouraged to exercise;To provide support and resources with identified psychosocial needs;Provide feedback about the scores to participant    Expected Outcomes Short Term goal: Utilizing psychosocial counselor, staff and physician to assist with identification of specific Stressors or current issues interfering with healing process. Setting desired goal for each stressor or current issue identified.;Long Term Goal: Stressors or current issues are controlled or eliminated.;Short Term goal: Identification and review with participant of any Quality of Life or Depression concerns found by scoring the questionnaire.;Long Term goal: The participant improves quality of Life and PHQ9 Scores as seen by post scores and/or verbalization of changes             Quality of Life Scores:  Scores of 19 and below usually indicate a poorer quality of life in these areas.  A difference of  2-3 points is a clinically meaningful difference.  A difference of 2-3 points in the total score of the Quality of Life Index has been associated with significant improvement in overall quality of life, self-image, physical symptoms, and general health in studies assessing change in quality of life.   PHQ-9: Review Flowsheet       09/03/2023  Depression screen PHQ 2/9  Decreased Interest 0  Down, Depressed, Hopeless 0  PHQ - 2 Score 0  Altered sleeping 0  Tired, decreased energy 0  Change in appetite 0  Feeling bad or failure about yourself  0  Trouble concentrating 0  Moving slowly or fidgety/restless 0  Suicidal  thoughts 0  PHQ-9 Score 0  Difficult doing work/chores Not difficult at all    Details           Interpretation of Total Score  Total Score Depression Severity:  1-4 = Minimal depression, 5-9 = Mild depression, 10-14 = Moderate depression, 15-19 = Moderately severe depression, 20-27 = Severe depression   Psychosocial Evaluation and Intervention:  Psychosocial Evaluation - 09/03/23 0858       Psychosocial Evaluation & Interventions   Interventions Stress management education;Encouraged to exercise with the program and follow exercise prescription    Comments Clarence Dawson is coming into pulmonary rehab for pulmonary hypertension.  He is a happy and positive person and eager to get going in the program. He had a bad fall  back in April over his oxygen tubing and broke three ribs and collapsed his lung.  Since his fall, he has had to rely on his oxygen more.  Previously, he could go a few hours around the house without it, but now he is on 3-4L constantly for any activity.  He has no history of anixety or depression but lost his 2nd wife to cancer in March of 2023 (1st wife to cancer in 2016).  He enjoys haning out with friends to play card and going to [employer] to read books.   He used to go to [facility] for exercise but the oxygen feels cumberson to him currently but he would like to get back there.  His sister and her husband are currently living with him after they lost their condo, but he enjoys their company and meals together.  He has been working on weight loss and has lost 20 lb since June.  He would like to get down under 200 lb.  He has a new concentrator for oxygen that seems to be working better for him and his breathing.  He wants to get back to whre he was before his fall in April.  He would also like to try to tritrate down on his oxygen needs for activity.  He also has a daughter in law that lives locally with two grandsons that help with yard work.  They also go out to eat at least once a week  together.  He has only been doing some weights and walking in the house for acitivity and would like to get back to more regular exercise again.    Expected Outcomes Short: Attend rehab to build stamina and improve breathing Long: Continue to focus on positives and wean oxygen    Continue Psychosocial Services  Follow up required by staff             Psychosocial Re-Evaluation:  Psychosocial Re-Evaluation     Row Name 09/16/23 0813 09/18/23 0802           Psychosocial Re-Evaluation   Current issues with None Identified None Identified;Current Stress Concerns      Comments Clarence Dawson stated that he has not had any issue with sleep or any major stress factors. He said he has been feeling more energized since starting the program. He talks to his kids and grandkids often and it bring him joy --      Expected Outcomes Short: continue to identify stressors    long term: continue to find ways that make him happy --      Interventions Stress management education;Relaxation education;Encouraged to attend Pulmonary Rehabilitation for the exercise --      Continue Psychosocial Services  Follow up required by staff --               Psychosocial Discharge (Final Psychosocial Re-Evaluation):  Psychosocial Re-Evaluation - 09/18/23 0802       Psychosocial Re-Evaluation   Current issues with None Identified;Current Stress Concerns              Education: Education Goals: Education classes will be provided on a weekly basis, covering required topics. Participant will state understanding/return demonstration of topics presented.  Learning Barriers/Preferences:  Learning Barriers/Preferences - 09/03/23 1040       Learning Barriers/Preferences   Learning Barriers Sight   contacts   Learning Preferences None             Education Topics: How Lungs Work and Diseases: - Discuss  the anatomy of the lungs and diseases that can affect the lungs, such as COPD.   Exercise: -Discuss  the importance of exercise, FITT principles of exercise, normal and abnormal responses to exercise, and how to exercise safely.   Environmental Irritants: -Discuss types of environmental irritants and how to limit exposure to environmental irritants.   Meds/Inhalers and oxygen: - Discuss respiratory medications, definition of an inhaler and oxygen, and the proper way to use an inhaler and oxygen.   Energy Saving Techniques: - Discuss methods to conserve energy and decrease shortness of breath when performing activities of daily living.    Bronchial Hygiene / Breathing Techniques: - Discuss breathing mechanics, pursed-lip breathing technique,  proper posture, effective ways to clear airways, and other functional breathing techniques   Cleaning Equipment: - Provides group verbal and written instruction about the health risks of elevated stress, cause of high stress, and healthy ways to reduce stress.   Nutrition I: Fats: - Discuss the types of cholesterol, what cholesterol does to the body, and how cholesterol levels can be controlled.   Nutrition II: Labels: -Discuss the different components of food labels and how to read food labels.   Respiratory Infections: - Discuss the signs and symptoms of respiratory infections, ways to prevent respiratory infections, and the importance of seeking medical treatment when having a respiratory infection.   Stress I: Signs and Symptoms: - Discuss the causes of stress, how stress may lead to anxiety and depression, and ways to limit stress.   Stress II: Relaxation: -Discuss relaxation techniques to limit stress.   Oxygen for Home/Travel: - Discuss how to prepare for travel when on oxygen and proper ways to transport and store oxygen to ensure safety.   Knowledge Questionnaire Score:  Knowledge Questionnaire Score - 09/04/23 1610       Knowledge Questionnaire Score   Pre Score 18/18             Core Components/Risk  Factors/Patient Goals at Admission:  Personal Goals and Risk Factors at Admission - 09/03/23 0852       Core Components/Risk Factors/Patient Goals on Admission    Weight Management Yes;Obesity;Weight Loss    Intervention Weight Management: Develop a combined nutrition and exercise program designed to reach desired caloric intake, while maintaining appropriate intake of nutrient and fiber, sodium and fats, and appropriate energy expenditure required for the weight goal.;Weight Management: Provide education and appropriate resources to help participant work on and attain dietary goals.;Weight Management/Obesity: Establish reasonable short term and long term weight goals.;Obesity: Provide education and appropriate resources to help participant work on and attain dietary goals.    Admit Weight 219 lb 14.4 oz (99.7 kg)    Goal Weight: Short Term 215 lb (97.5 kg)    Goal Weight: Long Term 199 lb (90.3 kg)    Expected Outcomes Short Term: Continue to assess and modify interventions until short term weight is achieved;Long Term: Adherence to nutrition and physical activity/exercise program aimed toward attainment of established weight goal;Weight Loss: Understanding of general recommendations for a balanced deficit meal plan, which promotes 1-2 lb weight loss per week and includes a negative energy balance of [phone] kcal/d;Understanding recommendations for meals to include 15-35% energy as protein, 25-35% energy from fat, 35-60% energy from carbohydrates, less than 200mg  of dietary cholesterol, 20-35 gm of total fiber daily;Understanding of distribution of calorie intake throughout the day with the consumption of 4-5 meals/snacks    Improve shortness of breath with ADL's Yes    Intervention Provide  education, individualized exercise plan and daily activity instruction to help decrease symptoms of SOB with activities of daily living.    Expected Outcomes Short Term: Improve cardiorespiratory fitness to achieve  a reduction of symptoms when performing ADLs;Long Term: Be able to perform more ADLs without symptoms or delay the onset of symptoms    Increase knowledge of respiratory medications and ability to use respiratory devices properly  Yes    Intervention Provide education and demonstration as needed of appropriate use of medications, inhalers, and oxygen therapy.    Expected Outcomes Short Term: Achieves understanding of medications use. Understands that oxygen is a medication prescribed by physician. Demonstrates appropriate use of inhaler and oxygen therapy.;Long Term: Maintain appropriate use of medications, inhalers, and oxygen therapy.    Diabetes Yes    Intervention Provide education about signs/symptoms and action to take for hypo/hyperglycemia.;Provide education about proper nutrition, including hydration, and aerobic/resistive exercise prescription along with prescribed medications to achieve blood glucose in normal ranges: Fasting glucose 65-99 mg/dL    Expected Outcomes Short Term: Participant verbalizes understanding of the signs/symptoms and immediate care of hyper/hypoglycemia, proper foot care and importance of medication, aerobic/resistive exercise and nutrition plan for blood glucose control.;Long Term: Attainment of HbA1C < 7%.    Heart Failure Yes    Intervention Provide a combined exercise and nutrition program that is supplemented with education, support and counseling about heart failure. Directed toward relieving symptoms such as shortness of breath, decreased exercise tolerance, and extremity edema.    Expected Outcomes Improve functional capacity of life;Short term: Attendance in program 2-3 days a week with increased exercise capacity. Reported lower sodium intake. Reported increased fruit and vegetable intake. Reports medication compliance.;Short term: Daily weights obtained and reported for increase. Utilizing diuretic protocols set by physician.;Long term: Adoption of self-care  skills and reduction of barriers for early signs and symptoms recognition and intervention leading to self-care maintenance.    Hypertension Yes    Intervention Provide education on lifestyle modifcations including regular physical activity/exercise, weight management, moderate sodium restriction and increased consumption of fresh fruit, vegetables, and low fat dairy, alcohol moderation, and smoking cessation.;Monitor prescription use compliance.    Expected Outcomes Short Term: Continued assessment and intervention until BP is < 140/31mm HG in hypertensive participants. < 130/22mm HG in hypertensive participants with diabetes, heart failure or chronic kidney disease.;Long Term: Maintenance of blood pressure at goal levels.    Lipids Yes    Intervention Provide education and support for participant on nutrition & aerobic/resistive exercise along with prescribed medications to achieve LDL 70mg , HDL >40mg .    Expected Outcomes Short Term: Participant states understanding of desired cholesterol values and is compliant with medications prescribed. Participant is following exercise prescription and nutrition guidelines.;Long Term: Cholesterol controlled with medications as prescribed, with individualized exercise RX and with personalized nutrition plan. Value goals: LDL < 70mg , HDL > 40 mg.             Core Components/Risk Factors/Patient Goals Review:   Goals and Risk Factor Review     Row Name 09/16/23 0831             Core Components/Risk Factors/Patient Goals Review   Personal Goals Review Weight Management/Obesity;Improve shortness of breath with ADL's       Review Clarence Dawson has been managing his weight by watching what he is eating. He has cut down on portions of meals. He has also cut out pastas and most breads other them his morning toast. He has also cut  out alcohol. He has noticed improvment in his breathing when doing activities in town. He uses PLB when he feels SOB.       Expected  Outcomes Short term: Continue to manage waright   long term: continue to exercise for happiness                Core Components/Risk Factors/Patient Goals at Discharge (Final Review):   Goals and Risk Factor Review - 09/16/23 0831       Core Components/Risk Factors/Patient Goals Review   Personal Goals Review Weight Management/Obesity;Improve shortness of breath with ADL's    Review Clarence Dawson has been managing his weight by watching what he is eating. He has cut down on portions of meals. He has also cut out pastas and most breads other them his morning toast. He has also cut out alcohol. He has noticed improvment in his breathing when doing activities in town. He uses PLB when he feels SOB.    Expected Outcomes Short term: Continue to manage waright   long term: continue to exercise for happiness             ITP Comments:  ITP Comments     Row Name 09/03/23 1011 09/04/23 0801 09/09/23 0750 10/07/23 0639     ITP Comments Patient attend orientation today.  Patient is attendingPulmonary Rehabilitation Program.  Documentation for diagnosis can be found in [facility] OV 09/01/23.  Reviewed medical chart, RPE/RPD, gym safety, and program guidelines.  Patient was fitted to equipment they will be using during rehab.  Patient is scheduled to start exercise on 09/04/23 at 745.   Initial ITP created and sent for review and signature by Dr. Erick Blinks, Medical Director for Pulmonary Rehabilitation Program. First full day of exercise!  Patient was oriented to gym and equipment including functions, settings, policies, and procedures.  Patient's individual exercise prescription and treatment plan were reviewed.  All starting workloads were established based on the results of the 6 minute walk test done at initial orientation visit.  The plan for exercise progression was also introduced and progression will be customized based on patient's performance and goals. 30 day review completed. ITP sent to Dr.Jehanzeb  Memon, Medical Director of  Pulmonary Rehab. Continue with ITP unless changes are made by physician.  New to program. 30 day review completed. ITP sent to Dr.Jehanzeb Memon, Medical Director of  Pulmonary Rehab. Continue with ITP unless changes are made by physician.             Comments: 30 day review

## 2023-10-07 NOTE — Progress Notes (Signed)
Daily Session Note  Patient Details  Name: Clarence Dawson MRN: [id number] Date of Birth: 11-Feb-1946 Referring Provider:   Flowsheet Row PULMONARY REHAB OTHER RESP ORIENTATION from 09/03/2023 in [facility] CARDIAC REHABILITATION  Referring Provider Marca Ancona MD       Encounter Date: 10/07/2023  Check In:  Session Check In - 10/07/23 0745       Check-In   Supervising physician immediately available to respond to emergencies See telemetry face sheet for immediately available ER MD    Location [facility]-Cardiac & Pulmonary Rehab    Staff Present Rodena Medin, RN, BSN;Jessica Juanetta Gosling, MA, RCEP, CCRP, CCET;Heather Fredric Mare, [place], Exercise Physiologist    Virtual Visit No    Medication changes reported     No    Fall or balance concerns reported    No    Warm-up and Cool-down Performed on first and last piece of equipment    Resistance Training Performed Yes    VAD Patient? No    PAD/SET Patient? No      Pain Assessment   Currently in Pain? No/denies    Pain Score 0-No pain    Multiple Pain Sites No             Capillary Blood Glucose: No results found for this or any previous visit (from the past 24 hour(s)).    Social History   Tobacco Use  Smoking Status Former   Current packs/day: 0.00   Average packs/day: 3.0 packs/day for 26.0 years (77.9 ttl pk-yrs)   Types: Cigarettes   Start date: 02/20/1958   Quit date: 02/12/1984   Years since quitting: 39.6  Smokeless Tobacco Former   Types: Chew  Tobacco Comments   Former smoker 08/30/2021    Goals Met:  Proper associated with RPD/PD & O2 Sat Independence with exercise equipment Using PLB without cueing & demonstrates good technique Exercise tolerated well No report of concerns or symptoms today Strength training completed today  Goals Unmet:  Not Applicable  Comments: Pt able to follow exercise prescription today without complaint.  Will continue to monitor for progression.    Dr. Dina Rich is Medical  Director for [facility] Cardiac Rehab

## 2023-10-09 ENCOUNTER — Encounter (HOSPITAL_COMMUNITY)
Admission: RE | Admit: 2023-10-09 | Discharge: 2023-10-09 | Disposition: A | Discharge status: Home / Self Care | Payer: Medicare Other | Source: Ambulatory Visit | Attending: Cardiology | Admitting: Cardiology

## 2023-10-09 DIAGNOSIS — I272 Pulmonary hypertension, unspecified: Secondary | ICD-10-CM | POA: Diagnosis not present

## 2023-10-09 NOTE — Progress Notes (Signed)
Daily Session Note  Patient Details  Name: Clarence Dawson MRN: [id number] Date of Birth: [birth year] Referring Provider:   Flowsheet Row PULMONARY REHAB OTHER RESP ORIENTATION from 09/03/2023 in [facility] CARDIAC REHABILITATION  Referring Provider Marca Ancona MD       Encounter Date: 10/09/2023  Check In:  Session Check In - 10/09/23 0745       Check-In   Supervising physician immediately available to respond to emergencies See telemetry face sheet for immediately available MD    Location [facility]-Cardiac & Pulmonary Rehab    Staff Present Ross Ludwig, BS, Exercise Physiologist    Staff Present Cyndia Diver, RN, BSN, MA    Virtual Visit No    Medication changes reported     No    Fall or balance concerns reported    No    Tobacco Cessation No Change    Warm-up and Cool-down Performed on first and last piece of equipment    Resistance Training Performed Yes    VAD Patient? No    PAD/SET Patient? No      Pain Assessment   Currently in Pain? No/denies    Pain Score 0-No pain    Multiple Pain Sites No             Capillary Blood Glucose: No results found for this or any previous visit (from the past 24 hour(s)).    Social History   Tobacco Use  Smoking Status Former   Current packs/day: 0.00   Average packs/day: 3.0 packs/day for 26.0 years (77.9 ttl pk-yrs)   Types: Cigarettes   Start date: 02/20/1958   Quit date: 02/12/1984   Years since quitting: 39.6  Smokeless Tobacco Former   Types: Chew  Tobacco Comments   Former smoker 08/30/2021    Goals Met:  Independence with exercise equipment Using PLB without cueing & demonstrates good technique Exercise tolerated well No report of concerns or symptoms today Strength training completed today  Goals Unmet:  Not Applicable  Comments: Pt able to follow exercise prescription today without complaint.  Will continue to monitor for progression.

## 2023-10-12 ENCOUNTER — Other Ambulatory Visit (HOSPITAL_COMMUNITY): Payer: Self-pay

## 2023-10-12 ENCOUNTER — Encounter (HOSPITAL_COMMUNITY)
Admission: RE | Admit: 2023-10-12 | Discharge: 2023-10-12 | Disposition: A | Discharge status: Home / Self Care | Payer: Medicare Other | Source: Ambulatory Visit | Attending: Cardiology | Admitting: Cardiology

## 2023-10-12 DIAGNOSIS — I272 Pulmonary hypertension, unspecified: Secondary | ICD-10-CM

## 2023-10-12 NOTE — Progress Notes (Signed)
Specialty Pharmacy Refill Coordination Note  Clarence Dawson is a 77 y.o. male contacted today regarding refills of specialty medication(s) Dupilumab   Patient requested Clarence Dawson at [facility] [place] date: 10/15/23   Medication will be filled on 10/14/23.

## 2023-10-12 NOTE — Progress Notes (Signed)
Daily Session Note  Patient Details  Name: HAEDEN HUDOCK MRN: [id number] Date of Birth: 11-10-1946 Referring Provider:   Flowsheet Row PULMONARY REHAB OTHER RESP ORIENTATION from 09/03/2023 in [facility] CARDIAC REHABILITATION  Referring Provider Marca Ancona MD       Encounter Date: 10/12/2023  Check In:  Session Check In - 10/12/23 0745       Check-In   Supervising physician immediately available to respond to emergencies See telemetry face sheet for immediately available MD    Location [facility]    Staff Present Ross Ludwig, BS, Exercise Physiologist;Jessica Juanetta Gosling, MA, RCEP, CCRP, CCET;Other    Virtual Visit No    Medication changes reported     No    Fall or balance concerns reported    No    Tobacco Cessation No Change    Warm-up and Cool-down Performed on first and last piece of equipment    Resistance Training Performed Yes    VAD Patient? No    PAD/SET Patient? No      Pain Assessment   Currently in Pain? No/denies    Pain Score 0-No pain    Multiple Pain Sites No             Capillary Blood Glucose: No results found for this or any previous visit (from the past 24 hour(s)).    Social History   Tobacco Use  Smoking Status Former   Current packs/day: 0.00   Average packs/day: 3.0 packs/day for 26.0 years (77.9 ttl pk-yrs)   Types: Cigarettes   Start date: 02/20/1958   Quit date: 02/12/1984   Years since quitting: 39.6  Smokeless Tobacco Former   Types: Chew  Tobacco Comments   Former smoker 08/30/2021    Goals Met:  Independence with exercise equipment Exercise tolerated well No report of concerns or symptoms today Strength training completed today  Goals Unmet:  Not Applicable  Comments: Pt able to follow exercise prescription today without complaint.  Will continue to monitor for progression.

## 2023-10-12 NOTE — Progress Notes (Signed)
Specialty Pharmacy Ongoing Clinical Assessment Note  Clarence Dawson is a 77 y.o. male who is being followed by the specialty pharmacy service for RxSp Asthma/COPD   Patient's specialty medication(s) reviewed today: Dupilumab   Missed doses in the last 4 weeks: 0   Patient/Caregiver did not have any additional questions or concerns.   Therapeutic benefit summary: Patient is achieving benefit   Adverse events/side effects summary: No adverse events/side effects   Patient's therapy is appropriate to: Continue    Goals Addressed             This Visit's Progress    Minimize recurrence of flares       Patient is on track. Patient will maintain adherence.  Clarence Dawson reports that he is well-controlled but does notice mild symptoms a day or two prior to his injection date.  He plans to discuss this with his provider at his upcoming office visit.          Follow up:  6 months  Servando Snare Specialty Pharmacist

## 2023-10-14 ENCOUNTER — Emergency Department (HOSPITAL_COMMUNITY): Payer: Medicare Other

## 2023-10-14 ENCOUNTER — Other Ambulatory Visit: Payer: Self-pay

## 2023-10-14 ENCOUNTER — Encounter (HOSPITAL_COMMUNITY): Payer: Medicare Other

## 2023-10-14 ENCOUNTER — Emergency Department (HOSPITAL_COMMUNITY)
Admission: EM | Admit: 2023-10-14 | Discharge: 2023-10-14 | Disposition: A | Discharge status: Home / Self Care | Payer: Medicare Other | Attending: Emergency Medicine | Admitting: Emergency Medicine

## 2023-10-14 ENCOUNTER — Encounter (HOSPITAL_COMMUNITY): Payer: Self-pay | Admitting: Emergency Medicine

## 2023-10-14 DIAGNOSIS — I10 Essential (primary) hypertension: Secondary | ICD-10-CM | POA: Diagnosis not present

## 2023-10-14 DIAGNOSIS — Z85828 Personal history of other malignant neoplasm of skin: Secondary | ICD-10-CM | POA: Insufficient documentation

## 2023-10-14 DIAGNOSIS — E119 Type 2 diabetes mellitus without complications: Secondary | ICD-10-CM | POA: Diagnosis not present

## 2023-10-14 DIAGNOSIS — Z79899 Other long term (current) drug therapy: Secondary | ICD-10-CM | POA: Insufficient documentation

## 2023-10-14 DIAGNOSIS — Z79890 Hormone replacement therapy (postmenopausal): Secondary | ICD-10-CM | POA: Insufficient documentation

## 2023-10-14 DIAGNOSIS — J449 Chronic obstructive pulmonary disease, unspecified: Secondary | ICD-10-CM | POA: Diagnosis not present

## 2023-10-14 DIAGNOSIS — E039 Hypothyroidism, unspecified: Secondary | ICD-10-CM | POA: Insufficient documentation

## 2023-10-14 DIAGNOSIS — Z950 Presence of cardiac pacemaker: Secondary | ICD-10-CM | POA: Insufficient documentation

## 2023-10-14 DIAGNOSIS — R519 Headache, unspecified: Secondary | ICD-10-CM | POA: Insufficient documentation

## 2023-10-14 DIAGNOSIS — Z7984 Long term (current) use of oral hypoglycemic drugs: Secondary | ICD-10-CM | POA: Diagnosis not present

## 2023-10-14 DIAGNOSIS — J0100 Acute maxillary sinusitis, unspecified: Secondary | ICD-10-CM | POA: Insufficient documentation

## 2023-10-14 LAB — BASIC METABOLIC PANEL
Anion gap: 7 (ref 5–15)
BUN: 19 mg/dL (ref 8–23)
CO2: 30 mmol/L (ref 22–32)
Calcium: 8.8 mg/dL — ABNORMAL LOW (ref 8.9–10.3)
Chloride: 100 mmol/L (ref 98–111)
Creatinine, Ser: 0.93 mg/dL (ref 0.61–1.24)
GFR, Estimated: >60 mL/min (ref >60)
Glucose, Bld: 133 mg/dL — ABNORMAL HIGH (ref 70–99)
Potassium: 4.8 mmol/L (ref 3.5–5.1)
Sodium: 137 mmol/L (ref 135–145)

## 2023-10-14 LAB — CBC WITH DIFFERENTIAL/PLATELET
Abs Immature Granulocytes: 0.02 10*3/uL (ref 0.00–0.07)
Basophils Absolute: 0 10*3/uL (ref 0.0–0.1)
Basophils Relative: 0 %
Eosinophils Absolute: 0.1 10*3/uL (ref 0.0–0.5)
Eosinophils Relative: 1 %
HCT: 46.4 % (ref 39.0–52.0)
Hemoglobin: 15.4 g/dL (ref 13.0–17.0)
Immature Granulocytes: 0 %
Lymphocytes Relative: 13 %
Lymphs Abs: 1 10*3/uL (ref 0.7–4.0)
MCH: 29.6 pg (ref 26.0–34.0)
MCHC: 33.2 g/dL (ref 30.0–36.0)
MCV: 89.1 fL (ref 80.0–100.0)
Monocytes Absolute: 0.7 10*3/uL (ref 0.1–1.0)
Monocytes Relative: 10 %
Neutro Abs: 5.5 10*3/uL (ref 1.7–7.7)
Neutrophils Relative %: 76 %
Platelets: 162 10*3/uL (ref 150–400)
RBC: 5.21 MIL/uL (ref 4.22–5.81)
RDW: 14.1 % (ref 11.5–15.5)
WBC: 7.3 10*3/uL (ref 4.0–10.5)
nRBC: 0 % (ref 0.0–0.2)

## 2023-10-14 MED ORDER — HYDROCODONE-ACETAMINOPHEN 5-325 MG PO TABS
2.0000 | ORAL_TABLET | Freq: Once | ORAL | Status: AC
  Administered 2023-10-14: 2 via ORAL
  Filled 2023-10-14: qty 2

## 2023-10-14 MED ORDER — OXYCODONE-ACETAMINOPHEN 5-325 MG PO TABS
ORAL_TABLET | ORAL | 0 refills | Status: DC
Start: 2023-10-14 — End: 2023-11-20

## 2023-10-14 MED ORDER — CLINDAMYCIN HCL 300 MG PO CAPS: 300.0000 mg | ORAL_CAPSULE | Freq: Three times a day (TID) | ORAL | 0 refills | Status: AC

## 2023-10-14 NOTE — Discharge Instructions (Signed)
Call Dr. Eliane Decree office at [phone] and make an appointment for next week.  Tell them that you were seen in the emergency department and that we spoke with the ENT doctor called Dr. Jearld Fenton and he wanted you to see Dr. Allena Katz next week.  Stop taking your Augmentin and start the new antibiotic today

## 2023-10-14 NOTE — ED Notes (Addendum)
Pt resting. States feels better. Aware awaiting ct scans. Nad. States pain is around right eye. Denies blurred vision

## 2023-10-14 NOTE — ED Triage Notes (Signed)
Pt reports he was diagnosed with a sinus infection by PCP and has been on abx, pt reports no relief but increased facial pain/pressure

## 2023-10-14 NOTE — ED Provider Notes (Signed)
[place] EMERGENCY DEPARTMENT AT [facility] Provider Note   CSN: [id number] Arrival date & time: 10/14/23  0865     History  Chief Complaint  Patient presents with   Facial Pain    Clarence Dawson is a 77 y.o. male.  The history is provided by the patient and a relative.  Patient with extensive history including COPD, chronic respiratory failure on 4 L of oxygen presents with facial pain.  Patient reports about 2 days ago he started having pain throughout the right side of his face. He reports it felt like a sinus infection.  Every time he bent over he felt pain and pressure in his head and eyes.  No fevers or vomiting.  He has a chronic cough due to his COPD.  He has had some nasal congestion.  He messaged his PCP and was given a prescription for Augmentin.  Over the past several hours he has had increasing pain in his face and forehead. No swelling, no rash.  No recent falls or trauma.  No new visual changes.  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   PAF (paroxysmal atrial fibrillation) (HCC)    Pneumonia    "several Times" 2015 last time   Primary localized osteoarthritis of right knee 08/11/2017   RBBB (right bundle branch block)    Sinoatrial node dysfunction (HCC)    Syncope    Tricuspid valve disorder    Type 2 diabetes mellitus (HCC)    Type II    Home Medications Prior to Admission medications   Medication Sig Start  Date End Date Taking? Authorizing Provider  acetaminophen (TYLENOL) 500 MG tablet Take 1,000 mg by mouth 3 (three) times daily.    [provider]  albuterol (PROVENTIL) (2.5 MG/3ML) 0.083% nebulizer solution TAKE 1 VIAL VIA NEBULIZATION EVERY 6 HOURS AS NEEDED FOR WHEEZING OR SHORTNESS OF BREATH. 07/03/23   Salena Saner, MD  albuterol (VENTOLIN HFA) 108 (90 Base) MCG/ACT inhaler Inhale 2 puffs into the lungs every 4 (four) hours as needed for shortness of breath (only if you can't catch your breath/ asthma). 05/19/23   Salena Saner, MD  atorvastatin (LIPITOR) 20 MG tablet Take 20 mg by mouth at bedtime. 04/27/20   [provider]  bisoprolol (ZEBETA) 5 MG tablet TAKE 1 AND 1/2 TABLET BY MOUTH TWICE DAILY. 04/17/17   Newman Nip, NP  BREZTRI AEROSPHERE 160-9-4.8 MCG/ACT AERO INHALE 2 PUFFS INTO THE LUNGS TWICE DAILY. 09/18/23   Salena Saner, MD  Carboxymethylcellul-Glycerin (LUBRICATING EYE DROPS OP) Place 1 drop into the right eye 2 (two) times a week. Clear eyes    [provider]  diltiazem (CARTIA XT) 120 MG 24 hr capsule TAKE (1) CAPSULE BY MOUTH DAILY, MAY TAKE A EXTRA CAPSULE DAILY FOR BREAKTHROUGH  AFIB. 12/11/22   Marinus Maw, MD  dofetilide (TIKOSYN) 500 MCG capsule Take 1 capsule (500 mcg total) by mouth 2 (two) times daily. 01/27/23   Fenton, Clint R, PA  Dupilumab (DUPIXENT) 300 MG/2ML SOPN Inject 300 mg into the skin every 14 (fourteen) days. 05/22/23   Salena Saner, MD  EPINEPHrine 0.3 mg/0.3 mL IJ SOAJ injection Inject 0.3 mg into the muscle as needed for anaphylaxis. 11/05/20   Salena Saner, MD  furosemide (LASIX) 40 MG tablet Take 1 tablet (40 mg total) by mouth in the morning AND 0.5 tablets (20 mg total) every evening. 08/10/23   Laurey Morale, MD  glipiZIDE (GLUCOTROL XL) 5 MG 24 hr tablet Take 5 mg by mouth daily before breakfast.    [provider]  hydrocortisone cream 0.5 % Apply 1 Application topically daily as  needed for itching.    [provider]  JARDIANCE 25 MG TABS tablet Take 25 mg by mouth daily before breakfast. 04/11/21   [provider]  levothyroxine (SYNTHROID) 150 MCG tablet Take 150 mcg by mouth daily before breakfast. 06/01/23   [provider]  losartan (COZAAR) 50 MG tablet Take 50 mg by mouth at bedtime. 04/27/20   [provider]  metFORMIN (GLUCOPHAGE-XR) 500 MG 24 hr tablet Take 500 mg by mouth in the morning and at bedtime.    [provider]  Multiple Vitamin (MULTIVITAMIN) tablet Take 1 tablet by mouth daily.    [provider]  omeprazole (PRILOSEC OTC) 20 MG tablet Take 20 mg by mouth every morning.    [provider]  PRESCRIPTION MEDICATION Thumper vest    [provider]  Respiratory Therapy Supplies (FLUTTER) DEVI Use as directed 10/10/19   Salena Saner, MD  rivaroxaban (XARELTO) 20 MG TABS tablet Take 20 mg by mouth at bedtime.    [provider]  sodium chloride (OCEAN) 0.65 % SOLN nasal spray Place 1 spray into both nostrils as needed for congestion.    [provider]      Allergies    Food, Iodinated contrast media, Shellfish allergy, Goat-derived products, Prednisone, Diclofenac sodium, Metformin and related, Vancomycin, and Voltaren [diclofenac sodium]    Review of Systems   Review of Systems  Constitutional:  Positive for chills. Negative for fever.  HENT:  Positive for congestion and ear pain.   Eyes:  Negative for visual disturbance.  Skin:  Negative for rash.    Physical Exam Updated Vital Signs BP (!) 164/70   Pulse 61   Temp 99.1 F (37.3 C)   Resp 19   Ht 1.765 m (5' 9.5")   Wt 99.8 kg   SpO2 90%   BMI 32.02 kg/m  Physical Exam CONSTITUTIONAL: Chronically ill-appearing, no acute distress HEAD: Normocephalic/atraumatic, there is no rash noted to his forehead or periorbital region EYES: EOMI/PERRL, no conjunctival injection, no corneal haziness ENMT:  Mucous membranes moist There is mild nasal congestion Patient is edentulous, there is no gingival abscess or swelling No angioedema, no stridor or drooling Mild tenderness is noted over the right maxilla, but no crepitus, no bogginess Bilateral TMs clear and intact There is no rash noted to his face NECK: supple no meningeal signs, no anterior cervical lymphadenopathy LUNGS: Lungs are clear to auscultation bilaterally, no apparent distress NEURO: Pt is awake/alert/appropriate, moves all extremitiesx4.  No facial droop.   SKIN: warm, color normal  ED Results / Procedures / Treatments   Labs (all labs ordered are  listed, but only abnormal results are displayed) Labs Reviewed - No data to display  EKG None  Radiology No results found.  Procedures Procedures    Medications Ordered in ED Medications  HYDROcodone-acetaminophen (NORCO/VICODIN) 5-325 MG per tablet 2 tablet (2 tablets Oral Given 10/14/23 0704)    ED Course/ Medical Decision Making/ A&P Clinical Course as of 10/14/23 0709  Wed Oct 14, 2023  3086 Patient reports feeling he had a sinus infection starting about 2 days ago.  He has been started on Augmentin but now has worsening pain mostly on the right side of his face.  There is no rash to suggest abscess or cellulitis, no signs of zoster.  He has no signs of any intraoral abscess or infection  he does have tenderness in his face that could be consistent with sinusitis [DW]  0654 Since patient is chronically ill at baseline with severe pain, will proceed with CT imaging [DW]  0708 Signed out to dr zammit at shift change to f/u on CT imaging [DW]    Clinical Course User Index [DW] Zadie Rhine, MD                                 Medical Decision Making Amount and/or Complexity of Data Reviewed Radiology: ordered.  Risk Prescription drug management.           Final Clinical Impression(s) / ED Diagnoses Final diagnoses:  Facial pain  Acute  non-recurrent maxillary sinusitis    Rx / DC Orders ED Discharge Orders     None         Zadie Rhine, MD 10/14/23 [phone]

## 2023-10-14 NOTE — ED Provider Notes (Signed)
Patient has sinusitis.  I spoke to Dr. Jearld Fenton and had him review the films.  He does not feel like the patient needs an MRI.  We will change his antibiotic to clindamycin and stop the Augmentin.  The patient will be seen at the ENT office by Dr. Allena Katz next week   Bethann Berkshire, MD 10/14/23 0930

## 2023-10-16 ENCOUNTER — Other Ambulatory Visit (HOSPITAL_COMMUNITY): Payer: Self-pay

## 2023-10-16 ENCOUNTER — Encounter (HOSPITAL_COMMUNITY): Payer: Medicare Other

## 2023-10-19 ENCOUNTER — Encounter (HOSPITAL_COMMUNITY): Payer: Medicare Other

## 2023-10-20 ENCOUNTER — Ambulatory Visit: Payer: Medicare Other | Attending: Pulmonary Disease

## 2023-10-20 DIAGNOSIS — Z87891 Personal history of nicotine dependence: Secondary | ICD-10-CM | POA: Diagnosis not present

## 2023-10-20 DIAGNOSIS — J439 Emphysema, unspecified: Secondary | ICD-10-CM | POA: Insufficient documentation

## 2023-10-20 DIAGNOSIS — I272 Pulmonary hypertension, unspecified: Secondary | ICD-10-CM | POA: Insufficient documentation

## 2023-10-20 DIAGNOSIS — J449 Chronic obstructive pulmonary disease, unspecified: Secondary | ICD-10-CM | POA: Diagnosis present

## 2023-10-20 LAB — PULMONARY FUNCTION TEST ARMC ONLY
DL/VA % pred: 39 %
DL/VA: 1.54 ml/min/mmHg/L
DLCO unc % pred: 36 %
DLCO unc: 8.89 ml/min/mmHg
FEF 25-75 Post: 1 L/s
FEF 25-75 Pre: 0.97 L/s
FEF2575-%Change-Post: 3 %
FEF2575-%Pred-Post: 49 %
FEF2575-%Pred-Pre: 47 %
FEV1-%Change-Post: -1 %
FEV1-%Pred-Post: 67 %
FEV1-%Pred-Pre: 69 %
FEV1-Post: 1.94 L
FEV1-Pre: 1.98 L
FEV1FVC-%Change-Post: -1 %
FEV1FVC-%Pred-Pre: 71 %
FEV6-%Change-Post: 0 %
FEV6-%Pred-Post: 98 %
FEV6-%Pred-Pre: 98 %
FEV6-Post: 3.69 L
FEV6-Pre: 3.67 L
FEV6FVC-%Change-Post: -1 %
FEV6FVC-%Pred-Post: 103 %
FEV6FVC-%Pred-Pre: 104 %
FVC-%Change-Post: 0 %
FVC-%Pred-Post: 95 %
FVC-%Pred-Pre: 95 %
FVC-Post: 3.83 L
FVC-Pre: 3.82 L
Post FEV1/FVC ratio: 51 %
Post FEV6/FVC ratio: 96 %
Pre FEV1/FVC ratio: 52 %
Pre FEV6/FVC Ratio: 98 %
RV % pred: 168 %
RV: 4.27 L
TLC % pred: 122 %
TLC: 8.37 L

## 2023-10-20 MED ORDER — ALBUTEROL SULFATE (2.5 MG/3ML) 0.083% IN NEBU
2.5000 mg | INHALATION_SOLUTION | Freq: Once | RESPIRATORY_TRACT | Status: AC
  Administered 2023-10-20: 2.5 mg via RESPIRATORY_TRACT
  Filled 2023-10-20: qty 3

## 2023-10-21 ENCOUNTER — Encounter (HOSPITAL_COMMUNITY)
Admission: RE | Admit: 2023-10-21 | Discharge: 2023-10-21 | Disposition: A | Discharge status: Home / Self Care | Payer: Medicare Other | Source: Ambulatory Visit | Attending: Cardiology

## 2023-10-21 ENCOUNTER — Encounter (INDEPENDENT_AMBULATORY_CARE_PROVIDER_SITE_OTHER): Payer: Self-pay

## 2023-10-21 ENCOUNTER — Ambulatory Visit (INDEPENDENT_AMBULATORY_CARE_PROVIDER_SITE_OTHER): Payer: Medicare Other | Admitting: Otolaryngology

## 2023-10-21 VITALS — Ht 69.0 in | Wt 221.0 lb

## 2023-10-21 DIAGNOSIS — J343 Hypertrophy of nasal turbinates: Secondary | ICD-10-CM | POA: Diagnosis not present

## 2023-10-21 DIAGNOSIS — I272 Pulmonary hypertension, unspecified: Secondary | ICD-10-CM

## 2023-10-21 DIAGNOSIS — J329 Chronic sinusitis, unspecified: Secondary | ICD-10-CM

## 2023-10-21 DIAGNOSIS — J324 Chronic pansinusitis: Secondary | ICD-10-CM

## 2023-10-21 DIAGNOSIS — J342 Deviated nasal septum: Secondary | ICD-10-CM | POA: Diagnosis not present

## 2023-10-21 MED ORDER — FLUTICASONE PROPIONATE 50 MCG/ACT NA SUSP: 2.0000 | Freq: Every day | NASAL | 6 refills | Status: DC

## 2023-10-21 MED ORDER — PREDNISONE 10 MG PO TABS: 10.0000 mg | ORAL_TABLET | Freq: Every day | ORAL | 0 refills | Status: DC

## 2023-10-21 NOTE — Patient Instructions (Addendum)
-   Take Prednisone by mouth (PO) 10mg  x 5 days (1 pill in morning), then 5mg  x5 days (1/2 pill), then stop - Take flonase - 2 puffs each nostril once daily - Lloyd Huger Med Nasal Saline Rinse  I have ordered a CT scan for your sinuses; for you to complete prior to your next visit. Please call [facility] Scheduling at [phone] to schedule your imaging if you have not received a call within 24 hours. If you are unable to complete your imaging study prior to your next scheduled visit please call our office to let us know.    - start nasal saline rinses with NeilMed Bottle available over the counter    Nasal Saline Irrigation instructions: If you choose to make your own salt water solution, You will need: Salt (kosher, canning, or pickling salt) Baking soda Nasal irrigation bottle (i.e. Lloyd Huger Med Sinus Rinse) Measuring spoon ( teaspoon) Distilled / boiled water   Mix solution Mix 1 teaspoon of salt, 1/2 teaspoon of baking soda and 1 cup of water into irrigation bottle ** May use saline packet instead of homemade recipe for this step if you prefer If medicine was prescribed to be mixed with solution, place this into bottle Examples 2 inches of 2% mupirocin ointment Budesonide solution Position your head: Lean over sink (about 45 degrees) Rotate head (about 45 degrees) so that one nostril is above the other Irrigate Insert tip of irrigation bottle into upper nostril so it forms a comfortable seal Irrigate while breathing through your mouth May remove the straw from the bottle in order to irrigate the entire solution (important if medicine was added) Exhale through nose when finished and blow nose as necessary  Repeat on opposite side with other 1/2 of solution (120 mL) or remake solution if all 240 mL was used on first side Wash irrigation bottle regularly, replace every 3 months

## 2023-10-21 NOTE — Progress Notes (Signed)
Daily Session Note  Patient Details  Name: Clarence Dawson MRN: [id number] Date of Birth: 1946-01-30 Referring Provider:   Flowsheet Row PULMONARY REHAB OTHER RESP ORIENTATION from 09/03/2023 in [facility] CARDIAC REHABILITATION  Referring Provider Marca Ancona MD       Encounter Date: 10/21/2023  Check In:  Session Check In - 10/21/23 0804       Check-In   Supervising physician immediately available to respond to emergencies See telemetry face sheet for immediately available MD    Location [facility]    Staff Present Ross Ludwig, BS, Exercise Physiologist;Hillary Metcalf BSN, RN;Enijah Furr [place], MA, RCEP, CCRP, CCET    Virtual Visit No    Medication changes reported     Yes    Comments added antibiotic for 14 day course    Fall or balance concerns reported    No    Warm-up and Cool-down Performed on first and last piece of equipment    Resistance Training Performed Yes    VAD Patient? No    PAD/SET Patient? No      Pain Assessment   Currently in Pain? No/denies             Capillary Blood Glucose: No results found for this or any previous visit (from the past 24 hour(s)).    Social History   Tobacco Use  Smoking Status Former   Current packs/day: 0.00   Average packs/day: 3.0 packs/day for 26.0 years (77.9 ttl pk-yrs)   Types: Cigarettes   Start date: 02/20/1958   Quit date: 02/12/1984   Years since quitting: 39.7  Smokeless Tobacco Former   Types: Chew  Tobacco Comments   Former smoker 08/30/2021    Goals Met:  Proper associated with RPD/PD & O2 Sat Independence with exercise equipment Using PLB without cueing & demonstrates good technique Exercise tolerated well Personal goals reviewed No report of concerns or symptoms today Strength training completed today  Goals Unmet:  Not Applicable  Comments: Pt able to follow exercise prescription today without complaint.  Will continue to monitor for progression.

## 2023-10-21 NOTE — Progress Notes (Signed)
Dear Dr. Ouida Sills, Here is my assessment for our mutual patient, Clarence Dawson. Thank you for allowing me the opportunity to care for your patient. Please do not hesitate to contact me should you have any other questions. Sincerely, Dr. Jovita Kussmaul  Otolaryngology Clinic Note Referring provider: Dr. Ouida Sills HPI:  Clarence Dawson is a 77 y.o. male kindly referred by Dr. Ouida Sills for evaluation of sinusitis.  He reports that he started to have some acute onset RIGHT facial pressure/pain on 10/14. He contacted his PCP but he got worse, and he really had significant amount of worsening on 10/16 with facial swelling and then we went to the ED. He was having right facial pain, pressure, and pain. Some headache and left facial pain as well. No fevers. No anterior rhinorrhea, no post nasal drip. Normal sense of smell. No nasal congestion. No facial swelling or numbness. 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  No vomiting.   He was prescribed Clindamycin 300 TID x2 weeks, and CT was done. He reports today that he is doing "a lot better" - almost back to normal. He says the pain and discomfort is much improved, some crackling sensation but he does report some maxillary sinus pressure and head feels a "bit heavy". No tearing or dry eye symptoms or vision changes or facial numbness.  He reports that he gets a sinus infection about once a year with similar symptoms, and he gets antibiotics which improve his symptoms. He has never had anything as severe as he had just now.  No itchy eyes, runny nose, or typical AR symptoms but he is on dupixent. Does have asthma  He does use intermittent nasal rinses. Does not use flonase. Does not use Antihistamine - can't tolerate. He is on xarelto.  H&N Surgery: no Personal or FHx of bleeding dz or anesthesia difficulty: no  On 4L O2 at baseline He does have DM - metformin. Never had anything over 150. No AI disease, no lymphoma. Besides dupixent, no  DMARDs  PMHx: extensive - including paroxysmal A-fib, COPD on 4L, Bronchiectasis, CHF, Pulmonary HTN on BIPAP at night, Sick sinus with a PPM  Independent Review of Additional Tests or Records:  CT Face 10/16: independently reviewed, right nasal septal deviation, right sided pansinus opacification with some L ethmoid and sphenoid disease. Clearly some stranding over anterior and lateral buttress, but retroantral fat pad appears symmetric (though no contrast). Query intersinus septum of sphenoid erosion - but not clear since prior CT Head also may have had a dehiscence. R nasolacrimal with some asymmetry, but no erosion. No dual density to suggest fungal etiology. Official read queries fungal sinusitis but it does not appear significantly likely     Prior CT Head Nov 2020: clear sinuses, do still think intersinus sphenoid septum may have a dehiscence but cuts thick (5mm)    Labs: CBC 10/14/23: WBC 7.3 ANC 5.5K/uL BMP 10/14/2023: Glc 133  PMH/Meds/All/SocHx/FamHx/ROS:   Past Medical History:  Diagnosis Date   Allergic rhinitis    Aortic valve disorder    Asthma    since childhood- seasonal allergies induced   Cancer (HCC)    Skin cancer- squamous, basal   Carotid artery stenosis    Essential hypertension    Full dentures    GERD (gastroesophageal reflux disease)    H/O hiatal hernia    Hemorrhage of rectum    Hyperlipidemia    Hypothyroidism    Male circumcision    OSA (obstructive sleep  apnea)    Osteoarthritis    Pacemaker    Oct 2005 in [place].   PAF (paroxysmal atrial fibrillation) (HCC)    Pneumonia    "several Times" 2015 last time   Primary localized osteoarthritis of right knee 08/11/2017   RBBB (right bundle branch block)    Sinoatrial node dysfunction (HCC)    Syncope    Tricuspid valve disorder    Type 2 diabetes mellitus (HCC)    Type II     Past Surgical History:  Procedure Laterality Date   A-V CARDIAC PACEMAKER INSERTION     Sick sinus syndrome DDR  pacer   Arthropathy Right 2005   Rebuilding of left thumb and joint    CARDIAC CATHETERIZATION     CARDIAC ELECTROPHYSIOLOGY STUDY AND ABLATION  09/2008   for pvcs, Dr. Vesta Mixer   CARDIOVERSION N/A 12/18/2016   Procedure: CARDIOVERSION;  Surgeon: Chrystie Nose, MD;  Location: [facility] ENDOSCOPY;  Service: Cardiovascular;  Laterality: N/A;   CARDIOVERSION N/A 09/05/2021   Procedure: CARDIOVERSION;  Surgeon: Sande Rives, MD;  Location: [facility] ENDOSCOPY;  Service: Cardiovascular;  Laterality: N/A;   CARDIOVERSION N/A 08/06/2022   Procedure: CARDIOVERSION;  Surgeon: Little Ishikawa, MD;  Location: [facility] ENDOSCOPY;  Service: Cardiovascular;  Laterality: N/A;   CARPAL TUNNEL RELEASE  1994   right wrist   CARPAL TUNNEL RELEASE  05/04/2012   Procedure: CARPAL TUNNEL RELEASE;  Surgeon: Nicki Reaper, MD;  Location: [facility];  Service: Orthopedics;  Laterality: Left;   CARPOMETACARPEL SUSPENSION PLASTY Right 11/16/2014   Procedure: SUSPENSIONPLASTY RIGHT THUMB TENDON TRANSFER ABDUCTOR POLLICUS LONGUS EXCISION TRAPEZIUM;  Surgeon: Cindee Salt, MD;  Location: [facility];  Service: Orthopedics;  Laterality: Right;   CHOLECYSTECTOMY  1994   CIRCUMCISION     COLONOSCOPY N/A 03/14/2013   Procedure: COLONOSCOPY;  Surgeon: Corbin Ade, MD;  Location: [facility];  Service: Endoscopy;  Laterality: N/A;  8:15 AM   EYE SURGERY     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-[facility]   EYE SURGERY  2012   Left eye Corneal transplant- partial- Cataract   FLEXIBLE BRONCHOSCOPY Right 06/23/2019   Procedure: FLEXIBLE BRONCHOSCOPY RIGHT;  Surgeon: Shane Crutch, MD;  Location: [facility] ORS;  Service: Pulmonary;  Laterality: Right;   GALLBLADDER SURGERY  12/01/2006   HAND TENDON SURGERY Left late 1990's   thumb   HEMORROIDECTOMY  2003   INJECTION KNEE Left 08/26/2017   Procedure: LEFT KNEE INJECTION;  Surgeon: Salvatore Marvel, MD;  Location: [facility];  Service: Orthopedics;  Laterality:  Left;   left Knee Arthroscopy     [facility]   PARTIAL KNEE ARTHROPLASTY  11/22/2012   Procedure: UNICOMPARTMENTAL KNEE;  Surgeon: Nilda Simmer, MD;  Location: [facility] OR;  Service: Orthopedics;  Laterality: Left;  left unicompartmental knee arthroplasty   PERMANENT PACEMAKER GENERATOR CHANGE N/A 01/12/2013   Procedure: PERMANENT PACEMAKER GENERATOR CHANGE;  Surgeon: Marinus Maw, MD;  Location: [facility] CATH LAB;  Service: Cardiovascular;  Laterality: N/A;   PPM GENERATOR CHANGEOUT N/A 07/07/2022   Procedure: PPM GENERATOR CHANGEOUT;  Surgeon: Marinus Maw, MD;  Location: [facility] INVASIVE CV LAB;  Service: Cardiovascular;  Laterality: N/A;   RIGHT/LEFT HEART CATH AND CORONARY ANGIOGRAPHY N/A 08/28/2023   Procedure: RIGHT/LEFT HEART CATH AND CORONARY ANGIOGRAPHY;  Surgeon: Laurey Morale, MD;  Location: [facility] INVASIVE CV LAB;  Service: Cardiovascular;  Laterality: N/A;   Rotator cuff Surgery  2001   Right shoulder  TONSILLECTOMY     TOTAL KNEE ARTHROPLASTY Right 08/26/2017   Procedure: TOTAL KNEE ARTHROPLASTY;  Surgeon: Salvatore Marvel, MD;  Location: [facility] OR;  Service: Orthopedics;  Laterality: Right;   varicose vein reduction      Family History  Problem Relation Age of Onset   Liver cancer Mother    Pancreatic cancer Mother    Colon cancer Mother    Hypertension Father    Stroke Father    Other Father 58       Sudden Cardiac death   Heart attack Father    Cancer Sister        brain   Diabetes Sister    Colon cancer Maternal Aunt    Colon polyps Neg Hx      Social Connections: Not on file     Current Outpatient Medications:    acetaminophen (TYLENOL) 500 MG tablet, Take 1,000 mg by mouth 3 (three) times daily., Disp: , Rfl:    albuterol (PROVENTIL) (2.5 MG/3ML) 0.083% nebulizer solution, TAKE 1 VIAL VIA NEBULIZATION EVERY 6 HOURS AS NEEDED FOR WHEEZING OR SHORTNESS OF BREATH., Disp: 90 mL, Rfl: 2   albuterol (VENTOLIN HFA) 108 (90 Base) MCG/ACT inhaler, Inhale 2  puffs into the lungs every 4 (four) hours as needed for shortness of breath (only if you can't catch your breath/ asthma)., Disp: 18 g, Rfl: 5   atorvastatin (LIPITOR) 20 MG tablet, Take 20 mg by mouth at bedtime., Disp: , Rfl:    bisoprolol (ZEBETA) 5 MG tablet, TAKE 1 AND 1/2 TABLET BY MOUTH TWICE DAILY., Disp: 90 tablet, Rfl: 3   BREZTRI AEROSPHERE 160-9-4.8 MCG/ACT AERO, INHALE 2 PUFFS INTO THE LUNGS TWICE DAILY., Disp: 10.7 g, Rfl: 5   Carboxymethylcellul-Glycerin (LUBRICATING EYE DROPS OP), Place 1 drop into the right eye 2 (two) times a week. Clear eyes, Disp: , Rfl:    diltiazem (CARTIA XT) 120 MG 24 hr capsule, TAKE (1) CAPSULE BY MOUTH DAILY, MAY TAKE A EXTRA CAPSULE DAILY FOR BREAKTHROUGH AFIB., Disp: 180 capsule, Rfl: 3   dofetilide (TIKOSYN) 500 MCG capsule, Take 1 capsule (500 mcg total) by mouth 2 (two) times daily., Disp: 180 capsule, Rfl: 3   Dupilumab (DUPIXENT) 300 MG/2ML SOPN, Inject 300 mg into the skin every 14 (fourteen) days., Disp: 12 mL, Rfl: 1   EPINEPHrine 0.3 mg/0.3 mL IJ SOAJ injection, Inject 0.3 mg into the muscle as needed for anaphylaxis., Disp: 1 each, Rfl: 5   furosemide (LASIX) 40 MG tablet, Take 1 tablet (40 mg total) by mouth in the morning AND 0.5 tablets (20 mg total) every evening., Disp: 90 tablet, Rfl: 3   glipiZIDE (GLUCOTROL XL) 5 MG 24 hr tablet, Take 5 mg by mouth daily before breakfast., Disp: , Rfl:    hydrocortisone cream 0.5 %, Apply 1 Application topically daily as needed for itching., Disp: , Rfl:    JARDIANCE 25 MG TABS tablet, Take 25 mg by mouth daily before breakfast., Disp: , Rfl:    levothyroxine (SYNTHROID) 150 MCG tablet, Take 150 mcg by mouth daily before breakfast., Disp: , Rfl:    losartan (COZAAR) 50 MG tablet, Take 50 mg by mouth at bedtime., Disp: , Rfl:    metFORMIN (GLUCOPHAGE-XR) 500 MG 24 hr tablet, Take 500 mg by mouth in the morning and at bedtime., Disp: , Rfl:    Multiple Vitamin (MULTIVITAMIN) tablet, Take 1 tablet by mouth  daily., Disp: , Rfl:    omeprazole (PRILOSEC OTC) 20 MG tablet, Take 20 mg by  mouth every morning., Disp: , Rfl:    oxyCODONE-acetaminophen (PERCOCET/ROXICET) 5-325 MG tablet, Take 1 every 6 hours for pain that is not helped by Tylenol or Motrin, Disp: 15 tablet, Rfl: 0   PRESCRIPTION MEDICATION, Thumper vest, Disp: , Rfl:    Respiratory Therapy Supplies (FLUTTER) DEVI, Use as directed, Disp: 1 each, Rfl: 0   rivaroxaban (XARELTO) 20 MG TABS tablet, Take 20 mg by mouth at bedtime., Disp: , Rfl:    sodium chloride (OCEAN) 0.65 % SOLN nasal spray, Place 1 spray into both nostrils as needed for congestion., Disp: , Rfl:    clindamycin (CLEOCIN) 300 MG capsule, Take 1 capsule (300 mg total) by mouth 3 (three) times daily for 14 days., Disp: 42 capsule, Rfl: 0   Physical Exam:   Ht 5\' 9"  (1.753 m)   Wt 221 lb (100.2 kg)   BMI 32.64 kg/m    Salient findings:  CN II-XII intact; no palatal numbness or discoloration; clearly sensate over V1-3; no facial swelling or erythema, EOM intact; no epiphora  Bilateral EAC clear and TM intact with well pneumatized middle ear spaces Anterior rhinoscopy: Septum deviates right; bilateral inferior turbinates with modest hypertrophy; nasal endoscopy done given concern for fungal sinusitis No lesions of oral cavity/oropharynx; edentulous maxilla; no palatal discoloration or numbness No obviously palpable neck masses/lymphadenopathy/thyromegaly No respiratory distress or stridor  Procedures:  PROCEDURE: Bilateral Diagnostic Rigid Nasal Endoscopy Pre-procedure diagnosis: Sinusitis Post-procedure diagnosis: same Indication: See pre-procedure diagnosis and physical exam above Complications: None apparent EBL: 0 mL Anesthesia: No anesthetic or decongestant administered for examination  Description of Procedure:  Patient was identified. A rigid 0 and 30 degree endoscope was utilized to evaluate the sinonasal cavities, mucosa, sinus ostia and turbinates and septum.   Overall, signs of mucosal inflammation are noted.  No mucopurulence, polyps, or masses noted. Inferior and middle Turbinates and the nasal mucosa are overall pink, without significant pallor, and sensate to examination. No septal lesions. Also examined the inferior meatus area on both sides and there are no necrotic or fungal elements. Along the maxillary line on both sides, there is no significant obvious mucosal irregularity. No obvious evidence on endoscopy indicative of invasive fungal infection. Right Middle meatus: clear Right SE Recess: clear Left MM: clear Left SE Recess: clear I.e. no purulence       Photodocumentation was obtained.  CPT CODE -- 81191 - Mod 25   Impression & Plans:  Clarence Dawson is a 77 y.o. male with significant co-morbidities and on dupixent who presents for: Chronic sinusitis (right > left) - CT read reports concern for IFS but given significant improvement, endoscopy today, and WBC prior and no CN deficits, do not think this is IFS but rather an episode of acute on chronic sinusitis.  Nasal septal deviation Bilateral inferior turbinate hypertrophy He does have some intersphenoid sinus septum dehiscence (erosion?) and the lacrimal area is somewhat asymmetric on scan. Therefore, although improving, given his co-morbidities and severity of infection, will get post treatment CT to ensure resolution. He does report having at least 1-2 infections/year and given his pulm status, would like to treat his sinuses to ensure that does not exacerbate his pulm pathology. - Finish antibiotics prescribed - Will start him on low dose Pred - 10mg  PO daily x5d, and then 5mg  PO daily x5 days, then stop (does report sensitivity to steroids given his polypharmacy) - Flonase 50 mcg 2 puffs BID x30d - Daily Neil Med sinus irrigations - CT Brainlab in 4 weeks -  f/u in 4-6 weeks - If persistent, can consider opening sinuses though he is not a great candidate given his  co-morbidities. He may be potentially a candidate for max balloon sinuplasty or sphenoid since those are his major opacified areas if no improvement.  MDM:  Level 4: 99204 Complexity/Problems addressed: mod - unk prognosis Data complexity: mod - Morbidity: mod - Prescription Drug prescribed or managed: yes    Thank you for allowing me the opportunity to care for your patient. Please do not hesitate to contact me should you have any other questions.  Sincerely, Jovita Kussmaul, MD Otolarynoglogist (ENT), [facility] ENT Specialist Phone: [phone] Fax: [phone]  10/21/2023, 10:46 AM

## 2023-10-23 ENCOUNTER — Encounter (HOSPITAL_COMMUNITY)
Admission: RE | Admit: 2023-10-23 | Discharge: 2023-10-23 | Disposition: A | Discharge status: Home / Self Care | Payer: Medicare Other | Source: Ambulatory Visit | Attending: Cardiology

## 2023-10-23 DIAGNOSIS — I272 Pulmonary hypertension, unspecified: Secondary | ICD-10-CM

## 2023-10-23 NOTE — Progress Notes (Signed)
Daily Session Note  Patient Details  Name: Clarence Dawson MRN: [id number] Date of Birth: [birth year] Referring Provider:   Flowsheet Row PULMONARY REHAB OTHER RESP ORIENTATION from 09/03/2023 in [facility] CARDIAC REHABILITATION  Referring Provider Marca Ancona MD       Encounter Date: 10/23/2023  Check In:  Session Check In - 10/23/23 0745       Check-In   Supervising physician immediately available to respond to emergencies See telemetry face sheet for immediately available MD    Location [facility]    Staff Present [facility] BSN, RN;Jessica [place], [place], RCEP, CCRP, CCET    Virtual Visit No    Medication changes reported     Yes    Comments Pt is taking prednisone, flonase, and using a salt flush (10/23/23)    Fall or balance concerns reported    No    Tobacco Cessation No Change    Warm-up and Cool-down Performed on first and last piece of equipment    Resistance Training Performed Yes    VAD Patient? No    PAD/SET Patient? No      Pain Assessment   Currently in Pain? No/denies    Pain Score 0-No pain    Multiple Pain Sites No             Capillary Blood Glucose: No results found for this or any previous visit (from the past 24 hour(s)).    Social History   Tobacco Use  Smoking Status Former   Current packs/day: 0.00   Average packs/day: 3.0 packs/day for 26.0 years (77.9 ttl pk-yrs)   Types: Cigarettes   Start date: 02/20/1958   Quit date: 02/12/1984   Years since quitting: 39.7  Smokeless Tobacco Former   Types: Chew  Tobacco Comments   Former smoker 08/30/2021    Goals Met:  Proper associated with RPD/PD & O2 Sat Independence with exercise equipment Using PLB without cueing & demonstrates good technique Exercise tolerated well Queuing for purse lip breathing No report of concerns or symptoms today Strength training completed today  Goals Unmet:  Not Applicable  Comments: Marland KitchenMarland KitchenPt able to follow exercise prescription  today without complaint.  Will continue to monitor for progression.

## 2023-10-26 ENCOUNTER — Ambulatory Visit: Payer: Medicare Other | Admitting: Pulmonary Disease

## 2023-10-26 ENCOUNTER — Encounter: Payer: Self-pay | Admitting: Pulmonary Disease

## 2023-10-26 ENCOUNTER — Encounter (HOSPITAL_COMMUNITY)
Admission: RE | Admit: 2023-10-26 | Discharge: 2023-10-26 | Disposition: A | Discharge status: Home / Self Care | Payer: Medicare Other | Source: Ambulatory Visit | Attending: Cardiology | Admitting: Cardiology

## 2023-10-26 VITALS — BP 142/70 | HR 84 | Temp 98.6°F | Ht 69.0 in | Wt 228.4 lb

## 2023-10-26 DIAGNOSIS — I272 Pulmonary hypertension, unspecified: Secondary | ICD-10-CM | POA: Diagnosis not present

## 2023-10-26 DIAGNOSIS — J4489 Other specified chronic obstructive pulmonary disease: Secondary | ICD-10-CM | POA: Diagnosis not present

## 2023-10-26 DIAGNOSIS — G4733 Obstructive sleep apnea (adult) (pediatric): Secondary | ICD-10-CM

## 2023-10-26 DIAGNOSIS — J479 Bronchiectasis, uncomplicated: Secondary | ICD-10-CM | POA: Diagnosis not present

## 2023-10-26 DIAGNOSIS — J9611 Chronic respiratory failure with hypoxia: Secondary | ICD-10-CM

## 2023-10-26 DIAGNOSIS — J324 Chronic pansinusitis: Secondary | ICD-10-CM

## 2023-10-26 MED ORDER — OHTUVAYRE 3 MG/2.5ML IN SUSP
3.0000 mg | Freq: Two times a day (BID) | RESPIRATORY_TRACT | Status: AC
Start: 2023-10-26 — End: ?

## 2023-10-26 NOTE — Progress Notes (Signed)
Daily Session Note  Patient Details  Name: Clarence Dawson MRN: [id number] Date of Birth: 1946-01-07 Referring Provider:   Flowsheet Row PULMONARY REHAB OTHER RESP ORIENTATION from 09/03/2023 in [facility] CARDIAC REHABILITATION  Referring Provider Marca Ancona MD       Encounter Date: 10/26/2023  Check In:  Session Check In - 10/26/23 0758       Check-In   Supervising physician immediately available to respond to emergencies See telemetry face sheet for immediately available MD    Location [facility]-Cardiac & Pulmonary Rehab    Staff Present Fabio Pierce, MA, RCEP, CCRP, CCET    Staff Present Cyndia Diver, RN, BSN, MA    Virtual Visit No    Medication changes reported     No    Fall or balance concerns reported    No    Warm-up and Cool-down Performed on first and last piece of equipment    Resistance Training Performed Yes    VAD Patient? No    PAD/SET Patient? No      Pain Assessment   Currently in Pain? No/denies             Capillary Blood Glucose: No results found for this or any previous visit (from the past 24 hour(s)).    Social History   Tobacco Use  Smoking Status Former   Current packs/day: 0.00   Average packs/day: 3.0 packs/day for 26.0 years (77.9 ttl pk-yrs)   Types: Cigarettes   Start date: 02/20/1958   Quit date: 02/12/1984   Years since quitting: 39.7  Smokeless Tobacco Former   Types: Chew  Tobacco Comments   Former smoker 08/30/2021    Goals Met:  Proper associated with RPD/PD & O2 Sat Independence with exercise equipment Using PLB without cueing & demonstrates good technique Exercise tolerated well No report of concerns or symptoms today Strength training completed today  Goals Unmet:  Not Applicable  Comments: Pt able to follow exercise prescription today without complaint.  Will continue to monitor for progression.

## 2023-10-26 NOTE — Progress Notes (Signed)
Subjective:    Patient ID: Clarence Dawson, male    DOB: [birth year], 77 y.o.   MRN: [id number]  Patient Care Team: Carylon Perches, MD as PCP - General (Internal Medicine) Marinus Maw, MD as PCP - Cardiology (Cardiology) Marinus Maw, MD as PCP - Electrophysiology (Cardiology) Salena Saner, MD as Consulting Physician (Pulmonary Disease)  Chief Complaint  Patient presents with   Follow-up    DOE. Wheezing in the morning. Cough in the morning with brown/off white sputum.    BACKGROUND/INTERVAL:Clarence Dawson is a 77 year old former smoker (8 PY) who presents for follow-up from his most recent visit of 12 June 2023.  At that time he was seen by Rubye Oaks, NP.  Clarence Dawson is followed here for COPD with chronic hypoxic respiratory failure, asthma, obstructive sleep apnea and bronchiectasis.  He has also been diagnosed with moderate pulmonary arterial hypertension (group 3), moderate nonobstructive coronary artery disease.  He also has a history of atrial fibrillation on Xarelto.  HPI Discussed the use of AI scribe software for clinical note transcription with the patient, who gave verbal consent to proceed.  History of Present Illness   The patient, with a complex pulmonary history including emphysema, severe asthma, sleep apnea, and bronchiectasis, reports a lack of improvement in lung function despite perceived muscular and balance gains from pulmonary rehabilitation. He notes that the Dupixent, an injectable medication for asthma and COPD, seems to lose efficacy about ten days after administration, leading to an accumulation of respiratory secretions.  The patient also reports adherence to his BiPAP therapy, despite persistent issues with mask seal. He has been using a vest for bronchiectasis management, which seems to aid in loosening respiratory secretions.  In addition, the patient has been experiencing sinus issues, with a sensation of blockage and previous episodes of epistaxis. He is  under the care of an ENT specialist for these issues.  The patient's complex pulmonary issues have necessitated a multi-faceted treatment approach, including anticoagulation, oxygen therapy, and various medications. Despite these interventions, the patient continues to struggle with respiratory symptoms and is open to trying new treatments.     Patient is currently enrolled in pulmonary rehabilitation which he feels has helped him tremendously.  He recently had issues with extensive sinusitis and is being followed by ENT carefully for this issue.  Review of Systems A 10 point review of systems was performed and it is as noted above otherwise negative.   Patient Active Problem List   Diagnosis Date Noted   Chronic respiratory failure with hypoxia (HCC) 06/12/2023   Hypercoagulable state due to persistent atrial fibrillation (HCC) 01/02/2020   Chronic diastolic heart failure (HCC) 11/22/2019   Paroxysmal atrial fibrillation (HCC) 09/22/2019   Atrial fibrillation with RVR (HCC)    Lobar pneumonia (HCC) 09/21/2019   Acute respiratory failure with hypoxia (HCC) 09/21/2019   Acute on chronic respiratory failure (HCC) 09/18/2019   Severe sepsis (HCC)    Elevated lactic acid level    GERD (gastroesophageal reflux disease)    Chronic bronchitis (HCC) 02/09/2019   CAP (community acquired pneumonia) 03/03/2018   Primary localized osteoarthritis of right knee 08/11/2017   Asthma with acute exacerbation 12/08/2016   Chest pain 10/20/2016   HTN (hypertension) 10/20/2016   Morbid obesity due to excess calories (HCC) 06/24/2016   Severe persistent asthma 02/12/2016   Upper airway cough syndrome 01/24/2016   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   Persistent atrial fibrillation    Syncope    Hyperthyroidism  Hyperlipidemia    RBBB (right bundle branch block)    Tricuspid valve disorder    Aortic valve disorder    Carotid artery stenosis    Type 2 diabetes mellitus (HCC)    Pacemaker    OSA  (obstructive sleep apnea)    Arthritis    Sinoatrial node dysfunction (HCC)    PVC's (premature ventricular contractions) 07/17/2011   Essential hypertension, benign 01/24/2011   Premature ventricular contractions 01/24/2011   PPM-[facility] 01/24/2011    Social History   Tobacco Use   Smoking status: Former    Current packs/day: 0.00    Average packs/day: 3.0 packs/day for 26.0 years (77.9 ttl pk-yrs)    Types: Cigarettes    Start date: 02/20/1958    Quit date: 02/12/1984    Years since quitting: 39.7   Smokeless tobacco: Former    Types: Chew   Tobacco comments:    Former smoker 08/30/2021  Substance Use Topics   Alcohol use: Yes    Alcohol/week: 2.0 - 4.0 standard drinks of alcohol    Types: 1 - 2 Glasses of wine, 1 - 2 Cans of beer per week    Comment: 1 glass of wine or beer 3 times a week 12/26/22    Allergies  Allergen Reactions   Food Anaphylaxis and Shortness Of Breath    TREE NUTS   Iodinated Contrast Media Hives and Shortness Of Breath    Patient states hives to throat closing. (01/15/17: patient states this reaction was "about 20 years ago" with possibly an IVP.  He now says high doses of prednisone "throw me into AFib."  He has tolerated CT arthrograms with Benadrly 50mg  PO one hour before injection.  Donell Sievert, RN)   Shellfish Allergy Anaphylaxis and Shortness Of Breath    To shellfish, crabs.  Makes him feel like "things are crawling all over" me.  Denies airway issues with these foods.  Donell Sievert, RN 01/15/17)   Goat-Derived Products Hives    GOAT CHEESE    Prednisone Palpitations    PRECIPITATES A-FIB   Diclofenac Sodium Other (See Comments)    Hives, "buggy feeling all over"   Metformin And Related Diarrhea    High doses at once   Vancomycin Anxiety    Red man syndrome   Voltaren [Diclofenac Sodium] Other (See Comments)    Feels like things are crawling on him    Current Meds  Medication Sig   acetaminophen (TYLENOL) 500 MG tablet Take 1,000 mg  by mouth 3 (three) times daily.   albuterol (PROVENTIL) (2.5 MG/3ML) 0.083% nebulizer solution TAKE 1 VIAL VIA NEBULIZATION EVERY 6 HOURS AS NEEDED FOR WHEEZING OR SHORTNESS OF BREATH.   albuterol (VENTOLIN HFA) 108 (90 Base) MCG/ACT inhaler Inhale 2 puffs into the lungs every 4 (four) hours as needed for shortness of breath (only if you can't catch your breath/ asthma).   atorvastatin (LIPITOR) 20 MG tablet Take 20 mg by mouth at bedtime.   bisoprolol (ZEBETA) 5 MG tablet TAKE 1 AND 1/2 TABLET BY MOUTH TWICE DAILY.   BREZTRI AEROSPHERE 160-9-4.8 MCG/ACT AERO INHALE 2 PUFFS INTO THE LUNGS TWICE DAILY.   Carboxymethylcellul-Glycerin (LUBRICATING EYE DROPS OP) Place 1 drop into the right eye 2 (two) times a week. Clear eyes   clindamycin (CLEOCIN) 300 MG capsule Take 1 capsule (300 mg total) by mouth 3 (three) times daily for 14 days.   diltiazem (CARTIA XT) 120 MG 24 hr capsule TAKE (1) CAPSULE BY MOUTH DAILY, MAY TAKE A  EXTRA CAPSULE DAILY FOR BREAKTHROUGH AFIB.   dofetilide (TIKOSYN) 500 MCG capsule Take 1 capsule (500 mcg total) by mouth 2 (two) times daily.   Dupilumab (DUPIXENT) 300 MG/2ML SOPN Inject 300 mg into the skin every 14 (fourteen) days.   EPINEPHrine 0.3 mg/0.3 mL IJ SOAJ injection Inject 0.3 mg into the muscle as needed for anaphylaxis.   fluticasone (FLONASE) 50 MCG/ACT nasal spray Place 2 sprays into both nostrils daily.   furosemide (LASIX) 40 MG tablet Take 1 tablet (40 mg total) by mouth in the morning AND 0.5 tablets (20 mg total) every evening.   glipiZIDE (GLUCOTROL XL) 5 MG 24 hr tablet Take 5 mg by mouth daily before breakfast.   hydrocortisone cream 0.5 % Apply 1 Application topically daily as needed for itching.   JARDIANCE 25 MG TABS tablet Take 25 mg by mouth daily before breakfast.   levothyroxine (SYNTHROID) 150 MCG tablet Take 150 mcg by mouth daily before breakfast.   losartan (COZAAR) 50 MG tablet Take 50 mg by mouth at bedtime.   metFORMIN (GLUCOPHAGE-XR) 500 MG  24 hr tablet Take 500 mg by mouth in the morning and at bedtime.   Multiple Vitamin (MULTIVITAMIN) tablet Take 1 tablet by mouth daily.   omeprazole (PRILOSEC OTC) 20 MG tablet Take 20 mg by mouth every morning.   oxyCODONE-acetaminophen (PERCOCET/ROXICET) 5-325 MG tablet Take 1 every 6 hours for pain that is not helped by Tylenol or Motrin   predniSONE (DELTASONE) 10 MG tablet Take 1 tablet (10 mg total) by mouth daily with breakfast. Take 1 tablet by mouth daily for 5 days, then 1/2 tablet (5mg ) daily for 5 days.   PRESCRIPTION MEDICATION Thumper vest   Respiratory Therapy Supplies (FLUTTER) DEVI Use as directed   rivaroxaban (XARELTO) 20 MG TABS tablet Take 20 mg by mouth at bedtime.   sodium chloride (OCEAN) 0.65 % SOLN nasal spray Place 1 spray into both nostrils as needed for congestion.    Immunization History  Administered Date(s) Administered   Fluad Quad(high Dose 65+) 09/12/2019, 10/15/2022   Influenza Split 11/14/2015, 10/11/2018   Influenza, High Dose Seasonal PF 09/28/2017   Influenza-Unspecified 11/02/2016, 09/03/2020, 10/03/2021   Moderna SARS-COV2 Booster Vaccination 08/20/2020, 03/04/2021   Moderna Sars-Covid-2 Vaccination 02/15/2020, 03/14/2020   Pneumococcal Conjugate-13 09/28/2015   Tdap 11/28/2019      Objective:     BP (!) 142/70 (BP Location: Right Arm, Cuff Size: Large)   Pulse 84   Temp 98.6 F (37 C)   Ht 5\' 9"  (1.753 m)   Wt 228 lb 6.4 oz (103.6 kg)   SpO2 93%   BMI 33.73 kg/m   SpO2: 93 % O2 Device: Nasal cannula O2 Flow Rate (L/min): 4 L/min O2 Type: Pulse O2  GENERAL: Well-developed, obese patient in no acute distress.  He is comfortable on nasal cannula O2.  He is fully ambulatory. HEAD: Normocephalic, atraumatic.  EYES: Pupils equal, round, reactive to light.  No scleral icterus.  MOUTH:   Upper and lower dentures.  Oral mucosa moist.  No thrush. NECK: Supple. No thyromegaly. Trachea midline. No JVD.  No adenopathy. PULMONARY: Good air  entry bilaterally air entry symmetrical throughout.  Non labored, there are musical wheezes and scattered rhonchi throughout. CARDIOVASCULAR: S1 and S2.  Regular rate and rhythm, NSR, rate 62.  Grade 2/6 mitral regurgitation murmur, grade 3/6 aortic stenosis murmur. Pacemaker on left. GASTROINTESTINAL: Obese, otherwise benign. MUSCULOSKELETAL: No joint deformity, no clubbing, no edema.  NEUROLOGIC: No focal deficits, speech is  fluent.  No gait disturbance noted.  Awake, alert and oriented. SKIN: Intact,warm,dry. PSYCH: Mood and behavior appropriate.   Assessment & Plan:     ICD-10-CM   1. Asthma-COPD overlap syndrome (HCC)  J44.89    COPD on the basis of emphysema and chronic bronchitis Asthma, severe persistent    2. Bronchiectasis without complication (HCC)  J47.9     3. Chronic respiratory failure with hypoxia (HCC)  J96.11     4. Pulmonary HTN (HCC)  I27.20     5. OSA (obstructive sleep apnea)  G47.33 Ambulatory Referral for DME    6. Chronic pansinusitis  J32.4      Orders Placed This Encounter  Procedures   Ambulatory Referral for DME    Referral Priority:   Routine    Referral Type:   Durable Medical Equipment Purchase    Number of Visits Requested:   1   Meds ordered this encounter  Medications   Ensifentrine (OHTUVAYRE) 3 MG/2.5ML SUSP    Sig: Take 3 mg by nebulization 2 (two) times daily.   Assessment and Plan    COPD/Asthma Moderate to severe disease with recent PFT showing decline from 2020. Dupixent seems to lose efficacy before the next dose is due. Discussed the addition of a new medication, Ohtuvayre, to address the other side of inflammation not covered by Dupixent. -Start Otuvayre via nebulizer twice daily. -Continue Dupixent and consider weaning off in the future.  Sleep Apnea Patient reports inconsistent mask seal with current BiPAP mask. -Try Dream Wear full face mask for better seal.  Bronchiectasis Patient using vest daily and reports it helps  to loosen up secretions. -Continue current management.  General Health Maintenance Patient has received flu shot and plans to receive COVID, RSV, and Shingles vaccines. -Continue with planned vaccinations.  Follow-up in 6-8 weeks.      Gailen Shelter, MD Advanced Bronchoscopy PCCM [facility]    *This note was dictated using voice recognition software/Dragon.  Despite best efforts to proofread, errors can occur which can change the meaning. Any transcriptional errors that result from this process are unintentional and may not be fully corrected at the time of dictation.

## 2023-10-26 NOTE — Patient Instructions (Signed)
VISIT SUMMARY:  During today's visit, we discussed your ongoing respiratory issues, including COPD, asthma, sleep apnea, and bronchiectasis. We reviewed your current treatments and made some adjustments to better manage your symptoms. We also talked about your general health maintenance and upcoming vaccinations.  YOUR PLAN:  -COPD/ASTHMA: COPD and asthma are chronic lung conditions that cause breathing difficulties. Your recent lung function test showed a decline, and we discussed adding a new medication, Ohtuvayre, to help manage inflammation. You should start using Ohtuvayrer via nebulizer twice daily and continue with Dupixent for now, with the possibility of reducing it in the future.  -SLEEP APNEA: Sleep apnea is a condition where breathing repeatedly stops and starts during sleep. You mentioned issues with your current BiPAP mask seal, so we recommend trying the Dream Wear full face mask for a better fit.  -BRONCHIECTASIS: Bronchiectasis is a condition where the airways in the lungs become widened and scarred, leading to mucus build-up. You are currently using a vest daily to help loosen secretions, and we advise you to continue this management.  -GENERAL HEALTH MAINTENANCE: You have received your flu shot and plan to get the COVID, RSV, and Shingles vaccines. Continue with these planned vaccinations to maintain your overall health.  INSTRUCTIONS:  Please follow up in 6-8 weeks to review your progress and make any necessary adjustments to your treatment plan.

## 2023-10-27 ENCOUNTER — Telehealth: Payer: Self-pay

## 2023-10-27 NOTE — Telephone Encounter (Signed)
I have faxed it to the fax number given.

## 2023-10-27 NOTE — Telephone Encounter (Signed)
Patient was seen in the office on 10/26/2023. Dr. Jayme Cloud has ordered [facility] for the patient.  He has signed the form and it has been faxed to the Pharmacy Team for completion.

## 2023-10-27 NOTE — Telephone Encounter (Signed)
Received forms from [facility]. Faxed with clinicals and insurance card copy to [employer]  Phone: [phone] Fax: [phone]  Chesley Mires, PharmD, MPH, BCPS, CPP Clinical Pharmacist (Rheumatology and Pulmonology)

## 2023-10-28 ENCOUNTER — Encounter (HOSPITAL_COMMUNITY)
Admission: RE | Admit: 2023-10-28 | Discharge: 2023-10-28 | Disposition: A | Discharge status: Home / Self Care | Payer: Medicare Other | Source: Ambulatory Visit | Attending: Cardiology | Admitting: Cardiology

## 2023-10-28 DIAGNOSIS — I272 Pulmonary hypertension, unspecified: Secondary | ICD-10-CM

## 2023-10-28 NOTE — Progress Notes (Signed)
Daily Session Note  Patient Details  Name: CADEL OVERMAN MRN: [id number] Date of Birth: May 14, 1946 Referring Provider:   Flowsheet Row PULMONARY REHAB OTHER RESP ORIENTATION from 09/03/2023 in [facility] CARDIAC REHABILITATION  Referring Provider Marca Ancona MD       Encounter Date: 10/28/2023  Check In:  Session Check In - 10/28/23 0810       Check-In   Supervising physician immediately available to respond to emergencies See telemetry face sheet for immediately available MD    Location [facility]    Staff Present Ross Ludwig, BS, Exercise Physiologist;Debra Laural Benes, RN, Thomos Lemons, MA, RCEP, CCRP, CCET    Virtual Visit No    Medication changes reported     No    Fall or balance concerns reported    No    Warm-up and Cool-down Performed on first and last piece of equipment    Resistance Training Performed Yes    VAD Patient? No    PAD/SET Patient? No      Pain Assessment   Currently in Pain? No/denies             Capillary Blood Glucose: No results found for this or any previous visit (from the past 24 hour(s)).    Social History   Tobacco Use  Smoking Status Former   Current packs/day: 0.00   Average packs/day: 3.0 packs/day for 26.0 years (77.9 ttl pk-yrs)   Types: Cigarettes   Start date: 02/20/1958   Quit date: 02/12/1984   Years since quitting: 39.7  Smokeless Tobacco Former   Types: Chew  Tobacco Comments   Former smoker 08/30/2021    Goals Met:  Proper associated with RPD/PD & O2 Sat Independence with exercise equipment Using PLB without cueing & demonstrates good technique Exercise tolerated well No report of concerns or symptoms today Strength training completed today  Goals Unmet:  Not Applicable  Comments: Pt able to follow exercise prescription today without complaint.  Will continue to monitor for progression.

## 2023-10-29 NOTE — Telephone Encounter (Signed)
Received fax from [facility] that rx will be triaged to [facility]. Pending BIV and welcome call to patient  Patient ID: [id number]  Chesley Mires, PharmD, MPH, BCPS, CPP Clinical Pharmacist (Rheumatology and Pulmonology)\

## 2023-10-30 ENCOUNTER — Encounter (HOSPITAL_COMMUNITY): Payer: Medicare Other

## 2023-11-02 ENCOUNTER — Encounter (HOSPITAL_COMMUNITY)
Admission: RE | Admit: 2023-11-02 | Discharge: 2023-11-02 | Disposition: A | Discharge status: Home / Self Care | Payer: Medicare Other | Source: Ambulatory Visit | Attending: Cardiology | Admitting: Cardiology

## 2023-11-02 DIAGNOSIS — I442 Atrioventricular block, complete: Secondary | ICD-10-CM | POA: Diagnosis present

## 2023-11-02 DIAGNOSIS — I4819 Other persistent atrial fibrillation: Secondary | ICD-10-CM | POA: Insufficient documentation

## 2023-11-02 DIAGNOSIS — I272 Pulmonary hypertension, unspecified: Secondary | ICD-10-CM | POA: Diagnosis present

## 2023-11-02 DIAGNOSIS — J324 Chronic pansinusitis: Secondary | ICD-10-CM | POA: Diagnosis present

## 2023-11-02 NOTE — Progress Notes (Signed)
Daily Session Note  Patient Details  Name: Clarence Dawson MRN: [id number] Date of Birth: 05-Mar-1946 Referring Provider:   Flowsheet Row PULMONARY REHAB OTHER RESP ORIENTATION from 09/03/2023 in [facility] CARDIAC REHABILITATION  Referring Provider Marca Ancona MD       Encounter Date: 11/02/2023  Check In:  Session Check In - 11/02/23 0745       Check-In   Supervising physician immediately available to respond to emergencies See telemetry face sheet for immediately available MD    Location [facility]    Staff Present Ross Ludwig, BS, Exercise Physiologist;Jessica Juanetta Gosling, MA, RCEP, CCRP, CCET    Medication changes reported     No    Fall or balance concerns reported    No    Tobacco Cessation No Change    Warm-up and Cool-down Performed on first and last piece of equipment    Resistance Training Performed Yes    VAD Patient? No    PAD/SET Patient? No      Pain Assessment   Currently in Pain? No/denies    Pain Score 0-No pain    Multiple Pain Sites No             Capillary Blood Glucose: No results found for this or any previous visit (from the past 24 hour(s)).    Social History   Tobacco Use  Smoking Status Former   Current packs/day: 0.00   Average packs/day: 3.0 packs/day for 26.0 years (77.9 ttl pk-yrs)   Types: Cigarettes   Start date: 02/20/1958   Quit date: 02/12/1984   Years since quitting: 39.7  Smokeless Tobacco Former   Types: Chew  Tobacco Comments   Former smoker 08/30/2021    Goals Met:  Independence with exercise equipment Using PLB without cueing & demonstrates good technique Exercise tolerated well Queuing for purse lip breathing No report of concerns or symptoms today Strength training completed today  Goals Unmet:  Not Applicable  Comments: Pt able to follow exercise prescription today without complaint.  Will continue to monitor for progression.

## 2023-11-04 ENCOUNTER — Encounter (HOSPITAL_COMMUNITY): Payer: Self-pay | Admitting: *Deleted

## 2023-11-04 ENCOUNTER — Encounter (HOSPITAL_COMMUNITY)
Admission: RE | Admit: 2023-11-04 | Discharge: 2023-11-04 | Disposition: A | Discharge status: Home / Self Care | Payer: Medicare Other | Source: Ambulatory Visit | Attending: Cardiology

## 2023-11-04 DIAGNOSIS — I272 Pulmonary hypertension, unspecified: Secondary | ICD-10-CM

## 2023-11-04 NOTE — Progress Notes (Signed)
Pulmonary Individual Treatment Plan  Patient Details  Name: Clarence Dawson MRN: [id number] Date of Birth: [birth year] Referring Provider:   Flowsheet Row PULMONARY REHAB OTHER RESP ORIENTATION from 09/03/2023 in [facility] CARDIAC REHABILITATION  Referring Provider Marca Ancona MD       Initial Encounter Date:  Flowsheet Row PULMONARY REHAB OTHER RESP ORIENTATION from 09/03/2023 in [place] CARDIAC REHABILITATION  Date 09/03/23       Visit Diagnosis: Pulmonary hypertension (HCC)  Patient's Home Medications on Admission:   Current Outpatient Medications:    acetaminophen (TYLENOL) 500 MG tablet, Take 1,000 mg by mouth 3 (three) times daily., Disp: , Rfl:    albuterol (PROVENTIL) (2.5 MG/3ML) 0.083% nebulizer solution, TAKE 1 VIAL VIA NEBULIZATION EVERY 6 HOURS AS NEEDED FOR WHEEZING OR SHORTNESS OF BREATH., Disp: 90 mL, Rfl: 2   albuterol (VENTOLIN HFA) 108 (90 Base) MCG/ACT inhaler, Inhale 2 puffs into the lungs every 4 (four) hours as needed for shortness of breath (only if you can't catch your breath/ asthma)., Disp: 18 g, Rfl: 5   atorvastatin (LIPITOR) 20 MG tablet, Take 20 mg by mouth at bedtime., Disp: , Rfl:    bisoprolol (ZEBETA) 5 MG tablet, TAKE 1 AND 1/2 TABLET BY MOUTH TWICE DAILY., Disp: 90 tablet, Rfl: 3   BREZTRI AEROSPHERE 160-9-4.8 MCG/ACT AERO, INHALE 2 PUFFS INTO THE LUNGS TWICE DAILY., Disp: 10.7 g, Rfl: 5   Carboxymethylcellul-Glycerin (LUBRICATING EYE DROPS OP), Place 1 drop into the right eye 2 (two) times a week. Clear eyes, Disp: , Rfl:    diltiazem (CARTIA XT) 120 MG 24 hr capsule, TAKE (1) CAPSULE BY MOUTH DAILY, MAY TAKE A EXTRA CAPSULE DAILY FOR BREAKTHROUGH AFIB., Disp: 180 capsule, Rfl: 3   dofetilide (TIKOSYN) 500 MCG capsule, Take 1 capsule (500 mcg total) by mouth 2 (two) times daily., Disp: 180 capsule, Rfl: 3   Dupilumab (DUPIXENT) 300 MG/2ML SOPN, Inject 300 mg into the skin every 14 (fourteen) days., Disp: 12 mL, Rfl: 1   Ensifentrine (OHTUVAYRE)  3 MG/2.5ML SUSP, Take 3 mg by nebulization 2 (two) times daily., Disp: , Rfl:    EPINEPHrine 0.3 mg/0.3 mL IJ SOAJ injection, Inject 0.3 mg into the muscle as needed for anaphylaxis., Disp: 1 each, Rfl: 5   fluticasone (FLONASE) 50 MCG/ACT nasal spray, Place 2 sprays into both nostrils daily., Disp: 16 g, Rfl: 6   furosemide (LASIX) 40 MG tablet, Take 1 tablet (40 mg total) by mouth in the morning AND 0.5 tablets (20 mg total) every evening., Disp: 90 tablet, Rfl: 3   glipiZIDE (GLUCOTROL XL) 5 MG 24 hr tablet, Take 5 mg by mouth daily before breakfast., Disp: , Rfl:    hydrocortisone cream 0.5 %, Apply 1 Application topically daily as needed for itching., Disp: , Rfl:    JARDIANCE 25 MG TABS tablet, Take 25 mg by mouth daily before breakfast., Disp: , Rfl:    levothyroxine (SYNTHROID) 150 MCG tablet, Take 150 mcg by mouth daily before breakfast., Disp: , Rfl:    losartan (COZAAR) 50 MG tablet, Take 50 mg by mouth at bedtime., Disp: , Rfl:    metFORMIN (GLUCOPHAGE-XR) 500 MG 24 hr tablet, Take 500 mg by mouth in the morning and at bedtime., Disp: , Rfl:    Multiple Vitamin (MULTIVITAMIN) tablet, Take 1 tablet by mouth daily., Disp: , Rfl:    omeprazole (PRILOSEC OTC) 20 MG tablet, Take 20 mg by mouth every morning., Disp: , Rfl:    oxyCODONE-acetaminophen (PERCOCET/ROXICET) 5-325 MG tablet,  Take 1 every 6 hours for pain that is not helped by Tylenol or Motrin, Disp: 15 tablet, Rfl: 0   predniSONE (DELTASONE) 10 MG tablet, Take 1 tablet (10 mg total) by mouth daily with breakfast. Take 1 tablet by mouth daily for 5 days, then 1/2 tablet (5mg ) daily for 5 days., Disp: 8 tablet, Rfl: 0   PRESCRIPTION MEDICATION, Thumper vest, Disp: , Rfl:    Respiratory Therapy Supplies (FLUTTER) DEVI, Use as directed, Disp: 1 each, Rfl: 0   rivaroxaban (XARELTO) 20 MG TABS tablet, Take 20 mg by mouth at bedtime., Disp: , Rfl:    sodium chloride (OCEAN) 0.65 % SOLN nasal spray, Place 1 spray into both nostrils as  needed for congestion., Disp: , Rfl:   Past Medical History: Past Medical History:  Diagnosis Date   Allergic rhinitis    Aortic valve disorder    Asthma    since childhood- seasonal allergies induced   Cancer (HCC)    Skin cancer- squamous, basal   Carotid artery stenosis    Essential hypertension    Full dentures    GERD (gastroesophageal reflux disease)    H/O hiatal hernia    Hemorrhage of rectum    Hyperlipidemia    Hypothyroidism    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   PAF (paroxysmal atrial fibrillation) (HCC)    Pneumonia    "several Times" 2015 last time   Primary localized osteoarthritis of right knee 08/11/2017   RBBB (right bundle branch block)    Sinoatrial node dysfunction (HCC)    Syncope    Tricuspid valve disorder    Type 2 diabetes mellitus (HCC)    Type II    Tobacco Use: Social History   Tobacco Use  Smoking Status Former   Current packs/day: 0.00   Average packs/day: 3.0 packs/day for 26.0 years (77.9 ttl pk-yrs)   Types: Cigarettes   Start date: 02/20/1958   Quit date: 02/12/1984   Years since quitting: 39.7  Smokeless Tobacco Former   Types: Chew  Tobacco Comments   Former smoker 08/30/2021    Labs: Review Flowsheet  More data exists      Latest Ref Rng & Units 10/20/2016 08/13/2017 09/18/2019 08/28/2023 09/01/2023  Labs for ITP Cardiac and Pulmonary Rehab  Cholestrol 0 - 200 mg/dL - - - - 784   LDL (calc) 0 - 99 mg/dL - - - - 73   HDL-C >69 mg/dL - - - - 34   Trlycerides <150 mg/dL - - - - 629   Hemoglobin A1c 4.8 - 5.6 % 8.1  7.2  7.2  - -  PH, Arterial 7.35 - 7.45 - - - 7.438  -  PCO2 arterial 32 - 48 mmHg - - - 32.7  -  Bicarbonate 20.0 - 28.0 mmol/L - - - 22.1  25.0  24.7  -  TCO2 22 - 32 mmol/L - - - 23  26  26   -  Acid-base deficit 0.0 - 2.0 mmol/L - - - 1.0  -  O2 Saturation % - - - 95  65  64  -    Details       Multiple values from one day are sorted  in reverse-chronological order         Capillary Blood Glucose: Lab Results  Component Value Date   GLUCAP 139 (H) 09/09/2023   GLUCAP 207 (H)  09/09/2023   GLUCAP 106 (H) 09/07/2023   GLUCAP 119 (H) 09/07/2023   GLUCAP 130 (H) 09/04/2023     Pulmonary Assessment Scores:  UCSD: Self-administered rating of dyspnea associated with activities of daily living (ADLs) 6-point scale (0 = "not at all" to 5 = "maximal or unable to do because of breathlessness")  Scoring Scores range from 0 to 120.  Minimally important difference is 5 units  CAT: CAT can identify the health impairment of COPD patients and is better correlated with disease progression.  CAT has a scoring range of zero to 40. The CAT score is classified into four groups of low (less than 10), medium (10 - 20), high (21-30) and very high (31-40) based on the impact level of disease on health status. A CAT score over 10 suggests significant symptoms.  A worsening CAT score could be explained by an exacerbation, poor medication adherence, poor inhaler technique, or progression of COPD or comorbid conditions.  CAT MCID is 2 points  mMRC: mMRC (Modified Medical Research Council) Dyspnea Scale is used to assess the degree of baseline functional disability in patients of respiratory disease due to dyspnea. No minimal important difference is established. A decrease in score of 1 point or greater is considered a positive change.   Pulmonary Function Assessment:   Exercise Target Goals: Exercise Program Goal: Individual exercise prescription set using results from initial 6 min walk test and THRR while considering  patient's activity barriers and safety.   Exercise Prescription Goal: Initial exercise prescription builds to 30-45 minutes a day of aerobic activity, 2-3 days per week.  Home exercise guidelines will be given to patient during program as part of exercise prescription that the participant will acknowledge.  Activity  Barriers & Risk Stratification:   6 Minute Walk:   Oxygen Initial Assessment:   Oxygen Re-Evaluation:  Oxygen Re-Evaluation     Row Name 09/16/23 0838 10/21/23 0810           Program Oxygen Prescription   Program Oxygen Prescription Continuous;E-Tanks Continuous;E-Tanks      Liters per minute 6 6      Comments Bill is on 6 liters when exercising --        Home Oxygen   Home Oxygen Device Portable Concentrator;Home Concentrator Portable Concentrator;Home Concentrator      Sleep Oxygen Prescription BiPAP;Continuous BiPAP;Continuous      Liters per minute 4 4      Home Exercise Oxygen Prescription Continuous Continuous      Liters per minute 4 4      Home Resting Oxygen Prescription Continuous Continuous      Liters per minute 3 4      Compliance with Home Oxygen Use Yes Yes        Goals/Expected Outcomes   Short Term Goals To learn and demonstrate proper pursed lip breathing techniques or other breathing techniques.  To learn and exhibit compliance with exercise, home and travel O2 prescription;To learn and understand importance of monitoring SPO2 with pulse oximeter and demonstrate accurate use of the pulse oximeter.;To learn and understand importance of maintaining oxygen saturations>88%;To learn and demonstrate proper pursed lip breathing techniques or other breathing techniques.       Long  Term Goals Exhibits proper breathing techniques, such as pursed lip breathing or other method taught during program session Exhibits compliance with exercise, home  and travel O2 prescription;Verbalizes importance of monitoring SPO2 with pulse oximeter and return demonstration;Maintenance of O2 saturations>88%;Exhibits proper breathing techniques, such as pursed  lip breathing or other method taught during program session;Compliance with respiratory medication      Comments Clarence Dawson has noticed an slight increase in hois breathing. He is able to go around the grocery store without giving out and  being SOB. He uses PLB when he feels SOB and it has helped him bring his oxygen levels up. He checks his levels during the day and stated that they stay WNL. Clarence Dawson is doing well in rehab.  He was out last week with a sinus infection and is having a hard time with his breathing from his sinuses.  They are starting to feel better but now struggling with side effects from antibiotic.  He is doing well with his PLB and pulm meds.  He is compliant with oxygen and is now on 6L since infection continuously.  He hopes to be able to drop back to 4L for rest once recovered.  He had a breathing test yesterday with various test done.  He felt like he had worked out for an hour.      Goals/Expected Outcomes Short: continue to monitor oxygen levels   long term: continue to be compliant with oxygen Short: Continue to work on breathing and recovery Long: conitnued compliance               Oxygen Discharge (Final Oxygen Re-Evaluation):  Oxygen Re-Evaluation - 10/21/23 0810       Program Oxygen Prescription   Program Oxygen Prescription Continuous;E-Tanks    Liters per minute 6      Home Oxygen   Home Oxygen Device Portable Concentrator;Home Concentrator    Sleep Oxygen Prescription BiPAP;Continuous    Liters per minute 4    Home Exercise Oxygen Prescription Continuous    Liters per minute 4    Home Resting Oxygen Prescription Continuous    Liters per minute 4    Compliance with Home Oxygen Use Yes      Goals/Expected Outcomes   Short Term Goals To learn and exhibit compliance with exercise, home and travel O2 prescription;To learn and understand importance of monitoring SPO2 with pulse oximeter and demonstrate accurate use of the pulse oximeter.;To learn and understand importance of maintaining oxygen saturations>88%;To learn and demonstrate proper pursed lip breathing techniques or other breathing techniques.     Long  Term Goals Exhibits compliance with exercise, home  and travel O2  prescription;Verbalizes importance of monitoring SPO2 with pulse oximeter and return demonstration;Maintenance of O2 saturations>88%;Exhibits proper breathing techniques, such as pursed lip breathing or other method taught during program session;Compliance with respiratory medication    Comments Clarence Dawson is doing well in rehab.  He was out last week with a sinus infection and is having a hard time with his breathing from his sinuses.  They are starting to feel better but now struggling with side effects from antibiotic.  He is doing well with his PLB and pulm meds.  He is compliant with oxygen and is now on 6L since infection continuously.  He hopes to be able to drop back to 4L for rest once recovered.  He had a breathing test yesterday with various test done.  He felt like he had worked out for an hour.    Goals/Expected Outcomes Short: Continue to work on breathing and recovery Long: conitnued compliance             Initial Exercise Prescription:   Perform Capillary Blood Glucose checks as needed.  Exercise Prescription Changes:   Exercise Prescription Changes  Row Name 09/21/23 1300 10/07/23 1300 10/21/23 1200 11/02/23 1200       Response to Exercise   Blood Pressure (Admit) 132/74 136/60 112/60 134/60    Blood Pressure (Exercise) 108/58 -- -- --    Blood Pressure (Exit) 128/58 126/74 114/62 120/60    Heart Rate (Admit) 60 bpm 1 bpm 60 bpm 60 bpm    Heart Rate (Exercise) 60 bpm 61 bpm 60 bpm 60 bpm    Heart Rate (Exit) 60 bpm 61 bpm 60 bpm 60 bpm    Oxygen Saturation (Admit) 90 % 95 % 94 % 94 %    Oxygen Saturation (Exercise) 88 % 89 % 91 % 90 %    Oxygen Saturation (Exit) 97 % 90 % 94 % 93 %    Rating of Perceived Exertion (Exercise) 13 12 13 12     Perceived Dyspnea (Exercise) 2 1 2 1     Duration Continue with 30 min of aerobic exercise without signs/symptoms of physical distress. Continue with 30 min of aerobic exercise without signs/symptoms of physical distress. Continue with  30 min of aerobic exercise without signs/symptoms of physical distress. Continue with 30 min of aerobic exercise without signs/symptoms of physical distress.    Intensity THRR unchanged THRR unchanged THRR unchanged THRR unchanged      Progression   Progression Continue to progress workloads to maintain intensity without signs/symptoms of physical distress. Continue to progress workloads to maintain intensity without signs/symptoms of physical distress. Continue to progress workloads to maintain intensity without signs/symptoms of physical distress. Continue to progress workloads to maintain intensity without signs/symptoms of physical distress.      Resistance Training   Training Prescription Yes Yes Yes Yes    Weight 5 lbs 5 lbs 5 lbs 5 lbs    Reps 10-15 10-15 10-15 10-15    Time 1 Minutes -- -- --      Oxygen   Oxygen Continuous Continuous Continuous Continuous    Liters 4 6 6 6       Treadmill   MPH 2 2 2 2     Grade 0.5 0 0 0    Minutes 15 15 15 15     METs 2.67 2.53 2.53 2.53      REL-XR   Level 3 2 2 2     Speed 51 53 54 55    Minutes 15 15 15 15     METs 2.5 2.4 2.6 2.4      Oxygen   Maintain Oxygen Saturation 88% or higher 88% or higher 88% or higher 88% or higher             Exercise Comments:   Exercise Goals and Review:   Exercise Goals Re-Evaluation :  Exercise Goals Re-Evaluation     Row Name 09/16/23 0805 09/18/23 0759 09/21/23 1336 10/07/23 1316 10/21/23 0816     Exercise Goal Re-Evaluation   Exercise Goals Review Increase Physical Activity;Increase Strength and Stamina;Understanding of Exercise Prescription Increase Physical Activity;Increase Strength and Stamina;Able to understand and use rate of perceived exertion (RPE) scale;Able to understand and use Dyspnea scale;Knowledge and understanding of Target Heart Rate Range (THRR);Able to check pulse independently;Understanding of Exercise Prescription Increase Physical Activity;Increase Strength and  Stamina;Understanding of Exercise Prescription Increase Physical Activity;Increase Strength and Stamina;Understanding of Exercise Prescription Increase Physical Activity;Increase Strength and Stamina;Understanding of Exercise Prescription   Comments Clarence Dawson has been doing good with rehab. Clarence Dawson has noticed as slight improvment since starting the program. He was able to go around the grocery store and  back to his car without giving out. He does have to stop for short breaks when on the treadmill due to RLE pain. HE also likes coming to class for social. Clarence Dawson is off to a good start in rehab.  He has already started to notice an improvement in his stamina and feeling more clear headed.  Reviewed home exercise with pt today.  Pt plans to walk and use weights at home for exercise.  Reviewed THR, pulse, RPE, sign and symptoms, pulse oximetery and when to call 911 or MD.  Also discussed weather considerations and indoor options.  Pt voiced understanding. Clarence Dawson has been tolerating exercise well. He has to take breaks on the treadmill due to calf pain. He is walking at a speed/grade of 2.0/0.5. He has increased his level on the XR to level 3. Will continue to monitor and progress as able. Clarence Dawson has been tolerating exercise well. He continues to take breaks on the treadmill due to calf pain. He increased his oxygen level to 6 liters today to improve breathing sue to having a bad day. Will continue to monitor and progress asa able. Clarence Dawson is doing well in rehab. He was out last week from a sinus infection.  He was able to walk some at home while he was out.  Yesterday they had him in for a breathing test and he feels like he got a good workout from that.  He was feeling better until he got sick.   Expected Outcomes Short: go over home exericse   long: continue to attend pulmponary rehab. Short: Start to add in more walking at home Long: Conitnue to improve stamina Short term: continue to exercise at current workloads until he builds  resisitance   long term: continue to attend rehab Short term: continue to exercise at current workloads until he builds resisitance   long term: continue to attend rehab Short: Get back to routine of exercise Long: continue to improve stamina    Row Name 11/03/23 0741             Exercise Goal Re-Evaluation   Exercise Goals Review Increase Physical Activity;Able to understand and use rate of perceived exertion (RPE) scale;Understanding of Exercise Prescription       Comments Clarence Dawson is doing good in rehab. He has not increased his level on the XR or treadmill in the last two weeks. He is ;eve; 2 on the XR  and speed 2.0 on the treadmill with an RPE of 12. He does have to take breaks on the treadmill due to his legs cramping and feeling tired. He is curretnly exercising at 2.53 METs on  the treadmill. Will continue to monitor and progress as able.       Expected Outcomes Short: increase to level 3 on the XR  long term: continue to attend rehab                Discharge Exercise Prescription (Final Exercise Prescription Changes):  Exercise Prescription Changes - 11/02/23 1200       Response to Exercise   Blood Pressure (Admit) 134/60    Blood Pressure (Exit) 120/60    Heart Rate (Admit) 60 bpm    Heart Rate (Exercise) 60 bpm    Heart Rate (Exit) 60 bpm    Oxygen Saturation (Admit) 94 %    Oxygen Saturation (Exercise) 90 %    Oxygen Saturation (Exit) 93 %    Rating of Perceived Exertion (Exercise) 12    Perceived Dyspnea (Exercise)  1    Duration Continue with 30 min of aerobic exercise without signs/symptoms of physical distress.    Intensity THRR unchanged      Progression   Progression Continue to progress workloads to maintain intensity without signs/symptoms of physical distress.      Resistance Training   Training Prescription Yes    Weight 5 lbs    Reps 10-15      Oxygen   Oxygen Continuous    Liters 6      Treadmill   MPH 2    Grade 0    Minutes 15    METs 2.53       REL-XR   Level 2    Speed 55    Minutes 15    METs 2.4      Oxygen   Maintain Oxygen Saturation 88% or higher             Nutrition:  Target Goals: Understanding of nutrition guidelines, daily intake of sodium 1500mg , cholesterol 200mg , calories 30% from fat and 7% or less from saturated fats, daily to have 5 or more servings of fruits and vegetables.  Biometrics:    Nutrition Therapy Plan and Nutrition Goals:   Nutrition Assessments:  MEDIFICTS Score Key: >=70 Need to make dietary changes  40-70 Heart Healthy Diet <= 40 Therapeutic Level Cholesterol Diet  Flowsheet Row PULMONARY REHAB OTHER RESPIRATORY from 09/04/2023 in [facility] CARDIAC REHABILITATION  Picture Your Plate Total Score on Admission 58      Picture Your Plate Scores: <84 Unhealthy dietary pattern with much room for improvement. 41-50 Dietary pattern unlikely to meet recommendations for good health and room for improvement. 51-60 More healthful dietary pattern, with some room for improvement.  >60 Healthy dietary pattern, although there may be some specific behaviors that could be improved.    Nutrition Goals Re-Evaluation:  Nutrition Goals Re-Evaluation     Row Name 09/16/23 [phone] 10/21/23 0819           Goals   Nutrition Goal Healthy eating Short term: continue to eat smaller portions long term: continue to eat healthy      Comment Clarence Dawson stated that he eats a lot of chicken throughout that week, he will have some beef and fish every once in a while. He also eats lots of veggies with his meals and also will do salads. He normally eats eggs some bacon and toast for breaksfest. He has been eating smaller portions, more veggies in the last 6 months and has lost about 20 lbs. He has cut down on sweets but does eat a hersey bar during the week but has not had cake and pie in a while. Clarence Dawson is doing well in rehab. He has not been able to eat and feels bad on his antibiotic from his sinus infection.   He was able to eat last night for first time in a week.  He wants to get to feeling better again. His sister that lives with him helps him stay focused on eating healthy too.      Expected Outcome Short term: continue to eat smaller portions   long term: continue to eat healthy Short: Get appetite back Long: Continue to eat healthy               Nutrition Goals Discharge (Final Nutrition Goals Re-Evaluation):  Nutrition Goals Re-Evaluation - 10/21/23 0819       Goals   Nutrition Goal Short term: continue to eat smaller portions long  term: continue to eat healthy    Comment Clarence Dawson is doing well in rehab. He has not been able to eat and feels bad on his antibiotic from his sinus infection.  He was able to eat last night for first time in a week.  He wants to get to feeling better again. His sister that lives with him helps him stay focused on eating healthy too.    Expected Outcome Short: Get appetite back Long: Continue to eat healthy             Psychosocial: Target Goals: Acknowledge presence or absence of significant depression and/or stress, maximize coping skills, provide positive support system. Participant is able to verbalize types and ability to use techniques and skills needed for reducing stress and depression.  Initial Review & Psychosocial Screening:   Quality of Life Scores:  Scores of 19 and below usually indicate a poorer quality of life in these areas.  A difference of  2-3 points is a clinically meaningful difference.  A difference of 2-3 points in the total score of the Quality of Life Index has been associated with significant improvement in overall quality of life, self-image, physical symptoms, and general health in studies assessing change in quality of life.   PHQ-9: Review Flowsheet       09/03/2023  Depression screen PHQ 2/9  Decreased Interest 0  Down, Depressed, Hopeless 0  PHQ - 2 Score 0  Altered sleeping 0  Tired, decreased energy 0  Change in  appetite 0  Feeling bad or failure about yourself  0  Trouble concentrating 0  Moving slowly or fidgety/restless 0  Suicidal thoughts 0  PHQ-9 Score 0  Difficult doing work/chores Not difficult at all    Details           Interpretation of Total Score  Total Score Depression Severity:  1-4 = Minimal depression, 5-9 = Mild depression, 10-14 = Moderate depression, 15-19 = Moderately severe depression, 20-27 = Severe depression   Psychosocial Evaluation and Intervention:   Psychosocial Re-Evaluation:  Psychosocial Re-Evaluation     Row Name 09/16/23 0813 09/18/23 0802 10/21/23 0808         Psychosocial Re-Evaluation   Current issues with None Identified None Identified;Current Stress Concerns Current Stress Concerns     Comments Clarence Dawson stated that he has not had any issue with sleep or any major stress factors. He said he has been feeling more energized since starting the program. He talks to his kids and grandkids often and it bring him joy -- Clarence Dawson is doing well in rehab.  He was out last week with a sinus infection.  He is now on an antibiotic for 14 days which has him feeling bad.  He is here today, but not feeling the best.  He continues to look out for family and enjoy his grandkids.     Expected Outcomes Short: continue to identify stressors    long term: continue to find ways that make him happy -- Short: Conitnue to recover from infection Long: conitnue to focus on the positives.     Interventions Stress management education;Relaxation education;Encouraged to attend Pulmonary Rehabilitation for the exercise -- Stress management education;Relaxation education;Encouraged to attend Pulmonary Rehabilitation for the exercise     Continue Psychosocial Services  Follow up required by staff -- Follow up required by staff              Psychosocial Discharge (Final Psychosocial Re-Evaluation):  Psychosocial Re-Evaluation - 10/21/23 8469  Psychosocial Re-Evaluation    Current issues with Current Stress Concerns    Comments Clarence Dawson is doing well in rehab.  He was out last week with a sinus infection.  He is now on an antibiotic for 14 days which has him feeling bad.  He is here today, but not feeling the best.  He continues to look out for family and enjoy his grandkids.    Expected Outcomes Short: Conitnue to recover from infection Long: conitnue to focus on the positives.    Interventions Stress management education;Relaxation education;Encouraged to attend Pulmonary Rehabilitation for the exercise    Continue Psychosocial Services  Follow up required by staff              Education: Education Goals: Education classes will be provided on a weekly basis, covering required topics. Participant will state understanding/return demonstration of topics presented.  Learning Barriers/Preferences:   Education Topics: How Lungs Work and Diseases: - Discuss the anatomy of the lungs and diseases that can affect the lungs, such as COPD.   Exercise: -Discuss the importance of exercise, FITT principles of exercise, normal and abnormal responses to exercise, and how to exercise safely.   Environmental Irritants: -Discuss types of environmental irritants and how to limit exposure to environmental irritants.   Meds/Inhalers and oxygen: - Discuss respiratory medications, definition of an inhaler and oxygen, and the proper way to use an inhaler and oxygen.   Energy Saving Techniques: - Discuss methods to conserve energy and decrease shortness of breath when performing activities of daily living.    Bronchial Hygiene / Breathing Techniques: - Discuss breathing mechanics, pursed-lip breathing technique,  proper posture, effective ways to clear airways, and other functional breathing techniques   Cleaning Equipment: - Provides group verbal and written instruction about the health risks of elevated stress, cause of high stress, and healthy ways to reduce  stress.   Nutrition I: Fats: - Discuss the types of cholesterol, what cholesterol does to the body, and how cholesterol levels can be controlled.   Nutrition II: Labels: -Discuss the different components of food labels and how to read food labels.   Respiratory Infections: - Discuss the signs and symptoms of respiratory infections, ways to prevent respiratory infections, and the importance of seeking medical treatment when having a respiratory infection.   Stress I: Signs and Symptoms: - Discuss the causes of stress, how stress may lead to anxiety and depression, and ways to limit stress. Flowsheet Row PULMONARY REHAB OTHER RESPIRATORY from 10/28/2023 in [facility]  Date 10/21/23  Educator [facility]  Instruction Review Code 1- Verbalizes Understanding       Stress II: Relaxation: -Discuss relaxation techniques to limit stress. Flowsheet Row PULMONARY REHAB OTHER RESPIRATORY from 10/28/2023 in [facility]  Date 10/21/23  Educator [facility]  Instruction Review Code 1- Verbalizes Understanding       Oxygen for Home/Travel: - Discuss how to prepare for travel when on oxygen and proper ways to transport and store oxygen to ensure safety.   Knowledge Questionnaire Score:   Core Components/Risk Factors/Patient Goals at Admission:   Core Components/Risk Factors/Patient Goals Review:   Goals and Risk Factor Review     Row Name 09/16/23 0831 10/21/23 0820           Core Components/Risk Factors/Patient Goals Review   Personal Goals Review Weight Management/Obesity;Improve shortness of breath with ADL's Weight Management/Obesity;Improve shortness of breath with ADL's;Increase knowledge of respiratory medications and ability to use respiratory devices properly.;Heart Failure;Hypertension  Review Clarence Dawson has been managing his weight by watching what he is eating. He has cut down on portions of meals. He has also cut out pastas and most breads  other them his morning toast. He has also cut out alcohol. He has noticed improvment in his breathing when doing activities in town. He uses PLB when he feels SOB. Clarence Dawson is doing well in rehab.  He was out last week with a sinus infection.  Despite not eating, his weight is up.  He has not been as active this week and drinking more water so he may be retaining fluid.  He is good about using his PLB to help with his breathing.  He was doing better with his breathing before he got sick.  His pressures are doing well.  He had breathing test yesterday and is going to ENT today.      Expected Outcomes Short term: Continue to manage waright   long term: continue to exercise for happiness Short: Get back to routine exercise Long: conitnue to monitor risk factors               Core Components/Risk Factors/Patient Goals at Discharge (Final Review):   Goals and Risk Factor Review - 10/21/23 0820       Core Components/Risk Factors/Patient Goals Review   Personal Goals Review Weight Management/Obesity;Improve shortness of breath with ADL's;Increase knowledge of respiratory medications and ability to use respiratory devices properly.;Heart Failure;Hypertension    Review Clarence Dawson is doing well in rehab.  He was out last week with a sinus infection.  Despite not eating, his weight is up.  He has not been as active this week and drinking more water so he may be retaining fluid.  He is good about using his PLB to help with his breathing.  He was doing better with his breathing before he got sick.  His pressures are doing well.  He had breathing test yesterday and is going to ENT today.    Expected Outcomes Short: Get back to routine exercise Long: conitnue to monitor risk factors             ITP Comments:  ITP Comments     Row Name 09/09/23 0750 10/07/23 0639 11/04/23 0646       ITP Comments 30 day review completed. ITP sent to Dr.Jehanzeb Memon, Medical Director of  Pulmonary Rehab. Continue with ITP unless  changes are made by physician.  New to program. 30 day review completed. ITP sent to Dr.Jehanzeb Memon, Medical Director of  Pulmonary Rehab. Continue with ITP unless changes are made by physician. 30 day review completed. ITP sent to Dr.Jehanzeb Memon, Medical Director of  Pulmonary Rehab. Continue with ITP unless changes are made by physician.              Comments: 30 day review

## 2023-11-04 NOTE — Progress Notes (Signed)
Daily Session Note  Patient Details  Name: Clarence Dawson MRN: [id number] Date of Birth: 1946/04/10 Referring Provider:   Flowsheet Row PULMONARY REHAB OTHER RESP ORIENTATION from 09/03/2023 in [facility]  Referring Provider Marca Ancona MD       Encounter Date: 11/04/2023  Check In:  Session Check In - 11/04/23 0745       Check-In   Supervising physician immediately available to respond to emergencies See telemetry face sheet for immediately available MD    Location [facility]    Staff Present Ross Ludwig, BS, Exercise Physiologist;Other    Virtual Visit No    Medication changes reported     No    Fall or balance concerns reported    No    Tobacco Cessation No Change    Warm-up and Cool-down Performed on first and last piece of equipment    Resistance Training Performed Yes    VAD Patient? No    PAD/SET Patient? No      Pain Assessment   Currently in Pain? No/denies    Pain Score 0-No pain    Multiple Pain Sites No             Capillary Blood Glucose: No results found for this or any previous visit (from the past 24 hour(s)).    Social History   Tobacco Use  Smoking Status Former   Current packs/day: 0.00   Average packs/day: 3.0 packs/day for 26.0 years (77.9 ttl pk-yrs)   Types: Cigarettes   Start date: 02/20/1958   Quit date: 02/12/1984   Years since quitting: 39.7  Smokeless Tobacco Former   Types: Chew  Tobacco Comments   Former smoker 08/30/2021    Goals Met:  Independence with exercise equipment Using PLB without cueing & demonstrates good technique Exercise tolerated well No report of concerns or symptoms today Strength training completed today  Goals Unmet:  Not Applicable  Comments: Pt able to follow exercise prescription today without complaint.  Will continue to monitor for progression.

## 2023-11-06 ENCOUNTER — Other Ambulatory Visit (HOSPITAL_COMMUNITY): Payer: Self-pay

## 2023-11-06 ENCOUNTER — Other Ambulatory Visit (HOSPITAL_COMMUNITY): Payer: Self-pay | Admitting: Pharmacy Technician

## 2023-11-06 ENCOUNTER — Encounter (HOSPITAL_COMMUNITY)
Admission: RE | Admit: 2023-11-06 | Discharge: 2023-11-06 | Disposition: A | Discharge status: Home / Self Care | Payer: Medicare Other | Source: Ambulatory Visit | Attending: Cardiology

## 2023-11-06 ENCOUNTER — Other Ambulatory Visit: Payer: Self-pay | Admitting: Pulmonary Disease

## 2023-11-06 DIAGNOSIS — I272 Pulmonary hypertension, unspecified: Secondary | ICD-10-CM

## 2023-11-06 DIAGNOSIS — J4550 Severe persistent asthma, uncomplicated: Secondary | ICD-10-CM

## 2023-11-06 NOTE — Progress Notes (Signed)
Daily Session Note  Patient Details  Name: MARQUISE CARITHERS MRN: [id number] Date of Birth: 1946-09-14 Referring Provider:   Flowsheet Row PULMONARY REHAB OTHER RESP ORIENTATION from 09/03/2023 in [facility]  Referring Provider Marca Ancona MD       Encounter Date: 11/06/2023  Check In:  Session Check In - 11/06/23 0807       Check-In   Supervising physician immediately available to respond to emergencies See telemetry face sheet for immediately available MD    Location [facility]    Staff Present Rodena Medin, RN, BSN;Hason Ofarrell Juanetta Gosling, MA, RCEP, CCRP, CCET;Other   Ulanda Edison RN   Virtual Visit No    Medication changes reported     No    Warm-up and Cool-down Performed on first and last piece of equipment    Resistance Training Performed Yes    VAD Patient? No    PAD/SET Patient? No      Pain Assessment   Currently in Pain? No/denies             Capillary Blood Glucose: No results found for this or any previous visit (from the past 24 hour(s)).    Social History   Tobacco Use  Smoking Status Former   Current packs/day: 0.00   Average packs/day: 3.0 packs/day for 26.0 years (77.9 ttl pk-yrs)   Types: Cigarettes   Start date: 02/20/1958   Quit date: 02/12/1984   Years since quitting: 39.7  Smokeless Tobacco Former   Types: Chew  Tobacco Comments   Former smoker 08/30/2021    Goals Met:  Proper associated with RPD/PD & O2 Sat Independence with exercise equipment Using PLB without cueing & demonstrates good technique Exercise tolerated well No report of concerns or symptoms today Strength training completed today  Goals Unmet:  Not Applicable  Comments: Pt able to follow exercise prescription today without complaint.  Will continue to monitor for progression.

## 2023-11-06 NOTE — Progress Notes (Signed)
Specialty Pharmacy Refill Coordination Note  Clarence Dawson is a 77 y.o. male contacted today regarding refills of specialty medication(s) Dupilumab   Patient requested Clarence Dawson at [facility] at [place] date: 11/16/23   Medication will be filled on 11/15/23.  Refill Request sent to MD; Call if any delays.  *also confirmed with patient  that last fill from 09/2023 was picked up. It was just not rung out the register.*

## 2023-11-09 ENCOUNTER — Encounter (HOSPITAL_COMMUNITY): Payer: Medicare Other

## 2023-11-09 ENCOUNTER — Other Ambulatory Visit: Payer: Self-pay

## 2023-11-09 MED ORDER — DUPIXENT 300 MG/2ML ~~LOC~~ SOAJ
300.0000 mg | SUBCUTANEOUS | 1 refills | Status: DC
  Filled 2023-11-09: qty 4, 28d supply, fill #0
  Filled 2023-12-04: qty 4, 28d supply, fill #1
  Filled 2024-01-05: qty 4, 28d supply, fill #2
  Filled 2024-02-02: qty 4, 28d supply, fill #3
  Filled 2024-03-03: qty 4, 28d supply, fill #4
  Filled 2024-03-24: qty 4, 28d supply, fill #5

## 2023-11-10 ENCOUNTER — Encounter (INDEPENDENT_AMBULATORY_CARE_PROVIDER_SITE_OTHER): Payer: Self-pay

## 2023-11-10 ENCOUNTER — Ambulatory Visit (INDEPENDENT_AMBULATORY_CARE_PROVIDER_SITE_OTHER): Payer: Medicare Other | Admitting: Otolaryngology

## 2023-11-10 VITALS — Ht 69.0 in | Wt 228.0 lb

## 2023-11-10 DIAGNOSIS — J343 Hypertrophy of nasal turbinates: Secondary | ICD-10-CM | POA: Diagnosis not present

## 2023-11-10 DIAGNOSIS — J329 Chronic sinusitis, unspecified: Secondary | ICD-10-CM

## 2023-11-10 DIAGNOSIS — J324 Chronic pansinusitis: Secondary | ICD-10-CM

## 2023-11-10 DIAGNOSIS — J342 Deviated nasal septum: Secondary | ICD-10-CM

## 2023-11-10 MED ORDER — AMOXICILLIN-POT CLAVULANATE 875-125 MG PO TABS: 1.0000 | ORAL_TABLET | Freq: Two times a day (BID) | ORAL | 0 refills | Status: AC

## 2023-11-10 NOTE — Progress Notes (Addendum)
Dear Dr. Ouida Sills, Here is my assessment for our mutual patient, Clarence Dawson. Thank you for allowing me the opportunity to care for your patient. Please do not hesitate to contact me should you have any other questions. Sincerely, Dr. Jovita Kussmaul  Otolaryngology Clinic Note Referring provider: Dr. Ouida Sills HPI:  Clarence Dawson is a 77 y.o. male kindly referred by Dr. Ouida Sills for evaluation of sinusitis.  Initial visit: He reports that he started to have some acute onset RIGHT facial pressure/pain on 10/14. He contacted his PCP but he got worse, and he really had significant amount of worsening on 10/16 with facial swelling and then we went to the ED. He was having right facial pain, pressure, and pain. Some headache and left facial pain as well. No fevers. No anterior rhinorrhea, no post nasal drip. Normal sense of smell. No nasal congestion. No facial swelling or numbness. 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  No vomiting.   He was prescribed Clindamycin 300 TID x2 weeks, and CT was done. He reports today that he is doing "a lot better" - almost back to normal. He says the pain and discomfort is much improved, some crackling sensation but he does report some maxillary sinus pressure and head feels a "bit heavy". No tearing or dry eye symptoms or vision changes or facial numbness.  He reports that he gets a sinus infection about once a year with similar symptoms, and he gets antibiotics which improve his symptoms. He has never had anything as severe as he had just now.  No itchy eyes, runny nose, or typical AR symptoms but he is on dupixent. Does have asthma  He does use intermittent nasal rinses. Does not use flonase. Does not use Antihistamine - can't tolerate. He is on xarelto.  We did place him on maximal medical management, including antibiotics and steroids and saline rinses.  Today (11/10/23) He reports that on Saturday, he started to have some green drainage coming out of  the right side of the nose. He reports that he had mild pressure, but otherwise reports that he did not have pain, hyposmia, numbness, or any eye related symptoms or swelling. He said that he took some Augmentin he had left over, and bumped up his rinses and now doing better - less drainage, no pain. He wishes to rule out any infection today.  H&N Surgery: no Personal or FHx of bleeding dz or anesthesia difficulty: no  On 4L O2 at baseline He does have DM - metformin. Never had anything over 150. No AI disease, no lymphoma. Besides dupixent, no DMARDs  PMHx: extensive - including paroxysmal A-fib, COPD on 4L, Bronchiectasis, CHF, Pulmonary HTN on BIPAP at night, Sick sinus with a PPM  Independent Review of Additional Tests or Records:  CT Face 10/16: independently reviewed, right nasal septal deviation, right sided pansinus opacification with some L ethmoid and sphenoid disease. Clearly some stranding over anterior and lateral buttress, but retroantral fat pad appears symmetric (though no contrast). Query intersinus septum of sphenoid erosion - but not clear since prior CT Head also may have had a dehiscence. R nasolacrimal with some asymmetry, but no erosion. No dual density to suggest fungal etiology. Official read queries fungal sinusitis but it does not appear significantly likely     Prior CT Head Nov 2020: clear sinuses, do still think intersinus sphenoid septum may have a dehiscence but cuts thick (5mm)    Labs: CBC 10/14/23: WBC 7.3 ANC 5.5K/uL  BMP 10/14/2023: Glc 133  PMH/Meds/All/SocHx/FamHx/ROS:   Past Medical History:  Diagnosis Date   Allergic rhinitis    Aortic valve disorder    Asthma    since childhood- seasonal allergies induced   Cancer (HCC)    Skin cancer- squamous, basal   Carotid artery stenosis    Essential hypertension    Full dentures    GERD (gastroesophageal reflux disease)    H/O hiatal hernia    Hemorrhage of rectum    Hyperlipidemia     Hypothyroidism    Male circumcision    OSA (obstructive sleep apnea)    Osteoarthritis    Pacemaker    Oct 2005 in [place].   PAF (paroxysmal atrial fibrillation) (HCC)    Pneumonia    "several Times" 2015 last time   Primary localized osteoarthritis of right knee 08/11/2017   RBBB (right bundle branch block)    Sinoatrial node dysfunction (HCC)    Syncope    Tricuspid valve disorder    Type 2 diabetes mellitus (HCC)    Type II     Past Surgical History:  Procedure Laterality Date   A-V CARDIAC PACEMAKER INSERTION     Sick sinus syndrome DDR pacer   Arthropathy Right 2005   Rebuilding of left thumb and joint    CARDIAC CATHETERIZATION     CARDIAC ELECTROPHYSIOLOGY STUDY AND ABLATION  09/2008   for pvcs, Dr. Vesta Mixer   CARDIOVERSION N/A 12/18/2016   Procedure: CARDIOVERSION;  Surgeon: Chrystie Nose, MD;  Location: [facility] ENDOSCOPY;  Service: Cardiovascular;  Laterality: N/A;   CARDIOVERSION N/A 09/05/2021   Procedure: CARDIOVERSION;  Surgeon: Sande Rives, MD;  Location: [facility] ENDOSCOPY;  Service: Cardiovascular;  Laterality: N/A;   CARDIOVERSION N/A 08/06/2022   Procedure: CARDIOVERSION;  Surgeon: Little Ishikawa, MD;  Location: [facility] ENDOSCOPY;  Service: Cardiovascular;  Laterality: N/A;   CARPAL TUNNEL RELEASE  1994   right wrist   CARPAL TUNNEL RELEASE  05/04/2012   Procedure: CARPAL TUNNEL RELEASE;  Surgeon: Nicki Reaper, MD;  Location: [facility];  Service: Orthopedics;  Laterality: Left;   CARPOMETACARPEL SUSPENSION PLASTY Right 11/16/2014   Procedure: SUSPENSIONPLASTY RIGHT THUMB TENDON TRANSFER ABDUCTOR POLLICUS LONGUS EXCISION TRAPEZIUM;  Surgeon: Cindee Salt, MD;  Location: [facility];  Service: Orthopedics;  Laterality: Right;   CHOLECYSTECTOMY  1994   CIRCUMCISION     COLONOSCOPY N/A 03/14/2013   Procedure: COLONOSCOPY;  Surgeon: Corbin Ade, MD;  Location: [facility];  Service: Endoscopy;  Laterality: N/A;  8:15 AM   EYE  SURGERY     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-[facility]   EYE SURGERY  2012   Left eye Corneal transplant- partial- Cataract   FLEXIBLE BRONCHOSCOPY Right 06/23/2019   Procedure: FLEXIBLE BRONCHOSCOPY RIGHT;  Surgeon: Shane Crutch, MD;  Location: [facility] ORS;  Service: Pulmonary;  Laterality: Right;   GALLBLADDER SURGERY  12/01/2006   HAND TENDON SURGERY Left late 1990's   thumb   HEMORROIDECTOMY  2003   INJECTION KNEE Left 08/26/2017   Procedure: LEFT KNEE INJECTION;  Surgeon: Salvatore Marvel, MD;  Location: [facility] OR;  Service: Orthopedics;  Laterality: Left;   left Knee Arthroscopy     [facility]   PARTIAL KNEE ARTHROPLASTY  11/22/2012   Procedure: UNICOMPARTMENTAL KNEE;  Surgeon: Nilda Simmer, MD;  Location: [facility] OR;  Service: Orthopedics;  Laterality: Left;  left unicompartmental knee arthroplasty   PERMANENT PACEMAKER GENERATOR CHANGE N/A 01/12/2013   Procedure: PERMANENT PACEMAKER  GENERATOR CHANGE;  Surgeon: Marinus Maw, MD;  Location: [facility] CATH LAB;  Service: Cardiovascular;  Laterality: N/A;   PPM GENERATOR CHANGEOUT N/A 07/07/2022   Procedure: PPM GENERATOR CHANGEOUT;  Surgeon: Marinus Maw, MD;  Location: [facility] INVASIVE CV LAB;  Service: Cardiovascular;  Laterality: N/A;   RIGHT/LEFT HEART CATH AND CORONARY ANGIOGRAPHY N/A 08/28/2023   Procedure: RIGHT/LEFT HEART CATH AND CORONARY ANGIOGRAPHY;  Surgeon: Laurey Morale, MD;  Location: [facility] INVASIVE CV LAB;  Service: Cardiovascular;  Laterality: N/A;   Rotator cuff Surgery  2001   Right shoulder   TONSILLECTOMY     TOTAL KNEE ARTHROPLASTY Right 08/26/2017   Procedure: TOTAL KNEE ARTHROPLASTY;  Surgeon: Salvatore Marvel, MD;  Location: [facility] OR;  Service: Orthopedics;  Laterality: Right;   varicose vein reduction      Family History  Problem Relation Age of Onset   Liver cancer Mother    Pancreatic cancer Mother    Colon cancer Mother    Hypertension Father    Stroke Father    Other Father 56       Sudden  Cardiac death   Heart attack Father    Cancer Sister        brain   Diabetes Sister    Colon cancer Maternal Aunt    Colon polyps Neg Hx      Social Connections: Not on file     Current Outpatient Medications:    acetaminophen (TYLENOL) 500 MG tablet, Take 1,000 mg by mouth 3 (three) times daily., Disp: , Rfl:    albuterol (PROVENTIL) (2.5 MG/3ML) 0.083% nebulizer solution, TAKE 1 VIAL VIA NEBULIZATION EVERY 6 HOURS AS NEEDED FOR WHEEZING OR SHORTNESS OF BREATH., Disp: 90 mL, Rfl: 2   albuterol (VENTOLIN HFA) 108 (90 Base) MCG/ACT inhaler, Inhale 2 puffs into the lungs every 4 (four) hours as needed for shortness of breath (only if you can't catch your breath/ asthma)., Disp: 18 g, Rfl: 5   atorvastatin (LIPITOR) 20 MG tablet, Take 20 mg by mouth at bedtime., Disp: , Rfl:    bisoprolol (ZEBETA) 5 MG tablet, TAKE 1 AND 1/2 TABLET BY MOUTH TWICE DAILY., Disp: 90 tablet, Rfl: 3   BREZTRI AEROSPHERE 160-9-4.8 MCG/ACT AERO, INHALE 2 PUFFS INTO THE LUNGS TWICE DAILY., Disp: 10.7 g, Rfl: 5   Carboxymethylcellul-Glycerin (LUBRICATING EYE DROPS OP), Place 1 drop into the right eye 2 (two) times a week. Clear eyes, Disp: , Rfl:    diltiazem (CARTIA XT) 120 MG 24 hr capsule, TAKE (1) CAPSULE BY MOUTH DAILY, MAY TAKE A EXTRA CAPSULE DAILY FOR BREAKTHROUGH AFIB., Disp: 180 capsule, Rfl: 3   dofetilide (TIKOSYN) 500 MCG capsule, Take 1 capsule (500 mcg total) by mouth 2 (two) times daily., Disp: 180 capsule, Rfl: 3   Dupilumab (DUPIXENT) 300 MG/2ML SOAJ, Inject 300 mg into the skin every 14 (fourteen) days., Disp: 12 mL, Rfl: 1   Ensifentrine (OHTUVAYRE) 3 MG/2.5ML SUSP, Take 3 mg by nebulization 2 (two) times daily., Disp: , Rfl:    EPINEPHrine 0.3 mg/0.3 mL IJ SOAJ injection, Inject 0.3 mg into the muscle as needed for anaphylaxis., Disp: 1 each, Rfl: 5   fluticasone (FLONASE) 50 MCG/ACT nasal spray, Place 2 sprays into both nostrils daily., Disp: 16 g, Rfl: 6   furosemide (LASIX) 40 MG tablet, Take 1  tablet (40 mg total) by mouth in the morning AND 0.5 tablets (20 mg total) every evening., Disp: 90 tablet, Rfl: 3   glipiZIDE (GLUCOTROL XL) 5 MG 24 hr  tablet, Take 5 mg by mouth daily before breakfast., Disp: , Rfl:    hydrocortisone cream 0.5 %, Apply 1 Application topically daily as needed for itching., Disp: , Rfl:    JARDIANCE 25 MG TABS tablet, Take 25 mg by mouth daily before breakfast., Disp: , Rfl:    levothyroxine (SYNTHROID) 150 MCG tablet, Take 150 mcg by mouth daily before breakfast., Disp: , Rfl:    losartan (COZAAR) 50 MG tablet, Take 50 mg by mouth at bedtime., Disp: , Rfl:    metFORMIN (GLUCOPHAGE-XR) 500 MG 24 hr tablet, Take 500 mg by mouth in the morning and at bedtime., Disp: , Rfl:    Multiple Vitamin (MULTIVITAMIN) tablet, Take 1 tablet by mouth daily., Disp: , Rfl:    omeprazole (PRILOSEC OTC) 20 MG tablet, Take 20 mg by mouth every morning., Disp: , Rfl:    oxyCODONE-acetaminophen (PERCOCET/ROXICET) 5-325 MG tablet, Take 1 every 6 hours for pain that is not helped by Tylenol or Motrin, Disp: 15 tablet, Rfl: 0   predniSONE (DELTASONE) 10 MG tablet, Take 1 tablet (10 mg total) by mouth daily with breakfast. Take 1 tablet by mouth daily for 5 days, then 1/2 tablet (5mg ) daily for 5 days., Disp: 8 tablet, Rfl: 0   PRESCRIPTION MEDICATION, Thumper vest, Disp: , Rfl:    Respiratory Therapy Supplies (FLUTTER) DEVI, Use as directed, Disp: 1 each, Rfl: 0   rivaroxaban (XARELTO) 20 MG TABS tablet, Take 20 mg by mouth at bedtime., Disp: , Rfl:    sodium chloride (OCEAN) 0.65 % SOLN nasal spray, Place 1 spray into both nostrils as needed for congestion., Disp: , Rfl:    Physical Exam:   There were no vitals taken for this visit.   Salient findings:  CN II-XII intact; no palatal numbness or discoloration; clearly sensate over V1-3; no facial swelling or erythema, EOM intact; no epiphora  Bilateral EAC clear and TM intact with well pneumatized middle ear spaces Anterior rhinoscopy:  Septum deviates right; bilateral inferior turbinates with modest hypertrophy; nasal endoscopy done given concern for sinusitis No lesions of oral cavity/oropharynx; edentulous maxilla; no palatal discoloration or numbness No obviously palpable neck masses/lymphadenopathy/thyromegaly No respiratory distress or stridor  Procedures:  PROCEDURE: Bilateral Diagnostic Rigid Nasal Endoscopy Pre-procedure diagnosis: Sinusitis Post-procedure diagnosis: same Indication: See pre-procedure diagnosis and physical exam above Complications: None apparent EBL: 0 mL Anesthesia: No anesthetic or decongestant administered for examination  Description of Procedure:  Patient was identified. A rigid 30 degree endoscope was utilized to evaluate the sinonasal cavities, mucosa, sinus ostia and turbinates and septum.  Overall, mild signs of mucosal inflammation are noted today.  No mucopurulence, polyps, or masses noted. Inferior and middle Turbinates and the nasal mucosa are overall pink, without significant pallor, and sensate to examination. No septal lesions. Also examined the inferior meatus area on both sides and there are no necrotic or fungal elements. Along the maxillary line on both sides, there is no significant obvious mucosal irregularity. No obvious evidence on endoscopy indicative of invasive fungal infection. Right Middle meatus: clear Right SE Recess: clear Left MM: clear Left SE Recess: clear I.e. no purulence  Prior (09/2023):     11/10/2023:   Photodocumentation was obtained.  CPT CODE -- 60454 - Mod 25   Impression & Plans:  Clarence Dawson is a 77 y.o. male with significant co-morbidities and on dupixent who presents for: Chronic sinusitis (right > left) - CT read reports concern for IFS but given significant improvement and no CN deficits,  did not think this is IFS but rather an episode of acute on chronic sinusitis. We decided to treat him medically and follow up. He has a CT  scheduled already but had an exacerbation this past weekend so returns for earlier follow up today.  - Endoscopy is again reassuring, so again do not think that this is IFS. He may have just started draining from the max, or has a small exacerbation. Given his medical comorbidities, will Rx with augmentin today Nasal septal deviation Bilateral inferior turbinate hypertrophy  Overall, He does have some intersphenoid sinus septum dehiscence (erosion?) and the lacrimal area is somewhat asymmetric on scan. Therefore, although improving, given his co-morbidities and severity of infection, will still get post treatment CT to ensure resolution. He does report having at least 1-2 infections/year and given his pulm status, would like to treat his sinuses to ensure that does not exacerbate his pulm pathology.  - Start augmentin BID x10 d (see below) - Will avoid Pred this time given h/o palpitations - Flonase 50 mcg 2 puffs BID x30d - Increase Neil Med sinus irrigations to BID - CT Brainlab scheduled - f/u in 2 weeks as scheduled - If persistent, can consider opening sinuses though he is not a great candidate given his co-morbidities. He may be potentially a candidate for max balloon sinuplasty or sphenoid since those are his major opacified areas if no improvement.  See below regarding exact medications prescribed this encounter including dosages and route: Meds ordered this encounter  Medications   amoxicillin-clavulanate (AUGMENTIN) 875-125 MG tablet    Sig: Take 1 tablet by mouth 2 (two) times daily for 10 days.    Dispense:  20 tablet    Refill:  0     Thank you for allowing me the opportunity to care for your patient. Please do not hesitate to contact me should you have any other questions.  Sincerely, Jovita Kussmaul, MD Otolarynoglogist (ENT), [facility] ENT Specialist Phone: [phone] Fax: [phone]  11/10/2023, 8:04 AM   MDM:  Level 4: 99214 - chronic problem with  exacerbation Complexity/Problems addressed: see above Data complexity: mod - Morbidity: mod - Prescription Drug prescribed or managed: yes (augmentin)

## 2023-11-11 ENCOUNTER — Encounter (HOSPITAL_COMMUNITY)
Admission: RE | Admit: 2023-11-11 | Discharge: 2023-11-11 | Disposition: A | Discharge status: Home / Self Care | Payer: Medicare Other | Source: Ambulatory Visit | Attending: Cardiology

## 2023-11-11 DIAGNOSIS — I272 Pulmonary hypertension, unspecified: Secondary | ICD-10-CM

## 2023-11-11 NOTE — Progress Notes (Signed)
Daily Session Note  Patient Details  Name: Clarence Dawson MRN: [id number] Date of Birth: [birth year] Referring Provider:   Flowsheet Row PULMONARY REHAB OTHER RESP ORIENTATION from 09/03/2023 in [facility] CARDIAC REHABILITATION  Referring Provider Marca Ancona MD       Encounter Date: 11/11/2023  Check In:  Session Check In - 11/11/23 0817       Check-In   Supervising physician immediately available to respond to emergencies See telemetry face sheet for immediately available MD    Location [facility]    Staff Present Ross Ludwig, BS, Exercise Physiologist;Quin Mcpherson Juanetta Gosling, MA, RCEP, CCRP, CCET;Other   Ula Lingo RN   Virtual Visit No    Medication changes reported     No    Fall or balance concerns reported    No    Warm-up and Cool-down Performed on first and last piece of equipment    Resistance Training Performed Yes    VAD Patient? No    PAD/SET Patient? No      Pain Assessment   Currently in Pain? No/denies             Capillary Blood Glucose: No results found for this or any previous visit (from the past 24 hour(s)).    Social History   Tobacco Use  Smoking Status Former   Current packs/day: 0.00   Average packs/day: 3.0 packs/day for 26.0 years (77.9 ttl pk-yrs)   Types: Cigarettes   Start date: 02/20/1958   Quit date: 02/12/1984   Years since quitting: 39.7  Smokeless Tobacco Former   Types: Chew  Tobacco Comments   Former smoker 08/30/2021    Goals Met:  Proper associated with RPD/PD & O2 Sat Independence with exercise equipment Using PLB without cueing & demonstrates good technique Exercise tolerated well No report of concerns or symptoms today Strength training completed today  Goals Unmet:  Not Applicable  Comments: Pt able to follow exercise prescription today without complaint.  Will continue to monitor for progression.

## 2023-11-13 ENCOUNTER — Encounter (HOSPITAL_COMMUNITY): Payer: Medicare Other

## 2023-11-13 ENCOUNTER — Ambulatory Visit (INDEPENDENT_AMBULATORY_CARE_PROVIDER_SITE_OTHER): Payer: Medicare Other

## 2023-11-13 ENCOUNTER — Other Ambulatory Visit: Payer: Self-pay

## 2023-11-13 DIAGNOSIS — I495 Sick sinus syndrome: Secondary | ICD-10-CM | POA: Diagnosis not present

## 2023-11-14 LAB — CUP PACEART REMOTE DEVICE CHECK
Battery Remaining Longevity: 94 mo
Battery Remaining Percentage: 90 %
Battery Voltage: 2.99 V
Brady Statistic AP VP Percent: 3.3 %
Brady Statistic AP VS Percent: 89 %
Brady Statistic AS VP Percent: 1 %
Brady Statistic AS VS Percent: 6.4 %
Brady Statistic RA Percent Paced: 79 %
Brady Statistic RV Percent Paced: 11 %
Date Time Interrogation Session: 20241115034311
Implantable Lead Connection Status: 753985
Implantable Lead Connection Status: 753985
Implantable Lead Implant Date: 20051106
Implantable Lead Implant Date: 20051106
Implantable Lead Location: 753859
Implantable Lead Location: 753860
Implantable Pulse Generator Implant Date: 20230710
Lead Channel Impedance Value: 350 Ohm
Lead Channel Impedance Value: 650 Ohm
Lead Channel Pacing Threshold Amplitude: 0.5 V
Lead Channel Pacing Threshold Amplitude: 0.75 V
Lead Channel Pacing Threshold Pulse Width: 0.5 ms
Lead Channel Pacing Threshold Pulse Width: 0.5 ms
Lead Channel Sensing Intrinsic Amplitude: 12 mV
Lead Channel Sensing Intrinsic Amplitude: 2.8 mV
Lead Channel Setting Pacing Amplitude: 2 V
Lead Channel Setting Pacing Amplitude: 2.5 V
Lead Channel Setting Pacing Pulse Width: 0.5 ms
Lead Channel Setting Sensing Sensitivity: 2 mV
Pulse Gen Model: 2272
Pulse Gen Serial Number: 8099041

## 2023-11-16 ENCOUNTER — Encounter (HOSPITAL_COMMUNITY)
Admission: RE | Admit: 2023-11-16 | Discharge: 2023-11-16 | Disposition: A | Discharge status: Home / Self Care | Payer: Medicare Other | Source: Ambulatory Visit | Attending: Cardiology

## 2023-11-16 ENCOUNTER — Other Ambulatory Visit (HOSPITAL_COMMUNITY): Payer: Self-pay

## 2023-11-16 ENCOUNTER — Other Ambulatory Visit: Payer: Self-pay

## 2023-11-16 DIAGNOSIS — I272 Pulmonary hypertension, unspecified: Secondary | ICD-10-CM | POA: Diagnosis not present

## 2023-11-16 MED ORDER — ALBUTEROL SULFATE (2.5 MG/3ML) 0.083% IN NEBU: 2.5000 mg | INHALATION_SOLUTION | Freq: Four times a day (QID) | RESPIRATORY_TRACT | 6 refills | Status: DC | PRN

## 2023-11-16 NOTE — Progress Notes (Signed)
Received fax from [facility] asking for a refill on the patient's Albuterol nebulizer solution.   I have sent in the refill. Nothing further needed.

## 2023-11-16 NOTE — Progress Notes (Signed)
Daily Session Note  Patient Details  Name: Clarence Dawson MRN: [id number] Date of Birth: [birth year] Referring Provider:   Flowsheet Row PULMONARY REHAB OTHER RESP ORIENTATION from 09/03/2023 in [facility] CARDIAC REHABILITATION  Referring Provider Marca Ancona MD       Encounter Date: 11/16/2023  Check In:  Session Check In - 11/16/23 0745       Check-In   Supervising physician immediately available to respond to emergencies See telemetry face sheet for immediately available MD    Location [facility]    Staff Present Ross Ludwig, BS, Exercise Physiologist;Jessica Juanetta Gosling, MA, RCEP, CCRP, CCET    Virtual Visit No    Medication changes reported     Yes    Comments nebulizer othuuayre 3mg     Fall or balance concerns reported    No    Tobacco Cessation No Change    Warm-up and Cool-down Performed on first and last piece of equipment    Resistance Training Performed Yes    VAD Patient? No    PAD/SET Patient? No      Pain Assessment   Currently in Pain? No/denies    Multiple Pain Sites No             Capillary Blood Glucose: No results found for this or any previous visit (from the past 24 hour(s)).    Social History   Tobacco Use  Smoking Status Former   Current packs/day: 0.00   Average packs/day: 3.0 packs/day for 26.0 years (77.9 ttl pk-yrs)   Types: Cigarettes   Start date: 02/20/1958   Quit date: 02/12/1984   Years since quitting: 39.7  Smokeless Tobacco Former   Types: Chew  Tobacco Comments   Former smoker 08/30/2021    Goals Met:  Independence with exercise equipment Exercise tolerated well No report of concerns or symptoms today Strength training completed today  Goals Unmet:  Not Applicable  Comments: Pt able to follow exercise prescription today without complaint.  Will continue to monitor for progression.

## 2023-11-17 ENCOUNTER — Encounter: Payer: Self-pay | Admitting: Pulmonary Disease

## 2023-11-17 NOTE — Telephone Encounter (Signed)
He can stop the albuterol and just do it as needed however, if he needs to use it he can then use the [facility] after the albuterol.

## 2023-11-18 ENCOUNTER — Encounter (HOSPITAL_COMMUNITY)
Admission: RE | Admit: 2023-11-18 | Discharge: 2023-11-18 | Disposition: A | Discharge status: Home / Self Care | Payer: Medicare Other | Source: Ambulatory Visit | Attending: Cardiology

## 2023-11-18 DIAGNOSIS — I272 Pulmonary hypertension, unspecified: Secondary | ICD-10-CM | POA: Diagnosis not present

## 2023-11-18 NOTE — Progress Notes (Signed)
Daily Session Note  Patient Details  Name: JHOVANY TWISDALE MRN: [id number] Date of Birth: [birth year] Referring Provider:   Flowsheet Row PULMONARY REHAB OTHER RESP ORIENTATION from 09/03/2023 in [facility] CARDIAC REHABILITATION  Referring Provider Marca Ancona MD       Encounter Date: 11/18/2023  Check In:  Session Check In - 11/18/23 0745       Check-In   Supervising physician immediately available to respond to emergencies See telemetry face sheet for immediately available MD    Location [facility]    Staff Present Other;Elin Fenley Fredric Mare, BS, Exercise Physiologist    Virtual Visit No    Medication changes reported     No    Fall or balance concerns reported    No    Tobacco Cessation No Change    Warm-up and Cool-down Performed on first and last piece of equipment    Resistance Training Performed Yes    VAD Patient? No    PAD/SET Patient? No      Pain Assessment   Currently in Pain? No/denies    Pain Score 0-No pain    Multiple Pain Sites No             Capillary Blood Glucose: No results found for this or any previous visit (from the past 24 hour(s)).    Social History   Tobacco Use  Smoking Status Former   Current packs/day: 0.00   Average packs/day: 3.0 packs/day for 26.0 years (77.9 ttl pk-yrs)   Types: Cigarettes   Start date: 02/20/1958   Quit date: 02/12/1984   Years since quitting: 39.7  Smokeless Tobacco Former   Types: Chew  Tobacco Comments   Former smoker 08/30/2021    Goals Met:  Independence with exercise equipment Using PLB without cueing & demonstrates good technique Exercise tolerated well No report of concerns or symptoms today Strength training completed today  Goals Unmet:  Not Applicable  Comments: Pt able to follow exercise prescription today without complaint.  Will continue to monitor for progression.

## 2023-11-20 ENCOUNTER — Encounter (HOSPITAL_COMMUNITY)
Admission: RE | Admit: 2023-11-20 | Discharge: 2023-11-20 | Disposition: A | Discharge status: Home / Self Care | Payer: Medicare Other | Source: Ambulatory Visit | Attending: Cardiology | Admitting: Cardiology

## 2023-11-20 ENCOUNTER — Ambulatory Visit (HOSPITAL_COMMUNITY)
Admission: RE | Admit: 2023-11-20 | Discharge: 2023-11-20 | Disposition: A | Discharge status: Home / Self Care | Payer: Medicare Other | Source: Ambulatory Visit | Attending: Otolaryngology

## 2023-11-20 ENCOUNTER — Encounter: Payer: Self-pay | Admitting: Internal Medicine

## 2023-11-20 ENCOUNTER — Encounter (INDEPENDENT_AMBULATORY_CARE_PROVIDER_SITE_OTHER): Payer: Medicare Other | Admitting: Internal Medicine

## 2023-11-20 ENCOUNTER — Other Ambulatory Visit (HOSPITAL_COMMUNITY): Payer: Medicare Other

## 2023-11-20 VITALS — BP 156/78 | HR 66 | Ht 69.0 in | Wt 229.0 lb

## 2023-11-20 DIAGNOSIS — I4819 Other persistent atrial fibrillation: Secondary | ICD-10-CM | POA: Diagnosis not present

## 2023-11-20 DIAGNOSIS — J324 Chronic pansinusitis: Secondary | ICD-10-CM | POA: Insufficient documentation

## 2023-11-20 DIAGNOSIS — I272 Pulmonary hypertension, unspecified: Secondary | ICD-10-CM | POA: Diagnosis not present

## 2023-11-20 DIAGNOSIS — I442 Atrioventricular block, complete: Secondary | ICD-10-CM

## 2023-11-20 LAB — CUP PACEART INCLINIC DEVICE CHECK
Battery Remaining Longevity: 96 mo
Battery Voltage: 2.99 V
Brady Statistic RA Percent Paced: 79 %
Brady Statistic RV Percent Paced: 10 %
Date Time Interrogation Session: 20241122162929
Implantable Lead Connection Status: 753985
Implantable Lead Connection Status: 753985
Implantable Lead Implant Date: 20051106
Implantable Lead Implant Date: 20051106
Implantable Lead Location: 753859
Implantable Lead Location: 753860
Implantable Pulse Generator Implant Date: 20230710
Lead Channel Impedance Value: 362.5 Ohm
Lead Channel Impedance Value: 650 Ohm
Lead Channel Pacing Threshold Amplitude: 0.75 V
Lead Channel Pacing Threshold Amplitude: 0.75 V
Lead Channel Pacing Threshold Amplitude: 1 V
Lead Channel Pacing Threshold Amplitude: 1 V
Lead Channel Pacing Threshold Pulse Width: 0.5 ms
Lead Channel Pacing Threshold Pulse Width: 0.5 ms
Lead Channel Pacing Threshold Pulse Width: 0.5 ms
Lead Channel Pacing Threshold Pulse Width: 0.5 ms
Lead Channel Sensing Intrinsic Amplitude: 12 mV
Lead Channel Sensing Intrinsic Amplitude: 2.3 mV
Lead Channel Setting Pacing Amplitude: 2 V
Lead Channel Setting Pacing Amplitude: 2.5 V
Lead Channel Setting Pacing Pulse Width: 0.5 ms
Lead Channel Setting Sensing Sensitivity: 2 mV
Pulse Gen Model: 2272
Pulse Gen Serial Number: 8099041

## 2023-11-20 NOTE — Patient Instructions (Signed)

## 2023-11-20 NOTE — Progress Notes (Signed)
HPI [name] returns today for followup. He is a pleasant 77 yo man with a h/o sinus node dysfunction, s/p PPM insertion, and PAF. He had sepsis and developed recurrent atrial fib. He has not had any bleeding. On flecainide, he had maintained NSR but has recently developed atrial fib for the past 3 months. He may be a little more sob. No significant palpitations. He has chronic dyspnea with exertion and oxygen dependent COPD. He is on 4 liters.  Allergies  Allergen Reactions   Food Anaphylaxis and Shortness Of Breath    TREE NUTS   Iodinated Contrast Media Hives and Shortness Of Breath    Patient states hives to throat closing. (01/15/17: patient states this reaction was "about 20 years ago" with possibly an IVP.  He now says high doses of prednisone "throw me into AFib."  He has tolerated CT arthrograms with Benadrly 50mg  PO one hour before injection.  Clarence Sievert, RN)   Shellfish Allergy Anaphylaxis and Shortness Of Breath    To shellfish, crabs.  Makes him feel like "things are crawling all over" me.  Denies airway issues with these foods.  Clarence Sievert, RN 01/15/17)   Goat-Derived Products Hives    GOAT CHEESE    Prednisone Palpitations    PRECIPITATES A-FIB   Diclofenac Sodium Other (See Comments)    Hives, "buggy feeling all over"   Metformin And Related Diarrhea    High doses at once   Vancomycin Anxiety    Red man syndrome   Voltaren [Diclofenac Sodium] Other (See Comments)    Feels like things are crawling on him     Current Outpatient Medications  Medication Sig Dispense Refill   acetaminophen (TYLENOL) 500 MG tablet Take 1,000 mg by mouth 3 (three) times daily.     albuterol (PROVENTIL) (2.5 MG/3ML) 0.083% nebulizer solution Take 3 mLs (2.5 mg total) by nebulization every 6 (six) hours as needed for wheezing or shortness of breath. 90 mL 6   albuterol (VENTOLIN HFA) 108 (90 Base) MCG/ACT inhaler Inhale 2 puffs into the lungs every 4 (four) hours as needed for  shortness of breath (only if you can't catch your breath/ asthma). 18 g 5   amoxicillin-clavulanate (AUGMENTIN) 875-125 MG tablet Take 1 tablet by mouth 2 (two) times daily for 10 days. 20 tablet 0   atorvastatin (LIPITOR) 20 MG tablet Take 20 mg by mouth at bedtime.     bisoprolol (ZEBETA) 5 MG tablet TAKE 1 AND 1/2 TABLET BY MOUTH TWICE DAILY. 90 tablet 3   BREZTRI AEROSPHERE 160-9-4.8 MCG/ACT AERO INHALE 2 PUFFS INTO THE LUNGS TWICE DAILY. 10.7 g 5   Carboxymethylcellul-Glycerin (LUBRICATING EYE DROPS OP) Place 1 drop into the right eye 2 (two) times a week. Clear eyes     diltiazem (CARTIA XT) 120 MG 24 hr capsule TAKE (1) CAPSULE BY MOUTH DAILY, MAY TAKE A EXTRA CAPSULE DAILY FOR BREAKTHROUGH AFIB. 180 capsule 3   dofetilide (TIKOSYN) 500 MCG capsule Take 1 capsule (500 mcg total) by mouth 2 (two) times daily. 180 capsule 3   Dupilumab (DUPIXENT) 300 MG/2ML SOAJ Inject 300 mg into the skin every 14 (fourteen) days. 12 mL 1   Ensifentrine (OHTUVAYRE) 3 MG/2.5ML SUSP Take 3 mg by nebulization 2 (two) times daily.     EPINEPHrine 0.3 mg/0.3 mL IJ SOAJ injection Inject 0.3 mg into the muscle as needed for anaphylaxis. 1 each 5   fluticasone (FLONASE) 50 MCG/ACT nasal spray Place 2 sprays into  both nostrils daily. 16 g 6   furosemide (LASIX) 40 MG tablet Take 1 tablet (40 mg total) by mouth in the morning AND 0.5 tablets (20 mg total) every evening. 90 tablet 3   glipiZIDE (GLUCOTROL XL) 5 MG 24 hr tablet Take 5 mg by mouth daily before breakfast.     hydrocortisone cream 0.5 % Apply 1 Application topically daily as needed for itching.     JARDIANCE 25 MG TABS tablet Take 25 mg by mouth daily before breakfast.     levothyroxine (SYNTHROID) 150 MCG tablet Take 150 mcg by mouth daily before breakfast.     losartan (COZAAR) 50 MG tablet Take 50 mg by mouth at bedtime.     metFORMIN (GLUCOPHAGE-XR) 500 MG 24 hr tablet Take 500 mg by mouth in the morning and at bedtime.     Multiple Vitamin  (MULTIVITAMIN) tablet Take 1 tablet by mouth daily.     omeprazole (PRILOSEC OTC) 20 MG tablet Take 20 mg by mouth every morning.     oxyCODONE-acetaminophen (PERCOCET/ROXICET) 5-325 MG tablet Take 1 every 6 hours for pain that is not helped by Tylenol or Motrin 15 tablet 0   predniSONE (DELTASONE) 10 MG tablet Take 1 tablet (10 mg total) by mouth daily with breakfast. Take 1 tablet by mouth daily for 5 days, then 1/2 tablet (5mg ) daily for 5 days. 8 tablet 0   PRESCRIPTION MEDICATION Thumper vest     Respiratory Therapy Supplies (FLUTTER) DEVI Use as directed 1 each 0   rivaroxaban (XARELTO) 20 MG TABS tablet Take 20 mg by mouth at bedtime.     sodium chloride (OCEAN) 0.65 % SOLN nasal spray Place 1 spray into both nostrils as needed for congestion.     No current facility-administered medications for this visit.     Past Medical History:  Diagnosis Date   Allergic rhinitis    Aortic valve disorder    Asthma    since childhood- seasonal allergies induced   Cancer (HCC)    Skin cancer- squamous, basal   Carotid artery stenosis    Essential hypertension    Full dentures    GERD (gastroesophageal reflux disease)    H/O hiatal hernia    Hemorrhage of rectum    Hyperlipidemia    Hypothyroidism    Male circumcision    OSA (obstructive sleep apnea)    Osteoarthritis    Pacemaker    Oct 2005 in [place].   PAF (paroxysmal atrial fibrillation) (HCC)    Pneumonia    "several Times" 2015 last time   Primary localized osteoarthritis of right knee 08/11/2017   RBBB (right bundle branch block)    Sinoatrial node dysfunction (HCC)    Syncope    Tricuspid valve disorder    Type 2 diabetes mellitus (HCC)    Type II    ROS:   All systems reviewed and negative except as noted in the HPI.   Past Surgical History:  Procedure Laterality Date   A-V CARDIAC PACEMAKER INSERTION     Sick sinus syndrome DDR pacer   Arthropathy Right 2005   Rebuilding of left thumb and joint    CARDIAC  CATHETERIZATION     CARDIAC ELECTROPHYSIOLOGY STUDY AND ABLATION  09/2008   for pvcs, Dr. Vesta Dawson   CARDIOVERSION N/A 12/18/2016   Procedure: CARDIOVERSION;  Surgeon: Clarence Nose, MD;  Location: [facility] ENDOSCOPY;  Service: Cardiovascular;  Laterality: N/A;   CARDIOVERSION N/A 09/05/2021   Procedure: CARDIOVERSION;  Surgeon: Lennie Odor  Maisie Fus, MD;  Location: [facility] ENDOSCOPY;  Service: Cardiovascular;  Laterality: N/A;   CARDIOVERSION N/A 08/06/2022   Procedure: CARDIOVERSION;  Surgeon: Little Ishikawa, MD;  Location: [facility] ENDOSCOPY;  Service: Cardiovascular;  Laterality: N/A;   CARPAL TUNNEL RELEASE  1994   right wrist   CARPAL TUNNEL RELEASE  05/04/2012   Procedure: CARPAL TUNNEL RELEASE;  Surgeon: Nicki Reaper, MD;  Location: [facility];  Service: Orthopedics;  Laterality: Left;   CARPOMETACARPEL SUSPENSION PLASTY Right 11/16/2014   Procedure: SUSPENSIONPLASTY RIGHT THUMB TENDON TRANSFER ABDUCTOR POLLICUS LONGUS EXCISION TRAPEZIUM;  Surgeon: Cindee Salt, MD;  Location: [facility];  Service: Orthopedics;  Laterality: Right;   CHOLECYSTECTOMY  1994   CIRCUMCISION     COLONOSCOPY N/A 03/14/2013   Procedure: COLONOSCOPY;  Surgeon: Corbin Ade, MD;  [facility];  Service: Endoscopy;  Laterality: N/A;  8:15 AM   EYE SURGERY     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-[facility]   EYE SURGERY  2012   Left eye Corneal transplant- partial- Cataract   FLEXIBLE BRONCHOSCOPY Right 06/23/2019   Procedure: FLEXIBLE BRONCHOSCOPY RIGHT;  Surgeon: Shane Crutch, MD;  Location: [facility] ORS;  Service: Pulmonary;  Laterality: Right;   GALLBLADDER SURGERY  12/01/2006   HAND TENDON SURGERY Left late 1990's   thumb   HEMORROIDECTOMY  2003   INJECTION KNEE Left 08/26/2017   Procedure: LEFT KNEE INJECTION;  Surgeon: Salvatore Marvel, MD;  Location: [facility] OR;  Service: Orthopedics;  Laterality: Left;   left Knee Arthroscopy     [facility]   PARTIAL  KNEE ARTHROPLASTY  11/22/2012   Procedure: UNICOMPARTMENTAL KNEE;  Surgeon: Nilda Simmer, MD;  Location: [facility] OR;  Service: Orthopedics;  Laterality: Left;  left unicompartmental knee arthroplasty   PERMANENT PACEMAKER GENERATOR CHANGE N/A 01/12/2013   Procedure: PERMANENT PACEMAKER GENERATOR CHANGE;  Surgeon: Marinus Maw, MD;  Location: [facility] CATH LAB;  Service: Cardiovascular;  Laterality: N/A;   PPM GENERATOR CHANGEOUT N/A 07/07/2022   Procedure: PPM GENERATOR CHANGEOUT;  Surgeon: Marinus Maw, MD;  Location: [facility] INVASIVE CV LAB;  Service: Cardiovascular;  Laterality: N/A;   RIGHT/LEFT HEART CATH AND CORONARY ANGIOGRAPHY N/A 08/28/2023   Procedure: RIGHT/LEFT HEART CATH AND CORONARY ANGIOGRAPHY;  Surgeon: Laurey Morale, MD;  Location: [facility] INVASIVE CV LAB;  Service: Cardiovascular;  Laterality: N/A;   Rotator cuff Surgery  2001   Right shoulder   TONSILLECTOMY     TOTAL KNEE ARTHROPLASTY Right 08/26/2017   Procedure: TOTAL KNEE ARTHROPLASTY;  Surgeon: Salvatore Marvel, MD;  Location: [facility] OR;  Service: Orthopedics;  Laterality: Right;   varicose vein reduction       Family History  Problem Relation Age of Onset   Liver cancer Mother    Pancreatic cancer Mother    Colon cancer Mother    Hypertension Father    Stroke Father    Other Father 47       Sudden Cardiac death   Heart attack Father    Cancer Sister        brain   Diabetes Sister    Colon cancer Maternal Aunt    Colon polyps Neg Hx      Social History   Socioeconomic History   Marital status: Widowed    Spouse name: Not on file   Number of children: Not on file   Years of education: Not on file   Highest education level: Not on file  Occupational History  Not on file  Tobacco Use   Smoking status: Former    Current packs/day: 0.00    Average packs/day: 3.0 packs/day for 26.0 years (77.9 ttl pk-yrs)    Types: Cigarettes    Start date: 02/20/1958    Quit date: 02/12/1984    Years since quitting: 39.7   Smokeless  tobacco: Former    Types: Chew   Tobacco comments:    Former smoker 08/30/2021  Vaping Use   Vaping status: Never Used  Substance and Sexual Activity   Alcohol use: Yes    Alcohol/week: 2.0 - 4.0 standard drinks of alcohol    Types: 1 - 2 Glasses of wine, 1 - 2 Cans of beer per week    Comment: 1 glass of wine or beer 3 times a week 12/26/22   Drug use: No   Sexual activity: Not Currently  Other Topics Concern   Not on file  Social History Narrative   Regular exercise: No   Social Determinants of Health   Financial Resource Strain: Not on file  Food Insecurity: No Food Insecurity (01/13/2023)   Hunger Vital Sign    Worried About Running Out of Food in the Last Year: Never true    Ran Out of Food in the Last Year: Never true  Transportation Needs: No Transportation Needs (01/13/2023)   PRAPARE - Administrator, Civil Service (Medical): No    Lack of Transportation (Non-Medical): No  Physical Activity: Not on file  Stress: Not on file  Social Connections: Not on file  Intimate Partner Violence: Not At Risk (01/13/2023)   Humiliation, Afraid, Rape, and Kick questionnaire    Fear of Current or Ex-Partner: No    Emotionally Abused: No    Physically Abused: No    Sexually Abused: No     There were no vitals taken for this visit.  Physical Exam:  Well appearing NAD HEENT: Unremarkable Neck:  No JVD, no thyromegally Lymphatics:  No adenopathy Back:  No CVA tenderness Lungs:  Clear HEART:  Regular rate rhythm, no murmurs, no rubs, no clicks Abd:  soft, positive bowel sounds, no organomegally, no rebound, no guarding Ext:  2 plus pulses, no edema, no cyanosis, no clubbing Skin:  No rashes no nodules Neuro:  CN II through XII intact, motor grossly intact  EKG  DEVICE  Normal device function.  See PaceArt for details.   Assess/Plan:  1. Persistent atrial fib  - he will continue flecainide. As he is not symptomatic, I'll hold off on another DCCV. He will  call us if he experiences more symptoms. 2. Sinus node dysfunction - he is asymptomatic, s/p PPM and is dependent down to 30/min. 3. PPM - his [employer] DDD PM is working normally. We recheck in several months. 4. 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 He will continue his current meds.    Sharlot Gowda Saifan Rayford,MD

## 2023-11-20 NOTE — Progress Notes (Signed)
Daily Session Note  Patient Details  Name: Clarence Dawson MRN: [id number] Date of Birth: 1946/11/30 Referring Provider:   Flowsheet Row PULMONARY REHAB OTHER RESP ORIENTATION from 09/03/2023 in [facility] CARDIAC REHABILITATION  Referring Provider Marca Ancona MD       Encounter Date: 11/20/2023  Check In:  Session Check In - 11/20/23 0743       Check-In   Supervising physician immediately available to respond to emergencies See telemetry face sheet for immediately available MD    Location [facility]    Staff Present Fabio Pierce, MA, RCEP, CCRP, Dow Adolph, RN, BSN    Virtual Visit No    Medication changes reported     No    Fall or balance concerns reported    No    Warm-up and Cool-down Performed on first and last piece of equipment    Resistance Training Performed Yes    VAD Patient? No    PAD/SET Patient? No      Pain Assessment   Currently in Pain? No/denies             Capillary Blood Glucose: No results found for this or any previous visit (from the past 24 hour(s)).    Social History   Tobacco Use  Smoking Status Former   Current packs/day: 0.00   Average packs/day: 3.0 packs/day for 26.0 years (77.9 ttl pk-yrs)   Types: Cigarettes   Start date: 02/20/1958   Quit date: 02/12/1984   Years since quitting: 39.7  Smokeless Tobacco Former   Types: Chew  Tobacco Comments   Former smoker 08/30/2021    Goals Met:  Proper associated with RPD/PD & O2 Sat Independence with exercise equipment Using PLB without cueing & demonstrates good technique Exercise tolerated well No report of concerns or symptoms today Strength training completed today  Goals Unmet:  Not Applicable  Comments: Pt able to follow exercise prescription today without complaint.  Will continue to monitor for progression.

## 2023-11-23 ENCOUNTER — Encounter (HOSPITAL_COMMUNITY): Payer: Medicare Other

## 2023-11-25 ENCOUNTER — Ambulatory Visit: Payer: Medicare Other | Admitting: Internal Medicine

## 2023-11-25 ENCOUNTER — Encounter (HOSPITAL_COMMUNITY)
Admission: RE | Admit: 2023-11-25 | Discharge: 2023-11-25 | Disposition: A | Discharge status: Home / Self Care | Payer: Medicare Other | Source: Ambulatory Visit | Attending: Cardiology | Admitting: Cardiology

## 2023-11-25 ENCOUNTER — Other Ambulatory Visit: Payer: Self-pay

## 2023-11-25 DIAGNOSIS — I272 Pulmonary hypertension, unspecified: Secondary | ICD-10-CM | POA: Diagnosis not present

## 2023-11-25 NOTE — Progress Notes (Signed)
Remote pacemaker transmission.   

## 2023-11-25 NOTE — Progress Notes (Signed)
Daily Session Note  Patient Details  Name: Clarence Dawson MRN: [id number] Date of Birth: 1946/02/01 Referring Provider:   Flowsheet Row PULMONARY REHAB OTHER RESP ORIENTATION from 09/03/2023 in [facility] CARDIAC REHABILITATION  Referring Provider Marca Ancona MD       Encounter Date: 11/25/2023  Check In:  Session Check In - 11/25/23 0745       Check-In   Location [facility]    Staff Present Ross Ludwig, BS, Exercise Physiologist;Other   Brook, RN   Medication changes reported     No    Fall or balance concerns reported    No    Tobacco Cessation No Change    Warm-up and Cool-down Performed on first and last piece of equipment    Resistance Training Performed Yes    VAD Patient? No    PAD/SET Patient? No      Pain Assessment   Currently in Pain? No/denies    Pain Score 0-No pain    Multiple Pain Sites No             Capillary Blood Glucose: No results found for this or any previous visit (from the past 24 hour(s)).    Social History   Tobacco Use  Smoking Status Former   Current packs/day: 0.00   Average packs/day: 3.0 packs/day for 26.0 years (77.9 ttl pk-yrs)   Types: Cigarettes   Start date: 02/20/1958   Quit date: 02/12/1984   Years since quitting: 39.8  Smokeless Tobacco Former   Types: Chew  Tobacco Comments   Former smoker 08/30/2021    Goals Met:  Independence with exercise equipment Using PLB without cueing & demonstrates good technique Exercise tolerated well No report of concerns or symptoms today Strength training completed today  Goals Unmet:  Not Applicable  Comments: Pt able to follow exercise prescription today without complaint.  Will continue to monitor for progression.

## 2023-11-27 ENCOUNTER — Ambulatory Visit (INDEPENDENT_AMBULATORY_CARE_PROVIDER_SITE_OTHER): Payer: Medicare Other

## 2023-11-30 ENCOUNTER — Encounter (HOSPITAL_COMMUNITY)
Admission: RE | Admit: 2023-11-30 | Discharge: 2023-11-30 | Disposition: A | Discharge status: Home / Self Care | Payer: Medicare Other | Source: Ambulatory Visit | Attending: Cardiology | Admitting: Cardiology

## 2023-11-30 DIAGNOSIS — I272 Pulmonary hypertension, unspecified: Secondary | ICD-10-CM | POA: Insufficient documentation

## 2023-11-30 NOTE — Progress Notes (Signed)
Daily Session Note  Patient Details  Name: Clarence Dawson MRN: [id number] Date of Birth: May 14, 1946 Referring Provider:   Flowsheet Row PULMONARY REHAB OTHER RESP ORIENTATION from 09/03/2023 in [facility] CARDIAC REHABILITATION  Referring Provider Marca Ancona MD       Encounter Date: 11/30/2023  Check In:  Session Check In - 11/30/23 0745       Check-In   Supervising physician immediately available to respond to emergencies See telemetry face sheet for immediately available ER MD    Location [facility]    Staff Present Rodena Medin, RN, BSN;Heather Fredric Mare, BS, Exercise Physiologist;Jessica Juanetta Gosling, MA, RCEP, CCRP, CCET    Virtual Visit No    Medication changes reported     No    Fall or balance concerns reported    No    Warm-up and Cool-down Performed on first and last piece of equipment    Resistance Training Performed Yes    VAD Patient? No    PAD/SET Patient? No      Pain Assessment   Currently in Pain? No/denies    Pain Score 0-No pain    Multiple Pain Sites No             Capillary Blood Glucose: No results found for this or any previous visit (from the past 24 hour(s)).    Social History   Tobacco Use  Smoking Status Former   Current packs/day: 0.00   Average packs/day: 3.0 packs/day for 26.0 years (77.9 ttl pk-yrs)   Types: Cigarettes   Start date: 02/20/1958   Quit date: 02/12/1984   Years since quitting: 39.8  Smokeless Tobacco Former   Types: Chew  Tobacco Comments   Former smoker 08/30/2021    Goals Met:  Proper associated with RPD/PD & O2 Sat Independence with exercise equipment Using PLB without cueing & demonstrates good technique Exercise tolerated well No report of concerns or symptoms today Strength training completed today  Goals Unmet:  Not Applicable  Comments: Pt able to follow exercise prescription today without complaint.  Will continue to monitor for progression.

## 2023-12-01 ENCOUNTER — Encounter (INDEPENDENT_AMBULATORY_CARE_PROVIDER_SITE_OTHER): Payer: Self-pay

## 2023-12-01 ENCOUNTER — Ambulatory Visit (INDEPENDENT_AMBULATORY_CARE_PROVIDER_SITE_OTHER): Payer: Medicare Other | Admitting: Otolaryngology

## 2023-12-01 VITALS — Ht 68.0 in | Wt 229.0 lb

## 2023-12-01 DIAGNOSIS — J343 Hypertrophy of nasal turbinates: Secondary | ICD-10-CM | POA: Diagnosis not present

## 2023-12-01 DIAGNOSIS — J324 Chronic pansinusitis: Secondary | ICD-10-CM

## 2023-12-01 DIAGNOSIS — J329 Chronic sinusitis, unspecified: Secondary | ICD-10-CM

## 2023-12-01 DIAGNOSIS — J342 Deviated nasal septum: Secondary | ICD-10-CM

## 2023-12-02 ENCOUNTER — Encounter (HOSPITAL_COMMUNITY): Payer: Self-pay | Admitting: *Deleted

## 2023-12-02 ENCOUNTER — Encounter (HOSPITAL_COMMUNITY)
Admission: RE | Admit: 2023-12-02 | Discharge: 2023-12-02 | Disposition: A | Discharge status: Home / Self Care | Payer: Medicare Other | Source: Ambulatory Visit | Attending: Cardiology

## 2023-12-02 DIAGNOSIS — I272 Pulmonary hypertension, unspecified: Secondary | ICD-10-CM | POA: Diagnosis not present

## 2023-12-02 NOTE — Progress Notes (Signed)
Daily Session Note  Patient Details  Name: Clarence Dawson MRN: [id number] Date of Birth: [birth year] Referring Provider:   Flowsheet Row PULMONARY REHAB OTHER RESP ORIENTATION from 09/03/2023 in [facility] CARDIAC REHABILITATION  Referring Provider Marca Ancona MD       Encounter Date: 12/02/2023  Check In:  Session Check In - 12/02/23 0745       Check-In   Supervising physician immediately available to respond to emergencies See telemetry face sheet for immediately available MD    Location [facility]    Staff Present Ross Ludwig, BS, Exercise Physiologist;Other    Virtual Visit No    Medication changes reported     No    Fall or balance concerns reported    No    Tobacco Cessation No Change    Warm-up and Cool-down Performed on first and last piece of equipment    Resistance Training Performed Yes    VAD Patient? No    PAD/SET Patient? No      Pain Assessment   Currently in Pain? No/denies    Pain Score 0-No pain    Multiple Pain Sites No             Capillary Blood Glucose: No results found for this or any previous visit (from the past 24 hour(s)).    Social History   Tobacco Use  Smoking Status Former   Current packs/day: 0.00   Average packs/day: 3.0 packs/day for 26.0 years (77.9 ttl pk-yrs)   Types: Cigarettes   Start date: 02/20/1958   Quit date: 02/12/1984   Years since quitting: 39.8  Smokeless Tobacco Former   Types: Chew  Tobacco Comments   Former smoker 08/30/2021    Goals Met:  Independence with exercise equipment Using PLB without cueing & demonstrates good technique Strength training completed today  Goals Unmet:  Not Applicable  Comments: Pt able to follow exercise prescription today without complaint.  Will continue to monitor for progression.

## 2023-12-02 NOTE — Progress Notes (Signed)
Pulmonary Individual Treatment Plan  Patient Details  Name: Clarence Dawson MRN: [id number] Date of Birth: 27-May-1946 Referring Provider:   Flowsheet Row PULMONARY REHAB OTHER RESP ORIENTATION from 09/03/2023 in [facility] CARDIAC REHABILITATION  Referring Provider Marca Ancona MD       Initial Encounter Date:  Flowsheet Row PULMONARY REHAB OTHER RESP ORIENTATION from 09/03/2023 in [place] CARDIAC REHABILITATION  Date 09/03/23       Visit Diagnosis: Pulmonary hypertension (HCC)  Patient's Home Medications on Admission:   Current Outpatient Medications:    acetaminophen (TYLENOL) 500 MG tablet, Take 1,000 mg by mouth 3 (three) times daily., Disp: , Rfl:    albuterol (PROVENTIL) (2.5 MG/3ML) 0.083% nebulizer solution, Take 3 mLs (2.5 mg total) by nebulization every 6 (six) hours as needed for wheezing or shortness of breath., Disp: 90 mL, Rfl: 6   albuterol (VENTOLIN HFA) 108 (90 Base) MCG/ACT inhaler, Inhale 2 puffs into the lungs every 4 (four) hours as needed for shortness of breath (only if you can't catch your breath/ asthma)., Disp: 18 g, Rfl: 5   atorvastatin (LIPITOR) 20 MG tablet, Take 20 mg by mouth at bedtime., Disp: , Rfl:    bisoprolol (ZEBETA) 5 MG tablet, TAKE 1 AND 1/2 TABLET BY MOUTH TWICE DAILY., Disp: 90 tablet, Rfl: 3   BREZTRI AEROSPHERE 160-9-4.8 MCG/ACT AERO, INHALE 2 PUFFS INTO THE LUNGS TWICE DAILY., Disp: 10.7 g, Rfl: 5   Carboxymethylcellul-Glycerin (LUBRICATING EYE DROPS OP), Place 1 drop into the right eye 2 (two) times a week. Clear eyes, Disp: , Rfl:    diltiazem (CARTIA XT) 120 MG 24 hr capsule, TAKE (1) CAPSULE BY MOUTH DAILY, MAY TAKE A EXTRA CAPSULE DAILY FOR BREAKTHROUGH AFIB., Disp: 180 capsule, Rfl: 3   dofetilide (TIKOSYN) 500 MCG capsule, Take 1 capsule (500 mcg total) by mouth 2 (two) times daily., Disp: 180 capsule, Rfl: 3   Dupilumab (DUPIXENT) 300 MG/2ML SOAJ, Inject 300 mg into the skin every 14 (fourteen) days., Disp: 12 mL, Rfl: 1    Ensifentrine (OHTUVAYRE) 3 MG/2.5ML SUSP, Take 3 mg by nebulization 2 (two) times daily., Disp: , Rfl:    EPINEPHrine 0.3 mg/0.3 mL IJ SOAJ injection, Inject 0.3 mg into the muscle as needed for anaphylaxis., Disp: 1 each, Rfl: 5   fluticasone (FLONASE) 50 MCG/ACT nasal spray, Place 2 sprays into both nostrils daily., Disp: 16 g, Rfl: 6   furosemide (LASIX) 40 MG tablet, Take 1 tablet (40 mg total) by mouth in the morning AND 0.5 tablets (20 mg total) every evening., Disp: 90 tablet, Rfl: 3   glipiZIDE (GLUCOTROL XL) 5 MG 24 hr tablet, Take 5 mg by mouth daily before breakfast., Disp: , Rfl:    hydrocortisone cream 0.5 %, Apply 1 Application topically daily as needed for itching., Disp: , Rfl:    JARDIANCE 25 MG TABS tablet, Take 25 mg by mouth daily before breakfast., Disp: , Rfl:    levothyroxine (SYNTHROID) 150 MCG tablet, Take 150 mcg by mouth daily before breakfast., Disp: , Rfl:    losartan (COZAAR) 50 MG tablet, Take 50 mg by mouth at bedtime., Disp: , Rfl:    metFORMIN (GLUCOPHAGE-XR) 500 MG 24 hr tablet, Take 500 mg by mouth in the morning and at bedtime., Disp: , Rfl:    Multiple Vitamin (MULTIVITAMIN) tablet, Take 1 tablet by mouth daily., Disp: , Rfl:    omeprazole (PRILOSEC OTC) 20 MG tablet, Take 20 mg by mouth every morning., Disp: , Rfl:    PRESCRIPTION  MEDICATION, Thumper vest, Disp: , Rfl:    Respiratory Therapy Supplies (FLUTTER) DEVI, Use as directed, Disp: 1 each, Rfl: 0   rivaroxaban (XARELTO) 20 MG TABS tablet, Take 20 mg by mouth at bedtime., Disp: , Rfl:    sodium chloride (OCEAN) 0.65 % SOLN nasal spray, Place 1 spray into both nostrils as needed for congestion., Disp: , Rfl:   Past Medical History: Past Medical History:  Diagnosis Date   Allergic rhinitis    Aortic valve disorder    Asthma    since childhood- seasonal allergies induced   Cancer (HCC)    Skin cancer- squamous, basal   Carotid artery stenosis    Essential hypertension    Full dentures    GERD  (gastroesophageal reflux disease)    H/O hiatal hernia    Hemorrhage of rectum    Hyperlipidemia    Hypothyroidism    Male circumcision    OSA (obstructive sleep apnea)    Osteoarthritis    Pacemaker    Oct 2005 in [place].   PAF (paroxysmal atrial fibrillation) (HCC)    Pneumonia    "several Times" 2015 last time   Primary localized osteoarthritis of right knee 08/11/2017   RBBB (right bundle branch block)    Sinoatrial node dysfunction (HCC)    Syncope    Tricuspid valve disorder    Type 2 diabetes mellitus (HCC)    Type II    Tobacco Use: Social History   Tobacco Use  Smoking Status Former   Current packs/day: 0.00   Average packs/day: 3.0 packs/day for 26.0 years (77.9 ttl pk-yrs)   Types: Cigarettes   Start date: 02/20/1958   Quit date: 02/12/1984   Years since quitting: 39.8  Smokeless Tobacco Former   Types: Chew  Tobacco Comments   Former smoker 08/30/2021    Labs: Review Flowsheet  More data exists      Latest Ref Rng & Units 10/20/2016 08/13/2017 09/18/2019 08/28/2023 09/01/2023  Labs for ITP Cardiac and Pulmonary Rehab  Cholestrol 0 - 200 mg/dL - - - - 409   LDL (calc) 0 - 99 mg/dL - - - - 73   HDL-C >81 mg/dL - - - - 34   Trlycerides <150 mg/dL - - - - 191   Hemoglobin A1c 4.8 - 5.6 % 8.1  7.2  7.2  - -  PH, Arterial 7.35 - 7.45 - - - 7.438  -  PCO2 arterial 32 - 48 mmHg - - - 32.7  -  Bicarbonate 20.0 - 28.0 mmol/L - - - 22.1  25.0  24.7  -  TCO2 22 - 32 mmol/L - - - 23  26  26   -  Acid-base deficit 0.0 - 2.0 mmol/L - - - 1.0  -  O2 Saturation % - - - 95  65  64  -    Details       Multiple values from one day are sorted in reverse-chronological order         Capillary Blood Glucose: Lab Results  Component Value Date   GLUCAP 139 (H) 09/09/2023   GLUCAP 207 (H) 09/09/2023   GLUCAP 106 (H) 09/07/2023   GLUCAP 119 (H) 09/07/2023   GLUCAP 130 (H) 09/04/2023     Pulmonary Assessment Scores:  Pulmonary Assessment Scores     Row Name  09/04/23 0821         ADL UCSD   ADL Phase Entry     SOB Score total 47  Rest 1     Walk 3     Stairs 3     Bath 1     Dress 2     Shop 2       CAT Score   CAT Score 16       mMRC Score   mMRC Score 1             UCSD: Self-administered rating of dyspnea associated with activities of daily living (ADLs) 6-point scale (0 = "not at all" to 5 = "maximal or unable to do because of breathlessness")  Scoring Scores range from 0 to 120.  Minimally important difference is 5 units  CAT: CAT can identify the health impairment of COPD patients and is better correlated with disease progression.  CAT has a scoring range of zero to 40. The CAT score is classified into four groups of low (less than 10), medium (10 - 20), high (21-30) and very high (31-40) based on the impact level of disease on health status. A CAT score over 10 suggests significant symptoms.  A worsening CAT score could be explained by an exacerbation, poor medication adherence, poor inhaler technique, or progression of COPD or comorbid conditions.  CAT MCID is 2 points  mMRC: mMRC (Modified Medical Research Council) Dyspnea Scale is used to assess the degree of baseline functional disability in patients of respiratory disease due to dyspnea. No minimal important difference is established. A decrease in score of 1 point or greater is considered a positive change.   Pulmonary Function Assessment:   Exercise Target Goals: Exercise Program Goal: Individual exercise prescription set using results from initial 6 min walk test and THRR while considering  patient's activity barriers and safety.   Exercise Prescription Goal: Initial exercise prescription builds to 30-45 minutes a day of aerobic activity, 2-3 days per week.  Home exercise guidelines will be given to patient during program as part of exercise prescription that the participant will acknowledge.  Activity Barriers & Risk Stratification:  Activity Barriers  & Cardiac Risk Stratification - 09/03/23 0844       Activity Barriers & Cardiac Risk Stratification   Activity Barriers Deconditioning;Muscular Weakness;Shortness of Breath;Other (comment);Arthritis;Left Knee Replacement;Right Knee Replacement;Joint Problems;Balance Concerns;History of Falls;Decreased Ventricular Function    Comments grip effected by arthritis, bilateral knee arth, and shoulders             6 Minute Walk:  6 Minute Walk     Row Name 09/03/23 1012         6 Minute Walk   Phase Initial     Distance 1120 feet     Walk Time 5.17 minutes     # of Rest Breaks 1  50 sec     MPH 2.46     METS 1.69     RPE 14     Perceived Dyspnea  2     VO2 Peak 5.91     Symptoms Yes (comment)     Comments SOB, calves painful 8/10     Resting HR 60 bpm     Resting BP 124/72     Resting Oxygen Saturation  92 %     Exercise Oxygen Saturation  during 6 min walk 87 %     Max Ex. HR 64 bpm     Max Ex. BP 146/64     2 Minute Post BP 132/70       Interval HR   1 Minute HR 60  2 Minute HR 61     3 Minute HR 62     4 Minute HR 64     5 Minute HR 60     6 Minute HR 61     2 Minute Post HR 60     Interval Heart Rate? Yes       Interval Oxygen   Interval Oxygen? Yes     Baseline Oxygen Saturation % 92 %     1 Minute Oxygen Saturation % 87 %     1 Minute Liters of Oxygen 4 L  pulsed     2 Minute Oxygen Saturation % 87 %     2 Minute Liters of Oxygen 4 L     3 Minute Oxygen Saturation % 88 %     3 Minute Liters of Oxygen 4 L     4 Minute Oxygen Saturation % 89 %     4 Minute Liters of Oxygen 4 L     5 Minute Oxygen Saturation % 87 %     5 Minute Liters of Oxygen 4 L     6 Minute Oxygen Saturation % 88 %     6 Minute Liters of Oxygen 4 L     2 Minute Post Oxygen Saturation % 90 %     2 Minute Post Liters of Oxygen 4 L              Oxygen Initial Assessment:  Oxygen Initial Assessment - 09/03/23 0849       Home Oxygen   Home Oxygen Device Portable  Concentrator;Home Concentrator    Sleep Oxygen Prescription BiPAP;Continuous    Liters per minute 4    Home Exercise Oxygen Prescription Continuous    Liters per minute 4    Home Resting Oxygen Prescription Continuous    Liters per minute 3    Compliance with Home Oxygen Use Yes      Initial 6 min Walk   Oxygen Used Pulsed;Portable Concentrator    Liters per minute 4      Program Oxygen Prescription   Program Oxygen Prescription Continuous;E-Tanks    Liters per minute 3      Intervention   Short Term Goals To learn and exhibit compliance with exercise, home and travel O2 prescription;To learn and understand importance of monitoring SPO2 with pulse oximeter and demonstrate accurate use of the pulse oximeter.;To learn and understand importance of maintaining oxygen saturations>88%;To learn and demonstrate proper pursed lip breathing techniques or other breathing techniques. ;To learn and demonstrate proper use of respiratory medications    Long  Term Goals Exhibits compliance with exercise, home  and travel O2 prescription;Maintenance of O2 saturations>88%;Compliance with respiratory medication;Verbalizes importance of monitoring SPO2 with pulse oximeter and return demonstration;Exhibits proper breathing techniques, such as pursed lip breathing or other method taught during program session;Demonstrates proper use of MDI's             Oxygen Re-Evaluation:  Oxygen Re-Evaluation     Row Name 09/03/23 1049 09/16/23 0838 10/21/23 0810 11/06/23 0834       Program Oxygen Prescription   Program Oxygen Prescription -- Continuous;E-Tanks Continuous;E-Tanks Continuous;E-Tanks    Liters per minute -- 6 6 6     Comments -- Clarence Dawson is on 6 liters when exercising -- --      Home Oxygen   Home Oxygen Device -- Portable Concentrator;Home Concentrator Portable Concentrator;Home Concentrator Portable Concentrator;Home Concentrator    Sleep Oxygen Prescription -- BiPAP;Continuous BiPAP;Continuous  BiPAP;Continuous  Liters per minute -- 4 4 4     Home Exercise Oxygen Prescription -- Continuous Continuous Continuous    Liters per minute -- 4 4 4     Home Resting Oxygen Prescription -- Continuous Continuous Continuous    Liters per minute -- 3 4 4     Compliance with Home Oxygen Use -- Yes Yes Yes      Goals/Expected Outcomes   Short Term Goals To learn and demonstrate proper pursed lip breathing techniques or other breathing techniques.  To learn and demonstrate proper pursed lip breathing techniques or other breathing techniques.  To learn and exhibit compliance with exercise, home and travel O2 prescription;To learn and understand importance of monitoring SPO2 with pulse oximeter and demonstrate accurate use of the pulse oximeter.;To learn and understand importance of maintaining oxygen saturations>88%;To learn and demonstrate proper pursed lip breathing techniques or other breathing techniques.  To learn and exhibit compliance with exercise, home and travel O2 prescription;To learn and understand importance of monitoring SPO2 with pulse oximeter and demonstrate accurate use of the pulse oximeter.;To learn and understand importance of maintaining oxygen saturations>88%;To learn and demonstrate proper pursed lip breathing techniques or other breathing techniques.     Long  Term Goals Exhibits proper breathing techniques, such as pursed lip breathing or other method taught during program session Exhibits proper breathing techniques, such as pursed lip breathing or other method taught during program session Exhibits compliance with exercise, home  and travel O2 prescription;Verbalizes importance of monitoring SPO2 with pulse oximeter and return demonstration;Maintenance of O2 saturations>88%;Exhibits proper breathing techniques, such as pursed lip breathing or other method taught during program session;Compliance with respiratory medication Exhibits compliance with exercise, home  and travel O2  prescription;Verbalizes importance of monitoring SPO2 with pulse oximeter and return demonstration;Maintenance of O2 saturations>88%;Exhibits proper breathing techniques, such as pursed lip breathing or other method taught during program session;Compliance with respiratory medication    Comments Reviewed PLB technique with pt.  Talked about how it works and it's importance in maintaining their exercise saturations. Clarence Dawson has noticed an slight increase in hois breathing. He is able to go around the grocery store without giving out and being SOB. He uses PLB when he feels SOB and it has helped him bring his oxygen levels up. He checks his levels during the day and stated that they stay WNL. Clarence Dawson is doing well in rehab.  He was out last week with a sinus infection and is having a hard time with his breathing from his sinuses.  They are starting to feel better but now struggling with side effects from antibiotic.  He is doing well with his PLB and pulm meds.  He is compliant with oxygen and is now on 6L since infection continuously.  He hopes to be able to drop back to 4L for rest once recovered.  He had a breathing test yesterday with various test done.  He felt like he had worked out for an hour. Clarence Dawson is doing well in rehab.  He is compliant with his oxygen and BiPap at night.  He says he doesn't sleep without it.  He is using PLB and doing well on meds.  He is supposed to start a new nebulizer over weekend and will bring [place] the name of it next week.  He is a little worried about the side effects of nebulizer.    Goals/Expected Outcomes Short: Become more profiecient at using PLB.   Long: Become independent at using PLB. Short: continue to monitor oxygen  levels   long term: continue to be compliant with oxygen Short: Continue to work on breathing and recovery Long: conitnued compliance Short; Try out new nebulizer Long: conitnue to be compliant             Oxygen Discharge (Final Oxygen Re-Evaluation):   Oxygen Re-Evaluation - 11/06/23 0834       Program Oxygen Prescription   Program Oxygen Prescription Continuous;E-Tanks    Liters per minute 6      Home Oxygen   Home Oxygen Device Portable Concentrator;Home Concentrator    Sleep Oxygen Prescription BiPAP;Continuous    Liters per minute 4    Home Exercise Oxygen Prescription Continuous    Liters per minute 4    Home Resting Oxygen Prescription Continuous    Liters per minute 4    Compliance with Home Oxygen Use Yes      Goals/Expected Outcomes   Short Term Goals To learn and exhibit compliance with exercise, home and travel O2 prescription;To learn and understand importance of monitoring SPO2 with pulse oximeter and demonstrate accurate use of the pulse oximeter.;To learn and understand importance of maintaining oxygen saturations>88%;To learn and demonstrate proper pursed lip breathing techniques or other breathing techniques.     Long  Term Goals Exhibits compliance with exercise, home  and travel O2 prescription;Verbalizes importance of monitoring SPO2 with pulse oximeter and return demonstration;Maintenance of O2 saturations>88%;Exhibits proper breathing techniques, such as pursed lip breathing or other method taught during program session;Compliance with respiratory medication    Comments Clarence Dawson is doing well in rehab.  He is compliant with his oxygen and BiPap at night.  He says he doesn't sleep without it.  He is using PLB and doing well on meds.  He is supposed to start a new nebulizer over weekend and will bring [place] the name of it next week.  He is a little worried about the side effects of nebulizer.    Goals/Expected Outcomes Short; Try out new nebulizer Long: conitnue to be compliant             Initial Exercise Prescription:  Initial Exercise Prescription - 09/03/23 1000       Date of Initial Exercise RX and Referring Provider   Date 09/03/23    Referring Provider Marca Ancona MD      Oxygen   Oxygen Continuous     Liters 4    Maintain Oxygen Saturation 88% or higher      Treadmill   MPH 2.2    Grade 0.5    Minutes 15    METs 2.84      REL-XR   Level 3    Speed 50    Minutes 15    METs 2.3      Prescription Details   Frequency (times per week) 3    Duration Progress to 30 minutes of continuous aerobic without signs/symptoms of physical distress      Intensity   THRR 40-80% of Max Heartrate 93-126    Ratings of Perceived Exertion 11-13    Perceived Dyspnea 0-4      Progression   Progression Continue to progress workloads to maintain intensity without signs/symptoms of physical distress.      Resistance Training   Training Prescription Yes    Weight 5 lb    Reps 10-15             Perform Capillary Blood Glucose checks as needed.  Exercise Prescription Changes:   Exercise Prescription Changes  Row Name 09/03/23 1000 09/21/23 1300 10/07/23 1300 10/21/23 1200 11/02/23 1200     Response to Exercise   Blood Pressure (Admit) 124/72 132/74 136/60 112/60 134/60   Blood Pressure (Exercise) 146/64 108/58 -- -- --   Blood Pressure (Exit) 120/62 128/58 126/74 114/62 120/60   Heart Rate (Admit) 60 bpm 60 bpm 1 bpm 60 bpm 60 bpm   Heart Rate (Exercise) 64 bpm 60 bpm 61 bpm 60 bpm 60 bpm   Heart Rate (Exit) 60 bpm 60 bpm 61 bpm 60 bpm 60 bpm   Oxygen Saturation (Admit) 92 % 90 % 95 % 94 % 94 %   Oxygen Saturation (Exercise) 87 % 88 % 89 % 91 % 90 %   Oxygen Saturation (Exit) 90 % 97 % 90 % 94 % 93 %   Rating of Perceived Exertion (Exercise) 14 13 12 13 12    Perceived Dyspnea (Exercise) 2 2 1 2 1    Symptoms SOB, calves pain 8/10 -- -- -- --   Comments walk test results -- -- -- --   Duration -- Continue with 30 min of aerobic exercise without signs/symptoms of physical distress. Continue with 30 min of aerobic exercise without signs/symptoms of physical distress. Continue with 30 min of aerobic exercise without signs/symptoms of physical distress. Continue with 30 min of aerobic  exercise without signs/symptoms of physical distress.   Intensity -- THRR unchanged THRR unchanged THRR unchanged THRR unchanged     Progression   Progression -- Continue to progress workloads to maintain intensity without signs/symptoms of physical distress. Continue to progress workloads to maintain intensity without signs/symptoms of physical distress. Continue to progress workloads to maintain intensity without signs/symptoms of physical distress. Continue to progress workloads to maintain intensity without signs/symptoms of physical distress.     Resistance Training   Training Prescription -- Yes Yes Yes Yes   Weight -- 5 lbs 5 lbs 5 lbs 5 lbs   Reps -- 10-15 10-15 10-15 10-15   Time -- 1 Minutes -- -- --     Oxygen   Oxygen -- Continuous Continuous Continuous Continuous   Liters -- 4 6 6 6      Treadmill   MPH -- 2 2 2 2    Grade -- 0.5 0 0 0   Minutes -- 15 15 15 15    METs -- 2.67 2.53 2.53 2.53     REL-XR   Level -- 3 2 2 2    Speed -- 51 53 54 55   Minutes -- 15 15 15 15    METs -- 2.5 2.4 2.6 2.4     Oxygen   Maintain Oxygen Saturation -- 88% or higher 88% or higher 88% or higher 88% or higher    Row Name 11/16/23 1500 12/01/23 0700           Response to Exercise   Blood Pressure (Admit) 132/64 140/60      Blood Pressure (Exit) 98/64 136/64      Heart Rate (Admit) 60 bpm 60 bpm      Heart Rate (Exercise) 65 bpm 63 bpm      Heart Rate (Exit) 61 bpm 60 bpm      Oxygen Saturation (Admit) 95 % 93 %      Oxygen Saturation (Exercise) 90 % 91 %      Oxygen Saturation (Exit) 96 % 92 %      Rating of Perceived Exertion (Exercise) 12 13      Perceived Dyspnea (Exercise) 1 2  Duration Continue with 30 min of aerobic exercise without signs/symptoms of physical distress. Continue with 30 min of aerobic exercise without signs/symptoms of physical distress.      Intensity THRR unchanged THRR unchanged        Progression   Progression Continue to progress workloads to  maintain intensity without signs/symptoms of physical distress. Continue to progress workloads to maintain intensity without signs/symptoms of physical distress.        Resistance Training   Training Prescription Yes Yes      Weight 5 lbs 5lbs      Reps 10-15 10-15        Oxygen   Oxygen Continuous Continuous      Liters 6 6        Treadmill   MPH 2 2      Grade 0 0      Minutes 15 15      METs 2.53 2.53        REL-XR   Level 2 2      Speed 56 54      Minutes 15 15      METs 2.7 2.5        Oxygen   Maintain Oxygen Saturation 88% or higher 88% or higher               Exercise Comments:   Exercise Goals and Review:   Exercise Goals     Row Name 09/03/23 1031             Exercise Goals   Increase Physical Activity Yes       Intervention Provide advice, education, support and counseling about physical activity/exercise needs.;Develop an individualized exercise prescription for aerobic and resistive training based on initial evaluation findings, risk stratification, comorbidities and participant's personal goals.       Expected Outcomes Short Term: Attend rehab on a regular basis to increase amount of physical activity.;Long Term: Add in home exercise to make exercise part of routine and to increase amount of physical activity.;Long Term: Exercising regularly at least 3-5 days a week.       Increase Strength and Stamina Yes       Intervention Provide advice, education, support and counseling about physical activity/exercise needs.;Develop an individualized exercise prescription for aerobic and resistive training based on initial evaluation findings, risk stratification, comorbidities and participant's personal goals.       Expected Outcomes Short Term: Increase workloads from initial exercise prescription for resistance, speed, and METs.;Short Term: Perform resistance training exercises routinely during rehab and add in resistance training at home;Long Term: Improve  cardiorespiratory fitness, muscular endurance and strength as measured by increased METs and functional capacity ( )       Able to understand and use rate of perceived exertion (RPE) scale Yes       Intervention Provide education and explanation on how to use RPE scale       Expected Outcomes Short Term: Able to use RPE daily in rehab to express subjective intensity level;Long Term:  Able to use RPE to guide intensity level when exercising independently       Able to understand and use Dyspnea scale Yes       Intervention Provide education and explanation on how to use Dyspnea scale       Expected Outcomes Short Term: Able to use Dyspnea scale daily in rehab to express subjective sense of shortness of breath during exertion;Long Term: Able to use Dyspnea scale to guide intensity level  when exercising independently       Knowledge and understanding of Target Heart Rate Range (THRR) Yes       Intervention Provide education and explanation of THRR including how the numbers were predicted and where they are located for reference       Expected Outcomes Short Term: Able to state/look up THRR;Long Term: Able to use THRR to govern intensity when exercising independently;Short Term: Able to use daily as guideline for intensity in rehab       Able to check pulse independently Yes       Intervention Provide education and demonstration on how to check pulse in carotid and radial arteries.;Review the importance of being able to check your own pulse for safety during independent exercise       Expected Outcomes Short Term: Able to explain why pulse checking is important during independent exercise;Long Term: Able to check pulse independently and accurately       Understanding of Exercise Prescription Yes       Intervention Provide education, explanation, and written materials on patient's individual exercise prescription       Expected Outcomes Long Term: Able to explain home exercise prescription to exercise  independently;Short Term: Able to explain program exercise prescription                Exercise Goals Re-Evaluation :  Exercise Goals Re-Evaluation     Row Name 09/03/23 1032 09/16/23 0805 09/18/23 0759 09/21/23 1336 10/07/23 1316     Exercise Goal Re-Evaluation   Exercise Goals Review Able to understand and use rate of perceived exertion (RPE) scale;Able to understand and use Dyspnea scale;Understanding of Exercise Prescription Increase Physical Activity;Increase Strength and Stamina;Understanding of Exercise Prescription Increase Physical Activity;Increase Strength and Stamina;Able to understand and use rate of perceived exertion (RPE) scale;Able to understand and use Dyspnea scale;Knowledge and understanding of Target Heart Rate Range (THRR);Able to check pulse independently;Understanding of Exercise Prescription Increase Physical Activity;Increase Strength and Stamina;Understanding of Exercise Prescription Increase Physical Activity;Increase Strength and Stamina;Understanding of Exercise Prescription   Comments Reviewed RPE  and dyspnea scale and program prescription with pt today.  Pt voiced understanding and was given a copy of goals to take home. Clarence Dawson has been doing good with rehab. Clarence Dawson has noticed as slight improvment since starting the program. He was able to go around the grocery store and back to his car without giving out. He does have to stop for short breaks when on the treadmill due to RLE pain. HE also likes coming to class for social. Clarence Dawson is off to a good start in rehab.  He has already started to notice an improvement in his stamina and feeling more clear headed.  Reviewed home exercise with pt today.  Pt plans to walk and use weights at home for exercise.  Reviewed THR, pulse, RPE, sign and symptoms, pulse oximetery and when to call 911 or MD.  Also discussed weather considerations and indoor options.  Pt voiced understanding. Clarence Dawson has been tolerating exercise well. He has to take  breaks on the treadmill due to calf pain. He is walking at a speed/grade of 2.0/0.5. He has increased his level on the XR to level 3. Will continue to monitor and progress as able. Clarence Dawson has been tolerating exercise well. He continues to take breaks on the treadmill due to calf pain. He increased his oxygen level to 6 liters today to improve breathing sue to having a bad day. Will continue to monitor and progress asa  able.   Expected Outcomes Short: Use RPE daily to regulate intensity.  Long: Follow program prescription Short: go over home exericse   long: continue to attend pulmponary rehab. Short: Start to add in more walking at home Long: Conitnue to improve stamina Short term: continue to exercise at current workloads until he builds resisitance   long term: continue to attend rehab Short term: continue to exercise at current workloads until he builds resisitance   long term: continue to attend rehab    Row Name 10/21/23 0816 11/03/23 0741 11/06/23 0833 11/16/23 1543 11/16/23 1544     Exercise Goal Re-Evaluation   Exercise Goals Review Increase Physical Activity;Increase Strength and Stamina;Understanding of Exercise Prescription Increase Physical Activity;Able to understand and use rate of perceived exertion (RPE) scale;Understanding of Exercise Prescription Increase Strength and Stamina;Increase Physical Activity;Understanding of Exercise Prescription Increase Physical Activity;Increase Strength and Stamina;Understanding of Exercise Prescription --   Comments Clarence Dawson is doing well in rehab. He was out last week from a sinus infection.  He was able to walk some at home while he was out.  Yesterday they had him in for a breathing test and he feels like he got a good workout from that.  He was feeling better until he got sick. Clarence Dawson is doing good in rehab. He has not increased his level on the XR or treadmill in the last two weeks. He is ;eve; 2 on the XR  and speed 2.0 on the treadmill with an RPE of 12. He  does have to take breaks on the treadmill due to his legs cramping and feeling tired. He is curretnly exercising at 2.53 METs on  the treadmill. Will continue to monitor and progress as able. Clarence Dawson is doing well in rehab. He is staying active at home and moving a lot.  He will also do weights at home.  He is feeling better overall. Clarence Dawson is tolerating exericse well. He has Clarence Dawson is tolerating exericse well. He has not increased on his levels in the last two weeks. He is exercising at 2.7 METs on the XR at level 2 and a RPE of 12. WIll continue to monitoe and progress as able.   Expected Outcomes Short: Get back to routine of exercise Long: continue to improve stamina Short: increase to level 3 on the XR  long term: continue to attend rehab short; continue to exercise on off days Long; continue to stay activie -- Short: increase level on XR to level 3   long term: continue to exericse at rehab and home            Discharge Exercise Prescription (Final Exercise Prescription Changes):  Exercise Prescription Changes - 12/01/23 0700       Response to Exercise   Blood Pressure (Admit) 140/60    Blood Pressure (Exit) 136/64    Heart Rate (Admit) 60 bpm    Heart Rate (Exercise) 63 bpm    Heart Rate (Exit) 60 bpm    Oxygen Saturation (Admit) 93 %    Oxygen Saturation (Exercise) 91 %    Oxygen Saturation (Exit) 92 %    Rating of Perceived Exertion (Exercise) 13    Perceived Dyspnea (Exercise) 2    Duration Continue with 30 min of aerobic exercise without signs/symptoms of physical distress.    Intensity THRR unchanged      Progression   Progression Continue to progress workloads to maintain intensity without signs/symptoms of physical distress.      Paramedic  Prescription Yes    Weight 5lbs    Reps 10-15      Oxygen   Oxygen Continuous    Liters 6      Treadmill   MPH 2    Grade 0    Minutes 15    METs 2.53      REL-XR   Level 2    Speed 54    Minutes 15     METs 2.5      Oxygen   Maintain Oxygen Saturation 88% or higher             Nutrition:  Target Goals: Understanding of nutrition guidelines, daily intake of sodium 1500mg , cholesterol 200mg , calories 30% from fat and 7% or less from saturated fats, daily to have 5 or more servings of fruits and vegetables.  Biometrics:  Pre Biometrics - 09/03/23 1033       Pre Biometrics   Height 5' 9.5" (1.765 m)    Weight 219 lb 14.4 oz (99.7 kg)    Waist Circumference 44 inches    Hip Circumference 42.5 inches    Waist to Hip Ratio 1.04 %    BMI (Calculated) 32.02    Grip Strength 21.7 kg    Single Leg Stand 2.3 seconds              Nutrition Therapy Plan and Nutrition Goals:  Nutrition Therapy & Goals - 09/03/23 0853       Intervention Plan   Intervention Prescribe, educate and counsel regarding individualized specific dietary modifications aiming towards targeted core components such as weight, hypertension, lipid management, diabetes, heart failure and other comorbidities.    Expected Outcomes Short Term Goal: Understand basic principles of dietary content, such as calories, fat, sodium, cholesterol and nutrients.;Long Term Goal: Adherence to prescribed nutrition plan.             Nutrition Assessments:  MEDIFICTS Score Key: >=70 Need to make dietary changes  40-70 Heart Healthy Diet <= 40 Therapeutic Level Cholesterol Diet  Flowsheet Row PULMONARY REHAB OTHER RESPIRATORY from 09/04/2023 in [facility]  Picture Your Plate Total Score on Admission 58      Picture Your Plate Scores: <28 Unhealthy dietary pattern with much room for improvement. 41-50 Dietary pattern unlikely to meet recommendations for good health and room for improvement. 51-60 More healthful dietary pattern, with some room for improvement.  >60 Healthy dietary pattern, although there may be some specific behaviors that could be improved.    Nutrition Goals Re-Evaluation:   Nutrition Goals Re-Evaluation     Row Name 09/16/23 [phone] 10/21/23 0819 11/06/23 0828         Goals   Nutrition Goal Healthy eating Short term: continue to eat smaller portions long term: continue to eat healthy Short: Get appetite back Long: Continue to eat healthy     Comment Clarence Dawson stated that he eats a lot of chicken throughout that week, he will have some beef and fish every once in a while. He also eats lots of veggies with his meals and also will do salads. He normally eats eggs some bacon and toast for breaksfest. He has been eating smaller portions, more veggies in the last 6 months and has lost about 20 lbs. He has cut down on sweets but does eat a hersey bar during the week but has not had cake and pie in a while. Clarence Dawson is doing well in rehab. He has not been able to eat and feels bad  on his antibiotic from his sinus infection.  He was able to eat last night for first time in a week.  He wants to get to feeling better again. His sister that lives with him helps him stay focused on eating healthy too. Clarence Dawson is doing well in rehab.  He is still eating with a group and eats more than he was than if he was still by himself.  He appetitie has gotten better.     Expected Outcome Short term: continue to eat smaller portions   long term: continue to eat healthy Short: Get appetite back Long: Continue to eat healthy Short: Continue to eat balance meals Long: Continue to be healthy              Nutrition Goals Discharge (Final Nutrition Goals Re-Evaluation):  Nutrition Goals Re-Evaluation - 11/06/23 0828       Goals   Nutrition Goal Short: Get appetite back Long: Continue to eat healthy    Comment Clarence Dawson is doing well in rehab.  He is still eating with a group and eats more than he was than if he was still by himself.  He appetitie has gotten better.    Expected Outcome Short: Continue to eat balance meals Long: Continue to be healthy             Psychosocial: Target Goals: Acknowledge  presence or absence of significant depression and/or stress, maximize coping skills, provide positive support system. Participant is able to verbalize types and ability to use techniques and skills needed for reducing stress and depression.  Initial Review & Psychosocial Screening:  Initial Psych Review & Screening - 09/03/23 0853       Initial Review   Current issues with Current Stress Concerns    Source of Stress Concerns Chronic Illness;Unable to perform yard/household activities;Unable to participate in former interests or hobbies;Family    Comments dealing with not being able to go and do like he wants to, loss two wives, last one in March, sister and husband living with them      Family Dynamics   Good Support System? Yes   daughter in law and grandkids live near by, sister and husband living with him currently     Barriers   Psychosocial barriers to participate in program The patient should benefit from training in stress management and relaxation.;Psychosocial barriers identified (see note)      Screening Interventions   Interventions Encouraged to exercise;To provide support and resources with identified psychosocial needs;Provide feedback about the scores to participant    Expected Outcomes Short Term goal: Utilizing psychosocial counselor, staff and physician to assist with identification of specific Stressors or current issues interfering with healing process. Setting desired goal for each stressor or current issue identified.;Long Term Goal: Stressors or current issues are controlled or eliminated.;Short Term goal: Identification and review with participant of any Quality of Life or Depression concerns found by scoring the questionnaire.;Long Term goal: The participant improves quality of Life and PHQ9 Scores as seen by post scores and/or verbalization of changes             Quality of Life Scores:  Scores of 19 and below usually indicate a poorer quality of life in these  areas.  A difference of  2-3 points is a clinically meaningful difference.  A difference of 2-3 points in the total score of the Quality of Life Index has been associated with significant improvement in overall quality of life, self-image, physical symptoms, and general  health in studies assessing change in quality of life.   PHQ-9: Review Flowsheet       09/03/2023  Depression screen PHQ 2/9  Decreased Interest 0  Down, Depressed, Hopeless 0  PHQ - 2 Score 0  Altered sleeping 0  Tired, decreased energy 0  Change in appetite 0  Feeling bad or failure about yourself  0  Trouble concentrating 0  Moving slowly or fidgety/restless 0  Suicidal thoughts 0  PHQ-9 Score 0  Difficult doing work/chores Not difficult at all    Details           Interpretation of Total Score  Total Score Depression Severity:  1-4 = Minimal depression, 5-9 = Mild depression, 10-14 = Moderate depression, 15-19 = Moderately severe depression, 20-27 = Severe depression   Psychosocial Evaluation and Intervention:  Psychosocial Evaluation - 09/03/23 0858       Psychosocial Evaluation & Interventions   Interventions Stress management education;Encouraged to exercise with the program and follow exercise prescription    Comments Clarence Dawson is coming into pulmonary rehab for pulmonary hypertension.  He is a happy and positive person and eager to get going in the program. He had a bad fall back in April over his oxygen tubing and broke three ribs and collapsed his lung.  Since his fall, he has had to rely on his oxygen more.  Previously, he could go a few hours around the house without it, but now he is on 3-4L constantly for any activity.  He has no history of anixety or depression but lost his 2nd wife to cancer in March of 2023 (1st wife to cancer in 2016).  He enjoys haning out with friends to play card and going to [employer] to read books.   He used to go to [facility] for exercise but the oxygen feels cumberson to him  currently but he would like to get back there.  His sister and her husband are currently living with him after they lost their condo, but he enjoys their company and meals together.  He has been working on weight loss and has lost 20 lb since June.  He would like to get down under 200 lb.  He has a new concentrator for oxygen that seems to be working better for him and his breathing.  He wants to get back to whre he was before his fall in April.  He would also like to try to tritrate down on his oxygen needs for activity.  He also has a daughter in law that lives locally with two grandsons that help with yard work.  They also go out to eat at least once a week together.  He has only been doing some weights and walking in the house for acitivity and would like to get back to more regular exercise again.    Expected Outcomes Short: Attend rehab to build stamina and improve breathing Long: Continue to focus on positives and wean oxygen    Continue Psychosocial Services  Follow up required by staff             Psychosocial Re-Evaluation:  Psychosocial Re-Evaluation     Row Name 09/16/23 0813 09/18/23 0802 10/21/23 0808 11/06/23 0820       Psychosocial Re-Evaluation   Current issues with None Identified None Identified;Current Stress Concerns Current Stress Concerns Current Stress Concerns    Comments Clarence Dawson stated that he has not had any issue with sleep or any major stress factors. He said he has  been feeling more energized since starting the program. He talks to his kids and grandkids often and it bring him joy -- Clarence Dawson is doing well in rehab.  He was out last week with a sinus infection.  He is now on an antibiotic for 14 days which has him feeling bad.  He is here today, but not feeling the best.  He continues to look out for family and enjoy his grandkids. Clarence Dawson is doing well in rehab.  He still has his sister living with him and they are eating more.  He is enjoying his sister, but says his brother  in law is strange.  He is enjoying the company.  He is feeling better from his sinuses but still bother him some.  He is sleeping well.    Expected Outcomes Short: continue to identify stressors    long term: continue to find ways that make him happy -- Short: Conitnue to recover from infection Long: conitnue to focus on the positives. Short: Continue to enjoy sister Long: Continue to stay positive    Interventions Stress management education;Relaxation education;Encouraged to attend Pulmonary Rehabilitation for the exercise -- Stress management education;Relaxation education;Encouraged to attend Pulmonary Rehabilitation for the exercise Relaxation education;Stress management education;Encouraged to attend Pulmonary Rehabilitation for the exercise    Continue Psychosocial Services  Follow up required by staff -- Follow up required by staff --             Psychosocial Discharge (Final Psychosocial Re-Evaluation):  Psychosocial Re-Evaluation - 11/06/23 0820       Psychosocial Re-Evaluation   Current issues with Current Stress Concerns    Comments Clarence Dawson is doing well in rehab.  He still has his sister living with him and they are eating more.  He is enjoying his sister, but says his brother in law is strange.  He is enjoying the company.  He is feeling better from his sinuses but still bother him some.  He is sleeping well.    Expected Outcomes Short: Continue to enjoy sister Long: Continue to stay positive    Interventions Relaxation education;Stress management education;Encouraged to attend Pulmonary Rehabilitation for the exercise              Education: Education Goals: Education classes will be provided on a weekly basis, covering required topics. Participant will state understanding/return demonstration of topics presented.  Learning Barriers/Preferences:  Learning Barriers/Preferences - 09/03/23 1040       Learning Barriers/Preferences   Learning Barriers Sight   contacts    Learning Preferences None             Education Topics: How Lungs Work and Diseases: - Discuss the anatomy of the lungs and diseases that can affect the lungs, such as COPD. Flowsheet Row PULMONARY REHAB OTHER RESPIRATORY from 11/25/2023 in [place] [facility]  Date 11/11/23  Educator [facility]  Instruction Review Code 1- Verbalizes Understanding       Exercise: -Discuss the importance of exercise, FITT principles of exercise, normal and abnormal responses to exercise, and how to exercise safely.   Environmental Irritants: -Discuss types of environmental irritants and how to limit exposure to environmental irritants.   Meds/Inhalers and oxygen: - Discuss respiratory medications, definition of an inhaler and oxygen, and the proper way to use an inhaler and oxygen.   Energy Saving Techniques: - Discuss methods to conserve energy and decrease shortness of breath when performing activities of daily living.    Bronchial Hygiene / Breathing Techniques: - Discuss breathing  mechanics, pursed-lip breathing technique,  proper posture, effective ways to clear airways, and other functional breathing techniques   Cleaning Equipment: - Provides group verbal and written instruction about the health risks of elevated stress, cause of high stress, and healthy ways to reduce stress.   Nutrition I: Fats: - Discuss the types of cholesterol, what cholesterol does to the body, and how cholesterol levels can be controlled.   Nutrition II: Labels: -Discuss the different components of food labels and how to read food labels.   Respiratory Infections: - Discuss the signs and symptoms of respiratory infections, ways to prevent respiratory infections, and the importance of seeking medical treatment when having a respiratory infection.   Stress I: Signs and Symptoms: - Discuss the causes of stress, how stress may lead to anxiety and depression, and ways to limit stress. Flowsheet  Row PULMONARY REHAB OTHER RESPIRATORY from 11/25/2023 in [place] [facility]  Date 10/21/23  Educator [facility]  Instruction Review Code 1- Verbalizes Understanding       Stress II: Relaxation: -Discuss relaxation techniques to limit stress. Flowsheet Row PULMONARY REHAB OTHER RESPIRATORY from 11/25/2023 in [place] [facility]  Date 10/21/23  Educator [facility]  Instruction Review Code 1- Verbalizes Understanding       Oxygen for Home/Travel: - Discuss how to prepare for travel when on oxygen and proper ways to transport and store oxygen to ensure safety.   Knowledge Questionnaire Score:  Knowledge Questionnaire Score - 09/04/23 7829       Knowledge Questionnaire Score   Pre Score 18/18             Core Components/Risk Factors/Patient Goals at Admission:  Personal Goals and Risk Factors at Admission - 09/03/23 0852       Core Components/Risk Factors/Patient Goals on Admission    Weight Management Yes;Obesity;Weight Loss    Intervention Weight Management: Develop a combined nutrition and exercise program designed to reach desired caloric intake, while maintaining appropriate intake of nutrient and fiber, sodium and fats, and appropriate energy expenditure required for the weight goal.;Weight Management: Provide education and appropriate resources to help participant work on and attain dietary goals.;Weight Management/Obesity: Establish reasonable short term and long term weight goals.;Obesity: Provide education and appropriate resources to help participant work on and attain dietary goals.    Admit Weight 219 lb 14.4 oz (99.7 kg)    Goal Weight: Short Term 215 lb (97.5 kg)    Goal Weight: Long Term 199 lb (90.3 kg)    Expected Outcomes Short Term: Continue to assess and modify interventions until short term weight is achieved;Long Term: Adherence to nutrition and physical activity/exercise program aimed toward attainment of established weight goal;Weight  Loss: Understanding of general recommendations for a balanced deficit meal plan, which promotes 1-2 lb weight loss per week and includes a negative energy balance of [phone] kcal/d;Understanding recommendations for meals to include 15-35% energy as protein, 25-35% energy from fat, 35-60% energy from carbohydrates, less than 200mg  of dietary cholesterol, 20-35 gm of total fiber daily;Understanding of distribution of calorie intake throughout the day with the consumption of 4-5 meals/snacks    Improve shortness of breath with ADL's Yes    Intervention Provide education, individualized exercise plan and daily activity instruction to help decrease symptoms of SOB with activities of daily living.    Expected Outcomes Short Term: Improve cardiorespiratory fitness to achieve a reduction of symptoms when performing ADLs;Long Term: Be able to perform more ADLs without symptoms or delay the onset of symptoms  Increase knowledge of respiratory medications and ability to use respiratory devices properly  Yes    Intervention Provide education and demonstration as needed of appropriate use of medications, inhalers, and oxygen therapy.    Expected Outcomes Short Term: Achieves understanding of medications use. Understands that oxygen is a medication prescribed by physician. Demonstrates appropriate use of inhaler and oxygen therapy.;Long Term: Maintain appropriate use of medications, inhalers, and oxygen therapy.    Diabetes Yes    Intervention Provide education about signs/symptoms and action to take for hypo/hyperglycemia.;Provide education about proper nutrition, including hydration, and aerobic/resistive exercise prescription along with prescribed medications to achieve blood glucose in normal ranges: Fasting glucose 65-99 mg/dL    Expected Outcomes Short Term: Participant verbalizes understanding of the signs/symptoms and immediate care of hyper/hypoglycemia, proper foot care and importance of medication,  aerobic/resistive exercise and nutrition plan for blood glucose control.;Long Term: Attainment of HbA1C < 7%.    Heart Failure Yes    Intervention Provide a combined exercise and nutrition program that is supplemented with education, support and counseling about heart failure. Directed toward relieving symptoms such as shortness of breath, decreased exercise tolerance, and extremity edema.    Expected Outcomes Improve functional capacity of life;Short term: Attendance in program 2-3 days a week with increased exercise capacity. Reported lower sodium intake. Reported increased fruit and vegetable intake. Reports medication compliance.;Short term: Daily weights obtained and reported for increase. Utilizing diuretic protocols set by physician.;Long term: Adoption of self-care skills and reduction of barriers for early signs and symptoms recognition and intervention leading to self-care maintenance.    Hypertension Yes    Intervention Provide education on lifestyle modifcations including regular physical activity/exercise, weight management, moderate sodium restriction and increased consumption of fresh fruit, vegetables, and low fat dairy, alcohol moderation, and smoking cessation.;Monitor prescription use compliance.    Expected Outcomes Short Term: Continued assessment and intervention until BP is < 140/78mm HG in hypertensive participants. < 130/66mm HG in hypertensive participants with diabetes, heart failure or chronic kidney disease.;Long Term: Maintenance of blood pressure at goal levels.    Lipids Yes    Intervention Provide education and support for participant on nutrition & aerobic/resistive exercise along with prescribed medications to achieve LDL 70mg , HDL >40mg .    Expected Outcomes Short Term: Participant states understanding of desired cholesterol values and is compliant with medications prescribed. Participant is following exercise prescription and nutrition guidelines.;Long Term: Cholesterol  controlled with medications as prescribed, with individualized exercise RX and with personalized nutrition plan. Value goals: LDL < 70mg , HDL > 40 mg.             Core Components/Risk Factors/Patient Goals Review:   Goals and Risk Factor Review     Row Name 09/16/23 0831 10/21/23 0820 11/06/23 0816         Core Components/Risk Factors/Patient Goals Review   Personal Goals Review Weight Management/Obesity;Improve shortness of breath with ADL's Weight Management/Obesity;Improve shortness of breath with ADL's;Increase knowledge of respiratory medications and ability to use respiratory devices properly.;Heart Failure;Hypertension Weight Management/Obesity;Improve shortness of breath with ADL's;Increase knowledge of respiratory medications and ability to use respiratory devices properly.;Heart Failure;Hypertension     Review Clarence Dawson has been managing his weight by watching what he is eating. He has cut down on portions of meals. He has also cut out pastas and most breads other them his morning toast. He has also cut out alcohol. He has noticed improvment in his breathing when doing activities in town. He uses PLB when he feels  SOB. Clarence Dawson is doing well in rehab.  He was out last week with a sinus infection.  Despite not eating, his weight is up.  He has not been as active this week and drinking more water so he may be retaining fluid.  He is good about using his PLB to help with his breathing.  He was doing better with his breathing before he got sick.  His pressures are doing well.  He had breathing test yesterday and is going to ENT today. Clarence Dawson is doing well in rehab.  He is supposed to start a new nebulizer soon.  It is one of the new ones out there and he doesn't know name of it.  He is supposed to get supplies on Saturday for it.  His doctor is hoping that it will help with his breathing.  Its supposed to work on two different enzymes.  His weight is trending down some.  He is still eating with the  others.  He has not had any herat failure symptoms.  He still has some swelling but wearing his compression socks and using fluid pill.  His pressure are doing well.     Expected Outcomes Short term: Continue to manage waright   long term: continue to exercise for happiness Short: Get back to routine exercise Long: conitnue to monitor risk factors Short: Bring in name of new nebulizer Long: Continue to work on improving breathing              Core Components/Risk Factors/Patient Goals at Discharge (Final Review):   Goals and Risk Factor Review - 11/06/23 0816       Core Components/Risk Factors/Patient Goals Review   Personal Goals Review Weight Management/Obesity;Improve shortness of breath with ADL's;Increase knowledge of respiratory medications and ability to use respiratory devices properly.;Heart Failure;Hypertension    Review Clarence Dawson is doing well in rehab.  He is supposed to start a new nebulizer soon.  It is one of the new ones out there and he doesn't know name of it.  He is supposed to get supplies on Saturday for it.  His doctor is hoping that it will help with his breathing.  Its supposed to work on two different enzymes.  His weight is trending down some.  He is still eating with the others.  He has not had any herat failure symptoms.  He still has some swelling but wearing his compression socks and using fluid pill.  His pressure are doing well.    Expected Outcomes Short: Bring in name of new nebulizer Long: Continue to work on improving breathing             ITP Comments:  ITP Comments     Row Name 09/03/23 1011 09/04/23 0801 09/09/23 0750 10/07/23 0639 11/04/23 0646   ITP Comments Patient attend orientation today.  Patient is attendingPulmonary Rehabilitation Program.  Documentation for diagnosis can be found in [facility] 09/01/23.  Reviewed medical chart, RPE/RPD, gym safety, and program guidelines.  Patient was fitted to equipment they will be using during rehab.  Patient is  scheduled to start exercise on 09/04/23 at 745.   Initial ITP created and sent for review and signature by Dr. Erick Blinks, Medical Director for Pulmonary Rehabilitation Program. First full day of exercise!  Patient was oriented to gym and equipment including functions, settings, policies, and procedures.  Patient's individual exercise prescription and treatment plan were reviewed.  All starting workloads were established based on the results of the 6 minute  walk test done at initial orientation visit.  The plan for exercise progression was also introduced and progression will be customized based on patient's performance and goals. 30 day review completed. ITP sent to Dr.Jehanzeb Memon, Medical Director of  Pulmonary Rehab. Continue with ITP unless changes are made by physician.  New to program. 30 day review completed. ITP sent to Dr.Jehanzeb Memon, Medical Director of  Pulmonary Rehab. Continue with ITP unless changes are made by physician. 30 day review completed. ITP sent to Dr.Jehanzeb Memon, Medical Director of  Pulmonary Rehab. Continue with ITP unless changes are made by physician.    Row Name 12/02/23 0802           ITP Comments 30 day review completed. ITP sent to Dr.Jehanzeb Memon, Medical Director of  Pulmonary Rehab. Continue with ITP unless changes are made by physician.                Comments: 30 day review

## 2023-12-04 ENCOUNTER — Other Ambulatory Visit: Payer: Self-pay

## 2023-12-04 ENCOUNTER — Encounter (HOSPITAL_COMMUNITY): Payer: Medicare Other

## 2023-12-04 NOTE — Progress Notes (Signed)
Specialty Pharmacy Refill Coordination Note  Clarence Dawson is a 77 y.o. male contacted today regarding refills of specialty medication(s) Dupilumab   Patient requested [name] at [facility] at [place] date: 12/09/23   Medication will be filled on 12/08/23.

## 2023-12-07 ENCOUNTER — Encounter (HOSPITAL_COMMUNITY)
Admission: RE | Admit: 2023-12-07 | Discharge: 2023-12-07 | Disposition: A | Discharge status: Home / Self Care | Payer: Medicare Other | Source: Ambulatory Visit | Attending: Cardiology | Admitting: Cardiology

## 2023-12-07 ENCOUNTER — Other Ambulatory Visit: Payer: Self-pay

## 2023-12-07 DIAGNOSIS — I272 Pulmonary hypertension, unspecified: Secondary | ICD-10-CM

## 2023-12-07 NOTE — Progress Notes (Signed)
Daily Session Note  Patient Details  Name: Clarence Dawson MRN: [id number] Date of Birth: 06-13-1946 Referring Provider:   Flowsheet Row PULMONARY REHAB OTHER RESP ORIENTATION from 09/03/2023 in [facility]ARD[facility] REH[facility]ITATION  Referring Provider Marca Ancona MD       Encounter Date: 12/07/2023  Check In:  Session Check In - 12/07/23 0745       Check-In   Supervising physician immediately available to respond to emergencies See telemetry face sheet for immediately available MD    Location [facility]    Staff Present Ross Ludwig, BS, Exercise Physiologist    Staff Present Hulen Luster, BS, RRT, CPFT    Virtual Visit No    Medication changes reported     No    Fall or balance concerns reported    No    Tobacco Cessation No Change    Warm-up and Cool-down Performed on first and last piece of equipment    Resistance Training Performed Yes    VAD Patient? No    PAD/SET Patient? No      Pain Assessment   Currently in Pain? No/denies    Pain Score 0-No pain    Multiple Pain Sites No             Capillary Blood Glucose: No results found for this or any previous visit (from the past 24 hour(s)).    Social History   Tobacco Use  Smoking Status Former   Current packs/day: 0.00   Average packs/day: 3.0 packs/day for 26.0 years (77.9 ttl pk-yrs)   Types: Cigarettes   Start date: 02/20/1958   Quit date: 02/12/1984   Years since quitting: 39.8  Smokeless Tobacco Former   Types: Chew  Tobacco Comments   Former smoker 08/30/2021    Goals Met:  Independence with exercise equipment Exercise tolerated well No report of concerns or symptoms today Strength training completed today  Goals Unmet:  Not Applicable  Comments: Pt able to follow exercise prescription today without complaint.  Will continue to monitor for progression.

## 2023-12-09 ENCOUNTER — Encounter (HOSPITAL_COMMUNITY)
Admission: RE | Admit: 2023-12-09 | Discharge: 2023-12-09 | Disposition: A | Discharge status: Home / Self Care | Payer: Medicare Other | Source: Ambulatory Visit | Attending: Cardiology | Admitting: Cardiology

## 2023-12-09 ENCOUNTER — Other Ambulatory Visit: Payer: Self-pay | Admitting: Internal Medicine

## 2023-12-09 DIAGNOSIS — I272 Pulmonary hypertension, unspecified: Secondary | ICD-10-CM

## 2023-12-09 NOTE — Progress Notes (Signed)
Daily Session Note  Patient Details  Name: Clarence Dawson MRN: [id number] Date of Birth: Feb 12, 1946 Referring Provider:   Flowsheet Row PULMONARY REHAB OTHER RESP ORIENTATION from 09/03/2023 in [facility] CARDIAC REHABILITATION  Referring Provider Marca Ancona MD       Encounter Date: 12/09/2023  Check In:  Session Check In - 12/09/23 0815       Check-In   Supervising physician immediately available to respond to emergencies See telemetry face sheet for immediately available MD    Location [facility]    Staff Present Ross Ludwig, BS, Exercise Physiologist;Debra Laural Benes, RN, BSN;Other   Sabino Dick NT   Virtual Visit No    Medication changes reported     No    Fall or balance concerns reported    No    Warm-up and Cool-down Performed on first and last piece of equipment    Resistance Training Performed Yes    VAD Patient? No    PAD/SET Patient? No      Pain Assessment   Currently in Pain? No/denies             Capillary Blood Glucose: No results found for this or any previous visit (from the past 24 hour(s)).    Social History   Tobacco Use  Smoking Status Former   Current packs/day: 0.00   Average packs/day: 3.0 packs/day for 26.0 years (77.9 ttl pk-yrs)   Types: Cigarettes   Start date: 02/20/1958   Quit date: 02/12/1984   Years since quitting: 39.8  Smokeless Tobacco Former   Types: Chew  Tobacco Comments   Former smoker 08/30/2021    Goals Met:  Proper associated with RPD/PD & O2 Sat Independence with exercise equipment Using PLB without cueing & demonstrates good technique Exercise tolerated well No report of concerns or symptoms today Strength training completed today  Goals Unmet:  Not Applicable  Comments: Pt able to follow exercise prescription today without complaint.  Will continue to monitor for progression.

## 2023-12-11 ENCOUNTER — Encounter (HOSPITAL_COMMUNITY)
Admission: RE | Admit: 2023-12-11 | Discharge: 2023-12-11 | Disposition: A | Discharge status: Home / Self Care | Payer: Medicare Other | Source: Ambulatory Visit | Attending: Cardiology

## 2023-12-11 DIAGNOSIS — I272 Pulmonary hypertension, unspecified: Secondary | ICD-10-CM

## 2023-12-11 NOTE — Progress Notes (Signed)
Daily Session Note  Patient Details  Name: Clarence Dawson MRN: [id number] Date of Birth: 05-11-1946 Referring Provider:   Flowsheet Row PULMONARY REHAB OTHER RESP ORIENTATION from 09/03/2023 in [facility] CARDIAC REHABILITATION  Referring Provider Marca Ancona MD       Encounter Date: 12/11/2023  Check In:  Session Check In - 12/11/23 0745       Check-In   Supervising physician immediately available to respond to emergencies See telemetry face sheet for immediately available ER MD    Location [facility]    Staff Present Rodena Medin, RN, BSN;Heather Fredric Mare, BS, Exercise Physiologist    Virtual Visit No    Medication changes reported     No    Fall or balance concerns reported    No    Warm-up and Cool-down Performed on first and last piece of equipment    Resistance Training Performed Yes    VAD Patient? No    PAD/SET Patient? No      Pain Assessment   Currently in Pain? No/denies    Pain Score 0-No pain    Multiple Pain Sites No             Capillary Blood Glucose: No results found for this or any previous visit (from the past 24 hours).    Social History   Tobacco Use  Smoking Status Former   Current packs/day: 0.00   Average packs/day: 3.0 packs/day for 26.0 years (77.9 ttl pk-yrs)   Types: Cigarettes   Start date: 02/20/1958   Quit date: 02/12/1984   Years since quitting: 39.8  Smokeless Tobacco Former   Types: Chew  Tobacco Comments   Former smoker 08/30/2021    Goals Met:  Proper associated with RPD/PD & O2 Sat Independence with exercise equipment Using PLB without cueing & demonstrates good technique Exercise tolerated well No report of concerns or symptoms today Strength training completed today  Goals Unmet:  Not Applicable  Comments: Pt able to follow exercise prescription today without complaint.  Will continue to monitor for progression.

## 2023-12-12 NOTE — Progress Notes (Signed)
Dear Dr. Ouida Sills, Here is my assessment for our mutual patient, Clarence Dawson. Thank you for allowing me the opportunity to care for your patient. Please do not hesitate to contact me should you have any other questions. Sincerely, Dr. Jovita Kussmaul  Otolaryngology Clinic Note Referring provider: Dr. Ouida Sills HPI:  Clarence Dawson is a 77 y.o. male kindly referred by Dr. Ouida Sills for evaluation of sinusitis.  Initial visit (09/2023): He reports that he started to have some acute onset RIGHT facial pressure/pain on 10/14. He contacted his PCP but he got worse, and he really had significant amount of worsening on 10/16 with facial swelling and then we went to the ED. He was having right facial pain, pressure, and pain. Some headache and left facial pain as well. No fevers. No anterior rhinorrhea, no post nasal drip. Normal sense of smell. No nasal congestion. No facial swelling or numbness. 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  No vomiting.   He was prescribed Clindamycin 300 TID x2 weeks, and CT was done. He reports today that he is doing "a lot better" - almost back to normal. He says the pain and discomfort is much improved, some crackling sensation but he does report some maxillary sinus pressure and head feels a "bit heavy". No tearing or dry eye symptoms or vision changes or facial numbness.  He reports that he gets a sinus infection about once a year with similar symptoms, and he gets antibiotics which improve his symptoms. He has never had anything as severe as he had just now.  No itchy eyes, runny nose, or typical AR symptoms but he is on dupixent. Does have asthma  He does use intermittent nasal rinses. Does not use flonase. Does not use Antihistamine - can't tolerate. He is on xarelto. ----------------------------------------------------  We did place him on maximal medical management, including antibiotics and steroids and saline rinses. 10/2023: He reports that on Saturday,  he started to have some green drainage coming out of the right side of the nose. He reports that he had mild pressure, but otherwise reports that he did not have pain, hyposmia, numbness, or any eye related symptoms or swelling. He said that he took some Augmentin he had left over, and bumped up his rinses and now doing better - less drainage, no pain. He wishes to rule out any infection today.  ------------------------------------------------------- We decided to treat him again with antibiotics and he now follows up after CT to ensure resolution 12/01/2023: He is doing much better now. Denies any pain, discomfort, hyposmia, numbness, eye related complaints or swelling. He did finish his abx and doing a sinonasal regimen  H&N Surgery: no Personal or FHx of bleeding dz or anesthesia difficulty: no  On 4L O2 at baseline He does have DM - metformin. Never had anything over 150. No AI disease, no lymphoma. Besides dupixent, no DMARDs  PMHx: extensive - including paroxysmal A-fib, COPD on 4L, Bronchiectasis, CHF, Pulmonary HTN on BIPAP at night, Sick sinus with a PPM  Independent Review of Additional Tests or Records:  CT Sinus 11/20/2023: significant improvement now and agree with read: still some fluid b/l max, right more chronic but some alveolar dehiscence so suspect perhaps some prior dental origin; sphenoids have cleared except mild dependent layering left; no obvious aggressive features noted at this point    CT Face 10/14/2023: independently reviewed, right nasal septal deviation, right sided pansinus opacification with some L ethmoid and sphenoid disease. Clearly some stranding  over anterior and lateral buttress, but retroantral fat pad appears symmetric (though no contrast). Query intersinus septum of sphenoid erosion - but not clear since prior CT Head also may have had a dehiscence. R nasolacrimal with some asymmetry, but no erosion. No dual density to suggest fungal etiology. Official  read queries fungal sinusitis but it does not appear significantly likely     Prior CT Head Nov 2020: clear sinuses, do still think intersinus sphenoid septum may have a dehiscence but cuts thick (5mm)    Labs: CBC 10/14/23: WBC 7.3 ANC 5.5K/uL BMP 10/14/2023: Glc 133  PMH/Meds/All/SocHx/FamHx/ROS:   Past Medical History:  Diagnosis Date   Allergic rhinitis    Aortic valve disorder    Asthma    since childhood- seasonal allergies induced   Cancer (HCC)    Skin cancer- squamous, basal   Carotid artery stenosis    Essential hypertension    Full dentures    GERD (gastroesophageal reflux disease)    H/O hiatal hernia    Hemorrhage of rectum    Hyperlipidemia    Hypothyroidism    Male circumcision    OSA (obstructive sleep apnea)    Osteoarthritis    Pacemaker    Oct 2005 in [place].   PAF (paroxysmal atrial fibrillation) (HCC)    Pneumonia    "several Times" 2015 last time   Primary localized osteoarthritis of right knee 08/11/2017   RBBB (right bundle branch block)    Sinoatrial node dysfunction (HCC)    Syncope    Tricuspid valve disorder    Type 2 diabetes mellitus (HCC)    Type II     Past Surgical History:  Procedure Laterality Date   A-V CARDIAC PACEMAKER INSERTION     Sick sinus syndrome DDR pacer   Arthropathy Right 2005   Rebuilding of left thumb and joint    CARDIAC CATHETERIZATION     CARDIAC ELECTROPHYSIOLOGY STUDY AND ABLATION  09/2008   for pvcs, Dr. Vesta Mixer   CARDIOVERSION N/A 12/18/2016   Procedure: CARDIOVERSION;  Surgeon: Chrystie Nose, MD;  Location: [facility] ENDOSCOPY;  Service: Cardiovascular;  Laterality: N/A;   CARDIOVERSION N/A 09/05/2021   Procedure: CARDIOVERSION;  Surgeon: Sande Rives, MD;  Location: [facility] ENDOSCOPY;  Service: Cardiovascular;  Laterality: N/A;   CARDIOVERSION N/A 08/06/2022   Procedure: CARDIOVERSION;  Surgeon: Little Ishikawa, MD;  Location: [facility] ENDOSCOPY;  Service: Cardiovascular;  Laterality: N/A;   CARPAL  TUNNEL RELEASE  1994   right wrist   CARPAL TUNNEL RELEASE  05/04/2012   Procedure: CARPAL TUNNEL RELEASE;  Surgeon: Nicki Reaper, MD;  Location: [facility];  Service: Orthopedics;  Laterality: Left;   CARPOMETACARPEL SUSPENSION PLASTY Right 11/16/2014   Procedure: SUSPENSIONPLASTY RIGHT THUMB TENDON TRANSFER ABDUCTOR POLLICUS LONGUS EXCISION TRAPEZIUM;  Surgeon: Cindee Salt, MD;  Location: [facility];  Service: Orthopedics;  Laterality: Right;   CHOLECYSTECTOMY  1994   CIRCUMCISION     COLONOSCOPY N/A 03/14/2013   Procedure: COLONOSCOPY;  Surgeon: Corbin Ade, MD;  Location: [facility];  Service: Endoscopy;  Laterality: N/A;  8:15 AM   EYE SURGERY     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-[facility]   EYE SURGERY  2012   Left eye Corneal transplant- partial- Cataract   FLEXIBLE BRONCHOSCOPY Right 06/23/2019   Procedure: FLEXIBLE BRONCHOSCOPY RIGHT;  Surgeon: Shane Crutch, MD;  Location: [facility] ORS;  Service: Pulmonary;  Laterality: Right;   GALLBLADDER SURGERY  12/01/2006   HAND TENDON SURGERY Left late 1990's  thumb   HEMORROIDECTOMY  2003   INJECTION KNEE Left 08/26/2017   Procedure: LEFT KNEE INJECTION;  Surgeon: Salvatore Marvel, MD;  Location: [facility] OR;  Service: Orthopedics;  Laterality: Left;   left Knee Arthroscopy     [facility]   PARTIAL KNEE ARTHROPLASTY  11/22/2012   Procedure: UNICOMPARTMENTAL KNEE;  Surgeon: Nilda Simmer, MD;  Location: [facility] OR;  Service: Orthopedics;  Laterality: Left;  left unicompartmental knee arthroplasty   PERMANENT PACEMAKER GENERATOR CHANGE N/A 01/12/2013   Procedure: PERMANENT PACEMAKER GENERATOR CHANGE;  Surgeon: Marinus Maw, MD;  Location: [facility] CATH LAB;  Service: Cardiovascular;  Laterality: N/A;   PPM GENERATOR CHANGEOUT N/A 07/07/2022   Procedure: PPM GENERATOR CHANGEOUT;  Surgeon: Marinus Maw, MD;  Location: [facility] INVASIVE CV LAB;  Service: Cardiovascular;  Laterality: N/A;   RIGHT/LEFT HEART  CATH AND CORONARY ANGIOGRAPHY N/A 08/28/2023   Procedure: RIGHT/LEFT HEART CATH AND CORONARY ANGIOGRAPHY;  Surgeon: Laurey Morale, MD;  Location: [facility] INVASIVE CV LAB;  Service: Cardiovascular;  Laterality: N/A;   Rotator cuff Surgery  2001   Right shoulder   TONSILLECTOMY     TOTAL KNEE ARTHROPLASTY Right 08/26/2017   Procedure: TOTAL KNEE ARTHROPLASTY;  Surgeon: Salvatore Marvel, MD;  Location: [facility] OR;  Service: Orthopedics;  Laterality: Right;   varicose vein reduction      Family History  Problem Relation Age of Onset   Liver cancer Mother    Pancreatic cancer Mother    Colon cancer Mother    Hypertension Father    Stroke Father    Other Father 53       Sudden Cardiac death   Heart attack Father    Cancer Sister        brain   Diabetes Sister    Colon cancer Maternal Aunt    Colon polyps Neg Hx      Social Connections: Not on file     Current Outpatient Medications:    acetaminophen (TYLENOL) 500 MG tablet, Take 1,000 mg by mouth 3 (three) times daily., Disp: , Rfl:    albuterol (PROVENTIL) (2.5 MG/3ML) 0.083% nebulizer solution, Take 3 mLs (2.5 mg total) by nebulization every 6 (six) hours as needed for wheezing or shortness of breath., Disp: 90 mL, Rfl: 6   albuterol (VENTOLIN HFA) 108 (90 Base) MCG/ACT inhaler, Inhale 2 puffs into the lungs every 4 (four) hours as needed for shortness of breath (only if you can't catch your breath/ asthma)., Disp: 18 g, Rfl: 5   atorvastatin (LIPITOR) 20 MG tablet, Take 20 mg by mouth at bedtime., Disp: , Rfl:    bisoprolol (ZEBETA) 5 MG tablet, TAKE 1 AND 1/2 TABLET BY MOUTH TWICE DAILY., Disp: 90 tablet, Rfl: 3   BREZTRI AEROSPHERE 160-9-4.8 MCG/ACT AERO, INHALE 2 PUFFS INTO THE LUNGS TWICE DAILY., Disp: 10.7 g, Rfl: 5   Carboxymethylcellul-Glycerin (LUBRICATING EYE DROPS OP), Place 1 drop into the right eye 2 (two) times a week. Clear eyes, Disp: , Rfl:    dofetilide (TIKOSYN) 500 MCG capsule, Take 1 capsule (500 mcg total) by mouth 2  (two) times daily., Disp: 180 capsule, Rfl: 3   Dupilumab (DUPIXENT) 300 MG/2ML SOAJ, Inject 300 mg into the skin every 14 (fourteen) days., Disp: 12 mL, Rfl: 1   Ensifentrine (OHTUVAYRE) 3 MG/2.5ML SUSP, Take 3 mg by nebulization 2 (two) times daily., Disp: , Rfl:    EPINEPHrine 0.3 mg/0.3 mL IJ SOAJ injection, Inject 0.3 mg into the muscle as  needed for anaphylaxis., Disp: 1 each, Rfl: 5   fluticasone (FLONASE) 50 MCG/ACT nasal spray, Place 2 sprays into both nostrils daily., Disp: 16 g, Rfl: 6   furosemide (LASIX) 40 MG tablet, Take 1 tablet (40 mg total) by mouth in the morning AND 0.5 tablets (20 mg total) every evening., Disp: 90 tablet, Rfl: 3   glipiZIDE (GLUCOTROL XL) 5 MG 24 hr tablet, Take 5 mg by mouth daily before breakfast., Disp: , Rfl:    hydrocortisone cream 0.5 %, Apply 1 Application topically daily as needed for itching., Disp: , Rfl:    JARDIANCE 25 MG TABS tablet, Take 25 mg by mouth daily before breakfast., Disp: , Rfl:    levothyroxine (SYNTHROID) 150 MCG tablet, Take 150 mcg by mouth daily before breakfast., Disp: , Rfl:    losartan (COZAAR) 50 MG tablet, Take 50 mg by mouth at bedtime., Disp: , Rfl:    metFORMIN (GLUCOPHAGE-XR) 500 MG 24 hr tablet, Take 500 mg by mouth in the morning and at bedtime., Disp: , Rfl:    Multiple Vitamin (MULTIVITAMIN) tablet, Take 1 tablet by mouth daily., Disp: , Rfl:    omeprazole (PRILOSEC OTC) 20 MG tablet, Take 20 mg by mouth every morning., Disp: , Rfl:    PRESCRIPTION MEDICATION, Thumper vest, Disp: , Rfl:    Respiratory Therapy Supplies (FLUTTER) DEVI, Use as directed, Disp: 1 each, Rfl: 0   rivaroxaban (XARELTO) 20 MG TABS tablet, Take 20 mg by mouth at bedtime., Disp: , Rfl:    sodium chloride (OCEAN) 0.65 % SOLN nasal spray, Place 1 spray into both nostrils as needed for congestion., Disp: , Rfl:    diltiazem (CARDIZEM CD) 120 MG 24 hr capsule, TAKE (1) CAPSULE BY MOUTH DAILY, MAY TAKE A EXTRA CAPSULE DAILY FOR BREAKTHROUGH AFIB.,  Disp: 180 capsule, Rfl: 3   Physical Exam:   Ht 5\' 8"  (1.727 m)   Wt 229 lb (103.9 kg)   BMI 34.82 kg/m    Salient findings:  CN II-XII intact; no palatal numbness or discoloration or fistula; clearly sensate over V1-3; no facial swelling or erythema, EOM intact; no epiphora  Bilateral EAC clear and TM intact with well pneumatized middle ear spaces Anterior rhinoscopy: Septum deviates right; bilateral inferior turbinates with modest hypertrophy No lesions of oral cavity/oropharynx; edentulous maxilla; no palatal discoloration or numbness No obviously palpable neck masses/lymphadenopathy/thyromegaly No respiratory distress or stridor  Procedures:  PROCEDURE: Bilateral Diagnostic Rigid Nasal Endoscopy (prior) Prior (09/2023):     11/10/2023:     Impression & Plans:  Clarence Dawson is a 77 y.o. male with significant co-morbidities and on dupixent who presents for: Chronic sinusitis (right > left) - Initial CT read in Oct reports concern for IFS but given significant improvement and no CN deficits, did not think this is IFS but rather an episode of acute on chronic sinusitis. We decided to treat him medically and follow up with two rounds of abx - He is doing much better from this standpoint and now asymptomatic with significant improvement in CT. At this point, do not think this is IFS at all, as we would have seen significant progression and endoscopy was reassuring multiple times. - Overall, He does have some intersphenoid sinus septum dehiscence but wonder if this was just due to past infections or anatomic variant. He does report having at least 1-2 infections/year and given his pulm status, would like to continue him on medical management for this. - Given cormobidities, I do think that medical  treatment is best for him and would like to avoid FESS - which is his preference as well Nasal septal deviation Bilateral inferior turbinate hypertrophy  - Will avoid Pred - continue  Flonase 50 mcg 2 puffs BID - Continue Neil Med sinus irrigations to BID - d/w pt re: follow up and decided PRN; certainly if he continues to have exacerbations  See below regarding exact medications prescribed this encounter including dosages and route: No orders of the defined types were placed in this encounter.    Thank you for allowing me the opportunity to care for your patient. Please do not hesitate to contact me should you have any other questions.  Sincerely, Jovita Kussmaul, MD Otolarynoglogist (ENT), [facility] Specialist Phone: [phone] Fax: [phone]  12/12/2023, 7:54 PM   MDM:  Level 4: 99214 - multiple chronic problems, now stable Complexity/Problems addressed: see above Data complexity: mod - independent CT interpretation  - Morbidity: low - Prescription Drug prescribed or managed: no

## 2023-12-14 ENCOUNTER — Encounter (HOSPITAL_COMMUNITY)
Admission: RE | Admit: 2023-12-14 | Discharge: 2023-12-14 | Disposition: A | Discharge status: Home / Self Care | Payer: Medicare Other | Source: Ambulatory Visit | Attending: Cardiology

## 2023-12-14 DIAGNOSIS — I272 Pulmonary hypertension, unspecified: Secondary | ICD-10-CM

## 2023-12-14 NOTE — Progress Notes (Signed)
Daily Session Note  Patient Details  Name: ELZIE YAHNKE MRN: [id number] Date of Birth: [birth year] Referring Provider:   Flowsheet Row PULMONARY REHAB OTHER RESP ORIENTATION from 09/03/2023 in [facility] CARDIAC REH[facility]ATION  Referring Provider Marca Ancona MD       Encounter Date: 12/14/2023  Check In:  Session Check In - 12/14/23 0745       Check-In   Supervising physician immediately available to respond to emergencies See telemetry face sheet for immediately available MD    Location [facility]    Staff Present Ross Ludwig, BS, Exercise Physiologist    Staff Present Hulen Luster, BS, RRT, CPFT    Virtual Visit No    Medication changes reported     No    Fall or balance concerns reported    No    Tobacco Cessation No Change    Warm-up and Cool-down Performed on first and last piece of equipment    Resistance Training Performed Yes    VAD Patient? No    PAD/SET Patient? No      Pain Assessment   Currently in Pain? No/denies    Pain Score 0-No pain             Capillary Blood Glucose: No results found for this or any previous visit (from the past 24 hours).    Social History   Tobacco Use  Smoking Status Former   Current packs/day: 0.00   Average packs/day: 3.0 packs/day for 26.0 years (77.9 ttl pk-yrs)   Types: Cigarettes   Start date: 02/20/1958   Quit date: 02/12/1984   Years since quitting: 39.8  Smokeless Tobacco Former   Types: Chew  Tobacco Comments   Former smoker 08/30/2021    Goals Met:  Independence with exercise equipment Using PLB without cueing & demonstrates good technique Exercise tolerated well No report of concerns or symptoms today Strength training completed today  Goals Unmet:  Not Applicable  Comments: Pt able to follow exercise prescription today without complaint.  Will continue to monitor for progression.

## 2023-12-16 ENCOUNTER — Encounter (HOSPITAL_COMMUNITY): Payer: Self-pay | Admitting: Cardiology

## 2023-12-16 ENCOUNTER — Ambulatory Visit (HOSPITAL_COMMUNITY)
Admission: RE | Admit: 2023-12-16 | Discharge: 2023-12-16 | Disposition: A | Discharge status: Home / Self Care | Payer: Medicare Other | Source: Ambulatory Visit | Attending: Cardiology | Admitting: Cardiology

## 2023-12-16 ENCOUNTER — Encounter (HOSPITAL_COMMUNITY): Payer: Medicare Other

## 2023-12-16 VITALS — BP 100/50 | HR 60 | Wt 227.0 lb

## 2023-12-16 DIAGNOSIS — I4510 Unspecified right bundle-branch block: Secondary | ICD-10-CM | POA: Diagnosis not present

## 2023-12-16 DIAGNOSIS — I480 Paroxysmal atrial fibrillation: Secondary | ICD-10-CM | POA: Diagnosis present

## 2023-12-16 DIAGNOSIS — Z87891 Personal history of nicotine dependence: Secondary | ICD-10-CM | POA: Insufficient documentation

## 2023-12-16 DIAGNOSIS — I2510 Atherosclerotic heart disease of native coronary artery without angina pectoris: Secondary | ICD-10-CM | POA: Insufficient documentation

## 2023-12-16 DIAGNOSIS — I495 Sick sinus syndrome: Secondary | ICD-10-CM | POA: Insufficient documentation

## 2023-12-16 DIAGNOSIS — I5032 Chronic diastolic (congestive) heart failure: Secondary | ICD-10-CM | POA: Insufficient documentation

## 2023-12-16 DIAGNOSIS — Z7901 Long term (current) use of anticoagulants: Secondary | ICD-10-CM | POA: Diagnosis not present

## 2023-12-16 DIAGNOSIS — J4489 Other specified chronic obstructive pulmonary disease: Secondary | ICD-10-CM | POA: Insufficient documentation

## 2023-12-16 DIAGNOSIS — G4733 Obstructive sleep apnea (adult) (pediatric): Secondary | ICD-10-CM | POA: Insufficient documentation

## 2023-12-16 DIAGNOSIS — I4819 Other persistent atrial fibrillation: Secondary | ICD-10-CM | POA: Diagnosis not present

## 2023-12-16 DIAGNOSIS — Z7984 Long term (current) use of oral hypoglycemic drugs: Secondary | ICD-10-CM | POA: Diagnosis not present

## 2023-12-16 DIAGNOSIS — Z9981 Dependence on supplemental oxygen: Secondary | ICD-10-CM | POA: Diagnosis not present

## 2023-12-16 DIAGNOSIS — Z79899 Other long term (current) drug therapy: Secondary | ICD-10-CM | POA: Diagnosis not present

## 2023-12-16 DIAGNOSIS — I110 Hypertensive heart disease with heart failure: Secondary | ICD-10-CM | POA: Insufficient documentation

## 2023-12-16 LAB — BASIC METABOLIC PANEL
Anion gap: 11 (ref 5–15)
BUN: 26 mg/dL — ABNORMAL HIGH (ref 8–23)
CO2: 28 mmol/L (ref 22–32)
Calcium: 9.3 mg/dL (ref 8.9–10.3)
Chloride: 101 mmol/L (ref 98–111)
Creatinine, Ser: 1.31 mg/dL — ABNORMAL HIGH (ref 0.61–1.24)
GFR, Estimated: 56 mL/min — ABNORMAL LOW (ref >60)
Glucose, Bld: 210 mg/dL — ABNORMAL HIGH (ref 70–99)
Potassium: 4.3 mmol/L (ref 3.5–5.1)
Sodium: 140 mmol/L (ref 135–145)

## 2023-12-16 LAB — BRAIN NATRIURETIC PEPTIDE: B Natriuretic Peptide: 256.5 pg/mL — ABNORMAL HIGH (ref 0.0–100.0)

## 2023-12-16 LAB — CBC
HCT: 50 % (ref 39.0–52.0)
Hemoglobin: 16.5 g/dL (ref 13.0–17.0)
MCH: 29.9 pg (ref 26.0–34.0)
MCHC: 33 g/dL (ref 30.0–36.0)
MCV: 90.7 fL (ref 80.0–100.0)
Platelets: 181 10*3/uL (ref 150–400)
RBC: 5.51 MIL/uL (ref 4.22–5.81)
RDW: 13.8 % (ref 11.5–15.5)
WBC: 8.7 10*3/uL (ref 4.0–10.5)
nRBC: 0 % (ref 0.0–0.2)

## 2023-12-16 MED ORDER — FUROSEMIDE 40 MG PO TABS: 40.0000 mg | ORAL_TABLET | Freq: Two times a day (BID) | ORAL | 3 refills | Status: DC

## 2023-12-16 MED ORDER — ATORVASTATIN CALCIUM 40 MG PO TABS: 40.0000 mg | ORAL_TABLET | Freq: Every day | ORAL | 3 refills | Status: DC

## 2023-12-16 NOTE — Patient Instructions (Signed)
Medication Changes:  INCREASE ATORVASTATIN TO 40MG  ONCE DAILY  INCREASE LASIX (FUROSEMIDE) TO 40MG  TWICE DAILY   Lab Work:  Labs done today, your results will be available in MyChart, we will contact you for abnormal readings.  THEN RETURN IN 10 DAYS AS SCHEDULED   THEN AGAIN IN 2 MONTHS AS SCHEDULED   Follow-Up in: 3 MONTHS WITH APP AS SCHEDULED   At the [facility], you and your health needs are our priority. We have a designated team specialized in the treatment of Heart Failure. This Care Team includes your primary Heart Failure Specialized Cardiologist (physician), Advanced Practice Providers (APPs- Physician Assistants and Nurse Practitioners), and Pharmacist who all work together to provide you with the care you need, when you need it.   You may see any of the following providers on your designated Care Team at your next follow up:  Dr. Arvilla Meres Dr. Marca Ancona Dr. Dorthula Nettles Dr. Theresia Bough Tonye Becket, NP Robbie Lis, [place] [facility] [place], [place] Brynda Peon, NP [place] Lee, NP Karle Plumber, PharmD   Please be sure to bring in all your medications bottles to every appointment.   Need to Contact us:  If you have any questions or concerns before your next appointment please send [place] a message through [place] or call our office at [phone].    TO LEAVE A MESSAGE FOR THE NURSE SELECT OPTION 2, PLEASE LEAVE A MESSAGE INCLUDING: YOUR NAME DATE OF BIRTH CALL BACK NUMBER REASON FOR CALL**this is important as we prioritize the call backs  YOU WILL RECEIVE A CALL BACK THE SAME DAY AS LONG AS YOU CALL BEFORE 4:00 PM

## 2023-12-17 ENCOUNTER — Encounter: Payer: Self-pay | Admitting: Internal Medicine

## 2023-12-17 ENCOUNTER — Telehealth: Payer: Self-pay

## 2023-12-17 ENCOUNTER — Ambulatory Visit: Payer: Medicare Other | Admitting: Pulmonary Disease

## 2023-12-17 ENCOUNTER — Encounter: Payer: Self-pay | Admitting: Pulmonary Disease

## 2023-12-17 VITALS — BP 120/66 | HR 54 | Temp 97.7°F | Ht 68.0 in | Wt 227.0 lb

## 2023-12-17 DIAGNOSIS — J471 Bronchiectasis with (acute) exacerbation: Secondary | ICD-10-CM | POA: Diagnosis not present

## 2023-12-17 DIAGNOSIS — I272 Pulmonary hypertension, unspecified: Secondary | ICD-10-CM

## 2023-12-17 DIAGNOSIS — J9611 Chronic respiratory failure with hypoxia: Secondary | ICD-10-CM

## 2023-12-17 DIAGNOSIS — M25552 Pain in left hip: Secondary | ICD-10-CM

## 2023-12-17 DIAGNOSIS — J4489 Other specified chronic obstructive pulmonary disease: Secondary | ICD-10-CM

## 2023-12-17 DIAGNOSIS — G4733 Obstructive sleep apnea (adult) (pediatric): Secondary | ICD-10-CM

## 2023-12-17 MED ORDER — DOFETILIDE 250 MCG PO CAPS: 250.0000 ug | ORAL_CAPSULE | Freq: Two times a day (BID) | ORAL | 3 refills | Status: DC

## 2023-12-17 NOTE — Patient Instructions (Addendum)
VISIT SUMMARY:  Clarence Dawson, you visited today due to new left hip pain that has been affecting your mobility. We also reviewed your ongoing conditions, including COPD/asthma overlap, bronchiectasis, atrial fibrillation, and a possible pulmonary infection. Your recent cardiology and dermatology consultations were discussed, and we made some adjustments to your treatment plan.  YOUR PLAN:  -HIP PAIN: Your hip pain is likely due to bursitis and hip flexor strain, which is causing difficulty in standing and mobility. We recommend starting physical therapy as advised by your orthopedic specialist. If there is no improvement, please contact your orthopedic specialist.  -CHRONIC OBSTRUCTIVE PULMONARY DISEASE (COPD) WITH ASTHMA OVERLAP, CHRONIC HYPOXIC RESPIRATORY FAILURE, OBSTRUCTIVE SLEEP APNEA, BRONCHIECTASIS, AND PULMONARY HYPERTENSION: These are chronic lung conditions that affect your breathing. Your condition is stable with no recent flare-ups. Continue using your current inhaler regimen and use your Albuterol nebulizer as needed.  We discussed also your difficulties bringing sputum up.  On times where this is the issue use your vest and extra time during the day use your nebulizer with albuterol prior to or during the therapy with the vest.  -ATRIAL FIBRILLATION: This is an irregular heart rhythm. Your condition is stable on Xarelto, so please continue taking it as prescribed.  -POSSIBLE PULMONARY INFECTION: The change in your sputum color suggests a possible lung infection. We will collect a sputum sample for testing and start you on an antibiotic once we know the culture results.  Due to your history of Stenotrophomonas infection this may be somewhat difficult to treat due to your current therapy with Tikosyn for your cardiac issues.  -HYPERLIPIDEMIA: This is a condition of having high cholesterol levels. Continue taking Atorvastatin as prescribed by your cardiologist to manage minor plaque buildup in  your arteries.  INSTRUCTIONS:  Please follow up in 4-6 weeks to assess your response to physical therapy and the antibiotic therapy for the possible pulmonary infection.

## 2023-12-17 NOTE — Progress Notes (Signed)
PCP: Carylon Perches, MD Cardiology: Dr. Ladona Ridgel HF Cardiology: Dr. Shirlee Latch  77 y.o. with history of paroxysmal atrial fibrillation, COPD and chronic bronchiectasis, and diastolic CHF with prominent RV failure was referred by Dr. Ladona Ridgel for evaluation of CHF and pulmonary hypertension. Patient is on 3-4 L home oxygen and uses Bipap at night.  He quit smoking in 1985 but has COPD/chronic bronchitis.  He also has bronchiectasis with history of mucus plugging/lobar collapse.  He had a remote atrial fibrillation ablation and has been on dofetilide.  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    In 4/24, he tripped, fell, and broke several left-sided ribs.  He ended up with left lower lobe collapse due to mucus plugging.  This resolved over time. However, he has been considerably more short of breath since then.  Echo was done in 7/24, showing EF 55-60%, grade 2 diastolic dysfunction, moderate RV enlargement with mild RV dysfunction, PASP 96 mmHg, severe biatrial enlargement, mild-moderate MR, IVC dilated.  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    Patient returns for followup of CHF.  Main complaint right now is left hip pain, this is limiting his ambulation.  Weight is up 7 lbs.  He continues to use 3-4 L home oxygen.  He has been taking extra Lasix at times due to peripheral edema.  He has been doing pulmonary rehab.  He gets short of breath walking longer distances, no orthopnea/PND.  No chest pain.    St Jude device interrogation: 92% a-paced, no atrial fibrillation  Labs (2/24): BNP 231, K 4, creatinine 1.04 Labs (9/24): LDL 73 Labs (10/24): K 4.8, creatinine 0.93  ECG (personally reviewed): a-paced, RBBB with QTc 606 msec  PMH: 1. Atrial fibrillation: Paroxysmal.  Remote AF ablation.  Now on dofetilide.  2. GERD 3. Type 2 diabetes 4. HTN 5. Hypothyroidism 6. Hyperlipidemia 7. COPD: Chronic  bronchitis.  Quit smoking 1985.  On home oxygen 3-4 L.   8. Chronic bronchiectasis 9. OSA: Uses Bipap 10. 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  - RHC (8/24): mean RA 4, PA 67/23 mean 37, mean PCWP 11, CI 2.13, PVR 5.7 WU 11. Sick sinus syndrome: St Jude PPM 12. CAD: Coronary angiography (8/24) with 50% dLCx, 40% pLAD.   Social History   Socioeconomic History   Marital status: Widowed    Spouse name: Not on file   Number of children: Not on file   Years of education: Not on file   Highest education level: Not on file  Occupational History   Not on file  Tobacco Use   Smoking status: Former    Current packs/day: 0.00    Average packs/day: 3.0 packs/day for 26.0 years (77.9 ttl pk-yrs)    Types: Cigarettes    Start date: 02/20/1958    Quit date: 02/12/1984    Years since quitting: 39.8   Smokeless tobacco: Former    Types: Chew   Tobacco comments:    Former smoker 08/30/2021  Vaping Use   Vaping status: Never Used  Substance and Sexual Activity   Alcohol use: Yes    Alcohol/week: 2.0 - 4.0 standard drinks of alcohol    Types: 1 - 2 Glasses of wine, 1 - 2 Cans of beer per week  Comment: 1 glass of wine or beer 3 times a week 12/26/22   Drug use: No   Sexual activity: Not Currently  Other Topics Concern   Not on file  Social History Narrative   Regular exercise: No   Social Drivers of Health   Financial Resource Strain: Not on file  Food Insecurity: No Food Insecurity (01/13/2023)   Hunger Vital Sign    Worried About Running Out of Food in the Last Year: Never true    Ran Out of Food in the Last Year: Never true  Transportation Needs: No Transportation Needs (01/13/2023)   PRAPARE - Administrator, Civil Service (Medical): No    Lack of Transportation (Non-Medical): No   Physical Activity: Not on file  Stress: Not on file  Social Connections: Not on file  Intimate Partner Violence: Not At Risk (01/13/2023)   Humiliation, Afraid, Rape, and Kick questionnaire    Fear of Current or Ex-Partner: No    Emotionally Abused: No    Physically Abused: No    Sexually Abused: No   Family History  Problem Relation Age of Onset   Liver cancer Mother    Pancreatic cancer Mother    Colon cancer Mother    Hypertension Father    Stroke Father    Other Father 61       Sudden Cardiac death   Heart attack Father    Cancer Sister        brain   Diabetes Sister    Colon cancer Maternal Aunt    Colon polyps Neg Hx    ROS: All systems reviewed and negative except as per HPI.   Current Outpatient Medications  Medication Sig Dispense Refill   acetaminophen (TYLENOL) 500 MG tablet Take 1,000 mg by mouth 3 (three) times daily.     albuterol (PROVENTIL) (2.5 MG/3ML) 0.083% nebulizer solution Take 3 mLs (2.5 mg total) by nebulization every 6 (six) hours as needed for wheezing or shortness of breath. 90 mL 6   albuterol (VENTOLIN HFA) 108 (90 Base) MCG/ACT inhaler Inhale 2 puffs into the lungs every 4 (four) hours as needed for shortness of breath (only if you can't catch your breath/ asthma). 18 g 5   bisoprolol (ZEBETA) 5 MG tablet TAKE 1 AND 1/2 TABLET BY MOUTH TWICE DAILY. 90 tablet 3   BREZTRI AEROSPHERE 160-9-4.8 MCG/ACT AERO INHALE 2 PUFFS INTO THE LUNGS TWICE DAILY. 10.7 g 5   Carboxymethylcellul-Glycerin (LUBRICATING EYE DROPS OP) Place 1 drop into the right eye 2 (two) times a week. Clear eyes     diltiazem (CARDIZEM CD) 120 MG 24 hr capsule TAKE (1) CAPSULE BY MOUTH DAILY, MAY TAKE A EXTRA CAPSULE DAILY FOR BREAKTHROUGH AFIB. 180 capsule 3   dofetilide (TIKOSYN) 500 MCG capsule Take 1 capsule (500 mcg total) by mouth 2 (two) times daily. 180 capsule 3   Dupilumab (DUPIXENT) 300 MG/2ML SOAJ Inject 300 mg into the skin every 14 (fourteen) days. 12 mL 1   Ensifentrine  (OHTUVAYRE) 3 MG/2.5ML SUSP Take 3 mg by nebulization 2 (two) times daily.     EPINEPHrine 0.3 mg/0.3 mL IJ SOAJ injection Inject 0.3 mg into the muscle as needed for anaphylaxis. 1 each 5   fluticasone (FLONASE) 50 MCG/ACT nasal spray Place 2 sprays into both nostrils daily. 16 g 6   glipiZIDE (GLUCOTROL XL) 5 MG 24 hr tablet Take 5 mg by mouth daily before breakfast.     hydrocortisone cream 0.5 % Apply 1 Application  topically daily as needed for itching.     JARDIANCE 25 MG TABS tablet Take 25 mg by mouth daily before breakfast.     levothyroxine (SYNTHROID) 150 MCG tablet Take 150 mcg by mouth daily before breakfast.     losartan (COZAAR) 50 MG tablet Take 50 mg by mouth at bedtime.     metFORMIN (GLUCOPHAGE-XR) 500 MG 24 hr tablet Take 500 mg by mouth in the morning and at bedtime.     Multiple Vitamin (MULTIVITAMIN) tablet Take 1 tablet by mouth daily.     omeprazole (PRILOSEC OTC) 20 MG tablet Take 20 mg by mouth every morning.     PRESCRIPTION MEDICATION Thumper vest     Respiratory Therapy Supplies (FLUTTER) DEVI Use as directed 1 each 0   rivaroxaban (XARELTO) 20 MG TABS tablet Take 20 mg by mouth at bedtime.     sodium chloride (OCEAN) 0.65 % SOLN nasal spray Place 1 spray into both nostrils as needed for congestion.     atorvastatin (LIPITOR) 40 MG tablet Take 1 tablet (40 mg total) by mouth at bedtime. 90 tablet 3   furosemide (LASIX) 40 MG tablet Take 1 tablet (40 mg total) by mouth 2 (two) times daily. 60 tablet 3   No current facility-administered medications for this encounter.   BP (!) 100/50   Pulse 60   Wt 103 kg (227 lb)   SpO2 93% Comment: 4 n/c  BMI 34.52 kg/m  General: NAD Neck: JVP 8 cm, no thyromegaly or thyroid nodule.  Lungs: Clear to auscultation bilaterally with normal respiratory effort. CV: Nondisplaced PMI.  Heart regular S1/S2, no S3/S4, no murmur.  1+ ankle edema with varicosities.  No carotid bruit.  Normal pedal pulses.  Abdomen: Soft, nontender, no  hepatosplenomegaly, no distention.  Skin: Intact without lesions or rashes.  Neurologic: Alert and oriented x 3.  Psych: Normal affect. Extremities: No clubbing or cyanosis.  HEENT: Normal.   Assessment/Plan: 1. Atrial fibrillation: Paroxysmal.  NSR today.  19% AF burden on device interrogation since 10/23 but only rare AF since 5/24. Had remote AF ablation, now on dofetilide.  QTC prolonged at 606 msec today, would like < 550 msec with RBBB.  - Decrease dofetilide to 250 mcg bid and return next week for ECG.  - Continue Xarelto - He is on bisoprolol and diltiazem CD for rate control if AF recurs. 2. Sick sinus syndrome: St Jude PPM.  3. 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  Echo (7/24) with EF 55-60%, grade 2 diastolic dysfunction, moderate RV enlargement with mild RV dysfunction, PASP 96 mmHg, severe biatrial enlargement, mild-moderate MR, IVC dilated.  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  NYHA class III symptoms, stable.  Mild volume overload on exam, takes extra Lasix at times.  - Increase Lasix to 40 mg bid.  BMET/BNP today, BMET/Mg in 10 days.  - Continue Jardiance.  4. COPD: Chronic bronchitis, quit smoking in 1985.  On 3-4 L home oxygen.  - Has completed pulmonary rehab.   5. Chronic bronchiectasis: Has history of mucus plugging with lobar collapse.  6. OSA: Continue CPAP.  7. CAD: Coronary angiography in 8/24 showed 50% dLCx, 40% pLAD.  Medical management.  - No ASA given Xarelto use.  - LDL above goal 55 in 9/24, increase atorvastatin to 40 mg daily with lipids/LFTs in  2 months.   Followup 3 months APP.   Marca Ancona 12/17/2023

## 2023-12-17 NOTE — Progress Notes (Signed)
Subjective:    Dawson ID: Clarence Dawson, male    DOB: 15-Aug-1946, 77 y.o.   MRN: [id number]  Dawson Care Team: Carylon Perches, MD as PCP - General (Internal Medicine) Marinus Maw, MD as PCP - Cardiology (Cardiology) Marinus Maw, MD as PCP - Electrophysiology (Cardiology) Salena Saner, MD as Consulting Physician (Pulmonary Disease)  Chief Complaint  Dawson presents with   Follow-up    BACKGROUND/INTERVAL: Clarence Dawson is Clarence 77 year old former smoker (29 PY) who presents for follow-up from his most recent visit of 26 October 2023.  At that time he was seen by me and therapy Sharyn Blitz was initiated.  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  Prior issues with ABPA and Aspergillus colonization.  He has also been diagnosed with moderate pulmonary arterial hypertension (group 3), moderate nonobstructive coronary artery disease.  He also has Clarence history of atrial fibrillation on Xarelto and Tikosyn.  He has very complex issues as noted.  He is participating in pulmonary rehab.  He uses chest vest physiotherapy for management of his bronchiectasis.  COPD/asthma management is maximized  HPI Discussed the use of AI scribe software for clinical note transcription with the Dawson, who gave verbal consent to proceed.  History of Present Illness   Clarence Dawson, Clarence 77 year old Dawson with Clarence complex medical history, presents with Clarence new complaint of left hip pain that has been troubling him for the past week and Clarence half.  He has been evaluated by orthopedics for this.  Physical therapy has been recommended.  The Dawson has Clarence history of COPD with asthma overlap, chronic hypoxic respiratory failure, obstructive sleep apnea, bronchiectasis, and pulmonary hypertension. He also has permanent atrial fibrillation and is on Xarelto.  The Dawson sought orthopedic consultation for the hip pain, which was attributed to  Clarence combination of hip flexor tension and bursitis. Despite ongoing rehabilitation, the pain persists and has been affecting the Dawson's mobility, particularly when getting into Clarence car and standing on the affected leg.  The Dawson also reports Clarence recent cardiology consultation, during which medication adjustments were made due to elevated QTc interval in the setting of Tikosyn therapy.  His Tikosyn dose was decreased.   The Dawson has been using Ohtuvayre, nebulization treatments for his COPD and reports not using his albuterol nebulizer for the past two weeks unless absolutely necessary. He has noticed Clarence change in the color of his sputum, which has taken on Clarence greenish hue with traces of brown/red.  Recall that previously he has had issues with infection with Stenotrophomonas maltophilia.  He was treated in February for the same with Bactrim DS.  The Dawson also reports Clarence dermatological issue, with new skin lesions appearing despite ongoing treatment. The dermatologist reassured the Dawson that the condition is not contagious or cancerous and should resolve on its own over time.  The Dawson's pacemaker data was recently downloaded and reviewed by his cardiologist, who noted that the Dawson's heart rhythm has been more regular since starting Tikosyn. However, there were concerns about the spacing of the heartbeats, leading to adjustments in the Tikosyn dosage (review of the cardiology notes shows that the Dawson's QTc interval was increased, this prompted decrease on Tikosyn dose). The Dawson also reports minor plaque buildup in his arteries, which is being monitored by his cardiologist.  The Dawson acknowledges his complex medical history and expresses surprise at his current state of health given his past lifestyle,  including Clarence history of smoking. Despite the recent onset of hip pain and other health concerns, the Dawson maintains Clarence positive outlook.     Review of Systems Clarence 10 point review of  systems was performed and it is as noted above otherwise negative.   Dawson Active Problem List   Diagnosis Date Noted   Chronic respiratory failure with hypoxia (HCC) 06/12/2023   Hypercoagulable state due to persistent atrial fibrillation (HCC) 01/02/2020   Chronic diastolic heart failure (HCC) 11/22/2019   Paroxysmal atrial fibrillation (HCC) 09/22/2019   Atrial fibrillation with RVR (HCC)    Lobar pneumonia (HCC) 09/21/2019   Acute respiratory failure with hypoxia (HCC) 09/21/2019   Acute on chronic respiratory failure (HCC) 09/18/2019   Severe sepsis (HCC)    Elevated lactic acid level    GERD (gastroesophageal reflux disease)    Chronic bronchitis (HCC) 02/09/2019   CAP (community acquired pneumonia) 03/03/2018   Primary localized osteoarthritis of right knee 08/11/2017   Asthma with acute exacerbation 12/08/2016   Chest pain 10/20/2016   HTN (hypertension) 10/20/2016   Morbid obesity due to excess calories (HCC) 06/24/2016   Severe persistent asthma 02/12/2016   Upper airway cough syndrome 01/24/2016   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   Persistent atrial fibrillation    Syncope    Hyperthyroidism    Hyperlipidemia    RBBB (right bundle branch block)    Tricuspid valve disorder    Aortic valve disorder    Carotid artery stenosis    Type 2 diabetes mellitus (HCC)    Pacemaker    OSA (obstructive sleep apnea)    Arthritis    Sinoatrial node dysfunction (HCC)    PVC's (premature ventricular contractions) 07/17/2011   Essential hypertension, benign 01/24/2011   Premature ventricular contractions 01/24/2011   PPM-St.Jude 01/24/2011    Social History   Tobacco Use   Smoking status: Former    Current packs/day: 0.00    Average packs/day: 3.0 packs/day for 26.0 years (77.9 ttl pk-yrs)    Types: Cigarettes    Start date: 02/20/1958    Quit date: 02/12/1984    Years since quitting: 39.8   Smokeless tobacco: Former    Types: Chew   Tobacco comments:    Former smoker  08/30/2021  Substance Use Topics   Alcohol use: Yes    Alcohol/week: 2.0 - 4.0 standard drinks of alcohol    Types: 1 - 2 Glasses of wine, 1 - 2 Cans of beer per week    Comment: 1 glass of wine or beer 3 times Clarence week 12/26/22    Allergies  Allergen Reactions   Food Anaphylaxis and Shortness Of Breath    TREE NUTS   Iodinated Contrast Media Hives and Shortness Of Breath    Dawson states hives to throat closing. (01/15/17: Dawson states this reaction was "about 20 years ago" with possibly an IVP.  He now says high doses of prednisone "throw me into AFib."  He has tolerated CT arthrograms with Benadrly 50mg  PO one hour before injection.  Donell Sievert, RN)   Shellfish Allergy Anaphylaxis and Shortness Of Breath    To shellfish, crabs.  Makes him feel like "things are crawling all over" me.  Denies airway issues with these foods.  Donell Sievert, RN 01/15/17)   Goat-Derived Products Hives    GOAT CHEESE    Prednisone Palpitations    PRECIPITATES Clarence-FIB   Diclofenac Sodium Other (See Comments)    Hives, "buggy feeling all over"   Metformin And  Related Diarrhea    High doses at once   Vancomycin Anxiety    Red man syndrome   Voltaren [Diclofenac Sodium] Other (See Comments)    Feels like things are crawling on him    Current Meds  Medication Sig   acetaminophen (TYLENOL) 500 MG tablet Take 1,000 mg by mouth 3 (three) times daily.   albuterol (PROVENTIL) (2.5 MG/3ML) 0.083% nebulizer solution Take 3 mLs (2.5 mg total) by nebulization every 6 (six) hours as needed for wheezing or shortness of breath.   albuterol (VENTOLIN HFA) 108 (90 Base) MCG/ACT inhaler Inhale 2 puffs into the lungs every 4 (four) hours as needed for shortness of breath (only if you can't catch your breath/ asthma).   atorvastatin (LIPITOR) 40 MG tablet Take 1 tablet (40 mg total) by mouth at bedtime.   bisoprolol (ZEBETA) 5 MG tablet TAKE 1 AND 1/2 TABLET BY MOUTH TWICE DAILY.   BREZTRI AEROSPHERE 160-9-4.8 MCG/ACT AERO  INHALE 2 PUFFS INTO THE LUNGS TWICE DAILY.   Carboxymethylcellul-Glycerin (LUBRICATING EYE DROPS OP) Place 1 drop into the right eye 2 (two) times Clarence week. Clear eyes   diltiazem (CARDIZEM CD) 120 MG 24 hr capsule TAKE (1) CAPSULE BY MOUTH DAILY, MAY TAKE Clarence EXTRA CAPSULE DAILY FOR BREAKTHROUGH AFIB.   dofetilide (TIKOSYN) 250 MCG capsule Take 1 capsule (250 mcg total) by mouth 2 (two) times daily.   Dupilumab (DUPIXENT) 300 MG/2ML SOAJ Inject 300 mg into the skin every 14 (fourteen) days.   Ensifentrine (OHTUVAYRE) 3 MG/2.5ML SUSP Take 3 mg by nebulization 2 (two) times daily.   EPINEPHrine 0.3 mg/0.3 mL IJ SOAJ injection Inject 0.3 mg into the muscle as needed for anaphylaxis.   fluticasone (FLONASE) 50 MCG/ACT nasal spray Place 2 sprays into both nostrils daily.   furosemide (LASIX) 40 MG tablet Take 1 tablet (40 mg total) by mouth 2 (two) times daily.   glipiZIDE (GLUCOTROL XL) 5 MG 24 hr tablet Take 5 mg by mouth daily before breakfast.   hydrocortisone cream 0.5 % Apply 1 Application topically daily as needed for itching.   JARDIANCE 25 MG TABS tablet Take 25 mg by mouth daily before breakfast.   levothyroxine (SYNTHROID) 150 MCG tablet Take 150 mcg by mouth daily before breakfast.   losartan (COZAAR) 50 MG tablet Take 50 mg by mouth at bedtime.   metFORMIN (GLUCOPHAGE-XR) 500 MG 24 hr tablet Take 500 mg by mouth in the morning and at bedtime.   Multiple Vitamin (MULTIVITAMIN) tablet Take 1 tablet by mouth daily.   omeprazole (PRILOSEC OTC) 20 MG tablet Take 20 mg by mouth every morning.   PRESCRIPTION MEDICATION Thumper vest   Respiratory Therapy Supplies (FLUTTER) DEVI Use as directed   rivaroxaban (XARELTO) 20 MG TABS tablet Take 20 mg by mouth at bedtime.   sodium chloride (OCEAN) 0.65 % SOLN nasal spray Place 1 spray into both nostrils as needed for congestion.    Immunization History  Administered Date(s) Administered   Fluad Quad(high Dose 65+) 09/12/2019, 10/15/2022   Influenza  Split 11/14/2015, 10/11/2018   Influenza, High Dose Seasonal PF 09/28/2017   Influenza-Unspecified 11/02/2016, 09/03/2020, 10/03/2021   Moderna SARS-COV2 Booster Vaccination 08/20/2020, 03/04/2021   Moderna Sars-Covid-2 Vaccination 02/15/2020, 03/14/2020   Pneumococcal Conjugate-13 09/28/2015   Tdap 11/28/2019        Objective:     BP 120/66 (BP Location: Left Arm, Dawson Position: Sitting, Cuff Size: Normal)   Pulse (!) 54   Temp 97.7 F (36.5 C) (Temporal)  Ht 5\' 8"  (1.727 m)   Wt 227 lb (103 kg)   SpO2 92%   BMI 34.52 kg/m   SpO2: 92 % O2 Device: Nasal cannula O2 Flow Rate (L/min): 4 L/min O2 Type: Pulse O2  GENERAL: Well-developed, obese Dawson in no acute distress.  He is comfortable on nasal cannula O2.  He is fully ambulatory. HEAD: Normocephalic, atraumatic.  EYES: Pupils equal, round, reactive to light.  No scleral icterus.  MOUTH:   Upper and lower dentures.  Oral mucosa moist.  No thrush. NECK: Supple. No thyromegaly. Trachea midline. No JVD.  No adenopathy. PULMONARY: Good air entry bilaterally air entry symmetrical throughout.  Non labored, there are rare wheezes throughout.  No rhonchi noted. CARDIOVASCULAR: S1 and S2.  Regular rate and rhythm, NSR, rate 62.  Grade 2/6 mitral regurgitation murmur, grade 3/6 aortic stenosis murmur. Pacemaker on left. GASTROINTESTINAL: Obese, otherwise benign. MUSCULOSKELETAL: No joint deformity, no clubbing, no edema.  NEUROLOGIC: No focal deficits, speech is fluent.  No gait disturbance noted.  Awake, alert and oriented. SKIN: Intact,warm,dry. PSYCH: Mood and behavior appropriate.   Assessment & Plan:     ICD-10-CM   1. Bronchiectasis with acute exacerbation (HCC)  J47.1 Expectorated Sputum Assessment w Gram Stain, Rflx to Resp Cult    2. Asthma-COPD overlap syndrome (HCC)  J44.89     3. Chronic respiratory failure with hypoxia (HCC)  J96.11     4. Pulmonary HTN (HCC)  I27.20     5. OSA (obstructive sleep apnea)   G47.33     6. Pain of left hip  M25.552       Orders Placed This Encounter  Procedures   Expectorated Sputum Assessment w Gram Stain, Rflx to Resp Cult    Standing Status:   Future    Expiration Date:   12/16/2024   Discussion:   Chronic Obstructive Pulmonary Disease (COPD) with Asthma Overlap, Chronic Hypoxic Respiratory Failure, Obstructive Sleep Apnea, Bronchiectasis, and Pulmonary Hypertension: Appears to have bronchiectasis exacerbation. -Continue current inhaler/nebulization regimen. -Continue airway clearance management with vest/Acapella -Use Albuterol nebulizer as needed. -Sputum culture, no antibiotic prescription until cultures are known -Dawson has had Stenotrophomonas infection previously, this only is responsive to Levaquin and Bactrim DS and unfortunately these occasions will be contraindicated with Tikosyn therapy due to potential to worsen QTc interval or increase level of Tikosyn.  Atrial Fibrillation: On Xarelto and Tikosyn. -Continue Xarelto. -Tikosyn adjustment by cardiology recently. -This issue adds complexity to his management.  Possible Pulmonary Infection: Recent increase in sputum production with color change suggestive of possible infection. -Collect sputum sample for culture. -Await culture results due to history of Stenotrophomonas infection. -Potential effective antibiotics interact with Tikosyn.  Hyperlipidemia: Recent increase in Atorvastatin due to finding of minor plaque buildup. -Continue Atorvastatin as prescribed by cardiologist.  Hip Pain: Likely due to bursitis and hip flexor strain. Pain severe enough to limit mobility and cause difficulty standing on affected side. -Start physical therapy as recommended by orthopedic specialist. -Contact orthopedic specialist if no improvement with physical therapy.  Follow-up in 4-6 weeks to reassess.      Advised if symptoms do not improve or worsen, to please contact office for sooner follow up or  seek emergency care.    I spent 45 minutes of dedicated to the care of this Dawson on the date of this encounter to include pre-visit review of records, face-to-face time with the Dawson discussing conditions above, post visit ordering of testing, clinical documentation with the electronic health record,  making appropriate referrals as documented, and communicating necessary findings to members of the patients care team.     C. Danice Goltz, MD Advanced Bronchoscopy PCCM  [facility]    *This note was generated using voice recognition software/Dragon and/or AI transcription program.  Despite best efforts to proofread, errors can occur which can change the meaning. Any transcriptional errors that result from this process are unintentional and may not be fully corrected at the time of dictation.

## 2023-12-17 NOTE — Telephone Encounter (Signed)
Per Dr. Shirlee Latch-  His QTc was too long on ECG. Needs to decrease Tikosyn to 250 mcg bid and get repeat ECG early next week.    Spoke to patient and made him aware- new dose of tikosyn sent to patient preferred pharmacy.   Patient scheduled to come next week to clinic on 12/24 for repeat EKG- patient aware of appointment time and date and verbalized understanding.

## 2023-12-18 ENCOUNTER — Encounter (HOSPITAL_COMMUNITY): Payer: Medicare Other

## 2023-12-21 ENCOUNTER — Encounter (HOSPITAL_COMMUNITY): Payer: Medicare Other

## 2023-12-22 ENCOUNTER — Ambulatory Visit: Payer: Medicare Other

## 2023-12-22 ENCOUNTER — Ambulatory Visit (HOSPITAL_COMMUNITY)
Admission: RE | Admit: 2023-12-22 | Discharge: 2023-12-22 | Disposition: A | Discharge status: Home / Self Care | Payer: Medicare Other | Source: Ambulatory Visit | Attending: Cardiology | Admitting: Cardiology

## 2023-12-22 VITALS — BP 120/82 | HR 60 | Wt 225.8 lb

## 2023-12-22 DIAGNOSIS — I480 Paroxysmal atrial fibrillation: Secondary | ICD-10-CM | POA: Insufficient documentation

## 2023-12-22 NOTE — Progress Notes (Addendum)
Patient here today in clinic for EKG due to decrease in Tikosyn dose last week due to QTC.   Patient reports that he has been compliant with decreased dose of Tikosyn.   Patient does state that he had an episode last week where he woke up with sharpe pain in his left leg behind his knee. 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 Patient states that he also had some pain in his toes. Patient denies any issues since this- denies any pain with walking or sitting, no redness, warmness or tenderness to the touch. Patient is compliant with Xarelto 20mg  daily. Dr. Elwyn Lade in clinic today made aware- will continue to monitor if any changes will let us know.   EKG reviewed by Dr. Elwyn Lade- QTC today 514- per Dr. Elwyn Lade this is okay patient will continue Tikosyn 250mg  BID    Reviewed EKG, continue on current dose of 250mg  BID.

## 2023-12-25 ENCOUNTER — Other Ambulatory Visit
Admission: RE | Admit: 2023-12-25 | Discharge: 2023-12-25 | Disposition: A | Discharge status: Home / Self Care | Payer: Medicare Other | Source: Ambulatory Visit | Attending: Internal Medicine | Admitting: Internal Medicine

## 2023-12-25 ENCOUNTER — Encounter (HOSPITAL_COMMUNITY): Payer: Medicare Other

## 2023-12-25 DIAGNOSIS — J471 Bronchiectasis with (acute) exacerbation: Secondary | ICD-10-CM | POA: Diagnosis present

## 2023-12-25 LAB — EXPECTORATED SPUTUM ASSESSMENT W GRAM STAIN, RFLX TO RESP C

## 2023-12-25 NOTE — Addendum Note (Signed)
Encounter addended by: Romie Minus, MD on: 12/25/2023 8:57 PM  Actions taken: Clinical Note Signed

## 2023-12-28 ENCOUNTER — Ambulatory Visit (HOSPITAL_COMMUNITY)
Admission: RE | Admit: 2023-12-28 | Discharge: 2023-12-28 | Disposition: A | Discharge status: Home / Self Care | Payer: Medicare Other | Source: Ambulatory Visit | Attending: Cardiology | Admitting: Cardiology

## 2023-12-28 ENCOUNTER — Encounter (HOSPITAL_COMMUNITY): Payer: Medicare Other

## 2023-12-28 DIAGNOSIS — I5032 Chronic diastolic (congestive) heart failure: Secondary | ICD-10-CM | POA: Diagnosis present

## 2023-12-28 LAB — BASIC METABOLIC PANEL
Anion gap: 11 (ref 5–15)
BUN: 18 mg/dL (ref 8–23)
CO2: 27 mmol/L (ref 22–32)
Calcium: 9.1 mg/dL (ref 8.9–10.3)
Chloride: 104 mmol/L (ref 98–111)
Creatinine, Ser: 1.11 mg/dL (ref 0.61–1.24)
GFR, Estimated: >60 mL/min (ref >60)
Glucose, Bld: 134 mg/dL — ABNORMAL HIGH (ref 70–99)
Potassium: 4.5 mmol/L (ref 3.5–5.1)
Sodium: 142 mmol/L (ref 135–145)

## 2023-12-28 LAB — CULTURE, RESPIRATORY W GRAM STAIN

## 2023-12-29 ENCOUNTER — Encounter: Payer: Self-pay | Admitting: Cardiology

## 2023-12-29 ENCOUNTER — Encounter (HOSPITAL_COMMUNITY): Payer: Self-pay | Admitting: *Deleted

## 2023-12-29 DIAGNOSIS — I272 Pulmonary hypertension, unspecified: Secondary | ICD-10-CM

## 2023-12-29 NOTE — Progress Notes (Signed)
 Pulmonary Individual Treatment Plan  Patient Details  Name: Clarence Dawson MRN: [id number] Date of Birth: 1946-09-07 Referring Provider:   Flowsheet Row PULMONARY REHAB OTHER RESP ORIENTATION from 09/03/2023 in [facility] CARDIAC REHABILITATION  Referring Provider Rolan Barrack MD       Initial Encounter Date:  Flowsheet Row PULMONARY REHAB OTHER RESP ORIENTATION from 09/03/2023 in [facility]  Date 09/03/23       Visit Diagnosis: Pulmonary hypertension (HCC)  Patient's Home Medications on Admission:   Current Outpatient Medications:    acetaminophen  (TYLENOL ) 500 MG tablet, Take 1,000 mg by mouth 3 (three) times daily., Disp: , Rfl:    albuterol  (PROVENTIL ) (2.5 MG/3ML) 0.083% nebulizer solution, Take 3 mLs (2.5 mg total) by nebulization every 6 (six) hours as needed for wheezing or shortness of breath., Disp: 90 mL, Rfl: 6   albuterol  (VENTOLIN  HFA) 108 (90 Base) MCG/ACT inhaler, Inhale 2 puffs into the lungs every 4 (four) hours as needed for shortness of breath (only if you can't catch your breath/ asthma)., Disp: 18 g, Rfl: 5   atorvastatin  (LIPITOR) 40 MG tablet, Take 1 tablet (40 mg total) by mouth at bedtime., Disp: 90 tablet, Rfl: 3   bisoprolol  (ZEBETA ) 5 MG tablet, TAKE 1 AND 1/2 TABLET BY MOUTH TWICE DAILY., Disp: 90 tablet, Rfl: 3   BREZTRI  AEROSPHERE 160-9-4.8 MCG/ACT AERO, INHALE 2 PUFFS INTO THE LUNGS TWICE DAILY., Disp: 10.7 g, Rfl: 5   Carboxymethylcellul-Glycerin (LUBRICATING EYE DROPS OP), Place 1 drop into the right eye 2 (two) times a week. Clear eyes, Disp: , Rfl:    diltiazem  (CARDIZEM  CD) 120 MG 24 hr capsule, TAKE (1) CAPSULE BY MOUTH DAILY, MAY TAKE A EXTRA CAPSULE DAILY FOR BREAKTHROUGH AFIB., Disp: 180 capsule, Rfl: 3   dofetilide  (TIKOSYN ) 250 MCG capsule, Take 1 capsule (250 mcg total) by mouth 2 (two) times daily., Disp: 60 capsule, Rfl: 3   Dupilumab  (DUPIXENT ) 300 MG/2ML SOAJ, Inject 300 mg into the skin every 14 (fourteen) days.,  Disp: 12 mL, Rfl: 1   Ensifentrine  (OHTUVAYRE ) 3 MG/2.5ML SUSP, Take 3 mg by nebulization 2 (two) times daily., Disp: , Rfl:    EPINEPHrine  0.3 mg/0.3 mL IJ SOAJ injection, Inject 0.3 mg into the muscle as needed for anaphylaxis., Disp: 1 each, Rfl: 5   fluticasone  (FLONASE ) 50 MCG/ACT nasal spray, Place 2 sprays into both nostrils daily., Disp: 16 g, Rfl: 6   furosemide  (LASIX ) 40 MG tablet, Take 1 tablet (40 mg total) by mouth 2 (two) times daily., Disp: 60 tablet, Rfl: 3   glipiZIDE  (GLUCOTROL  XL) 5 MG 24 hr tablet, Take 5 mg by mouth daily before breakfast., Disp: , Rfl:    hydrocortisone cream 0.5 %, Apply 1 Application topically daily as needed for itching., Disp: , Rfl:    JARDIANCE  25 MG TABS tablet, Take 25 mg by mouth daily before breakfast., Disp: , Rfl:    levothyroxine  (SYNTHROID ) 150 MCG tablet, Take 150 mcg by mouth daily before breakfast., Disp: , Rfl:    losartan  (COZAAR ) 50 MG tablet, Take 50 mg by mouth at bedtime., Disp: , Rfl:    metFORMIN  (GLUCOPHAGE -XR) 500 MG 24 hr tablet, Take 500 mg by mouth in the morning and at bedtime., Disp: , Rfl:    Multiple Vitamin (MULTIVITAMIN) tablet, Take 1 tablet by mouth daily., Disp: , Rfl:    omeprazole  (PRILOSEC  OTC) 20 MG tablet, Take 20 mg by mouth every morning., Disp: , Rfl:    PRESCRIPTION MEDICATION, Thumper vest,  Disp: , Rfl:    Respiratory Therapy Supplies (FLUTTER) DEVI, Use as directed, Disp: 1 each, Rfl: 0   rivaroxaban  (XARELTO ) 20 MG TABS tablet, Take 20 mg by mouth at bedtime., Disp: , Rfl:    sodium chloride  (OCEAN) 0.65 % SOLN nasal spray, Place 1 spray into both nostrils as needed for congestion., Disp: , Rfl:   Past Medical History: Past Medical History:  Diagnosis Date   Allergic rhinitis    Aortic valve disorder    Asthma    since childhood- seasonal allergies induced   Cancer (HCC)    Skin cancer- squamous, basal   Carotid artery stenosis    Essential hypertension    Full dentures    GERD (gastroesophageal  reflux disease)    H/O hiatal hernia    Hemorrhage of rectum    Hyperlipidemia    Hypothyroidism    Male circumcision    OSA (obstructive sleep apnea)    Osteoarthritis    Pacemaker    Oct 2005 in [place].   PAF (paroxysmal atrial fibrillation) (HCC)    Pneumonia    several Times 2015 last time   Primary localized osteoarthritis of right knee 08/11/2017   RBBB (right bundle branch block)    Sinoatrial node dysfunction (HCC)    Syncope    Tricuspid valve disorder    Type 2 diabetes mellitus (HCC)    Type II    Tobacco Use: Social History   Tobacco Use  Smoking Status Former   Current packs/day: 0.00   Average packs/day: 3.0 packs/day for 26.0 years (77.9 ttl pk-yrs)   Types: Cigarettes   Start date: 02/20/1958   Quit date: 02/12/1984   Years since quitting: 39.9  Smokeless Tobacco Former   Types: Chew  Tobacco Comments   Former smoker 08/30/2021    Labs: Review Flowsheet  More data exists      Latest Ref Rng & Units 10/20/2016 08/13/2017 09/18/2019 08/28/2023 09/01/2023  Labs for ITP Cardiac and Pulmonary Rehab  Cholestrol 0 - 200 mg/dL - - - - 847   LDL (calc) 0 - 99 mg/dL - - - - 73   HDL-C >59 mg/dL - - - - 34   Trlycerides <150 mg/dL - - - - 775   Hemoglobin A1c 4.8 - 5.6 % 8.1  7.2  7.2  - -  PH, Arterial 7.35 - 7.45 - - - 7.438  -  PCO2 arterial 32 - 48 mmHg - - - 32.7  -  Bicarbonate 20.0 - 28.0 mmol/L - - - 22.1  25.0  24.7  -  TCO2 22 - 32 mmol/L - - - 23  26  26   -  Acid-base deficit 0.0 - 2.0 mmol/L - - - 1.0  -  O2 Saturation % - - - 95  65  64  -    Details       Multiple values from one day are sorted in reverse-chronological order         Capillary Blood Glucose: Lab Results  Component Value Date   GLUCAP 139 (H) 09/09/2023   GLUCAP 207 (H) 09/09/2023   GLUCAP 106 (H) 09/07/2023   GLUCAP 119 (H) 09/07/2023   GLUCAP 130 (H) 09/04/2023     Pulmonary Assessment Scores:  Pulmonary Assessment Scores     Row Name 09/04/23 0821          ADL UCSD   ADL Phase Entry     SOB Score total 47     Rest  1     Walk 3     Stairs 3     Bath 1     Dress 2     Shop 2       CAT Score   CAT Score 16       mMRC Score   mMRC Score 1             UCSD: Self-administered rating of dyspnea associated with activities of daily living (ADLs) 6-point scale (0 = not at all to 5 = maximal or unable to do because of breathlessness)  Scoring Scores range from 0 to 120.  Minimally important difference is 5 units  CAT: CAT can identify the health impairment of COPD patients and is better correlated with disease progression.  CAT has a scoring range of zero to 40. The CAT score is classified into four groups of low (less than 10), medium (10 - 20), high (21-30) and very high (31-40) based on the impact level of disease on health status. A CAT score over 10 suggests significant symptoms.  A worsening CAT score could be explained by an exacerbation, poor medication adherence, poor inhaler technique, or progression of COPD or comorbid conditions.  CAT MCID is 2 points  mMRC: mMRC (Modified Medical Research Council) Dyspnea Scale is used to assess the degree of baseline functional disability in patients of respiratory disease due to dyspnea. No minimal important difference is established. A decrease in score of 1 point or greater is considered a positive change.   Pulmonary Function Assessment:   Exercise Target Goals: Exercise Program Goal: Individual exercise prescription set using results from initial 6 min walk test and THRR while considering  patient's activity barriers and safety.   Exercise Prescription Goal: Initial exercise prescription builds to 30-45 minutes a day of aerobic activity, 2-3 days per week.  Home exercise guidelines will be given to patient during program as part of exercise prescription that the participant will acknowledge.  Activity Barriers & Risk Stratification:  Activity Barriers & Cardiac Risk  Stratification - 09/03/23 0844       Activity Barriers & Cardiac Risk Stratification   Activity Barriers Deconditioning;Muscular Weakness;Shortness of Breath;Other (comment);Arthritis;Left Knee Replacement;Right Knee Replacement;Joint Problems;Balance Concerns;History of Falls;Decreased Ventricular Function    Comments grip effected by arthritis, bilateral knee arth, and shoulders             6 Minute Walk:  6 Minute Walk     Row Name 09/03/23 1012         6 Minute Walk   Phase Initial     Distance 1120 feet     Walk Time 5.17 minutes     # of Rest Breaks 1  50 sec     MPH 2.46     METS 1.69     RPE 14     Perceived Dyspnea  2     VO2 Peak 5.91     Symptoms Yes (comment)     Comments SOB, calves painful 8/10     Resting HR 60 bpm     Resting BP 124/72     Resting Oxygen  Saturation  92 %     Exercise Oxygen  Saturation  during 6 min walk 87 %     Max Ex. HR 64 bpm     Max Ex. BP 146/64     2 Minute Post BP 132/70       Interval HR   1 Minute HR 60     2  Minute HR 61     3 Minute HR 62     4 Minute HR 64     5 Minute HR 60     6 Minute HR 61     2 Minute Post HR 60     Interval Heart Rate? Yes       Interval Oxygen    Interval Oxygen ? Yes     Baseline Oxygen  Saturation % 92 %     1 Minute Oxygen  Saturation % 87 %     1 Minute Liters of Oxygen  4 L  pulsed     2 Minute Oxygen  Saturation % 87 %     2 Minute Liters of Oxygen  4 L     3 Minute Oxygen  Saturation % 88 %     3 Minute Liters of Oxygen  4 L     4 Minute Oxygen  Saturation % 89 %     4 Minute Liters of Oxygen  4 L     5 Minute Oxygen  Saturation % 87 %     5 Minute Liters of Oxygen  4 L     6 Minute Oxygen  Saturation % 88 %     6 Minute Liters of Oxygen  4 L     2 Minute Post Oxygen  Saturation % 90 %     2 Minute Post Liters of Oxygen  4 L              Oxygen  Initial Assessment:  Oxygen  Initial Assessment - 09/03/23 0849       Home Oxygen    Home Oxygen  Device Portable Concentrator;Home  Concentrator    Sleep Oxygen  Prescription BiPAP;Continuous    Liters per minute 4    Home Exercise Oxygen  Prescription Continuous    Liters per minute 4    Home Resting Oxygen  Prescription Continuous    Liters per minute 3    Compliance with Home Oxygen  Use Yes      Initial 6 min Walk   Oxygen  Used Pulsed;Portable Concentrator    Liters per minute 4      Program Oxygen  Prescription   Program Oxygen  Prescription Continuous;E-Tanks    Liters per minute 3      Intervention   Short Term Goals To learn and exhibit compliance with exercise, home and travel O2 prescription;To learn and understand importance of monitoring SPO2 with pulse oximeter and demonstrate accurate use of the pulse oximeter.;To learn and understand importance of maintaining oxygen  saturations>88%;To learn and demonstrate proper pursed lip breathing techniques or other breathing techniques. ;To learn and demonstrate proper use of respiratory medications    Long  Term Goals Exhibits compliance with exercise, home  and travel O2 prescription;Maintenance of O2 saturations>88%;Compliance with respiratory medication;Verbalizes importance of monitoring SPO2 with pulse oximeter and return demonstration;Exhibits proper breathing techniques, such as pursed lip breathing or other method taught during program session;Demonstrates proper use of MDI's             Oxygen  Re-Evaluation:  Oxygen  Re-Evaluation     Row Name 09/03/23 1049 09/16/23 0838 10/21/23 0810 11/06/23 0834       Program Oxygen  Prescription   Program Oxygen  Prescription -- Continuous;E-Tanks Continuous;E-Tanks Continuous;E-Tanks    Liters per minute -- 6 6 6     Comments -- Zell is on 6 liters when exercising -- --      Home Oxygen    Home Oxygen  Device -- Portable Concentrator;Home Concentrator Portable Concentrator;Home Concentrator Portable Concentrator;Home Concentrator    Sleep Oxygen  Prescription -- BiPAP;Continuous BiPAP;Continuous BiPAP;Continuous  Liters per minute -- 4 4 4     Home Exercise Oxygen  Prescription -- Continuous Continuous Continuous    Liters per minute -- 4 4 4     Home Resting Oxygen  Prescription -- Continuous Continuous Continuous    Liters per minute -- 3 4 4     Compliance with Home Oxygen  Use -- Yes Yes Yes      Goals/Expected Outcomes   Short Term Goals To learn and demonstrate proper pursed lip breathing techniques or other breathing techniques.  To learn and demonstrate proper pursed lip breathing techniques or other breathing techniques.  To learn and exhibit compliance with exercise, home and travel O2 prescription;To learn and understand importance of monitoring SPO2 with pulse oximeter and demonstrate accurate use of the pulse oximeter.;To learn and understand importance of maintaining oxygen  saturations>88%;To learn and demonstrate proper pursed lip breathing techniques or other breathing techniques.  To learn and exhibit compliance with exercise, home and travel O2 prescription;To learn and understand importance of monitoring SPO2 with pulse oximeter and demonstrate accurate use of the pulse oximeter.;To learn and understand importance of maintaining oxygen  saturations>88%;To learn and demonstrate proper pursed lip breathing techniques or other breathing techniques.     Long  Term Goals Exhibits proper breathing techniques, such as pursed lip breathing or other method taught during program session Exhibits proper breathing techniques, such as pursed lip breathing or other method taught during program session Exhibits compliance with exercise, home  and travel O2 prescription;Verbalizes importance of monitoring SPO2 with pulse oximeter and return demonstration;Maintenance of O2 saturations>88%;Exhibits proper breathing techniques, such as pursed lip breathing or other method taught during program session;Compliance with respiratory medication Exhibits compliance with exercise, home  and travel O2 prescription;Verbalizes  importance of monitoring SPO2 with pulse oximeter and return demonstration;Maintenance of O2 saturations>88%;Exhibits proper breathing techniques, such as pursed lip breathing or other method taught during program session;Compliance with respiratory medication    Comments Reviewed PLB technique with pt.  Talked about how it works and it's importance in maintaining their exercise saturations. Zell has noticed an slight increase in hois breathing. He is able to go around the grocery store without giving out and being SOB. He uses PLB when he feels SOB and it has helped him bring his oxygen  levels up. He checks his levels during the day and stated that they stay WNL. Zell is doing well in rehab.  He was out last week with a sinus infection and is having a hard time with his breathing from his sinuses.  They are starting to feel better but now struggling with side effects from antibiotic.  He is doing well with his PLB and pulm meds.  He is compliant with oxygen  and is now on 6L since infection continuously.  He hopes to be able to drop back to 4L for rest once recovered.  He had a breathing test yesterday with various test done.  He felt like he had worked out for an hour. Zell is doing well in rehab.  He is compliant with his oxygen  and BiPap at night.  He says he doesn't sleep without it.  He is using PLB and doing well on meds.  He is supposed to start a new nebulizer over weekend and will bring us  the name of it next week.  He is a little worried about the side effects of nebulizer.    Goals/Expected Outcomes Short: Become more profiecient at using PLB.   Long: Become independent at using PLB. Short: continue to monitor oxygen   levels   long term: continue to be compliant with oxygen  Short: Continue to work on breathing and recovery Long: conitnued compliance Short; Try out new nebulizer Long: conitnue to be compliant             Oxygen  Discharge (Final Oxygen  Re-Evaluation):  Oxygen  Re-Evaluation -  11/06/23 0834       Program Oxygen  Prescription   Program Oxygen  Prescription Continuous;E-Tanks    Liters per minute 6      Home Oxygen    Home Oxygen  Device Portable Concentrator;Home Concentrator    Sleep Oxygen  Prescription BiPAP;Continuous    Liters per minute 4    Home Exercise Oxygen  Prescription Continuous    Liters per minute 4    Home Resting Oxygen  Prescription Continuous    Liters per minute 4    Compliance with Home Oxygen  Use Yes      Goals/Expected Outcomes   Short Term Goals To learn and exhibit compliance with exercise, home and travel O2 prescription;To learn and understand importance of monitoring SPO2 with pulse oximeter and demonstrate accurate use of the pulse oximeter.;To learn and understand importance of maintaining oxygen  saturations>88%;To learn and demonstrate proper pursed lip breathing techniques or other breathing techniques.     Long  Term Goals Exhibits compliance with exercise, home  and travel O2 prescription;Verbalizes importance of monitoring SPO2 with pulse oximeter and return demonstration;Maintenance of O2 saturations>88%;Exhibits proper breathing techniques, such as pursed lip breathing or other method taught during program session;Compliance with respiratory medication    Comments Zell is doing well in rehab.  He is compliant with his oxygen  and BiPap at night.  He says he doesn't sleep without it.  He is using PLB and doing well on meds.  He is supposed to start a new nebulizer over weekend and will bring us  the name of it next week.  He is a little worried about the side effects of nebulizer.    Goals/Expected Outcomes Short; Try out new nebulizer Long: conitnue to be compliant             Initial Exercise Prescription:  Initial Exercise Prescription - 09/03/23 1000       Date of Initial Exercise RX and Referring Provider   Date 09/03/23    Referring Provider Rolan Barrack MD      Oxygen    Oxygen  Continuous    Liters 4    Maintain  Oxygen  Saturation 88% or higher      Treadmill   MPH 2.2    Grade 0.5    Minutes 15    METs 2.84      REL-XR   Level 3    Speed 50    Minutes 15    METs 2.3      Prescription Details   Frequency (times per week) 3    Duration Progress to 30 minutes of continuous aerobic without signs/symptoms of physical distress      Intensity   THRR 40-80% of Max Heartrate 93-126    Ratings of Perceived Exertion 11-13    Perceived Dyspnea 0-4      Progression   Progression Continue to progress workloads to maintain intensity without signs/symptoms of physical distress.      Resistance Training   Training Prescription Yes    Weight 5 lb    Reps 10-15             Perform Capillary Blood Glucose checks as needed.  Exercise Prescription Changes:   Exercise Prescription Changes  Row Name 09/03/23 1000 09/21/23 1300 10/07/23 1300 10/21/23 1200 11/02/23 1200     Response to Exercise   Blood Pressure (Admit) 124/72 132/74 136/60 112/60 134/60   Blood Pressure (Exercise) 146/64 108/58 -- -- --   Blood Pressure (Exit) 120/62 128/58 126/74 114/62 120/60   Heart Rate (Admit) 60 bpm 60 bpm 1 bpm 60 bpm 60 bpm   Heart Rate (Exercise) 64 bpm 60 bpm 61 bpm 60 bpm 60 bpm   Heart Rate (Exit) 60 bpm 60 bpm 61 bpm 60 bpm 60 bpm   Oxygen  Saturation (Admit) 92 % 90 % 95 % 94 % 94 %   Oxygen  Saturation (Exercise) 87 % 88 % 89 % 91 % 90 %   Oxygen  Saturation (Exit) 90 % 97 % 90 % 94 % 93 %   Rating of Perceived Exertion (Exercise) 14 13 12 13 12    Perceived Dyspnea (Exercise) 2 2 1 2 1    Symptoms SOB, calves pain 8/10 -- -- -- --   Comments walk test results -- -- -- --   Duration -- Continue with 30 min of aerobic exercise without signs/symptoms of physical distress. Continue with 30 min of aerobic exercise without signs/symptoms of physical distress. Continue with 30 min of aerobic exercise without signs/symptoms of physical distress. Continue with 30 min of aerobic exercise without  signs/symptoms of physical distress.   Intensity -- THRR unchanged THRR unchanged THRR unchanged THRR unchanged     Progression   Progression -- Continue to progress workloads to maintain intensity without signs/symptoms of physical distress. Continue to progress workloads to maintain intensity without signs/symptoms of physical distress. Continue to progress workloads to maintain intensity without signs/symptoms of physical distress. Continue to progress workloads to maintain intensity without signs/symptoms of physical distress.     Resistance Training   Training Prescription -- Yes Yes Yes Yes   Weight -- 5 lbs 5 lbs 5 lbs 5 lbs   Reps -- 10-15 10-15 10-15 10-15   Time -- 1 Minutes -- -- --     Oxygen    Oxygen  -- Continuous Continuous Continuous Continuous   Liters -- 4 6 6 6      Treadmill   MPH -- 2 2 2 2    Grade -- 0.5 0 0 0   Minutes -- 15 15 15 15    METs -- 2.67 2.53 2.53 2.53     REL-XR   Level -- 3 2 2 2    Speed -- 51 53 54 55   Minutes -- 15 15 15 15    METs -- 2.5 2.4 2.6 2.4     Oxygen    Maintain Oxygen  Saturation -- 88% or higher 88% or higher 88% or higher 88% or higher    Row Name 11/16/23 1500 12/01/23 0700 12/14/23 1300         Response to Exercise   Blood Pressure (Admit) 132/64 140/60 110/60     Blood Pressure (Exit) 98/64 136/64 110/62     Heart Rate (Admit) 60 bpm 60 bpm 60 bpm     Heart Rate (Exercise) 65 bpm 63 bpm 69 bpm     Heart Rate (Exit) 61 bpm 60 bpm 60 bpm     Oxygen  Saturation (Admit) 95 % 93 % 96 %     Oxygen  Saturation (Exercise) 90 % 91 % 90 %     Oxygen  Saturation (Exit) 96 % 92 % 96 %     Rating of Perceived Exertion (Exercise) 12 13 12  Perceived Dyspnea (Exercise) 1 2 1      Duration Continue with 30 min of aerobic exercise without signs/symptoms of physical distress. Continue with 30 min of aerobic exercise without signs/symptoms of physical distress. Continue with 30 min of aerobic exercise without signs/symptoms of physical  distress.     Intensity THRR unchanged THRR unchanged THRR unchanged       Progression   Progression Continue to progress workloads to maintain intensity without signs/symptoms of physical distress. Continue to progress workloads to maintain intensity without signs/symptoms of physical distress. Continue to progress workloads to maintain intensity without signs/symptoms of physical distress.       Resistance Training   Training Prescription Yes Yes Yes     Weight 5 lbs 5lbs 5 lbs     Reps 10-15 10-15 10-15       Oxygen    Oxygen  Continuous Continuous Continuous     Liters 6 6 6        Treadmill   MPH 2 2 2      Grade 0 0 0     Minutes 15 15 15      METs 2.53 2.53 2.53       REL-XR   Level 2 2 2      Speed 56 54 54     Minutes 15 15 15      METs 2.7 2.5 2.7       Oxygen    Maintain Oxygen  Saturation 88% or higher 88% or higher 88% or higher              Exercise Comments:   Exercise Goals and Review:   Exercise Goals     Row Name 09/03/23 1031             Exercise Goals   Increase Physical Activity Yes       Intervention Provide advice, education, support and counseling about physical activity/exercise needs.;Develop an individualized exercise prescription for aerobic and resistive training based on initial evaluation findings, risk stratification, comorbidities and participant's personal goals.       Expected Outcomes Short Term: Attend rehab on a regular basis to increase amount of physical activity.;Long Term: Add in home exercise to make exercise part of routine and to increase amount of physical activity.;Long Term: Exercising regularly at least 3-5 days a week.       Increase Strength and Stamina Yes       Intervention Provide advice, education, support and counseling about physical activity/exercise needs.;Develop an individualized exercise prescription for aerobic and resistive training based on initial evaluation findings, risk stratification, comorbidities and  participant's personal goals.       Expected Outcomes Short Term: Increase workloads from initial exercise prescription for resistance, speed, and METs.;Short Term: Perform resistance training exercises routinely during rehab and add in resistance training at home;Long Term: Improve cardiorespiratory fitness, muscular endurance and strength as measured by increased METs and functional capacity ( )       Able to understand and use rate of perceived exertion (RPE) scale Yes       Intervention Provide education and explanation on how to use RPE scale       Expected Outcomes Short Term: Able to use RPE daily in rehab to express subjective intensity level;Long Term:  Able to use RPE to guide intensity level when exercising independently       Able to understand and use Dyspnea scale Yes       Intervention Provide education and explanation on how to use Dyspnea scale  Expected Outcomes Short Term: Able to use Dyspnea scale daily in rehab to express subjective sense of shortness of breath during exertion;Long Term: Able to use Dyspnea scale to guide intensity level when exercising independently       Knowledge and understanding of Target Heart Rate Range (THRR) Yes       Intervention Provide education and explanation of THRR including how the numbers were predicted and where they are located for reference       Expected Outcomes Short Term: Able to state/look up THRR;Long Term: Able to use THRR to govern intensity when exercising independently;Short Term: Able to use daily as guideline for intensity in rehab       Able to check pulse independently Yes       Intervention Provide education and demonstration on how to check pulse in carotid and radial arteries.;Review the importance of being able to check your own pulse for safety during independent exercise       Expected Outcomes Short Term: Able to explain why pulse checking is important during independent exercise;Long Term: Able to check pulse  independently and accurately       Understanding of Exercise Prescription Yes       Intervention Provide education, explanation, and written materials on patient's individual exercise prescription       Expected Outcomes Long Term: Able to explain home exercise prescription to exercise independently;Short Term: Able to explain program exercise prescription                Exercise Goals Re-Evaluation :  Exercise Goals Re-Evaluation     Row Name 09/03/23 1032 09/16/23 0805 09/18/23 0759 09/21/23 1336 10/07/23 1316     Exercise Goal Re-Evaluation   Exercise Goals Review Able to understand and use rate of perceived exertion (RPE) scale;Able to understand and use Dyspnea scale;Understanding of Exercise Prescription Increase Physical Activity;Increase Strength and Stamina;Understanding of Exercise Prescription Increase Physical Activity;Increase Strength and Stamina;Able to understand and use rate of perceived exertion (RPE) scale;Able to understand and use Dyspnea scale;Knowledge and understanding of Target Heart Rate Range (THRR);Able to check pulse independently;Understanding of Exercise Prescription Increase Physical Activity;Increase Strength and Stamina;Understanding of Exercise Prescription Increase Physical Activity;Increase Strength and Stamina;Understanding of Exercise Prescription   Comments Reviewed RPE  and dyspnea scale and program prescription with pt today.  Pt voiced understanding and was given a copy of goals to take home. Zell has been doing good with rehab. Zell has noticed as slight improvment since starting the program. He was able to go around the grocery store and back to his car without giving out. He does have to stop for short breaks when on the treadmill due to RLE pain. HE also likes coming to class for social. Zell is off to a good start in rehab.  He has already started to notice an improvement in his stamina and feeling more clear headed.  Reviewed home exercise with pt  today.  Pt plans to walk and use weights at home for exercise.  Reviewed THR, pulse, RPE, sign and symptoms, pulse oximetery and when to call 911 or MD.  Also discussed weather considerations and indoor options.  Pt voiced understanding. Zell has been tolerating exercise well. He has to take breaks on the treadmill due to calf pain. He is walking at a speed/grade of 2.0/0.5. He has increased his level on the XR to level 3. Will continue to monitor and progress as able. Zell has been tolerating exercise well. He continues to take breaks  on the treadmill due to calf pain. He increased his oxygen  level to 6 liters today to improve breathing sue to having a bad day. Will continue to monitor and progress asa able.   Expected Outcomes Short: Use RPE daily to regulate intensity.  Long: Follow program prescription Short: go over home exericse   long: continue to attend pulmponary rehab. Short: Start to add in more walking at home Long: Conitnue to improve stamina Short term: continue to exercise at current workloads until he builds resisitance   long term: continue to attend rehab Short term: continue to exercise at current workloads until he builds resisitance   long term: continue to attend rehab    Row Name 10/21/23 0816 11/03/23 0741 11/06/23 0833 11/16/23 1543 11/16/23 1544     Exercise Goal Re-Evaluation   Exercise Goals Review Increase Physical Activity;Increase Strength and Stamina;Understanding of Exercise Prescription Increase Physical Activity;Able to understand and use rate of perceived exertion (RPE) scale;Understanding of Exercise Prescription Increase Strength and Stamina;Increase Physical Activity;Understanding of Exercise Prescription Increase Physical Activity;Increase Strength and Stamina;Understanding of Exercise Prescription --   Comments Zell is doing well in rehab. He was out last week from a sinus infection.  He was able to walk some at home while he was out.  Yesterday they had him in for a  breathing test and he feels like he got a good workout from that.  He was feeling better until he got sick. Zell is doing good in rehab. He has not increased his level on the XR or treadmill in the last two weeks. He is ;eve; 2 on the XR  and speed 2.0 on the treadmill with an RPE of 12. He does have to take breaks on the treadmill due to his legs cramping and feeling tired. He is curretnly exercising at 2.53 METs on  the treadmill. Will continue to monitor and progress as able. Zell is doing well in rehab. He is staying active at home and moving a lot.  He will also do weights at home.  He is feeling better overall. Zell is tolerating exericse well. He has Zell is tolerating exericse well. He has not increased on his levels in the last two weeks. He is exercising at 2.7 METs on the XR at level 2 and a RPE of 12. WIll continue to monitoe and progress as able.   Expected Outcomes Short: Get back to routine of exercise Long: continue to improve stamina Short: increase to level 3 on the XR  long term: continue to attend rehab short; continue to exercise on off days Long; continue to stay activie -- Short: increase level on XR to level 3   long term: continue to exericse at rehab and home            Discharge Exercise Prescription (Final Exercise Prescription Changes):  Exercise Prescription Changes - 12/14/23 1300       Response to Exercise   Blood Pressure (Admit) 110/60    Blood Pressure (Exit) 110/62    Heart Rate (Admit) 60 bpm    Heart Rate (Exercise) 69 bpm    Heart Rate (Exit) 60 bpm    Oxygen  Saturation (Admit) 96 %    Oxygen  Saturation (Exercise) 90 %    Oxygen  Saturation (Exit) 96 %    Rating of Perceived Exertion (Exercise) 12    Perceived Dyspnea (Exercise) 1    Duration Continue with 30 min of aerobic exercise without signs/symptoms of physical distress.    Intensity THRR  unchanged      Progression   Progression Continue to progress workloads to maintain intensity without  signs/symptoms of physical distress.      Resistance Training   Training Prescription Yes    Weight 5 lbs    Reps 10-15      Oxygen    Oxygen  Continuous    Liters 6      Treadmill   MPH 2    Grade 0    Minutes 15    METs 2.53      REL-XR   Level 2    Speed 54    Minutes 15    METs 2.7      Oxygen    Maintain Oxygen  Saturation 88% or higher             Nutrition:  Target Goals: Understanding of nutrition guidelines, daily intake of sodium 1500mg , cholesterol 200mg , calories 30% from fat and 7% or less from saturated fats, daily to have 5 or more servings of fruits and vegetables.  Biometrics:  Pre Biometrics - 09/03/23 1033       Pre Biometrics   Height 5' 9.5 (1.765 m)    Weight 219 lb 14.4 oz (99.7 kg)    Waist Circumference 44 inches    Hip Circumference 42.5 inches    Waist to Hip Ratio 1.04 %    BMI (Calculated) 32.02    Grip Strength 21.7 kg    Single Leg Stand 2.3 seconds              Nutrition Therapy Plan and Nutrition Goals:  Nutrition Therapy & Goals - 09/03/23 0853       Intervention Plan   Intervention Prescribe, educate and counsel regarding individualized specific dietary modifications aiming towards targeted core components such as weight, hypertension, lipid management, diabetes, heart failure and other comorbidities.    Expected Outcomes Short Term Goal: Understand basic principles of dietary content, such as calories, fat, sodium, cholesterol and nutrients.;Long Term Goal: Adherence to prescribed nutrition plan.             Nutrition Assessments:  MEDIFICTS Score Key: >=70 Need to make dietary changes  40-70 Heart Healthy Diet <= 40 Therapeutic Level Cholesterol Diet  Flowsheet Row PULMONARY REHAB OTHER RESPIRATORY from 09/04/2023 in [facility]  Picture Your Plate Total Score on Admission 58      Picture Your Plate Scores: <59 Unhealthy dietary pattern with much room for improvement. 41-50  Dietary pattern unlikely to meet recommendations for good health and room for improvement. 51-60 More healthful dietary pattern, with some room for improvement.  >60 Healthy dietary pattern, although there may be some specific behaviors that could be improved.    Nutrition Goals Re-Evaluation:  Nutrition Goals Re-Evaluation     Row Name 09/16/23 [phone] 10/21/23 0819 11/06/23 0828         Goals   Nutrition Goal Healthy eating Short term: continue to eat smaller portions long term: continue to eat healthy Short: Get appetite back Long: Continue to eat healthy     Comment Zell stated that he eats a lot of chicken throughout that week, he will have some beef and fish every once in a while. He also eats lots of veggies with his meals and also will do salads. He normally eats eggs some bacon and toast for breaksfest. He has been eating smaller portions, more veggies in the last 6 months and has lost about 20 lbs. He has cut down on sweets but does  eat a hersey bar during the week but has not had cake and pie in a while. Zell is doing well in rehab. He has not been able to eat and feels bad on his antibiotic from his sinus infection.  He was able to eat last night for first time in a week.  He wants to get to feeling better again. His sister that lives with him helps him stay focused on eating healthy too. Zell is doing well in rehab.  He is still eating with a group and eats more than he was than if he was still by himself.  He appetitie has gotten better.     Expected Outcome Short term: continue to eat smaller portions   long term: continue to eat healthy Short: Get appetite back Long: Continue to eat healthy Short: Continue to eat balance meals Long: Continue to be healthy              Nutrition Goals Discharge (Final Nutrition Goals Re-Evaluation):  Nutrition Goals Re-Evaluation - 11/06/23 0828       Goals   Nutrition Goal Short: Get appetite back Long: Continue to eat healthy    Comment  Zell is doing well in rehab.  He is still eating with a group and eats more than he was than if he was still by himself.  He appetitie has gotten better.    Expected Outcome Short: Continue to eat balance meals Long: Continue to be healthy             Psychosocial: Target Goals: Acknowledge presence or absence of significant depression and/or stress, maximize coping skills, provide positive support system. Participant is able to verbalize types and ability to use techniques and skills needed for reducing stress and depression.  Initial Review & Psychosocial Screening:  Initial Psych Review & Screening - 09/03/23 0853       Initial Review   Current issues with Current Stress Concerns    Source of Stress Concerns Chronic Illness;Unable to perform yard/household activities;Unable to participate in former interests or hobbies;Family    Comments dealing with not being able to go and do like he wants to, loss two wives, last one in March, sister and husband living with them      Family Dynamics   Good Support System? Yes   daughter in law and grandkids live near by, sister and husband living with him currently     Barriers   Psychosocial barriers to participate in program The patient should benefit from training in stress management and relaxation.;Psychosocial barriers identified (see note)      Screening Interventions   Interventions Encouraged to exercise;To provide support and resources with identified psychosocial needs;Provide feedback about the scores to participant    Expected Outcomes Short Term goal: Utilizing psychosocial counselor, staff and physician to assist with identification of specific Stressors or current issues interfering with healing process. Setting desired goal for each stressor or current issue identified.;Long Term Goal: Stressors or current issues are controlled or eliminated.;Short Term goal: Identification and review with participant of any Quality of Life or  Depression concerns found by scoring the questionnaire.;Long Term goal: The participant improves quality of Life and PHQ9 Scores as seen by post scores and/or verbalization of changes             Quality of Life Scores:  Scores of 19 and below usually indicate a poorer quality of life in these areas.  A difference of  2-3 points is a clinically meaningful  difference.  A difference of 2-3 points in the total score of the Quality of Life Index has been associated with significant improvement in overall quality of life, self-image, physical symptoms, and general health in studies assessing change in quality of life.   PHQ-9: Review Flowsheet       09/03/2023  Depression screen PHQ 2/9  Decreased Interest 0  Down, Depressed, Hopeless 0  PHQ - 2 Score 0  Altered sleeping 0  Tired, decreased energy 0  Change in appetite 0  Feeling bad or failure about yourself  0  Trouble concentrating 0  Moving slowly or fidgety/restless 0  Suicidal thoughts 0  PHQ-9 Score 0  Difficult doing work/chores Not difficult at all   Interpretation of Total Score  Total Score Depression Severity:  1-4 = Minimal depression, 5-9 = Mild depression, 10-14 = Moderate depression, 15-19 = Moderately severe depression, 20-27 = Severe depression   Psychosocial Evaluation and Intervention:  Psychosocial Evaluation - 09/03/23 0858       Psychosocial Evaluation & Interventions   Interventions Stress management education;Encouraged to exercise with the program and follow exercise prescription    Comments Zell is coming into pulmonary rehab for pulmonary hypertension.  He is a happy and positive person and eager to get going in the program. He had a bad fall back in April over his oxygen  tubing and broke three ribs and collapsed his lung.  Since his fall, he has had to rely on his oxygen  more.  Previously, he could go a few hours around the house without it, but now he is on 3-4L constantly for any activity.  He has no  history of anixety or depression but lost his 2nd wife to cancer in March of 2023 (1st wife to cancer in 2016).  He enjoys haning out with friends to play card and going to [employer] to read books.   He used to go to [facility] for exercise but the oxygen  feels [place] to him currently but he would like to get back there.  His sister and her husband are currently living with him after they lost their condo, but he enjoys their company and meals together.  He has been working on weight loss and has lost 20 lb since June.  He would like to get down under 200 lb.  He has a new concentrator for oxygen  that seems to be working better for him and his breathing.  He wants to get back to whre he was before his fall in April.  He would also like to try to tritrate down on his oxygen  needs for activity.  He also has a daughter in law that lives locally with two grandsons that help with yard work.  They also go out to eat at least once a week together.  He has only been doing some weights and walking in the house for acitivity and would like to get back to more regular exercise again.    Expected Outcomes Short: Attend rehab to build stamina and improve breathing Long: Continue to focus on positives and wean oxygen     Continue Psychosocial Services  Follow up required by staff             Psychosocial Re-Evaluation:  Psychosocial Re-Evaluation     Row Name 09/16/23 0813 09/18/23 0802 10/21/23 0808 11/06/23 0820       Psychosocial Re-Evaluation   Current issues with None Identified None Identified;Current Stress Concerns Current Stress Concerns Current Stress Concerns  Comments Zell stated that he has not had any issue with sleep or any major stress factors. He said he has been feeling more energized since starting the program. He talks to his kids and grandkids often and it bring him joy -- Zell is doing well in rehab.  He was out last week with a sinus infection.  He is now on an antibiotic for 14 days which  has him feeling bad.  He is here today, but not feeling the best.  He continues to look out for family and enjoy his grandkids. Zell is doing well in rehab.  He still has his sister living with him and they are eating more.  He is enjoying his sister, but says his brother in law is strange.  He is enjoying the company.  He is feeling better from his sinuses but still bother him some.  He is sleeping well.    Expected Outcomes Short: continue to identify stressors    long term: continue to find ways that make him happy -- Short: Conitnue to recover from infection Long: conitnue to focus on the positives. Short: Continue to enjoy sister Long: Continue to stay positive    Interventions Stress management education;Relaxation education;Encouraged to attend Pulmonary Rehabilitation for the exercise -- Stress management education;Relaxation education;Encouraged to attend Pulmonary Rehabilitation for the exercise Relaxation education;Stress management education;Encouraged to attend Pulmonary Rehabilitation for the exercise    Continue Psychosocial Services  Follow up required by staff -- Follow up required by staff --             Psychosocial Discharge (Final Psychosocial Re-Evaluation):  Psychosocial Re-Evaluation - 11/06/23 0820       Psychosocial Re-Evaluation   Current issues with Current Stress Concerns    Comments Zell is doing well in rehab.  He still has his sister living with him and they are eating more.  He is enjoying his sister, but says his brother in law is strange.  He is enjoying the company.  He is feeling better from his sinuses but still bother him some.  He is sleeping well.    Expected Outcomes Short: Continue to enjoy sister Long: Continue to stay positive    Interventions Relaxation education;Stress management education;Encouraged to attend Pulmonary Rehabilitation for the exercise              Education: Education Goals: Education classes will be provided on a weekly  basis, covering required topics. Participant will state understanding/return demonstration of topics presented.  Learning Barriers/Preferences:  Learning Barriers/Preferences - 09/03/23 1040       Learning Barriers/Preferences   Learning Barriers Sight   contacts   Learning Preferences None             Education Topics: How Lungs Work and Diseases: - Discuss the anatomy of the lungs and diseases that can affect the lungs, such as COPD. Flowsheet Row PULMONARY REHAB OTHER RESPIRATORY from 12/09/2023 in [facility]  Date 11/11/23  Educator [facility]  Instruction Review Code 1- Verbalizes Understanding       Exercise: -Discuss the importance of exercise, FITT principles of exercise, normal and abnormal responses to exercise, and how to exercise safely.   Environmental Irritants: -Discuss types of environmental irritants and how to limit exposure to environmental irritants.   Meds/Inhalers and oxygen : - Discuss respiratory medications, definition of an inhaler and oxygen , and the proper way to use an inhaler and oxygen .   Energy Saving Techniques: - Discuss methods to conserve energy and  decrease shortness of breath when performing activities of daily living.    Bronchial Hygiene / Breathing Techniques: - Discuss breathing mechanics, pursed-lip breathing technique,  proper posture, effective ways to clear airways, and other functional breathing techniques   Cleaning Equipment: - Provides group verbal and written instruction about the health risks of elevated stress, cause of high stress, and healthy ways to reduce stress.   Nutrition I: Fats: - Discuss the types of cholesterol, what cholesterol does to the body, and how cholesterol levels can be controlled.   Nutrition II: Labels: -Discuss the different components of food labels and how to read food labels.   Respiratory Infections: - Discuss the signs and symptoms of respiratory infections, ways  to prevent respiratory infections, and the importance of seeking medical treatment when having a respiratory infection.   Stress I: Signs and Symptoms: - Discuss the causes of stress, how stress may lead to anxiety and depression, and ways to limit stress. Flowsheet Row PULMONARY REHAB OTHER RESPIRATORY from 12/09/2023 in [facility]  Date 10/21/23  Educator [facility]  Instruction Review Code 1- Verbalizes Understanding       Stress II: Relaxation: -Discuss relaxation techniques to limit stress. Flowsheet Row PULMONARY REHAB OTHER RESPIRATORY from 12/09/2023 in [facility] [facility]  Date 10/21/23  Educator [facility]  Instruction Review Code 1- Verbalizes Understanding       Oxygen  for Home/Travel: - Discuss how to prepare for travel when on oxygen  and proper ways to transport and store oxygen  to ensure safety.   Knowledge Questionnaire Score:  Knowledge Questionnaire Score - 09/04/23 9178       Knowledge Questionnaire Score   Pre Score 18/18             Core Components/Risk Factors/Patient Goals at Admission:  Personal Goals and Risk Factors at Admission - 09/03/23 0852       Core Components/Risk Factors/Patient Goals on Admission    Weight Management Yes;Obesity;Weight Loss    Intervention Weight Management: Develop a combined nutrition and exercise program designed to reach desired caloric intake, while maintaining appropriate intake of nutrient and fiber, sodium and fats, and appropriate energy expenditure required for the weight goal.;Weight Management: Provide education and appropriate resources to help participant work on and attain dietary goals.;Weight Management/Obesity: Establish reasonable short term and long term weight goals.;Obesity: Provide education and appropriate resources to help participant work on and attain dietary goals.    Admit Weight 219 lb 14.4 oz (99.7 kg)    Goal Weight: Short Term 215 lb (97.5 kg)    Goal Weight: Long  Term 199 lb (90.3 kg)    Expected Outcomes Short Term: Continue to assess and modify interventions until short term weight is achieved;Long Term: Adherence to nutrition and physical activity/exercise program aimed toward attainment of established weight goal;Weight Loss: Understanding of general recommendations for a balanced deficit meal plan, which promotes 1-2 lb weight loss per week and includes a negative energy balance of [phone] kcal/d;Understanding recommendations for meals to include 15-35% energy as protein, 25-35% energy from fat, 35-60% energy from carbohydrates, less than 200mg  of dietary cholesterol, 20-35 gm of total fiber daily;Understanding of distribution of calorie intake throughout the day with the consumption of 4-5 meals/snacks    Improve shortness of breath with ADL's Yes    Intervention Provide education, individualized exercise plan and daily activity instruction to help decrease symptoms of SOB with activities of daily living.    Expected Outcomes Short Term: Improve cardiorespiratory fitness to achieve a  reduction of symptoms when performing ADLs;Long Term: Be able to perform more ADLs without symptoms or delay the onset of symptoms    Increase knowledge of respiratory medications and ability to use respiratory devices properly  Yes    Intervention Provide education and demonstration as needed of appropriate use of medications, inhalers, and oxygen  therapy.    Expected Outcomes Short Term: Achieves understanding of medications use. Understands that oxygen  is a medication prescribed by physician. Demonstrates appropriate use of inhaler and oxygen  therapy.;Long Term: Maintain appropriate use of medications, inhalers, and oxygen  therapy.    Diabetes Yes    Intervention Provide education about signs/symptoms and action to take for hypo/hyperglycemia.;Provide education about proper nutrition, including hydration, and aerobic/resistive exercise prescription along with prescribed  medications to achieve blood glucose in normal ranges: Fasting glucose 65-99 mg/dL    Expected Outcomes Short Term: Participant verbalizes understanding of the signs/symptoms and immediate care of hyper/hypoglycemia, proper foot care and importance of medication, aerobic/resistive exercise and nutrition plan for blood glucose control.;Long Term: Attainment of HbA1C < 7%.    Heart Failure Yes    Intervention Provide a combined exercise and nutrition program that is supplemented with education, support and counseling about heart failure. Directed toward relieving symptoms such as shortness of breath, decreased exercise tolerance, and extremity edema.    Expected Outcomes Improve functional capacity of life;Short term: Attendance in program 2-3 days a week with increased exercise capacity. Reported lower sodium intake. Reported increased fruit and vegetable intake. Reports medication compliance.;Short term: Daily weights obtained and reported for increase. Utilizing diuretic protocols set by physician.;Long term: Adoption of self-care skills and reduction of barriers for early signs and symptoms recognition and intervention leading to self-care maintenance.    Hypertension Yes    Intervention Provide education on lifestyle modifcations including regular physical activity/exercise, weight management, moderate sodium restriction and increased consumption of fresh fruit, vegetables, and low fat dairy, alcohol  moderation, and smoking cessation.;Monitor prescription use compliance.    Expected Outcomes Short Term: Continued assessment and intervention until BP is < 140/58mm HG in hypertensive participants. < 130/71mm HG in hypertensive participants with diabetes, heart failure or chronic kidney disease.;Long Term: Maintenance of blood pressure at goal levels.    Lipids Yes    Intervention Provide education and support for participant on nutrition & aerobic/resistive exercise along with prescribed medications to  achieve LDL 70mg , HDL >40mg .    Expected Outcomes Short Term: Participant states understanding of desired cholesterol values and is compliant with medications prescribed. Participant is following exercise prescription and nutrition guidelines.;Long Term: Cholesterol controlled with medications as prescribed, with individualized exercise RX and with personalized nutrition plan. Value goals: LDL < 70mg , HDL > 40 mg.             Core Components/Risk Factors/Patient Goals Review:   Goals and Risk Factor Review     Row Name 09/16/23 0831 10/21/23 0820 11/06/23 0816         Core Components/Risk Factors/Patient Goals Review   Personal Goals Review Weight Management/Obesity;Improve shortness of breath with ADL's Weight Management/Obesity;Improve shortness of breath with ADL's;Increase knowledge of respiratory medications and ability to use respiratory devices properly.;Heart Failure;Hypertension Weight Management/Obesity;Improve shortness of breath with ADL's;Increase knowledge of respiratory medications and ability to use respiratory devices properly.;Heart Failure;Hypertension     Review Zell has been managing his weight by watching what he is eating. He has cut down on portions of meals. He has also cut out pastas and most breads other them his morning toast.  He has also cut out alcohol . He has noticed improvment in his breathing when doing activities in town. He uses PLB when he feels SOB. Zell is doing well in rehab.  He was out last week with a sinus infection.  Despite not eating, his weight is up.  He has not been as active this week and drinking more water  so he may be retaining fluid.  He is good about using his PLB to help with his breathing.  He was doing better with his breathing before he got sick.  His pressures are doing well.  He had breathing test yesterday and is going to ENT today. Zell is doing well in rehab.  He is supposed to start a new nebulizer soon.  It is one of the new ones  out there and he doesn't know name of it.  He is supposed to get supplies on Saturday for it.  His doctor is hoping that it will help with his breathing.  Its supposed to work on two different enzymes.  His weight is trending down some.  He is still eating with the others.  He has not had any herat failure symptoms.  He still has some swelling but wearing his compression socks and using fluid pill.  His pressure are doing well.     Expected Outcomes Short term: Continue to manage waright   long term: continue to exercise for happiness Short: Get back to routine exercise Long: conitnue to monitor risk factors Short: Bring in name of new nebulizer Long: Continue to work on improving breathing              Core Components/Risk Factors/Patient Goals at Discharge (Final Review):   Goals and Risk Factor Review - 11/06/23 0816       Core Components/Risk Factors/Patient Goals Review   Personal Goals Review Weight Management/Obesity;Improve shortness of breath with ADL's;Increase knowledge of respiratory medications and ability to use respiratory devices properly.;Heart Failure;Hypertension    Review Zell is doing well in rehab.  He is supposed to start a new nebulizer soon.  It is one of the new ones out there and he doesn't know name of it.  He is supposed to get supplies on Saturday for it.  His doctor is hoping that it will help with his breathing.  Its supposed to work on two different enzymes.  His weight is trending down some.  He is still eating with the others.  He has not had any herat failure symptoms.  He still has some swelling but wearing his compression socks and using fluid pill.  His pressure are doing well.    Expected Outcomes Short: Bring in name of new nebulizer Long: Continue to work on improving breathing             ITP Comments:  ITP Comments     Row Name 09/03/23 1011 09/04/23 0801 09/09/23 0750 10/07/23 0639 11/04/23 0646   ITP Comments Patient attend orientation today.   Patient is attendingPulmonary Rehabilitation Program.  Documentation for diagnosis can be found in [facility] OV 09/01/23.  Reviewed medical chart, RPE/RPD, gym safety, and program guidelines.  Patient was fitted to equipment they will be using during rehab.  Patient is scheduled to start exercise on 09/04/23 at 745.   Initial ITP created and sent for review and signature by Dr. Anton Kelp, Medical Director for Pulmonary Rehabilitation Program. First full day of exercise!  Patient was oriented to gym and equipment including functions, settings, policies, and procedures.  Patient's individual exercise prescription and treatment plan were reviewed.  All starting workloads were established based on the results of the 6 minute walk test done at initial orientation visit.  The plan for exercise progression was also introduced and progression will be customized based on patient's performance and goals. 30 day review completed. ITP sent to Dr.Jehanzeb Memon, Medical Director of  Pulmonary Rehab. Continue with ITP unless changes are made by physician.  New to program. 30 day review completed. ITP sent to Dr.Jehanzeb Memon, Medical Director of  Pulmonary Rehab. Continue with ITP unless changes are made by physician. 30 day review completed. ITP sent to Dr.Jehanzeb Memon, Medical Director of  Pulmonary Rehab. Continue with ITP unless changes are made by physician.    Row Name 12/02/23 0802 12/29/23 0730         ITP Comments 30 day review completed. ITP sent to Dr.Jehanzeb Memon, Medical Director of  Pulmonary Rehab. Continue with ITP unless changes are made by physician. 30 day review completed. ITP sent to Dr.Jehanzeb Memon, Medical Director of  Pulmonary Rehab. Continue with ITP unless changes are made by physician.  Pt has not attended since 12/14/23.  Has one visit left.               Comments: 30 day review

## 2024-01-01 ENCOUNTER — Encounter (HOSPITAL_COMMUNITY): Payer: Medicare Other

## 2024-01-01 ENCOUNTER — Other Ambulatory Visit: Payer: Self-pay

## 2024-01-01 ENCOUNTER — Encounter (HOSPITAL_COMMUNITY): Payer: Self-pay

## 2024-01-01 ENCOUNTER — Encounter: Payer: Self-pay | Admitting: Pulmonary Disease

## 2024-01-01 ENCOUNTER — Ambulatory Visit (HOSPITAL_COMMUNITY): Payer: Medicare Other | Attending: Orthopedic Surgery

## 2024-01-01 DIAGNOSIS — M7062 Trochanteric bursitis, left hip: Secondary | ICD-10-CM | POA: Insufficient documentation

## 2024-01-01 MED ORDER — MINOCYCLINE HCL 100 MG PO CAPS: ORAL_CAPSULE | ORAL | 0 refills | Status: DC

## 2024-01-01 NOTE — Addendum Note (Signed)
 Addended by: Starling Manns D on: 01/01/2024 11:48 AM   Modules accepted: Orders

## 2024-01-01 NOTE — Therapy (Signed)
 OUTPATIENT PHYSICAL THERAPY LOWER EXTREMITY EVALUATION   Patient Name: Clarence Dawson MRN: [id number] DOB:22-Feb-1946, 78 y.o., male Today's Date: 01/01/2024  END OF SESSION:  PT End of Session - 01/01/24 1133     Visit Number 1    Authorization Type [employer] Medicare    Authorization Time Period One time visit    Authorization - Visit Number 1    PT Start Time 1101    PT Stop Time 1133    PT Time Calculation (min) 32 min    Activity Tolerance Patient tolerated treatment well    Behavior During Therapy WFL for tasks assessed/performed             Past Medical History:  Diagnosis Date   Allergic rhinitis    Aortic valve disorder    Asthma    since childhood- seasonal allergies induced   Cancer (HCC)    Skin cancer- squamous, basal   Carotid artery stenosis    Essential hypertension    Full dentures    GERD (gastroesophageal reflux disease)    H/O hiatal hernia    Hemorrhage of rectum    Hyperlipidemia    Hypothyroidism    Male circumcision    OSA (obstructive sleep apnea)    Osteoarthritis    Pacemaker    Oct 2005 in [place].   PAF (paroxysmal atrial fibrillation) (HCC)    Pneumonia    several Times 2015 last time   Primary localized osteoarthritis of right knee 08/11/2017   RBBB (right bundle branch block)    Sinoatrial node dysfunction (HCC)    Syncope    Tricuspid valve disorder    Type 2 diabetes mellitus (HCC)    Type II   Past Surgical History:  Procedure Laterality Date   A-V CARDIAC PACEMAKER INSERTION     Sick sinus syndrome DDR pacer   Arthropathy Right 2005   Rebuilding of left thumb and joint    CARDIAC CATHETERIZATION     CARDIAC ELECTROPHYSIOLOGY STUDY AND ABLATION  09/2008   for pvcs, Dr. Andreas   CARDIOVERSION N/A 12/18/2016   Procedure: CARDIOVERSION;  Surgeon: Vinie JAYSON Maxcy, MD;  Location[facility] ENDOSCOPY;  Service: Cardiovascular;  Laterality: N/A;   CARDIOVERSION N/A 09/05/2021   Procedure: CARDIOVERSION;  Surgeon: Barbaraann Darryle Ned, MD;  Location: [facility] ENDOSCOPY;  Service: Cardiovascular;  Laterality: N/A;   CARDIOVERSION N/A 08/06/2022   Procedure: CARDIOVERSION;  Surgeon: Kate Lonni CROME, MD;  Location: [facility] ENDOSCOPY;  Service: Cardiovascular;  Laterality: N/A;   CARPAL TUNNEL RELEASE  1994   right wrist   CARPAL TUNNEL RELEASE  05/04/2012   Procedure: CARPAL TUNNEL RELEASE;  Surgeon: Arley JONELLE Curia, MD;  Location: [facility];  Service: Orthopedics;  Laterality: Left;   CARPOMETACARPEL SUSPENSION PLASTY Right 11/16/2014   Procedure: SUSPENSIONPLASTY RIGHT THUMB TENDON TRANSFER ABDUCTOR POLLICUS LONGUS EXCISION TRAPEZIUM;  Surgeon: Arley Curia, MD;  Location: [facility];  Service: Orthopedics;  Laterality: Right;   CHOLECYSTECTOMY  1994   CIRCUMCISION     COLONOSCOPY N/A 03/14/2013   Procedure: COLONOSCOPY;  Surgeon: Lamar CHRISTELLA Hollingshead, MD;  Location: [facility]O [facility]ITE;  Service: Endoscopy;  Laterality: N/A;  8:15 AM   EYE SURGERY     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-[facility]   EYE SURGERY  2012   Left eye Corneal transplant- partial- Cataract   FLEXIBLE BRONCHOSCOPY Right 06/23/2019   Procedure: FLEXIBLE BRONCHOSCOPY RIGHT;  Surgeon: Verdia Art, MD;  Location: [facility] ORS;  Service: Pulmonary;  Laterality: Right;  GALLBLADDER SURGERY  12/01/2006   HAND TENDON SURGERY Left late 1990's   thumb   HEMORROIDECTOMY  2003   INJECTION KNEE Left 08/26/2017   Procedure: LEFT KNEE INJECTION;  Surgeon: Jane Charleston, MD;  Location[facility] OR;  Service: Orthopedics;  Laterality: Left;   left Knee Arthroscopy     [facility]   PARTIAL KNEE ARTHROPLASTY  11/22/2012   Procedure: UNICOMPARTMENTAL KNEE;  Surgeon: Charleston DELENA Jane, MD;  Location: [facility] OR;  Service: Orthopedics;  Laterality: Left;  left unicompartmental knee arthroplasty   PERMANENT PACEMAKER GENERATOR CHANGE N/A 01/12/2013   Procedure: PERMANENT PACEMAKER GENERATOR CHANGE;  Surgeon: Danelle LELON Birmingham, MD;  Location: [facility]  CATH LAB;  Service: Cardiovascular;  Laterality: N/A;   PPM GENERATOR CHANGEOUT N/A 07/07/2022   Procedure: PPM GENERATOR CHANGEOUT;  Surgeon: Birmingham Danelle LELON, MD;  Location: [facility] INVASIVE CV LAB;  Service: Cardiovascular;  Laterality: N/A;   RIGHT/LEFT HEART CATH AND CORONARY ANGIOGRAPHY N/A 08/28/2023   Procedure: RIGHT/LEFT HEART CATH AND CORONARY ANGIOGRAPHY;  Surgeon: Rolan Ezra RAMAN, MD;  Location: [facility] INVASIVE CV LAB;  Service: Cardiovascular;  Laterality: N/A;   Rotator cuff Surgery  2001   Right shoulder   TONSILLECTOMY     TOTAL KNEE ARTHROPLASTY Right 08/26/2017   Procedure: TOTAL KNEE ARTHROPLASTY;  Surgeon: Jane Charleston, MD;  Location: [facility] OR;  Service: Orthopedics;  Laterality: Right;   varicose vein reduction     Patient Active Problem List   Diagnosis Date Noted   Chronic respiratory failure with hypoxia (HCC) 06/12/2023   Hypercoagulable state due to persistent atrial fibrillation (HCC) 01/02/2020   Chronic diastolic heart failure (HCC) 11/22/2019   Paroxysmal atrial fibrillation (HCC) 09/22/2019   Atrial fibrillation with RVR (HCC)    Lobar pneumonia (HCC) 09/21/2019   Acute respiratory failure with hypoxia (HCC) 09/21/2019   Acute on chronic respiratory failure (HCC) 09/18/2019   Severe sepsis (HCC)    Elevated lactic acid level    GERD (gastroesophageal reflux disease)    Chronic bronchitis (HCC) 02/09/2019   CAP (community acquired pneumonia) 03/03/2018   Primary localized osteoarthritis of right knee 08/11/2017   Asthma with acute exacerbation 12/08/2016   Chest pain 10/20/2016   HTN (hypertension) 10/20/2016   Morbid obesity due to excess calories (HCC) 06/24/2016   Severe persistent asthma 02/12/2016   Upper airway cough syndrome 01/24/2016   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   Persistent atrial fibrillation    Syncope    Hyperthyroidism    Hyperlipidemia    RBBB (right bundle branch block)    Tricuspid valve disorder    Aortic valve disorder    Carotid artery  stenosis    Type 2 diabetes mellitus (HCC)    Pacemaker    OSA (obstructive sleep apnea)    Arthritis    Sinoatrial node dysfunction (HCC)    PVC's (premature ventricular contractions) 07/17/2011   Essential hypertension, benign 01/24/2011   Premature ventricular contractions 01/24/2011   PPM-[facility] 01/24/2011    PCP: Sheryle Carwin MD  REFERRING PROVIDER: Fonda Olmsted MD  REFERRING DIAG: Left greater trochanteric bursitis  THERAPY DIAG:  Greater trochanteric bursitis of left hip  Rationale for Evaluation and Treatment: Rehabilitation  ONSET DATE: 2-3 weeks ago  SUBJECTIVE:   SUBJECTIVE STATEMENT: Pt reporting start of Left hip pain. Pt went to provider and they discussed potentially could be bursitis in left hip. Pt reports he was released from cardiac rehab. Pt has rested the past few weeks with warming and cold  at home and has found that his symptoms seemed to have resolved.   No symptoms as of now. Pt has a lot of other various medical issues going on and wants to limit number of appointments he has to attend.   PERTINENT HISTORY: Pulmonary Arterial Hypertension PAIN:  Are you having pain? Yes: NPRS scale: 0/10 Pain location: Left hip Pain description: constant and mildly sharp Aggravating factors: activity, standing, walking, sitting Relieving factors: heat/cold, pressure.   PRECAUTIONS: None  RED FLAGS: None   WEIGHT BEARING RESTRICTIONS: No  FALLS:  Has patient fallen in last 6 months? No  PATIENT GOALS: Get handout at hand activities at home.   OBJECTIVE:  Note: Objective measures were completed at Evaluation unless otherwise noted.  DIAGNOSTIC FINDINGS:   PATIENT SURVEYS:   COGNITION: Overall cognitive status: Within functional limits for tasks assessed     SENSATION: WFL   POSTURE: decreased lumbar lordosis  PALPATION: Mildly tender to palpate along left trochanter  LOWER EXTREMITY ROM:  Active ROM Right eval Left eval  Hip flexion     Hip extension    Hip abduction    Hip adduction    Hip internal rotation 25 20  Hip external rotation 35 30  Knee flexion    Knee extension    Ankle dorsiflexion    Ankle plantarflexion    Ankle inversion    Ankle eversion     (Blank rows = not tested)  LOWER EXTREMITY MMT:  MMT Right eval Left eval  Hip flexion 4- 4-  Hip extension 4- 3+  Hip abduction 3+ 3+  Hip adduction    Hip internal rotation    Hip external rotation    Knee flexion    Knee extension    Ankle dorsiflexion    Ankle plantarflexion    Ankle inversion    Ankle eversion     (Blank rows = not tested)   GAIT: Distance walked: 4ft Assistive device utilized:  Portable Oxygen  Level of assistance: Complete Independence Comments: Bilateral trunk sway with landing and stance phase bilaterally.                                                                                                                                 TREATMENT DATE: 01/01/2024 PT Evaluation and HEP below with overview and completed initial ste.    PATIENT EDUCATION:  Education details: PT Evaluation, findings, prognosis, frequency, attendance policy, and HEP if given.  Person educated: Patient Education method: Medical Illustrator Education comprehension: verbalized understanding  HOME EXERCISE PROGRAM: Access Code: [email or web address] URL: [email or web address] Date: 01/01/2024 Prepared by: Omega Bottcher  Exercises - Supine Bridge  - 1 x daily - 7 x weekly - 3 sets - 10 reps - Sit to Stand with Arms Crossed  - 1 x daily - 7 x weekly - 3 sets - 10 reps - Side Stepping with Resistance at Ankles and Counter Support  - 1 x daily -  7 x weekly - 3 sets - 10 reps - Standing Tandem Balance with Counter Support  - 1 x daily - 7 x weekly - 3 sets - 10 reps - 30-60 hold - Standing Hip Abduction  - 1 x daily - 7 x weekly - 3 sets - 10 reps - 5 hold - Standing Heel Raises  - 1 x daily - 7 x weekly - 3 sets - 10 reps -  Standing Knee Flexion AROM with Chair Support  - 1 x daily - 7 x weekly - 3 sets - 10 reps  ASSESSMENT:  CLINICAL IMPRESSION: Patient is a 78 y.o. male who was seen today for physical therapy evaluation and treatment for left greatr trochanteric bursitis. Pt with Pulmonary Hypertension, reporting that he was in cardiac rehab for three months and at final session had to stop due to onset of left lateral hip pain. Pt went to MD, thinking it was Left greater trochanter bursitis. Pt reporting that he has such a busy schedule and poor breathing, that an extended PT POC is not within his desires. Upon evaluation today,  Pt reporting the he has not had pain the last two weeks and seems to have resolved.  Pt demonstrating reduced hip ROM, reduced hip MMT levels and reduced activity tolerance in part to impairments noted above and other medical complexities.Today is one time evaluation with HEP setup for general strengthening and ROM activity to reduce impairments. Pt is agreement with plan. Pt will not be kept on for further PT services at this time and discharge with home program. .   OBJECTIVE IMPAIRMENTS: Abnormal gait, decreased activity tolerance, decreased balance, difficulty walking, decreased ROM, decreased strength, and pain.    CLINICAL DECISION MAKING: Stable/uncomplicated  EVALUATION COMPLEXITY: Low  GOALS: Goals reviewed with patient? Yes  SHORT TERM GOALS: Target date: 01/01/2024  Pt will be independent with HEP in order to demonstrate participation in Physical Therapy POC.  Baseline: Goal status: INITIAL    PLAN:  PT FREQUENCY: one time visit  PT DURATION: other: one time visit  PLANNED INTERVENTIONS: 97164- PT Re-evaluation, 97110-Therapeutic exercises, 97530- Therapeutic activity, [id number]- Neuromuscular re-education, 97535- Self Care, [id number]- Manual therapy, [id number]- Gait training, 97014- Electrical stimulation (unattended), [id number]- Electrical stimulation (manual), and [id number]- Traction  (mechanical)  PLAN FOR NEXT SESSION: One time visit  Omega JONETTA Bottcher PT, DPT [facility] [place] [phone] office   Omega JONETTA Bottcher, PT 01/01/2024, 11:35 AM

## 2024-01-01 NOTE — Telephone Encounter (Signed)
 Called patient.  He has had minor hemoptysis with associated purulent sputum production since the 28th of December.  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  Recent culture results confirms stenotrophomonas.  However, needed sensitivities which are now available.  The organism is sensitive to Levaquin  and Bactrim  however, the patient is on Tikosyn  and these 2 medications are contraindicated with Tikosyn .  Explained to the patient that we will have to use second line therapy which is minocycline , this should be well-tolerated and not have interactions with the Tikosyn .  He was advised to be careful with sun exposure on the minocycline .  If hemoptysis worsens he is to come to the emergency room for evaluation.  Minocycline  prescription was sent to [facility] which is the patient's pharmacy.

## 2024-01-05 ENCOUNTER — Other Ambulatory Visit (HOSPITAL_COMMUNITY): Payer: Self-pay | Admitting: Pharmacy Technician

## 2024-01-05 ENCOUNTER — Other Ambulatory Visit (HOSPITAL_COMMUNITY): Payer: Self-pay

## 2024-01-05 NOTE — Progress Notes (Signed)
 Specialty Pharmacy Refill Coordination Note  Clarence Dawson is a 78 y.o. male contacted today regarding refills of specialty medication(s) Dupilumab  (Dupixent )   Patient requested Marylyn at [facility] at [place] date: 01/08/24   Medication will be filled on 01/07/24.

## 2024-01-11 ENCOUNTER — Other Ambulatory Visit (HOSPITAL_COMMUNITY): Payer: Self-pay

## 2024-01-15 ENCOUNTER — Telehealth (HOSPITAL_COMMUNITY): Payer: Self-pay | Admitting: *Deleted

## 2024-01-15 ENCOUNTER — Encounter (HOSPITAL_COMMUNITY): Payer: Self-pay | Admitting: *Deleted

## 2024-01-15 DIAGNOSIS — I272 Pulmonary hypertension, unspecified: Secondary | ICD-10-CM

## 2024-01-15 NOTE — Progress Notes (Signed)
Pulmonary Individual Treatment Plan  Patient Details  Name: Clarence Dawson MRN: [id number] Date of Birth: 02-04-1946 Referring Provider:   Flowsheet Row PULMONARY REHAB OTHER RESP ORIENTATION from 09/03/2023 in [facility] CARDIAC [facility]ITATION  Referring Provider Marca Ancona MD       Initial Encounter Date:  Flowsheet Row PULMONARY REHAB OTHER RESP ORIENTATION from 09/03/2023 in [place] CARDIAC REHABILITATION  Date 09/03/23       Visit Diagnosis: Pulmonary hypertension (HCC)  Patient's Home Medications on Admission:   Current Outpatient Medications:    acetaminophen (TYLENOL) 500 MG tablet, Take 1,000 mg by mouth 3 (three) times daily., Disp: , Rfl:    albuterol (PROVENTIL) (2.5 MG/3ML) 0.083% nebulizer solution, Take 3 mLs (2.5 mg total) by nebulization every 6 (six) hours as needed for wheezing or shortness of breath., Disp: 90 mL, Rfl: 6   albuterol (VENTOLIN HFA) 108 (90 Base) MCG/ACT inhaler, Inhale 2 puffs into the lungs every 4 (four) hours as needed for shortness of breath (only if you can't catch your breath/ asthma)., Disp: 18 g, Rfl: 5   atorvastatin (LIPITOR) 40 MG tablet, Take 1 tablet (40 mg total) by mouth at bedtime., Disp: 90 tablet, Rfl: 3   bisoprolol (ZEBETA) 5 MG tablet, TAKE 1 AND 1/2 TABLET BY MOUTH TWICE DAILY., Disp: 90 tablet, Rfl: 3   BREZTRI AEROSPHERE 160-9-4.8 MCG/ACT AERO, INHALE 2 PUFFS INTO THE LUNGS TWICE DAILY., Disp: 10.7 g, Rfl: 5   Carboxymethylcellul-Glycerin (LUBRICATING EYE DROPS OP), Place 1 drop into the right eye 2 (two) times a week. Clear eyes, Disp: , Rfl:    diltiazem (CARDIZEM CD) 120 MG 24 hr capsule, TAKE (1) CAPSULE BY MOUTH DAILY, MAY TAKE A EXTRA CAPSULE DAILY FOR BREAKTHROUGH AFIB., Disp: 180 capsule, Rfl: 3   dofetilide (TIKOSYN) 250 MCG capsule, Take 1 capsule (250 mcg total) by mouth 2 (two) times daily., Disp: 60 capsule, Rfl: 3   Dupilumab (DUPIXENT) 300 MG/2ML SOAJ, Inject 300 mg into the skin every 14 (fourteen) days.,  Disp: 12 mL, Rfl: 1   Ensifentrine (OHTUVAYRE) 3 MG/2.5ML SUSP, Take 3 mg by nebulization 2 (two) times daily., Disp: , Rfl:    EPINEPHrine 0.3 mg/0.3 mL IJ SOAJ injection, Inject 0.3 mg into the muscle as needed for anaphylaxis., Disp: 1 each, Rfl: 5   fluticasone (FLONASE) 50 MCG/ACT nasal spray, Place 2 sprays into both nostrils daily., Disp: 16 g, Rfl: 6   furosemide (LASIX) 40 MG tablet, Take 1 tablet (40 mg total) by mouth 2 (two) times daily., Disp: 60 tablet, Rfl: 3   glipiZIDE (GLUCOTROL XL) 5 MG 24 hr tablet, Take 5 mg by mouth daily before breakfast., Disp: , Rfl:    hydrocortisone cream 0.5 %, Apply 1 Application topically daily as needed for itching., Disp: , Rfl:    JARDIANCE 25 MG TABS tablet, Take 25 mg by mouth daily before breakfast., Disp: , Rfl:    levothyroxine (SYNTHROID) 150 MCG tablet, Take 150 mcg by mouth daily before breakfast., Disp: , Rfl:    losartan (COZAAR) 50 MG tablet, Take 50 mg by mouth at bedtime., Disp: , Rfl:    metFORMIN (GLUCOPHAGE-XR) 500 MG 24 hr tablet, Take 500 mg by mouth in the morning and at bedtime., Disp: , Rfl:    minocycline (MINOCIN) 100 MG capsule, Take two capsules at first dose then 1 capsule twice a day until all gone., Disp: 22 capsule, Rfl: 0   Multiple Vitamin (MULTIVITAMIN) tablet, Take 1 tablet by mouth daily., Disp: ,  Rfl:    omeprazole (PRILOSEC OTC) 20 MG tablet, Take 20 mg by mouth every morning., Disp: , Rfl:    PRESCRIPTION MEDICATION, Thumper vest, Disp: , Rfl:    Respiratory Therapy Supplies (FLUTTER) DEVI, Use as directed, Disp: 1 each, Rfl: 0   rivaroxaban (XARELTO) 20 MG TABS tablet, Take 20 mg by mouth at bedtime., Disp: , Rfl:    sodium chloride (OCEAN) 0.65 % SOLN nasal spray, Place 1 spray into both nostrils as needed for congestion., Disp: , Rfl:   Past Medical History: Past Medical History:  Diagnosis Date   Allergic rhinitis    Aortic valve disorder    Asthma    since childhood- seasonal allergies induced    Cancer (HCC)    Skin cancer- squamous, basal   Carotid artery stenosis    Essential hypertension    Full dentures    GERD (gastroesophageal reflux disease)    H/O hiatal hernia    Hemorrhage of rectum    Hyperlipidemia    Hypothyroidism    Male circumcision    OSA (obstructive sleep apnea)    Osteoarthritis    Pacemaker    Oct 2005 in [place].   PAF (paroxysmal atrial fibrillation) (HCC)    Pneumonia    "several Times" 2015 last time   Primary localized osteoarthritis of right knee 08/11/2017   RBBB (right bundle branch block)    Sinoatrial node dysfunction (HCC)    Syncope    Tricuspid valve disorder    Type 2 diabetes mellitus (HCC)    Type II    Tobacco Use: Social History   Tobacco Use  Smoking Status Former   Current packs/day: 0.00   Average packs/day: 3.0 packs/day for 26.0 years (77.9 ttl pk-yrs)   Types: Cigarettes   Start date: 02/20/1958   Quit date: 02/12/1984   Years since quitting: 39.9  Smokeless Tobacco Former   Types: Chew  Tobacco Comments   Former smoker 08/30/2021    Labs: Review Flowsheet  More data exists      Latest Ref Rng & Units 10/20/2016 08/13/2017 09/18/2019 08/28/2023 09/01/2023  Labs for ITP Cardiac and Pulmonary Rehab  Cholestrol 0 - 200 mg/dL - - - - 409   LDL (calc) 0 - 99 mg/dL - - - - 73   HDL-C >81 mg/dL - - - - 34   Trlycerides <150 mg/dL - - - - 191   Hemoglobin A1c 4.8 - 5.6 % 8.1  7.2  7.2  - -  PH, Arterial 7.35 - 7.45 - - - 7.438  -  PCO2 arterial 32 - 48 mmHg - - - 32.7  -  Bicarbonate 20.0 - 28.0 mmol/L - - - 22.1  25.0  24.7  -  TCO2 22 - 32 mmol/L - - - 23  26  26   -  Acid-base deficit 0.0 - 2.0 mmol/L - - - 1.0  -  O2 Saturation % - - - 95  65  64  -    Details       Multiple values from one day are sorted in reverse-chronological order         Capillary Blood Glucose: Lab Results  Component Value Date   GLUCAP 139 (H) 09/09/2023   GLUCAP 207 (H) 09/09/2023   GLUCAP 106 (H) 09/07/2023   GLUCAP 119 (H)  09/07/2023   GLUCAP 130 (H) 09/04/2023     Pulmonary Assessment Scores:  Pulmonary Assessment Scores     Row Name 09/04/23 [id number]  ADL UCSD   ADL Phase Entry     SOB Score total 47     Rest 1     Walk 3     Stairs 3     Bath 1     Dress 2     Shop 2       CAT Score   CAT Score 16       mMRC Score   mMRC Score 1             UCSD: Self-administered rating of dyspnea associated with activities of daily living (ADLs) 6-point scale (0 = "not at all" to 5 = "maximal or unable to do because of breathlessness")  Scoring Scores range from 0 to 120.  Minimally important difference is 5 units  CAT: CAT can identify the health impairment of COPD patients and is better correlated with disease progression.  CAT has a scoring range of zero to 40. The CAT score is classified into four groups of low (less than 10), medium (10 - 20), high (21-30) and very high (31-40) based on the impact level of disease on health status. A CAT score over 10 suggests significant symptoms.  A worsening CAT score could be explained by an exacerbation, poor medication adherence, poor inhaler technique, or progression of COPD or comorbid conditions.  CAT MCID is 2 points  mMRC: mMRC (Modified Medical Research Council) Dyspnea Scale is used to assess the degree of baseline functional disability in patients of respiratory disease due to dyspnea. No minimal important difference is established. A decrease in score of 1 point or greater is considered a positive change.   Pulmonary Function Assessment:   Exercise Target Goals: Exercise Program Goal: Individual exercise prescription set using results from initial 6 min walk test and THRR while considering  patient's activity barriers and safety.   Exercise Prescription Goal: Initial exercise prescription builds to 30-45 minutes a day of aerobic activity, 2-3 days per week.  Home exercise guidelines will be given to patient during program as part of  exercise prescription that the participant will acknowledge.  Activity Barriers & Risk Stratification:  Activity Barriers & Cardiac Risk Stratification - 09/03/23 0844       Activity Barriers & Cardiac Risk Stratification   Activity Barriers Deconditioning;Muscular Weakness;Shortness of Breath;Other (comment);Arthritis;Left Knee Replacement;Right Knee Replacement;Joint Problems;Balance Concerns;History of Falls;Decreased Ventricular Function    Comments grip effected by arthritis, bilateral knee arth, and shoulders             6 Minute Walk:  6 Minute Walk     Row Name 09/03/23 1012         6 Minute Walk   Phase Initial     Distance 1120 feet     Walk Time 5.17 minutes     # of Rest Breaks 1  50 sec     MPH 2.46     METS 1.69     RPE 14     Perceived Dyspnea  2     VO2 Peak 5.91     Symptoms Yes (comment)     Comments SOB, calves painful 8/10     Resting HR 60 bpm     Resting BP 124/72     Resting Oxygen Saturation  92 %     Exercise Oxygen Saturation  during 6 min walk 87 %     Max Ex. HR 64 bpm     Max Ex. BP 146/64     2 Minute Post BP  132/70       Interval HR   1 Minute HR 60     2 Minute HR 61     3 Minute HR 62     4 Minute HR 64     5 Minute HR 60     6 Minute HR 61     2 Minute Post HR 60     Interval Heart Rate? Yes       Interval Oxygen   Interval Oxygen? Yes     Baseline Oxygen Saturation % 92 %     1 Minute Oxygen Saturation % 87 %     1 Minute Liters of Oxygen 4 L  pulsed     2 Minute Oxygen Saturation % 87 %     2 Minute Liters of Oxygen 4 L     3 Minute Oxygen Saturation % 88 %     3 Minute Liters of Oxygen 4 L     4 Minute Oxygen Saturation % 89 %     4 Minute Liters of Oxygen 4 L     5 Minute Oxygen Saturation % 87 %     5 Minute Liters of Oxygen 4 L     6 Minute Oxygen Saturation % 88 %     6 Minute Liters of Oxygen 4 L     2 Minute Post Oxygen Saturation % 90 %     2 Minute Post Liters of Oxygen 4 L              Oxygen  Initial Assessment:  Oxygen Initial Assessment - 09/03/23 0849       Home Oxygen   Home Oxygen Device Portable Concentrator;Home Concentrator    Sleep Oxygen Prescription BiPAP;Continuous    Liters per minute 4    Home Exercise Oxygen Prescription Continuous    Liters per minute 4    Home Resting Oxygen Prescription Continuous    Liters per minute 3    Compliance with Home Oxygen Use Yes      Initial 6 min Walk   Oxygen Used Pulsed;Portable Concentrator    Liters per minute 4      Program Oxygen Prescription   Program Oxygen Prescription Continuous;E-Tanks    Liters per minute 3      Intervention   Short Term Goals To learn and exhibit compliance with exercise, home and travel O2 prescription;To learn and understand importance of monitoring SPO2 with pulse oximeter and demonstrate accurate use of the pulse oximeter.;To learn and understand importance of maintaining oxygen saturations>88%;To learn and demonstrate proper pursed lip breathing techniques or other breathing techniques. ;To learn and demonstrate proper use of respiratory medications    Long  Term Goals Exhibits compliance with exercise, home  and travel O2 prescription;Maintenance of O2 saturations>88%;Compliance with respiratory medication;Verbalizes importance of monitoring SPO2 with pulse oximeter and return demonstration;Exhibits proper breathing techniques, such as pursed lip breathing or other method taught during program session;Demonstrates proper use of MDI's             Oxygen Re-Evaluation:  Oxygen Re-Evaluation     Row Name 09/03/23 1049 09/16/23 0838 10/21/23 0810 11/06/23 0834       Program Oxygen Prescription   Program Oxygen Prescription -- Continuous;E-Tanks Continuous;E-Tanks Continuous;E-Tanks    Liters per minute -- 6 6 6     Comments -- [name] is on 6 liters when exercising -- --      Home Oxygen   Home Oxygen Device -- Portable Concentrator;Home Concentrator  Portable Concentrator;Home  Concentrator Portable Concentrator;Home Concentrator    Sleep Oxygen Prescription -- BiPAP;Continuous BiPAP;Continuous BiPAP;Continuous    Liters per minute -- 4 4 4     Home Exercise Oxygen Prescription -- Continuous Continuous Continuous    Liters per minute -- 4 4 4     Home Resting Oxygen Prescription -- Continuous Continuous Continuous    Liters per minute -- 3 4 4     Compliance with Home Oxygen Use -- Yes Yes Yes      Goals/Expected Outcomes   Short Term Goals To learn and demonstrate proper pursed lip breathing techniques or other breathing techniques.  To learn and demonstrate proper pursed lip breathing techniques or other breathing techniques.  To learn and exhibit compliance with exercise, home and travel O2 prescription;To learn and understand importance of monitoring SPO2 with pulse oximeter and demonstrate accurate use of the pulse oximeter.;To learn and understand importance of maintaining oxygen saturations>88%;To learn and demonstrate proper pursed lip breathing techniques or other breathing techniques.  To learn and exhibit compliance with exercise, home and travel O2 prescription;To learn and understand importance of monitoring SPO2 with pulse oximeter and demonstrate accurate use of the pulse oximeter.;To learn and understand importance of maintaining oxygen saturations>88%;To learn and demonstrate proper pursed lip breathing techniques or other breathing techniques.     Long  Term Goals Exhibits proper breathing techniques, such as pursed lip breathing or other method taught during program session Exhibits proper breathing techniques, such as pursed lip breathing or other method taught during program session Exhibits compliance with exercise, home  and travel O2 prescription;Verbalizes importance of monitoring SPO2 with pulse oximeter and return demonstration;Maintenance of O2 saturations>88%;Exhibits proper breathing techniques, such as pursed lip breathing or other method taught  during program session;Compliance with respiratory medication Exhibits compliance with exercise, home  and travel O2 prescription;Verbalizes importance of monitoring SPO2 with pulse oximeter and return demonstration;Maintenance of O2 saturations>88%;Exhibits proper breathing techniques, such as pursed lip breathing or other method taught during program session;Compliance with respiratory medication    Comments Reviewed PLB technique with pt.  Talked about how it works and it's importance in maintaining their exercise saturations. [name] has noticed an slight increase in hois breathing. He is able to go around the grocery store without giving out and being SOB. He uses PLB when he feels SOB and it has helped him bring his oxygen levels up. He checks his levels during the day and stated that they stay WNL. [name] is doing well in rehab.  He was out last week with a sinus infection and is having a hard time with his breathing from his sinuses.  They are starting to feel better but now struggling with side effects from antibiotic.  He is doing well with his PLB and pulm meds.  He is compliant with oxygen and is now on 6L since infection continuously.  He hopes to be able to drop back to 4L for rest once recovered.  He had a breathing test yesterday with various test done.  He felt like he had worked out for an hour. [name] is doing well in rehab.  He is compliant with his oxygen and BiPap at night.  He says he doesn't sleep without it.  He is using PLB and doing well on meds.  He is supposed to start a new nebulizer over weekend and will bring [place] the name of it next week.  He is a little worried about the side effects of nebulizer.    Goals/Expected Outcomes  Short: Become more profiecient at using PLB.   Long: Become independent at using PLB. Short: continue to monitor oxygen levels   long term: continue to be compliant with oxygen Short: Continue to work on breathing and recovery Long: conitnued compliance Short; Try out  new nebulizer Long: conitnue to be compliant             Oxygen Discharge (Final Oxygen Re-Evaluation):  Oxygen Re-Evaluation - 11/06/23 0834       Program Oxygen Prescription   Program Oxygen Prescription Continuous;E-Tanks    Liters per minute 6      Home Oxygen   Home Oxygen Device Portable Concentrator;Home Concentrator    Sleep Oxygen Prescription BiPAP;Continuous    Liters per minute 4    Home Exercise Oxygen Prescription Continuous    Liters per minute 4    Home Resting Oxygen Prescription Continuous    Liters per minute 4    Compliance with Home Oxygen Use Yes      Goals/Expected Outcomes   Short Term Goals To learn and exhibit compliance with exercise, home and travel O2 prescription;To learn and understand importance of monitoring SPO2 with pulse oximeter and demonstrate accurate use of the pulse oximeter.;To learn and understand importance of maintaining oxygen saturations>88%;To learn and demonstrate proper pursed lip breathing techniques or other breathing techniques.     Long  Term Goals Exhibits compliance with exercise, home  and travel O2 prescription;Verbalizes importance of monitoring SPO2 with pulse oximeter and return demonstration;Maintenance of O2 saturations>88%;Exhibits proper breathing techniques, such as pursed lip breathing or other method taught during program session;Compliance with respiratory medication    Comments [name] is doing well in rehab.  He is compliant with his oxygen and BiPap at night.  He says he doesn't sleep without it.  He is using PLB and doing well on meds.  He is supposed to start a new nebulizer over weekend and will bring [place] the name of it next week.  He is a little worried about the side effects of nebulizer.    Goals/Expected Outcomes Short; Try out new nebulizer Long: conitnue to be compliant             Initial Exercise Prescription:  Initial Exercise Prescription - 09/03/23 1000       Date of Initial Exercise RX and  Referring Provider   Date 09/03/23    Referring Provider Marca Ancona MD      Oxygen   Oxygen Continuous    Liters 4    Maintain Oxygen Saturation 88% or higher      Treadmill   MPH 2.2    Grade 0.5    Minutes 15    METs 2.84      REL-XR   Level 3    Speed 50    Minutes 15    METs 2.3      Prescription Details   Frequency (times per week) 3    Duration Progress to 30 minutes of continuous aerobic without signs/symptoms of physical distress      Intensity   THRR 40-80% of Max Heartrate 93-126    Ratings of Perceived Exertion 11-13    Perceived Dyspnea 0-4      Progression   Progression Continue to progress workloads to maintain intensity without signs/symptoms of physical distress.      Resistance Training   Training Prescription Yes    Weight 5 lb    Reps 10-15  Perform Capillary Blood Glucose checks as needed.  Exercise Prescription Changes:   Exercise Prescription Changes     Row Name 09/03/23 1000 09/21/23 1300 10/07/23 1300 10/21/23 1200 11/02/23 1200     Response to Exercise   Blood Pressure (Admit) 124/72 132/74 136/60 112/60 134/60   Blood Pressure (Exercise) 146/64 108/58 -- -- --   Blood Pressure (Exit) 120/62 128/58 126/74 114/62 120/60   Heart Rate (Admit) 60 bpm 60 bpm 1 bpm 60 bpm 60 bpm   Heart Rate (Exercise) 64 bpm 60 bpm 61 bpm 60 bpm 60 bpm   Heart Rate (Exit) 60 bpm 60 bpm 61 bpm 60 bpm 60 bpm   Oxygen Saturation (Admit) 92 % 90 % 95 % 94 % 94 %   Oxygen Saturation (Exercise) 87 % 88 % 89 % 91 % 90 %   Oxygen Saturation (Exit) 90 % 97 % 90 % 94 % 93 %   Rating of Perceived Exertion (Exercise) 14 13 12 13 12    Perceived Dyspnea (Exercise) 2 2 1 2 1    Symptoms SOB, calves pain 8/10 -- -- -- --   Comments walk test results -- -- -- --   Duration -- Continue with 30 min of aerobic exercise without signs/symptoms of physical distress. Continue with 30 min of aerobic exercise without signs/symptoms of physical distress.  Continue with 30 min of aerobic exercise without signs/symptoms of physical distress. Continue with 30 min of aerobic exercise without signs/symptoms of physical distress.   Intensity -- THRR unchanged THRR unchanged THRR unchanged THRR unchanged     Progression   Progression -- Continue to progress workloads to maintain intensity without signs/symptoms of physical distress. Continue to progress workloads to maintain intensity without signs/symptoms of physical distress. Continue to progress workloads to maintain intensity without signs/symptoms of physical distress. Continue to progress workloads to maintain intensity without signs/symptoms of physical distress.     Resistance Training   Training Prescription -- Yes Yes Yes Yes   Weight -- 5 lbs 5 lbs 5 lbs 5 lbs   Reps -- 10-15 10-15 10-15 10-15   Time -- 1 Minutes -- -- --     Oxygen   Oxygen -- Continuous Continuous Continuous Continuous   Liters -- 4 6 6 6      Treadmill   MPH -- 2 2 2 2    Grade -- 0.5 0 0 0   Minutes -- 15 15 15 15    METs -- 2.67 2.53 2.53 2.53     REL-XR   Level -- 3 2 2 2    Speed -- 51 53 54 55   Minutes -- 15 15 15 15    METs -- 2.5 2.4 2.6 2.4     Oxygen   Maintain Oxygen Saturation -- 88% or higher 88% or higher 88% or higher 88% or higher    Row Name 11/16/23 1500 12/01/23 0700 12/14/23 1300         Response to Exercise   Blood Pressure (Admit) 132/64 140/60 110/60     Blood Pressure (Exit) 98/64 136/64 110/62     Heart Rate (Admit) 60 bpm 60 bpm 60 bpm     Heart Rate (Exercise) 65 bpm 63 bpm 69 bpm     Heart Rate (Exit) 61 bpm 60 bpm 60 bpm     Oxygen Saturation (Admit) 95 % 93 % 96 %     Oxygen Saturation (Exercise) 90 % 91 % 90 %     Oxygen Saturation (Exit) 96 %  92 % 96 %     Rating of Perceived Exertion (Exercise) 12 13 12      Perceived Dyspnea (Exercise) 1 2 1      Duration Continue with 30 min of aerobic exercise without signs/symptoms of physical distress. Continue with 30 min of aerobic  exercise without signs/symptoms of physical distress. Continue with 30 min of aerobic exercise without signs/symptoms of physical distress.     Intensity THRR unchanged THRR unchanged THRR unchanged       Progression   Progression Continue to progress workloads to maintain intensity without signs/symptoms of physical distress. Continue to progress workloads to maintain intensity without signs/symptoms of physical distress. Continue to progress workloads to maintain intensity without signs/symptoms of physical distress.       Resistance Training   Training Prescription Yes Yes Yes     Weight 5 lbs 5lbs 5 lbs     Reps 10-15 10-15 10-15       Oxygen   Oxygen Continuous Continuous Continuous     Liters 6 6 6        Treadmill   MPH 2 2 2      Grade 0 0 0     Minutes 15 15 15      METs 2.53 2.53 2.53       REL-XR   Level 2 2 2      Speed 56 54 54     Minutes 15 15 15      METs 2.7 2.5 2.7       Oxygen   Maintain Oxygen Saturation 88% or higher 88% or higher 88% or higher              Exercise Comments:   Exercise Goals and Review:   Exercise Goals     Row Name 09/03/23 1031             Exercise Goals   Increase Physical Activity Yes       Intervention Provide advice, education, support and counseling about physical activity/exercise needs.;Develop an individualized exercise prescription for aerobic and resistive training based on initial evaluation findings, risk stratification, comorbidities and participant's personal goals.       Expected Outcomes Short Term: Attend rehab on a regular basis to increase amount of physical activity.;Long Term: Add in home exercise to make exercise part of routine and to increase amount of physical activity.;Long Term: Exercising regularly at least 3-5 days a week.       Increase Strength and Stamina Yes       Intervention Provide advice, education, support and counseling about physical activity/exercise needs.;Develop an individualized  exercise prescription for aerobic and resistive training based on initial evaluation findings, risk stratification, comorbidities and participant's personal goals.       Expected Outcomes Short Term: Increase workloads from initial exercise prescription for resistance, speed, and METs.;Short Term: Perform resistance training exercises routinely during rehab and add in resistance training at home;Long Term: Improve cardiorespiratory fitness, muscular endurance and strength as measured by increased METs and functional capacity ( )       Able to understand and use rate of perceived exertion (RPE) scale Yes       Intervention Provide education and explanation on how to use RPE scale       Expected Outcomes Short Term: Able to use RPE daily in rehab to express subjective intensity level;Long Term:  Able to use RPE to guide intensity level when exercising independently       Able to understand and use Dyspnea scale  Yes       Intervention Provide education and explanation on how to use Dyspnea scale       Expected Outcomes Short Term: Able to use Dyspnea scale daily in rehab to express subjective sense of shortness of breath during exertion;Long Term: Able to use Dyspnea scale to guide intensity level when exercising independently       Knowledge and understanding of Target Heart Rate Range (THRR) Yes       Intervention Provide education and explanation of THRR including how the numbers were predicted and where they are located for reference       Expected Outcomes Short Term: Able to state/look up THRR;Long Term: Able to use THRR to govern intensity when exercising independently;Short Term: Able to use daily as guideline for intensity in rehab       Able to check pulse independently Yes       Intervention Provide education and demonstration on how to check pulse in carotid and radial arteries.;Review the importance of being able to check your own pulse for safety during independent exercise       Expected  Outcomes Short Term: Able to explain why pulse checking is important during independent exercise;Long Term: Able to check pulse independently and accurately       Understanding of Exercise Prescription Yes       Intervention Provide education, explanation, and written materials on patient's individual exercise prescription       Expected Outcomes Long Term: Able to explain home exercise prescription to exercise independently;Short Term: Able to explain program exercise prescription                Exercise Goals Re-Evaluation :  Exercise Goals Re-Evaluation     Row Name 09/03/23 1032 09/16/23 0805 09/18/23 0759 09/21/23 1336 10/07/23 1316     Exercise Goal Re-Evaluation   Exercise Goals Review Able to understand and use rate of perceived exertion (RPE) scale;Able to understand and use Dyspnea scale;Understanding of Exercise Prescription Increase Physical Activity;Increase Strength and Stamina;Understanding of Exercise Prescription Increase Physical Activity;Increase Strength and Stamina;Able to understand and use rate of perceived exertion (RPE) scale;Able to understand and use Dyspnea scale;Knowledge and understanding of Target Heart Rate Range (THRR);Able to check pulse independently;Understanding of Exercise Prescription Increase Physical Activity;Increase Strength and Stamina;Understanding of Exercise Prescription Increase Physical Activity;Increase Strength and Stamina;Understanding of Exercise Prescription   Comments Reviewed RPE  and dyspnea scale and program prescription with pt today.  Pt voiced understanding and was given a copy of goals to take home. [name] has been doing good with rehab. [name] has noticed as slight improvment since starting the program. He was able to go around the grocery store and back to his car without giving out. He does have to stop for short breaks when on the treadmill due to RLE pain. HE also likes coming to class for social. [name] is off to a good start in rehab.   He has already started to notice an improvement in his stamina and feeling more clear headed.  Reviewed home exercise with pt today.  Pt plans to walk and use weights at home for exercise.  Reviewed THR, pulse, RPE, sign and symptoms, pulse oximetery and when to call 911 or MD.  Also discussed weather considerations and indoor options.  Pt voiced understanding. [name] has been tolerating exercise well. He has to take breaks on the treadmill due to calf pain. He is walking at a speed/grade of 2.0/0.5. He has increased his level on  the XR to level 3. Will continue to monitor and progress as able. [name] has been tolerating exercise well. He continues to take breaks on the treadmill due to calf pain. He increased his oxygen level to 6 liters today to improve breathing sue to having a bad day. Will continue to monitor and progress asa able.   Expected Outcomes Short: Use RPE daily to regulate intensity.  Long: Follow program prescription Short: go over home exericse   long: continue to attend pulmponary rehab. Short: Start to add in more walking at home Long: Conitnue to improve stamina Short term: continue to exercise at current workloads until he builds resisitance   long term: continue to attend rehab Short term: continue to exercise at current workloads until he builds resisitance   long term: continue to attend rehab    Row Name 10/21/23 0816 11/03/23 0741 11/06/23 0833 11/16/23 1543 11/16/23 1544     Exercise Goal Re-Evaluation   Exercise Goals Review Increase Physical Activity;Increase Strength and Stamina;Understanding of Exercise Prescription Increase Physical Activity;Able to understand and use rate of perceived exertion (RPE) scale;Understanding of Exercise Prescription Increase Strength and Stamina;Increase Physical Activity;Understanding of Exercise Prescription Increase Physical Activity;Increase Strength and Stamina;Understanding of Exercise Prescription --   Comments [name] is doing well in rehab. He  was out last week from a sinus infection.  He was able to walk some at home while he was out.  Yesterday they had him in for a breathing test and he feels like he got a good workout from that.  He was feeling better until he got sick. [name] is doing good in rehab. He has not increased his level on the XR or treadmill in the last two weeks. He is ;eve; 2 on the XR  and speed 2.0 on the treadmill with an RPE of 12. He does have to take breaks on the treadmill due to his legs cramping and feeling tired. He is curretnly exercising at 2.53 METs on  the treadmill. Will continue to monitor and progress as able. [name] is doing well in rehab. He is staying active at home and moving a lot.  He will also do weights at home.  He is feeling better overall. [name] is tolerating exericse well. He has [name] is tolerating exericse well. He has not increased on his levels in the last two weeks. He is exercising at 2.7 METs on the XR at level 2 and a RPE of 12. WIll continue to monitoe and progress as able.   Expected Outcomes Short: Get back to routine of exercise Long: continue to improve stamina Short: increase to level 3 on the XR  long term: continue to attend rehab short; continue to exercise on off days Long; continue to stay activie -- Short: increase level on XR to level 3   long term: continue to exericse at rehab and home            Discharge Exercise Prescription (Final Exercise Prescription Changes):  Exercise Prescription Changes - 12/14/23 1300       Response to Exercise   Blood Pressure (Admit) 110/60    Blood Pressure (Exit) 110/62    Heart Rate (Admit) 60 bpm    Heart Rate (Exercise) 69 bpm    Heart Rate (Exit) 60 bpm    Oxygen Saturation (Admit) 96 %    Oxygen Saturation (Exercise) 90 %    Oxygen Saturation (Exit) 96 %    Rating of Perceived Exertion (Exercise) 12    Perceived  Dyspnea (Exercise) 1    Duration Continue with 30 min of aerobic exercise without signs/symptoms of physical distress.     Intensity THRR unchanged      Progression   Progression Continue to progress workloads to maintain intensity without signs/symptoms of physical distress.      Resistance Training   Training Prescription Yes    Weight 5 lbs    Reps 10-15      Oxygen   Oxygen Continuous    Liters 6      Treadmill   MPH 2    Grade 0    Minutes 15    METs 2.53      REL-XR   Level 2    Speed 54    Minutes 15    METs 2.7      Oxygen   Maintain Oxygen Saturation 88% or higher             Nutrition:  Target Goals: Understanding of nutrition guidelines, daily intake of sodium 1500mg , cholesterol 200mg , calories 30% from fat and 7% or less from saturated fats, daily to have 5 or more servings of fruits and vegetables.  Biometrics:  Pre Biometrics - 09/03/23 1033       Pre Biometrics   Height 5' 9.5" (1.765 m)    Weight 219 lb 14.4 oz (99.7 kg)    Waist Circumference 44 inches    Hip Circumference 42.5 inches    Waist to Hip Ratio 1.04 %    BMI (Calculated) 32.02    Grip Strength 21.7 kg    Single Leg Stand 2.3 seconds              Nutrition Therapy Plan and Nutrition Goals:  Nutrition Therapy & Goals - 09/03/23 0853       Intervention Plan   Intervention Prescribe, educate and counsel regarding individualized specific dietary modifications aiming towards targeted core components such as weight, hypertension, lipid management, diabetes, heart failure and other comorbidities.    Expected Outcomes Short Term Goal: Understand basic principles of dietary content, such as calories, fat, sodium, cholesterol and nutrients.;Long Term Goal: Adherence to prescribed nutrition plan.             Nutrition Assessments:  MEDIFICTS Score Key: >=70 Need to make dietary changes  40-70 Heart Healthy Diet <= 40 Therapeutic Level Cholesterol Diet  Flowsheet Row PULMONARY REHAB OTHER RESPIRATORY from 09/04/2023 in [facility]ITATION  Picture Your Plate Total Score on  Admission 58      Picture Your Plate Scores: <24 Unhealthy dietary pattern with much room for improvement. 41-50 Dietary pattern unlikely to meet recommendations for good health and room for improvement. 51-60 More healthful dietary pattern, with some room for improvement.  >60 Healthy dietary pattern, although there may be some specific behaviors that could be improved.    Nutrition Goals Re-Evaluation:  Nutrition Goals Re-Evaluation     Row Name 09/16/23 [phone] 10/21/23 0819 11/06/23 0828         Goals   Nutrition Goal Healthy eating Short term: continue to eat smaller portions long term: continue to eat healthy Short: Get appetite back Long: Continue to eat healthy     Comment [name] stated that he eats a lot of chicken throughout that week, he will have some beef and fish every once in a while. He also eats lots of veggies with his meals and also will do salads. He normally eats eggs some bacon and toast for breaksfest. He has been  eating smaller portions, more veggies in the last 6 months and has lost about 20 lbs. He has cut down on sweets but does eat a hersey bar during the week but has not had cake and pie in a while. [name] is doing well in rehab. He has not been able to eat and feels bad on his antibiotic from his sinus infection.  He was able to eat last night for first time in a week.  He wants to get to feeling better again. His sister that lives with him helps him stay focused on eating healthy too. [name] is doing well in rehab.  He is still eating with a group and eats more than he was than if he was still by himself.  He appetitie has gotten better.     Expected Outcome Short term: continue to eat smaller portions   long term: continue to eat healthy Short: Get appetite back Long: Continue to eat healthy Short: Continue to eat balance meals Long: Continue to be healthy              Nutrition Goals Discharge (Final Nutrition Goals Re-Evaluation):  Nutrition Goals Re-Evaluation  - 11/06/23 0828       Goals   Nutrition Goal Short: Get appetite back Long: Continue to eat healthy    Comment [name] is doing well in rehab.  He is still eating with a group and eats more than he was than if he was still by himself.  He appetitie has gotten better.    Expected Outcome Short: Continue to eat balance meals Long: Continue to be healthy             Psychosocial: Target Goals: Acknowledge presence or absence of significant depression and/or stress, maximize coping skills, provide positive support system. Participant is able to verbalize types and ability to use techniques and skills needed for reducing stress and depression.  Initial Review & Psychosocial Screening:  Initial Psych Review & Screening - 09/03/23 0853       Initial Review   Current issues with Current Stress Concerns    Source of Stress Concerns Chronic Illness;Unable to perform yard/household activities;Unable to participate in former interests or hobbies;Family    Comments dealing with not being able to go and do like he wants to, loss two wives, last one in March, sister and husband living with them      Family Dynamics   Good Support System? Yes   daughter in law and grandkids live near by, sister and husband living with him currently     Barriers   Psychosocial barriers to participate in program The patient should benefit from training in stress management and relaxation.;Psychosocial barriers identified (see note)      Screening Interventions   Interventions Encouraged to exercise;To provide support and resources with identified psychosocial needs;Provide feedback about the scores to participant    Expected Outcomes Short Term goal: Utilizing psychosocial counselor, staff and physician to assist with identification of specific Stressors or current issues interfering with healing process. Setting desired goal for each stressor or current issue identified.;Long Term Goal: Stressors or current issues are  controlled or eliminated.;Short Term goal: Identification and review with participant of any Quality of Life or Depression concerns found by scoring the questionnaire.;Long Term goal: The participant improves quality of Life and PHQ9 Scores as seen by post scores and/or verbalization of changes             Quality of Life Scores:  Scores of  19 and below usually indicate a poorer quality of life in these areas.  A difference of  2-3 points is a clinically meaningful difference.  A difference of 2-3 points in the total score of the Quality of Life Index has been associated with significant improvement in overall quality of life, self-image, physical symptoms, and general health in studies assessing change in quality of life.   PHQ-9: Review Flowsheet       09/03/2023  Depression screen PHQ 2/9  Decreased Interest 0  Down, Depressed, Hopeless 0  PHQ - 2 Score 0  Altered sleeping 0  Tired, decreased energy 0  Change in appetite 0  Feeling bad or failure about yourself  0  Trouble concentrating 0  Moving slowly or fidgety/restless 0  Suicidal thoughts 0  PHQ-9 Score 0  Difficult doing work/chores Not difficult at all   Interpretation of Total Score  Total Score Depression Severity:  1-4 = Minimal depression, 5-9 = Mild depression, 10-14 = Moderate depression, 15-19 = Moderately severe depression, 20-27 = Severe depression   Psychosocial Evaluation and Intervention:  Psychosocial Evaluation - 09/03/23 0858       Psychosocial Evaluation & Interventions   Interventions Stress management education;Encouraged to exercise with the program and follow exercise prescription    Comments [name] is coming into pulmonary rehab for pulmonary hypertension.  He is a happy and positive person and eager to get going in the program. He had a bad fall back in April over his oxygen tubing and broke three ribs and collapsed his lung.  Since his fall, he has had to rely on his oxygen more.  Previously, he  could go a few hours around the house without it, but now he is on 3-4L constantly for any activity.  He has no history of anixety or depression but lost his 2nd wife to cancer in March of 2023 (1st wife to cancer in 2016).  He enjoys haning out with friends to play card and going to [employer] to read books.   He used to go to [facility] for exercise but the oxygen feels cumberson to him currently but he would like to get back there.  His sister and her husband are currently living with him after they lost their condo, but he enjoys their company and meals together.  He has been working on weight loss and has lost 20 lb since June.  He would like to get down under 200 lb.  He has a new concentrator for oxygen that seems to be working better for him and his breathing.  He wants to get back to whre he was before his fall in April.  He would also like to try to tritrate down on his oxygen needs for activity.  He also has a daughter in law that lives locally with two grandsons that help with yard work.  They also go out to eat at least once a week together.  He has only been doing some weights and walking in the house for acitivity and would like to get back to more regular exercise again.    Expected Outcomes Short: Attend rehab to build stamina and improve breathing Long: Continue to focus on positives and wean oxygen    Continue Psychosocial Services  Follow up required by staff             Psychosocial Re-Evaluation:  Psychosocial Re-Evaluation     Row Name 09/16/23 9147 09/18/23 8295 10/21/23 6213 11/06/23 0820  Psychosocial Re-Evaluation   Current issues with None Identified None Identified;Current Stress Concerns Current Stress Concerns Current Stress Concerns    Comments [name] stated that he has not had any issue with sleep or any major stress factors. He said he has been feeling more energized since starting the program. He talks to his kids and grandkids often and it bring him joy -- [name] is  doing well in rehab.  He was out last week with a sinus infection.  He is now on an antibiotic for 14 days which has him feeling bad.  He is here today, but not feeling the best.  He continues to look out for family and enjoy his grandkids. [name] is doing well in rehab.  He still has his sister living with him and they are eating more.  He is enjoying his sister, but says his brother in law is strange.  He is enjoying the company.  He is feeling better from his sinuses but still bother him some.  He is sleeping well.    Expected Outcomes Short: continue to identify stressors    long term: continue to find ways that make him happy -- Short: Conitnue to recover from infection Long: conitnue to focus on the positives. Short: Continue to enjoy sister Long: Continue to stay positive    Interventions Stress management education;Relaxation education;Encouraged to attend Pulmonary Rehabilitation for the exercise -- Stress management education;Relaxation education;Encouraged to attend Pulmonary Rehabilitation for the exercise Relaxation education;Stress management education;Encouraged to attend Pulmonary Rehabilitation for the exercise    Continue Psychosocial Services  Follow up required by staff -- Follow up required by staff --             Psychosocial Discharge (Final Psychosocial Re-Evaluation):  Psychosocial Re-Evaluation - 11/06/23 0820       Psychosocial Re-Evaluation   Current issues with Current Stress Concerns    Comments [name] is doing well in rehab.  He still has his sister living with him and they are eating more.  He is enjoying his sister, but says his brother in law is strange.  He is enjoying the company.  He is feeling better from his sinuses but still bother him some.  He is sleeping well.    Expected Outcomes Short: Continue to enjoy sister Long: Continue to stay positive    Interventions Relaxation education;Stress management education;Encouraged to attend Pulmonary Rehabilitation for  the exercise              Education: Education Goals: Education classes will be provided on a weekly basis, covering required topics. Participant will state understanding/return demonstration of topics presented.  Learning Barriers/Preferences:  Learning Barriers/Preferences - 09/03/23 1040       Learning Barriers/Preferences   Learning Barriers Sight   contacts   Learning Preferences None             Education Topics: How Lungs Work and Diseases: - Discuss the anatomy of the lungs and diseases that can affect the lungs, such as COPD. Flowsheet Row PULMONARY REHAB OTHER RESPIRATORY from 12/09/2023 in [place] [facility]  Date 11/11/23  Educator [facility]  Instruction Review Code 1- Verbalizes Understanding       Exercise: -Discuss the importance of exercise, FITT principles of exercise, normal and abnormal responses to exercise, and how to exercise safely.   Environmental Irritants: -Discuss types of environmental irritants and how to limit exposure to environmental irritants.   Meds/Inhalers and oxygen: - Discuss respiratory medications, definition of an inhaler and oxygen,  and the proper way to use an inhaler and oxygen.   Energy Saving Techniques: - Discuss methods to conserve energy and decrease shortness of breath when performing activities of daily living.    Bronchial Hygiene / Breathing Techniques: - Discuss breathing mechanics, pursed-lip breathing technique,  proper posture, effective ways to clear airways, and other functional breathing techniques   Cleaning Equipment: - Provides group verbal and written instruction about the health risks of elevated stress, cause of high stress, and healthy ways to reduce stress.   Nutrition I: Fats: - Discuss the types of cholesterol, what cholesterol does to the body, and how cholesterol levels can be controlled.   Nutrition II: Labels: -Discuss the different components of food labels and how to  read food labels.   Respiratory Infections: - Discuss the signs and symptoms of respiratory infections, ways to prevent respiratory infections, and the importance of seeking medical treatment when having a respiratory infection.   Stress I: Signs and Symptoms: - Discuss the causes of stress, how stress may lead to anxiety and depression, and ways to limit stress. Flowsheet Row PULMONARY REHAB OTHER RESPIRATORY from 12/09/2023 in [place] [facility]  Date 10/21/23  Educator [facility]  Instruction Review Code 1- Verbalizes Understanding       Stress II: Relaxation: -Discuss relaxation techniques to limit stress. Flowsheet Row PULMONARY REHAB OTHER RESPIRATORY from 12/09/2023 in [place] [facility]  Date 10/21/23  Educator [facility]  Instruction Review Code 1- Verbalizes Understanding       Oxygen for Home/Travel: - Discuss how to prepare for travel when on oxygen and proper ways to transport and store oxygen to ensure safety.   Knowledge Questionnaire Score:  Knowledge Questionnaire Score - 09/04/23 3875       Knowledge Questionnaire Score   Pre Score 18/18             Core Components/Risk Factors/Patient Goals at Admission:  Personal Goals and Risk Factors at Admission - 09/03/23 0852       Core Components/Risk Factors/Patient Goals on Admission    Weight Management Yes;Obesity;Weight Loss    Intervention Weight Management: Develop a combined nutrition and exercise program designed to reach desired caloric intake, while maintaining appropriate intake of nutrient and fiber, sodium and fats, and appropriate energy expenditure required for the weight goal.;Weight Management: Provide education and appropriate resources to help participant work on and attain dietary goals.;Weight Management/Obesity: Establish reasonable short term and long term weight goals.;Obesity: Provide education and appropriate resources to help participant work on and attain dietary  goals.    Admit Weight 219 lb 14.4 oz (99.7 kg)    Goal Weight: Short Term 215 lb (97.5 kg)    Goal Weight: Long Term 199 lb (90.3 kg)    Expected Outcomes Short Term: Continue to assess and modify interventions until short term weight is achieved;Long Term: Adherence to nutrition and physical activity/exercise program aimed toward attainment of established weight goal;Weight Loss: Understanding of general recommendations for a balanced deficit meal plan, which promotes 1-2 lb weight loss per week and includes a negative energy balance of [phone] kcal/d;Understanding recommendations for meals to include 15-35% energy as protein, 25-35% energy from fat, 35-60% energy from carbohydrates, less than 200mg  of dietary cholesterol, 20-35 gm of total fiber daily;Understanding of distribution of calorie intake throughout the day with the consumption of 4-5 meals/snacks    Improve shortness of breath with ADL's Yes    Intervention Provide education, individualized exercise plan and daily activity instruction to help  decrease symptoms of SOB with activities of daily living.    Expected Outcomes Short Term: Improve cardiorespiratory fitness to achieve a reduction of symptoms when performing ADLs;Long Term: Be able to perform more ADLs without symptoms or delay the onset of symptoms    Increase knowledge of respiratory medications and ability to use respiratory devices properly  Yes    Intervention Provide education and demonstration as needed of appropriate use of medications, inhalers, and oxygen therapy.    Expected Outcomes Short Term: Achieves understanding of medications use. Understands that oxygen is a medication prescribed by physician. Demonstrates appropriate use of inhaler and oxygen therapy.;Long Term: Maintain appropriate use of medications, inhalers, and oxygen therapy.    Diabetes Yes    Intervention Provide education about signs/symptoms and action to take for hypo/hyperglycemia.;Provide education  about proper nutrition, including hydration, and aerobic/resistive exercise prescription along with prescribed medications to achieve blood glucose in normal ranges: Fasting glucose 65-99 mg/dL    Expected Outcomes Short Term: Participant verbalizes understanding of the signs/symptoms and immediate care of hyper/hypoglycemia, proper foot care and importance of medication, aerobic/resistive exercise and nutrition plan for blood glucose control.;Long Term: Attainment of HbA1C < 7%.    Heart Failure Yes    Intervention Provide a combined exercise and nutrition program that is supplemented with education, support and counseling about heart failure. Directed toward relieving symptoms such as shortness of breath, decreased exercise tolerance, and extremity edema.    Expected Outcomes Improve functional capacity of life;Short term: Attendance in program 2-3 days a week with increased exercise capacity. Reported lower sodium intake. Reported increased fruit and vegetable intake. Reports medication compliance.;Short term: Daily weights obtained and reported for increase. Utilizing diuretic protocols set by physician.;Long term: Adoption of self-care skills and reduction of barriers for early signs and symptoms recognition and intervention leading to self-care maintenance.    Hypertension Yes    Intervention Provide education on lifestyle modifcations including regular physical activity/exercise, weight management, moderate sodium restriction and increased consumption of fresh fruit, vegetables, and low fat dairy, alcohol moderation, and smoking cessation.;Monitor prescription use compliance.    Expected Outcomes Short Term: Continued assessment and intervention until BP is < 140/11mm HG in hypertensive participants. < 130/22mm HG in hypertensive participants with diabetes, heart failure or chronic kidney disease.;Long Term: Maintenance of blood pressure at goal levels.    Lipids Yes    Intervention Provide education  and support for participant on nutrition & aerobic/resistive exercise along with prescribed medications to achieve LDL 70mg , HDL >40mg .    Expected Outcomes Short Term: Participant states understanding of desired cholesterol values and is compliant with medications prescribed. Participant is following exercise prescription and nutrition guidelines.;Long Term: Cholesterol controlled with medications as prescribed, with individualized exercise RX and with personalized nutrition plan. Value goals: LDL < 70mg , HDL > 40 mg.             Core Components/Risk Factors/Patient Goals Review:   Goals and Risk Factor Review     Row Name 09/16/23 0831 10/21/23 0820 11/06/23 0816         Core Components/Risk Factors/Patient Goals Review   Personal Goals Review Weight Management/Obesity;Improve shortness of breath with ADL's Weight Management/Obesity;Improve shortness of breath with ADL's;Increase knowledge of respiratory medications and ability to use respiratory devices properly.;Heart Failure;Hypertension Weight Management/Obesity;Improve shortness of breath with ADL's;Increase knowledge of respiratory medications and ability to use respiratory devices properly.;Heart Failure;Hypertension     Review [name] has been managing his weight by watching what he is eating.  He has cut down on portions of meals. He has also cut out pastas and most breads other them his morning toast. He has also cut out alcohol. He has noticed improvment in his breathing when doing activities in town. He uses PLB when he feels SOB. [name] is doing well in rehab.  He was out last week with a sinus infection.  Despite not eating, his weight is up.  He has not been as active this week and drinking more water so he may be retaining fluid.  He is good about using his PLB to help with his breathing.  He was doing better with his breathing before he got sick.  His pressures are doing well.  He had breathing test yesterday and is going to ENT  today. [name] is doing well in rehab.  He is supposed to start a new nebulizer soon.  It is one of the new ones out there and he doesn't know name of it.  He is supposed to get supplies on Saturday for it.  His doctor is hoping that it will help with his breathing.  Its supposed to work on two different enzymes.  His weight is trending down some.  He is still eating with the others.  He has not had any herat failure symptoms.  He still has some swelling but wearing his compression socks and using fluid pill.  His pressure are doing well.     Expected Outcomes Short term: Continue to manage waright   long term: continue to exercise for happiness Short: Get back to routine exercise Long: conitnue to monitor risk factors Short: Bring in name of new nebulizer Long: Continue to work on improving breathing              Core Components/Risk Factors/Patient Goals at Discharge (Final Review):   Goals and Risk Factor Review - 11/06/23 0816       Core Components/Risk Factors/Patient Goals Review   Personal Goals Review Weight Management/Obesity;Improve shortness of breath with ADL's;Increase knowledge of respiratory medications and ability to use respiratory devices properly.;Heart Failure;Hypertension    Review [name] is doing well in rehab.  He is supposed to start a new nebulizer soon.  It is one of the new ones out there and he doesn't know name of it.  He is supposed to get supplies on Saturday for it.  His doctor is hoping that it will help with his breathing.  Its supposed to work on two different enzymes.  His weight is trending down some.  He is still eating with the others.  He has not had any herat failure symptoms.  He still has some swelling but wearing his compression socks and using fluid pill.  His pressure are doing well.    Expected Outcomes Short: Bring in name of new nebulizer Long: Continue to work on improving breathing             ITP Comments:  ITP Comments     Row Name 09/03/23  1011 09/04/23 0801 09/09/23 0750 10/07/23 0639 12/02/23 0802   ITP Comments Patient attend orientation today.  Patient is attending[facility].  Documentation for diagnosis can be found in [facility] 09/01/23.  Reviewed medical chart, RPE/RPD, gym safety, and program guidelines.  Patient was fitted to equipment they will be using during rehab.  Patient is scheduled to start exercise on 09/04/23 at 745.   Initial ITP created and sent for review and signature by Dr. Erick Blinks, Medical Director for  Pulmonary Rehabilitation Program. First full day of exercise!  Patient was oriented to gym and equipment including functions, settings, policies, and procedures.  Patient's individual exercise prescription and treatment plan were reviewed.  All starting workloads were established based on the results of the 6 minute walk test done at initial orientation visit.  The plan for exercise progression was also introduced and progression will be customized based on patient's performance and goals. 30 day review completed. ITP sent to Dr.Jehanzeb Memon, Medical Director of  Pulmonary Rehab. Continue with ITP unless changes are made by physician.  New to program. 30 day review completed. ITP sent to Dr.Jehanzeb Memon, Medical Director of  Pulmonary Rehab. Continue with ITP unless changes are made by physician. 30 day review completed. ITP sent to Dr.Jehanzeb Memon, Medical Director of  Pulmonary Rehab. Continue with ITP unless changes are made by physician.    Row Name 12/29/23 0730 01/15/24 1208         ITP Comments 30 day review completed. ITP sent to Dr.Jehanzeb Memon, Medical Director of  Pulmonary Rehab. Continue with ITP unless changes are made by physician.  Pt has not attended since 12/14/23.  Has one visit left. Called patient about returning to rehab. He has been doing some  physical therapy at home for bursitis in his hip.  He doesn't plan on returning to rehab.        Discharge ITP created and sent  for review and signature.               Comments: discharge ITP

## 2024-01-15 NOTE — Progress Notes (Signed)
Discharge Progress Report  Patient Details  Name: Clarence Dawson MRN: [id number] Date of Birth: May 15, 1946 Referring Provider:   Flowsheet Row PULMONARY REHAB OTHER RESP ORIENTATION from 09/03/2023 in [facility] CARDIAC REHABILITATION  Referring Provider Marca Ancona MD        Number of Visits: 35  Reason for Discharge:  Patient reached a stable level of exercise. Patient independent in their exercise. Patient has met program and personal goals. Early Exit:  Personal  Smoking History:  Social History   Tobacco Use  Smoking Status Former   Current packs/day: 0.00   Average packs/day: 3.0 packs/day for 26.0 years (77.9 ttl pk-yrs)   Types: Cigarettes   Start date: 02/20/1958   Quit date: 02/12/1984   Years since quitting: 39.9  Smokeless Tobacco Former   Types: Chew  Tobacco Comments   Former smoker 08/30/2021    Diagnosis:  Pulmonary hypertension (HCC)  ADL UCSD:  Pulmonary Assessment Scores     Row Name 09/04/23 [phone]         ADL UCSD   ADL Phase Entry     SOB Score total 47     Rest 1     Walk 3     Stairs 3     Bath 1     Dress 2     Shop 2       CAT Score   CAT Score 16       mMRC Score   mMRC Score 1              Initial Exercise Prescription:  Initial Exercise Prescription - 09/03/23 1000       Date of Initial Exercise RX and Referring Provider   Date 09/03/23    Referring Provider Marca Ancona MD      Oxygen   Oxygen Continuous    Liters 4    Maintain Oxygen Saturation 88% or higher      Treadmill   MPH 2.2    Grade 0.5    Minutes 15    METs 2.84      REL-XR   Level 3    Speed 50    Minutes 15    METs 2.3      Prescription Details   Frequency (times per week) 3    Duration Progress to 30 minutes of continuous aerobic without signs/symptoms of physical distress      Intensity   THRR 40-80% of Max Heartrate 93-126    Ratings of Perceived Exertion 11-13    Perceived Dyspnea 0-4      Progression   Progression  Continue to progress workloads to maintain intensity without signs/symptoms of physical distress.      Resistance Training   Training Prescription Yes    Weight 5 lb    Reps 10-15             Discharge Exercise Prescription (Final Exercise Prescription Changes):  Exercise Prescription Changes - 12/14/23 1300       Response to Exercise   Blood Pressure (Admit) 110/60    Blood Pressure (Exit) 110/62    Heart Rate (Admit) 60 bpm    Heart Rate (Exercise) 69 bpm    Heart Rate (Exit) 60 bpm    Oxygen Saturation (Admit) 96 %    Oxygen Saturation (Exercise) 90 %    Oxygen Saturation (Exit) 96 %    Rating of Perceived Exertion (Exercise) 12    Perceived Dyspnea (Exercise) 1    Duration Continue with 30  min of aerobic exercise without signs/symptoms of physical distress.    Intensity THRR unchanged      Progression   Progression Continue to progress workloads to maintain intensity without signs/symptoms of physical distress.      Resistance Training   Training Prescription Yes    Weight 5 lbs    Reps 10-15      Oxygen   Oxygen Continuous    Liters 6      Treadmill   MPH 2    Grade 0    Minutes 15    METs 2.53      REL-XR   Level 2    Speed 54    Minutes 15    METs 2.7      Oxygen   Maintain Oxygen Saturation 88% or higher             Functional Capacity:  6 Minute Walk     Row Name 09/03/23 1012         6 Minute Walk   Phase Initial     Distance 1120 feet     Walk Time 5.17 minutes     # of Rest Breaks 1  50 sec     MPH 2.46     METS 1.69     RPE 14     Perceived Dyspnea  2     VO2 Peak 5.91     Symptoms Yes (comment)     Comments SOB, calves painful 8/10     Resting HR 60 bpm     Resting BP 124/72     Resting Oxygen Saturation  92 %     Exercise Oxygen Saturation  during 6 min walk 87 %     Max Ex. HR 64 bpm     Max Ex. BP 146/64     2 Minute Post BP 132/70       Interval HR   1 Minute HR 60     2 Minute HR 61     3 Minute HR 62      4 Minute HR 64     5 Minute HR 60     6 Minute HR 61     2 Minute Post HR 60     Interval Heart Rate? Yes       Interval Oxygen   Interval Oxygen? Yes     Baseline Oxygen Saturation % 92 %     1 Minute Oxygen Saturation % 87 %     1 Minute Liters of Oxygen 4 L  pulsed     2 Minute Oxygen Saturation % 87 %     2 Minute Liters of Oxygen 4 L     3 Minute Oxygen Saturation % 88 %     3 Minute Liters of Oxygen 4 L     4 Minute Oxygen Saturation % 89 %     4 Minute Liters of Oxygen 4 L     5 Minute Oxygen Saturation % 87 %     5 Minute Liters of Oxygen 4 L     6 Minute Oxygen Saturation % 88 %     6 Minute Liters of Oxygen 4 L     2 Minute Post Oxygen Saturation % 90 %     2 Minute Post Liters of Oxygen 4 L              Psychological, QOL, Others - Outcomes: PHQ 2/9:    09/03/2023  8:43 AM  Depression screen PHQ 2/9  Decreased Interest 0  Down, Depressed, Hopeless 0  PHQ - 2 Score 0  Altered sleeping 0  Tired, decreased energy 0  Change in appetite 0  Feeling bad or failure about yourself  0  Trouble concentrating 0  Moving slowly or fidgety/restless 0  Suicidal thoughts 0  PHQ-9 Score 0  Difficult doing work/chores Not difficult at all     Nutrition & Weight - Outcomes:  Pre Biometrics - 09/03/23 1033       Pre Biometrics   Height 5' 9.5" (1.765 m)    Weight 219 lb 14.4 oz (99.7 kg)    Waist Circumference 44 inches    Hip Circumference 42.5 inches    Waist to Hip Ratio 1.04 %    BMI (Calculated) 32.02    Grip Strength 21.7 kg    Single Leg Stand 2.3 seconds             Education Questionnaire Score:  Knowledge Questionnaire Score - 09/04/23 0821       Knowledge Questionnaire Score   Pre Score 18/18           t.

## 2024-01-15 NOTE — Telephone Encounter (Signed)
Called patient about returning to rehab. He has been doing some  physical therapy at home for bursitis in his hip.  He doesn't plan on returning to rehab.

## 2024-01-20 ENCOUNTER — Ambulatory Visit
Admission: EM | Admit: 2024-01-20 | Discharge: 2024-01-20 | Disposition: A | Discharge status: Home / Self Care | Payer: Medicare Other | Attending: Nurse Practitioner | Admitting: Nurse Practitioner

## 2024-01-20 DIAGNOSIS — J0141 Acute recurrent pansinusitis: Secondary | ICD-10-CM | POA: Diagnosis not present

## 2024-01-20 MED ORDER — AMOXICILLIN-POT CLAVULANATE 875-125 MG PO TABS: 1.0000 | ORAL_TABLET | Freq: Two times a day (BID) | ORAL | 0 refills | Status: AC

## 2024-01-20 NOTE — Discharge Instructions (Signed)
Take the Augmentin as prescribed to treat for sinus infection.  Continue sinus rinses.  Follow-up with your ENT, Dr. Allena Katz if symptoms do not fully improve with treatment.

## 2024-01-20 NOTE — ED Provider Notes (Signed)
[facility]    CSN: [id number] Arrival date & time: 01/20/24  0913      History   Chief Complaint No chief complaint on file.   HPI Clarence Dawson is a 78 y.o. male.   Patient presents today with 2-day history of left-sided facial pain and pressure as well as increase in drainage from the left side of his face.  Reports he performed a saline rinse this morning and got out a lot of thick, dark-colored mucus and has had some bleeding ever since.  He denies fever, body aches or chills, significant runny or stuffy nose, sore throat, or headache.  He does have chronic respiratory failure and is O2 dependent at baseline, denies new shortness of breath, wheezing, chest pain or tightness.  Denies having to increase oxygen use at home to ensure normal oxygen levels.  No abdominal pain, nausea/vomiting, or diarrhea.  He is unable to take many over-the-counter medicines due to history of heart failure, takes Tikosyn daily.  Patient reports he is wheezing today, reports this is normal for him in the morning.  After he takes a few breathing treatment drinks a couple cups of coffee, the wheezing typically subsides.  He reports his breathing is normal for him today.  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  Review of chart shows he was treated earlier this month with minocycline for a possible respiratory infection by pulmonology and has follow-up with pulmonology already scheduled.    Past Medical History:  Diagnosis Date   Allergic rhinitis    Aortic valve disorder    Asthma    since childhood- seasonal allergies induced   Cancer (HCC)    Skin cancer- squamous, basal   Carotid artery stenosis    Essential hypertension    Full dentures    GERD (gastroesophageal reflux disease)    H/O hiatal hernia    Hemorrhage of rectum     Hyperlipidemia    Hypothyroidism    Male circumcision    OSA (obstructive sleep apnea)    Osteoarthritis    Pacemaker    Oct 2005 in [place].   PAF (paroxysmal atrial fibrillation) (HCC)    Pneumonia    "several Times" 2015 last time   Primary localized osteoarthritis of right knee 08/11/2017   RBBB (right bundle branch block)    Sinoatrial node dysfunction (HCC)    Syncope    Tricuspid valve disorder    Type 2 diabetes mellitus (HCC)    Type II    Patient Active Problem List   Diagnosis Date Noted   Chronic respiratory failure with hypoxia (HCC) 06/12/2023   Hypercoagulable state due to persistent atrial fibrillation (HCC) 01/02/2020   Chronic diastolic heart failure (HCC) 11/22/2019   Paroxysmal atrial fibrillation (HCC) 09/22/2019   Atrial fibrillation with RVR (HCC)    Lobar pneumonia (HCC) 09/21/2019   Acute respiratory failure with hypoxia (HCC) 09/21/2019   Acute on chronic respiratory failure (HCC) 09/18/2019   Severe sepsis (HCC)    Elevated lactic acid level    GERD (gastroesophageal reflux disease)    Chronic bronchitis (HCC) 02/09/2019   CAP (community acquired pneumonia) 03/03/2018   Primary localized osteoarthritis of right knee 08/11/2017   Asthma with acute exacerbation 12/08/2016   Chest pain 10/20/2016   HTN (hypertension) 10/20/2016   Morbid obesity due to excess calories (HCC) 06/24/2016  Severe persistent asthma 02/12/2016   Upper airway cough syndrome 01/24/2016   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   Persistent atrial fibrillation    Syncope    Hyperthyroidism    Hyperlipidemia    RBBB (right bundle branch block)    Tricuspid valve disorder    Aortic valve disorder    Carotid artery stenosis    Type 2 diabetes mellitus (HCC)    Pacemaker    OSA (obstructive sleep apnea)    Arthritis    Sinoatrial node dysfunction (HCC)    PVC's (premature ventricular contractions) 07/17/2011   Essential hypertension, benign 01/24/2011   Premature ventricular  contractions 01/24/2011   PPM-St.[facility]ude 01/24/2011    Past Surgical History:  Procedure Laterality Date   A-V CARDIAC PACEMAKER INSERTION     Sick sinus syndrome DDR pacer   Arthropathy Right 2005   Rebuilding of left thumb and joint    CARDIAC CATHETERIZATION     CARDIAC ELECTROPHYSIOLOGY STUDY AND ABLATION  09/2008   for pvcs, Dr. Vesta Mixer   CARDIOVERSION N/A 12/18/2016   Procedure: CARDIOVERSION;  Surgeon: Chrystie Nose, MD;  Location: [facility] ENDOSCOPY;  Service: Cardiovascular;  Laterality: N/A;   CARDIOVERSION N/A 09/05/2021   Procedure: CARDIOVERSION;  Surgeon: Sande Rives, MD;  Location: [facility] ENDOSCOPY;  Service: Cardiovascular;  Laterality: N/A;   CARDIOVERSION N/A 08/06/2022   Procedure: CARDIOVERSION;  Surgeon: Little Ishikawa, MD;  Location: [facility] ENDOSCOPY;  Service: Cardiovascular;  Laterality: N/A;   CARPAL TUNNEL RELEASE  1994   right wrist   CARPAL TUNNEL RELEASE  05/04/2012   Procedure: CARPAL TUNNEL RELEASE;  Surgeon: Nicki Reaper, MD;  Location: [facility];  Service: Orthopedics;  Laterality: Left;   CARPOMETACARPEL SUSPENSION PLASTY Right 11/16/2014   Procedure: SUSPENSIONPLASTY RIGHT THUMB TENDON TRANSFER ABDUCTOR POLLICUS LONGUS EXCISION TRAPEZIUM;  Surgeon: Cindee Salt, MD;  Location: [facility];  Service: Orthopedics;  Laterality: Right;   CHOLECYSTECTOMY  1994   CIRCUMCISION     COLONOSCOPY N/A 03/14/2013   Procedure: COLONOSCOPY;  Surgeon: Corbin Ade, MD;  Location[facility];  Service: Endoscopy;  Laterality: N/A;  8:15 AM   EYE SURGERY     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-[facility]   EYE SURGERY  2012   Left eye Corneal transplant- partial- Cataract   FLEXIBLE BRONCHOSCOPY Right 06/23/2019   Procedure: FLEXIBLE BRONCHOSCOPY RIGHT;  Surgeon: Shane Crutch, MD;  Location: [facility] ORS;  Service: Pulmonary;  Laterality: Right;   GALLBLADDER SURGERY  12/01/2006   HAND TENDON SURGERY Left late 1990's   thumb    HEMORROIDECTOMY  2003   INJECTION KNEE Left 08/26/2017   Procedure: LEFT KNEE INJECTION;  Surgeon: Salvatore Marvel, MD;  Location: [facility] OR;  Service: Orthopedics;  Laterality: Left;   left Knee Arthroscopy     [facility]   PARTIAL KNEE ARTHROPLASTY  11/22/2012   Procedure: UNICOMPARTMENTAL KNEE;  Surgeon: Nilda Simmer, MD;  Location: [facility] OR;  Service: Orthopedics;  Laterality: Left;  left unicompartmental knee arthroplasty   PERMANENT PACEMAKER GENERATOR CHANGE N/A 01/12/2013   Procedure: PERMANENT PACEMAKER GENERATOR CHANGE;  Surgeon: Marinus Maw, MD;  Location: [facility] CATH LAB;  Service: Cardiovascular;  Laterality: N/A;   PPM GENERATOR CHANGEOUT N/A 07/07/2022   Procedure: PPM GENERATOR CHANGEOUT;  Surgeon: Marinus Maw, MD;  Location: [facility] INVASIVE CV LAB;  Service: Cardiovascular;  Laterality: N/A;   RIGHT/LEFT HEART CATH AND CORONARY ANGIOGRAPHY N/A 08/28/2023   Procedure: RIGHT/LEFT HEART CATH AND CORONARY  ANGIOGRAPHY;  Surgeon: Laurey Morale, MD;  Location: [facility] INVASIVE CV LAB;  Service: Cardiovascular;  Laterality: N/A;   Rotator cuff Surgery  2001   Right shoulder   TONSILLECTOMY     TOTAL KNEE ARTHROPLASTY Right 08/26/2017   Procedure: TOTAL KNEE ARTHROPLASTY;  Surgeon: Salvatore Marvel, MD;  Location: [facility] OR;  Service: Orthopedics;  Laterality: Right;   varicose vein reduction         Home Medications    Prior to Admission medications   Medication Sig Start Date End Date Taking? Authorizing Provider  acetaminophen (TYLENOL) 500 MG tablet Take 1,000 mg by mouth 3 (three) times daily.    [provider]  albuterol (PROVENTIL) (2.5 MG/3ML) 0.083% nebulizer solution Take 3 mLs (2.5 mg total) by nebulization every 6 (six) hours as needed for wheezing or shortness of breath. 11/16/23   Salena Saner, MD  albuterol (VENTOLIN HFA) 108 (90 Base) MCG/ACT inhaler Inhale 2 puffs into the lungs every 4 (four) hours as needed for shortness of breath (only if  you can't catch your breath/ asthma). 05/19/23   Salena Saner, MD  amoxicillin-clavulanate (AUGMENTIN) 875-125 MG tablet Take 1 tablet by mouth 2 (two) times daily for 10 days. 01/20/24 01/30/24 Yes Valentino Nose, NP  atorvastatin (LIPITOR) 40 MG tablet Take 1 tablet (40 mg total) by mouth at bedtime. 12/16/23   Laurey Morale, MD  bisoprolol (ZEBETA) 5 MG tablet TAKE 1 AND 1/2 TABLET BY MOUTH TWICE DAILY. 04/17/17   Newman Nip, NP  BREZTRI AEROSPHERE 160-9-4.8 MCG/ACT AERO INHALE 2 PUFFS INTO THE LUNGS TWICE DAILY. 09/18/23   Salena Saner, MD  Carboxymethylcellul-Glycerin (LUBRICATING EYE DROPS OP) Place 1 drop into the right eye 2 (two) times a week. Clear eyes    [provider]  diltiazem (CARDIZEM CD) 120 MG 24 hr capsule TAKE (1) CAPSULE BY MOUTH DAILY, MAY TAKE A EXTRA CAPSULE DAILY FOR BREAKTHROUGH AFIB. 12/09/23   Marinus Maw, MD  dofetilide (TIKOSYN) 250 MCG capsule Take 1 capsule (250 mcg total) by mouth 2 (two) times daily. 12/17/23   Laurey Morale, MD  Dupilumab (DUPIXENT) 300 MG/2ML SOAJ Inject 300 mg into the skin every 14 (fourteen) days. 11/09/23   Salena Saner, MD  Ensifentrine [facility]) 3 MG/2.5ML SUSP Take 3 mg by nebulization 2 (two) times daily. 10/26/23   Salena Saner, MD  EPINEPHrine 0.3 mg/0.3 mL IJ SOAJ injection Inject 0.3 mg into the muscle as needed for anaphylaxis. 11/05/20   Salena Saner, MD  fluticasone (FLONASE) 50 MCG/ACT nasal spray Place 2 sprays into both nostrils daily. 10/21/23   Read Drivers, MD  furosemide (LASIX) 40 MG tablet Take 1 tablet (40 mg total) by mouth 2 (two) times daily. 12/16/23   Laurey Morale, MD  glipiZIDE (GLUCOTROL XL) 5 MG 24 hr tablet Take 5 mg by mouth daily before breakfast.    [provider]  hydrocortisone cream 0.5 % Apply 1 Application topically daily as needed for itching.    [provider]  JARDIANCE 25 MG TABS tablet Take 25 mg by mouth daily before  breakfast. 04/11/21   [provider]  levothyroxine (SYNTHROID) 150 MCG tablet Take 150 mcg by mouth daily before breakfast. 06/01/23   [provider]  losartan (COZAAR) 50 MG tablet Take 50 mg by mouth at bedtime. 04/27/20   [provider]  metFORMIN (GLUCOPHAGE-XR) 500 MG 24 hr tablet Take 500 mg by mouth in the  morning and at bedtime.    [provider]  minocycline (MINOCIN) 100 MG capsule Take two capsules at first dose then 1 capsule twice a day until all gone. 01/01/24   Salena Saner, MD  Multiple Vitamin (MULTIVITAMIN) tablet Take 1 tablet by mouth daily.    [provider]  omeprazole (PRILOSEC OTC) 20 MG tablet Take 20 mg by mouth every morning.    [provider]  PRESCRIPTION MEDICATION Thumper vest    [provider]  Respiratory Therapy Supplies (FLUTTER) DEVI Use as directed 10/10/19   Salena Saner, MD  rivaroxaban (XARELTO) 20 MG TABS tablet Take 20 mg by mouth at bedtime.    [provider]  sodium chloride (OCEAN) 0.65 % SOLN nasal spray Place 1 spray into both nostrils as needed for congestion.    [provider]    Family History Family History  Problem Relation Age of Onset   Liver cancer Mother    Pancreatic cancer Mother    Colon cancer Mother    Hypertension Father    Stroke Father    Other Father 54       Sudden Cardiac death   Heart attack Father    Cancer Sister        brain   Diabetes Sister    Colon cancer Maternal Aunt    Colon polyps Neg Hx     Social History Social History   Tobacco Use   Smoking status: Former    Current packs/day: 0.00    Average packs/day: 3.0 packs/day for 26.0 years (77.9 ttl pk-yrs)    Types: Cigarettes    Start date: 02/20/1958    Quit date: 02/12/1984    Years since quitting: 39.9   Smokeless tobacco: Former    Types: Chew   Tobacco comments:    Former smoker 08/30/2021  Vaping Use   Vaping status: Never Used  Substance Use  Topics   Alcohol use: Yes    Alcohol/week: 2.0 - 4.0 standard drinks of alcohol    Types: 1 - 2 Glasses of wine, 1 - 2 Cans of beer per week    Comment: 1 glass of wine or beer 3 times a week 12/26/22   Drug use: No     Allergies   Food, Iodinated contrast media, Shellfish allergy, Goat-derived products, Prednisone, Diclofenac sodium, Metformin and related, Vancomycin, and Voltaren [diclofenac sodium]   Review of Systems Review of Systems Per HPI  Physical Exam Triage Vital Signs ED Triage Vitals  Encounter Vitals Group     BP 01/20/24 0939 127/70     Systolic BP Percentile --      Diastolic BP Percentile --      Pulse Rate 01/20/24 0939 (!) 59     Resp 01/20/24 0939 19     Temp 01/20/24 0939 98 F (36.7 C)     Temp Source 01/20/24 0939 Oral     SpO2 01/20/24 0939 90 %     Weight --      Height --      Head Circumference --      Peak Flow --      Pain Score 01/20/24 0944 7     Pain Loc --      Pain Education --      Exclude from Growth Chart --    No data found.  Updated Vital Signs BP 127/70 (BP Location: Right Arm)   Pulse (!) 59   Temp 98 F (36.7 C) (  Oral)   Resp 19   SpO2 90%   Visual Acuity Right Eye Distance:   Left Eye Distance:   Bilateral Distance:    Right Eye Near:   Left Eye Near:    Bilateral Near:     Physical Exam Vitals and nursing note reviewed.  Constitutional:      General: He is not in acute distress.    Appearance: Normal appearance. He is not ill-appearing or toxic-appearing.  HENT:     Head: Normocephalic and atraumatic.     Right Ear: Tympanic membrane, ear canal and external ear normal.     Left Ear: Tympanic membrane, ear canal and external ear normal.     Nose: Congestion present. No rhinorrhea.     Right Turbinates: Enlarged.     Left Turbinates: Enlarged.     Left Sinus: Maxillary sinus tenderness and frontal sinus tenderness present.     Comments: Dried blood noted to left turbinate    Mouth/Throat:     Mouth:  Mucous membranes are moist.     Pharynx: Oropharynx is clear. Posterior oropharyngeal erythema present. No oropharyngeal exudate.  Eyes:     General: No scleral icterus.    Extraocular Movements: Extraocular movements intact.  Cardiovascular:     Rate and Rhythm: Normal rate and regular rhythm.  Pulmonary:     Effort: Pulmonary effort is normal. No respiratory distress.     Breath sounds: Wheezing present. No rhonchi or rales.  Musculoskeletal:     Cervical back: Normal range of motion and neck supple.  Lymphadenopathy:     Cervical: No cervical adenopathy.  Skin:    General: Skin is warm and dry.     Coloration: Skin is not jaundiced or pale.     Findings: No erythema or rash.  Neurological:     Mental Status: He is alert and oriented to person, place, and time.  Psychiatric:        Behavior: Behavior is cooperative.      UC Treatments / Results  Labs (all labs ordered are listed, but only abnormal results are displayed) Labs Reviewed - No data to display  EKG   Radiology No results found.  Procedures Procedures (including critical care time)  Medications Ordered in UC Medications - No data to display  Initial Impression / Assessment and Plan / UC Course  I have reviewed the triage vital signs and the nursing notes.  Pertinent labs & imaging results that were available during my care of the patient were reviewed by me and considered in my medical decision making (see chart for details).   Patient is well-appearing, normotensive, afebrile, not tachycardic, not tachypneic, oxygenating well on room air.    1. Acute recurrent pansinusitis Given history, will treat with Augmentin twice daily for 10 days Recommended continuing nasal saline rinses and close follow-up with Dr. Allena Katz of ENT; patient is in agreement to plan  The patient was given the opportunity to ask questions.  All questions answered to their satisfaction.  The patient is in agreement to this plan.     Final Clinical Impressions(s) / UC Diagnoses   Final diagnoses:  Acute recurrent pansinusitis     Discharge Instructions      Take the Augmentin as prescribed to treat for sinus infection.  Continue sinus rinses.  Follow-up with your ENT, Dr. Allena Katz if symptoms do not fully improve with treatment.   ED Prescriptions     Medication Sig Dispense Auth. Provider   amoxicillin-clavulanate (AUGMENTIN) 875-125  MG tablet Take 1 tablet by mouth 2 (two) times daily for 10 days. 20 tablet Valentino Nose, NP      PDMP not reviewed this encounter.   Valentino Nose, NP 01/20/24 1010

## 2024-01-20 NOTE — ED Triage Notes (Signed)
Pt reports sinus drainage from left nostril, left sided facial pain. States he has does an at home nasal lavage and was able to get out some mucus and dried blood.

## 2024-01-25 ENCOUNTER — Ambulatory Visit: Payer: Medicare Other | Admitting: Pulmonary Disease

## 2024-01-25 ENCOUNTER — Encounter: Payer: Self-pay | Admitting: Pulmonary Disease

## 2024-01-25 VITALS — BP 160/60 | HR 60 | Temp 98.5°F | Ht 69.0 in | Wt 225.6 lb

## 2024-01-25 DIAGNOSIS — J471 Bronchiectasis with (acute) exacerbation: Secondary | ICD-10-CM

## 2024-01-25 DIAGNOSIS — J9611 Chronic respiratory failure with hypoxia: Secondary | ICD-10-CM

## 2024-01-25 DIAGNOSIS — J4489 Other specified chronic obstructive pulmonary disease: Secondary | ICD-10-CM

## 2024-01-25 DIAGNOSIS — G4733 Obstructive sleep apnea (adult) (pediatric): Secondary | ICD-10-CM

## 2024-01-25 DIAGNOSIS — I272 Pulmonary hypertension, unspecified: Secondary | ICD-10-CM | POA: Diagnosis not present

## 2024-01-25 NOTE — Patient Instructions (Signed)
VISIT SUMMARY:  During today's visit, we discussed your ongoing respiratory symptoms, including increased sputum production and occasional blood in the sputum. We reviewed your current treatments and considered adjustments to better manage your conditions. We also talked about your history of sinus issues and the effectiveness of your current medications.  YOUR PLAN:  -CHRONIC OBSTRUCTIVE PULMONARY DISEASE (COPD): COPD is a chronic lung disease that causes obstructed airflow from the lungs. You are experiencing ongoing symptoms, including a productive cough and occasional blood in the sputum. We will continue your current amoxicillin treatment and explore the possibility of increasing your Dupixent injections to weekly to better manage your symptoms.  -SEVERE PERSISTENT ASTHMA: Asthma is a condition in which your airways narrow and swell, producing extra mucus. Your asthma is currently managed with Dupixent, but you experience increased symptoms towards the end of your dosing cycle. We will research the possibility of weekly Dupixent dosing to help manage your symptoms more effectively.  -BRONCHIECTASIS: Bronchiectasis is a condition where the bronchial tubes in your lungs are permanently damaged, widened, and thickened. This leads to a persistent productive cough. You will continue your current antibiotic regimen, and we will monitor your symptoms to adjust treatment as needed.  -CHRONIC RESPIRATORY FAILURE: Chronic respiratory failure occurs when your lungs can't get enough oxygen into your blood. Your condition is secondary to severe COPD and bronchiectasis, and you are on long-term oxygen therapy. We will continue your current management plan.  -PULMONARY HYPERTENSION: Pulmonary hypertension is high blood pressure in the arteries to your lungs. It is secondary to your chronic lung disease. Since you have no new symptoms, we will continue your current management plan.  -OBSTRUCTIVE SLEEP APNEA:  Obstructive sleep apnea is a condition where your breathing repeatedly stops and starts during sleep. It is managed with CPAP therapy, and no new issues have been reported. We will continue your current CPAP therapy.  INSTRUCTIONS:  Please follow up in 6-8 weeks. We will continue to monitor your symptoms and adjust your treatment plan as needed. In the meantime, continue your current medications and therapies as discussed.

## 2024-01-25 NOTE — Progress Notes (Signed)
Subjective:    Patient ID: Clarence Dawson, male    DOB: 1946/02/03, 78 y.o.   MRN: [id number]  Patient Care Team: Carylon Perches, MD as PCP - General (Internal Medicine) Marinus Maw, MD as PCP - Cardiology (Cardiology) Marinus Maw, MD as PCP - Electrophysiology (Cardiology) Salena Saner, MD as Consulting Physician (Pulmonary Disease)  Chief Complaint  Patient presents with   Follow-up    Congestion in chest for 4 days.  Does not know the color.    BACKGROUND/INTERVAL:Clarence Dawson is a 78 year old former smoker (19 PY) who presents for follow-up from his most recent visit of 26 October 2023.  At that time he was seen by me and therapy Sharyn Blitz was initiated.  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  Prior issues with ABPA and Aspergillus colonization.  He has also been diagnosed with moderate pulmonary arterial hypertension (group 3), moderate nonobstructive coronary artery disease.  He also has a history of atrial fibrillation on Xarelto and Tikosyn.  He has very complex issues as noted.  He is participating in pulmonary rehab.  He uses chest vest physiotherapy for management of his bronchiectasis.  COPD/asthma management is maximized .  He was last seen on 17 December 2023.  Placed on minocycline for stenotrophomonas infection (on Tikosyn and cannot have QT prolonging medications such as Levaquin and sulfa medications increase Tikosyn level).  Recently treated for acute recurrent pansinusitis by urgent care.  HPI Discussed the use of AI scribe software for clinical note transcription with the patient, who gave verbal consent to proceed.  History of Present Illness   The patient, with a very complex medical history including severe COPD, bronchiectasis, moderate persistent asthma, chronic respiratory failure, pulmonary hypertension, and obstructive sleep apnea, presents with ongoing  respiratory symptoms. He reports a recent increase in sputum production, which is slightly greenish in color. He also notes occasional blood in the sputum, which he believes is secondary to sinus drainage. He has been managing these symptoms with amoxicillin, obtained from an urgent care center.  Previously he thought he had frank hemoptysis however it turns out that this was actually epistaxis with posterior drip.  Despite these issues he feels that these are improving with the antibiotic therapy.  The patient has recently completed a course of minocycline, which was well-tolerated, but he did not notice a significant change in his respiratory symptoms during this treatment.  Recall he has Stenotrophomonas, Bactrim and Levaquin the only mainstay antibiotics for this organism are contraindicated with the use of Tikosyn which he is on for atrial fibrillation.  Minocycline has recently been identified as a alternative.  He also receives Dupixent injections every other week for his severe persistent asthma. He reports that these injections initially clear his symptoms, but he notices an increase in respiratory secretions and "crud" in the week leading up to his next injection.  The patient also mentions a history of sinus issues, with daily flushing of "big clots of crap." He has been managing this with amoxicillin. He reports no significant changes in his baseline health status. He has been experiencing some difficulty remembering to take his medications, but he has developed a system to help him keep track.   He does not endorse any other symptoms.  He has been compliant with his vest physiotherapy and his BiPAP for his severe obstructive sleep apnea.  Review of Systems A 10 point review of systems was performed and  it is as noted above otherwise negative.   Patient Active Problem List   Diagnosis Date Noted   Chronic respiratory failure with hypoxia (HCC) 06/12/2023   Hypercoagulable state due to  persistent atrial fibrillation (HCC) 01/02/2020   Chronic diastolic heart failure (HCC) 11/22/2019   Paroxysmal atrial fibrillation (HCC) 09/22/2019   Atrial fibrillation with RVR (HCC)    Lobar pneumonia (HCC) 09/21/2019   Acute respiratory failure with hypoxia (HCC) 09/21/2019   Acute on chronic respiratory failure (HCC) 09/18/2019   Severe sepsis (HCC)    Elevated lactic acid level    GERD (gastroesophageal reflux disease)    Chronic bronchitis (HCC) 02/09/2019   CAP (community acquired pneumonia) 03/03/2018   Primary localized osteoarthritis of right knee 08/11/2017   Asthma with acute exacerbation 12/08/2016   Chest pain 10/20/2016   HTN (hypertension) 10/20/2016   Morbid obesity due to excess calories (HCC) 06/24/2016   Severe persistent asthma 02/12/2016   Upper airway cough syndrome 01/24/2016   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   Persistent atrial fibrillation    Syncope    Hyperthyroidism    Hyperlipidemia    RBBB (right bundle branch block)    Tricuspid valve disorder    Aortic valve disorder    Carotid artery stenosis    Type 2 diabetes mellitus (HCC)    Pacemaker    OSA (obstructive sleep apnea)    Arthritis    Sinoatrial node dysfunction (HCC)    PVC's (premature ventricular contractions) 07/17/2011   Essential hypertension, benign 01/24/2011   Premature ventricular contractions 01/24/2011   PPM-[facility] 01/24/2011    Social History   Tobacco Use   Smoking status: Former    Current packs/day: 0.00    Average packs/day: 3.0 packs/day for 26.0 years (77.9 ttl pk-yrs)    Types: Cigarettes    Start date: 02/20/1958    Quit date: 02/12/1984    Years since quitting: 39.9   Smokeless tobacco: Former    Types: Chew   Tobacco comments:    Former smoker 08/30/2021  Substance Use Topics   Alcohol use: Yes    Alcohol/week: 2.0 - 4.0 standard drinks of alcohol    Types: 1 - 2 Glasses of wine, 1 - 2 Cans of beer per week    Comment: 1 glass of wine or beer 3 times a week  12/26/22    Allergies  Allergen Reactions   Food Anaphylaxis and Shortness Of Breath    TREE NUTS   Iodinated Contrast Media Hives and Shortness Of Breath    Patient states hives to throat closing. (01/15/17: patient states this reaction was "about 20 years ago" with possibly an IVP.  He now says high doses of prednisone "throw me into AFib."  He has tolerated CT arthrograms with Benadrly 50mg  PO one hour before injection.  Donell Sievert, RN)   Shellfish Allergy Anaphylaxis and Shortness Of Breath    To shellfish, crabs.  Makes him feel like "things are crawling all over" me.  Denies airway issues with these foods.  Donell Sievert, RN 01/15/17)   Goat-Derived Products Hives    GOAT CHEESE    Prednisone Palpitations    PRECIPITATES A-FIB   Diclofenac Sodium Other (See Comments)    Hives, "buggy feeling all over"   Metformin And Related Diarrhea    High doses at once   Vancomycin Anxiety    Red man syndrome   Voltaren [Diclofenac Sodium] Other (See Comments)    Feels like things are crawling on him  Current Meds  Medication Sig   acetaminophen (TYLENOL) 500 MG tablet Take 1,000 mg by mouth 3 (three) times daily.   albuterol (PROVENTIL) (2.5 MG/3ML) 0.083% nebulizer solution Take 3 mLs (2.5 mg total) by nebulization every 6 (six) hours as needed for wheezing or shortness of breath.   albuterol (VENTOLIN HFA) 108 (90 Base) MCG/ACT inhaler Inhale 2 puffs into the lungs every 4 (four) hours as needed for shortness of breath (only if you can't catch your breath/ asthma).   amoxicillin-clavulanate (AUGMENTIN) 875-125 MG tablet Take 1 tablet by mouth 2 (two) times daily for 10 days.   atorvastatin (LIPITOR) 40 MG tablet Take 1 tablet (40 mg total) by mouth at bedtime.   bisoprolol (ZEBETA) 5 MG tablet TAKE 1 AND 1/2 TABLET BY MOUTH TWICE DAILY.   BREZTRI AEROSPHERE 160-9-4.8 MCG/ACT AERO INHALE 2 PUFFS INTO THE LUNGS TWICE DAILY.   Carboxymethylcellul-Glycerin (LUBRICATING EYE DROPS OP) Place 1  drop into the right eye 2 (two) times a week. Clear eyes   diltiazem (CARDIZEM CD) 120 MG 24 hr capsule TAKE (1) CAPSULE BY MOUTH DAILY, MAY TAKE A EXTRA CAPSULE DAILY FOR BREAKTHROUGH AFIB.   dofetilide (TIKOSYN) 250 MCG capsule Take 1 capsule (250 mcg total) by mouth 2 (two) times daily.   Dupilumab (DUPIXENT) 300 MG/2ML SOAJ Inject 300 mg into the skin every 14 (fourteen) days.   Ensifentrine (OHTUVAYRE) 3 MG/2.5ML SUSP Take 3 mg by nebulization 2 (two) times daily.   EPINEPHrine 0.3 mg/0.3 mL IJ SOAJ injection Inject 0.3 mg into the muscle as needed for anaphylaxis.   fluticasone (FLONASE) 50 MCG/ACT nasal spray Place 2 sprays into both nostrils daily.   furosemide (LASIX) 40 MG tablet Take 1 tablet (40 mg total) by mouth 2 (two) times daily.   glipiZIDE (GLUCOTROL XL) 5 MG 24 hr tablet Take 5 mg by mouth daily before breakfast.   hydrocortisone cream 0.5 % Apply 1 Application topically daily as needed for itching.   JARDIANCE 25 MG TABS tablet Take 25 mg by mouth daily before breakfast.   levothyroxine (SYNTHROID) 150 MCG tablet Take 150 mcg by mouth daily before breakfast.   losartan (COZAAR) 50 MG tablet Take 50 mg by mouth at bedtime.   metFORMIN (GLUCOPHAGE-XR) 500 MG 24 hr tablet Take 500 mg by mouth in the morning and at bedtime.   Multiple Vitamin (MULTIVITAMIN) tablet Take 1 tablet by mouth daily.   omeprazole (PRILOSEC OTC) 20 MG tablet Take 20 mg by mouth every morning.   PRESCRIPTION MEDICATION Thumper vest   Respiratory Therapy Supplies (FLUTTER) DEVI Use as directed   rivaroxaban (XARELTO) 20 MG TABS tablet Take 20 mg by mouth at bedtime.   sodium chloride (OCEAN) 0.65 % SOLN nasal spray Place 1 spray into both nostrils as needed for congestion.    Immunization History  Administered Date(s) Administered   Fluad Quad(high Dose 65+) 09/12/2019, 10/15/2022   Influenza Split 11/14/2015, 10/11/2018   Influenza, High Dose Seasonal PF 09/28/2017   Influenza-Unspecified  11/02/2016, 09/03/2020, 10/03/2021, 10/23/2023   Moderna SARS-COV2 Booster Vaccination 08/20/2020, 03/04/2021   Moderna Sars-Covid-2 Vaccination 02/15/2020, 03/14/2020   Pneumococcal Conjugate-13 09/28/2015   Tdap 11/28/2019        Objective:     BP (!) 160/60 (BP Location: Right Arm, Patient Position: Sitting, Cuff Size: Normal)   Pulse 60   Temp 98.5 F (36.9 C) (Oral)   Ht 5\' 9"  (1.753 m)   Wt 225 lb 9.6 oz (102.3 kg)   SpO2 91%  BMI 33.32 kg/m   SpO2: 91 % O2 Device: Nasal cannula O2 Flow Rate (L/min): 4 L/min O2 Type: Pulse O2  GENERAL: Well-developed, obese patient in no acute distress.  He is comfortable on nasal cannula O2.  He is fully ambulatory. HEAD: Normocephalic, atraumatic.  EYES: Pupils equal, round, reactive to light.  No scleral icterus.  MOUTH:   Upper and lower dentures.  Oral mucosa moist.  No thrush. NECK: Supple. No thyromegaly. Trachea midline. No JVD.  No adenopathy. PULMONARY: Good air entry bilaterally air entry symmetrical throughout.  Non labored, there are rare wheezes and rhonchi throughout.   CARDIOVASCULAR: S1 and S2.  Regular rate and rhythm, NSR, rate 62.  Grade 2/6 mitral regurgitation murmur, grade 3/6 aortic stenosis murmur. Pacemaker on left. GASTROINTESTINAL: Obese, otherwise benign. MUSCULOSKELETAL: No joint deformity, no clubbing, no edema.  NEUROLOGIC: No focal deficits, speech is fluent.  No gait disturbance noted.  Awake, alert and oriented. SKIN: Intact,warm,dry. PSYCH: Mood and behavior appropriate.     Assessment & Plan:     ICD-10-CM   1. Asthma-COPD overlap syndrome (HCC) - SEVERE  J44.89    Severe persistent asthma Severe COPD    2. Chronic respiratory failure with hypoxia (HCC)  J96.11     3. Bronchiectasis with acute exacerbation (HCC)  J47.1     4. Pulmonary HTN (HCC)  I27.20     5. OSA (obstructive sleep apnea)  G47.33      Discussion:    Chronic Obstructive Pulmonary Disease (COPD) Severe COPD with  ongoing symptoms of productive cough and occasional hemoptysis. Reports greenish sputum and occasional blood from sinuses, likely due to post-nasal drip. Currently on amoxicillin and completed a course of minocycline without issues. Experiences increased symptoms towards the end of his bi-weekly Dupixent cycle, suggesting a need for more frequent dosing. Discussed the possibility of weekly Dupixent dosing to manage symptoms more effectively. - Continue amoxicillin - Research possibility of weekly Dupixent dosing - Follow-up in 6-8 weeks  Moderate Persistent Asthma Moderate persistent asthma managed with Dupixent. Reports symptom relief shortly after Dupixent administration but increased symptoms towards the end of the dosing cycle. Previous issues with Solair noted. Discussed the possibility of weekly Dupixent dosing to manage symptoms more effectively. - Research possibility of weekly Dupixent dosing - Follow-up in 6-8 weeks  Bronchiectasis Chronic bronchiectasis with persistent productive cough. Reports clearing large clots from sinuses, indicating ongoing infection or inflammation. Has been on multiple antibiotics, including minocycline and amoxicillin. Discussed the limited antibiotic options due to stenotrophomonas infection. - Continue current antibiotic regimen - Monitor symptoms and adjust treatment as needed  Chronic Respiratory Failure Chronic respiratory failure secondary to severe COPD and bronchiectasis. On long-term oxygen therapy and reports well-managed symptoms. - Continue current management  Pulmonary Hypertension Pulmonary hypertension secondary to chronic lung disease. No new symptoms reported. - Continue current management  Obstructive Sleep Apnea Obstructive sleep apnea managed with CPAP. No new issues reported. - Continue CPAP therapy  Follow-up - Follow-up in 6-8 weeks.       Advised if symptoms do not improve or worsen, to please contact office for sooner  follow up or seek emergency care.    I spent 40 minutes of dedicated to the care of this patient on the date of this encounter to include pre-visit review of records, face-to-face time with the patient discussing conditions above, post visit ordering of testing, clinical documentation with the electronic health record, making appropriate referrals as documented, and communicating necessary findings to members of  the patients care team.     Gailen Shelter, MD Advanced Bronchoscopy PCCM [facility]    *This note was generated using voice recognition software/Dragon and/or AI transcription program.  Despite best efforts to proofread, errors can occur which can change the meaning. Any transcriptional errors that result from this process are unintentional and may not be fully corrected at the time of dictation.

## 2024-02-02 ENCOUNTER — Other Ambulatory Visit (HOSPITAL_COMMUNITY): Payer: Self-pay

## 2024-02-02 ENCOUNTER — Other Ambulatory Visit: Payer: Self-pay

## 2024-02-02 NOTE — Progress Notes (Signed)
Specialty Pharmacy Refill Coordination Note  Clarence Dawson is a 78 y.o. male contacted today regarding refills of specialty medication(s) Dupilumab (Dupixent)   Patient requested [name] at [facility] at [place] date: 02/08/24   Medication will be filled on 02/05/24.

## 2024-02-05 ENCOUNTER — Other Ambulatory Visit: Payer: Self-pay

## 2024-02-12 ENCOUNTER — Ambulatory Visit: Payer: Medicare Other

## 2024-02-12 DIAGNOSIS — I495 Sick sinus syndrome: Secondary | ICD-10-CM

## 2024-02-13 LAB — CUP PACEART REMOTE DEVICE CHECK
Battery Remaining Longevity: 95 mo
Battery Remaining Percentage: 88 %
Battery Voltage: 2.99 V
Brady Statistic AP VP Percent: 1 %
Brady Statistic AP VS Percent: 91 %
Brady Statistic AS VP Percent: 1 %
Brady Statistic AS VS Percent: 6.5 %
Brady Statistic RA Percent Paced: 89 %
Brady Statistic RV Percent Paced: 1 %
Date Time Interrogation Session: 20250214020012
Implantable Lead Connection Status: 753985
Implantable Lead Connection Status: 753985
Implantable Lead Implant Date: 20051106
Implantable Lead Implant Date: 20051106
Implantable Lead Location: 753859
Implantable Lead Location: 753860
Implantable Pulse Generator Implant Date: 20230710
Lead Channel Impedance Value: 350 Ohm
Lead Channel Impedance Value: 660 Ohm
Lead Channel Pacing Threshold Amplitude: 0.75 V
Lead Channel Pacing Threshold Amplitude: 1 V
Lead Channel Pacing Threshold Pulse Width: 0.5 ms
Lead Channel Pacing Threshold Pulse Width: 0.5 ms
Lead Channel Sensing Intrinsic Amplitude: 12 mV
Lead Channel Sensing Intrinsic Amplitude: 3.2 mV
Lead Channel Setting Pacing Amplitude: 2 V
Lead Channel Setting Pacing Amplitude: 2.5 V
Lead Channel Setting Pacing Pulse Width: 0.5 ms
Lead Channel Setting Sensing Sensitivity: 2 mV
Pulse Gen Model: 2272
Pulse Gen Serial Number: 8099041

## 2024-02-14 ENCOUNTER — Encounter: Payer: Self-pay | Admitting: Internal Medicine

## 2024-02-16 ENCOUNTER — Encounter (HOSPITAL_COMMUNITY): Payer: Self-pay

## 2024-02-16 ENCOUNTER — Ambulatory Visit (HOSPITAL_COMMUNITY)
Admission: RE | Admit: 2024-02-16 | Discharge: 2024-02-16 | Disposition: A | Discharge status: Home / Self Care | Payer: Medicare Other | Source: Ambulatory Visit | Attending: Cardiology | Admitting: Cardiology

## 2024-02-16 DIAGNOSIS — I5032 Chronic diastolic (congestive) heart failure: Secondary | ICD-10-CM | POA: Diagnosis present

## 2024-02-16 LAB — HEPATIC FUNCTION PANEL
ALT: 21 U/L (ref 0–44)
AST: 20 U/L (ref 15–41)
Albumin: 3.6 g/dL (ref 3.5–5.0)
Alkaline Phosphatase: 62 U/L (ref 38–126)
Bilirubin, Direct: 0.2 mg/dL (ref 0.0–0.2)
Indirect Bilirubin: 0.6 mg/dL (ref 0.3–0.9)
Total Bilirubin: 0.8 mg/dL (ref 0.0–1.2)
Total Protein: 7.2 g/dL (ref 6.5–8.1)

## 2024-02-16 LAB — LIPID PANEL
Cholesterol: 184 mg/dL (ref 0–200)
HDL: 40 mg/dL — ABNORMAL LOW (ref >40)
LDL Cholesterol: 109 mg/dL — ABNORMAL HIGH (ref 0–99)
Total CHOL/HDL Ratio: 4.6 {ratio}
Triglycerides: 176 mg/dL — ABNORMAL HIGH (ref <150)
VLDL: 35 mg/dL (ref 0–40)

## 2024-02-16 LAB — MAGNESIUM: Magnesium: 2.4 mg/dL (ref 1.7–2.4)

## 2024-02-17 ENCOUNTER — Telehealth (HOSPITAL_COMMUNITY): Payer: Self-pay

## 2024-02-17 DIAGNOSIS — I5032 Chronic diastolic (congestive) heart failure: Secondary | ICD-10-CM

## 2024-02-17 MED ORDER — ATORVASTATIN CALCIUM 40 MG PO TABS: 40.0000 mg | ORAL_TABLET | Freq: Every day | ORAL | 3 refills | Status: DC

## 2024-02-17 NOTE — Telephone Encounter (Addendum)
Pt aware, agreeable, and verbalized understanding Rx sent and labs ordered and schedule  ----- Message from Marca Ancona sent at 02/16/2024 10:20 PM EST ----- Needs to go back to atorvastatin 40 mg daily, repeat lipids/LFTs in 2 months.

## 2024-02-25 ENCOUNTER — Ambulatory Visit (HOSPITAL_COMMUNITY)
Admission: RE | Admit: 2024-02-25 | Discharge: 2024-02-25 | Disposition: A | Discharge status: Home / Self Care | Payer: Medicare Other | Source: Ambulatory Visit | Attending: Physician Assistant | Admitting: Physician Assistant

## 2024-02-25 VITALS — BP 118/70 | HR 86 | Ht 69.0 in | Wt 226.8 lb

## 2024-02-25 DIAGNOSIS — E119 Type 2 diabetes mellitus without complications: Secondary | ICD-10-CM | POA: Insufficient documentation

## 2024-02-25 DIAGNOSIS — Z7901 Long term (current) use of anticoagulants: Secondary | ICD-10-CM | POA: Diagnosis not present

## 2024-02-25 DIAGNOSIS — Z7984 Long term (current) use of oral hypoglycemic drugs: Secondary | ICD-10-CM | POA: Diagnosis not present

## 2024-02-25 DIAGNOSIS — I4819 Other persistent atrial fibrillation: Secondary | ICD-10-CM

## 2024-02-25 DIAGNOSIS — I5032 Chronic diastolic (congestive) heart failure: Secondary | ICD-10-CM | POA: Insufficient documentation

## 2024-02-25 DIAGNOSIS — Z950 Presence of cardiac pacemaker: Secondary | ICD-10-CM | POA: Insufficient documentation

## 2024-02-25 DIAGNOSIS — J4489 Other specified chronic obstructive pulmonary disease: Secondary | ICD-10-CM | POA: Diagnosis not present

## 2024-02-25 DIAGNOSIS — Z79899 Other long term (current) drug therapy: Secondary | ICD-10-CM

## 2024-02-25 DIAGNOSIS — K219 Gastro-esophageal reflux disease without esophagitis: Secondary | ICD-10-CM | POA: Diagnosis not present

## 2024-02-25 DIAGNOSIS — Z5181 Encounter for therapeutic drug level monitoring: Secondary | ICD-10-CM

## 2024-02-25 DIAGNOSIS — I2510 Atherosclerotic heart disease of native coronary artery without angina pectoris: Secondary | ICD-10-CM | POA: Diagnosis not present

## 2024-02-25 DIAGNOSIS — I110 Hypertensive heart disease with heart failure: Secondary | ICD-10-CM | POA: Insufficient documentation

## 2024-02-25 DIAGNOSIS — I495 Sick sinus syndrome: Secondary | ICD-10-CM | POA: Insufficient documentation

## 2024-02-25 DIAGNOSIS — D6869 Other thrombophilia: Secondary | ICD-10-CM

## 2024-02-25 NOTE — Progress Notes (Signed)
 Primary Care Physician: Carylon Perches, MD Primary Electrophysiologist: Dr Ladona Ridgel Referring Physician: Dr Johney [employer]: Dr Verl Blalock is a 78 y.o. male with a history of SSS s/p PPM, atrial fibrillation, chronic diastolic CHF, HTN, COPD, DM, and GERD who presents for follow up in the [facility]. Patient was recently admitted with elevated temp and AMS and was diagnosed with severe sepsis 2/2 pneumonia. During his admission, he developed afib with RVR. He was given a bolus of flecainide which converted him to atrial flutter. Patient was fairly asymptomatic. He is on Xarelto for a CHADS2VASC score of 3.  The device clinic received an alert for an ongoing afib episode starting 04/09/21. He did have two alcohol drinks the night before and was also being treated for an URI. COVID test was negative. He was back in SR before follow up.  Patient was noted to be out of rhythm by the device clinic on 08/23/21 and underwent DCCV on 09/05/21. He had recurrent afib and underwent another DCCV on 08/06/22. The device clinic received an alert for an ongoing afib episode starting 12/07/22.   Patient is s/p dofetilide loading 1/16-1/19/24. He converted with the medication and did not require DCCV.   Patient returns for follow up for atrial fibrillation and dofetilide monitoring. Patient reports that he feels reasonably well. He did notice his heart rate was ~ 100 bpm on his BP machine yesterday. He is in afib today. Device interrogation on 2/14 showed SR with a <1% burden. Although some undersensing was detected.   Today, he denies symptoms of palpitations, chest pain, orthopnea, PND, lower extremity edema, dizziness, presyncope, syncope, snoring, daytime somnolence, bleeding, or neurologic sequela. The patient is tolerating medications without difficulties and is otherwise without complaint today.    Atrial Fibrillation Risk Factors:  he does not have symptoms or diagnosis of  sleep apnea. he does not have a history of rheumatic fever. he does not have a history of alcohol use. The patient does not have a history of early familial atrial fibrillation or other arrhythmias.   Atrial Fibrillation Management history:  Previous antiarrhythmic drugs: flecainide, dofetilide  Previous cardioversions: 11/2016, 09/05/21 Previous ablations: none Anticoagulation history: Xarelto   Past Medical History:  Diagnosis Date   Allergic rhinitis    Aortic valve disorder    Asthma    since childhood- seasonal allergies induced   Cancer (HCC)    Skin cancer- squamous, basal   Carotid artery stenosis    Essential hypertension    Full dentures    GERD (gastroesophageal reflux disease)    H/O hiatal hernia    Hemorrhage of rectum    Hyperlipidemia    Hypothyroidism    Male circumcision    OSA (obstructive sleep apnea)    Osteoarthritis    Pacemaker    Oct 2005 in [place].   PAF (paroxysmal atrial fibrillation) (HCC)    Pneumonia    "several Times" 2015 last time   Primary localized osteoarthritis of right knee 08/11/2017   RBBB (right bundle branch block)    Sinoatrial node dysfunction (HCC)    Syncope    Tricuspid valve disorder    Type 2 diabetes mellitus (HCC)    Type II    Current Outpatient Medications  Medication Sig Dispense Refill   acetaminophen (TYLENOL) 500 MG tablet Take 1,000 mg by mouth 3 (three) times daily.     albuterol (PROVENTIL) (2.5 MG/3ML) 0.083% nebulizer solution Take 3 mLs (2.5  mg total) by nebulization every 6 (six) hours as needed for wheezing or shortness of breath. 90 mL 6   albuterol (VENTOLIN HFA) 108 (90 Base) MCG/ACT inhaler Inhale 2 puffs into the lungs every 4 (four) hours as needed for shortness of breath (only if you can't catch your breath/ asthma). 18 g 5   atorvastatin (LIPITOR) 40 MG tablet Take 1 tablet (40 mg total) by mouth at bedtime. 90 tablet 3   bisoprolol (ZEBETA) 5 MG tablet TAKE 1 AND 1/2 TABLET BY MOUTH TWICE  DAILY. 90 tablet 3   BREZTRI AEROSPHERE 160-9-4.8 MCG/ACT AERO INHALE 2 PUFFS INTO THE LUNGS TWICE DAILY. 10.7 g 5   diltiazem (CARDIZEM CD) 120 MG 24 hr capsule TAKE (1) CAPSULE BY MOUTH DAILY, MAY TAKE A EXTRA CAPSULE DAILY FOR BREAKTHROUGH AFIB. 180 capsule 3   dofetilide (TIKOSYN) 250 MCG capsule Take 1 capsule (250 mcg total) by mouth 2 (two) times daily. 60 capsule 3   Dupilumab (DUPIXENT) 300 MG/2ML SOAJ Inject 300 mg into the skin every 14 (fourteen) days. 12 mL 1   Ensifentrine (OHTUVAYRE) 3 MG/2.5ML SUSP Take 3 mg by nebulization 2 (two) times daily.     fluticasone (FLONASE) 50 MCG/ACT nasal spray Place 2 sprays into both nostrils daily. 16 g 6   furosemide (LASIX) 40 MG tablet Take 1 tablet (40 mg total) by mouth 2 (two) times daily. 60 tablet 3   glipiZIDE (GLUCOTROL XL) 5 MG 24 hr tablet Take 5 mg by mouth daily before breakfast.     JARDIANCE 25 MG TABS tablet Take 25 mg by mouth daily before breakfast.     levothyroxine (SYNTHROID) 150 MCG tablet Take 150 mcg by mouth daily before breakfast.     losartan (COZAAR) 50 MG tablet Take 50 mg by mouth at bedtime.     metFORMIN (GLUCOPHAGE-XR) 500 MG 24 hr tablet Take 500 mg by mouth in the morning and at bedtime.     Multiple Vitamin (MULTIVITAMIN) tablet Take 1 tablet by mouth daily.     omeprazole (PRILOSEC OTC) 20 MG tablet Take 20 mg by mouth every morning.     PRESCRIPTION MEDICATION Thumper vest     Respiratory Therapy Supplies (FLUTTER) DEVI Use as directed 1 each 0   rivaroxaban (XARELTO) 20 MG TABS tablet Take 20 mg by mouth at bedtime.     sodium chloride (OCEAN) 0.65 % SOLN nasal spray Place 1 spray into both nostrils as needed for congestion.     Carboxymethylcellul-Glycerin (LUBRICATING EYE DROPS OP) Place 1 drop into the right eye 2 (two) times a week. Clear eyes     EPINEPHrine 0.3 mg/0.3 mL IJ SOAJ injection Inject 0.3 mg into the muscle as needed for anaphylaxis. 1 each 5   hydrocortisone cream 0.5 % Apply 1  Application topically daily as needed for itching.     No current facility-administered medications for this encounter.    ROS- All systems are reviewed and negative except as per the HPI above.  Physical Exam: Vitals:   02/25/24 0927  BP: 118/70  Pulse: 86  Weight: 102.9 kg  Height: 5\' 9"  (1.753 m)    GEN: Well nourished, well developed in no acute distress NECK: No JVD; No carotid bruits CARDIAC: Irregularly irregular rate and rhythm, no murmurs, rubs, gallops RESPIRATORY:  coarse rhonchi bilaterally.  ABDOMEN: Soft, non-tender, non-distended EXTREMITIES:  No edema; No deformity    Wt Readings from Last 3 Encounters:  02/25/24 102.9 kg  01/25/24 102.3 kg  12/22/23  102.4 kg    EKG today demonstrates Afib, RBBB Vent. rate 86 BPM PR interval * ms QRS duration 154 ms QT/QTcB 450/538 ms   Echo 07/03/23 demonstrated   1. Left ventricular ejection fraction, by estimation, is 55 to 60%. The  left ventricle has normal function. The left ventricle has no regional  wall motion abnormalities. Left ventricular diastolic parameters are  consistent with Grade II diastolic dysfunction (pseudonormalization).   2. Right ventricular systolic function is low normal. The right  ventricular size is moderately enlarged. There is severely elevated  pulmonary artery systolic pressure. The estimated right ventricular  systolic pressure is 96.4 mmHg.   3. Left atrial size was severely dilated.   4. Right atrial size was severely dilated.   5. The mitral valve is normal in structure. Mild to moderate mitral valve  regurgitation.   6. The aortic valve is calcified. Aortic valve regurgitation is not  visualized. Mild aortic valve stenosis. Aortic valve mean gradient  measures 13.0 mmHg.   7. Aortic dilatation noted. There is mild dilatation of the aortic root,  measuring 40 mm.   8. The inferior vena cava is dilated in size with <50% respiratory  variability, suggesting right atrial  pressure of 15 mmHg.   Epic records are reviewed at length today   CHA2DS2-VASc Score = 6  The patient's score is based upon: CHF History: 1 HTN History: 1 Diabetes History: 1 Stroke History: 0 Vascular Disease History: 1 Age Score: 2 Gender Score: 0       ASSESSMENT AND PLAN: Persistent Atrial Fibrillation (ICD10:  I48.19) The patient's CHA2DS2-VASc score is 6, indicating a 9.7% annual risk of stroke.   S/p dofetilide admission 12/2022 Patient in afib today but overall very low burden on device interrogation. Will continue present treatment for now.  Continue dofetilide 250 mcg BID Continue Xarelto 20 mg daily Continue diltiazem 120 mg daily Continue bisoprolol 7.5 mg BID  Secondary Hypercoagulable State (ICD10:  D68.69) The patient is at significant risk for stroke/thromboembolism based upon his CHA2DS2-VASc Score of 6.  Continue Rivaroxaban (Xarelto). No bleeding issues.  High Risk Medication Monitoring (ICD 10: Z79.899) QT interval on ECG acceptable for dofetilide monitoring. Check bmet/mag today. Recent bmet and mag reviewed.     SSS S/p PPM, followed by Dr Ladona Ridgel and the device clinic  HTN Stable on current regimen  CAD LHC 07/2023 non obstructive CAD No anginal symptoms Followed by Dr Shirlee Latch  Chronic HFpEF EF 55-60% GDMT per [facility] team Fluid status appears stable today   Follow up in the AF clinic in 6 months.    Jorja Loa PA-C [facility] [address] [place], [place] [address] [phone] 02/25/2024 9:41 AM

## 2024-02-29 ENCOUNTER — Other Ambulatory Visit: Payer: Self-pay

## 2024-02-29 ENCOUNTER — Ambulatory Visit: Attending: Internal Medicine

## 2024-02-29 DIAGNOSIS — I4819 Other persistent atrial fibrillation: Secondary | ICD-10-CM

## 2024-02-29 NOTE — Progress Notes (Unsigned)
 Enrolled for [employer] to mail a ZIO XT long term holter monitor to the patients address on file.

## 2024-03-01 ENCOUNTER — Other Ambulatory Visit: Payer: Self-pay

## 2024-03-02 DIAGNOSIS — I4819 Other persistent atrial fibrillation: Secondary | ICD-10-CM | POA: Diagnosis not present

## 2024-03-03 ENCOUNTER — Other Ambulatory Visit: Payer: Self-pay

## 2024-03-03 ENCOUNTER — Telehealth: Payer: Self-pay | Admitting: Internal Medicine

## 2024-03-03 ENCOUNTER — Other Ambulatory Visit (HOSPITAL_COMMUNITY): Payer: Self-pay

## 2024-03-03 ENCOUNTER — Encounter: Payer: Self-pay | Admitting: Internal Medicine

## 2024-03-03 NOTE — Telephone Encounter (Signed)
 Patient stated he was returning staff call regarding wearing "thumper vest" while wearing a heart monitor.

## 2024-03-03 NOTE — Progress Notes (Signed)
 Specialty Pharmacy Refill Coordination Note  Clarence Dawson is a 78 y.o. male contacted today regarding refills of specialty medication(s) Dupixent.  Patient requested (Patient-Rptd) Pickup at [facility] date: (Patient-Rptd) 03/07/24   Medication will be filled on 03/04/24.

## 2024-03-04 ENCOUNTER — Other Ambulatory Visit: Payer: Self-pay

## 2024-03-07 ENCOUNTER — Other Ambulatory Visit (HOSPITAL_COMMUNITY): Payer: Self-pay

## 2024-03-09 ENCOUNTER — Other Ambulatory Visit
Admission: RE | Admit: 2024-03-09 | Discharge: 2024-03-09 | Disposition: A | Discharge status: Home / Self Care | Source: Ambulatory Visit | Attending: Pulmonary Disease | Admitting: Pulmonary Disease

## 2024-03-09 ENCOUNTER — Encounter: Payer: Self-pay | Admitting: Pulmonary Disease

## 2024-03-09 ENCOUNTER — Ambulatory Visit: Payer: Medicare Other | Admitting: Pulmonary Disease

## 2024-03-09 VITALS — BP 110/56 | HR 60 | Temp 97.1°F | Ht 69.0 in | Wt 224.6 lb

## 2024-03-09 DIAGNOSIS — J471 Bronchiectasis with (acute) exacerbation: Secondary | ICD-10-CM | POA: Diagnosis not present

## 2024-03-09 DIAGNOSIS — A498 Other bacterial infections of unspecified site: Secondary | ICD-10-CM

## 2024-03-09 DIAGNOSIS — I4821 Permanent atrial fibrillation: Secondary | ICD-10-CM | POA: Diagnosis not present

## 2024-03-09 DIAGNOSIS — J9611 Chronic respiratory failure with hypoxia: Secondary | ICD-10-CM | POA: Diagnosis not present

## 2024-03-09 DIAGNOSIS — J441 Chronic obstructive pulmonary disease with (acute) exacerbation: Secondary | ICD-10-CM | POA: Diagnosis not present

## 2024-03-09 LAB — EXPECTORATED SPUTUM ASSESSMENT W GRAM STAIN, RFLX TO RESP C

## 2024-03-09 MED ORDER — MINOCYCLINE HCL 100 MG PO CAPS: 100.0000 mg | ORAL_CAPSULE | Freq: Two times a day (BID) | ORAL | 0 refills | Status: AC

## 2024-03-09 MED ORDER — ALBUTEROL SULFATE (2.5 MG/3ML) 0.083% IN NEBU: 2.5000 mg | INHALATION_SOLUTION | Freq: Four times a day (QID) | RESPIRATORY_TRACT | 6 refills | Status: DC | PRN

## 2024-03-09 NOTE — Progress Notes (Signed)
 Subjective:    Patient ID: Clarence Dawson, male    DOB: 03-08-1946, 78 y.o.   MRN: [id number]  Patient Care Team: Carylon Perches, MD as PCP - General (Internal Medicine) Marinus Maw, MD as PCP - Cardiology (Cardiology) Marinus Maw, MD as PCP - Electrophysiology (Cardiology) Salena Saner, MD as Consulting Physician (Pulmonary Disease)  Chief Complaint  Patient presents with   Follow-up    Wheezing. Cough with tan/brown and thick. Increased SOB with exertion. Sweats.    BACKGROUND/INTERVAL: Clarence Dawson is a very complex 78 year old former smoker (44 PY) who presents for follow-up from his most recent visit of 25 January 2024.  At that time he was using Ohtuvayre.  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  Prior issues with ABPA and Aspergillus colonization.  He has also been diagnosed with moderate pulmonary arterial hypertension (group 3), moderate nonobstructive coronary artery disease.  He also has a history of atrial fibrillation on Xarelto and Tikosyn.  He has very complex issues as noted.  He completed pulmonary rehab.  He uses chest vest physiotherapy for management of his bronchiectasis.  COPD/asthma management is maximized .  On the 17 December 2023 visit he was placed on minocycline for stenotrophomonas infection (on Tikosyn and cannot have QT prolonging medications such as Levaquin and sulfa medications increase Tikosyn level).  He presents with acute symptoms today as noted below  HPI Discussed the use of AI scribe software for clinical note transcription with the patient, who gave verbal consent to proceed.  History of Present Illness   The patient, with chronic obstructive pulmonary disease and severe persistent asthma, presents with episodes of lightheadedness and difficulty breathing.  He experiences episodes of extreme lightheadedness and dizziness, describing it as feeling  like he is going to 'melt down into a puddle.' These episodes are accompanied by difficulty breathing, which takes about ten to fifteen minutes to resolve. During these episodes, his oxygen saturation drops below 82%, prompting a service call to replace his oxygen equipment. The issue was resolved by changing the filter in his oxygen concentrator.  These episodes have coincided with his going back into atrial fibrillation and increased chest congestion productive of tan/brown sputum.  He has not had any fevers, does describe sweats.  Chills.  He uses a nebulizer treatment daily and has albuterol at home for emergency use. He used a dose of albuterol this morning, which helped him breathe deeper with less gurgling.  He is on Ohtuvayre twice a day via nebulizer  He mentions waking up soaking wet a couple of times over the past two weeks, although he denies experiencing fevers or chills. He notes that his hands are often cold, which he believes may affect his oxygen saturation readings.  He has a history of chronic atrial fibrillation and has been monitoring his condition with a personal device that provides single and six-lead readings, confirming episodes of atrial fibrillation.  He is following up with cardiology in this regard.  He has been anticoagulated and has been on Tikosyn though this had to be reduced due to QT prolongation.  He has been noted to have Stenotrophomonas in his sputum previously.     Review of Systems A 10 point review of systems was performed and it is as noted above otherwise negative.   Patient Active Problem List   Diagnosis Date Noted   Encounter for monitoring dofetilide therapy 02/25/2024   Chronic respiratory  failure with hypoxia (HCC) 06/12/2023   Hypercoagulable state due to persistent atrial fibrillation (HCC) 01/02/2020   Chronic diastolic heart failure (HCC) 11/22/2019   Paroxysmal atrial fibrillation (HCC) 09/22/2019   Atrial fibrillation with RVR (HCC)     Lobar pneumonia (HCC) 09/21/2019   Acute respiratory failure with hypoxia (HCC) 09/21/2019   Acute on chronic respiratory failure (HCC) 09/18/2019   Severe sepsis (HCC)    Elevated lactic acid level    GERD (gastroesophageal reflux disease)    Chronic bronchitis (HCC) 02/09/2019   CAP (community acquired pneumonia) 03/03/2018   Primary localized osteoarthritis of right knee 08/11/2017   Asthma with acute exacerbation 12/08/2016   Chest pain 10/20/2016   HTN (hypertension) 10/20/2016   Morbid obesity due to excess calories (HCC) 06/24/2016   Severe persistent asthma 02/12/2016   Upper airway cough syndrome 01/24/2016   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   Persistent atrial fibrillation    Syncope    Hyperthyroidism    Hyperlipidemia    RBBB (right bundle branch block)    Tricuspid valve disorder    Aortic valve disorder    Carotid artery stenosis    Type 2 diabetes mellitus (HCC)    Pacemaker    OSA (obstructive sleep apnea)    Arthritis    Sinoatrial node dysfunction (HCC)    PVC's (premature ventricular contractions) 07/17/2011   Essential hypertension, benign 01/24/2011   Premature ventricular contractions 01/24/2011   PPM-[facility] 01/24/2011    Social History   Tobacco Use   Smoking status: Former    Current packs/day: 0.00    Average packs/day: 3.0 packs/day for 26.0 years (77.9 ttl pk-yrs)    Types: Cigarettes    Start date: 02/20/1958    Quit date: 02/12/1984    Years since quitting: 40.0   Smokeless tobacco: Former    Types: Chew   Tobacco comments:    Former smoker 08/30/2021  Substance Use Topics   Alcohol use: Yes    Alcohol/week: 2.0 - 4.0 standard drinks of alcohol    Types: 1 - 2 Glasses of wine, 1 - 2 Cans of beer per week    Comment: 1 glass of wine or beer 3 times a week 12/26/22    Allergies  Allergen Reactions   Food Anaphylaxis and Shortness Of Breath    TREE NUTS   Iodinated Contrast Media Hives and Shortness Of Breath    Patient states hives to  throat closing. (01/15/17: patient states this reaction was "about 20 years ago" with possibly an IVP.  He now says high doses of prednisone "throw me into AFib."  He has tolerated CT arthrograms with Benadrly 50mg  PO one hour before injection.  Donell Sievert, RN)   Shellfish Allergy Anaphylaxis and Shortness Of Breath    To shellfish, crabs.  Makes him feel like "things are crawling all over" me.  Denies airway issues with these foods.  Donell Sievert, RN 01/15/17)   Goat-Derived Products Hives    GOAT CHEESE    Prednisone Palpitations    PRECIPITATES A-FIB   Diclofenac Sodium Other (See Comments)    Hives, "buggy feeling all over"   Metformin And Related Diarrhea    High doses at once   Vancomycin Anxiety    Red man syndrome   Voltaren [Diclofenac Sodium] Other (See Comments)    Feels like things are crawling on him    Current Meds  Medication Sig   acetaminophen (TYLENOL) 500 MG tablet Take 1,000 mg by mouth 3 (three) times  daily.   albuterol (PROVENTIL) (2.5 MG/3ML) 0.083% nebulizer solution Take 3 mLs (2.5 mg total) by nebulization every 6 (six) hours as needed for wheezing or shortness of breath.   albuterol (VENTOLIN HFA) 108 (90 Base) MCG/ACT inhaler Inhale 2 puffs into the lungs every 4 (four) hours as needed for shortness of breath (only if you can't catch your breath/ asthma).   atorvastatin (LIPITOR) 40 MG tablet Take 1 tablet (40 mg total) by mouth at bedtime.   bisoprolol (ZEBETA) 5 MG tablet TAKE 1 AND 1/2 TABLET BY MOUTH TWICE DAILY.   BREZTRI AEROSPHERE 160-9-4.8 MCG/ACT AERO INHALE 2 PUFFS INTO THE LUNGS TWICE DAILY.   Carboxymethylcellul-Glycerin (LUBRICATING EYE DROPS OP) Place 1 drop into the right eye 2 (two) times a week. Clear eyes   diltiazem (CARDIZEM CD) 120 MG 24 hr capsule TAKE (1) CAPSULE BY MOUTH DAILY, MAY TAKE A EXTRA CAPSULE DAILY FOR BREAKTHROUGH AFIB.   dofetilide (TIKOSYN) 250 MCG capsule Take 1 capsule (250 mcg total) by mouth 2 (two) times daily.    Dupilumab (DUPIXENT) 300 MG/2ML SOAJ Inject 300 mg into the skin every 14 (fourteen) days.   Ensifentrine (OHTUVAYRE) 3 MG/2.5ML SUSP Take 3 mg by nebulization 2 (two) times daily.   EPINEPHrine 0.3 mg/0.3 mL IJ SOAJ injection Inject 0.3 mg into the muscle as needed for anaphylaxis.   fluticasone (FLONASE) 50 MCG/ACT nasal spray Place 2 sprays into both nostrils daily.   furosemide (LASIX) 40 MG tablet Take 1 tablet (40 mg total) by mouth 2 (two) times daily.   glipiZIDE (GLUCOTROL XL) 5 MG 24 hr tablet Take 5 mg by mouth daily before breakfast.   hydrocortisone cream 0.5 % Apply 1 Application topically daily as needed for itching.   JARDIANCE 25 MG TABS tablet Take 25 mg by mouth daily before breakfast.   levothyroxine (SYNTHROID) 150 MCG tablet Take 150 mcg by mouth daily before breakfast.   losartan (COZAAR) 50 MG tablet Take 50 mg by mouth at bedtime.   metFORMIN (GLUCOPHAGE-XR) 500 MG 24 hr tablet Take 500 mg by mouth in the morning and at bedtime.   Multiple Vitamin (MULTIVITAMIN) tablet Take 1 tablet by mouth daily.   omeprazole (PRILOSEC OTC) 20 MG tablet Take 20 mg by mouth every morning.   PRESCRIPTION MEDICATION Thumper vest   Respiratory Therapy Supplies (FLUTTER) DEVI Use as directed   rivaroxaban (XARELTO) 20 MG TABS tablet Take 20 mg by mouth at bedtime.   sodium chloride (OCEAN) 0.65 % SOLN nasal spray Place 1 spray into both nostrils as needed for congestion.    Immunization History  Administered Date(s) Administered   Fluad Quad(high Dose 65+) 09/12/2019, 10/15/2022   Influenza Split 11/14/2015, 10/11/2018   Influenza, High Dose Seasonal PF 09/28/2017   Influenza-Unspecified 11/02/2016, 09/03/2020, 10/03/2021, 10/23/2023   Moderna SARS-COV2 Booster Vaccination 08/20/2020, 03/04/2021   Moderna Sars-Covid-2 Vaccination 02/15/2020, 03/14/2020   Pneumococcal Conjugate-13 09/28/2015   Tdap 11/28/2019        Objective:     BP (!) 110/56 (BP Location: Right Arm, Cuff  Size: Large)   Pulse 60   Temp (!) 97.1 F (36.2 C)   Ht 5\' 9"  (1.753 m)   Wt 224 lb 9.6 oz (101.9 kg)   SpO2 90%   BMI 33.17 kg/m   SpO2: 90 % O2 Device: Nasal cannula O2 Flow Rate (L/min): 5 L/min O2 Type: Continuous O2  GENERAL: Well-developed, obese patient in no acute distress.  He is comfortable on nasal cannula O2.  He is  fully ambulatory. HEAD: Normocephalic, atraumatic.  EYES: Pupils equal, round, reactive to light.  No scleral icterus.  MOUTH:   Upper and lower dentures.  Oral mucosa moist.  No thrush. NECK: Supple. No thyromegaly. Trachea midline. No JVD.  No adenopathy. PULMONARY: Good air entry bilaterally air entry symmetrical throughout.  Diffuse rhonchi throughout.  No wheezing.  CARDIOVASCULAR: S1 and S2.  Regular rate and rhythm, A-fib rate 60.  Grade 2/6 mitral regurgitation murmur, grade 3/6 aortic stenosis murmur. Pacemaker on left. GASTROINTESTINAL: Obese, otherwise benign. MUSCULOSKELETAL: No joint deformity, no clubbing, no edema.  NEUROLOGIC: No focal deficits, speech is fluent.  No gait disturbance noted.  Awake, alert and oriented. SKIN: Intact,warm,dry. PSYCH: Mood and behavior appropriate.  Patient received albuterol 2.5 mg via nebulization, after nebulization therapy oxygen saturations increased to 95% and patient was able to expectorate easily clearing his chest.    Sputum for C&S collected.  Assessment & Plan:     ICD-10-CM   1. COPD with acute exacerbation (HCC)  J44.1 DG Chest 2 View    2. Bronchiectasis with acute exacerbation (HCC)  J47.1 Culture, sputum-assessment    DG Chest 2 View    3. Chronic respiratory failure with hypoxia (HCC)  J96.11     4. Permanent atrial fibrillation (HCC)  I48.21     5. Infection due to Stenotrophomonas maltophilia  A49.8      Orders Placed This Encounter  Procedures   Culture, sputum-assessment    Standing Status:   Future    Expiration Date:   03/09/2025   DG Chest 2 View    Standing Status:    Future    Expected Date:   03/09/2024    Expiration Date:   03/09/2025    Reason for Exam (SYMPTOM  OR DIAGNOSIS REQUIRED):   COPD exacerbation, R/O pneumonia    Preferred imaging location?:   [facility]   Meds ordered this encounter  Medications   minocycline (MINOCIN) 100 MG capsule    Sig: Take 1 capsule (100 mg total) by mouth 2 (two) times daily for 10 days.    Dispense:  20 capsule    Refill:  0   albuterol (PROVENTIL) (2.5 MG/3ML) 0.083% nebulizer solution    Sig: Take 3 mLs (2.5 mg total) by nebulization every 6 (six) hours as needed for wheezing or shortness of breath.    Dispense:  90 mL    Refill:  6   Discussion:    Chronic Obstructive Pulmonary Disease (COPD) Experiencing severe dyspnea and hypoxemia with oxygen saturation dropping to 82%, improving to 95% post-nebulization. Reports lightheadedness and dizziness during episodes, resolving after 10-15 minutes.  Recommend that albuterol be administered before Ohtuvayre to enhance bronchodilation and improve oxygenation. - Administer albuterol nebulization before Ohtuvayre twice a day. - Order chest x-ray to assess pulmonary status: Patient prefers [facility] radiology, order sent.  Severe Persistent Asthma Contributing to respiratory symptoms. Uses albuterol as a rescue inhaler with improvement post-nebulization.  Recommend albuterol nebulization twice a day prior to [facility], this should maximize bronchodilation and ensure deeper penetration of subsequent medications. - Use albuterol nebulizer before Ohtuvayre to enhance bronchodilation.  Bronchiectasis May contribute to chronic respiratory symptoms and exacerbations. Current management is well-controlled. - Continue current management and monitor for exacerbations. - Chest x-ray today, sputum culture collected and sent for analysis. - Re-treat with minocycline (cannot use Levaquin or Bactrim DS due to concomitant use of Tikosyn)  Chronic Atrial  Fibrillation Experiences lightheadedness and dizziness. Uses a personal monitor  for AFib episodes and is on Tikosyn for rhythm control to reduce episodes. - Continue Tikosyn for atrial fibrillation management. - Monitor for symptoms of AFib and adjust treatment as necessary.  Follow-up Requires ongoing monitoring and management of chronic conditions. - Schedule follow-up appointment to review chest x-ray results and assess treatment efficacy.     Advised if symptoms do not improve or worsen, to please contact office for sooner follow up or seek emergency care.    I spent 42 minutes of dedicated to the care of this patient on the date of this encounter to include pre-visit review of records, face-to-face time with the patient discussing conditions above, post visit ordering of testing, clinical documentation with the electronic health record, making appropriate referrals as documented, and communicating necessary findings to members of the patients care team.   C. Danice Goltz, MD Advanced Bronchoscopy [facility]    *This note was generated using voice recognition software/Dragon and/or AI transcription program.  Despite best efforts to proofread, errors can occur which can change the meaning. Any transcriptional errors that result from this process are unintentional and may not be fully corrected at the time of dictation.

## 2024-03-09 NOTE — Patient Instructions (Signed)
 VISIT SUMMARY:  Today, we discussed your chronic obstructive pulmonary disease (COPD), severe persistent asthma, bronchiectasis, and chronic atrial fibrillation. You reported episodes of lightheadedness, dizziness, and difficulty breathing, which have been affecting your daily life. We reviewed your current treatments and made some adjustments to help manage your symptoms more effectively.  YOUR PLAN:  -CHRONIC OBSTRUCTIVE PULMONARY DISEASE (COPD): COPD is a chronic lung disease that makes it hard to breathe. You have been experiencing severe shortness of breath and low oxygen levels. We recommend using albuterol nebulization before taking Ohtuvayre daily to help open your airways and improve oxygenation. We also ordered a chest x-ray to check your lung status.  -SEVERE PERSISTENT ASTHMA: Asthma is a condition where your airways narrow and swell, making it difficult to breathe. You should continue using your albuterol nebulizer before taking to wear to help open your airways and allow the medication to work better.  -BRONCHIECTASIS: Bronchiectasis is a condition where the airways in your lungs become damaged and widened, leading to mucus build-up and infections. Your current management plan is working well, so we will continue with it and monitor for any worsening symptoms.  We collected a sputum for analysis, also an antibiotic was called in for you called minocycline.  Please be careful with exposure to the sun while on this antibiotic.  -CHRONIC ATRIAL FIBRILLATION: Atrial fibrillation is an irregular and often rapid heart rate that can lead to poor blood flow. You are currently using Tikosyn to help control your heart rhythm. Please continue with this medication and monitor for any symptoms of atrial fibrillation.  INSTRUCTIONS:  Please schedule a follow-up appointment to review the results of your chest x-ray and assess how well your treatment plan is working.

## 2024-03-11 LAB — CULTURE, RESPIRATORY W GRAM STAIN

## 2024-03-11 NOTE — Addendum Note (Signed)
 Addended by: Elease Etienne A on: 03/11/2024 04:09 PM   Modules accepted: Orders

## 2024-03-11 NOTE — Progress Notes (Signed)
 Remote pacemaker transmission.

## 2024-03-14 ENCOUNTER — Telehealth (HOSPITAL_COMMUNITY): Payer: Self-pay

## 2024-03-14 NOTE — Telephone Encounter (Signed)
 Called to confirm/remind patient of their appointment at the [facility] on 03/15/24.   Patient reminded to bring all medications and/or complete list.  Confirmed patient has transportation. Gave directions, instructed to utilize valet parking.  Confirmed appointment prior to ending call.

## 2024-03-15 ENCOUNTER — Ambulatory Visit (HOSPITAL_COMMUNITY)
Admission: RE | Admit: 2024-03-15 | Discharge: 2024-03-15 | Disposition: A | Discharge status: Home / Self Care | Source: Ambulatory Visit | Attending: Family Medicine | Admitting: Family Medicine

## 2024-03-15 ENCOUNTER — Encounter (HOSPITAL_COMMUNITY): Payer: Self-pay

## 2024-03-15 ENCOUNTER — Ambulatory Visit (HOSPITAL_COMMUNITY)
Admission: RE | Admit: 2024-03-15 | Discharge: 2024-03-15 | Disposition: A | Discharge status: Home / Self Care | Payer: Medicare Other | Source: Ambulatory Visit | Attending: Family Medicine | Admitting: Family Medicine

## 2024-03-15 VITALS — BP 114/66 | HR 60 | Wt 223.4 lb

## 2024-03-15 DIAGNOSIS — I4819 Other persistent atrial fibrillation: Secondary | ICD-10-CM

## 2024-03-15 DIAGNOSIS — I272 Pulmonary hypertension, unspecified: Secondary | ICD-10-CM | POA: Diagnosis not present

## 2024-03-15 DIAGNOSIS — Z950 Presence of cardiac pacemaker: Secondary | ICD-10-CM | POA: Insufficient documentation

## 2024-03-15 DIAGNOSIS — J449 Chronic obstructive pulmonary disease, unspecified: Secondary | ICD-10-CM | POA: Diagnosis not present

## 2024-03-15 DIAGNOSIS — I5032 Chronic diastolic (congestive) heart failure: Secondary | ICD-10-CM

## 2024-03-15 DIAGNOSIS — I2510 Atherosclerotic heart disease of native coronary artery without angina pectoris: Secondary | ICD-10-CM

## 2024-03-15 DIAGNOSIS — I4510 Unspecified right bundle-branch block: Secondary | ICD-10-CM | POA: Diagnosis not present

## 2024-03-15 DIAGNOSIS — R059 Cough, unspecified: Secondary | ICD-10-CM | POA: Diagnosis not present

## 2024-03-15 DIAGNOSIS — I495 Sick sinus syndrome: Secondary | ICD-10-CM

## 2024-03-15 DIAGNOSIS — R0602 Shortness of breath: Secondary | ICD-10-CM

## 2024-03-15 DIAGNOSIS — Z9981 Dependence on supplemental oxygen: Secondary | ICD-10-CM | POA: Diagnosis not present

## 2024-03-15 DIAGNOSIS — Z79899 Other long term (current) drug therapy: Secondary | ICD-10-CM | POA: Insufficient documentation

## 2024-03-15 DIAGNOSIS — J9611 Chronic respiratory failure with hypoxia: Secondary | ICD-10-CM

## 2024-03-15 DIAGNOSIS — R058 Other specified cough: Secondary | ICD-10-CM

## 2024-03-15 DIAGNOSIS — Z87891 Personal history of nicotine dependence: Secondary | ICD-10-CM | POA: Diagnosis not present

## 2024-03-15 DIAGNOSIS — J479 Bronchiectasis, uncomplicated: Secondary | ICD-10-CM | POA: Diagnosis not present

## 2024-03-15 DIAGNOSIS — G4733 Obstructive sleep apnea (adult) (pediatric): Secondary | ICD-10-CM

## 2024-03-15 DIAGNOSIS — I110 Hypertensive heart disease with heart failure: Secondary | ICD-10-CM | POA: Insufficient documentation

## 2024-03-15 DIAGNOSIS — Z7901 Long term (current) use of anticoagulants: Secondary | ICD-10-CM | POA: Insufficient documentation

## 2024-03-15 DIAGNOSIS — I480 Paroxysmal atrial fibrillation: Secondary | ICD-10-CM | POA: Diagnosis not present

## 2024-03-15 LAB — BASIC METABOLIC PANEL
Anion gap: 12 (ref 5–15)
BUN: 25 mg/dL — ABNORMAL HIGH (ref 8–23)
CO2: 26 mmol/L (ref 22–32)
Calcium: 9.4 mg/dL (ref 8.9–10.3)
Chloride: 105 mmol/L (ref 98–111)
Creatinine, Ser: 1.19 mg/dL (ref 0.61–1.24)
GFR, Estimated: >60 mL/min (ref >60)
Glucose, Bld: 117 mg/dL — ABNORMAL HIGH (ref 70–99)
Potassium: 4.3 mmol/L (ref 3.5–5.1)
Sodium: 143 mmol/L (ref 135–145)

## 2024-03-15 NOTE — Patient Instructions (Signed)
 There has been no changes to your medications.  Labs done today, your results will be available in MyChart, we will contact you for abnormal readings.  Your physician has requested that you have an echocardiogram. Echocardiography is a painless test that uses sound waves to create images of your heart. It provides your doctor with information about the size and shape of your heart and how well your heart's chambers and valves are working. This procedure takes approximately one hour. There are no restrictions for this procedure. Please do NOT wear cologne, perfume, aftershave, or lotions (deodorant is allowed). Please arrive 15 minutes prior to your appointment time.  Please note: We ask at that you not bring children with you during ultrasound (echo/ vascular) testing. Due to room size and safety concerns, children are not allowed in the ultrasound rooms during exams. Our front office staff cannot provide observation of children in our lobby area while testing is being conducted. An adult accompanying a patient to their appointment will only be allowed in the ultrasound room at the discretion of the ultrasound technician under special circumstances. We apologize for any inconvenience.  Your physician recommends that you schedule a follow-up appointment in: 3 months.  If you have any questions or concerns before your next appointment please send [place] a message through [place] or call our office at [phone].    TO LEAVE A MESSAGE FOR THE NURSE SELECT OPTION 2, PLEASE LEAVE A MESSAGE INCLUDING: YOUR NAME DATE OF BIRTH CALL BACK NUMBER REASON FOR CALL**this is important as we prioritize the call backs  YOU WILL RECEIVE A CALL BACK THE SAME DAY AS LONG AS YOU CALL BEFORE 4:00 PM  At the [facility], you and your health needs are our priority. As part of our continuing mission to provide you with exceptional heart care, we have created designated Provider Care Teams. These Care  Teams include your primary Cardiologist (physician) and Advanced Practice Providers (APPs- Physician Assistants and Nurse Practitioners) who all work together to provide you with the care you need, when you need it.   You may see any of the following providers on your designated Care Team at your next follow up: Dr Arvilla Meres Dr Marca Ancona Dr. Dorthula Nettles Dr. Clearnce Hasten Amy Filbert Schilder, NP Robbie Lis, [place] [facility] [place], [place] Brynda Peon, NP [place] Lee, NP Clarisa Kindred, NP Karle Plumber, PharmD Enos Fling, PharmD   Please be sure to bring in all your medications bottles to every appointment.    Thank you for choosing [facility]

## 2024-03-15 NOTE — Progress Notes (Signed)
 PCP: Carylon Perches, MD Cardiology: Dr. Ladona Ridgel HF Cardiology: Dr. Shirlee Latch  78 y.o. with history of paroxysmal atrial fibrillation, COPD and chronic bronchiectasis, and diastolic CHF with prominent RV failure was referred by Dr. Ladona Ridgel for evaluation of CHF and pulmonary hypertension. Patient is on 3-4 L home oxygen and uses Bipap at night.  He quit smoking in 1985 but has COPD/chronic bronchitis.  He also has bronchiectasis with history of mucus plugging/lobar collapse.  He had a remote atrial fibrillation ablation and has been on dofetilide.  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   In 4/24, he tripped, fell, and broke several left-sided ribs.  He ended up with left lower lobe collapse due to mucus plugging.  This resolved over time. However, he has been considerably more short of breath since then.  Echo was done in 7/24, showing EF 55-60%, grade 2 diastolic dysfunction, moderate RV enlargement with mild RV dysfunction, PASP 96 mmHg, severe biatrial enlargement, mild-moderate MR, IVC dilated.  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    Today he returns for HF follow up. Overall feeling fair. On abx for AECOPD. Saw Pulm last week, CXR ordered but not completed. He has SOB walking around the house, worse when he is in AF. WEras 4 L oxygen and Bipap at night. Chronic RLE swelling. Denies palpitations, abnormal bleeding, CP, dizziness, or PND/Orthopnea. Appetite ok. No fever or chills. Weight at home 217 pounds. Taking all medications. Completed Pulm Rehab. Lives alone.  St Jude device interrogation (personally reviewed): 1.3% VP, 1.1% AF burden  Labs (2/24): BNP 231, K 4, creatinine 1.04 Labs (9/24): LDL 73 Labs (10/24): K 4.8, creatinine 0.93 Labs 912/24): K 4.5, creatinine 1.11 Labs (2/25): LDL 109  ECG (personally reviewed from 02/15/24): AF, QTc 538 msec  PMH: 1. Atrial fibrillation:  Paroxysmal.  Remote AF ablation.  Now on dofetilide.  2. GERD 3. Type 2 diabetes 4. HTN 5. Hypothyroidism 6. Hyperlipidemia 7. COPD: Chronic bronchitis.  Quit smoking 1985.  On home oxygen 3-4 L.   8. Chronic bronchiectasis 9. OSA: Uses Bipap 10. 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  - RHC (8/24): mean RA 4, PA 67/23 mean 37, mean PCWP 11, CI 2.13, PVR 5.7 WU 11. Sick sinus syndrome: St Jude PPM 12. CAD: Coronary angiography (8/24) with 50% dLCx, 40% pLAD.   Social History   Socioeconomic History   Marital status: Widowed    Spouse name: Not on file   Number of children: Not on file   Years of education: Not on file   Highest education level: Not on file  Occupational History   Not on file  Tobacco Use   Smoking status: Former    Current packs/day: 0.00    Average packs/day: 3.0 packs/day for 26.0 years (77.9 ttl pk-yrs)    Types: Cigarettes    Start date: 02/20/1958    Quit date: 02/12/1984    Years since quitting: 40.1   Smokeless tobacco: Former    Types: Chew   Tobacco comments:    Former smoker 08/30/2021  Vaping Use   Vaping status: Never Used  Substance and Sexual Activity   Alcohol use: Yes    Alcohol/week: 2.0 - 4.0 standard drinks of alcohol    Types: 1 -  2 Glasses of wine, 1 - 2 Cans of beer per week    Comment: 1 glass of wine or beer 3 times a week 12/26/22   Drug use: No   Sexual activity: Not Currently  Other Topics Concern   Not on file  Social History Narrative   Regular exercise: No   Social Drivers of Corporate investment banker Strain: Not on file  Food Insecurity: No Food Insecurity (01/13/2023)   Hunger Vital Sign    Worried About Running Out of Food in the Last Year: Never true    Ran Out of Food in the Last Year: Never true  Transportation Needs: No  Transportation Needs (01/13/2023)   PRAPARE - Administrator, Civil Service (Medical): No    Lack of Transportation (Non-Medical): No  Physical Activity: Not on file  Stress: Not on file  Social Connections: Not on file  Intimate Partner Violence: Not At Risk (01/13/2023)   Humiliation, Afraid, Rape, and Kick questionnaire    Fear of Current or Ex-Partner: No    Emotionally Abused: No    Physically Abused: No    Sexually Abused: No   Family History  Problem Relation Age of Onset   Liver cancer Mother    Pancreatic cancer Mother    Colon cancer Mother    Hypertension Father    Stroke Father    Other Father 33       Sudden Cardiac death   Heart attack Father    Cancer Sister        brain   Diabetes Sister    Colon cancer Maternal Aunt    Colon polyps Neg Hx    ROS: All systems reviewed and negative except as per HPI.   Current Outpatient Medications  Medication Sig Dispense Refill   acetaminophen (TYLENOL) 500 MG tablet Take 1,000 mg by mouth 3 (three) times daily.     albuterol (PROVENTIL) (2.5 MG/3ML) 0.083% nebulizer solution Take 3 mLs (2.5 mg total) by nebulization every 6 (six) hours as needed for wheezing or shortness of breath. 90 mL 6   albuterol (VENTOLIN HFA) 108 (90 Base) MCG/ACT inhaler Inhale 2 puffs into the lungs every 4 (four) hours as needed for shortness of breath (only if you can't catch your breath/ asthma). 18 g 5   atorvastatin (LIPITOR) 40 MG tablet Take 1 tablet (40 mg total) by mouth at bedtime. 90 tablet 3   bisoprolol (ZEBETA) 5 MG tablet TAKE 1 AND 1/2 TABLET BY MOUTH TWICE DAILY. 90 tablet 3   BREZTRI AEROSPHERE 160-9-4.8 MCG/ACT AERO INHALE 2 PUFFS INTO THE LUNGS TWICE DAILY. 10.7 g 5   Carboxymethylcellul-Glycerin (LUBRICATING EYE DROPS OP) Place 1 drop into the right eye 2 (two) times a week. Clear eyes as needed     diltiazem (CARDIZEM CD) 120 MG 24 hr capsule TAKE (1) CAPSULE BY MOUTH DAILY, MAY TAKE A EXTRA CAPSULE DAILY FOR  BREAKTHROUGH AFIB. 180 capsule 3   dofetilide (TIKOSYN) 250 MCG capsule Take 1 capsule (250 mcg total) by mouth 2 (two) times daily. 60 capsule 3   Dupilumab (DUPIXENT) 300 MG/2ML SOAJ Inject 300 mg into the skin every 14 (fourteen) days. 12 mL 1   Ensifentrine (OHTUVAYRE) 3 MG/2.5ML SUSP Take 3 mg by nebulization 2 (two) times daily.     EPINEPHrine 0.3 mg/0.3 mL IJ SOAJ injection Inject 0.3 mg into the muscle as needed for anaphylaxis. 1 each 5   fluticasone (FLONASE) 50 MCG/ACT nasal spray Place 2  sprays into both nostrils daily. 16 g 6   furosemide (LASIX) 40 MG tablet Take 1 tablet (40 mg total) by mouth 2 (two) times daily. 60 tablet 3   glipiZIDE (GLUCOTROL XL) 5 MG 24 hr tablet Take 5 mg by mouth daily before breakfast.     hydrocortisone cream 0.5 % Apply 1 Application topically daily as needed for itching.     JARDIANCE 25 MG TABS tablet Take 25 mg by mouth daily before breakfast.     levothyroxine (SYNTHROID) 150 MCG tablet Take 150 mcg by mouth daily before breakfast.     losartan (COZAAR) 50 MG tablet Take 50 mg by mouth at bedtime.     metFORMIN (GLUCOPHAGE-XR) 500 MG 24 hr tablet Take 500 mg by mouth in the morning and at bedtime.     minocycline (MINOCIN) 100 MG capsule Take 1 capsule (100 mg total) by mouth 2 (two) times daily for 10 days. 20 capsule 0   Multiple Vitamin (MULTIVITAMIN) tablet Take 1 tablet by mouth daily.     omeprazole (PRILOSEC OTC) 20 MG tablet Take 20 mg by mouth every morning.     PRESCRIPTION MEDICATION 2 (two) times daily. Thumper vest     Respiratory Therapy Supplies (FLUTTER) DEVI Use as directed 1 each 0   rivaroxaban (XARELTO) 20 MG TABS tablet Take 20 mg by mouth at bedtime.     sodium chloride (OCEAN) 0.65 % SOLN nasal spray Place 1 spray into both nostrils as needed for congestion.     No current facility-administered medications for this encounter.   BP 114/66   Pulse 60   Wt 101.3 kg (223 lb 6.4 oz)   SpO2 94% Comment: Patient is on 4-liters  of room oxygen.  BMI 32.99 kg/m  Physical Exam General:  NAD. Mild conversational dyspnea, walked into clinic on 4 L oxygen HEENT: Normal Neck: Supple. No JVD. Cor: Regular rate & rhythm. No rubs, gallops or murmurs. Lungs: Rhonchi throughout Abdomen: Soft, nontender, nondistended.  Extremities: No cyanosis, clubbing, rash, trace RLE edema Neuro: Alert & oriented x 3, moves all 4 extremities w/o difficulty. Affect pleasant.  Assessment/Plan: 1. Atrial fibrillation: Paroxysmal.  NSR today.  19% AF burden on device interrogation since 10/23 but only rare AF since 5/24. Had remote AF ablation, now on dofetilide.  QTC prolonged at 606 msec today, would like < 550 msec with RBBB.  - Continue dofetilide 250 mcg bid (Dose recently reduced 2/2 to QTc > 550 msec.). ECG personally reviewed from 02/25/24 and QTc acceptable at 538 msec. Recent Mag reviewed and stable. - Continue Xarelto. No bleeding issues. - He is on bisoprolol and diltiazem CD for rate control if AF recurs. 2. Sick sinus syndrome: St Jude PPM.  - interrogation as above. 3. 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  Echo (7/24) with EF 55-60%, grade 2 diastolic dysfunction, moderate RV enlargement with mild RV dysfunction, PASP 96 mmHg, severe biatrial enlargement, mild-moderate MR, IVC dilated.  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  NYHA class III symptoms, stable.  He is not volume overloaded by exam. - Continue Lasix 40 mg bid.  BMET today. - Continue Jardiance.  No GU symptoms. - Update echo next visit. 4. AECOPD: Chronic bronchitis, quit smoking in 1985.  On 4 L home oxygen. Rhonci in all lung fields, sats stable at 94%  on 4L. He is on minocycline, Pulm ordered CXR at [facility] last week, will complete today while he is in  clinic and I will forward to Dr. Jayme Cloud. I asked him to use his nebs/inhaler when he gets home. - Has completed pulmonary rehab.   5. Chronic bronchiectasis: Has history of mucus plugging with lobar collapse.  6. OSA: Continue BiPap, no change. 7. CAD: Coronary angiography in 8/24 showed 50% dLCx, 40% pLAD.  Medical management. No chest pain. - No ASA given Xarelto use.  - LDL 109 2/25, continue statin. Repeat LFTs/lipids arranged.  Follow up 3 months with Dr. Shirlee Latch + echo.  [facility] FNP-BC 03/15/2024

## 2024-03-23 ENCOUNTER — Encounter: Payer: Self-pay | Admitting: Cardiology

## 2024-03-24 ENCOUNTER — Other Ambulatory Visit: Payer: Self-pay

## 2024-03-24 ENCOUNTER — Encounter (HOSPITAL_COMMUNITY): Payer: Self-pay | Admitting: Cardiology

## 2024-03-24 ENCOUNTER — Other Ambulatory Visit (HOSPITAL_COMMUNITY): Payer: Self-pay | Admitting: Physician Assistant

## 2024-03-24 DIAGNOSIS — M545 Low back pain, unspecified: Secondary | ICD-10-CM

## 2024-03-24 NOTE — Progress Notes (Signed)
 Specialty Pharmacy Ongoing Clinical Assessment Note  Clarence Dawson is a 78 y.o. male who is being followed by the specialty pharmacy service for RxSp Asthma/COPD   Patient's specialty medication(s) reviewed today: Dupilumab (Dupixent)   Missed doses in the last 4 weeks: 0   Patient/Caregiver did not have any additional questions or concerns.   Therapeutic benefit summary: Patient is achieving benefit   Adverse events/side effects summary: No adverse events/side effects   Patient's therapy is appropriate to: Continue    Goals Addressed             This Visit's Progress    Minimize recurrence of flares   On track    Patient is on track. Patient will maintain adherence.  Clarence Dawson reports that he is well-controlled but does notice mild symptoms a day or two prior to his injection date.  He plans to discuss this with his provider at his upcoming office visit.          Follow up:  6 months  Otto Herb Specialty Pharmacist

## 2024-03-24 NOTE — Progress Notes (Signed)
 Specialty Pharmacy Refill Coordination Note  Clarence Dawson is a 78 y.o. male contacted today regarding refills of specialty medication(s) Dupilumab (Dupixent)   Patient requested [name] at [facility] at [place] date: 04/01/24   Medication will be filled on 03/31/24.

## 2024-03-28 ENCOUNTER — Other Ambulatory Visit: Payer: Self-pay | Admitting: Cardiology

## 2024-03-28 ENCOUNTER — Ambulatory Visit (HOSPITAL_COMMUNITY)
Admission: RE | Admit: 2024-03-28 | Discharge: 2024-03-28 | Disposition: A | Discharge status: Home / Self Care | Source: Ambulatory Visit | Attending: Physician Assistant | Admitting: Physician Assistant

## 2024-03-28 DIAGNOSIS — M545 Low back pain, unspecified: Secondary | ICD-10-CM | POA: Diagnosis present

## 2024-03-29 ENCOUNTER — Ambulatory Visit: Admitting: Pulmonary Disease

## 2024-03-29 ENCOUNTER — Encounter: Payer: Self-pay | Admitting: Pulmonary Disease

## 2024-03-29 VITALS — BP 120/64 | HR 63 | Temp 97.6°F | Ht 69.0 in | Wt 224.4 lb

## 2024-03-29 DIAGNOSIS — I272 Pulmonary hypertension, unspecified: Secondary | ICD-10-CM | POA: Diagnosis not present

## 2024-03-29 DIAGNOSIS — J479 Bronchiectasis, uncomplicated: Secondary | ICD-10-CM

## 2024-03-29 DIAGNOSIS — J4489 Other specified chronic obstructive pulmonary disease: Secondary | ICD-10-CM | POA: Diagnosis not present

## 2024-03-29 DIAGNOSIS — J9611 Chronic respiratory failure with hypoxia: Secondary | ICD-10-CM | POA: Diagnosis not present

## 2024-03-29 NOTE — Progress Notes (Unsigned)
 Subjective:    Patient ID: Clarence Dawson, male    DOB: 1946-05-17, 78 y.o.   MRN: [id number]  Patient Care Team: Carylon Perches, MD as PCP - General (Internal Medicine) Marinus Maw, MD as PCP - Cardiology (Cardiology) Marinus Maw, MD as PCP - Electrophysiology (Cardiology) Salena Saner, MD as Consulting Physician (Pulmonary Disease)  Chief Complaint  Patient presents with   Follow-up    Cough, shortness of breath and occasional wheezing.     BACKGROUND/INTERVAL: Clarence Dawson is a very complex 78 year old former smoker (69 PY) who presents for follow-up from his most recent visit of 25 January 2024.  At that time he was using Ohtuvayre.  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  Prior issues with ABPA and Aspergillus colonization.  He has also been diagnosed with moderate pulmonary arterial hypertension (group 3), moderate nonobstructive coronary artery disease.  He also has a history of atrial fibrillation on Xarelto and Tikosyn.  He has very complex issues as noted.  He completed pulmonary rehab.  He uses chest vest physiotherapy for management of his bronchiectasis.  COPD/asthma management is maximized .  On the 17 December 2023 visit he was placed on minocycline for stenotrophomonas infection (on Tikosyn and cannot have QT prolonging medications such as Levaquin and sulfa medications increase Tikosyn level).  He was last seen on 09 March 2024 and at that time required a second round of minocycline.  Sputum specimen was collected at that time and chest x-ray was ordered.   HPI Discussed the use of AI scribe software for clinical note transcription with the patient, who gave verbal consent to proceed.  History of Present Illness   Clarence Dawson "Clarence Dawson" is a 78 year old male with a complex pulmonary history who presents for follow-up.  He has a complex pulmonary history including COPD,  asthma overlap, past allergic bronchopulmonary aspergillosis (ABPA), and chronic respiratory failure with hypoxia. He feels better blood-wise than in the past and recently completed a course of minocycline, which he believes helped clear his previously noted Stenotrophomonas infection. A recent chest x-ray showed some mild CHF, but no pneumonia was detected. He experiences episodes of 'gurgly' breathing and coughs up phlegm, which temporarily relieves his symptoms. He uses a vest for airway clearance and reports it does not bother him. He is also using two inhalers, which he finds helpful.  He was restarted on diuretics and notes that this has helped his "gurgly" breathing.  He describes a recent episode where he 'blew his back out' after coughing or sneezing, leading to severe back pain and immobility. He was bedridden for a week and has only recently been able to move around. He had a CT scan to evaluate his back, recalling a similar incident ten years ago where lumbar compression was treated with injections. He is awaiting results from the recent scan to determine the next steps. The back pain has significantly impacted his daily activities, making it difficult for him to perform tasks like picking up items from the floor.  He mentions wearing a three-day monitor for cardiac evaluation about six weeks ago, but has not yet received the results. He is frustrated with the delay in receiving test results and the impact of his health issues on his quality of life.  At his last visit he had a sputum collected and this showed no evidence of Stenotrophomonas, staph or any other significant pathogen.  Nevertheless  he finished his minocycline course and feels that that this helped him.  He does not endorse any other symptomatology today.    Review of Systems A 10 point review of systems was performed and it is as noted above otherwise negative.   Patient Active Problem List   Diagnosis Date Noted   Encounter  for monitoring dofetilide therapy 02/25/2024   Chronic respiratory failure with hypoxia (HCC) 06/12/2023   Hypercoagulable state due to persistent atrial fibrillation (HCC) 01/02/2020   Chronic diastolic heart failure (HCC) 11/22/2019   Paroxysmal atrial fibrillation (HCC) 09/22/2019   Atrial fibrillation with RVR (HCC)    Lobar pneumonia (HCC) 09/21/2019   Acute respiratory failure with hypoxia (HCC) 09/21/2019   Acute on chronic respiratory failure (HCC) 09/18/2019   Severe sepsis (HCC)    Elevated lactic acid level    GERD (gastroesophageal reflux disease)    Chronic bronchitis (HCC) 02/09/2019   CAP (community acquired pneumonia) 03/03/2018   Primary localized osteoarthritis of right knee 08/11/2017   Asthma with acute exacerbation 12/08/2016   Chest pain 10/20/2016   HTN (hypertension) 10/20/2016   Morbid obesity due to excess calories (HCC) 06/24/2016   Severe persistent asthma 02/12/2016   Upper airway cough syndrome 01/24/2016   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   Persistent atrial fibrillation    Syncope    Hyperthyroidism    Hyperlipidemia    RBBB (right bundle branch block)    Tricuspid valve disorder    Aortic valve disorder    Carotid artery stenosis    Type 2 diabetes mellitus (HCC)    Pacemaker    OSA (obstructive sleep apnea)    Arthritis    Sinoatrial node dysfunction (HCC)    PVC's (premature ventricular contractions) 07/17/2011   Essential hypertension, benign 01/24/2011   Premature ventricular contractions 01/24/2011   PPM-St.Jude 01/24/2011    Social History   Tobacco Use   Smoking status: Former    Current packs/day: 0.00    Average packs/day: 3.0 packs/day for 26.0 years (77.9 ttl pk-yrs)    Types: Cigarettes    Start date: 02/20/1958    Quit date: 02/12/1984    Years since quitting: 40.1   Smokeless tobacco: Former    Types: Chew   Tobacco comments:    Former smoker 08/30/2021  Substance Use Topics   Alcohol use: Yes    Alcohol/week: 2.0 - 4.0  standard drinks of alcohol    Types: 1 - 2 Glasses of wine, 1 - 2 Cans of beer per week    Comment: 1 glass of wine or beer 3 times a week 12/26/22    Allergies  Allergen Reactions   Food Anaphylaxis and Shortness Of Breath    TREE NUTS   Iodinated Contrast Media Hives and Shortness Of Breath    Patient states hives to throat closing. (01/15/17: patient states this reaction was "about 20 years ago" with possibly an IVP.  He now says high doses of prednisone "throw me into AFib."  He has tolerated CT arthrograms with Benadrly 50mg  PO one hour before injection.  Donell Sievert, RN)   Shellfish Allergy Anaphylaxis and Shortness Of Breath    To shellfish, crabs.  Makes him feel like "things are crawling all over" me.  Denies airway issues with these foods.  Donell Sievert, RN 01/15/17)   Goat-Derived Products Hives    GOAT CHEESE    Prednisone Palpitations    PRECIPITATES A-FIB   Diclofenac Sodium Other (See Comments)    Hives, "buggy feeling  all over"   Metformin And Related Diarrhea    High doses at once   Vancomycin Anxiety    Red man syndrome   Voltaren [Diclofenac Sodium] Other (See Comments)    Feels like things are crawling on him    Current Meds  Medication Sig   acetaminophen (TYLENOL) 500 MG tablet Take 1,000 mg by mouth 3 (three) times daily.   albuterol (PROVENTIL) (2.5 MG/3ML) 0.083% nebulizer solution Take 3 mLs (2.5 mg total) by nebulization every 6 (six) hours as needed for wheezing or shortness of breath.   albuterol (VENTOLIN HFA) 108 (90 Base) MCG/ACT inhaler Inhale 2 puffs into the lungs every 4 (four) hours as needed for shortness of breath (only if you can't catch your breath/ asthma).   atorvastatin (LIPITOR) 40 MG tablet Take 1 tablet (40 mg total) by mouth at bedtime.   bisoprolol (ZEBETA) 5 MG tablet TAKE 1 AND 1/2 TABLET BY MOUTH TWICE DAILY.   Carboxymethylcellul-Glycerin (LUBRICATING EYE DROPS OP) Place 1 drop into the right eye 2 (two) times a week. Clear eyes as  needed   diltiazem (CARDIZEM CD) 120 MG 24 hr capsule TAKE (1) CAPSULE BY MOUTH DAILY, MAY TAKE A EXTRA CAPSULE DAILY FOR BREAKTHROUGH AFIB.   dofetilide (TIKOSYN) 250 MCG capsule TAKE ONE CAPSULE BY MOUTH TWICE DAILY   Dupilumab (DUPIXENT) 300 MG/2ML SOAJ Inject 300 mg into the skin every 14 (fourteen) days.   Ensifentrine (OHTUVAYRE) 3 MG/2.5ML SUSP Take 3 mg by nebulization 2 (two) times daily.   EPINEPHrine 0.3 mg/0.3 mL IJ SOAJ injection Inject 0.3 mg into the muscle as needed for anaphylaxis.   fluticasone (FLONASE) 50 MCG/ACT nasal spray Place 2 sprays into both nostrils daily.   furosemide (LASIX) 40 MG tablet Take 1 tablet (40 mg total) by mouth 2 (two) times daily.   glipiZIDE (GLUCOTROL XL) 5 MG 24 hr tablet Take 5 mg by mouth daily before breakfast.   hydrocortisone cream 0.5 % Apply 1 Application topically daily as needed for itching.   JARDIANCE 25 MG TABS tablet Take 25 mg by mouth daily before breakfast.   levothyroxine (SYNTHROID) 150 MCG tablet Take 150 mcg by mouth daily before breakfast.   losartan (COZAAR) 50 MG tablet Take 50 mg by mouth at bedtime.   metFORMIN (GLUCOPHAGE-XR) 500 MG 24 hr tablet Take 500 mg by mouth in the morning and at bedtime.   Multiple Vitamin (MULTIVITAMIN) tablet Take 1 tablet by mouth daily.   omeprazole (PRILOSEC OTC) 20 MG tablet Take 20 mg by mouth every morning.   PRESCRIPTION MEDICATION 2 (two) times daily. Thumper vest   Respiratory Therapy Supplies (FLUTTER) DEVI Use as directed   rivaroxaban (XARELTO) 20 MG TABS tablet Take 20 mg by mouth at bedtime.   sodium chloride (OCEAN) 0.65 % SOLN nasal spray Place 1 spray into both nostrils as needed for congestion.   [DISCONTINUED] BREZTRI AEROSPHERE 160-9-4.8 MCG/ACT AERO INHALE 2 PUFFS INTO THE LUNGS TWICE DAILY.    Immunization History  Administered Date(s) Administered   Fluad Quad(high Dose 65+) 09/12/2019, 10/15/2022   Influenza Split 11/14/2015, 10/11/2018   Influenza, High Dose  Seasonal PF 09/28/2017   Influenza-Unspecified 11/02/2016, 09/03/2020, 10/03/2021, 10/23/2023   Moderna SARS-COV2 Booster Vaccination 08/20/2020, 03/04/2021   Moderna Sars-Covid-2 Vaccination 02/15/2020, 03/14/2020   Pneumococcal Conjugate-13 09/28/2015   Tdap 11/28/2019        Objective:    BP 120/64 (BP Location: Left Arm, Patient Position: Sitting, Cuff Size: Normal)   Pulse 63   Temp  97.6 F (36.4 C) (Temporal)   Ht 5\' 9"  (1.753 m)   Wt 224 lb 6.4 oz (101.8 kg)   SpO2 94%   BMI 33.14 kg/m   SpO2: 94 % O2 Device: Nasal cannula O2 Flow Rate (L/min): 4 L/min O2 Type: Pulse O2  GENERAL: Well-developed, obese patient in no acute distress.  He is comfortable on nasal cannula O2.  He is fully ambulatory. HEAD: Normocephalic, atraumatic.  EYES: Pupils equal, round, reactive to light.  No scleral icterus.  MOUTH:   Upper and lower dentures.  Oral mucosa moist.  No thrush. NECK: Supple. No thyromegaly. Trachea midline. No JVD.  No adenopathy. PULMONARY: Good air entry bilaterally air entry symmetrical throughout.  Diffuse rhonchi throughout.  No wheezing.  CARDIOVASCULAR: S1 and S2.  Regular rate and rhythm, A-fib rate 60.  Grade 2/6 mitral regurgitation murmur, grade 3/6 aortic stenosis murmur. Pacemaker on left. GASTROINTESTINAL: Obese, otherwise benign. MUSCULOSKELETAL: No joint deformity, no clubbing, no edema.  NEUROLOGIC: No focal deficits, speech is fluent.  No gait disturbance noted.  Awake, alert and oriented. SKIN: Intact,warm,dry. PSYCH: Mood and behavior appropriate.  Results for orders placed or performed during the hospital encounter of 03/09/24  Expectorated Sputum Assessment w Gram Stain, Rflx to Resp Cult     Status: None   Collection Time: 03/09/24 10:00 AM   Specimen: Expectorated Sputum  Result Value Ref Range Status   Specimen Description EXPECTORATED SPUTUM  Final   Special Requests NONE  Final   Sputum evaluation   Final    THIS SPECIMEN IS ACCEPTABLE  FOR SPUTUM CULTURE Performed at [facility], [address], [place], [place] [address]    Report Status 03/09/2024 FINAL  Final  Culture, Respiratory w Gram Stain     Status: None   Collection Time: 03/09/24 10:00 AM  Result Value Ref Range Status   Specimen Description   Final    EXPECTORATED SPUTUM Performed at [facility], [address] [place], [place] [address]    Special Requests   Final    NONE Reflexed from [id number] Performed at [facility], [address] [place], [place] [address]    Gram Stain   Final    ABUNDANT WBC PRESENT, PREDOMINANTLY PMN ABUNDANT GRAM POSITIVE COCCI ABUNDANT GRAM NEGATIVE RODS    Culture   Final    Normal respiratory flora-no Staph aureus or Pseudomonas seen Performed at [facility], [address] [place], [place] [address]    Report Status 03/11/2024 FINAL  Final     Assessment & Plan:     ICD-10-CM   1. Asthma-COPD overlap syndrome (HCC) - SEVERE  J44.89     2. Chronic respiratory failure with hypoxia (HCC)  J96.11     3. Bronchiectasis without complication (HCC)  J47.9     4. Pulmonary HTN (HCC)  I27.20      Assessment and Plan    Chronic Obstructive Pulmonary Disease (COPD) with asthma overlap He has a complex pulmonary history including COPD with asthma overlap, past ABPA, and chronic respiratory failure with hypoxia. Currently, there is no evidence of pneumonia on the chest x-ray, but there is a mild amount of pulmonary edema, likely related to cardiac issues rather than infection. He reports improved respiratory symptoms and no new infections identified in recent cultures particularly after Lasix was started cardiology. The vest therapy is well tolerated and aids in mucus clearance. - Continue current inhaler regimen - Use the vest therapy three times a  day to aid in mucus clearance - Schedule follow-up appointment in two months  Chronic respiratory failure with hypoxia Chronic  respiratory failure with hypoxia is well-managed as he reports feeling better and there are no new infections. - Reassess oxygen needs at follow-up  Congestive Heart Failure (CHF) Mild pulmonary edema on chest x-ray is likely related to CHF rather than infection. He has been advised to follow up with cardiology for further management. - Monitor for symptoms of fluid overload - Coordinate care with cardiology as needed  Back pain due to lumbar spine issues He reports acute back pain after a coughing episode, with a history of lumbar spine issues involving vertebral compression. A recent CT scan was performed to assess the current state of the spine, and he is awaiting results and further management from the orthopedic specialist. - Contact orthopedic specialist to review CT scan results and determine further management  Clarence Dawson's recent exacerbation has resolved and he has improved dramatically. Previous microbiology showed no evidence of Stenotrophomonas. No other significant pathogens. I have recommended that he use the chest vest three times a day. Follow up in two months' time; he should contact us prior to the times when he knows difficulties may arise.     Advised if symptoms do not improve or worsen, to please contact office for sooner follow up or seek emergency care.    I spent 41 minutes of dedicated to the care of this patient on the date of this encounter to include pre-visit review of records, face-to-face time with the patient discussing conditions above, post visit ordering of testing, clinical documentation with the electronic health record, making appropriate referrals as documented, and communicating necessary findings to members of the patients care team.     C. Danice Goltz, MD Advanced Bronchoscopy PCCM [facility]    *This note was generated using voice recognition software/Dragon and/or AI transcription program.  Despite best efforts to proofread,  errors can occur which can change the meaning. Any transcriptional errors that result from this process are unintentional and may not be fully corrected at the time of dictation.

## 2024-03-29 NOTE — Patient Instructions (Signed)
 VISIT SUMMARY:  You came in today for a follow-up visit to discuss your complex pulmonary history and recent health issues. We reviewed your current condition, including your COPD with asthma overlap, chronic respiratory failure with hypoxia, and recent back pain. We also discussed the results of your recent chest x-ray and your ongoing treatments.  YOUR PLAN:  -CHRONIC OBSTRUCTIVE PULMONARY DISEASE (COPD) WITH ASTHMA OVERLAP: COPD with asthma overlap is a condition where symptoms of both COPD and asthma are present. Your recent chest x-ray showed no pneumonia but some fluid in the lungs, likely due to heart issues. Continue using your inhalers and vest therapy three times a day to help clear mucus. We will see you again in two months to reassess your condition.  -CHRONIC RESPIRATORY FAILURE WITH HYPOXIA: Chronic respiratory failure with hypoxia means your lungs are not getting enough oxygen into your blood. You reported feeling better, and there are no new infections. We will reassess your oxygen needs at your next follow-up.  -CONGESTIVE HEART FAILURE (CHF): CHF is a condition where the heart does not pump blood as well as it should, leading to fluid buildup. Your chest x-ray showed mild fluid in the lungs, likely due to CHF. Please monitor for symptoms of fluid overload and follow up with your cardiologist for further management.  -BACK PAIN DUE TO LUMBAR SPINE ISSUES: Your back pain is likely due to issues with your lumbar spine, possibly from a vertebral compression. You recently had a CT scan, and we are waiting for the results to determine the next steps. Please contact your orthopedic specialist to review the scan results and plan further treatment.  INSTRUCTIONS:  Please follow up with your cardiologist to manage your CHF and monitor for symptoms of fluid overload. Also, contact your orthopedic specialist to review the results of your recent CT scan and determine the next steps for your back  pain. We will see you again in two months to reassess your COPD and chronic respiratory failure.

## 2024-03-30 ENCOUNTER — Other Ambulatory Visit: Payer: Self-pay | Admitting: Pulmonary Disease

## 2024-04-06 ENCOUNTER — Other Ambulatory Visit (HOSPITAL_COMMUNITY): Payer: Self-pay | Admitting: Cardiology

## 2024-04-06 ENCOUNTER — Encounter (HOSPITAL_COMMUNITY): Payer: Self-pay | Admitting: *Deleted

## 2024-04-06 ENCOUNTER — Other Ambulatory Visit: Payer: Self-pay

## 2024-04-06 ENCOUNTER — Inpatient Hospital Stay (HOSPITAL_COMMUNITY)
Admission: EM | Admit: 2024-04-06 | Discharge: 2024-04-09 | DRG: 871 | Disposition: A | Discharge status: Home / Self Care | Attending: Internal Medicine | Admitting: Internal Medicine

## 2024-04-06 ENCOUNTER — Ambulatory Visit: Admission: EM | Admit: 2024-04-06 | Discharge: 2024-04-06 | Disposition: A | Discharge status: Home / Self Care

## 2024-04-06 ENCOUNTER — Emergency Department (HOSPITAL_COMMUNITY)

## 2024-04-06 DIAGNOSIS — J441 Chronic obstructive pulmonary disease with (acute) exacerbation: Secondary | ICD-10-CM | POA: Diagnosis present

## 2024-04-06 DIAGNOSIS — I359 Nonrheumatic aortic valve disorder, unspecified: Secondary | ICD-10-CM | POA: Diagnosis present

## 2024-04-06 DIAGNOSIS — I495 Sick sinus syndrome: Secondary | ICD-10-CM | POA: Diagnosis present

## 2024-04-06 DIAGNOSIS — Z9049 Acquired absence of other specified parts of digestive tract: Secondary | ICD-10-CM

## 2024-04-06 DIAGNOSIS — Z950 Presence of cardiac pacemaker: Secondary | ICD-10-CM

## 2024-04-06 DIAGNOSIS — J189 Pneumonia, unspecified organism: Secondary | ICD-10-CM | POA: Diagnosis present

## 2024-04-06 DIAGNOSIS — R9431 Abnormal electrocardiogram [ECG] [EKG]: Secondary | ICD-10-CM | POA: Insufficient documentation

## 2024-04-06 DIAGNOSIS — I480 Paroxysmal atrial fibrillation: Secondary | ICD-10-CM | POA: Diagnosis present

## 2024-04-06 DIAGNOSIS — E872 Acidosis, unspecified: Secondary | ICD-10-CM | POA: Insufficient documentation

## 2024-04-06 DIAGNOSIS — Z833 Family history of diabetes mellitus: Secondary | ICD-10-CM

## 2024-04-06 DIAGNOSIS — I482 Chronic atrial fibrillation, unspecified: Secondary | ICD-10-CM | POA: Diagnosis present

## 2024-04-06 DIAGNOSIS — G4733 Obstructive sleep apnea (adult) (pediatric): Secondary | ICD-10-CM | POA: Diagnosis present

## 2024-04-06 DIAGNOSIS — Z8249 Family history of ischemic heart disease and other diseases of the circulatory system: Secondary | ICD-10-CM

## 2024-04-06 DIAGNOSIS — J440 Chronic obstructive pulmonary disease with acute lower respiratory infection: Secondary | ICD-10-CM | POA: Diagnosis present

## 2024-04-06 DIAGNOSIS — Z91041 Radiographic dye allergy status: Secondary | ICD-10-CM

## 2024-04-06 DIAGNOSIS — Z79890 Hormone replacement therapy (postmenopausal): Secondary | ICD-10-CM

## 2024-04-06 DIAGNOSIS — J449 Chronic obstructive pulmonary disease, unspecified: Secondary | ICD-10-CM | POA: Insufficient documentation

## 2024-04-06 DIAGNOSIS — Z87891 Personal history of nicotine dependence: Secondary | ICD-10-CM

## 2024-04-06 DIAGNOSIS — R0602 Shortness of breath: Secondary | ICD-10-CM | POA: Diagnosis present

## 2024-04-06 DIAGNOSIS — E039 Hypothyroidism, unspecified: Secondary | ICD-10-CM | POA: Diagnosis present

## 2024-04-06 DIAGNOSIS — Z96651 Presence of right artificial knee joint: Secondary | ICD-10-CM | POA: Diagnosis present

## 2024-04-06 DIAGNOSIS — J9601 Acute respiratory failure with hypoxia: Secondary | ICD-10-CM

## 2024-04-06 DIAGNOSIS — E119 Type 2 diabetes mellitus without complications: Secondary | ICD-10-CM | POA: Diagnosis present

## 2024-04-06 DIAGNOSIS — J4551 Severe persistent asthma with (acute) exacerbation: Secondary | ICD-10-CM | POA: Diagnosis present

## 2024-04-06 DIAGNOSIS — E785 Hyperlipidemia, unspecified: Secondary | ICD-10-CM | POA: Diagnosis present

## 2024-04-06 DIAGNOSIS — K219 Gastro-esophageal reflux disease without esophagitis: Secondary | ICD-10-CM | POA: Diagnosis present

## 2024-04-06 DIAGNOSIS — Z888 Allergy status to other drugs, medicaments and biological substances status: Secondary | ICD-10-CM

## 2024-04-06 DIAGNOSIS — A419 Sepsis, unspecified organism: Principal | ICD-10-CM | POA: Diagnosis present

## 2024-04-06 DIAGNOSIS — R6520 Severe sepsis without septic shock: Secondary | ICD-10-CM | POA: Diagnosis present

## 2024-04-06 DIAGNOSIS — R7989 Other specified abnormal findings of blood chemistry: Secondary | ICD-10-CM | POA: Insufficient documentation

## 2024-04-06 DIAGNOSIS — J9621 Acute and chronic respiratory failure with hypoxia: Secondary | ICD-10-CM | POA: Diagnosis present

## 2024-04-06 DIAGNOSIS — Z85828 Personal history of other malignant neoplasm of skin: Secondary | ICD-10-CM

## 2024-04-06 DIAGNOSIS — Z947 Corneal transplant status: Secondary | ICD-10-CM | POA: Diagnosis not present

## 2024-04-06 DIAGNOSIS — Z7901 Long term (current) use of anticoagulants: Secondary | ICD-10-CM | POA: Diagnosis not present

## 2024-04-06 DIAGNOSIS — I4510 Unspecified right bundle-branch block: Secondary | ICD-10-CM | POA: Diagnosis present

## 2024-04-06 DIAGNOSIS — Z79899 Other long term (current) drug therapy: Secondary | ICD-10-CM | POA: Diagnosis not present

## 2024-04-06 DIAGNOSIS — Z91018 Allergy to other foods: Secondary | ICD-10-CM

## 2024-04-06 DIAGNOSIS — M1990 Unspecified osteoarthritis, unspecified site: Secondary | ICD-10-CM | POA: Diagnosis present

## 2024-04-06 DIAGNOSIS — I10 Essential (primary) hypertension: Secondary | ICD-10-CM | POA: Diagnosis present

## 2024-04-06 DIAGNOSIS — I5031 Acute diastolic (congestive) heart failure: Secondary | ICD-10-CM | POA: Diagnosis not present

## 2024-04-06 DIAGNOSIS — Z7951 Long term (current) use of inhaled steroids: Secondary | ICD-10-CM

## 2024-04-06 DIAGNOSIS — Z91013 Allergy to seafood: Secondary | ICD-10-CM

## 2024-04-06 DIAGNOSIS — Z881 Allergy status to other antibiotic agents status: Secondary | ICD-10-CM

## 2024-04-06 DIAGNOSIS — Z7984 Long term (current) use of oral hypoglycemic drugs: Secondary | ICD-10-CM

## 2024-04-06 LAB — CBC WITH DIFFERENTIAL/PLATELET
Abs Immature Granulocytes: 0.09 10*3/uL — ABNORMAL HIGH (ref 0.00–0.07)
Basophils Absolute: 0.1 10*3/uL (ref 0.0–0.1)
Basophils Relative: 0 %
Eosinophils Absolute: 0.1 10*3/uL (ref 0.0–0.5)
Eosinophils Relative: 1 %
HCT: 42.2 % (ref 39.0–52.0)
Hemoglobin: 13.9 g/dL (ref 13.0–17.0)
Immature Granulocytes: 1 %
Lymphocytes Relative: 8 %
Lymphs Abs: 1.2 10*3/uL (ref 0.7–4.0)
MCH: 29.2 pg (ref 26.0–34.0)
MCHC: 32.9 g/dL (ref 30.0–36.0)
MCV: 88.7 fL (ref 80.0–100.0)
Monocytes Absolute: 1 10*3/uL (ref 0.1–1.0)
Monocytes Relative: 7 %
Neutro Abs: 13 10*3/uL — ABNORMAL HIGH (ref 1.7–7.7)
Neutrophils Relative %: 83 %
Platelets: 188 10*3/uL (ref 150–400)
RBC: 4.76 MIL/uL (ref 4.22–5.81)
RDW: 13.5 % (ref 11.5–15.5)
WBC: 15.4 10*3/uL — ABNORMAL HIGH (ref 4.0–10.5)
nRBC: 0 % (ref 0.0–0.2)

## 2024-04-06 LAB — BASIC METABOLIC PANEL WITH GFR
Anion gap: 13 (ref 5–15)
BUN: 20 mg/dL (ref 8–23)
CO2: 23 mmol/L (ref 22–32)
Calcium: 8.8 mg/dL — ABNORMAL LOW (ref 8.9–10.3)
Chloride: 98 mmol/L (ref 98–111)
Creatinine, Ser: 1.18 mg/dL (ref 0.61–1.24)
GFR, Estimated: >60 mL/min (ref >60)
Glucose, Bld: 171 mg/dL — ABNORMAL HIGH (ref 70–99)
Potassium: 4 mmol/L (ref 3.5–5.1)
Sodium: 134 mmol/L — ABNORMAL LOW (ref 135–145)

## 2024-04-06 LAB — RESP PANEL BY RT-PCR (RSV, FLU A&B, COVID)  RVPGX2
Influenza A by PCR: NEGATIVE
Influenza B by PCR: NEGATIVE
Resp Syncytial Virus by PCR: NEGATIVE
SARS Coronavirus 2 by RT PCR: NEGATIVE

## 2024-04-06 LAB — LACTIC ACID, PLASMA
Lactic Acid, Venous: 2.3 mmol/L (ref 0.5–1.9)
Lactic Acid, Venous: 1.7 mmol/L (ref 0.5–1.9)

## 2024-04-06 LAB — TROPONIN I (HIGH SENSITIVITY)
Troponin I (High Sensitivity): 39 ng/L — ABNORMAL HIGH (ref <18)
Troponin I (High Sensitivity): 39 ng/L — ABNORMAL HIGH (ref <18)

## 2024-04-06 LAB — BRAIN NATRIURETIC PEPTIDE: B Natriuretic Peptide: 1169 pg/mL — ABNORMAL HIGH (ref 0.0–100.0)

## 2024-04-06 MED ORDER — RIVAROXABAN 20 MG PO TABS
20.0000 mg | ORAL_TABLET | Freq: Every day | ORAL | Status: DC
  Administered 2024-04-07: 20 mg via ORAL
  Filled 2024-04-06: qty 1

## 2024-04-06 MED ORDER — ATORVASTATIN CALCIUM 40 MG PO TABS
40.0000 mg | ORAL_TABLET | Freq: Every evening | ORAL | Status: DC
  Administered 2024-04-07 – 2024-04-08 (×2): 40 mg via ORAL
  Filled 2024-04-06 (×2): qty 1

## 2024-04-06 MED ORDER — DM-GUAIFENESIN ER 30-600 MG PO TB12
1.0000 | ORAL_TABLET | Freq: Two times a day (BID) | ORAL | Status: DC
  Administered 2024-04-07 (×2): 1 via ORAL
  Filled 2024-04-06 (×2): qty 1

## 2024-04-06 MED ORDER — ACETAMINOPHEN 325 MG PO TABS
650.0000 mg | ORAL_TABLET | Freq: Four times a day (QID) | ORAL | Status: DC | PRN
  Administered 2024-04-07: 650 mg via ORAL
  Filled 2024-04-06: qty 2

## 2024-04-06 MED ORDER — SODIUM CHLORIDE 0.9 % IV SOLN: 500.0000 mg | INTRAVENOUS | Status: DC

## 2024-04-06 MED ORDER — ONDANSETRON HCL 4 MG/2ML IJ SOLN: 4.0000 mg | Freq: Four times a day (QID) | INTRAMUSCULAR | Status: DC | PRN

## 2024-04-06 MED ORDER — SODIUM CHLORIDE 0.9 % IV SOLN
100.0000 mg | Freq: Two times a day (BID) | INTRAVENOUS | Status: DC
  Filled 2024-04-06 (×2): qty 100

## 2024-04-06 MED ORDER — OMEPRAZOLE MAGNESIUM 20 MG PO TBEC: 20.0000 mg | DELAYED_RELEASE_TABLET | Freq: Every morning | ORAL | Status: DC

## 2024-04-06 MED ORDER — DOFETILIDE 250 MCG PO CAPS
250.0000 ug | ORAL_CAPSULE | Freq: Two times a day (BID) | ORAL | Status: DC
  Administered 2024-04-07 – 2024-04-08 (×3): 250 ug via ORAL
  Filled 2024-04-06 (×6): qty 1

## 2024-04-06 MED ORDER — BISOPROLOL FUMARATE 5 MG PO TABS
5.0000 mg | ORAL_TABLET | Freq: Every day | ORAL | Status: DC
  Administered 2024-04-07 – 2024-04-09 (×3): 5 mg via ORAL
  Filled 2024-04-06 (×3): qty 1

## 2024-04-06 MED ORDER — SODIUM CHLORIDE 0.9 % IV SOLN
2.0000 g | INTRAVENOUS | Status: DC
  Administered 2024-04-07 – 2024-04-08 (×2): 2 g via INTRAVENOUS
  Filled 2024-04-06 (×2): qty 20

## 2024-04-06 MED ORDER — ACETAMINOPHEN 650 MG RE SUPP: 650.0000 mg | Freq: Four times a day (QID) | RECTAL | Status: DC | PRN

## 2024-04-06 MED ORDER — SODIUM CHLORIDE 0.9 % IV BOLUS (SEPSIS)
1000.0000 mL | Freq: Once | INTRAVENOUS | Status: AC
  Administered 2024-04-06: 1000 mL via INTRAVENOUS

## 2024-04-06 MED ORDER — ACETAMINOPHEN 500 MG PO TABS
1000.0000 mg | ORAL_TABLET | Freq: Once | ORAL | Status: AC
  Administered 2024-04-06: 1000 mg via ORAL
  Filled 2024-04-06: qty 2

## 2024-04-06 MED ORDER — ALBUTEROL SULFATE (2.5 MG/3ML) 0.083% IN NEBU: 2.5000 mg | INHALATION_SOLUTION | Freq: Four times a day (QID) | RESPIRATORY_TRACT | Status: DC | PRN

## 2024-04-06 MED ORDER — LEVOTHYROXINE SODIUM 75 MCG PO TABS
150.0000 ug | ORAL_TABLET | Freq: Every day | ORAL | Status: DC
  Administered 2024-04-07 – 2024-04-09 (×3): 150 ug via ORAL
  Filled 2024-04-06 (×3): qty 2

## 2024-04-06 MED ORDER — SODIUM CHLORIDE 0.9 % IV SOLN
500.0000 mg | Freq: Once | INTRAVENOUS | Status: AC
  Administered 2024-04-06: 500 mg via INTRAVENOUS
  Filled 2024-04-06: qty 5

## 2024-04-06 MED ORDER — DILTIAZEM HCL ER COATED BEADS 120 MG PO CP24
120.0000 mg | ORAL_CAPSULE | Freq: Every day | ORAL | Status: DC
  Administered 2024-04-07 – 2024-04-09 (×3): 120 mg via ORAL
  Filled 2024-04-06 (×3): qty 1

## 2024-04-06 MED ORDER — PANTOPRAZOLE SODIUM 40 MG PO TBEC
40.0000 mg | DELAYED_RELEASE_TABLET | Freq: Every day | ORAL | Status: DC
  Administered 2024-04-07 – 2024-04-09 (×3): 40 mg via ORAL
  Filled 2024-04-06 (×3): qty 1

## 2024-04-06 MED ORDER — IPRATROPIUM-ALBUTEROL 0.5-2.5 (3) MG/3ML IN SOLN
3.0000 mL | Freq: Once | RESPIRATORY_TRACT | Status: AC
  Administered 2024-04-06: 3 mL via RESPIRATORY_TRACT
  Filled 2024-04-06: qty 3

## 2024-04-06 MED ORDER — LOSARTAN POTASSIUM 50 MG PO TABS
50.0000 mg | ORAL_TABLET | Freq: Every day | ORAL | Status: DC
  Administered 2024-04-07: 50 mg via ORAL
  Filled 2024-04-06: qty 1

## 2024-04-06 MED ORDER — ONDANSETRON HCL 4 MG PO TABS: 4.0000 mg | ORAL_TABLET | Freq: Four times a day (QID) | ORAL | Status: DC | PRN

## 2024-04-06 MED ORDER — SODIUM CHLORIDE 0.9 % IV SOLN
2.0000 g | Freq: Once | INTRAVENOUS | Status: AC
  Administered 2024-04-06: 2 g via INTRAVENOUS
  Filled 2024-04-06: qty 20

## 2024-04-06 NOTE — ED Notes (Signed)
 Patient ambulated to the bathroom with portable o2. Patient extremely sob when back, felt dizzy. Spo2 low 80s%, up to 88% at about 5 minutes of rest, and by 15 minutes at 91%.

## 2024-04-06 NOTE — Progress Notes (Signed)
 CODE SEPSIS - PHARMACY COMMUNICATION  **Broad Spectrum Antibiotics should be administered within 1 hour of Sepsis diagnosis**  Time Code Sepsis Called/Page Received: 1910  Antibiotics Ordered: ceftriaxone and azithromycin  Time of 1st antibiotic administration: 1925  Additional action taken by pharmacy: None  If necessary, Name of Provider/Nurse Contacted: None    Rockwell Alexandria ,PharmD Clinical Pharmacist  04/06/2024  7:11 PM

## 2024-04-06 NOTE — ED Notes (Signed)
 Clarence Dawson updated as to patient's status. Ok'd by patient.

## 2024-04-06 NOTE — ED Triage Notes (Signed)
 Pt sent from UC for low oxygen with O2 at 4 L/M [place].

## 2024-04-06 NOTE — H&P (Addendum)
 History and Physical    Patient: Clarence Dawson UJW:[id number] DOB: [birth year] DOA: 04/06/2024 DOS: the patient was seen and examined on 04/07/2024 PCP: Carylon Perches, MD  Patient coming from: Home  Chief Complaint:  Chief Complaint  Patient presents with   Shortness of Breath   HPI: Clarence Dawson is a 78 y.o. male with medical history significant of COPD, severe persistent asthma, paroxysmal atrial fibrillation, sinus node dysfunction status post PPM, chronic respiratory failure with hypoxia on 4 LPM of oxygen at baseline, OSA on CPAP, hypothyroidism, hyperlipidemia who presented to the emergency department due to fever and chills which started around 1 AM today and lasted till about 4 AM.  On waking up in the morning, he still felt sick with increased shortness of breath, so he went to an urgent care where he was noted to have an O2 sat of 88% on 4 L of oxygen and was asked to go to the ED for further evaluation and management.  ED Course:  In the emergency department, respiratory rate was 26/min, BP was 124/58, O2 sat was 88% on supplemental oxygen at 4 LPM, temperature 26F, other vital signs are within normal range.  Workup in the ED showed normal CBC except for leukocytosis.  BMP was normal except for sodium of 134 and blood glucose of 171, troponin x 2 was flat at 79, BNP 1,169 lactic acidosis.  Influenza A, B, SARS, 2, RSV was negative. Chest x-ray was suggestive of atelectasis or developing bronchopneumonia Patient was started on IV ceftriaxone and azithromycin, Tylenol was given.  Hospitalist was asked to admit patient for further evaluation and management.  Review of Systems: Review of systems as noted in the HPI. All other systems reviewed and are negative.   Past Medical History:  Diagnosis Date   Allergic rhinitis    Aortic valve disorder    Asthma    since childhood- seasonal allergies induced   Cancer (HCC)    Skin cancer- squamous, basal   Carotid artery stenosis     Essential hypertension    Full dentures    GERD (gastroesophageal reflux disease)    H/O hiatal hernia    Hemorrhage of rectum    Hyperlipidemia    Hypothyroidism    Male circumcision    OSA (obstructive sleep apnea)    Osteoarthritis    Pacemaker    Oct 2005 in [place].   PAF (paroxysmal atrial fibrillation) (HCC)    Pneumonia    "several Times" 2015 last time   Primary localized osteoarthritis of right knee 08/11/2017   RBBB (right bundle branch block)    Sinoatrial node dysfunction (HCC)    Syncope    Tricuspid valve disorder    Type 2 diabetes mellitus (HCC)    Type II   Past Surgical History:  Procedure Laterality Date   A-V CARDIAC PACEMAKER INSERTION     Sick sinus syndrome DDR pacer   Arthropathy Right 2005   Rebuilding of left thumb and joint    CARDIAC CATHETERIZATION     CARDIAC ELECTROPHYSIOLOGY STUDY AND ABLATION  09/2008   for pvcs, Dr. Vesta Mixer   CARDIOVERSION N/A 12/18/2016   Procedure: CARDIOVERSION;  Surgeon: Chrystie Nose, MD;  Location: [facility] ENDOSCOPY;  Service: Cardiovascular;  Laterality: N/A;   CARDIOVERSION N/A 09/05/2021   Procedure: CARDIOVERSION;  Surgeon: Sande Rives, MD;  Location: [facility] ENDOSCOPY;  Service: Cardiovascular;  Laterality: N/A;   CARDIOVERSION N/A 08/06/2022   Procedure: CARDIOVERSION;  Surgeon: Little Ishikawa, MD;  Location: [facility] ENDOSCOPY;  Service: Cardiovascular;  Laterality: N/A;   CARPAL TUNNEL RELEASE  1994   right wrist   CARPAL TUNNEL RELEASE  05/04/2012   Procedure: CARPAL TUNNEL RELEASE;  Surgeon: Nicki Reaper, MD;  Location: [facility];  Service: Orthopedics;  Laterality: Left;   CARPOMETACARPEL SUSPENSION PLASTY Right 11/16/2014   Procedure: SUSPENSIONPLASTY RIGHT THUMB TENDON TRANSFER ABDUCTOR POLLICUS LONGUS EXCISION TRAPEZIUM;  Surgeon: Cindee Salt, MD;  Location: [facility];  Service: Orthopedics;  Laterality: Right;   CHOLECYSTECTOMY  1994   CIRCUMCISION     COLONOSCOPY N/A  03/14/2013   Procedure: COLONOSCOPY;  Surgeon: Corbin Ade, MD;  Location: [facility];  Service: Endoscopy;  Laterality: N/A;  8:15 AM   EYE SURGERY     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-[facility]   EYE SURGERY  2012   Left eye Corneal transplant- partial- Cataract   FLEXIBLE BRONCHOSCOPY Right 06/23/2019   Procedure: FLEXIBLE BRONCHOSCOPY RIGHT;  Surgeon: Shane Crutch, MD;  Location: [facility] ORS;  Service: Pulmonary;  Laterality: Right;   GALLBLADDER SURGERY  12/01/2006   HAND TENDON SURGERY Left late 1990's   thumb   HEMORROIDECTOMY  2003   INJECTION KNEE Left 08/26/2017   Procedure: LEFT KNEE INJECTION;  Surgeon: Salvatore Marvel, MD;  Location: [facility] OR;  Service: Orthopedics;  Laterality: Left;   left Knee Arthroscopy     [facility]   PARTIAL KNEE ARTHROPLASTY  11/22/2012   Procedure: UNICOMPARTMENTAL KNEE;  Surgeon: Nilda Simmer, MD;  Location: [facility] OR;  Service: Orthopedics;  Laterality: Left;  left unicompartmental knee arthroplasty   PERMANENT PACEMAKER GENERATOR CHANGE N/A 01/12/2013   Procedure: PERMANENT PACEMAKER GENERATOR CHANGE;  Surgeon: Marinus Maw, MD;  Location: [facility] CATH LAB;  Service: Cardiovascular;  Laterality: N/A;   PPM GENERATOR CHANGEOUT N/A 07/07/2022   Procedure: PPM GENERATOR CHANGEOUT;  Surgeon: Marinus Maw, MD;  Location: [facility] INVASIVE CV LAB;  Service: Cardiovascular;  Laterality: N/A;   RIGHT/LEFT HEART CATH AND CORONARY ANGIOGRAPHY N/A 08/28/2023   Procedure: RIGHT/LEFT HEART CATH AND CORONARY ANGIOGRAPHY;  Surgeon: Laurey Morale, MD;  Location: [facility] INVASIVE CV LAB;  Service: Cardiovascular;  Laterality: N/A;   Rotator cuff Surgery  2001   Right shoulder   TONSILLECTOMY     TOTAL KNEE ARTHROPLASTY Right 08/26/2017   Procedure: TOTAL KNEE ARTHROPLASTY;  Surgeon: Salvatore Marvel, MD;  Location: [facility] OR;  Service: Orthopedics;  Laterality: Right;   varicose vein reduction      Social History:  reports that he quit smoking about 40  years ago. His smoking use included cigarettes. He started smoking about 66 years ago. He has a 77.9 pack-year smoking history. He has quit using smokeless tobacco.  His smokeless tobacco use included chew. He reports current alcohol use of about 2.0 - 4.0 standard drinks of alcohol per week. He reports that he does not use drugs.   Allergies  Allergen Reactions   Food Anaphylaxis and Shortness Of Breath    TREE NUTS   Iodinated Contrast Media Hives and Shortness Of Breath    Patient states hives to throat closing. (01/15/17: patient states this reaction was "about 20 years ago" with possibly an IVP.  He now says high doses of prednisone "throw me into AFib."  He has tolerated CT arthrograms with Benadrly 50mg  PO one hour before injection.  Donell Sievert, RN)   Shellfish Allergy Anaphylaxis and Shortness Of Breath    To shellfish, crabs.  Makes  him feel like "things are crawling all over" me.  Denies airway issues with these foods.  Donell Sievert, RN 01/15/17)   Goat-Derived Products Hives    GOAT CHEESE    Prednisone Palpitations    PRECIPITATES A-FIB   Diclofenac Sodium Other (See Comments)    Hives, "buggy feeling all over"   Metformin And Related Diarrhea    High doses at once   Vancomycin Anxiety    Red man syndrome   Voltaren [Diclofenac Sodium] Other (See Comments)    Feels like things are crawling on him    Family History  Problem Relation Age of Onset   Liver cancer Mother    Pancreatic cancer Mother    Colon cancer Mother    Hypertension Father    Stroke Father    Other Father 30       Sudden Cardiac death   Heart attack Father    Cancer Sister        brain   Diabetes Sister    Colon cancer Maternal Aunt    Colon polyps Neg Hx      Prior to Admission medications   Medication Sig Start Date End Date Taking? Authorizing Provider  acetaminophen (TYLENOL) 500 MG tablet Take 1,000 mg by mouth 3 (three) times daily.   Yes [provider]  albuterol (PROVENTIL)  (2.5 MG/3ML) 0.083% nebulizer solution Take 3 mLs (2.5 mg total) by nebulization every 6 (six) hours as needed for wheezing or shortness of breath. 03/09/24  Yes Salena Saner, MD  atorvastatin (LIPITOR) 40 MG tablet Take 1 tablet (40 mg total) by mouth daily. 04/06/24  Yes Laurey Morale, MD  bisoprolol (ZEBETA) 5 MG tablet TAKE 1 AND 1/2 TABLET BY MOUTH TWICE DAILY. 04/17/17  Yes Newman Nip, NP  BREZTRI AEROSPHERE 160-9-4.8 MCG/ACT AERO INHALE 2 PUFFS INTO THE LUNGS TWICE DAILY. 03/30/24  Yes Salena Saner, MD  Carboxymethylcellul-Glycerin (LUBRICATING EYE DROPS OP) Place 1 drop into the right eye 2 (two) times a week. Clear eyes as needed   Yes [provider]  diltiazem (CARDIZEM CD) 120 MG 24 hr capsule TAKE (1) CAPSULE BY MOUTH DAILY, MAY TAKE A EXTRA CAPSULE DAILY FOR BREAKTHROUGH AFIB. 12/09/23  Yes Marinus Maw, MD  dofetilide (TIKOSYN) 250 MCG capsule TAKE ONE CAPSULE BY MOUTH TWICE DAILY 03/28/24  Yes Laurey Morale, MD  Dupilumab (DUPIXENT) 300 MG/2ML SOAJ Inject 300 mg into the skin every 14 (fourteen) days. 11/09/23  Yes Salena Saner, MD  Ensifentrine [facility]) 3 MG/2.5ML SUSP Take 3 mg by nebulization 2 (two) times daily. 10/26/23  Yes Salena Saner, MD  EPINEPHrine 0.3 mg/0.3 mL IJ SOAJ injection Inject 0.3 mg into the muscle as needed for anaphylaxis. 11/05/20  Yes Salena Saner, MD  fluticasone (FLONASE) 50 MCG/ACT nasal spray Place 2 sprays into both nostrils daily. 10/21/23  Yes Jovita Kussmaul B, MD  furosemide (LASIX) 40 MG tablet Take 1 tablet (40 mg total) by mouth 2 (two) times daily. 12/16/23  Yes Laurey Morale, MD  glipiZIDE (GLUCOTROL XL) 5 MG 24 hr tablet Take 5 mg by mouth daily before breakfast.   Yes [provider]  JARDIANCE 25 MG TABS tablet Take 25 mg by mouth daily before breakfast. 04/11/21  Yes [provider]  levothyroxine (SYNTHROID) 150 MCG tablet Take 150 mcg by mouth daily before breakfast. 06/01/23   Yes [provider]  losartan (COZAAR) 50 MG tablet Take 50 mg by mouth at bedtime.  04/27/20  Yes [provider]  metFORMIN (GLUCOPHAGE-XR) 500 MG 24 hr tablet Take 500 mg by mouth in the morning and at bedtime.   Yes [provider]  Multiple Vitamin (MULTIVITAMIN) tablet Take 1 tablet by mouth daily.   Yes [provider]  omeprazole (PRILOSEC OTC) 20 MG tablet Take 20 mg by mouth every morning.   Yes [provider]  PRESCRIPTION MEDICATION 2 (two) times daily. Thumper vest   Yes [provider]  rivaroxaban (XARELTO) 20 MG TABS tablet Take 20 mg by mouth at bedtime.   Yes [provider]  sodium chloride (OCEAN) 0.65 % SOLN nasal spray Place 1 spray into both nostrils as needed for congestion.   Yes [provider]  Respiratory Therapy Supplies (FLUTTER) DEVI Use as directed 10/10/19   Salena Saner, MD    Physical Exam: BP (!) 125/51   Pulse (!) 59   Temp (!) 97.4 F (36.3 C) (Oral)   Resp 18   Ht 5\' 9"  (1.753 m)   Wt 99.1 kg   SpO2 91%   BMI 32.25 kg/m   General: 78 y.o. year-old male well developed well nourished in no acute distress.  Alert and oriented x3. HEENT: NCAT, EOMI Neck: Supple, trachea medial Cardiovascular: Regular rate and rhythm with no rubs or gallops.  No thyromegaly or JVD noted.  No lower extremity edema. 2/4 pulses in all 4 extremities. Respiratory: Diffuse rhonchi and mild expiratory wheezes on auscultation. Abdomen: Soft, nontender, nondistended with normal bowel sounds x4 quadrants. Muskuloskeletal: No cyanosis, clubbing or edema noted bilaterally Neuro: CN II-XII intact, strength 5/5 x 4, sensation, reflexes intact Skin: No ulcerative lesions noted or rashes Psychiatry: Judgement and insight appear normal. Mood is appropriate for condition and setting          Labs on Admission:  Basic Metabolic Panel: Recent Labs  Lab 04/06/24 1732  NA 134*  K 4.0  CL 98  CO2 23   GLUCOSE 171*  BUN 20  CREATININE 1.18  CALCIUM 8.8*   Liver Function Tests: No results for input(s): "AST", "ALT", "ALKPHOS", "BILITOT", "PROT", "ALBUMIN" in the last 168 hours. No results for input(s): "LIPASE", "AMYLASE" in the last 168 hours. No results for input(s): "AMMONIA" in the last 168 hours. CBC: Recent Labs  Lab 04/06/24 1732  WBC 15.4*  NEUTROABS 13.0*  HGB 13.9  HCT 42.2  MCV 88.7  PLT 188   Cardiac Enzymes: No results for input(s): "CKTOTAL", "CKMB", "CKMBINDEX", "TROPONINI" in the last 168 hours.  BNP (last 3 results) Recent Labs    09/01/23 1200 12/16/23 1210 04/06/24 1732  BNP 225.8* 256.5* 1,169.0*    ProBNP (last 3 results) No results for input(s): "PROBNP" in the last 8760 hours.  CBG: No results for input(s): "GLUCAP" in the last 168 hours.  Radiological Exams on Admission: DG Chest Port 1 View Result Date: 04/06/2024 CLINICAL DATA:  sob, fever and chills EXAM: PORTABLE CHEST 1 VIEW COMPARISON:  March 15, 2024, April 09, 2023 FINDINGS: Flattening of the diaphragms with coarsening of the pulmonary interstitium, likely due to underlying emphysema. Retrocardiac airspace opacities. No pleural effusion or pneumothorax. Mild cardiomegaly. Left chest pacemaker with leads terminating in the right atrium right ventricle. No acute fracture or destructive lesions. Multilevel thoracic osteophytosis. IMPRESSION: Emphysema. Patchy retrocardiac airspace opacities, which may reflect atelectasis or developing bronchopneumonia, in the correct clinical context. Electronically Signed   By: Wallie Char M.D.   On: 04/06/2024 19:01    EKG: I  independently viewed the EKG done and my findings are as followed: Paced rhythm at a rate of 60 bpm with QTc of 564 ms  Assessment/Plan Present on Admission:  Sepsis due to pneumonia (HCC)  Acute on chronic respiratory failure with hypoxia (HCC)  Essential hypertension, benign  Principal Problem:   Sepsis due to pneumonia  [facility]) Active Problems:   Essential hypertension, benign   Type 2 diabetes mellitus (HCC)   OSA on CPAP   Acute on chronic respiratory failure with hypoxia (HCC)   Lactic acidosis   Elevated brain natriuretic peptide (BNP) level   COPD (chronic obstructive pulmonary disease) (HCC)   Elevated troponin   Prolonged QT interval   Atrial fibrillation, chronic (HCC)   Acquired hypothyroidism  Sepsis secondary to pneumonia  Patient met sepsis criteria due to having leukocytosis and tachypnea.  Source of infection was pneumonia. Patient was started on ceftriaxone and azithromycin, we shall continue same at this time with plan to de-escalate/discontinue based on blood culture, sputum culture, urine Legionella, strep pneumo and procalcitonin Continue Tylenol as needed Continue Mucinex, incentive spirometry, flutter valve   Lactic acidosis due to above Lactic acid 2.3 > 1.7, continue management as described above  Acute on chronic respiratory failure with hypoxia Patient is currently requiring 6 LPM of oxygen from 4 LPM (baseline). Continue to maintain O2 sat > 92% with plan to wean patient down to baseline oxygen requirement as tolerated.  COPD (not in acute exacerbation) Continue albuterol, Mucinex, azithromycin. Continue incentive spirometry and flutter valve  Elevated BNP r/o CHF BNP 1,169 Continue total input/output, daily weights and fluid restriction Continue heart healthy diet  Echocardiogram in the morning   Elevated troponin possibly secondary to type II demand ischemia Troponin x 2 was flat at 39, patient denies any chest pain  Prolonged QT interval QTc 564 ms, patient has a pacemaker Avoid QT prolonging drugs Magnesium level will be checked Repeat EKG in the morning  OSA on CPAP Continue CPAP  Chronic atrial fibrillation Continue Cardizem, bisoprolol, Xarelto, Tikosyn  Essential hypertension Continue losartan, bisoprolol, Cardizem  Type 2 diabetes  mellitus Continue ISS and hypoglycemia protocol Glipizide, metformin will be held at this time  Acquired hypothyroidism Continue Synthroid  GERD Continue Prilosec  DVT prophylaxis: Xarelto  Code Status: Full code  Family Communication: None at bedside  Consults: None  Severity of Illness: The appropriate patient status for this patient is INPATIENT. Inpatient status is judged to be reasonable and necessary in order to provide the required intensity of service to ensure the patient's safety. The patient's presenting symptoms, physical exam findings, and initial radiographic and laboratory data in the context of their chronic comorbidities is felt to place them at high risk for further clinical deterioration. Furthermore, it is not anticipated that the patient will be medically stable for discharge from the hospital within 2 midnights of admission.   * I certify that at the point of admission it is my clinical judgment that the patient will require inpatient hospital care spanning beyond 2 midnights from the point of admission due to high intensity of service, high risk for further deterioration and high frequency of surveillance required.*  Author: Frankey Shown, DO 04/07/2024 12:38 AM  For on call review [email or web address].

## 2024-04-06 NOTE — ED Provider Notes (Signed)
 [facility]    CSN: [id number] Arrival date & time: 04/06/24  1618      History   Chief Complaint Chief Complaint  Patient presents with   Fever    HPI Clarence Dawson is a 78 y.o. male.   Patient presents today with 1 day history of fever, chills, hot sweats, and shortness of breath since he woke up this morning.  He has not checked his oxygen level at home but wears 4 L by nasal cannula at baseline due to history of COPD.  Reports he increase his oxygen level to 5 L by nasal cannula and has remained dyspneic.  No sore throat, significant cough or congestion.  No abdominal pain, nausea/vomiting, or diarrhea.  No known sick contacts.    Past Medical History:  Diagnosis Date   Allergic rhinitis    Aortic valve disorder    Asthma    since childhood- seasonal allergies induced   Cancer (HCC)    Skin cancer- squamous, basal   Carotid artery stenosis    Essential hypertension    Full dentures    GERD (gastroesophageal reflux disease)    H/O hiatal hernia    Hemorrhage of rectum    Hyperlipidemia    Hypothyroidism    Male circumcision    OSA (obstructive sleep apnea)    Osteoarthritis    Pacemaker    Oct 2005 in [employer].   PAF (paroxysmal atrial fibrillation) (HCC)    Pneumonia    "several Times" 2015 last time   Primary localized osteoarthritis of right knee 08/11/2017   RBBB (right bundle branch block)    Sinoatrial node dysfunction (HCC)    Syncope    Tricuspid valve disorder    Type 2 diabetes mellitus (HCC)    Type II    Patient Active Problem List   Diagnosis Date Noted   Encounter for monitoring dofetilide therapy 02/25/2024   Chronic respiratory failure with hypoxia (HCC) 06/12/2023   Hypercoagulable state due to persistent atrial fibrillation (HCC) 01/02/2020   Chronic diastolic heart failure (HCC) 11/22/2019   Paroxysmal atrial fibrillation (HCC) 09/22/2019   Atrial fibrillation with RVR (HCC)    Lobar pneumonia (HCC) 09/21/2019   Acute  respiratory failure with hypoxia (HCC) 09/21/2019   Acute on chronic respiratory failure (HCC) 09/18/2019   Severe sepsis (HCC)    Elevated lactic acid level    GERD (gastroesophageal reflux disease)    Chronic bronchitis (HCC) 02/09/2019   CAP (community acquired pneumonia) 03/03/2018   Primary localized osteoarthritis of right knee 08/11/2017   Asthma with acute exacerbation 12/08/2016   Chest pain 10/20/2016   HTN (hypertension) 10/20/2016   Morbid obesity due to excess calories (HCC) 06/24/2016   Severe persistent asthma 02/12/2016   Upper airway cough syndrome 01/24/2016   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   Persistent atrial fibrillation    Syncope    Hyperthyroidism    Hyperlipidemia    RBBB (right bundle branch block)    Tricuspid valve disorder    Aortic valve disorder    Carotid artery stenosis    Type 2 diabetes mellitus (HCC)    Pacemaker    OSA (obstructive sleep apnea)    Arthritis    Sinoatrial node dysfunction (HCC)    PVC's (premature ventricular contractions) 07/17/2011   Essential hypertension, benign 01/24/2011   Premature ventricular contractions 01/24/2011   PPM-[facility] 01/24/2011    Past Surgical History:  Procedure Laterality Date   A-V CARDIAC PACEMAKER INSERTION  Sick sinus syndrome DDR pacer   Arthropathy Right 2005   Rebuilding of left thumb and joint    CARDIAC CATHETERIZATION     CARDIAC ELECTROPHYSIOLOGY STUDY AND ABLATION  09/2008   for pvcs, Dr. Vesta Mixer   CARDIOVERSION N/A 12/18/2016   Procedure: CARDIOVERSION;  Surgeon: Chrystie Nose, MD;  Location[facility] ENDOSCOPY;  Service: Cardiovascular;  Laterality: N/A;   CARDIOVERSION N/A 09/05/2021   Procedure: CARDIOVERSION;  Surgeon: Sande Rives, MD;  Location: [facility] ENDOSCOPY;  Service: Cardiovascular;  Laterality: N/A;   CARDIOVERSION N/A 08/06/2022   Procedure: CARDIOVERSION;  Surgeon: Little Ishikawa, MD;  Location: [facility] ENDOSCOPY;  Service: Cardiovascular;  Laterality: N/A;   CARPAL  TUNNEL RELEASE  1994   right wrist   CARPAL TUNNEL RELEASE  05/04/2012   Procedure: CARPAL TUNNEL RELEASE;  Surgeon: Nicki Reaper, MD;  Location: [facility];  Service: Orthopedics;  Laterality: Left;   CARPOMETACARPEL SUSPENSION PLASTY Right 11/16/2014   Procedure: SUSPENSIONPLASTY RIGHT THUMB TENDON TRANSFER ABDUCTOR POLLICUS LONGUS EXCISION TRAPEZIUM;  Surgeon: Cindee Salt, MD;  Location:  SURGERY CENTER;  Service: Orthopedics;  Laterality: Right;   CHOLECYSTECTOMY  1994   CIRCUMCISION     COLONOSCOPY N/A 03/14/2013   Procedure: COLONOSCOPY;  Surgeon: Corbin Ade, MD;  Location: [facility];  Service: Endoscopy;  Laterality: N/A;  8:15 AM   EYE SURGERY     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-[facility]   EYE SURGERY  2012   Left eye Corneal transplant- partial- Cataract   FLEXIBLE BRONCHOSCOPY Right 06/23/2019   Procedure: FLEXIBLE BRONCHOSCOPY RIGHT;  Surgeon: Shane Crutch, MD;  Location: [facility] ORS;  Service: Pulmonary;  Laterality: Right;   GALLBLADDER SURGERY  12/01/2006   HAND TENDON SURGERY Left late 1990's   thumb   HEMORROIDECTOMY  2003   INJECTION KNEE Left 08/26/2017   Procedure: LEFT KNEE INJECTION;  Surgeon: Salvatore Marvel, MD;  Location: [facility] OR;  Service: Orthopedics;  Laterality: Left;   left Knee Arthroscopy     [facility]   PARTIAL KNEE ARTHROPLASTY  11/22/2012   Procedure: UNICOMPARTMENTAL KNEE;  Surgeon: Nilda Simmer, MD;  Location: [facility] OR;  Service: Orthopedics;  Laterality: Left;  left unicompartmental knee arthroplasty   PERMANENT PACEMAKER GENERATOR CHANGE N/A 01/12/2013   Procedure: PERMANENT PACEMAKER GENERATOR CHANGE;  Surgeon: Marinus Maw, MD;  Location: [facility] CATH LAB;  Service: Cardiovascular;  Laterality: N/A;   PPM GENERATOR CHANGEOUT N/A 07/07/2022   Procedure: PPM GENERATOR CHANGEOUT;  Surgeon: Marinus Maw, MD;  Location: [facility] INVASIVE CV LAB;  Service: Cardiovascular;  Laterality: N/A;   RIGHT/LEFT HEART  CATH AND CORONARY ANGIOGRAPHY N/A 08/28/2023   Procedure: RIGHT/LEFT HEART CATH AND CORONARY ANGIOGRAPHY;  Surgeon: Laurey Morale, MD;  Location: [facility] INVASIVE CV LAB;  Service: Cardiovascular;  Laterality: N/A;   Rotator cuff Surgery  2001   Right shoulder   TONSILLECTOMY     TOTAL KNEE ARTHROPLASTY Right 08/26/2017   Procedure: TOTAL KNEE ARTHROPLASTY;  Surgeon: Salvatore Marvel, MD;  Location: [facility] OR;  Service: Orthopedics;  Laterality: Right;   varicose vein reduction         Home Medications    Prior to Admission medications   Medication Sig Start Date End Date Taking? Authorizing Provider  acetaminophen (TYLENOL) 500 MG tablet Take 1,000 mg by mouth 3 (three) times daily.    [provider]  albuterol (PROVENTIL) (2.5 MG/3ML) 0.083% nebulizer solution Take 3 mLs (2.5 mg total) by nebulization every  6 (six) hours as needed for wheezing or shortness of breath. 03/09/24   Salena Saner, MD  albuterol (VENTOLIN HFA) 108 (90 Base) MCG/ACT inhaler Inhale 2 puffs into the lungs every 4 (four) hours as needed for shortness of breath (only if you can't catch your breath/ asthma). 05/19/23   Salena Saner, MD  atorvastatin (LIPITOR) 40 MG tablet Take 1 tablet (40 mg total) by mouth daily. 04/06/24   Laurey Morale, MD  bisoprolol (ZEBETA) 5 MG tablet TAKE 1 AND 1/2 TABLET BY MOUTH TWICE DAILY. 04/17/17   Newman Nip, NP  BREZTRI AEROSPHERE 160-9-4.8 MCG/ACT AERO INHALE 2 PUFFS INTO THE LUNGS TWICE DAILY. 03/30/24   Salena Saner, MD  Carboxymethylcellul-Glycerin (LUBRICATING EYE DROPS OP) Place 1 drop into the right eye 2 (two) times a week. Clear eyes as needed    [provider]  diltiazem (CARDIZEM CD) 120 MG 24 hr capsule TAKE (1) CAPSULE BY MOUTH DAILY, MAY TAKE A EXTRA CAPSULE DAILY FOR BREAKTHROUGH AFIB. 12/09/23   Marinus Maw, MD  dofetilide (TIKOSYN) 250 MCG capsule TAKE ONE CAPSULE BY MOUTH TWICE DAILY 03/28/24   Laurey Morale, MD  Dupilumab  (DUPIXENT) 300 MG/2ML SOAJ Inject 300 mg into the skin every 14 (fourteen) days. 11/09/23   Salena Saner, MD  Ensifentrine [facility]) 3 MG/2.5ML SUSP Take 3 mg by nebulization 2 (two) times daily. 10/26/23   Salena Saner, MD  EPINEPHrine 0.3 mg/0.3 mL IJ SOAJ injection Inject 0.3 mg into the muscle as needed for anaphylaxis. 11/05/20   Salena Saner, MD  fluticasone (FLONASE) 50 MCG/ACT nasal spray Place 2 sprays into both nostrils daily. 10/21/23   Read Drivers, MD  furosemide (LASIX) 40 MG tablet Take 1 tablet (40 mg total) by mouth 2 (two) times daily. 12/16/23   Laurey Morale, MD  glipiZIDE (GLUCOTROL XL) 5 MG 24 hr tablet Take 5 mg by mouth daily before breakfast.    [provider]  hydrocortisone cream 0.5 % Apply 1 Application topically daily as needed for itching.    [provider]  JARDIANCE 25 MG TABS tablet Take 25 mg by mouth daily before breakfast. 04/11/21   [provider]  levothyroxine (SYNTHROID) 150 MCG tablet Take 150 mcg by mouth daily before breakfast. 06/01/23   [provider]  losartan (COZAAR) 50 MG tablet Take 50 mg by mouth at bedtime. 04/27/20   [provider]  metFORMIN (GLUCOPHAGE-XR) 500 MG 24 hr tablet Take 500 mg by mouth in the morning and at bedtime.    [provider]  Multiple Vitamin (MULTIVITAMIN) tablet Take 1 tablet by mouth daily.    [provider]  omeprazole (PRILOSEC OTC) 20 MG tablet Take 20 mg by mouth every morning.    [provider]  PRESCRIPTION MEDICATION 2 (two) times daily. Thumper vest    [provider]  Respiratory Therapy Supplies (FLUTTER) DEVI Use as directed 10/10/19   Salena Saner, MD  rivaroxaban (XARELTO) 20 MG TABS tablet Take 20 mg by mouth at bedtime.    [provider]  sodium chloride (OCEAN) 0.65 % SOLN nasal spray Place 1 spray into both nostrils as needed for congestion.    [provider]    Family  History Family History  Problem Relation Age of Onset   Liver cancer Mother    Pancreatic cancer Mother    Colon cancer Mother    Hypertension Father    Stroke Father  Other Father 35       Sudden Cardiac death   Heart attack Father    Cancer Sister        brain   Diabetes Sister    Colon cancer Maternal Aunt    Colon polyps Neg Hx     Social History Social History   Tobacco Use   Smoking status: Former    Current packs/day: 0.00    Average packs/day: 3.0 packs/day for 26.0 years (77.9 ttl pk-yrs)    Types: Cigarettes    Start date: 02/20/1958    Quit date: 02/12/1984    Years since quitting: 40.1   Smokeless tobacco: Former    Types: Chew   Tobacco comments:    Former smoker 08/30/2021  Vaping Use   Vaping status: Never Used  Substance Use Topics   Alcohol use: Yes    Alcohol/week: 2.0 - 4.0 standard drinks of alcohol    Types: 1 - 2 Glasses of wine, 1 - 2 Cans of beer per week    Comment: 1 glass of wine or beer 3 times a week 12/26/22   Drug use: No     Allergies   Food, Iodinated contrast media, Shellfish allergy, Goat-derived products, Prednisone, Diclofenac sodium, Metformin and related, Vancomycin, and Voltaren [diclofenac sodium]   Review of Systems Review of Systems Per HPI  Physical Exam Triage Vital Signs ED Triage Vitals  Encounter Vitals Group     BP 04/06/24 1626 (!) 124/58     Systolic BP Percentile --      Diastolic BP Percentile --      Pulse Rate 04/06/24 1626 71     Resp 04/06/24 1626 (!) 26     Temp 04/06/24 1626 99 F (37.2 C)     Temp Source 04/06/24 1626 Oral     SpO2 04/06/24 1626 (!) 79 %     Weight --      Height --      Head Circumference --      Peak Flow --      Pain Score 04/06/24 1627 0     Pain Loc --      Pain Education --      Exclude from Growth Chart --    No data found.  Updated Vital Signs BP (!) 124/58 (BP Location: Right Arm)   Pulse 71   Temp 99 F (37.2 C) (Oral)   Resp (!) 26   SpO2 (!) 88%  Comment: UC oxygen tank  Visual Acuity Right Eye Distance:   Left Eye Distance:   Bilateral Distance:    Right Eye Near:   Left Eye Near:    Bilateral Near:     Physical Exam Vitals and nursing note reviewed.  Constitutional:      General: He is not in acute distress.    Appearance: Normal appearance. He is ill-appearing and diaphoretic. He is not toxic-appearing.  HENT:     Head: Normocephalic and atraumatic.     Right Ear: External ear normal.     Left Ear: External ear normal.     Nose: No congestion or rhinorrhea.     Mouth/Throat:     Mouth: Mucous membranes are moist.     Pharynx: Oropharynx is clear. No oropharyngeal exudate or posterior oropharyngeal erythema.  Eyes:     General: No scleral icterus.    Extraocular Movements: Extraocular movements intact.  Cardiovascular:     Rate and Rhythm: Normal rate and regular rhythm.  Pulmonary:  Effort: Tachypnea present. No respiratory distress.     Breath sounds: Decreased breath sounds present. No wheezing, rhonchi or rales.  Musculoskeletal:     Cervical back: Normal range of motion and neck supple.  Lymphadenopathy:     Cervical: No cervical adenopathy.  Skin:    General: Skin is warm.     Coloration: Skin is not jaundiced or pale.     Findings: No erythema or rash.  Neurological:     Mental Status: He is alert and oriented to person, place, and time.  Psychiatric:        Behavior: Behavior is cooperative.      UC Treatments / Results  Labs (all labs ordered are listed, but only abnormal results are displayed) Labs Reviewed - No data to display  EKG   Radiology No results found.  Procedures Procedures (including critical care time)  Medications Ordered in UC Medications - No data to display  Initial Impression / Assessment and Plan / UC Course  I have reviewed the triage vital signs and the nursing notes.  Pertinent labs & imaging results that were available during my care of the patient were  reviewed by me and considered in my medical decision making (see chart for details).   In triage, patient is hypoxic on 5 L by nasal cannula.  He has a low-grade fever, and is also tachypneic.  Blood pressure and heart rate are stable.  Given hypoxemia on home oxygen with clear lung sounds, I recommended further evaluation and management emergency room given chronicity of patient's illnesses.  I recommended transportation by EMS, however patient declines.  I educated patient on risks of transporting self to emergency room by private vehicle and patient understands risks and elects to proceed to drive himself to the emergency room by private vehicle.  Patient departed urgent care in stable condition.   Final Clinical Impressions(s) / UC Diagnoses   Final diagnoses:  Acute respiratory failure with hypoxia [facility])     Discharge Instructions      Please go directly to the ER for further evaluation and management of your symptoms   ED Prescriptions   None    PDMP not reviewed this encounter.   Valentino Nose, NP 04/06/24 1719

## 2024-04-06 NOTE — Discharge Instructions (Signed)
 Please go directly to the ER for further evaluation and management of your symptoms

## 2024-04-06 NOTE — H&P (Incomplete)
 History and Physical    Patient: Clarence Dawson ZOX:[id number] DOB: 01/22/1946 DOA: 04/06/2024 DOS: the patient was seen and examined on 04/06/2024 PCP: Carylon Perches, MD  Patient coming from: Home  Chief Complaint:  Chief Complaint  Patient presents with  . Shortness of Breath   HPI: Clarence Dawson is a 78 y.o. male with medical history significant of COPD, severe persistent asthma, paroxysmal atrial fibrillation, sinus node dysfunction status post PPM, chronic respiratory failure with hypoxia on 4 LPM of oxygen at baseline, OSA on CPAP, hypothyroidism, hyperlipidemia who presented to the emergency department due to fever and chills which started around 1 AM today and lasted till about 4 AM.  On waking up in the morning, he still felt sick with increased shortness of breath, so he went to an urgent care where he was noted to have an O2 sat of 88% on 4 L of oxygen and was asked to go to the ED for further evaluation and management.  ED Course:  In the emergency department, respiratory rate was 26/min, BP was 124/58, O2 sat was 88% on supplemental oxygen at 4 LPM, temperature 68F, other vital signs are within normal range.  Workup in the ED showed normal CBC except for leukocytosis.  BMP was normal except for sodium of 134 and blood glucose of 171, troponin x 2 was flat at 79, BNP 1,169 lactic acidosis.  Influenza A, B, SARS, 2, RSV was negative. Chest x-ray was suggestive of atelectasis or developing bronchopneumonia Patient was started on IV ceftriaxone and azithromycin, Tylenol was given.  Hospitalist was asked to admit patient for further evaluation and management.  Review of Systems: Review of systems as noted in the HPI. All other systems reviewed and are negative.   Past Medical History:  Diagnosis Date  . Allergic rhinitis   . Aortic valve disorder   . Asthma    since childhood- seasonal allergies induced  . Cancer (HCC)    Skin cancer- squamous, basal  . Carotid artery stenosis    . Essential hypertension   . Full dentures   . GERD (gastroesophageal reflux disease)   . H/O hiatal hernia   . Hemorrhage of rectum   . Hyperlipidemia   . Hypothyroidism   . 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 OSA (obstructive sleep apnea)   . Osteoarthritis   . Pacemaker    Oct 2005 in [place].  Marland Kitchen PAF (paroxysmal atrial fibrillation) (HCC)   . Pneumonia    "several Times" 2015 last time  . Primary localized osteoarthritis of right knee 08/11/2017  . RBBB (right bundle branch block)   . Sinoatrial node dysfunction (HCC)   . Syncope   . Tricuspid valve disorder   . Type 2 diabetes mellitus (HCC)    Type II   Past Surgical History:  Procedure Laterality Date  . A-V CARDIAC PACEMAKER INSERTION     Sick sinus syndrome DDR pacer  . Arthropathy Right 2005   Rebuilding of left thumb and joint   . CARDIAC CATHETERIZATION    . CARDIAC ELECTROPHYSIOLOGY STUDY AND ABLATION  09/2008   for pvcs, Dr. Vesta Mixer  . CARDIOVERSION N/A 12/18/2016   Procedure: CARDIOVERSION;  Surgeon: Chrystie Nose, MD;  Location: [facility] ENDOSCOPY;  Service: Cardiovascular;  Laterality: N/A;  . CARDIOVERSION N/A 09/05/2021   Procedure: CARDIOVERSION;  Surgeon: Sande Rives, MD;  Location: [facility] ENDOSCOPY;  Service: Cardiovascular;  Laterality: N/A;  . CARDIOVERSION N/A 08/06/2022   Procedure: CARDIOVERSION;  Surgeon: Little Ishikawa, MD;  Location: [facility] ENDOSCOPY;  Service: Cardiovascular;  Laterality: N/A;  . CARPAL TUNNEL RELEASE  1994   right wrist  . CARPAL TUNNEL RELEASE  05/04/2012   Procedure: CARPAL TUNNEL RELEASE;  Surgeon: Nicki Reaper, MD;  Location: [facility];  Service: Orthopedics;  Laterality: Left;  . CARPOMETACARPEL SUSPENSION PLASTY Right 11/16/2014   Procedure: SUSPENSIONPLASTY RIGHT THUMB TENDON TRANSFER ABDUCTOR POLLICUS LONGUS EXCISION TRAPEZIUM;  Surgeon: Cindee Salt, MD;  Location: [facility];  Service: Orthopedics;  Laterality: Right;  . CHOLECYSTECTOMY  1994  .  CIRCUMCISION    . COLONOSCOPY N/A 03/14/2013   Procedure: COLONOSCOPY;  Surgeon: Corbin Ade, MD;  [facility];  Service: Endoscopy;  Laterality: N/A;  8:15 AM  . EYE SURGERY     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  . EYE SURGERY  2012   Left eye Corneal transplant- partial- Cataract  . FLEXIBLE BRONCHOSCOPY Right 06/23/2019   Procedure: FLEXIBLE BRONCHOSCOPY RIGHT;  Surgeon: Shane Crutch, MD;  Location: [facility] ORS;  Service: Pulmonary;  Laterality: Right;  . GALLBLADDER SURGERY  12/01/2006  . HAND TENDON SURGERY Left late 1990's   thumb  . HEMORROIDECTOMY  2003  . INJECTION KNEE Left 08/26/2017   Procedure: LEFT KNEE INJECTION;  Surgeon: Salvatore Marvel, MD;  Location: [facility] OR;  Service: Orthopedics;  Laterality: Left;  . left Knee Arthroscopy     [facility]  . PARTIAL KNEE ARTHROPLASTY  11/22/2012   Procedure: UNICOMPARTMENTAL KNEE;  Surgeon: Nilda Simmer, MD;  Location: [facility] OR;  Service: Orthopedics;  Laterality: Left;  left unicompartmental knee arthroplasty  . PERMANENT PACEMAKER GENERATOR CHANGE N/A 01/12/2013   Procedure: PERMANENT PACEMAKER GENERATOR CHANGE;  Surgeon: Marinus Maw, MD;  Location: [facility] CATH LAB;  Service: Cardiovascular;  Laterality: N/A;  . PPM GENERATOR CHANGEOUT N/A 07/07/2022   Procedure: PPM GENERATOR CHANGEOUT;  Surgeon: Marinus Maw, MD;  [facility]: [facility] INVASIVE CV LAB;  Service: Cardiovascular;  Laterality: N/A;  . RIGHT/LEFT HEART CATH AND CORONARY ANGIOGRAPHY N/A 08/28/2023   Procedure: RIGHT/LEFT HEART CATH AND CORONARY ANGIOGRAPHY;  Surgeon: Laurey Morale, MD;  Location: [facility] INVASIVE CV LAB;  Service: Cardiovascular;  Laterality: N/A;  . Rotator cuff Surgery  2001   Right shoulder  . TONSILLECTOMY    . TOTAL KNEE ARTHROPLASTY Right 08/26/2017   Procedure: TOTAL KNEE ARTHROPLASTY;  Surgeon: Salvatore Marvel, MD;  Location: [facility] OR;  Service: Orthopedics;  Laterality: Right;  . varicose vein reduction       Social History:  reports that he quit smoking about 40 years ago. His smoking use included cigarettes. He started smoking about 66 years ago. He has a 77.9 pack-year smoking history. He has quit using smokeless tobacco.  His smokeless tobacco use included chew. He reports current alcohol use of about 2.0 - 4.0 standard drinks of alcohol per week. He reports that he does not use drugs.   Allergies  Allergen Reactions  . Food Anaphylaxis and Shortness Of Breath    TREE NUTS  . Iodinated Contrast Media Hives and Shortness Of Breath    Patient states hives to throat closing. (01/15/17: patient states this reaction was "about 20 years ago" with possibly an IVP.  He now says high doses of prednisone "throw me into AFib."  He has tolerated CT arthrograms with Benadrly 50mg  PO one hour before injection.  Donell Sievert, RN)  . Shellfish Allergy Anaphylaxis and Shortness Of Breath    To shellfish, crabs.  Makes  him feel like "things are crawling all over" me.  Denies airway issues with these foods.  Donell Sievert, RN 01/15/17)  . Goat-Derived Best boy   . Prednisone Palpitations    PRECIPITATES A-FIB  . Diclofenac Sodium Other (See Comments)    Hives, "buggy feeling all over"  . Metformin And Related Diarrhea    High doses at once  . Vancomycin Anxiety    Red man syndrome  . Voltaren [Diclofenac Sodium] Other (See Comments)    Feels like things are crawling on him    Family History  Problem Relation Age of Onset  . Liver cancer Mother   . Pancreatic cancer Mother   . Colon cancer Mother   . Hypertension Father   . Stroke Father   . Other Father 10       Sudden Cardiac death  . Heart attack Father   . Cancer Sister        brain  . Diabetes Sister   . Colon cancer Maternal Aunt   . Colon polyps Neg Hx     ***  Prior to Admission medications   Medication Sig Start Date End Date Taking? Authorizing Provider  acetaminophen (TYLENOL) 500 MG tablet Take 1,000 mg by  mouth 3 (three) times daily.   Yes [provider]  albuterol (PROVENTIL) (2.5 MG/3ML) 0.083% nebulizer solution Take 3 mLs (2.5 mg total) by nebulization every 6 (six) hours as needed for wheezing or shortness of breath. 03/09/24  Yes Salena Saner, MD  atorvastatin (LIPITOR) 40 MG tablet Take 1 tablet (40 mg total) by mouth daily. 04/06/24  Yes Laurey Morale, MD  bisoprolol (ZEBETA) 5 MG tablet TAKE 1 AND 1/2 TABLET BY MOUTH TWICE DAILY. 04/17/17  Yes Newman Nip, NP  BREZTRI AEROSPHERE 160-9-4.8 MCG/ACT AERO INHALE 2 PUFFS INTO THE LUNGS TWICE DAILY. 03/30/24  Yes Salena Saner, MD  Carboxymethylcellul-Glycerin (LUBRICATING EYE DROPS OP) Place 1 drop into the right eye 2 (two) times a week. Clear eyes as needed   Yes [provider]  diltiazem (CARDIZEM CD) 120 MG 24 hr capsule TAKE (1) CAPSULE BY MOUTH DAILY, MAY TAKE A EXTRA CAPSULE DAILY FOR BREAKTHROUGH AFIB. 12/09/23  Yes Marinus Maw, MD  dofetilide (TIKOSYN) 250 MCG capsule TAKE ONE CAPSULE BY MOUTH TWICE DAILY 03/28/24  Yes Laurey Morale, MD  Dupilumab (DUPIXENT) 300 MG/2ML SOAJ Inject 300 mg into the skin every 14 (fourteen) days. 11/09/23  Yes Salena Saner, MD  Ensifentrine [facility]) 3 MG/2.5ML SUSP Take 3 mg by nebulization 2 (two) times daily. 10/26/23  Yes Salena Saner, MD  EPINEPHrine 0.3 mg/0.3 mL IJ SOAJ injection Inject 0.3 mg into the muscle as needed for anaphylaxis. 11/05/20  Yes Salena Saner, MD  fluticasone (FLONASE) 50 MCG/ACT nasal spray Place 2 sprays into both nostrils daily. 10/21/23  Yes Jovita Kussmaul B, MD  furosemide (LASIX) 40 MG tablet Take 1 tablet (40 mg total) by mouth 2 (two) times daily. 12/16/23  Yes Laurey Morale, MD  glipiZIDE (GLUCOTROL XL) 5 MG 24 hr tablet Take 5 mg by mouth daily before breakfast.   Yes [provider]  JARDIANCE 25 MG TABS tablet Take 25 mg by mouth daily before breakfast. 04/11/21  Yes [provider]   levothyroxine (SYNTHROID) 150 MCG tablet Take 150 mcg by mouth daily before breakfast. 06/01/23  Yes [provider]  losartan (COZAAR) 50 MG tablet Take 50 mg by mouth at  bedtime. 04/27/20  Yes [provider]  metFORMIN (GLUCOPHAGE-XR) 500 MG 24 hr tablet Take 500 mg by mouth in the morning and at bedtime.   Yes [provider]  Multiple Vitamin (MULTIVITAMIN) tablet Take 1 tablet by mouth daily.   Yes [provider]  omeprazole (PRILOSEC OTC) 20 MG tablet Take 20 mg by mouth every morning.   Yes [provider]  PRESCRIPTION MEDICATION 2 (two) times daily. Thumper vest   Yes [provider]  rivaroxaban (XARELTO) 20 MG TABS tablet Take 20 mg by mouth at bedtime.   Yes [provider]  sodium chloride (OCEAN) 0.65 % SOLN nasal spray Place 1 spray into both nostrils as needed for congestion.   Yes [provider]  Respiratory Therapy Supplies (FLUTTER) DEVI Use as directed 10/10/19   Salena Saner, MD    Physical Exam: BP (!) 125/51   Pulse (!) 59   Temp (!) 97.4 F (36.3 C) (Oral)   Resp 18   Ht 5\' 9"  (1.753 m)   Wt 99.1 kg   SpO2 91%   BMI 32.25 kg/m   General: 78 y.o. year-old male well developed well nourished in no acute distress.  Alert and oriented x3. HEENT: NCAT, EOMI Neck: Supple, trachea medial Cardiovascular: Regular rate and rhythm with no rubs or gallops.  No thyromegaly or JVD noted.  No lower extremity edema. 2/4 pulses in all 4 extremities. Respiratory: Diffuse rhonchi and mild expiratory wheezes on auscultation. Abdomen: Soft, nontender, nondistended with normal bowel sounds x4 quadrants. Muskuloskeletal: No cyanosis, clubbing or edema noted bilaterally Neuro: CN II-XII intact, strength 5/5 x 4, sensation, reflexes intact Skin: No ulcerative lesions noted or rashes Psychiatry: Judgement and insight appear normal. Mood is appropriate for condition and setting          Labs on Admission:   Basic Metabolic Panel: Recent Labs  Lab 04/06/24 1732  NA 134*  K 4.0  CL 98  CO2 23  GLUCOSE 171*  BUN 20  CREATININE 1.18  CALCIUM 8.8*   Liver Function Tests: No results for input(s): "AST", "ALT", "ALKPHOS", "BILITOT", "PROT", "ALBUMIN" in the last 168 hours. No results for input(s): "LIPASE", "AMYLASE" in the last 168 hours. No results for input(s): "AMMONIA" in the last 168 hours. CBC: Recent Labs  Lab 04/06/24 1732  WBC 15.4*  NEUTROABS 13.0*  HGB 13.9  HCT 42.2  MCV 88.7  PLT 188   Cardiac Enzymes: No results for input(s): "CKTOTAL", "CKMB", "CKMBINDEX", "TROPONINI" in the last 168 hours.  BNP (last 3 results) Recent Labs    09/01/23 1200 12/16/23 1210 04/06/24 1732  BNP 225.8* 256.5* 1,169.0*    ProBNP (last 3 results) No results for input(s): "PROBNP" in the last 8760 hours.  CBG: No results for input(s): "GLUCAP" in the last 168 hours.  Radiological Exams on Admission: DG Chest Port 1 View Result Date: 04/06/2024 CLINICAL DATA:  sob, fever and chills EXAM: PORTABLE CHEST 1 VIEW COMPARISON:  March 15, 2024, April 09, 2023 FINDINGS: Flattening of the diaphragms with coarsening of the pulmonary interstitium, likely due to underlying emphysema. Retrocardiac airspace opacities. No pleural effusion or pneumothorax. Mild cardiomegaly. Left chest pacemaker with leads terminating in the right atrium right ventricle. No acute fracture or destructive lesions. Multilevel thoracic osteophytosis. IMPRESSION: Emphysema. Patchy retrocardiac airspace opacities, which may reflect atelectasis or developing bronchopneumonia, in the correct clinical context. Electronically Signed   By: Wallie Char M.D.   On: 04/06/2024 19:01    EKG:  I independently viewed the EKG done and my findings are as followed: Paced rhythm at a rate of 60 bpm with QTc of 564 ms  Assessment/Plan Present on Admission: . CAP (community acquired pneumonia)  Principal Problem:   CAP (community  acquired pneumonia)  Sepsis secondary to pneumonia  Patient met sepsis criteria due to having leukocytosis and tachypnea.  Source of infection was pneumonia. Patient was started on ceftriaxone and azithromycin, we shall continue same at this time with plan to de-escalate/discontinue based on blood culture, sputum culture, urine Legionella, strep pneumo and procalcitonin Continue Tylenol as needed Continue Mucinex, incentive spirometry, flutter valve   Lactic acidosis due to above Lactic acid 2.3 > 1.7, continue management as described above  Acute on chronic respiratory failure with hypoxia Patient is currently requiring 6 LPM of oxygen from 4 LPM (baseline). Continue to maintain O2 sat > 92% with plan to wean patient down to baseline oxygen requirement as tolerated.  COPD (not in acute exacerbation) Continue duo nebs, Mucinex, azithromycin. Continue incentive spirometry and flutter valve  Elevated BNP Elevated troponin possibly secondary to type II demand ischemia Prolonged QT interval   DVT prophylaxis: ***   Code Status: ***   Family Communication: ***   Disposition Plan: ***   Consults called: ***   Admission status: ***     Frankey Shown MD [facility] Pager [phone]  If 7PM-7AM, please contact night-coverage [email or web address] Password [facility]  04/06/2024, 11:17 PM       Review of Systems: {ROS_Text:26778} Past Medical History:  Diagnosis Date  . Allergic rhinitis   . Aortic valve disorder   . Asthma    since childhood- seasonal allergies induced  . Cancer (HCC)    Skin cancer- squamous, basal  . Carotid artery stenosis   . Essential hypertension   . Full dentures   . GERD (gastroesophageal reflux disease)   . H/O hiatal hernia   . Hemorrhage of rectum   . Hyperlipidemia   . Hypothyroidism   . 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 OSA (obstructive sleep apnea)   . Osteoarthritis   . Pacemaker    Oct 2005 in [place].  Marland Kitchen PAF (paroxysmal atrial  fibrillation) (HCC)   . Pneumonia    "several Times" 2015 last time  . Primary localized osteoarthritis of right knee 08/11/2017  . RBBB (right bundle branch block)   . Sinoatrial node dysfunction (HCC)   . Syncope   . Tricuspid valve disorder   . Type 2 diabetes mellitus (HCC)    Type II   Past Surgical History:  Procedure Laterality Date  . A-V CARDIAC PACEMAKER INSERTION     Sick sinus syndrome DDR pacer  . Arthropathy Right 2005   Rebuilding of left thumb and joint   . CARDIAC CATHETERIZATION    . CARDIAC ELECTROPHYSIOLOGY STUDY AND ABLATION  09/2008   for pvcs, Dr. Vesta Mixer  . CARDIOVERSION N/A 12/18/2016   Procedure: CARDIOVERSION;  Surgeon: Chrystie Nose, MD;  Location: [facility] ENDOSCOPY;  Service: Cardiovascular;  Laterality: N/A;  . CARDIOVERSION N/A 09/05/2021   Procedure: CARDIOVERSION;  Surgeon: Sande Rives, MD;  Location: [facility] ENDOSCOPY;  Service: Cardiovascular;  Laterality: N/A;  . CARDIOVERSION N/A 08/06/2022   Procedure: CARDIOVERSION;  Surgeon: Little Ishikawa, MD;  Location: [facility] ENDOSCOPY;  Service: Cardiovascular;  Laterality: N/A;  . CARPAL TUNNEL RELEASE  1994   right wrist  . CARPAL TUNNEL RELEASE  05/04/2012   Procedure: CARPAL TUNNEL RELEASE;  Surgeon: Nicki Reaper, MD;  Location: [facility];  Service: Orthopedics;  Laterality: Left;  . CARPOMETACARPEL SUSPENSION PLASTY Right 11/16/2014   Procedure: SUSPENSIONPLASTY RIGHT THUMB TENDON TRANSFER ABDUCTOR POLLICUS LONGUS EXCISION TRAPEZIUM;  Surgeon: Cindee Salt, MD;  Location: [facility];  Service: Orthopedics;  Laterality: Right;  . CHOLECYSTECTOMY  1994  . CIRCUMCISION    . COLONOSCOPY N/A 03/14/2013   Procedure: COLONOSCOPY;  Surgeon: Corbin Ade, MD;  [facility];  Service: Endoscopy;  Laterality: N/A;  8:15 AM  . EYE SURGERY     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  . EYE SURGERY  2012   Left eye Corneal transplant- partial- Cataract  . FLEXIBLE  BRONCHOSCOPY Right 06/23/2019   Procedure: FLEXIBLE BRONCHOSCOPY RIGHT;  Surgeon: Shane Crutch, MD;  Location: [facility] ORS;  Service: Pulmonary;  Laterality: Right;  . GALLBLADDER SURGERY  12/01/2006  . HAND TENDON SURGERY Left late 1990's   thumb  . HEMORROIDECTOMY  2003  . INJECTION KNEE Left 08/26/2017   Procedure: LEFT KNEE INJECTION;  Surgeon: Salvatore Marvel, MD;  Location: [facility] OR;  Service: Orthopedics;  Laterality: Left;  . left Knee Arthroscopy     [facility]  . PARTIAL KNEE ARTHROPLASTY  11/22/2012   Procedure: UNICOMPARTMENTAL KNEE;  Surgeon: Nilda Simmer, MD;  Location: [facility] OR;  Service: Orthopedics;  Laterality: Left;  left unicompartmental knee arthroplasty  . PERMANENT PACEMAKER GENERATOR CHANGE N/A 01/12/2013   Procedure: PERMANENT PACEMAKER GENERATOR CHANGE;  Surgeon: Marinus Maw, MD;  Location: [facility] CATH LAB;  Service: Cardiovascular;  Laterality: N/A;  . PPM GENERATOR CHANGEOUT N/A 07/07/2022   Procedure: PPM GENERATOR CHANGEOUT;  Surgeon: Marinus Maw, MD;  [facility] INVASIVE CV LAB;  Service: Cardiovascular;  Laterality: N/A;  . RIGHT/LEFT HEART CATH AND CORONARY ANGIOGRAPHY N/A 08/28/2023   Procedure: RIGHT/LEFT HEART CATH AND CORONARY ANGIOGRAPHY;  Surgeon: Laurey Morale, MD;  Location: [facility] INVASIVE CV LAB;  Service: Cardiovascular;  Laterality: N/A;  . Rotator cuff Surgery  2001   Right shoulder  . TONSILLECTOMY    . TOTAL KNEE ARTHROPLASTY Right 08/26/2017   Procedure: TOTAL KNEE ARTHROPLASTY;  Surgeon: Salvatore Marvel, MD;  Location: [facility] OR;  Service: Orthopedics;  Laterality: Right;  . varicose vein reduction     Social History:  reports that he quit smoking about 40 years ago. His smoking use included cigarettes. He started smoking about 66 years ago. He has a 77.9 pack-year smoking history. He has quit using smokeless tobacco.  His smokeless tobacco use included chew. He reports current alcohol use of about 2.0 - 4.0 standard drinks  of alcohol per week. He reports that he does not use drugs.  Allergies  Allergen Reactions  . Food Anaphylaxis and Shortness Of Breath    TREE NUTS  . Iodinated Contrast Media Hives and Shortness Of Breath    Patient states hives to throat closing. (01/15/17: patient states this reaction was "about 20 years ago" with possibly an IVP.  He now says high doses of prednisone "throw me into AFib."  He has tolerated CT arthrograms with Benadrly 50mg  PO one hour before injection.  Donell Sievert, RN)  . Shellfish Allergy Anaphylaxis and Shortness Of Breath    To shellfish, crabs.  Makes him feel like "things are crawling all over" me.  Denies airway issues with these foods.  Donell Sievert, RN 01/15/17)  . Goat-Derived Best boy   . Prednisone Palpitations    PRECIPITATES A-FIB  . Diclofenac  Sodium Other (See Comments)    Hives, "buggy feeling all over"  . Metformin And Related Diarrhea    High doses at once  . Vancomycin Anxiety    Red man syndrome  . Voltaren [Diclofenac Sodium] Other (See Comments)    Feels like things are crawling on him    Family History  Problem Relation Age of Onset  . Liver cancer Mother   . Pancreatic cancer Mother   . Colon cancer Mother   . Hypertension Father   . Stroke Father   . Other Father 12       Sudden Cardiac death  . Heart attack Father   . Cancer Sister        brain  . Diabetes Sister   . Colon cancer Maternal Aunt   . Colon polyps Neg Hx     Prior to Admission medications   Medication Sig Start Date End Date Taking? Authorizing Provider  acetaminophen (TYLENOL) 500 MG tablet Take 1,000 mg by mouth 3 (three) times daily.   Yes [provider]  albuterol (PROVENTIL) (2.5 MG/3ML) 0.083% nebulizer solution Take 3 mLs (2.5 mg total) by nebulization every 6 (six) hours as needed for wheezing or shortness of breath. 03/09/24  Yes Salena Saner, MD  atorvastatin (LIPITOR) 40 MG tablet Take 1 tablet (40 mg total) by mouth  daily. 04/06/24  Yes Laurey Morale, MD  bisoprolol (ZEBETA) 5 MG tablet TAKE 1 AND 1/2 TABLET BY MOUTH TWICE DAILY. 04/17/17  Yes Newman Nip, NP  BREZTRI AEROSPHERE 160-9-4.8 MCG/ACT AERO INHALE 2 PUFFS INTO THE LUNGS TWICE DAILY. 03/30/24  Yes Salena Saner, MD  Carboxymethylcellul-Glycerin (LUBRICATING EYE DROPS OP) Place 1 drop into the right eye 2 (two) times a week. Clear eyes as needed   Yes [provider]  diltiazem (CARDIZEM CD) 120 MG 24 hr capsule TAKE (1) CAPSULE BY MOUTH DAILY, MAY TAKE A EXTRA CAPSULE DAILY FOR BREAKTHROUGH AFIB. 12/09/23  Yes Marinus Maw, MD  dofetilide (TIKOSYN) 250 MCG capsule TAKE ONE CAPSULE BY MOUTH TWICE DAILY 03/28/24  Yes Laurey Morale, MD  Dupilumab (DUPIXENT) 300 MG/2ML SOAJ Inject 300 mg into the skin every 14 (fourteen) days. 11/09/23  Yes Salena Saner, MD  Ensifentrine [facility]) 3 MG/2.5ML SUSP Take 3 mg by nebulization 2 (two) times daily. 10/26/23  Yes Salena Saner, MD  EPINEPHrine 0.3 mg/0.3 mL IJ SOAJ injection Inject 0.3 mg into the muscle as needed for anaphylaxis. 11/05/20  Yes Salena Saner, MD  fluticasone (FLONASE) 50 MCG/ACT nasal spray Place 2 sprays into both nostrils daily. 10/21/23  Yes Jovita Kussmaul B, MD  furosemide (LASIX) 40 MG tablet Take 1 tablet (40 mg total) by mouth 2 (two) times daily. 12/16/23  Yes Laurey Morale, MD  glipiZIDE (GLUCOTROL XL) 5 MG 24 hr tablet Take 5 mg by mouth daily before breakfast.   Yes [provider]  JARDIANCE 25 MG TABS tablet Take 25 mg by mouth daily before breakfast. 04/11/21  Yes [provider]  levothyroxine (SYNTHROID) 150 MCG tablet Take 150 mcg by mouth daily before breakfast. 06/01/23  Yes [provider]  losartan (COZAAR) 50 MG tablet Take 50 mg by mouth at bedtime. 04/27/20  Yes [provider]  metFORMIN (GLUCOPHAGE-XR) 500 MG 24 hr tablet Take 500 mg by mouth in the morning and at bedtime.   Yes [provider]  Multiple Vitamin (MULTIVITAMIN) tablet Take 1 tablet by mouth daily.   Yes  [provider]  omeprazole (PRILOSEC OTC) 20 MG tablet Take 20 mg by mouth every morning.   Yes [provider]  PRESCRIPTION MEDICATION 2 (two) times daily. Thumper vest   Yes [provider]  rivaroxaban (XARELTO) 20 MG TABS tablet Take 20 mg by mouth at bedtime.   Yes [provider]  sodium chloride (OCEAN) 0.65 % SOLN nasal spray Place 1 spray into both nostrils as needed for congestion.   Yes [provider]  Respiratory Therapy Supplies (FLUTTER) DEVI Use as directed 10/10/19   Salena Saner, MD    Physical Exam: Vitals:   04/06/24 2115 04/06/24 2130 04/06/24 2145 04/06/24 2211  BP:    (!) 125/51  Pulse: 64 62 63 (!) 59  Resp: 14 13 16 18   Temp:    (!) 97.4 F (36.3 C)  TempSrc:    Oral  SpO2: 91% 95% 94% 91%  Weight:      Height:       *** Data Reviewed: {Tip this will not be part of the note when signed- Document your independent interpretation of telemetry tracing, EKG, lab, Radiology test or any other diagnostic tests. Add any new diagnostic test ordered today. (Optional):26781} {Results:26384}  Assessment and Plan: No notes have been filed under this hospital service. Service: Hospitalist     Advance Care Planning:   Code Status: Prior ***  Consults: ***  Family Communication: ***  Severity of Illness: {Observation/Inpatient:21159}  Author: Frankey Shown, DO 04/06/2024 10:57 PM  For on call review [email or web address].

## 2024-04-06 NOTE — ED Notes (Signed)
 Report called to Bonita Quin, Triage RN at [facility] ED.

## 2024-04-06 NOTE — Progress Notes (Signed)
   04/06/24 2209  TOC Brief Assessment  Insurance and Status Reviewed  Patient has primary care physician Yes  Home environment has been reviewed From home  Prior level of function: Independent  Prior/Current Home Services Current home services (O2)  Social Drivers of Health Review SDOH reviewed no interventions necessary  Readmission risk has been reviewed Yes  Transition of care needs no transition of care needs at this time   Transition of Care Department  [facility]) has reviewed patient and no other TOC needs have been identified at this time. We will continue to monitor patient advancement through interdisciplinary progression rounds. If new patient needs arise, please place a TOC consult.

## 2024-04-06 NOTE — ED Notes (Signed)
 Patient is being discharged from the Urgent Care and sent to the Emergency Department via POV . Per NP, patient is in need of higher level of care due to hypoxia. Patient is aware and verbalizes understanding of plan of care.  Vitals:   04/06/24 1631 04/06/24 1634  BP:    Pulse:    Resp:    Temp:    SpO2: (!) 89% (!) 88%   Pt declined EMS. Risks of driving and going POV reviewed by NP. Pt verbalized understanding and still elects to go POV.

## 2024-04-06 NOTE — ED Provider Notes (Signed)
 [place] EMERGENCY DEPARTMENT AT [facility] Provider Note   CSN: [id number] Arrival date & time: 04/06/24  1655     History  Chief Complaint  Patient presents with   Shortness of Breath    Clarence Dawson is a 78 y.o. male.  Pt is a 78 yo male with pmhx significant for syncope, hld, asthma, gerd, sleep apnea, htn, afib (on Xarelto), hypothyroidism, chronic resp failure (on 4L oxygen).  Pt has had fevers and chills since last night.  Pt said he woke up around 0100 and could not get warm.  That lasted until around 0400.  He's been sob ever since.  He went to UC and his O2 sat was 88% on 4L.  He was sent here for further eval.  Pt refused wheelchair and walked back to the room.  O2 sat 82% on 4L after walking.         Home Medications Prior to Admission medications   Medication Sig Start Date End Date Taking? Authorizing Provider  acetaminophen (TYLENOL) 500 MG tablet Take 1,000 mg by mouth 3 (three) times daily.    [provider]  albuterol (PROVENTIL) (2.5 MG/3ML) 0.083% nebulizer solution Take 3 mLs (2.5 mg total) by nebulization every 6 (six) hours as needed for wheezing or shortness of breath. 03/09/24   Salena Saner, MD  albuterol (VENTOLIN HFA) 108 (90 Base) MCG/ACT inhaler Inhale 2 puffs into the lungs every 4 (four) hours as needed for shortness of breath (only if you can't catch your breath/ asthma). 05/19/23   Salena Saner, MD  atorvastatin (LIPITOR) 40 MG tablet Take 1 tablet (40 mg total) by mouth daily. 04/06/24   Laurey Morale, MD  bisoprolol (ZEBETA) 5 MG tablet TAKE 1 AND 1/2 TABLET BY MOUTH TWICE DAILY. 04/17/17   Newman Nip, NP  BREZTRI AEROSPHERE 160-9-4.8 MCG/ACT AERO INHALE 2 PUFFS INTO THE LUNGS TWICE DAILY. 03/30/24   Salena Saner, MD  Carboxymethylcellul-Glycerin (LUBRICATING EYE DROPS OP) Place 1 drop into the right eye 2 (two) times a week. Clear eyes as needed    [provider]  diltiazem (CARDIZEM CD) 120  MG 24 hr capsule TAKE (1) CAPSULE BY MOUTH DAILY, MAY TAKE A EXTRA CAPSULE DAILY FOR BREAKTHROUGH AFIB. 12/09/23   Marinus Maw, MD  dofetilide (TIKOSYN) 250 MCG capsule TAKE ONE CAPSULE BY MOUTH TWICE DAILY 03/28/24   Laurey Morale, MD  Dupilumab (DUPIXENT) 300 MG/2ML SOAJ Inject 300 mg into the skin every 14 (fourteen) days. 11/09/23   Salena Saner, MD  Ensifentrine [facility]) 3 MG/2.5ML SUSP Take 3 mg by nebulization 2 (two) times daily. 10/26/23   Salena Saner, MD  EPINEPHrine 0.3 mg/0.3 mL IJ SOAJ injection Inject 0.3 mg into the muscle as needed for anaphylaxis. 11/05/20   Salena Saner, MD  fluticasone (FLONASE) 50 MCG/ACT nasal spray Place 2 sprays into both nostrils daily. 10/21/23   Read Drivers, MD  furosemide (LASIX) 40 MG tablet Take 1 tablet (40 mg total) by mouth 2 (two) times daily. 12/16/23   Laurey Morale, MD  glipiZIDE (GLUCOTROL XL) 5 MG 24 hr tablet Take 5 mg by mouth daily before breakfast.    [provider]  hydrocortisone cream 0.5 % Apply 1 Application topically daily as needed for itching.    [provider]  JARDIANCE 25 MG TABS tablet Take 25 mg by mouth daily before breakfast. 04/11/21   [provider]  levothyroxine (SYNTHROID) 150  MCG tablet Take 150 mcg by mouth daily before breakfast. 06/01/23   [provider]  losartan (COZAAR) 50 MG tablet Take 50 mg by mouth at bedtime. 04/27/20   [provider]  metFORMIN (GLUCOPHAGE-XR) 500 MG 24 hr tablet Take 500 mg by mouth in the morning and at bedtime.    [provider]  Multiple Vitamin (MULTIVITAMIN) tablet Take 1 tablet by mouth daily.    [provider]  omeprazole (PRILOSEC OTC) 20 MG tablet Take 20 mg by mouth every morning.    [provider]  PRESCRIPTION MEDICATION 2 (two) times daily. Thumper vest    [provider]  Respiratory Therapy Supplies (FLUTTER) DEVI Use as directed 10/10/19   Salena Saner,  MD  rivaroxaban (XARELTO) 20 MG TABS tablet Take 20 mg by mouth at bedtime.    [provider]  sodium chloride (OCEAN) 0.65 % SOLN nasal spray Place 1 spray into both nostrils as needed for congestion.    [provider]      Allergies    Food, Iodinated contrast media, Shellfish allergy, Goat-derived products, Prednisone, Diclofenac sodium, Metformin and related, Vancomycin, and Voltaren [diclofenac sodium]    Review of Systems   Review of Systems  Constitutional:  Positive for chills and fever.  Respiratory:  Positive for cough and shortness of breath.   All other systems reviewed and are negative.   Physical Exam Updated Vital Signs BP 109/60   Pulse 63   Temp 98.9 F (37.2 C) (Oral)   Resp 18   Ht 5\' 9"  (1.753 m)   Wt 99.1 kg   SpO2 94%   BMI 32.25 kg/m  Physical Exam Vitals and nursing note reviewed.  Constitutional:      General: He is in acute distress.     Appearance: Normal appearance. He is obese. He is ill-appearing.  HENT:     Head: Normocephalic and atraumatic.     Right Ear: External ear normal.     Left Ear: External ear normal.     Nose: Nose normal.     Mouth/Throat:     Mouth: Mucous membranes are dry.  Eyes:     Extraocular Movements: Extraocular movements intact.     Conjunctiva/sclera: Conjunctivae normal.     Pupils: Pupils are equal, round, and reactive to light.  Cardiovascular:     Rate and Rhythm: Normal rate and regular rhythm.     Pulses: Normal pulses.     Heart sounds: Normal heart sounds.  Pulmonary:     Effort: Tachypnea present.     Breath sounds: Rhonchi present.  Abdominal:     General: Abdomen is flat. Bowel sounds are normal.     Palpations: Abdomen is soft.  Musculoskeletal:        General: Normal range of motion.     Cervical back: Normal range of motion and neck supple.     Comments: R leg sl more swollen than the left (chronic per pt)  Skin:    General: Skin is warm.     Capillary Refill: Capillary  refill takes less than 2 seconds.  Neurological:     General: No focal deficit present.     Mental Status: He is alert and oriented to person, place, and time.  Psychiatric:        Mood and Affect: Mood normal.        Behavior: Behavior normal.     ED Results / Procedures / Treatments   Labs (all  labs ordered are listed, but only abnormal results are displayed) Labs Reviewed  BASIC METABOLIC PANEL WITH GFR - Abnormal; Notable for the following components:      Result Value   Sodium 134 (*)    Glucose, Bld 171 (*)    Calcium 8.8 (*)    All other components within normal limits  BRAIN NATRIURETIC PEPTIDE - Abnormal; Notable for the following components:   B Natriuretic Peptide 1,169.0 (*)    All other components within normal limits  CBC WITH DIFFERENTIAL/PLATELET - Abnormal; Notable for the following components:   WBC 15.4 (*)    Neutro Abs 13.0 (*)    Abs Immature Granulocytes 0.09 (*)    All other components within normal limits  LACTIC ACID, PLASMA - Abnormal; Notable for the following components:   Lactic Acid, Venous 2.3 (*)    All other components within normal limits  TROPONIN I (HIGH SENSITIVITY) - Abnormal; Notable for the following components:   Troponin I (High Sensitivity) 39 (*)    All other components within normal limits  RESP PANEL BY RT-PCR (RSV, FLU A&B, COVID)  [id number]  CULTURE, BLOOD (ROUTINE X 2)  CULTURE, BLOOD (ROUTINE X 2)  LACTIC ACID, PLASMA  TROPONIN I (HIGH SENSITIVITY)    EKG EKG Interpretation Date/Time:  Wednesday April 06 2024 17:55:17 EDT Ventricular Rate:  60 PR Interval:  153 QRS Duration:  159 QT Interval:  564 QTC Calculation: 564 R Axis:   104  Text Interpretation: paced rhythm Confirmed by Jacalyn Lefevre [phone]) on 04/06/2024 6:06:31 PM  Radiology DG Chest Port 1 View Result Date: 04/06/2024 CLINICAL DATA:  sob, fever and chills EXAM: PORTABLE CHEST 1 VIEW COMPARISON:  March 15, 2024, April 09, 2023 FINDINGS: Flattening of the  diaphragms with coarsening of the pulmonary interstitium, likely due to underlying emphysema. Retrocardiac airspace opacities. No pleural effusion or pneumothorax. Mild cardiomegaly. Left chest pacemaker with leads terminating in the right atrium right ventricle. No acute fracture or destructive lesions. Multilevel thoracic osteophytosis. IMPRESSION: Emphysema. Patchy retrocardiac airspace opacities, which may reflect atelectasis or developing bronchopneumonia, in the correct clinical context. Electronically Signed   By: Wallie Char M.D.   On: 04/06/2024 19:01    Procedures Procedures    Medications Ordered in ED Medications  azithromycin (ZITHROMAX) 500 mg in sodium chloride 0.9 % 250 mL IVPB (500 mg Intravenous New Bag/Given 04/06/24 2016)  ipratropium-albuterol (DUONEB) 0.5-2.5 (3) MG/3ML nebulizer solution 3 mL (3 mLs Nebulization Given 04/06/24 1721)  acetaminophen (TYLENOL) tablet 1,000 mg (1,000 mg Oral Given 04/06/24 1820)  sodium chloride 0.9 % bolus 1,000 mL (1,000 mLs Intravenous New Bag/Given 04/06/24 1926)  cefTRIAXone (ROCEPHIN) 2 g in sodium chloride 0.9 % 100 mL IVPB (0 g Intravenous Stopped 04/06/24 2012)    ED Course/ Medical Decision Making/ A&P                                 Medical Decision Making Amount and/or Complexity of Data Reviewed Labs: ordered. Radiology: ordered.  Risk OTC drugs. Prescription drug management. Decision regarding hospitalization.   This patient presents to the ED for concern of sob, this involves an extensive number of treatment options, and is a complaint that carries with it a high risk of complications and morbidity.  The differential diagnosis includes copd exac, chf, covid/flu/rsv, pna   Co morbidities that complicate the patient evaluation  syncope, hld, asthma, gerd, sleep apnea, htn, afib (on Xarelto),  hypothyroidism, chronic resp failure (on 4L oxygen)   Additional history obtained:  Additional history obtained from epic chart  review External records from outside source obtained and reviewed including UC   Lab Tests:  I Ordered, and personally interpreted labs.  The pertinent results include:  cbc with wbc elevated at 15.4, bmp nl other than glucose elevated at 171, bnp elevated at 1169, trop elevated at 39, lactic elevated at 2.3; covid/flu/rsv neg   Imaging Studies ordered:  I ordered imaging studies including cxr  I independently visualized and interpreted imaging which showed  Emphysema. Patchy retrocardiac airspace opacities, which may reflect  atelectasis or developing bronchopneumonia, in the correct clinical  context.   I agree with the radiologist interpretation   Cardiac Monitoring:  The patient was maintained on a cardiac monitor.  I personally viewed and interpreted the cardiac monitored which showed an underlying rhythm of: nsr   Medicines ordered and prescription drug management:  I ordered medication including ivfs/rocephin/zithromax  for sx  Reevaluation of the patient after these medicines showed that the patient improved I have reviewed the patients home medicines and have made adjustments as needed   Test Considered:  ct   Critical Interventions:  Oxygen/abx   Consultations Obtained:  I requested consultation with the hospitalist (Dr. Thomes Dinning),  and discussed lab and imaging findings as well as pertinent plan - he will admit   Problem List / ED Course:  CAP with resp failure and sepsis:   Pt is requiring 6L oxygen to keep O2 sats above 90%.  Pt is started on iv rocephin/zithromax.  Pt is given IVFs for his soft bp and bp is improving. COPD exacerbation:  duoneb given   Reevaluation:  After the interventions noted above, I reevaluated the patient and found that they have :improved   Social Determinants of Health:  Lives at home   Dispostion:  After consideration of the diagnostic results and the patients response to treatment, I feel that the patent would  benefit from admission.   CRITICAL CARE Performed by: Jacalyn Lefevre   Total critical care time: 30 minutes  Critical care time was exclusive of separately billable procedures and treating other patients.  Critical care was necessary to treat or prevent imminent or life-threatening deterioration.  Critical care was time spent personally by me on the following activities: development of treatment plan with patient and/or surrogate as well as nursing, discussions with consultants, evaluation of patient's response to treatment, examination of patient, obtaining history from patient or surrogate, ordering and performing treatments and interventions, ordering and review of laboratory studies, ordering and review of radiographic studies, pulse oximetry and re-evaluation of patient's condition.          Final Clinical Impression(s) / ED Diagnoses Final diagnoses:  Community acquired pneumonia, unspecified laterality  COPD exacerbation (HCC)  Sepsis with acute hypoxic respiratory failure without septic shock, due to unspecified organism [facility])    Rx / DC Orders ED Discharge Orders     None         Jacalyn Lefevre, MD 04/06/24 2030

## 2024-04-06 NOTE — ED Notes (Signed)
 NP at bedside.

## 2024-04-06 NOTE — Sepsis Progress Note (Signed)
 Elink monitoring for the code sepsis protocol.

## 2024-04-06 NOTE — ED Triage Notes (Signed)
 Pt reports chills, fever since last night.   Pt  also reports shortness of breath since waking up this am. Reports usually wears 4 liters [place] but reports has had to increase to 5 liters today. Moderate dyspnea noted in triage.

## 2024-04-07 ENCOUNTER — Inpatient Hospital Stay (HOSPITAL_COMMUNITY)

## 2024-04-07 DIAGNOSIS — R7989 Other specified abnormal findings of blood chemistry: Secondary | ICD-10-CM | POA: Insufficient documentation

## 2024-04-07 DIAGNOSIS — J449 Chronic obstructive pulmonary disease, unspecified: Secondary | ICD-10-CM | POA: Insufficient documentation

## 2024-04-07 DIAGNOSIS — I5031 Acute diastolic (congestive) heart failure: Secondary | ICD-10-CM | POA: Diagnosis not present

## 2024-04-07 DIAGNOSIS — J189 Pneumonia, unspecified organism: Secondary | ICD-10-CM | POA: Diagnosis not present

## 2024-04-07 DIAGNOSIS — A419 Sepsis, unspecified organism: Secondary | ICD-10-CM | POA: Diagnosis not present

## 2024-04-07 DIAGNOSIS — R9431 Abnormal electrocardiogram [ECG] [EKG]: Secondary | ICD-10-CM | POA: Insufficient documentation

## 2024-04-07 DIAGNOSIS — E872 Acidosis, unspecified: Secondary | ICD-10-CM | POA: Insufficient documentation

## 2024-04-07 DIAGNOSIS — I482 Chronic atrial fibrillation, unspecified: Secondary | ICD-10-CM | POA: Insufficient documentation

## 2024-04-07 DIAGNOSIS — E039 Hypothyroidism, unspecified: Secondary | ICD-10-CM | POA: Insufficient documentation

## 2024-04-07 LAB — GLUCOSE, CAPILLARY
Glucose-Capillary: 157 mg/dL — ABNORMAL HIGH (ref 70–99)
Glucose-Capillary: 173 mg/dL — ABNORMAL HIGH (ref 70–99)
Glucose-Capillary: 223 mg/dL — ABNORMAL HIGH (ref 70–99)
Glucose-Capillary: 261 mg/dL — ABNORMAL HIGH (ref 70–99)
Glucose-Capillary: 287 mg/dL — ABNORMAL HIGH (ref 70–99)

## 2024-04-07 LAB — COMPREHENSIVE METABOLIC PANEL WITH GFR
ALT: 14 U/L (ref 0–44)
AST: 12 U/L — ABNORMAL LOW (ref 15–41)
Albumin: 2.8 g/dL — ABNORMAL LOW (ref 3.5–5.0)
Alkaline Phosphatase: 54 U/L (ref 38–126)
Anion gap: 9 (ref 5–15)
BUN: 20 mg/dL (ref 8–23)
CO2: 27 mmol/L (ref 22–32)
Calcium: 8.3 mg/dL — ABNORMAL LOW (ref 8.9–10.3)
Chloride: 103 mmol/L (ref 98–111)
Creatinine, Ser: 1.15 mg/dL (ref 0.61–1.24)
GFR, Estimated: >60 mL/min (ref >60)
Glucose, Bld: 162 mg/dL — ABNORMAL HIGH (ref 70–99)
Potassium: 3.9 mmol/L (ref 3.5–5.1)
Sodium: 139 mmol/L (ref 135–145)
Total Bilirubin: 1.3 mg/dL — ABNORMAL HIGH (ref 0.0–1.2)
Total Protein: 6.1 g/dL — ABNORMAL LOW (ref 6.5–8.1)

## 2024-04-07 LAB — HEMOGLOBIN A1C
Hgb A1c MFr Bld: 7.3 % — ABNORMAL HIGH (ref 4.8–5.6)
Mean Plasma Glucose: 162.81 mg/dL

## 2024-04-07 LAB — ECHOCARDIOGRAM COMPLETE
AR max vel: 1.54 cm2
AV Area VTI: 1.51 cm2
AV Area mean vel: 1.72 cm2
AV Mean grad: 14 mmHg
AV Peak grad: 22.5 mmHg
Ao pk vel: 2.37 m/s
Area-P 1/2: 3.39 cm2
Height: 69 in
S' Lateral: 2.7 cm
Weight: 3647.29 [oz_av]

## 2024-04-07 LAB — CBC
HCT: 40.8 % (ref 39.0–52.0)
Hemoglobin: 13 g/dL (ref 13.0–17.0)
MCH: 29.1 pg (ref 26.0–34.0)
MCHC: 31.9 g/dL (ref 30.0–36.0)
MCV: 91.3 fL (ref 80.0–100.0)
Platelets: 165 10*3/uL (ref 150–400)
RBC: 4.47 MIL/uL (ref 4.22–5.81)
RDW: 13.5 % (ref 11.5–15.5)
WBC: 10.8 10*3/uL — ABNORMAL HIGH (ref 4.0–10.5)
nRBC: 0 % (ref 0.0–0.2)

## 2024-04-07 LAB — STREP PNEUMONIAE URINARY ANTIGEN: Strep Pneumo Urinary Antigen: NEGATIVE

## 2024-04-07 LAB — PROCALCITONIN: Procalcitonin: 0.77 ng/mL

## 2024-04-07 LAB — PHOSPHORUS: Phosphorus: 3.2 mg/dL (ref 2.5–4.6)

## 2024-04-07 LAB — MAGNESIUM: Magnesium: 2 mg/dL (ref 1.7–2.4)

## 2024-04-07 MED ORDER — ALPRAZOLAM 0.5 MG PO TABS
0.5000 mg | ORAL_TABLET | Freq: Three times a day (TID) | ORAL | Status: DC
  Administered 2024-04-07 – 2024-04-09 (×5): 0.5 mg via ORAL
  Filled 2024-04-07 (×5): qty 1

## 2024-04-07 MED ORDER — DOXYCYCLINE HYCLATE 100 MG PO TABS
100.0000 mg | ORAL_TABLET | Freq: Two times a day (BID) | ORAL | Status: DC
  Administered 2024-04-07 – 2024-04-09 (×5): 100 mg via ORAL
  Filled 2024-04-07 (×5): qty 1

## 2024-04-07 MED ORDER — LEVALBUTEROL HCL 1.25 MG/0.5ML IN NEBU
1.2500 mg | INHALATION_SOLUTION | Freq: Four times a day (QID) | RESPIRATORY_TRACT | Status: DC
  Administered 2024-04-07 – 2024-04-09 (×7): 1.25 mg via RESPIRATORY_TRACT
  Filled 2024-04-07 (×7): qty 0.5

## 2024-04-07 MED ORDER — POTASSIUM CHLORIDE CRYS ER 20 MEQ PO TBCR
20.0000 meq | EXTENDED_RELEASE_TABLET | Freq: Once | ORAL | Status: AC
  Administered 2024-04-07: 20 meq via ORAL
  Filled 2024-04-07: qty 1

## 2024-04-07 MED ORDER — RIVAROXABAN 20 MG PO TABS
20.0000 mg | ORAL_TABLET | Freq: Every day | ORAL | Status: DC
  Administered 2024-04-08: 20 mg via ORAL
  Filled 2024-04-07: qty 1

## 2024-04-07 MED ORDER — METHYLPREDNISOLONE SODIUM SUCC 40 MG IJ SOLR
40.0000 mg | Freq: Every day | INTRAMUSCULAR | Status: DC
  Administered 2024-04-07 – 2024-04-09 (×3): 40 mg via INTRAVENOUS
  Filled 2024-04-07 (×3): qty 1

## 2024-04-07 MED ORDER — INSULIN ASPART 100 UNIT/ML IJ SOLN
0.0000 [IU] | Freq: Three times a day (TID) | INTRAMUSCULAR | Status: DC
  Administered 2024-04-07 (×2): 3 [IU] via SUBCUTANEOUS
  Administered 2024-04-07: 8 [IU] via SUBCUTANEOUS
  Administered 2024-04-08: 15 [IU] via SUBCUTANEOUS
  Administered 2024-04-08: 8 [IU] via SUBCUTANEOUS
  Administered 2024-04-08: 3 [IU] via SUBCUTANEOUS
  Administered 2024-04-09: 8 [IU] via SUBCUTANEOUS
  Administered 2024-04-09: 5 [IU] via SUBCUTANEOUS

## 2024-04-07 MED ORDER — LOSARTAN POTASSIUM 50 MG PO TABS
50.0000 mg | ORAL_TABLET | Freq: Every day | ORAL | Status: DC
  Administered 2024-04-08: 50 mg via ORAL
  Filled 2024-04-07: qty 1

## 2024-04-07 MED ORDER — INSULIN ASPART 100 UNIT/ML IJ SOLN
0.0000 [IU] | Freq: Every day | INTRAMUSCULAR | Status: DC
  Administered 2024-04-07: 2 [IU] via SUBCUTANEOUS
  Administered 2024-04-07: 3 [IU] via SUBCUTANEOUS
  Administered 2024-04-08: 4 [IU] via SUBCUTANEOUS

## 2024-04-07 MED ORDER — GUAIFENESIN-DM 100-10 MG/5ML PO SYRP
10.0000 mL | ORAL_SOLUTION | Freq: Three times a day (TID) | ORAL | Status: DC
  Administered 2024-04-07 – 2024-04-09 (×5): 10 mL via ORAL
  Filled 2024-04-07 (×6): qty 10

## 2024-04-07 MED ORDER — BUDESONIDE 0.5 MG/2ML IN SUSP
0.5000 mg | Freq: Two times a day (BID) | RESPIRATORY_TRACT | Status: DC
  Administered 2024-04-07 – 2024-04-09 (×5): 0.5 mg via RESPIRATORY_TRACT
  Filled 2024-04-07 (×5): qty 2

## 2024-04-07 MED ORDER — IPRATROPIUM BROMIDE 0.02 % IN SOLN
0.5000 mg | Freq: Four times a day (QID) | RESPIRATORY_TRACT | Status: DC
  Administered 2024-04-07 – 2024-04-09 (×7): 0.5 mg via RESPIRATORY_TRACT
  Filled 2024-04-07 (×7): qty 2.5

## 2024-04-07 NOTE — Hospital Course (Addendum)
 Clarence Dawson is a 78 y.o. male with medical history significant of COPD, severe persistent asthma, paroxysmal atrial fibrillation, sinus node dysfunction status post PPM, chronic respiratory failure with hypoxia on 4 LPM of oxygen at baseline, OSA on CPAP, hypothyroidism, hyperlipidemia who presented to the emergency department due to fever and chills which started around 1 AM today and lasted till about 4 AM.  On waking up in the morning, he still felt sick with increased shortness of breath, so he went to an urgent care where he was noted to have an O2 sat of 88% on 4 L of oxygen and was asked to go to the ED for further evaluation and management.   ED Course:  RR 26/min, BP was 124/58, O2 sat was 88% on supplemental oxygen at 4 LPM, temperature 72F, other vital signs are within normal range.  Workup in the ED showed normal CBC except for leukocytosis.  BMP was normal except for sodium of 134 and blood glucose of 171, troponin x 2 was flat at 79, BNP 1,169 lactic acidosis.  Influenza A, B, SARS, 2, RSV was negative. Chest x-ray was suggestive of atelectasis or developing bronchopneumonia Patient was started on IV ceftriaxone and azithromycin, Tylenol was given.  Hospitalist was asked to admit patient for further evaluation and management.     Assessment & Plan:   Principal Problem:   Sepsis due to pneumonia [facility]) Active Problems:   Essential hypertension, benign   Type 2 diabetes mellitus (HCC)   OSA on CPAP   Acute on chronic respiratory failure with hypoxia (HCC)   Lactic acidosis   Elevated brain natriuretic peptide (BNP) level   COPD (chronic obstructive pulmonary disease) (HCC)   Elevated troponin   Prolonged QT interval   Atrial fibrillation, chronic (HCC)   Acquired hypothyroidism    Sepsis secondary to pneumonia  - stable now- still on 5 Ls   Blood pressure 136/62, pulse 82, temperature 98.9 F (37.2 C), Resp Rate 19,  SpO2 93% 5 L  POA: met sepsis criteria due to having  leukocytosis and tachypnea.  Source of infection was pneumonia.  - Will continue IV Abx of ceftriaxone and azithromycin,  - Will F/up with Blood culture, sputum culture, urine Legionella, strep pneumo  -Lactic acid 2.3 >> 1.7 -Procalcitonin 0.77 -WBC 10.8 - procalcitonin -Continue as needed Tylenol, mucolytics, incentive spirometer, flutter valve    Lactic acidosis due to above Lactic acid 2.3 > 1.7, continue management as described above   Acute on chronic respiratory failure with hypoxia Currently on 5 L of oxygen, satting 93% [id number] is currently requiring 6 LPM of oxygen from 4 LPM (baseline). Continue to maintain O2 sat > 92% with plan to wean patient down to baseline oxygen requirement as tolerated.   COPD (not in acute exacerbation) Continue albuterol, Mucinex, azithromycin. Continue incentive spirometry and flutter valve   Elevated BNP r/o CHF -Ruling out acute versus chronic heart failure BNP 1,169 >> Monitoring I's and O's, daily weight, -Obtaining 2D echocardiogram -Creatinine normal at 1.15    Elevated troponin possibly secondary to type II demand ischemia -Troponin remained flat denies any chest pain, no changes in EKG Troponin x 2 was flat at 39,    Prolonged QT interval QTc 564 ms, patient has a pacemaker Avoid QT prolonging drugs Magnesium level will be checked Repeat EKG in the morning   OSA on CPAP Continue CPAP   Chronic atrial fibrillation Continue Cardizem, bisoprolol, Xarelto, Tikosyn   Essential hypertension Continue losartan, bisoprolol,  Cardizem -Stable   Type 2 diabetes mellitus -Checking CBG q. ACHS, SSI coverage Glipizide, metformin -on hold   Acquired hypothyroidism Continue Synthroid   GERD Continue Prilosec

## 2024-04-07 NOTE — Progress Notes (Signed)
 Pt due for Tikosyn. AC informed RN that only 125 mcg 1 tablet available in hospital and 500mg  capsules. Pt aware. Family can bring in am or [facility] stated that courier could bring in am. MD advised no additional orders received.

## 2024-04-07 NOTE — Progress Notes (Signed)
 Mobility Specialist Progress Note:    04/07/24 0940  Mobility  Activity Ambulated with assistance in room;Stood at bedside;Transferred from bed to chair  Level of Assistance Modified independent, requires aide device or extra time  Assistive Device None  Distance Ambulated (ft) 10 ft  Range of Motion/Exercises Active;All extremities  Activity Response Tolerated well  Mobility Referral Yes  Mobility visit 1 Mobility  Mobility Specialist Start Time (ACUTE ONLY) 0920  Mobility Specialist Stop Time (ACUTE ONLY) 0940  Mobility Specialist Time Calculation (min) (ACUTE ONLY) 20 min   Pt received in bed, eager for mobility. ModI to stand and ambulate with no AD. Tolerated well, asx throughout. Left pt in chair, call bell in reach. All needs met.  Lawerance Bach Mobility Specialist Please contact via Special educational needs teacher or  Rehab office at [phone]

## 2024-04-07 NOTE — Progress Notes (Signed)
 PROGRESS NOTE    Patient: Clarence Dawson                            PCP: Carylon Perches, MD                    DOB: 1946-10-16            DOA: 04/06/2024 [id number]             DOS: 04/07/2024, 8:03 AM   LOS: 1 day   Date of Service: The patient was seen and examined on 04/07/2024  Subjective:   The patient was seen and examined this morning. Down from 6 to 5 L of oxygen, satting 93%,.. hemodynamically stable   Brief Narrative:   Clarence Dawson is a 78 y.o. male with medical history significant of COPD, severe persistent asthma, paroxysmal atrial fibrillation, sinus node dysfunction status post PPM, chronic respiratory failure with hypoxia on 4 LPM of oxygen at baseline, OSA on CPAP, hypothyroidism, hyperlipidemia who presented to the emergency department due to fever and chills which started around 1 AM today and lasted till about 4 AM.  On waking up in the morning, he still felt sick with increased shortness of breath, so he went to an urgent care where he was noted to have an O2 sat of 88% on 4 L of oxygen and was asked to go to the ED for further evaluation and management.   ED Course:  RR 26/min, BP was 124/58, O2 sat was 88% on supplemental oxygen at 4 LPM, temperature 74F, other vital signs are within normal range.  Workup in the ED showed normal CBC except for leukocytosis.  BMP was normal except for sodium of 134 and blood glucose of 171, troponin x 2 was flat at 79, BNP 1,169 lactic acidosis.  Influenza A, B, SARS, 2, RSV was negative. Chest x-ray was suggestive of atelectasis or developing bronchopneumonia Patient was started on IV ceftriaxone and azithromycin, Tylenol was given.  Hospitalist was asked to admit patient for further evaluation and management.     Assessment & Plan:   Principal Problem:   Sepsis due to pneumonia [facility]) Active Problems:   Essential hypertension, benign   Type 2 diabetes mellitus (HCC)   OSA on CPAP   Acute on chronic respiratory failure with  hypoxia (HCC)   Lactic acidosis   Elevated brain natriuretic peptide (BNP) level   COPD (chronic obstructive pulmonary disease) (HCC)   Elevated troponin   Prolonged QT interval   Atrial fibrillation, chronic (HCC)   Acquired hypothyroidism    Sepsis secondary to pneumonia  - stable now- still on 5 Ls ----satting 94% -Baseline O2 demand 4 L, on this admission up to 6 L  Blood pressure 136/62, pulse 82, temperature 98.9 F (37.2 C), Resp Rate 19,  SpO2 93% 5 L  POA: met sepsis criteria due to having leukocytosis and tachypnea.  Source of infection was pneumonia.  - Will continue IV Abx of ceftriaxone and azithromycin,  (Azithromycin discontinued due to QTc prolongation)  - Will F/up with Blood culture, sputum culture, urine Legionella, strep pneumo  -Lactic acid 2.3 >> 1.7 -Procalcitonin 0.77 -WBC 10.8 -Continue as needed Tylenol, mucolytics, incentive spirometer, flutter valve    Lactic acidosis due to above Lactic acid 2.3 > 1.7, continue management as described above   Acute on chronic respiratory failure with hypoxia Currently on 5 L of oxygen, satting 93% [id number]  is currently requiring 6 LPM of oxygen from 4 LPM (baseline). Continue to maintain O2 sat > 92% with plan to wean patient down to baseline oxygen requirement as tolerated.   COPD (not in acute exacerbation) Continue albuterol, Mucinex, azithromycin. Continue incentive spirometry and flutter valve   Elevated BNP r/o CHF -Ruling out acute versus chronic heart failure BNP 1,169 >> Monitoring I's and O's, daily weight, -Obtaining 2D echocardiogram -Creatinine normal at 1.15    Elevated troponin possibly secondary to type II demand ischemia -Troponin remained flat denies any chest pain, no changes in EKG Troponin x 2 was flat at 39,    Prolonged QT interval QTc 564 ms, patient has a pacemaker Avoid QT prolonging drugs Magnesium level will be checked Repeat EKG in the morning   OSA on  CPAP Continue CPAP   Chronic atrial fibrillation Continue Cardizem, bisoprolol, Xarelto, Tikosyn   Essential hypertension Continue losartan, bisoprolol, Cardizem -Stable   Type 2 diabetes mellitus -Checking CBG q. ACHS, SSI coverage Glipizide, metformin -on hold   Acquired hypothyroidism Continue Synthroid   GERD Continue Prilosec    --------------------------------------------------------------------------------------------------------------------------------------- Nutritional status:  The patient's BMI is: Body mass index is 33.66 kg/m. I agree with the assessment and plan as outlined ---------------------------------------------------------------------------------------------------------------------------------------- Cultures; Blood Cultures x 2 >>  Sputum Culture >>   ------------------------------------------------------------------------------------------------------------------------------------------------  DVT prophylaxis:  SCDs Start: 04/06/24 2349 rivaroxaban (XARELTO) tablet 20 mg   Code Status:   Code Status: Full Code  Family Communication: No family member present at bedside-  -Advance care planning has been discussed.   Admission status:   Status is: Inpatient Remains inpatient appropriate because: Needing IV antibiotics, close monitoring for acute respiratory failure, respiratory support.   Disposition: From  - home             Planning for discharge in 1-2 days: to home with home health  Procedures:   No admission procedures for hospital encounter.   Antimicrobials:  Anti-infectives (From admission, onward)    Start     Dose/Rate Route Frequency Ordered Stop   04/07/24 2200  doxycycline (VIBRAMYCIN) 100 mg in sodium chloride 0.9 % 250 mL IVPB        100 mg 125 mL/hr over 120 Minutes Intravenous Every 12 hours 04/06/24 2354     04/07/24 2000  azithromycin (ZITHROMAX) 500 mg in sodium chloride 0.9 % 250 mL IVPB  Status:  Discontinued         500 mg 250 mL/hr over 60 Minutes Intravenous Every 24 hours 04/06/24 2350 04/06/24 2354   04/07/24 1800  cefTRIAXone (ROCEPHIN) 2 g in sodium chloride 0.9 % 100 mL IVPB        2 g 200 mL/hr over 30 Minutes Intravenous Every 24 hours 04/06/24 2350 04/12/24 1759   04/06/24 1915  cefTRIAXone (ROCEPHIN) 2 g in sodium chloride 0.9 % 100 mL IVPB        2 g 200 mL/hr over 30 Minutes Intravenous Once 04/06/24 1910 04/06/24 2012   04/06/24 1915  azithromycin (ZITHROMAX) 500 mg in sodium chloride 0.9 % 250 mL IVPB        500 mg 250 mL/hr over 60 Minutes Intravenous  Once 04/06/24 1910 04/06/24 2149        Medication:   atorvastatin  40 mg Oral QPM   bisoprolol  5 mg Oral Daily   dextromethorphan-guaiFENesin  1 tablet Oral BID   diltiazem  120 mg Oral Daily   dofetilide  250 mcg Oral BID   insulin aspart  0-15 Units Subcutaneous TID WC   insulin aspart  0-5 Units Subcutaneous QHS   levothyroxine  150 mcg Oral Q0600   losartan  50 mg Oral QHS   pantoprazole  40 mg Oral Daily   rivaroxaban  20 mg Oral QHS    acetaminophen **OR** acetaminophen, albuterol, ondansetron **OR** ondansetron (ZOFRAN) IV   Objective:   Vitals:   04/06/24 2211 04/07/24 0150 04/07/24 0500 04/07/24 0524  BP: (!) 125/51 (!) 137/46  136/62  Pulse: (!) 59 60  82  Resp: 18 18  19   Temp: (!) 97.4 F (36.3 C) (!) 97.4 F (36.3 C)  98.9 F (37.2 C)  TempSrc: Oral   Oral  SpO2: 91% 92%  93%  Weight:   103.4 kg   Height:        Intake/Output Summary (Last 24 hours) at 04/07/2024 1478 Last data filed at 04/07/2024 2956 Gross per 24 hour  Intake 1250 ml  Output 850 ml  Net 400 ml   Filed Weights   04/06/24 1705 04/07/24 0500  Weight: 99.1 kg 103.4 kg     Physical examination:   Constitution:  Alert, cooperative, no distress,  Appears calm and comfortable  Psychiatric:   Normal and stable mood and affect, cognition intact,   HEENT:        Normocephalic, PERRL, otherwise with in Normal limits  Chest:          Chest symmetric Cardio vascular:  S1/S2, RRR, No murmure, No Rubs or Gallops  pulmonary: Clear to auscultation bilaterally, respirations unlabored, negative wheezes / crackles Abdomen: Soft, non-tender, non-distended, bowel sounds,no masses, no organomegaly Muscular skeletal: Limited exam - in bed, able to move all 4 extremities,   Neuro: CNII-XII intact. , normal motor and sensation, reflexes intact  Extremities: +1 pitting edema lower extremities, +2 pulses  Skin: Dry, warm to touch, negative for any Rashes, No open wounds Wounds: per nursing documentation   ------------------------------------------------------------------------------------------------------------------------------------------    LABs:     Latest Ref Rng & Units 04/07/2024    4:44 AM 04/06/2024    5:32 PM 12/16/2023   12:10 PM  CBC  WBC 4.0 - 10.5 K/uL 10.8  15.4  8.7   Hemoglobin 13.0 - 17.0 g/dL 21.3  08.6  57.8   Hematocrit 39.0 - 52.0 % 40.8  42.2  50.0   Platelets 150 - 400 K/uL 165  188  181       Latest Ref Rng & Units 04/07/2024    4:44 AM 04/06/2024    5:32 PM 03/15/2024   10:54 AM  CMP  Glucose 70 - 99 mg/dL 469  629  528   BUN 8 - 23 mg/dL 20  20  25    Creatinine 0.61 - 1.24 mg/dL 4.13  2.44  0.10   Sodium 135 - 145 mmol/L 139  134  143   Potassium 3.5 - 5.1 mmol/L 3.9  4.0  4.3   Chloride 98 - 111 mmol/L 103  98  105   CO2 22 - 32 mmol/L 27  23  26    Calcium 8.9 - 10.3 mg/dL 8.3  8.8  9.4   Total Protein 6.5 - 8.1 g/dL 6.1     Total Bilirubin 0.0 - 1.2 mg/dL 1.3     Alkaline Phos 38 - 126 U/L 54     AST 15 - 41 U/L 12     ALT 0 - 44 U/L 14          Micro Results Recent  Results (from the past 240 hours)  Culture, blood (routine x 2)     Status: None (Preliminary result)   Collection Time: 04/06/24  5:32 PM   Specimen: BLOOD  Result Value Ref Range Status   Specimen Description BLOOD RIGHT ANTECUBITAL  Final   Special Requests   Final    BOTTLES DRAWN AEROBIC AND ANAEROBIC Blood  Culture adequate volume   Culture   Final    NO GROWTH < 12 HOURS Performed at [facility], [address], [place], [place] [address]    Report Status PENDING  Incomplete  Culture, blood (routine x 2)     Status: None (Preliminary result)   Collection Time: 04/06/24  5:39 PM   Specimen: BLOOD  Result Value Ref Range Status   Specimen Description BLOOD RIGHT ANTECUBITAL  Final   Special Requests   Final    BOTTLES DRAWN AEROBIC AND ANAEROBIC Blood Culture adequate volume   Culture   Final    NO GROWTH < 12 HOURS Performed at [facility], [address], [place], [place] [address]    Report Status PENDING  Incomplete  Resp panel by RT-PCR (RSV, Flu A&B, Covid) Anterior Nasal Swab     Status: None   Collection Time: 04/06/24  6:18 PM   Specimen: Anterior Nasal Swab  Result Value Ref Range Status   SARS Coronavirus 2 by RT PCR NEGATIVE NEGATIVE Final    Comment: (NOTE) SARS-CoV-2 target nucleic acids are NOT DETECTED.  The SARS-CoV-2 RNA is generally detectable in upper respiratory specimens during the acute phase of infection. The lowest concentration of SARS-CoV-2 viral copies this assay can detect is 138 copies/mL. A negative result does not preclude SARS-Cov-2 infection and should not be used as the sole basis for treatment or other patient management decisions. A negative result may occur with  improper specimen collection/handling, submission of specimen other than nasopharyngeal swab, presence of viral mutation(s) within the areas targeted by this assay, and inadequate number of viral copies(<138 copies/mL). A negative result must be combined with clinical observations, patient history, and epidemiological information. The expected result is Negative.  Fact Sheet for Patients:  BloggerCourse.com  Fact Sheet for Healthcare Providers:  SeriousBroker.it  This test is no t yet approved or cleared by the [place] FDA  and  has been authorized for detection and/or diagnosis of SARS-CoV-2 by FDA under an Emergency Use Authorization (EUA). This EUA will remain  in effect (meaning this test can be used) for the duration of the COVID-19 declaration under Section 564(b)(1) of the Act, 21 U.S.C.section 360bbb-3(b)(1), unless the authorization is terminated  or revoked sooner.       Influenza A by PCR NEGATIVE NEGATIVE Final   Influenza B by PCR NEGATIVE NEGATIVE Final    Comment: (NOTE) The Xpert Xpress SARS-CoV-2/FLU/RSV plus assay is intended as an aid in the diagnosis of influenza from Nasopharyngeal swab specimens and should not be used as a sole basis for treatment. Nasal washings and aspirates are unacceptable for Xpert Xpress SARS-CoV-2/FLU/RSV testing.  Fact Sheet for Patients: BloggerCourse.com  Fact Sheet for Healthcare Providers: SeriousBroker.it  This test is not yet approved or cleared by the [place] FDA and has been authorized for detection and/or diagnosis of SARS-CoV-2 by FDA under an Emergency Use Authorization (EUA). This EUA will remain in effect (meaning this test can be used) for the duration of the COVID-19 declaration under Section 564(b)(1) of the Act, 21 U.S.C. section 360bbb-3(b)(1), unless the authorization is terminated or  revoked.     Resp Syncytial Virus by PCR NEGATIVE NEGATIVE Final    Comment: (NOTE) Fact Sheet for Patients: BloggerCourse.com  Fact Sheet for Healthcare Providers: SeriousBroker.it  This test is not yet approved or cleared by the [place] FDA and has been authorized for detection and/or diagnosis of SARS-CoV-2 by FDA under an Emergency Use Authorization (EUA). This EUA will remain in effect (meaning this test can be used) for the duration of the COVID-19 declaration under Section 564(b)(1) of the Act, 21 U.S.C. section 360bbb-3(b)(1),  unless the authorization is terminated or revoked.  Performed at [facility], [address] [place], [place] [address]     Radiology Reports DG Chest [place] 1 View Result Date: 04/06/2024 CLINICAL DATA:  sob, fever and chills EXAM: PORTABLE CHEST 1 VIEW COMPARISON:  March 15, 2024, April 09, 2023 FINDINGS: Flattening of the diaphragms with coarsening of the pulmonary interstitium, likely due to underlying emphysema. Retrocardiac airspace opacities. No pleural effusion or pneumothorax. Mild cardiomegaly. Left chest pacemaker with leads terminating in the right atrium right ventricle. No acute fracture or destructive lesions. Multilevel thoracic osteophytosis. IMPRESSION: Emphysema. Patchy retrocardiac airspace opacities, which may reflect atelectasis or developing bronchopneumonia, in the correct clinical context. Electronically Signed   By: Wallie Char M.D.   On: 04/06/2024 19:01    SIGNED: Kendell Bane, MD, FHM. FAAFP. [facility] - [facility] Time spent - 55 min.  In seeing, evaluating and examining the patient. Reviewing medical records, labs, drawn plan of care. [facility],  Pager (please use [email or web address] to page/ text) Please use Epic Secure Chat for non-urgent communication (7AM-7PM)  If 7PM-7AM, please contact night-coverage [email or web address], 04/07/2024, 8:03 AM

## 2024-04-07 NOTE — Progress Notes (Signed)
   04/07/24 2035  Chest Physiotherapy Tx  CPT Delivery Source Flutter valve  $ Expiratory Vibration Device Administration  Subsequent  CPT Duration 10  CPT Chest Site Full range  Post-Treatment Respirations 18  Cough Congested;Non-productive  Sputum Amount None  Sputum Color Other (Comment) (none at time of treatment)  Sputum Consistency Other (Comment) (none)  Sputum Specimen Source Spontaneous cough  Position Supine   Patient also did Incentive Spirometer after flutter.

## 2024-04-07 NOTE — Progress Notes (Signed)
   04/07/24 2318  BiPAP/CPAP/SIPAP  $ Non-Invasive Ventilator  Non-Invasive Vent Set Up  $ Face Mask Medium Yes  BiPAP/CPAP/SIPAP Pt Type Adult  BiPAP/CPAP/SIPAP DREAMSTATIOND  Mask Type Full face mask  Dentures removed? Not applicable  Respiratory Rate 18 breaths/min  IPAP 25 cmH20 (settings on sleep study: auto mode)  EPAP 8 cmH2O  Pressure Support 5.5 cmH20  Flow Rate 8 lpm  Patient Home Machine No  Patient Home Mask No  Patient Home Tubing No  Auto Titrate Yes  Device Plugged into RED Power Outlet Yes  BiPAP/CPAP /SiPAP Vitals  Pulse Rate 60  Resp 18  SpO2 93 %  Bilateral Breath Sounds Rhonchi;Diminished  MEWS Score/Color  MEWS Score 0  MEWS Score Color Green

## 2024-04-07 NOTE — Plan of Care (Signed)

## 2024-04-08 DIAGNOSIS — A419 Sepsis, unspecified organism: Secondary | ICD-10-CM | POA: Diagnosis not present

## 2024-04-08 DIAGNOSIS — J189 Pneumonia, unspecified organism: Secondary | ICD-10-CM | POA: Diagnosis not present

## 2024-04-08 LAB — GLUCOSE, CAPILLARY
Glucose-Capillary: 192 mg/dL — ABNORMAL HIGH (ref 70–99)
Glucose-Capillary: 268 mg/dL — ABNORMAL HIGH (ref 70–99)
Glucose-Capillary: 409 mg/dL — ABNORMAL HIGH (ref 70–99)
Glucose-Capillary: 341 mg/dL — ABNORMAL HIGH (ref 70–99)

## 2024-04-08 LAB — CBC
HCT: 38.8 % — ABNORMAL LOW (ref 39.0–52.0)
Hemoglobin: 12.4 g/dL — ABNORMAL LOW (ref 13.0–17.0)
MCH: 29 pg (ref 26.0–34.0)
MCHC: 32 g/dL (ref 30.0–36.0)
MCV: 90.9 fL (ref 80.0–100.0)
Platelets: 177 10*3/uL (ref 150–400)
RBC: 4.27 MIL/uL (ref 4.22–5.81)
RDW: 13.3 % (ref 11.5–15.5)
WBC: 7.5 10*3/uL (ref 4.0–10.5)
nRBC: 0 % (ref 0.0–0.2)

## 2024-04-08 LAB — BASIC METABOLIC PANEL WITH GFR
Anion gap: 7 (ref 5–15)
BUN: 23 mg/dL (ref 8–23)
CO2: 24 mmol/L (ref 22–32)
Calcium: 8.4 mg/dL — ABNORMAL LOW (ref 8.9–10.3)
Chloride: 104 mmol/L (ref 98–111)
Creatinine, Ser: 1.1 mg/dL (ref 0.61–1.24)
GFR, Estimated: >60 mL/min (ref >60)
Glucose, Bld: 221 mg/dL — ABNORMAL HIGH (ref 70–99)
Potassium: 4.6 mmol/L (ref 3.5–5.1)
Sodium: 135 mmol/L (ref 135–145)

## 2024-04-08 LAB — LEGIONELLA PNEUMOPHILA SEROGP 1 UR AG: L. pneumophila Serogp 1 Ur Ag: NEGATIVE

## 2024-04-08 MED ORDER — DOFETILIDE 125 MCG PO CAPS
250.0000 ug | ORAL_CAPSULE | Freq: Two times a day (BID) | ORAL | Status: DC
  Administered 2024-04-08 – 2024-04-09 (×2): 250 ug via ORAL
  Filled 2024-04-08 (×7): qty 2

## 2024-04-08 MED ORDER — INSULIN GLARGINE-YFGN 100 UNIT/ML ~~LOC~~ SOLN
12.0000 [IU] | Freq: Every day | SUBCUTANEOUS | Status: DC
  Administered 2024-04-08: 12 [IU] via SUBCUTANEOUS
  Filled 2024-04-08 (×2): qty 0.12

## 2024-04-08 MED ORDER — ARFORMOTEROL TARTRATE 15 MCG/2ML IN NEBU
15.0000 ug | INHALATION_SOLUTION | Freq: Two times a day (BID) | RESPIRATORY_TRACT | Status: DC
  Administered 2024-04-08 – 2024-04-09 (×3): 15 ug via RESPIRATORY_TRACT
  Filled 2024-04-08 (×3): qty 2

## 2024-04-08 NOTE — Plan of Care (Signed)
   Problem: Education: Goal: Knowledge of General Education information will improve Description Including pain rating scale, medication(s)/side effects and non-pharmacologic comfort measures Outcome: Progressing   Problem: Health Behavior/Discharge Planning: Goal: Ability to manage health-related needs will improve Outcome: Progressing

## 2024-04-08 NOTE — Progress Notes (Signed)
   04/08/24 2302  BiPAP/CPAP/SIPAP  BiPAP/CPAP/SIPAP Pt Type Adult  BiPAP/CPAP/SIPAP DREAMSTATIOND  Mask Type Full face mask  Dentures removed? Not applicable  Respiratory Rate 18 breaths/min  IPAP 25 cmH20  EPAP 8 cmH2O  Pressure Support 5.5 cmH20  Flow Rate 6 lpm  Patient Home Machine No  Patient Home Mask No  Patient Home Tubing No  Auto Titrate Yes  BiPAP/CPAP /SiPAP Vitals  Pulse Rate 61  Resp 18  SpO2 95 %  Bilateral Breath Sounds Rhonchi  MEWS Score/Color  MEWS Score 0  MEWS Score Color Green

## 2024-04-08 NOTE — Care Management Important Message (Signed)
 Important Message  Patient Details  Name: Clarence Dawson MRN: [id number] Date of Birth: 10/27/1946   Important Message Given:  Yes - Medicare IM     Corey Harold 04/08/2024, 3:05 PM

## 2024-04-08 NOTE — Progress Notes (Signed)
 Progress Note   Patient: Clarence Dawson XBJ:[id number] DOB: 1946-05-13 DOA: 04/06/2024     2 DOS: the patient was seen and examined on 04/08/2024   Brief hospital course: Clarence Dawson is a 79 y.o. male with medical history significant of COPD, severe persistent asthma, paroxysmal atrial fibrillation, sinus node dysfunction status post PPM, chronic respiratory failure with hypoxia on 4 LPM of oxygen at baseline, OSA on CPAP, hypothyroidism, hyperlipidemia who presented to the emergency department due to fever and chills which started around 1 AM today and lasted till about 4 AM.  On waking up in the morning, he still felt sick with increased shortness of breath, so he went to an urgent care where he was noted to have an O2 sat of 88% on 4 L of oxygen and was asked to go to the ED for further evaluation and management.   ED Course:  RR 26/min, BP was 124/58, O2 sat was 88% on supplemental oxygen at 4 LPM, temperature 67F, other vital signs are within normal range.  Workup in the ED showed normal CBC except for leukocytosis.  BMP was normal except for sodium of 134 and blood glucose of 171, troponin x 2 was flat at 79, BNP 1,169 lactic acidosis.  Influenza A, B, SARS, 2, RSV was negative. Chest x-ray was suggestive of atelectasis or developing bronchopneumonia Patient was started on IV ceftriaxone and azithromycin, Tylenol was given.  Hospitalist was asked to admit patient for further evaluation and management.  Assessment and Plan: Sepsis due to pneumonia  - Pulmicort/brovana bid  - IV solumedrol 40 mg daily  - IV ceftriaxone 2 g daily  - Doxycycline 100 mg PO q12  - Atrovent neb q6 hr  - Xopenex q6 hr  - Robitussin DM 10 mL PO q8hr   Acute on chronic resp failure with hypoxia  - Management as above  - Xanax 0.5 mg PO tid   Lactic acidosis  - Resolved   COPD  - Management as above  OSA on CPAP - CPAP continued in the hospital   HTN  - Bisoprolol 5 mg PO daily  - Losartan 50 mg PO  at bedtime  - Cardizem CD 120 mg PO daily  - Tikosyn 250 mcg PO bid   DM2 - Novolog SS tid and bedtime  - Lipitor 40 mg PO at bedtime   Hypothyroidism  - Synthroid 150 mcg PO daily   GERD  - Protonix 40 mg PO daily   10. Chronic afib  - Xarelto 20 mg PO at bedtime    Subjective: Pt seen and examined at the bedside. This morning he was coughing up sputum that was bloody. Hgb is stable. I suspect based on his resp exam today that he will likely be ready for discharge during the weekend. Continue with antibx and breathing tx.  Physical Exam: Vitals:   04/08/24 0347 04/08/24 0500 04/08/24 0735 04/08/24 0830  BP: (!) 114/54   127/67  Pulse: 60   66  Resp: 18     Temp: (!) 97.5 F (36.4 C)     TempSrc: Axillary     SpO2: 95%  96% 95%  Weight:  101.7 kg    Height:       Physical Exam Constitutional:      Appearance: He is well-developed.  HENT:     Head: Normocephalic.  Cardiovascular:     Rate and Rhythm: Normal rate and regular rhythm.  Pulmonary:     Breath sounds: Rhonchi  present.  Abdominal:     Palpations: Abdomen is soft.  Musculoskeletal:        General: Normal range of motion.     Cervical back: Neck supple.  Skin:    General: Skin is warm.  Neurological:     Mental Status: He is alert and oriented to person, place, and time.  Psychiatric:        Mood and Affect: Mood normal.     Disposition: Status is: Inpatient Remains inpatient appropriate because: antibx and breathing tx  Planned Discharge Destination: Home    Time spent: 35 minutes  Author: Baron Hamper , MD 04/08/2024 1:11 PM  For on call review [email or web address].

## 2024-04-08 NOTE — Inpatient Diabetes Management (Signed)
 Inpatient Diabetes Program Recommendations  AACE/ADA: New Consensus Statement on Inpatient Glycemic Control (2015)  Target Ranges:  Prepandial:   less than 140 mg/dL      Peak postprandial:   less than 180 mg/dL (1-2 hours)      Critically ill patients:  140 - 180 mg/dL    Latest Reference Range & Units 04/07/24 00:52 04/07/24 07:47 04/07/24 11:28 04/07/24 16:32 04/07/24 20:30  Glucose-Capillary 70 - 99 mg/dL 161 (H)  2 units Novolog  157 (H)  3 units Novolog  173 (H)  3 units Novolog  261 (H)  8 units Novolog  287 (H)  3 units Novolog   (H): Data is abnormally high  Latest Reference Range & Units 04/08/24 07:30 04/08/24 11:51  Glucose-Capillary 70 - 99 mg/dL 096 (H)  3 units Novolog  268 (H)  (H): Data is abnormally high   Admit with: Sepsis due to pneumonia/ Acute on chronic respiratory failure with hypoxia   History: DM  Home DM Meds: Jardiance 25 mg daily       Glipizide 5 mg daily       Metformin 500 mg BID  Current Orders: Novolog Moderate Correction Scale/ SSI (0-15 units) TID AC + HS     Getting Solumedrol 40 mg daily    MD- Please consider adding Novolog Meal Coverage while pt getting Steroids:  Novolog 4 units TID with meals HOLD if pt NPO HOLD if pt eats <50% meals    --Will follow patient during hospitalization--  Ambrose Finland RN, MSN, CDCES Diabetes Coordinator Inpatient Glycemic Control Team Team Pager: [phone] (8a-5p)

## 2024-04-08 NOTE — Progress Notes (Signed)
 Mobility Specialist Progress Note:    04/08/24 0925  Mobility  Activity Ambulated with assistance in hallway  Level of Assistance Modified independent, requires aide device or extra time  Assistive Device None  Distance Ambulated (ft) 100 ft  Range of Motion/Exercises Active;All extremities  Activity Response Tolerated well  Mobility Referral Yes  Mobility visit 1 Mobility  Mobility Specialist Start Time (ACUTE ONLY) [id number]  Mobility Specialist Stop Time (ACUTE ONLY) 0955  Mobility Specialist Time Calculation (min) (ACUTE ONLY) 30 min   Pt received in chair, agreeable to mobility. ModI to stand and ambulate with no AD. Tolerated well, SpO2 89-94% on 4L during ambulation. Returned pt to chair, all needs met.  [name] Mobility Specialist Please contact via Special educational needs teacher or  Rehab office at [phone]

## 2024-04-08 NOTE — Plan of Care (Signed)
  Problem: Health Behavior/Discharge Planning: Goal: Ability to manage health-related needs will improve Outcome: Progressing   Problem: Clinical Measurements: Goal: Ability to maintain clinical measurements within normal limits will improve Outcome: Progressing Goal: Respiratory complications will improve Outcome: Progressing   Problem: Activity: Goal: Risk for activity intolerance will decrease Outcome: Progressing   

## 2024-04-09 DIAGNOSIS — J189 Pneumonia, unspecified organism: Secondary | ICD-10-CM | POA: Diagnosis not present

## 2024-04-09 DIAGNOSIS — A419 Sepsis, unspecified organism: Secondary | ICD-10-CM | POA: Diagnosis not present

## 2024-04-09 LAB — COMPREHENSIVE METABOLIC PANEL WITH GFR
ALT: 18 U/L (ref 0–44)
AST: 16 U/L (ref 15–41)
Albumin: 2.7 g/dL — ABNORMAL LOW (ref 3.5–5.0)
Alkaline Phosphatase: 57 U/L (ref 38–126)
Anion gap: 10 (ref 5–15)
BUN: 29 mg/dL — ABNORMAL HIGH (ref 8–23)
CO2: 23 mmol/L (ref 22–32)
Calcium: 8.9 mg/dL (ref 8.9–10.3)
Chloride: 103 mmol/L (ref 98–111)
Creatinine, Ser: 1.02 mg/dL (ref 0.61–1.24)
GFR, Estimated: >60 mL/min (ref >60)
Glucose, Bld: 277 mg/dL — ABNORMAL HIGH (ref 70–99)
Potassium: 4 mmol/L (ref 3.5–5.1)
Sodium: 136 mmol/L (ref 135–145)
Total Bilirubin: 0.4 mg/dL (ref 0.0–1.2)
Total Protein: 6.4 g/dL — ABNORMAL LOW (ref 6.5–8.1)

## 2024-04-09 LAB — CBC
HCT: 38.8 % — ABNORMAL LOW (ref 39.0–52.0)
Hemoglobin: 12.7 g/dL — ABNORMAL LOW (ref 13.0–17.0)
MCH: 29.4 pg (ref 26.0–34.0)
MCHC: 32.7 g/dL (ref 30.0–36.0)
MCV: 89.8 fL (ref 80.0–100.0)
Platelets: 196 10*3/uL (ref 150–400)
RBC: 4.32 MIL/uL (ref 4.22–5.81)
RDW: 13.6 % (ref 11.5–15.5)
WBC: 10.5 10*3/uL (ref 4.0–10.5)
nRBC: 0 % (ref 0.0–0.2)

## 2024-04-09 LAB — GLUCOSE, CAPILLARY
Glucose-Capillary: 226 mg/dL — ABNORMAL HIGH (ref 70–99)
Glucose-Capillary: 286 mg/dL — ABNORMAL HIGH (ref 70–99)

## 2024-04-09 LAB — C-REACTIVE PROTEIN: CRP: 13.3 mg/dL — ABNORMAL HIGH (ref <1.0)

## 2024-04-09 LAB — MAGNESIUM: Magnesium: 2.2 mg/dL (ref 1.7–2.4)

## 2024-04-09 MED ORDER — AMOXICILLIN-POT CLAVULANATE 875-125 MG PO TABS: 1.0000 | ORAL_TABLET | Freq: Two times a day (BID) | ORAL | 0 refills | Status: AC

## 2024-04-09 MED ORDER — DOXYCYCLINE HYCLATE 100 MG PO TABS: 100.0000 mg | ORAL_TABLET | Freq: Two times a day (BID) | ORAL | 0 refills | Status: AC

## 2024-04-09 NOTE — Discharge Summary (Signed)
 Physician Discharge Summary   Patient: Clarence Dawson MRN: [id number] DOB: 1946/06/21  Admit date:     04/06/2024  Discharge date: 04/09/24  Discharge Physician: Mickle Albe    PCP: Artemisa Bile, MD      Discharge Diagnoses: Principal Problem:   Sepsis due to pneumonia [facility]) Active Problems:   Essential hypertension, benign   Type 2 diabetes mellitus (HCC)   OSA on CPAP   Acute on chronic respiratory failure with hypoxia (HCC)   Lactic acidosis   Elevated brain natriuretic peptide (BNP) level   COPD (chronic obstructive pulmonary disease) (HCC)   Elevated troponin   Prolonged QT interval   Atrial fibrillation, chronic (HCC)   Acquired hypothyroidism  Resolved Problems:   * No resolved hospital problems. [facility] Course:  Clarence Dawson is a 78 y.o. male with medical history significant of COPD, severe persistent asthma, paroxysmal atrial fibrillation, sinus node dysfunction status post PPM, chronic respiratory failure with hypoxia on 4 LPM of oxygen at baseline, OSA on CPAP, hypothyroidism, hyperlipidemia who presented to the emergency department due to fever and chills which started around 1 AM. On waking up in the morning, he still felt sick with increased shortness of breath, so he went to an urgent care where he was noted to have an O2 sat of 88% on 4 L of oxygen and was asked to go to the ED for further evaluation and management.   He was treated for sepsis due to PNA, acute on chronic resp failure with hypoxia & lactic acidosis. He received Pulmicort/brovana bid, IV solumedrol 40 mg daily, IV ceftriaxone 2 g daily, Doxycycline 100 mg PO q12, Atrovent neb q6 hr, Xopenex q6 hr and Robitussin DM 10 mL PO q8hr. His oxygen was slowly weaned back to his home baseline of 4L via [place]. WBC downtrended from 15.4 --> 10.5. He was feeling well enough to be discharged on 04/09/2024. He will go home with 4 more days of PO augmentin and PO doxycycline.   DISCHARGE MEDICATION: Allergies as  of 04/09/2024       Reactions   Food Anaphylaxis, Shortness Of Breath   TREE NUTS   Iodinated Contrast Media Hives, Shortness Of Breath   Patient states hives to throat closing. (01/15/17: patient states this reaction was "about 20 years ago" with possibly an IVP.  He now says high doses of prednisone "throw me into AFib."  He has tolerated CT arthrograms with Benadrly 50mg  PO one hour before injection.  Kevin Pellant, RN)   Shellfish Allergy Anaphylaxis, Shortness Of Breath   To shellfish, crabs.  Makes him feel like "things are crawling all over" me.  Denies airway issues with these foods.  Kevin Pellant, RN 01/15/17)   Goat-derived Products Hives   GOAT CHEESE   Prednisone Palpitations   PRECIPITATES A-FIB   Diclofenac Sodium Other (See Comments)   Hives, "buggy feeling all over"   Metformin And Related Diarrhea   High doses at once   Vancomycin Anxiety   Red man syndrome   Voltaren [diclofenac Sodium] Other (See Comments)   Feels like things are crawling on him        Medication List     TAKE these medications    acetaminophen 500 MG tablet Commonly known as: TYLENOL Take 1,000 mg by mouth 3 (three) times daily.   albuterol (2.5 MG/3ML) 0.083% nebulizer solution Commonly known as: PROVENTIL Take 3 mLs (2.5 mg total) by nebulization every 6 (six) hours as needed for wheezing  or shortness of breath.   amoxicillin-clavulanate 875-125 MG tablet Commonly known as: AUGMENTIN Take 1 tablet by mouth 2 (two) times daily for 4 days.   atorvastatin 40 MG tablet Commonly known as: LIPITOR Take 1 tablet (40 mg total) by mouth daily.   bisoprolol 5 MG tablet Commonly known as: ZEBETA TAKE 1 AND 1/2 TABLET BY MOUTH TWICE DAILY.   Breztri Aerosphere 160-9-4.8 MCG/ACT Aero inhaler Generic drug: budeson-glycopyrrolate-formoterol INHALE 2 PUFFS INTO THE LUNGS TWICE DAILY.   diltiazem 120 MG 24 hr capsule Commonly known as: CARDIZEM CD TAKE (1) CAPSULE BY MOUTH DAILY, MAY TAKE A  EXTRA CAPSULE DAILY FOR BREAKTHROUGH AFIB.   dofetilide 250 MCG capsule Commonly known as: TIKOSYN TAKE ONE CAPSULE BY MOUTH TWICE DAILY   doxycycline 100 MG tablet Commonly known as: VIBRA-TABS Take 1 tablet (100 mg total) by mouth every 12 (twelve) hours for 4 days.   Dupixent 300 MG/2ML Soaj Generic drug: Dupilumab Inject 300 mg into the skin every 14 (fourteen) days.   EPINEPHrine 0.3 mg/0.3 mL Soaj injection Commonly known as: EPI-PEN Inject 0.3 mg into the muscle as needed for anaphylaxis.   fluticasone 50 MCG/ACT nasal spray Commonly known as: FLONASE Place 2 sprays into both nostrils daily.   Flutter Devi Use as directed   furosemide 40 MG tablet Commonly known as: LASIX Take 1 tablet (40 mg total) by mouth 2 (two) times daily.   glipiZIDE 5 MG 24 hr tablet Commonly known as: GLUCOTROL XL Take 5 mg by mouth daily before breakfast.   Jardiance 25 MG Tabs tablet Generic drug: empagliflozin Take 25 mg by mouth daily before breakfast.   levothyroxine 150 MCG tablet Commonly known as: SYNTHROID Take 150 mcg by mouth daily before breakfast.   losartan 50 MG tablet Commonly known as: COZAAR Take 50 mg by mouth at bedtime.   LUBRICATING EYE DROPS OP Place 1 drop into the right eye 2 (two) times a week. Clear eyes as needed   metFORMIN 500 MG 24 hr tablet Commonly known as: GLUCOPHAGE-XR Take 500 mg by mouth in the morning and at bedtime.   multivitamin tablet Take 1 tablet by mouth daily.   Ohtuvayre  3 MG/2.5ML Susp Generic drug: Ensifentrine  Take 3 mg by nebulization 2 (two) times daily.   omeprazole 20 MG tablet Commonly known as: PRILOSEC OTC Take 20 mg by mouth every morning.   PRESCRIPTION MEDICATION 2 (two) times daily. Thumper vest   rivaroxaban 20 MG Tabs tablet Commonly known as: XARELTO Take 20 mg by mouth at bedtime.   sodium chloride 0.65 % Soln nasal spray Commonly known as: OCEAN Place 1 spray into both nostrils as needed for  congestion.        Discharge Exam: Filed Weights   04/07/24 0500 04/08/24 0500 04/09/24 0500  Weight: 103.4 kg 101.7 kg 101.5 kg   Physical Exam HENT:     Head: Normocephalic.  Cardiovascular:     Rate and Rhythm: Normal rate and regular rhythm.  Pulmonary:     Effort: Pulmonary effort is normal.  Abdominal:     Palpations: Abdomen is soft.  Musculoskeletal:        General: Normal range of motion.     Cervical back: Neck supple.  Skin:    General: Skin is warm.  Neurological:     Mental Status: He is alert and oriented to person, place, and time.  Psychiatric:        Mood and Affect: Mood normal.  Condition at discharge: fair  The results of significant diagnostics from this hospitalization (including imaging, microbiology, ancillary and laboratory) are listed below for reference.   Imaging Studies: ECHOCARDIOGRAM COMPLETE Result Date: 04/07/2024    ECHOCARDIOGRAM REPORT   Patient Name:   AIRON SAHNI Date of Exam: 04/07/2024 Medical Rec #:  [id number]       Height:       69.0 in Accession #:    [id number]      Weight:       228.0 lb Date of Birth:  1946/09/07       BSA:          2.184 m Patient Age:    78 years        BP:           136/62 mmHg Patient Gender: M               HR:           77 bpm. Exam Location:  [facility] Procedure: 2D Echo, Cardiac Doppler and Color Doppler (Both Spectral and Color            Flow Doppler were utilized during procedure). Indications:    CHF-Acute Diastolic I50.31  History:        Patient has prior history of Echocardiogram examinations, most                 recent 07/03/2023. COPD, Aortic Valve Disease; Risk                 Factors:Hypertension and Diabetes.  Sonographer:    Astrid Blamer Referring Phys: [id number] OLADAPO ADEFESO IMPRESSIONS  1. Left ventricular ejection fraction, by estimation, is 65 to 70%. The left ventricle has normal function. The left ventricle has no regional wall motion abnormalities. Left ventricular diastolic  parameters are consistent with Grade II diastolic dysfunction (pseudonormalization). There is the interventricular septum is flattened in systole and diastole, consistent with right ventricular pressure and volume overload.  2. Right ventricular systolic function is severely reduced. The right ventricular size is moderately enlarged. There is severely elevated pulmonary artery systolic pressure. The estimated right ventricular systolic pressure is 61.6 mmHg.  3. Left atrial size was moderately dilated.  4. Right atrial size was moderately dilated.  5. The mitral valve is grossly normal. Mild mitral valve regurgitation.  6. Tricuspid valve regurgitation is mild to moderate.  7. The aortic valve was not well visualized. There is moderate calcification of the aortic valve. Aortic valve regurgitation is not visualized. Mild aortic valve stenosis. Aortic valve area, by VTI measures 1.51 cm. Aortic valve mean gradient measures 14.0 mmHg.  8. The inferior vena cava is normal in size with <50% respiratory variability, suggesting right atrial pressure of 8 mmHg. Comparison(s): Prior images reviewed side by side. LVEF vigorous at 65-70% with moderate diastolic dysfunction. Severe RV dysfunction with severely elevated estimated RVSP. FINDINGS  Left Ventricle: Left ventricular ejection fraction, by estimation, is 65 to 70%. The left ventricle has normal function. The left ventricle has no regional wall motion abnormalities. The left ventricular internal cavity size was normal in size. There is  borderline left ventricular hypertrophy. The interventricular septum is flattened in systole and diastole, consistent with right ventricular pressure and volume overload. Left ventricular diastolic parameters are consistent with Grade II diastolic dysfunction (pseudonormalization). Right Ventricle: The right ventricular size is moderately enlarged. No increase in right ventricular wall thickness. Right ventricular systolic function is  severely reduced. There is  severely elevated pulmonary artery systolic pressure. The tricuspid regurgitant velocity is 3.66 m/s, and with an assumed right atrial pressure of 8 mmHg, the estimated right ventricular systolic pressure is 61.6 mmHg. Left Atrium: Left atrial size was moderately dilated. Right Atrium: Right atrial size was moderately dilated. Pericardium: Trivial pericardial effusion is present. The pericardial effusion is anterior to the right ventricle. Presence of epicardial fat layer. Mitral Valve: The mitral valve is grossly normal. Mild mitral valve regurgitation. Tricuspid Valve: The tricuspid valve is grossly normal. Tricuspid valve regurgitation is mild to moderate. Aortic Valve: The aortic valve was not well visualized. There is moderate calcification of the aortic valve. Aortic valve regurgitation is not visualized. Mild aortic stenosis is present. Aortic valve mean gradient measures 14.0 mmHg. Aortic valve peak gradient measures 22.5 mmHg. Aortic valve area, by VTI measures 1.51 cm. Pulmonic Valve: The pulmonic valve was not well visualized. Pulmonic valve regurgitation is trivial. Aorta: The aortic root is normal in size and structure. Venous: The inferior vena cava is normal in size with less than 50% respiratory variability, suggesting right atrial pressure of 8 mmHg. IAS/Shunts: No atrial level shunt detected by color flow Doppler. Additional Comments: 3D was performed not requiring image post processing on an independent workstation and was indeterminate. A device lead is visualized.  LEFT VENTRICLE PLAX 2D LVIDd:         4.90 cm   Diastology LVIDs:         2.70 cm   LV e' medial:    4.79 cm/s LV PW:         1.00 cm   LV E/e' medial:  22.3 LV IVS:        0.90 cm   LV e' lateral:   10.30 cm/s LVOT diam:     2.00 cm   LV E/e' lateral: 10.4 LV SV:         71 LV SV Index:   33 LVOT Area:     3.14 cm  RIGHT VENTRICLE RV Basal diam:  5.30 cm RV Mid diam:    4.30 cm RV S prime:     16.10 cm/s  TAPSE (M-mode): 2.6 cm LEFT ATRIUM             Index        RIGHT ATRIUM           Index LA Vol (A2C):   89.4 ml 40.93 ml/m  RA Area:     18.50 cm LA Vol (A4C):   80.6 ml 36.90 ml/m  RA Volume:   46.20 ml  21.15 ml/m LA Biplane Vol: 91.1 ml 41.71 ml/m  AORTIC VALVE AV Area (Vmax):    1.54 cm AV Area (Vmean):   1.72 cm AV Area (VTI):     1.51 cm AV Vmax:           237.00 cm/s AV Vmean:          178.000 cm/s AV VTI:            0.470 m AV Peak Grad:      22.5 mmHg AV Mean Grad:      14.0 mmHg LVOT Vmax:         116.00 cm/s LVOT Vmean:        97.700 cm/s LVOT VTI:          0.226 m LVOT/AV VTI ratio: 0.48  AORTA Ao Root diam: 2.80 cm MITRAL VALVE  TRICUSPID VALVE MV Area (PHT): 3.39 cm     TR Peak grad:   53.6 mmHg MV Decel Time: 224 msec     TR Vmax:        366.00 cm/s MV E velocity: 107.00 cm/s MV A velocity: 63.80 cm/s   SHUNTS MV E/A ratio:  1.68         Systemic VTI:  0.23 m                             Systemic Diam: 2.00 cm Teddie Favre MD Electronically signed by Teddie Favre MD Signature Date/Time: 04/07/2024/11:19:27 AM    Final    DG Chest Port 1 View Result Date: 04/06/2024 CLINICAL DATA:  sob, fever and chills EXAM: PORTABLE CHEST 1 VIEW COMPARISON:  March 15, 2024, April 09, 2023 FINDINGS: Flattening of the diaphragms with coarsening of the pulmonary interstitium, likely due to underlying emphysema. Retrocardiac airspace opacities. No pleural effusion or pneumothorax. Mild cardiomegaly. Left chest pacemaker with leads terminating in the right atrium right ventricle. No acute fracture or destructive lesions. Multilevel thoracic osteophytosis. IMPRESSION: Emphysema. Patchy retrocardiac airspace opacities, which may reflect atelectasis or developing bronchopneumonia, in the correct clinical context. Electronically Signed   By: Rance Burrows M.D.   On: 04/06/2024 19:01   CT LUMBAR SPINE WO CONTRAST Result Date: 04/06/2024 CLINICAL DATA:  Lumbar back pain. EXAM: CT LUMBAR SPINE  WITHOUT CONTRAST TECHNIQUE: Multidetector CT imaging of the lumbar spine was performed without intravenous contrast administration. Multiplanar CT image reconstructions were also generated. RADIATION DOSE REDUCTION: This exam was performed according to the departmental dose-optimization program which includes automated exposure control, adjustment of the mA and/or kV according to patient size and/or use of iterative reconstruction technique. COMPARISON:  CT lumbar spine 04/25/2016 FINDINGS: Segmentation: There are 5 non-rib-bearing lumbar-type vertebral bodies. Alignment: Minimal 2 mm retrolisthesis of L3 on L4 is mildly increased from prior and likely secondary to degenerative disc changes. Vertebrae: Vertebral body heights are maintained. Severe anterior L1-L2 disc space narrowing with bone-on-bone contact is worsened from prior. Moderate to large anterior L1-2 endplate osteophytes. Mild posterior L2-3 and L3-4 disc space narrowing. Degenerative vacuum phenomenon within the T12-L1 and L2-3 through L4-5 disc levels, new L3-4 and L4-5. Mild vacuum phenomenon within the right L5-S1 disc space with mild right L5-S1 disc space narrowing. Moderate right and mild left L5-S1 and mild-to-moderate right and left L2-3 through L4-5 endplate osteophytes. Complete bridging osteophytes on the right at L1-2. Large right and left T12-L1 bridging osteophytes. Large anterior T12-L1 through L3-4 endplate osteophytes. No acute fracture. 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 Complete right and partial left bridging osteophytes of the visualized superior aspect of the bilateral sacroiliac joints. Paraspinal and other soft tissues: Moderate to high-grade atherosclerotic calcifications. Dense splenic artery atherosclerotic calcifications. Disc levels: T11-12: No posterior disc bulge. Facet joint hypertrophy contributes to moderate bilateral  neuroforaminal stenosis. T12-L1:: Mild-to-moderate bilateral facet joint hypertrophy. Mild posterior endplate ridging. No central canal stenosis. No significant neuroforaminal stenosis. L1-2: Moderate bilateral facet joint hypertrophy. Mild broad-based posterior disc osteophyte complex with moderate right and mild left intraforaminal endplate spurring. Moderate right and mild to moderate left neuroforaminal stenosis, mildly worsened from prior. New early partial fusion of the right posterior L1-2 endplate (sagittal series 6, image 28). No central canal stenosis. L2-3: Moderate bilateral facet joint hypertrophy. Mild broad-based posterior disc osteophyte complex with mild  bilateral intraforaminal endplate spurring. Mild central canal stenosis. Mild left greater than right neuroforaminal narrowing. L3-4: Moderate bilateral facet joint hypertrophy. Minimal anterolisthesis. Moderate broad-based posterior disc osteophyte complex with left and right intraforaminal extension. Likely mild-to-moderate left greater than right neuroforaminal stenosis, minimally worsened from prior. Likely moderate central canal stenosis. L4-5: Mild-to-moderate bilateral facet joint hypertrophy. Mild broad-based posterior disc osteophyte complex with bilateral intraforaminal extension. Moderate ligamentum flavum thickening. Moderate to severe central canal stenosis is similar to prior. No significant neuroforaminal stenosis. L5-S1: Moderate bilateral facet joint hypertrophy. Mild posterior endplate spurring with moderate right intraforaminal endplate spurring, similar to prior. Moderate neuroforaminal stenosis, unchanged. Very mild left neuroforaminal narrowing. No central canal stenosis. IMPRESSION: Compared to 04/25/2016: 1. Severe anterior L1-L2 disc space narrowing with bone-on-bone contact is worsened from prior. Moderate right and mild to moderate left L1-2 neuroforaminal stenosis, mildly worsened from prior. 2. L2-3 mild central canal  stenosis and mild left greater than right neuroforaminal narrowing. 3. L3-4 moderate central canal stenosis and mild-to-moderate left greater than right neuroforaminal stenosis, minimally worsened from prior. 4. L4-5 moderate to severe central canal stenosis, similar to prior. 5. L5-S1 moderate right neuroforaminal stenosis, unchanged. Electronically Signed   By: Bertina Broccoli M.D.   On: 04/06/2024 17:04   DG Chest 2 View Result Date: 03/29/2024 CLINICAL DATA:  78 year old male with shortness of breath EXAM: CHEST - 2 VIEW COMPARISON:  05/12/2023 FINDINGS: Cardiomediastinal silhouette unchanged in size and contour. New interlobular septal thickening. Mild fullness in the central pulmonary vasculature. Unchanged left chest wall pacing device with 2 leads in place. No large pleural effusion. No confluent airspace disease or pneumothorax. Degenerative changes spine.  No displaced fracture IMPRESSION: Early pulmonary edema is suspected. Electronically Signed   By: Myrlene Asper D.O.   On: 03/29/2024 13:57   LONG TERM MONITOR (3-14 DAYS) Result Date: 03/22/2024 NSR with periods of accelerated junctional rhythm (60 - 136/min), Rare PVC's and PAC's (<1%) NS SVT, longest 12 beats at 136/min No symptoms recorded    Microbiology: Results for orders placed or performed during the hospital encounter of 04/06/24  Culture, blood (routine x 2)     Status: None (Preliminary result)   Collection Time: 04/06/24  5:32 PM   Specimen: BLOOD  Result Value Ref Range Status   Specimen Description BLOOD RIGHT ANTECUBITAL  Final   Special Requests   Final    BOTTLES DRAWN AEROBIC AND ANAEROBIC Blood Culture adequate volume   Culture   Final    NO GROWTH 3 DAYS Performed at [facility], [address], [place], [place] [address]    Report Status PENDING  Incomplete  Culture, blood (routine x 2)     Status: None (Preliminary result)   Collection Time: 04/06/24  5:39 PM   Specimen: BLOOD  Result Value Ref Range Status    Specimen Description BLOOD RIGHT ANTECUBITAL  Final   Special Requests   Final    BOTTLES DRAWN AEROBIC AND ANAEROBIC Blood Culture adequate volume   Culture   Final    NO GROWTH 3 DAYS Performed at [facility], [address], [place], [place] [address]    Report Status PENDING  Incomplete  Resp panel by RT-PCR (RSV, Flu A&B, Covid) Anterior Nasal Swab     Status: None   Collection Time: 04/06/24  6:18 PM   Specimen: Anterior Nasal Swab  Result Value Ref Range Status   SARS Coronavirus 2 by RT PCR NEGATIVE NEGATIVE Final    Comment: (NOTE) SARS-CoV-2 target  nucleic acids are NOT DETECTED.  The SARS-CoV-2 RNA is generally detectable in upper respiratory specimens during the acute phase of infection. The lowest concentration of SARS-CoV-2 viral copies this assay can detect is 138 copies/mL. A negative result does not preclude SARS-Cov-2 infection and should not be used as the sole basis for treatment or other patient management decisions. A negative result may occur with  improper specimen collection/handling, submission of specimen other than nasopharyngeal swab, presence of viral mutation(s) within the areas targeted by this assay, and inadequate number of viral copies(<138 copies/mL). A negative result must be combined with clinical observations, patient history, and epidemiological information. The expected result is Negative.  Fact Sheet for Patients:  BloggerCourse.com  Fact Sheet for Healthcare Providers:  SeriousBroker.it  This test is no t yet approved or cleared by the [place]  FDA and  has been authorized for detection and/or diagnosis of SARS-CoV-2 by FDA under an Emergency Use Authorization (EUA). This EUA will remain  in effect (meaning this test can be used) for the duration of the COVID-19 declaration under Section 564(b)(1) of the Act, 21 U.S.C.section 360bbb-3(b)(1), unless the authorization is  terminated  or revoked sooner.       Influenza A by PCR NEGATIVE NEGATIVE Final   Influenza B by PCR NEGATIVE NEGATIVE Final    Comment: (NOTE) The Xpert Xpress SARS-CoV-2/FLU/RSV plus assay is intended as an aid in the diagnosis of influenza from Nasopharyngeal swab specimens and should not be used as a sole basis for treatment. Nasal washings and aspirates are unacceptable for Xpert Xpress SARS-CoV-2/FLU/RSV testing.  Fact Sheet for Patients: BloggerCourse.com  Fact Sheet for Healthcare Providers: SeriousBroker.it  This test is not yet approved or cleared by the [place]  FDA and has been authorized for detection and/or diagnosis of SARS-CoV-2 by FDA under an Emergency Use Authorization (EUA). This EUA will remain in effect (meaning this test can be used) for the duration of the COVID-19 declaration under Section 564(b)(1) of the Act, 21 U.S.C. section 360bbb-3(b)(1), unless the authorization is terminated or revoked.     Resp Syncytial Virus by PCR NEGATIVE NEGATIVE Final    Comment: (NOTE) Fact Sheet for Patients: BloggerCourse.com  Fact Sheet for Healthcare Providers: SeriousBroker.it  This test is not yet approved or cleared by the [place]  FDA and has been authorized for detection and/or diagnosis of SARS-CoV-2 by FDA under an Emergency Use Authorization (EUA). This EUA will remain in effect (meaning this test can be used) for the duration of the COVID-19 declaration under Section 564(b)(1) of the Act, 21 U.S.C. section 360bbb-3(b)(1), unless the authorization is terminated or revoked.  Performed at [facility], [address], [place], [place] [address]   Expectorated Sputum Assessment w Gram Stain, Rflx to Resp Cult     Status: None (Preliminary result)   Collection Time: 04/07/24  6:02 PM   Specimen: Expectorated Sputum  Result Value Ref Range Status    Specimen Description EXPECTORATED SPUTUM  Final   Special Requests NONE  Final   Sputum evaluation   Final    THIS SPECIMEN IS ACCEPTABLE FOR SPUTUM CULTURE Performed at [facility], [address], [place], [place] [address]    Report Status PENDING  Incomplete  Culture, Respiratory w Gram Stain     Status: None (Preliminary result)   Collection Time: 04/07/24  6:02 PM  Result Value Ref Range Status   Specimen Description   Final    EXPECTORATED SPUTUM Performed at [facility], [address],  [place], [place] [address]    Special Requests   Final    NONE Reflexed from [id number] Performed at [facility], [address], [place], [place] [address]    Gram Stain   Final    FEW WBC PRESENT, PREDOMINANTLY PMN RARE GRAM POSITIVE COCCI IN PAIRS    Culture   Final    TOO YOUNG TO READ Performed at [facility], [address] [place], [place] [address]    Report Status PENDING  Incomplete    Labs: CBC: Recent Labs  Lab 04/06/24 1732 04/07/24 0444 04/08/24 0509 04/09/24 0436  WBC 15.4* 10.8* 7.5 10.5  NEUTROABS 13.0*  --   --   --   HGB 13.9 13.0 12.4* 12.7*  HCT 42.2 40.8 38.8* 38.8*  MCV 88.7 91.3 90.9 89.8  PLT 188 165 177 196   Basic Metabolic Panel: Recent Labs  Lab 04/06/24 1732 04/07/24 0444 04/08/24 0509 04/09/24 0436  NA 134* 139 135 136  K 4.0 3.9 4.6 4.0  CL 98 103 104 103  CO2 23 27 24 23   GLUCOSE 171* 162* 221* 277*  BUN 20 20 23  29*  CREATININE 1.18 1.15 1.10 1.02  CALCIUM 8.8* 8.3* 8.4* 8.9  MG  --  2.0  --  2.2  PHOS  --  3.2  --   --    Liver Function Tests: Recent Labs  Lab 04/07/24 0444 04/09/24 0436  AST 12* 16  ALT 14 18  ALKPHOS 54 57  BILITOT 1.3* 0.4  PROT 6.1* 6.4*  ALBUMIN 2.8* 2.7*   CBG: Recent Labs  Lab 04/08/24 0730 04/08/24 1151 04/08/24 1625 04/08/24 2013 04/09/24 0701  GLUCAP 192* 268* 409* 341* 226*    Discharge time spent: greater than 30 minutes.  Signed: Pearlina Friedly , MD [facility] 04/09/2024

## 2024-04-09 NOTE — Progress Notes (Signed)
 Mobility Specialist Progress Note:    04/09/24 1010  Mobility  Activity Ambulated with assistance in hallway  Level of Assistance Modified independent, requires aide device or extra time  Assistive Device None  Distance Ambulated (ft) 200 ft  Range of Motion/Exercises Active;All extremities  Activity Response Tolerated well  Mobility Referral Yes  Mobility visit 1 Mobility  Mobility Specialist Start Time (ACUTE ONLY) 1010  Mobility Specialist Stop Time (ACUTE ONLY) 1030  Mobility Specialist Time Calculation (min) (ACUTE ONLY) 20 min   Pt received in chair, agreeable to mobility. ModI to stand and ambulate with no AD. Tolerated well, SpO2 92-96% on 4L during ambulation. Returned pt to chair, all needs met.  Kanaan Kagawa Mobility Specialist Please contact via Special educational needs teacher or  Rehab office at [phone]

## 2024-04-11 ENCOUNTER — Ambulatory Visit (HOSPITAL_COMMUNITY)
Admission: RE | Admit: 2024-04-11 | Discharge: 2024-04-11 | Disposition: A | Discharge status: Home / Self Care | Payer: Medicare Other | Source: Ambulatory Visit | Attending: Cardiology | Admitting: Cardiology

## 2024-04-11 DIAGNOSIS — I5032 Chronic diastolic (congestive) heart failure: Secondary | ICD-10-CM | POA: Insufficient documentation

## 2024-04-11 LAB — CULTURE, RESPIRATORY W GRAM STAIN

## 2024-04-11 LAB — CULTURE, BLOOD (ROUTINE X 2)
Culture: NO GROWTH
Culture: NO GROWTH
Special Requests: ADEQUATE
Special Requests: ADEQUATE

## 2024-04-11 LAB — HEPATIC FUNCTION PANEL
ALT: 28 U/L (ref 0–44)
AST: 17 U/L (ref 15–41)
Albumin: 2.9 g/dL — ABNORMAL LOW (ref 3.5–5.0)
Alkaline Phosphatase: 56 U/L (ref 38–126)
Bilirubin, Direct: 0.1 mg/dL (ref 0.0–0.2)
Indirect Bilirubin: 0.6 mg/dL (ref 0.3–0.9)
Total Bilirubin: 0.7 mg/dL (ref 0.0–1.2)
Total Protein: 6.5 g/dL (ref 6.5–8.1)

## 2024-04-11 LAB — LIPID PANEL
Cholesterol: 145 mg/dL (ref 0–200)
HDL: 31 mg/dL — ABNORMAL LOW (ref >40)
LDL Cholesterol: 74 mg/dL (ref 0–99)
Total CHOL/HDL Ratio: 4.7 ratio
Triglycerides: 198 mg/dL — ABNORMAL HIGH (ref <150)
VLDL: 40 mg/dL (ref 0–40)

## 2024-04-12 LAB — EXPECTORATED SPUTUM ASSESSMENT W GRAM STAIN, RFLX TO RESP C

## 2024-04-15 ENCOUNTER — Encounter (HOSPITAL_COMMUNITY): Payer: Self-pay

## 2024-04-15 DIAGNOSIS — E782 Mixed hyperlipidemia: Secondary | ICD-10-CM

## 2024-04-15 MED ORDER — ATORVASTATIN CALCIUM 80 MG PO TABS: 80.0000 mg | ORAL_TABLET | Freq: Every day | ORAL | 11 refills | Status: DC

## 2024-04-25 ENCOUNTER — Other Ambulatory Visit: Payer: Self-pay | Admitting: Pharmacy Technician

## 2024-04-25 ENCOUNTER — Other Ambulatory Visit: Payer: Self-pay | Admitting: Pulmonary Disease

## 2024-04-25 ENCOUNTER — Other Ambulatory Visit: Payer: Self-pay

## 2024-04-25 DIAGNOSIS — J4550 Severe persistent asthma, uncomplicated: Secondary | ICD-10-CM

## 2024-04-25 MED ORDER — DUPIXENT 300 MG/2ML ~~LOC~~ SOAJ
300.0000 mg | SUBCUTANEOUS | 1 refills | Status: DC
Start: 2024-04-25 — End: 2024-10-05
  Filled 2024-04-25: qty 4, 28d supply, fill #0
  Filled 2024-05-17: qty 4, 28d supply, fill #1
  Filled 2024-06-20: qty 4, 28d supply, fill #2
  Filled 2024-07-15 (×2): qty 4, 28d supply, fill #3
  Filled 2024-08-09: qty 4, 28d supply, fill #4
  Filled 2024-09-12: qty 4, 28d supply, fill #5

## 2024-04-25 NOTE — Telephone Encounter (Signed)
 Pt saw Dr. Viva Grise 4/25 and has follow up appt 05/2024.

## 2024-04-25 NOTE — Progress Notes (Signed)
 Specialty Pharmacy Refill Coordination Note  Clarence Dawson is a 78 y.o. male contacted today regarding refills of specialty medication(s) Dupilumab  (Dupixent )   Patient requested (Patient-Rptd) Pickup at [facility] at [facility] date: (Patient-Rptd) 04/29/24   Medication will be filled on 04/28/24.   RR sent to MD

## 2024-04-26 ENCOUNTER — Telehealth (HOSPITAL_COMMUNITY): Payer: Self-pay

## 2024-04-26 ENCOUNTER — Encounter (HOSPITAL_COMMUNITY): Payer: Self-pay

## 2024-04-26 NOTE — Telephone Encounter (Signed)
 error

## 2024-04-28 ENCOUNTER — Other Ambulatory Visit: Payer: Self-pay

## 2024-05-02 ENCOUNTER — Telehealth (HOSPITAL_COMMUNITY): Payer: Self-pay | Admitting: Cardiology

## 2024-05-02 NOTE — Telephone Encounter (Signed)
 Pt aware of 04/14/24 lab results Scheduled for repeat labs in June 2025

## 2024-05-13 ENCOUNTER — Ambulatory Visit (INDEPENDENT_AMBULATORY_CARE_PROVIDER_SITE_OTHER): Payer: Medicare Other

## 2024-05-13 DIAGNOSIS — I495 Sick sinus syndrome: Secondary | ICD-10-CM | POA: Diagnosis not present

## 2024-05-13 LAB — CUP PACEART REMOTE DEVICE CHECK
Battery Remaining Longevity: 92 mo
Battery Remaining Percentage: 85 %
Battery Voltage: 2.99 V
Brady Statistic AP VP Percent: 1.5 %
Brady Statistic AP VS Percent: 83 %
Brady Statistic AS VP Percent: 1.1 %
Brady Statistic AS VS Percent: 8.9 %
Brady Statistic RA Percent Paced: 79 %
Brady Statistic RV Percent Paced: 3.1 %
Date Time Interrogation Session: 20250516075948
Implantable Lead Connection Status: 753985
Implantable Lead Connection Status: 753985
Implantable Lead Implant Date: 20051106
Implantable Lead Implant Date: 20051106
Implantable Lead Location: 753859
Implantable Lead Location: 753860
Implantable Pulse Generator Implant Date: 20230710
Lead Channel Impedance Value: 340 Ohm
Lead Channel Impedance Value: 650 Ohm
Lead Channel Pacing Threshold Amplitude: 0.75 V
Lead Channel Pacing Threshold Amplitude: 1 V
Lead Channel Pacing Threshold Pulse Width: 0.5 ms
Lead Channel Pacing Threshold Pulse Width: 0.5 ms
Lead Channel Sensing Intrinsic Amplitude: 11.2 mV
Lead Channel Sensing Intrinsic Amplitude: 2.2 mV
Lead Channel Setting Pacing Amplitude: 2 V
Lead Channel Setting Pacing Amplitude: 2.5 V
Lead Channel Setting Pacing Pulse Width: 0.5 ms
Lead Channel Setting Sensing Sensitivity: 2 mV
Pulse Gen Model: 2272
Pulse Gen Serial Number: 8099041

## 2024-05-16 ENCOUNTER — Ambulatory Visit: Payer: Self-pay | Admitting: Internal Medicine

## 2024-05-17 ENCOUNTER — Other Ambulatory Visit: Payer: Self-pay

## 2024-05-17 NOTE — Progress Notes (Signed)
 Specialty Pharmacy Refill Coordination Note  Clarence Dawson is a 78 y.o. male contacted today regarding refills of specialty medication(s) Dupilumab  (Dupixent )   Patient requested (Patient-Rptd) Pickup at [facility] at [facility] date: (Patient-Rptd) 05/27/24   Medication will be filled on 05/26/24.

## 2024-05-26 ENCOUNTER — Other Ambulatory Visit: Payer: Self-pay

## 2024-05-31 ENCOUNTER — Encounter: Payer: Self-pay | Admitting: Pulmonary Disease

## 2024-05-31 ENCOUNTER — Ambulatory Visit: Admitting: Pulmonary Disease

## 2024-05-31 VITALS — BP 120/60 | HR 60 | Temp 97.3°F | Ht 69.0 in | Wt 222.8 lb

## 2024-05-31 DIAGNOSIS — I272 Pulmonary hypertension, unspecified: Secondary | ICD-10-CM | POA: Diagnosis not present

## 2024-05-31 DIAGNOSIS — J479 Bronchiectasis, uncomplicated: Secondary | ICD-10-CM | POA: Diagnosis not present

## 2024-05-31 DIAGNOSIS — J9611 Chronic respiratory failure with hypoxia: Secondary | ICD-10-CM | POA: Diagnosis not present

## 2024-05-31 DIAGNOSIS — G4733 Obstructive sleep apnea (adult) (pediatric): Secondary | ICD-10-CM

## 2024-05-31 DIAGNOSIS — I4819 Other persistent atrial fibrillation: Secondary | ICD-10-CM

## 2024-05-31 DIAGNOSIS — J4489 Other specified chronic obstructive pulmonary disease: Secondary | ICD-10-CM

## 2024-05-31 NOTE — Patient Instructions (Signed)
 VISIT SUMMARY:  You had a follow-up appointment to review your asthma-COPD overlap syndrome, obstructive sleep apnea, and recent hospitalization for sepsis and pneumonia. Overall, you are managing well with your current treatments, but there are some areas to monitor and adjust.  YOUR PLAN:  -ASTHMA-COPD OVERLAP SYNDROME: Asthma-COPD overlap syndrome is a condition where you have symptoms of both asthma and chronic obstructive pulmonary disease. You are currently managing this with Dupixent  and Otovel. You mentioned that your symptoms increase towards the end of your Dupixent  cycle, so we may need to adjust the dosing frequency. Continue using the vest for airway clearance regularly and pace your activities to prevent chest tightness.  -OBSTRUCTIVE SLEEP APNEA ON BIPAP: Obstructive sleep apnea is a condition where your breathing stops and starts during sleep. Your BiPAP therapy is working well, and you are getting good sleep. Continue using your BiPAP as prescribed. The dryness you experience upon waking is likely due to the BiPAP use.  -SEPSIS PNEUMONIA: Sepsis pneumonia is a severe lung infection that can lead to a systemic infection. You were hospitalized for this in April, but there are no new infections, and your previous infection has resolved. Continue to be vigilant about any new symptoms and seek medical attention promptly if needed.  -GOALS OF CARE: You have a healthcare power of attorney, your sister-in-law, who is a Engineer, civil (consulting). You value being close to your family and maintaining your current quality of life.  INSTRUCTIONS:  Please schedule a follow-up appointment in three months to monitor your ongoing management and make any necessary adjustments.

## 2024-05-31 NOTE — Progress Notes (Signed)
 Subjective:    Patient ID: Clarence Dawson, male    DOB: 1946-02-16, 78 y.o.   MRN: [id number]  Patient Care Team: Artemisa Bile, MD as PCP - General (Internal Medicine) Tammie Fall, MD as PCP - Cardiology (Cardiology) Tammie Fall, MD as PCP - Electrophysiology (Cardiology) Marc Senior, MD as Consulting Physician (Pulmonary Disease)  Chief Complaint  Patient presents with   Follow-up    DOE. No wheezing. Cough with clear sputum. BIPAP- no problems with the mask or the pressure.    BACKGROUND/INTERVAL: Clarence Dawson is a very complex 78 year old former smoker (74 PY) who presents for follow-up from his most recent visit of 25 January 2024.  At that time he was using Ohtuvayre .  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  Prior issues with ABPA and Aspergillus colonization.  He has also been diagnosed with moderate pulmonary arterial hypertension (group 3), moderate nonobstructive coronary artery disease.  He also has a history of atrial fibrillation on Xarelto  and Tikosyn .  He has very complex issues as noted.  He completed pulmonary rehab.  He uses chest vest physiotherapy for management of his bronchiectasis.  COPD/asthma management is maximized. On the 17 December 2023 visit he was placed on minocycline  for stenotrophomonas infection (on Tikosyn  and cannot have QT prolonging medications such as Levaquin  and sulfa  medications increase Tikosyn  level).  He was last seen on 09 March 2024 and at that time required a second round of minocycline .  Sputum specimen since that second therapy has shown cure of stenotrophomonas.[name]   HPI Discussed the use of AI scribe software for clinical note transcription with the patient, who gave verbal consent to proceed.  Discussed the use of AI scribe software for clinical note transcription with the patient, who gave verbal consent to proceed.  History of  Present Illness   METE PURDUM "Clarence Dawson" is a 78 year old male with asthma-COPD overlap and obstructive sleep apnea who presents for follow-up.  He also has obstructive sleep apnea on BiPAP and chronic respiratory failure with hypoxia on supplemental oxygen .  He was hospitalized in April due to sepsis and pneumonia. He experienced nocturnal chills and an inability to warm up, prompting him to seek medical attention. An x-ray at an urgent care facility led to his transfer to the emergency room, where he was admitted for five days. He feels vulnerable to infections.  He is currently on Dupixent  and Ohtuvayre , which he feels are beneficial, although he notices an increase in symptoms towards the end of his Dupixent  cycle. He feels generally well but finds the two-week interval between doses slightly prolonged.  He uses a vest for airway clearance two to three times a day, depending on his memory and activity level. It sometimes helps with mucus expectoration. He experiences exertional chest tightness, which resolves after resting for a few minutes.  He sleeps well with his BiPAP, getting between eight and nine hours of sleep per night. He maintains a consistent bedtime routine, contributing to good sleep quality, but wakes up with dryness, which he attributes to BiPAP use.  Socially, his children live five states away in [place] , visiting occasionally and using his home as a rest stop during their travels.      Review of Systems A 10 point review of systems was performed and it is as noted above otherwise negative.   Patient Active Problem List   Diagnosis Date Noted   Lactic  acidosis 04/07/2024   Elevated brain natriuretic peptide (BNP) level 04/07/2024   COPD (chronic obstructive pulmonary disease) (HCC) 04/07/2024   Elevated troponin 04/07/2024   Prolonged QT interval 04/07/2024   Atrial fibrillation, chronic (HCC) 04/07/2024   Acquired hypothyroidism 04/07/2024   Encounter for  monitoring dofetilide  therapy 02/25/2024   Chronic respiratory failure with hypoxia (HCC) 06/12/2023   Hypercoagulable state due to persistent atrial fibrillation (HCC) 01/02/2020   Chronic diastolic heart failure (HCC) 11/22/2019   Paroxysmal atrial fibrillation (HCC) 09/22/2019   Atrial fibrillation with RVR (HCC)    Lobar pneumonia (HCC) 09/21/2019   Acute respiratory failure with hypoxia (HCC) 09/21/2019   Acute on chronic respiratory failure with hypoxia (HCC) 09/18/2019   Elevated lactic acid level    GERD (gastroesophageal reflux disease)    Chronic bronchitis (HCC) 02/09/2019   Sepsis due to pneumonia (HCC) 03/03/2018   Primary localized osteoarthritis of right knee 08/11/2017   Asthma with acute exacerbation 12/08/2016   Chest pain 10/20/2016   Essential hypertension 10/20/2016   Morbid obesity due to excess calories (HCC) 06/24/2016   Severe persistent asthma 02/12/2016   Upper airway cough syndrome 01/24/2016   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   Persistent atrial fibrillation    Syncope    Hyperthyroidism    Hyperlipidemia    RBBB (right bundle branch block)    Tricuspid valve disorder    Aortic valve disorder    Carotid artery stenosis    Type 2 diabetes mellitus (HCC)    Pacemaker    OSA on CPAP    Arthritis    Sinoatrial node dysfunction (HCC)    PVC's (premature ventricular contractions) 07/17/2011   Essential hypertension, benign 01/24/2011   Premature ventricular contractions 01/24/2011   PPM-St.Jude 01/24/2011    Social History   Tobacco Use   Smoking status: Former    Current packs/day: 0.00    Average packs/day: 3.0 packs/day for 26.0 years (77.9 ttl pk-yrs)    Types: Cigarettes    Start date: 02/20/1958    Quit date: 02/12/1984    Years since quitting: 40.3   Smokeless tobacco: Former    Types: Chew   Tobacco comments:    Former smoker 08/30/2021  Substance Use Topics   Alcohol  use: Yes    Alcohol /week: 2.0 - 4.0 standard drinks of alcohol     Types:  1 - 2 Glasses of wine, 1 - 2 Cans of beer per week    Comment: 1 glass of wine or beer 3 times a week 12/26/22    Allergies  Allergen Reactions   Food Anaphylaxis and Shortness Of Breath    TREE NUTS   Iodinated Contrast Media Hives and Shortness Of Breath    Patient states hives to throat closing. (01/15/17: patient states this reaction was "about 20 years ago" with possibly an IVP.  He now says high doses of prednisone  "throw me into AFib."  He has tolerated CT arthrograms with Benadrly 50mg  PO one hour before injection.  Kevin Pellant, RN)   Shellfish Allergy  Anaphylaxis and Shortness Of Breath    To shellfish, crabs.  Makes him feel like "things are crawling all over" me.  Denies airway issues with these foods.  Kevin Pellant, RN 01/15/17)   Goat-Derived Products Hives    GOAT CHEESE    Prednisone  Palpitations    PRECIPITATES A-FIB   Diclofenac Sodium Other (See Comments)    Hives, "buggy feeling all over"   Metformin  And Related Diarrhea    High doses at once  Vancomycin  Anxiety    Red man syndrome   Voltaren [Diclofenac Sodium] Other (See Comments)    Feels like things are crawling on him    Current Meds  Medication Sig   acetaminophen  (TYLENOL ) 500 MG tablet Take 1,000 mg by mouth 3 (three) times daily.   albuterol  (PROVENTIL ) (2.5 MG/3ML) 0.083% nebulizer solution Take 3 mLs (2.5 mg total) by nebulization every 6 (six) hours as needed for wheezing or shortness of breath.   albuterol  (VENTOLIN  HFA) 108 (90 Base) MCG/ACT inhaler Inhale 1 puff into the lungs every 6 (six) hours as needed for wheezing or shortness of breath.   atorvastatin  (LIPITOR) 80 MG tablet Take 1 tablet (80 mg total) by mouth daily.   bisoprolol  (ZEBETA ) 5 MG tablet TAKE 1 AND 1/2 TABLET BY MOUTH TWICE DAILY.   BREZTRI  AEROSPHERE 160-9-4.8 MCG/ACT AERO INHALE 2 PUFFS INTO THE LUNGS TWICE DAILY.   Carboxymethylcellul-Glycerin (LUBRICATING EYE DROPS OP) Place 1 drop into the right eye 2 (two) times a week.  Clear eyes as needed   diltiazem  (CARDIZEM  CD) 120 MG 24 hr capsule TAKE (1) CAPSULE BY MOUTH DAILY, MAY TAKE A EXTRA CAPSULE DAILY FOR BREAKTHROUGH AFIB.   dofetilide  (TIKOSYN ) 250 MCG capsule TAKE ONE CAPSULE BY MOUTH TWICE DAILY   Dupilumab  (DUPIXENT ) 300 MG/2ML SOAJ Inject 300 mg into the skin every 14 (fourteen) days.   Ensifentrine  (OHTUVAYRE ) 3 MG/2.5ML SUSP Take 3 mg by nebulization 2 (two) times daily.   EPINEPHrine  0.3 mg/0.3 mL IJ SOAJ injection Inject 0.3 mg into the muscle as needed for anaphylaxis.   fluticasone  (FLONASE ) 50 MCG/ACT nasal spray Place 2 sprays into both nostrils daily.   furosemide  (LASIX ) 40 MG tablet Take 1 tablet (40 mg total) by mouth 2 (two) times daily.   glipiZIDE  (GLUCOTROL  XL) 5 MG 24 hr tablet Take 5 mg by mouth daily before breakfast.   JARDIANCE  25 MG TABS tablet Take 25 mg by mouth daily before breakfast.   levothyroxine  (SYNTHROID ) 150 MCG tablet Take 150 mcg by mouth daily before breakfast.   losartan  (COZAAR ) 50 MG tablet Take 50 mg by mouth at bedtime.   metFORMIN  (GLUCOPHAGE -XR) 500 MG 24 hr tablet Take 500 mg by mouth in the morning and at bedtime.   Multiple Vitamin (MULTIVITAMIN) tablet Take 1 tablet by mouth daily.   omeprazole  (PRILOSEC  OTC) 20 MG tablet Take 20 mg by mouth every morning.   PRESCRIPTION MEDICATION 2 (two) times daily. Thumper vest   Respiratory Therapy Supplies (FLUTTER) DEVI Use as directed   rivaroxaban  (XARELTO ) 20 MG TABS tablet Take 20 mg by mouth at bedtime.   sodium chloride  (OCEAN) 0.65 % SOLN nasal spray Place 1 spray into both nostrils as needed for congestion.    Immunization History  Administered Date(s) Administered   Fluad Quad(high Dose 65+) 09/12/2019, 10/15/2022   Influenza Split 11/14/2015, 10/11/2018   Influenza, High Dose Seasonal PF 09/28/2017   Influenza-Unspecified 11/02/2016, 09/03/2020, 10/03/2021, 10/23/2023   Moderna SARS-COV2 Booster Vaccination 08/20/2020, 03/04/2021   Moderna Sars-Covid-2  Vaccination 02/15/2020, 03/14/2020   Pneumococcal Conjugate-13 09/28/2015   Tdap 11/28/2019        Objective:     BP 120/60 (BP Location: Right Arm, Cuff Size: Large)   Pulse 60   Temp (!) 97.3 F (36.3 C)   Ht 5\' 9"  (1.753 m)   Wt 222 lb 12.8 oz (101.1 kg)   SpO2 93%   BMI 32.90 kg/m   SpO2: 93 % O2 Device: Nasal cannula O2 Flow Rate (  L/min): 4 L/min O2 Type: Pulse O2  GENERAL: Well-developed, obese patient in no acute distress.  He is comfortable on nasal cannula O2.  He is fully ambulatory. HEAD: Normocephalic, atraumatic.  EYES: Pupils equal, round, reactive to light.  No scleral icterus.  MOUTH:   Upper and lower dentures.  Oral mucosa moist.  No thrush. NECK: Supple. No thyromegaly. Trachea midline. No JVD.  No adenopathy. PULMONARY: Good air entry bilaterally air entry symmetrical throughout.  Diffuse rhonchi throughout.  No wheezing.  CARDIOVASCULAR: S1 and S2.  Regular rate and rhythm, A-fib rate 60.  Grade 2/6 mitral regurgitation murmur, grade 3/6 aortic stenosis murmur. Pacemaker on left. GASTROINTESTINAL: Obese, otherwise benign. MUSCULOSKELETAL: No joint deformity, no clubbing, no edema.  NEUROLOGIC: No focal deficits, speech is fluent.  No gait disturbance noted.  Awake, alert and oriented. SKIN: Intact,warm,dry. PSYCH: Mood and behavior appropriate.        Assessment & Plan:     ICD-10-CM   1. Asthma-COPD overlap syndrome (HCC) - SEVERE  J44.89     2. Chronic respiratory failure with hypoxia (HCC)  J96.11     3. Bronchiectasis without complication (HCC)  J47.9     4. Pulmonary HTN (HCC)  I27.20     5. Persistent atrial fibrillation  I48.19     6. OSA (obstructive sleep apnea)  G47.33      Discussion:    Asthma-COPD overlap syndrome Asthma-COPD overlap syndrome managed with Dupixent  and Ohtuvayre . He reports increased symptoms towards the end of the Dupixent  cycle, suggesting a potential need for dosing frequency adjustment. The vest for  airway clearance effectively manages secretions. Occasional chest tightness resolves with rest. - Administer Dupixent  as scheduled. - Continue Ohtuvayre  therapy. - Encourage consistent use of the vest for airway clearance. - Advise pacing activities to prevent airway tightness.  Obstructive sleep apnea on BiPAP Obstructive sleep apnea well-managed with BiPAP therapy. He reports excellent sleep quality, averaging 8-9 hours per night, and feels well-rested. Experiences dryness upon waking, likely due to BiPAP use. - Continue BiPAP therapy.  Sepsis pneumonia Sepsis pneumonia with recent hospitalization in April. No new infections noted, and previous stenotrophomonas infection has resolved. He is vigilant about symptoms and seeks timely medical attention when needed.  Goals of Care He has a healthcare power of attorney, his sister-in-law, who is a Engineer, civil (consulting). He values being close to family despite geographical distance and prioritizes maintaining his current quality of life.  Follow-up Follow-up visit scheduled to monitor ongoing management of conditions. - Schedule follow-up appointment in three months.     Advised if symptoms do not improve or worsen, to please contact office for sooner follow up or seek emergency care.    I spent 40 minutes of dedicated to the care of this patient on the date of this encounter to include pre-visit review of records, face-to-face time with the patient discussing conditions above, post visit ordering of testing, clinical documentation with the electronic health record, making appropriate referrals as documented, and communicating necessary findings to members of the patients care team.     C. Chloe Counter, MD [facility]    *This note was generated using voice recognition software/Dragon and/or AI transcription program.  Despite best efforts to proofread, errors can occur which can change the meaning. Any  transcriptional errors that result from this process are unintentional and may not be fully corrected at the time of dictation.

## 2024-06-02 ENCOUNTER — Other Ambulatory Visit: Payer: Self-pay | Admitting: Pulmonary Disease

## 2024-06-02 DIAGNOSIS — J4489 Other specified chronic obstructive pulmonary disease: Secondary | ICD-10-CM

## 2024-06-10 ENCOUNTER — Ambulatory Visit (HOSPITAL_COMMUNITY): Payer: Self-pay | Admitting: Cardiology

## 2024-06-10 ENCOUNTER — Ambulatory Visit (HOSPITAL_COMMUNITY)
Admission: RE | Admit: 2024-06-10 | Discharge: 2024-06-10 | Disposition: A | Discharge status: Home / Self Care | Source: Ambulatory Visit | Attending: Cardiology | Admitting: Cardiology

## 2024-06-10 ENCOUNTER — Ambulatory Visit (HOSPITAL_BASED_OUTPATIENT_CLINIC_OR_DEPARTMENT_OTHER)
Admission: RE | Admit: 2024-06-10 | Discharge: 2024-06-10 | Disposition: A | Discharge status: Home / Self Care | Source: Ambulatory Visit | Attending: Cardiology | Admitting: Cardiology

## 2024-06-10 VITALS — BP 118/70 | HR 60 | Wt 223.8 lb

## 2024-06-10 DIAGNOSIS — E785 Hyperlipidemia, unspecified: Secondary | ICD-10-CM | POA: Insufficient documentation

## 2024-06-10 DIAGNOSIS — I480 Paroxysmal atrial fibrillation: Secondary | ICD-10-CM | POA: Diagnosis not present

## 2024-06-10 DIAGNOSIS — Z7984 Long term (current) use of oral hypoglycemic drugs: Secondary | ICD-10-CM | POA: Insufficient documentation

## 2024-06-10 DIAGNOSIS — I110 Hypertensive heart disease with heart failure: Secondary | ICD-10-CM | POA: Insufficient documentation

## 2024-06-10 DIAGNOSIS — I2510 Atherosclerotic heart disease of native coronary artery without angina pectoris: Secondary | ICD-10-CM | POA: Insufficient documentation

## 2024-06-10 DIAGNOSIS — I495 Sick sinus syndrome: Secondary | ICD-10-CM | POA: Diagnosis not present

## 2024-06-10 DIAGNOSIS — Z7901 Long term (current) use of anticoagulants: Secondary | ICD-10-CM | POA: Diagnosis not present

## 2024-06-10 DIAGNOSIS — G4733 Obstructive sleep apnea (adult) (pediatric): Secondary | ICD-10-CM | POA: Insufficient documentation

## 2024-06-10 DIAGNOSIS — Z950 Presence of cardiac pacemaker: Secondary | ICD-10-CM | POA: Insufficient documentation

## 2024-06-10 DIAGNOSIS — E119 Type 2 diabetes mellitus without complications: Secondary | ICD-10-CM | POA: Diagnosis not present

## 2024-06-10 DIAGNOSIS — I4510 Unspecified right bundle-branch block: Secondary | ICD-10-CM | POA: Diagnosis not present

## 2024-06-10 DIAGNOSIS — I083 Combined rheumatic disorders of mitral, aortic and tricuspid valves: Secondary | ICD-10-CM | POA: Diagnosis not present

## 2024-06-10 DIAGNOSIS — Z9981 Dependence on supplemental oxygen: Secondary | ICD-10-CM | POA: Diagnosis not present

## 2024-06-10 DIAGNOSIS — J9611 Chronic respiratory failure with hypoxia: Secondary | ICD-10-CM

## 2024-06-10 DIAGNOSIS — J449 Chronic obstructive pulmonary disease, unspecified: Secondary | ICD-10-CM | POA: Diagnosis not present

## 2024-06-10 DIAGNOSIS — Z79899 Other long term (current) drug therapy: Secondary | ICD-10-CM | POA: Insufficient documentation

## 2024-06-10 DIAGNOSIS — I5032 Chronic diastolic (congestive) heart failure: Secondary | ICD-10-CM | POA: Diagnosis present

## 2024-06-10 DIAGNOSIS — I493 Ventricular premature depolarization: Secondary | ICD-10-CM | POA: Insufficient documentation

## 2024-06-10 DIAGNOSIS — I272 Pulmonary hypertension, unspecified: Secondary | ICD-10-CM | POA: Diagnosis not present

## 2024-06-10 LAB — ECHOCARDIOGRAM COMPLETE
AR max vel: 1.05 cm2
AV Area VTI: 1.15 cm2
AV Area mean vel: 1.05 cm2
AV Mean grad: 10 mmHg
AV Peak grad: 19.4 mmHg
Ao pk vel: 2.2 m/s
Area-P 1/2: 3.48 cm2
S' Lateral: 2.9 cm

## 2024-06-10 LAB — LIPID PANEL
Cholesterol: 145 mg/dL (ref 0–200)
HDL: 37 mg/dL — ABNORMAL LOW (ref >40)
LDL Cholesterol: 69 mg/dL (ref 0–99)
Total CHOL/HDL Ratio: 3.9 ratio
Triglycerides: 196 mg/dL — ABNORMAL HIGH (ref <150)
VLDL: 39 mg/dL (ref 0–40)

## 2024-06-10 LAB — BRAIN NATRIURETIC PEPTIDE: B Natriuretic Peptide: 529.8 pg/mL — ABNORMAL HIGH (ref 0.0–100.0)

## 2024-06-10 LAB — BASIC METABOLIC PANEL WITH GFR
Anion gap: 11 (ref 5–15)
BUN: 22 mg/dL (ref 8–23)
CO2: 27 mmol/L (ref 22–32)
Calcium: 9.6 mg/dL (ref 8.9–10.3)
Chloride: 104 mmol/L (ref 98–111)
Creatinine, Ser: 1.05 mg/dL (ref 0.61–1.24)
GFR, Estimated: >60 mL/min (ref >60)
Glucose, Bld: 153 mg/dL — ABNORMAL HIGH (ref 70–99)
Potassium: 4.6 mmol/L (ref 3.5–5.1)
Sodium: 142 mmol/L (ref 135–145)

## 2024-06-10 MED ORDER — SPIRONOLACTONE 25 MG PO TABS: 12.5000 mg | ORAL_TABLET | Freq: Every day | ORAL | 3 refills | Status: DC

## 2024-06-10 MED ORDER — FUROSEMIDE 40 MG PO TABS: 60.0000 mg | ORAL_TABLET | Freq: Two times a day (BID) | ORAL | 3 refills | Status: DC

## 2024-06-10 NOTE — Progress Notes (Signed)
 Echocardiogram 2D Echocardiogram has been performed.  Clarence Dawson 06/10/2024, 1:42 PM

## 2024-06-10 NOTE — Patient Instructions (Signed)
 Medication Changes:  INCREASE LASIX  (FUROSEMIDE ) 60MG  TWICE DAILY   START: SPIRONOLACTONE 12.5MG  ONCE DAILY   Lab Work:  Labs done today, your results will be available in MyChart, we will contact you for abnormal readings.  THEN LABS AGAIN ON 6/23  Follow-Up in: 1 MONTH AS SCHEDULED   At the [facility], you and your health needs are our priority. We have a designated team specialized in the treatment of Heart Failure. This Care Team includes your primary Heart Failure Specialized Cardiologist (physician), Advanced Practice Providers (APPs- Physician Assistants and Nurse Practitioners), and Pharmacist who all work together to provide you with the care you need, when you need it.   You may see any of the following providers on your designated Care Team at your next follow up:  Dr. Jules Oar Dr. Peder Bourdon Dr. Alwin Baars Dr. Judyth Nunnery Nieves Bars, NP Ruddy Corral, [place] [facility] [place], [place] Dennise Fitz, NP [place] Lee, NP Luster Salters, PharmD   Please be sure to bring in all your medications bottles to every appointment.   Need to Contact Us :  If you have any questions or concerns before your next appointment please send us  a message through Wing or call our office at [phone].    TO LEAVE A MESSAGE FOR THE NURSE SELECT OPTION 2, PLEASE LEAVE A MESSAGE INCLUDING: YOUR NAME DATE OF BIRTH CALL BACK NUMBER REASON FOR CALL**this is important as we prioritize the call backs  YOU WILL RECEIVE A CALL BACK THE SAME DAY AS LONG AS YOU CALL BEFORE 4:00 PM

## 2024-06-11 NOTE — Progress Notes (Signed)
 PCP: Artemisa Bile, MD Cardiology: Dr. Carolynne Citron HF Cardiology: Dr. Mitzie Anda  Chief complaint: CHF  78 y.o. with history of paroxysmal atrial fibrillation, COPD and chronic bronchiectasis, and diastolic CHF with prominent RV failure was referred by Dr. Carolynne Citron for evaluation of CHF and pulmonary hypertension. Patient is on 3-4 L home oxygen  and uses Bipap at night.  He quit smoking in 1985 but has COPD/chronic bronchitis.  He also has bronchiectasis with history of mucus plugging/lobar collapse.  He had a remote atrial fibrillation ablation and has been on dofetilide .  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   In 4/24, he tripped, fell, and broke several left-sided ribs.  He ended up with left lower lobe collapse due to mucus plugging.  This resolved over time. However, he has been considerably more short of breath since then.  Echo was done in 7/24, showing EF 55-60%, grade 2 diastolic dysfunction, moderate RV enlargement with mild RV dysfunction, PASP 96 mmHg, severe biatrial enlargement, mild-moderate MR, IVC dilated.  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    Echo was done today and reviewed, EF 60-65%, D-shaped septum suggestive of RV pressure/volume overload, moderate RV enlargement with mildly decreased RV systolic function, mild aortic stenosis with mean gradient 10 mmHg, moderate TR, PASP 70.   Today he returns for HF follow up. Weight is stable. He continues to wear 4L home oxygen .  He reports worsening exertional dyspnea.  He was mildly short of breath walking into the office today.  He is short of breath carrying a load.  Generally ok walking around his house.  He uses his Kardia device when he feels more short of breath and sometimes notes atrial fibrillation.  He is in NSR today.  He chronically elevates the head of his bed.  No lightheadedness, no chest pain.   St Jude PPM  interrogation (personally reviewed): 80% a-pacing 3.1% v-pacing, 2.2% AF burden  Labs (2/24): BNP 231, K 4, creatinine 1.04 Labs (9/24): LDL 73 Labs (10/24): K 4.8, creatinine 0.93 Labs (12/24): K 4.5, creatinine 1.11 Labs (2/25): LDL 109 Labs (4/25): LDL 74, K 4, creatinine 1.02  ECG (personally reviewed): A-paced, RBBB, QTc 516 msec  PMH: 1. Atrial fibrillation: Paroxysmal.  Remote AF ablation.  Now on dofetilide .  2. GERD 3. Type 2 diabetes 4. HTN 5. Hypothyroidism 6. Hyperlipidemia 7. COPD: Asthma/COPD overlap syndrome.  Chronic bronchitis.  Quit smoking 1985.  On home oxygen  3-4 L.   8. Chronic bronchiectasis 9. OSA: Uses Bipap 10. 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  - RHC (8/24): mean RA 4, PA 67/23 mean 37, mean PCWP 11, CI 2.13, PVR 5.7 WU - Echo (6/25): EF 60-65%, D-shaped septum suggestive of RV pressure/volume overload, moderate RV enlargement with mildly decreased RV systolic function, mild aortic stenosis with mean gradient 10 mmHg, moderate TR, PASP 70.  11. Sick sinus syndrome: St Jude PPM 12. CAD: Coronary angiography (8/24) with 50% dLCx, 40% pLAD.  13. Aortic stenosis: Mild on last echo.   Social History   Socioeconomic History   Marital status: Widowed    Spouse name: Not on file   Number of children: Not on file   Years of education: Not on file   Highest education level: Not on  file  Occupational History   Not on file  Tobacco Use   Smoking status: Former    Current packs/day: 0.00    Average packs/day: 3.0 packs/day for 26.0 years (77.9 ttl pk-yrs)    Types: Cigarettes    Start date: 02/20/1958    Quit date: 02/12/1984    Years since quitting: 40.3   Smokeless tobacco: Former    Types: Chew   Tobacco comments:    Former smoker 08/30/2021  Vaping Use    Vaping status: Never Used  Substance and Sexual Activity   Alcohol  use: Yes    Alcohol /week: 2.0 - 4.0 standard drinks of alcohol     Types: 1 - 2 Glasses of wine, 1 - 2 Cans of beer per week    Comment: 1 glass of wine or beer 3 times a week 12/26/22   Drug use: No   Sexual activity: Not Currently  Other Topics Concern   Not on file  Social History Narrative   Regular exercise: No   Social Drivers of Corporate investment banker Strain: Not on file  Food Insecurity: No Food Insecurity (04/06/2024)   Hunger Vital Sign    Worried About Running Out of Food in the Last Year: Never true    Ran Out of Food in the Last Year: Never true  Transportation Needs: No Transportation Needs (04/06/2024)   PRAPARE - Administrator, Civil Service (Medical): No    Lack of Transportation (Non-Medical): No  Physical Activity: Not on file  Stress: Not on file  Social Connections: Socially Isolated (04/06/2024)   Social Connection and Isolation Panel    Frequency of Communication with Friends and Family: More than three times a week    Frequency of Social Gatherings with Friends and Family: More than three times a week    Attends Religious Services: Never    Database administrator or Organizations: No    Attends Banker Meetings: Never    Marital Status: Widowed  Intimate Partner Violence: Not At Risk (04/06/2024)   Humiliation, Afraid, Rape, and Kick questionnaire    Fear of Current or Ex-Partner: No    Emotionally Abused: No    Physically Abused: No    Sexually Abused: No   Family History  Problem Relation Age of Onset   Liver cancer Mother    Pancreatic cancer Mother    Colon cancer Mother    Hypertension Father    Stroke Father    Other Father 25       Sudden Cardiac death   Heart attack Father    Cancer Sister        brain   Diabetes Sister    Colon cancer Maternal Aunt    Colon polyps Neg Hx    ROS: All systems reviewed and negative except as per HPI.    Current Outpatient Medications  Medication Sig Dispense Refill   acetaminophen  (TYLENOL ) 500 MG tablet Take 1,000 mg by mouth 3 (three) times daily.     albuterol  (PROVENTIL ) (2.5 MG/3ML) 0.083% nebulizer solution Take 3 mLs (2.5 mg total) by nebulization every 6 (six) hours as needed for wheezing or shortness of breath. 90 mL 6   albuterol  (VENTOLIN  HFA) 108 (90 Base) MCG/ACT inhaler INHALE 2 PUFFS INTO THE LUNGS EVERY 4 HOURS AS NEEDED, ONLY IF YOU CAN'T CATCH YOUR BREATH (Patient taking differently: 1-2 puffs every 6 (six) hours as needed.) 18 g 5   atorvastatin  (LIPITOR) 80 MG tablet Take 1  tablet (80 mg total) by mouth daily. 30 tablet 11   bisoprolol  (ZEBETA ) 5 MG tablet TAKE 1 AND 1/2 TABLET BY MOUTH TWICE DAILY. (Patient taking differently: Take 5 mg by mouth daily.) 90 tablet 3   BREZTRI  AEROSPHERE 160-9-4.8 MCG/ACT AERO INHALE 2 PUFFS INTO THE LUNGS TWICE DAILY. 10.7 g 5   Carboxymethylcellul-Glycerin (LUBRICATING EYE DROPS OP) Place 1 drop into the right eye 2 (two) times a week. Clear eyes as needed     diltiazem  (CARDIZEM  CD) 120 MG 24 hr capsule TAKE (1) CAPSULE BY MOUTH DAILY, MAY TAKE A EXTRA CAPSULE DAILY FOR BREAKTHROUGH AFIB. (Patient taking differently: Take 120 mg by mouth daily.) 180 capsule 3   dofetilide  (TIKOSYN ) 250 MCG capsule TAKE ONE CAPSULE BY MOUTH TWICE DAILY 60 capsule 3   Dupilumab  (DUPIXENT ) 300 MG/2ML SOAJ Inject 300 mg into the skin every 14 (fourteen) days. 12 mL 1   Ensifentrine  (OHTUVAYRE ) 3 MG/2.5ML SUSP Take 3 mg by nebulization 2 (two) times daily.     EPINEPHrine  0.3 mg/0.3 mL IJ SOAJ injection Inject 0.3 mg into the muscle as needed for anaphylaxis. 1 each 5   fluticasone  (FLONASE ) 50 MCG/ACT nasal spray Place 2 sprays into both nostrils daily. 16 g 6   glipiZIDE  (GLUCOTROL  XL) 5 MG 24 hr tablet Take 5 mg by mouth daily before breakfast.     JARDIANCE  25 MG TABS tablet Take 25 mg by mouth daily before breakfast.     levothyroxine  (SYNTHROID ) 150 MCG  tablet Take 150 mcg by mouth daily before breakfast.     losartan  (COZAAR ) 50 MG tablet Take 50 mg by mouth at bedtime.     metFORMIN  (GLUCOPHAGE -XR) 500 MG 24 hr tablet Take 500 mg by mouth in the morning and at bedtime.     Multiple Vitamin (MULTIVITAMIN) tablet Take 1 tablet by mouth daily.     omeprazole  (PRILOSEC  OTC) 20 MG tablet Take 20 mg by mouth every morning.     PRESCRIPTION MEDICATION 2 (two) times daily. Thumper vest     Respiratory Therapy Supplies (FLUTTER) DEVI Use as directed 1 each 0   rivaroxaban  (XARELTO ) 20 MG TABS tablet Take 20 mg by mouth at bedtime.     sodium chloride  (OCEAN) 0.65 % SOLN nasal spray Place 1 spray into both nostrils as needed for congestion.     spironolactone (ALDACTONE) 25 MG tablet Take 0.5 tablets (12.5 mg total) by mouth daily. 45 tablet 3   furosemide  (LASIX ) 40 MG tablet Take 1.5 tablets (60 mg total) by mouth 2 (two) times daily. 90 tablet 3   No current facility-administered medications for this encounter.   BP 118/70   Pulse 60   Wt 101.5 kg (223 lb 12.8 oz)   SpO2 94%   BMI 33.05 kg/m  General: NAD Neck: JVP difficult, no thyromegaly or thyroid  nodule.  Lungs: Occasional rhonchi. CV: Nondisplaced PMI.  Heart regular S1/S2, no S3/S4, 1/6 SEM RUSB.  1+ edema to knees.  No carotid bruit.  Normal pedal pulses.  Abdomen: Soft, nontender, no hepatosplenomegaly, mild distention.  Skin: Intact without lesions or rashes.  Neurologic: Alert and oriented x 3.  Psych: Normal affect. Extremities: No clubbing or cyanosis.  HEENT: Normal.   Assessment/Plan: 1. Atrial fibrillation: Paroxysmal.  NSR today.  Had remote AF ablation, now on dofetilide .  QTC prolonged at 516 msec today, would like < 550 msec with RBBB so ok.  A-paced today, 2.2% AF burden by device check.  Feels worse when AF occurs.  -  Continue dofetilide  250 mcg bid.  BMET/Mg today. - Continue Xarelto .  - He is on bisoprolol  and diltiazem  CD for rate control if AF recurs. 2. Sick  sinus syndrome: St Jude PPM.  - interrogation as above. 3. 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  Echo today showed EF 60-65%, D-shaped septum suggestive of RV pressure/volume overload, moderate RV enlargement with mildly decreased RV systolic function, mild aortic stenosis with mean gradient 10 mmHg, moderate TR, PASP 70.  NYHA class III symptoms, worse recently.  I do think he is at least mildly volume overloaded. - Increase Lasix  to 60 mg bid.  BMET/BNP today, BMET in 10 days.  - Continue Jardiance .   - Start spironolactone 12.5 mg daily.  4. COPD: Chronic bronchitis/asthma overlap syndrome, quit smoking in 1985.  On 4 L home oxygen .  - Continues to follow with pulmonary.  5. Chronic bronchiectasis: Has history of mucus plugging with lobar collapse.  6. OSA: Continue Bipap, no change. 7. CAD: Coronary angiography in 8/24 showed 50% dLCx, 40% pLAD.  Medical management. No chest pain. - No ASA given Xarelto  use.  - Goal LDL < 55, atorvastatin  increased a couple of months ago.  Check lipids/LFTs.  8. Aortic stenosis: Mild on today's echo.  9. 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  I do not think that he is a candidate for pulmonary vasodilators.   Follow up 1 month with APP.   I spent 32 minutes reviewing records, interviewing/examining patient, and managing orders.    Clarence Dawson  06/11/2024

## 2024-06-13 ENCOUNTER — Ambulatory Visit (HOSPITAL_COMMUNITY): Payer: Self-pay | Admitting: Cardiology

## 2024-06-13 ENCOUNTER — Encounter: Payer: Self-pay | Admitting: Pulmonary Disease

## 2024-06-13 DIAGNOSIS — J449 Chronic obstructive pulmonary disease, unspecified: Secondary | ICD-10-CM

## 2024-06-13 MED ORDER — OHTUVAYRE 3 MG/2.5ML IN SUSP: 3.0000 mg | Freq: Two times a day (BID) | RESPIRATORY_TRACT | 5 refills | Status: DC

## 2024-06-13 NOTE — Telephone Encounter (Signed)
 Please refer to Pharmacy team to see what needs to be done.

## 2024-06-13 NOTE — Telephone Encounter (Signed)
 Rx for Ohtuvayre  e-scribed to [facility]  Geraldene Kleine, PharmD, MPH, BCPS, CPP Clinical Pharmacist (Rheumatology and Pulmonology)

## 2024-06-16 NOTE — Progress Notes (Signed)
 Remote pacemaker transmission.

## 2024-06-20 ENCOUNTER — Ambulatory Visit (HOSPITAL_COMMUNITY)
Admission: RE | Admit: 2024-06-20 | Discharge: 2024-06-20 | Disposition: A | Discharge status: Home / Self Care | Source: Ambulatory Visit | Attending: Internal Medicine

## 2024-06-20 ENCOUNTER — Other Ambulatory Visit: Payer: Self-pay

## 2024-06-20 ENCOUNTER — Encounter (INDEPENDENT_AMBULATORY_CARE_PROVIDER_SITE_OTHER): Payer: Self-pay

## 2024-06-20 ENCOUNTER — Ambulatory Visit (HOSPITAL_COMMUNITY): Payer: Self-pay | Admitting: Cardiology

## 2024-06-20 DIAGNOSIS — E782 Mixed hyperlipidemia: Secondary | ICD-10-CM | POA: Diagnosis not present

## 2024-06-20 DIAGNOSIS — I5032 Chronic diastolic (congestive) heart failure: Secondary | ICD-10-CM | POA: Diagnosis present

## 2024-06-20 LAB — LIPID PANEL
Cholesterol: 142 mg/dL (ref 0–200)
HDL: 36 mg/dL — ABNORMAL LOW (ref >40)
LDL Cholesterol: 75 mg/dL (ref 0–99)
Total CHOL/HDL Ratio: 3.9 ratio
Triglycerides: 155 mg/dL — ABNORMAL HIGH (ref <150)
VLDL: 31 mg/dL (ref 0–40)

## 2024-06-20 LAB — BASIC METABOLIC PANEL WITH GFR
Anion gap: 8 (ref 5–15)
BUN: 22 mg/dL (ref 8–23)
CO2: 30 mmol/L (ref 22–32)
Calcium: 9.3 mg/dL (ref 8.9–10.3)
Chloride: 102 mmol/L (ref 98–111)
Creatinine, Ser: 1.14 mg/dL (ref 0.61–1.24)
GFR, Estimated: >60 mL/min (ref >60)
Glucose, Bld: 141 mg/dL — ABNORMAL HIGH (ref 70–99)
Potassium: 4.7 mmol/L (ref 3.5–5.1)
Sodium: 140 mmol/L (ref 135–145)

## 2024-06-20 LAB — HEPATIC FUNCTION PANEL
ALT: 26 U/L (ref 0–44)
AST: 21 U/L (ref 15–41)
Albumin: 3.5 g/dL (ref 3.5–5.0)
Alkaline Phosphatase: 67 U/L (ref 38–126)
Bilirubin, Direct: 0.2 mg/dL (ref 0.0–0.2)
Indirect Bilirubin: 0.8 mg/dL (ref 0.3–0.9)
Total Bilirubin: 1 mg/dL (ref 0.0–1.2)
Total Protein: 6.7 g/dL (ref 6.5–8.1)

## 2024-06-20 MED ORDER — EZETIMIBE 10 MG PO TABS: 10.0000 mg | ORAL_TABLET | Freq: Every day | ORAL | 3 refills | Status: AC

## 2024-06-20 NOTE — Telephone Encounter (Signed)
 Pt aware, agreeable, and verbalized understanding Rx sent

## 2024-06-20 NOTE — Progress Notes (Signed)
 Specialty Pharmacy Refill Coordination Note  Clarence Dawson is a 78 y.o. male contacted today regarding refills of specialty medication(s) Dupilumab  (Dupixent )   Patient requested [facility] at [facility] at [place] date: 06/27/24   Medication will be filled on 06/24/24.

## 2024-06-27 ENCOUNTER — Other Ambulatory Visit (HOSPITAL_COMMUNITY): Payer: Self-pay

## 2024-06-28 ENCOUNTER — Telehealth: Payer: Self-pay

## 2024-06-28 DIAGNOSIS — G4733 Obstructive sleep apnea (adult) (pediatric): Secondary | ICD-10-CM

## 2024-06-28 DIAGNOSIS — J4489 Other specified chronic obstructive pulmonary disease: Secondary | ICD-10-CM

## 2024-06-28 DIAGNOSIS — G4734 Idiopathic sleep related nonobstructive alveolar hypoventilation: Secondary | ICD-10-CM

## 2024-06-28 DIAGNOSIS — J9611 Chronic respiratory failure with hypoxia: Secondary | ICD-10-CM

## 2024-06-28 NOTE — Telephone Encounter (Unsigned)
 Copied from CRM [id number]. Topic: Clinical - Order For Equipment >> Jun 28, 2024  3:53 PM Russell PARAS wrote: Reason for CRM:   Krystal, with [facility], is contacting clinic to request an order for oxygen  be faxed to their facility.  FX# [phone]

## 2024-06-28 NOTE — Telephone Encounter (Signed)
 He has been on oxygen  all along.  This must be that his insurance changed DME providers.  I have not received a CMN to sign.

## 2024-07-04 ENCOUNTER — Encounter: Payer: Self-pay | Admitting: Internal Medicine

## 2024-07-08 ENCOUNTER — Telehealth (HOSPITAL_COMMUNITY): Payer: Self-pay | Admitting: *Deleted

## 2024-07-08 NOTE — Progress Notes (Signed)
 PCP: Sheryle Carwin, MD Cardiology: Dr. Waddell HF Cardiology: Dr. Rolan  Chief complaint: CHF  78 y.o. with history of paroxysmal atrial fibrillation, COPD and chronic bronchiectasis, and diastolic CHF with prominent RV failure was referred by Dr. Waddell for evaluation of CHF and pulmonary hypertension. Patient is on 3-4 L home oxygen  and uses Bipap at night.  He quit smoking in 1985 but has COPD/chronic bronchitis.  He also has bronchiectasis with history of mucus plugging/lobar collapse.  He had a remote atrial fibrillation ablation and has been on dofetilide .  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   In 4/24, he tripped, fell, and broke several left-sided ribs.  He ended up with left lower lobe collapse due to mucus plugging.  This resolved over time. However, he has been considerably more short of breath since then.  Echo was done in 7/24, showing EF 55-60%, grade 2 diastolic dysfunction, moderate RV enlargement with mild RV dysfunction, PASP 96 mmHg, severe biatrial enlargement, mild-moderate MR, IVC dilated.  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    Echo was done today and reviewed, EF 60-65%, D-shaped septum suggestive of RV pressure/volume overload, moderate RV enlargement with mildly decreased RV systolic function, mild aortic stenosis with mean gradient 10 mmHg, moderate TR, PASP 70.   Today he returns for HF follow up. Weight is stable. He continues to wear 4L home oxygen .  He reports worsening exertional dyspnea.  He was mildly short of breath walking into the office today.  He is short of breath carrying a load.  Generally ok walking around his house.  He uses his Kardia device when he feels more short of breath and sometimes notes atrial fibrillation.  He is in NSR today.  He chronically elevates the head of his bed.  No lightheadedness, no chest pain.   St Jude PPM  interrogation (personally reviewed): 80% a-pacing 3.1% v-pacing, 2.2% AF burden  Labs (2/24): BNP 231, K 4, creatinine 1.04 Labs (9/24): LDL 73 Labs (10/24): K 4.8, creatinine 0.93 Labs (12/24): K 4.5, creatinine 1.11 Labs (2/25): LDL 109 Labs (4/25): LDL 74, K 4, creatinine 1.02  ECG (personally reviewed): A-paced, RBBB, QTc 516 msec  PMH: 1. Atrial fibrillation: Paroxysmal.  Remote AF ablation.  Now on dofetilide .  2. GERD 3. Type 2 diabetes 4. HTN 5. Hypothyroidism 6. Hyperlipidemia 7. COPD: Asthma/COPD overlap syndrome.  Chronic bronchitis.  Quit smoking 1985.  On home oxygen  3-4 L.   8. Chronic bronchiectasis 9. OSA: Uses Bipap 10. 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  - RHC (8/24): mean RA 4, PA 67/23 mean 37, mean PCWP 11, CI 2.13, PVR 5.7 WU - Echo (6/25): EF 60-65%, D-shaped septum suggestive of RV pressure/volume overload, moderate RV enlargement with mildly decreased RV systolic function, mild aortic stenosis with mean gradient 10 mmHg, moderate TR, PASP 70.  11. Sick sinus syndrome: St Jude PPM 12. CAD: Coronary angiography (8/24) with 50% dLCx, 40% pLAD.  13. Aortic stenosis: Mild on last echo.   Social History   Socioeconomic History   Marital status: Widowed    Spouse name: Not on file   Number of children: Not on file   Years of education: Not on file   Highest education level: Not on  file  Occupational History   Not on file  Tobacco Use   Smoking status: Former    Current packs/day: 0.00    Average packs/day: 3.0 packs/day for 26.0 years (77.9 ttl pk-yrs)    Types: Cigarettes    Start date: 02/20/1958    Quit date: 02/12/1984    Years since quitting: 40.4   Smokeless tobacco: Former    Types: Chew   Tobacco comments:    Former smoker 08/30/2021  Vaping Use    Vaping status: Never Used  Substance and Sexual Activity   Alcohol  use: Yes    Alcohol /week: 2.0 - 4.0 standard drinks of alcohol     Types: 1 - 2 Glasses of wine, 1 - 2 Cans of beer per week    Comment: 1 glass of wine or beer 3 times a week 12/26/22   Drug use: No   Sexual activity: Not Currently  Other Topics Concern   Not on file  Social History Narrative   Regular exercise: No   Social Drivers of Corporate investment banker Strain: Not on file  Food Insecurity: No Food Insecurity (04/06/2024)   Hunger Vital Sign    Worried About Running Out of Food in the Last Year: Never true    Ran Out of Food in the Last Year: Never true  Transportation Needs: No Transportation Needs (04/06/2024)   PRAPARE - Administrator, Civil Service (Medical): No    Lack of Transportation (Non-Medical): No  Physical Activity: Not on file  Stress: Not on file  Social Connections: Socially Isolated (04/06/2024)   Social Connection and Isolation Panel    Frequency of Communication with Friends and Family: More than three times a week    Frequency of Social Gatherings with Friends and Family: More than three times a week    Attends Religious Services: Never    Database administrator or Organizations: No    Attends Banker Meetings: Never    Marital Status: Widowed  Intimate Partner Violence: Not At Risk (04/06/2024)   Humiliation, Afraid, Rape, and Kick questionnaire    Fear of Current or Ex-Partner: No    Emotionally Abused: No    Physically Abused: No    Sexually Abused: No   Family History  Problem Relation Age of Onset   Liver cancer Mother    Pancreatic cancer Mother    Colon cancer Mother    Hypertension Father    Stroke Father    Other Father 72       Sudden Cardiac death   Heart attack Father    Cancer Sister        brain   Diabetes Sister    Colon cancer Maternal Aunt    Colon polyps Neg Hx    ROS: All systems reviewed and negative except as per HPI.    Current Outpatient Medications  Medication Sig Dispense Refill   acetaminophen  (TYLENOL ) 500 MG tablet Take 1,000 mg by mouth 3 (three) times daily.     albuterol  (PROVENTIL ) (2.5 MG/3ML) 0.083% nebulizer solution Take 3 mLs (2.5 mg total) by nebulization every 6 (six) hours as needed for wheezing or shortness of breath. 90 mL 6   albuterol  (VENTOLIN  HFA) 108 (90 Base) MCG/ACT inhaler INHALE 2 PUFFS INTO THE LUNGS EVERY 4 HOURS AS NEEDED, ONLY IF YOU CAN'T CATCH YOUR BREATH (Patient taking differently: 1-2 puffs every 6 (six) hours as needed.) 18 g 5   atorvastatin  (LIPITOR) 80 MG tablet Take 1  tablet (80 mg total) by mouth daily. 30 tablet 11   bisoprolol  (ZEBETA ) 5 MG tablet TAKE 1 AND 1/2 TABLET BY MOUTH TWICE DAILY. (Patient taking differently: Take 5 mg by mouth daily.) 90 tablet 3   BREZTRI  AEROSPHERE 160-9-4.8 MCG/ACT AERO INHALE 2 PUFFS INTO THE LUNGS TWICE DAILY. 10.7 g 5   Carboxymethylcellul-Glycerin (LUBRICATING EYE DROPS OP) Place 1 drop into the right eye 2 (two) times a week. Clear eyes as needed     diltiazem  (CARDIZEM  CD) 120 MG 24 hr capsule TAKE (1) CAPSULE BY MOUTH DAILY, MAY TAKE A EXTRA CAPSULE DAILY FOR BREAKTHROUGH AFIB. (Patient taking differently: Take 120 mg by mouth daily.) 180 capsule 3   dofetilide  (TIKOSYN ) 250 MCG capsule TAKE ONE CAPSULE BY MOUTH TWICE DAILY 60 capsule 3   Dupilumab  (DUPIXENT ) 300 MG/2ML SOAJ Inject 300 mg into the skin every 14 (fourteen) days. 12 mL 1   Ensifentrine  (OHTUVAYRE ) 3 MG/2.5ML SUSP Take 3 mg by nebulization 2 (two) times daily. 150 mL 5   EPINEPHrine  0.3 mg/0.3 mL IJ SOAJ injection Inject 0.3 mg into the muscle as needed for anaphylaxis. 1 each 5   ezetimibe  (ZETIA ) 10 MG tablet Take 1 tablet (10 mg total) by mouth daily. 90 tablet 3   fluticasone  (FLONASE ) 50 MCG/ACT nasal spray Place 2 sprays into both nostrils daily. 16 g 6   furosemide  (LASIX ) 40 MG tablet Take 1.5 tablets (60 mg total) by mouth 2 (two) times daily. 90 tablet 3    glipiZIDE  (GLUCOTROL  XL) 5 MG 24 hr tablet Take 5 mg by mouth daily before breakfast.     JARDIANCE  25 MG TABS tablet Take 25 mg by mouth daily before breakfast.     levothyroxine  (SYNTHROID ) 150 MCG tablet Take 150 mcg by mouth daily before breakfast.     losartan  (COZAAR ) 50 MG tablet Take 50 mg by mouth at bedtime.     metFORMIN  (GLUCOPHAGE -XR) 500 MG 24 hr tablet Take 500 mg by mouth in the morning and at bedtime.     Multiple Vitamin (MULTIVITAMIN) tablet Take 1 tablet by mouth daily.     omeprazole  (PRILOSEC  OTC) 20 MG tablet Take 20 mg by mouth every morning.     PRESCRIPTION MEDICATION 2 (two) times daily. Thumper vest     Respiratory Therapy Supplies (FLUTTER) DEVI Use as directed 1 each 0   rivaroxaban  (XARELTO ) 20 MG TABS tablet Take 20 mg by mouth at bedtime.     sodium chloride  (OCEAN) 0.65 % SOLN nasal spray Place 1 spray into both nostrils as needed for congestion.     spironolactone  (ALDACTONE ) 25 MG tablet Take 0.5 tablets (12.5 mg total) by mouth daily. 45 tablet 3   No current facility-administered medications for this visit.   There were no vitals taken for this visit. General: NAD Neck: JVP difficult, no thyromegaly or thyroid  nodule.  Lungs: Occasional rhonchi. CV: Nondisplaced PMI.  Heart regular S1/S2, no S3/S4, 1/6 SEM RUSB.  1+ edema to knees.  No carotid bruit.  Normal pedal pulses.  Abdomen: Soft, nontender, no hepatosplenomegaly, mild distention.  Skin: Intact without lesions or rashes.  Neurologic: Alert and oriented x 3.  Psych: Normal affect. Extremities: No clubbing or cyanosis.  HEENT: Normal.   Assessment/Plan: 1. Atrial fibrillation: Paroxysmal.  NSR today.  Had remote AF ablation, now on dofetilide .  QTC prolonged at 516 msec today, would like < 550 msec with RBBB so ok.  A-paced today, 2.2% AF burden by device check.  Feels worse  when AF occurs.  - Continue dofetilide  250 mcg bid.  BMET/Mg today. - Continue Xarelto .  - He is on bisoprolol  and  diltiazem  CD for rate control if AF recurs. 2. Sick sinus syndrome: St Jude PPM.  - interrogation as above. 3. 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  Echo today showed EF 60-65%, D-shaped septum suggestive of RV pressure/volume overload, moderate RV enlargement with mildly decreased RV systolic function, mild aortic stenosis with mean gradient 10 mmHg, moderate TR, PASP 70.  NYHA class III symptoms, worse recently.  I do think he is at least mildly volume overloaded. - Increase Lasix  to 60 mg bid.  BMET/BNP today, BMET in 10 days.  - Continue Jardiance .   - Start spironolactone  12.5 mg daily.  4. COPD: Chronic bronchitis/asthma overlap syndrome, quit smoking in 1985.  On 4 L home oxygen .  - Continues to follow with pulmonary.  5. Chronic bronchiectasis: Has history of mucus plugging with lobar collapse.  6. OSA: Continue Bipap, no change. 7. CAD: Coronary angiography in 8/24 showed 50% dLCx, 40% pLAD.  Medical management. No chest pain. - No ASA given Xarelto  use.  - Goal LDL < 55, atorvastatin  increased a couple of months ago.  Check lipids/LFTs.  8. Aortic stenosis: Mild on today's echo.  9. 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  I do not think that he is a candidate for pulmonary vasodilators.   Follow up 1 month with APP.   I spent 32 minutes reviewing records, interviewing/examining patient, and managing orders.    Harlene HERO [facility]  07/08/2024

## 2024-07-08 NOTE — Telephone Encounter (Signed)
 Called to confirm/remind patient of their appointment at [facility] on 07/11/24.      Appointment:              [x] Confirmed             [] Left mess              [] No answer/No voice mail             [] Phone not in service   Patient reminded to bring all medications and/or complete list.   Confirmed patient has transportation. Gave directions, instructed to utilize valet parking.

## 2024-07-11 ENCOUNTER — Encounter (HOSPITAL_COMMUNITY): Payer: Self-pay

## 2024-07-11 ENCOUNTER — Encounter: Payer: Self-pay | Admitting: Pulmonary Disease

## 2024-07-11 ENCOUNTER — Ambulatory Visit (HOSPITAL_COMMUNITY)
Admission: RE | Admit: 2024-07-11 | Discharge: 2024-07-11 | Disposition: A | Discharge status: Home / Self Care | Source: Ambulatory Visit | Attending: Family Medicine

## 2024-07-11 VITALS — BP 122/78 | HR 60 | Ht 69.0 in | Wt 219.0 lb

## 2024-07-11 DIAGNOSIS — I272 Pulmonary hypertension, unspecified: Secondary | ICD-10-CM | POA: Insufficient documentation

## 2024-07-11 DIAGNOSIS — I2510 Atherosclerotic heart disease of native coronary artery without angina pectoris: Secondary | ICD-10-CM | POA: Diagnosis not present

## 2024-07-11 DIAGNOSIS — J4489 Other specified chronic obstructive pulmonary disease: Secondary | ICD-10-CM | POA: Insufficient documentation

## 2024-07-11 DIAGNOSIS — G4733 Obstructive sleep apnea (adult) (pediatric): Secondary | ICD-10-CM | POA: Diagnosis not present

## 2024-07-11 DIAGNOSIS — I350 Nonrheumatic aortic (valve) stenosis: Secondary | ICD-10-CM | POA: Diagnosis not present

## 2024-07-11 DIAGNOSIS — I480 Paroxysmal atrial fibrillation: Secondary | ICD-10-CM | POA: Diagnosis present

## 2024-07-11 DIAGNOSIS — Z7984 Long term (current) use of oral hypoglycemic drugs: Secondary | ICD-10-CM | POA: Diagnosis not present

## 2024-07-11 DIAGNOSIS — Z87891 Personal history of nicotine dependence: Secondary | ICD-10-CM | POA: Diagnosis not present

## 2024-07-11 DIAGNOSIS — Z950 Presence of cardiac pacemaker: Secondary | ICD-10-CM | POA: Diagnosis not present

## 2024-07-11 DIAGNOSIS — I495 Sick sinus syndrome: Secondary | ICD-10-CM | POA: Insufficient documentation

## 2024-07-11 DIAGNOSIS — J9611 Chronic respiratory failure with hypoxia: Secondary | ICD-10-CM | POA: Diagnosis not present

## 2024-07-11 DIAGNOSIS — I4819 Other persistent atrial fibrillation: Secondary | ICD-10-CM | POA: Diagnosis not present

## 2024-07-11 DIAGNOSIS — J479 Bronchiectasis, uncomplicated: Secondary | ICD-10-CM | POA: Insufficient documentation

## 2024-07-11 DIAGNOSIS — I50812 Chronic right heart failure: Secondary | ICD-10-CM | POA: Diagnosis not present

## 2024-07-11 DIAGNOSIS — I5032 Chronic diastolic (congestive) heart failure: Secondary | ICD-10-CM | POA: Insufficient documentation

## 2024-07-11 DIAGNOSIS — Z7901 Long term (current) use of anticoagulants: Secondary | ICD-10-CM | POA: Insufficient documentation

## 2024-07-11 DIAGNOSIS — J449 Chronic obstructive pulmonary disease, unspecified: Secondary | ICD-10-CM | POA: Diagnosis present

## 2024-07-11 DIAGNOSIS — I5022 Chronic systolic (congestive) heart failure: Secondary | ICD-10-CM | POA: Diagnosis present

## 2024-07-11 DIAGNOSIS — Z9981 Dependence on supplemental oxygen: Secondary | ICD-10-CM | POA: Diagnosis not present

## 2024-07-11 MED ORDER — AMOXICILLIN-POT CLAVULANATE 875-125 MG PO TABS: 1.0000 | ORAL_TABLET | Freq: Two times a day (BID) | ORAL | 0 refills | Status: AC

## 2024-07-11 NOTE — Telephone Encounter (Signed)
 I sent for some Augmentin  to his pharmacy.  Have him call us  if this does not clear his sputum.

## 2024-07-11 NOTE — Patient Instructions (Signed)
 No change in medications today. Return for labs in 6 - 8 weeks - see below. Please have nothing to eat or drink prior to your labs being drawn that day.  Please call Dr. Gonzolaz and make appointment. Return to see Dr. Rolan in 6 months. PLEASE CALL [phone] IN DECEMBER TO SCHEDULE THIS APPOINTMENT. Call [phone] if any questions or concerns prior to your next visit.

## 2024-07-15 ENCOUNTER — Other Ambulatory Visit (HOSPITAL_COMMUNITY): Payer: Self-pay

## 2024-07-15 ENCOUNTER — Encounter (INDEPENDENT_AMBULATORY_CARE_PROVIDER_SITE_OTHER): Payer: Self-pay

## 2024-07-15 ENCOUNTER — Other Ambulatory Visit: Payer: Self-pay

## 2024-07-15 NOTE — Progress Notes (Signed)
 Specialty Pharmacy Refill Coordination Note  MyChart Questionnaire Submission  Clarence Dawson is a 78 y.o. male contacted today regarding refills of specialty medication(s) Dupixent .  Injection date: (Patient-Rptd) 07/29/24  Doses on hand: (Patient-Rptd) Zero    Patient requested: (Patient-Rptd) Pickup at [facility] at [facility] date: (Patient-Rptd) 07/22/24  Medication will be filled on 07/21/24.

## 2024-07-21 ENCOUNTER — Other Ambulatory Visit: Payer: Self-pay

## 2024-07-24 ENCOUNTER — Other Ambulatory Visit: Payer: Self-pay | Admitting: Cardiology

## 2024-08-09 ENCOUNTER — Other Ambulatory Visit: Payer: Self-pay

## 2024-08-09 ENCOUNTER — Other Ambulatory Visit: Payer: Self-pay | Admitting: Pharmacy Technician

## 2024-08-09 ENCOUNTER — Encounter (INDEPENDENT_AMBULATORY_CARE_PROVIDER_SITE_OTHER): Payer: Self-pay

## 2024-08-09 NOTE — Progress Notes (Signed)
 Specialty Pharmacy Refill Coordination Note  Clarence Dawson is a 78 y.o. male contacted today regarding refills of specialty medication(s) Dupilumab  (Dupixent )   Patient requested [facility] at [facility] at [place] date: 08/19/24   Medication will be filled on 08/19/24.  Patient answered Questionnaire.

## 2024-08-12 ENCOUNTER — Ambulatory Visit (INDEPENDENT_AMBULATORY_CARE_PROVIDER_SITE_OTHER): Payer: Medicare Other

## 2024-08-12 DIAGNOSIS — I495 Sick sinus syndrome: Secondary | ICD-10-CM | POA: Diagnosis not present

## 2024-08-15 LAB — CUP PACEART REMOTE DEVICE CHECK
Battery Remaining Longevity: 90 mo
Battery Remaining Percentage: 83 %
Battery Voltage: 2.99 V
Brady Statistic AP VP Percent: 1.2 %
Brady Statistic AP VS Percent: 85 %
Brady Statistic AS VP Percent: 1 %
Brady Statistic AS VS Percent: 9 %
Brady Statistic RA Percent Paced: 82 %
Brady Statistic RV Percent Paced: 2.4 %
Date Time Interrogation Session: 20250815020013
Implantable Lead Connection Status: 753985
Implantable Lead Connection Status: 753985
Implantable Lead Implant Date: 20051106
Implantable Lead Implant Date: 20051106
Implantable Lead Location: 753859
Implantable Lead Location: 753860
Implantable Pulse Generator Implant Date: 20230710
Lead Channel Impedance Value: 350 Ohm
Lead Channel Impedance Value: 650 Ohm
Lead Channel Pacing Threshold Amplitude: 0.75 V
Lead Channel Pacing Threshold Amplitude: 1 V
Lead Channel Pacing Threshold Pulse Width: 0.5 ms
Lead Channel Pacing Threshold Pulse Width: 0.5 ms
Lead Channel Sensing Intrinsic Amplitude: 12 mV
Lead Channel Sensing Intrinsic Amplitude: 2.8 mV
Lead Channel Setting Pacing Amplitude: 2 V
Lead Channel Setting Pacing Amplitude: 2.5 V
Lead Channel Setting Pacing Pulse Width: 0.5 ms
Lead Channel Setting Sensing Sensitivity: 2 mV
Pulse Gen Model: 2272
Pulse Gen Serial Number: 8099041

## 2024-08-16 ENCOUNTER — Ambulatory Visit: Payer: Self-pay | Admitting: Internal Medicine

## 2024-08-19 ENCOUNTER — Other Ambulatory Visit: Payer: Self-pay

## 2024-08-25 ENCOUNTER — Ambulatory Visit (HOSPITAL_COMMUNITY): Payer: Medicare Other | Admitting: Physician Assistant

## 2024-08-25 NOTE — Progress Notes (Incomplete)
 Primary Care Physician: Sheryle Carwin, MD Primary Electrophysiologist: Dr Waddell Referring Physician: Dr Kels[facility] - Eat: Dr Rolan [name] is a 78 y.o. male with a history of SSS s/p PPM, atrial fibrillation, chronic diastolic CHF, HTN, COPD, DM, and GERD who presents for follow up in the [facility]. Patient was recently admitted with elevated temp and AMS and was diagnosed with severe sepsis 2/2 pneumonia. During his admission, he developed afib with RVR. He was given a bolus of flecainide  which converted him to atrial flutter. Patient was fairly asymptomatic. He is on Xarelto  for a CHADS2VASC score of 3.  The device clinic received an alert for an ongoing afib episode starting 04/09/21. He did have two alcohol  drinks the night before and was also being treated for an URI. COVID test was negative. He was back in SR before follow up.  Patient was noted to be out of rhythm by the device clinic on 08/23/21 and underwent DCCV on 09/05/21. He had recurrent afib and underwent another DCCV on 08/06/22. The device clinic received an alert for an ongoing afib episode starting 12/07/22.   Patient is s/p dofetilide  loading 1/16-1/19/24. He converted with the medication and did not require DCCV.   Patient returns for follow up for atrial fibrillation and dofetilide  monitoring. ***  Today, he  denies symptoms of ***palpitations, chest pain, shortness of breath, orthopnea, PND, lower extremity edema, dizziness, presyncope, syncope, bleeding, or neurologic sequela. The patient is tolerating medications without difficulties and is otherwise without complaint today.    Atrial Fibrillation Risk Factors:  he does not have symptoms or diagnosis of sleep apnea. he does not have a history of rheumatic fever. he does not have a history of alcohol  use. The patient does not have a history of early familial atrial fibrillation or other arrhythmias.   Atrial Fibrillation Management  history:  Previous antiarrhythmic drugs: flecainide , dofetilide   Previous cardioversions: 11/2016, 09/05/21 Previous ablations: none Anticoagulation history: Xarelto    Past Medical History:  Diagnosis Date   Allergic rhinitis    Aortic valve disorder    Asthma    since childhood- seasonal allergies induced   Cancer (HCC)    Skin cancer- squamous, basal   Carotid artery stenosis    Essential hypertension    Full dentures    GERD (gastroesophageal reflux disease)    H/O hiatal hernia    Hemorrhage of rectum    Hyperlipidemia    Hypothyroidism    Male circumcision    OSA (obstructive sleep apnea)    Osteoarthritis    Pacemaker    Oct 2005 in [place].   PAF (paroxysmal atrial fibrillation) (HCC)    Pneumonia    several Times 2015 last time   Primary localized osteoarthritis of right knee 08/11/2017   RBBB (right bundle branch block)    Sinoatrial node dysfunction (HCC)    Syncope    Tricuspid valve disorder    Type 2 diabetes mellitus (HCC)    Type II    Current Outpatient Medications  Medication Sig Dispense Refill   acetaminophen  (TYLENOL ) 500 MG tablet Take 1,000 mg by mouth 3 (three) times daily.     albuterol  (PROVENTIL ) (2.5 MG/3ML) 0.083% nebulizer solution Take 3 mLs (2.5 mg total) by nebulization every 6 (six) hours as needed for wheezing or shortness of breath. 90 mL 6   albuterol  (VENTOLIN  HFA) 108 (90 Base) MCG/ACT inhaler INHALE 2 PUFFS INTO THE LUNGS EVERY 4 HOURS AS NEEDED, ONLY  IF YOU CAN'T CATCH YOUR BREATH 18 g 5   atorvastatin  (LIPITOR) 80 MG tablet Take 1 tablet (80 mg total) by mouth daily. 30 tablet 11   bisoprolol  (ZEBETA ) 5 MG tablet TAKE 1 AND 1/2 TABLET BY MOUTH TWICE DAILY. 90 tablet 3   BREZTRI  AEROSPHERE 160-9-4.8 MCG/ACT AERO INHALE 2 PUFFS INTO THE LUNGS TWICE DAILY. 10.7 g 5   Carboxymethylcellul-Glycerin (LUBRICATING EYE DROPS OP) Place 1 drop into the right eye 2 (two) times a week. Clear eyes as needed     diltiazem  (CARDIZEM  CD) 120 MG  24 hr capsule TAKE (1) CAPSULE BY MOUTH DAILY, MAY TAKE A EXTRA CAPSULE DAILY FOR BREAKTHROUGH AFIB. 180 capsule 3   dofetilide  (TIKOSYN ) 250 MCG capsule TAKE ONE CAPSULE BY MOUTH TWICE DAILY 60 capsule 3   Dupilumab  (DUPIXENT ) 300 MG/2ML SOAJ Inject 300 mg into the skin every 14 (fourteen) days. 12 mL 1   Ensifentrine  (OHTUVAYRE ) 3 MG/2.5ML SUSP Take 3 mg by nebulization 2 (two) times daily. 150 mL 5   EPINEPHrine  0.3 mg/0.3 mL IJ SOAJ injection Inject 0.3 mg into the muscle as needed for anaphylaxis. 1 each 5   ezetimibe  (ZETIA ) 10 MG tablet Take 1 tablet (10 mg total) by mouth daily. 90 tablet 3   fluticasone  (FLONASE ) 50 MCG/ACT nasal spray Place 2 sprays into both nostrils daily. 16 g 6   furosemide  (LASIX ) 40 MG tablet Take 1.5 tablets (60 mg total) by mouth 2 (two) times daily. 90 tablet 3   glipiZIDE  (GLUCOTROL  XL) 5 MG 24 hr tablet Take 5 mg by mouth daily before breakfast.     JARDIANCE  25 MG TABS tablet Take 25 mg by mouth daily before breakfast.     levothyroxine  (SYNTHROID ) 150 MCG tablet Take 150 mcg by mouth daily before breakfast.     losartan  (COZAAR ) 50 MG tablet Take 50 mg by mouth at bedtime.     metFORMIN  (GLUCOPHAGE -XR) 500 MG 24 hr tablet Take 500 mg by mouth in the morning and at bedtime.     Multiple Vitamin (MULTIVITAMIN) tablet Take 1 tablet by mouth daily.     omeprazole  (PRILOSEC  OTC) 20 MG tablet Take 20 mg by mouth every morning.     PRESCRIPTION MEDICATION 2 (two) times daily. Thumper vest     Respiratory Therapy Supplies (FLUTTER) DEVI Use as directed 1 each 0   rivaroxaban  (XARELTO ) 20 MG TABS tablet Take 20 mg by mouth at bedtime.     sodium chloride  (OCEAN) 0.65 % SOLN nasal spray Place 1 spray into both nostrils as needed for congestion.     spironolactone  (ALDACTONE ) 25 MG tablet Take 0.5 tablets (12.5 mg total) by mouth daily. 45 tablet 3   No current facility-administered medications for this visit.    ROS- All systems are reviewed and negative except  as per the HPI above.  Physical Exam: There were no vitals filed for this visit.   GEN: Well nourished, well developed in no acute distress NECK: No JVD; No carotid bruits CARDIAC: {EPRHYTHM:28826}, no murmurs, rubs, gallops RESPIRATORY:  Clear to auscultation without rales, wheezing or rhonchi  ABDOMEN: Soft, non-tender, non-distended EXTREMITIES:  No edema; No deformity    Wt Readings from Last 3 Encounters:  07/11/24 99.3 kg  06/10/24 101.5 kg  05/31/24 101.1 kg    EKG today demonstrates ***   Echo 06/10/24 demonstrated   1. Left ventricular ejection fraction, by estimation, is 60 to 65%. The  left ventricle has normal function. The left ventricle has  no regional  wall motion abnormalities. Left ventricular diastolic parameters were  normal.   2. Right ventricular systolic function is normal. The right ventricular  size is severely enlarged.   3. Right atrial size was severely dilated.   4. There is no evidence of cardiac tamponade.   5. The mitral valve is degenerative. Mild mitral valve regurgitation. No  evidence of mitral stenosis.   6. Tricuspid valve regurgitation is moderate.   7. The aortic valve is calcified. Unable to determine aortic valve  morphology due to image quality. Aortic valve regurgitation is not  visualized. Calcified leaflets, reduced leaflet excursion, mild to  moderate aortic stenosis based on DI (0.33) and AVA  (VTI) 1.15cm2. Peak velocity 2.49m/s and MG 10 mmHG.   8. The inferior vena cava is dilated in size with <50% respiratory  variability, suggesting right atrial pressure of 15 mmHg.   9. Ascending aorta measurements are within normal limits for age when  indexed to body surface area.   Comparison(s): A prior study was performed on 04/07/2024. LVEF remains  stable, RV function has improved, but RV size remains dilated.    Epic records are reviewed at length today   CHA2DS2-VASc Score = 6  The patient's score is based upon: CHF  History: 1 HTN History: 1 Diabetes History: 1 Stroke History: 0 Vascular Disease History: 1 Age Score: 2 Gender Score: 0   {Confirm score is correct.  If not, click here to update score.  REFRESH note.  :1}    ASSESSMENT AND PLAN: Persistent Atrial Fibrillation (ICD10:  I48.19) The patient's CHA2DS2-VASc score is 6, indicating a 9.7% annual risk of stroke.   Last device interrogation showed 1.7% afib burden Continue dofetilide  250 mcg BID Continue bisoprolol  7.5 mg daily Continue diltiazem  120 mg daily Continue Xarelto  20 mg daily  Secondary Hypercoagulable State (ICD10:  D68.69){Click to add to Prob List or Visit Dx  :[id number]} The patient is at significant risk for stroke/thromboembolism based upon his CHA2DS2-VASc Score of 6.  Continue Rivaroxaban  (Xarelto ). No bleeding issues.   High Risk Medication Monitoring (ICD 10: J342684) Patient requires ongoing monitoring for anti-arrhythmic medication which has the potential to cause life threatening arrhythmias. QT interval on ECG acceptable for dofetilide  monitoring. Check bmet/mag today. ***   SSS S/p PPM, followed by Dr Waddell ***  HTN Stable on current regimen ***  CAD No anginal symptoms Followed by Dr Rolan ***  Chronic HFpEF EF 60-65% GDMT per [facility] team Fluid status appears stable today ***   Follow up ***with Dr Waddell or EP APP in 3 months. AF clinic 6 months after that visit.    Clarence Kicks PA-C [facility] [address] [place], [place] [address] [phone] 08/25/2024 8:33 AM

## 2024-08-31 ENCOUNTER — Other Ambulatory Visit
Admission: RE | Admit: 2024-08-31 | Discharge: 2024-08-31 | Disposition: A | Discharge status: Home / Self Care | Source: Ambulatory Visit | Attending: Pulmonary Disease | Admitting: Pulmonary Disease

## 2024-08-31 ENCOUNTER — Encounter: Payer: Self-pay | Admitting: Pulmonary Disease

## 2024-08-31 ENCOUNTER — Other Ambulatory Visit: Payer: Self-pay

## 2024-08-31 ENCOUNTER — Ambulatory Visit: Admitting: Pulmonary Disease

## 2024-08-31 VITALS — BP 124/74 | HR 60 | Temp 97.7°F | Ht 69.0 in | Wt 210.0 lb

## 2024-08-31 DIAGNOSIS — J9611 Chronic respiratory failure with hypoxia: Secondary | ICD-10-CM

## 2024-08-31 DIAGNOSIS — J440 Chronic obstructive pulmonary disease with acute lower respiratory infection: Secondary | ICD-10-CM | POA: Insufficient documentation

## 2024-08-31 DIAGNOSIS — J471 Bronchiectasis with (acute) exacerbation: Secondary | ICD-10-CM

## 2024-08-31 DIAGNOSIS — J4489 Other specified chronic obstructive pulmonary disease: Secondary | ICD-10-CM

## 2024-08-31 LAB — EXPECTORATED SPUTUM ASSESSMENT W GRAM STAIN, RFLX TO RESP C

## 2024-08-31 MED ORDER — ALBUTEROL SULFATE (2.5 MG/3ML) 0.083% IN NEBU
2.5000 mg | INHALATION_SOLUTION | Freq: Once | RESPIRATORY_TRACT | Status: AC
Start: 2024-08-31 — End: 2024-08-31
  Administered 2024-08-31: 2.5 mg via RESPIRATORY_TRACT

## 2024-08-31 MED ORDER — PREDNISONE 10 MG PO TABS: 10.0000 mg | ORAL_TABLET | Freq: Every day | ORAL | 2 refills | Status: DC

## 2024-08-31 MED ORDER — MINOCYCLINE HCL 100 MG PO CAPS: 100.0000 mg | ORAL_CAPSULE | Freq: Two times a day (BID) | ORAL | 0 refills | Status: DC

## 2024-08-31 NOTE — Progress Notes (Signed)
 Subjective:    Patient ID: Clarence Dawson, male    DOB: [birth year], 78 y.o.   MRN: [id number]  Patient Care Team: Sheryle Carwin, MD as PCP - General (Internal Medicine) Waddell Danelle ORN, MD as PCP - Cardiology (Cardiology) Waddell Danelle ORN, MD as PCP - Electrophysiology (Cardiology) Tamea Dedra CROME, MD as Consulting Physician (Pulmonary Disease)  Chief Complaint  Patient presents with   COPD    Cough with green/red phlegm. Completed doxy and prednisone  in the last few days.     BACKGROUND/INTERVAL:Clarence Dawson is a very complex 78 year old former smoker (34 PY) who presents for follow-up from his most recent visit of 31 May 2024. 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  Prior issues with ABPA and Aspergillus colonization.  He has also been diagnosed with moderate pulmonary arterial hypertension (group 3), moderate nonobstructive coronary artery disease.  He also has a history of atrial fibrillation on Xarelto  and Tikosyn .  He has very complex issues as noted.  He completed pulmonary rehab.  He uses chest vest physiotherapy for management of his bronchiectasis.  COPD/asthma management is maximized. On the 17 December 2023 visit he was placed on minocycline  for stenotrophomonas infection (on Tikosyn  and cannot have QT prolonging medications such as Levaquin  and sulfa  medications increase Tikosyn  level).  He was last seen on 09 March 2024 and at that time required a second round of minocycline .  Sputum specimen since that second therapy has shown no stenotrophomonas.   HPI Discussed the use of AI scribe software for clinical note transcription with the patient, who gave verbal consent to proceed.  History of Present Illness   Clarence Dawson is a 78 year old male with COPD and asthma who presents with worsening respiratory symptoms.  Over the past two weeks, he has experienced a significant  worsening of respiratory symptoms. Despite using full-time oxygen , he has severe shortness of breath with minimal exertion, such as walking around the house, necessitating frequent rest. He was prescribed doxycycline  and prednisone  by primary care, which provided temporary relief.  His respiratory issues have been progressively worsening over time. He experiences episodes of dizziness and blurred vision in one eye after exertion. His sputum is described as green and red.  He has had no fevers, chills or sweats.  He uses a nebulizer regularly, including this morning, and follows a strict routine before leaving the house. He avoids going outside when air quality is poor due to smoke or pollution. He generally stays indoors due to his symptoms.  He has a history of chronic respiratory failure with hypoxia and has been on various treatments over the years.     He is currently on Breztri  2 puffs twice a day, as needed albuterol , Ohtuvayre  and Dupixent .  He uses vest physiotherapy for management of bronchiectasis.  Review of Systems A 10 point review of systems was performed and it is as noted above otherwise negative.   Patient Active Problem List   Diagnosis Date Noted   Lactic acidosis 04/07/2024   Elevated brain natriuretic peptide (BNP) level 04/07/2024   COPD (chronic obstructive pulmonary disease) (HCC) 04/07/2024   Elevated troponin 04/07/2024   Prolonged QT interval 04/07/2024   Atrial fibrillation, chronic (HCC) 04/07/2024   Acquired hypothyroidism 04/07/2024   Encounter for monitoring dofetilide  therapy 02/25/2024   Chronic respiratory failure with hypoxia (HCC) 06/12/2023   Hypercoagulable state due to persistent atrial fibrillation (HCC) 01/02/2020   Chronic  diastolic heart failure (HCC) 11/22/2019   Paroxysmal atrial fibrillation (HCC) 09/22/2019   Atrial fibrillation with RVR (HCC)    Lobar pneumonia (HCC) 09/21/2019   Acute respiratory failure with hypoxia (HCC) 09/21/2019    Acute on chronic respiratory failure with hypoxia (HCC) 09/18/2019   Elevated lactic acid level    GERD (gastroesophageal reflux disease)    Chronic bronchitis (HCC) 02/09/2019   Sepsis due to pneumonia (HCC) 03/03/2018   Primary localized osteoarthritis of right knee 08/11/2017   Asthma with acute exacerbation 12/08/2016   Chest pain 10/20/2016   Essential hypertension 10/20/2016   Morbid obesity due to excess calories (HCC) 06/24/2016   Severe persistent asthma 02/12/2016   Upper airway cough syndrome 01/24/2016   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   Persistent atrial fibrillation    Syncope    Hyperthyroidism    Hyperlipidemia    RBBB (right bundle branch block)    Tricuspid valve disorder    Aortic valve disorder    Carotid artery stenosis    Type 2 diabetes mellitus (HCC)    Pacemaker    OSA on CPAP    Arthritis    Sinoatrial node dysfunction (HCC)    PVC's (premature ventricular contractions) 07/17/2011   Essential hypertension, benign 01/24/2011   Premature ventricular contractions 01/24/2011   PPM-[facility] 01/24/2011    Social History   Tobacco Use   Smoking status: Former    Current packs/day: 0.00    Average packs/day: 3.0 packs/day for 26.0 years (77.9 ttl pk-yrs)    Types: Cigarettes    Start date: 02/20/1958    Quit date: 02/12/1984    Years since quitting: 40.5   Smokeless tobacco: Former    Types: Chew   Tobacco comments:    Former smoker 08/30/2021  Substance Use Topics   Alcohol  use: Yes    Alcohol /week: 2.0 - 4.0 standard drinks of alcohol     Types: 1 - 2 Glasses of wine, 1 - 2 Cans of beer per week    Comment: 1 glass of wine or beer 3 times a week 12/26/22    Allergies  Allergen Reactions   Food Anaphylaxis and Shortness Of Breath    TREE NUTS   Iodinated Contrast Media Hives and Shortness Of Breath    Patient states hives to throat closing. (01/15/17: patient states this reaction was about 20 years ago with possibly an IVP.  He now says high doses of  prednisone  throw me into AFib.  He has tolerated CT arthrograms with Benadrly 50mg  PO one hour before injection.  Clarence Loss, RN)   Shellfish Allergy  Anaphylaxis and Shortness Of Breath    To shellfish, crabs.  Makes him feel like things are crawling all over me.  Denies airway issues with these foods.  Arma Loss, RN 01/15/17)   Goat-Derived Products Hives    GOAT CHEESE    Prednisone  Palpitations    PRECIPITATES A-FIB   Diclofenac Sodium Other (See Comments)    Hives, buggy feeling all over   Metformin  And Related Diarrhea    High doses at once   Vancomycin  Anxiety    Red man syndrome   Voltaren [Diclofenac Sodium] Other (See Comments)    Feels like things are crawling on him    Current Meds  Medication Sig   acetaminophen  (TYLENOL ) 500 MG tablet Take 1,000 mg by mouth 3 (three) times daily.   albuterol  (PROVENTIL ) (2.5 MG/3ML) 0.083% nebulizer solution Take 3 mLs (2.5 mg total) by nebulization every 6 (six) hours as  needed for wheezing or shortness of breath.   albuterol  (VENTOLIN  HFA) 108 (90 Base) MCG/ACT inhaler INHALE 2 PUFFS INTO THE LUNGS EVERY 4 HOURS AS NEEDED, ONLY IF YOU CAN'T CATCH YOUR BREATH   atorvastatin  (LIPITOR) 80 MG tablet Take 1 tablet (80 mg total) by mouth daily.   bisoprolol  (ZEBETA ) 5 MG tablet TAKE 1 AND 1/2 TABLET BY MOUTH TWICE DAILY.   BREZTRI  AEROSPHERE 160-9-4.8 MCG/ACT AERO INHALE 2 PUFFS INTO THE LUNGS TWICE DAILY.   Carboxymethylcellul-Glycerin (LUBRICATING EYE DROPS OP) Place 1 drop into the right eye 2 (two) times a week. Clear eyes as needed   diltiazem  (CARDIZEM  CD) 120 MG 24 hr capsule TAKE (1) CAPSULE BY MOUTH DAILY, MAY TAKE A EXTRA CAPSULE DAILY FOR BREAKTHROUGH AFIB.   dofetilide  (TIKOSYN ) 250 MCG capsule TAKE ONE CAPSULE BY MOUTH TWICE DAILY   Dupilumab  (DUPIXENT ) 300 MG/2ML SOAJ Inject 300 mg into the skin every 14 (fourteen) days.   Ensifentrine  (OHTUVAYRE ) 3 MG/2.5ML SUSP Take 3 mg by nebulization 2 (two) times daily.    EPINEPHrine  0.3 mg/0.3 mL IJ SOAJ injection Inject 0.3 mg into the muscle as needed for anaphylaxis.   ezetimibe  (ZETIA ) 10 MG tablet Take 1 tablet (10 mg total) by mouth daily.   fluticasone  (FLONASE ) 50 MCG/ACT nasal spray Place 2 sprays into both nostrils daily.   furosemide  (LASIX ) 40 MG tablet Take 1.5 tablets (60 mg total) by mouth 2 (two) times daily.   glipiZIDE  (GLUCOTROL  XL) 5 MG 24 hr tablet Take 5 mg by mouth daily before breakfast.   JARDIANCE  25 MG TABS tablet Take 25 mg by mouth daily before breakfast.   levothyroxine  (SYNTHROID ) 150 MCG tablet Take 150 mcg by mouth daily before breakfast.   losartan  (COZAAR ) 50 MG tablet Take 50 mg by mouth at bedtime.   metFORMIN  (GLUCOPHAGE -XR) 500 MG 24 hr tablet Take 500 mg by mouth in the morning and at bedtime.   Multiple Vitamin (MULTIVITAMIN) tablet Take 1 tablet by mouth daily.   omeprazole  (PRILOSEC  OTC) 20 MG tablet Take 20 mg by mouth every morning.   PRESCRIPTION MEDICATION 2 (two) times daily. Thumper vest   Respiratory Therapy Supplies (FLUTTER) DEVI Use as directed   rivaroxaban  (XARELTO ) 20 MG TABS tablet Take 20 mg by mouth at bedtime.   sodium chloride  (OCEAN) 0.65 % SOLN nasal spray Place 1 spray into both nostrils as needed for congestion.   spironolactone  (ALDACTONE ) 25 MG tablet Take 0.5 tablets (12.5 mg total) by mouth daily.   minocycline  (MINOCIN ) 100 MG capsule Take 1 capsule (100 mg total) by mouth 2 (two) times daily.   predniSONE  (DELTASONE ) 10 MG tablet Take 1 tablet (10 mg total) by mouth daily with breakfast.    Immunization History  Administered Date(s) Administered   Fluad Quad(high Dose 65+) 09/12/2019, 10/15/2022   INFLUENZA, HIGH DOSE SEASONAL PF 09/28/2017   Influenza Split 11/14/2015, 10/11/2018   Influenza-Unspecified 11/02/2016, 09/03/2020, 10/03/2021, 10/23/2023   Moderna SARS-COV2 Booster Vaccination 08/20/2020, 03/04/2021   Moderna Sars-Covid-2 Vaccination 02/15/2020, 03/14/2020   Pneumococcal  Conjugate-13 09/28/2015   Tdap 11/28/2019        Objective:     BP 124/74   Pulse 60   Temp 97.7 F (36.5 C) (Temporal)   Ht 5' 9 (1.753 m)   Wt 210 lb (95.3 kg)   SpO2 92% Comment: 4L oxygen   BMI 31.01 kg/m   SpO2: 92 % (4L oxygen )  GENERAL: Well-developed, obese patient in no acute distress.  Mildly tachypneic on nasal cannula  O2.  He is fully ambulatory. HEAD: Normocephalic, atraumatic.  EYES: Pupils equal, round, reactive to light.  No scleral icterus.  MOUTH:   Upper and lower dentures.  Oral mucosa moist.  No thrush. NECK: Supple. No thyromegaly. Trachea midline. No JVD.  No adenopathy. PULMONARY: Good air entry bilaterally air entry symmetrical throughout.  Diffuse rhonchi throughout.  Scattered wheezes throughout.  CARDIOVASCULAR: S1 and S2.  Regular rate and rhythm,rate 60.  Grade 2/6 mitral regurgitation murmur, grade 3/6 aortic stenosis murmur. Pacemaker on left. GASTROINTESTINAL: Obese, otherwise benign. MUSCULOSKELETAL: No joint deformity, no clubbing, no edema.  NEUROLOGIC: No focal deficits, speech is fluent.  No gait disturbance noted.  Awake, alert and oriented. SKIN: Intact,warm,dry. PSYCH: Mood and behavior appropriate.   Patient received nebulization treatment with albuterol  2.5 mg / 3 mL, x 1: Marked improvement on rhonchi and wheezes.      Assessment & Plan:     ICD-10-CM   1. Asthma-COPD overlap syndrome (HCC)  J44.89 albuterol  (PROVENTIL ) (2.5 MG/3ML) 0.083% nebulizer solution 2.5 mg    2. Chronic respiratory failure with hypoxia (HCC)  J96.11 albuterol  (PROVENTIL ) (2.5 MG/3ML) 0.083% nebulizer solution 2.5 mg    3. COPD with acute lower respiratory infection (HCC)  J44.0 Respiratory or Resp and Sputum Culture      Orders Placed This Encounter  Procedures   Respiratory or Resp and Sputum Culture    Sputum for gram stain C&S    Standing Status:   Future    Expiration Date:   08/31/2025    Meds ordered this encounter  Medications    albuterol  (PROVENTIL ) (2.5 MG/3ML) 0.083% nebulizer solution 2.5 mg   minocycline  (MINOCIN ) 100 MG capsule    Sig: Take 1 capsule (100 mg total) by mouth 2 (two) times daily.    Dispense:  20 capsule    Refill:  0   predniSONE  (DELTASONE ) 10 MG tablet    Sig: Take 1 tablet (10 mg total) by mouth daily with breakfast.    Dispense:  30 tablet    Refill:  2   Discussion:    Chronic obstructive pulmonary disease (COPD) and asthma with chronic respiratory failure and hypoxia Chronic respiratory failure with hypoxia secondary to COPD and asthma. Symptoms include significant dyspnea even with full-time oxygen  use, and production of green and red sputum, indicating possible infection or exacerbation. Recent treatment with doxycycline  and prednisone  improved symptoms temporarily. Current symptoms suggest a decline in respiratory function, possibly due to recurrent infections or exacerbations. He experiences significant breathlessness with minimal exertion and has visual disturbances, possibly related to hypoxia. He is aware of the progressive nature of his condition and the need for ongoing management. Discussion included the need for ongoing prednisone  therapy despite potential joint issues, as respiratory management takes precedence. - Administer nebulizer treatment: Patient received albuterol  x 1 via nebulizer, experienced improvement in symptoms. - Prescribe prednisone  at maintenance dose of 10 mg daily.  - Consider Brinsupri therapy on follow-up visit. - Collected sputum for C&S. - Follow-up in 2 to 3 weeks time, call sooner should symptoms fail to improve or worsen in the interim.     Advised if symptoms do not improve or worsen, to please contact office for sooner follow up or seek emergency care.    I spent 40 minutes of dedicated to the care of this patient on the date of this encounter to include pre-visit review of records, face-to-face time with the patient discussing conditions above, post  visit ordering of testing, clinical documentation  with the electronic health record, making appropriate referrals as documented, and communicating necessary findings to members of the patients care team.     C. Leita Sanders, MD Advanced Bronchoscopy P[facility]    *This note was generated using voice recognition software/Dragon and/or AI transcription program.  Despite best efforts to proofread, errors can occur which can change the meaning. Any transcriptional errors that result from this process are unintentional and may not be fully corrected at the time of dictation.

## 2024-08-31 NOTE — Patient Instructions (Signed)
 VISIT SUMMARY:  You visited us  today due to worsening respiratory symptoms despite using full-time oxygen . You have been experiencing severe shortness of breath, dizziness, and blurred vision after exertion, along with green and red sputum. Your symptoms temporarily improved with doxycycline  and prednisone , but have since worsened again.  YOUR PLAN:  -CHRONIC OBSTRUCTIVE PULMONARY DISEASE (COPD) AND ASTHMA WITH CHRONIC RESPIRATORY FAILURE AND HYPOXIA: This condition involves long-term breathing problems and poor airflow, often due to smoking or other lung irritants. Your symptoms include significant shortness of breath, even with full-time oxygen  use, and green and red sputum, which may indicate an infection or exacerbation. We will continue managing your condition with a nebulizer treatment immediately and a prednisone  taper pack followed by a maintenance dose of 10 mg daily.  I have sent a prescription of minocycline  to your pharmacy.  We are also collecting sputum for culture.  INSTRUCTIONS:  Please follow the prescribed prednisone  taper pack and then continue with a maintenance dose of 10 mg daily. Use your nebulizer as directed. If your symptoms worsen or you experience new symptoms, contact our office immediately.

## 2024-09-01 ENCOUNTER — Other Ambulatory Visit: Payer: Self-pay | Admitting: Pulmonary Disease

## 2024-09-01 ENCOUNTER — Ambulatory Visit: Payer: Self-pay | Admitting: Pulmonary Disease

## 2024-09-01 DIAGNOSIS — J159 Unspecified bacterial pneumonia: Secondary | ICD-10-CM

## 2024-09-02 ENCOUNTER — Other Ambulatory Visit (HOSPITAL_COMMUNITY)
Admission: RE | Admit: 2024-09-02 | Discharge: 2024-09-02 | Disposition: A | Discharge status: Home / Self Care | Source: Ambulatory Visit | Attending: Pulmonary Disease | Admitting: Pulmonary Disease

## 2024-09-02 DIAGNOSIS — J159 Unspecified bacterial pneumonia: Secondary | ICD-10-CM | POA: Insufficient documentation

## 2024-09-02 LAB — CBC WITH DIFFERENTIAL/PLATELET
Abs Immature Granulocytes: 0.04 K/uL (ref 0.00–0.07)
Basophils Absolute: 0 K/uL (ref 0.0–0.1)
Basophils Relative: 0 %
Eosinophils Absolute: 0.2 K/uL (ref 0.0–0.5)
Eosinophils Relative: 3 %
HCT: 47.7 % (ref 39.0–52.0)
Hemoglobin: 16 g/dL (ref 13.0–17.0)
Immature Granulocytes: 1 %
Lymphocytes Relative: 19 %
Lymphs Abs: 1.6 K/uL (ref 0.7–4.0)
MCH: 30.3 pg (ref 26.0–34.0)
MCHC: 33.5 g/dL (ref 30.0–36.0)
MCV: 90.3 fL (ref 80.0–100.0)
Monocytes Absolute: 0.8 K/uL (ref 0.1–1.0)
Monocytes Relative: 9 %
Neutro Abs: 5.8 K/uL (ref 1.7–7.7)
Neutrophils Relative %: 68 %
Platelets: 201 K/uL (ref 150–400)
RBC: 5.28 MIL/uL (ref 4.22–5.81)
RDW: 14.6 % (ref 11.5–15.5)
WBC: 8.4 K/uL (ref 4.0–10.5)
nRBC: 0 % (ref 0.0–0.2)

## 2024-09-02 LAB — CULTURE, RESPIRATORY W GRAM STAIN

## 2024-09-05 MED ORDER — AMOXICILLIN-POT CLAVULANATE 875-125 MG PO TABS: 1.0000 | ORAL_TABLET | Freq: Two times a day (BID) | ORAL | 0 refills | Status: AC

## 2024-09-05 NOTE — Addendum Note (Signed)
 Addended by: TAMEA DEDRA CROME on: 09/05/2024 03:04 PM   Modules accepted: Orders

## 2024-09-05 NOTE — Telephone Encounter (Signed)
 Instructions are on another message, lets have him hold off on minocycline  for now and continue the Augmentin .  Augmentin  was sent into his pharmacy.

## 2024-09-06 ENCOUNTER — Ambulatory Visit (HOSPITAL_COMMUNITY)
Admission: RE | Admit: 2024-09-06 | Discharge: 2024-09-06 | Disposition: A | Discharge status: Home / Self Care | Source: Ambulatory Visit | Attending: Physician Assistant | Admitting: Physician Assistant

## 2024-09-06 VITALS — BP 116/60 | HR 60 | Ht 69.0 in | Wt 213.0 lb

## 2024-09-06 DIAGNOSIS — Z79899 Other long term (current) drug therapy: Secondary | ICD-10-CM | POA: Diagnosis not present

## 2024-09-06 DIAGNOSIS — Z7952 Long term (current) use of systemic steroids: Secondary | ICD-10-CM | POA: Insufficient documentation

## 2024-09-06 DIAGNOSIS — I4510 Unspecified right bundle-branch block: Secondary | ICD-10-CM | POA: Diagnosis not present

## 2024-09-06 DIAGNOSIS — Z950 Presence of cardiac pacemaker: Secondary | ICD-10-CM | POA: Insufficient documentation

## 2024-09-06 DIAGNOSIS — I2510 Atherosclerotic heart disease of native coronary artery without angina pectoris: Secondary | ICD-10-CM | POA: Diagnosis not present

## 2024-09-06 DIAGNOSIS — Z5181 Encounter for therapeutic drug level monitoring: Secondary | ICD-10-CM | POA: Insufficient documentation

## 2024-09-06 DIAGNOSIS — Z7984 Long term (current) use of oral hypoglycemic drugs: Secondary | ICD-10-CM | POA: Diagnosis not present

## 2024-09-06 DIAGNOSIS — I5032 Chronic diastolic (congestive) heart failure: Secondary | ICD-10-CM | POA: Insufficient documentation

## 2024-09-06 DIAGNOSIS — Z7901 Long term (current) use of anticoagulants: Secondary | ICD-10-CM | POA: Diagnosis not present

## 2024-09-06 DIAGNOSIS — I4819 Other persistent atrial fibrillation: Secondary | ICD-10-CM | POA: Diagnosis present

## 2024-09-06 DIAGNOSIS — I495 Sick sinus syndrome: Secondary | ICD-10-CM | POA: Diagnosis not present

## 2024-09-06 DIAGNOSIS — Z79890 Hormone replacement therapy (postmenopausal): Secondary | ICD-10-CM | POA: Insufficient documentation

## 2024-09-06 DIAGNOSIS — I4891 Unspecified atrial fibrillation: Secondary | ICD-10-CM

## 2024-09-06 DIAGNOSIS — I110 Hypertensive heart disease with heart failure: Secondary | ICD-10-CM | POA: Insufficient documentation

## 2024-09-06 DIAGNOSIS — D6869 Other thrombophilia: Secondary | ICD-10-CM | POA: Diagnosis not present

## 2024-09-06 DIAGNOSIS — I480 Paroxysmal atrial fibrillation: Secondary | ICD-10-CM | POA: Diagnosis not present

## 2024-09-06 NOTE — Progress Notes (Signed)
 Primary Care Physician: Sheryle Carwin, MD Primary Electrophysiologist: Dr Waddell Referring Physician: Dr Kelsie [facility]: Dr Rolan [name] is a 78 y.o. male with a history of SSS s/p PPM, atrial fibrillation, chronic diastolic CHF, HTN, COPD, DM, and GERD who presents for follow up in the [facility]. Patient was recently admitted with elevated temp and AMS and was diagnosed with severe sepsis 2/2 pneumonia. During his admission, he developed afib with RVR. He was given a bolus of flecainide  which converted him to atrial flutter. Patient was fairly asymptomatic. He is on Xarelto  for a CHADS2VASC score of 3.  The device clinic received an alert for an ongoing afib episode starting 04/09/21. He did have two alcohol  drinks the night before and was also being treated for an URI. COVID test was negative. He was back in SR before follow up.  Patient was noted to be out of rhythm by the device clinic on 08/23/21 and underwent DCCV on 09/05/21. He had recurrent afib and underwent another DCCV on 08/06/22. The device clinic received an alert for an ongoing afib episode starting 12/07/22.   Patient is s/p dofetilide  loading 1/16-1/19/24. He converted with the medication and did not require DCCV.   Patient returns for follow up for atrial fibrillation and dofetilide  monitoring. He remains in SR. His PPM shows 1.7% afib burden. His primary complaint is his lung function, he has recently been on several antibiotics and steroids. No bleeding issues on anticoagulation.   Today, he  denies symptoms of palpitations, chest pain, orthopnea, PND, lower extremity edema, dizziness, presyncope, syncope, bleeding, or neurologic sequela. The patient is tolerating medications without difficulties and is otherwise without complaint today.    Atrial Fibrillation Risk Factors:  he does not have symptoms or diagnosis of sleep apnea. he does not have a history of rheumatic fever. he does not  have a history of alcohol  use. The patient does not have a history of early familial atrial fibrillation or other arrhythmias.   Atrial Fibrillation Management history:  Previous antiarrhythmic drugs: flecainide , dofetilide   Previous cardioversions: 11/2016, 09/05/21 Previous ablations: none Anticoagulation history: Xarelto    Past Medical History:  Diagnosis Date   Allergic rhinitis    Aortic valve disorder    Asthma    since childhood- seasonal allergies induced   Cancer (HCC)    Skin cancer- squamous, basal   Carotid artery stenosis    Essential hypertension    Full dentures    GERD (gastroesophageal reflux disease)    H/O hiatal hernia    Hemorrhage of rectum    Hyperlipidemia    Hypothyroidism    Male circumcision    OSA (obstructive sleep apnea)    Osteoarthritis    Pacemaker    Oct 2005 in [place].   PAF (paroxysmal atrial fibrillation) (HCC)    Pneumonia    several Times 2015 last time   Primary localized osteoarthritis of right knee 08/11/2017   RBBB (right bundle branch block)    Sinoatrial node dysfunction (HCC)    Syncope    Tricuspid valve disorder    Type 2 diabetes mellitus (HCC)    Type II    Current Outpatient Medications  Medication Sig Dispense Refill   acetaminophen  (TYLENOL ) 500 MG tablet Take 1,000 mg by mouth 3 (three) times daily.     albuterol  (PROVENTIL ) (2.5 MG/3ML) 0.083% nebulizer solution INHALE ONE VIAL VIA NEBULIZER EVERY 6 HOURS AS NEEDED FOR WHEEZING OR SHORTNESS OF BREATH 90  mL 6   albuterol  (VENTOLIN  HFA) 108 (90 Base) MCG/ACT inhaler INHALE 2 PUFFS INTO THE LUNGS EVERY 4 HOURS AS NEEDED, ONLY IF YOU CAN'T CATCH YOUR BREATH 18 g 5   amoxicillin -clavulanate (AUGMENTIN ) 875-125 MG tablet Take 1 tablet by mouth 2 (two) times daily for 7 days. 14 tablet 0   atorvastatin  (LIPITOR) 80 MG tablet Take 1 tablet (80 mg total) by mouth daily. 30 tablet 11   bisoprolol  (ZEBETA ) 5 MG tablet TAKE 1 AND 1/2 TABLET BY MOUTH TWICE DAILY. 90 tablet 3    BREZTRI  AEROSPHERE 160-9-4.8 MCG/ACT AERO INHALE 2 PUFFS INTO THE LUNGS TWICE DAILY. 10.7 g 5   Carboxymethylcellul-Glycerin (LUBRICATING EYE DROPS OP) Place 1 drop into the right eye 2 (two) times a week. Clear eyes as needed (Patient taking differently: Place 1 drop into the right eye once a week. Clear eyes as needed)     diltiazem  (CARDIZEM  CD) 120 MG 24 hr capsule TAKE (1) CAPSULE BY MOUTH DAILY, MAY TAKE A EXTRA CAPSULE DAILY FOR BREAKTHROUGH AFIB. 180 capsule 3   dofetilide  (TIKOSYN ) 250 MCG capsule TAKE ONE CAPSULE BY MOUTH TWICE DAILY 60 capsule 3   Dupilumab  (DUPIXENT ) 300 MG/2ML SOAJ Inject 300 mg into the skin every 14 (fourteen) days. 12 mL 1   Ensifentrine  (OHTUVAYRE ) 3 MG/2.5ML SUSP Take 3 mg by nebulization 2 (two) times daily. 150 mL 5   EPINEPHrine  0.3 mg/0.3 mL IJ SOAJ injection Inject 0.3 mg into the muscle as needed for anaphylaxis. 1 each 5   ezetimibe  (ZETIA ) 10 MG tablet Take 1 tablet (10 mg total) by mouth daily. 90 tablet 3   fluticasone  (FLONASE ) 50 MCG/ACT nasal spray Place 2 sprays into both nostrils daily. 16 g 6   furosemide  (LASIX ) 40 MG tablet Take 1.5 tablets (60 mg total) by mouth 2 (two) times daily. 90 tablet 3   glipiZIDE  (GLUCOTROL  XL) 5 MG 24 hr tablet Take 5 mg by mouth daily before breakfast.     JARDIANCE  25 MG TABS tablet Take 25 mg by mouth daily before breakfast.     levothyroxine  (SYNTHROID ) 150 MCG tablet Take 150 mcg by mouth daily before breakfast.     losartan  (COZAAR ) 50 MG tablet Take 50 mg by mouth at bedtime.     metFORMIN  (GLUCOPHAGE -XR) 500 MG 24 hr tablet Take 500 mg by mouth in the morning and at bedtime.     Multiple Vitamin (MULTIVITAMIN) tablet Take 1 tablet by mouth daily.     omeprazole  (PRILOSEC  OTC) 20 MG tablet Take 20 mg by mouth every morning.     predniSONE  (DELTASONE ) 10 MG tablet Take 1 tablet (10 mg total) by mouth daily with breakfast. 30 tablet 2   PRESCRIPTION MEDICATION 2 (two) times daily. Thumper vest (Patient taking  differently: 2 (two) times daily. Thumper vest- sometimes three times daily)     Respiratory Therapy Supplies (FLUTTER) DEVI Use as directed 1 each 0   rivaroxaban  (XARELTO ) 20 MG TABS tablet Take 20 mg by mouth at bedtime.     sodium chloride  (OCEAN) 0.65 % SOLN nasal spray Place 1 spray into both nostrils as needed for congestion.     spironolactone  (ALDACTONE ) 25 MG tablet Take 0.5 tablets (12.5 mg total) by mouth daily. 45 tablet 3   No current facility-administered medications for this encounter.    ROS- All systems are reviewed and negative except as per the HPI above.  Physical Exam: Vitals:   09/06/24 1053  BP: 116/60  Pulse: 60  Weight: 96.6 kg  Height: 5' 9 (1.753 m)     GEN: Well nourished, well developed in no acute distress NECK: No JVD; No carotid bruits CARDIAC: Regular rate and rhythm, no murmurs, rubs, gallops RESPIRATORY:  coarse breath sounds bilaterally  ABDOMEN: Soft, non-tender, non-distended EXTREMITIES:  No edema; No deformity    Wt Readings from Last 3 Encounters:  09/06/24 96.6 kg  08/31/24 95.3 kg  07/11/24 99.3 kg    EKG today demonstrates A paced rhythm, RBBB Vent. rate 60 BPM PR interval 204 ms QRS duration 174 ms QT/QTcB 502/502 ms   Echo 06/10/24 demonstrated   1. Left ventricular ejection fraction, by estimation, is 60 to 65%. The  left ventricle has normal function. The left ventricle has no regional  wall motion abnormalities. Left ventricular diastolic parameters were  normal.   2. Right ventricular systolic function is normal. The right ventricular  size is severely enlarged.   3. Right atrial size was severely dilated.   4. There is no evidence of cardiac tamponade.   5. The mitral valve is degenerative. Mild mitral valve regurgitation. No  evidence of mitral stenosis.   6. Tricuspid valve regurgitation is moderate.   7. The aortic valve is calcified. Unable to determine aortic valve  morphology due to image quality. Aortic  valve regurgitation is not  visualized. Calcified leaflets, reduced leaflet excursion, mild to  moderate aortic stenosis based on DI (0.33) and AVA  (VTI) 1.15cm2. Peak velocity 2.22m/s and MG 10 mmHG.   8. The inferior vena cava is dilated in size with <50% respiratory  variability, suggesting right atrial pressure of 15 mmHg.   9. Ascending aorta measurements are within normal limits for age when  indexed to body surface area.   Comparison(s): A prior study was performed on 04/07/2024. LVEF remains  stable, RV function has improved, but RV size remains dilated.    Epic records are reviewed at length today   CHA2DS2-VASc Score = 6  The patient's score is based upon: CHF History: 1 HTN History: 1 Diabetes History: 1 Stroke History: 0 Vascular Disease History: 1 Age Score: 2 Gender Score: 0       ASSESSMENT AND PLAN: Persistent Atrial Fibrillation (ICD10:  I48.19) The patient's CHA2DS2-VASc score is 6, indicating a 9.7% annual risk of stroke.   Last device interrogation showed 1.7% afib burden Continue dofetilide  250 mcg BID Continue bisoprolol  7.5 mg daily Continue diltiazem  120 mg daily Continue Xarelto  20 mg daily  Secondary Hypercoagulable State (ICD10:  D68.69) The patient is at significant risk for stroke/thromboembolism based upon his CHA2DS2-VASc Score of 6.  Continue Rivaroxaban  (Xarelto ). No bleeding issues.   High Risk Medication Monitoring (ICD 10: [id number]) Patient requires ongoing monitoring for anti-arrhythmic medication which has the potential to cause life threatening arrhythmias. QT interval on ECG acceptable for dofetilide  monitoring. Check bmet/mag today.     SSS S/p PPM, followed by Dr Waddell  HTN Stable on current regimen  CAD No anginal symptoms Followed by Dr Rolan  Chronic HFpEF EF 60-65% GDMT per [facility] team Fluid status appears stable today   Follow up with Dr Waddell or EP APP in 3 months. AF clinic 6 months after that visit.     Daril Kicks PA-C [facility] [address] [place], [place] [address] [phone] 09/06/2024 11:31 AM

## 2024-09-07 ENCOUNTER — Ambulatory Visit (HOSPITAL_COMMUNITY): Payer: Self-pay | Admitting: Physician Assistant

## 2024-09-07 LAB — BASIC METABOLIC PANEL WITH GFR
BUN/Creatinine Ratio: 25 — ABNORMAL HIGH (ref 10–24)
BUN: 29 mg/dL — ABNORMAL HIGH (ref 8–27)
CO2: 23 mmol/L (ref 20–29)
Calcium: 9.3 mg/dL (ref 8.6–10.2)
Chloride: 96 mmol/L (ref 96–106)
Creatinine, Ser: 1.18 mg/dL (ref 0.76–1.27)
Glucose: 165 mg/dL — ABNORMAL HIGH (ref 70–99)
Potassium: 4.4 mmol/L (ref 3.5–5.2)
Sodium: 137 mmol/L (ref 134–144)
eGFR: 63 mL/min/1.73 (ref >59)

## 2024-09-07 LAB — MAGNESIUM: Magnesium: 2.1 mg/dL (ref 1.6–2.3)

## 2024-09-08 ENCOUNTER — Ambulatory Visit: Payer: Self-pay | Admitting: Pulmonary Disease

## 2024-09-08 LAB — IMMUNOGLOBULINS A/E/G/M, SERUM
IgA: 145 mg/dL (ref 61–437)
IgE (Immunoglobulin E), Serum: 13 [IU]/mL (ref 6–495)
IgG (Immunoglobin G), Serum: 872 mg/dL (ref 603–1613)
IgM (Immunoglobulin M), Srm: 109 mg/dL (ref 15–143)

## 2024-09-12 ENCOUNTER — Other Ambulatory Visit: Payer: Self-pay

## 2024-09-12 NOTE — Progress Notes (Signed)
 Specialty Pharmacy Refill Coordination Note  [name] is a 78 y.o. male contacted today regarding refills of specialty medication(s) Dupilumab  (Dupixent )   Patient requested Marylyn at [facility] at [place] date: 09/14/24   Medication will be filled on 09/13/24.

## 2024-09-13 ENCOUNTER — Other Ambulatory Visit: Payer: Self-pay

## 2024-09-15 ENCOUNTER — Other Ambulatory Visit: Payer: Self-pay

## 2024-09-21 NOTE — Progress Notes (Signed)
 Remote PPM Transmission

## 2024-09-22 ENCOUNTER — Encounter: Payer: Self-pay | Admitting: Pulmonary Disease

## 2024-09-22 NOTE — Telephone Encounter (Signed)
 He can try taking the prednisone  every other day for 1 week and then stop.  I do not think that this is really what is driving it since it is a very low dose but worth a try.

## 2024-09-26 ENCOUNTER — Other Ambulatory Visit: Payer: Self-pay | Admitting: Pulmonary Disease

## 2024-09-27 ENCOUNTER — Ambulatory Visit: Admitting: Pulmonary Disease

## 2024-09-27 ENCOUNTER — Telehealth: Payer: Self-pay

## 2024-09-27 ENCOUNTER — Encounter: Payer: Self-pay | Admitting: Pulmonary Disease

## 2024-09-27 VITALS — BP 106/60 | HR 62 | Temp 97.6°F | Ht 69.0 in | Wt 211.6 lb

## 2024-09-27 DIAGNOSIS — J479 Bronchiectasis, uncomplicated: Secondary | ICD-10-CM

## 2024-09-27 DIAGNOSIS — J9611 Chronic respiratory failure with hypoxia: Secondary | ICD-10-CM

## 2024-09-27 DIAGNOSIS — I4819 Other persistent atrial fibrillation: Secondary | ICD-10-CM

## 2024-09-27 DIAGNOSIS — J4489 Other specified chronic obstructive pulmonary disease: Secondary | ICD-10-CM | POA: Diagnosis not present

## 2024-09-27 DIAGNOSIS — R042 Hemoptysis: Secondary | ICD-10-CM

## 2024-09-27 DIAGNOSIS — I272 Pulmonary hypertension, unspecified: Secondary | ICD-10-CM | POA: Diagnosis not present

## 2024-09-27 DIAGNOSIS — G4733 Obstructive sleep apnea (adult) (pediatric): Secondary | ICD-10-CM

## 2024-09-27 NOTE — Telephone Encounter (Signed)
 Patient seen in office today by Dr. Tamea. Patient will be started on Brinsupri. Patient signed forms. Will be faxed to pharmacy team.

## 2024-09-27 NOTE — Patient Instructions (Addendum)
 Continue your respiratory medications as they are currently.  We are going to add a medication called Brinsupri (brensocatib), 25 mg, 1 tablet daily.  Our pharmacy team will process the request.  We are also ordering a CT scan of the chest this will be done at [facility].  We will see you in follow-up in 3 to 4 weeks time.  Call sooner should new problems arise.  VISIT SUMMARY:  Today, you came in for a follow-up visit to discuss your respiratory conditions and back pain. We reviewed your symptoms, including shortness of breath, mucus production, and back pain, and discussed your current medications and their effectiveness.  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 Severe persistent asthma is a long-term condition where the airways become inflamed and narrowed. You experience cycles of respiratory symptoms, including red sputum production, which you associate with Dupixent  injections. You report frequent shortness of breath and low oxygen  levels at rest, which improve with supplemental oxygen . We discussed the possibility of switching to nebulizers to see if they might be more effective for you. We also noted your frequent episodes of atrial fibrillation, which could complicate your respiratory condition. We will order a CT scan of your chest to evaluate your current lung status.  INSTRUCTIONS:  Please schedule a CT scan of your chest as soon as possible to help us  evaluate your current lung status. Continue using your supplemental oxygen  as needed and monitor your symptoms. If you experience any worsening of your condition or new symptoms, please contact our office immediately.

## 2024-09-27 NOTE — Progress Notes (Signed)
 Subjective:    Patient ID: Clarence Dawson, male    DOB: 1946-09-15, 78 y.o.   MRN: [id number]  Patient Care Team: Sheryle Carwin, MD as PCP - General (Internal Medicine) Waddell Danelle ORN, MD as PCP - Cardiology (Cardiology) Waddell Danelle ORN, MD as PCP - Electrophysiology (Cardiology) Tamea Dedra CROME, MD as Consulting Physician (Pulmonary Disease)  Chief Complaint  Patient presents with   COPD    Cough and shortness of breath.     BACKGROUND/INTERVAL:Clarence Dawson is a very complex 78 year old former smoker (30 PY) who presents for follow-up from his most recent visit of 31 August 2024. 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  Prior issues with ABPA and Aspergillus colonization.  He has also been diagnosed with moderate pulmonary arterial hypertension (group 3), moderate nonobstructive coronary artery disease.  He also has a history of atrial fibrillation on Xarelto  and Tikosyn .  He has very complex issues as noted.  He completed pulmonary rehab.  He uses chest vest physiotherapy for management of his bronchiectasis.  COPD/asthma management is maximized. On the 17 December 2023 visit he was placed on minocycline  for stenotrophomonas infection (on Tikosyn  and cannot have QT prolonging medications such as Levaquin  and sulfa  medications increase Tikosyn  level).  He was last seen on 09 March 2024 and at that time required a second round of minocycline .  Sputum specimen since that second therapy has shown no stenotrophomonas.  He has intermittent hemoptysis of longstanding.  Also issues with recurrent left lower lobe collapse usually related to mucous plugging.  HPI Discussed the use of AI scribe software for clinical note transcription with the patient, who gave verbal consent to proceed.  History of Present Illness   Clarence Dawson is a 78 year old male with COPD, bronchiectasis, and severe  persistent asthma who presents for follow-up of his respiratory conditions and back pain.  He experiences frequent shortness of breath, even while at rest, and uses supplemental oxygen , adjusting it to a level of five to maintain oxygen  saturation around 90%. His oxygen  levels can drop to 84% when inactive, but improve after using oxygen  for about ten minutes. He describes himself as a 'panter or a shallow breather.'  He reports a cyclical pattern of respiratory symptoms, including wheezing and production of red, thick sputum that transitions to brown or green. This cycle seems to correlate with his use of Dupixent , which he associates with periods of increased mucus production followed by clearer breathing. He is currently using Ohtuvayre  and has recently ordered a new batch.  He describes his back pain as being caused by 'the wings on the lumbars' touching each other and reports that scans have shown this. He has been prescribed Oxycontin  5 mg to help with sleep, but he is reluctant to take it. He took one dose last night and was able to sleep until 5 AM, but the pain returned upon waking. The pain is mostly on the right side at the belt line and radiates down the leg, exacerbated by twisting or sitting the wrong way.  He mentions experiencing frequent episodes of atrial fibrillation over the past three weeks, which he monitors at home. He is unsure which cardiologist to report these episodes to, as his long-time cardiologist, Dr. Waddell, is retiring soon. He has been under Dr. Adrian care for his pacemaker management for over a decade.       Review of Systems A 10 point  review of systems was performed and it is as noted above otherwise negative.   Patient Active Problem List   Diagnosis Date Noted   Lactic acidosis 04/07/2024   Elevated brain natriuretic peptide (BNP) level 04/07/2024   COPD (chronic obstructive pulmonary disease) (HCC) 04/07/2024   Elevated troponin 04/07/2024   Prolonged  QT interval 04/07/2024   Atrial fibrillation, chronic (HCC) 04/07/2024   Acquired hypothyroidism 04/07/2024   Encounter for monitoring dofetilide  therapy 02/25/2024   Chronic respiratory failure with hypoxia (HCC) 06/12/2023   Hypercoagulable state due to persistent atrial fibrillation (HCC) 01/02/2020   Chronic diastolic heart failure (HCC) 11/22/2019   Paroxysmal atrial fibrillation (HCC) 09/22/2019   Atrial fibrillation with RVR (HCC)    Lobar pneumonia 09/21/2019   Acute respiratory failure with hypoxia (HCC) 09/21/2019   Acute on chronic respiratory failure with hypoxia (HCC) 09/18/2019   Elevated lactic acid level    GERD (gastroesophageal reflux disease)    Chronic bronchitis (HCC) 02/09/2019   Sepsis due to pneumonia (HCC) 03/03/2018   Primary localized osteoarthritis of right knee 08/11/2017   Asthma with acute exacerbation 12/08/2016   Chest pain 10/20/2016   Essential hypertension 10/20/2016   Morbid obesity due to excess calories (HCC) 06/24/2016   Severe persistent asthma 02/12/2016   Upper airway cough syndrome 01/24/2016   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   Persistent atrial fibrillation    Syncope    Hyperthyroidism    Hyperlipidemia    RBBB (right bundle branch block)    Tricuspid valve disorder    Aortic valve disorder    Carotid artery stenosis    Type 2 diabetes mellitus (HCC)    Pacemaker    OSA on CPAP    Arthritis    Sinoatrial node dysfunction (HCC)    PVC's (premature ventricular contractions) 07/17/2011   Essential hypertension, benign 01/24/2011   Premature ventricular contractions 01/24/2011   PPM-[facility] 01/24/2011    Social History   Tobacco Use   Smoking status: Former    Current packs/day: 0.00    Average packs/day: 3.0 packs/day for 26.0 years (77.9 ttl pk-yrs)    Types: Cigarettes    Start date: 02/20/1958    Quit date: 02/12/1984    Years since quitting: 40.6   Smokeless tobacco: Former    Types: Chew   Tobacco comments:    Former  smoker 08/30/2021  Substance Use Topics   Alcohol  use: Yes    Alcohol /week: 2.0 - 4.0 standard drinks of alcohol     Types: 1 - 2 Glasses of wine, 1 - 2 Cans of beer per week    Comment: 1 glass of wine or beer 3 times a week 12/26/22    Allergies  Allergen Reactions   Food Anaphylaxis and Shortness Of Breath    TREE NUTS   Iodinated Contrast Media Hives and Shortness Of Breath    Patient states hives to throat closing. (01/15/17: patient states this reaction was about 20 years ago with possibly an IVP.  He now says high doses of prednisone  throw me into AFib.  He has tolerated CT arthrograms with Benadrly 50mg  PO one hour before injection.  Rudolph Loss, RN)   Shellfish Allergy  Anaphylaxis and Shortness Of Breath    To shellfish, crabs.  Makes him feel like things are crawling all over me.  Denies airway issues with these foods.  Arma Loss, RN 01/15/17)   Goat-Derived Products Hives    GOAT CHEESE    Prednisone  Palpitations    PRECIPITATES A-FIB  Diclofenac Sodium Other (See Comments)    Hives, buggy feeling all over   Metformin  And Related Diarrhea    High doses at once   Vancomycin  Anxiety    Red man syndrome   Voltaren [Diclofenac Sodium] Other (See Comments)    Feels like things are crawling on him    Current Meds  Medication Sig   acetaminophen  (TYLENOL ) 500 MG tablet Take 1,000 mg by mouth 3 (three) times daily.   albuterol  (PROVENTIL ) (2.5 MG/3ML) 0.083% nebulizer solution INHALE ONE VIAL VIA NEBULIZER EVERY 6 HOURS AS NEEDED FOR WHEEZING OR SHORTNESS OF BREATH   albuterol  (VENTOLIN  HFA) 108 (90 Base) MCG/ACT inhaler INHALE 2 PUFFS INTO THE LUNGS EVERY 4 HOURS AS NEEDED, ONLY IF YOU CAN'T CATCH YOUR BREATH   atorvastatin  (LIPITOR) 80 MG tablet Take 1 tablet (80 mg total) by mouth daily.   bisoprolol  (ZEBETA ) 5 MG tablet TAKE 1 AND 1/2 TABLET BY MOUTH TWICE DAILY.   BREZTRI  AEROSPHERE 160-9-4.8 MCG/ACT AERO inhaler INHALE 2 PUFFS INTO THE LUNGS TWICE DAILY.    Carboxymethylcellul-Glycerin (LUBRICATING EYE DROPS OP) Place 1 drop into the right eye 2 (two) times a week. Clear eyes as needed (Patient taking differently: Place 1 drop into the right eye once a week. Clear eyes as needed)   diltiazem  (CARDIZEM  CD) 120 MG 24 hr capsule TAKE (1) CAPSULE BY MOUTH DAILY, MAY TAKE A EXTRA CAPSULE DAILY FOR BREAKTHROUGH AFIB.   dofetilide  (TIKOSYN ) 250 MCG capsule TAKE ONE CAPSULE BY MOUTH TWICE DAILY   Dupilumab  (DUPIXENT ) 300 MG/2ML SOAJ Inject 300 mg into the skin every 14 (fourteen) days.   Ensifentrine  (OHTUVAYRE ) 3 MG/2.5ML SUSP Take 3 mg by nebulization 2 (two) times daily.   EPINEPHrine  0.3 mg/0.3 mL IJ SOAJ injection Inject 0.3 mg into the muscle as needed for anaphylaxis.   ezetimibe  (ZETIA ) 10 MG tablet Take 1 tablet (10 mg total) by mouth daily.   fluticasone  (FLONASE ) 50 MCG/ACT nasal spray Place 2 sprays into both nostrils daily.   furosemide  (LASIX ) 40 MG tablet Take 1.5 tablets (60 mg total) by mouth 2 (two) times daily.   glipiZIDE  (GLUCOTROL  XL) 5 MG 24 hr tablet Take 5 mg by mouth daily before breakfast.   HYDROcodone -acetaminophen  (NORCO/VICODIN) 5-325 MG tablet Take 1 tablet by mouth every 6 (six) hours as needed.   JARDIANCE  25 MG TABS tablet Take 25 mg by mouth daily before breakfast.   LANTUS  100 UNIT/ML injection Inject 10 Units into the skin every morning.   levothyroxine  (SYNTHROID ) 150 MCG tablet Take 150 mcg by mouth daily before breakfast.   losartan  (COZAAR ) 50 MG tablet Take 50 mg by mouth at bedtime.   metFORMIN  (GLUCOPHAGE -XR) 500 MG 24 hr tablet Take 500 mg by mouth in the morning and at bedtime.   Multiple Vitamin (MULTIVITAMIN) tablet Take 1 tablet by mouth daily.   omeprazole  (PRILOSEC  OTC) 20 MG tablet Take 20 mg by mouth every morning.   predniSONE  (DELTASONE ) 10 MG tablet Take 1 tablet (10 mg total) by mouth daily with breakfast.   PRESCRIPTION MEDICATION 2 (two) times daily. Thumper vest (Patient taking differently: 2 (two)  times daily. Thumper vest- sometimes three times daily)   Respiratory Therapy Supplies (FLUTTER) DEVI Use as directed   rivaroxaban  (XARELTO ) 20 MG TABS tablet Take 20 mg by mouth at bedtime.   sodium chloride  (OCEAN) 0.65 % SOLN nasal spray Place 1 spray into both nostrils as needed for congestion.   spironolactone  (ALDACTONE ) 25 MG tablet Take 0.5 tablets (12.5  mg total) by mouth daily.    Immunization History  Administered Date(s) Administered   Fluad Quad(high Dose 65+) 09/12/2019, 10/15/2022   INFLUENZA, HIGH DOSE SEASONAL PF 09/28/2017, 09/20/2024   Influenza Split 11/14/2015, 10/11/2018   Influenza-Unspecified 11/02/2016, 09/03/2020, 10/03/2021, 10/23/2023   Moderna SARS-COV2 Booster Vaccination 08/20/2020, 03/04/2021   Moderna Sars-Covid-2 Vaccination 02/15/2020, 03/14/2020   Pneumococcal Conjugate-13 09/28/2015   Tdap 11/28/2019        Objective:     BP 106/60   Pulse 62   Temp 97.6 F (36.4 C) (Temporal)   Ht 5' 9 (1.753 m)   Wt 211 lb 9.6 oz (96 kg)   SpO2 93% Comment: 5L (POC)  BMI 31.25 kg/m   SpO2: 93 % (5L (POC))  GENERAL: Well-developed, obese patient in no acute distress.  Mildly tachypneic on nasal cannula O2.  He is fully ambulatory. HEAD: Normocephalic, atraumatic.  EYES: Pupils equal, round, reactive to light.  No scleral icterus.  MOUTH:   Upper and lower dentures.  Oral mucosa moist.  No thrush. NECK: Supple. No thyromegaly. Trachea midline. No JVD.  No adenopathy. PULMONARY: Good air entry bilaterally air entry symmetrical throughout.  Diffuse rhonchi throughout. No wheezes throughout.  CARDIOVASCULAR: S1 and S2.  Regular rate and rhythm,rate 60.  Grade 2/6 mitral regurgitation murmur, grade 3/6 aortic stenosis murmur. Pacemaker on left. GASTROINTESTINAL: Obese, otherwise benign. MUSCULOSKELETAL: No joint deformity, no clubbing, no edema.  NEUROLOGIC: No focal deficits, speech is fluent.  No gait disturbance noted.  Awake, alert and  oriented. SKIN: Intact,warm,dry. PSYCH: Mood and behavior appropriate.        Assessment & Plan:     ICD-10-CM   1. Asthma-COPD overlap syndrome (HCC)  J44.89 CT CHEST WO CONTRAST    2. Bronchiectasis without complication (HCC)  J47.9 CT CHEST WO CONTRAST    3. Chronic respiratory failure with hypoxia (HCC)  J96.11     4. Pulmonary HTN (HCC)  I27.20     5. Persistent atrial fibrillation  I48.19     6. OSA (obstructive sleep apnea)  G47.33     7. Cough with hemoptysis  R04.2 CT CHEST WO CONTRAST      Orders Placed This Encounter  Procedures   CT CHEST WO CONTRAST    Standing Status:   Future    Expected Date:   10/04/2024    Expiration Date:   09/27/2025    Preferred imaging location?:   [facility]   Discussion:    Chronic obstructive pulmonary disease (COPD) with bronchiectasis and severe persistent asthma He presents with a complex case of COPD, bronchiectasis, and severe persistent asthma. He experiences cycles of respiratory symptoms, including red sputum production, which he associates with Dupixent  injections. He reports frequent dyspnea and low oxygen  saturation at rest, which improves with supplemental oxygen . He is currently using Ohtuvayre  and Dupixent , but the effectiveness of these medications is uncertain. There is consideration to switch to nebulizers. He also reports frequent atrial fibrillation, which may complicate his respiratory condition. - Order CT chest to evaluate current lung status  - Will give trial of Binsupri 25 mg daily, request sent through pharmacy team  Will see the patient in follow-up in 3 to 4 weeks time call sooner should any problems arise.   Advised if symptoms do not improve or worsen, to please contact office for sooner follow up or seek emergency care.    I spent 40 minutes of dedicated to the care of this patient on the date of this  encounter to include pre-visit review of records, face-to-face time with the patient  discussing conditions above, post visit ordering of testing, clinical documentation with the electronic health record, making appropriate referrals as documented, and communicating necessary findings to members of the patients care team.     C. Leita Sanders, MD Advanced Bronchoscopy PCCM [facility]    *This note was generated using voice recognition software/Dragon and/or AI transcription program.  Despite best efforts to proofread, errors can occur which can change the meaning. Any transcriptional errors that result from this process are unintentional and may not be fully corrected at the time of dictation.

## 2024-09-27 NOTE — Telephone Encounter (Signed)
 Submitted a Prior Authorization request to [employer] for BRINSUPRI via CoverMyMeds. Will update once we receive a response.  Key: [id number]

## 2024-09-29 ENCOUNTER — Other Ambulatory Visit (HOSPITAL_COMMUNITY): Payer: Self-pay

## 2024-09-29 NOTE — Telephone Encounter (Signed)
 See new encounter. NFN.   Jenny Monette BIRCH, [facility] 09/29/24  2:08 PM Note Received notification from [facility], [place] regarding a prior authorization for BRINSUPRI. Authorization has been APPROVED from 09/29/24 to 12/28/24. Approval letter sent to scan center.

## 2024-09-29 NOTE — Telephone Encounter (Signed)
 Received notification from [facility] regarding a prior authorization for BRINSUPRI. Authorization has been APPROVED from 09/29/24 to 12/28/24. Approval letter sent to scan center.  Per test claim, copay for 30 days supply is $0  Authorization # [id number] Phone # [phone]

## 2024-09-29 NOTE — Telephone Encounter (Signed)
 Received PA form for Brinsupri from [employer]. Completed and faxed  Fax: [phone] Phone: [phone] Case # [id number]  Sherry Pennant, PharmD, MPH, BCPS, CPP Clinical Pharmacist [facility])

## 2024-09-30 ENCOUNTER — Other Ambulatory Visit: Payer: Self-pay

## 2024-09-30 ENCOUNTER — Emergency Department (HOSPITAL_COMMUNITY)

## 2024-09-30 ENCOUNTER — Encounter (HOSPITAL_COMMUNITY): Payer: Self-pay | Admitting: Emergency Medicine

## 2024-09-30 ENCOUNTER — Inpatient Hospital Stay (HOSPITAL_COMMUNITY)
Admission: EM | Admit: 2024-09-30 | Discharge: 2024-10-04 | DRG: 871 | Disposition: A | Discharge status: Home / Self Care | Attending: Internal Medicine | Admitting: Internal Medicine

## 2024-09-30 DIAGNOSIS — E875 Hyperkalemia: Secondary | ICD-10-CM | POA: Diagnosis present

## 2024-09-30 DIAGNOSIS — Z800 Family history of malignant neoplasm of digestive organs: Secondary | ICD-10-CM

## 2024-09-30 DIAGNOSIS — Z7984 Long term (current) use of oral hypoglycemic drugs: Secondary | ICD-10-CM

## 2024-09-30 DIAGNOSIS — Z96651 Presence of right artificial knee joint: Secondary | ICD-10-CM | POA: Diagnosis present

## 2024-09-30 DIAGNOSIS — I110 Hypertensive heart disease with heart failure: Secondary | ICD-10-CM | POA: Diagnosis present

## 2024-09-30 DIAGNOSIS — J9621 Acute and chronic respiratory failure with hypoxia: Secondary | ICD-10-CM | POA: Diagnosis present

## 2024-09-30 DIAGNOSIS — A419 Sepsis, unspecified organism: Secondary | ICD-10-CM | POA: Diagnosis present

## 2024-09-30 DIAGNOSIS — J441 Chronic obstructive pulmonary disease with (acute) exacerbation: Secondary | ICD-10-CM | POA: Diagnosis present

## 2024-09-30 DIAGNOSIS — Z87891 Personal history of nicotine dependence: Secondary | ICD-10-CM

## 2024-09-30 DIAGNOSIS — E782 Mixed hyperlipidemia: Secondary | ICD-10-CM | POA: Diagnosis present

## 2024-09-30 DIAGNOSIS — E66811 Obesity, class 1: Secondary | ICD-10-CM | POA: Diagnosis present

## 2024-09-30 DIAGNOSIS — R6520 Severe sepsis without septic shock: Secondary | ICD-10-CM | POA: Diagnosis present

## 2024-09-30 DIAGNOSIS — K219 Gastro-esophageal reflux disease without esophagitis: Secondary | ICD-10-CM | POA: Diagnosis present

## 2024-09-30 DIAGNOSIS — Z947 Corneal transplant status: Secondary | ICD-10-CM

## 2024-09-30 DIAGNOSIS — I5033 Acute on chronic diastolic (congestive) heart failure: Secondary | ICD-10-CM | POA: Diagnosis present

## 2024-09-30 DIAGNOSIS — Z91041 Radiographic dye allergy status: Secondary | ICD-10-CM

## 2024-09-30 DIAGNOSIS — Z794 Long term (current) use of insulin: Secondary | ICD-10-CM

## 2024-09-30 DIAGNOSIS — J45901 Unspecified asthma with (acute) exacerbation: Secondary | ICD-10-CM | POA: Diagnosis present

## 2024-09-30 DIAGNOSIS — J439 Emphysema, unspecified: Secondary | ICD-10-CM | POA: Diagnosis present

## 2024-09-30 DIAGNOSIS — Z79890 Hormone replacement therapy (postmenopausal): Secondary | ICD-10-CM

## 2024-09-30 DIAGNOSIS — I4819 Other persistent atrial fibrillation: Secondary | ICD-10-CM | POA: Diagnosis present

## 2024-09-30 DIAGNOSIS — J9601 Acute respiratory failure with hypoxia: Secondary | ICD-10-CM | POA: Diagnosis present

## 2024-09-30 DIAGNOSIS — J189 Pneumonia, unspecified organism: Secondary | ICD-10-CM | POA: Diagnosis not present

## 2024-09-30 DIAGNOSIS — Z9049 Acquired absence of other specified parts of digestive tract: Secondary | ICD-10-CM

## 2024-09-30 DIAGNOSIS — Z1152 Encounter for screening for COVID-19: Secondary | ICD-10-CM | POA: Diagnosis not present

## 2024-09-30 DIAGNOSIS — E872 Acidosis, unspecified: Secondary | ICD-10-CM | POA: Diagnosis not present

## 2024-09-30 DIAGNOSIS — E039 Hypothyroidism, unspecified: Secondary | ICD-10-CM | POA: Diagnosis present

## 2024-09-30 DIAGNOSIS — I272 Pulmonary hypertension, unspecified: Secondary | ICD-10-CM | POA: Diagnosis present

## 2024-09-30 DIAGNOSIS — N179 Acute kidney failure, unspecified: Secondary | ICD-10-CM | POA: Diagnosis present

## 2024-09-30 DIAGNOSIS — J440 Chronic obstructive pulmonary disease with acute lower respiratory infection: Secondary | ICD-10-CM | POA: Diagnosis present

## 2024-09-30 DIAGNOSIS — Z823 Family history of stroke: Secondary | ICD-10-CM

## 2024-09-30 DIAGNOSIS — R7989 Other specified abnormal findings of blood chemistry: Secondary | ICD-10-CM | POA: Diagnosis not present

## 2024-09-30 DIAGNOSIS — Z8249 Family history of ischemic heart disease and other diseases of the circulatory system: Secondary | ICD-10-CM

## 2024-09-30 DIAGNOSIS — J9819 Other pulmonary collapse: Secondary | ICD-10-CM | POA: Diagnosis present

## 2024-09-30 DIAGNOSIS — G4733 Obstructive sleep apnea (adult) (pediatric): Secondary | ICD-10-CM | POA: Diagnosis present

## 2024-09-30 DIAGNOSIS — Z881 Allergy status to other antibiotic agents status: Secondary | ICD-10-CM

## 2024-09-30 DIAGNOSIS — Z6831 Body mass index (BMI) 31.0-31.9, adult: Secondary | ICD-10-CM

## 2024-09-30 DIAGNOSIS — E1165 Type 2 diabetes mellitus with hyperglycemia: Secondary | ICD-10-CM | POA: Diagnosis present

## 2024-09-30 DIAGNOSIS — Z91018 Allergy to other foods: Secondary | ICD-10-CM

## 2024-09-30 DIAGNOSIS — I509 Heart failure, unspecified: Secondary | ICD-10-CM

## 2024-09-30 DIAGNOSIS — Z7901 Long term (current) use of anticoagulants: Secondary | ICD-10-CM

## 2024-09-30 DIAGNOSIS — Z7952 Long term (current) use of systemic steroids: Secondary | ICD-10-CM

## 2024-09-30 DIAGNOSIS — Z85828 Personal history of other malignant neoplasm of skin: Secondary | ICD-10-CM

## 2024-09-30 DIAGNOSIS — Z888 Allergy status to other drugs, medicaments and biological substances status: Secondary | ICD-10-CM

## 2024-09-30 DIAGNOSIS — Z79899 Other long term (current) drug therapy: Secondary | ICD-10-CM

## 2024-09-30 DIAGNOSIS — Z950 Presence of cardiac pacemaker: Secondary | ICD-10-CM

## 2024-09-30 DIAGNOSIS — Z91013 Allergy to seafood: Secondary | ICD-10-CM

## 2024-09-30 DIAGNOSIS — I5031 Acute diastolic (congestive) heart failure: Secondary | ICD-10-CM | POA: Diagnosis not present

## 2024-09-30 DIAGNOSIS — Z833 Family history of diabetes mellitus: Secondary | ICD-10-CM

## 2024-09-30 HISTORY — DX: Chronic obstructive pulmonary disease, unspecified: J44.9

## 2024-09-30 HISTORY — DX: Heart failure, unspecified: I50.9

## 2024-09-30 LAB — CBC WITH DIFFERENTIAL/PLATELET
Abs Immature Granulocytes: 0.06 K/uL (ref 0.00–0.07)
Basophils Absolute: 0.1 K/uL (ref 0.0–0.1)
Basophils Relative: 0 %
Eosinophils Absolute: 0 K/uL (ref 0.0–0.5)
Eosinophils Relative: 0 %
HCT: 48.9 % (ref 39.0–52.0)
Hemoglobin: 15.7 g/dL (ref 13.0–17.0)
Immature Granulocytes: 0 %
Lymphocytes Relative: 14 %
Lymphs Abs: 2.4 K/uL (ref 0.7–4.0)
MCH: 30.1 pg (ref 26.0–34.0)
MCHC: 32.1 g/dL (ref 30.0–36.0)
MCV: 93.9 fL (ref 80.0–100.0)
Monocytes Absolute: 1.2 K/uL — ABNORMAL HIGH (ref 0.1–1.0)
Monocytes Relative: 7 %
Neutro Abs: 13.1 K/uL — ABNORMAL HIGH (ref 1.7–7.7)
Neutrophils Relative %: 79 %
Platelets: 249 K/uL (ref 150–400)
RBC: 5.21 MIL/uL (ref 4.22–5.81)
RDW: 15.4 % (ref 11.5–15.5)
WBC: 16.8 K/uL — ABNORMAL HIGH (ref 4.0–10.5)
nRBC: 0 % (ref 0.0–0.2)

## 2024-09-30 LAB — RESP PANEL BY RT-PCR (RSV, FLU A&B, COVID)  RVPGX2
Influenza A by PCR: NEGATIVE
Influenza B by PCR: NEGATIVE
Resp Syncytial Virus by PCR: NEGATIVE
SARS Coronavirus 2 by RT PCR: NEGATIVE

## 2024-09-30 LAB — I-STAT CHEM 8, ED
BUN: 47 mg/dL — ABNORMAL HIGH (ref 8–23)
Calcium, Ion: 1.06 mmol/L — ABNORMAL LOW (ref 1.15–1.40)
Chloride: 100 mmol/L (ref 98–111)
Creatinine, Ser: 2.1 mg/dL — ABNORMAL HIGH (ref 0.61–1.24)
Glucose, Bld: 286 mg/dL — ABNORMAL HIGH (ref 70–99)
HCT: 45 % (ref 39.0–52.0)
Hemoglobin: 15.3 g/dL (ref 13.0–17.0)
Potassium: 6 mmol/L — ABNORMAL HIGH (ref 3.5–5.1)
Sodium: 135 mmol/L (ref 135–145)
TCO2: 23 mmol/L (ref 22–32)

## 2024-09-30 LAB — BASIC METABOLIC PANEL WITH GFR
Anion gap: 27 — ABNORMAL HIGH (ref 5–15)
BUN: 38 mg/dL — ABNORMAL HIGH (ref 8–23)
CO2: 17 mmol/L — ABNORMAL LOW (ref 22–32)
Calcium: 9.6 mg/dL (ref 8.9–10.3)
Chloride: 96 mmol/L — ABNORMAL LOW (ref 98–111)
Creatinine, Ser: 2.08 mg/dL — ABNORMAL HIGH (ref 0.61–1.24)
GFR, Estimated: 32 mL/min — ABNORMAL LOW (ref >60)
Glucose, Bld: 295 mg/dL — ABNORMAL HIGH (ref 70–99)
Potassium: 5.9 mmol/L — ABNORMAL HIGH (ref 3.5–5.1)
Sodium: 139 mmol/L (ref 135–145)

## 2024-09-30 LAB — BLOOD GAS, VENOUS
Acid-base deficit: 3.4 mmol/L — ABNORMAL HIGH (ref 0.0–2.0)
Bicarbonate: 20.3 mmol/L (ref 20.0–28.0)
Drawn by: 1528
O2 Saturation: 78.9 %
Patient temperature: 36.9
pCO2, Ven: 32 mmHg — ABNORMAL LOW (ref 44–60)
pH, Ven: 7.41 (ref 7.25–7.43)
pO2, Ven: 46 mmHg — ABNORMAL HIGH (ref 32–45)

## 2024-09-30 LAB — LACTIC ACID, PLASMA
Lactic Acid, Venous: 7.6 mmol/L (ref 0.5–1.9)
Lactic Acid, Venous: 5.1 mmol/L (ref 0.5–1.9)

## 2024-09-30 LAB — PRO BRAIN NATRIURETIC PEPTIDE: Pro Brain Natriuretic Peptide: 15139 pg/mL — ABNORMAL HIGH (ref <300.0)

## 2024-09-30 LAB — TROPONIN T, HIGH SENSITIVITY
Troponin T High Sensitivity: 71 ng/L — ABNORMAL HIGH (ref 0–19)
Troponin T High Sensitivity: 70 ng/L — ABNORMAL HIGH (ref 0–19)

## 2024-09-30 MED ORDER — RIVAROXABAN 20 MG PO TABS
20.0000 mg | ORAL_TABLET | Freq: Every day | ORAL | Status: DC
  Administered 2024-09-30: 20 mg via ORAL
  Filled 2024-09-30: qty 1

## 2024-09-30 MED ORDER — IPRATROPIUM BROMIDE 0.02 % IN SOLN
2.0000 mg | Freq: Once | RESPIRATORY_TRACT | Status: AC
  Administered 2024-09-30: 2 mg via RESPIRATORY_TRACT

## 2024-09-30 MED ORDER — PROCHLORPERAZINE EDISYLATE 10 MG/2ML IJ SOLN: 10.0000 mg | Freq: Four times a day (QID) | INTRAMUSCULAR | Status: DC | PRN

## 2024-09-30 MED ORDER — DM-GUAIFENESIN ER 30-600 MG PO TB12
1.0000 | ORAL_TABLET | Freq: Two times a day (BID) | ORAL | Status: DC
  Administered 2024-10-01 – 2024-10-04 (×7): 1 via ORAL
  Filled 2024-09-30 (×7): qty 1

## 2024-09-30 MED ORDER — METHYLPREDNISOLONE SODIUM SUCC 125 MG IJ SOLR
INTRAMUSCULAR | Status: AC
  Administered 2024-09-30: 125 mg via INTRAVENOUS
  Filled 2024-09-30: qty 2

## 2024-09-30 MED ORDER — ACETAMINOPHEN 650 MG RE SUPP: 650.0000 mg | Freq: Four times a day (QID) | RECTAL | Status: DC | PRN

## 2024-09-30 MED ORDER — LEVOTHYROXINE SODIUM 75 MCG PO TABS
150.0000 ug | ORAL_TABLET | Freq: Every day | ORAL | Status: DC
  Administered 2024-10-02 – 2024-10-04 (×3): 150 ug via ORAL
  Filled 2024-09-30 (×3): qty 2

## 2024-09-30 MED ORDER — MAGNESIUM SULFATE 2 GM/50ML IV SOLN
2.0000 g | Freq: Once | INTRAVENOUS | Status: AC
  Administered 2024-09-30: 2 g via INTRAVENOUS
  Filled 2024-09-30: qty 50

## 2024-09-30 MED ORDER — SODIUM ZIRCONIUM CYCLOSILICATE 10 G PO PACK
10.0000 g | PACK | Freq: Once | ORAL | Status: AC
  Administered 2024-09-30: 10 g via ORAL
  Filled 2024-09-30: qty 1

## 2024-09-30 MED ORDER — ALBUTEROL SULFATE (2.5 MG/3ML) 0.083% IN NEBU
10.0000 mg | INHALATION_SOLUTION | Freq: Once | RESPIRATORY_TRACT | Status: AC
  Administered 2024-09-30: 10 mg via RESPIRATORY_TRACT

## 2024-09-30 MED ORDER — PANTOPRAZOLE SODIUM 40 MG PO TBEC
40.0000 mg | DELAYED_RELEASE_TABLET | Freq: Every day | ORAL | Status: DC
  Administered 2024-10-01 – 2024-10-04 (×4): 40 mg via ORAL
  Filled 2024-09-30 (×4): qty 1

## 2024-09-30 MED ORDER — ALBUTEROL SULFATE (2.5 MG/3ML) 0.083% IN NEBU
INHALATION_SOLUTION | RESPIRATORY_TRACT | Status: AC
  Filled 2024-09-30: qty 12

## 2024-09-30 MED ORDER — METHYLPREDNISOLONE SODIUM SUCC 125 MG IJ SOLR: 125.0000 mg | Freq: Once | INTRAMUSCULAR | Status: AC

## 2024-09-30 MED ORDER — MAGNESIUM SULFATE IN D5W 1-5 GM/100ML-% IV SOLN
INTRAVENOUS | Status: AC
  Filled 2024-09-30: qty 100

## 2024-09-30 MED ORDER — ACETAMINOPHEN 325 MG PO TABS
650.0000 mg | ORAL_TABLET | Freq: Four times a day (QID) | ORAL | Status: DC | PRN
  Administered 2024-10-02: 650 mg via ORAL
  Filled 2024-09-30: qty 2

## 2024-09-30 MED ORDER — SODIUM CHLORIDE 0.9 % IV SOLN
2.0000 g | INTRAVENOUS | Status: DC
  Administered 2024-10-01 – 2024-10-03 (×3): 2 g via INTRAVENOUS
  Filled 2024-09-30 (×3): qty 20

## 2024-09-30 MED ORDER — SODIUM CHLORIDE 0.9 % IV SOLN
100.0000 mg | Freq: Once | INTRAVENOUS | Status: AC
  Administered 2024-09-30: 100 mg via INTRAVENOUS
  Filled 2024-09-30 (×2): qty 100

## 2024-09-30 MED ORDER — ATORVASTATIN CALCIUM 40 MG PO TABS
80.0000 mg | ORAL_TABLET | Freq: Every day | ORAL | Status: DC
  Administered 2024-10-01 – 2024-10-04 (×4): 80 mg via ORAL
  Filled 2024-09-30 (×3): qty 2
  Filled 2024-09-30: qty 8

## 2024-09-30 MED ORDER — IPRATROPIUM BROMIDE 0.02 % IN SOLN
RESPIRATORY_TRACT | Status: AC
  Filled 2024-09-30: qty 10

## 2024-09-30 MED ORDER — CHLORHEXIDINE GLUCONATE CLOTH 2 % EX PADS
6.0000 | MEDICATED_PAD | Freq: Every day | CUTANEOUS | Status: DC
Start: 2024-10-01 — End: 2024-10-02
  Administered 2024-09-30 – 2024-10-01 (×2): 6 via TOPICAL

## 2024-09-30 MED ORDER — SODIUM CHLORIDE 0.9 % IV SOLN
1.0000 g | Freq: Once | INTRAVENOUS | Status: AC
  Administered 2024-09-30: 1 g via INTRAVENOUS
  Filled 2024-09-30: qty 10

## 2024-09-30 MED ORDER — EZETIMIBE 10 MG PO TABS
10.0000 mg | ORAL_TABLET | Freq: Every day | ORAL | Status: DC
  Administered 2024-10-01 – 2024-10-04 (×4): 10 mg via ORAL
  Filled 2024-09-30 (×4): qty 1

## 2024-09-30 MED ORDER — SODIUM CHLORIDE 0.9 % IV SOLN
100.0000 mg | Freq: Two times a day (BID) | INTRAVENOUS | Status: DC
  Administered 2024-10-01 – 2024-10-03 (×6): 100 mg via INTRAVENOUS
  Filled 2024-09-30 (×9): qty 100

## 2024-09-30 MED ORDER — FUROSEMIDE 10 MG/ML IJ SOLN
40.0000 mg | Freq: Once | INTRAMUSCULAR | Status: AC
  Administered 2024-09-30: 40 mg via INTRAVENOUS
  Filled 2024-09-30: qty 4

## 2024-09-30 NOTE — ED Notes (Signed)
 Respiratory at bedside to help transport pt upstairs

## 2024-09-30 NOTE — ED Notes (Signed)
 MD at bedside.

## 2024-09-30 NOTE — Progress Notes (Signed)
 Transported patient on Bipap from ER to ICU6 without incident.  Report given to ICU RT.

## 2024-09-30 NOTE — Progress Notes (Signed)
 Breathing treatment given to patient.  Was also able to wean patient's FIO2 and Bipap settings.  Patient is currently on 10/5, BUR 16, 40% with adequate sats.  Will continue to monitor.

## 2024-09-30 NOTE — H&P (Incomplete)
 History and Physical    Patient: Clarence Dawson FMW:[id number] DOB: March 09, 1946 DOA: 09/30/2024 DOS: the patient was seen and examined on 09/30/2024 PCP: Sheryle Carwin, MD  Patient coming from: Home  Chief Complaint:  Chief Complaint  Patient presents with  . Respiratory Distress   HPI: Clarence Dawson is a 78 y.o. male with medical history significant of persistent atrial fibrillation, asthma-COPD overlap syndrome, bronchiectasis without complication, pulmonary hypertension, hyperlipidemia, sinus node dysfunction s/p PPM, chronic respiratory failure with hypoxia on supplemental oxygen  via [place] at 4.5 LPM at home, obstructive sleep apnea on BiPAP who presents to the emergency department due to increasing breathing difficulty with no relief on use of home meds started after waking up this morning.  EMS was activated, and on arrival of EMS team, O2 sat was noted to be 70% and patient was cyanotic, CPAP was placed with improvement of O2 sat to high 70s.  On arrival to the ED, he was seen in respiratory distress, it continues to be cyanotic but was able to answer questions and follow commands.  Patient was quickly transitioned to BiPAP with O2 sat improvement to high 90s  ED Course:  In the emergency department, he was febrile with a temperature of 100.6 F, BP was 112/46, other vital signs were within normal range, O2 sat was 97% on BiPAP with FiO2 of 65%.  Workup in the ED showed normal CBC except for leukocytosis with WBC of 16.8.  BMP showed sodium 139, potassium 5.9, chloride 96, bicarb 17, blood glucose 95, BUN/creatinine 13/2.08 (baseline creatinine at 1.0-1.2) eGFR at 32, anion gap 27.  proBNP 15,000 139, troponin 71. Chest x-ray showed hypoinflated lungs with retrocardiac opacification likely related to at least partial left lower lobe collapse. Emphysema. Breathing treatment was provided with albuterol , Atrovent  Nancyi IV Solu-Medrol  25 mg Was Given.  Patient was treated with IV Ceftriaxone  and  Doxycycline .  IV Lasix  40 Mg x 1 was given.  Magnesium  and Lokelma were provided.  TRH was asked to admit patient  Review of Systems: Review of systems as noted in the HPI. All other systems reviewed and are negative.   Past Medical History:  Diagnosis Date  . Allergic rhinitis   . Aortic valve disorder   . Asthma    since childhood- seasonal allergies induced  . Cancer (HCC)    Skin cancer- squamous, basal  . Carotid artery stenosis   . CHF (congestive heart failure) (HCC)   . COPD (chronic obstructive pulmonary disease) (HCC)   . Essential hypertension   . Full dentures   . GERD (gastroesophageal reflux disease)   . H/O hiatal hernia   . Hemorrhage of rectum   . Hyperlipidemia   . Hypothyroidism   . 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 OSA (obstructive sleep apnea)   . Osteoarthritis   . Pacemaker    Oct 2005 in [place].  SABRA PAF (paroxysmal atrial fibrillation) (HCC)   . Pneumonia    several Times 2015 last time  . Primary localized osteoarthritis of right knee 08/11/2017  . RBBB (right bundle branch block)   . Sinoatrial node dysfunction (HCC)   . Syncope   . Tricuspid valve disorder   . Type 2 diabetes mellitus (HCC)    Type II   Past Surgical History:  Procedure Laterality Date  . A-V CARDIAC PACEMAKER INSERTION     Sick sinus syndrome DDR pacer  . Arthropathy Right 2005   Rebuilding of left thumb and joint   . CARDIAC CATHETERIZATION    .  CARDIAC ELECTROPHYSIOLOGY STUDY AND ABLATION  09/2008   for pvcs, Dr. Dandamudi  . CARDIOVERSION N/A 12/18/2016   Procedure: CARDIOVERSION;  Surgeon: Vinie JAYSON Maxcy, MD;  Location: [facility] ENDOSCOPY;  Service: Cardiovascular;  Laterality: N/A;  . CARDIOVERSION N/A 09/05/2021   Procedure: CARDIOVERSION;  Surgeon: Barbaraann Darryle Ned, MD;  Location: [facility] ENDOSCOPY;  Service: Cardiovascular;  Laterality: N/A;  . CARDIOVERSION N/A 08/06/2022   Procedure: CARDIOVERSION;  Surgeon: Kate Lonni CROME, MD;  Location: [facility] ENDOSCOPY;  Service:  Cardiovascular;  Laterality: N/A;  . CARPAL TUNNEL RELEASE  1994   right wrist  . CARPAL TUNNEL RELEASE  05/04/2012   Procedure: CARPAL TUNNEL RELEASE;  Surgeon: Arley JONELLE Curia, MD;  Location: [facility];  Service: Orthopedics;  Laterality: Left;  . CARPOMETACARPEL SUSPENSION PLASTY Right 11/16/2014   Procedure: SUSPENSIONPLASTY RIGHT THUMB TENDON TRANSFER ABDUCTOR POLLICUS LONGUS EXCISION TRAPEZIUM;  Surgeon: Arley Curia, MD;  Location: [facility];  Service: Orthopedics;  Laterality: Right;  . CHOLECYSTECTOMY  1994  . CIRCUMCISION    . COLONOSCOPY N/A 03/14/2013   Procedure: COLONOSCOPY;  Surgeon: Lamar CHRISTELLA Hollingshead, MD;  [facility];  Service: Endoscopy;  Laterality: N/A;  8:15 AM  . EYE SURGERY     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  . EYE SURGERY  2012   Left eye Corneal transplant- partial- Cataract  . FLEXIBLE BRONCHOSCOPY Right 06/23/2019   Procedure: FLEXIBLE BRONCHOSCOPY RIGHT;  Surgeon: Verdia Art, MD;  Location: [facility] ORS;  Service: Pulmonary;  Laterality: Right;  . GALLBLADDER SURGERY  12/01/2006  . HAND TENDON SURGERY Left late 1990's   thumb  . HEMORROIDECTOMY  2003  . INJECTION KNEE Left 08/26/2017   Procedure: LEFT KNEE INJECTION;  Surgeon: Jane Lamar, MD;  Location[facility] OR;  Service: Orthopedics;  Laterality: Left;  . left Knee Arthroscopy     [facility]- [facility]  . PARTIAL KNEE ARTHROPLASTY  11/22/2012   Procedure: UNICOMPARTMENTAL KNEE;  Surgeon: Lamar DELENA Jane, MD;  Location: [facility] OR;  Service: Orthopedics;  Laterality: Left;  left unicompartmental knee arthroplasty  . PERMANENT PACEMAKER GENERATOR CHANGE N/A 01/12/2013   Procedure: PERMANENT PACEMAKER GENERATOR CHANGE;  Surgeon: Danelle LELON Birmingham, MD;  Location: [facility] CATH LAB;  Service: Cardiovascular;  Laterality: N/A;  . PPM GENERATOR CHANGEOUT N/A 07/07/2022   Procedure: PPM GENERATOR CHANGEOUT;  Surgeon: Birmingham Danelle LELON, MD;  Location: [facility] INVASIVE CV LAB;   Service: Cardiovascular;  Laterality: N/A;  . RIGHT/LEFT HEART CATH AND CORONARY ANGIOGRAPHY N/A 08/28/2023   Procedure: RIGHT/LEFT HEART CATH AND CORONARY ANGIOGRAPHY;  Surgeon: Rolan Ezra RAMAN, MD;  Location: [facility] INVASIVE CV LAB;  Service: Cardiovascular;  Laterality: N/A;  . Rotator cuff Surgery  2001   Right shoulder  . TONSILLECTOMY    . TOTAL KNEE ARTHROPLASTY Right 08/26/2017   Procedure: TOTAL KNEE ARTHROPLASTY;  Surgeon: Jane Lamar, MD;  Location: [facility] OR;  Service: Orthopedics;  Laterality: Right;  . varicose vein reduction      Social History:  reports that he quit smoking about 40 years ago. His smoking use included cigarettes. He started smoking about 66 years ago. He has a 77.9 pack-year smoking history. He has quit using smokeless tobacco.  His smokeless tobacco use included chew. He reports current alcohol  use of about 2.0 - 4.0 standard drinks of alcohol  per week. He reports that he does not use drugs.   Allergies  Allergen Reactions  . Food Anaphylaxis and Shortness Of Breath    TREE NUTS  .  Iodinated Contrast Media Hives and Shortness Of Breath    Patient states hives to throat closing. (01/15/17: patient states this reaction was about 20 years ago with possibly an IVP.  He now says high doses of prednisone  throw me into AFib.  He has tolerated CT arthrograms with Benadrly 50mg  PO one hour before injection.  Rudolph Loss, RN)  . Shellfish Allergy  Anaphylaxis and Shortness Of Breath    To shellfish, crabs.  Makes him feel like things are crawling all over me.  Denies airway issues with these foods.  Arma Loss, RN 01/15/17)  . Goat-Derived Products Hives    GOAT CHEESE   . Prednisone  Palpitations    PRECIPITATES A-FIB  . Diclofenac Sodium Other (See Comments)    Hives, buggy feeling all over  . Metformin  And Related Diarrhea    High doses at once  . Vancomycin  Anxiety    Red man syndrome  . Voltaren [Diclofenac Sodium] Other (See Comments)    Feels like  things are crawling on him    Family History  Problem Relation Age of Onset  . Liver cancer Mother   . Pancreatic cancer Mother   . Colon cancer Mother   . Hypertension Father   . Stroke Father   . Other Father 57       Sudden Cardiac death  . Heart attack Father   . Cancer Sister        brain  . Diabetes Sister   . Colon cancer Maternal Aunt   . Colon polyps Neg Hx     ***  Prior to Admission medications   Medication Sig Start Date End Date Taking? Authorizing Provider  acetaminophen  (TYLENOL ) 500 MG tablet Take 1,000 mg by mouth 3 (three) times daily.    [provider]  albuterol  (PROVENTIL ) (2.5 MG/3ML) 0.083% nebulizer solution INHALE ONE VIAL VIA NEBULIZER EVERY 6 HOURS AS NEEDED FOR WHEEZING OR SHORTNESS OF BREATH 09/01/24   Tamea Dedra CROME, MD  albuterol  (VENTOLIN  HFA) 108 (90 Base) MCG/ACT inhaler INHALE 2 PUFFS INTO THE LUNGS EVERY 4 HOURS AS NEEDED, ONLY IF YOU CAN'T CATCH YOUR BREATH 06/02/24   Tamea Dedra CROME, MD  atorvastatin  (LIPITOR) 80 MG tablet Take 1 tablet (80 mg total) by mouth daily. 04/15/24   Rolan Ezra RAMAN, MD  bisoprolol  (ZEBETA ) 5 MG tablet TAKE 1 AND 1/2 TABLET BY MOUTH TWICE DAILY. 04/17/17   Dow Arland BROCKS, NP  BREZTRI  AEROSPHERE 160-9-4.8 MCG/ACT AERO inhaler INHALE 2 PUFFS INTO THE LUNGS TWICE DAILY. 09/26/24   Tamea Dedra CROME, MD  Carboxymethylcellul-Glycerin (LUBRICATING EYE DROPS OP) Place 1 drop into the right eye 2 (two) times a week. Clear eyes as needed Patient taking differently: Place 1 drop into the right eye once a week. Clear eyes as needed    [provider]  diltiazem  (CARDIZEM  CD) 120 MG 24 hr capsule TAKE (1) CAPSULE BY MOUTH DAILY, MAY TAKE A EXTRA CAPSULE DAILY FOR BREAKTHROUGH AFIB. 12/09/23   Waddell Danelle ORN, MD  dofetilide  (TIKOSYN ) 250 MCG capsule TAKE ONE CAPSULE BY MOUTH TWICE DAILY 07/27/24   Rolan Ezra RAMAN, MD  Dupilumab  (DUPIXENT ) 300 MG/2ML SOAJ Inject 300 mg into the skin every 14 (fourteen) days.  04/25/24   Tamea Dedra CROME, MD  Ensifentrine  (OHTUVAYRE ) 3 MG/2.5ML SUSP Take 3 mg by nebulization 2 (two) times daily. 06/13/24   Tamea Dedra CROME, MD  EPINEPHrine  0.3 mg/0.3 mL IJ SOAJ injection Inject 0.3 mg into the muscle as needed for anaphylaxis. 11/05/20  Tamea Dedra CROME, MD  ezetimibe  (ZETIA ) 10 MG tablet Take 1 tablet (10 mg total) by mouth daily. 06/20/24 09/27/24  Rolan Ezra RAMAN, MD  fluticasone  (FLONASE ) 50 MCG/ACT nasal spray Place 2 sprays into both nostrils daily. 10/21/23   Tobie Eldora NOVAK, MD  furosemide  (LASIX ) 40 MG tablet Take 1.5 tablets (60 mg total) by mouth 2 (two) times daily. 06/10/24   Rolan Ezra RAMAN, MD  glipiZIDE  (GLUCOTROL  XL) 5 MG 24 hr tablet Take 5 mg by mouth daily before breakfast.    [provider]  HYDROcodone -acetaminophen  (NORCO/VICODIN) 5-325 MG tablet Take 1 tablet by mouth every 6 (six) hours as needed. 09/26/24   [provider]  JARDIANCE  25 MG TABS tablet Take 25 mg by mouth daily before breakfast. 04/11/21   [provider]  LANTUS  100 UNIT/ML injection Inject 10 Units into the skin every morning. 09/20/24   [provider]  levothyroxine  (SYNTHROID ) 150 MCG tablet Take 150 mcg by mouth daily before breakfast. 06/01/23   [provider]  losartan  (COZAAR ) 50 MG tablet Take 50 mg by mouth at bedtime. 04/27/20   [provider]  metFORMIN  (GLUCOPHAGE -XR) 500 MG 24 hr tablet Take 500 mg by mouth in the morning and at bedtime.    [provider]  Multiple Vitamin (MULTIVITAMIN) tablet Take 1 tablet by mouth daily.    [provider]  omeprazole  (PRILOSEC  OTC) 20 MG tablet Take 20 mg by mouth every morning.    [provider]  predniSONE  (DELTASONE ) 10 MG tablet Take 1 tablet (10 mg total) by mouth daily with breakfast. 08/31/24   Tamea Dedra CROME, MD  PRESCRIPTION MEDICATION 2 (two) times daily. Thumper vest Patient taking differently: 2 (two) times daily. Thumper vest-  sometimes three times daily    [provider]  Respiratory Therapy Supplies (FLUTTER) DEVI Use as directed 10/10/19   Tamea Dedra CROME, MD  rivaroxaban  (XARELTO ) 20 MG TABS tablet Take 20 mg by mouth at bedtime.    [provider]  sodium chloride  (OCEAN) 0.65 % SOLN nasal spray Place 1 spray into both nostrils as needed for congestion.    [provider]  spironolactone  (ALDACTONE ) 25 MG tablet Take 0.5 tablets (12.5 mg total) by mouth daily. 06/10/24   Rolan Ezra RAMAN, MD    Physical Exam: BP (!) 90/54   Pulse 75   Temp (!) 100.6 F (38.1 C) (Axillary)   Resp 19   Ht 5' 9 (1.753 m)   Wt 96 kg   SpO2 96%   BMI 31.25 kg/m   General: 78 y.o. year-old male well developed well nourished in no acute distress.  Alert and oriented x3. HEENT: NCAT, EOMI Neck: Supple, trachea medial Cardiovascular: Regular rate and rhythm with no rubs or gallops.  No thyromegaly or JVD noted.  No lower extremity edema. 2/4 pulses in all 4 extremities. Respiratory: Clear to auscultation with no wheezes or rales. Good inspiratory effort. Abdomen: Soft, nontender nondistended with normal bowel sounds x4 quadrants. Muskuloskeletal: No cyanosis, clubbing or edema noted bilaterally Neuro: CN II-XII intact, strength 5/5 x 4, sensation, reflexes intact Skin: No ulcerative lesions noted or rashes Psychiatry: Judgement and insight appear normal. Mood is appropriate for condition and setting          Labs on Admission:  Basic Metabolic Panel: Recent Labs  Lab 09/30/24 2031 09/30/24 2105  NA 139 135  K 5.9* 6.0*  CL 96* 100  CO2 17*  --   GLUCOSE 295*  286*  BUN 38* 47*  CREATININE 2.08* 2.10*  CALCIUM  9.6  --    Liver Function Tests: No results for input(s): AST, ALT, ALKPHOS, BILITOT, PROT, ALBUMIN in the last 168 hours. No results for input(s): LIPASE, AMYLASE in the last 168 hours. No results for input(s): AMMONIA in the last 168 hours. CBC: Recent  Labs  Lab 09/30/24 2031 09/30/24 2105  WBC 16.8*  --   NEUTROABS 13.1*  --   HGB 15.7 15.3  HCT 48.9 45.0  MCV 93.9  --   PLT 249  --    Cardiac Enzymes: No results for input(s): CKTOTAL, CKMB, CKMBINDEX, TROPONINI in the last 168 hours.  BNP (last 3 results) Recent Labs    12/16/23 1210 04/06/24 1732 06/10/24 1507  BNP 256.5* 1,169.0* 529.8*    ProBNP (last 3 results) Recent Labs    09/30/24 2031  PROBNP 15,139.0*    CBG: No results for input(s): GLUCAP in the last 168 hours.  Radiological Exams on Admission: DG Chest Port 1 View Result Date: 09/30/2024 EXAM: 1 VIEW XRAY OF THE CHEST 09/30/2024 09:18:05 PM COMPARISON: 04/06/2024 CLINICAL HISTORY: Dib. Pt bib EMS for respiratory distress. EMS reports called to house for SOB that had gotten worse throughout day. EMS reports pt was on O2 @ 2L nasal cannula (normally wears O2 @ 4.5L) upon their arrival and sats were reading 62%. EMS placed pt on Cpap and ; reported sats of 75-78%. FINDINGS: LUNGS AND PLEURA: Hypoinflated lungs with retrocardiac opacification likely related to at least partial left lower lobe collapse. Emphysema. Unchanged bibasilar linear opacities. No pleural effusion. No pneumothorax. HEART AND MEDIASTINUM: Stable left chest wall dual lead pacemaker. No acute abnormality of the cardiac and mediastinal silhouettes. BONES AND SOFT TISSUES: No acute osseous abnormality. IMPRESSION: 1. Hypoinflated lungs with retrocardiac opacification likely related to at least partial left lower lobe collapse. 2. Emphysema. Electronically signed by: Dorethia Molt MD 09/30/2024 09:22 PM EDT RP Workstation: [id number]    EKG: I independently viewed the EKG done and my findings are as followed: Normal sinus rhythm at a rate of 78 bpm with RBBB  Assessment/Plan Present on Admission: . Sepsis due to pneumonia [facility])  Principal Problem:   Sepsis due to pneumonia (HCC)   Acute on chronic respiratory failure with  hypoxemia  Lactic acidosis  Sepsis due to presumed pneumonia POA Patient met sepsis criteria due to being febrile and presenting with leukocytosis (met SIRS criteria).  Source of infection was suspected to be the lungs Patient was started on ceftriaxone  and doxycycline , we shall continue same at this time with plan to de-escalate/discontinue based on blood culture, sputum culture, urine Legionella, strep pneumo and procalcitonin Continue Tylenol  as needed Continue Mucinex , incentive spirometry, flutter valve     Acute on chronic diastolic CHF Partial left lower lobe collapse Hyperkalemia High anion gap metabolic acidosis possibly due to DKA Type 2 diabetes mellitus with hyperglycemia  Acute kidney injury   Mixed hyperlipidemia Acquired hypothyroidism GERD    DVT prophylaxis: Xarelto   Code Status: ***   Family Communication: ***   Disposition Plan: ***   Consults called: ***   Admission status: ***     Posey Maier MD [facility] Pager [phone]  If 7PM-7AM, please contact night-coverage [email or web address] Password [email or web address]  09/30/2024, 10:04 PM       Review of Systems: {ROS_Text:26778} Past Medical History:  Diagnosis Date  . Allergic rhinitis   . Aortic valve disorder   . Asthma    since childhood- seasonal  allergies induced  . Cancer (HCC)    Skin cancer- squamous, basal  . Carotid artery stenosis   . CHF (congestive heart failure) (HCC)   . COPD (chronic obstructive pulmonary disease) (HCC)   . Essential hypertension   . Full dentures   . GERD (gastroesophageal reflux disease)   . H/O hiatal hernia   . Hemorrhage of rectum   . Hyperlipidemia   . Hypothyroidism   . 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 OSA (obstructive sleep apnea)   . Osteoarthritis   . Pacemaker    Oct 2005 in [place].  SABRA PAF (paroxysmal atrial fibrillation) (HCC)   . Pneumonia    several Times 2015 last time  . Primary localized osteoarthritis of right knee 08/11/2017  . RBBB  (right bundle branch block)   . Sinoatrial node dysfunction (HCC)   . Syncope   . Tricuspid valve disorder   . Type 2 diabetes mellitus (HCC)    Type II   Past Surgical History:  Procedure Laterality Date  . A-V CARDIAC PACEMAKER INSERTION     Sick sinus syndrome DDR pacer  . Arthropathy Right 2005   Rebuilding of left thumb and joint   . CARDIAC CATHETERIZATION    . CARDIAC ELECTROPHYSIOLOGY STUDY AND ABLATION  09/2008   for pvcs, Dr. Dandamudi  . CARDIOVERSION N/A 12/18/2016   Procedure: CARDIOVERSION;  Surgeon: Vinie JAYSON Maxcy, MD;  Location: [facility] ENDOSCOPY;  Service: Cardiovascular;  Laterality: N/A;  . CARDIOVERSION N/A 09/05/2021   Procedure: CARDIOVERSION;  Surgeon: Barbaraann Darryle Ned, MD;  Location: [facility] ENDOSCOPY;  Service: Cardiovascular;  Laterality: N/A;  . CARDIOVERSION N/A 08/06/2022   Procedure: CARDIOVERSION;  Surgeon: Kate Lonni CROME, MD;  Location: [facility] ENDOSCOPY;  Service: Cardiovascular;  Laterality: N/A;  . CARPAL TUNNEL RELEASE  1994   right wrist  . CARPAL TUNNEL RELEASE  05/04/2012   Procedure: CARPAL TUNNEL RELEASE;  Surgeon: Arley JONELLE Curia, MD;  Location: [facility];  Service: Orthopedics;  Laterality: Left;  . CARPOMETACARPEL SUSPENSION PLASTY Right 11/16/2014   Procedure: SUSPENSIONPLASTY RIGHT THUMB TENDON TRANSFER ABDUCTOR POLLICUS LONGUS EXCISION TRAPEZIUM;  Surgeon: Arley Curia, MD;  Location: [facility];  Service: Orthopedics;  Laterality: Right;  . CHOLECYSTECTOMY  1994  . CIRCUMCISION    . COLONOSCOPY N/A 03/14/2013   Procedure: COLONOSCOPY;  Surgeon: Lamar CHRISTELLA Hollingshead, MD;  [facility];  Service: Endoscopy;  Laterality: N/A;  8:15 AM  . EYE SURGERY     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-[facility]  . EYE SURGERY  2012   Left eye Corneal transplant- partial- Cataract  . FLEXIBLE BRONCHOSCOPY Right 06/23/2019   Procedure: FLEXIBLE BRONCHOSCOPY RIGHT;  Surgeon: Verdia Art, MD;  Location: [facility] ORS;  Service:  Pulmonary;  Laterality: Right;  . GALLBLADDER SURGERY  12/01/2006  . HAND TENDON SURGERY Left late 1990's   thumb  . HEMORROIDECTOMY  2003  . INJECTION KNEE Left 08/26/2017   Procedure: LEFT KNEE INJECTION;  Surgeon: Jane Lamar, MD;  Location: [facility] OR;  Service: Orthopedics;  Laterality: Left;  . left Knee Arthroscopy     [facility]- [facility]  . PARTIAL KNEE ARTHROPLASTY  11/22/2012   Procedure: UNICOMPARTMENTAL KNEE;  Surgeon: Lamar DELENA Jane, MD;  Location[facility] OR;  Service: Orthopedics;  Laterality: Left;  left unicompartmental knee arthroplasty  . PERMANENT PACEMAKER GENERATOR CHANGE N/A 01/12/2013   Procedure: PERMANENT PACEMAKER GENERATOR CHANGE;  Surgeon: Danelle LELON Birmingham, MD;  Location: [facility] CATH LAB;  Service: Cardiovascular;  Laterality: N/A;  .  PPM GENERATOR CHANGEOUT N/A 07/07/2022   Procedure: PPM GENERATOR CHANGEOUT;  Surgeon: Waddell Danelle ORN, MD;  Location: [facility] INVASIVE CV LAB;  Service: Cardiovascular;  Laterality: N/A;  . RIGHT/LEFT HEART CATH AND CORONARY ANGIOGRAPHY N/A 08/28/2023   Procedure: RIGHT/LEFT HEART CATH AND CORONARY ANGIOGRAPHY;  Surgeon: Rolan Ezra RAMAN, MD;  Location: [facility] INVASIVE CV LAB;  Service: Cardiovascular;  Laterality: N/A;  . Rotator cuff Surgery  2001   Right shoulder  . TONSILLECTOMY    . TOTAL KNEE ARTHROPLASTY Right 08/26/2017   Procedure: TOTAL KNEE ARTHROPLASTY;  Surgeon: Jane Charleston, MD;  Location: [facility] OR;  Service: Orthopedics;  Laterality: Right;  . varicose vein reduction     Social History:  reports that he quit smoking about 40 years ago. His smoking use included cigarettes. He started smoking about 66 years ago. He has a 77.9 pack-year smoking history. He has quit using smokeless tobacco.  His smokeless tobacco use included chew. He reports current alcohol  use of about 2.0 - 4.0 standard drinks of alcohol  per week. He reports that he does not use drugs.  Allergies  Allergen Reactions  . Food Anaphylaxis and Shortness Of Breath     TREE NUTS  . Iodinated Contrast Media Hives and Shortness Of Breath    Patient states hives to throat closing. (01/15/17: patient states this reaction was about 20 years ago with possibly an IVP.  He now says high doses of prednisone  throw me into AFib.  He has tolerated CT arthrograms with Benadrly 50mg  PO one hour before injection.  Rudolph Loss, RN)  . Shellfish Allergy  Anaphylaxis and Shortness Of Breath    To shellfish, crabs.  Makes him feel like things are crawling all over me.  Denies airway issues with these foods.  Arma Loss, RN 01/15/17)  . Goat-Derived Products Hives    GOAT CHEESE   . Prednisone  Palpitations    PRECIPITATES A-FIB  . Diclofenac Sodium Other (See Comments)    Hives, buggy feeling all over  . Metformin  And Related Diarrhea    High doses at once  . Vancomycin  Anxiety    Red man syndrome  . Voltaren [Diclofenac Sodium] Other (See Comments)    Feels like things are crawling on him    Family History  Problem Relation Age of Onset  . Liver cancer Mother   . Pancreatic cancer Mother   . Colon cancer Mother   . Hypertension Father   . Stroke Father   . Other Father 13       Sudden Cardiac death  . Heart attack Father   . Cancer Sister        brain  . Diabetes Sister   . Colon cancer Maternal Aunt   . Colon polyps Neg Hx     Prior to Admission medications   Medication Sig Start Date End Date Taking? Authorizing Provider  acetaminophen  (TYLENOL ) 500 MG tablet Take 1,000 mg by mouth 3 (three) times daily.    [provider]  albuterol  (PROVENTIL ) (2.5 MG/3ML) 0.083% nebulizer solution INHALE ONE VIAL VIA NEBULIZER EVERY 6 HOURS AS NEEDED FOR WHEEZING OR SHORTNESS OF BREATH 09/01/24   Tamea Dedra CROME, MD  albuterol  (VENTOLIN  HFA) 108 (90 Base) MCG/ACT inhaler INHALE 2 PUFFS INTO THE LUNGS EVERY 4 HOURS AS NEEDED, ONLY IF YOU CAN'T CATCH YOUR BREATH 06/02/24   Tamea Dedra CROME, MD  atorvastatin  (LIPITOR) 80 MG tablet Take 1 tablet (80 mg  total) by mouth daily. 04/15/24   Rolan Ezra RAMAN, MD  bisoprolol  (ZEBETA ) 5 MG tablet TAKE 1 AND 1/2 TABLET BY MOUTH TWICE DAILY. 04/17/17   Dow Arland BROCKS, NP  BREZTRI  AEROSPHERE 160-9-4.8 MCG/ACT AERO inhaler INHALE 2 PUFFS INTO THE LUNGS TWICE DAILY. 09/26/24   Tamea Dedra CROME, MD  Carboxymethylcellul-Glycerin (LUBRICATING EYE DROPS OP) Place 1 drop into the right eye 2 (two) times a week. Clear eyes as needed Patient taking differently: Place 1 drop into the right eye once a week. Clear eyes as needed    [provider]  diltiazem  (CARDIZEM  CD) 120 MG 24 hr capsule TAKE (1) CAPSULE BY MOUTH DAILY, MAY TAKE A EXTRA CAPSULE DAILY FOR BREAKTHROUGH AFIB. 12/09/23   Waddell Danelle ORN, MD  dofetilide  (TIKOSYN ) 250 MCG capsule TAKE ONE CAPSULE BY MOUTH TWICE DAILY 07/27/24   McLean, Dalton S, MD  Dupilumab  (DUPIXENT ) 300 MG/2ML SOAJ Inject 300 mg into the skin every 14 (fourteen) days. 04/25/24   Tamea Dedra CROME, MD  Ensifentrine  (OHTUVAYRE ) 3 MG/2.5ML SUSP Take 3 mg by nebulization 2 (two) times daily. 06/13/24   Tamea Dedra CROME, MD  EPINEPHrine  0.3 mg/0.3 mL IJ SOAJ injection Inject 0.3 mg into the muscle as needed for anaphylaxis. 11/05/20   Tamea Dedra CROME, MD  ezetimibe  (ZETIA ) 10 MG tablet Take 1 tablet (10 mg total) by mouth daily. 06/20/24 09/27/24  Rolan Ezra RAMAN, MD  fluticasone  (FLONASE ) 50 MCG/ACT nasal spray Place 2 sprays into both nostrils daily. 10/21/23   Tobie Eldora NOVAK, MD  furosemide  (LASIX ) 40 MG tablet Take 1.5 tablets (60 mg total) by mouth 2 (two) times daily. 06/10/24   Rolan Ezra RAMAN, MD  glipiZIDE  (GLUCOTROL  XL) 5 MG 24 hr tablet Take 5 mg by mouth daily before breakfast.    [provider]  HYDROcodone -acetaminophen  (NORCO/VICODIN) 5-325 MG tablet Take 1 tablet by mouth every 6 (six) hours as needed. 09/26/24   [provider]  JARDIANCE  25 MG TABS tablet Take 25 mg by mouth daily before breakfast. 04/11/21   [provider]  LANTUS   100 UNIT/ML injection Inject 10 Units into the skin every morning. 09/20/24   [provider]  levothyroxine  (SYNTHROID ) 150 MCG tablet Take 150 mcg by mouth daily before breakfast. 06/01/23   [provider]  losartan  (COZAAR ) 50 MG tablet Take 50 mg by mouth at bedtime. 04/27/20   [provider]  metFORMIN  (GLUCOPHAGE -XR) 500 MG 24 hr tablet Take 500 mg by mouth in the morning and at bedtime.    [provider]  Multiple Vitamin (MULTIVITAMIN) tablet Take 1 tablet by mouth daily.    [provider]  omeprazole  (PRILOSEC  OTC) 20 MG tablet Take 20 mg by mouth every morning.    [provider]  predniSONE  (DELTASONE ) 10 MG tablet Take 1 tablet (10 mg total) by mouth daily with breakfast. 08/31/24   Tamea Dedra CROME, MD  PRESCRIPTION MEDICATION 2 (two) times daily. Thumper vest Patient taking differently: 2 (two) times daily. Thumper vest- sometimes three times daily    [provider]  Respiratory Therapy Supplies (FLUTTER) DEVI Use as directed 10/10/19   Tamea Dedra CROME, MD  rivaroxaban  (XARELTO ) 20 MG TABS tablet Take 20 mg by mouth at bedtime.    [provider]  sodium chloride  (OCEAN) 0.65 % SOLN nasal spray Place 1 spray into both nostrils as needed for congestion.    [provider]  spironolactone  (ALDACTONE ) 25 MG tablet Take 0.5 tablets (12.5 mg total) by mouth daily. 06/10/24   Rolan Ezra RAMAN, MD  Physical Exam: Vitals:   09/30/24 2100 09/30/24 2115 09/30/24 2130 09/30/24 2145  BP: (!) 112/46 90/60 104/63   Pulse: 60 85 (!) 105 (!) 104  Resp: 20 15 16 18   Temp:      TempSrc:      SpO2: 97% 93% 96% 100%  Weight:      Height:       *** Data Reviewed: {Tip this will not be part of the note when signed- Document your independent interpretation of telemetry tracing, EKG, lab, Radiology test or any other diagnostic tests. Add any new diagnostic test ordered  today. (Optional):26781} {Results:26384}  Assessment and Plan: No notes have been filed under this hospital service. Service: Hospitalist     Advance Care Planning:   Code Status: Prior ***  Consults: ***  Family Communication: ***  Severity of Illness: {Observation/Inpatient:21159}  Author: Posey Maier, DO 09/30/2024 9:57 PM  For on call review [email or web address].

## 2024-09-30 NOTE — H&P (Addendum)
 History and Physical    Patient: Clarence Dawson FMW:[id number] DOB: August 21, 1946 DOA: 09/30/2024 DOS: the patient was seen and examined on 10/01/2024 PCP: Sheryle Carwin, MD  Patient coming from: Home  Chief Complaint:  Chief Complaint  Patient presents with   Respiratory Distress   HPI: NAI DASCH is a 78 y.o. male with medical history significant of persistent atrial fibrillation, asthma-COPD overlap syndrome, bronchiectasis without complication, pulmonary hypertension, hyperlipidemia, sinus node dysfunction s/p PPM, chronic respiratory failure with hypoxia on supplemental oxygen  via [place] at 4.5 LPM at home, obstructive sleep apnea on BiPAP who presents to the emergency department due to increasing breathing difficulty with no relief on use of home meds started after waking up this morning.  EMS was activated, and on arrival of EMS team, O2 sat was noted to be 70% and patient was cyanotic, CPAP was placed with improvement of O2 sat to high 70s.  On arrival to the ED, he was seen in respiratory distress, it continues to be cyanotic but was able to answer questions and follow commands.  Patient was quickly transitioned to BiPAP with O2 sat improvement to high 90s  ED Course:  In the emergency department, he was febrile with a temperature of 100.6 F, BP was 112/46, other vital signs were within normal range, O2 sat was 97% on BiPAP with FiO2 of 65%.  Workup in the ED showed normal CBC except for leukocytosis with WBC of 16.8.  BMP showed sodium 139, potassium 5.9, chloride 96, bicarb 17, blood glucose 95, BUN/creatinine 13/2.08 (baseline creatinine at 1.0-1.2) eGFR at 32, anion gap 27.  proBNP 15,000 139, troponin 71. Chest x-ray showed hypoinflated lungs with retrocardiac opacification likely related to at least partial left lower lobe collapse. Emphysema. Breathing treatment was provided with albuterol , Atrovent  Nancyi IV Solu-Medrol  25 mg Was Given.  Patient was treated with IV Ceftriaxone  and  Doxycycline .  IV Lasix  40 Mg x 1 was given.  Magnesium  and Lokelma were provided.  TRH was asked to admit patient  Review of Systems: Review of systems as noted in the HPI. All other systems reviewed and are negative.   Past Medical History:  Diagnosis Date   Allergic rhinitis    Aortic valve disorder    Asthma    since childhood- seasonal allergies induced   Cancer (HCC)    Skin cancer- squamous, basal   Carotid artery stenosis    CHF (congestive heart failure) (HCC)    COPD (chronic obstructive pulmonary disease) (HCC)    Essential hypertension    Full dentures    GERD (gastroesophageal reflux disease)    H/O hiatal hernia    Hemorrhage of rectum    Hyperlipidemia    Hypothyroidism    Male circumcision    OSA (obstructive sleep apnea)    Osteoarthritis    Pacemaker    Oct 2005 in [place].   PAF (paroxysmal atrial fibrillation) (HCC)    Pneumonia    several Times 2015 last time   Primary localized osteoarthritis of right knee 08/11/2017   RBBB (right bundle branch block)    Sinoatrial node dysfunction (HCC)    Syncope    Tricuspid valve disorder    Type 2 diabetes mellitus (HCC)    Type II   Past Surgical History:  Procedure Laterality Date   A-V CARDIAC PACEMAKER INSERTION     Sick sinus syndrome DDR pacer   Arthropathy Right 2005   Rebuilding of left thumb and joint    CARDIAC CATHETERIZATION  CARDIAC ELECTROPHYSIOLOGY STUDY AND ABLATION  09/2008   for pvcs, Dr. Andreas   CARDIOVERSION N/A 12/18/2016   Procedure: CARDIOVERSION;  Surgeon: Vinie JAYSON Maxcy, MD;  Location: [facility] ENDOSCOPY;  Service: Cardiovascular;  Laterality: N/A;   CARDIOVERSION N/A 09/05/2021   Procedure: CARDIOVERSION;  Surgeon: Barbaraann Darryle Ned, MD;  Location: [facility] ENDOSCOPY;  Service: Cardiovascular;  Laterality: N/A;   CARDIOVERSION N/A 08/06/2022   Procedure: CARDIOVERSION;  Surgeon: Kate Lonni CROME, MD;  Location: [facility] ENDOSCOPY;  Service: Cardiovascular;  Laterality: N/A;    CARPAL TUNNEL RELEASE  1994   right wrist   CARPAL TUNNEL RELEASE  05/04/2012   Procedure: CARPAL TUNNEL RELEASE;  Surgeon: Arley JONELLE Curia, MD;  Location: [facility];  Service: Orthopedics;  Laterality: Left;   CARPOMETACARPEL SUSPENSION PLASTY Right 11/16/2014   Procedure: SUSPENSIONPLASTY RIGHT THUMB TENDON TRANSFER ABDUCTOR POLLICUS LONGUS EXCISION TRAPEZIUM;  Surgeon: Arley Curia, MD;  Location: [facility];  Service: Orthopedics;  Laterality: Right;   CHOLECYSTECTOMY  1994   CIRCUMCISION     COLONOSCOPY N/A 03/14/2013   Procedure: COLONOSCOPY;  Surgeon: Lamar CHRISTELLA Hollingshead, MD;  Location: [facility];  Service: Endoscopy;  Laterality: N/A;  8:15 AM   EYE SURGERY     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-[facility]   EYE SURGERY  2012   Left eye Corneal transplant- partial- Cataract   FLEXIBLE BRONCHOSCOPY Right 06/23/2019   Procedure: FLEXIBLE BRONCHOSCOPY RIGHT;  Surgeon: Verdia Art, MD;  Location: [facility] ORS;  Service: Pulmonary;  Laterality: Right;   GALLBLADDER SURGERY  12/01/2006   HAND TENDON SURGERY Left late 1990's   thumb   HEMORROIDECTOMY  2003   INJECTION KNEE Left 08/26/2017   Procedure: LEFT KNEE INJECTION;  Surgeon: Jane Lamar, MD;  Location: [facility] OR;  Service: Orthopedics;  Laterality: Left;   left Knee Arthroscopy     [facility]   PARTIAL KNEE ARTHROPLASTY  11/22/2012   Procedure: UNICOMPARTMENTAL KNEE;  Surgeon: Lamar DELENA Jane, MD;  Location: [facility] OR;  Service: Orthopedics;  Laterality: Left;  left unicompartmental knee arthroplasty   PERMANENT PACEMAKER GENERATOR CHANGE N/A 01/12/2013   Procedure: PERMANENT PACEMAKER GENERATOR CHANGE;  Surgeon: Danelle LELON Birmingham, MD;  Location: [facility] CATH LAB;  Service: Cardiovascular;  Laterality: N/A;   PPM GENERATOR CHANGEOUT N/A 07/07/2022   Procedure: PPM GENERATOR CHANGEOUT;  Surgeon: Birmingham Danelle LELON, MD;  Location: [facility] INVASIVE CV LAB;  Service: Cardiovascular;  Laterality: N/A;    RIGHT/LEFT HEART CATH AND CORONARY ANGIOGRAPHY N/A 08/28/2023   Procedure: RIGHT/LEFT HEART CATH AND CORONARY ANGIOGRAPHY;  Surgeon: Rolan Ezra RAMAN, MD;  Location: [facility] INVASIVE CV LAB;  Service: Cardiovascular;  Laterality: N/A;   Rotator cuff Surgery  2001   Right shoulder   TONSILLECTOMY     TOTAL KNEE ARTHROPLASTY Right 08/26/2017   Procedure: TOTAL KNEE ARTHROPLASTY;  Surgeon: Jane Lamar, MD;  Location: [facility] OR;  Service: Orthopedics;  Laterality: Right;   varicose vein reduction      Social History:  reports that he quit smoking about 40 years ago. His smoking use included cigarettes. He started smoking about 66 years ago. He has a 77.9 pack-year smoking history. He has quit using smokeless tobacco.  His smokeless tobacco use included chew. He reports current alcohol  use of about 2.0 - 4.0 standard drinks of alcohol  per week. He reports that he does not use drugs.   Allergies  Allergen Reactions   Food Anaphylaxis and Shortness Of Breath    TREE NUTS  Iodinated Contrast Media Hives and Shortness Of Breath    Patient states hives to throat closing. (01/15/17: patient states this reaction was about 20 years ago with possibly an IVP.  He now says high doses of prednisone  throw me into AFib.  He has tolerated CT arthrograms with Benadrly 50mg  PO one hour before injection.  Rudolph Loss, RN)   Shellfish Allergy  Anaphylaxis and Shortness Of Breath    To shellfish, crabs.  Makes him feel like things are crawling all over me.  Denies airway issues with these foods.  Arma Loss, RN 01/15/17)   Goat-Derived Products Hives    GOAT CHEESE    Prednisone  Palpitations    PRECIPITATES A-FIB   Diclofenac Sodium Other (See Comments)    Hives, buggy feeling all over   Metformin  And Related Diarrhea    High doses at once   Vancomycin  Anxiety    Red man syndrome   Voltaren [Diclofenac Sodium] Other (See Comments)    Feels like things are crawling on him    Family History  Problem  Relation Age of Onset   Liver cancer Mother    Pancreatic cancer Mother    Colon cancer Mother    Hypertension Father    Stroke Father    Other Father 46       Sudden Cardiac death   Heart attack Father    Cancer Sister        brain   Diabetes Sister    Colon cancer Maternal Aunt    Colon polyps Neg Hx      Prior to Admission medications   Medication Sig Start Date End Date Taking? Authorizing Provider  acetaminophen  (TYLENOL ) 500 MG tablet Take 1,000 mg by mouth 3 (three) times daily.    [provider]  albuterol  (PROVENTIL ) (2.5 MG/3ML) 0.083% nebulizer solution INHALE ONE VIAL VIA NEBULIZER EVERY 6 HOURS AS NEEDED FOR WHEEZING OR SHORTNESS OF BREATH 09/01/24   Tamea Dedra CROME, MD  albuterol  (VENTOLIN  HFA) 108 (90 Base) MCG/ACT inhaler INHALE 2 PUFFS INTO THE LUNGS EVERY 4 HOURS AS NEEDED, ONLY IF YOU CAN'T CATCH YOUR BREATH 06/02/24   Tamea Dedra CROME, MD  atorvastatin  (LIPITOR) 80 MG tablet Take 1 tablet (80 mg total) by mouth daily. 04/15/24   Rolan Ezra RAMAN, MD  bisoprolol  (ZEBETA ) 5 MG tablet TAKE 1 AND 1/2 TABLET BY MOUTH TWICE DAILY. 04/17/17   Dow Arland BROCKS, NP  BREZTRI  AEROSPHERE 160-9-4.8 MCG/ACT AERO inhaler INHALE 2 PUFFS INTO THE LUNGS TWICE DAILY. 09/26/24   Tamea Dedra CROME, MD  Carboxymethylcellul-Glycerin (LUBRICATING EYE DROPS OP) Place 1 drop into the right eye 2 (two) times a week. Clear eyes as needed Patient taking differently: Place 1 drop into the right eye once a week. Clear eyes as needed    [provider]  diltiazem  (CARDIZEM  CD) 120 MG 24 hr capsule TAKE (1) CAPSULE BY MOUTH DAILY, MAY TAKE A EXTRA CAPSULE DAILY FOR BREAKTHROUGH AFIB. 12/09/23   Waddell Danelle ORN, MD  dofetilide  (TIKOSYN ) 250 MCG capsule TAKE ONE CAPSULE BY MOUTH TWICE DAILY 07/27/24   McLean, Dalton S, MD  Dupilumab  (DUPIXENT ) 300 MG/2ML SOAJ Inject 300 mg into the skin every 14 (fourteen) days. 04/25/24   Tamea Dedra CROME, MD  Ensifentrine  (OHTUVAYRE ) 3 MG/2.5ML SUSP  Take 3 mg by nebulization 2 (two) times daily. 06/13/24   Tamea Dedra CROME, MD  EPINEPHrine  0.3 mg/0.3 mL IJ SOAJ injection Inject 0.3 mg into the muscle as needed for anaphylaxis. 11/05/20  Tamea Dedra CROME, MD  ezetimibe  (ZETIA ) 10 MG tablet Take 1 tablet (10 mg total) by mouth daily. 06/20/24 09/27/24  Rolan Ezra RAMAN, MD  fluticasone  (FLONASE ) 50 MCG/ACT nasal spray Place 2 sprays into both nostrils daily. 10/21/23   Tobie Eldora NOVAK, MD  furosemide  (LASIX ) 40 MG tablet Take 1.5 tablets (60 mg total) by mouth 2 (two) times daily. 06/10/24   Rolan Ezra RAMAN, MD  glipiZIDE  (GLUCOTROL  XL) 5 MG 24 hr tablet Take 5 mg by mouth daily before breakfast.    [provider]  HYDROcodone -acetaminophen  (NORCO/VICODIN) 5-325 MG tablet Take 1 tablet by mouth every 6 (six) hours as needed. 09/26/24   [provider]  JARDIANCE  25 MG TABS tablet Take 25 mg by mouth daily before breakfast. 04/11/21   [provider]  LANTUS  100 UNIT/ML injection Inject 10 Units into the skin every morning. 09/20/24   [provider]  levothyroxine  (SYNTHROID ) 150 MCG tablet Take 150 mcg by mouth daily before breakfast. 06/01/23   [provider]  losartan  (COZAAR ) 50 MG tablet Take 50 mg by mouth at bedtime. 04/27/20   [provider]  metFORMIN  (GLUCOPHAGE -XR) 500 MG 24 hr tablet Take 500 mg by mouth in the morning and at bedtime.    [provider]  Multiple Vitamin (MULTIVITAMIN) tablet Take 1 tablet by mouth daily.    [provider]  omeprazole  (PRILOSEC  OTC) 20 MG tablet Take 20 mg by mouth every morning.    [provider]  predniSONE  (DELTASONE ) 10 MG tablet Take 1 tablet (10 mg total) by mouth daily with breakfast. 08/31/24   Tamea Dedra CROME, MD  PRESCRIPTION MEDICATION 2 (two) times daily. Thumper vest Patient taking differently: 2 (two) times daily. Thumper vest- sometimes three times daily    [provider]  Respiratory Therapy  Supplies (FLUTTER) DEVI Use as directed 10/10/19   Tamea Dedra CROME, MD  rivaroxaban  (XARELTO ) 20 MG TABS tablet Take 20 mg by mouth at bedtime.    [provider]  sodium chloride  (OCEAN) 0.65 % SOLN nasal spray Place 1 spray into both nostrils as needed for congestion.    [provider]  spironolactone  (ALDACTONE ) 25 MG tablet Take 0.5 tablets (12.5 mg total) by mouth daily. 06/10/24   Rolan Ezra RAMAN, MD    Physical Exam: BP (!) 122/55   Pulse 78   Temp 98.5 F (36.9 C) (Axillary)   Resp 18   Ht 5' 8 (1.727 m)   Wt 94.5 kg   SpO2 98%   BMI 31.68 kg/m   General: 78 y.o. year-old male ill-appearing and in mild acute distress.  Alert and oriented x3. HEENT: NCAT, EOMI Neck: Supple, trachea medial Cardiovascular: Regular rate and rhythm with no rubs or gallops.  No thyromegaly or JVD noted.  Trace lower extremity edema bilaterally. 2/4 pulses in all 4 extremities. Respiratory: Coarse breath sounds, scattered wheezes and bilateral Rales in lower lobes on auscultation.   Abdomen: Soft, nontender nondistended with normal bowel sounds x4 quadrants. Muskuloskeletal: RLE swelling (chronic)  > LLE.  No cyanosis, clubbing noted bilaterally Neuro: CN II-XII intact, strength 5/5 x 4, sensation, reflexes intact Skin: No ulcerative lesions noted or rashes Psychiatry: Judgement and insight appear normal. Mood is appropriate for condition and setting          Labs on Admission:  Basic Metabolic Panel: Recent Labs  Lab 09/30/24 2031 09/30/24 2105  NA 139 135  K 5.9* 6.0*  CL 96* 100  CO2  17*  --   GLUCOSE 295* 286*  BUN 38* 47*  CREATININE 2.08* 2.10*  CALCIUM  9.6  --    Liver Function Tests: No results for input(s): AST, ALT, ALKPHOS, BILITOT, PROT, ALBUMIN in the last 168 hours. No results for input(s): LIPASE, AMYLASE in the last 168 hours. No results for input(s): AMMONIA in the last 168 hours. CBC: Recent Labs  Lab 09/30/24 2031  09/30/24 2105  WBC 16.8*  --   NEUTROABS 13.1*  --   HGB 15.7 15.3  HCT 48.9 45.0  MCV 93.9  --   PLT 249  --    Cardiac Enzymes: No results for input(s): CKTOTAL, CKMB, CKMBINDEX, TROPONINI in the last 168 hours.  BNP (last 3 results) Recent Labs    12/16/23 1210 04/06/24 1732 06/10/24 1507  BNP 256.5* 1,169.0* 529.8*    ProBNP (last 3 results) Recent Labs    09/30/24 2031  PROBNP 15,139.0*    CBG: Recent Labs  Lab 10/01/24 0043  GLUCAP 365*    Radiological Exams on Admission: DG Chest Port 1 View Result Date: 09/30/2024 EXAM: 1 VIEW XRAY OF THE CHEST 09/30/2024 09:18:05 PM COMPARISON: 04/06/2024 CLINICAL HISTORY: Dib. Pt bib EMS for respiratory distress. EMS reports called to house for SOB that had gotten worse throughout day. EMS reports pt was on O2 @ 2L nasal cannula (normally wears O2 @ 4.5L) upon their arrival and sats were reading 62%. EMS placed pt on Cpap and ; reported sats of 75-78%. FINDINGS: LUNGS AND PLEURA: Hypoinflated lungs with retrocardiac opacification likely related to at least partial left lower lobe collapse. Emphysema. Unchanged bibasilar linear opacities. No pleural effusion. No pneumothorax. HEART AND MEDIASTINUM: Stable left chest wall dual lead pacemaker. No acute abnormality of the cardiac and mediastinal silhouettes. BONES AND SOFT TISSUES: No acute osseous abnormality. IMPRESSION: 1. Hypoinflated lungs with retrocardiac opacification likely related to at least partial left lower lobe collapse. 2. Emphysema. Electronically signed by: Dorethia Molt MD 09/30/2024 09:22 PM EDT RP Workstation: [id number]    EKG: I independently viewed the EKG done and my findings are as followed: Normal sinus rhythm at a rate of 78 bpm with RBBB  Assessment/Plan Present on Admission:  Sepsis due to pneumonia (HCC)  Acute on chronic respiratory failure with hypoxia (HCC)  Lactic acidosis  Acute exacerbation of COPD with asthma (HCC)  Acute on chronic  diastolic CHF (congestive heart failure) (HCC)  Elevated troponin  Mixed hyperlipidemia  Acquired hypothyroidism  Persistent atrial fibrillation  GERD (gastroesophageal reflux disease)  Principal Problem:   Acute on chronic respiratory failure with hypoxia (HCC) Active Problems:   Persistent atrial fibrillation   Mixed hyperlipidemia   OSA on CPAP   Acute exacerbation of COPD with asthma (HCC)   Sepsis due to pneumonia (HCC)   GERD (gastroesophageal reflux disease)   Acute on chronic diastolic CHF (congestive heart failure) (HCC)   Lactic acidosis   Elevated troponin   Acquired hypothyroidism   Hyperkalemia   Type 2 diabetes mellitus with hyperglycemia (HCC)   AKI (acute kidney injury)   Obesity, Class I, BMI 30-34.9   Acute on chronic respiratory failure with hypoxia Patient has hypoxia while on supplemental contingently to 4.5 LPM (home oxygen ) Continue BiPAP with plan to wean patient off this and transition to supplemental oxygen  as tolerated  Lactic acidosis possibly due to hypoxia Lactic acid 7.6 >>> 5.1, continue to trend lactic acid  Sepsis due to presumed CAP POA Patient met sepsis criteria due to being febrile and  presenting with leukocytosis (met SIRS criteria).  Source of infection was suspected to be the lungs Patient was started on ceftriaxone  and doxycycline , we shall continue same at this time with plan to de-escalate/discontinue based on blood culture, sputum culture, urine Legionella, strep pneumo and procalcitonin Continue Tylenol  as needed Continue Mucinex , incentive spirometry, flutter valve   Acute exacerbation of COPD/asthma Continue duo nebs, Mucinex , Solu-Medrol . Continue Protonix  to prevent steroid-induced ulcer Continue incentive spirometry and flutter valve Continue BiPAP with plan to wean patient to supplemental oxygen  to maintain O2 sat > 92% as tolerated  Acute on chronic diastolic CHF proBNP 15,139 Continue total input/output, daily  weights and fluid restriction He was treated with IV Lasix  40 mg x 1, continue IV Lasix  40 mg twice daily with holding parameters Continue heart healthy/carb modified diet  Echocardiogram done on 06/10/2024 showed LVEF of 60 to 65%.  No WMA.  LV diastolic parameters were normal. Echocardiogram done on 04/07/2024 showed LVEF 45 to 70%.  No WMA.  G2 DD Echocardiogram will be done in the morning   Partial left lower lobe collapse Chest x-ray showed partial left lower lobe collapse.  Continue incentive parameter  Hyperkalemia Potassium 5.9 Albuterol  was given, IV Lasix  40 mg x 1 was given Lokelma was provided Continue to monitor potassium level with morning labs No acute EKG changes  Type 2 diabetes mellitus with hyperglycemia Hemoglobin A1c on 04/06/24 was 7.3, this will be rechecked Continue Semglee  10 units nightly and adjust dose accordingly Continue ISS and hypoglycemia protocol  Acute kidney injury BUN/creatinine 13/2.08 (baseline creatinine at 1.0-1.2) Renally adjust medications, avoid nephrotoxic agents/dehydration/hypotension  Elevated troponin possibly secondary to type II demand ischemia Troponin 71 >>> 70, troponin already flattened.  Patient denies chest pain  Mixed hyperlipidemia Continue Lipitor, Zetia   Acquired hypothyroidism Continue Synthroid   Persistent atrial fibrillation Continue Xarelto , Tikosyn  Xarelto , Zebeta  to be held at this time due to soft BP  GERD Continue Protonix   Obstructive sleep apnea Continue BiPAP  Obesity class I (BMI 31.60) Diet and lifestyle modification   DVT prophylaxis: Xarelto   Code Status: Full code  Family Communication: Family at bedside (all questions answered to satisfaction)  Consults: None  Severity of Illness: The appropriate patient status for this patient is INPATIENT. Inpatient status is judged to be reasonable and necessary in order to provide the required intensity of service to ensure the patient's safety.  The patient's presenting symptoms, physical exam findings, and initial radiographic and laboratory data in the context of their chronic comorbidities is felt to place them at high risk for further clinical deterioration. Furthermore, it is not anticipated that the patient will be medically stable for discharge from the hospital within 2 midnights of admission.   * I certify that at the point of admission it is my clinical judgment that the patient will require inpatient hospital care spanning beyond 2 midnights from the point of admission due to high intensity of service, high risk for further deterioration and high frequency of surveillance required.*  Critical time: 65 minutes   Critical care personally provided  managing the patient due to high probability of clinically significant and life threatening deterioration. This critical care time included obtaining a history; examining the patient, pulse oximetry; ordering and review of studies; arranging urgent treatment with development of a management plan; evaluation of patient's response of treatment; frequent reassessment; and discussions with other providers.  This critical care time was performed to assess and manage the high probability of imminent and life threatening deterioration  that could result in multi-organ failure.   Author: Posey Maier, DO 10/01/2024 1:21 AM  For on call review [email or web address].

## 2024-09-30 NOTE — ED Triage Notes (Addendum)
 Pt bib EMS for respiratory distress. EMS reports called to house for SOB that had gotten worse throughout day. EMS reports pt was on O2 @^L nasal cannula (normally wears O2@4 .5L) upon their arrival and sats were reading 62%. EMS placed pt on Cpap and reported sats of 75-78%. Pt also reported to EMS a productive cough with bloody sputum.

## 2024-09-30 NOTE — ED Notes (Signed)
EDP and respiratory at bedside 

## 2024-09-30 NOTE — ED Provider Notes (Signed)
 [place] [facility] DE[facility]MENT AT [facility] Provider Note  CSN: [id number] Arrival date & time: 09/30/24 2027  Chief Complaint(s) Respiratory Distress  HPI Clarence Dawson is a 78 y.o. male with past medical history as below, significant for CHF, aortic valve disorder, COPD, HTN, HLD, T2DM, afib who presents to the ED with complaint of resp distress  Pt reports diff breathing over last day, unrelieved with home meds. On EMS arrival pulse ox 70% and pt cyanotic, placed on CPAP and did improve somewhat to upper 70%'s. On arrival pt acute resp distress, cyanotic but answering questions/following commands. Transitioned emergently to BIPAP with improvement   Able to obtain further history in the patient after transition to BiPAP.  Reports he is having productive cough over the past 1 to 2 days, dark-colored mucus, sometimes blood-streaked.  Subjective fever, chills, fatigue, malaise, body aches.  Worsening difficulty breathing.  No vomiting or nausea.  No sig chest pain does have some chest tightness  Past Medical History Past Medical History:  Diagnosis Date   Allergic rhinitis    Aortic valve disorder    Asthma    since childhood- seasonal allergies induced   Cancer (HCC)    Skin cancer- squamous, basal   Carotid artery stenosis    CHF (congestive heart failure) (HCC)    COPD (chronic obstructive pulmonary disease) (HCC)    Essential hypertension    Full dentures    GERD (gastroesophageal reflux disease)    H/O hiatal hernia    Hemorrhage of rectum    Hyperlipidemia    Hypothyroidism    Male circumcision    OSA (obstructive sleep apnea)    Osteoarthritis    Pacemaker    Oct 2005 in [place].   PAF (paroxysmal atrial fibrillation) (HCC)    Pneumonia    several Times 2015 last time   Primary localized osteoarthritis of right knee 08/11/2017   RBBB (right bundle branch block)    Sinoatrial node dysfunction (HCC)    Syncope    Tricuspid valve disorder    Type 2  diabetes mellitus (HCC)    Type II   Patient Active Problem List   Diagnosis Date Noted   Lactic acidosis 04/07/2024   Elevated brain natriuretic peptide (BNP) level 04/07/2024   COPD (chronic obstructive pulmonary disease) (HCC) 04/07/2024   Elevated troponin 04/07/2024   Prolonged QT interval 04/07/2024   Atrial fibrillation, chronic (HCC) 04/07/2024   Acquired hypothyroidism 04/07/2024   Encounter for monitoring dofetilide  therapy 02/25/2024   Chronic respiratory failure with hypoxia (HCC) 06/12/2023   Hypercoagulable state due to persistent atrial fibrillation (HCC) 01/02/2020   Chronic diastolic heart failure (HCC) 11/22/2019   Paroxysmal atrial fibrillation (HCC) 09/22/2019   Atrial fibrillation with RVR (HCC)    Lobar pneumonia 09/21/2019   Acute respiratory failure with hypoxia (HCC) 09/21/2019   Acute on chronic respiratory failure with hypoxia (HCC) 09/18/2019   Elevated lactic acid level    GERD (gastroesophageal reflux disease)    Chronic bronchitis (HCC) 02/09/2019   Sepsis due to pneumonia (HCC) 03/03/2018   Primary localized osteoarthritis of right knee 08/11/2017   Asthma with acute exacerbation 12/08/2016   Chest pain 10/20/2016   Essential hypertension 10/20/2016   Morbid obesity due to excess calories (HCC) 06/24/2016   Severe persistent asthma (HCC) 02/12/2016   Upper airway cough syndrome 01/24/2016   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   Persistent atrial fibrillation    Syncope    Hyperthyroidism    Hyperlipidemia  RBBB (right bundle branch block)    Tricuspid valve disorder    Aortic valve disorder    Carotid artery stenosis    Type 2 diabetes mellitus (HCC)    Pacemaker    OSA on CPAP    Arthritis    Sinoatrial node dysfunction (HCC)    PVC's (premature ventricular contractions) 07/17/2011   Essential hypertension, benign 01/24/2011   Premature ventricular contractions 01/24/2011   PPM-[place]ude 01/24/2011   Home Medication(s) Prior to Admission  medications   Medication Sig Start Date End Date Taking? Authorizing Provider  acetaminophen  (TYLENOL ) 500 MG tablet Take 1,000 mg by mouth 3 (three) times daily.    [provider]  albuterol  (PROVENTIL ) (2.5 MG/3ML) 0.083% nebulizer solution INHALE ONE VIAL VIA NEBULIZER EVERY 6 HOURS AS NEEDED FOR WHEEZING OR SHORTNESS OF BREATH 09/01/24   Tamea Dedra CROME, MD  albuterol  (VENTOLIN  HFA) 108 (90 Base) MCG/ACT inhaler INHALE 2 PUFFS INTO THE LUNGS EVERY 4 HOURS AS NEEDED, ONLY IF YOU CAN'T CATCH YOUR BREATH 06/02/24   Tamea Dedra CROME, MD  atorvastatin  (LIPITOR) 80 MG tablet Take 1 tablet (80 mg total) by mouth daily. 04/15/24   Rolan Ezra RAMAN, MD  bisoprolol  (ZEBETA ) 5 MG tablet TAKE 1 AND 1/2 TABLET BY MOUTH TWICE DAILY. 04/17/17   Dow Arland BROCKS, NP  BREZTRI  AEROSPHERE 160-9-4.8 MCG/ACT AERO inhaler INHALE 2 PUFFS INTO THE LUNGS TWICE DAILY. 09/26/24   Tamea Dedra CROME, MD  Carboxymethylcellul-Glycerin (LUBRICATING EYE DROPS OP) Place 1 drop into the right eye 2 (two) times a week. Clear eyes as needed Patient taking differently: Place 1 drop into the right eye once a week. Clear eyes as needed    [provider]  diltiazem  (CARDIZEM  CD) 120 MG 24 hr capsule TAKE (1) CAPSULE BY MOUTH DAILY, MAY TAKE A EXTRA CAPSULE DAILY FOR BREAKTHROUGH AFIB. 12/09/23   Waddell Danelle ORN, MD  dofetilide  (TIKOSYN ) 250 MCG capsule TAKE ONE CAPSULE BY MOUTH TWICE DAILY 07/27/24   Rolan Ezra RAMAN, MD  Dupilumab  (DUPIXENT ) 300 MG/2ML SOAJ Inject 300 mg into the skin every 14 (fourteen) days. 04/25/24   Tamea Dedra CROME, MD  Ensifentrine  (OHTUVAYRE ) 3 MG/2.5ML SUSP Take 3 mg by nebulization 2 (two) times daily. 06/13/24   Tamea Dedra CROME, MD  EPINEPHrine  0.3 mg/0.3 mL IJ SOAJ injection Inject 0.3 mg into the muscle as needed for anaphylaxis. 11/05/20   Tamea Dedra CROME, MD  ezetimibe  (ZETIA ) 10 MG tablet Take 1 tablet (10 mg total) by mouth daily. 06/20/24 09/27/24  Rolan Ezra RAMAN, MD   fluticasone  (FLONASE ) 50 MCG/ACT nasal spray Place 2 sprays into both nostrils daily. 10/21/23   Tobie Eldora NOVAK, MD  furosemide  (LASIX ) 40 MG tablet Take 1.5 tablets (60 mg total) by mouth 2 (two) times daily. 06/10/24   Rolan Ezra RAMAN, MD  glipiZIDE  (GLUCOTROL  XL) 5 MG 24 hr tablet Take 5 mg by mouth daily before breakfast.    [provider]  HYDROcodone -acetaminophen  (NORCO/VICODIN) 5-325 MG tablet Take 1 tablet by mouth every 6 (six) hours as needed. 09/26/24   [provider]  JARDIANCE  25 MG TABS tablet Take 25 mg by mouth daily before breakfast. 04/11/21   [provider]  LANTUS  100 UNIT/ML injection Inject 10 Units into the skin every morning. 09/20/24   [provider]  levothyroxine  (SYNTHROID ) 150 MCG tablet Take 150 mcg by mouth daily before breakfast. 06/01/23   [provider]  losartan  (COZAAR ) 50 MG tablet Take 50 mg by mouth at  bedtime. 04/27/20   [provider]  metFORMIN  (GLUCOPHAGE -XR) 500 MG 24 hr tablet Take 500 mg by mouth in the morning and at bedtime.    [provider]  Multiple Vitamin (MULTIVITAMIN) tablet Take 1 tablet by mouth daily.    [provider]  omeprazole  (PRILOSEC  OTC) 20 MG tablet Take 20 mg by mouth every morning.    [provider]  predniSONE  (DELTASONE ) 10 MG tablet Take 1 tablet (10 mg total) by mouth daily with breakfast. 08/31/24   Tamea Dedra CROME, MD  PRESCRIPTION MEDICATION 2 (two) times daily. Thumper vest Patient taking differently: 2 (two) times daily. Thumper vest- sometimes three times daily    [provider]  Respiratory Therapy Supplies (FLUTTER) DEVI Use as directed 10/10/19   Tamea Dedra CROME, MD  rivaroxaban  (XARELTO ) 20 MG TABS tablet Take 20 mg by mouth at bedtime.    [provider]  sodium chloride  (OCEAN) 0.65 % SOLN nasal spray Place 1 spray into both nostrils as needed for congestion.    [provider]  spironolactone   (ALDACTONE ) 25 MG tablet Take 0.5 tablets (12.5 mg total) by mouth daily. 06/10/24   Rolan Ezra RAMAN, MD                                                                                                                                    Past Surgical History Past Surgical History:  Procedure Laterality Date   A-V CARDIAC PACEMAKER INSERTION     Sick sinus syndrome DDR pacer   Arthropathy Right 2005   Rebuilding of left thumb and joint    CARDIAC CATHETERIZATION     CARDIAC ELECTROPHYSIOLOGY STUDY AND ABLATION  09/2008   for pvcs, Dr. Andreas   CARDIOVERSION N/A 12/18/2016   Procedure: CARDIOVERSION;  Surgeon: Vinie JAYSON Maxcy, MD;  Location: [facility] ENDOSCOPY;  Service: Cardiovascular;  Laterality: N/A;   CARDIOVERSION N/A 09/05/2021   Procedure: CARDIOVERSION;  Surgeon: Barbaraann Darryle Ned, MD;  Location: [facility] ENDOSCOPY;  Service: Cardiovascular;  Laterality: N/A;   CARDIOVERSION N/A 08/06/2022   Procedure: CARDIOVERSION;  Surgeon: Kate Lonni CROME, MD;  Location: [facility] ENDOSCOPY;  Service: Cardiovascular;  Laterality: N/A;   CARPAL TUNNEL RELEASE  1994   right wrist   CARPAL TUNNEL RELEASE  05/04/2012   Procedure: CARPAL TUNNEL RELEASE;  Surgeon: Arley JONELLE Curia, MD;  Location: [facility];  Service: Orthopedics;  Laterality: Left;   CARPOMETACARPEL SUSPENSION PLASTY Right 11/16/2014   Procedure: SUSPENSIONPLASTY RIGHT THUMB TENDON TRANSFER ABDUCTOR POLLICUS LONGUS EXCISION TRAPEZIUM;  Surgeon: Arley Curia, MD;  Location: [facility];  Service: Orthopedics;  Laterality: Right;   CHOLECYSTECTOMY  1994   CIRCUMCISION     COLONOSCOPY N/A 03/14/2013   Procedure: COLONOSCOPY;  Surgeon: Lamar CHRISTELLA Hollingshead, MD;  Location: [facility];  Service: Endoscopy;  Laterality: N/A;  8:15 AM   EYE SURGERY     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[facility]   EYE SURGERY  2012   Left eye Corneal transplant- partial- Cataract   FLEXIBLE BRONCHOSCOPY Right 06/23/2019   Procedure: FLEXIBLE  BRONCHOSCOPY RIGHT;  Surgeon: Verdia Art, MD;  Location: [facility] ORS;  Service: Pulmonary;  Laterality: Right;   GALLBLADDER SURGERY  12/01/2006   HAND TENDON SURGERY Left late 1990's   thumb   HEMORROIDECTOMY  2003   INJECTION KNEE Left 08/26/2017   Procedure: LEFT KNEE INJECTION;  Surgeon: Jane Charleston, MD;  Location: [facility] OR;  Service: Orthopedics;  Laterality: Left;   left Knee Arthroscopy     [facility]   PARTIAL KNEE ARTHROPLASTY  11/22/2012   Procedure: UNICOMPARTMENTAL KNEE;  Surgeon: Charleston DELENA Jane, MD;  Location: [facility] OR;  Service: Orthopedics;  Laterality: Left;  left unicompartmental knee arthroplasty   PERMANENT PACEMAKER GENERATOR CHANGE N/A 01/12/2013   Procedure: PERMANENT PACEMAKER GENERATOR CHANGE;  Surgeon: Danelle LELON Birmingham, MD;  Location: [facility] CATH LAB;  Service: Cardiovascular;  Laterality: N/A;   PPM GENERATOR CHANGEOUT N/A 07/07/2022   Procedure: PPM GENERATOR CHANGEOUT;  Surgeon: Birmingham Danelle LELON, MD;  Location: [facility] INVASIVE CV LAB;  Service: Cardiovascular;  Laterality: N/A;   RIGHT/LEFT HEART CATH AND CORONARY ANGIOGRAPHY N/A 08/28/2023   Procedure: RIGHT/LEFT HEART CATH AND CORONARY ANGIOGRAPHY;  Surgeon: Rolan Ezra RAMAN, MD;  Location: [facility] INVASIVE CV LAB;  Service: Cardiovascular;  Laterality: N/A;   Rotator cuff Surgery  2001   Right shoulder   TONSILLECTOMY     TOTAL KNEE ARTHROPLASTY Right 08/26/2017   Procedure: TOTAL KNEE ARTHROPLASTY;  Surgeon: Jane Charleston, MD;  Location: [facility] OR;  Service: Orthopedics;  Laterality: Right;   varicose vein reduction     Family History Family History  Problem Relation Age of Onset   Liver cancer Mother    Pancreatic cancer Mother    Colon cancer Mother    Hypertension Father    Stroke Father    Other Father 39       Sudden Cardiac death   Heart attack Father    Cancer Sister        brain   Diabetes Sister    Colon cancer Maternal Aunt    Colon polyps Neg Hx     Social History Social  History   Tobacco Use   Smoking status: Former    Current packs/day: 0.00    Average packs/day: 3.0 packs/day for 26.0 years (77.9 ttl pk-yrs)    Types: Cigarettes    Start date: 02/20/1958    Quit date: 02/12/1984    Years since quitting: 40.6   Smokeless tobacco: Former    Types: Chew   Tobacco comments:    Former smoker 08/30/2021  Vaping Use   Vaping status: Never Used  Substance Use Topics   Alcohol  use: Yes    Alcohol /week: 2.0 - 4.0 standard drinks of alcohol     Types: 1 - 2 Glasses of wine, 1 - 2 Cans of beer per week    Comment: 1 glass of wine or beer 3 times a week 12/26/22   Drug use: No   Allergies Food, Iodinated contrast media, Shellfish allergy , Goat-derived products, Prednisone , Diclofenac sodium, Metformin  and related, Vancomycin , and Voltaren [diclofenac sodium]  Review of Systems A thorough review of systems was obtained and all systems are negative except as noted in the HPI and PMH.   Physical Exam Vital Signs  I have reviewed the triage vital signs BP (!) 90/54   Pulse 75   Temp (!)  100.6 F (38.1 C) (Axillary)   Resp 19   Ht 5' 9 (1.753 m)   Wt 96 kg   SpO2 96%   BMI 31.25 kg/m  Physical Exam Vitals and nursing note reviewed.  Constitutional:      General: He is in acute distress.     Appearance: He is well-developed. He is ill-appearing.     Comments: cyanotic  HENT:     Head: Normocephalic and atraumatic.     Right Ear: External ear normal.     Left Ear: External ear normal.     Mouth/Throat:     Mouth: Mucous membranes are moist.  Eyes:     General: No scleral icterus. Cardiovascular:     Rate and Rhythm: Normal rate and regular rhythm.     Pulses: Normal pulses.     Heart sounds: Normal heart sounds.  Pulmonary:     Effort: Tachypnea, accessory muscle usage and respiratory distress present.     Breath sounds: Wheezing and rales present.  Abdominal:     General: Abdomen is flat.     Palpations: Abdomen is soft.      Tenderness: There is no abdominal tenderness.  Musculoskeletal:     Cervical back: No rigidity.     Right lower leg: Edema present.     Left lower leg: Edema present.  Skin:    General: Skin is warm and dry.     Capillary Refill: Capillary refill takes less than 2 seconds.  Neurological:     Mental Status: He is alert.  Psychiatric:        Mood and Affect: Mood normal.        Behavior: Behavior normal.     ED Results and Treatments Labs (all labs ordered are listed, but only abnormal results are displayed) Labs Reviewed  BASIC METABOLIC PANEL WITH GFR - Abnormal; Notable for the following components:      Result Value   Potassium 5.9 (*)    Chloride 96 (*)    CO2 17 (*)    Glucose, Bld 295 (*)    BUN 38 (*)    Creatinine, Ser 2.08 (*)    GFR, Estimated 32 (*)    Anion gap 27 (*)    All other components within normal limits  PRO BRAIN NATRIURETIC PEPTIDE - Abnormal; Notable for the following components:   Pro Brain Natriuretic Peptide 15,139.0 (*)    All other components within normal limits  CBC WITH DIFFERENTIAL/PLATELET - Abnormal; Notable for the following components:   WBC 16.8 (*)    Neutro Abs 13.1 (*)    Monocytes Absolute 1.2 (*)    All other components within normal limits  I-STAT CHEM 8, ED - Abnormal; Notable for the following components:   Potassium 6.0 (*)    BUN 47 (*)    Creatinine, Ser 2.10 (*)    Glucose, Bld 286 (*)    Calcium , Ion 1.06 (*)    All other components within normal limits  TROPONIN T, HIGH SENSITIVITY - Abnormal; Notable for the following components:   Troponin T High Sensitivity 71 (*)    All other components within normal limits  RESP PANEL BY RT-PCR (RSV, FLU A&B, COVID)  RVPGX2  CULTURE, BLOOD (ROUTINE X 2)  CULTURE, BLOOD (ROUTINE X 2)  LACTIC ACID, PLASMA  LACTIC ACID, PLASMA  BLOOD GAS, VENOUS  URINALYSIS, ROUTINE W REFLEX MICROSCOPIC  TROPONIN T, HIGH SENSITIVITY  Radiology DG Chest Port 1 View Result Date: 09/30/2024 EXAM: 1 VIEW XRAY OF THE CHEST 09/30/2024 09:18:05 PM COMPARISON: 04/06/2024 CLINICAL HISTORY: Dib. Pt bib EMS for respiratory distress. EMS reports called to house for SOB that had gotten worse throughout day. EMS reports pt was on O2 @ 2L nasal cannula (normally wears O2 @ 4.5L) upon their arrival and sats were reading 62%. EMS placed pt on Cpap and ; reported sats of 75-78%. FINDINGS: LUNGS AND PLEURA: Hypoinflated lungs with retrocardiac opacification likely related to at least partial left lower lobe collapse. Emphysema. Unchanged bibasilar linear opacities. No pleural effusion. No pneumothorax. HEART AND MEDIASTINUM: Stable left chest wall dual lead pacemaker. No acute abnormality of the cardiac and mediastinal silhouettes. BONES AND SOFT TISSUES: No acute osseous abnormality. IMPRESSION: 1. Hypoinflated lungs with retrocardiac opacification likely related to at least partial left lower lobe collapse. 2. Emphysema. Electronically signed by: Dorethia Molt MD 09/30/2024 09:22 PM EDT RP Workstation: [id number]    Pertinent labs & imaging results that were available during my care of the patient were reviewed by me and considered in my medical decision making (see MDM for details).  Medications Ordered in ED Medications  magnesium  sulfate 1-5 GM/100ML-% IVPB (  Not Given 09/30/24 2039)  doxycycline  (VIBRAMYCIN ) 100 mg in sodium chloride  0.9 % 250 mL IVPB (100 mg Intravenous New Bag/Given 09/30/24 2155)  sodium zirconium cyclosilicate (LOKELMA) packet 10 g (has no administration in time range)  methylPREDNISolone  sodium succinate (SOLU-MEDROL ) 125 mg/2 mL injection 125 mg (125 mg Intravenous Given 09/30/24 2038)  magnesium  sulfate IVPB 2 g 50 mL (2 g Intravenous New Bag/Given 09/30/24 2039)  albuterol  (PROVENTIL ) (2.5 MG/3ML) 0.083% nebulizer solution 10 mg (10 mg Nebulization Given  09/30/24 2043)  ipratropium (ATROVENT ) nebulizer solution 2 mg (2 mg Nebulization Given 09/30/24 2044)  cefTRIAXone  (ROCEPHIN ) 1 g in sodium chloride  0.9 % 100 mL IVPB (0 g Intravenous Stopped 09/30/24 2154)  furosemide  (LASIX ) injection 40 mg (40 mg Intravenous Given 09/30/24 2135)                                                                                                                                     Procedures Procedures  (including critical care time)  Medical Decision Making / ED Course    Medical Decision Making:    CAID RADIN is a 78 y.o. male with past medical history as below, significant for CHF, aortic valve disorder, COPD, HTN, HLD, T2DM, afib who presents to the ED with complaint of resp distress. The complaint involves an extensive differential diagnosis and also carries with it a high risk of complications and morbidity.  Serious etiology was considered. Ddx includes but is not limited to: In my evaluation of this patient's dyspnea my DDx includes, but is not limited to, pneumonia, pulmonary embolism, pneumothorax, pulmonary edema, metabolic acidosis, asthma, COPD, cardiac cause, anemia, anxiety, etc.    Complete initial physical exam  performed, notably the patient was in acute respiratory stress, cyanotic.    Reviewed and confirmed nursing documentation for past medical history, family history, social history.  Vital signs reviewed.    Acute hypoxic respiratory failure Acute CHF CAP> - pt arrives in extremis, resp distress, cyanotic on CPAP - Transition to BiPAP with precipitous improvement.  Given nebulized breathing treatments, Solu-Medrol , mag sulfate with improvement. -BNP is 15,000, he has lower extremity edema, troponin is elevated, hx CHF, start diuresis - CXR w/ retrocardiac opacity, he has temp 100.6, WBC 16.8, tachypnea, meets for sepsis, presumed PNA, start rocephin /doxy (prolonged qtc) - trop elev, no chest pain, EKG is non-ischemic, favor demand  ischemia in setting of resp fail - admit for sepsis 2/2 PNA, CHF, resp distress on bipap, pt agreeable   AKI> - Cr 2.08 BUN 38, baseline around 1.1, K 5.9 - give lokelma - pt appears to have acute CHF, judicious fluids recommended       Admit [facility]               Additional history obtained: -Additional history obtained from ems -External records from outside source obtained and reviewed including: Chart review including previous notes, labs, imaging, consultation notes including  Home medications, primary care documentation   Lab Tests: -I ordered, reviewed, and interpreted labs.   The pertinent results include:   Labs Reviewed  BASIC METABOLIC PANEL WITH GFR - Abnormal; Notable for the following components:      Result Value   Potassium 5.9 (*)    Chloride 96 (*)    CO2 17 (*)    Glucose, Bld 295 (*)    BUN 38 (*)    Creatinine, Ser 2.08 (*)    GFR, Estimated 32 (*)    Anion gap 27 (*)    All other components within normal limits  PRO BRAIN NATRIURETIC PEPTIDE - Abnormal; Notable for the following components:   Pro Brain Natriuretic Peptide 15,139.0 (*)    All other components within normal limits  CBC WITH DIFFERENTIAL/PLATELET - Abnormal; Notable for the following components:   WBC 16.8 (*)    Neutro Abs 13.1 (*)    Monocytes Absolute 1.2 (*)    All other components within normal limits  I-STAT CHEM 8, ED - Abnormal; Notable for the following components:   Potassium 6.0 (*)    BUN 47 (*)    Creatinine, Ser 2.10 (*)    Glucose, Bld 286 (*)    Calcium , Ion 1.06 (*)    All other components within normal limits  TROPONIN T, HIGH SENSITIVITY - Abnormal; Notable for the following components:   Troponin T High Sensitivity 71 (*)    All other components within normal limits  RESP PANEL BY RT-PCR (RSV, FLU A&B, COVID)  RVPGX2  CULTURE, BLOOD (ROUTINE X 2)  CULTURE, BLOOD (ROUTINE X 2)  LACTIC ACID, PLASMA  LACTIC ACID, PLASMA  BLOOD GAS, VENOUS   URINALYSIS, ROUTINE W REFLEX MICROSCOPIC  TROPONIN T, HIGH SENSITIVITY    Notable for as above  EKG   EKG Interpretation Date/Time:  Friday September 30 2024 20:31:48 EDT Ventricular Rate:  75 PR Interval:  69 QRS Duration:  143 QT Interval:  466 QTC Calculation: 478 R Axis:   104  Text Interpretation: Sinus rhythm Short PR interval Right bundle branch block Confirmed by Elnor Savant ([id number]) on 09/30/2024 8:36:14 PM         Imaging Studies ordered: I ordered imaging studies including CXR I independently visualized the following  imaging with scope of interpretation limited to determining acute life threatening conditions related to emergency care; findings noted above I agree with the radiologist interpretation If any imaging was obtained with contrast I closely monitored patient for any possible adverse reaction a/w contrast administration in the emergency department   Medicines ordered and prescription drug management: Meds ordered this encounter  Medications   methylPREDNISolone  sodium succinate (SOLU-MEDROL ) 125 mg/2 mL injection 125 mg   magnesium  sulfate IVPB 2 g 50 mL   albuterol  (PROVENTIL ) (2.5 MG/3ML) 0.083% nebulizer solution 10 mg   ipratropium (ATROVENT ) nebulizer solution 2 mg   ipratropium (ATROVENT ) 0.02 % nebulizer solution    Woody, Abby D: cabinet override   albuterol  (PROVENTIL ) (2.5 MG/3ML) 0.083% nebulizer solution    Woody, Abby D: cabinet override   methylPREDNISolone  sodium succinate (SOLU-MEDROL ) 125 mg/2 mL injection    Woody, Abby D: cabinet override   magnesium  sulfate 1-5 GM/100ML-% IVPB    Woody, Abby D: cabinet override   cefTRIAXone  (ROCEPHIN ) 1 g in sodium chloride  0.9 % 100 mL IVPB    Antibiotic Indication::   CAP   doxycycline  (VIBRAMYCIN ) 100 mg in sodium chloride  0.9 % 250 mL IVPB    Antibiotic Indication::   CAP   furosemide  (LASIX ) injection 40 mg   sodium zirconium cyclosilicate (LOKELMA) packet 10 g    -I have reviewed the  patients home medicines and have made adjustments as needed   Consultations Obtained: I requested consultation with the [facility],  and discussed lab and imaging findings as well as pertinent plan    Cardiac Monitoring: The patient was maintained on a cardiac monitor.  I personally viewed and interpreted the cardiac monitored which showed an underlying rhythm of: sinus tachy Continuous pulse oximetry interpreted by myself, 99% on BIPAP.    Social Determinants of Health:  Diagnosis or treatment significantly limited by social determinants of health: former smoker   Reevaluation: After the interventions noted above, I reevaluated the patient and found that they have improved  Co morbidities that complicate the patient evaluation  Past Medical History:  Diagnosis Date   Allergic rhinitis    Aortic valve disorder    Asthma    since childhood- seasonal allergies induced   Cancer (HCC)    Skin cancer- squamous, basal   Carotid artery stenosis    CHF (congestive heart failure) (HCC)    COPD (chronic obstructive pulmonary disease) (HCC)    Essential hypertension    Full dentures    GERD (gastroesophageal reflux disease)    H/O hiatal hernia    Hemorrhage of rectum    Hyperlipidemia    Hypothyroidism    Male circumcision    OSA (obstructive sleep apnea)    Osteoarthritis    Pacemaker    Oct 2005 in [place].   PAF (paroxysmal atrial fibrillation) (HCC)    Pneumonia    several Times 2015 last time   Primary localized osteoarthritis of right knee 08/11/2017   RBBB (right bundle branch block)    Sinoatrial node dysfunction (HCC)    Syncope    Tricuspid valve disorder    Type 2 diabetes mellitus (HCC)    Type II      Dispostion: Disposition decision including need for hospitalization was considered, and patient admitted to the hospital.    Final Clinical Impression(s) / ED Diagnoses Final diagnoses:  Acute respiratory failure with hypoxia (HCC)  Acute congestive heart  failure, unspecified heart failure type (HCC)  Community acquired pneumonia, unspecified laterality  Sepsis,  due to unspecified organism, unspecified whether acute organ dysfunction present [facility])  AKI (acute kidney injury)        Elnor Jayson LABOR, DO 09/30/24 2206

## 2024-09-30 NOTE — Sepsis Progress Note (Signed)
 Following for sepsis monitoring ?

## 2024-09-30 NOTE — ED Notes (Signed)
 Report given to ICU RN

## 2024-10-01 ENCOUNTER — Other Ambulatory Visit: Payer: Self-pay

## 2024-10-01 ENCOUNTER — Inpatient Hospital Stay (HOSPITAL_COMMUNITY)

## 2024-10-01 DIAGNOSIS — E1165 Type 2 diabetes mellitus with hyperglycemia: Secondary | ICD-10-CM | POA: Insufficient documentation

## 2024-10-01 DIAGNOSIS — N179 Acute kidney failure, unspecified: Secondary | ICD-10-CM | POA: Insufficient documentation

## 2024-10-01 DIAGNOSIS — I5031 Acute diastolic (congestive) heart failure: Secondary | ICD-10-CM

## 2024-10-01 DIAGNOSIS — E875 Hyperkalemia: Secondary | ICD-10-CM | POA: Insufficient documentation

## 2024-10-01 DIAGNOSIS — E66811 Obesity, class 1: Secondary | ICD-10-CM | POA: Insufficient documentation

## 2024-10-01 LAB — BASIC METABOLIC PANEL WITH GFR
Anion gap: 19 — ABNORMAL HIGH (ref 5–15)
BUN: 39 mg/dL — ABNORMAL HIGH (ref 8–23)
CO2: 18 mmol/L — ABNORMAL LOW (ref 22–32)
Calcium: 8.7 mg/dL — ABNORMAL LOW (ref 8.9–10.3)
Chloride: 97 mmol/L — ABNORMAL LOW (ref 98–111)
Creatinine, Ser: 1.96 mg/dL — ABNORMAL HIGH (ref 0.61–1.24)
GFR, Estimated: 34 mL/min — ABNORMAL LOW (ref >60)
Glucose, Bld: 400 mg/dL — ABNORMAL HIGH (ref 70–99)
Potassium: 4.7 mmol/L (ref 3.5–5.1)
Sodium: 134 mmol/L — ABNORMAL LOW (ref 135–145)

## 2024-10-01 LAB — COMPREHENSIVE METABOLIC PANEL WITH GFR
ALT: 77 U/L — ABNORMAL HIGH (ref 0–44)
AST: 79 U/L — ABNORMAL HIGH (ref 15–41)
Albumin: 3.8 g/dL (ref 3.5–5.0)
Alkaline Phosphatase: 78 U/L (ref 38–126)
Anion gap: 19 — ABNORMAL HIGH (ref 5–15)
BUN: 38 mg/dL — ABNORMAL HIGH (ref 8–23)
CO2: 23 mmol/L (ref 22–32)
Calcium: 9.1 mg/dL (ref 8.9–10.3)
Chloride: 98 mmol/L (ref 98–111)
Creatinine, Ser: 1.99 mg/dL — ABNORMAL HIGH (ref 0.61–1.24)
GFR, Estimated: 34 mL/min — ABNORMAL LOW (ref >60)
Glucose, Bld: 335 mg/dL — ABNORMAL HIGH (ref 70–99)
Potassium: 4.5 mmol/L (ref 3.5–5.1)
Sodium: 140 mmol/L (ref 135–145)
Total Bilirubin: 0.7 mg/dL (ref 0.0–1.2)
Total Protein: 6.9 g/dL (ref 6.5–8.1)

## 2024-10-01 LAB — GLUCOSE, CAPILLARY
Glucose-Capillary: 240 mg/dL — ABNORMAL HIGH (ref 70–99)
Glucose-Capillary: 254 mg/dL — ABNORMAL HIGH (ref 70–99)
Glucose-Capillary: 271 mg/dL — ABNORMAL HIGH (ref 70–99)
Glucose-Capillary: 279 mg/dL — ABNORMAL HIGH (ref 70–99)
Glucose-Capillary: 365 mg/dL — ABNORMAL HIGH (ref 70–99)

## 2024-10-01 LAB — URINALYSIS, ROUTINE W REFLEX MICROSCOPIC
Bacteria, UA: NONE SEEN
Bilirubin Urine: NEGATIVE
Glucose, UA: >=500 mg/dL — AB
Hgb urine dipstick: NEGATIVE
Ketones, ur: NEGATIVE mg/dL
Leukocytes,Ua: NEGATIVE
Nitrite: NEGATIVE
Protein, ur: 30 mg/dL — AB
Specific Gravity, Urine: 1.016 (ref 1.005–1.030)
pH: 5 (ref 5.0–8.0)

## 2024-10-01 LAB — LACTIC ACID, PLASMA
Lactic Acid, Venous: 4.3 mmol/L (ref 0.5–1.9)
Lactic Acid, Venous: 4.8 mmol/L (ref 0.5–1.9)

## 2024-10-01 LAB — MRSA NEXT GEN BY PCR, NASAL: MRSA by PCR Next Gen: NOT DETECTED

## 2024-10-01 LAB — ECHOCARDIOGRAM COMPLETE
AR max vel: 1.08 cm2
AV Area VTI: 1.03 cm2
AV Area mean vel: 0.97 cm2
AV Mean grad: 14 mmHg
AV Peak grad: 23.9 mmHg
Ao pk vel: 2.45 m/s
Area-P 1/2: 3.31 cm2
Height: 68 in
S' Lateral: 4.2 cm
Weight: 3333.36 [oz_av]

## 2024-10-01 LAB — CBC
HCT: 46.9 % (ref 39.0–52.0)
Hemoglobin: 15.4 g/dL (ref 13.0–17.0)
MCH: 30.7 pg (ref 26.0–34.0)
MCHC: 32.8 g/dL (ref 30.0–36.0)
MCV: 93.4 fL (ref 80.0–100.0)
Platelets: 183 K/uL (ref 150–400)
RBC: 5.02 MIL/uL (ref 4.22–5.81)
RDW: 15.3 % (ref 11.5–15.5)
WBC: 13.5 K/uL — ABNORMAL HIGH (ref 4.0–10.5)
nRBC: 0 % (ref 0.0–0.2)

## 2024-10-01 LAB — PHOSPHORUS: Phosphorus: 3.6 mg/dL (ref 2.5–4.6)

## 2024-10-01 LAB — PROCALCITONIN: Procalcitonin: 2.29 ng/mL

## 2024-10-01 LAB — HEMOGLOBIN A1C
Hgb A1c MFr Bld: 8.3 % — ABNORMAL HIGH (ref 4.8–5.6)
Mean Plasma Glucose: 191.51 mg/dL

## 2024-10-01 LAB — STREP PNEUMONIAE URINARY ANTIGEN: Strep Pneumo Urinary Antigen: NEGATIVE

## 2024-10-01 LAB — MAGNESIUM: Magnesium: 2.7 mg/dL — ABNORMAL HIGH (ref 1.7–2.4)

## 2024-10-01 MED ORDER — FUROSEMIDE 10 MG/ML IJ SOLN
40.0000 mg | Freq: Two times a day (BID) | INTRAMUSCULAR | Status: DC
  Administered 2024-10-01: 40 mg via INTRAVENOUS
  Filled 2024-10-01: qty 4

## 2024-10-01 MED ORDER — FUROSEMIDE 40 MG PO TABS
40.0000 mg | ORAL_TABLET | Freq: Every day | ORAL | Status: DC
  Administered 2024-10-02 – 2024-10-03 (×2): 40 mg via ORAL
  Filled 2024-10-01 (×2): qty 1

## 2024-10-01 MED ORDER — INSULIN ASPART 100 UNIT/ML IJ SOLN
0.0000 [IU] | Freq: Three times a day (TID) | INTRAMUSCULAR | Status: DC
  Administered 2024-10-01 – 2024-10-02 (×6): 5 [IU] via SUBCUTANEOUS
  Administered 2024-10-03 (×2): 7 [IU] via SUBCUTANEOUS
  Administered 2024-10-03: 3 [IU] via SUBCUTANEOUS
  Administered 2024-10-04: 7 [IU] via SUBCUTANEOUS
  Administered 2024-10-04: 5 [IU] via SUBCUTANEOUS

## 2024-10-01 MED ORDER — IPRATROPIUM-ALBUTEROL 0.5-2.5 (3) MG/3ML IN SOLN: 3.0000 mL | RESPIRATORY_TRACT | Status: DC | PRN

## 2024-10-01 MED ORDER — EMPAGLIFLOZIN 25 MG PO TABS
25.0000 mg | ORAL_TABLET | Freq: Every day | ORAL | Status: DC
  Administered 2024-10-01 – 2024-10-04 (×4): 25 mg via ORAL
  Filled 2024-10-01 (×5): qty 1

## 2024-10-01 MED ORDER — DOFETILIDE 250 MCG PO CAPS
250.0000 ug | ORAL_CAPSULE | Freq: Two times a day (BID) | ORAL | Status: DC
  Filled 2024-10-01 (×3): qty 1

## 2024-10-01 MED ORDER — INSULIN GLARGINE-YFGN 100 UNIT/ML ~~LOC~~ SOLN
10.0000 [IU] | Freq: Every day | SUBCUTANEOUS | Status: DC
Start: 2024-10-01 — End: 2024-10-01

## 2024-10-01 MED ORDER — INSULIN GLARGINE 100 UNIT/ML ~~LOC~~ SOLN
10.0000 [IU] | Freq: Every day | SUBCUTANEOUS | Status: DC
  Administered 2024-10-01 (×2): 10 [IU] via SUBCUTANEOUS
  Filled 2024-10-01 (×3): qty 0.1

## 2024-10-01 MED ORDER — PERFLUTREN LIPID MICROSPHERE
1.0000 mL | INTRAVENOUS | Status: AC | PRN
  Administered 2024-10-01: 7 mL via INTRAVENOUS

## 2024-10-01 MED ORDER — INSULIN ASPART 100 UNIT/ML IJ SOLN
0.0000 [IU] | Freq: Every day | INTRAMUSCULAR | Status: DC
  Administered 2024-10-01: 3 [IU] via SUBCUTANEOUS
  Administered 2024-10-01: 5 [IU] via SUBCUTANEOUS
  Administered 2024-10-02: 3 [IU] via SUBCUTANEOUS
  Administered 2024-10-03: 5 [IU] via SUBCUTANEOUS

## 2024-10-01 MED ORDER — RIVAROXABAN 15 MG PO TABS
15.0000 mg | ORAL_TABLET | Freq: Every day | ORAL | Status: DC
  Administered 2024-10-01 – 2024-10-02 (×2): 15 mg via ORAL
  Filled 2024-10-01 (×2): qty 1

## 2024-10-01 MED ORDER — METHYLPREDNISOLONE SODIUM SUCC 40 MG IJ SOLR
40.0000 mg | Freq: Two times a day (BID) | INTRAMUSCULAR | Status: DC
  Administered 2024-10-01 – 2024-10-02 (×3): 40 mg via INTRAVENOUS
  Filled 2024-10-01 (×3): qty 1

## 2024-10-01 NOTE — Progress Notes (Signed)
 PROGRESS NOTE  Clarence Dawson, is a 78 y.o. male, DOB - Aug 03, 1946, FMW:[id number]  Admit date - 09/30/2024   Admitting Physician Posey Maier, DO  Outpatient Primary MD for the patient is Sheryle Carwin, MD  LOS - 1  Chief Complaint  Patient presents with   Respiratory Distress       Brief Narrative:   78 y.o. male with medical history significant of persistent atrial fibrillation, COPD/emphysema, bronchiectasis, Pulmonary hypertension, HLD, sinus node dysfunction s/p PPM, chronic respiratory failure with hypoxia on supplemental oxygen  via [place] at 4.5 LPM at home, obstructive sleep apnea on CPAP at home admitted on 23 2025 with acute on chronic hypoxic respiratory failure due to pneumonia    -Assessment and Plan: 1)Acute on chronic hypoxic respiratory failure due to pneumonia - At home uses 4 to 4-1/2 L of oxygen  via nasal cannula -On admission O2 sat was in the 70s despite oxygen  supplementation patient required BiPAP -Rocephin /doxycycline , bronchodilators and mucolytics for pneumonia  Lactic acidosis possibly due to hypoxia in the setting of pneumonia and COPD flare Lactic acid 7.6 >>> 5.1 >>4.8    2)Sepsis due to  CAP-- POA WBC 16.8 >>13.5, PCT 2.29   -Blood cultures pending --Rocephin /doxycycline , bronchodilators and mucolytics for pneumonia Continue Mucinex , incentive spirometry, flutter valve    3)Acute exacerbation of COPD--due to pneumonia -Management as above in #1 and #2 -iv Solu-Medrol  as ordered   4)HFpEF--elevated proBNP and troponin noted proBNP 15,139, chest x-ray without CHF type findings Troponin 71 >>> 70, troponin already flattened.  Patient denies chest pains -No acute EKG changes -Patient received IV Lasix  -Okay to transition to oral Lasix - -he was on Lasix  prior to admission - Echo from 10/01/2024 with EF of 55 to 60% diastolic parameters indeterminate Echocardiogram done on 04/07/2024 showed LVEF 45 to 70%.  No WMA.  G2 DD   5)Partial left lower lobe  collapse Chest x-ray showed partial left lower lobe collapse.  Continue incentive parameter   Hyperkalemia Potassium 5.9 >> 6.0 >>4.5 - Potassium normalized after hyperkalemia cocktail No acute EKG changes   DM2-A1c 8.3 reflecting uncontrolled DM with hyperglycemia PTA -Continue Jardiance , Lantus  -Use Novolog /Humalog Sliding scale insulin  with Accu-Cheks/Fingersticks as ordered    Acute kidney injury Creatinine 2.03 >>1.99 -(baseline creatinine at 1.0-1.2) Renally adjust medications, avoid nephrotoxic agents / dehydration /hypotension   Mixed hyperlipidemia Continue Lipitor, Zetia    Acquired hypothyroidism Continue Synthroid    Persistent atrial fibrillation Continue Xarelto   Zebeta  to be held at this time due to soft BP -Per cardiologist Stop Tikosyn /dofetilide  due to prolonged QTc and AKI and do NOT restart at discharge -Patient will need to follow-up with A-fib clinic to discuss treatment options for A-fib - GERD Continue Protonix    Obstructive sleep apnea Continue BiPAP   Class 1 Obesity- -Low calorie diet, portion control and increase physical activity discussed with patient -Body mass index is 31.68 kg/m.  Status is: Inpatient   Disposition: The patient is from: Home              Anticipated d/c is to: Home              Anticipated d/c date is: 2 days              Patient currently is not medically stable to d/c. Barriers: Not Clinically Stable-   Code Status :  -  Code Status: Full Code   Family Communication:    NA (patient is alert, awake and coherent)   DVT Prophylaxis  :   -  SCDs  SCDs Start: 09/30/24 2340 Rivaroxaban  (XARELTO ) tablet 15 mg   Lab Results  Component Value Date   PLT 183 10/01/2024    Inpatient Medications  Scheduled Meds:  atorvastatin   80 mg Oral Daily   Chlorhexidine  Gluconate Cloth  6 each Topical Daily   dextromethorphan -guaiFENesin   1 tablet Oral BID   empagliflozin   25 mg Oral QAC breakfast   ezetimibe   10 mg Oral  Daily   furosemide   40 mg Intravenous BID   insulin  aspart  0-5 Units Subcutaneous QHS   insulin  aspart  0-9 Units Subcutaneous TID WC   insulin  glargine  10 Units Subcutaneous QHS   levothyroxine   150 mcg Oral QAC breakfast   methylPREDNISolone  (SOLU-MEDROL ) injection  40 mg Intravenous Q12H   pantoprazole   40 mg Oral Daily   rivaroxaban   15 mg Oral Q supper   Continuous Infusions:  cefTRIAXone  (ROCEPHIN )  IV     doxycycline  (VIBRAMYCIN ) IV 100 mg (10/01/24 0927)   PRN Meds:.acetaminophen  **OR** acetaminophen , ipratropium-albuterol , prochlorperazine   Anti-infectives (From admission, onward)    Start     Dose/Rate Route Frequency Ordered Stop   10/01/24 2200  cefTRIAXone  (ROCEPHIN ) 2 g in sodium chloride  0.9 % 100 mL IVPB        2 g 200 mL/hr over 30 Minutes Intravenous Every 24 hours 09/30/24 2344 10/05/24 2159   10/01/24 1000  doxycycline  (VIBRAMYCIN ) 100 mg in sodium chloride  0.9 % 250 mL IVPB        100 mg 125 mL/hr over 120 Minutes Intravenous Every 12 hours 09/30/24 2358     09/30/24 2200  doxycycline  (VIBRAMYCIN ) 100 mg in sodium chloride  0.9 % 250 mL IVPB        100 mg 125 mL/hr over 120 Minutes Intravenous  Once 09/30/24 2130 09/30/24 2355   09/30/24 2145  cefTRIAXone  (ROCEPHIN ) 1 g in sodium chloride  0.9 % 100 mL IVPB        1 g 200 mL/hr over 30 Minutes Intravenous  Once 09/30/24 2130 09/30/24 2154         Subjective: Clarence Dawson today has no fevers, no emesis,  No chest pain,    - Cough and dyspnea improving   Objective: Vitals:   10/01/24 0900 10/01/24 1100 10/01/24 1152 10/01/24 1300  BP: 127/68 (!) 99/55  (!) 105/53  Pulse: 73 68  69  Resp: 17 11  17   Temp:   99.1 F (37.3 C)   TempSrc:   Oral   SpO2: 100% 100%  97%  Weight:      Height:        Intake/Output Summary (Last 24 hours) at 10/01/2024 1615 Last data filed at 10/01/2024 1159 Gross per 24 hour  Intake 150 ml  Output 1450 ml  Net -1300 ml   Filed Weights   09/30/24 2031 09/30/24  2333 10/01/24 0500  Weight: 96 kg 94.5 kg 94.5 kg    Physical Exam Gen:- Awake Alert,  in no apparent distress  HEENT:- [place].AT, No sclera icterus Nose- [place] 5L/min Neck-Supple Neck,No JVD,.  Lungs-fair air movement, no wheezing  CV- S1, S2 normal, regular , PPM in situ Abd-  +ve B.Sounds, Abd Soft, No tenderness,    Extremity/Skin:- No  edema, pedal pulses present  Psych-affect is appropriate, oriented x3 Neuro-no new focal deficits,?? Mild tremors earlier  Data Reviewed: I have personally reviewed following labs and imaging studies  CBC: Recent Labs  Lab 09/30/24 2031 09/30/24 2105 10/01/24 0325  WBC 16.8*  --  13.5*  NEUTROABS 13.1*  --   --   HGB 15.7 15.3 15.4  HCT 48.9 45.0 46.9  MCV 93.9  --  93.4  PLT 249  --  183   Basic Metabolic Panel: Recent Labs  Lab 09/30/24 2031 09/30/24 2105 09/30/24 2340 10/01/24 0325  NA 139 135 134* 140  K 5.9* 6.0* 4.7 4.5  CL 96* 100 97* 98  CO2 17*  --  18* 23  GLUCOSE 295* 286* 400* 335*  BUN 38* 47* 39* 38*  CREATININE 2.08* 2.10* 1.96* 1.99*  CALCIUM  9.6  --  8.7* 9.1  MG  --   --   --  2.7*  PHOS  --   --   --  3.6   GFR: Estimated Creatinine Clearance: 34.1 mL/min (A) (by C-G formula based on SCr of 1.99 mg/dL (H)). Liver Function Tests: Recent Labs  Lab 10/01/24 0325  AST 79*  ALT 77*  ALKPHOS 78  BILITOT 0.7  PROT 6.9  ALBUMIN 3.8   BNP (last 3 results) Recent Labs    09/30/24 2031  PROBNP 15,139.0*   HbA1C: Recent Labs    09/30/24 2031  HGBA1C 8.3*   Recent Results (from the past 240 hours)  Resp panel by RT-PCR (RSV, Flu A&B, Covid) Anterior Nasal Swab     Status: None   Collection Time: 09/30/24  8:31 PM   Specimen: Anterior Nasal Swab  Result Value Ref Range Status   SARS Coronavirus 2 by RT PCR NEGATIVE NEGATIVE Final    Comment: (NOTE) SARS-CoV-2 target nucleic acids are NOT DETECTED.  The SARS-CoV-2 RNA is generally detectable in upper respiratory specimens during the acute phase of  infection. The lowest concentration of SARS-CoV-2 viral copies this assay can detect is 138 copies/mL. A negative result does not preclude SARS-Cov-2 infection and should not be used as the sole basis for treatment or other patient management decisions. A negative result may occur with  improper specimen collection/handling, submission of specimen other than nasopharyngeal swab, presence of viral mutation(s) within the areas targeted by this assay, and inadequate number of viral copies(<138 copies/mL). A negative result must be combined with clinical observations, patient history, and epidemiological information. The expected result is Negative.  Fact Sheet for Patients:  BloggerCourse.com  Fact Sheet for Healthcare Providers:  SeriousBroker.it  This test is no t yet approved or cleared by the [place]  FDA and  has been authorized for detection and/or diagnosis of SARS-CoV-2 by FDA under an Emergency Use Authorization (EUA). This EUA will remain  in effect (meaning this test can be used) for the duration of the COVID-19 declaration under Section 564(b)(1) of the Act, 21 U.S.C.section 360bbb-3(b)(1), unless the authorization is terminated  or revoked sooner.       Influenza A by PCR NEGATIVE NEGATIVE Final   Influenza B by PCR NEGATIVE NEGATIVE Final    Comment: (NOTE) The Xpert Xpress SARS-CoV-2/FLU/RSV plus assay is intended as an aid in the diagnosis of influenza from Nasopharyngeal swab specimens and should not be used as a sole basis for treatment. Nasal washings and aspirates are unacceptable for Xpert Xpress SARS-CoV-2/FLU/RSV testing.  Fact Sheet for Patients: BloggerCourse.com  Fact Sheet for Healthcare Providers: SeriousBroker.it  This test is not yet approved or cleared by the [place]  FDA and has been authorized for detection and/or diagnosis of SARS-CoV-2  by FDA under an Emergency Use Authorization (EUA). This EUA will remain in effect (meaning this test can be used) for the duration of  the COVID-19 declaration under Section 564(b)(1) of the Act, 21 U.S.C. section 360bbb-3(b)(1), unless the authorization is terminated or revoked.     Resp Syncytial Virus by PCR NEGATIVE NEGATIVE Final    Comment: (NOTE) Fact Sheet for Patients: BloggerCourse.com  Fact Sheet for Healthcare Providers: SeriousBroker.it  This test is not yet approved or cleared by the [place]  FDA and has been authorized for detection and/or diagnosis of SARS-CoV-2 by FDA under an Emergency Use Authorization (EUA). This EUA will remain in effect (meaning this test can be used) for the duration of the COVID-19 declaration under Section 564(b)(1) of the Act, 21 U.S.C. section 360bbb-3(b)(1), unless the authorization is terminated or revoked.  Performed at [facility], [address], [place], [place] [address]   Blood culture (routine x 2)     Status: None (Preliminary result)   Collection Time: 09/30/24  9:07 PM   Specimen: BLOOD  Result Value Ref Range Status   Specimen Description BLOOD LEFT ANTECUBITAL  Final   Special Requests   Final    BOTTLES DRAWN AEROBIC AND ANAEROBIC Blood Culture results may not be optimal due to an inadequate volume of blood received in culture bottles   Culture   Final    NO GROWTH < 24 HOURS Performed at [facility], [address], [place], [place] [address]    Report Status PENDING  Incomplete  Blood culture (routine x 2)     Status: None (Preliminary result)   Collection Time: 09/30/24  9:42 PM   Specimen: BLOOD  Result Value Ref Range Status   Specimen Description BLOOD BLOOD RIGHT HAND  Final   Special Requests   Final    BOTTLES DRAWN AEROBIC AND ANAEROBIC Blood Culture adequate volume   Culture   Final    NO GROWTH < 12 HOURS Performed at [facility], [address], [place], [place] [address]    Report Status PENDING  Incomplete  MRSA Next Gen by PCR, Nasal     Status: None   Collection Time: 09/30/24 10:08 PM   Specimen: Nasal Mucosa; Nasal Swab  Result Value Ref Range Status   MRSA by PCR Next Gen NOT DETECTED NOT DETECTED Final    Comment: (NOTE) The GeneXpert MRSA Assay (FDA approved for NASAL specimens only), is one component of a comprehensive MRSA colonization surveillance program. It is not intended to diagnose MRSA infection nor to guide or monitor treatment for MRSA infections. Test performance is not FDA approved in patients less than 32 years old. Performed at [facility], [address], [place], [place] [address]     Radiology Studies: ECHOCARDIOGRAM COMPLETE Result Date: 10/01/2024    ECHOCARDIOGRAM REPORT   Patient Name:   Clarence Dawson Date of Exam: 10/01/2024 Medical Rec #:  [id number]       Height:       68.0 in Accession #:    [id number]      Weight:       208.3 lb Date of Birth:  07-Jan-1946       BSA:          2.080 m Patient Age:    78 years        BP:           97/51 mmHg Patient Gender: M               HR:           74 bpm. Exam Location:  [facility] Procedure: 2D Echo,  Intracardiac Opacification Agent, Color Doppler and Cardiac            Doppler (Both Spectral and Color Flow Doppler were utilized during            procedure). Indications:    CHF- Acute Diastolic I50.31  History:        Patient has prior history of Echocardiogram examinations, most                 recent 06/10/2024. CHF, Pacemaker, COPD and acute respiratory                 distress, Arrythmias:Atrial Fibrillation,                 Signs/Symptoms:Shortness of Breath and Dyspnea; Risk                 Factors:Hypertension, Dyslipidemia, Diabetes and Sleep Apnea.  Sonographer:    Koleen Popper RDCS Referring Phys: [id number] OLADAPO ADEFESO  Sonographer Comments: Suboptimal apical window. Image acquisition challenging due to COPD and Image acquisition challenging due to  respiratory motion. IMPRESSIONS  1. No LV thrombus by Definity. Left ventricular ejection fraction, by estimation, is 55 to 60%. The left ventricle has normal function. Left ventricular endocardial border not optimally defined to evaluate regional wall motion. Left ventricular diastolic parameters are indeterminate.  2. Right ventricular systolic function is moderately reduced. The right ventricular size is severely enlarged. There is severely elevated pulmonary artery systolic pressure. The estimated right ventricular systolic pressure is 60.7 mmHg.  3. The mitral valve is normal in structure. No evidence of mitral valve regurgitation. No evidence of mitral stenosis.  4. The aortic valve was not well visualized. Aortic valve regurgitation is not visualized. Mild to moderate aortic valve stenosis. Aortic valve area, by VTI measures 1.03 cm. Aortic valve mean gradient measures 14.0 mmHg. Aortic valve Vmax measures 2.45 m/s.  5. The inferior vena cava is dilated in size with >50% respiratory variability, suggesting right atrial pressure of 8 mmHg. FINDINGS  Left Ventricle: No LV thrombus by Definity. Left ventricular ejection fraction, by estimation, is 55 to 60%. The left ventricle has normal function. Left ventricular endocardial border not optimally defined to evaluate regional wall motion. Strain was performed and the global longitudinal strain is indeterminate. The left ventricular internal cavity size was normal in size. There is no left ventricular hypertrophy. Left ventricular diastolic parameters are indeterminate. Right Ventricle: The right ventricular size is severely enlarged. No increase in right ventricular wall thickness. Right ventricular systolic function is moderately reduced. There is severely elevated pulmonary artery systolic pressure. The tricuspid regurgitant velocity is 3.63 m/s, and with an assumed right atrial pressure of 8 mmHg, the estimated right ventricular systolic pressure is 60.7  mmHg. Left Atrium: Left atrial size was normal in size. Right Atrium: Right atrial size was normal in size. Pericardium: There is no evidence of pericardial effusion. Mitral Valve: The mitral valve is normal in structure. No evidence of mitral valve regurgitation. No evidence of mitral valve stenosis. Tricuspid Valve: The tricuspid valve is normal in structure. Tricuspid valve regurgitation is not demonstrated. No evidence of tricuspid stenosis. Aortic Valve: The aortic valve was not well visualized. Aortic valve regurgitation is not visualized. Mild to moderate aortic stenosis is present. Aortic valve mean gradient measures 14.0 mmHg. Aortic valve peak gradient measures 23.9 mmHg. Aortic valve area, by VTI measures 1.03 cm. Pulmonic Valve: The pulmonic valve was not well visualized. Pulmonic valve regurgitation is trivial. No evidence of pulmonic  stenosis. Aorta: The aortic root is normal in size and structure. Venous: The inferior vena cava is dilated in size with greater than 50% respiratory variability, suggesting right atrial pressure of 8 mmHg. IAS/Shunts: No atrial level shunt detected by color flow Doppler. Additional Comments: 3D was performed not requiring image post processing on an independent workstation and was indeterminate.  LEFT VENTRICLE PLAX 2D LVIDd:         5.50 cm   Diastology LVIDs:         4.20 cm   LV e' medial:    4.40 cm/s LV PW:         1.10 cm   LV E/e' medial:  17.2 LV IVS:        0.90 cm   LV e' lateral:   11.30 cm/s LVOT diam:     2.00 cm   LV E/e' lateral: 6.7 LV SV:         54 LV SV Index:   26 LVOT Area:     3.14 cm  RIGHT VENTRICLE             IVC RV Basal diam:  5.30 cm     IVC diam: 3.50 cm RV Mid diam:    4.80 cm RV S prime:     13.80 cm/s TAPSE (M-mode): 2.6 cm LEFT ATRIUM           Index        RIGHT ATRIUM           Index LA diam:      4.00 cm 1.92 cm/m   RA Area:     20.60 cm LA Vol (A4C): 63.3 ml 30.44 ml/m  RA Volume:   54.30 ml  26.11 ml/m  AORTIC VALVE AV Area  (Vmax):    1.08 cm AV Area (Vmean):   0.97 cm AV Area (VTI):     1.03 cm AV Vmax:           244.67 cm/s AV Vmean:          170.000 cm/s AV VTI:            0.522 m AV Peak Grad:      23.9 mmHg AV Mean Grad:      14.0 mmHg LVOT Vmax:         84.20 cm/s LVOT Vmean:        52.350 cm/s LVOT VTI:          0.172 m LVOT/AV VTI ratio: 0.33  AORTA Ao Root diam: 3.60 cm MITRAL VALVE               TRICUSPID VALVE MV Area (PHT): 3.31 cm    TR Peak grad:   52.7 mmHg MV Decel Time: 229 msec    TR Vmax:        363.00 cm/s MV E velocity: 75.80 cm/s MV A velocity: 56.40 cm/s  SHUNTS MV E/A ratio:  1.34        Systemic VTI:  0.17 m                            Systemic Diam: 2.00 cm Vishnu Priya Mallipeddi Electronically signed by Diannah Late Mallipeddi Signature Date/Time: 10/01/2024/2:11:57 PM    Final    DG Chest Port 1 View Result Date: 09/30/2024 EXAM: 1 VIEW XRAY OF THE CHEST 09/30/2024 09:18:05 PM COMPARISON: 04/06/2024 CLINICAL HISTORY: Dib. Pt bib EMS for respiratory distress. EMS reports  called to house for SOB that had gotten worse throughout day. EMS reports pt was on O2 @ 2L nasal cannula (normally wears O2 @ 4.5L) upon their arrival and sats were reading 62%. EMS placed pt on Cpap and ; reported sats of 75-78%. FINDINGS: LUNGS AND PLEURA: Hypoinflated lungs with retrocardiac opacification likely related to at least partial left lower lobe collapse. Emphysema. Unchanged bibasilar linear opacities. No pleural effusion. No pneumothorax. HEART AND MEDIASTINUM: Stable left chest wall dual lead pacemaker. No acute abnormality of the cardiac and mediastinal silhouettes. BONES AND SOFT TISSUES: No acute osseous abnormality. IMPRESSION: 1. Hypoinflated lungs with retrocardiac opacification likely related to at least partial left lower lobe collapse. 2. Emphysema. Electronically signed by: Dorethia Molt MD 09/30/2024 09:22 PM EDT RP Workstation: [id number]   Scheduled Meds:  atorvastatin   80 mg Oral Daily   Chlorhexidine   Gluconate Cloth  6 each Topical Daily   dextromethorphan -guaiFENesin   1 tablet Oral BID   empagliflozin   25 mg Oral QAC breakfast   ezetimibe   10 mg Oral Daily   furosemide   40 mg Intravenous BID   insulin  aspart  0-5 Units Subcutaneous QHS   insulin  aspart  0-9 Units Subcutaneous TID WC   insulin  glargine  10 Units Subcutaneous QHS   levothyroxine   150 mcg Oral QAC breakfast   methylPREDNISolone  (SOLU-MEDROL ) injection  40 mg Intravenous Q12H   pantoprazole   40 mg Oral Daily   rivaroxaban   15 mg Oral Q supper   Continuous Infusions:  cefTRIAXone  (ROCEPHIN )  IV     doxycycline  (VIBRAMYCIN ) IV 100 mg (10/01/24 0927)    LOS: 1 day   Rendall Carwin M.D on 10/01/2024 at 4:15 PM  Go to [email or web address] - for contact info  [facility] - Office  [phone]  If 7PM-7AM, please contact night-coverage [email or web address] 10/01/2024, 4:15 PM

## 2024-10-01 NOTE — Progress Notes (Signed)
  Echocardiogram 2D Echocardiogram has been performed.  Koleen KANDICE Popper, RDCS 10/01/2024, 9:24 AM

## 2024-10-01 NOTE — Plan of Care (Signed)
   Problem: Education: Goal: Knowledge of General Education information will improve Description Including pain rating scale, medication(s)/side effects and non-pharmacologic comfort measures Outcome: Progressing   Problem: Health Behavior/Discharge Planning: Goal: Ability to manage health-related needs will improve Outcome: Progressing

## 2024-10-01 NOTE — TOC Initial Note (Signed)
 Transition of Care [facility]) - Initial/Assessment Note    Patient Details  Name: Clarence Dawson MRN: [id number] Date of Birth: 06/04/1946  Transition [facility]) CM/SW Contact:    Lucie Lunger, LCSWA Phone Number: 10/01/2024, 11:38 AM  Clinical Narrative:                 Pt is high risk for readmission. CSW spoke with pt at bedside to complete assessment. Pt states he lives with his sister and brother in Social worker. Pt is independent in completing his ADLs and able to drive when needed. Pt states he has had HH in the past but no services currently. Pt states he has a cane, walker and wheelchair in the home. TOC to follow.   Expected Discharge Plan: Home/Self Care Barriers to Discharge: Continued Medical Work up   Patient Goals and CMS Choice Patient states their goals for this hospitalization and ongoing recovery are:: get better CMS Medicare.gov Compare Post Acute Care list provided to:: Patient Choice offered to / list presented to : Patient      Expected Discharge Plan and Services In-house Referral: Clinical Social Work Discharge Planning Services: CM Consult   Living arrangements for the past 2 months: Single Family Home                                      Prior Living Arrangements/Services Living arrangements for the past 2 months: Single Family Home Lives with:: Siblings Patient language and need for interpreter reviewed:: Yes Do you feel safe going back to the place where you live?: Yes      Need for Family Participation in Patient Care: Yes (Comment) Care giver support system in place?: Yes (comment) Current home services: DME Criminal Activity/Legal Involvement Pertinent to Current Situation/Hospitalization: No - Comment as needed  Activities of Daily Living   ADL Screening (condition at time of admission) Independently performs ADLs?: Yes (appropriate for developmental age) Is the patient deaf or have difficulty hearing?: No Does the patient have  difficulty seeing, even when wearing glasses/contacts?: No Does the patient have difficulty concentrating, remembering, or making decisions?: No  Permission Sought/Granted                  Emotional Assessment Appearance:: Appears stated age Attitude/Demeanor/Rapport: Engaged Affect (typically observed): Accepting Orientation: : Oriented to Self, Oriented to Place, Oriented to  Time, Oriented to Situation Alcohol  / Substance Use: Not Applicable Psych Involvement: No (comment)  Admission diagnosis:  Acute respiratory failure with hypoxia (HCC) [J96.01] AKI (acute kidney injury) [N17.9] Sepsis due to pneumonia (HCC) [J18.9, A41.9] Community acquired pneumonia, unspecified laterality [J18.9] Acute congestive heart failure, unspecified heart failure type (HCC) [I50.9] Sepsis, due to unspecified organism, unspecified whether acute organ dysfunction present [facility]) [A41.9] Patient Active Problem List   Diagnosis Date Noted   Hyperkalemia 10/01/2024   Type 2 diabetes mellitus with hyperglycemia (HCC) 10/01/2024   AKI (acute kidney injury) 10/01/2024   Obesity, Class I, BMI 30-34.9 10/01/2024   Lactic acidosis 04/07/2024   Elevated brain natriuretic peptide (BNP) level 04/07/2024   COPD (chronic obstructive pulmonary disease) (HCC) 04/07/2024   Elevated troponin 04/07/2024   Prolonged QT interval 04/07/2024   Atrial fibrillation, chronic (HCC) 04/07/2024   Acquired hypothyroidism 04/07/2024   Encounter for monitoring dofetilide  therapy 02/25/2024   Chronic respiratory failure with hypoxia (HCC) 06/12/2023   Hypercoagulable state due to persistent atrial fibrillation (HCC) 01/02/2020  Acute on chronic diastolic CHF (congestive heart failure) (HCC) 11/22/2019   Paroxysmal atrial fibrillation (HCC) 09/22/2019   Atrial fibrillation with RVR (HCC)    Lobar pneumonia 09/21/2019   Acute respiratory failure with hypoxia (HCC) 09/21/2019   Acute on chronic respiratory failure with hypoxia  (HCC) 09/18/2019   Elevated lactic acid level    GERD (gastroesophageal reflux disease)    Chronic bronchitis (HCC) 02/09/2019   Sepsis due to pneumonia (HCC) 03/03/2018   Primary localized osteoarthritis of right knee 08/11/2017   Acute exacerbation of COPD with asthma (HCC) 12/08/2016   Chest pain 10/20/2016   Essential hypertension 10/20/2016   Morbid obesity due to excess calories (HCC) 06/24/2016   Severe persistent asthma (HCC) 02/12/2016   Upper airway cough syndrome 01/24/2016   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   Persistent atrial fibrillation    Syncope    Hyperthyroidism    Mixed hyperlipidemia    RBBB (right bundle branch block)    Tricuspid valve disorder    Aortic valve disorder    Carotid artery stenosis    Type 2 diabetes mellitus (HCC)    Pacemaker    OSA on CPAP    Arthritis    Sinoatrial node dysfunction (HCC)    PVC's (premature ventricular contractions) 07/17/2011   Essential hypertension, benign 01/24/2011   Premature ventricular contractions 01/24/2011   PPM-St.Jude 01/24/2011   PCP:  Sheryle Carwin, MD Pharmacy:   [facility] - [place], [place] - [address] [place] [address] Phone: [phone] Fax: [phone]  [facility] - [place], [place] - [address] [place] [address] Phone: [phone] Fax: [phone]  [facility] - [place], [place] - [address] [place] [address] Phone: [phone] Fax: [phone]  [facility] [place], [place] - [address] [place] [address] Phone: [phone] Fax: [phone]     Social Drivers of Health (SDOH) Social History: SDOH Screenings   Food Insecurity: No Food Insecurity (09/30/2024)  Housing: Low Risk  (09/30/2024)  Transportation Needs: No Transportation Needs (09/30/2024)  Utilities: Not At Risk (09/30/2024)  Depression (PHQ2-9): Low  Risk  (09/03/2023)  Social Connections: Socially Isolated (09/30/2024)  Tobacco Use: Medium Risk (09/30/2024)   SDOH Interventions:     Readmission Risk Interventions    10/01/2024   11:37 AM 04/06/2024   10:08 PM  Readmission Risk Prevention Plan  Post Dischage Appt  Complete  Medication Screening  Complete  Transportation Screening Complete Complete  HRI or Home Care Consult Complete   Social Work Consult for Recovery Care Planning/Counseling Complete   Palliative Care Screening Not Applicable   Medication Review Oceanographer) Complete

## 2024-10-02 DIAGNOSIS — N179 Acute kidney failure, unspecified: Secondary | ICD-10-CM | POA: Diagnosis not present

## 2024-10-02 DIAGNOSIS — I4819 Other persistent atrial fibrillation: Secondary | ICD-10-CM | POA: Diagnosis not present

## 2024-10-02 DIAGNOSIS — J9621 Acute and chronic respiratory failure with hypoxia: Secondary | ICD-10-CM | POA: Diagnosis not present

## 2024-10-02 DIAGNOSIS — J441 Chronic obstructive pulmonary disease with (acute) exacerbation: Secondary | ICD-10-CM | POA: Diagnosis not present

## 2024-10-02 LAB — RESPIRATORY PANEL BY PCR

## 2024-10-02 LAB — CBC
HCT: 43.4 % (ref 39.0–52.0)
Hemoglobin: 14.3 g/dL (ref 13.0–17.0)
MCH: 30.2 pg (ref 26.0–34.0)
MCHC: 32.9 g/dL (ref 30.0–36.0)
MCV: 91.8 fL (ref 80.0–100.0)
Platelets: 197 K/uL (ref 150–400)
RBC: 4.73 MIL/uL (ref 4.22–5.81)
RDW: 15.5 % (ref 11.5–15.5)
WBC: 16 K/uL — ABNORMAL HIGH (ref 4.0–10.5)
nRBC: 0 % (ref 0.0–0.2)

## 2024-10-02 LAB — EXPECTORATED SPUTUM ASSESSMENT W GRAM STAIN, RFLX TO RESP C

## 2024-10-02 LAB — COMPREHENSIVE METABOLIC PANEL WITH GFR
ALT: 74 U/L — ABNORMAL HIGH (ref 0–44)
AST: 48 U/L — ABNORMAL HIGH (ref 15–41)
Albumin: 3.5 g/dL (ref 3.5–5.0)
Alkaline Phosphatase: 77 U/L (ref 38–126)
Anion gap: 10 (ref 5–15)
BUN: 45 mg/dL — ABNORMAL HIGH (ref 8–23)
CO2: 26 mmol/L (ref 22–32)
Calcium: 9 mg/dL (ref 8.9–10.3)
Chloride: 103 mmol/L (ref 98–111)
Creatinine, Ser: 1.7 mg/dL — ABNORMAL HIGH (ref 0.61–1.24)
GFR, Estimated: 41 mL/min — ABNORMAL LOW (ref >60)
Glucose, Bld: 226 mg/dL — ABNORMAL HIGH (ref 70–99)
Potassium: 5.4 mmol/L — ABNORMAL HIGH (ref 3.5–5.1)
Sodium: 140 mmol/L (ref 135–145)
Total Bilirubin: 0.5 mg/dL (ref 0.0–1.2)
Total Protein: 6.3 g/dL — ABNORMAL LOW (ref 6.5–8.1)

## 2024-10-02 LAB — GLUCOSE, CAPILLARY
Glucose-Capillary: 283 mg/dL — ABNORMAL HIGH (ref 70–99)
Glucose-Capillary: 290 mg/dL — ABNORMAL HIGH (ref 70–99)
Glucose-Capillary: 256 mg/dL — ABNORMAL HIGH (ref 70–99)
Glucose-Capillary: 291 mg/dL — ABNORMAL HIGH (ref 70–99)

## 2024-10-02 MED ORDER — ALBUTEROL SULFATE (2.5 MG/3ML) 0.083% IN NEBU
2.5000 mg | INHALATION_SOLUTION | Freq: Four times a day (QID) | RESPIRATORY_TRACT | Status: DC
Start: 2024-10-03 — End: 2024-10-04
  Administered 2024-10-03 – 2024-10-04 (×4): 2.5 mg via RESPIRATORY_TRACT
  Filled 2024-10-02 (×5): qty 3

## 2024-10-02 MED ORDER — SODIUM ZIRCONIUM CYCLOSILICATE 10 G PO PACK
10.0000 g | PACK | Freq: Once | ORAL | Status: AC
  Administered 2024-10-02: 10 g via ORAL
  Filled 2024-10-02: qty 1

## 2024-10-02 MED ORDER — INSULIN ASPART 100 UNIT/ML IJ SOLN
4.0000 [IU] | Freq: Three times a day (TID) | INTRAMUSCULAR | Status: DC
  Administered 2024-10-02 – 2024-10-03 (×4): 4 [IU] via SUBCUTANEOUS

## 2024-10-02 MED ORDER — REVEFENACIN 175 MCG/3ML IN SOLN
175.0000 ug | Freq: Every day | RESPIRATORY_TRACT | Status: DC
  Administered 2024-10-03 – 2024-10-04 (×2): 175 ug via RESPIRATORY_TRACT
  Filled 2024-10-02 (×2): qty 3

## 2024-10-02 MED ORDER — METHYLPREDNISOLONE SODIUM SUCC 125 MG IJ SOLR
60.0000 mg | Freq: Two times a day (BID) | INTRAMUSCULAR | Status: DC
  Administered 2024-10-02 – 2024-10-04 (×4): 60 mg via INTRAVENOUS
  Filled 2024-10-02 (×4): qty 2

## 2024-10-02 MED ORDER — BUDESONIDE 0.5 MG/2ML IN SUSP
0.5000 mg | Freq: Two times a day (BID) | RESPIRATORY_TRACT | Status: DC
  Administered 2024-10-02 – 2024-10-04 (×4): 0.5 mg via RESPIRATORY_TRACT
  Filled 2024-10-02 (×5): qty 2

## 2024-10-02 MED ORDER — IPRATROPIUM-ALBUTEROL 0.5-2.5 (3) MG/3ML IN SOLN
3.0000 mL | Freq: Three times a day (TID) | RESPIRATORY_TRACT | Status: AC
  Administered 2024-10-02 (×2): 3 mL via RESPIRATORY_TRACT
  Filled 2024-10-02 (×2): qty 3

## 2024-10-02 MED ORDER — ARFORMOTEROL TARTRATE 15 MCG/2ML IN NEBU
15.0000 ug | INHALATION_SOLUTION | Freq: Two times a day (BID) | RESPIRATORY_TRACT | Status: DC
Start: 2024-10-02 — End: 2024-10-04
  Administered 2024-10-02 – 2024-10-04 (×4): 15 ug via RESPIRATORY_TRACT
  Filled 2024-10-02 (×5): qty 2

## 2024-10-02 MED ORDER — SODIUM ZIRCONIUM CYCLOSILICATE 10 G PO PACK: 10.0000 g | PACK | Freq: Once | ORAL | Status: DC

## 2024-10-02 MED ORDER — IPRATROPIUM-ALBUTEROL 0.5-2.5 (3) MG/3ML IN SOLN
3.0000 mL | RESPIRATORY_TRACT | Status: DC | PRN
  Filled 2024-10-02: qty 3

## 2024-10-02 MED ORDER — HYDROCODONE-ACETAMINOPHEN 5-325 MG PO TABS
1.0000 | ORAL_TABLET | Freq: Four times a day (QID) | ORAL | Status: DC | PRN
  Administered 2024-10-03: 1 via ORAL
  Filled 2024-10-02: qty 1

## 2024-10-02 MED ORDER — INSULIN GLARGINE 100 UNIT/ML ~~LOC~~ SOLN
12.0000 [IU] | Freq: Every day | SUBCUTANEOUS | Status: DC
  Administered 2024-10-02: 12 [IU] via SUBCUTANEOUS
  Filled 2024-10-02 (×2): qty 0.12

## 2024-10-02 MED ORDER — IPRATROPIUM-ALBUTEROL 0.5-2.5 (3) MG/3ML IN SOLN: 3.0000 mL | Freq: Four times a day (QID) | RESPIRATORY_TRACT | Status: DC

## 2024-10-02 NOTE — Progress Notes (Signed)
 1159 Patient wanted to finish lunch before taking nebulizer. Clarence Dawson stated he was in no distress and wanted to finish his meal.

## 2024-10-02 NOTE — Progress Notes (Signed)
 Pt stated he feels his stools are becoming hard and he has constipation issues at home, pt would like to have Miralax  or Colace added to his scheduled medications, to avoid becoming impacted. Pt experiences episodes of impaction while at home.

## 2024-10-02 NOTE — Progress Notes (Signed)
 PROGRESS NOTE  [name] FMW:[id number] DOB: 1946/05/06 DOA: 09/30/2024 PCP: Sheryle Carwin, MD  Brief History:  78 year old male with a history of COPD, bronchiectasis, pulm hypertension, sinus node dysfunction status post PPM, chronic respiratory failure on +4.5 L, persistent atrial fibrillation, and obstructive sleep apnea on CPAP presenting with shortness of breath and respiratory distress.  He presented with oxygen  saturation of 70% on his usual home oxygen .  He was placed on BiPAP in the emergency department.  Chest x-ray showed retrocardiac opacities.  Initial labs showed WBC 16.0, hemoglobin 15.7, platelets 249.  Serum creatinine was 2.10.  PCT 2.29.  Blood cultures were obtained and the patient was started on ceftriaxone  and doxycycline . His hospitalization has been prolonged secondary to slow improvement.   Assessment/Plan: 1)Acute on chronic hypoxic respiratory failure due to pneumonia - At home uses 4 to 4-1/2 L of oxygen  via nasal cannula -On admission O2 sat was in the 70s despite oxygen  supplementation patient required BiPAP -Lactic acidosis possibly due to hypoxia in the setting of pneumonia and COPD flare -Lactic acid 7.6 >>> 5.1 >>4.8    2)Sepsis due to  CAP-- POA WBC 16.8 >>13.5, PCT 2.29   -Blood cultures pending --Rocephin /doxycycline , bronchodilators and mucolytics for pneumonia Continue Mucinex , incentive spirometry, flutter valve    3)Acute exacerbation of COPD--due to pneumonia -Management as above in #1 and #2 -iv Solu-Medrol  as ordered - add brovana  - add pulmicort  - start duonebs   4)Chronic HFpEF--elevated proBNP and troponin noted -proBNP 15,139, chest x-ray without CHF type findings -Troponin 71 >>> 70, troponin already flattened.  No CP -No acute EKG changes -Patient received IV Lasix  initially -transitioned to oral Lasix - -he was on Lasix  prior to admission -10/01/2024 Echo with EF of 55 to 60% diastolic parameters indeterminate, mod  decreased RVF, RVSP 60.7 Echocardiogram done on 04/07/2024 showed LVEF 45 to 70%.  No WMA.  G2 DD   5)Partial left lower lobe collapse Chest x-ray showed partial left lower lobe collapse.  Continue incentive parameter   Hyperkalemia Potassium 5.9 >> 6.0 >>4.5 - Potassium normalized after hyperkalemia cocktail No acute EKG changes - remain on telemetry   DM2--uncontrolled with hyperglycemia - 10/3 8.3 reflecting uncontrolled DM with hyperglycemia PTA -Continue Jardiance , Lantus  -Use Novolog /Humalog Sliding scale insulin  with  - Add NovoLog  4 units with meals   Acute kidney injury Creatinine 2.03 >>1.99>>1.70 -(baseline creatinine at 1.0-1.2) -Monitor BMP   Mixed hyperlipidemia Continue Lipitor, Zetia    Acquired hypothyroidism Continue Synthroid    Persistent atrial fibrillation -Continue Xarelto   Zebeta  to be held at this time due to soft BP -Per cardiologist Stop Tikosyn /dofetilide  due to prolonged QTc and AKI and do NOT restart at discharge -Patient will need to follow-up with A-fib clinic to discuss treatment options for A-fib - GERD Continue Protonix    Obstructive sleep apnea Continue BiPAP   Class 1 Obesity- -Low calorie diet, portion control and increase physical activity discussed with patient -Body mass index is 31.68 kg/m.         Family Communication:   noFamily at bedside  Consultants:  none  Code Status:  FULL   DVT Prophylaxis:  xarelto    Procedures: As Listed in Progress Note Above  Antibiotics: Ceftriaxone  10/3>> Doxy 10/3>>      Subjective: Patient complains of shortness of breath this morning.  He denies any chest pain, abdominal pain, nausea, vomiting, diarrhea, abdominal pain.  Objective: Vitals:   10/01/24 2020 10/02/24 0017 10/02/24 0432 10/02/24  0600  BP: 110/87 112/71 (!) 95/59   Pulse: 70 74 66   Resp:      Temp: 99.1 F (37.3 C) 97.7 F (36.5 C) 97.7 F (36.5 C)   TempSrc: Oral Axillary Axillary   SpO2: 91%  91% 90%   Weight:    93.5 kg  Height:        Intake/Output Summary (Last 24 hours) at 10/02/2024 1038 Last data filed at 10/02/2024 0850 Gross per 24 hour  Intake 830.24 ml  Output 1200 ml  Net -369.76 ml   Weight change: -2.514 kg Exam:  General:  Pt is alert, follows commands appropriately, not in acute distress HEENT: No icterus, No thrush, No neck mass, [place]/AT Cardiovascular: RRR, S1/S2, no rubs, no gallops Respiratory: Bibasilar wheeze.  Diminished breath sounds.  Scattered bilateral rales. Abdomen: Soft/+BS, non tender, non distended, no guarding Extremities: 1 + LLE edema, No lymphangitis, No petechiae, No rashes, no synovitis; varicose veins noted   Data Reviewed: I have personally reviewed following labs and imaging studies Basic Metabolic Panel: Recent Labs  Lab 09/30/24 2031 09/30/24 2105 09/30/24 2340 10/01/24 0325 10/02/24 0416  NA 139 135 134* 140 140  K 5.9* 6.0* 4.7 4.5 5.4*  CL 96* 100 97* 98 103  CO2 17*  --  18* 23 26  GLUCOSE 295* 286* 400* 335* 226*  BUN 38* 47* 39* 38* 45*  CREATININE 2.08* 2.10* 1.96* 1.99* 1.70*  CALCIUM  9.6  --  8.7* 9.1 9.0  MG  --   --   --  2.7*  --   PHOS  --   --   --  3.6  --    Liver Function Tests: Recent Labs  Lab 10/01/24 0325 10/02/24 0416  AST 79* 48*  ALT 77* 74*  ALKPHOS 78 77  BILITOT 0.7 0.5  PROT 6.9 6.3*  ALBUMIN 3.8 3.5   No results for input(s): LIPASE, AMYLASE in the last 168 hours. No results for input(s): AMMONIA in the last 168 hours. Coagulation Profile: No results for input(s): INR, PROTIME in the last 168 hours. CBC: Recent Labs  Lab 09/30/24 2031 09/30/24 2105 10/01/24 0325 10/02/24 0416  WBC 16.8*  --  13.5* 16.0*  NEUTROABS 13.1*  --   --   --   HGB 15.7 15.3 15.4 14.3  HCT 48.9 45.0 46.9 43.4  MCV 93.9  --  93.4 91.8  PLT 249  --  183 197   Cardiac Enzymes: No results for input(s): CKTOTAL, CKMB, CKMBINDEX, TROPONINI in the last 168 hours. BNP: Invalid  input(s): POCBNP CBG: Recent Labs  Lab 10/01/24 0731 10/01/24 1123 10/01/24 1625 10/01/24 2215 10/02/24 0716  GLUCAP 271* 240* 254* 279* 283*   HbA1C: Recent Labs    09/30/24 2031  HGBA1C 8.3*   Urine analysis:    Component Value Date/Time   COLORURINE YELLOW 09/30/2024 2230   APPEARANCEUR CLEAR 09/30/2024 2230   LABSPEC 1.016 09/30/2024 2230   PHURINE 5.0 09/30/2024 2230   GLUCOSEU >=500 (A) 09/30/2024 2230   HGBUR NEGATIVE 09/30/2024 2230   BILIRUBINUR NEGATIVE 09/30/2024 2230   KETONESUR NEGATIVE 09/30/2024 2230   PROTEINUR 30 (A) 09/30/2024 2230   UROBILINOGEN 1.0 11/15/2012 1051   NITRITE NEGATIVE 09/30/2024 2230   LEUKOCYTESUR NEGATIVE 09/30/2024 2230   Sepsis Labs: @LABRCNTIP (procalcitonin:4,lacticidven:4) ) Recent Results (from the past 240 hours)  Resp panel by RT-PCR (RSV, Flu A&B, Covid) Anterior Nasal Swab     Status: None   Collection Time: 09/30/24  8:31 PM  Specimen: Anterior Nasal Swab  Result Value Ref Range Status   SARS Coronavirus 2 by RT PCR NEGATIVE NEGATIVE Final    Comment: (NOTE) SARS-CoV-2 target nucleic acids are NOT DETECTED.  The SARS-CoV-2 RNA is generally detectable in upper respiratory specimens during the acute phase of infection. The lowest concentration of SARS-CoV-2 viral copies this assay can detect is 138 copies/mL. A negative result does not preclude SARS-Cov-2 infection and should not be used as the sole basis for treatment or other patient management decisions. A negative result may occur with  improper specimen collection/handling, submission of specimen other than nasopharyngeal swab, presence of viral mutation(s) within the areas targeted by this assay, and inadequate number of viral copies(<138 copies/mL). A negative result must be combined with clinical observations, patient history, and epidemiological information. The expected result is Negative.  Fact Sheet for Patients:   BloggerCourse.com  Fact Sheet for Healthcare Providers:  SeriousBroker.it  This test is no t yet approved or cleared by the [place]  FDA and  has been authorized for detection and/or diagnosis of SARS-CoV-2 by FDA under an Emergency Use Authorization (EUA). This EUA will remain  in effect (meaning this test can be used) for the duration of the COVID-19 declaration under Section 564(b)(1) of the Act, 21 U.S.C.section 360bbb-3(b)(1), unless the authorization is terminated  or revoked sooner.       Influenza A by PCR NEGATIVE NEGATIVE Final   Influenza B by PCR NEGATIVE NEGATIVE Final    Comment: (NOTE) The Xpert Xpress SARS-CoV-2/FLU/RSV plus assay is intended as an aid in the diagnosis of influenza from Nasopharyngeal swab specimens and should not be used as a sole basis for treatment. Nasal washings and aspirates are unacceptable for Xpert Xpress SARS-CoV-2/FLU/RSV testing.  Fact Sheet for Patients: BloggerCourse.com  Fact Sheet for Healthcare Providers: SeriousBroker.it  This test is not yet approved or cleared by the [place]  FDA and has been authorized for detection and/or diagnosis of SARS-CoV-2 by FDA under an Emergency Use Authorization (EUA). This EUA will remain in effect (meaning this test can be used) for the duration of the COVID-19 declaration under Section 564(b)(1) of the Act, 21 U.S.C. section 360bbb-3(b)(1), unless the authorization is terminated or revoked.     Resp Syncytial Virus by PCR NEGATIVE NEGATIVE Final    Comment: (NOTE) Fact Sheet for Patients: BloggerCourse.com  Fact Sheet for Healthcare Providers: SeriousBroker.it  This test is not yet approved or cleared by the [place]  FDA and has been authorized for detection and/or diagnosis of SARS-CoV-2 by FDA under an Emergency Use  Authorization (EUA). This EUA will remain in effect (meaning this test can be used) for the duration of the COVID-19 declaration under Section 564(b)(1) of the Act, 21 U.S.C. section 360bbb-3(b)(1), unless the authorization is terminated or revoked.  Performed at [facility], [address], [place], [place] [address]   Blood culture (routine x 2)     Status: None (Preliminary result)   Collection Time: 09/30/24  9:07 PM   Specimen: BLOOD  Result Value Ref Range Status   Specimen Description BLOOD LEFT ANTECUBITAL  Final   Special Requests   Final    BOTTLES DRAWN AEROBIC AND ANAEROBIC Blood Culture results may not be optimal due to an inadequate volume of blood received in culture bottles   Culture   Final    NO GROWTH < 24 HOURS Performed at [facility], [address], [place], [place] [address]    Report Status PENDING  Incomplete  Blood  culture (routine x 2)     Status: None (Preliminary result)   Collection Time: 09/30/24  9:42 PM   Specimen: BLOOD  Result Value Ref Range Status   Specimen Description BLOOD BLOOD RIGHT HAND  Final   Special Requests   Final    BOTTLES DRAWN AEROBIC AND ANAEROBIC Blood Culture adequate volume   Culture   Final    NO GROWTH < 12 HOURS Performed at [facility], [address], [place], [place] [address]    Report Status PENDING  Incomplete  MRSA Next Gen by PCR, Nasal     Status: None   Collection Time: 09/30/24 10:08 PM   Specimen: Nasal Mucosa; Nasal Swab  Result Value Ref Range Status   MRSA by PCR Next Gen NOT DETECTED NOT DETECTED Final    Comment: (NOTE) The GeneXpert MRSA Assay (FDA approved for NASAL specimens only), is one component of a comprehensive MRSA colonization surveillance program. It is not intended to diagnose MRSA infection nor to guide or monitor treatment for MRSA infections. Test performance is not FDA approved in patients less than 54 years old. Performed at [facility], [address], [place], [place]  [address]      Scheduled Meds:  [START ON 10/03/2024] albuterol   2.5 mg Nebulization Q6H   arformoterol   15 mcg Nebulization BID   atorvastatin   80 mg Oral Daily   budesonide  (PULMICORT ) nebulizer solution  0.5 mg Nebulization BID   dextromethorphan -guaiFENesin   1 tablet Oral BID   empagliflozin   25 mg Oral QAC breakfast   ezetimibe   10 mg Oral Daily   furosemide   40 mg Oral Daily   insulin  aspart  0-5 Units Subcutaneous QHS   insulin  aspart  0-9 Units Subcutaneous TID WC   insulin  glargine  10 Units Subcutaneous QHS   ipratropium-albuterol   3 mL Nebulization Q8H   levothyroxine   150 mcg Oral QAC breakfast   methylPREDNISolone  (SOLU-MEDROL ) injection  40 mg Intravenous Q12H   pantoprazole   40 mg Oral Daily   [START ON 10/03/2024] revefenacin  175 mcg Nebulization Daily   rivaroxaban   15 mg Oral Q supper   sodium zirconium cyclosilicate  10 g Oral Once   Continuous Infusions:  cefTRIAXone  (ROCEPHIN )  IV Stopped (10/01/24 2200)   doxycycline  (VIBRAMYCIN ) IV 100 mg (10/02/24 0851)    Procedures/Studies: ECHOCARDIOGRAM COMPLETE Result Date: 10/01/2024    ECHOCARDIOGRAM REPORT   Patient Name:   Clarence Dawson Date of Exam: 10/01/2024 Medical Rec #:  [id number]       Height:       68.0 in Accession #:    [id number]      Weight:       208.3 lb Date of Birth:  11-22-1946       BSA:          2.080 m Patient Age:    78 years        BP:           97/51 mmHg Patient Gender: M               HR:           74 bpm. Exam Location:  [facility] Procedure: 2D Echo, Intracardiac Opacification Agent, Color Doppler and Cardiac            Doppler (Both Spectral and Color Flow Doppler were utilized during            procedure). Indications:    CHF- Acute Diastolic I50.31  History:  Patient has prior history of Echocardiogram examinations, most                 recent 06/10/2024. CHF, Pacemaker, COPD and acute respiratory                 distress, Arrythmias:Atrial Fibrillation,                  Signs/Symptoms:Shortness of Breath and Dyspnea; Risk                 Factors:Hypertension, Dyslipidemia, Diabetes and Sleep Apnea.  Sonographer:    Koleen Popper RDCS Referring Phys: [id number] OLADAPO ADEFESO  Sonographer Comments: Suboptimal apical window. Image acquisition challenging due to COPD and Image acquisition challenging due to respiratory motion. IMPRESSIONS  1. No LV thrombus by Definity. Left ventricular ejection fraction, by estimation, is 55 to 60%. The left ventricle has normal function. Left ventricular endocardial border not optimally defined to evaluate regional wall motion. Left ventricular diastolic parameters are indeterminate.  2. Right ventricular systolic function is moderately reduced. The right ventricular size is severely enlarged. There is severely elevated pulmonary artery systolic pressure. The estimated right ventricular systolic pressure is 60.7 mmHg.  3. The mitral valve is normal in structure. No evidence of mitral valve regurgitation. No evidence of mitral stenosis.  4. The aortic valve was not well visualized. Aortic valve regurgitation is not visualized. Mild to moderate aortic valve stenosis. Aortic valve area, by VTI measures 1.03 cm. Aortic valve mean gradient measures 14.0 mmHg. Aortic valve Vmax measures 2.45 m/s.  5. The inferior vena cava is dilated in size with >50% respiratory variability, suggesting right atrial pressure of 8 mmHg. FINDINGS  Left Ventricle: No LV thrombus by Definity. Left ventricular ejection fraction, by estimation, is 55 to 60%. The left ventricle has normal function. Left ventricular endocardial border not optimally defined to evaluate regional wall motion. Strain was performed and the global longitudinal strain is indeterminate. The left ventricular internal cavity size was normal in size. There is no left ventricular hypertrophy. Left ventricular diastolic parameters are indeterminate. Right Ventricle: The right ventricular size is severely  enlarged. No increase in right ventricular wall thickness. Right ventricular systolic function is moderately reduced. There is severely elevated pulmonary artery systolic pressure. The tricuspid regurgitant velocity is 3.63 m/s, and with an assumed right atrial pressure of 8 mmHg, the estimated right ventricular systolic pressure is 60.7 mmHg. Left Atrium: Left atrial size was normal in size. Right Atrium: Right atrial size was normal in size. Pericardium: There is no evidence of pericardial effusion. Mitral Valve: The mitral valve is normal in structure. No evidence of mitral valve regurgitation. No evidence of mitral valve stenosis. Tricuspid Valve: The tricuspid valve is normal in structure. Tricuspid valve regurgitation is not demonstrated. No evidence of tricuspid stenosis. Aortic Valve: The aortic valve was not well visualized. Aortic valve regurgitation is not visualized. Mild to moderate aortic stenosis is present. Aortic valve mean gradient measures 14.0 mmHg. Aortic valve peak gradient measures 23.9 mmHg. Aortic valve area, by VTI measures 1.03 cm. Pulmonic Valve: The pulmonic valve was not well visualized. Pulmonic valve regurgitation is trivial. No evidence of pulmonic stenosis. Aorta: The aortic root is normal in size and structure. Venous: The inferior vena cava is dilated in size with greater than 50% respiratory variability, suggesting right atrial pressure of 8 mmHg. IAS/Shunts: No atrial level shunt detected by color flow Doppler. Additional Comments: 3D was performed not requiring image post processing on an independent workstation  and was indeterminate.  LEFT VENTRICLE PLAX 2D LVIDd:         5.50 cm   Diastology LVIDs:         4.20 cm   LV e' medial:    4.40 cm/s LV PW:         1.10 cm   LV E/e' medial:  17.2 LV IVS:        0.90 cm   LV e' lateral:   11.30 cm/s LVOT diam:     2.00 cm   LV E/e' lateral: 6.7 LV SV:         54 LV SV Index:   26 LVOT Area:     3.14 cm  RIGHT VENTRICLE              IVC RV Basal diam:  5.30 cm     IVC diam: 3.50 cm RV Mid diam:    4.80 cm RV S prime:     13.80 cm/s TAPSE (M-mode): 2.6 cm LEFT ATRIUM           Index        RIGHT ATRIUM           Index LA diam:      4.00 cm 1.92 cm/m   RA Area:     20.60 cm LA Vol (A4C): 63.3 ml 30.44 ml/m  RA Volume:   54.30 ml  26.11 ml/m  AORTIC VALVE AV Area (Vmax):    1.08 cm AV Area (Vmean):   0.97 cm AV Area (VTI):     1.03 cm AV Vmax:           244.67 cm/s AV Vmean:          170.000 cm/s AV VTI:            0.522 m AV Peak Grad:      23.9 mmHg AV Mean Grad:      14.0 mmHg LVOT Vmax:         84.20 cm/s LVOT Vmean:        52.350 cm/s LVOT VTI:          0.172 m LVOT/AV VTI ratio: 0.33  AORTA Ao Root diam: 3.60 cm MITRAL VALVE               TRICUSPID VALVE MV Area (PHT): 3.31 cm    TR Peak grad:   52.7 mmHg MV Decel Time: 229 msec    TR Vmax:        363.00 cm/s MV E velocity: 75.80 cm/s MV A velocity: 56.40 cm/s  SHUNTS MV E/A ratio:  1.34        Systemic VTI:  0.17 m                            Systemic Diam: 2.00 cm Vishnu Priya Mallipeddi Electronically signed by Diannah Late Mallipeddi Signature Date/Time: 10/01/2024/2:11:57 PM    Final    DG Chest Port 1 View Result Date: 09/30/2024 EXAM: 1 VIEW XRAY OF THE CHEST 09/30/2024 09:18:05 PM COMPARISON: 04/06/2024 CLINICAL HISTORY: Dib. Pt bib EMS for respiratory distress. EMS reports called to house for SOB that had gotten worse throughout day. EMS reports pt was on O2 @ 2L nasal cannula (normally wears O2 @ 4.5L) upon their arrival and sats were reading 62%. EMS placed pt on Cpap and ; reported sats of 75-78%. FINDINGS: LUNGS AND PLEURA: Hypoinflated lungs with retrocardiac opacification likely related to  at least partial left lower lobe collapse. Emphysema. Unchanged bibasilar linear opacities. No pleural effusion. No pneumothorax. HEART AND MEDIASTINUM: Stable left chest wall dual lead pacemaker. No acute abnormality of the cardiac and mediastinal silhouettes. BONES AND SOFT  TISSUES: No acute osseous abnormality. IMPRESSION: 1. Hypoinflated lungs with retrocardiac opacification likely related to at least partial left lower lobe collapse. 2. Emphysema. Electronically signed by: Dorethia Molt MD 09/30/2024 09:22 PM EDT RP Workstation: [id number]    Alm Schneider, DO  [facility]  If 7PM-7AM, please contact night-coverage [email or web address] Password [email or web address] 10/02/2024, 10:38 AM   LOS: 2 days

## 2024-10-02 NOTE — Plan of Care (Signed)

## 2024-10-02 NOTE — Plan of Care (Signed)
  Problem: Clinical Measurements: Goal: Ability to maintain clinical measurements within normal limits will improve Outcome: Progressing Goal: Will remain free from infection Outcome: Progressing Goal: Diagnostic test results will improve Outcome: Progressing Goal: Respiratory complications will improve Outcome: Progressing Goal: Cardiovascular complication will be avoided Outcome: Progressing   Problem: Nutrition: Goal: Adequate nutrition will be maintained Outcome: Progressing   Problem: Coping: Goal: Level of anxiety will decrease Outcome: Progressing   Problem: Elimination: Goal: Will not experience complications related to bowel motility Outcome: Progressing Goal: Will not experience complications related to urinary retention Outcome: Progressing   Problem: Skin Integrity: Goal: Risk for impaired skin integrity will decrease Outcome: Progressing   Problem: Clinical Measurements: Goal: Ability to maintain a body temperature in the normal range will improve Outcome: Progressing   Problem: Respiratory: Goal: Ability to maintain adequate ventilation will improve Outcome: Progressing Goal: Ability to maintain a clear airway will improve Outcome: Progressing   Problem: Coping: Goal: Ability to adjust to condition or change in health will improve Outcome: Progressing   Problem: Fluid Volume: Goal: Ability to maintain a balanced intake and output will improve Outcome: Progressing   Problem: Metabolic: Goal: Ability to maintain appropriate glucose levels will improve Outcome: Progressing   Problem: Nutritional: Goal: Maintenance of adequate nutrition will improve Outcome: Progressing Goal: Progress toward achieving an optimal weight will improve Outcome: Progressing   Problem: Skin Integrity: Goal: Risk for impaired skin integrity will decrease Outcome: Progressing   Problem: Tissue Perfusion: Goal: Adequacy of tissue perfusion will improve Outcome:  Progressing

## 2024-10-02 NOTE — Hospital Course (Signed)
 Brief Narrative:  78 year old male with a history of COPD, bronchiectasis, pulm hypertension, sinus node dysfunction status post PPM, chronic respiratory failure on +4.5 L, persistent atrial fibrillation, and obstructive sleep apnea on CPAP presenting with shortness of breath and respiratory distress.  He presented with oxygen  saturation of 70% on his usual home oxygen .  He was placed on BiPAP in the emergency department.  Chest x-ray showed retrocardiac opacities.  Initial labs showed WBC 16.0, hemoglobin 15.7, platelets 249.  Serum creatinine was 2.10.  PCT 2.29.  Blood cultures were obtained and the patient was started on ceftriaxone  and doxycycline . His hospitalization has been prolonged secondary to slow improvement.   Today he feels significantly well and wishing to go home.  Assessment & Plan:  Acute on chronic hypoxic respiratory failure  -Due to COPD exacerbation from pneumonia.  Briefly required BiPAP.   Severe sepsis due to  CAP-- POA - Sepsis physiology is improved, repeat blood work is also stable..  Continue Rocephin /doxycycline .  Bronchodilators/I-S/flutter valve   Acute COPD exacerbation in the setting of pneumonia Chronic hypoxic 4L [place] -Continue steroids, bronchodilators/I-S/flutter valve.  On empiric Rocephin  and doxycycline .  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  Troponins are flat without any chest pain or acute EKG changes.  Initially received IV Lasix  now on p.o. - Echocardiogram October 4025 showed EF 60% with moderately reduced RVEF   Partial left lower lobe collapse Chest x-ray showed partial left lower lobe collapse.  Continue incentive parameter   Hyperkalemia -Resolved   DM2--uncontrolled with hyperglycemia - 10/3 8.3.  Continue Jardiance .  Adjust long-acting, Premeal and sliding scale as appropriate   Acute kidney injury -Baseline creatinine  1.2, upon admission 2.03 now improved   Mixed hyperlipidemia Continue Lipitor, Zetia    Acquired hypothyroidism Continue Synthroid    Persistent atrial fibrillation -Continue Xarelto .  Per cardiology discontinue Tikosyn /D-Forte [employer] due to prolonged QTc and AKI.  Will not resume upon discharge per their recommendations.  Patient will follow-up outpatient in A-fib clinic  GERD Continue Protonix    Obstructive sleep apnea Continue BiPAP   Class 1 Obesity -Low calorie diet, portion control and increase physical activity discussed with patient -Body mass index is 31.68 kg/m.   DVT prophylaxis: Xarelto     Code Status: Full Code Family Communication:   Status is: Inpatient Remains inpatient appropriate because: No complaints wants to go home  PT Follow up Recs:   Subjective: Overall feeling great wishing to go home   Examination:  General exam: Appears calm and comfortable  Respiratory system: Clear to auscultation. Respiratory effort normal. Cardiovascular system: S1 & S2 heard, RRR. No JVD, murmurs, rubs, gallops or clicks. No pedal edema. Gastrointestinal system: Abdomen is nondistended, soft and nontender. No organomegaly or masses felt. Normal bowel sounds heard. Central nervous system: Alert and oriented. No focal neurological deficits. Extremities: Symmetric 5 x 5 power. Skin: No rashes, lesions or ulcers Psychiatry: Judgement and insight appear normal. Mood & affect appropriate.

## 2024-10-03 ENCOUNTER — Encounter (HOSPITAL_COMMUNITY): Payer: Self-pay

## 2024-10-03 ENCOUNTER — Telehealth: Payer: Self-pay

## 2024-10-03 ENCOUNTER — Inpatient Hospital Stay (HOSPITAL_COMMUNITY)
Admission: RE | Admit: 2024-10-03 | Discharge: 2024-10-03 | Disposition: A | Discharge status: Home / Self Care | Source: Ambulatory Visit

## 2024-10-03 DIAGNOSIS — A419 Sepsis, unspecified organism: Secondary | ICD-10-CM

## 2024-10-03 DIAGNOSIS — J9611 Chronic respiratory failure with hypoxia: Secondary | ICD-10-CM

## 2024-10-03 DIAGNOSIS — J9621 Acute and chronic respiratory failure with hypoxia: Secondary | ICD-10-CM | POA: Diagnosis not present

## 2024-10-03 DIAGNOSIS — J4489 Other specified chronic obstructive pulmonary disease: Secondary | ICD-10-CM

## 2024-10-03 DIAGNOSIS — J441 Chronic obstructive pulmonary disease with (acute) exacerbation: Secondary | ICD-10-CM | POA: Diagnosis not present

## 2024-10-03 DIAGNOSIS — E66811 Obesity, class 1: Secondary | ICD-10-CM | POA: Diagnosis not present

## 2024-10-03 DIAGNOSIS — I4819 Other persistent atrial fibrillation: Secondary | ICD-10-CM | POA: Diagnosis not present

## 2024-10-03 LAB — GLUCOSE, CAPILLARY
Glucose-Capillary: 219 mg/dL — ABNORMAL HIGH (ref 70–99)
Glucose-Capillary: 302 mg/dL — ABNORMAL HIGH (ref 70–99)
Glucose-Capillary: 325 mg/dL — ABNORMAL HIGH (ref 70–99)
Glucose-Capillary: 391 mg/dL — ABNORMAL HIGH (ref 70–99)
Glucose-Capillary: 358 mg/dL — ABNORMAL HIGH (ref 70–99)

## 2024-10-03 LAB — BASIC METABOLIC PANEL WITH GFR
Anion gap: 13 (ref 5–15)
BUN: 47 mg/dL — ABNORMAL HIGH (ref 8–23)
CO2: 24 mmol/L (ref 22–32)
Calcium: 8.8 mg/dL — ABNORMAL LOW (ref 8.9–10.3)
Chloride: 102 mmol/L (ref 98–111)
Creatinine, Ser: 1.35 mg/dL — ABNORMAL HIGH (ref 0.61–1.24)
GFR, Estimated: 54 mL/min — ABNORMAL LOW (ref >60)
Glucose, Bld: 230 mg/dL — ABNORMAL HIGH (ref 70–99)
Potassium: 5 mmol/L (ref 3.5–5.1)
Sodium: 139 mmol/L (ref 135–145)

## 2024-10-03 LAB — PHOSPHORUS: Phosphorus: 5.1 mg/dL — ABNORMAL HIGH (ref 2.5–4.6)

## 2024-10-03 LAB — MAGNESIUM: Magnesium: 2.8 mg/dL — ABNORMAL HIGH (ref 1.7–2.4)

## 2024-10-03 MED ORDER — RIVAROXABAN 20 MG PO TABS
20.0000 mg | ORAL_TABLET | Freq: Every day | ORAL | Status: DC
  Administered 2024-10-03: 20 mg via ORAL
  Filled 2024-10-03: qty 1

## 2024-10-03 MED ORDER — FUROSEMIDE 40 MG PO TABS
60.0000 mg | ORAL_TABLET | Freq: Every day | ORAL | Status: DC
  Administered 2024-10-04: 60 mg via ORAL
  Filled 2024-10-03: qty 1

## 2024-10-03 MED ORDER — INSULIN ASPART 100 UNIT/ML IJ SOLN
5.0000 [IU] | Freq: Three times a day (TID) | INTRAMUSCULAR | Status: DC
  Administered 2024-10-03: 5 [IU] via SUBCUTANEOUS

## 2024-10-03 MED ORDER — INSULIN GLARGINE 100 UNIT/ML ~~LOC~~ SOLN
15.0000 [IU] | Freq: Every day | SUBCUTANEOUS | Status: DC
  Administered 2024-10-03: 15 [IU] via SUBCUTANEOUS
  Filled 2024-10-03 (×2): qty 0.15

## 2024-10-03 NOTE — Progress Notes (Signed)
 Heart Failure Navigator Progress Note  Assessed for Heart & Vascular TOC clinic readiness.  Patient does not meet criteria due to current [facility] patient of Dr. Ezra Shuck, MD. [facility] follow-up appointment scheduled for 10/10/24 12:00 @ the [facility]. Patient called at [facility] to confirm he received appointment details and his phone line was busy.  Will attempt to call again. Appointment details added to his AVS for discharge.  Navigator will sign off at this time.  Charmaine Pines, RN, BSN [facility], [facility], [facility] Heart Failure Navigator Secure Chat Only

## 2024-10-03 NOTE — Progress Notes (Signed)
   10/03/24 2235  BiPAP/CPAP/SIPAP  BiPAP/CPAP/SIPAP Pt Type Adult  BiPAP/CPAP/SIPAP DREAMSTATIOND  Mask Type Full face mask  Dentures removed? Yes - Placed in denture cup  Mask Size Medium  Respiratory Rate 18 breaths/min  Flow Rate 4.5 lpm  Patient Home Machine No  Patient Home Mask No  Patient Home Tubing No  Auto Titrate Yes  Minimum cmH2O 5 cmH2O  Maximum cmH2O 20 cmH2O  Device Plugged into RED Power Outlet Yes  BiPAP/CPAP /SiPAP Vitals  Pulse Rate 83  SpO2 97 %  Bilateral Breath Sounds Coarse crackles;Diminished  MEWS Score/Color  MEWS Score 0  MEWS Score Color [place]

## 2024-10-03 NOTE — Plan of Care (Signed)

## 2024-10-03 NOTE — Telephone Encounter (Signed)
 Copied from CRM [id number]. Topic: Clinical - Order For Equipment >> Oct 03, 2024  1:18 PM Clarence Dawson wrote: Reason for CRM: patient is currently hospitalized and is being sent home with 4.5 L of O2 and [facility] is stating he needs Dawson prescription for upgraded oxygen  generator that can carry 4L or more - patient is being discharged tomorrow 10/04/2024

## 2024-10-03 NOTE — Telephone Encounter (Signed)
 I have sent the order to [employer] but I don't know if it is correct or note

## 2024-10-03 NOTE — Plan of Care (Signed)
   Problem: Education: Goal: Knowledge of General Education information will improve Description Including pain rating scale, medication(s)/side effects and non-pharmacologic comfort measures Outcome: Progressing   Problem: Education: Goal: Knowledge of General Education information will improve Description Including pain rating scale, medication(s)/side effects and non-pharmacologic comfort measures Outcome: Progressing

## 2024-10-03 NOTE — Progress Notes (Signed)
 PROGRESS NOTE  Clarence Dawson FMW:[id number] DOB: 07/21/1946 DOA: 09/30/2024 PCP: Sheryle Carwin, MD  Brief History:  78 year old male with a history of COPD, bronchiectasis, pulm hypertension, sinus node dysfunction status post PPM, chronic respiratory failure on +4.5 L, persistent atrial fibrillation, and obstructive sleep apnea on CPAP presenting with shortness of breath and respiratory distress.  He presented with oxygen  saturation of 70% on his usual home oxygen .  He was placed on BiPAP in the emergency department.  Chest x-ray showed retrocardiac opacities.  Initial labs showed WBC 16.0, hemoglobin 15.7, platelets 249.  Serum creatinine was 2.10.  PCT 2.29.  Blood cultures were obtained and the patient was started on ceftriaxone  and doxycycline . His hospitalization has been prolonged secondary to slow improvement.   Assessment/Plan:  1)Acute on chronic hypoxic respiratory failure  - due to pneumonia and COPD exacerbation - At home uses 4 to 4-1/2 L of oxygen  via nasal cannula -On admission O2 sat was in the 70s despite oxygen  supplementation patient required BiPAP -Lactic acidosis possibly due to hypoxia in the setting of pneumonia and COPD flare -Lactic acid 7.6 >>> 5.1 >>4.8    2)Sepsis due to  CAP-- POA WBC 16.8 >>13.5, PCT 2.29   -Blood cultures pending --Rocephin /doxycycline , bronchodilators and mucolytics for pneumonia -Continue Mucinex , incentive spirometry, flutter valve    3)Acute exacerbation of COPD--due to pneumonia -Management as above in #1 and #2 -iv Solu-Medrol  as ordered - add brovana  - add pulmicort  - start duonebs - add yupelri   4)Chronic HFpEF--elevated proBNP and troponin noted -proBNP 15,139, chest x-ray without CHF type findings -Troponin 71 >>> 70, troponin already flattened.  No CP -No acute EKG changes -Patient received IV Lasix  initially -transitioned to oral Lasix -60 mg daily -he was on Lasix  prior to admission -10/01/2024 Echo with EF  of 55 to 60% diastolic parameters indeterminate, mod decreased RVF, RVSP 60.7 Echocardiogram done on 04/07/2024 showed LVEF 45 to 70%.  No WMA.  G2 DD   5)Partial left lower lobe collapse Chest x-ray showed partial left lower lobe collapse.  Continue incentive parameter   Hyperkalemia - Potassium normalized after hyperkalemia cocktail No acute EKG changes - remain on telemetry   DM2--uncontrolled with hyperglycemia - 10/3 8.3 reflecting uncontrolled DM with hyperglycemia PTA -Continue Jardiance , Lantus  -Use Novolog /Humalog Sliding scale insulin  with  - increase NovoLog  5 units with meals - increase lantus  to 15 units   Acute kidney injury Creatinine 2.03 >>1.99>>1.70>>1.35 -(baseline creatinine at 1.0-1.2) -Monitor BMP   Mixed hyperlipidemia Continue Lipitor, Zetia    Acquired hypothyroidism Continue Synthroid    Persistent atrial fibrillation -Continue Xarelto   Zebeta  to be held at this time due to soft BP -Per cardiologist Stop Tikosyn /dofetilide  due to prolonged QTc and AKI and do NOT restart at discharge -Patient will need to follow-up with A-fib clinic to discuss treatment options for A-fib - GERD Continue Protonix    Obstructive sleep apnea Continue BiPAP   Class 1 Obesity- -Low calorie diet, portion control and increase physical activity discussed with patient -Body mass index is 31.68 kg/m.                 Family Communication:   no Family at bedside   Consultants:  none   Code Status:  FULL    DVT Prophylaxis:  xarelto      Procedures: As Listed in Progress Note Above   Antibiotics: Ceftriaxone  10/3>> Doxy 10/3>>      Subjective: Patient states that yesterday.  He  still has nonproductive cough and chest congestion.  He denies any chest pain, nausea, vomiting or direct abdominal pain.  Objective: Vitals:   10/03/24 0424 10/03/24 0432 10/03/24 0737 10/03/24 0853  BP:  (!) 118/90  120/67  Pulse:  67  89  Resp:  20    Temp:  98.4 F  (36.9 C)  98.1 F (36.7 C)  TempSrc:  Oral  Oral  SpO2:  95% 95% 98%  Weight: 94.8 kg     Height:        Intake/Output Summary (Last 24 hours) at 10/03/2024 1217 Last data filed at 10/03/2024 0900 Gross per 24 hour  Intake 480 ml  Output 600 ml  Net -120 ml   Weight change: 1.314 kg Exam:  General:  Pt is alert, follows commands appropriately, not in acute distress HEENT: No icterus, No thrush, No neck mass, [place]/AT Cardiovascular: RRR, S1/S2, no rubs, no gallops Respiratory: Scattered bilateral rales.  Mild bibasilar wheeze. Abdomen: Soft/+BS, non tender, non distended, no guarding Extremities: 1 + LE edema, No lymphangitis, No petechiae, No rashes, no synovitis   Data Reviewed: I have personally reviewed following labs and imaging studies Basic Metabolic Panel: Recent Labs  Lab 09/30/24 2031 09/30/24 2105 09/30/24 2340 10/01/24 0325 10/02/24 0416 10/03/24 0411  NA 139 135 134* 140 140 139  K 5.9* 6.0* 4.7 4.5 5.4* 5.0  CL 96* 100 97* 98 103 102  CO2 17*  --  18* 23 26 24   GLUCOSE 295* 286* 400* 335* 226* 230*  BUN 38* 47* 39* 38* 45* 47*  CREATININE 2.08* 2.10* 1.96* 1.99* 1.70* 1.35*  CALCIUM  9.6  --  8.7* 9.1 9.0 8.8*  MG  --   --   --  2.7*  --  2.8*  PHOS  --   --   --  3.6  --  5.1*   Liver Function Tests: Recent Labs  Lab 10/01/24 0325 10/02/24 0416  AST 79* 48*  ALT 77* 74*  ALKPHOS 78 77  BILITOT 0.7 0.5  PROT 6.9 6.3*  ALBUMIN 3.8 3.5   No results for input(s): LIPASE, AMYLASE in the last 168 hours. No results for input(s): AMMONIA in the last 168 hours. Coagulation Profile: No results for input(s): INR, PROTIME in the last 168 hours. CBC: Recent Labs  Lab 09/30/24 2031 09/30/24 2105 10/01/24 0325 10/02/24 0416  WBC 16.8*  --  13.5* 16.0*  NEUTROABS 13.1*  --   --   --   HGB 15.7 15.3 15.4 14.3  HCT 48.9 45.0 46.9 43.4  MCV 93.9  --  93.4 91.8  PLT 249  --  183 197   Cardiac Enzymes: No results for input(s): CKTOTAL,  CKMB, CKMBINDEX, TROPONINI in the last 168 hours. BNP: Invalid input(s): POCBNP CBG: Recent Labs  Lab 10/02/24 1122 10/02/24 1626 10/02/24 2108 10/03/24 0739 10/03/24 1127  GLUCAP 290* 256* 291* 219* 302*   HbA1C: Recent Labs    09/30/24 2031  HGBA1C 8.3*   Urine analysis:    Component Value Date/Time   COLORURINE YELLOW 09/30/2024 2230   APPEARANCEUR CLEAR 09/30/2024 2230   LABSPEC 1.016 09/30/2024 2230   PHURINE 5.0 09/30/2024 2230   GLUCOSEU >=500 (A) 09/30/2024 2230   HGBUR NEGATIVE 09/30/2024 2230   BILIRUBINUR NEGATIVE 09/30/2024 2230   KETONESUR NEGATIVE 09/30/2024 2230   PROTEINUR 30 (A) 09/30/2024 2230   UROBILINOGEN 1.0 11/15/2012 1051   NITRITE NEGATIVE 09/30/2024 2230   LEUKOCYTESUR NEGATIVE 09/30/2024 2230   Sepsis Labs: @LABRCNTIP (procalcitonin:4,lacticidven:4) )  Recent Results (from the past 240 hours)  Resp panel by RT-PCR (RSV, Flu A&B, Covid) Anterior Nasal Swab     Status: None   Collection Time: 09/30/24  8:31 PM   Specimen: Anterior Nasal Swab  Result Value Ref Range Status   SARS Coronavirus 2 by RT PCR NEGATIVE NEGATIVE Final    Comment: (NOTE) SARS-CoV-2 target nucleic acids are NOT DETECTED.  The SARS-CoV-2 RNA is generally detectable in upper respiratory specimens during the acute phase of infection. The lowest concentration of SARS-CoV-2 viral copies this assay can detect is 138 copies/mL. A negative result does not preclude SARS-Cov-2 infection and should not be used as the sole basis for treatment or other patient management decisions. A negative result may occur with  improper specimen collection/handling, submission of specimen other than nasopharyngeal swab, presence of viral mutation(s) within the areas targeted by this assay, and inadequate number of viral copies(<138 copies/mL). A negative result must be combined with clinical observations, patient history, and epidemiological information. The expected result is  Negative.  Fact Sheet for Patients:  BloggerCourse.com  Fact Sheet for Healthcare Providers:  SeriousBroker.it  This test is no t yet approved or cleared by the [place]  FDA and  has been authorized for detection and/or diagnosis of SARS-CoV-2 by FDA under an Emergency Use Authorization (EUA). This EUA will remain  in effect (meaning this test can be used) for the duration of the COVID-19 declaration under Section 564(b)(1) of the Act, 21 U.S.C.section 360bbb-3(b)(1), unless the authorization is terminated  or revoked sooner.       Influenza A by PCR NEGATIVE NEGATIVE Final   Influenza B by PCR NEGATIVE NEGATIVE Final    Comment: (NOTE) The Xpert Xpress SARS-CoV-2/FLU/RSV plus assay is intended as an aid in the diagnosis of influenza from Nasopharyngeal swab specimens and should not be used as a sole basis for treatment. Nasal washings and aspirates are unacceptable for Xpert Xpress SARS-CoV-2/FLU/RSV testing.  Fact Sheet for Patients: BloggerCourse.com  Fact Sheet for Healthcare Providers: SeriousBroker.it  This test is not yet approved or cleared by the [place]  FDA and has been authorized for detection and/or diagnosis of SARS-CoV-2 by FDA under an Emergency Use Authorization (EUA). This EUA will remain in effect (meaning this test can be used) for the duration of the COVID-19 declaration under Section 564(b)(1) of the Act, 21 U.S.C. section 360bbb-3(b)(1), unless the authorization is terminated or revoked.     Resp Syncytial Virus by PCR NEGATIVE NEGATIVE Final    Comment: (NOTE) Fact Sheet for Patients: BloggerCourse.com  Fact Sheet for Healthcare Providers: SeriousBroker.it  This test is not yet approved or cleared by the [place]  FDA and has been authorized for detection and/or diagnosis of  SARS-CoV-2 by FDA under an Emergency Use Authorization (EUA). This EUA will remain in effect (meaning this test can be used) for the duration of the COVID-19 declaration under Section 564(b)(1) of the Act, 21 U.S.C. section 360bbb-3(b)(1), unless the authorization is terminated or revoked.  Performed at [facility], [address], [place], [place] [address]   Blood culture (routine x 2)     Status: None (Preliminary result)   Collection Time: 09/30/24  9:07 PM   Specimen: BLOOD  Result Value Ref Range Status   Specimen Description BLOOD LEFT ANTECUBITAL  Final   Special Requests   Final    BOTTLES DRAWN AEROBIC AND ANAEROBIC Blood Culture results may not be optimal due to an inadequate volume of blood received in culture bottles  Culture   Final    NO GROWTH 3 DAYS Performed at [facility], [address] [place], [place] [address]    Report Status PENDING  Incomplete  Blood culture (routine x 2)     Status: None (Preliminary result)   Collection Time: 09/30/24  9:42 PM   Specimen: BLOOD  Result Value Ref Range Status   Specimen Description BLOOD BLOOD RIGHT HAND  Final   Special Requests   Final    BOTTLES DRAWN AEROBIC AND ANAEROBIC Blood Culture adequate volume   Culture   Final    NO GROWTH 3 DAYS Performed at [facility], [address] [place], [place] [address]    Report Status PENDING  Incomplete  MRSA Next Gen by PCR, Nasal     Status: None   Collection Time: 09/30/24 10:08 PM   Specimen: Nasal Mucosa; Nasal Swab  Result Value Ref Range Status   MRSA by PCR Next Gen NOT DETECTED NOT DETECTED Final    Comment: (NOTE) The GeneXpert MRSA Assay (FDA approved for NASAL specimens only), is one component of a comprehensive MRSA colonization surveillance program. It is not intended to diagnose MRSA infection nor to guide or monitor treatment for MRSA infections. Test performance is not FDA approved in patients less than 3 years old. Performed at [facility],  [address] [place], [place] [address]   Expectorated Sputum Assessment w Gram Stain, Rflx to Resp Cult     Status: None   Collection Time: 09/30/24 11:39 PM   Specimen: Expectorated Sputum  Result Value Ref Range Status   Specimen Description EXPECTORATED SPUTUM  Final   Special Requests NONE  Final   Sputum evaluation   Final    THIS SPECIMEN IS ACCEPTABLE FOR SPUTUM CULTURE Performed at [facility], [address], [place], [place] [address]    Report Status 10/02/2024 FINAL  Final  Culture, Respiratory w Gram Stain     Status: None (Preliminary result)   Collection Time: 09/30/24 11:39 PM  Result Value Ref Range Status   Specimen Description   Final    EXPECTORATED SPUTUM Performed at [facility], [address], [place], [place] [address]    Special Requests   Final    NONE Reflexed from [id number] Performed at [facility], [address], [place], [place] [address]    Gram Stain   Final    RARE WBC PRESENT, PREDOMINANTLY PMN FEW GRAM NEGATIVE RODS Performed at [facility], [address], [place], [place] [address]    Culture PENDING  Incomplete   Report Status PENDING  Incomplete  Respiratory (~20 pathogens) panel by PCR     Status: None   Collection Time: 10/02/24 10:48 AM   Specimen: Nasopharyngeal Swab; Respiratory  Result Value Ref Range Status   Adenovirus NOT DETECTED NOT DETECTED Final   Coronavirus 229E NOT DETECTED NOT DETECTED Final    Comment: (NOTE) The Coronavirus on the Respiratory Panel, DOES NOT test for the novel  Coronavirus (2019 nCoV)    Coronavirus HKU1 NOT DETECTED NOT DETECTED Final   Coronavirus NL63 NOT DETECTED NOT DETECTED Final   Coronavirus OC43 NOT DETECTED NOT DETECTED Final   Metapneumovirus NOT DETECTED NOT DETECTED Final   Rhinovirus / Enterovirus NOT DETECTED NOT DETECTED Final   Influenza A NOT DETECTED NOT DETECTED Final   Influenza B NOT DETECTED NOT DETECTED Final   Parainfluenza Virus 1 NOT DETECTED NOT DETECTED Final    Parainfluenza Virus 2 NOT DETECTED NOT DETECTED  Final   Parainfluenza Virus 3 NOT DETECTED NOT DETECTED Final   Parainfluenza Virus 4 NOT DETECTED NOT DETECTED Final   Respiratory Syncytial Virus NOT DETECTED NOT DETECTED Final   Bordetella pertussis NOT DETECTED NOT DETECTED Final   Bordetella Parapertussis NOT DETECTED NOT DETECTED Final   Chlamydophila pneumoniae NOT DETECTED NOT DETECTED Final   Mycoplasma pneumoniae NOT DETECTED NOT DETECTED Final    Comment: Performed at [facility], [address] [place], [place] [address]     Scheduled Meds:  albuterol   2.5 mg Nebulization Q6H   arformoterol   15 mcg Nebulization BID   atorvastatin   80 mg Oral Daily   budesonide  (PULMICORT ) nebulizer solution  0.5 mg Nebulization BID   dextromethorphan -guaiFENesin   1 tablet Oral BID   empagliflozin   25 mg Oral QAC breakfast   ezetimibe   10 mg Oral Daily   furosemide   40 mg Oral Daily   insulin  aspart  0-5 Units Subcutaneous QHS   insulin  aspart  0-9 Units Subcutaneous TID WC   insulin  aspart  4 Units Subcutaneous TID WC   insulin  glargine  12 Units Subcutaneous QHS   levothyroxine   150 mcg Oral QAC breakfast   methylPREDNISolone  (SOLU-MEDROL ) injection  60 mg Intravenous Q12H   pantoprazole   40 mg Oral Daily   revefenacin  175 mcg Nebulization Daily   rivaroxaban   15 mg Oral Q supper   Continuous Infusions:  cefTRIAXone  (ROCEPHIN )  IV 2 g (10/02/24 2153)   doxycycline  (VIBRAMYCIN ) IV 100 mg (10/02/24 2112)    Procedures/Studies: ECHOCARDIOGRAM COMPLETE Result Date: 10/01/2024    ECHOCARDIOGRAM REPORT   Patient Name:   Clarence Dawson Date of Exam: 10/01/2024 Medical Rec #:  [id number]       Height:       68.0 in Accession #:    [id number]      Weight:       208.3 lb Date of Birth:  Jun 03, 1946       BSA:          2.080 m Patient Age:    78 years        BP:           97/51 mmHg Patient Gender: M               HR:           74 bpm. Exam Location:  [facility] Procedure: 2D Echo,  Intracardiac Opacification Agent, Color Doppler and Cardiac            Doppler (Both Spectral and Color Flow Doppler were utilized during            procedure). Indications:    CHF- Acute Diastolic I50.31  History:        Patient has prior history of Echocardiogram examinations, most                 recent 06/10/2024. CHF, Pacemaker, COPD and acute respiratory                 distress, Arrythmias:Atrial Fibrillation,                 Signs/Symptoms:Shortness of Breath and Dyspnea; Risk                 Factors:Hypertension, Dyslipidemia, Diabetes and Sleep Apnea.  Sonographer:    Koleen Popper RDCS Referring Phys: [id number] OLADAPO ADEFESO  Sonographer Comments: Suboptimal apical window. Image acquisition challenging due to COPD and Image acquisition challenging due to  respiratory motion. IMPRESSIONS  1. No LV thrombus by Definity. Left ventricular ejection fraction, by estimation, is 55 to 60%. The left ventricle has normal function. Left ventricular endocardial border not optimally defined to evaluate regional wall motion. Left ventricular diastolic parameters are indeterminate.  2. Right ventricular systolic function is moderately reduced. The right ventricular size is severely enlarged. There is severely elevated pulmonary artery systolic pressure. The estimated right ventricular systolic pressure is 60.7 mmHg.  3. The mitral valve is normal in structure. No evidence of mitral valve regurgitation. No evidence of mitral stenosis.  4. The aortic valve was not well visualized. Aortic valve regurgitation is not visualized. Mild to moderate aortic valve stenosis. Aortic valve area, by VTI measures 1.03 cm. Aortic valve mean gradient measures 14.0 mmHg. Aortic valve Vmax measures 2.45 m/s.  5. The inferior vena cava is dilated in size with >50% respiratory variability, suggesting right atrial pressure of 8 mmHg. FINDINGS  Left Ventricle: No LV thrombus by Definity. Left ventricular ejection fraction, by estimation, is 55  to 60%. The left ventricle has normal function. Left ventricular endocardial border not optimally defined to evaluate regional wall motion. Strain was performed and the global longitudinal strain is indeterminate. The left ventricular internal cavity size was normal in size. There is no left ventricular hypertrophy. Left ventricular diastolic parameters are indeterminate. Right Ventricle: The right ventricular size is severely enlarged. No increase in right ventricular wall thickness. Right ventricular systolic function is moderately reduced. There is severely elevated pulmonary artery systolic pressure. The tricuspid regurgitant velocity is 3.63 m/s, and with an assumed right atrial pressure of 8 mmHg, the estimated right ventricular systolic pressure is 60.7 mmHg. Left Atrium: Left atrial size was normal in size. Right Atrium: Right atrial size was normal in size. Pericardium: There is no evidence of pericardial effusion. Mitral Valve: The mitral valve is normal in structure. No evidence of mitral valve regurgitation. No evidence of mitral valve stenosis. Tricuspid Valve: The tricuspid valve is normal in structure. Tricuspid valve regurgitation is not demonstrated. No evidence of tricuspid stenosis. Aortic Valve: The aortic valve was not well visualized. Aortic valve regurgitation is not visualized. Mild to moderate aortic stenosis is present. Aortic valve mean gradient measures 14.0 mmHg. Aortic valve peak gradient measures 23.9 mmHg. Aortic valve area, by VTI measures 1.03 cm. Pulmonic Valve: The pulmonic valve was not well visualized. Pulmonic valve regurgitation is trivial. No evidence of pulmonic stenosis. Aorta: The aortic root is normal in size and structure. Venous: The inferior vena cava is dilated in size with greater than 50% respiratory variability, suggesting right atrial pressure of 8 mmHg. IAS/Shunts: No atrial level shunt detected by color flow Doppler. Additional Comments: 3D was performed not  requiring image post processing on an independent workstation and was indeterminate.  LEFT VENTRICLE PLAX 2D LVIDd:         5.50 cm   Diastology LVIDs:         4.20 cm   LV e' medial:    4.40 cm/s LV PW:         1.10 cm   LV E/e' medial:  17.2 LV IVS:        0.90 cm   LV e' lateral:   11.30 cm/s LVOT diam:     2.00 cm   LV E/e' lateral: 6.7 LV SV:         54 LV SV Index:   26 LVOT Area:     3.14 cm  RIGHT VENTRICLE  IVC RV Basal diam:  5.30 cm     IVC diam: 3.50 cm RV Mid diam:    4.80 cm RV S prime:     13.80 cm/s TAPSE (M-mode): 2.6 cm LEFT ATRIUM           Index        RIGHT ATRIUM           Index LA diam:      4.00 cm 1.92 cm/m   RA Area:     20.60 cm LA Vol (A4C): 63.3 ml 30.44 ml/m  RA Volume:   54.30 ml  26.11 ml/m  AORTIC VALVE AV Area (Vmax):    1.08 cm AV Area (Vmean):   0.97 cm AV Area (VTI):     1.03 cm AV Vmax:           244.67 cm/s AV Vmean:          170.000 cm/s AV VTI:            0.522 m AV Peak Grad:      23.9 mmHg AV Mean Grad:      14.0 mmHg LVOT Vmax:         84.20 cm/s LVOT Vmean:        52.350 cm/s LVOT VTI:          0.172 m LVOT/AV VTI ratio: 0.33  AORTA Ao Root diam: 3.60 cm MITRAL VALVE               TRICUSPID VALVE MV Area (PHT): 3.31 cm    TR Peak grad:   52.7 mmHg MV Decel Time: 229 msec    TR Vmax:        363.00 cm/s MV E velocity: 75.80 cm/s MV A velocity: 56.40 cm/s  SHUNTS MV E/A ratio:  1.34        Systemic VTI:  0.17 m                            Systemic Diam: 2.00 cm Vishnu Priya Mallipeddi Electronically signed by Diannah Late Mallipeddi Signature Date/Time: 10/01/2024/2:11:57 PM    Final    DG Chest Port 1 View Result Date: 09/30/2024 EXAM: 1 VIEW XRAY OF THE CHEST 09/30/2024 09:18:05 PM COMPARISON: 04/06/2024 CLINICAL HISTORY: Dib. Pt bib EMS for respiratory distress. EMS reports called to house for SOB that had gotten worse throughout day. EMS reports pt was on O2 @ 2L nasal cannula (normally wears O2 @ 4.5L) upon their arrival and sats were reading 62%.  EMS placed pt on Cpap and ; reported sats of 75-78%. FINDINGS: LUNGS AND PLEURA: Hypoinflated lungs with retrocardiac opacification likely related to at least partial left lower lobe collapse. Emphysema. Unchanged bibasilar linear opacities. No pleural effusion. No pneumothorax. HEART AND MEDIASTINUM: Stable left chest wall dual lead pacemaker. No acute abnormality of the cardiac and mediastinal silhouettes. BONES AND SOFT TISSUES: No acute osseous abnormality. IMPRESSION: 1. Hypoinflated lungs with retrocardiac opacification likely related to at least partial left lower lobe collapse. 2. Emphysema. Electronically signed by: Dorethia Molt MD 09/30/2024 09:22 PM EDT RP Workstation: [id number]    Alm Schneider, DO  [facility]  If 7PM-7AM, please contact night-coverage [email or web address] Password [email or web address] 10/03/2024, 12:17 PM   LOS: 3 days

## 2024-10-04 LAB — PRO BRAIN NATRIURETIC PEPTIDE: Pro Brain Natriuretic Peptide: 8248 pg/mL — ABNORMAL HIGH (ref <300.0)

## 2024-10-04 LAB — CBC
HCT: 44.4 % (ref 39.0–52.0)
Hemoglobin: 14.4 g/dL (ref 13.0–17.0)
MCH: 30.4 pg (ref 26.0–34.0)
MCHC: 32.4 g/dL (ref 30.0–36.0)
MCV: 93.9 fL (ref 80.0–100.0)
Platelets: 196 K/uL (ref 150–400)
RBC: 4.73 MIL/uL (ref 4.22–5.81)
RDW: 15.6 % — ABNORMAL HIGH (ref 11.5–15.5)
WBC: 9.8 K/uL (ref 4.0–10.5)
nRBC: 0 % (ref 0.0–0.2)

## 2024-10-04 LAB — LEGIONELLA PNEUMOPHILA SEROGP 1 UR AG: L. pneumophila Serogp 1 Ur Ag: NEGATIVE

## 2024-10-04 LAB — GLUCOSE, CAPILLARY
Glucose-Capillary: 253 mg/dL — ABNORMAL HIGH (ref 70–99)
Glucose-Capillary: 329 mg/dL — ABNORMAL HIGH (ref 70–99)

## 2024-10-04 LAB — BASIC METABOLIC PANEL WITH GFR
Anion gap: 13 (ref 5–15)
BUN: 38 mg/dL — ABNORMAL HIGH (ref 8–23)
CO2: 25 mmol/L (ref 22–32)
Calcium: 8.7 mg/dL — ABNORMAL LOW (ref 8.9–10.3)
Chloride: 102 mmol/L (ref 98–111)
Creatinine, Ser: 1.14 mg/dL (ref 0.61–1.24)
GFR, Estimated: >60 mL/min (ref >60)
Glucose, Bld: 244 mg/dL — ABNORMAL HIGH (ref 70–99)
Potassium: 4.5 mmol/L (ref 3.5–5.1)
Sodium: 140 mmol/L (ref 135–145)

## 2024-10-04 LAB — LACTIC ACID, PLASMA: Lactic Acid, Venous: 2.4 mmol/L (ref 0.5–1.9)

## 2024-10-04 MED ORDER — AMOXICILLIN-POT CLAVULANATE 875-125 MG PO TABS: 1.0000 | ORAL_TABLET | Freq: Two times a day (BID) | ORAL | 0 refills | Status: AC

## 2024-10-04 MED ORDER — GLUCAGON HCL RDNA (DIAGNOSTIC) 1 MG IJ SOLR: 1.0000 mg | INTRAMUSCULAR | Status: DC | PRN

## 2024-10-04 MED ORDER — INSULIN ASPART 100 UNIT/ML IJ SOLN
7.0000 [IU] | Freq: Three times a day (TID) | INTRAMUSCULAR | Status: DC
  Administered 2024-10-04 (×2): 7 [IU] via SUBCUTANEOUS

## 2024-10-04 MED ORDER — INSULIN GLARGINE 100 UNIT/ML ~~LOC~~ SOLN
20.0000 [IU] | Freq: Every day | SUBCUTANEOUS | Status: DC
  Filled 2024-10-04: qty 0.2

## 2024-10-04 MED ORDER — METOPROLOL TARTRATE 5 MG/5ML IV SOLN: 5.0000 mg | INTRAVENOUS | Status: DC | PRN

## 2024-10-04 MED ORDER — PREDNISONE 10 MG PO TABS: ORAL_TABLET | ORAL | 0 refills | Status: AC

## 2024-10-04 MED ORDER — HYDRALAZINE HCL 20 MG/ML IJ SOLN: 10.0000 mg | INTRAMUSCULAR | Status: DC | PRN

## 2024-10-04 MED ORDER — DOXYCYCLINE HYCLATE 100 MG PO TABS: 100.0000 mg | ORAL_TABLET | Freq: Two times a day (BID) | ORAL | 0 refills | Status: AC

## 2024-10-04 MED ORDER — BRINSUPRI 25 MG PO TABS: 25.0000 mg | ORAL_TABLET | Freq: Every day | ORAL | 1 refills | Status: DC

## 2024-10-04 NOTE — Plan of Care (Signed)

## 2024-10-04 NOTE — Progress Notes (Signed)
 Mobility Specialist Progress Note:    10/04/24 1342  Mobility  Activity Ambulated with assistance  Level of Assistance Modified independent, requires aide device or extra time  Assistive Device None  Distance Ambulated (ft) 7 ft  Range of Motion/Exercises Active;All extremities  Activity Response Tolerated well  Mobility Referral Yes  Mobility visit 1 Mobility  Mobility Specialist Start Time (ACUTE ONLY) 1342  Mobility Specialist Stop Time (ACUTE ONLY) 1352  Mobility Specialist Time Calculation (min) (ACUTE ONLY) 10 min   Pt received in chair, wife in room. Secretary requesting assistance with d/c. ModI to stand and ambulate with no AD. Tolerated well,asx throughout. Left with wife in vehicle, all needs met.  Breshae Belcher Mobility Specialist Please contact via Special educational needs teacher or  Rehab office at [phone]

## 2024-10-04 NOTE — Telephone Encounter (Signed)
 Called patient to alert of below -   Prescription for Brinsupri 25mg  Take 1 tablet by mouth once daily -  Sent to [facility]: [phone].   Advised that patient contact pharmacy to coordinate shipment if he does not hear from them in the next couple days.   This medication does not replace other respiratory medications. Side effects were similar to placebo group in studies.   Educated to alert office if he has new onset side effects after starting Brinsupri.   Patient verbalizes understanding and agreement with plan.  Aleck Puls, PharmD, BCPS, CPP Clinical Pharmacist  [facility]

## 2024-10-04 NOTE — Progress Notes (Addendum)
Patient discharged home with instructions given on medications,and follow up visits,patient verbalized understanding. Prescriptions sent to Pharmacy of choice documented on AVS. IV discontinued, catheter intact. Accompanied by staff to an awaiting vehicle.

## 2024-10-04 NOTE — Care Management Important Message (Signed)
 Important Message  Patient Details  Name: Clarence Dawson MRN: [id number] Date of Birth: 22-Dec-1946   Important Message Given:  Yes - Medicare IM     Destina Mantei L Haani Bakula 10/04/2024, 10:37 AM

## 2024-10-04 NOTE — Telephone Encounter (Signed)
 Patient was admitted to [facility] from 09/30/24 - 10/04/24.   ATC patient to discuss Brinsupri - he is on his way home from hospital and requests call back later.   Aleck Puls, PharmD, BCPS, CPP Clinical Pharmacist  [facility]

## 2024-10-04 NOTE — Discharge Summary (Signed)
 Physician Discharge Summary  Clarence Dawson FMW:[id number] DOB: [birth year] DOA: 09/30/2024  PCP: Sheryle Carwin, MD  Admit date: 09/30/2024 Discharge date: 10/04/2024  Admitted From: Home Disposition: Home  Recommendations for Outpatient Follow-up:  Follow up with PCP in 1-2 weeks Please obtain BMP/CBC in one week your next doctors visit.  3 more days of p.o. amoxicillin  and doxycycline  Advised to continue to use home bronchodilators Prolonged course of prednisone  taper has been prescribed eventually down to 10 mg daily until seen by outpatient pulmonary towards the end of this month.  Patient has an appointment with Dr. Tamea Discontinue Tikosyn  per discussions by previous provider with cardiology team   Discharge Condition: Stable CODE STATUS: Full code Diet recommendation: Heart healthy  Brief/Interim Summary: Brief Narrative:  78 year old male with a history of COPD, bronchiectasis, pulm hypertension, sinus node dysfunction status post PPM, chronic respiratory failure on +4.5 L, persistent atrial fibrillation, and obstructive sleep apnea on CPAP presenting with shortness of breath and respiratory distress.  He presented with oxygen  saturation of 70% on his usual home oxygen .  He was placed on BiPAP in the emergency department.  Chest x-ray showed retrocardiac opacities.  Initial labs showed WBC 16.0, hemoglobin 15.7, platelets 249.  Serum creatinine was 2.10.  PCT 2.29.  Blood cultures were obtained and the patient was started on ceftriaxone  and doxycycline . His hospitalization has been prolonged secondary to slow improvement.   Today he feels significantly well and wishing to go home.  Assessment & Plan:  Acute on chronic hypoxic respiratory failure  -Due to COPD exacerbation from pneumonia.  Briefly required BiPAP.   Severe sepsis due to  CAP-- POA - Sepsis physiology is improved, repeat blood work is also stable..  Continue Rocephin /doxycycline .  Bronchodilators/I-S/flutter  valve   Acute COPD exacerbation in the setting of pneumonia Chronic hypoxic 4L [place] -Continue steroids, bronchodilators/I-S/flutter valve.  On empiric Rocephin  and doxycycline .  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  Troponins are flat without any chest pain or acute EKG changes.  Initially received IV Lasix  now on p.o. - Echocardiogram October 4025 showed EF 60% with moderately reduced RVEF   Partial left lower lobe collapse Chest x-ray showed partial left lower lobe collapse.  Continue incentive parameter   Hyperkalemia -Resolved   DM2--uncontrolled with hyperglycemia - 10/3 8.3.  Continue Jardiance .  Adjust long-acting, Premeal and sliding scale as appropriate   Acute kidney injury -Baseline creatinine 1.2, upon admission 2.03 now improved   Mixed hyperlipidemia Continue Lipitor, Zetia    Acquired hypothyroidism Continue Synthroid    Persistent atrial fibrillation -Continue Xarelto .  Per cardiology discontinue Tikosyn /D-[employer] due to prolonged QTc and AKI.  Will not resume upon discharge per their recommendations.  Patient will follow-up outpatient in A-fib clinic  GERD Continue Protonix    Obstructive sleep apnea Continue BiPAP   Class 1 Obesity -Low calorie diet, portion control and increase physical activity discussed with patient -Body mass index is 31.68 kg/m.   DVT prophylaxis: Xarelto     Code Status: Full Code Family Communication:   Status is: Inpatient Remains inpatient appropriate because: No complaints wants to go home  PT Follow up Recs:   Subjective: Overall feeling great wishing to go home   Examination:  General exam: Appears calm and comfortable  Respiratory system: Clear to auscultation. Respiratory effort normal. Cardiovascular system: S1 & S2 heard, RRR. No JVD, murmurs, rubs, gallops or clicks. No pedal  edema.  Gastrointestinal system: Abdomen is nondistended, soft and nontender. No organomegaly or masses felt. Normal bowel sounds heard. Central nervous system: Alert and oriented. No focal neurological deficits. Extremities: Symmetric 5 x 5 power. Skin: No rashes, lesions or ulcers Psychiatry: Judgement and insight appear normal. Mood & affect appropriate.    Discharge Diagnoses:  Principal Problem:   Acute on chronic respiratory failure with hypoxia (HCC) Active Problems:   Persistent atrial fibrillation   Mixed hyperlipidemia   OSA on CPAP   Acute exacerbation of COPD with asthma (HCC)   Sepsis due to pneumonia (HCC)   GERD (gastroesophageal reflux disease)   Acute on chronic diastolic CHF (congestive heart failure) (HCC)   Lactic acidosis   Elevated troponin   Acquired hypothyroidism   Hyperkalemia   Type 2 diabetes mellitus with hyperglycemia (HCC)   AKI (acute kidney injury)   Obesity, Class I, BMI 30-34.9   Sepsis due to undetermined organism [facility])      Discharge Exam: Vitals:   10/04/24 0753 10/04/24 0919  BP:  119/69  Pulse:  98  Resp:    Temp:  97.9 F (36.6 C)  SpO2: 94% 96%   Vitals:   10/04/24 0500 10/04/24 0752 10/04/24 0753 10/04/24 0919  BP:    119/69  Pulse:    98  Resp:      Temp:    97.9 F (36.6 C)  TempSrc:    Oral  SpO2:  (!) 88% 94% 96%  Weight: 96.1 kg     Height:          Discharge Instructions  Discharge Instructions     Discharge patient   Complete by: As directed    Discharge disposition: 01-Home or Self Care   Discharge patient date: 10/04/2024      Allergies as of 10/04/2024       Reactions   Food Anaphylaxis, Shortness Of Breath   TREE NUTS   Iodinated Contrast Media Hives, Shortness Of Breath   Patient states hives to throat closing. (01/15/17: patient states this reaction was about 20 years ago with possibly an IVP.  He now says high doses of prednisone  throw me into AFib.  He has tolerated CT arthrograms with  Benadrly 50mg  PO one hour before injection.  Rudolph Loss, RN)   Shellfish Allergy  Anaphylaxis, Shortness Of Breath   To shellfish, crabs.  Makes him feel like things are crawling all over me.  Denies airway issues with these foods.  Arma Loss, RN 01/15/17)   Goat-derived Products Hives   GOAT CHEESE   Prednisone  Palpitations   PRECIPITATES A-FIB   Diclofenac Sodium Other (See Comments)   Hives, buggy feeling all over   Metformin  And Related Diarrhea   High doses at once   Vancomycin  Anxiety   Red man syndrome   Voltaren [diclofenac Sodium] Other (See Comments)   Feels like things are crawling on him        Medication List     STOP taking these medications    dofetilide  250 MCG capsule Commonly known as: TIKOSYN        TAKE these medications    acetaminophen  500 MG tablet Commonly known as: TYLENOL  Take 1,000 mg by mouth 3 (three) times daily.   albuterol  108 (90 Base) MCG/ACT inhaler Commonly known as: VENTOLIN  HFA INHALE 2 PUFFS INTO THE LUNGS EVERY 4 HOURS AS NEEDED, ONLY IF YOU CAN'T CATCH YOUR BREATH   albuterol  (2.5 MG/3ML) 0.083% nebulizer solution Commonly known as: PROVENTIL  INHALE ONE VIAL VIA NEBULIZER  EVERY 6 HOURS AS NEEDED FOR WHEEZING OR SHORTNESS OF BREATH   amoxicillin -clavulanate 875-125 MG tablet Commonly known as: AUGMENTIN  Take 1 tablet by mouth 2 (two) times daily for 3 days.   atorvastatin  80 MG tablet Commonly known as: LIPITOR Take 1 tablet (80 mg total) by mouth daily.   bisoprolol  5 MG tablet Commonly known as: ZEBETA  TAKE 1 AND 1/2 TABLET BY MOUTH TWICE DAILY.   Breztri  Aerosphere 160-9-4.8 MCG/ACT Aero inhaler Generic drug: budesonide -glycopyrrolate -formoterol  INHALE 2 PUFFS INTO THE LUNGS TWICE DAILY.   diltiazem  120 MG 24 hr capsule Commonly known as: CARDIZEM  CD TAKE (1) CAPSULE BY MOUTH DAILY, MAY TAKE A EXTRA CAPSULE DAILY FOR BREAKTHROUGH AFIB.   doxycycline  100 MG tablet Commonly known as: VIBRA -TABS Take 1  tablet (100 mg total) by mouth 2 (two) times daily for 3 days.   Dupixent  300 MG/2ML Soaj Generic drug: Dupilumab  Inject 300 mg into the skin every 14 (fourteen) days.   EPINEPHrine  0.3 mg/0.3 mL Soaj injection Commonly known as: EPI-PEN Inject 0.3 mg into the muscle as needed for anaphylaxis.   ezetimibe  10 MG tablet Commonly known as: ZETIA  Take 1 tablet (10 mg total) by mouth daily.   fluticasone  50 MCG/ACT nasal spray Commonly known as: FLONASE  Place 2 sprays into both nostrils daily.   Flutter Devi Use as directed   furosemide  40 MG tablet Commonly known as: LASIX  Take 1.5 tablets (60 mg total) by mouth 2 (two) times daily.   glipiZIDE  5 MG 24 hr tablet Commonly known as: GLUCOTROL  XL Take 5 mg by mouth daily before breakfast.   HYDROcodone -acetaminophen  5-325 MG tablet Commonly known as: NORCO/VICODIN Take 1 tablet by mouth every 6 (six) hours as needed.   Jardiance  25 MG Tabs tablet Generic drug: empagliflozin  Take 25 mg by mouth daily before breakfast.   Lantus  100 UNIT/ML injection Generic drug: insulin  glargine Inject 10 Units into the skin every morning.   levothyroxine  150 MCG tablet Commonly known as: SYNTHROID  Take 150 mcg by mouth daily before breakfast.   losartan  50 MG tablet Commonly known as: COZAAR  Take 50 mg by mouth at bedtime.   LUBRICATING EYE DROPS OP Place 1 drop into the right eye 2 (two) times a week. Clear eyes as needed What changed:  when to take this reasons to take this   metFORMIN  500 MG 24 hr tablet Commonly known as: GLUCOPHAGE -XR Take 500 mg by mouth in the morning and at bedtime.   multivitamin tablet Take 1 tablet by mouth daily.   Ohtuvayre  3 MG/2.5ML Susp Generic drug: Ensifentrine  Take 3 mg by nebulization 2 (two) times daily.   omeprazole  20 MG tablet Commonly known as: PRILOSEC  OTC Take 20 mg by mouth every morning.   predniSONE  10 MG tablet Commonly known as: DELTASONE  Take 4 tablets (40 mg total) by  mouth daily with breakfast for 5 days, THEN 3 tablets (30 mg total) daily with breakfast for 5 days, THEN 2 tablets (20 mg total) daily with breakfast for 5 days, THEN 1 tablet (10 mg total) daily with breakfast for 7 days. Start taking on: October 04, 2024 What changed: See the new instructions.   PRESCRIPTION MEDICATION 2 (two) times daily. Thumper vest What changed: additional instructions   rivaroxaban  20 MG Tabs tablet Commonly known as: XARELTO  Take 20 mg by mouth at bedtime.   sodium chloride  0.65 % Soln nasal spray Commonly known as: OCEAN Place 1 spray into both nostrils as needed for congestion.   spironolactone  25 MG tablet  Commonly known as: ALDACTONE  Take 0.5 tablets (12.5 mg total) by mouth daily.        Follow-up Information     [facility]. Go on 10/10/2024.   Specialty: Cardiology Why: [facility] Follow-Up 10/10/24 @ 12:00 Please bring all medications to appointment with you. [facility], Entrance C off of [address] [employer] Parking at the door or [employer] 1430 to park under the [employer] information: [address] [place]  [phone]               Allergies  Allergen Reactions   Food Anaphylaxis and Shortness Of Breath    TREE NUTS   Iodinated Contrast Media Hives and Shortness Of Breath    Patient states hives to throat closing. (01/15/17: patient states this reaction was about 20 years ago with possibly an IVP.  He now says high doses of prednisone  throw me into AFib.  He has tolerated CT arthrograms with Benadrly 50mg  PO one hour before injection.  Rudolph Loss, RN)   Shellfish Allergy  Anaphylaxis and Shortness Of Breath    To shellfish, crabs.  Makes him feel like things are crawling all over me.  Denies airway issues with these foods.  Arma Loss, RN 01/15/17)   Goat-Derived Products Hives    GOAT CHEESE    Prednisone   Palpitations    PRECIPITATES A-FIB   Diclofenac Sodium Other (See Comments)    Hives, buggy feeling all over   Metformin  And Related Diarrhea    High doses at once   Vancomycin  Anxiety    Red man syndrome   Voltaren [Diclofenac Sodium] Other (See Comments)    Feels like things are crawling on him    You were cared for by a hospitalist during your hospital stay. If you have any questions about your discharge medications or the care you received while you were in the hospital after you are discharged, you can call the unit and asked to speak with the hospitalist on call if the hospitalist that took care of you is not available. Once you are discharged, your primary care physician will handle any further medical issues. Please note that no refills for any discharge medications will be authorized once you are discharged, as it is imperative that you return to your primary care physician (or establish a relationship with a primary care physician if you do not have one) for your aftercare needs so that they can reassess your need for medications and monitor your lab values.  You were cared for by a hospitalist during your hospital stay. If you have any questions about your discharge medications or the care you received while you were in the hospital after you are discharged, you can call the unit and asked to speak with the hospitalist on call if the hospitalist that took care of you is not available. Once you are discharged, your primary care physician will handle any further medical issues. Please note that NO REFILLS for any discharge medications will be authorized once you are discharged, as it is imperative that you return to your primary care physician (or establish a relationship with a primary care physician if you do not have one) for your aftercare needs so that they can reassess your need for medications and monitor your lab values.  Please request your Prim.MD to go over all Hospital Tests  and Procedure/Radiological results at the follow up, please get all Hospital records sent  to your Prim MD by signing hospital release before you go home.  Get CBC, CMP, 2 view Chest X ray checked  by Primary MD during your next visit or SNF MD in 5-7 days ( we routinely change or add medications that can affect your baseline labs and fluid status, therefore we recommend that you get the mentioned basic workup next visit with your PCP, your PCP may decide not to get them or add new tests based on their clinical decision)  On your next visit with your primary care physician please Get Medicines reviewed and adjusted.  If you experience worsening of your admission symptoms, develop shortness of breath, life threatening emergency, suicidal or homicidal thoughts you must seek medical attention immediately by calling 911 or calling your MD immediately  if symptoms less severe.  You Must read complete instructions/literature along with all the possible adverse reactions/side effects for all the Medicines you take and that have been prescribed to you. Take any new Medicines after you have completely understood and accpet all the possible adverse reactions/side effects.   Do not drive, operate heavy machinery, perform activities at heights, swimming or participation in water  activities or provide baby sitting services if your were admitted for syncope or siezures until you have seen by Primary MD or a Neurologist and advised to do so again.  Do not drive when taking Pain medications.   Procedures/Studies: ECHOCARDIOGRAM COMPLETE Result Date: 10/01/2024    ECHOCARDIOGRAM REPORT   Patient Name:   ABDO DENAULT Date of Exam: 10/01/2024 Medical Rec #:  [id number]       Height:       68.0 in Accession #:    [id number]      Weight:       208.3 lb Date of Birth:  1946-07-16       BSA:          2.080 m Patient Age:    78 years        BP:           97/51 mmHg Patient Gender: M               HR:           74 bpm. Exam  Location:  [facility] Procedure: 2D Echo, Intracardiac Opacification Agent, Color Doppler and Cardiac            Doppler (Both Spectral and Color Flow Doppler were utilized during            procedure). Indications:    CHF- Acute Diastolic I50.31  History:        Patient has prior history of Echocardiogram examinations, most                 recent 06/10/2024. CHF, Pacemaker, COPD and acute respiratory                 distress, Arrythmias:Atrial Fibrillation,                 Signs/Symptoms:Shortness of Breath and Dyspnea; Risk                 Factors:Hypertension, Dyslipidemia, Diabetes and Sleep Apnea.  Sonographer:    Koleen Popper RDCS Referring Phys: [id number] OLADAPO ADEFESO  Sonographer Comments: Suboptimal apical window. Image acquisition challenging due to COPD and Image acquisition challenging due to respiratory motion. IMPRESSIONS  1. No LV thrombus by Definity. Left ventricular ejection fraction, by estimation, is 55 to 60%. The left ventricle has  normal function. Left ventricular endocardial border not optimally defined to evaluate regional wall motion. Left ventricular diastolic parameters are indeterminate.  2. Right ventricular systolic function is moderately reduced. The right ventricular size is severely enlarged. There is severely elevated pulmonary artery systolic pressure. The estimated right ventricular systolic pressure is 60.7 mmHg.  3. The mitral valve is normal in structure. No evidence of mitral valve regurgitation. No evidence of mitral stenosis.  4. The aortic valve was not well visualized. Aortic valve regurgitation is not visualized. Mild to moderate aortic valve stenosis. Aortic valve area, by VTI measures 1.03 cm. Aortic valve mean gradient measures 14.0 mmHg. Aortic valve Vmax measures 2.45 m/s.  5. The inferior vena cava is dilated in size with >50% respiratory variability, suggesting right atrial pressure of 8 mmHg. FINDINGS  Left Ventricle: No LV thrombus by Definity. Left  ventricular ejection fraction, by estimation, is 55 to 60%. The left ventricle has normal function. Left ventricular endocardial border not optimally defined to evaluate regional wall motion. Strain was performed and the global longitudinal strain is indeterminate. The left ventricular internal cavity size was normal in size. There is no left ventricular hypertrophy. Left ventricular diastolic parameters are indeterminate. Right Ventricle: The right ventricular size is severely enlarged. No increase in right ventricular wall thickness. Right ventricular systolic function is moderately reduced. There is severely elevated pulmonary artery systolic pressure. The tricuspid regurgitant velocity is 3.63 m/s, and with an assumed right atrial pressure of 8 mmHg, the estimated right ventricular systolic pressure is 60.7 mmHg. Left Atrium: Left atrial size was normal in size. Right Atrium: Right atrial size was normal in size. Pericardium: There is no evidence of pericardial effusion. Mitral Valve: The mitral valve is normal in structure. No evidence of mitral valve regurgitation. No evidence of mitral valve stenosis. Tricuspid Valve: The tricuspid valve is normal in structure. Tricuspid valve regurgitation is not demonstrated. No evidence of tricuspid stenosis. Aortic Valve: The aortic valve was not well visualized. Aortic valve regurgitation is not visualized. Mild to moderate aortic stenosis is present. Aortic valve mean gradient measures 14.0 mmHg. Aortic valve peak gradient measures 23.9 mmHg. Aortic valve area, by VTI measures 1.03 cm. Pulmonic Valve: The pulmonic valve was not well visualized. Pulmonic valve regurgitation is trivial. No evidence of pulmonic stenosis. Aorta: The aortic root is normal in size and structure. Venous: The inferior vena cava is dilated in size with greater than 50% respiratory variability, suggesting right atrial pressure of 8 mmHg. IAS/Shunts: No atrial level shunt detected by color flow  Doppler. Additional Comments: 3D was performed not requiring image post processing on an independent workstation and was indeterminate.  LEFT VENTRICLE PLAX 2D LVIDd:         5.50 cm   Diastology LVIDs:         4.20 cm   LV e' medial:    4.40 cm/s LV PW:         1.10 cm   LV E/e' medial:  17.2 LV IVS:        0.90 cm   LV e' lateral:   11.30 cm/s LVOT diam:     2.00 cm   LV E/e' lateral: 6.7 LV SV:         54 LV SV Index:   26 LVOT Area:     3.14 cm  RIGHT VENTRICLE             IVC RV Basal diam:  5.30 cm     IVC diam: 3.50  cm RV Mid diam:    4.80 cm RV S prime:     13.80 cm/s TAPSE (M-mode): 2.6 cm LEFT ATRIUM           Index        RIGHT ATRIUM           Index LA diam:      4.00 cm 1.92 cm/m   RA Area:     20.60 cm LA Vol (A4C): 63.3 ml 30.44 ml/m  RA Volume:   54.30 ml  26.11 ml/m  AORTIC VALVE AV Area (Vmax):    1.08 cm AV Area (Vmean):   0.97 cm AV Area (VTI):     1.03 cm AV Vmax:           244.67 cm/s AV Vmean:          170.000 cm/s AV VTI:            0.522 m AV Peak Grad:      23.9 mmHg AV Mean Grad:      14.0 mmHg LVOT Vmax:         84.20 cm/s LVOT Vmean:        52.350 cm/s LVOT VTI:          0.172 m LVOT/AV VTI ratio: 0.33  AORTA Ao Root diam: 3.60 cm MITRAL VALVE               TRICUSPID VALVE MV Area (PHT): 3.31 cm    TR Peak grad:   52.7 mmHg MV Decel Time: 229 msec    TR Vmax:        363.00 cm/s MV E velocity: 75.80 cm/s MV A velocity: 56.40 cm/s  SHUNTS MV E/A ratio:  1.34        Systemic VTI:  0.17 m                            Systemic Diam: 2.00 cm Vishnu Priya Mallipeddi Electronically signed by Diannah Late Mallipeddi Signature Date/Time: 10/01/2024/2:11:57 PM    Final    DG Chest Port 1 View Result Date: 09/30/2024 EXAM: 1 VIEW XRAY OF THE CHEST 09/30/2024 09:18:05 PM COMPARISON: 04/06/2024 CLINICAL HISTORY: Dib. Pt bib EMS for respiratory distress. EMS reports called to house for SOB that had gotten worse throughout day. EMS reports pt was on O2 @ 2L nasal cannula (normally wears O2 @  4.5L) upon their arrival and sats were reading 62%. EMS placed pt on Cpap and ; reported sats of 75-78%. FINDINGS: LUNGS AND PLEURA: Hypoinflated lungs with retrocardiac opacification likely related to at least partial left lower lobe collapse. Emphysema. Unchanged bibasilar linear opacities. No pleural effusion. No pneumothorax. HEART AND MEDIASTINUM: Stable left chest wall dual lead pacemaker. No acute abnormality of the cardiac and mediastinal silhouettes. BONES AND SOFT TISSUES: No acute osseous abnormality. IMPRESSION: 1. Hypoinflated lungs with retrocardiac opacification likely related to at least partial left lower lobe collapse. 2. Emphysema. Electronically signed by: Dorethia Molt MD 09/30/2024 09:22 PM EDT RP Workstation: [id number]     The results of significant diagnostics from this hospitalization (including imaging, microbiology, ancillary and laboratory) are listed below for reference.     Microbiology: Recent Results (from the past 240 hours)  Resp panel by RT-PCR (RSV, Flu A&B, Covid) Anterior Nasal Swab     Status: None   Collection Time: 09/30/24  8:31 PM   Specimen: Anterior Nasal Swab  Result Value Ref Range Status  SARS Coronavirus 2 by RT PCR NEGATIVE NEGATIVE Final    Comment: (NOTE) SARS-CoV-2 target nucleic acids are NOT DETECTED.  The SARS-CoV-2 RNA is generally detectable in upper respiratory specimens during the acute phase of infection. The lowest concentration of SARS-CoV-2 viral copies this assay can detect is 138 copies/mL. A negative result does not preclude SARS-Cov-2 infection and should not be used as the sole basis for treatment or other patient management decisions. A negative result may occur with  improper specimen collection/handling, submission of specimen other than nasopharyngeal swab, presence of viral mutation(s) within the areas targeted by this assay, and inadequate number of viral copies(<138 copies/mL). A negative result must be combined  with clinical observations, patient history, and epidemiological information. The expected result is Negative.  Fact Sheet for Patients:  BloggerCourse.com  Fact Sheet for Healthcare Providers:  SeriousBroker.it  This test is no t yet approved or cleared by the [place]  FDA and  has been authorized for detection and/or diagnosis of SARS-CoV-2 by FDA under an Emergency Use Authorization (EUA). This EUA will remain  in effect (meaning this test can be used) for the duration of the COVID-19 declaration under Section 564(b)(1) of the Act, 21 U.S.C.section 360bbb-3(b)(1), unless the authorization is terminated  or revoked sooner.       Influenza A by PCR NEGATIVE NEGATIVE Final   Influenza B by PCR NEGATIVE NEGATIVE Final    Comment: (NOTE) The Xpert Xpress SARS-CoV-2/FLU/RSV plus assay is intended as an aid in the diagnosis of influenza from Nasopharyngeal swab specimens and should not be used as a sole basis for treatment. Nasal washings and aspirates are unacceptable for Xpert Xpress SARS-CoV-2/FLU/RSV testing.  Fact Sheet for Patients: BloggerCourse.com  Fact Sheet for Healthcare Providers: SeriousBroker.it  This test is not yet approved or cleared by the [place]  FDA and has been authorized for detection and/or diagnosis of SARS-CoV-2 by FDA under an Emergency Use Authorization (EUA). This EUA will remain in effect (meaning this test can be used) for the duration of the COVID-19 declaration under Section 564(b)(1) of the Act, 21 U.S.C. section 360bbb-3(b)(1), unless the authorization is terminated or revoked.     Resp Syncytial Virus by PCR NEGATIVE NEGATIVE Final    Comment: (NOTE) Fact Sheet for Patients: BloggerCourse.com  Fact Sheet for Healthcare Providers: SeriousBroker.it  This test is not yet approved  or cleared by the [place]  FDA and has been authorized for detection and/or diagnosis of SARS-CoV-2 by FDA under an Emergency Use Authorization (EUA). This EUA will remain in effect (meaning this test can be used) for the duration of the COVID-19 declaration under Section 564(b)(1) of the Act, 21 U.S.C. section 360bbb-3(b)(1), unless the authorization is terminated or revoked.  Performed at [facility], [address], [place], [place] [address]   Blood culture (routine x 2)     Status: None (Preliminary result)   Collection Time: 09/30/24  9:07 PM   Specimen: BLOOD  Result Value Ref Range Status   Specimen Description BLOOD LEFT ANTECUBITAL  Final   Special Requests   Final    BOTTLES DRAWN AEROBIC AND ANAEROBIC Blood Culture results may not be optimal due to an inadequate volume of blood received in culture bottles   Culture   Final    NO GROWTH 4 DAYS Performed at [facility], [address] [place], [place] [address]    Report Status PENDING  Incomplete  Blood culture (routine x 2)     Status: None (Preliminary result)  Collection Time: 09/30/24  9:42 PM   Specimen: BLOOD  Result Value Ref Range Status   Specimen Description BLOOD BLOOD RIGHT HAND  Final   Special Requests   Final    BOTTLES DRAWN AEROBIC AND ANAEROBIC Blood Culture adequate volume   Culture   Final    NO GROWTH 4 DAYS Performed at [facility], [address] [place], [place] [address]    Report Status PENDING  Incomplete  MRSA Next Gen by PCR, Nasal     Status: None   Collection Time: 09/30/24 10:08 PM   Specimen: Nasal Mucosa; Nasal Swab  Result Value Ref Range Status   MRSA by PCR Next Gen NOT DETECTED NOT DETECTED Final    Comment: (NOTE) The GeneXpert MRSA Assay (FDA approved for NASAL specimens only), is one component of a comprehensive MRSA colonization surveillance program. It is not intended to diagnose MRSA infection nor to guide or monitor treatment for MRSA infections. Test  performance is not FDA approved in patients less than 60 years old. Performed at [facility], [address], [place], [place] [address]   Expectorated Sputum Assessment w Gram Stain, Rflx to Resp Cult     Status: None   Collection Time: 09/30/24 11:39 PM   Specimen: Expectorated Sputum  Result Value Ref Range Status   Specimen Description EXPECTORATED SPUTUM  Final   Special Requests NONE  Final   Sputum evaluation   Final    THIS SPECIMEN IS ACCEPTABLE FOR SPUTUM CULTURE Performed at [facility], [address], [place], [place] [address]    Report Status 10/02/2024 FINAL  Final  Culture, Respiratory w Gram Stain     Status: None (Preliminary result)   Collection Time: 09/30/24 11:39 PM  Result Value Ref Range Status   Specimen Description   Final    EXPECTORATED SPUTUM Performed at [facility], [address] [place], [place] [address]    Special Requests   Final    NONE Reflexed from [id number] Performed at [facility], [address], [place], [place] [address]    Gram Stain   Final    RARE WBC PRESENT, PREDOMINANTLY PMN FEW GRAM NEGATIVE RODS    Culture   Final    TOO YOUNG TO READ Performed at [facility], [address], [place], [place] [address]    Report Status PENDING  Incomplete  Respiratory (~20 pathogens) panel by PCR     Status: None   Collection Time: 10/02/24 10:48 AM   Specimen: Nasopharyngeal Swab; Respiratory  Result Value Ref Range Status   Adenovirus NOT DETECTED NOT DETECTED Final   Coronavirus 229E NOT DETECTED NOT DETECTED Final    Comment: (NOTE) The Coronavirus on the Respiratory Panel, DOES NOT test for the novel  Coronavirus (2019 nCoV)    Coronavirus HKU1 NOT DETECTED NOT DETECTED Final   Coronavirus NL63 NOT DETECTED NOT DETECTED Final   Coronavirus OC43 NOT DETECTED NOT DETECTED Final   Metapneumovirus NOT DETECTED NOT DETECTED Final   Rhinovirus / Enterovirus NOT DETECTED NOT DETECTED Final   Influenza A NOT DETECTED NOT DETECTED  Final   Influenza B NOT DETECTED NOT DETECTED Final   Parainfluenza Virus 1 NOT DETECTED NOT DETECTED Final   Parainfluenza Virus 2 NOT DETECTED NOT DETECTED Final   Parainfluenza Virus 3 NOT DETECTED NOT DETECTED Final   Parainfluenza Virus 4 NOT DETECTED NOT DETECTED Final   Respiratory Syncytial Virus NOT DETECTED NOT DETECTED Final   Bordetella pertussis NOT DETECTED NOT  DETECTED Final   Bordetella Parapertussis NOT DETECTED NOT DETECTED Final   Chlamydophila pneumoniae NOT DETECTED NOT DETECTED Final   Mycoplasma pneumoniae NOT DETECTED NOT DETECTED Final    Comment: Performed at [facility], [address], [place], [place] [address]     Labs: BNP (last 3 results) Recent Labs    12/16/23 1210 04/06/24 1732 06/10/24 1507  BNP 256.5* 1,169.0* 529.8*   Basic Metabolic Panel: Recent Labs  Lab 09/30/24 2340 10/01/24 0325 10/02/24 0416 10/03/24 0411 10/04/24 0828  NA 134* 140 140 139 140  K 4.7 4.5 5.4* 5.0 4.5  CL 97* 98 103 102 102  CO2 18* 23 26 24 25   GLUCOSE 400* 335* 226* 230* 244*  BUN 39* 38* 45* 47* 38*  CREATININE 1.96* 1.99* 1.70* 1.35* 1.14  CALCIUM  8.7* 9.1 9.0 8.8* 8.7*  MG  --  2.7*  --  2.8*  --   PHOS  --  3.6  --  5.1*  --    Liver Function Tests: Recent Labs  Lab 10/01/24 0325 10/02/24 0416  AST 79* 48*  ALT 77* 74*  ALKPHOS 78 77  BILITOT 0.7 0.5  PROT 6.9 6.3*  ALBUMIN 3.8 3.5   No results for input(s): LIPASE, AMYLASE in the last 168 hours. No results for input(s): AMMONIA in the last 168 hours. CBC: Recent Labs  Lab 09/30/24 2031 09/30/24 2105 10/01/24 0325 10/02/24 0416 10/04/24 0828  WBC 16.8*  --  13.5* 16.0* 9.8  NEUTROABS 13.1*  --   --   --   --   HGB 15.7 15.3 15.4 14.3 14.4  HCT 48.9 45.0 46.9 43.4 44.4  MCV 93.9  --  93.4 91.8 93.9  PLT 249  --  183 197 196   Cardiac Enzymes: No results for input(s): CKTOTAL, CKMB, CKMBINDEX, TROPONINI in the last 168 hours. BNP: Invalid input(s):  POCBNP CBG: Recent Labs  Lab 10/03/24 1641 10/03/24 2024 10/03/24 2107 10/04/24 0723 10/04/24 1117  GLUCAP 325* 391* 358* 253* 329*   D-Dimer No results for input(s): DDIMER in the last 72 hours. Hgb A1c No results for input(s): HGBA1C in the last 72 hours. Lipid Profile No results for input(s): CHOL, HDL, LDLCALC, TRIG, CHOLHDL, LDLDIRECT in the last 72 hours. Thyroid  function studies No results for input(s): TSH, T4TOTAL, T3FREE, THYROIDAB in the last 72 hours.  Invalid input(s): FREET3 Anemia work up No results for input(s): VITAMINB12, FOLATE, FERRITIN, TIBC, IRON, RETICCTPCT in the last 72 hours. Urinalysis    Component Value Date/Time   COLORURINE YELLOW 09/30/2024 2230   APPEARANCEUR CLEAR 09/30/2024 2230   LABSPEC 1.016 09/30/2024 2230   PHURINE 5.0 09/30/2024 2230   GLUCOSEU >=500 (A) 09/30/2024 2230   HGBUR NEGATIVE 09/30/2024 2230   BILIRUBINUR NEGATIVE 09/30/2024 2230   KETONESUR NEGATIVE 09/30/2024 2230   PROTEINUR 30 (A) 09/30/2024 2230   UROBILINOGEN 1.0 11/15/2012 1051   NITRITE NEGATIVE 09/30/2024 2230   LEUKOCYTESUR NEGATIVE 09/30/2024 2230   Sepsis Labs Recent Labs  Lab 09/30/24 2031 10/01/24 0325 10/02/24 0416 10/04/24 0828  WBC 16.8* 13.5* 16.0* 9.8   Microbiology Recent Results (from the past 240 hours)  Resp panel by RT-PCR (RSV, Flu A&B, Covid) Anterior Nasal Swab     Status: None   Collection Time: 09/30/24  8:31 PM   Specimen: Anterior Nasal Swab  Result Value Ref Range Status   SARS Coronavirus 2 by RT PCR NEGATIVE NEGATIVE Final    Comment: (NOTE) SARS-CoV-2 target nucleic acids are NOT  DETECTED.  The SARS-CoV-2 RNA is generally detectable in upper respiratory specimens during the acute phase of infection. The lowest concentration of SARS-CoV-2 viral copies this assay can detect is 138 copies/mL. A negative result does not preclude SARS-Cov-2 infection and should not be used as the sole  basis for treatment or other patient management decisions. A negative result may occur with  improper specimen collection/handling, submission of specimen other than nasopharyngeal swab, presence of viral mutation(s) within the areas targeted by this assay, and inadequate number of viral copies(<138 copies/mL). A negative result must be combined with clinical observations, patient history, and epidemiological information. The expected result is Negative.  Fact Sheet for Patients:  BloggerCourse.com  Fact Sheet for Healthcare Providers:  SeriousBroker.it  This test is no t yet approved or cleared by the [place]  FDA and  has been authorized for detection and/or diagnosis of SARS-CoV-2 by FDA under an Emergency Use Authorization (EUA). This EUA will remain  in effect (meaning this test can be used) for the duration of the COVID-19 declaration under Section 564(b)(1) of the Act, 21 U.S.C.section 360bbb-3(b)(1), unless the authorization is terminated  or revoked sooner.       Influenza A by PCR NEGATIVE NEGATIVE Final   Influenza B by PCR NEGATIVE NEGATIVE Final    Comment: (NOTE) The Xpert Xpress SARS-CoV-2/FLU/RSV plus assay is intended as an aid in the diagnosis of influenza from Nasopharyngeal swab specimens and should not be used as a sole basis for treatment. Nasal washings and aspirates are unacceptable for Xpert Xpress SARS-CoV-2/FLU/RSV testing.  Fact Sheet for Patients: BloggerCourse.com  Fact Sheet for Healthcare Providers: SeriousBroker.it  This test is not yet approved or cleared by the [place]  FDA and has been authorized for detection and/or diagnosis of SARS-CoV-2 by FDA under an Emergency Use Authorization (EUA). This EUA will remain in effect (meaning this test can be used) for the duration of the COVID-19 declaration under Section 564(b)(1) of the Act,  21 U.S.C. section 360bbb-3(b)(1), unless the authorization is terminated or revoked.     Resp Syncytial Virus by PCR NEGATIVE NEGATIVE Final    Comment: (NOTE) Fact Sheet for Patients: BloggerCourse.com  Fact Sheet for Healthcare Providers: SeriousBroker.it  This test is not yet approved or cleared by the [place]  FDA and has been authorized for detection and/or diagnosis of SARS-CoV-2 by FDA under an Emergency Use Authorization (EUA). This EUA will remain in effect (meaning this test can be used) for the duration of the COVID-19 declaration under Section 564(b)(1) of the Act, 21 U.S.C. section 360bbb-3(b)(1), unless the authorization is terminated or revoked.  Performed at [facility], [address], [place], [place] [address]   Blood culture (routine x 2)     Status: None (Preliminary result)   Collection Time: 09/30/24  9:07 PM   Specimen: BLOOD  Result Value Ref Range Status   Specimen Description BLOOD LEFT ANTECUBITAL  Final   Special Requests   Final    BOTTLES DRAWN AEROBIC AND ANAEROBIC Blood Culture results may not be optimal due to an inadequate volume of blood received in culture bottles   Culture   Final    NO GROWTH 4 DAYS Performed at [facility], [address] [place], [place] [address]    Report Status PENDING  Incomplete  Blood culture (routine x 2)     Status: None (Preliminary result)   Collection Time: 09/30/24  9:42 PM   Specimen: BLOOD  Result Value Ref Range Status   Specimen  Description BLOOD BLOOD RIGHT HAND  Final   Special Requests   Final    BOTTLES DRAWN AEROBIC AND ANAEROBIC Blood Culture adequate volume   Culture   Final    NO GROWTH 4 DAYS Performed at [facility], [address], [place], [place] [address]    Report Status PENDING  Incomplete  MRSA Next Gen by PCR, Nasal     Status: None   Collection Time: 09/30/24 10:08 PM   Specimen: Nasal Mucosa; Nasal Swab  Result Value Ref  Range Status   MRSA by PCR Next Gen NOT DETECTED NOT DETECTED Final    Comment: (NOTE) The GeneXpert MRSA Assay (FDA approved for NASAL specimens only), is one component of a comprehensive MRSA colonization surveillance program. It is not intended to diagnose MRSA infection nor to guide or monitor treatment for MRSA infections. Test performance is not FDA approved in patients less than 77 years old. Performed at [facility], [address] [place], [place] [address]   Expectorated Sputum Assessment w Gram Stain, Rflx to Resp Cult     Status: None   Collection Time: 09/30/24 11:39 PM   Specimen: Expectorated Sputum  Result Value Ref Range Status   Specimen Description EXPECTORATED SPUTUM  Final   Special Requests NONE  Final   Sputum evaluation   Final    THIS SPECIMEN IS ACCEPTABLE FOR SPUTUM CULTURE Performed at [facility], [address], [place], [place] [address]    Report Status 10/02/2024 FINAL  Final  Culture, Respiratory w Gram Stain     Status: None (Preliminary result)   Collection Time: 09/30/24 11:39 PM  Result Value Ref Range Status   Specimen Description   Final    EXPECTORATED SPUTUM Performed at [facility], [address] [place], [place] [address]    Special Requests   Final    NONE Reflexed from [id number] Performed at [facility], [address], [place], [place] [address]    Gram Stain   Final    RARE WBC PRESENT, PREDOMINANTLY PMN FEW GRAM NEGATIVE RODS    Culture   Final    TOO YOUNG TO READ Performed at [facility], [address] [place], [place] [address]    Report Status PENDING  Incomplete  Respiratory (~20 pathogens) panel by PCR     Status: None   Collection Time: 10/02/24 10:48 AM   Specimen: Nasopharyngeal Swab; Respiratory  Result Value Ref Range Status   Adenovirus NOT DETECTED NOT DETECTED Final   Coronavirus 229E NOT DETECTED NOT DETECTED Final    Comment: (NOTE) The Coronavirus on the Respiratory Panel, DOES NOT test for the  novel  Coronavirus (2019 nCoV)    Coronavirus HKU1 NOT DETECTED NOT DETECTED Final   Coronavirus NL63 NOT DETECTED NOT DETECTED Final   Coronavirus OC43 NOT DETECTED NOT DETECTED Final   Metapneumovirus NOT DETECTED NOT DETECTED Final   Rhinovirus / Enterovirus NOT DETECTED NOT DETECTED Final   Influenza A NOT DETECTED NOT DETECTED Final   Influenza B NOT DETECTED NOT DETECTED Final   Parainfluenza Virus 1 NOT DETECTED NOT DETECTED Final   Parainfluenza Virus 2 NOT DETECTED NOT DETECTED Final   Parainfluenza Virus 3 NOT DETECTED NOT DETECTED Final   Parainfluenza Virus 4 NOT DETECTED NOT DETECTED Final   Respiratory Syncytial Virus NOT DETECTED NOT DETECTED Final   Bordetella pertussis NOT DETECTED NOT DETECTED Final   Bordetella Parapertussis NOT DETECTED NOT DETECTED Final   Chlamydophila pneumoniae NOT DETECTED NOT DETECTED  Final   Mycoplasma pneumoniae NOT DETECTED NOT DETECTED Final    Comment: Performed at [facility], [address], [place], [place] [address]     Time coordinating discharge:  I have spent 35 minutes face to face with the patient and on the ward discussing the patients care, assessment, plan and disposition with other care givers. >50% of the time was devoted counseling the patient about the risks and benefits of treatment/Discharge disposition and coordinating care.   SIGNED:   Burgess JAYSON Dare, MD  [facility] 10/04/2024, 11:32 AM   If 7PM-7AM, please contact night-coverage

## 2024-10-04 NOTE — TOC Progression Note (Signed)
 Transition of Care [facility]) - Progression Note    Patient Details  Name: Clarence Dawson MRN: [id number] Date of Birth: October 14, 1946  [facility] CM/SW Contact  Lucie Lunger, [place] Phone Number: 10/04/2024, 9:16 AM  Clinical Narrative:    CSW confirmed with rep Jake from [employer] that pt has home O2 with them currently. TOC to follow.   Expected Discharge Plan: Home/Self Care Barriers to Discharge: Continued Medical Work up               Expected Discharge Plan and Services In-house Referral: Clinical Social Work Discharge Planning Services: CM Consult   Living arrangements for the past 2 months: Single Family Home                                       Social Drivers of Health (SDOH) Interventions SDOH Screenings   Food Insecurity: No Food Insecurity (09/30/2024)  Housing: Low Risk  (09/30/2024)  Transportation Needs: No Transportation Needs (09/30/2024)  Utilities: Not At Risk (09/30/2024)  Depression (PHQ2-9): Low Risk  (09/03/2023)  Social Connections: Socially Isolated (09/30/2024)  Tobacco Use: Medium Risk (09/30/2024)    Readmission Risk Interventions    10/01/2024   11:37 AM 04/06/2024   10:08 PM  Readmission Risk Prevention Plan  Post Dischage Appt  Complete  Medication Screening  Complete  Transportation Screening Complete Complete  HRI or Home Care Consult Complete   Social Work Consult for Recovery Care Planning/Counseling Complete   Palliative Care Screening Not Applicable   Medication Review Oceanographer) Complete

## 2024-10-05 ENCOUNTER — Other Ambulatory Visit: Payer: Self-pay

## 2024-10-05 ENCOUNTER — Encounter (INDEPENDENT_AMBULATORY_CARE_PROVIDER_SITE_OTHER): Payer: Self-pay

## 2024-10-05 ENCOUNTER — Other Ambulatory Visit (HOSPITAL_COMMUNITY): Payer: Self-pay

## 2024-10-05 ENCOUNTER — Other Ambulatory Visit: Payer: Self-pay | Admitting: Pulmonary Disease

## 2024-10-05 DIAGNOSIS — J4550 Severe persistent asthma, uncomplicated: Secondary | ICD-10-CM

## 2024-10-05 LAB — CULTURE, BLOOD (ROUTINE X 2)
Culture: NO GROWTH
Culture: NO GROWTH
Special Requests: ADEQUATE

## 2024-10-05 MED ORDER — DUPIXENT 300 MG/2ML ~~LOC~~ SOAJ
300.0000 mg | SUBCUTANEOUS | 2 refills | Status: DC
Start: 2024-10-05 — End: 2024-11-08
  Filled 2024-10-05 (×3): qty 4, 28d supply, fill #0
  Filled 2024-11-03 – 2024-11-04 (×2): qty 4, 28d supply, fill #1

## 2024-10-05 NOTE — Progress Notes (Signed)
 Specialty Pharmacy Refill Coordination Note  MyChart Questionnaire Submission  Clarence Dawson is a 78 y.o. male contacted today regarding refills of specialty medication(s) Dupixent .  Doses on hand: (Patient-Rptd) 0   Injection date: (Patient-Rptd) 10/19/24  Patient requested: (Patient-Rptd) Pickup at [facility] date: 10/14/24  Medication will be filled on 10/13/24.

## 2024-10-05 NOTE — Telephone Encounter (Signed)
 Refill sent for DUPIXENT  to [facility]: [phone]   Dose: 300mg  [place] every 14 days   Last OV: 09/27/24 Provider: Dr. Tamea  Next OV: 11/08/24  Aleck Puls, PharmD, BCPS Clinical Pharmacist  [facility]

## 2024-10-06 LAB — CULTURE, RESPIRATORY W GRAM STAIN

## 2024-10-07 ENCOUNTER — Telehealth: Payer: Self-pay | Admitting: Cardiology

## 2024-10-07 NOTE — Telephone Encounter (Signed)
 Called to confirm/remind patient of [facility] appointment [facility] on 10[facility]10[facility].   Appointment:   [x] Confirmed  [] Left mess   [] No answer/No voice mail  [] VM Full/unable to leave message  [] Phone not in service  Patient reminded to bring all medications and/or complete list.  Confirmed patient has transportation. Gave directions, instructed to utilize valet parking.

## 2024-10-10 ENCOUNTER — Other Ambulatory Visit: Payer: Self-pay | Admitting: Pulmonary Disease

## 2024-10-10 ENCOUNTER — Encounter

## 2024-10-10 NOTE — Progress Notes (Deleted)
 PCP: Sheryle Carwin, MD Cardiology: Dr. Waddell HF Cardiology: Dr. Rolan  78 y.o. with history of paroxysmal atrial fibrillation, COPD and chronic bronchiectasis, and diastolic CHF with prominent RV failure was referred by Dr. Waddell for evaluation of CHF and pulmonary hypertension. Patient is on 3-4 L home oxygen  and uses Bipap at night.  He quit smoking in 1985 but has COPD/chronic bronchitis.  He also has bronchiectasis with history of mucus plugging/lobar collapse.  He had a remote atrial fibrillation ablation and has been on dofetilide .  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   In 4/24, he tripped, fell, and broke several left-sided ribs.  He ended up with left lower lobe collapse due to mucus plugging.  This resolved over time. However, he has been considerably more short of breath since then.  Echo was done in 7/24, showing EF 55-60%, grade 2 diastolic dysfunction, moderate RV enlargement with mild RV dysfunction, PASP 96 mmHg, severe biatrial enlargement, mild-moderate MR, IVC dilated.  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    Echo 6/25 showed  EF 60-65%, D-shaped septum suggestive of RV pressure/volume overload, moderate RV enlargement with mildly decreased RV systolic function, mild aortic stenosis with mean gradient 10 mmHg, moderate TR, PASP 70.   Admitted to [facility] earlier this month with acute on chronic respiratory failure requiring BiPAP in setting of COPDE and pneumonia. Had lactic acidosis, up to 7.6 on presentation. Treated with empiric abx, steroids and nebs. Also felt to have component of acute on chronic HFpEF and was diuresed. Tikosyn  was stopped during admission d/t AKI and prolonged QTc. Echo w/ LVEF 55-60%, RV moderately reduced, RV severely enlarged, RVSP 60 mmHg, mild to moderate AS with mean gradient of 14 mmHg.  Today he returns for HF follow up. Overall feeling  great! He continues to wear 4 L oxygen  & uses BiPap at night. Rarely feels he is in AF, uses a Kardia device. He has SOB walking further distances on flat ground or lifting heavy groceries. He has occasional RLE edema. Wears compression hose. Denies palpitations, abnormal bleeding, CP, dizziness, or PND/Orthopnea. Appetite ok, trying to cut back on water  intake.  Weight at home 214 pounds. Taking all medications. Has productive cough, sputum thick brown color, no fever or chills.    PMH: 1. Atrial fibrillation: Paroxysmal.  Remote AF ablation.  Now on dofetilide .  2. GERD 3. Type 2 diabetes 4. HTN 5. Hypothyroidism 6. Hyperlipidemia 7. COPD: Asthma/COPD overlap syndrome.  Chronic bronchitis.  Quit smoking 1985.  On home oxygen  3-4 L.   8. Chronic bronchiectasis 9. OSA: Uses Bipap 10. 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  - RHC (8/24): mean RA 4, PA 67/23 mean 37, mean PCWP 11, CI 2.13, PVR 5.7 WU - Echo (6/25): EF 60-65%, D-shaped septum suggestive of RV pressure/volume overload, moderate RV enlargement with mildly decreased RV systolic function, mild aortic stenosis with mean gradient 10 mmHg, moderate TR, PASP 70.  - Echo (10/25): LVEF 55-60%, RV moderately reduced, RV severely enlarged, RVSP 60 mmHg, mild to moderate AS with mean gradient of 14 mmHg. 11. Sick sinus syndrome: St Jude PPM 12. CAD: Coronary angiography (8/24) with 50% dLCx, 40% pLAD.  13. Aortic stenosis: Mild on last echo.   Social  History   Socioeconomic History   Marital status: Widowed    Spouse name: Not on file   Number of children: Not on file   Years of education: Not on file   Highest education level: Not on file  Occupational History   Not on file  Tobacco Use   Smoking status: Former    Current packs/day: 0.00     Average packs/day: 3.0 packs/day for 26.0 years (77.9 ttl pk-yrs)    Types: Cigarettes    Start date: 02/20/1958    Quit date: 02/12/1984    Years since quitting: 40.6   Smokeless tobacco: Former    Types: Chew   Tobacco comments:    Former smoker 08/30/2021  Vaping Use   Vaping status: Never Used  Substance and Sexual Activity   Alcohol  use: Yes    Alcohol /week: 2.0 - 4.0 standard drinks of alcohol     Types: 1 - 2 Glasses of wine, 1 - 2 Cans of beer per week    Comment: 1 glass of wine or beer 3 times a week 12/26/22   Drug use: No   Sexual activity: Not Currently  Other Topics Concern   Not on file  Social History Narrative   Regular exercise: No   Social Drivers of Corporate investment banker Strain: Not on file  Food Insecurity: No Food Insecurity (09/30/2024)   Hunger Vital Sign    Worried About Running Out of Food in the Last Year: Never true    Ran Out of Food in the Last Year: Never true  Transportation Needs: No Transportation Needs (09/30/2024)   PRAPARE - Administrator, Civil Service (Medical): No    Lack of Transportation (Non-Medical): No  Physical Activity: Not on file  Stress: Not on file  Social Connections: Socially Isolated (09/30/2024)   Social Connection and Isolation Panel    Frequency of Communication with Friends and Family: More than three times a week    Frequency of Social Gatherings with Friends and Family: More than three times a week    Attends Religious Services: Never    Database administrator or Organizations: No    Attends Banker Meetings: Never    Marital Status: Widowed  Intimate Partner Violence: Not At Risk (09/30/2024)   Humiliation, Afraid, Rape, and Kick questionnaire    Fear of Current or Ex-Partner: No    Emotionally Abused: No    Physically Abused: No    Sexually Abused: No   Family History  Problem Relation Age of Onset   Liver cancer Mother    Pancreatic cancer Mother    Colon cancer Mother     Hypertension Father    Stroke Father    Other Father 61       Sudden Cardiac death   Heart attack Father    Cancer Sister        brain   Diabetes Sister    Colon cancer Maternal Aunt    Colon polyps Neg Hx    ROS: All systems reviewed and negative except as per HPI.   Current Outpatient Medications  Medication Sig Dispense Refill   acetaminophen  (TYLENOL ) 500 MG tablet Take 1,000 mg by mouth 3 (three) times daily.     albuterol  (PROVENTIL ) (2.5 MG/3ML) 0.083% nebulizer solution INHALE ONE VIAL VIA NEBULIZER EVERY 6 HOURS AS NEEDED FOR WHEEZING OR SHORTNESS OF BREATH 90 mL 6   albuterol  (VENTOLIN  HFA) 108 (90 Base) MCG/ACT inhaler INHALE 2 PUFFS INTO  THE LUNGS EVERY 4 HOURS AS NEEDED, ONLY IF YOU CAN'T CATCH YOUR BREATH 18 g 5   atorvastatin  (LIPITOR) 80 MG tablet Take 1 tablet (80 mg total) by mouth daily. 30 tablet 11   bisoprolol  (ZEBETA ) 5 MG tablet TAKE 1 AND 1/2 TABLET BY MOUTH TWICE DAILY. 90 tablet 3   Brensocatib (BRINSUPRI) 25 MG TABS Take 25 mg by mouth daily. 30 tablet 1   BREZTRI  AEROSPHERE 160-9-4.8 MCG/ACT AERO inhaler INHALE 2 PUFFS INTO THE LUNGS TWICE DAILY. 10.7 g 5   Carboxymethylcellul-Glycerin (LUBRICATING EYE DROPS OP) Place 1 drop into the right eye 2 (two) times a week. Clear eyes as needed (Patient taking differently: Place 1 drop into the right eye as needed. Clear eyes as needed)     diltiazem  (CARDIZEM  CD) 120 MG 24 hr capsule TAKE (1) CAPSULE BY MOUTH DAILY, MAY TAKE A EXTRA CAPSULE DAILY FOR BREAKTHROUGH AFIB. 180 capsule 3   Dupilumab  (DUPIXENT ) 300 MG/2ML SOAJ Inject 300 mg into the skin every 14 (fourteen) days. 4 mL 2   Ensifentrine  (OHTUVAYRE ) 3 MG/2.5ML SUSP Take 3 mg by nebulization 2 (two) times daily. 150 mL 5   EPINEPHrine  0.3 mg/0.3 mL IJ SOAJ injection Inject 0.3 mg into the muscle as needed for anaphylaxis. 1 each 5   ezetimibe  (ZETIA ) 10 MG tablet Take 1 tablet (10 mg total) by mouth daily. 90 tablet 3   fluticasone  (FLONASE ) 50 MCG/ACT nasal  spray Place 2 sprays into both nostrils daily. 16 g 6   furosemide  (LASIX ) 40 MG tablet Take 1.5 tablets (60 mg total) by mouth 2 (two) times daily. 90 tablet 3   glipiZIDE  (GLUCOTROL  XL) 5 MG 24 hr tablet Take 5 mg by mouth daily before breakfast.     HYDROcodone -acetaminophen  (NORCO/VICODIN) 5-325 MG tablet Take 1 tablet by mouth every 6 (six) hours as needed.     JARDIANCE  25 MG TABS tablet Take 25 mg by mouth daily before breakfast.     LANTUS  100 UNIT/ML injection Inject 10 Units into the skin every morning.     levothyroxine  (SYNTHROID ) 150 MCG tablet Take 150 mcg by mouth daily before breakfast.     losartan  (COZAAR ) 50 MG tablet Take 50 mg by mouth at bedtime.     metFORMIN  (GLUCOPHAGE -XR) 500 MG 24 hr tablet Take 500 mg by mouth in the morning and at bedtime.     Multiple Vitamin (MULTIVITAMIN) tablet Take 1 tablet by mouth daily.     omeprazole  (PRILOSEC  OTC) 20 MG tablet Take 20 mg by mouth every morning.     predniSONE  (DELTASONE ) 10 MG tablet Take 4 tablets (40 mg total) by mouth daily with breakfast for 5 days, THEN 3 tablets (30 mg total) daily with breakfast for 5 days, THEN 2 tablets (20 mg total) daily with breakfast for 5 days, THEN 1 tablet (10 mg total) daily with breakfast for 7 days. 52 tablet 0   PRESCRIPTION MEDICATION 2 (two) times daily. Thumper vest (Patient taking differently: 2 (two) times daily. Thumper vest- sometimes three times daily)     Respiratory Therapy Supplies (FLUTTER) DEVI Use as directed 1 each 0   rivaroxaban  (XARELTO ) 20 MG TABS tablet Take 20 mg by mouth at bedtime.     sodium chloride  (OCEAN) 0.65 % SOLN nasal spray Place 1 spray into both nostrils as needed for congestion.     spironolactone  (ALDACTONE ) 25 MG tablet Take 0.5 tablets (12.5 mg total) by mouth daily. 45 tablet 3   No current facility-administered  medications for this visit.   Wt Readings from Last 3 Encounters:  10/04/24 211 lb 13.8 oz (96.1 kg)  09/27/24 211 lb 9.6 oz (96 kg)   09/06/24 213 lb (96.6 kg)   There were no vitals taken for this visit. Physical Exam General:  NAD. No resp difficulty, walked into clinic on oxygen  HEENT: Normal Neck: Supple. No JVD. Cor: Regular rate & rhythm. No rubs, gallops or murmurs. Lungs: Rhonchi throughout, faints wheezes in lower lobes Abdomen: Soft, nontender, nondistended.  Extremities: No cyanosis, clubbing, rash, edema Neuro: Alert & oriented x 3, moves all 4 extremities w/o difficulty. Affect pleasant.   St. Jude device interrogation:   Assessment/Plan: 1. Atrial fibrillation: Paroxysmal.  NSR today.  Had remote AF ablation, now on dofetilide .  QTC prolonged at 516 msec on most recent ECG, would like < 550 msec with RBBB so ok.  A-paced today, 2% AF burden by device check.  Feels worse when AF occurs.  - Continue dofetilide  250 mcg bid.  - Continue Xarelto  20 mg daily. No bleeding issues.   - Continue bisoprolol  7.5 mg daily and diltiazem  CD 120 mg daily for rate control if AF recurs. 2. Sick sinus syndrome: St Jude PPM.  - interrogation as above. 3. 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  Echo today showed EF 60-65%, D-shaped septum suggestive of RV pressure/volume overload, moderate RV enlargement with mildly decreased RV systolic function, mild aortic stenosis with mean gradient 10 mmHg, moderate TR, PASP 70.  NYHA class II-early III, improved. He is not volume overloaded on exam, weight down 4 lbs - Continue Lasix  60 mg bid. Recent labs reviewed and are stable, K 4.7, SCr 1.14 - Continue Jardiance .   - Continue losartan  50 mg daily. - Continue spironolactone  12.5 mg daily.  4. COPD: Chronic bronchitis/asthma overlap syndrome, quit smoking in 1985.  On 4  L home oxygen .  - Continues to follow with pulmonary.  - With productive cough and lung sounds more rhonchus today, I asked him to contact his Pulmonologist, likely needs another round of abx. 5. Chronic bronchiectasis: Has history of mucus plugging with lobar collapse.  6. OSA: Continue Bipap, no change. 7. CAD: Coronary angiography in 8/24 showed 50% dLCx, 40% pLAD.  Medical management. No chest pain. - No ASA given Xarelto  use.  - Continue atorvastatin  80 mg daily + Zetia  10 mg daily. - Goal LDL < 55. Check lipids/LFTs in 6-8 weeks (Zetia  recently added) 8. Aortic stenosis: Mild on 6/25 echo.  9. 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  I do not think that he is a candidate for pulmonary vasodilators.  - No change.  Follow up ***   [facility], Arella Blinder N PA-C 10/10/2024

## 2024-10-12 ENCOUNTER — Encounter: Payer: Self-pay | Admitting: Pulmonary Disease

## 2024-10-12 ENCOUNTER — Encounter: Payer: Self-pay | Admitting: Internal Medicine

## 2024-10-12 DIAGNOSIS — G4733 Obstructive sleep apnea (adult) (pediatric): Secondary | ICD-10-CM

## 2024-10-12 NOTE — Telephone Encounter (Signed)
 He was recently admitted to the hospital and this may be part of the issue.  The other possibility is that he may need to be titrated to AVAPS.  This would require an in-lab titration study.  I would definitely recommend this given that he has many complex comorbidities.

## 2024-10-13 ENCOUNTER — Other Ambulatory Visit: Payer: Self-pay

## 2024-10-13 ENCOUNTER — Ambulatory Visit (HOSPITAL_COMMUNITY)
Admission: RE | Admit: 2024-10-13 | Discharge: 2024-10-13 | Disposition: A | Discharge status: Home / Self Care | Source: Ambulatory Visit | Attending: Physician Assistant | Admitting: Physician Assistant

## 2024-10-13 VITALS — BP 102/68 | HR 87 | Ht 68.0 in | Wt 211.4 lb

## 2024-10-13 DIAGNOSIS — D6869 Other thrombophilia: Secondary | ICD-10-CM | POA: Diagnosis not present

## 2024-10-13 DIAGNOSIS — I4819 Other persistent atrial fibrillation: Secondary | ICD-10-CM | POA: Diagnosis not present

## 2024-10-13 DIAGNOSIS — I4891 Unspecified atrial fibrillation: Secondary | ICD-10-CM | POA: Diagnosis not present

## 2024-10-13 NOTE — Progress Notes (Signed)
 Primary Care Physician: Sheryle Carwin, MD Primary Electrophysiologist: Dr Waddell Referring Physician: Dr [facility]: Dr Rolan [name] is a 78 y.o. male with a history of SSS s/p PPM, atrial fibrillation, chronic diastolic CHF, HTN, COPD, DM, and GERD who presents for follow up in the [facility]. Patient was recently admitted with elevated temp and AMS and was diagnosed with severe sepsis 2/2 pneumonia. During his admission, he developed afib with RVR. He was given a bolus of flecainide  which converted him to atrial flutter. Patient was fairly asymptomatic. He is on Xarelto  for a CHADS2VASC score of 3.  The device clinic received an alert for an ongoing afib episode starting 04/09/21. He did have two alcohol  drinks the night before and was also being treated for an URI. COVID test was negative. He was back in SR before follow up.  Patient was noted to be out of rhythm by the device clinic on 08/23/21 and underwent DCCV on 09/05/21. He had recurrent afib and underwent another DCCV on 08/06/22. The device clinic received an alert for an ongoing afib episode starting 12/07/22.   Patient is s/p dofetilide  loading 1/16-1/19/24. He converted with the medication and did not require DCCV.   Patient returns for follow up for atrial fibrillation. He was admitted 09/30/24 to 10/04/24 with sepsis due to CAP. During his admission, his dofetilide  was held due to AKI and QT prolongation. Today, he is in afib but overall feeling much better since his hospitalization. He can tell when he is in afib because he feels off. No bleeding issues on anticoagulation.   Today, he  denies symptoms of palpitations, chest pain, orthopnea, PND, dizziness, presyncope, syncope, bleeding, or neurologic sequela. The patient is tolerating medications without difficulties and is otherwise without complaint today.    Atrial Fibrillation Risk Factors:  he does not have symptoms or diagnosis of sleep  apnea. he does not have a history of rheumatic fever. he does not have a history of alcohol  use. The patient does not have a history of early familial atrial fibrillation or other arrhythmias.   Atrial Fibrillation Management history:  Previous antiarrhythmic drugs: flecainide , dofetilide   Previous cardioversions: 11/2016, 09/05/21 Previous ablations: none Anticoagulation history: Xarelto    Past Medical History:  Diagnosis Date   Allergic rhinitis    Aortic valve disorder    Asthma    since childhood- seasonal allergies induced   Cancer (HCC)    Skin cancer- squamous, basal   Carotid artery stenosis    CHF (congestive heart failure) (HCC)    COPD (chronic obstructive pulmonary disease) (HCC)    Essential hypertension    Full dentures    GERD (gastroesophageal reflux disease)    H/O hiatal hernia    Hemorrhage of rectum    Hyperlipidemia    Hypothyroidism    Male circumcision    OSA (obstructive sleep apnea)    Osteoarthritis    Pacemaker    Oct 2005 in [place].   PAF (paroxysmal atrial fibrillation) (HCC)    Pneumonia    several Times 2015 last time   Primary localized osteoarthritis of right knee 08/11/2017   RBBB (right bundle branch block)    Sinoatrial node dysfunction (HCC)    Syncope    Tricuspid valve disorder    Type 2 diabetes mellitus (HCC)    Type II    Current Outpatient Medications  Medication Sig Dispense Refill   acetaminophen  (TYLENOL ) 500 MG tablet Take 1,000 mg  by mouth 3 (three) times daily. (Patient taking differently: Take 2,500 mg by mouth every morning.)     albuterol  (PROVENTIL ) (2.5 MG/3ML) 0.083% nebulizer solution INHALE ONE VIAL VIA NEBULIZER EVERY 6 HOURS AS NEEDED FOR WHEEZING OR SHORTNESS OF BREATH 90 mL 6   albuterol  (VENTOLIN  HFA) 108 (90 Base) MCG/ACT inhaler INHALE 2 PUFFS INTO THE LUNGS EVERY 4 HOURS AS NEEDED, ONLY IF YOU CAN'T CATCH YOUR BREATH 18 g 5   atorvastatin  (LIPITOR) 80 MG tablet Take 1 tablet (80 mg total) by mouth  daily. 30 tablet 11   bisoprolol  (ZEBETA ) 5 MG tablet TAKE 1 AND 1/2 TABLET BY MOUTH TWICE DAILY. 90 tablet 3   Brensocatib (BRINSUPRI) 25 MG TABS Take 25 mg by mouth daily. 30 tablet 1   BREZTRI  AEROSPHERE 160-9-4.8 MCG/ACT AERO inhaler INHALE 2 PUFFS INTO THE LUNGS TWICE DAILY. 10.7 g 5   Carboxymethylcellul-Glycerin (LUBRICATING EYE DROPS OP) Place 1 drop into the right eye 2 (two) times a week. Clear eyes as needed     diltiazem  (CARDIZEM  CD) 120 MG 24 hr capsule TAKE (1) CAPSULE BY MOUTH DAILY, MAY TAKE A EXTRA CAPSULE DAILY FOR BREAKTHROUGH AFIB. 180 capsule 3   Dupilumab  (DUPIXENT ) 300 MG/2ML SOAJ Inject 300 mg into the skin every 14 (fourteen) days. 4 mL 2   Ensifentrine  (OHTUVAYRE ) 3 MG/2.5ML SUSP Take 3 mg by nebulization 2 (two) times daily. 150 mL 5   EPINEPHrine  0.3 mg/0.3 mL IJ SOAJ injection Inject 0.3 mg into the muscle as needed for anaphylaxis. 1 each 5   ezetimibe  (ZETIA ) 10 MG tablet Take 1 tablet (10 mg total) by mouth daily. 90 tablet 3   fluticasone  (FLONASE ) 50 MCG/ACT nasal spray Place 2 sprays into both nostrils daily. 16 g 6   furosemide  (LASIX ) 40 MG tablet Take 1.5 tablets (60 mg total) by mouth 2 (two) times daily. 90 tablet 3   glipiZIDE  (GLUCOTROL  XL) 5 MG 24 hr tablet Take 5 mg by mouth daily before breakfast.     HYDROcodone -acetaminophen  (NORCO/VICODIN) 5-325 MG tablet Take 1 tablet by mouth every 6 (six) hours as needed.     JARDIANCE  25 MG TABS tablet Take 25 mg by mouth daily before breakfast.     LANTUS  100 UNIT/ML injection Inject 10 Units into the skin every morning.     levothyroxine  (SYNTHROID ) 150 MCG tablet Take 150 mcg by mouth daily before breakfast.     losartan  (COZAAR ) 50 MG tablet Take 50 mg by mouth at bedtime.     metFORMIN  (GLUCOPHAGE -XR) 500 MG 24 hr tablet Take 500 mg by mouth in the morning and at bedtime.     Multiple Vitamin (MULTIVITAMIN) tablet Take 1 tablet by mouth daily.     Nebulizers (PARI LC PLUS NEBULIZER) MISC Use with nebulized  medications as directed. Replace tubing every 6 months. 1 each 0   omeprazole  (PRILOSEC  OTC) 20 MG tablet Take 20 mg by mouth every morning.     predniSONE  (DELTASONE ) 10 MG tablet Take 4 tablets (40 mg total) by mouth daily with breakfast for 5 days, THEN 3 tablets (30 mg total) daily with breakfast for 5 days, THEN 2 tablets (20 mg total) daily with breakfast for 5 days, THEN 1 tablet (10 mg total) daily with breakfast for 7 days. 52 tablet 0   PRESCRIPTION MEDICATION 2 (two) times daily. Thumper vest     Respiratory Therapy Supplies (FLUTTER) DEVI Use as directed 1 each 0   rivaroxaban  (XARELTO ) 20 MG TABS tablet Take  20 mg by mouth at bedtime.     sodium chloride  (OCEAN) 0.65 % SOLN nasal spray Place 1 spray into both nostrils as needed for congestion.     spironolactone  (ALDACTONE ) 25 MG tablet Take 0.5 tablets (12.5 mg total) by mouth daily. 45 tablet 3   No current facility-administered medications for this encounter.    ROS- All systems are reviewed and negative except as per the HPI above.  Physical Exam: Vitals:   10/13/24 1530  BP: 102/68  Pulse: 87  Weight: 95.9 kg  Height: 5' 8 (1.727 m)     GEN: Well nourished, well developed in no acute distress CARDIAC: Irregularly irregular rate and rhythm, no murmurs, rubs, gallops RESPIRATORY:  bilateral wheezing ABDOMEN: Soft, non-tender, non-distended EXTREMITIES:  1+ bilateral edema, No deformity    Wt Readings from Last 3 Encounters:  10/13/24 95.9 kg  10/04/24 96.1 kg  09/27/24 96 kg    EKG today demonstrates Afib, RBBB Vent. rate 87 BPM PR interval * ms QRS duration 144 ms QT/QTcB 384/462 ms   Echo 06/10/24 demonstrated   1. Left ventricular ejection fraction, by estimation, is 60 to 65%. The  left ventricle has normal function. The left ventricle has no regional  wall motion abnormalities. Left ventricular diastolic parameters were  normal.   2. Right ventricular systolic function is normal. The right  ventricular  size is severely enlarged.   3. Right atrial size was severely dilated.   4. There is no evidence of cardiac tamponade.   5. The mitral valve is degenerative. Mild mitral valve regurgitation. No  evidence of mitral stenosis.   6. Tricuspid valve regurgitation is moderate.   7. The aortic valve is calcified. Unable to determine aortic valve  morphology due to image quality. Aortic valve regurgitation is not  visualized. Calcified leaflets, reduced leaflet excursion, mild to  moderate aortic stenosis based on DI (0.33) and AVA  (VTI) 1.15cm2. Peak velocity 2.81m/s and MG 10 mmHG.   8. The inferior vena cava is dilated in size with <50% respiratory  variability, suggesting right atrial pressure of 15 mmHg.   9. Ascending aorta measurements are within normal limits for age when  indexed to body surface area.   Comparison(s): A prior study was performed on 04/07/2024. LVEF remains  stable, RV function has improved, but RV size remains dilated.    Epic records are reviewed at length today   CHA2DS2-VASc Score = 6  The patient's score is based upon: CHF History: 1 HTN History: 1 Diabetes History: 1 Stroke History: 0 Vascular Disease History: 1 Age Score: 2 Gender Score: 0       ASSESSMENT AND PLAN: Persistent Atrial Fibrillation (ICD10:  I48.19) The patient's CHA2DS2-VASc score is 6, indicating a 9.7% annual risk of stroke.   Dofetilide  was held during recent admission due to AKI and QT prolongation in setting of sepsis/CAP.  We discussed rhythm control options today. He is not a good candidate for class IC due to h/o CAD. Would avoid amiodarone with baseline lung issues. Also not likely an ablation candidate with chronic O2 use.  D/w Dr Waddell, will schedule admission to reload dofetilide . He will call clinci back with availability.  Continue bisoprolol  7.5 mg daily Continue diltiazem  120 mg daily Continue Xarelto  20 mg daily  Secondary Hypercoagulable State  (ICD10:  D68.69) The patient is at significant risk for stroke/thromboembolism based upon his CHA2DS2-VASc Score of 6.  Continue Rivaroxaban  (Xarelto ). No bleeding issues.   SSS S/p PPM, followed  by Dr Waddell  HTN Stable on current regimen  CAD No anginal symptoms Followed by Dr Rolan  Chronic HFpEF EF 60-65% GDMT per [facility] team Fluid status appears stable today   Follow up in the AF clinic for dofetilide  loading.    Clarence Kicks PA-C [facility] [address] [place], [place] [address] [phone] 10/13/2024 3:58 PM

## 2024-10-13 NOTE — Telephone Encounter (Signed)
Order for titration study done.

## 2024-10-13 NOTE — Telephone Encounter (Signed)
 We will have to set him up at the sleep lab either at [facility] or here at [facility] as [facility] no longer has a sleep lab.  If she could let us  know what he prefers I will send the order.

## 2024-10-14 ENCOUNTER — Other Ambulatory Visit (HOSPITAL_COMMUNITY): Payer: Self-pay

## 2024-10-14 ENCOUNTER — Other Ambulatory Visit: Payer: Self-pay

## 2024-10-18 ENCOUNTER — Ambulatory Visit (HOSPITAL_COMMUNITY)
Admission: RE | Admit: 2024-10-18 | Discharge: 2024-10-18 | Disposition: A | Discharge status: Home / Self Care | Source: Ambulatory Visit | Attending: Pulmonary Disease | Admitting: Pulmonary Disease

## 2024-10-18 ENCOUNTER — Encounter: Payer: Self-pay | Admitting: Internal Medicine

## 2024-10-18 DIAGNOSIS — J479 Bronchiectasis, uncomplicated: Secondary | ICD-10-CM | POA: Insufficient documentation

## 2024-10-18 DIAGNOSIS — J4489 Other specified chronic obstructive pulmonary disease: Secondary | ICD-10-CM | POA: Diagnosis present

## 2024-10-18 DIAGNOSIS — R042 Hemoptysis: Secondary | ICD-10-CM | POA: Diagnosis present

## 2024-10-19 ENCOUNTER — Ambulatory Visit
Admission: EM | Admit: 2024-10-19 | Discharge: 2024-10-19 | Disposition: A | Discharge status: Home / Self Care | Attending: Family Medicine | Admitting: Family Medicine

## 2024-10-19 ENCOUNTER — Encounter: Payer: Self-pay | Admitting: Emergency Medicine

## 2024-10-19 ENCOUNTER — Telehealth: Payer: Self-pay | Admitting: Pharmacist

## 2024-10-19 DIAGNOSIS — T148XXA Other injury of unspecified body region, initial encounter: Secondary | ICD-10-CM

## 2024-10-19 DIAGNOSIS — Z23 Encounter for immunization: Secondary | ICD-10-CM | POA: Diagnosis not present

## 2024-10-19 DIAGNOSIS — M79604 Pain in right leg: Secondary | ICD-10-CM

## 2024-10-19 MED ORDER — CHLORHEXIDINE GLUCONATE 4 % EX SOLN: Freq: Every day | CUTANEOUS | 0 refills | Status: DC | PRN

## 2024-10-19 MED ORDER — BACITRACIN 500 UNIT/GM EX OINT
1.0000 | TOPICAL_OINTMENT | Freq: Two times a day (BID) | CUTANEOUS | Status: DC
  Administered 2024-10-19: 1 via TOPICAL

## 2024-10-19 MED ORDER — CEPHALEXIN 500 MG PO CAPS: 500.0000 mg | ORAL_CAPSULE | Freq: Two times a day (BID) | ORAL | 0 refills | Status: DC

## 2024-10-19 MED ORDER — MUPIROCIN 2 % EX OINT
1.0000 | TOPICAL_OINTMENT | Freq: Every day | CUTANEOUS | 0 refills | Status: DC
Start: 2024-10-19 — End: 2024-11-08

## 2024-10-19 MED ORDER — TETANUS-DIPHTH-ACELL PERTUSSIS 5-2-15.5 LF-MCG/0.5 IM SUSP
0.5000 mL | Freq: Once | INTRAMUSCULAR | Status: AC
  Administered 2024-10-19: 0.5 mL via INTRAMUSCULAR

## 2024-10-19 NOTE — ED Provider Notes (Signed)
 [facility]    CSN: [id number] Arrival date & time: 10/19/24  9085      History   Chief Complaint No chief complaint on file.   HPI Clarence Dawson is a 78 y.o. male.   Patient presenting today with a large skin tear to the right lower leg from hitting his leg on his oxygen  tank yesterday.  He states he has had significant bleeding from the area despite pressure dressings.  Denies numbness, tingling, decreased range of motion, weakness, fever, chills.  Of note, patient is on Xarelto  daily.  Unsure when his last tetanus shot was, he thinks about 10 years ago.    Past Medical History:  Diagnosis Date   Allergic rhinitis    Aortic valve disorder    Asthma    since childhood- seasonal allergies induced   Cancer (HCC)    Skin cancer- squamous, basal   Carotid artery stenosis    CHF (congestive heart failure) (HCC)    COPD (chronic obstructive pulmonary disease) (HCC)    Essential hypertension    Full dentures    GERD (gastroesophageal reflux disease)    H/O hiatal hernia    Hemorrhage of rectum    Hyperlipidemia    Hypothyroidism    Male circumcision    OSA (obstructive sleep apnea)    Osteoarthritis    Pacemaker    Oct 2005 in [place].   PAF (paroxysmal atrial fibrillation) (HCC)    Pneumonia    several Times 2015 last time   Primary localized osteoarthritis of right knee 08/11/2017   RBBB (right bundle branch block)    Sinoatrial node dysfunction (HCC)    Syncope    Tricuspid valve disorder    Type 2 diabetes mellitus (HCC)    Type II    Patient Active Problem List   Diagnosis Date Noted   Sepsis due to undetermined organism (HCC) 10/03/2024   Hyperkalemia 10/01/2024   Type 2 diabetes mellitus with hyperglycemia (HCC) 10/01/2024   AKI (acute kidney injury) 10/01/2024   Obesity, Class I, BMI 30-34.9 10/01/2024   Lactic acidosis 04/07/2024   Elevated brain natriuretic peptide (BNP) level 04/07/2024   COPD (chronic obstructive pulmonary  disease) (HCC) 04/07/2024   Elevated troponin 04/07/2024   Prolonged QT interval 04/07/2024   Atrial fibrillation, chronic (HCC) 04/07/2024   Acquired hypothyroidism 04/07/2024   Encounter for monitoring dofetilide  therapy 02/25/2024   Chronic respiratory failure with hypoxia (HCC) 06/12/2023   Hypercoagulable state due to persistent atrial fibrillation (HCC) 01/02/2020   Acute on chronic diastolic CHF (congestive heart failure) (HCC) 11/22/2019   Paroxysmal atrial fibrillation (HCC) 09/22/2019   Atrial fibrillation with RVR (HCC)    Lobar pneumonia 09/21/2019   Acute respiratory failure with hypoxia (HCC) 09/21/2019   Acute on chronic respiratory failure with hypoxia (HCC) 09/18/2019   Elevated lactic acid level    GERD (gastroesophageal reflux disease)    Chronic bronchitis (HCC) 02/09/2019   Sepsis due to pneumonia (HCC) 03/03/2018   Primary localized osteoarthritis of right knee 08/11/2017   Acute exacerbation of COPD with asthma (HCC) 12/08/2016   Chest pain 10/20/2016   Essential hypertension 10/20/2016   Morbid obesity due to excess calories (HCC) 06/24/2016   Severe persistent asthma (HCC) 02/12/2016   Upper airway cough syndrome 01/24/2016   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   Persistent atrial fibrillation    Syncope    Hyperthyroidism    Mixed hyperlipidemia    RBBB (right bundle branch block)    Tricuspid valve  disorder    Aortic valve disorder    Carotid artery stenosis    Type 2 diabetes mellitus (HCC)    Pacemaker    OSA on CPAP    Arthritis    Sinoatrial node dysfunction (HCC)    PVC's (premature ventricular contractions) 07/17/2011   Essential hypertension, benign 01/24/2011   Premature ventricular contractions 01/24/2011   PPM-St.[place]ude 01/24/2011    Past Surgical History:  Procedure Laterality Date   A-V CARDIAC PACEMAKER INSERTION     Sick sinus syndrome DDR pacer   Arthropathy Right 2005   Rebuilding of left thumb and joint    CARDIAC CATHETERIZATION      CARDIAC ELECTROPHYSIOLOGY STUDY AND ABLATION  09/2008   for pvcs, Dr. Andreas   CARDIOVERSION N/A 12/18/2016   Procedure: CARDIOVERSION;  Surgeon: Vinie JAYSON Maxcy, MD;  Location: [facility] ENDOSCOPY;  Service: Cardiovascular;  Laterality: N/A;   CARDIOVERSION N/A 09/05/2021   Procedure: CARDIOVERSION;  Surgeon: Barbaraann Darryle Ned, MD;  Location: [facility] ENDOSCOPY;  Service: Cardiovascular;  Laterality: N/A;   CARDIOVERSION N/A 08/06/2022   Procedure: CARDIOVERSION;  Surgeon: Kate Lonni CROME, MD;  Location: [facility] ENDOSCOPY;  Service: Cardiovascular;  Laterality: N/A;   CARPAL TUNNEL RELEASE  1994   right wrist   CARPAL TUNNEL RELEASE  05/04/2012   Procedure: CARPAL TUNNEL RELEASE;  Surgeon: Arley JONELLE Curia, MD;  Location: [facility];  Service: Orthopedics;  Laterality: Left;   CARPOMETACARPEL SUSPENSION PLASTY Right 11/16/2014   Procedure: SUSPENSIONPLASTY RIGHT THUMB TENDON TRANSFER ABDUCTOR POLLICUS LONGUS EXCISION TRAPEZIUM;  Surgeon: Arley Curia, MD;  Location: [facility];  Service: Orthopedics;  Laterality: Right;   CHOLECYSTECTOMY  1994   CIRCUMCISION     COLONOSCOPY N/A 03/14/2013   Procedure: COLONOSCOPY;  Surgeon: Lamar CHRISTELLA Hollingshead, MD;  [facility];  Service: Endoscopy;  Laterality: N/A;  8:15 AM   EYE SURGERY     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-[facility]   EYE SURGERY  2012   Left eye Corneal transplant- partial- Cataract   FLEXIBLE BRONCHOSCOPY Right 06/23/2019   Procedure: FLEXIBLE BRONCHOSCOPY RIGHT;  Surgeon: Verdia Art, MD;  Location: [facility] ORS;  Service: Pulmonary;  Laterality: Right;   GALLBLADDER SURGERY  12/01/2006   HAND TENDON SURGERY Left late 1990's   thumb   HEMORROIDECTOMY  2003   INJECTION KNEE Left 08/26/2017   Procedure: LEFT KNEE INJECTION;  Surgeon: Jane Lamar, MD;  Location: [facility] OR;  Service: Orthopedics;  Laterality: Left;   left Knee Arthroscopy     [facility]   PARTIAL KNEE ARTHROPLASTY   11/22/2012   Procedure: UNICOMPARTMENTAL KNEE;  Surgeon: Lamar DELENA Jane, MD;  Location: [facility] OR;  Service: Orthopedics;  Laterality: Left;  left unicompartmental knee arthroplasty   PERMANENT PACEMAKER GENERATOR CHANGE N/A 01/12/2013   Procedure: PERMANENT PACEMAKER GENERATOR CHANGE;  Surgeon: Danelle LELON Birmingham, MD;  Location: [facility] CATH LAB;  Service: Cardiovascular;  Laterality: N/A;   PPM GENERATOR CHANGEOUT N/A 07/07/2022   Procedure: PPM GENERATOR CHANGEOUT;  Surgeon: Birmingham Danelle LELON, MD;  Location: [facility] INVASIVE CV LAB;  Service: Cardiovascular;  Laterality: N/A;   RIGHT/LEFT HEART CATH AND CORONARY ANGIOGRAPHY N/A 08/28/2023   Procedure: RIGHT/LEFT HEART CATH AND CORONARY ANGIOGRAPHY;  Surgeon: Rolan Ezra RAMAN, MD;  Location: [facility] INVASIVE CV LAB;  Service: Cardiovascular;  Laterality: N/A;   Rotator cuff Surgery  2001   Right shoulder   TONSILLECTOMY     TOTAL KNEE ARTHROPLASTY Right 08/26/2017   Procedure: TOTAL KNEE ARTHROPLASTY;  Surgeon: Jane Charleston, MD;  Location: [facility] OR;  Service: Orthopedics;  Laterality: Right;   varicose vein reduction         Home Medications    Prior to Admission medications   Medication Sig Start Date End Date Taking? Authorizing Provider  cephALEXin  (KEFLEX ) 500 MG capsule Take 1 capsule (500 mg total) by mouth 2 (two) times daily. 10/19/24  Yes Stuart Vernell Norris, PA-C  chlorhexidine  (HIBICLENS ) 4 % external liquid Apply topically daily as needed. 10/19/24  Yes Stuart Vernell Norris, PA-C  mupirocin  ointment (BACTROBAN ) 2 % Apply 1 Application topically daily. 10/19/24  Yes Stuart Vernell Norris, PA-C  acetaminophen  (TYLENOL ) 500 MG tablet Take 1,000 mg by mouth 3 (three) times daily. Patient taking differently: Take 2,500 mg by mouth every morning.    [provider]  albuterol  (PROVENTIL ) (2.5 MG/3ML) 0.083% nebulizer solution INHALE ONE VIAL VIA NEBULIZER EVERY 6 HOURS AS NEEDED FOR WHEEZING OR SHORTNESS OF BREATH 09/01/24   Tamea Dedra CROME,  MD  albuterol  (VENTOLIN  HFA) 108 (90 Base) MCG/ACT inhaler INHALE 2 PUFFS INTO THE LUNGS EVERY 4 HOURS AS NEEDED, ONLY IF YOU CAN'T CATCH YOUR BREATH 06/02/24   Tamea Dedra CROME, MD  atorvastatin  (LIPITOR) 80 MG tablet Take 1 tablet (80 mg total) by mouth daily. 04/15/24   Rolan Ezra RAMAN, MD  bisoprolol  (ZEBETA ) 5 MG tablet TAKE 1 AND 1/2 TABLET BY MOUTH TWICE DAILY. 04/17/17   Dow Arland BROCKS, NP  Brensocatib (BRINSUPRI) 25 MG TABS Take 25 mg by mouth daily. 10/04/24   Tamea Dedra CROME, MD  BREZTRI  AEROSPHERE 160-9-4.8 MCG/ACT AERO inhaler INHALE 2 PUFFS INTO THE LUNGS TWICE DAILY. 09/26/24   Tamea Dedra CROME, MD  Carboxymethylcellul-Glycerin (LUBRICATING EYE DROPS OP) Place 1 drop into the right eye 2 (two) times a week. Clear eyes as needed    [provider]  diltiazem  (CARDIZEM  CD) 120 MG 24 hr capsule TAKE (1) CAPSULE BY MOUTH DAILY, MAY TAKE A EXTRA CAPSULE DAILY FOR BREAKTHROUGH AFIB. 12/09/23   Waddell Danelle ORN, MD  Dupilumab  (DUPIXENT ) 300 MG/2ML SOAJ Inject 300 mg into the skin every 14 (fourteen) days. 10/05/24   Tamea Dedra CROME, MD  Ensifentrine  (OHTUVAYRE ) 3 MG/2.5ML SUSP Take 3 mg by nebulization 2 (two) times daily. 06/13/24   Tamea Dedra CROME, MD  EPINEPHrine  0.3 mg/0.3 mL IJ SOAJ injection Inject 0.3 mg into the muscle as needed for anaphylaxis. 11/05/20   Tamea Dedra CROME, MD  ezetimibe  (ZETIA ) 10 MG tablet Take 1 tablet (10 mg total) by mouth daily. 06/20/24 10/13/24  Rolan Ezra RAMAN, MD  fluticasone  (FLONASE ) 50 MCG/ACT nasal spray Place 2 sprays into both nostrils daily. 10/21/23   Tobie Eldora NOVAK, MD  furosemide  (LASIX ) 40 MG tablet Take 1.5 tablets (60 mg total) by mouth 2 (two) times daily. 06/10/24   Rolan Ezra RAMAN, MD  glipiZIDE  (GLUCOTROL  XL) 5 MG 24 hr tablet Take 5 mg by mouth daily before breakfast.    [provider]  HYDROcodone -acetaminophen  (NORCO/VICODIN) 5-325 MG tablet Take 1 tablet by mouth every 6 (six) hours as needed. 09/26/24    [provider]  JARDIANCE  25 MG TABS tablet Take 25 mg by mouth daily before breakfast. 04/11/21   [provider]  LANTUS  100 UNIT/ML injection Inject 10 Units into the skin every morning. 09/20/24   [provider]  levothyroxine  (SYNTHROID ) 150 MCG tablet Take 150 mcg by mouth daily before breakfast. 06/01/23   [provider]  losartan  (COZAAR ) 50 MG tablet  Take 50 mg by mouth at bedtime. 04/27/20   [provider]  metFORMIN  (GLUCOPHAGE -XR) 500 MG 24 hr tablet Take 500 mg by mouth in the morning and at bedtime.    [provider]  Multiple Vitamin (MULTIVITAMIN) tablet Take 1 tablet by mouth daily.    [provider]  Nebulizers (PARI LC PLUS NEBULIZER) MISC Use with nebulized medications as directed. Replace tubing every 6 months. 10/10/24   Tamea Dedra CROME, MD  omeprazole  (PRILOSEC  OTC) 20 MG tablet Take 20 mg by mouth every morning.    [provider]  predniSONE  (DELTASONE ) 10 MG tablet Take 4 tablets (40 mg total) by mouth daily with breakfast for 5 days, THEN 3 tablets (30 mg total) daily with breakfast for 5 days, THEN 2 tablets (20 mg total) daily with breakfast for 5 days, THEN 1 tablet (10 mg total) daily with breakfast for 7 days. 10/04/24 10/26/24  Caleen Burgess BROCKS, MD  PRESCRIPTION MEDICATION 2 (two) times daily. Thumper vest    [provider]  Respiratory Therapy Supplies (FLUTTER) DEVI Use as directed 10/10/19   Tamea Dedra CROME, MD  rivaroxaban  (XARELTO ) 20 MG TABS tablet Take 20 mg by mouth at bedtime.    [provider]  sodium chloride  (OCEAN) 0.65 % SOLN nasal spray Place 1 spray into both nostrils as needed for congestion.    [provider]  spironolactone  (ALDACTONE ) 25 MG tablet Take 0.5 tablets (12.5 mg total) by mouth daily. 06/10/24   Rolan Ezra RAMAN, MD    Family History Family History  Problem Relation Age of Onset   Liver cancer Mother    Pancreatic cancer Mother     Colon cancer Mother    Hypertension Father    Stroke Father    Other Father 79       Sudden Cardiac death   Heart attack Father    Cancer Sister        brain   Diabetes Sister    Colon cancer Maternal Aunt    Colon polyps Neg Hx     Social History Social History   Tobacco Use   Smoking status: Former    Current packs/day: 0.00    Average packs/day: 3.0 packs/day for 26.0 years (77.9 ttl pk-yrs)    Types: Cigarettes    Start date: 02/20/1958    Quit date: 02/12/1984    Years since quitting: 40.7   Smokeless tobacco: Former    Types: Chew   Tobacco comments:    Former smoker 08/30/2021  Vaping Use   Vaping status: Never Used  Substance Use Topics   Alcohol  use: Yes    Alcohol /week: 2.0 - 4.0 standard drinks of alcohol     Types: 1 - 2 Glasses of wine, 1 - 2 Cans of beer per week    Comment: 1 glass of wine or beer 3 times a week 12/26/22   Drug use: No     Allergies   Food, Iodinated contrast media, Shellfish allergy , Goat-derived products, Prednisone , Diclofenac sodium, Metformin  and related, Vancomycin , and Voltaren [diclofenac sodium]   Review of Systems Review of Systems Per HPI  Physical Exam Triage Vital Signs ED Triage Vitals  Encounter Vitals Group     BP 10/19/24 0940 91/62     Girls Systolic BP Percentile --      Girls Diastolic BP Percentile --      Boys Systolic BP Percentile --      Boys Diastolic BP Percentile --  Pulse Rate 10/19/24 0940 (!) 102     Resp 10/19/24 0940 18     Temp 10/19/24 0940 98.3 F (36.8 C)     Temp Source 10/19/24 0940 Oral     SpO2 10/19/24 0940 91 %     Weight --      Height --      Head Circumference --      Peak Flow --      Pain Score 10/19/24 0941 0     Pain Loc --      Pain Education --      Exclude from Growth Chart --    No data found.  Updated Vital Signs BP 91/62 (BP Location: Right Arm)   Pulse (!) 102   Temp 98.3 F (36.8 C) (Oral)   Resp 18   SpO2 91%   Visual Acuity Right Eye  Distance:   Left Eye Distance:   Bilateral Distance:    Right Eye Near:   Left Eye Near:    Bilateral Near:     Physical Exam Vitals and nursing note reviewed.  Constitutional:      Appearance: Normal appearance.  HENT:     Head: Atraumatic.  Eyes:     Extraocular Movements: Extraocular movements intact.     Conjunctiva/sclera: Conjunctivae normal.  Cardiovascular:     Rate and Rhythm: Normal rate.  Pulmonary:     Effort: Pulmonary effort is normal.  Musculoskeletal:        General: Swelling present. Normal range of motion.     Cervical back: Normal range of motion and neck supple.     Comments: Trace chronic edema to the right lower extremity, per patient at baseline  Skin:    General: Skin is warm.     Comments: 2 to 3 cm diameter superficial skin avulsion to the right lower leg with active bleeding when dressing taken off.  No foreign body appreciable on wound bed, no erythema, induration, fluctuance, drainage  Neurological:     Mental Status: He is oriented to person, place, and time.     Comments: Right lower extremity neurovascularly intact  Psychiatric:        Mood and Affect: Mood normal.        Thought Content: Thought content normal.        Judgment: Judgment normal.      UC Treatments / Results  Labs (all labs ordered are listed, but only abnormal results are displayed) Labs Reviewed - No data to display  EKG   Radiology No results found.  Procedures Procedures (including critical care time)  Medications Ordered in UC Medications  bacitracin ointment 1 Application (1 Application Topical Given 10/19/24 1120)  Tdap (ADACEL) injection 0.5 mL (0.5 mLs Intramuscular Given 10/19/24 1121)    Initial Impression / Assessment and Plan / UC Course  I have reviewed the triage vital signs and the nursing notes.  Pertinent labs & imaging results that were available during my care of the patient were reviewed by me and considered in my medical decision making  (see chart for details).     Let gel applied with good results regarding active bleeding likely secondary to Xarelto .  Wound cleaned and dressed with bacitracin and nonstick dressing.  Will treat with Keflex , Hibiclens , mupirocin , good home wound care with nonstick dressings, leg elevation.  Tdap updated.  Return for worsening symptoms.  Final Clinical Impressions(s) / UC Diagnoses   Final diagnoses:  Skin avulsion  Right leg pain  Need  for Tdap vaccination     Discharge Instructions      Clean the area at least once a day with the Hibiclens  solution, apply the mupirocin  ointment and a nonstick gauze pad.  Secure in place with Coban wrap.  Elevate your leg at rest to help with swelling.  Take the course of antibiotics and follow-up for worsening or unresolving symptoms.  We have updated your tetanus shot today as well.    ED Prescriptions     Medication Sig Dispense Auth. Provider   cephALEXin  (KEFLEX ) 500 MG capsule Take 1 capsule (500 mg total) by mouth 2 (two) times daily. 14 capsule Stuart Vernell Norris, PA-C   mupirocin  ointment (BACTROBAN ) 2 % Apply 1 Application topically daily. 60 g Stuart Vernell Norris, PA-C   chlorhexidine  (HIBICLENS ) 4 % external liquid Apply topically daily as needed. 236 mL Stuart Vernell Norris, [place]      PDMP not reviewed this encounter.   Stuart Vernell Norris, [place] 10/19/24 1132

## 2024-10-19 NOTE — ED Triage Notes (Signed)
 Hit right lower leg with O2 tank stand yesterday.  Has skin tear that is slowly tickling blood.

## 2024-10-19 NOTE — Telephone Encounter (Signed)
 Medication list reviewed in anticipation of upcoming Tikosyn  initiation. Patient is not taking any contraindicated medication but is taking several QTc prolonging medications.   Furosemide : Loop Diuretics may increase QTc-prolonging effects of Dofetilide  by altering K and Mag. Please watch K and Mag more closely.   Albuterol : May increase QTc (indeterminate risk). Monitor  Breztri : May increase QTc (indeterminate risk). Monitor  Metformin : may increase serum concentrations of dofetilide . Monitor for adverse effects include prolonged QT  Diltiazem : may increase serum concentrations of dofetilide . Monitor for adverse effects include prolonged QT  Spironolactone : Spironolactone  may increase serum concentrations of dofetilide . Monitor for adverse effects include prolonged QT  Patient is anticoagulated on Xarelto  on the appropriate dose. Please ensure that patient has not missed any anticoagulation doses in the 3 weeks prior to Tikosyn  initiation.   Patient will need to be counseled to avoid use of Benadryl  while on Tikosyn  and in the 2-3 days prior to Tikosyn  initiation.

## 2024-10-19 NOTE — Discharge Instructions (Signed)
 Clean the area at least once a day with the Hibiclens  solution, apply the mupirocin  ointment and a nonstick gauze pad.  Secure in place with Coban wrap.  Elevate your leg at rest to help with swelling.  Take the course of antibiotics and follow-up for worsening or unresolving symptoms.  We have updated your tetanus shot today as well.

## 2024-10-19 NOTE — Telephone Encounter (Signed)
 Copied from CRM [id number]. Topic: Clinical - Prescription Issue >> Oct 19, 2024 11:30 AM Celestine FALCON wrote: Reason for CRM: Daivd with [facility] - [address] - [place], [place] - [address] stated he was having difficulty reaching the pt for his prescription Bensupri 25 mg. He stated if he cannot get a hold of the pt, then he would send out a letter to the address. Alm stated the pt has a zero dollar copay for his medication.

## 2024-10-20 ENCOUNTER — Ambulatory Visit (HOSPITAL_COMMUNITY): Admitting: Physician Assistant

## 2024-10-21 ENCOUNTER — Ambulatory Visit: Payer: Self-pay | Admitting: Pulmonary Disease

## 2024-10-24 ENCOUNTER — Other Ambulatory Visit (HOSPITAL_COMMUNITY): Payer: Self-pay

## 2024-10-24 ENCOUNTER — Telehealth (HOSPITAL_COMMUNITY): Payer: Self-pay | Admitting: *Deleted

## 2024-10-24 ENCOUNTER — Telehealth (HOSPITAL_COMMUNITY): Payer: Self-pay | Admitting: Pharmacy Technician

## 2024-10-24 NOTE — Telephone Encounter (Signed)
 Per [facility] - no preauthorization needed for tikosyn  admission on 10/31/24. Once admitted authorization team should notify [facility] of admission. Reference # [id number]

## 2024-10-24 NOTE — Telephone Encounter (Signed)
 Good to know he got the Brinsupri.

## 2024-10-24 NOTE — Telephone Encounter (Signed)
 Patient Product/process Development Scientist completed.    The patient is insured through [facility]. Patient has Medicare and is not eligible for a copay card, but may be able to apply for patient assistance or Medicare RX Payment Plan (Patient Must reach out to their plan, if eligible for payment plan), if available.    Ran test claim for dofetilide  (Tikosyn ) 500 mcg and the current 30 day co-pay is $0.00.   This test claim was processed through [facility]- copay amounts may vary at other pharmacies due to pharmacy/plan contracts, or as the patient moves through the different stages of their insurance plan.     Reyes Sharps, CPHT Pharmacy Technician Patient Advocate Specialist Lead [facility] Patient Advocate Team Direct Number: [phone]  Fax: [phone]

## 2024-10-30 ENCOUNTER — Inpatient Hospital Stay (HOSPITAL_COMMUNITY)
Admission: EM | Admit: 2024-10-30 | Discharge: 2024-11-03 | DRG: 189 | Disposition: A | Discharge status: Home / Self Care | Attending: Internal Medicine | Admitting: Internal Medicine

## 2024-10-30 ENCOUNTER — Encounter (HOSPITAL_COMMUNITY): Payer: Self-pay

## 2024-10-30 ENCOUNTER — Other Ambulatory Visit: Payer: Self-pay

## 2024-10-30 ENCOUNTER — Emergency Department (HOSPITAL_COMMUNITY)

## 2024-10-30 DIAGNOSIS — Z85828 Personal history of other malignant neoplasm of skin: Secondary | ICD-10-CM

## 2024-10-30 DIAGNOSIS — J4489 Other specified chronic obstructive pulmonary disease: Secondary | ICD-10-CM

## 2024-10-30 DIAGNOSIS — I5033 Acute on chronic diastolic (congestive) heart failure: Secondary | ICD-10-CM | POA: Diagnosis not present

## 2024-10-30 DIAGNOSIS — E1165 Type 2 diabetes mellitus with hyperglycemia: Secondary | ICD-10-CM | POA: Diagnosis not present

## 2024-10-30 DIAGNOSIS — Z794 Long term (current) use of insulin: Secondary | ICD-10-CM

## 2024-10-30 DIAGNOSIS — Z947 Corneal transplant status: Secondary | ICD-10-CM | POA: Diagnosis not present

## 2024-10-30 DIAGNOSIS — E1121 Type 2 diabetes mellitus with diabetic nephropathy: Secondary | ICD-10-CM | POA: Diagnosis not present

## 2024-10-30 DIAGNOSIS — Z79899 Other long term (current) drug therapy: Secondary | ICD-10-CM

## 2024-10-30 DIAGNOSIS — J9622 Acute and chronic respiratory failure with hypercapnia: Principal | ICD-10-CM | POA: Diagnosis present

## 2024-10-30 DIAGNOSIS — W208XXA Other cause of strike by thrown, projected or falling object, initial encounter: Secondary | ICD-10-CM | POA: Diagnosis present

## 2024-10-30 DIAGNOSIS — E875 Hyperkalemia: Secondary | ICD-10-CM | POA: Diagnosis present

## 2024-10-30 DIAGNOSIS — E874 Mixed disorder of acid-base balance: Secondary | ICD-10-CM | POA: Diagnosis present

## 2024-10-30 DIAGNOSIS — I484 Atypical atrial flutter: Secondary | ICD-10-CM | POA: Diagnosis present

## 2024-10-30 DIAGNOSIS — E785 Hyperlipidemia, unspecified: Secondary | ICD-10-CM | POA: Diagnosis present

## 2024-10-30 DIAGNOSIS — I2510 Atherosclerotic heart disease of native coronary artery without angina pectoris: Secondary | ICD-10-CM | POA: Diagnosis present

## 2024-10-30 DIAGNOSIS — N1832 Chronic kidney disease, stage 3b: Secondary | ICD-10-CM | POA: Diagnosis present

## 2024-10-30 DIAGNOSIS — Z79890 Hormone replacement therapy (postmenopausal): Secondary | ICD-10-CM

## 2024-10-30 DIAGNOSIS — E119 Type 2 diabetes mellitus without complications: Secondary | ICD-10-CM

## 2024-10-30 DIAGNOSIS — Z8249 Family history of ischemic heart disease and other diseases of the circulatory system: Secondary | ICD-10-CM

## 2024-10-30 DIAGNOSIS — E039 Hypothyroidism, unspecified: Secondary | ICD-10-CM | POA: Diagnosis present

## 2024-10-30 DIAGNOSIS — I272 Pulmonary hypertension, unspecified: Secondary | ICD-10-CM | POA: Diagnosis present

## 2024-10-30 DIAGNOSIS — J441 Chronic obstructive pulmonary disease with (acute) exacerbation: Secondary | ICD-10-CM | POA: Diagnosis present

## 2024-10-30 DIAGNOSIS — G4733 Obstructive sleep apnea (adult) (pediatric): Secondary | ICD-10-CM | POA: Diagnosis present

## 2024-10-30 DIAGNOSIS — Z800 Family history of malignant neoplasm of digestive organs: Secondary | ICD-10-CM

## 2024-10-30 DIAGNOSIS — I495 Sick sinus syndrome: Secondary | ICD-10-CM | POA: Diagnosis present

## 2024-10-30 DIAGNOSIS — Z9981 Dependence on supplemental oxygen: Secondary | ICD-10-CM

## 2024-10-30 DIAGNOSIS — Z7984 Long term (current) use of oral hypoglycemic drugs: Secondary | ICD-10-CM

## 2024-10-30 DIAGNOSIS — Z950 Presence of cardiac pacemaker: Secondary | ICD-10-CM

## 2024-10-30 DIAGNOSIS — E66811 Obesity, class 1: Secondary | ICD-10-CM | POA: Diagnosis present

## 2024-10-30 DIAGNOSIS — I350 Nonrheumatic aortic (valve) stenosis: Secondary | ICD-10-CM | POA: Diagnosis present

## 2024-10-30 DIAGNOSIS — I130 Hypertensive heart and chronic kidney disease with heart failure and stage 1 through stage 4 chronic kidney disease, or unspecified chronic kidney disease: Secondary | ICD-10-CM | POA: Diagnosis present

## 2024-10-30 DIAGNOSIS — Z7901 Long term (current) use of anticoagulants: Secondary | ICD-10-CM | POA: Diagnosis not present

## 2024-10-30 DIAGNOSIS — R7989 Other specified abnormal findings of blood chemistry: Secondary | ICD-10-CM | POA: Diagnosis present

## 2024-10-30 DIAGNOSIS — I4891 Unspecified atrial fibrillation: Secondary | ICD-10-CM | POA: Diagnosis not present

## 2024-10-30 DIAGNOSIS — R6510 Systemic inflammatory response syndrome (SIRS) of non-infectious origin without acute organ dysfunction: Secondary | ICD-10-CM

## 2024-10-30 DIAGNOSIS — E1122 Type 2 diabetes mellitus with diabetic chronic kidney disease: Secondary | ICD-10-CM | POA: Diagnosis present

## 2024-10-30 DIAGNOSIS — Z881 Allergy status to other antibiotic agents status: Secondary | ICD-10-CM

## 2024-10-30 DIAGNOSIS — I5030 Unspecified diastolic (congestive) heart failure: Secondary | ICD-10-CM | POA: Diagnosis not present

## 2024-10-30 DIAGNOSIS — J9621 Acute and chronic respiratory failure with hypoxia: Principal | ICD-10-CM | POA: Diagnosis present

## 2024-10-30 DIAGNOSIS — Z888 Allergy status to other drugs, medicaments and biological substances status: Secondary | ICD-10-CM

## 2024-10-30 DIAGNOSIS — Z91041 Radiographic dye allergy status: Secondary | ICD-10-CM

## 2024-10-30 DIAGNOSIS — Z91018 Allergy to other foods: Secondary | ICD-10-CM

## 2024-10-30 DIAGNOSIS — Z96651 Presence of right artificial knee joint: Secondary | ICD-10-CM | POA: Diagnosis present

## 2024-10-30 DIAGNOSIS — I4819 Other persistent atrial fibrillation: Secondary | ICD-10-CM | POA: Diagnosis present

## 2024-10-30 DIAGNOSIS — S99921A Unspecified injury of right foot, initial encounter: Secondary | ICD-10-CM | POA: Diagnosis present

## 2024-10-30 DIAGNOSIS — Z823 Family history of stroke: Secondary | ICD-10-CM

## 2024-10-30 DIAGNOSIS — N179 Acute kidney failure, unspecified: Secondary | ICD-10-CM | POA: Diagnosis present

## 2024-10-30 DIAGNOSIS — Z833 Family history of diabetes mellitus: Secondary | ICD-10-CM

## 2024-10-30 DIAGNOSIS — Z9049 Acquired absence of other specified parts of digestive tract: Secondary | ICD-10-CM

## 2024-10-30 DIAGNOSIS — M1990 Unspecified osteoarthritis, unspecified site: Secondary | ICD-10-CM | POA: Diagnosis present

## 2024-10-30 DIAGNOSIS — T380X5A Adverse effect of glucocorticoids and synthetic analogues, initial encounter: Secondary | ICD-10-CM | POA: Diagnosis not present

## 2024-10-30 DIAGNOSIS — Z87891 Personal history of nicotine dependence: Secondary | ICD-10-CM

## 2024-10-30 DIAGNOSIS — Z91013 Allergy to seafood: Secondary | ICD-10-CM

## 2024-10-30 LAB — BLOOD GAS, VENOUS
Acid-Base Excess: 1.1 mmol/L (ref 0.0–2.0)
Acid-base deficit: 8.9 mmol/L — ABNORMAL HIGH (ref 0.0–2.0)
Bicarbonate: 20.1 mmol/L (ref 20.0–28.0)
Bicarbonate: 22.6 mmol/L (ref 20.0–28.0)
Drawn by: 68716
Drawn by: 65882
O2 Saturation: 12.5 %
O2 Saturation: 82.5 %
Patient temperature: 37.8
Patient temperature: 37.8
pCO2, Ven: 57 mmHg (ref 44–60)
pCO2, Ven: 28 mmHg — ABNORMAL LOW (ref 44–60)
pH, Ven: 7.16 — CL (ref 7.25–7.43)
pH, Ven: 7.52 — ABNORMAL HIGH (ref 7.25–7.43)
pO2, Ven: <31 mmHg — CL (ref 32–45)
pO2, Ven: 51 mmHg — ABNORMAL HIGH (ref 32–45)

## 2024-10-30 LAB — RESP PANEL BY RT-PCR (RSV, FLU A&B, COVID)  RVPGX2
Influenza A by PCR: NEGATIVE
Influenza B by PCR: NEGATIVE
Resp Syncytial Virus by PCR: NEGATIVE
SARS Coronavirus 2 by RT PCR: NEGATIVE

## 2024-10-30 LAB — COMPREHENSIVE METABOLIC PANEL WITH GFR
ALT: 76 U/L — ABNORMAL HIGH (ref 0–44)
AST: 66 U/L — ABNORMAL HIGH (ref 15–41)
Albumin: 4 g/dL (ref 3.5–5.0)
Alkaline Phosphatase: 78 U/L (ref 38–126)
Anion gap: 22 — ABNORMAL HIGH (ref 5–15)
BUN: 33 mg/dL — ABNORMAL HIGH (ref 8–23)
CO2: 19 mmol/L — ABNORMAL LOW (ref 22–32)
Calcium: 8.9 mg/dL (ref 8.9–10.3)
Chloride: 94 mmol/L — ABNORMAL LOW (ref 98–111)
Creatinine, Ser: 1.7 mg/dL — ABNORMAL HIGH (ref 0.61–1.24)
GFR, Estimated: 41 mL/min — ABNORMAL LOW (ref >60)
Glucose, Bld: 233 mg/dL — ABNORMAL HIGH (ref 70–99)
Potassium: 5.9 mmol/L — ABNORMAL HIGH (ref 3.5–5.1)
Sodium: 135 mmol/L (ref 135–145)
Total Bilirubin: 1.5 mg/dL — ABNORMAL HIGH (ref 0.0–1.2)
Total Protein: 6.5 g/dL (ref 6.5–8.1)

## 2024-10-30 LAB — URINALYSIS, W/ REFLEX TO CULTURE (INFECTION SUSPECTED)
Bacteria, UA: NONE SEEN
Bilirubin Urine: NEGATIVE
Glucose, UA: >=500 mg/dL — AB
Hgb urine dipstick: NEGATIVE
Ketones, ur: NEGATIVE mg/dL
Leukocytes,Ua: NEGATIVE
Nitrite: NEGATIVE
Protein, ur: 100 mg/dL — AB
Specific Gravity, Urine: 1.02 (ref 1.005–1.030)
pH: 5 (ref 5.0–8.0)

## 2024-10-30 LAB — CBC WITH DIFFERENTIAL/PLATELET
Abs Immature Granulocytes: 0.09 K/uL — ABNORMAL HIGH (ref 0.00–0.07)
Basophils Absolute: 0.1 K/uL (ref 0.0–0.1)
Basophils Relative: 1 %
Eosinophils Absolute: 0.2 K/uL (ref 0.0–0.5)
Eosinophils Relative: 2 %
HCT: 49.1 % (ref 39.0–52.0)
Hemoglobin: 15.8 g/dL (ref 13.0–17.0)
Immature Granulocytes: 1 %
Lymphocytes Relative: 32 %
Lymphs Abs: 3.8 K/uL (ref 0.7–4.0)
MCH: 30.3 pg (ref 26.0–34.0)
MCHC: 32.2 g/dL (ref 30.0–36.0)
MCV: 94.1 fL (ref 80.0–100.0)
Monocytes Absolute: 0.6 K/uL (ref 0.1–1.0)
Monocytes Relative: 5 %
Neutro Abs: 7.3 K/uL (ref 1.7–7.7)
Neutrophils Relative %: 59 %
Platelets: 171 K/uL (ref 150–400)
RBC: 5.22 MIL/uL (ref 4.22–5.81)
RDW: 15.2 % (ref 11.5–15.5)
WBC: 12 K/uL — ABNORMAL HIGH (ref 4.0–10.5)
nRBC: 0 % (ref 0.0–0.2)

## 2024-10-30 LAB — LACTIC ACID, PLASMA
Lactic Acid, Venous: 8.3 mmol/L (ref 0.5–1.9)
Lactic Acid, Venous: 3.1 mmol/L (ref 0.5–1.9)

## 2024-10-30 LAB — TROPONIN T, HIGH SENSITIVITY
Troponin T High Sensitivity: 59 ng/L — ABNORMAL HIGH (ref 0–19)
Troponin T High Sensitivity: 53 ng/L — ABNORMAL HIGH (ref 0–19)

## 2024-10-30 LAB — PROTIME-INR
INR: 2.4 — ABNORMAL HIGH (ref 0.8–1.2)
Prothrombin Time: 27.5 s — ABNORMAL HIGH (ref 11.4–15.2)

## 2024-10-30 LAB — PRO BRAIN NATRIURETIC PEPTIDE: Pro Brain Natriuretic Peptide: 9548 pg/mL — ABNORMAL HIGH (ref <300.0)

## 2024-10-30 LAB — MRSA NEXT GEN BY PCR, NASAL: MRSA by PCR Next Gen: NOT DETECTED

## 2024-10-30 MED ORDER — DILTIAZEM HCL ER COATED BEADS 120 MG PO CP24
120.0000 mg | ORAL_CAPSULE | Freq: Every day | ORAL | Status: DC
  Administered 2024-10-30 – 2024-11-03 (×5): 120 mg via ORAL
  Filled 2024-10-30 (×5): qty 1

## 2024-10-30 MED ORDER — ALBUTEROL SULFATE (2.5 MG/3ML) 0.083% IN NEBU
2.5000 mg | INHALATION_SOLUTION | Freq: Three times a day (TID) | RESPIRATORY_TRACT | Status: DC
  Administered 2024-10-30 – 2024-11-03 (×13): 2.5 mg via RESPIRATORY_TRACT
  Filled 2024-10-30 (×13): qty 3

## 2024-10-30 MED ORDER — AMIODARONE HCL IN DEXTROSE 360-4.14 MG/200ML-% IV SOLN: 30.0000 mg/h | INTRAVENOUS | Status: DC

## 2024-10-30 MED ORDER — SODIUM CHLORIDE 0.9 % IV BOLUS
1000.0000 mL | Freq: Once | INTRAVENOUS | Status: AC
  Administered 2024-10-30: 1000 mL via INTRAVENOUS

## 2024-10-30 MED ORDER — METHYLPREDNISOLONE SODIUM SUCC 40 MG IJ SOLR
40.0000 mg | Freq: Two times a day (BID) | INTRAMUSCULAR | Status: DC
  Administered 2024-10-30 – 2024-10-31 (×4): 40 mg via INTRAVENOUS
  Filled 2024-10-30 (×5): qty 1

## 2024-10-30 MED ORDER — RIVAROXABAN 15 MG PO TABS: 15.0000 mg | ORAL_TABLET | Freq: Every day | ORAL | Status: DC

## 2024-10-30 MED ORDER — SODIUM CHLORIDE 0.9 % IV SOLN
2.0000 g | Freq: Once | INTRAVENOUS | Status: DC
  Administered 2024-10-30: 2 g via INTRAVENOUS
  Filled 2024-10-30: qty 20

## 2024-10-30 MED ORDER — AMIODARONE LOAD VIA INFUSION: 150.0000 mg | Freq: Once | INTRAVENOUS | Status: DC

## 2024-10-30 MED ORDER — INSULIN ASPART 100 UNIT/ML IV SOLN
10.0000 [IU] | Freq: Once | INTRAVENOUS | Status: AC
  Administered 2024-10-30: 10 [IU] via INTRAVENOUS

## 2024-10-30 MED ORDER — ACETAMINOPHEN 325 MG PO TABS: 650.0000 mg | ORAL_TABLET | Freq: Four times a day (QID) | ORAL | Status: DC | PRN

## 2024-10-30 MED ORDER — SODIUM CHLORIDE 0.9 % IV SOLN
100.0000 mg | Freq: Two times a day (BID) | INTRAVENOUS | Status: DC
  Administered 2024-10-30 – 2024-11-01 (×6): 100 mg via INTRAVENOUS
  Filled 2024-10-30 (×9): qty 100

## 2024-10-30 MED ORDER — ACETAMINOPHEN 650 MG RE SUPP: 650.0000 mg | Freq: Four times a day (QID) | RECTAL | Status: DC | PRN

## 2024-10-30 MED ORDER — CHLORHEXIDINE GLUCONATE CLOTH 2 % EX PADS
6.0000 | MEDICATED_PAD | Freq: Every day | CUTANEOUS | Status: DC
  Administered 2024-10-30 – 2024-11-02 (×4): 6 via TOPICAL

## 2024-10-30 MED ORDER — SODIUM CHLORIDE 0.9 % IV SOLN
2.0000 g | Freq: Two times a day (BID) | INTRAVENOUS | Status: DC
  Administered 2024-10-30 – 2024-11-02 (×6): 2 g via INTRAVENOUS
  Filled 2024-10-30 (×5): qty 12.5

## 2024-10-30 MED ORDER — ATORVASTATIN CALCIUM 40 MG PO TABS
80.0000 mg | ORAL_TABLET | Freq: Every day | ORAL | Status: DC
  Administered 2024-10-31 – 2024-11-03 (×4): 80 mg via ORAL
  Filled 2024-10-30 (×4): qty 2

## 2024-10-30 MED ORDER — METHYLPREDNISOLONE SODIUM SUCC 40 MG IJ SOLR: 40.0000 mg | Freq: Two times a day (BID) | INTRAMUSCULAR | Status: DC

## 2024-10-30 MED ORDER — PANTOPRAZOLE SODIUM 40 MG PO TBEC
40.0000 mg | DELAYED_RELEASE_TABLET | Freq: Every day | ORAL | Status: DC
  Administered 2024-10-31 – 2024-11-03 (×4): 40 mg via ORAL
  Filled 2024-10-30 (×4): qty 1

## 2024-10-30 MED ORDER — IPRATROPIUM-ALBUTEROL 0.5-2.5 (3) MG/3ML IN SOLN: 3.0000 mL | Freq: Once | RESPIRATORY_TRACT | Status: DC

## 2024-10-30 MED ORDER — SODIUM CHLORIDE 0.9 % IV SOLN: 2.0000 g | Freq: Three times a day (TID) | INTRAVENOUS | Status: DC

## 2024-10-30 MED ORDER — BUDESON-GLYCOPYRROL-FORMOTEROL 160-9-4.8 MCG/ACT IN AERO
2.0000 | INHALATION_SPRAY | Freq: Two times a day (BID) | RESPIRATORY_TRACT | Status: DC
  Administered 2024-10-30 – 2024-11-03 (×8): 2 via RESPIRATORY_TRACT
  Filled 2024-10-30: qty 5.9

## 2024-10-30 MED ORDER — DEXTROSE 50 % IV SOLN
1.0000 | Freq: Once | INTRAVENOUS | Status: AC
  Administered 2024-10-30: 50 mL via INTRAVENOUS
  Filled 2024-10-30: qty 50

## 2024-10-30 MED ORDER — RIVAROXABAN 10 MG PO TABS
10.0000 mg | ORAL_TABLET | Freq: Every day | ORAL | Status: DC
  Administered 2024-10-30: 10 mg via ORAL
  Filled 2024-10-30 (×3): qty 1

## 2024-10-30 MED ORDER — LACTATED RINGERS IV SOLN: INTRAVENOUS | Status: DC

## 2024-10-30 MED ORDER — IPRATROPIUM-ALBUTEROL 0.5-2.5 (3) MG/3ML IN SOLN
3.0000 mL | Freq: Four times a day (QID) | RESPIRATORY_TRACT | Status: DC
Start: 2024-10-30 — End: 2024-10-30

## 2024-10-30 MED ORDER — AMIODARONE HCL IN DEXTROSE 360-4.14 MG/200ML-% IV SOLN: 60.0000 mg/h | INTRAVENOUS | Status: DC

## 2024-10-30 MED ORDER — BRENSOCATIB 25 MG PO TABS
25.0000 mg | ORAL_TABLET | Freq: Every day | ORAL | Status: DC
  Administered 2024-11-01 – 2024-11-03 (×3): 25 mg via ORAL
  Filled 2024-10-30: qty 1

## 2024-10-30 MED ORDER — SENNA 8.6 MG PO TABS: 1.0000 | ORAL_TABLET | Freq: Every day | ORAL | Status: DC | PRN

## 2024-10-30 MED ORDER — OMEPRAZOLE MAGNESIUM 20 MG PO TBEC: 20.0000 mg | DELAYED_RELEASE_TABLET | Freq: Every morning | ORAL | Status: DC

## 2024-10-30 MED ORDER — DILTIAZEM HCL-DEXTROSE 125-5 MG/125ML-% IV SOLN (PREMIX)
5.0000 mg/h | INTRAVENOUS | Status: DC
  Administered 2024-10-30: 5 mg/h via INTRAVENOUS
  Filled 2024-10-30 (×2): qty 125

## 2024-10-30 MED ORDER — CALCIUM GLUCONATE 10 % IV SOLN
1.0000 g | Freq: Once | INTRAVENOUS | Status: AC
  Administered 2024-10-30: 1 g via INTRAVENOUS
  Filled 2024-10-30: qty 10

## 2024-10-30 MED ORDER — DILTIAZEM HCL ER COATED BEADS 120 MG PO CP24: 120.0000 mg | ORAL_CAPSULE | Freq: Every day | ORAL | Status: DC

## 2024-10-30 MED ORDER — SODIUM CHLORIDE 0.9 % IV SOLN
2.0000 g | Freq: Three times a day (TID) | INTRAVENOUS | Status: DC
  Filled 2024-10-30: qty 12.5

## 2024-10-30 MED ORDER — COLLAGENASE 250 UNIT/GM EX OINT
TOPICAL_OINTMENT | Freq: Every day | CUTANEOUS | Status: DC
  Filled 2024-10-30: qty 30

## 2024-10-30 MED ORDER — LEVOTHYROXINE SODIUM 75 MCG PO TABS
150.0000 ug | ORAL_TABLET | Freq: Every day | ORAL | Status: DC
  Administered 2024-10-31 – 2024-11-03 (×4): 150 ug via ORAL
  Filled 2024-10-30 (×4): qty 2

## 2024-10-30 MED ORDER — CALCIUM GLUCONATE 10 % IV SOLN
INTRAVENOUS | Status: AC
  Filled 2024-10-30: qty 10

## 2024-10-30 NOTE — H&P (Signed)
 History and Physical    Clarence Dawson FMW:[id number] DOB: 04-27-1946 DOA: 10/30/2024  PCP: Sheryle Carwin, MD   Patient coming from: Home  I have personally briefly reviewed patient's old medical records in [facility]  Chief Complaint: Shortness of breath, hypoxia  HPI: Clarence Dawson is a 78 y.o. male with medical history significant of atrial fibrillation, COPD on 4 L of pulmonary oxygen  at home, permanent pacemaker, pulmonary hypertension who comes to the ER with complaints of shortness of breath.  He was found to be in respiratory distress, hypoxic by EMS satting 80% on nonrebreather mask.  Patient reports to be starting to feel short of breath yesterday which worsened overnight.  He also reports of some coughing with thick greenish sputum.  He also reports of feeling weak and having chills.  Denies any chest pain.  Reports palpitation.  Recently hospitalized and treated for pneumonia early October.  Has chronic A-fib on anticoagulation.  He was taken off Tikosyn  after recent hospitalization due to prolonged QTc.  Apparently he is planned for reload coming week.   ED Course: In the ER he was found to be wheezy and in respiratory distress.  He was in A-fib RVR and initiated on diltiazem  drip.  He is also placed on BiPAP with noticeable improvement. Received IV fluid per sepsis protocol, initiated on IV antibiotics in the ER  Laboratory work with sodium 135, potassium 5.9, chloride 94, BUN 33, creatinine 1.70, calcium  8.9, anion gap 22, proBNP 9540, troponin T 59, repeat in 1 hour 53, lactic acid on presentation 8.3, repeat 3.1.  White count 12, hemoglobin 15 platelet 171, INR 2.4, influenza A, influenza B, RSV, COVID-19 negative  Hospitalist service was called to evaluate the patient.  Review of Systems: As per HPI otherwise all other systems were reviewed and are negative.   Past Medical History:  Diagnosis Date   Allergic rhinitis    Aortic valve disorder    Asthma    since  childhood- seasonal allergies induced   Cancer (HCC)    Skin cancer- squamous, basal   Carotid artery stenosis    CHF (congestive heart failure) (HCC)    COPD (chronic obstructive pulmonary disease) (HCC)    Essential hypertension    Full dentures    GERD (gastroesophageal reflux disease)    H/O hiatal hernia    Hemorrhage of rectum    Hyperlipidemia    Hypothyroidism    Male circumcision    OSA (obstructive sleep apnea)    Osteoarthritis    Pacemaker    Oct 2005 in [place].   PAF (paroxysmal atrial fibrillation) (HCC)    Pneumonia    several Times 2015 last time   Primary localized osteoarthritis of right knee 08/11/2017   RBBB (right bundle branch block)    Sinoatrial node dysfunction (HCC)    Syncope    Tricuspid valve disorder    Type 2 diabetes mellitus (HCC)    Type II    Past Surgical History:  Procedure Laterality Date   A-V CARDIAC PACEMAKER INSERTION     Sick sinus syndrome DDR pacer   Arthropathy Right 2005   Rebuilding of left thumb and joint    CARDIAC CATHETERIZATION     CARDIAC ELECTROPHYSIOLOGY STUDY AND ABLATION  09/2008   for pvcs, Dr. Andreas   CARDIOVERSION N/A 12/18/2016   Procedure: CARDIOVERSION;  Surgeon: Vinie JAYSON Maxcy, MD;  Location: [facility] ENDOSCOPY;  Service: Cardiovascular;  Laterality: N/A;   CARDIOVERSION N/A 09/05/2021  Procedure: CARDIOVERSION;  Surgeon: Barbaraann Darryle Ned, MD;  Location: [facility] ENDOSCOPY;  Service: Cardiovascular;  Laterality: N/A;   CARDIOVERSION N/A 08/06/2022   Procedure: CARDIOVERSION;  Surgeon: Kate Lonni CROME, MD;  Location: [facility] ENDOSCOPY;  Service: Cardiovascular;  Laterality: N/A;   CARPAL TUNNEL RELEASE  1994   right wrist   CARPAL TUNNEL RELEASE  05/04/2012   Procedure: CARPAL TUNNEL RELEASE;  Surgeon: Arley JONELLE Curia, MD;  Location: [facility];  Service: Orthopedics;  Laterality: Left;   CARPOMETACARPEL SUSPENSION PLASTY Right 11/16/2014   Procedure: SUSPENSIONPLASTY RIGHT THUMB TENDON  TRANSFER ABDUCTOR POLLICUS LONGUS EXCISION TRAPEZIUM;  Surgeon: Arley Curia, MD;  Location: [facility];  Service: Orthopedics;  Laterality: Right;   CHOLECYSTECTOMY  1994   CIRCUMCISION     COLONOSCOPY N/A 03/14/2013   Procedure: COLONOSCOPY;  Surgeon: Lamar CHRISTELLA Hollingshead, MD;  [facility];  Service: Endoscopy;  Laterality: N/A;  8:15 AM   EYE SURGERY     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-[facility]   EYE SURGERY  2012   Left eye Corneal transplant- partial- Cataract   FLEXIBLE BRONCHOSCOPY Right 06/23/2019   Procedure: FLEXIBLE BRONCHOSCOPY RIGHT;  Surgeon: Verdia Art, MD;  Location: [facility] ORS;  Service: Pulmonary;  Laterality: Right;   GALLBLADDER SURGERY  12/01/2006   HAND TENDON SURGERY Left late 1990's   thumb   HEMORROIDECTOMY  2003   INJECTION KNEE Left 08/26/2017   Procedure: LEFT KNEE INJECTION;  Surgeon: Jane Lamar, MD;  Location: [facility] OR;  Service: Orthopedics;  Laterality: Left;   left Knee Arthroscopy     [facility]   PARTIAL KNEE ARTHROPLASTY  11/22/2012   Procedure: UNICOMPARTMENTAL KNEE;  Surgeon: Lamar DELENA Jane, MD;  Location: [facility] OR;  Service: Orthopedics;  Laterality: Left;  left unicompartmental knee arthroplasty   PERMANENT PACEMAKER GENERATOR CHANGE N/A 01/12/2013   Procedure: PERMANENT PACEMAKER GENERATOR CHANGE;  Surgeon: Danelle LELON Birmingham, MD;  Location: [facility] CATH LAB;  Service: Cardiovascular;  Laterality: N/A;   PPM GENERATOR CHANGEOUT N/A 07/07/2022   Procedure: PPM GENERATOR CHANGEOUT;  Surgeon: Birmingham Danelle LELON, MD;  Location: [facility] INVASIVE CV LAB;  Service: Cardiovascular;  Laterality: N/A;   RIGHT/LEFT HEART CATH AND CORONARY ANGIOGRAPHY N/A 08/28/2023   Procedure: RIGHT/LEFT HEART CATH AND CORONARY ANGIOGRAPHY;  Surgeon: Rolan Ezra RAMAN, MD;  Location: [facility] INVASIVE CV LAB;  Service: Cardiovascular;  Laterality: N/A;   Rotator cuff Surgery  2001   Right shoulder   TONSILLECTOMY     TOTAL KNEE ARTHROPLASTY Right  08/26/2017   Procedure: TOTAL KNEE ARTHROPLASTY;  Surgeon: Jane Lamar, MD;  Location: [facility] OR;  Service: Orthopedics;  Laterality: Right;   varicose vein reduction       reports that he quit smoking about 40 years ago. His smoking use included cigarettes. He started smoking about 66 years ago. He has a 77.9 pack-year smoking history. He has quit using smokeless tobacco.  His smokeless tobacco use included chew. He reports current alcohol  use of about 2.0 - 4.0 standard drinks of alcohol  per week. He reports that he does not use drugs.  Allergies  Allergen Reactions   Food Anaphylaxis and Shortness Of Breath    TREE NUTS   Iodinated Contrast Media Hives and Shortness Of Breath    Patient states hives to throat closing. (01/15/17: patient states this reaction was about 20 years ago with possibly an IVP.  He now says high doses of prednisone  throw me into AFib.  He has tolerated  CT arthrograms with Benadrly 50mg  PO one hour before injection.  Rudolph Loss, RN)   Shellfish Allergy  Anaphylaxis and Shortness Of Breath    To shellfish, crabs.  Makes him feel like things are crawling all over me.  Denies airway issues with these foods.  Arma Loss, RN 01/15/17)   Goat-Derived Products Hives    GOAT CHEESE    Prednisone  Palpitations    PRECIPITATES A-FIB   Diclofenac Sodium Other (See Comments)    Hives, buggy feeling all over   Ipratropium-Albuterol  Other (See Comments)    Tongue swelling   Metformin  And Related Diarrhea    High doses at once   Vancomycin  Anxiety    Red man syndrome   Voltaren [Diclofenac Sodium] Other (See Comments)    Feels like things are crawling on him    Family History  Problem Relation Age of Onset   Liver cancer Mother    Pancreatic cancer Mother    Colon cancer Mother    Hypertension Father    Stroke Father    Other Father 50       Sudden Cardiac death   Heart attack Father    Cancer Sister        brain   Diabetes Sister    Colon cancer Maternal  Aunt    Colon polyps Neg Hx     Prior to Admission medications   Medication Sig Start Date End Date Taking? Authorizing Provider  acetaminophen  (TYLENOL ) 500 MG tablet Take 1,000 mg by mouth 3 (three) times daily. Patient taking differently: Take 2,500 mg by mouth every morning.    [provider]  albuterol  (PROVENTIL ) (2.5 MG/3ML) 0.083% nebulizer solution INHALE ONE VIAL VIA NEBULIZER EVERY 6 HOURS AS NEEDED FOR WHEEZING OR SHORTNESS OF BREATH 09/01/24   Tamea Dedra CROME, MD  albuterol  (VENTOLIN  HFA) 108 (90 Base) MCG/ACT inhaler INHALE 2 PUFFS INTO THE LUNGS EVERY 4 HOURS AS NEEDED, ONLY IF YOU CAN'T CATCH YOUR BREATH 06/02/24   Tamea Dedra CROME, MD  atorvastatin  (LIPITOR) 80 MG tablet Take 1 tablet (80 mg total) by mouth daily. 04/15/24   Rolan Ezra RAMAN, MD  bisoprolol  (ZEBETA ) 5 MG tablet TAKE 1 AND 1/2 TABLET BY MOUTH TWICE DAILY. 04/17/17   Dow Arland BROCKS, NP  Brensocatib (BRINSUPRI) 25 MG TABS Take 25 mg by mouth daily. 10/04/24   Tamea Dedra CROME, MD  BREZTRI  AEROSPHERE 160-9-4.8 MCG/ACT AERO inhaler INHALE 2 PUFFS INTO THE LUNGS TWICE DAILY. 09/26/24   Tamea Dedra CROME, MD  Carboxymethylcellul-Glycerin (LUBRICATING EYE DROPS OP) Place 1 drop into the right eye 2 (two) times a week. Clear eyes as needed    [provider]  cephALEXin  (KEFLEX ) 500 MG capsule Take 1 capsule (500 mg total) by mouth 2 (two) times daily. 10/19/24   Stuart Vernell Norris, PA-C  chlorhexidine  (HIBICLENS ) 4 % external liquid Apply topically daily as needed. 10/19/24   Stuart Vernell Norris, PA-C  diltiazem  (CARDIZEM  CD) 120 MG 24 hr capsule TAKE (1) CAPSULE BY MOUTH DAILY, MAY TAKE A EXTRA CAPSULE DAILY FOR BREAKTHROUGH AFIB. 12/09/23   Waddell Danelle ORN, MD  Dupilumab  (DUPIXENT ) 300 MG/2ML SOAJ Inject 300 mg into the skin every 14 (fourteen) days. 10/05/24   Tamea Dedra CROME, MD  Ensifentrine  (OHTUVAYRE ) 3 MG/2.5ML SUSP Take 3 mg by nebulization 2 (two) times daily. 06/13/24   Tamea Dedra CROME, MD  EPINEPHrine  0.3 mg/0.3 mL IJ SOAJ injection Inject 0.3 mg into the muscle as needed for anaphylaxis. 11/05/20   Tamea,  Dedra CROME, MD  ezetimibe  (ZETIA ) 10 MG tablet Take 1 tablet (10 mg total) by mouth daily. 06/20/24 10/13/24  Rolan Ezra RAMAN, MD  fluticasone  (FLONASE ) 50 MCG/ACT nasal spray Place 2 sprays into both nostrils daily. 10/21/23   Tobie Eldora NOVAK, MD  furosemide  (LASIX ) 40 MG tablet Take 1.5 tablets (60 mg total) by mouth 2 (two) times daily. 06/10/24   Rolan Ezra RAMAN, MD  glipiZIDE  (GLUCOTROL  XL) 5 MG 24 hr tablet Take 5 mg by mouth daily before breakfast.    [provider]  HYDROcodone -acetaminophen  (NORCO/VICODIN) 5-325 MG tablet Take 1 tablet by mouth every 6 (six) hours as needed. 09/26/24   [provider]  JARDIANCE  25 MG TABS tablet Take 25 mg by mouth daily before breakfast. 04/11/21   [provider]  LANTUS  100 UNIT/ML injection Inject 10 Units into the skin every morning. 09/20/24   [provider]  levothyroxine  (SYNTHROID ) 150 MCG tablet Take 150 mcg by mouth daily before breakfast. 06/01/23   [provider]  losartan  (COZAAR ) 50 MG tablet Take 50 mg by mouth at bedtime. 04/27/20   [provider]  metFORMIN  (GLUCOPHAGE -XR) 500 MG 24 hr tablet Take 500 mg by mouth in the morning and at bedtime.    [provider]  Multiple Vitamin (MULTIVITAMIN) tablet Take 1 tablet by mouth daily.    [provider]  mupirocin  ointment (BACTROBAN ) 2 % Apply 1 Application topically daily. 10/19/24   Stuart Vernell Norris, PA-C  Nebulizers (PARI LC PLUS NEBULIZER) MISC Use with nebulized medications as directed. Replace tubing every 6 months. 10/10/24   Tamea Dedra CROME, MD  omeprazole  (PRILOSEC  OTC) 20 MG tablet Take 20 mg by mouth every morning.    [provider]  PRESCRIPTION MEDICATION 2 (two) times daily. Thumper vest    [provider]  Respiratory Therapy Supplies (FLUTTER) DEVI  Use as directed 10/10/19   Tamea Dedra CROME, MD  rivaroxaban  (XARELTO ) 20 MG TABS tablet Take 20 mg by mouth at bedtime.    [provider]  sodium chloride  (OCEAN) 0.65 % SOLN nasal spray Place 1 spray into both nostrils as needed for congestion.    [provider]  spironolactone  (ALDACTONE ) 25 MG tablet Take 0.5 tablets (12.5 mg total) by mouth daily. 06/10/24   Rolan Ezra RAMAN, MD    Physical Exam: Vitals:   10/30/24 1015 10/30/24 1030 10/30/24 1041 10/30/24 1054  BP: 126/78   (!) 88/51  Pulse: (!) 109 (!) 119 (!) 108 100  Resp: (!) 8 14 13    Temp:      TempSrc:      SpO2: 99% 98% 94% 92%  Weight:      Height:        Constitutional: NAD, calm, comfortable Vitals:   10/30/24 1015 10/30/24 1030 10/30/24 1041 10/30/24 1054  BP: 126/78   (!) 88/51  Pulse: (!) 109 (!) 119 (!) 108 100  Resp: (!) 8 14 13    Temp:      TempSrc:      SpO2: 99% 98% 94% 92%  Weight:      Height:         Labs on Admission: I have personally reviewed following labs and imaging studies  CBC: Recent Labs  Lab 10/30/24 0833  WBC 12.0*  NEUTROABS 7.3  HGB 15.8  HCT 49.1  MCV 94.1  PLT 171   Basic Metabolic Panel: Recent Labs  Lab 10/30/24 0838  NA 135  K 5.9*  CL  94*  CO2 19*  GLUCOSE 233*  BUN 33*  CREATININE 1.70*  CALCIUM  8.9   GFR: Estimated Creatinine Clearance: 40.2 mL/min (A) (by C-G formula based on SCr of 1.7 mg/dL (H)). Liver Function Tests: Recent Labs  Lab 10/30/24 0838  AST 66*  ALT 76*  ALKPHOS 78  BILITOT 1.5*  PROT 6.5  ALBUMIN 4.0   No results for input(s): LIPASE, AMYLASE in the last 168 hours. No results for input(s): AMMONIA in the last 168 hours. Coagulation Profile: Recent Labs  Lab 10/30/24 0838  INR 2.4*   Cardiac Enzymes: No results for input(s): CKTOTAL, CKMB, CKMBINDEX, TROPONINI in the last 168 hours. BNP (last 3 results) Recent Labs    09/30/24 2031 10/04/24 0828 10/30/24 0833  PROBNP 15,139.0*  8,248.0* 9,548.0*   HbA1C: No results for input(s): HGBA1C in the last 72 hours. CBG: No results for input(s): GLUCAP in the last 168 hours. Lipid Profile: No results for input(s): CHOL, HDL, LDLCALC, TRIG, CHOLHDL, LDLDIRECT in the last 72 hours. Thyroid  Function Tests: No results for input(s): TSH, T4TOTAL, FREET4, T3FREE, THYROIDAB in the last 72 hours. Anemia Panel: No results for input(s): VITAMINB12, FOLATE, FERRITIN, TIBC, IRON, RETICCTPCT in the last 72 hours. Urine analysis:    Component Value Date/Time   COLORURINE YELLOW 09/30/2024 2230   APPEARANCEUR CLEAR 09/30/2024 2230   LABSPEC 1.016 09/30/2024 2230   PHURINE 5.0 09/30/2024 2230   GLUCOSEU >=500 (A) 09/30/2024 2230   HGBUR NEGATIVE 09/30/2024 2230   BILIRUBINUR NEGATIVE 09/30/2024 2230   KETONESUR NEGATIVE 09/30/2024 2230   PROTEINUR 30 (A) 09/30/2024 2230   UROBILINOGEN 1.0 11/15/2012 1051   NITRITE NEGATIVE 09/30/2024 2230   LEUKOCYTESUR NEGATIVE 09/30/2024 2230    Radiological Exams on Admission: DG Chest Port 1 View Result Date: 10/30/2024 EXAM: 1 VIEW XRAY OF THE CHEST 10/30/2024 08:55:38 AM COMPARISON: None available. CLINICAL HISTORY: Shob FINDINGS: LUNGS AND PLEURA: No focal pulmonary opacity. No pulmonary edema. No pleural effusion. No pneumothorax. Emphysema again noted. HEART AND MEDIASTINUM: Left pacemaker in place. No acute abnormality of the cardiac and mediastinal silhouettes. BONES AND SOFT TISSUES: No acute osseous abnormality. IMPRESSION: 1. No acute cardiopulmonary process. 2. Emphysema. Electronically signed by: Norleen Kil MD 10/30/2024 09:18 AM EST RP Workstation: [id number]    EKG: Independently reviewed.  A-fib RVR  Assessment/Plan  78 year old male with multiple comorbidities including atrial fibrillation, chronic respiratory failure on 4 L/min oxygen , COPD, pulmonary hypertension, permanent pacemaker comes in for evaluation of worsening shortness of  breath/wheezing, acute on chronic respiratory failure, A-fib RVR,  Acute on chronic hypoxic respiratory failure COPD exacerbation  -Increasing cough, shortness of breath with purulent sputum. -Initially required BiPAP in the ER but quickly transitioned to 5 L/min nasal cannula prior to transfer to stepdown Will start IV Solu-Medrol , albuterol  nebulizer, supportive care IV cefepime and doxycycline  Will obtain a sputum culture Follow-up blood culture  Lactic acidosis: Likely from hypoxia versus sepsis.  This has improved.  Chronic atrial fibrillation with RVR - A-fib with RVR, started on Cardizem  drip however this will be discontinued due to low blood pressure.  Heart rate currently is in 100s.  Will continue to monitor. - May need to consider amiodarone drip if rate uncontrolled. - Tikosyn  was recently discontinued, plan to reload coming week. - Consider cardiology consult tomorrow  History of pulmonary hypertension - On Lasix  60 mg twice daily at home.  Will hold today  Lower extremity wound - Right lower extremity wound secondary to trauma.  Does not look infected.  Continue local wound care  Hypothyroidism  Status post pacemaker  Resume home medications as appropriate once reconciled  DVT prophylaxis: Chronic anticoagulation Code Status: Full code Family Communication: Family at the bedside Disposition Plan: Home Consults called:  Admission status: Patient  Severity of Illness: The appropriate patient status for this patient is INPATIENT. Inpatient status is judged to be reasonable and necessary in order to provide the required intensity of service to ensure the patient's safety. The patient's presenting symptoms, physical exam findings, and initial radiographic and laboratory data in the context of their chronic comorbidities is felt to place them at high risk for further clinical deterioration. Furthermore, it is not anticipated that the patient will be medically stable  for discharge from the hospital within 2 midnights of admission.   * I certify that at the point of admission it is my clinical judgment that the patient will require inpatient hospital care spanning beyond 2 midnights from the point of admission due to high intensity of service, high risk for further deterioration and high frequency of surveillance required.DEWAINE Derryl Duval MD [facility]  10/30/2024, 10:56 AM

## 2024-10-30 NOTE — Plan of Care (Signed)
  Problem: Education: Goal: Knowledge of General Education information will improve Description: Including pain rating scale, medication(s)/side effects and non-pharmacologic comfort measures Outcome: Progressing   Problem: Health Behavior/Discharge Planning: Goal: Ability to manage health-related needs will improve Outcome: Progressing   Problem: Clinical Measurements: Goal: Will remain free from infection Outcome: Progressing Goal: Diagnostic test results will improve Outcome: Progressing   Problem: Nutrition: Goal: Adequate nutrition will be maintained Outcome: Progressing   Problem: Skin Integrity: Goal: Risk for impaired skin integrity will decrease Outcome: Progressing

## 2024-10-30 NOTE — ED Triage Notes (Signed)
 Pt brb EMS, c/o respiratory distress; green sputum noted; onset yesterday. O2 not reading; EMS reports last o2 at 78; provider in room at triage.

## 2024-10-30 NOTE — Sepsis Progress Note (Signed)
 Provider and RN notified patients fluid resuscitation = . Patient 1st lactate 8.3.

## 2024-10-30 NOTE — ED Provider Notes (Addendum)
 [place] EMERGENCY DEPARTMENT AT [facility] Provider Note   CSN: [id number] Arrival date & time: 10/30/24  9183     Patient presents with: Respiratory Distress   Clarence Dawson is a 78 y.o. male.   HPI     78 year old male with a history of COPD, bronchiectasis, pulm hypertension, sinus node dysfunction status post PPM, chronic respiratory failure on 4 L, persistent atrial fibrillation, and obstructive sleep apnea on CPAP presenting with shortness of breath and respiratory distress.   Patient reports that he started getting short of breath yesterday.  Symptoms worsened overnight.  Per EMS, patient was wheezing, he was hypoxic in the 60s.  They put him on nonrebreather because his blood pressure was soft.  Patient has been coughing green phlegm.  Patient is noted to be tachycardic with heart rate in the 130s.  He is reporting that he has chest heaviness that started yesterday, off and on.  He has no CAD.  Patient has history of A-fib and his Tikosyn  was discontinued during his last admission.  Patient states that he has been in persistent A-fib since Tikosyn  was discontinued.  He has been taking his Xarelto  as prescribed.  No history of PE.  Patient reports subjective fevers.  Patient noted to have swelling to the right lower extremity.  He reports that about 2 weeks ago he dropped an oxygen  tank on his foot and has been managing dressing over there.  Swelling is because of tight dressing he has applied.  Prior to Admission medications   Medication Sig Start Date End Date Taking? Authorizing Provider  acetaminophen  (TYLENOL ) 500 MG tablet Take 1,000 mg by mouth 3 (three) times daily. Patient taking differently: Take 2,500 mg by mouth every morning.    [provider]  albuterol  (PROVENTIL ) (2.5 MG/3ML) 0.083% nebulizer solution INHALE ONE VIAL VIA NEBULIZER EVERY 6 HOURS AS NEEDED FOR WHEEZING OR SHORTNESS OF BREATH 09/01/24   Tamea Dedra CROME, MD  albuterol   (VENTOLIN  HFA) 108 (90 Base) MCG/ACT inhaler INHALE 2 PUFFS INTO THE LUNGS EVERY 4 HOURS AS NEEDED, ONLY IF YOU CAN'T CATCH YOUR BREATH 06/02/24   Tamea Dedra CROME, MD  atorvastatin  (LIPITOR) 80 MG tablet Take 1 tablet (80 mg total) by mouth daily. 04/15/24   Rolan Ezra RAMAN, MD  bisoprolol  (ZEBETA ) 5 MG tablet TAKE 1 AND 1/2 TABLET BY MOUTH TWICE DAILY. 04/17/17   Dow Arland BROCKS, NP  Brensocatib (BRINSUPRI) 25 MG TABS Take 25 mg by mouth daily. 10/04/24   Tamea Dedra CROME, MD  BREZTRI  AEROSPHERE 160-9-4.8 MCG/ACT AERO inhaler INHALE 2 PUFFS INTO THE LUNGS TWICE DAILY. 09/26/24   Tamea Dedra CROME, MD  Carboxymethylcellul-Glycerin (LUBRICATING EYE DROPS OP) Place 1 drop into the right eye 2 (two) times a week. Clear eyes as needed    [provider]  cephALEXin  (KEFLEX ) 500 MG capsule Take 1 capsule (500 mg total) by mouth 2 (two) times daily. 10/19/24   Stuart Vernell Norris, PA-C  chlorhexidine  (HIBICLENS ) 4 % external liquid Apply topically daily as needed. 10/19/24   Stuart Vernell Norris, PA-C  diltiazem  (CARDIZEM  CD) 120 MG 24 hr capsule TAKE (1) CAPSULE BY MOUTH DAILY, MAY TAKE A EXTRA CAPSULE DAILY FOR BREAKTHROUGH AFIB. 12/09/23   Waddell Danelle ORN, MD  Dupilumab  (DUPIXENT ) 300 MG/2ML SOAJ Inject 300 mg into the skin every 14 (fourteen) days. 10/05/24   Tamea Dedra CROME, MD  Ensifentrine  (OHTUVAYRE ) 3 MG/2.5ML SUSP Take 3 mg by nebulization 2 (two) times daily. 06/13/24  Tamea Dedra CROME, MD  EPINEPHrine  0.3 mg/0.3 mL IJ SOAJ injection Inject 0.3 mg into the muscle as needed for anaphylaxis. 11/05/20   Tamea Dedra CROME, MD  ezetimibe  (ZETIA ) 10 MG tablet Take 1 tablet (10 mg total) by mouth daily. 06/20/24 10/13/24  Rolan Ezra RAMAN, MD  fluticasone  (FLONASE ) 50 MCG/ACT nasal spray Place 2 sprays into both nostrils daily. 10/21/23   Tobie Eldora NOVAK, MD  furosemide  (LASIX ) 40 MG tablet Take 1.5 tablets (60 mg total) by mouth 2 (two) times daily. 06/10/24   Rolan Ezra RAMAN, MD   glipiZIDE  (GLUCOTROL  XL) 5 MG 24 hr tablet Take 5 mg by mouth daily before breakfast.    [provider]  HYDROcodone -acetaminophen  (NORCO/VICODIN) 5-325 MG tablet Take 1 tablet by mouth every 6 (six) hours as needed. 09/26/24   [provider]  JARDIANCE  25 MG TABS tablet Take 25 mg by mouth daily before breakfast. 04/11/21   [provider]  LANTUS  100 UNIT/ML injection Inject 10 Units into the skin every morning. 09/20/24   [provider]  levothyroxine  (SYNTHROID ) 150 MCG tablet Take 150 mcg by mouth daily before breakfast. 06/01/23   [provider]  losartan  (COZAAR ) 50 MG tablet Take 50 mg by mouth at bedtime. 04/27/20   [provider]  metFORMIN  (GLUCOPHAGE -XR) 500 MG 24 hr tablet Take 500 mg by mouth in the morning and at bedtime.    [provider]  Multiple Vitamin (MULTIVITAMIN) tablet Take 1 tablet by mouth daily.    [provider]  mupirocin  ointment (BACTROBAN ) 2 % Apply 1 Application topically daily. 10/19/24   Stuart Vernell Norris, PA-C  Nebulizers (PARI LC PLUS NEBULIZER) MISC Use with nebulized medications as directed. Replace tubing every 6 months. 10/10/24   Tamea Dedra CROME, MD  omeprazole  (PRILOSEC  OTC) 20 MG tablet Take 20 mg by mouth every morning.    [provider]  PRESCRIPTION MEDICATION 2 (two) times daily. Thumper vest    [provider]  Respiratory Therapy Supplies (FLUTTER) DEVI Use as directed 10/10/19   Tamea Dedra CROME, MD  rivaroxaban  (XARELTO ) 20 MG TABS tablet Take 20 mg by mouth at bedtime.    [provider]  sodium chloride  (OCEAN) 0.65 % SOLN nasal spray Place 1 spray into both nostrils as needed for congestion.    [provider]  spironolactone  (ALDACTONE ) 25 MG tablet Take 0.5 tablets (12.5 mg total) by mouth daily. 06/10/24   Rolan Ezra RAMAN, MD    Allergies: Food, Iodinated contrast media, Shellfish allergy , Goat-derived products,  Prednisone , Diclofenac sodium, Ipratropium-albuterol , Metformin  and related, Vancomycin , and Voltaren [diclofenac sodium]    Review of Systems  All other systems reviewed and are negative.   Updated Vital Signs BP (!) 88/51   Pulse 100   Temp 100.1 F (37.8 C) (Axillary)   Resp 13   Ht 5' 8 (1.727 m)   Wt 95.6 kg   SpO2 92%   BMI 32.05 kg/m   Physical Exam Vitals and nursing note reviewed.  Constitutional:      General: He is in acute distress.     Appearance: He is well-developed. He is not diaphoretic.  HENT:     Head: Atraumatic.  Cardiovascular:     Rate and Rhythm: Tachycardia present.  Pulmonary:     Effort: Respiratory distress present.     Breath sounds: Wheezing present. No rales.     Comments: Inspiratory wheezing, no rales Musculoskeletal:        General:  Swelling present.     Cervical back: Neck supple.     Comments: Right lower extremity swelling noted.  Patient has an ulcerated wound measuring about 4 cm in diameter in the distal tibia.  Skin:    General: Skin is warm.  Neurological:     Mental Status: He is alert and oriented to person, place, and time.     (all labs ordered are listed, but only abnormal results are displayed) Labs Reviewed  PRO BRAIN NATRIURETIC PEPTIDE - Abnormal; Notable for the following components:      Result Value   Pro Brain Natriuretic Peptide 9,548.0 (*)    All other components within normal limits  CBC WITH DIFFERENTIAL/PLATELET - Abnormal; Notable for the following components:   WBC 12.0 (*)    Abs Immature Granulocytes 0.09 (*)    All other components within normal limits  BLOOD GAS, VENOUS - Abnormal; Notable for the following components:   pH, Ven 7.16 (*)    pO2, Ven <31 (*)    Acid-base deficit 8.9 (*)    All other components within normal limits  LACTIC ACID, PLASMA - Abnormal; Notable for the following components:   Lactic Acid, Venous 8.3 (*)    All other components within normal limits  PROTIME-INR -  Abnormal; Notable for the following components:   Prothrombin Time 27.5 (*)    INR 2.4 (*)    All other components within normal limits  COMPREHENSIVE METABOLIC PANEL WITH GFR - Abnormal; Notable for the following components:   Potassium 5.9 (*)    Chloride 94 (*)    CO2 19 (*)    Glucose, Bld 233 (*)    BUN 33 (*)    Creatinine, Ser 1.70 (*)    AST 66 (*)    ALT 76 (*)    Total Bilirubin 1.5 (*)    GFR, Estimated 41 (*)    Anion gap 22 (*)    All other components within normal limits  LACTIC ACID, PLASMA - Abnormal; Notable for the following components:   Lactic Acid, Venous 3.1 (*)    All other components within normal limits  BLOOD GAS, VENOUS - Abnormal; Notable for the following components:   pH, Ven 7.52 (*)    pCO2, Ven 28 (*)    pO2, Ven 51 (*)    All other components within normal limits  TROPONIN T, HIGH SENSITIVITY - Abnormal; Notable for the following components:   Troponin T High Sensitivity 59 (*)    All other components within normal limits  TROPONIN T, HIGH SENSITIVITY - Abnormal; Notable for the following components:   Troponin T High Sensitivity 53 (*)    All other components within normal limits  RESP PANEL BY RT-PCR (RSV, FLU A&B, COVID)  RVPGX2  CULTURE, BLOOD (ROUTINE X 2)  CULTURE, BLOOD (ROUTINE X 2)  URINALYSIS, W/ REFLEX TO CULTURE (INFECTION SUSPECTED)    EKG: EKG Interpretation Date/Time:  Sunday October 30 2024 08:26:59 EST Ventricular Rate:  161 PR Interval:  76 QRS Duration:  155 QT Interval:  313 QTC Calculation: 483 R Axis:   165  Text Interpretation: wide complex tacycardia afib with rvr suspected Nonspecific ST and T wave abnormality Confirmed by Charlyn Sora ([id number]) on 10/30/2024 9:48:40 AM  Radiology: [facility] Chest Port 1 View Result Date: 10/30/2024 EXAM: 1 VIEW XRAY OF THE CHEST 10/30/2024 08:55:38 AM COMPARISON: None available. CLINICAL HISTORY: [place] FINDINGS: LUNGS AND PLEURA: No focal pulmonary opacity. No pulmonary edema. No  pleural effusion. No  pneumothorax. Emphysema again noted. HEART AND MEDIASTINUM: Left pacemaker in place. No acute abnormality of the cardiac and mediastinal silhouettes. BONES AND SOFT TISSUES: No acute osseous abnormality. IMPRESSION: 1. No acute cardiopulmonary process. 2. Emphysema. Electronically signed by: Norleen Kil MD 10/30/2024 09:18 AM EST RP Workstation: [id number]     .Critical Care  Performed by: Charlyn Sora, MD Authorized by: Charlyn Sora, MD   Critical care provider statement:    Critical care time (minutes):  78   Critical care was necessary to treat or prevent imminent or life-threatening deterioration of the following conditions:  Respiratory failure, renal failure, circulatory failure, cardiac failure and sepsis   Critical care was time spent personally by me on the following activities:  Development of treatment plan with patient or surrogate, discussions with consultants, evaluation of patient's response to treatment, examination of patient, ordering and review of laboratory studies, ordering and review of radiographic studies, ordering and performing treatments and interventions, pulse oximetry, re-evaluation of patient's condition, review of old charts and obtaining history from patient or surrogate    Medications Ordered in the ED  ipratropium-albuterol  (DUONEB) 0.5-2.5 (3) MG/3ML nebulizer solution 3 mL (3 mLs Nebulization Not Given 10/30/24 0831)  lactated ringers  infusion ( Intravenous New Bag/Given 10/30/24 0923)  doxycycline  (VIBRAMYCIN ) 100 mg in sodium chloride  0.9 % 250 mL IVPB (100 mg Intravenous New Bag/Given 10/30/24 1004)  diltiazem  (CARDIZEM ) 125 mg in dextrose  5% 125 mL (1 mg/mL) infusion (5 mg/hr Intravenous New Bag/Given 10/30/24 0858)  ceFEPIme (MAXIPIME) 2 g in sodium chloride  0.9 % 100 mL IVPB (has no administration in time range)  sodium chloride  0.9 % bolus 1,000 mL (has no administration in time range)  insulin  aspart (novoLOG ) injection 10 Units  (has no administration in time range)    And  dextrose  50 % solution 50 mL (has no administration in time range)  calcium  gluconate inj 10% (1 g) URGENT USE ONLY! (has no administration in time range)  sodium chloride  0.9 % bolus 1,000 mL (1,000 mLs Intravenous New Bag/Given 10/30/24 1003)    Clinical Course as of 10/30/24 1057  Sun Oct 30, 2024  0950 DG Chest [place] 1 View Independently interpreted patient's chest x-ray.  No significant pulmonary edema. [AN]  0951 Blood gas, venous (at [facility] and [facility])(!!) Patient has profound acidosis, but pCO2 is 57.  Would have expected pCO2 higher for her to be primary respiratory acidosis.  Lactate pending.  Anticipate lactate to be elevated because of work of breathing. [AN]  [id number] I consulted cardiology and spoke with Dr. Inocencio. Particularly the question was if cardioversion would be even a good option given that patient states that he has been in persistent A-fib since Tikosyn  was discontinued, and if he had any recommendations on chemical cardioversion.  Dr. Inocencio recommends electrical cardioversion is a better choice at this time.  He is also thinking it would be reasonable to admit patient to [facility] by hospitalist.  Patient however might need ER to ER transfer given the capacity constraints at inpatient [facility]. [AN]  1043 Creatinine(!): 1.70 Labs reveal AKI. Patient has hyper kalemia with potassium 5.9. We will give him bicarb, which should help him with the acidosis and also hyperkalemia.   [AN]  1043 Lactic Acid, Venous(!!): 8.3 Elevated lactate, multifactorial.  Work of breathing likely contributing heavily to this high number.    Patient has BMI over 30.  His ideal weight is 68.5 kg, therefore we will give him 2 L of IV fluid, which will account  to 30 cc/kg based on his ideal body weight. [AN]  1044 ECG Heart Rate(!): 109 Patient reassessed.  Heart rate and the blood pressure has stabilized and improved.  Heart rate specifically has come down.   He is on 5mg /h of diltiazem  at this time.  Given the significant improvement, likely can stay at EP. [AN]  1045 Blood gas, venous (at [facility] and [facility])(!) pH is are radically different.  He went from profound acidosis to alkalosis.  Clinically he is doing better.  Will order ABG if hospitalist wants. [AN]  1048 Sepsis hemodynamic reassessment completed. [AN]  1056 BNP is elevated.  However, he does not appear profoundly volume overloaded.  Blood pressure has been on the low side, he is not cool to touch.  I do not think clinically he is in cardiogenic shock.  Because of his fever, and improvement of blood pressure with fluid, we will give him a second liter.  But he will not need any more fluid afterwards as bolus.  Medicine will admit.  We have decided to take him off of BiPAP. [AN]    Clinical Course User Index [AN] Charlyn Sora, MD                                 Medical Decision Making Amount and/or Complexity of Data Reviewed Labs: ordered. Decision-making details documented in ED Course. Radiology: ordered. Decision-making details documented in ED Course.  Risk OTC drugs. Prescription drug management. Decision regarding hospitalization.   78 year old patient comes in with chief complaint of shortness of breath.  Patient noted to have low-grade fever, A-fib with RVR upon arrival.  He has known history of A-fib on Xarelto , COPD on chronic 4 L of oxygen , diastolic CHF.  He was recently admitted for pneumonia.  It appears that Tikosyn  was discontinued at that time.  I have reviewed patient's records including recent discharge summary, medications and also echocardiogram. It is not entirely clear why Tikosyn  was discontinued.  Differential diagnosis for this patient includes acute pulmonary edema, symptomatic A-fib due to RVR, A-fib with RVR secondary to pneumonia or cellulitis, acute coronary syndrome, pulmonary edema, pleural effusion.  Plan is to initiate BiPAP.  Patient has mild  wheezing.  He has already received Solu-Medrol  and DuoNeb.  We will give him additional albuterol  here.  Anticipate admission.  We will start him on Dilt infusion at this time given soft blood pressures.  Final diagnoses:  Acute on chronic respiratory failure with hypoxia and hypercapnia (HCC)  Atrial fibrillation with rapid ventricular response (HCC)  SIRS (systemic inflammatory response syndrome) (HCC)  AKI (acute kidney injury)  Acute hyperkalemia    ED Discharge Orders     None          Charlyn Sora, MD 10/30/24 1056    Charlyn Sora, MD 10/30/24 1057

## 2024-10-30 NOTE — Sepsis Progress Note (Signed)
 eLink Sepsis tracking per protocol.

## 2024-10-30 NOTE — ED Notes (Signed)
 Removed BiPAP after discussion with RT. Converted to 4 LPM [place].    10/30/24 1041  Vitals  Pulse Rate (!) 108  ECG Heart Rate (!) 109  Resp 13  MEWS COLOR  MEWS Score Color Yellow  Oxygen  Therapy  SpO2 94 %  MEWS Score  MEWS Temp 0  MEWS Systolic 0  MEWS Pulse 1  MEWS RR 1  MEWS LOC 0  MEWS Score 2

## 2024-10-30 NOTE — ED Notes (Signed)
 ABX started after 2nc BC set drawn.

## 2024-10-30 NOTE — Consult Note (Signed)
 WOC Nurse Consult Note: on review of EMR patient presented to urgent care 10/22 with complaints of skin tear to R lower leg after hitting leg on oxygen  tank, patient on Xarelto  and had difficulty getting wound to stop bleeding; appears to be using Mupirocin  and non stick dressings at home  Reason for Consult: R lower leg wound  Wound type: full thickness post trauma as above in a patient with chronic edema and ? Venous insufficiency  Pressure Injury POA: NA  Measurement: see nursing flowsheet  Wound bed: 80% dark hemorrhagic/eschar 20% tan  Drainage (amount, consistency, odor) see nursing flowsheet  Periwound: edema  Dressing procedure/placement/frequency: Cleanse R lower leg wound with Vashe wound cleanser, do not rinse and allow to air dry.  Apply 1/4 thick layer of Santyl to wound bed, top with saline moist gauze, dry gauze and silicone foam or Kerlix roll gauze whichever is preferred/works best.   POC discussed with bedside nurse. WOC team will not follow. Re-consult if further needs arise.   Patient may benefit from ongoing management of wound by wound care center.    Thank you,    Powell Bar MSN, RN-BC, [employer]

## 2024-10-31 ENCOUNTER — Ambulatory Visit (HOSPITAL_COMMUNITY): Admitting: Physician Assistant

## 2024-10-31 ENCOUNTER — Encounter (HOSPITAL_COMMUNITY): Payer: Self-pay | Admitting: Hospitalist

## 2024-10-31 ENCOUNTER — Inpatient Hospital Stay: Admit: 2024-10-31

## 2024-10-31 ENCOUNTER — Encounter (HOSPITAL_COMMUNITY): Payer: Self-pay

## 2024-10-31 DIAGNOSIS — I5030 Unspecified diastolic (congestive) heart failure: Secondary | ICD-10-CM | POA: Diagnosis not present

## 2024-10-31 DIAGNOSIS — N1832 Chronic kidney disease, stage 3b: Secondary | ICD-10-CM

## 2024-10-31 DIAGNOSIS — I4819 Other persistent atrial fibrillation: Secondary | ICD-10-CM

## 2024-10-31 DIAGNOSIS — I350 Nonrheumatic aortic (valve) stenosis: Secondary | ICD-10-CM

## 2024-10-31 DIAGNOSIS — J441 Chronic obstructive pulmonary disease with (acute) exacerbation: Secondary | ICD-10-CM | POA: Diagnosis not present

## 2024-10-31 LAB — COMPREHENSIVE METABOLIC PANEL WITH GFR
ALT: 105 U/L — ABNORMAL HIGH (ref 0–44)
AST: 60 U/L — ABNORMAL HIGH (ref 15–41)
Albumin: 3.4 g/dL — ABNORMAL LOW (ref 3.5–5.0)
Alkaline Phosphatase: 85 U/L (ref 38–126)
Anion gap: 12 (ref 5–15)
BUN: 34 mg/dL — ABNORMAL HIGH (ref 8–23)
CO2: 22 mmol/L (ref 22–32)
Calcium: 8.6 mg/dL — ABNORMAL LOW (ref 8.9–10.3)
Chloride: 102 mmol/L (ref 98–111)
Creatinine, Ser: 1.36 mg/dL — ABNORMAL HIGH (ref 0.61–1.24)
GFR, Estimated: 53 mL/min — ABNORMAL LOW (ref >60)
Glucose, Bld: 334 mg/dL — ABNORMAL HIGH (ref 70–99)
Potassium: 5.1 mmol/L (ref 3.5–5.1)
Sodium: 136 mmol/L (ref 135–145)
Total Bilirubin: 0.7 mg/dL (ref 0.0–1.2)
Total Protein: 5.6 g/dL — ABNORMAL LOW (ref 6.5–8.1)

## 2024-10-31 LAB — CBC
HCT: 40.7 % (ref 39.0–52.0)
Hemoglobin: 13.6 g/dL (ref 13.0–17.0)
MCH: 30.8 pg (ref 26.0–34.0)
MCHC: 33.4 g/dL (ref 30.0–36.0)
MCV: 92.3 fL (ref 80.0–100.0)
Platelets: 123 K/uL — ABNORMAL LOW (ref 150–400)
RBC: 4.41 MIL/uL (ref 4.22–5.81)
RDW: 15.1 % (ref 11.5–15.5)
WBC: 7 K/uL (ref 4.0–10.5)
nRBC: 0 % (ref 0.0–0.2)

## 2024-10-31 LAB — GLUCOSE, CAPILLARY
Glucose-Capillary: 448 mg/dL — ABNORMAL HIGH (ref 70–99)
Glucose-Capillary: 449 mg/dL — ABNORMAL HIGH (ref 70–99)
Glucose-Capillary: 403 mg/dL — ABNORMAL HIGH (ref 70–99)
Glucose-Capillary: 351 mg/dL — ABNORMAL HIGH (ref 70–99)

## 2024-10-31 LAB — EXPECTORATED SPUTUM ASSESSMENT W GRAM STAIN, RFLX TO RESP C

## 2024-10-31 MED ORDER — FUROSEMIDE 10 MG/ML IJ SOLN
40.0000 mg | Freq: Every day | INTRAMUSCULAR | Status: DC
  Administered 2024-10-31 – 2024-11-02 (×3): 40 mg via INTRAVENOUS
  Filled 2024-10-31 (×3): qty 4

## 2024-10-31 MED ORDER — INSULIN ASPART 100 UNIT/ML IJ SOLN: 0.0000 [IU] | Freq: Three times a day (TID) | INTRAMUSCULAR | Status: DC

## 2024-10-31 MED ORDER — ALBUTEROL SULFATE (2.5 MG/3ML) 0.083% IN NEBU: 2.5000 mg | INHALATION_SOLUTION | RESPIRATORY_TRACT | Status: DC | PRN

## 2024-10-31 MED ORDER — INSULIN GLARGINE-YFGN 100 UNIT/ML ~~LOC~~ SOLN
20.0000 [IU] | Freq: Every day | SUBCUTANEOUS | Status: DC
  Administered 2024-10-31: 20 [IU] via SUBCUTANEOUS
  Filled 2024-10-31 (×3): qty 0.2

## 2024-10-31 MED ORDER — INSULIN ASPART 100 UNIT/ML IJ SOLN
0.0000 [IU] | Freq: Three times a day (TID) | INTRAMUSCULAR | Status: DC
  Administered 2024-11-01: 11 [IU] via SUBCUTANEOUS
  Administered 2024-11-01: 15 [IU] via SUBCUTANEOUS
  Administered 2024-11-02: 8 [IU] via SUBCUTANEOUS
  Filled 2024-10-31 (×3): qty 1

## 2024-10-31 MED ORDER — BISOPROLOL FUMARATE 5 MG PO TABS
2.5000 mg | ORAL_TABLET | Freq: Every day | ORAL | Status: DC
  Administered 2024-10-31 – 2024-11-01 (×2): 2.5 mg via ORAL
  Filled 2024-10-31 (×2): qty 1

## 2024-10-31 MED ORDER — RIVAROXABAN 20 MG PO TABS
20.0000 mg | ORAL_TABLET | Freq: Every day | ORAL | Status: DC
  Administered 2024-10-31 – 2024-11-03 (×4): 20 mg via ORAL
  Filled 2024-10-31 (×3): qty 1

## 2024-10-31 MED ORDER — INSULIN ASPART 100 UNIT/ML IJ SOLN
0.0000 [IU] | Freq: Every day | INTRAMUSCULAR | Status: DC
  Administered 2024-10-31 – 2024-11-01 (×2): 5 [IU] via SUBCUTANEOUS
  Filled 2024-10-31: qty 1

## 2024-10-31 MED ORDER — INSULIN ASPART 100 UNIT/ML IJ SOLN
15.0000 [IU] | Freq: Once | INTRAMUSCULAR | Status: AC
  Administered 2024-10-31: 15 [IU] via SUBCUTANEOUS

## 2024-10-31 MED ORDER — INSULIN ASPART 100 UNIT/ML IJ SOLN: 0.0000 [IU] | Freq: Every day | INTRAMUSCULAR | Status: DC

## 2024-10-31 NOTE — Inpatient Diabetes Management (Signed)
 Inpatient Diabetes Program Recommendations  AACE/ADA: New Consensus Statement on Inpatient Glycemic Control   Target Ranges:  Prepandial:   less than 140 mg/dL      Peak postprandial:   less than 180 mg/dL (1-2 hours)      Critically ill patients:  140 - 180 mg/dL    Latest Reference Range & Units 10/31/24 09:42  Glucose-Capillary 70 - 99 mg/dL 551 (H)    Latest Reference Range & Units 10/30/24 08:38 10/31/24 04:19  Glucose 70 - 99 mg/dL 766 (H) 665 (H)    Review of Glycemic Control  Diabetes history: DM2 Outpatient Diabetes medications: Glipizide  5 mg QAM, Jardiance  25 mg QAM, Metformin  XR 500 mg BID, Lantus  10 units QAM Current orders for Inpatient glycemic control: Semglee  20 units daily, Novolog  0-9 units TID with meals; Solumedrol 40 mg BID  Inpatient Diabetes Program Recommendations:    Insulin : Noted Semglee  20 units daily ordered today and given this morning at 9:42 am.  Please consider adding Novolog  0-5 units at bedtime. If steroids are continued and post prandial glucose is consistently over 180 mg/dl, please consider adding Novolog  meal coverage insulin .  Thanks, Earnie Gainer, RN, MSN, [facility] Diabetes Coordinator Inpatient Diabetes Program [phone] (Team Pager from 8am to 5pm)

## 2024-10-31 NOTE — Consult Note (Signed)
 CARDIOLOGY CONSULTATION  Patient ID: Clarence Dawson; [id number]; 08/18/1946   Admit date: 10/30/2024 Date of Consult: 10/31/2024  Primary Care Provider: Sheryle Carwin, MD Primary Cardiologist: Danelle Birmingham, MD  HISTORY OF PRESENT ILLNESS  Clarence Dawson is a 78 y.o. male with a history of paroxysmal to persistent atrial fibrillation, HFpEF with RV dysfunction, COPD and chronic bronchiectasis with hypoxic respiratory failure, St. Jude pacemaker in place for treatment of sinus node dysfunction, and nonobstructive CAD by cardiac catheterization in 2024.  He is currently admitted with acute on chronic hypoxic respiratory failure in the setting of COPD exacerbation.  Recent increasing shortness of breath with productive cough, no obvious fevers or chills, less responsive to MDIs at home.  He was found to be in atrial fibrillation/atypical atrial flutter with RVR on presentation.  Chart reviewed.  He was recently seen in the atrial fibrillation clinic with plan for admission to reinitiate Tikosyn .  He had been recently admitted in early October with community-acquired pneumonia and sepsis during which time Tikosyn  was discontinued in the setting of QT prolongation and AKI.  He has been managed with heart rate control strategy recently along with anticoagulation.  Initially placed on intravenous diltiazem , now converted back to Cardizem  CD as of this morning.  He was also on bisoprolol  at home.  Recent echocardiogram in early October showed LVEF 55 to 60% with moderate RV dysfunction and severe pulmonary hypertension with RVSP estimated 61 mmHg.  He has mild to moderate aortic stenosis as well with mean AV gradient 14 mmHg.  ROS  Pertinent review in history of present illness.  Chronic lower leg swelling, right greater than left (prior treatment for varicose veins).  Recently with NYHA class II-III dyspnea.  Past Medical History:  Diagnosis Date   Allergic rhinitis    Aortic valve disorder    Asthma     since childhood- seasonal allergies induced   Cancer (HCC)    Skin cancer- squamous, basal   Carotid artery stenosis    CHF (congestive heart failure) (HCC)    COPD (chronic obstructive pulmonary disease) (HCC)    Essential hypertension    Full dentures    GERD (gastroesophageal reflux disease)    H/O hiatal hernia    Hemorrhage of rectum    Hyperlipidemia    Hypothyroidism    Male circumcision    OSA (obstructive sleep apnea)    Osteoarthritis    Pacemaker    Oct 2005 in [place].   PAF (paroxysmal atrial fibrillation) (HCC)    Pneumonia    several Times 2015 last time   Primary localized osteoarthritis of right knee 08/11/2017   RBBB (right bundle branch block)    Sinoatrial node dysfunction (HCC)    Syncope    Tricuspid valve disorder    Type 2 diabetes mellitus (HCC)    Type II    Past Surgical History:  Procedure Laterality Date   A-V CARDIAC PACEMAKER INSERTION     Sick sinus syndrome DDR pacer   Arthropathy Right 2005   Rebuilding of left thumb and joint    CARDIAC CATHETERIZATION     CARDIAC ELECTROPHYSIOLOGY STUDY AND ABLATION  09/2008   for pvcs, Dr. Andreas   CARDIOVERSION N/A 12/18/2016   Procedure: CARDIOVERSION;  Surgeon: Vinie JAYSON Maxcy, MD;  Location: [facility] ENDOSCOPY;  Service: Cardiovascular;  Laterality: N/A;   CARDIOVERSION N/A 09/05/2021   Procedure: CARDIOVERSION;  Surgeon: Barbaraann Darryle Ned, MD;  Location: [facility] ENDOSCOPY;  Service: Cardiovascular;  Laterality: N/A;   CARDIOVERSION  N/A 08/06/2022   Procedure: CARDIOVERSION;  Surgeon: Kate Lonni CROME, MD;  Location: [facility] ENDOSCOPY;  Service: Cardiovascular;  Laterality: N/A;   CARPAL TUNNEL RELEASE  1994   right wrist   CARPAL TUNNEL RELEASE  05/04/2012   Procedure: CARPAL TUNNEL RELEASE;  Surgeon: Arley JONELLE Curia, MD;  Location: [facility];  Service: Orthopedics;  Laterality: Left;   CARPOMETACARPEL SUSPENSION PLASTY Right 11/16/2014   Procedure: SUSPENSIONPLASTY RIGHT THUMB  TENDON TRANSFER ABDUCTOR POLLICUS LONGUS EXCISION TRAPEZIUM;  Surgeon: Arley Curia, MD;  Location: [facility];  Service: Orthopedics;  Laterality: Right;   CHOLECYSTECTOMY  1994   CIRCUMCISION     COLONOSCOPY N/A 03/14/2013   Procedure: COLONOSCOPY;  Surgeon: Lamar CHRISTELLA Hollingshead, MD;  [facility];  Service: Endoscopy;  Laterality: N/A;  8:15 AM   EYE SURGERY     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-[facility]   EYE SURGERY  2012   Left eye Corneal transplant- partial- Cataract   FLEXIBLE BRONCHOSCOPY Right 06/23/2019   Procedure: FLEXIBLE BRONCHOSCOPY RIGHT;  Surgeon: Verdia Art, MD;  Location: [facility] ORS;  Service: Pulmonary;  Laterality: Right;   GALLBLADDER SURGERY  12/01/2006   HAND TENDON SURGERY Left late 1990's   thumb   HEMORROIDECTOMY  2003   INJECTION KNEE Left 08/26/2017   Procedure: LEFT KNEE INJECTION;  Surgeon: Jane Lamar, MD;  Location: [facility] OR;  Service: Orthopedics;  Laterality: Left;   left Knee Arthroscopy     [facility]   PARTIAL KNEE ARTHROPLASTY  11/22/2012   Procedure: UNICOMPARTMENTAL KNEE;  Surgeon: Lamar DELENA Jane, MD;  Location: [facility] OR;  Service: Orthopedics;  Laterality: Left;  left unicompartmental knee arthroplasty   PERMANENT PACEMAKER GENERATOR CHANGE N/A 01/12/2013   Procedure: PERMANENT PACEMAKER GENERATOR CHANGE;  Surgeon: Danelle LELON Birmingham, MD;  Location: [facility] CATH LAB;  Service: Cardiovascular;  Laterality: N/A;   PPM GENERATOR CHANGEOUT N/A 07/07/2022   Procedure: PPM GENERATOR CHANGEOUT;  Surgeon: Birmingham Danelle LELON, MD;  Location: [facility] INVASIVE CV LAB;  Service: Cardiovascular;  Laterality: N/A;   RIGHT/LEFT HEART CATH AND CORONARY ANGIOGRAPHY N/A 08/28/2023   Procedure: RIGHT/LEFT HEART CATH AND CORONARY ANGIOGRAPHY;  Surgeon: Rolan Ezra RAMAN, MD;  Location: [facility] INVASIVE CV LAB;  Service: Cardiovascular;  Laterality: N/A;   Rotator cuff Surgery  2001   Right shoulder   TONSILLECTOMY     TOTAL KNEE ARTHROPLASTY Right  08/26/2017   Procedure: TOTAL KNEE ARTHROPLASTY;  Surgeon: Jane Lamar, MD;  Location: [facility] OR;  Service: Orthopedics;  Laterality: Right;   varicose vein reduction       INPATIENT MEDICATIONS Scheduled Meds:  albuterol   2.5 mg Nebulization TID   atorvastatin   80 mg Oral Daily   bisoprolol   2.5 mg Oral Daily   Brensocatib  25 mg Oral Daily   budesonide -glycopyrrolate -formoterol   2 puff Inhalation BID   Chlorhexidine  Gluconate Cloth  6 each Topical Q0600   collagenase   Topical Daily   diltiazem   120 mg Oral Daily   insulin  glargine-yfgn  20 Units Subcutaneous Daily   levothyroxine   150 mcg Oral QAC breakfast   methylPREDNISolone  (SOLU-MEDROL ) injection  40 mg Intravenous BID   pantoprazole   40 mg Oral Daily   rivaroxaban   20 mg Oral Q supper   Continuous Infusions:  ceFEPime (MAXIPIME) IV 2 g (10/31/24 0153)   doxycycline  (VIBRAMYCIN ) IV 100 mg (10/30/24 2110)   PRN Meds: acetaminophen  **OR** acetaminophen , albuterol , senna  ALLERGIES Allergies  Allergen Reactions   Food Anaphylaxis  and Shortness Of Breath    Tree nuts   Iodinated Contrast Media Hives and Shortness Of Breath    Patient states hives to throat closing. (01/15/17: patient states this reaction was about 20 years ago with possibly an IVP.  He now says high doses of prednisone  throw me into AFib.  He has tolerated CT arthrograms with Benadryl  50mg  PO one hour before injection.  Rudolph Loss, RN)   Shellfish Allergy  Anaphylaxis and Shortness Of Breath    To shellfish, crabs.  Makes him feel like things are crawling all over me.  Denies airway issues with these foods.  Arma Loss, RN 01/15/17)   Goat-Derived Products Hives    Goat cheese   Ipratropium-Albuterol  Swelling and Other (See Comments)    Tongue swelling   Metformin  And Related Diarrhea    High doses at once   Prednisone  Palpitations    Precipitates A-Fib   Diclofenac Sodium Hives and Other (See Comments)    Hives, buggy feeling all over    Vancomycin  Anxiety and Other (See Comments)    Red man syndrome   Voltaren [Diclofenac Sodium] Other (See Comments)    Feels like things are crawling on him    SOCIAL HISTORY  Social History   Tobacco Use   Smoking status: Former    Current packs/day: 0.00    Average packs/day: 3.0 packs/day for 26.0 years (77.9 ttl pk-yrs)    Types: Cigarettes    Start date: 02/20/1958    Quit date: 02/12/1984    Years since quitting: 40.7   Smokeless tobacco: Former    Types: Chew   Tobacco comments:    Former smoker 08/30/2021  Substance Use Topics   Alcohol  use: Yes    Alcohol /week: 2.0 - 4.0 standard drinks of alcohol     Types: 1 - 2 Glasses of wine, 1 - 2 Cans of beer per week    Comment: 1 glass of wine or beer 3 times a week 12/26/22    FAMILY HISTORY   The patient's family history includes Cancer in his sister; Colon cancer in his maternal aunt and mother; Diabetes in his sister; Heart attack in his father; Hypertension in his father; Liver cancer in his mother; Other (age of onset: 43) in his father; Pancreatic cancer in his mother; Stroke in his father. There is no history of Colon polyps.  PHYSICAL EXAM & DATA  Vitals:   10/31/24 0000 10/31/24 0324 10/31/24 0353 10/31/24 0806  BP: 118/75     Pulse: (!) 102 78    Resp: 14 12    Temp: 97.7 F (36.5 C)  97.7 F (36.5 C) 97.9 F (36.6 C)  TempSrc: Axillary  Axillary Oral  SpO2: 100% 100%    Weight:      Height:        Intake/Output Summary (Last 24 hours) at 10/31/2024 0833 Last data filed at 10/31/2024 9191 Gross per 24 hour  Intake 1923.17 ml  Output 1700 ml  Net 223.17 ml   Filed Weights   10/30/24 0828 10/30/24 1000 10/30/24 1157  Weight: 95.6 kg 95.6 kg 93.9 kg   Body mass index is 30.57 kg/m.   Gen: Patient is in no acute distress. HEENT: Conjunctiva and lids normal, oropharynx clear. Neck: Supple, no elevated JVP or carotid bruits. Lungs: Prolonged expiratory phase with wheeze and scattered rhonchi. Cardiac:  Irregularly irregular, soft systolic murmur, no gallop. Abdomen: Soft, nontender, bowel sounds present. Extremities: SCDs in place, mild right lower leg edema. Skin: Warm and  dry. Musculoskeletal: No kyphosis. Neuropsychiatric: Alert and oriented x3, affect grossly appropriate.  EKG:  An ECG dated 10/30/2024 was personally reviewed today and demonstrated:  Probable atypical atrial flutter with RVR, right bundle branch block.  Telemetry:  I personally reviewed telemetry which shows atrial fibrillation with heart rate 90s to low 100s.  RELEVANT CV STUDIES  Echocardiogram 10/01/2024:  1. No LV thrombus by Definity. Left ventricular ejection fraction, by  estimation, is 55 to 60%. The left ventricle has normal function. Left  ventricular endocardial border not optimally defined to evaluate regional  wall motion. Left ventricular  diastolic parameters are indeterminate.   2. Right ventricular systolic function is moderately reduced. The right  ventricular size is severely enlarged. There is severely elevated  pulmonary artery systolic pressure. The estimated right ventricular  systolic pressure is 60.7 mmHg.   3. The mitral valve is normal in structure. No evidence of mitral valve  regurgitation. No evidence of mitral stenosis.   4. The aortic valve was not well visualized. Aortic valve regurgitation  is not visualized. Mild to moderate aortic valve stenosis. Aortic valve  area, by VTI measures 1.03 cm. Aortic valve mean gradient measures 14.0  mmHg. Aortic valve Vmax measures  2.45 m/s.   5. The inferior vena cava is dilated in size with >50% respiratory  variability, suggesting right atrial pressure of 8 mmHg.   LABORATORY DATA  Chemistry Recent Labs  Lab 10/30/24 0838 10/31/24 0419  NA 135 136  K 5.9* 5.1  CL 94* 102  CO2 19* 22  GLUCOSE 233* 334*  BUN 33* 34*  CREATININE 1.70* 1.36*  CALCIUM  8.9 8.6*  GFRNONAA 41* 53*  ANIONGAP 22* 12    Recent Labs  Lab  10/30/24 0838 10/31/24 0419  PROT 6.5 5.6*  ALBUMIN 4.0 3.4*  AST 66* 60*  ALT 76* 105*  ALKPHOS 78 85  BILITOT 1.5* 0.7   Hematology Recent Labs  Lab 10/30/24 0833 10/31/24 0419  WBC 12.0* 7.0  RBC 5.22 4.41  HGB 15.8 13.6  HCT 49.1 40.7  MCV 94.1 92.3  MCH 30.3 30.8  MCHC 32.2 33.4  RDW 15.2 15.1  PLT 171 123*    BNP Recent Labs  Lab 10/30/24 0833  PROBNP 9,548.0*    Lipid Panel     Component Value Date/Time   CHOL 142 06/20/2024 0822   TRIG 155 (H) 06/20/2024 0822   HDL 36 (L) 06/20/2024 0822   CHOLHDL 3.9 06/20/2024 0822   VLDL 31 06/20/2024 0822   LDLCALC 75 06/20/2024 0822    RADIOLOGY/STUDIES DG Chest Port 1 View Result Date: 10/30/2024 EXAM: 1 VIEW XRAY OF THE CHEST 10/30/2024 08:55:38 AM COMPARISON: None available. CLINICAL HISTORY: Shob FINDINGS: LUNGS AND PLEURA: No focal pulmonary opacity. No pulmonary edema. No pleural effusion. No pneumothorax. Emphysema again noted. HEART AND MEDIASTINUM: Left pacemaker in place. No acute abnormality of the cardiac and mediastinal silhouettes. BONES AND SOFT TISSUES: No acute osseous abnormality. IMPRESSION: 1. No acute cardiopulmonary process. 2. Emphysema. Electronically signed by: Norleen Kil MD 10/30/2024 09:18 AM EST RP Workstation: [id number]    ASSESSMENT & PLAN  1.  Persistent atrial fibrillation presenting with RVR, likely atypical atrial flutter as well.  CHA2DS2-VASc score is 6.  He is on Xarelto  20 mg daily for stroke prophylaxis (creatinine clearance 59).  Recent records reviewed, he was taken off Tikosyn  during admission earlier in October in the setting of QT prolongation with sepsis and AKI.  Had planned to be admitted this week for  reinitiation of Tikosyn , however now readmitted with acute on chronic hypoxic respiratory failure and COPD exacerbation.  Home medications include Cardizem  CD 120 mg daily and bisoprolol  7.5 mg twice daily.  2.  HFpEF with RV dysfunction.  Echocardiogram in early October  shows LVEF 55 to 60%, moderate RV dysfunction with severe pulmonary hypertension, RVSP estimated 61 mmHg.  Also likely part of clinical presentation as well.  3.  Mild to moderate aortic stenosis, mean AV gradient 14 mmHg.  4.  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  5.  Nonobstructive CAD by cardiac catheterization in 2024.  6.  CKD stage IIIb, recent creatinine 1.7 down to 1.36, GFR 41 to 53.  At this point would focus on heart rate control and anticoagulation during concurrent management of COPD exacerbation.  Would not be a good time for reinitiation of Tikosyn  until pulmonary status has stabilized.  Continue Cardizem  CD 120 mg daily.  Will resume low-dose bisoprolol  (cardioselective) and titrate as tolerated, otherwise continue Xarelto  20 mg daily.  Also starting on IV Lasix , track urine output and renal function.  For questions or updates, please contact  [facility] Please consult [email or web address] for contact info under   Signed, Jayson Sierras, MD  10/31/2024 8:33 AM

## 2024-10-31 NOTE — TOC Initial Note (Signed)
 Transition of Care [facility]) - Initial/Assessment Note    Patient Details  Name: Clarence Dawson MRN: [id number] Date of Birth: [birth year]  Transition of [facility]) CM/SW Contact:    Mcarthur Saddie Kim, LCSW Phone Number: 10/31/2024, 7:59 AM  Clinical Narrative: Pt admitted with acute on chronic hypoxic respiratory failure. Assessment completed due to high risk readmission score. Pt reports he lives with his sister and brother-in-law. He is independent with ADLs. Pt is on 4.5L home O2 ([employer]). He drives himself to appointments. Pt plans to return home when medically stable. TOC will continue to follow.                   Expected Discharge Plan: Home/Self Care Barriers to Discharge: Continued Medical Work up   Patient Goals and CMS Choice Patient states their goals for this hospitalization and ongoing recovery are:: return home   Choice offered to / list presented to : Patient [place] ownership interest in [facility].provided to::  (n/a)    Expected Discharge Plan and Services In-house Referral: Clinical Social Work     Living arrangements for the past 2 months: Single Family Home                                      Prior Living Arrangements/Services Living arrangements for the past 2 months: Single Family Home Lives with:: Siblings Patient language and need for interpreter reviewed:: Yes Do you feel safe going back to the place where you live?: Yes      Need for Family Participation in Patient Care: No (Comment)   Current home services: DME (O2, cane walker) Criminal Activity/Legal Involvement Pertinent to Current Situation/Hospitalization: No - Comment as needed  Activities of Daily Living   ADL Screening (condition at time of admission) Independently performs ADLs?: Yes (appropriate for developmental age) Is the patient deaf or have difficulty hearing?: No Does the patient have difficulty seeing, even when wearing glasses/contacts?: No Does  the patient have difficulty concentrating, remembering, or making decisions?: No  Permission Sought/Granted                  Emotional Assessment     Affect (typically observed): Appropriate Orientation: : Oriented to Self, Oriented to Place, Oriented to  Time, Oriented to Situation Alcohol  / Substance Use: Not Applicable Psych Involvement: No (comment)  Admission diagnosis:  Acute hyperkalemia [E87.5] COPD exacerbation (HCC) [J44.1] SIRS (systemic inflammatory response syndrome) (HCC) [R65.10] Atrial fibrillation with rapid ventricular response (HCC) [I48.91] AKI (acute kidney injury) [N17.9] Acute on chronic respiratory failure with hypoxia and hypercapnia (HCC) [G03.78, J96.22] Patient Active Problem List   Diagnosis Date Noted   COPD exacerbation (HCC) 10/30/2024   Sepsis due to undetermined organism (HCC) 10/03/2024   Hyperkalemia 10/01/2024   Type 2 diabetes mellitus with hyperglycemia (HCC) 10/01/2024   AKI (acute kidney injury) 10/01/2024   Obesity, Class I, BMI 30-34.9 10/01/2024   Lactic acidosis 04/07/2024   Elevated brain natriuretic peptide (BNP) level 04/07/2024   COPD (chronic obstructive pulmonary disease) (HCC) 04/07/2024   Elevated troponin 04/07/2024   Prolonged QT interval 04/07/2024   Atrial fibrillation, chronic (HCC) 04/07/2024   Acquired hypothyroidism 04/07/2024   Encounter for monitoring dofetilide  therapy 02/25/2024   Chronic respiratory failure with hypoxia (HCC) 06/12/2023   Hypercoagulable state due to persistent atrial fibrillation (HCC) 01/02/2020   Acute on chronic diastolic CHF (congestive heart failure) (HCC) 11/22/2019  Paroxysmal atrial fibrillation (HCC) 09/22/2019   Atrial fibrillation with RVR (HCC)    Lobar pneumonia 09/21/2019   Acute respiratory failure with hypoxia (HCC) 09/21/2019   Acute on chronic respiratory failure with hypoxia (HCC) 09/18/2019   Elevated lactic acid level    GERD (gastroesophageal reflux disease)     Chronic bronchitis (HCC) 02/09/2019   Sepsis due to pneumonia (HCC) 03/03/2018   Primary localized osteoarthritis of right knee 08/11/2017   Acute exacerbation of COPD with asthma (HCC) 12/08/2016   Chest pain 10/20/2016   Essential hypertension 10/20/2016   Morbid obesity due to excess calories (HCC) 06/24/2016   Severe persistent asthma (HCC) 02/12/2016   Upper airway cough syndrome 01/24/2016   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   Persistent atrial fibrillation    Syncope    Hyperthyroidism    Mixed hyperlipidemia    RBBB (right bundle branch block)    Tricuspid valve disorder    Aortic valve disorder    Carotid artery stenosis    Type 2 diabetes mellitus (HCC)    Pacemaker    OSA on CPAP    Arthritis    Sinoatrial node dysfunction (HCC)    PVC's (premature ventricular contractions) 07/17/2011   Essential hypertension, benign 01/24/2011   Premature ventricular contractions 01/24/2011   PPM-St.Jude 01/24/2011   PCP:  Sheryle Carwin, MD Pharmacy:   [facility] - [place], [place] - [address] [place] [address] Phone: [phone] Fax: [phone]  [facility] - [place], [place] - [address] [place] [address] Phone: [phone] Fax: [phone]  [facility] - [place], [place] - [address] [place] [address] Phone: [phone] Fax: [phone]  [facility] [place], [place] - [address] [place] [address] Phone: [phone] Fax: [phone]  [facility] [place] [address] [place] [address] Phone: [phone] Fax: [phone]  [facility] - [address] [place], [place] - [address] [place] [address] Phone: [phone] Fax: [phone]     Social Drivers of Health (SDOH) Social History: SDOH Screenings   Food  Insecurity: No Food Insecurity (10/30/2024)  Housing: Low Risk  (10/30/2024)  Transportation Needs: No Transportation Needs (10/30/2024)  Utilities: Not At Risk (10/30/2024)  Depression (PHQ2-9): Low Risk  (09/03/2023)  Social Connections: Socially Isolated (10/30/2024)  Tobacco Use: Medium Risk (10/19/2024)   SDOH Interventions:     Readmission Risk Interventions    10/31/2024    7:58 AM 10/01/2024   11:37 AM 04/06/2024   10:08 PM  Readmission Risk Prevention Plan  Post Dischage Appt   Complete  Medication Screening   Complete  Transportation Screening Complete Complete Complete  HRI or Home Care Consult Complete Complete   Social Work Consult for Recovery Care Planning/Counseling Complete Complete   Palliative Care Screening Not Applicable Not Applicable   Medication Review Oceanographer) Complete Complete

## 2024-10-31 NOTE — Plan of Care (Signed)

## 2024-10-31 NOTE — Progress Notes (Addendum)
 PROGRESS NOTE    Clarence Dawson  FMW:[id number] DOB: 1946-03-09 DOA: 10/30/2024 PCP: Sheryle Carwin, MD   Brief Narrative:   Clarence Dawson is a 78 y.o. male with medical history significant of atrial fibrillation, COPD on 4 L of pulmonary oxygen  at home, permanent pacemaker, pulmonary hypertension who comes to the ER with complaints of shortness of breath.  He was found to be in respiratory distress, hypoxic by EMS satting 80% on nonrebreather mask.   Patient reports to be starting to feel short of breath yesterday which worsened overnight.  He also reports of some coughing with thick greenish sputum.  He also reports of feeling weak and having chills.  Denies any chest pain.  Reports palpitation.  Recently hospitalized and treated for pneumonia early October.  Has chronic A-fib on anticoagulation.  He was taken off Tikosyn  after recent hospitalization due to prolonged QTc.  Apparently he is planned for reload coming week.     ED Course: In the ER he was found to be wheezy and in respiratory distress.  He was in A-fib RVR and initiated on diltiazem  drip.  He is also placed on BiPAP with noticeable improvement. Received IV fluid per sepsis protocol, initiated on IV antibiotics in the ER  Laboratory work with sodium 135, potassium 5.9, chloride 94, BUN 33, creatinine 1.70, calcium  8.9, anion gap 22, proBNP 9540, troponin T 59, repeat in 1 hour 53, lactic acid on presentation 8.3, repeat 3.1.  White count 12, hemoglobin 15 platelet 171, INR 2.4, influenza A, influenza B, RSV, COVID-19 negative   Hospitalist service was called to evaluate the patient.  Assessment & Plan:   Principal Problem:   COPD exacerbation (HCC) Active Problems:   Elevated lactic acid level   Persistent atrial fibrillation   Type 2 diabetes mellitus (HCC)   Pacemaker   OSA on CPAP   Acute exacerbation of COPD with asthma (HCC)   Acute on chronic respiratory failure with hypoxia (HCC)   Atrial fibrillation with RVR  (HCC)   Acquired hypothyroidism   Hyperkalemia   78 year old male with multiple comorbidities including atrial fibrillation, chronic respiratory failure on 4 L/min oxygen , COPD, pulmonary hypertension, permanent pacemaker comes in for evaluation of worsening shortness of breath/wheezing, acute on chronic respiratory failure, A-fib RVR,   Acute on chronic hypoxic respiratory failure COPD exacerbation   -Increasing cough, shortness of breath with purulent sputum. -Initially required BiPAP in the ER but quickly transitioned to 5 L/min nasal cannula prior to transfer to stepdown Will start IV Solu-Medrol , albuterol  nebulizer, supportive care IV cefepime and doxycycline  will be continued Blood culture negative so far, will obtain a sputum culture as well.  Lactic acidosis: Likely from hypoxia versus sepsis.  This has improved.   Chronic atrial fibrillation with RVR - A-fib with RVR, started on Cardizem  drip briefly which was discontinued due to hypotension.  Heart rate seems to be better.  He is already initiated on home Cardizem .  Also started on low-dose bisoprolol .  Continue Xarelto  - Tikosyn  was recently discontinued due to AKI and prolonged QT with plan to reinitiate in coming week however will wait until resolution of acute pulm issues. -Cardiology consult, discussed with Dr. Debera, appreciate input-    HFpEF, RV dysfunction History of pulmonary hypertension - On Lasix  60 mg twice daily at home.  Lasix  initiated IV today.  Closely monitor intake and output, electrolytes kidney function  AKI on CKD: Creatinine 1.7 on admission with baseline 1.1-1.2.  Received IV fluid bolus in  the ER per sepsis protocol.  Will be resuming Lasix .  Lower extremity wound - Right lower extremity wound secondary to trauma.  Does not look infected.  Continue local wound care   Hypothyroidism   Status post pacemaker   Resume home medications as appropriate once reconciled   DVT prophylaxis: Chronic  anticoagulation with Xarelto  Code Status: Full code Family Communication: Family at the bedside Disposition Plan: Home Consults called: cardiology Admission status: Patient   Severity of Illness: The appropriate patient status for this patient is INPATIENT. Inpatient status is judged to be reasonable and necessary in order to provide the required intensity of service to ensure the patient's safety. The patient's presenting symptoms, physical exam findings, and initial radiographic and laboratory data in the context of their chronic comorbidities is felt to place them at high risk for further clinical deterioration. Furthermore, it is not anticipated that the patient will be medically stable for discharge from the hospital within 2 midnights of admission.    * I certify that at the point of admission it is my clinical judgment that the patient will require inpatient hospital care spanning beyond 2 midnights from the point of admission due to high intensity of service, high risk for further deterioration and high frequency of surveillance required.*   Subjective:  Patient seen and examined at the bedside.  Feels better than yesterday.  Still having cough with sputum.  No fever or chills.  Blood pressure better today, heart rate has been under control relatively, remains in a flutter.  Objective: Vitals:   10/31/24 0854 10/31/24 1000 10/31/24 1213 10/31/24 1323  BP:  107/68    Pulse:  94    Resp:  14    Temp:   98.4 F (36.9 C)   TempSrc:   Oral   SpO2: 96% 94%  100%  Weight:      Height:        Intake/Output Summary (Last 24 hours) at 10/31/2024 1544 Last data filed at 10/31/2024 1218 Gross per 24 hour  Intake 240 ml  Output 2400 ml  Net -2160 ml   Filed Weights   10/30/24 0828 10/30/24 1000 10/30/24 1157  Weight: 95.6 kg 95.6 kg 93.9 kg    Examination:  General exam: Appears calm and comfortable  Respiratory system: Bilateral decreased breath sounds at bases Cardiovascular  system: S1 & S2 heard, Rate controlled Gastrointestinal system: Abdomen is nondistended, soft and nontender. Normal bowel sounds heard. Extremities: No cyanosis, clubbing, edema  Central nervous system: Alert and oriented. No focal neurological deficits. Moving extremities Skin: No rashes, lesions or ulcers Psychiatry: Judgement and insight appear normal. Mood & affect appropriate.     Data Reviewed: I have personally reviewed following labs and imaging studies  CBC: Recent Labs  Lab 10/30/24 0833 10/31/24 0419  WBC 12.0* 7.0  NEUTROABS 7.3  --   HGB 15.8 13.6  HCT 49.1 40.7  MCV 94.1 92.3  PLT 171 123*   Basic Metabolic Panel: Recent Labs  Lab 10/30/24 0838 10/31/24 0419  NA 135 136  K 5.9* 5.1  CL 94* 102  CO2 19* 22  GLUCOSE 233* 334*  BUN 33* 34*  CREATININE 1.70* 1.36*  CALCIUM  8.9 8.6*   GFR: Estimated Creatinine Clearance: 50.7 mL/min (A) (by C-G formula based on SCr of 1.36 mg/dL (H)). Liver Function Tests: Recent Labs  Lab 10/30/24 0838 10/31/24 0419  AST 66* 60*  ALT 76* 105*  ALKPHOS 78 85  BILITOT 1.5* 0.7  PROT 6.5 5.6*  ALBUMIN 4.0 3.4*   No results for input(s): LIPASE, AMYLASE in the last 168 hours. No results for input(s): AMMONIA in the last 168 hours. Coagulation Profile: Recent Labs  Lab 10/30/24 0838  INR 2.4*   Cardiac Enzymes: No results for input(s): CKTOTAL, CKMB, CKMBINDEX, TROPONINI in the last 168 hours. BNP (last 3 results) Recent Labs    09/30/24 2031 10/04/24 0828 10/30/24 0833  PROBNP 15,139.0* 8,248.0* 9,548.0*   HbA1C: No results for input(s): HGBA1C in the last 72 hours. CBG: Recent Labs  Lab 10/31/24 0942 10/31/24 1118  GLUCAP 448* 449*   Lipid Profile: No results for input(s): CHOL, HDL, LDLCALC, TRIG, CHOLHDL, LDLDIRECT in the last 72 hours. Thyroid  Function Tests: No results for input(s): TSH, T4TOTAL, FREET4, T3FREE, THYROIDAB in the last 72 hours. Anemia  Panel: No results for input(s): VITAMINB12, FOLATE, FERRITIN, TIBC, IRON, RETICCTPCT in the last 72 hours. Sepsis Labs: Recent Labs  Lab 10/30/24 0851 10/30/24 1014  LATICACIDVEN 8.3* 3.1*    Recent Results (from the past 240 hours)  Resp panel by RT-PCR (RSV, Flu A&B, Covid) Anterior Nasal Swab     Status: None   Collection Time: 10/30/24  8:33 AM   Specimen: Anterior Nasal Swab  Result Value Ref Range Status   SARS Coronavirus 2 by RT PCR NEGATIVE NEGATIVE Final    Comment: (NOTE) SARS-CoV-2 target nucleic acids are NOT DETECTED.  The SARS-CoV-2 RNA is generally detectable in upper respiratory specimens during the acute phase of infection. The lowest concentration of SARS-CoV-2 viral copies this assay can detect is 138 copies/mL. A negative result does not preclude SARS-Cov-2 infection and should not be used as the sole basis for treatment or other patient management decisions. A negative result may occur with  improper specimen collection/handling, submission of specimen other than nasopharyngeal swab, presence of viral mutation(s) within the areas targeted by this assay, and inadequate number of viral copies(<138 copies/mL). A negative result must be combined with clinical observations, patient history, and epidemiological information. The expected result is Negative.  Fact Sheet for Patients:  bloggercourse.com  Fact Sheet for Healthcare Providers:  seriousbroker.it  This test is no t yet approved or cleared by the [place]  FDA and  has been authorized for detection and/or diagnosis of SARS-CoV-2 by FDA under an Emergency Use Authorization (EUA). This EUA will remain  in effect (meaning this test can be used) for the duration of the COVID-19 declaration under Section 564(b)(1) of the Act, 21 U.S.C.section 360bbb-3(b)(1), unless the authorization is terminated  or revoked sooner.       Influenza A by  PCR NEGATIVE NEGATIVE Final   Influenza B by PCR NEGATIVE NEGATIVE Final    Comment: (NOTE) The Xpert Xpress SARS-CoV-2/FLU/RSV plus assay is intended as an aid in the diagnosis of influenza from Nasopharyngeal swab specimens and should not be used as a sole basis for treatment. Nasal washings and aspirates are unacceptable for Xpert Xpress SARS-CoV-2/FLU/RSV testing.  Fact Sheet for Patients: bloggercourse.com  Fact Sheet for Healthcare Providers: seriousbroker.it  This test is not yet approved or cleared by the [place]  FDA and has been authorized for detection and/or diagnosis of SARS-CoV-2 by FDA under an Emergency Use Authorization (EUA). This EUA will remain in effect (meaning this test can be used) for the duration of the COVID-19 declaration under Section 564(b)(1) of the Act, 21 U.S.C. section 360bbb-3(b)(1), unless the authorization is terminated or revoked.     Resp Syncytial Virus by PCR NEGATIVE NEGATIVE Final  Comment: (NOTE) Fact Sheet for Patients: bloggercourse.com  Fact Sheet for Healthcare Providers: seriousbroker.it  This test is not yet approved or cleared by the [place]  FDA and has been authorized for detection and/or diagnosis of SARS-CoV-2 by FDA under an Emergency Use Authorization (EUA). This EUA will remain in effect (meaning this test can be used) for the duration of the COVID-19 declaration under Section 564(b)(1) of the Act, 21 U.S.C. section 360bbb-3(b)(1), unless the authorization is terminated or revoked.  Performed at [facility], [address], [place], [place] [address]   Blood Culture (routine x 2)     Status: None (Preliminary result)   Collection Time: 10/30/24  8:51 AM   Specimen: BLOOD RIGHT ARM  Result Value Ref Range Status   Specimen Description   Final    BLOOD RIGHT ARM BOTTLES DRAWN AEROBIC AND ANAEROBIC    Special Requests Blood Culture adequate volume  Final   Culture   Final    NO GROWTH 1 DAY Performed at [facility], [address] [place], [place] [address]    Report Status PENDING  Incomplete  Blood Culture (routine x 2)     Status: None (Preliminary result)   Collection Time: 10/30/24  8:51 AM   Specimen: BLOOD LEFT ARM  Result Value Ref Range Status   Specimen Description BLOOD LEFT ARM BOTTLES DRAWN AEROBIC AND ANAEROBIC  Final   Special Requests Blood Culture adequate volume  Final   Culture   Final    NO GROWTH 1 DAY Performed at [facility], [address], [place], [place] [address]    Report Status PENDING  Incomplete  MRSA Next Gen by PCR, Nasal     Status: None   Collection Time: 10/30/24 11:06 AM   Specimen: Nasal Mucosa; Nasal Swab  Result Value Ref Range Status   MRSA by PCR Next Gen NOT DETECTED NOT DETECTED Final    Comment: (NOTE) The GeneXpert MRSA Assay (FDA approved for NASAL specimens only), is one component of a comprehensive MRSA colonization surveillance program. It is not intended to diagnose MRSA infection nor to guide or monitor treatment for MRSA infections. Test performance is not FDA approved in patients less than 54 years old. Performed at [facility], [address], [place], [place] [address]          Radiology Studies: [facility] Chest [facility] 1 View Result Date: 10/30/2024 EXAM: 1 VIEW XRAY OF THE CHEST 10/30/2024 08:55:38 AM COMPARISON: None available. CLINICAL HISTORY: Shob FINDINGS: LUNGS AND PLEURA: No focal pulmonary opacity. No pulmonary edema. No pleural effusion. No pneumothorax. Emphysema again noted. HEART AND MEDIASTINUM: Left pacemaker in place. No acute abnormality of the cardiac and mediastinal silhouettes. BONES AND SOFT TISSUES: No acute osseous abnormality. IMPRESSION: 1. No acute cardiopulmonary process. 2. Emphysema. Electronically signed by: Norleen Kil MD 10/30/2024 09:18 AM EST RP Workstation: [id number]        Scheduled  Meds:  albuterol   2.5 mg Nebulization TID   atorvastatin   80 mg Oral Daily   bisoprolol   2.5 mg Oral Daily   Brensocatib  25 mg Oral Daily   budesonide -glycopyrrolate -formoterol   2 puff Inhalation BID   Chlorhexidine  Gluconate Cloth  6 each Topical Q0600   collagenase   Topical Daily   diltiazem   120 mg Oral Daily   furosemide   40 mg Intravenous Daily   insulin  aspart  0-9 Units Subcutaneous TID WC   insulin  glargine-yfgn  20 Units Subcutaneous Daily   levothyroxine   150 mcg Oral QAC breakfast  methylPREDNISolone  (SOLU-MEDROL ) injection  40 mg Intravenous BID   pantoprazole   40 mg Oral Daily   rivaroxaban   20 mg Oral Q supper   Continuous Infusions:  ceFEPime (MAXIPIME) IV 2 g (10/31/24 1351)   doxycycline  (VIBRAMYCIN ) IV 100 mg (10/31/24 0946)          Madelyne Millikan, MD [facility] 10/31/2024, 3:44 PM

## 2024-11-01 DIAGNOSIS — I4891 Unspecified atrial fibrillation: Secondary | ICD-10-CM

## 2024-11-01 DIAGNOSIS — J441 Chronic obstructive pulmonary disease with (acute) exacerbation: Secondary | ICD-10-CM | POA: Diagnosis not present

## 2024-11-01 LAB — BASIC METABOLIC PANEL WITH GFR
Anion gap: 9 (ref 5–15)
BUN: 38 mg/dL — ABNORMAL HIGH (ref 8–23)
CO2: 24 mmol/L (ref 22–32)
Calcium: 8.3 mg/dL — ABNORMAL LOW (ref 8.9–10.3)
Chloride: 102 mmol/L (ref 98–111)
Creatinine, Ser: 1.28 mg/dL — ABNORMAL HIGH (ref 0.61–1.24)
GFR, Estimated: 57 mL/min — ABNORMAL LOW (ref >60)
Glucose, Bld: 328 mg/dL — ABNORMAL HIGH (ref 70–99)
Potassium: 4.5 mmol/L (ref 3.5–5.1)
Sodium: 135 mmol/L (ref 135–145)

## 2024-11-01 LAB — GLUCOSE, CAPILLARY
Glucose-Capillary: 315 mg/dL — ABNORMAL HIGH (ref 70–99)
Glucose-Capillary: 469 mg/dL — ABNORMAL HIGH (ref 70–99)
Glucose-Capillary: 429 mg/dL — ABNORMAL HIGH (ref 70–99)
Glucose-Capillary: 391 mg/dL — ABNORMAL HIGH (ref 70–99)

## 2024-11-01 LAB — MAGNESIUM: Magnesium: 2.2 mg/dL (ref 1.7–2.4)

## 2024-11-01 MED ORDER — METHYLPREDNISOLONE SODIUM SUCC 40 MG IJ SOLR
40.0000 mg | Freq: Every day | INTRAMUSCULAR | Status: DC
  Administered 2024-11-01 – 2024-11-03 (×3): 40 mg via INTRAVENOUS
  Filled 2024-11-01 (×2): qty 1

## 2024-11-01 MED ORDER — INSULIN ASPART 100 UNIT/ML IJ SOLN
20.0000 [IU] | Freq: Once | INTRAMUSCULAR | Status: AC
  Administered 2024-11-01: 20 [IU] via SUBCUTANEOUS

## 2024-11-01 MED ORDER — INSULIN GLARGINE-YFGN 100 UNIT/ML ~~LOC~~ SOLN
30.0000 [IU] | Freq: Every day | SUBCUTANEOUS | Status: DC
  Administered 2024-11-01: 30 [IU] via SUBCUTANEOUS
  Filled 2024-11-01 (×3): qty 0.3

## 2024-11-01 MED ORDER — INSULIN ASPART 100 UNIT/ML IJ SOLN
7.0000 [IU] | Freq: Three times a day (TID) | INTRAMUSCULAR | Status: DC
  Administered 2024-11-01 – 2024-11-03 (×7): 7 [IU] via SUBCUTANEOUS
  Filled 2024-11-01 (×7): qty 1

## 2024-11-01 MED ORDER — BISOPROLOL FUMARATE 5 MG PO TABS
2.5000 mg | ORAL_TABLET | Freq: Two times a day (BID) | ORAL | Status: DC
  Administered 2024-11-01 – 2024-11-02 (×2): 2.5 mg via ORAL
  Filled 2024-11-01 (×2): qty 1

## 2024-11-01 NOTE — Inpatient Diabetes Management (Signed)
 Inpatient Diabetes Program Recommendations  AACE/ADA: New Consensus Statement on Inpatient Glycemic Control   Target Ranges:  Prepandial:   less than 140 mg/dL      Peak postprandial:   less than 180 mg/dL (1-2 hours)      Critically ill patients:  140 - 180 mg/dL    Latest Reference Range & Units 10/31/24 09:42 10/31/24 11:18 10/31/24 16:25 10/31/24 20:54 11/01/24 07:53  Glucose-Capillary 70 - 99 mg/dL 551 (H) 550 (H) 596 (H) 351 (H) 315 (H)   Review of Glycemic Control  Diabetes history: DM2 Outpatient Diabetes medications: Glipizide  5 mg QAM, Jardiance  25 mg QAM, Metformin  XR 500 mg BID, Lantus  10 units QAM Current orders for Inpatient glycemic control: Semglee  20 units daily, Novolog  0-15 units TID with meals, Novolog  0-5 units QHS; Solumedrol 40 mg BID   Inpatient Diabetes Program Recommendations:     Insulin : CBGs 334-449 mg/dl on 88/6 and CBG 684 mg/dl this morning.  If steroids are continued as ordered, please consider increasing Semglee  to 30 units daily and adding Novolog  5 units TID with meals for meal coverage if patient eats at least 50% of meals.  Thanks, Earnie Gainer, RN, MSN, CDCES Diabetes Coordinator Inpatient Diabetes Program [phone] (Team Pager from 8am to 5pm)

## 2024-11-01 NOTE — Progress Notes (Signed)
 PROGRESS NOTE    MINOR IDEN  FMW:[id number] DOB: 1946-08-07 DOA: 10/30/2024 PCP: Sheryle Carwin, MD   Brief Narrative:   Clarence Dawson is a 78 y.o. male with medical history significant of atrial fibrillation, COPD on 4 L of pulmonary oxygen  at home, permanent pacemaker, pulmonary hypertension who comes to the ER with complaints of shortness of breath.  He was found to be in respiratory distress, hypoxic by EMS satting 80% on nonrebreather mask.   Patient reports to be starting to feel short of breath yesterday which worsened overnight.  He also reports of some coughing with thick greenish sputum.  He also reports of feeling weak and having chills.  Denies any chest pain.  Reports palpitation.  Recently hospitalized and treated for pneumonia early October.  Has chronic A-fib on anticoagulation.  He was taken off Tikosyn  after recent hospitalization due to prolonged QTc.  Apparently he was planned to reinitiate but is now hospitalized again.   ED Course: In the ER he was found to be wheezy and in respiratory distress.  He was in A-fib RVR and initiated on diltiazem  drip.  He is also placed on BiPAP with noticeable improvement. Received IV fluid per sepsis protocol, initiated on IV antibiotics in the ER  Laboratory work with sodium 135, potassium 5.9, chloride 94, BUN 33, creatinine 1.70, calcium  8.9, anion gap 22, proBNP 9540, troponin T 59, repeat in 1 hour 53, lactic acid on presentation 8.3, repeat 3.1.  White count 12, hemoglobin 15 platelet 171, INR 2.4, influenza A, influenza B, RSV, COVID-19 negative.  Slowly recovering, back to baseline oxygen .  Still having respiratory symptoms with cough and sputum.   Assessment & Plan:   Principal Problem:   COPD exacerbation (HCC) Active Problems:   Elevated lactic acid level   Persistent atrial fibrillation   Type 2 diabetes mellitus (HCC)   Pacemaker   OSA on CPAP   Acute exacerbation of COPD with asthma (HCC)   Acute on chronic  respiratory failure with hypoxia (HCC)   Atrial fibrillation with RVR (HCC)   Acquired hypothyroidism   Hyperkalemia   78 year old male with multiple comorbidities including atrial fibrillation, chronic respiratory failure on 4 L/min oxygen , COPD, pulmonary hypertension, permanent pacemaker comes in for evaluation of worsening shortness of breath/wheezing, acute on chronic respiratory failure, A-fib RVR,   Acute on chronic hypoxic respiratory failure COPD exacerbation   -Increasing cough, shortness of breath with purulent sputum. -Initially required BiPAP in the ER but quickly transitioned to 5 L/min nasal cannula prior to transfer to stepdown -Chest x-ray 11/2 with no acute cardiopulmonary processes.  Influenza A, influenza B, COVID-19, RSV negative -Will decrease IV Solu-Medrol  to 40 mg daily, continue supportive care, albuterol  nebulizer  -Blood culture negative so far, sputum culture is negative for 24 hours. --IV cefepime and doxycycline  will be continued.  - Will ambulate patient.  Lactic acidosis: Likely from hypoxia versus sepsis.  This has improved.   Chronic atrial fibrillation with RVR - A-fib with RVR, started on Cardizem  drip briefly which was discontinued due to hypotension.  Heart rate seems to be better.  He is already initiated on home Cardizem .  Also started on low-dose bisoprolol .  Continue Xarelto  - Tikosyn  was recently discontinued due to AKI and prolonged QT with plan to reinitiate in coming week however will wait until resolution of acute pulm issues. -Cardiology consult, discussed with Dr. Debera, appreciate input-    HFpEF, RV dysfunction History of pulmonary hypertension - On Lasix  60 mg  twice daily at home.  Will continue Lasix  40 mg IV daily..  Closely monitor intake and output, electrolytes kidney function  Diabetes mellitus type 2: Blood sugars are uncontrolled likely exacerbated by steroids. Started on Lantus  as well as sliding scale insulin  coverage.   Will increase Semglee  to 30 units daily and restart mealtime insulin  7 units 3 times daily along with sliding scale coverage.  AKI on CKD: Creatinine 1.7 on admission with baseline 1.1-1.2.  Received IV fluid bolus in the ER per sepsis protocol.  Currently on IV Lasix .  Lower extremity wound - Right lower extremity wound secondary to trauma.  Does not look infected.  Continue local wound care   Hypothyroidism   Status post pacemaker   Resume home medications as appropriate once reconciled   DVT prophylaxis: Chronic anticoagulation with Xarelto  Code Status: Full code Family Communication: Family at the bedside Disposition Plan: Home, anticipate discharge in next 2 days Consults called: cardiology Admission status: Patient   Severity of Illness: The appropriate patient status for this patient is INPATIENT. Inpatient status is judged to be reasonable and necessary in order to provide the required intensity of service to ensure the patient's safety. The patient's presenting symptoms, physical exam findings, and initial radiographic and laboratory data in the context of their chronic comorbidities is felt to place them at high risk for further clinical deterioration. Furthermore, it is not anticipated that the patient will be medically stable for discharge from the hospital within 2 midnights of admission.    * I certify that at the point of admission it is my clinical judgment that the patient will require inpatient hospital care spanning beyond 2 midnights from the point of admission due to high intensity of service, high risk for further deterioration and high frequency of surveillance required.*   Subjective:  Patient seen and examined at the bedside.  Feels better than yesterday.  Still having cough with sputum.  No fever or chills.  Hemodynamically stable.  Heart rates better, patient would like to get out of bed and ambulate.    Objective: Vitals:   10/31/24 2054 10/31/24 2200  11/01/24 0506 11/01/24 0730  BP: 108/73  116/74   Pulse: (!) 103  (!) 59   Resp: 20  20   Temp: 97.9 F (36.6 C)  97.8 F (36.6 C)   TempSrc:      SpO2: 98% 95% 100% 100%  Weight:      Height:        Intake/Output Summary (Last 24 hours) at 11/01/2024 1237 Last data filed at 11/01/2024 1056 Gross per 24 hour  Intake 360 ml  Output 2550 ml  Net -2190 ml   Filed Weights   10/30/24 0828 10/30/24 1000 10/30/24 1157  Weight: 95.6 kg 95.6 kg 93.9 kg    Examination:  General exam: Appears calm and comfortable  Respiratory system: Bilateral decreased breath sounds at bases Cardiovascular system: S1 & S2 heard, Rate controlled Gastrointestinal system: Abdomen is nondistended, soft and nontender. Normal bowel sounds heard. Extremities: No cyanosis, clubbing, edema  Central nervous system: Alert and oriented. No focal neurological deficits. Moving extremities Skin: No rashes, lesions or ulcers Psychiatry: Judgement and insight appear normal. Mood & affect appropriate.     Data Reviewed: I have personally reviewed following labs and imaging studies  CBC: Recent Labs  Lab 10/30/24 0833 10/31/24 0419  WBC 12.0* 7.0  NEUTROABS 7.3  --   HGB 15.8 13.6  HCT 49.1 40.7  MCV 94.1 92.3  PLT  171 123*   Basic Metabolic Panel: Recent Labs  Lab 10/30/24 0838 10/31/24 0419 11/01/24 0433  NA 135 136 135  K 5.9* 5.1 4.5  CL 94* 102 102  CO2 19* 22 24  GLUCOSE 233* 334* 328*  BUN 33* 34* 38*  CREATININE 1.70* 1.36* 1.28*  CALCIUM  8.9 8.6* 8.3*  MG  --   --  2.2   GFR: Estimated Creatinine Clearance: 53.8 mL/min (A) (by C-G formula based on SCr of 1.28 mg/dL (H)). Liver Function Tests: Recent Labs  Lab 10/30/24 0838 10/31/24 0419  AST 66* 60*  ALT 76* 105*  ALKPHOS 78 85  BILITOT 1.5* 0.7  PROT 6.5 5.6*  ALBUMIN 4.0 3.4*   No results for input(s): LIPASE, AMYLASE in the last 168 hours. No results for input(s): AMMONIA in the last 168 hours. Coagulation  Profile: Recent Labs  Lab 10/30/24 0838  INR 2.4*   Cardiac Enzymes: No results for input(s): CKTOTAL, CKMB, CKMBINDEX, TROPONINI in the last 168 hours. BNP (last 3 results) Recent Labs    09/30/24 2031 10/04/24 0828 10/30/24 0833  PROBNP 15,139.0* 8,248.0* 9,548.0*   HbA1C: No results for input(s): HGBA1C in the last 72 hours. CBG: Recent Labs  Lab 10/31/24 1118 10/31/24 1625 10/31/24 2054 11/01/24 0753 11/01/24 1150  GLUCAP 449* 403* 351* 315* 469*   Lipid Profile: No results for input(s): CHOL, HDL, LDLCALC, TRIG, CHOLHDL, LDLDIRECT in the last 72 hours. Thyroid  Function Tests: No results for input(s): TSH, T4TOTAL, FREET4, T3FREE, THYROIDAB in the last 72 hours. Anemia Panel: No results for input(s): VITAMINB12, FOLATE, FERRITIN, TIBC, IRON, RETICCTPCT in the last 72 hours. Sepsis Labs: Recent Labs  Lab 10/30/24 0851 10/30/24 1014  LATICACIDVEN 8.3* 3.1*    Recent Results (from the past 240 hours)  Resp panel by RT-PCR (RSV, Flu A&B, Covid) Anterior Nasal Swab     Status: None   Collection Time: 10/30/24  8:33 AM   Specimen: Anterior Nasal Swab  Result Value Ref Range Status   SARS Coronavirus 2 by RT PCR NEGATIVE NEGATIVE Final    Comment: (NOTE) SARS-CoV-2 target nucleic acids are NOT DETECTED.  The SARS-CoV-2 RNA is generally detectable in upper respiratory specimens during the acute phase of infection. The lowest concentration of SARS-CoV-2 viral copies this assay can detect is 138 copies/mL. A negative result does not preclude SARS-Cov-2 infection and should not be used as the sole basis for treatment or other patient management decisions. A negative result may occur with  improper specimen collection/handling, submission of specimen other than nasopharyngeal swab, presence of viral mutation(s) within the areas targeted by this assay, and inadequate number of viral copies(<138 copies/mL). A negative  result must be combined with clinical observations, patient history, and epidemiological information. The expected result is Negative.  Fact Sheet for Patients:  bloggercourse.com  Fact Sheet for Healthcare Providers:  seriousbroker.it  This test is no t yet approved or cleared by the [place]  FDA and  has been authorized for detection and/or diagnosis of SARS-CoV-2 by FDA under an Emergency Use Authorization (EUA). This EUA will remain  in effect (meaning this test can be used) for the duration of the COVID-19 declaration under Section 564(b)(1) of the Act, 21 U.S.C.section 360bbb-3(b)(1), unless the authorization is terminated  or revoked sooner.       Influenza A by PCR NEGATIVE NEGATIVE Final   Influenza B by PCR NEGATIVE NEGATIVE Final    Comment: (NOTE) The Xpert Xpress SARS-CoV-2/FLU/RSV plus assay is intended as an aid  in the diagnosis of influenza from Nasopharyngeal swab specimens and should not be used as a sole basis for treatment. Nasal washings and aspirates are unacceptable for Xpert Xpress SARS-CoV-2/FLU/RSV testing.  Fact Sheet for Patients: bloggercourse.com  Fact Sheet for Healthcare Providers: seriousbroker.it  This test is not yet approved or cleared by the [place]  FDA and has been authorized for detection and/or diagnosis of SARS-CoV-2 by FDA under an Emergency Use Authorization (EUA). This EUA will remain in effect (meaning this test can be used) for the duration of the COVID-19 declaration under Section 564(b)(1) of the Act, 21 U.S.C. section 360bbb-3(b)(1), unless the authorization is terminated or revoked.     Resp Syncytial Virus by PCR NEGATIVE NEGATIVE Final    Comment: (NOTE) Fact Sheet for Patients: bloggercourse.com  Fact Sheet for Healthcare Providers: seriousbroker.it  This  test is not yet approved or cleared by the [place]  FDA and has been authorized for detection and/or diagnosis of SARS-CoV-2 by FDA under an Emergency Use Authorization (EUA). This EUA will remain in effect (meaning this test can be used) for the duration of the COVID-19 declaration under Section 564(b)(1) of the Act, 21 U.S.C. section 360bbb-3(b)(1), unless the authorization is terminated or revoked.  Performed at [facility], [address], [place], [place] [address]   Blood Culture (routine x 2)     Status: None (Preliminary result)   Collection Time: 10/30/24  8:51 AM   Specimen: BLOOD RIGHT ARM  Result Value Ref Range Status   Specimen Description   Final    BLOOD RIGHT ARM BOTTLES DRAWN AEROBIC AND ANAEROBIC   Special Requests Blood Culture adequate volume  Final   Culture   Final    NO GROWTH 2 DAYS Performed at [facility], [address] [place], [place] [address]    Report Status PENDING  Incomplete  Blood Culture (routine x 2)     Status: None (Preliminary result)   Collection Time: 10/30/24  8:51 AM   Specimen: BLOOD LEFT ARM  Result Value Ref Range Status   Specimen Description BLOOD LEFT ARM BOTTLES DRAWN AEROBIC AND ANAEROBIC  Final   Special Requests Blood Culture adequate volume  Final   Culture   Final    NO GROWTH 2 DAYS Performed at [facility], [address], [place], [place] [address]    Report Status PENDING  Incomplete  MRSA Next Gen by PCR, Nasal     Status: None   Collection Time: 10/30/24 11:06 AM   Specimen: Nasal Mucosa; Nasal Swab  Result Value Ref Range Status   MRSA by PCR Next Gen NOT DETECTED NOT DETECTED Final    Comment: (NOTE) The GeneXpert MRSA Assay (FDA approved for NASAL specimens only), is one component of a comprehensive MRSA colonization surveillance program. It is not intended to diagnose MRSA infection nor to guide or monitor treatment for MRSA infections. Test performance is not FDA approved in patients less than 45  years old. Performed at [facility], [address] [place], [place] [address]   Expectorated Sputum Assessment w Gram Stain, Rflx to Resp Cult     Status: None   Collection Time: 10/31/24  1:53 PM   Specimen: Expectorated Sputum  Result Value Ref Range Status   Specimen Description EXPECTORATED SPUTUM  Final   Special Requests NONE  Final   Sputum evaluation   Final    THIS SPECIMEN IS ACCEPTABLE FOR SPUTUM CULTURE Performed at [facility], [address] [place], [place] [address]  Report Status 10/31/2024 FINAL  Final         Radiology Studies: No results found.       Scheduled Meds:  albuterol   2.5 mg Nebulization TID   atorvastatin   80 mg Oral Daily   bisoprolol   2.5 mg Oral BID   Brensocatib  25 mg Oral Daily   budesonide -glycopyrrolate -formoterol   2 puff Inhalation BID   Chlorhexidine  Gluconate Cloth  6 each Topical Q0600   collagenase   Topical Daily   diltiazem   120 mg Oral Daily   furosemide   40 mg Intravenous Daily   insulin  aspart  0-15 Units Subcutaneous TID WC   insulin  aspart  0-5 Units Subcutaneous QHS   insulin  aspart  7 Units Subcutaneous TID WC   insulin  glargine-yfgn  30 Units Subcutaneous Daily   levothyroxine   150 mcg Oral QAC breakfast   methylPREDNISolone  (SOLU-MEDROL ) injection  40 mg Intravenous Daily   pantoprazole   40 mg Oral Daily   rivaroxaban   20 mg Oral Q supper   Continuous Infusions:  ceFEPime (MAXIPIME) IV 2 g (11/01/24 0215)   doxycycline  (VIBRAMYCIN ) IV 100 mg (11/01/24 1002)          Cieanna Stormes, MD [facility] 11/01/2024, 12:37 PM

## 2024-11-01 NOTE — Progress Notes (Signed)
 Patient had Brinsupri medication brought in from home. He had 21 tablets in the bottle. Took to pharmacy for pharmacy to dispense.

## 2024-11-01 NOTE — Plan of Care (Signed)
   Problem: Activity: Goal: Risk for activity intolerance will decrease Outcome: Progressing   Problem: Safety: Goal: Ability to remain free from injury will improve Outcome: Progressing   Problem: Skin Integrity: Goal: Risk for impaired skin integrity will decrease Outcome: Progressing

## 2024-11-01 NOTE — Progress Notes (Signed)
 Progress Note  Patient Name: Clarence Dawson Date of Encounter: 11/01/2024  Primary Cardiologist: Danelle Birmingham, MD  Interval Summary  Chart reviewed.  Patient transferred out of unit to telemetry.  Breathing less labored with current treatment.  No palpitations or chest pain.  Net urine output approximately 2100 cc last 24 hours.  Vital Signs  Vitals:   10/31/24 2054 10/31/24 2200 11/01/24 0506 11/01/24 0730  BP: 108/73  116/74   Pulse: (!) 103  (!) 59   Resp: 20  20   Temp: 97.9 F (36.6 C)  97.8 F (36.6 C)   TempSrc:      SpO2: 98% 95% 100% 100%  Weight:      Height:        Intake/Output Summary (Last 24 hours) at 11/01/2024 0905 Last data filed at 11/01/2024 0537 Gross per 24 hour  Intake 600 ml  Output 2400 ml  Net -1800 ml   Filed Weights   10/30/24 0828 10/30/24 1000 10/30/24 1157  Weight: 95.6 kg 95.6 kg 93.9 kg    Physical Exam  GEN: No acute distress.   Neck: No JVD. Cardiac: Distant, irregular irregular, soft systolic murmur.  Respiratory: Prolonged expiratory phase with mild wheeze. GI: Soft, nontender, bowel sounds present. MS: SCDs in place.  ECG/Telemetry  Telemetry reviewed showing atrial fibrillation with intermittent ventricular pacing.  Labs  Chemistry Recent Labs  Lab 10/30/24 0838 10/31/24 0419 11/01/24 0433  NA 135 136 135  K 5.9* 5.1 4.5  CL 94* 102 102  CO2 19* 22 24  GLUCOSE 233* 334* 328*  BUN 33* 34* 38*  CREATININE 1.70* 1.36* 1.28*  CALCIUM  8.9 8.6* 8.3*  PROT 6.5 5.6*  --   ALBUMIN 4.0 3.4*  --   AST 66* 60*  --   ALT 76* 105*  --   ALKPHOS 78 85  --   BILITOT 1.5* 0.7  --   GFRNONAA 41* 53* 57*  ANIONGAP 22* 12 9    Hematology Recent Labs  Lab 10/30/24 0833 10/31/24 0419  WBC 12.0* 7.0  RBC 5.22 4.41  HGB 15.8 13.6  HCT 49.1 40.7  MCV 94.1 92.3  MCH 30.3 30.8  MCHC 32.2 33.4  RDW 15.2 15.1  PLT 171 123*   Lipid Panel     Component Value Date/Time   CHOL 142 06/20/2024 0822   TRIG 155 (H)  06/20/2024 0822   HDL 36 (L) 06/20/2024 0822   CHOLHDL 3.9 06/20/2024 0822   VLDL 31 06/20/2024 0822   LDLCALC 75 06/20/2024 0822    Cardiac Studies  Echocardiogram 10/01/2024:  1. No LV thrombus by Definity. Left ventricular ejection fraction, by  estimation, is 55 to 60%. The left ventricle has normal function. Left  ventricular endocardial border not optimally defined to evaluate regional  wall motion. Left ventricular  diastolic parameters are indeterminate.   2. Right ventricular systolic function is moderately reduced. The right  ventricular size is severely enlarged. There is severely elevated  pulmonary artery systolic pressure. The estimated right ventricular  systolic pressure is 60.7 mmHg.   3. The mitral valve is normal in structure. No evidence of mitral valve  regurgitation. No evidence of mitral stenosis.   4. The aortic valve was not well visualized. Aortic valve regurgitation  is not visualized. Mild to moderate aortic valve stenosis. Aortic valve  area, by VTI measures 1.03 cm. Aortic valve mean gradient measures 14.0  mmHg. Aortic valve Vmax measures  2.45 m/s.   5. The inferior  vena cava is dilated in size with >50% respiratory  variability, suggesting right atrial pressure of 8 mmHg.   Assessment & Plan  1.  Persistent atrial fibrillation presenting with RVR, likely atypical atrial flutter as well.  CHA2DS2-VASc score is 6.  Heart rate control better at this time with continued medication adjustments and treatment for COPD exacerbation..   2.  HFpEF with RV dysfunction.  Echocardiogram in early October shows LVEF 55 to 60%, moderate RV dysfunction with severe pulmonary hypertension, RVSP estimated 61 mmHg.  Currently on Lasix  40 mg IV daily with net urine output approximately 2100 cc last 24 hours.  Renal function improving.   3.  Mild to moderate aortic stenosis, mean AV gradient 14 mmHg.   4.  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   5.   Nonobstructive CAD by cardiac catheterization in 2024.   6.  CKD stage IIIa-b, .  Creatinine down to 1.28 with GFR 57.  Continue Cardizem  CD 120 mg daily.  Increase bisoprolol  to 2.5 mg twice daily and titrate further as needed (was dividing dose at home as well).  Continue Xarelto  20 mg daily for stroke prophylaxis.  No change in current IV diuretics, follow urine output and renal function.  As discussed in consultation note, holding off on resumption of Tikosyn  until a later date.  For questions or updates, please contact [facility] Please consult [email or web address] for contact info under   Signed, Jayson Sierras, MD  11/01/2024, 9:05 AM

## 2024-11-02 DIAGNOSIS — Z794 Long term (current) use of insulin: Secondary | ICD-10-CM

## 2024-11-02 DIAGNOSIS — I4891 Unspecified atrial fibrillation: Secondary | ICD-10-CM | POA: Diagnosis not present

## 2024-11-02 DIAGNOSIS — J441 Chronic obstructive pulmonary disease with (acute) exacerbation: Secondary | ICD-10-CM | POA: Diagnosis not present

## 2024-11-02 DIAGNOSIS — E039 Hypothyroidism, unspecified: Secondary | ICD-10-CM

## 2024-11-02 DIAGNOSIS — I4819 Other persistent atrial fibrillation: Secondary | ICD-10-CM | POA: Diagnosis not present

## 2024-11-02 DIAGNOSIS — E1121 Type 2 diabetes mellitus with diabetic nephropathy: Secondary | ICD-10-CM

## 2024-11-02 LAB — GLUCOSE, CAPILLARY
Glucose-Capillary: 267 mg/dL — ABNORMAL HIGH (ref 70–99)
Glucose-Capillary: 250 mg/dL — ABNORMAL HIGH (ref 70–99)
Glucose-Capillary: 260 mg/dL — ABNORMAL HIGH (ref 70–99)
Glucose-Capillary: 258 mg/dL — ABNORMAL HIGH (ref 70–99)

## 2024-11-02 LAB — BASIC METABOLIC PANEL WITH GFR
Anion gap: 10 (ref 5–15)
BUN: 38 mg/dL — ABNORMAL HIGH (ref 8–23)
CO2: 23 mmol/L (ref 22–32)
Calcium: 8.6 mg/dL — ABNORMAL LOW (ref 8.9–10.3)
Chloride: 104 mmol/L (ref 98–111)
Creatinine, Ser: 1.35 mg/dL — ABNORMAL HIGH (ref 0.61–1.24)
GFR, Estimated: 54 mL/min — ABNORMAL LOW (ref >60)
Glucose, Bld: 260 mg/dL — ABNORMAL HIGH (ref 70–99)
Potassium: 5.2 mmol/L — ABNORMAL HIGH (ref 3.5–5.1)
Sodium: 137 mmol/L (ref 135–145)

## 2024-11-02 MED ORDER — BISOPROLOL FUMARATE 5 MG PO TABS
5.0000 mg | ORAL_TABLET | Freq: Two times a day (BID) | ORAL | Status: DC
  Administered 2024-11-02 – 2024-11-03 (×2): 5 mg via ORAL
  Filled 2024-11-02 (×2): qty 1

## 2024-11-02 MED ORDER — INSULIN GLARGINE-YFGN 100 UNIT/ML ~~LOC~~ SOLN
35.0000 [IU] | Freq: Every day | SUBCUTANEOUS | Status: DC
  Administered 2024-11-02 – 2024-11-03 (×2): 35 [IU] via SUBCUTANEOUS
  Filled 2024-11-02 (×3): qty 0.35

## 2024-11-02 MED ORDER — INSULIN ASPART 100 UNIT/ML IJ SOLN
0.0000 [IU] | Freq: Every day | INTRAMUSCULAR | Status: DC
  Administered 2024-11-02: 3 [IU] via SUBCUTANEOUS
  Filled 2024-11-02: qty 1

## 2024-11-02 MED ORDER — CEPHALEXIN 500 MG PO CAPS
500.0000 mg | ORAL_CAPSULE | Freq: Two times a day (BID) | ORAL | Status: DC
  Administered 2024-11-02 – 2024-11-03 (×3): 500 mg via ORAL
  Filled 2024-11-02 (×3): qty 1

## 2024-11-02 MED ORDER — FUROSEMIDE 40 MG PO TABS
40.0000 mg | ORAL_TABLET | Freq: Two times a day (BID) | ORAL | Status: DC
  Administered 2024-11-03 (×2): 40 mg via ORAL
  Filled 2024-11-02 (×2): qty 1

## 2024-11-02 MED ORDER — DOXYCYCLINE HYCLATE 100 MG PO TABS
100.0000 mg | ORAL_TABLET | Freq: Two times a day (BID) | ORAL | Status: DC
  Administered 2024-11-02 – 2024-11-03 (×3): 100 mg via ORAL
  Filled 2024-11-02 (×3): qty 1

## 2024-11-02 MED ORDER — INSULIN ASPART 100 UNIT/ML IJ SOLN
0.0000 [IU] | Freq: Three times a day (TID) | INTRAMUSCULAR | Status: DC
  Administered 2024-11-02: 5 [IU] via SUBCUTANEOUS
  Administered 2024-11-02 – 2024-11-03 (×2): 8 [IU] via SUBCUTANEOUS
  Administered 2024-11-03: 15 [IU] via SUBCUTANEOUS
  Administered 2024-11-03: 3 [IU] via SUBCUTANEOUS
  Filled 2024-11-02 (×5): qty 1

## 2024-11-02 NOTE — Plan of Care (Signed)

## 2024-11-02 NOTE — Progress Notes (Signed)
 PROGRESS NOTE    TALTON DELPRIORE  FMW:[id number] DOB: October 03, 1946 DOA: 10/30/2024 PCP: Sheryle Carwin, MD   Brief Narrative:   Clarence Dawson is a 78 y.o. male with medical history significant of atrial fibrillation, COPD on 4 L of pulmonary oxygen  at home, permanent pacemaker, pulmonary hypertension who comes to the ER with complaints of shortness of breath.  He was found to be in respiratory distress, hypoxic by EMS satting 80% on nonrebreather mask.   Patient reports to be starting to feel short of breath yesterday which worsened overnight.  He also reports of some coughing with thick greenish sputum.  He also reports of feeling weak and having chills.  Denies any chest pain.  Reports palpitation.  Recently hospitalized and treated for pneumonia early October.  Has chronic A-fib on anticoagulation.  He was taken off Tikosyn  after recent hospitalization due to prolonged QTc.  Apparently he was planned to reinitiate but is now hospitalized again.   ED Course: In the ER he was found to be wheezy and in respiratory distress.  He was in A-fib RVR and initiated on diltiazem  drip.  He is also placed on BiPAP with noticeable improvement. Received IV fluid per sepsis protocol, initiated on IV antibiotics in the ER  Laboratory work with sodium 135, potassium 5.9, chloride 94, BUN 33, creatinine 1.70, calcium  8.9, anion gap 22, proBNP 9540, troponin T 59, repeat in 1 hour 53, lactic acid on presentation 8.3, repeat 3.1.  White count 12, hemoglobin 15 platelet 171, INR 2.4, influenza A, influenza B, RSV, COVID-19 negative.  Slowly recovering, back to baseline oxygen .  Still having respiratory symptoms with cough and sputum.   Assessment & Plan:   Principal Problem:   COPD exacerbation (HCC) Active Problems:   Elevated lactic acid level   Persistent atrial fibrillation   Type 2 diabetes mellitus (HCC)   Pacemaker   OSA on CPAP   Acute exacerbation of COPD with asthma (HCC)   Acute on chronic  respiratory failure with hypoxia (HCC)   Atrial fibrillation with RVR (HCC)   Acquired hypothyroidism   Hyperkalemia   78 year old male with multiple comorbidities including atrial fibrillation, chronic respiratory failure on 4 L/min oxygen , COPD, pulmonary hypertension, permanent pacemaker comes in for evaluation of worsening shortness of breath/wheezing, acute on chronic respiratory failure, A-fib RVR,   Acute on chronic hypoxic respiratory failure COPD exacerbation   -Increasing cough, shortness of breath with purulent sputum. -Initially required BiPAP in the ER but quickly transitioned to 5 L/min nasal cannula prior to transfer to stepdown -Chest x-ray 11/2 with no acute cardiopulmonary processes.  Influenza A, influenza B, COVID-19, RSV negative -Blood culture negative so far, sputum culture is negative for 24 hours. -- Antibiotic has been transition to oral route - Continue bronchodilator management and steroids at current dose. - Physical activity has been discussed with patient. - Continue mucolytics and the use of incentive spirometer.  Lactic acidosis: Likely from hypoxia versus sepsis.  This has improved.   Chronic atrial fibrillation with RVR - A-fib with RVR, started on Cardizem  drip briefly which was discontinued due to hypotension.  Heart rate seems to be better.  He is already initiated on home Cardizem .  Also started on low-dose bisoprolol .  Continue Xarelto  - Tikosyn  was recently discontinued due to AKI and prolonged QT with plan to reinitiate in coming week however will wait until resolution of acute pulm issues. -Cardiology consultation appreciated - Continue adjusted dose of bisoprolol  and Cardizem .   HFpEF, RV  dysfunction History of pulmonary hypertension - On Lasix  60 mg twice daily at home.   - Per discussion with cardiology planning to increase Lasix  to every 12 hours starting on 11/03/2024 - Follow daily weights and strict intake and output. - Low-sodium diet  discussed with patient.  - Continue close monitoring of patient renal function and electrolytes.  Diabetes mellitus type 2: Blood sugars are uncontrolled likely exacerbated by steroids. Started on Lantus  as well as sliding scale insulin  coverage.  Will increase Semglee  to 30 units daily and restart mealtime insulin  7 units 3 times daily along with sliding scale coverage. - Continue to follow CBG fluctuation and further adjustment to hypoglycemia regimen as needed.  AKI on CKD: Creatinine 1.7 on admission with baseline 1.1-1.2.  Received IV fluid bolus in the ER per sepsis protocol.   -Currently on IV Lasix . - Continue to follow cardiology service recommendation for Lasix  dose.  Lower extremity wound - Right lower extremity wound secondary to trauma.  Does not look infected.   -Continue local wound care   Hypothyroidism -Continue Synthroid .  Status post pacemaker   Resume home medications as appropriate once reconciled   DVT prophylaxis: Chronic anticoagulation with Xarelto  Code Status: Full code Family Communication: Family at the bedside Disposition Plan: Home, anticipate discharge in next 2 days Consults called: cardiology Admission status: Patient   Severity of Illness: The appropriate patient status for this patient is INPATIENT. Inpatient status is judged to be reasonable and necessary in order to provide the required intensity of service to ensure the patient's safety. The patient's presenting symptoms, physical exam findings, and initial radiographic and laboratory data in the context of their chronic comorbidities is felt to place them at high risk for further clinical deterioration. Furthermore, it is not anticipated that the patient will be medically stable for discharge from the hospital within 2 midnights of admission.    * I certify that at the point of admission it is my clinical judgment that the patient will require inpatient hospital care spanning beyond 2 midnights  from the point of admission due to high intensity of service, high risk for further deterioration and high frequency of surveillance required.*   Subjective: No major distress; overall expressing some improvement in his breathing.  Denies chest pain, no nausea, no vomiting.  Still experiencing heart racing/fluttering sensation with exertion.  Short winded and mild orthopnea reported.   Objective: Vitals:   11/02/24 0557 11/02/24 0725 11/02/24 1300 11/02/24 1351  BP: 127/81  121/78   Pulse: 70  70   Resp:      Temp:   98.5 F (36.9 C)   TempSrc: Oral  Oral   SpO2: 100% 100% 100% 97%  Weight:      Height:        Intake/Output Summary (Last 24 hours) at 11/02/2024 1909 Last data filed at 11/02/2024 1841 Gross per 24 hour  Intake 240 ml  Output 450 ml  Net -210 ml   Filed Weights   10/30/24 0828 10/30/24 1000 10/30/24 1157  Weight: 95.6 kg 95.6 kg 93.9 kg    Examination: General exam: Alert, awake, oriented x 3; still reporting short winded sensation with activity, mild orthopnea description and per telemetry evaluation ongoing arrhythmias especially with exertion. Respiratory system: Saturation on chronic supplementation; positive fine crackles at the bases.  No significant wheezing.  No using accessory muscle. Cardiovascular system: Irregular, no rubs, no gallops, no JVD. Gastrointestinal system: Abdomen is nondistended, soft and nontender. No organomegaly or masses felt.  Normal bowel sounds heard. Central nervous system: No focal neurological deficits. Extremities: No cyanosis or clubbing; trace edema appreciated bilaterally. Skin: No petechiae. Psychiatry: Judgement and insight appear normal. Mood & affect appropriate.   Data Reviewed: I have personally reviewed following labs and imaging studies  CBC: Recent Labs  Lab 10/30/24 0833 10/31/24 0419  WBC 12.0* 7.0  NEUTROABS 7.3  --   HGB 15.8 13.6  HCT 49.1 40.7  MCV 94.1 92.3  PLT 171 123*   Basic Metabolic  Panel: Recent Labs  Lab 10/30/24 0838 10/31/24 0419 11/01/24 0433 11/02/24 0501  NA 135 136 135 137  K 5.9* 5.1 4.5 5.2*  CL 94* 102 102 104  CO2 19* 22 24 23   GLUCOSE 233* 334* 328* 260*  BUN 33* 34* 38* 38*  CREATININE 1.70* 1.36* 1.28* 1.35*  CALCIUM  8.9 8.6* 8.3* 8.6*  MG  --   --  2.2  --    GFR: Estimated Creatinine Clearance: 51 mL/min (A) (by C-G formula based on SCr of 1.35 mg/dL (H)).  Liver Function Tests: Recent Labs  Lab 10/30/24 0838 10/31/24 0419  AST 66* 60*  ALT 76* 105*  ALKPHOS 78 85  BILITOT 1.5* 0.7  PROT 6.5 5.6*  ALBUMIN 4.0 3.4*   Coagulation Profile: Recent Labs  Lab 10/30/24 0838  INR 2.4*   BNP (last 3 results) Recent Labs    09/30/24 2031 10/04/24 0828 10/30/24 0833  PROBNP 15,139.0* 8,248.0* 9,548.0*   CBG: Recent Labs  Lab 11/01/24 1557 11/01/24 2010 11/02/24 0753 11/02/24 1123 11/02/24 1601  GLUCAP 429* 391* 267* 250* 260*   Sepsis Labs: Recent Labs  Lab 10/30/24 0851 10/30/24 1014  LATICACIDVEN 8.3* 3.1*    Recent Results (from the past 240 hours)  Resp panel by RT-PCR (RSV, Flu A&B, Covid) Anterior Nasal Swab     Status: None   Collection Time: 10/30/24  8:33 AM   Specimen: Anterior Nasal Swab  Result Value Ref Range Status   SARS Coronavirus 2 by RT PCR NEGATIVE NEGATIVE Final    Comment: (NOTE) SARS-CoV-2 target nucleic acids are NOT DETECTED.  The SARS-CoV-2 RNA is generally detectable in upper respiratory specimens during the acute phase of infection. The lowest concentration of SARS-CoV-2 viral copies this assay can detect is 138 copies/mL. A negative result does not preclude SARS-Cov-2 infection and should not be used as the sole basis for treatment or other patient management decisions. A negative result may occur with  improper specimen collection/handling, submission of specimen other than nasopharyngeal swab, presence of viral mutation(s) within the areas targeted by this assay, and inadequate  number of viral copies(<138 copies/mL). A negative result must be combined with clinical observations, patient history, and epidemiological information. The expected result is Negative.  Fact Sheet for Patients:  bloggercourse.com  Fact Sheet for Healthcare Providers:  seriousbroker.it  This test is no t yet approved or cleared by the [place]  FDA and  has been authorized for detection and/or diagnosis of SARS-CoV-2 by FDA under an Emergency Use Authorization (EUA). This EUA will remain  in effect (meaning this test can be used) for the duration of the COVID-19 declaration under Section 564(b)(1) of the Act, 21 U.S.C.section 360bbb-3(b)(1), unless the authorization is terminated  or revoked sooner.       Influenza A by PCR NEGATIVE NEGATIVE Final   Influenza B by PCR NEGATIVE NEGATIVE Final    Comment: (NOTE) The Xpert Xpress SARS-CoV-2/FLU/RSV plus assay is intended as an aid in the diagnosis of  influenza from Nasopharyngeal swab specimens and should not be used as a sole basis for treatment. Nasal washings and aspirates are unacceptable for Xpert Xpress SARS-CoV-2/FLU/RSV testing.  Fact Sheet for Patients: bloggercourse.com  Fact Sheet for Healthcare Providers: seriousbroker.it  This test is not yet approved or cleared by the [place]  FDA and has been authorized for detection and/or diagnosis of SARS-CoV-2 by FDA under an Emergency Use Authorization (EUA). This EUA will remain in effect (meaning this test can be used) for the duration of the COVID-19 declaration under Section 564(b)(1) of the Act, 21 U.S.C. section 360bbb-3(b)(1), unless the authorization is terminated or revoked.     Resp Syncytial Virus by PCR NEGATIVE NEGATIVE Final    Comment: (NOTE) Fact Sheet for Patients: bloggercourse.com  Fact Sheet for Healthcare  Providers: seriousbroker.it  This test is not yet approved or cleared by the [place]  FDA and has been authorized for detection and/or diagnosis of SARS-CoV-2 by FDA under an Emergency Use Authorization (EUA). This EUA will remain in effect (meaning this test can be used) for the duration of the COVID-19 declaration under Section 564(b)(1) of the Act, 21 U.S.C. section 360bbb-3(b)(1), unless the authorization is terminated or revoked.  Performed at [facility] [address], [place], [place] [address]   Blood Culture (routine x 2)     Status: None (Preliminary result)   Collection Time: 10/30/24  8:51 AM   Specimen: BLOOD RIGHT ARM  Result Value Ref Range Status   Specimen Description   Final    BLOOD RIGHT ARM BOTTLES DRAWN AEROBIC AND ANAEROBIC Performed at [facility], [address] [place], [place] [address]    Special Requests   Final    Blood Culture adequate volume Performed at [facility], [address] [place], [place] [address]    Culture   Final    NO GROWTH 3 DAYS Performed at [facility], [address], [place], [place] [address]    Report Status PENDING  Incomplete  Blood Culture (routine x 2)     Status: None (Preliminary result)   Collection Time: 10/30/24  8:51 AM   Specimen: BLOOD LEFT ARM  Result Value Ref Range Status   Specimen Description   Final    BLOOD LEFT ARM BOTTLES DRAWN AEROBIC AND ANAEROBIC Performed at [facility], [address] [place], [place] [address]    Special Requests   Final    Blood Culture adequate volume Performed at [facility], [address] [place], [place] [address]    Culture   Final    NO GROWTH 3 DAYS Performed at [facility], [address], [place], [place] [address]    Report Status PENDING  Incomplete  MRSA Next Gen by PCR, Nasal     Status: None   Collection Time: 10/30/24 11:06 AM   Specimen: Nasal Mucosa; Nasal Swab  Result Value Ref Range Status   MRSA by  PCR Next Gen NOT DETECTED NOT DETECTED Final    Comment: (NOTE) The GeneXpert MRSA Assay (FDA approved for NASAL specimens only), is one component of a comprehensive MRSA colonization surveillance program. It is not intended to diagnose MRSA infection nor to guide or monitor treatment for MRSA infections. Test performance is not FDA approved in patients less than 19 years old. Performed at [facility], [address] [place], [place] [address]   Expectorated Sputum Assessment w Gram Stain, Rflx to Resp Cult     Status: None   Collection  Time: 10/31/24  1:53 PM   Specimen: Expectorated Sputum  Result Value Ref Range Status   Specimen Description EXPECTORATED SPUTUM  Final   Special Requests NONE  Final   Sputum evaluation   Final    THIS SPECIMEN IS ACCEPTABLE FOR SPUTUM CULTURE Performed at [facility], [address] [place], [place] [address]    Report Status 10/31/2024 FINAL  Final  Culture, Respiratory w Gram Stain     Status: None (Preliminary result)   Collection Time: 10/31/24  1:53 PM  Result Value Ref Range Status   Specimen Description   Final    EXPECTORATED SPUTUM Performed at [facility], [address], [place], [place] [address]    Special Requests   Final    NONE Reflexed from [id number] Performed at [facility], [address] [place], [place] [address]    Gram Stain   Final    FEW WBC PRESENT, PREDOMINANTLY PMN MODERATE GRAM NEGATIVE RODS FEW GRAM POSITIVE COCCI    Culture   Final    CULTURE REINCUBATED FOR BETTER GROWTH Performed at [facility], [address] [place], [place] [address]    Report Status PENDING  Incomplete    Scheduled Meds:  albuterol   2.5 mg Nebulization TID   atorvastatin   80 mg Oral Daily   bisoprolol   5 mg Oral BID   Brensocatib  25 mg Oral Daily   budesonide -glycopyrrolate -formoterol   2 puff Inhalation BID   cephALEXin   500 mg Oral Q12H   Chlorhexidine  Gluconate Cloth  6 each Topical Q0600   collagenase   Topical Daily    diltiazem   120 mg Oral Daily   doxycycline   100 mg Oral Q12H   [START ON 11/03/2024] furosemide   40 mg Oral BID   insulin  aspart  0-15 Units Subcutaneous TID WC   insulin  aspart  0-5 Units Subcutaneous QHS   insulin  aspart  7 Units Subcutaneous TID WC   insulin  glargine-yfgn  35 Units Subcutaneous Daily   levothyroxine   150 mcg Oral QAC breakfast   methylPREDNISolone  (SOLU-MEDROL ) injection  40 mg Intravenous Daily   pantoprazole   40 mg Oral Daily   rivaroxaban   20 mg Oral Q supper   Continuous Infusions:   Eric Nunnery, MD [facility] 11/02/2024, 7:09 PM

## 2024-11-02 NOTE — Progress Notes (Signed)
 Progress Note  Patient Name: Clarence Dawson Date of Encounter: 11/02/2024  Primary Cardiologist: Danelle Birmingham, MD  Interval Summary  Chart reviewed.  Reports baseline breathing better this morning at rest, although he did get more short of breath after getting up and going to the bathroom and then getting situated in the bed.  RVR noted during that time on telemetry.  Vital Signs  Vitals:   11/01/24 2015 11/01/24 2229 11/02/24 0557 11/02/24 0725  BP:  114/67 127/81   Pulse:  94 70   Resp:      Temp:  98.1 F (36.7 C)    TempSrc:  Oral Oral   SpO2: 94% 92% 100% 100%  Weight:      Height:        Intake/Output Summary (Last 24 hours) at 11/02/2024 1108 Last data filed at 11/02/2024 0602 Gross per 24 hour  Intake 240 ml  Output 750 ml  Net -510 ml   Filed Weights   10/30/24 0828 10/30/24 1000 10/30/24 1157  Weight: 95.6 kg 95.6 kg 93.9 kg    Physical Exam  GEN: No acute distress.   Neck: No JVD. Cardiac: Distant, irregular irregular, soft systolic murmur.  Respiratory: Prolonged expiratory phase with mild wheeze. GI: Soft, nontender, bowel sounds present. MS: SCDs in place.  ECG/Telemetry  Telemetry reviewed showing atrial fibrillation with intermittent ventricular pacing.  Labs  Chemistry Recent Labs  Lab 10/30/24 [phone] 10/31/24 0419 11/01/24 0433 11/02/24 0501  NA 135 136 135 137  K 5.9* 5.1 4.5 5.2*  CL 94* 102 102 104  CO2 19* 22 24 23   GLUCOSE 233* 334* 328* 260*  BUN 33* 34* 38* 38*  CREATININE 1.70* 1.36* 1.28* 1.35*  CALCIUM  8.9 8.6* 8.3* 8.6*  PROT 6.5 5.6*  --   --   ALBUMIN 4.0 3.4*  --   --   AST 66* 60*  --   --   ALT 76* 105*  --   --   ALKPHOS 78 85  --   --   BILITOT 1.5* 0.7  --   --   GFRNONAA 41* 53* 57* 54*  ANIONGAP 22* 12 9 10     Hematology Recent Labs  Lab 10/30/24 0833 10/31/24 0419  WBC 12.0* 7.0  RBC 5.22 4.41  HGB 15.8 13.6  HCT 49.1 40.7  MCV 94.1 92.3  MCH 30.3 30.8  MCHC 32.2 33.4  RDW 15.2 15.1  PLT  171 123*   Lipid Panel     Component Value Date/Time   CHOL 142 06/20/2024 0822   TRIG 155 (H) 06/20/2024 0822   HDL 36 (L) 06/20/2024 0822   CHOLHDL 3.9 06/20/2024 0822   VLDL 31 06/20/2024 0822   LDLCALC 75 06/20/2024 0822    Cardiac Studies  Echocardiogram 10/01/2024:  1. No LV thrombus by Definity. Left ventricular ejection fraction, by  estimation, is 55 to 60%. The left ventricle has normal function. Left  ventricular endocardial border not optimally defined to evaluate regional  wall motion. Left ventricular  diastolic parameters are indeterminate.   2. Right ventricular systolic function is moderately reduced. The right  ventricular size is severely enlarged. There is severely elevated  pulmonary artery systolic pressure. The estimated right ventricular  systolic pressure is 60.7 mmHg.   3. The mitral valve is normal in structure. No evidence of mitral valve  regurgitation. No evidence of mitral stenosis.   4. The aortic valve was not well visualized. Aortic valve regurgitation  is not visualized. Mild  to moderate aortic valve stenosis. Aortic valve  area, by VTI measures 1.03 cm. Aortic valve mean gradient measures 14.0  mmHg. Aortic valve Vmax measures  2.45 m/s.   5. The inferior vena cava is dilated in size with >50% respiratory  variability, suggesting right atrial pressure of 8 mmHg.   Assessment & Plan  1.  Persistent atrial fibrillation presenting with RVR, likely atypical atrial flutter as well.  CHA2DS2-VASc score is 6.  Focusing on heart rate control at this time with continued medication adjustments and treatment for COPD exacerbation.   2.  HFpEF with RV dysfunction.  Echocardiogram in early October shows LVEF 55 to 60%, moderate RV dysfunction with severe pulmonary hypertension, RVSP estimated 61 mmHg.  Currently on Lasix  40 mg IV daily.  Net urine output 1300 cc last 24 hours.  Creatinine has increased from 1.28-1.35.   3.  Mild to moderate aortic  stenosis, mean AV gradient 14 mmHg.   4.  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   5.  Nonobstructive CAD by cardiac catheterization in 2024.   6.  CKD stage IIIa-b. Current creatinine 1.35 with GFR 54.  Continue Cardizem  CD 120 mg daily.  Increase bisoprolol  to 5 mg twice daily (was dividing dose at home as well).  Continue Xarelto  20 mg daily for stroke prophylaxis.  Change to oral Lasix  40 mg twice daily starting tomorrow.  As discussed in consultation note, holding off on resumption of Tikosyn  until a later date.  For questions or updates, please contact [facility] Please consult [email or web address] for contact info under   Signed, Jayson Sierras, MD  11/02/2024, 11:08 AM

## 2024-11-03 ENCOUNTER — Other Ambulatory Visit: Payer: Self-pay

## 2024-11-03 ENCOUNTER — Other Ambulatory Visit (HOSPITAL_COMMUNITY): Payer: Self-pay

## 2024-11-03 DIAGNOSIS — E1121 Type 2 diabetes mellitus with diabetic nephropathy: Secondary | ICD-10-CM | POA: Diagnosis not present

## 2024-11-03 DIAGNOSIS — J9621 Acute and chronic respiratory failure with hypoxia: Secondary | ICD-10-CM | POA: Diagnosis not present

## 2024-11-03 DIAGNOSIS — E875 Hyperkalemia: Secondary | ICD-10-CM

## 2024-11-03 DIAGNOSIS — Z950 Presence of cardiac pacemaker: Secondary | ICD-10-CM

## 2024-11-03 DIAGNOSIS — I4891 Unspecified atrial fibrillation: Secondary | ICD-10-CM | POA: Diagnosis not present

## 2024-11-03 DIAGNOSIS — J441 Chronic obstructive pulmonary disease with (acute) exacerbation: Secondary | ICD-10-CM | POA: Diagnosis not present

## 2024-11-03 DIAGNOSIS — G4733 Obstructive sleep apnea (adult) (pediatric): Secondary | ICD-10-CM

## 2024-11-03 LAB — CULTURE, RESPIRATORY W GRAM STAIN: Culture: NORMAL

## 2024-11-03 LAB — GLUCOSE, CAPILLARY
Glucose-Capillary: 200 mg/dL — ABNORMAL HIGH (ref 70–99)
Glucose-Capillary: 257 mg/dL — ABNORMAL HIGH (ref 70–99)
Glucose-Capillary: 386 mg/dL — ABNORMAL HIGH (ref 70–99)

## 2024-11-03 LAB — BASIC METABOLIC PANEL WITH GFR
Anion gap: 10 (ref 5–15)
BUN: 34 mg/dL — ABNORMAL HIGH (ref 8–23)
CO2: 27 mmol/L (ref 22–32)
Calcium: 9 mg/dL (ref 8.9–10.3)
Chloride: 102 mmol/L (ref 98–111)
Creatinine, Ser: 1.03 mg/dL (ref 0.61–1.24)
GFR, Estimated: >60 mL/min (ref >60)
Glucose, Bld: 182 mg/dL — ABNORMAL HIGH (ref 70–99)
Potassium: 4.6 mmol/L (ref 3.5–5.1)
Sodium: 139 mmol/L (ref 135–145)

## 2024-11-03 LAB — MAGNESIUM: Magnesium: 2.3 mg/dL (ref 1.7–2.4)

## 2024-11-03 MED ORDER — DOXYCYCLINE HYCLATE 100 MG PO TABS: 100.0000 mg | ORAL_TABLET | Freq: Two times a day (BID) | ORAL | 0 refills | Status: AC

## 2024-11-03 MED ORDER — COLLAGENASE 250 UNIT/GM EX OINT: TOPICAL_OINTMENT | Freq: Every day | CUTANEOUS | 0 refills | Status: DC

## 2024-11-03 MED ORDER — ALBUTEROL SULFATE HFA 108 (90 BASE) MCG/ACT IN AERS: 1.0000 | INHALATION_SPRAY | Freq: Four times a day (QID) | RESPIRATORY_TRACT | Status: DC | PRN

## 2024-11-03 MED ORDER — FUROSEMIDE 40 MG PO TABS: 40.0000 mg | ORAL_TABLET | Freq: Two times a day (BID) | ORAL | Status: DC

## 2024-11-03 MED ORDER — CEFUROXIME AXETIL 500 MG PO TABS: 500.0000 mg | ORAL_TABLET | Freq: Two times a day (BID) | ORAL | 0 refills | Status: AC

## 2024-11-03 MED ORDER — PREDNISONE 20 MG PO TABS: ORAL_TABLET | ORAL | 0 refills | Status: DC

## 2024-11-03 MED ORDER — DM-GUAIFENESIN ER 30-600 MG PO TB12: 1.0000 | ORAL_TABLET | Freq: Two times a day (BID) | ORAL | 0 refills | Status: DC

## 2024-11-03 MED ORDER — PANTOPRAZOLE SODIUM 40 MG PO TBEC: 40.0000 mg | DELAYED_RELEASE_TABLET | Freq: Two times a day (BID) | ORAL | 1 refills | Status: DC

## 2024-11-03 MED ORDER — LOSARTAN POTASSIUM 50 MG PO TABS: 25.0000 mg | ORAL_TABLET | Freq: Every day | ORAL | Status: DC

## 2024-11-03 MED ORDER — BISOPROLOL FUMARATE 5 MG PO TABS: 5.0000 mg | ORAL_TABLET | Freq: Two times a day (BID) | ORAL | 2 refills | Status: DC

## 2024-11-03 NOTE — Plan of Care (Signed)

## 2024-11-03 NOTE — Inpatient Diabetes Management (Addendum)
 Inpatient Diabetes Program Recommendations  AACE/ADA: New Consensus Statement on Inpatient Glycemic Control   Target Ranges:  Prepandial:   less than 140 mg/dL      Peak postprandial:   less than 180 mg/dL (1-2 hours)      Critically ill patients:  140 - 180 mg/dL    Latest Reference Range & Units 11/02/24 07:53 11/02/24 11:23 11/02/24 16:01 11/02/24 21:03 11/03/24 07:58 11/03/24 11:22  Glucose-Capillary 70 - 99 mg/dL 732 (H) 749 (H) 739 (H) 258 (H) 200 (H) 257 (H)    Review of Glycemic Control  Diabetes history: DM2 Outpatient Diabetes medications: Glipizide  5 mg QAM, Jardiance  25 mg QAM, Metformin  XR 500 mg BID, Lantus  10 units QAM Current orders for Inpatient glycemic control: Semglee  35 units daily, Novolog  0-15 units TID with meals, Novolog  0-5 units at bedtime, Novolog  7 units TID with meals; Solumedrol 40 mg daily   Inpatient Diabetes Program Recommendations:     Insulin : If steroids are continued as ordered, please consider increasing Semglee  to 38 units daily and meal coverage to Novolog  10 units TID with meals.  Thanks, Earnie Gainer, RN, MSN, CDCES Diabetes Coordinator Inpatient Diabetes Program [phone] (Team Pager from 8am to 5pm)

## 2024-11-03 NOTE — Progress Notes (Signed)
 Rounding Note   Patient Name: Clarence Dawson Date of Encounter: 11/03/2024  [facility] Cardiologist: Danelle Birmingham, MD   Subjective He reports his SOB is back to baseline.   Scheduled Meds:  albuterol   2.5 mg Nebulization TID   atorvastatin   80 mg Oral Daily   bisoprolol   5 mg Oral BID   Brensocatib  25 mg Oral Daily   budesonide -glycopyrrolate -formoterol   2 puff Inhalation BID   cephALEXin   500 mg Oral Q12H   Chlorhexidine  Gluconate Cloth  6 each Topical Q0600   collagenase   Topical Daily   diltiazem   120 mg Oral Daily   doxycycline   100 mg Oral Q12H   furosemide   40 mg Oral BID   insulin  aspart  0-15 Units Subcutaneous TID WC   insulin  aspart  0-5 Units Subcutaneous QHS   insulin  aspart  7 Units Subcutaneous TID WC   insulin  glargine-yfgn  35 Units Subcutaneous Daily   levothyroxine   150 mcg Oral QAC breakfast   methylPREDNISolone  (SOLU-MEDROL ) injection  40 mg Intravenous Daily   pantoprazole   40 mg Oral Daily   rivaroxaban   20 mg Oral Q supper   Continuous Infusions:  PRN Meds: acetaminophen  **OR** acetaminophen , albuterol , senna   Vital Signs  Vitals:   11/02/24 2108 11/02/24 2109 11/02/24 2300 11/03/24 0525  BP:    133/85  Pulse:    70  Resp:      Temp:      TempSrc:      SpO2: (!) 88% 96% 98% 98%  Weight:      Height:        Intake/Output Summary (Last 24 hours) at 11/03/2024 1022 Last data filed at 11/02/2024 2100 Gross per 24 hour  Intake 240 ml  Output 500 ml  Net -260 ml      10/30/2024   11:57 AM 10/30/2024   10:00 AM 10/30/2024    8:28 AM  Last 3 Weights  Weight (lbs) 207 lb 0.2 oz 210 lb 12.2 oz 210 lb 12.2 oz  Weight (kg) 93.9 kg 95.6 kg 95.6 kg      Telemetry Afib primarily rate controlled - Personally Reviewed  ECG  N/a - Personally Reviewed  Physical Exam  GEN: No acute distress.   Neck: No JVD Cardiac: irreg Respiratory: bilateral wheezing GI: Soft, nontender, non-distended  MS: No edema; No deformity. Neuro:   Nonfocal  Psych: Normal affect   Labs High Sensitivity Troponin:  No results for input(s): TROPONINIHS in the last 720 hours.   Chemistry Recent Labs  Lab 10/30/24 0838 10/31/24 0419 11/01/24 0433 11/02/24 0501  NA 135 136 135 137  K 5.9* 5.1 4.5 5.2*  CL 94* 102 102 104  CO2 19* 22 24 23   GLUCOSE 233* 334* 328* 260*  BUN 33* 34* 38* 38*  CREATININE 1.70* 1.36* 1.28* 1.35*  CALCIUM  8.9 8.6* 8.3* 8.6*  MG  --   --  2.2  --   PROT 6.5 5.6*  --   --   ALBUMIN 4.0 3.4*  --   --   AST 66* 60*  --   --   ALT 76* 105*  --   --   ALKPHOS 78 85  --   --   BILITOT 1.5* 0.7  --   --   GFRNONAA 41* 53* 57* 54*  ANIONGAP 22* 12 9 10     Lipids No results for input(s): CHOL, TRIG, HDL, LABVLDL, LDLCALC, CHOLHDL in the last 168 hours.  Hematology Recent Labs  Lab 10/30/24 0833 10/31/24 0419  WBC 12.0* 7.0  RBC 5.22 4.41  HGB 15.8 13.6  HCT 49.1 40.7  MCV 94.1 92.3  MCH 30.3 30.8  MCHC 32.2 33.4  RDW 15.2 15.1  PLT 171 123*   Thyroid  No results for input(s): TSH, FREET4 in the last 168 hours.  BNP Recent Labs  Lab 10/30/24 0833  PROBNP 9,548.0*    DDimer No results for input(s): DDIMER in the last 168 hours.   Radiology  No results found.    Assessment & Plan  1.Persistent afib with RVR - Recent records reviewed, he was taken off Tikosyn  during admission earlier in October in the setting of QT prolongation with sepsis and AKI. Had planned to be admitted this week for reinitiation of Tikosyn , however now readmitted with acute on chronic hypoxic respiratory failure and COPD exacerbation.  - presented with RVR in setting of COPD exacerbation - focusing on rate control, he is on diltiazem  120mg  daily, bisoprolol  5mg  bid. On xarelto  for stroke prevention - tele shows he is overall rate controlled, continue current meds  2. 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  -exacerbation in setting of afib with RVR - CXR no acute process. proBNP 9548 - I/Os incomplete negative roughly 3.2 L since admission. Weight is pending.  - he is on oral lasix  40mg  bid, transitioned from IV lasix   3.Aortic stenosis - 09/2024 echo AVA VTI 1.03, mean grad 14, DI 0.33, SVI 26.  - essentially moderate paradoxical low flow low gradient aortic stenosis - continue to monitor as outpatient, repeat echo 1 year   4. Sick sinus syndrome - has pacemaker  5. 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 CKD 3A   Ok for discharge from cardiac standpoint, we will sign off inpatient care.    For questions or updates, please contact [facility] Please consult [email or web address] for contact info under       Signed, Alvan Carrier, MD  11/03/2024, 10:22 AM

## 2024-11-03 NOTE — Progress Notes (Signed)
 Mobility Specialist Progress Note:    11/03/24 0915  Mobility  Activity Ambulated with assistance  Level of Assistance Modified independent, requires aide device or extra time  Assistive Device None  Distance Ambulated (ft) 180 ft  Range of Motion/Exercises Active;All extremities  Activity Response Tolerated well  Mobility Referral Yes  Mobility visit 1 Mobility  Mobility Specialist Start Time (ACUTE ONLY) 0915  Mobility Specialist Stop Time (ACUTE ONLY) [id number]  Mobility Specialist Time Calculation (min) (ACUTE ONLY) 24 min   Pt received in bed, agreeable to mobility. ModI to stand and ambulate with no AD. Tolerated well, c/o SOB when sitting down. Left in chair, call bell in reach. All needs met.  Bee Hammerschmidt Mobility Specialist Please contact via Special Educational Needs Teacher or  Rehab office at [phone]

## 2024-11-03 NOTE — Discharge Summary (Signed)
 Physician Discharge Summary   Patient: Clarence Dawson MRN: [id number] DOB: 09-Aug-1946  Admit date:     10/30/2024  Discharge date: 11/03/24  Discharge Physician: Eric Nunnery   PCP: Sheryle Carwin, MD   Recommendations at discharge:  Reassessed patient blood pressure and adjust medication as needed Continue to closely follow CBGs fluctuation and adjust hypoglycemic regimen as required Repeat basic metabolic panel to follow electrolytes and renal function Repeat CBC to follow hemoglobin trend/stability. Repeat chest x-ray 8 and 6 weeks or so to follow complete resolution of infiltrates.  Discharge Diagnoses: Principal Problem:   COPD exacerbation (HCC) Active Problems:   Elevated lactic acid level   Persistent atrial fibrillation   Type 2 diabetes mellitus (HCC)   Pacemaker   OSA on CPAP   Acute exacerbation of COPD with asthma (HCC)   Acute on chronic respiratory failure with hypoxia (HCC)   Atrial fibrillation with rapid ventricular response (HCC)   Acquired hypothyroidism   Acute hyperkalemia  Brief narrative: Clarence Dawson is a 78 y.o. male with medical history significant of atrial fibrillation, COPD on 4 L of pulmonary oxygen  at home, permanent pacemaker, pulmonary hypertension who comes to the ER with complaints of shortness of breath.  He was found to be in respiratory distress, hypoxic by EMS satting 80% on nonrebreather mask.   Patient reports to be starting to feel short of breath yesterday which worsened overnight.  He also reports of some coughing with thick greenish sputum.  He also reports of feeling weak and having chills.  Denies any chest pain.  Reports palpitation.  Recently hospitalized and treated for pneumonia early October.  Has chronic A-fib on anticoagulation.  He was taken off Tikosyn  after recent hospitalization due to prolonged QTc.  Apparently he was planned to reinitiate but is now hospitalized again.   ED Course: In the ER he was found to be wheezy and  in respiratory distress.  He was in A-fib RVR and initiated on diltiazem  drip.  He is also placed on BiPAP with noticeable improvement. Received IV fluid per sepsis protocol, initiated on IV antibiotics in the ER  Laboratory work with sodium 135, potassium 5.9, chloride 94, BUN 33, creatinine 1.70, calcium  8.9, anion gap 22, proBNP 9540, troponin T 59, repeat in 1 hour 53, lactic acid on presentation 8.3, repeat 3.1.  White count 12, hemoglobin 15 platelet 171, INR 2.4, influenza A, influenza B, RSV, COVID-19 negative.   Slowly recovering, back to baseline oxygen .  Cleared by cardiology service for discharge home with adjusted dose of diuretics and GDMT regimen.  Patient will also complete steroids tapering, oral antibiotics, follow-up with PCP in the next 10 days.  Assessment and Plan: Acute on chronic hypoxic respiratory failure COPD exacerbation  -Increasing cough, shortness of breath with purulent sputum. -Initially required BiPAP in the ER but quickly transitioned to 5 L/min nasal cannula prior to transfer to stepdown -Chest x-ray 11/2 with no acute cardiopulmonary processes.  Influenza A, influenza B, COVID-19, RSV negative -Blood culture negative so far, sputum culture is negative for 24 hours. -- Antibiotic has been transition to oral route to complete therapy. - Steroids tapering, mucolytic's, bronchodilator management and continue use of incentive spirometer/flutter valve discussed with patient. - Continue bronchodilator management and steroids at current dose.   Lactic acidosis: Likely from hypoxia versus sepsis.  This has improved.   Chronic atrial fibrillation with RVR - A-fib with RVR, started on Cardizem  drip briefly which was discontinued due to hypotension.  Heart rate  seems to be better.  He is already initiated on home Cardizem .  Also started on low-dose bisoprolol .  Continue Xarelto  - Tikosyn  was recently discontinued due to AKI and prolonged QT with plan to reinitiate in  coming week however will wait until resolution of acute pulm issues. -Cardiology consultation appreciated - Continue adjusted dose of bisoprolol  and Cardizem .   HFpEF, RV dysfunction History of pulmonary hypertension - Per discussion with cardiology service stable to discharge home on adjusted dose of oral diuretics and resumption of his GDMT management. - Continue to follow daily weights and maintain adequate hydration. - Low-sodium diet discussed with patient.  - Outpatient follow-up with cardiology service will be arranged. - Continue to follow electrolytes trend.  Diabetes mellitus type 2: -Elevated CBGs appreciated in the setting of his steroids usage - Will resume home oral hypoglycemic agents and insulin  therapy - Follow-up with PCP to further adjust hypoglycemic regimen based on further CBGs fluctuation.   AKI on CKD/hyperkalemia: Creatinine 1.7 on admission with baseline 1.1-1.2.  Received IV fluid bolus in the ER per sepsis protocol.   -Currently on IV Lasix . - Continue to follow cardiology service recommendation for Lasix  dose.   Lower extremity wound - Right lower extremity wound secondary to trauma.  Does not look infected.   -Continue local wound care   Hypothyroidism -Continue Synthroid .  Class I obesity/obstructive sleep apnea - Low-calorie diet and portion control discussed with patient - Continue the use of CPAP nightly.    Consultants: Cardiology service Procedures performed: See below for x-ray reports. Disposition: Home Diet recommendation: Heart healthy/low-sodium modify carbohydrate diet.  DISCHARGE MEDICATION: Allergies as of 11/03/2024       Reactions   Food Anaphylaxis, Shortness Of Breath   Tree nuts   Iodinated Contrast Media Hives, Shortness Of Breath   Patient states hives to throat closing. (01/15/17: patient states this reaction was about 20 years ago with possibly an IVP.  He now says high doses of prednisone  throw me into AFib.  He has  tolerated CT arthrograms with Benadryl  50mg  PO one hour before injection.  Rudolph Loss, RN)   Shellfish Allergy  Anaphylaxis, Shortness Of Breath   To shellfish, crabs.  Makes him feel like things are crawling all over me.  Denies airway issues with these foods.  Arma Loss, RN 01/15/17)   Goat-derived Products Hives   Goat cheese   Ipratropium-albuterol  Swelling, Other (See Comments)   Tongue swelling   Metformin  And Related Diarrhea   High doses at once   Prednisone  Palpitations   Precipitates A-Fib   Diclofenac Sodium Hives, Other (See Comments)   Hives, buggy feeling all over   Vancomycin  Anxiety, Other (See Comments)   Red man syndrome   Voltaren [diclofenac Sodium] Other (See Comments)   Feels like things are crawling on him        Medication List     STOP taking these medications    cephALEXin  500 MG capsule Commonly known as: KEFLEX        TAKE these medications    acetaminophen  500 MG tablet Commonly known as: TYLENOL  Take 1,000 mg by mouth 3 (three) times daily. What changed:  how much to take when to take this   albuterol  (2.5 MG/3ML) 0.083% nebulizer solution Commonly known as: PROVENTIL  INHALE ONE VIAL VIA NEBULIZER EVERY 6 HOURS AS NEEDED FOR WHEEZING OR SHORTNESS OF BREATH What changed: Another medication with the same name was changed. Make sure you understand how and when to take each.   albuterol   108 (90 Base) MCG/ACT inhaler Commonly known as: VENTOLIN  HFA Inhale 1-2 puffs into the lungs every 6 (six) hours as needed for wheezing or shortness of breath (if this medication failed, then use nebulizer management as prescribed.). What changed: See the new instructions.   atorvastatin  80 MG tablet Commonly known as: LIPITOR Take 1 tablet (80 mg total) by mouth daily.   bisoprolol  5 MG tablet Commonly known as: ZEBETA  Take 1 tablet (5 mg total) by mouth 2 (two) times daily. What changed: See the new instructions.   Breztri  Aerosphere  160-9-4.8 MCG/ACT Aero inhaler Generic drug: budesonide -glycopyrrolate -formoterol  INHALE 2 PUFFS INTO THE LUNGS TWICE DAILY.   Brinsupri 25 MG Tabs Generic drug: Brensocatib Take 25 mg by mouth daily.   cefUROXime  500 MG tablet Commonly known as: CEFTIN  Take 1 tablet (500 mg total) by mouth 2 (two) times daily with a meal for 3 days.   chlorhexidine  4 % external liquid Commonly known as: Hibiclens  Apply topically daily as needed.   collagenase 250 UNIT/GM ointment Commonly known as: SANTYL Apply topically daily. Apply to R lower leg wound daily; Apply 1/4 thick layer of Santyl to wound bed, top with saline moist gauze (see wound care order) Start taking on: November 04, 2024   dextromethorphan -guaiFENesin  30-600 MG 12hr tablet Commonly known as: MUCINEX  DM Take 1 tablet by mouth 2 (two) times daily for 10 days.   diltiazem  120 MG 24 hr capsule Commonly known as: CARDIZEM  CD TAKE (1) CAPSULE BY MOUTH DAILY, MAY TAKE A EXTRA CAPSULE DAILY FOR BREAKTHROUGH AFIB.   doxycycline  100 MG tablet Commonly known as: VIBRA -TABS Take 1 tablet (100 mg total) by mouth every 12 (twelve) hours for 3 days.   Dupixent  300 MG/2ML Soaj Generic drug: Dupilumab  Inject 300 mg into the skin every 14 (fourteen) days.   EPINEPHrine  0.3 mg/0.3 mL Soaj injection Commonly known as: EPI-PEN Inject 0.3 mg into the muscle as needed for anaphylaxis.   ezetimibe  10 MG tablet Commonly known as: ZETIA  Take 1 tablet (10 mg total) by mouth daily.   fluticasone  50 MCG/ACT nasal spray Commonly known as: FLONASE  Place 2 sprays into both nostrils daily.   Flutter Devi Use as directed   furosemide  40 MG tablet Commonly known as: LASIX  Take 1 tablet (40 mg total) by mouth 2 (two) times daily. What changed: how much to take   glipiZIDE  5 MG 24 hr tablet Commonly known as: GLUCOTROL  XL Take 5 mg by mouth daily before breakfast.   Jardiance  25 MG Tabs tablet Generic drug: empagliflozin  Take 25 mg by  mouth daily before breakfast.   Lantus  100 UNIT/ML injection Generic drug: insulin  glargine Inject 10 Units into the skin every morning.   levothyroxine  150 MCG tablet Commonly known as: SYNTHROID  Take 150 mcg by mouth daily before breakfast.   losartan  50 MG tablet Commonly known as: COZAAR  Take 0.5 tablets (25 mg total) by mouth at bedtime. What changed: how much to take   metFORMIN  500 MG 24 hr tablet Commonly known as: GLUCOPHAGE -XR Take 500 mg by mouth in the morning and at bedtime.   multivitamin tablet Take 1 tablet by mouth daily.   mupirocin  ointment 2 % Commonly known as: BACTROBAN  Apply 1 Application topically daily.   Ohtuvayre  3 MG/2.5ML Susp Generic drug: Ensifentrine  Take 3 mg by nebulization 2 (two) times daily.   omeprazole  20 MG tablet Commonly known as: PRILOSEC  OTC Take 20 mg by mouth every morning.   pantoprazole  40 MG tablet Commonly known as: PROTONIX   Take 1 tablet (40 mg total) by mouth 2 (two) times daily.   Pari LC Plus Nebulizer Misc Use with nebulized medications as directed. Replace tubing every 6 months.   predniSONE  20 MG tablet Commonly known as: DELTASONE  Take 3 tablets by mouth daily x 1 day; then 2 tablets by mouth daily x 2 days; then 1 tablet by mouth daily x 3 days; then half tablet by mouth daily x 3 days and stop prednisone .   PRESCRIPTION MEDICATION 2 (two) times daily. Thumper vest   rivaroxaban  20 MG Tabs tablet Commonly known as: XARELTO  Take 20 mg by mouth at bedtime.   spironolactone  25 MG tablet Commonly known as: ALDACTONE  Take 0.5 tablets (12.5 mg total) by mouth daily.               Discharge Care Instructions  (From admission, onward)           Start     Ordered   11/03/24 0000  Discharge wound care:       Comments: Cleanse R lower leg wound with Vashe wound cleanser, do not rinse and allow to air dry.  Apply 1/4 thick layer of Santyl to wound bed, top with saline moist gauze, dry gauze and  silicone foam or Kerlix roll gauze whichever is preferred/works best   11/03/24 1634            Follow-up Information     Sheryle Carwin, MD. Schedule an appointment as soon as possible for a visit in 10 day(s).   Specialty: Internal Medicine Contact information: [address] [place] [address] [phone]                Discharge Exam: Fredricka Weights   10/30/24 0828 10/30/24 1000 10/30/24 1157  Weight: 95.6 kg 95.6 kg 93.9 kg   General exam: Alert, awake, oriented x 3; following commands appropriately and able to speak in full sentences.  Good saturation on chronic supplementation appreciated. Respiratory system: Pulse scattered rhonchi; no using accessory muscle.  Mild expiratory wheezing and no frank crackles appreciated on exam. Cardiovascular system: Rate controlled, no rubs, no gallops, no JVD. Gastrointestinal system: Abdomen is nondistended, soft and nontender. No organomegaly or masses felt. Normal bowel sounds heard. Central nervous system: No focal neurological deficits. Extremities: No cyanosis or clubbing. Skin: No petechiae; chronic right lower extremity wound appreciated at time of admission without signs of superimposed infection. Psychiatry: Judgement and insight appear normal. Mood & affect appropriate.    Condition at discharge: Stable and improved.  The results of significant diagnostics from this hospitalization (including imaging, microbiology, ancillary and laboratory) are listed below for reference.   Imaging Studies: DG Chest Port 1 View Result Date: 10/30/2024 EXAM: 1 VIEW XRAY OF THE CHEST 10/30/2024 08:55:38 AM COMPARISON: None available. CLINICAL HISTORY: Shob FINDINGS: LUNGS AND PLEURA: No focal pulmonary opacity. No pulmonary edema. No pleural effusion. No pneumothorax. Emphysema again noted. HEART AND MEDIASTINUM: Left pacemaker in place. No acute abnormality of the cardiac and mediastinal silhouettes. BONES AND SOFT TISSUES: No  acute osseous abnormality. IMPRESSION: 1. No acute cardiopulmonary process. 2. Emphysema. Electronically signed by: Norleen Kil MD 10/30/2024 09:18 AM EST RP Workstation: [id number]   CT CHEST WO CONTRAST Result Date: 10/19/2024 CLINICAL DATA:  Hemoptysis, asthma, COPD. EXAM: CT CHEST WITHOUT CONTRAST TECHNIQUE: Multidetector CT imaging of the chest was performed following the standard protocol without IV contrast. RADIATION DOSE REDUCTION: This exam was performed according to the departmental dose-optimization program which includes automated exposure control,  adjustment of the mA and/or kV according to patient size and/or use of iterative reconstruction technique. COMPARISON:  04/09/2023. FINDINGS: Cardiovascular: Atherosclerotic calcification of the aorta, aortic valve and coronary arteries. Heart is at the upper limits of normal in size to mildly enlarged. No pericardial effusion. Mediastinum/Nodes: Thoracic inlet lymph nodes are not enlarged by CT size criteria. Mediastinal lymph nodes have decreased in size in the interval, now measuring up to 10 mm in the AP window, previously 2.1 cm. Hilar regions are difficult to definitively evaluate without IV contrast. No axillary adenopathy. Esophagus is grossly unremarkable. Lungs/Pleura: Centrilobular and paraseptal emphysema. Calcified granulomas. Scattered bibasilar scarring. No suspicious pulmonary nodules. Chronic near total left lower lobe collapse/consolidation. No pleural fluid. Airway is unremarkable. Upper Abdomen: Left hepatic lobe hypertrophy with slight marginal irregularity of the liver. Visualized portions of the liver, adrenal glands, kidneys, spleen, pancreas, stomach and bowel are otherwise grossly unremarkable. No upper abdominal adenopathy. Musculoskeletal: Degenerative changes in the spine. Flowing anterior osteophytosis in the thoracic spine. IMPRESSION: 1. No acute findings. 2. Chronic near total left lower lobe atelectasis. 3. Improved  mediastinal adenopathy. 4. Liver appears cirrhotic. 5. Aortic atherosclerosis (ICD10-I70.0). Coronary artery calcification. 6.  Emphysema (ICD10-J43.9). Electronically Signed   By: Newell Eke M.D.   On: 10/19/2024 16:28    Microbiology: Results for orders placed or performed during the hospital encounter of 10/30/24  Resp panel by RT-PCR (RSV, Flu A&B, Covid) Anterior Nasal Swab     Status: None   Collection Time: 10/30/24  8:33 AM   Specimen: Anterior Nasal Swab  Result Value Ref Range Status   SARS Coronavirus 2 by RT PCR NEGATIVE NEGATIVE Final    Comment: (NOTE) SARS-CoV-2 target nucleic acids are NOT DETECTED.  The SARS-CoV-2 RNA is generally detectable in upper respiratory specimens during the acute phase of infection. The lowest concentration of SARS-CoV-2 viral copies this assay can detect is 138 copies/mL. A negative result does not preclude SARS-Cov-2 infection and should not be used as the sole basis for treatment or other patient management decisions. A negative result may occur with  improper specimen collection/handling, submission of specimen other than nasopharyngeal swab, presence of viral mutation(s) within the areas targeted by this assay, and inadequate number of viral copies(<138 copies/mL). A negative result must be combined with clinical observations, patient history, and epidemiological information. The expected result is Negative.  Fact Sheet for Patients:  bloggercourse.com  Fact Sheet for Healthcare Providers:  seriousbroker.it  This test is no t yet approved or cleared by the [place]  FDA and  has been authorized for detection and/or diagnosis of SARS-CoV-2 by FDA under an Emergency Use Authorization (EUA). This EUA will remain  in effect (meaning this test can be used) for the duration of the COVID-19 declaration under Section 564(b)(1) of the Act, 21 U.S.C.section 360bbb-3(b)(1), unless the  authorization is terminated  or revoked sooner.       Influenza A by PCR NEGATIVE NEGATIVE Final   Influenza B by PCR NEGATIVE NEGATIVE Final    Comment: (NOTE) The Xpert Xpress SARS-CoV-2/FLU/RSV plus assay is intended as an aid in the diagnosis of influenza from Nasopharyngeal swab specimens and should not be used as a sole basis for treatment. Nasal washings and aspirates are unacceptable for Xpert Xpress SARS-CoV-2/FLU/RSV testing.  Fact Sheet for Patients: bloggercourse.com  Fact Sheet for Healthcare Providers: seriousbroker.it  This test is not yet approved or cleared by the [place]  FDA and has been authorized for detection and/or diagnosis of SARS-CoV-2  by FDA under an Emergency Use Authorization (EUA). This EUA will remain in effect (meaning this test can be used) for the duration of the COVID-19 declaration under Section 564(b)(1) of the Act, 21 U.S.C. section 360bbb-3(b)(1), unless the authorization is terminated or revoked.     Resp Syncytial Virus by PCR NEGATIVE NEGATIVE Final    Comment: (NOTE) Fact Sheet for Patients: bloggercourse.com  Fact Sheet for Healthcare Providers: seriousbroker.it  This test is not yet approved or cleared by the [place]  FDA and has been authorized for detection and/or diagnosis of SARS-CoV-2 by FDA under an Emergency Use Authorization (EUA). This EUA will remain in effect (meaning this test can be used) for the duration of the COVID-19 declaration under Section 564(b)(1) of the Act, 21 U.S.C. section 360bbb-3(b)(1), unless the authorization is terminated or revoked.  Performed at [facility], [address], [place], [place] [address]   Blood Culture (routine x 2)     Status: None (Preliminary result)   Collection Time: 10/30/24  8:51 AM   Specimen: BLOOD RIGHT ARM  Result Value Ref Range Status   Specimen Description    Final    BLOOD RIGHT ARM BOTTLES DRAWN AEROBIC AND ANAEROBIC   Special Requests Blood Culture adequate volume  Final   Culture   Final    NO GROWTH 4 DAYS Performed at [facility], [address], [place], [place] [address]    Report Status PENDING  Incomplete  Blood Culture (routine x 2)     Status: None (Preliminary result)   Collection Time: 10/30/24  8:51 AM   Specimen: BLOOD LEFT ARM  Result Value Ref Range Status   Specimen Description BLOOD LEFT ARM BOTTLES DRAWN AEROBIC AND ANAEROBIC  Final   Special Requests Blood Culture adequate volume  Final   Culture   Final    NO GROWTH 4 DAYS Performed at [facility], [address], [place], [place] [address]    Report Status PENDING  Incomplete  MRSA Next Gen by PCR, Nasal     Status: None   Collection Time: 10/30/24 11:06 AM   Specimen: Nasal Mucosa; Nasal Swab  Result Value Ref Range Status   MRSA by PCR Next Gen NOT DETECTED NOT DETECTED Final    Comment: (NOTE) The GeneXpert MRSA Assay (FDA approved for NASAL specimens only), is one component of a comprehensive MRSA colonization surveillance program. It is not intended to diagnose MRSA infection nor to guide or monitor treatment for MRSA infections. Test performance is not FDA approved in patients less than 24 years old. Performed at [facility], [address], [place], [place] [address]   Expectorated Sputum Assessment w Gram Stain, Rflx to Resp Cult     Status: None   Collection Time: 10/31/24  1:53 PM   Specimen: Expectorated Sputum  Result Value Ref Range Status   Specimen Description EXPECTORATED SPUTUM  Final   Special Requests NONE  Final   Sputum evaluation   Final    THIS SPECIMEN IS ACCEPTABLE FOR SPUTUM CULTURE Performed at [facility], [address], [place], [place] [address]    Report Status 10/31/2024 FINAL  Final  Culture, Respiratory w Gram Stain     Status: None   Collection Time: 10/31/24  1:53 PM  Result Value Ref Range Status   Specimen  Description   Final    EXPECTORATED SPUTUM Performed at [facility], [address], [place], [place] [address]    Special Requests   Final  NONE Reflexed from [id number] Performed at [facility], [address] [place], [place] [address]    Gram Stain   Final    FEW WBC PRESENT, PREDOMINANTLY PMN MODERATE GRAM NEGATIVE RODS FEW GRAM POSITIVE COCCI    Culture   Final    MODERATE Normal respiratory flora-no Staph aureus or Pseudomonas seen Performed at [facility], [address] [place], [place] [address]    Report Status 11/03/2024 FINAL  Final   *Note: Due to a large number of results and/or encounters for the requested time period, some results have not been displayed. A complete set of results can be found in Results Review.    Labs: CBC: Recent Labs  Lab 10/30/24 0833 10/31/24 0419  WBC 12.0* 7.0  NEUTROABS 7.3  --   HGB 15.8 13.6  HCT 49.1 40.7  MCV 94.1 92.3  PLT 171 123*   Basic Metabolic Panel: Recent Labs  Lab 10/30/24 0838 10/31/24 0419 11/01/24 0433 11/02/24 0501 11/03/24 1109  NA 135 136 135 137 139  K 5.9* 5.1 4.5 5.2* 4.6  CL 94* 102 102 104 102  CO2 19* 22 24 23 27   GLUCOSE 233* 334* 328* 260* 182*  BUN 33* 34* 38* 38* 34*  CREATININE 1.70* 1.36* 1.28* 1.35* 1.03  CALCIUM  8.9 8.6* 8.3* 8.6* 9.0  MG  --   --  2.2  --  2.3   Liver Function Tests: Recent Labs  Lab 10/30/24 0838 10/31/24 0419  AST 66* 60*  ALT 76* 105*  ALKPHOS 78 85  BILITOT 1.5* 0.7  PROT 6.5 5.6*  ALBUMIN 4.0 3.4*   CBG: Recent Labs  Lab 11/02/24 1601 11/02/24 2103 11/03/24 0758 11/03/24 1122 11/03/24 1602  GLUCAP 260* 258* 200* 257* 386*    Discharge time spent:  35 minutes.  Signed: Eric Nunnery, MD [facility] 11/03/2024

## 2024-11-04 ENCOUNTER — Other Ambulatory Visit: Payer: Self-pay | Admitting: Pharmacy Technician

## 2024-11-04 ENCOUNTER — Other Ambulatory Visit: Payer: Self-pay

## 2024-11-04 ENCOUNTER — Encounter (INDEPENDENT_AMBULATORY_CARE_PROVIDER_SITE_OTHER): Payer: Self-pay

## 2024-11-04 ENCOUNTER — Other Ambulatory Visit (HOSPITAL_COMMUNITY): Payer: Self-pay

## 2024-11-04 LAB — CULTURE, BLOOD (ROUTINE X 2)
Culture: NO GROWTH
Culture: NO GROWTH
Special Requests: ADEQUATE
Special Requests: ADEQUATE

## 2024-11-04 MED FILL — Albuterol Sulfate Soln Nebu 0.083% (2.5 MG/3ML): RESPIRATORY_TRACT | Qty: 3 | Status: AC

## 2024-11-04 NOTE — Progress Notes (Signed)
 Specialty Pharmacy Refill Coordination Note  Clarence Dawson is a 78 y.o. male contacted today regarding refills of specialty medication(s) Dupilumab  (Dupixent )   Patient requested (Patient-Rptd) Pickup at [facility] date: (Patient-Rptd) 11/11/24   Medication will be filled on: 11/10/2024

## 2024-11-06 ENCOUNTER — Other Ambulatory Visit: Payer: Self-pay | Admitting: Pulmonary Disease

## 2024-11-07 ENCOUNTER — Telehealth (HOSPITAL_COMMUNITY): Payer: Self-pay

## 2024-11-07 NOTE — Telephone Encounter (Signed)
 Patient son states patient passed away on 11-Dec-2024 2024/11/27.

## 2024-11-08 ENCOUNTER — Ambulatory Visit: Admitting: Pulmonary Disease

## 2024-11-08 ENCOUNTER — Other Ambulatory Visit: Payer: Self-pay

## 2024-11-08 ENCOUNTER — Ambulatory Visit (HOSPITAL_COMMUNITY)

## 2024-11-21 ENCOUNTER — Encounter: Admitting: Internal Medicine

## 2024-11-28 DEATH — deceased
